# Patient Record
Sex: Male | Born: 1950
Health system: Southern US, Community
[De-identification: ages and names within clinical notes are randomized; demographics above are authoritative.]

## PROBLEM LIST (undated history)

## (undated) DIAGNOSIS — I890 Lymphedema, not elsewhere classified: Secondary | ICD-10-CM

## (undated) DIAGNOSIS — N189 Chronic kidney disease, unspecified: Secondary | ICD-10-CM

## (undated) DIAGNOSIS — J45909 Unspecified asthma, uncomplicated: Secondary | ICD-10-CM

## (undated) DIAGNOSIS — N289 Disorder of kidney and ureter, unspecified: Secondary | ICD-10-CM

## (undated) DIAGNOSIS — Z8719 Personal history of other diseases of the digestive system: Secondary | ICD-10-CM

## (undated) DIAGNOSIS — I739 Peripheral vascular disease, unspecified: Secondary | ICD-10-CM

## (undated) DIAGNOSIS — I499 Cardiac arrhythmia, unspecified: Secondary | ICD-10-CM

## (undated) DIAGNOSIS — I10 Essential (primary) hypertension: Secondary | ICD-10-CM

## (undated) DIAGNOSIS — J90 Pleural effusion, not elsewhere classified: Secondary | ICD-10-CM

## (undated) DIAGNOSIS — I5032 Chronic diastolic (congestive) heart failure: Secondary | ICD-10-CM

## (undated) DIAGNOSIS — K219 Gastro-esophageal reflux disease without esophagitis: Secondary | ICD-10-CM

## (undated) DIAGNOSIS — Z87442 Personal history of urinary calculi: Secondary | ICD-10-CM

## (undated) DIAGNOSIS — K429 Umbilical hernia without obstruction or gangrene: Secondary | ICD-10-CM

## (undated) DIAGNOSIS — M1990 Unspecified osteoarthritis, unspecified site: Secondary | ICD-10-CM

## (undated) DIAGNOSIS — I872 Venous insufficiency (chronic) (peripheral): Secondary | ICD-10-CM

## (undated) DIAGNOSIS — N3020 Other chronic cystitis without hematuria: Secondary | ICD-10-CM

## (undated) DIAGNOSIS — E785 Hyperlipidemia, unspecified: Secondary | ICD-10-CM

## (undated) HISTORY — DX: Umbilical hernia without obstruction or gangrene: K42.9

## (undated) HISTORY — DX: Essential (primary) hypertension: I10

## (undated) HISTORY — DX: Other chronic cystitis without hematuria: N30.20

## (undated) HISTORY — DX: Peripheral vascular disease, unspecified: I73.9

## (undated) HISTORY — PX: COLONOSCOPY: SHX174

## (undated) HISTORY — DX: Personal history of urinary calculi: Z87.442

## (undated) HISTORY — DX: Hyperlipidemia, unspecified: E78.5

## (undated) HISTORY — DX: Venous insufficiency (chronic) (peripheral): I87.2

## (undated) HISTORY — DX: Chronic diastolic (congestive) heart failure: I50.32

## (undated) HISTORY — DX: Chronic kidney disease, unspecified: N18.9

## (undated) HISTORY — PX: HERNIA REPAIR: SHX51

## (undated) HISTORY — DX: Lymphedema, not elsewhere classified: I89.0

## (undated) HISTORY — PX: OTHER SURGICAL HISTORY: SHX169

## (undated) HISTORY — PX: UMBILICAL HERNIA REPAIR: SHX196

## (undated) HISTORY — DX: Unspecified osteoarthritis, unspecified site: M19.90

## (undated) HISTORY — DX: Morbid (severe) obesity due to excess calories: E66.01

## (undated) HISTORY — PX: AMPUTATION TOE: SHX6595

## (undated) HISTORY — PX: EYE SURGERY: SHX253

## (undated) HISTORY — PX: AMPUTATION: SHX166

---

## 2002-06-01 ENCOUNTER — Emergency Department (HOSPITAL_COMMUNITY): Admission: EM | Admit: 2002-06-01 | Discharge: 2002-06-01 | Payer: Self-pay | Admitting: Emergency Medicine

## 2004-12-29 ENCOUNTER — Ambulatory Visit: Payer: Self-pay | Admitting: Sports Medicine

## 2004-12-29 ENCOUNTER — Inpatient Hospital Stay (HOSPITAL_COMMUNITY): Admission: EM | Admit: 2004-12-29 | Discharge: 2005-01-05 | Payer: Self-pay | Admitting: Emergency Medicine

## 2005-01-06 ENCOUNTER — Encounter (HOSPITAL_COMMUNITY): Admission: RE | Admit: 2005-01-06 | Discharge: 2005-04-06 | Payer: Self-pay | Admitting: Internal Medicine

## 2005-01-10 ENCOUNTER — Ambulatory Visit: Payer: Self-pay | Admitting: Sports Medicine

## 2005-02-01 ENCOUNTER — Ambulatory Visit: Payer: Self-pay | Admitting: Sports Medicine

## 2005-02-17 ENCOUNTER — Ambulatory Visit: Payer: Self-pay | Admitting: Family Medicine

## 2005-03-02 ENCOUNTER — Encounter (HOSPITAL_BASED_OUTPATIENT_CLINIC_OR_DEPARTMENT_OTHER): Admission: RE | Admit: 2005-03-02 | Discharge: 2005-03-11 | Payer: Self-pay | Admitting: Internal Medicine

## 2005-03-09 ENCOUNTER — Ambulatory Visit (HOSPITAL_COMMUNITY): Admission: RE | Admit: 2005-03-09 | Discharge: 2005-03-09 | Payer: Self-pay | Admitting: Specialist

## 2005-03-14 ENCOUNTER — Ambulatory Visit: Payer: Self-pay | Admitting: Sports Medicine

## 2005-04-12 ENCOUNTER — Encounter (HOSPITAL_BASED_OUTPATIENT_CLINIC_OR_DEPARTMENT_OTHER): Admission: RE | Admit: 2005-04-12 | Discharge: 2005-07-11 | Payer: Self-pay | Admitting: Surgery

## 2005-04-19 ENCOUNTER — Ambulatory Visit: Payer: Self-pay | Admitting: Sports Medicine

## 2005-04-20 ENCOUNTER — Inpatient Hospital Stay (HOSPITAL_COMMUNITY): Admission: RE | Admit: 2005-04-20 | Discharge: 2005-04-22 | Payer: Self-pay | Admitting: Specialist

## 2005-04-20 ENCOUNTER — Encounter (INDEPENDENT_AMBULATORY_CARE_PROVIDER_SITE_OTHER): Payer: Self-pay | Admitting: Specialist

## 2005-04-20 ENCOUNTER — Ambulatory Visit: Payer: Self-pay | Admitting: Infectious Diseases

## 2005-05-17 ENCOUNTER — Ambulatory Visit (HOSPITAL_COMMUNITY): Admission: RE | Admit: 2005-05-17 | Discharge: 2005-05-17 | Payer: Self-pay | Admitting: Vascular Surgery

## 2005-05-23 ENCOUNTER — Encounter (HOSPITAL_COMMUNITY): Admission: RE | Admit: 2005-05-23 | Discharge: 2005-08-21 | Payer: Self-pay | Admitting: Cardiovascular Disease

## 2005-05-24 ENCOUNTER — Ambulatory Visit: Payer: Self-pay | Admitting: Sports Medicine

## 2005-05-26 ENCOUNTER — Inpatient Hospital Stay (HOSPITAL_COMMUNITY): Admission: RE | Admit: 2005-05-26 | Discharge: 2005-06-02 | Payer: Self-pay | Admitting: Vascular Surgery

## 2005-07-05 ENCOUNTER — Ambulatory Visit: Payer: Self-pay | Admitting: Family Medicine

## 2005-08-01 ENCOUNTER — Ambulatory Visit: Payer: Self-pay | Admitting: Sports Medicine

## 2005-09-20 ENCOUNTER — Ambulatory Visit (HOSPITAL_COMMUNITY): Admission: RE | Admit: 2005-09-20 | Discharge: 2005-09-20 | Payer: Self-pay | Admitting: Vascular Surgery

## 2005-11-07 ENCOUNTER — Ambulatory Visit: Payer: Self-pay | Admitting: Sports Medicine

## 2005-12-07 ENCOUNTER — Ambulatory Visit: Payer: Self-pay | Admitting: Family Medicine

## 2006-03-01 ENCOUNTER — Ambulatory Visit: Payer: Self-pay | Admitting: Family Medicine

## 2006-03-02 ENCOUNTER — Ambulatory Visit (HOSPITAL_COMMUNITY): Admission: RE | Admit: 2006-03-02 | Discharge: 2006-03-02 | Payer: Self-pay | Admitting: Family Medicine

## 2006-03-02 ENCOUNTER — Encounter (INDEPENDENT_AMBULATORY_CARE_PROVIDER_SITE_OTHER): Payer: Self-pay | Admitting: *Deleted

## 2006-04-03 ENCOUNTER — Ambulatory Visit: Payer: Self-pay | Admitting: Family Medicine

## 2006-04-04 ENCOUNTER — Ambulatory Visit: Payer: Self-pay | Admitting: Family Medicine

## 2006-05-02 ENCOUNTER — Ambulatory Visit: Payer: Self-pay | Admitting: Sports Medicine

## 2006-07-06 ENCOUNTER — Ambulatory Visit: Payer: Self-pay | Admitting: Sports Medicine

## 2006-08-03 ENCOUNTER — Ambulatory Visit: Payer: Self-pay | Admitting: Family Medicine

## 2007-02-08 DIAGNOSIS — Z6835 Body mass index (BMI) 35.0-35.9, adult: Secondary | ICD-10-CM

## 2007-02-08 DIAGNOSIS — K429 Umbilical hernia without obstruction or gangrene: Secondary | ICD-10-CM | POA: Insufficient documentation

## 2007-02-08 DIAGNOSIS — I70219 Atherosclerosis of native arteries of extremities with intermittent claudication, unspecified extremity: Secondary | ICD-10-CM

## 2007-02-08 DIAGNOSIS — M869 Osteomyelitis, unspecified: Secondary | ICD-10-CM | POA: Insufficient documentation

## 2007-02-08 DIAGNOSIS — E66812 Obesity, class 2: Secondary | ICD-10-CM | POA: Insufficient documentation

## 2007-02-08 DIAGNOSIS — I10 Essential (primary) hypertension: Secondary | ICD-10-CM

## 2007-02-08 DIAGNOSIS — E118 Type 2 diabetes mellitus with unspecified complications: Secondary | ICD-10-CM

## 2007-02-08 HISTORY — DX: Umbilical hernia without obstruction or gangrene: K42.9

## 2007-02-23 ENCOUNTER — Telehealth: Payer: Self-pay | Admitting: *Deleted

## 2007-03-19 ENCOUNTER — Ambulatory Visit: Payer: Self-pay | Admitting: Family Medicine

## 2007-03-19 DIAGNOSIS — E785 Hyperlipidemia, unspecified: Secondary | ICD-10-CM

## 2007-03-23 ENCOUNTER — Telehealth (INDEPENDENT_AMBULATORY_CARE_PROVIDER_SITE_OTHER): Payer: Self-pay | Admitting: Family Medicine

## 2007-04-13 ENCOUNTER — Ambulatory Visit: Payer: Self-pay | Admitting: Family Medicine

## 2007-04-13 ENCOUNTER — Encounter (INDEPENDENT_AMBULATORY_CARE_PROVIDER_SITE_OTHER): Payer: Self-pay | Admitting: Family Medicine

## 2007-04-13 DIAGNOSIS — I872 Venous insufficiency (chronic) (peripheral): Secondary | ICD-10-CM | POA: Insufficient documentation

## 2007-04-13 LAB — CONVERTED CEMR LAB
Bilirubin Urine: NEGATIVE
Calcium: 9.3 mg/dL (ref 8.4–10.5)
Ketones, urine, test strip: NEGATIVE
Nitrite: NEGATIVE
Pro B Natriuretic peptide (BNP): 21 pg/mL (ref 0.0–100.0)
Protein, U semiquant: NEGATIVE
Sodium: 140 meq/L (ref 135–145)
Urobilinogen, UA: 0.2

## 2007-04-17 ENCOUNTER — Telehealth: Payer: Self-pay | Admitting: *Deleted

## 2007-04-18 ENCOUNTER — Ambulatory Visit (HOSPITAL_COMMUNITY): Admission: RE | Admit: 2007-04-18 | Discharge: 2007-04-18 | Payer: Self-pay | Admitting: Sports Medicine

## 2007-04-18 ENCOUNTER — Encounter: Payer: Self-pay | Admitting: Cardiology

## 2007-04-18 ENCOUNTER — Ambulatory Visit: Payer: Self-pay | Admitting: Cardiology

## 2007-05-15 ENCOUNTER — Ambulatory Visit (HOSPITAL_BASED_OUTPATIENT_CLINIC_OR_DEPARTMENT_OTHER): Admission: RE | Admit: 2007-05-15 | Discharge: 2007-05-15 | Payer: Self-pay | Admitting: Urology

## 2007-05-18 ENCOUNTER — Encounter: Admission: RE | Admit: 2007-05-18 | Discharge: 2007-05-18 | Payer: Self-pay | Admitting: Sports Medicine

## 2007-05-18 ENCOUNTER — Ambulatory Visit: Payer: Self-pay | Admitting: Sports Medicine

## 2007-05-18 ENCOUNTER — Encounter (INDEPENDENT_AMBULATORY_CARE_PROVIDER_SITE_OTHER): Payer: Self-pay | Admitting: Family Medicine

## 2007-05-18 LAB — CONVERTED CEMR LAB
Calcium, Total (PTH): 9 mg/dL (ref 8.4–10.5)
Chloride: 107 meq/L (ref 96–112)
Magnesium: 1.9 mg/dL (ref 1.5–2.5)
PTH: 47.5 pg/mL (ref 14.0–72.0)
Potassium: 4.3 meq/L (ref 3.5–5.3)
Sodium: 142 meq/L (ref 135–145)

## 2007-05-21 ENCOUNTER — Encounter (INDEPENDENT_AMBULATORY_CARE_PROVIDER_SITE_OTHER): Payer: Self-pay | Admitting: Family Medicine

## 2007-05-21 LAB — CONVERTED CEMR LAB
Collection Interval-CRCL: 24 hr
Creatinine, Urine: 137.2 mg/dL

## 2007-06-01 ENCOUNTER — Encounter (INDEPENDENT_AMBULATORY_CARE_PROVIDER_SITE_OTHER): Payer: Self-pay | Admitting: Family Medicine

## 2007-06-11 ENCOUNTER — Ambulatory Visit: Payer: Self-pay | Admitting: *Deleted

## 2007-06-21 ENCOUNTER — Ambulatory Visit: Payer: Self-pay | Admitting: Vascular Surgery

## 2007-09-18 ENCOUNTER — Encounter (INDEPENDENT_AMBULATORY_CARE_PROVIDER_SITE_OTHER): Payer: Self-pay | Admitting: Family Medicine

## 2007-09-27 ENCOUNTER — Encounter (INDEPENDENT_AMBULATORY_CARE_PROVIDER_SITE_OTHER): Payer: Self-pay | Admitting: Family Medicine

## 2007-11-28 ENCOUNTER — Ambulatory Visit: Payer: Self-pay | Admitting: Vascular Surgery

## 2008-02-11 ENCOUNTER — Ambulatory Visit: Payer: Self-pay | Admitting: Vascular Surgery

## 2008-05-06 ENCOUNTER — Encounter: Payer: Self-pay | Admitting: *Deleted

## 2008-05-14 ENCOUNTER — Ambulatory Visit: Payer: Self-pay | Admitting: Vascular Surgery

## 2008-05-21 ENCOUNTER — Ambulatory Visit: Payer: Self-pay | Admitting: Family Medicine

## 2008-05-21 ENCOUNTER — Ambulatory Visit: Payer: Self-pay | Admitting: Vascular Surgery

## 2008-05-21 ENCOUNTER — Encounter: Payer: Self-pay | Admitting: Family Medicine

## 2008-05-21 LAB — CONVERTED CEMR LAB: Hgb A1c MFr Bld: 8 %

## 2008-06-02 ENCOUNTER — Encounter: Admission: RE | Admit: 2008-06-02 | Discharge: 2008-06-02 | Payer: Self-pay | Admitting: Family Medicine

## 2008-06-05 ENCOUNTER — Encounter: Payer: Self-pay | Admitting: Family Medicine

## 2008-06-09 ENCOUNTER — Encounter: Payer: Self-pay | Admitting: Family Medicine

## 2008-06-09 DIAGNOSIS — N186 End stage renal disease: Secondary | ICD-10-CM | POA: Insufficient documentation

## 2008-06-09 DIAGNOSIS — Z992 Dependence on renal dialysis: Secondary | ICD-10-CM

## 2008-06-09 LAB — CONVERTED CEMR LAB
Albumin: 3.8 g/dL (ref 3.5–5.2)
Alkaline Phosphatase: 79 units/L (ref 39–117)
CO2: 23 meq/L (ref 19–32)
Chloride: 102 meq/L (ref 96–112)
Glucose, Bld: 333 mg/dL — ABNORMAL HIGH (ref 70–99)
LDL Cholesterol: 92 mg/dL (ref 0–99)
Potassium: 4.3 meq/L (ref 3.5–5.3)
Sodium: 135 meq/L (ref 135–145)
Total Protein: 7.9 g/dL (ref 6.0–8.3)
Triglycerides: 247 mg/dL — ABNORMAL HIGH (ref <150)

## 2008-07-03 ENCOUNTER — Telehealth: Payer: Self-pay | Admitting: *Deleted

## 2008-07-10 ENCOUNTER — Encounter: Payer: Self-pay | Admitting: Family Medicine

## 2008-08-05 ENCOUNTER — Telehealth: Payer: Self-pay | Admitting: *Deleted

## 2008-08-27 ENCOUNTER — Ambulatory Visit: Payer: Self-pay | Admitting: Vascular Surgery

## 2008-09-05 ENCOUNTER — Ambulatory Visit (HOSPITAL_COMMUNITY): Admission: RE | Admit: 2008-09-05 | Discharge: 2008-09-05 | Payer: Self-pay | Admitting: Vascular Surgery

## 2008-09-05 ENCOUNTER — Ambulatory Visit: Payer: Self-pay | Admitting: Vascular Surgery

## 2008-09-25 ENCOUNTER — Encounter: Payer: Self-pay | Admitting: Family Medicine

## 2008-10-06 ENCOUNTER — Ambulatory Visit: Payer: Self-pay | Admitting: Hematology and Oncology

## 2008-10-10 ENCOUNTER — Ambulatory Visit: Payer: Self-pay | Admitting: Family Medicine

## 2008-10-10 LAB — CONVERTED CEMR LAB: Hgb A1c MFr Bld: 8.3 %

## 2008-10-16 ENCOUNTER — Encounter: Payer: Self-pay | Admitting: Family Medicine

## 2008-10-16 LAB — CBC WITH DIFFERENTIAL/PLATELET
BASO%: 0.6 % (ref 0.0–2.0)
HCT: 37.8 % — ABNORMAL LOW (ref 38.7–49.9)
HGB: 13 g/dL (ref 13.0–17.1)
MCHC: 34.4 g/dL (ref 32.0–35.9)
MONO#: 0.4 10*3/uL (ref 0.1–0.9)
NEUT%: 70.6 % (ref 40.0–75.0)
RDW: 14.4 % (ref 11.2–14.6)
WBC: 11.3 10*3/uL — ABNORMAL HIGH (ref 4.0–10.0)
lymph#: 2.5 10*3/uL (ref 0.9–3.3)

## 2008-10-20 LAB — COMPREHENSIVE METABOLIC PANEL
ALT: 20 U/L (ref 0–53)
AST: 25 U/L (ref 0–37)
Albumin: 4 g/dL (ref 3.5–5.2)
BUN: 27 mg/dL — ABNORMAL HIGH (ref 6–23)
Calcium: 9.4 mg/dL (ref 8.4–10.5)
Chloride: 100 mEq/L (ref 96–112)
Potassium: 3.9 mEq/L (ref 3.5–5.3)
Sodium: 136 mEq/L (ref 135–145)
Total Protein: 8.1 g/dL (ref 6.0–8.3)

## 2008-10-20 LAB — IFE INTERPRETATION

## 2008-10-20 LAB — PROTEIN ELECTROPHORESIS, SERUM, WITH REFLEX
Albumin ELP: 49.2 % — ABNORMAL LOW (ref 55.8–66.1)
Alpha-1-Globulin: 4.1 % (ref 2.9–4.9)
Alpha-2-Globulin: 9.1 % (ref 7.1–11.8)
Beta 2: 8 % — ABNORMAL HIGH (ref 3.2–6.5)

## 2008-10-20 LAB — IGG, IGA, IGM: IgA: 554 mg/dL — ABNORMAL HIGH (ref 68–378)

## 2008-10-21 ENCOUNTER — Ambulatory Visit (HOSPITAL_COMMUNITY): Admission: RE | Admit: 2008-10-21 | Discharge: 2008-10-21 | Payer: Self-pay | Admitting: Hematology and Oncology

## 2008-10-23 LAB — UIFE/LIGHT CHAINS/TP QN, 24-HR UR
Alpha 1, Urine: DETECTED — AB
Free Kappa Lt Chains,Ur: 4.55 mg/dL — ABNORMAL HIGH (ref 0.04–1.51)
Free Kappa/Lambda Ratio: 6.07 ratio — ABNORMAL HIGH (ref 0.46–4.00)
Free Lambda Excretion/Day: 18.94 mg/d
Free Lt Chn Excr Rate: 114.89 mg/d
Time: 24 hours
Total Protein, Urine: 25.5 mg/dL

## 2008-10-27 ENCOUNTER — Encounter: Payer: Self-pay | Admitting: Family Medicine

## 2008-10-29 ENCOUNTER — Ambulatory Visit (HOSPITAL_COMMUNITY): Admission: RE | Admit: 2008-10-29 | Discharge: 2008-10-29 | Payer: Self-pay | Admitting: Hematology and Oncology

## 2008-10-29 ENCOUNTER — Encounter (INDEPENDENT_AMBULATORY_CARE_PROVIDER_SITE_OTHER): Payer: Self-pay | Admitting: Diagnostic Radiology

## 2008-11-21 ENCOUNTER — Ambulatory Visit: Payer: Self-pay | Admitting: Hematology and Oncology

## 2008-11-21 ENCOUNTER — Encounter: Payer: Self-pay | Admitting: Family Medicine

## 2008-11-21 LAB — CBC WITH DIFFERENTIAL/PLATELET
BASO%: 0.4 % (ref 0.0–2.0)
Basophils Absolute: 0 10*3/uL (ref 0.0–0.1)
HCT: 37.2 % — ABNORMAL LOW (ref 38.7–49.9)
HGB: 12.5 g/dL — ABNORMAL LOW (ref 13.0–17.1)
MONO#: 0.6 10*3/uL (ref 0.1–0.9)
NEUT#: 6.2 10*3/uL (ref 1.5–6.5)
NEUT%: 63.6 % (ref 40.0–75.0)
WBC: 9.8 10*3/uL (ref 4.0–10.0)
lymph#: 2.5 10*3/uL (ref 0.9–3.3)

## 2008-11-24 LAB — KAPPA/LAMBDA LIGHT CHAINS: Kappa:Lambda Ratio: 2.04 — ABNORMAL HIGH (ref 0.26–1.65)

## 2008-12-10 ENCOUNTER — Encounter: Payer: Self-pay | Admitting: Family Medicine

## 2008-12-24 ENCOUNTER — Ambulatory Visit: Payer: Self-pay | Admitting: Family Medicine

## 2009-02-13 ENCOUNTER — Encounter: Payer: Self-pay | Admitting: Family Medicine

## 2009-03-23 ENCOUNTER — Ambulatory Visit: Payer: Self-pay | Admitting: Hematology and Oncology

## 2009-03-25 LAB — COMPREHENSIVE METABOLIC PANEL
AST: 21 U/L (ref 0–37)
Albumin: 3.3 g/dL — ABNORMAL LOW (ref 3.5–5.2)
Alkaline Phosphatase: 87 U/L (ref 39–117)
Glucose, Bld: 158 mg/dL — ABNORMAL HIGH (ref 70–99)
Potassium: 4.1 mEq/L (ref 3.5–5.3)
Sodium: 139 mEq/L (ref 135–145)
Total Protein: 8 g/dL (ref 6.0–8.3)

## 2009-03-25 LAB — CBC WITH DIFFERENTIAL/PLATELET
Eosinophils Absolute: 0.4 10*3/uL (ref 0.0–0.5)
MCV: 83.3 fL (ref 79.3–98.0)
MONO%: 5.2 % (ref 0.0–14.0)
NEUT#: 5.7 10*3/uL (ref 1.5–6.5)
RBC: 4.2 10*6/uL (ref 4.20–5.82)
RDW: 14.6 % (ref 11.0–14.6)
WBC: 9 10*3/uL (ref 4.0–10.3)
lymph#: 2.4 10*3/uL (ref 0.9–3.3)

## 2009-03-27 LAB — SPEP & IFE WITH QIG
Albumin ELP: 48.8 % — ABNORMAL LOW (ref 55.8–66.1)
Alpha-1-Globulin: 4.3 % (ref 2.9–4.9)
IgM, Serum: 118 mg/dL (ref 60–263)

## 2009-03-27 LAB — KAPPA/LAMBDA LIGHT CHAINS: Lambda Free Lght Chn: 2.21 mg/dL (ref 0.57–2.63)

## 2009-04-02 ENCOUNTER — Telehealth (INDEPENDENT_AMBULATORY_CARE_PROVIDER_SITE_OTHER): Payer: Self-pay | Admitting: *Deleted

## 2009-04-03 ENCOUNTER — Encounter: Payer: Self-pay | Admitting: Family Medicine

## 2009-05-06 ENCOUNTER — Ambulatory Visit: Payer: Self-pay | Admitting: Family Medicine

## 2009-05-06 DIAGNOSIS — R0989 Other specified symptoms and signs involving the circulatory and respiratory systems: Secondary | ICD-10-CM

## 2009-05-06 DIAGNOSIS — I890 Lymphedema, not elsewhere classified: Secondary | ICD-10-CM | POA: Insufficient documentation

## 2009-05-06 LAB — CONVERTED CEMR LAB: Hgb A1c MFr Bld: 8.8 %

## 2009-05-20 ENCOUNTER — Encounter: Payer: Self-pay | Admitting: Family Medicine

## 2009-05-20 ENCOUNTER — Ambulatory Visit: Payer: Self-pay | Admitting: Family Medicine

## 2009-05-21 LAB — CONVERTED CEMR LAB
Chloride: 104 meq/L (ref 96–112)
Potassium: 4.5 meq/L (ref 3.5–5.3)

## 2009-05-26 ENCOUNTER — Encounter (HOSPITAL_BASED_OUTPATIENT_CLINIC_OR_DEPARTMENT_OTHER): Admission: RE | Admit: 2009-05-26 | Discharge: 2009-06-23 | Payer: Self-pay | Admitting: General Surgery

## 2009-05-27 ENCOUNTER — Encounter (INDEPENDENT_AMBULATORY_CARE_PROVIDER_SITE_OTHER): Payer: Self-pay | Admitting: *Deleted

## 2009-05-28 ENCOUNTER — Encounter: Payer: Self-pay | Admitting: Family Medicine

## 2009-05-28 ENCOUNTER — Ambulatory Visit: Payer: Self-pay | Admitting: Vascular Surgery

## 2009-05-28 ENCOUNTER — Ambulatory Visit (HOSPITAL_COMMUNITY): Admission: RE | Admit: 2009-05-28 | Discharge: 2009-05-28 | Payer: Self-pay | Admitting: Family Medicine

## 2009-06-02 ENCOUNTER — Encounter: Payer: Self-pay | Admitting: *Deleted

## 2009-06-04 ENCOUNTER — Encounter: Admission: RE | Admit: 2009-06-04 | Discharge: 2009-06-04 | Payer: Self-pay | Admitting: Family Medicine

## 2009-06-04 ENCOUNTER — Encounter: Payer: Self-pay | Admitting: Family Medicine

## 2009-06-05 ENCOUNTER — Encounter: Payer: Self-pay | Admitting: Family Medicine

## 2009-06-09 ENCOUNTER — Ambulatory Visit: Payer: Self-pay | Admitting: Family Medicine

## 2009-06-12 ENCOUNTER — Encounter: Payer: Self-pay | Admitting: Family Medicine

## 2009-07-17 ENCOUNTER — Ambulatory Visit: Payer: Self-pay | Admitting: Family Medicine

## 2009-07-17 DIAGNOSIS — L608 Other nail disorders: Secondary | ICD-10-CM | POA: Insufficient documentation

## 2009-07-17 DIAGNOSIS — B351 Tinea unguium: Secondary | ICD-10-CM

## 2009-08-14 ENCOUNTER — Ambulatory Visit: Payer: Self-pay | Admitting: Family Medicine

## 2009-08-19 ENCOUNTER — Encounter: Payer: Self-pay | Admitting: Family Medicine

## 2009-08-19 ENCOUNTER — Ambulatory Visit: Payer: Self-pay | Admitting: Family Medicine

## 2009-08-19 LAB — CONVERTED CEMR LAB
Albumin: 3.8 g/dL (ref 3.5–5.2)
Alkaline Phosphatase: 73 units/L (ref 39–117)
CO2: 24 meq/L (ref 19–32)
Calcium: 8.7 mg/dL (ref 8.4–10.5)
Chloride: 103 meq/L (ref 96–112)
Cholesterol: 180 mg/dL (ref 0–200)
Glucose, Bld: 173 mg/dL — ABNORMAL HIGH (ref 70–99)
LDL Cholesterol: 112 mg/dL — ABNORMAL HIGH (ref 0–99)
Potassium: 4.5 meq/L (ref 3.5–5.3)
Sodium: 138 meq/L (ref 135–145)
Total Protein: 7.8 g/dL (ref 6.0–8.3)
Triglycerides: 202 mg/dL — ABNORMAL HIGH (ref <150)

## 2009-09-14 ENCOUNTER — Encounter: Payer: Self-pay | Admitting: Family Medicine

## 2009-09-24 ENCOUNTER — Ambulatory Visit: Payer: Self-pay | Admitting: Family Medicine

## 2009-10-20 ENCOUNTER — Ambulatory Visit: Payer: Self-pay | Admitting: Hematology and Oncology

## 2009-10-30 ENCOUNTER — Ambulatory Visit (HOSPITAL_COMMUNITY): Admission: RE | Admit: 2009-10-30 | Discharge: 2009-10-30 | Payer: Self-pay | Admitting: Hematology and Oncology

## 2009-10-30 LAB — CBC WITH DIFFERENTIAL/PLATELET
BASO%: 0.2 % (ref 0.0–2.0)
EOS%: 6.2 % (ref 0.0–7.0)
HCT: 33.4 % — ABNORMAL LOW (ref 38.4–49.9)
LYMPH%: 22 % (ref 14.0–49.0)
MCH: 28.3 pg (ref 27.2–33.4)
MCHC: 32.9 g/dL (ref 32.0–36.0)
NEUT%: 66.5 % (ref 39.0–75.0)
Platelets: 199 10*3/uL (ref 140–400)
RBC: 3.89 10*6/uL — ABNORMAL LOW (ref 4.20–5.82)
lymph#: 2.2 10*3/uL (ref 0.9–3.3)

## 2009-11-03 LAB — COMPREHENSIVE METABOLIC PANEL
ALT: 14 U/L (ref 0–53)
AST: 13 U/L (ref 0–37)
Alkaline Phosphatase: 73 U/L (ref 39–117)
Creatinine, Ser: 2.04 mg/dL — ABNORMAL HIGH (ref 0.40–1.50)
Sodium: 136 mEq/L (ref 135–145)
Total Bilirubin: 0.3 mg/dL (ref 0.3–1.2)
Total Protein: 8.4 g/dL — ABNORMAL HIGH (ref 6.0–8.3)

## 2009-11-03 LAB — SPEP & IFE WITH QIG
Beta Globulin: 8.1 % — ABNORMAL HIGH (ref 4.7–7.2)
IgA: 590 mg/dL — ABNORMAL HIGH (ref 68–378)
IgG (Immunoglobin G), Serum: 2180 mg/dL — ABNORMAL HIGH (ref 694–1618)
IgM, Serum: 117 mg/dL (ref 60–263)
M-Spike, %: 0.93 g/dL
Total Protein, Serum Electrophoresis: 8.4 g/dL — ABNORMAL HIGH (ref 6.0–8.3)

## 2009-11-25 ENCOUNTER — Encounter: Payer: Self-pay | Admitting: Family Medicine

## 2009-12-02 ENCOUNTER — Encounter: Payer: Self-pay | Admitting: Family Medicine

## 2009-12-02 DIAGNOSIS — Z87442 Personal history of urinary calculi: Secondary | ICD-10-CM

## 2009-12-02 DIAGNOSIS — N3020 Other chronic cystitis without hematuria: Secondary | ICD-10-CM

## 2009-12-02 DIAGNOSIS — N529 Male erectile dysfunction, unspecified: Secondary | ICD-10-CM

## 2009-12-02 HISTORY — DX: Personal history of urinary calculi: Z87.442

## 2009-12-23 ENCOUNTER — Ambulatory Visit: Payer: Self-pay | Admitting: Family Medicine

## 2009-12-23 ENCOUNTER — Telehealth (INDEPENDENT_AMBULATORY_CARE_PROVIDER_SITE_OTHER): Payer: Self-pay | Admitting: *Deleted

## 2009-12-23 DIAGNOSIS — L97909 Non-pressure chronic ulcer of unspecified part of unspecified lower leg with unspecified severity: Secondary | ICD-10-CM | POA: Insufficient documentation

## 2009-12-24 ENCOUNTER — Encounter: Payer: Self-pay | Admitting: Family Medicine

## 2009-12-28 ENCOUNTER — Telehealth: Payer: Self-pay | Admitting: Family Medicine

## 2009-12-30 ENCOUNTER — Ambulatory Visit: Payer: Self-pay | Admitting: Family Medicine

## 2010-01-01 ENCOUNTER — Encounter (HOSPITAL_BASED_OUTPATIENT_CLINIC_OR_DEPARTMENT_OTHER): Admission: RE | Admit: 2010-01-01 | Discharge: 2010-01-19 | Payer: Self-pay | Admitting: General Surgery

## 2010-01-07 ENCOUNTER — Telehealth: Payer: Self-pay | Admitting: Family Medicine

## 2010-02-12 ENCOUNTER — Telehealth: Payer: Self-pay | Admitting: Family Medicine

## 2010-02-18 ENCOUNTER — Ambulatory Visit: Payer: Self-pay | Admitting: Hematology and Oncology

## 2010-02-22 LAB — CBC WITH DIFFERENTIAL/PLATELET
Basophils Absolute: 0 10*3/uL (ref 0.0–0.1)
Eosinophils Absolute: 0.3 10*3/uL (ref 0.0–0.5)
HCT: 29.7 % — ABNORMAL LOW (ref 38.4–49.9)
HGB: 10.1 g/dL — ABNORMAL LOW (ref 13.0–17.1)
LYMPH%: 19.1 % (ref 14.0–49.0)
MCV: 84.3 fL (ref 79.3–98.0)
MONO#: 0.5 10*3/uL (ref 0.1–0.9)
MONO%: 4.6 % (ref 0.0–14.0)
NEUT#: 8.7 10*3/uL — ABNORMAL HIGH (ref 1.5–6.5)
NEUT%: 73.1 % (ref 39.0–75.0)
Platelets: 193 10*3/uL (ref 140–400)

## 2010-02-22 LAB — COMPREHENSIVE METABOLIC PANEL
Alkaline Phosphatase: 73 U/L (ref 39–117)
BUN: 31 mg/dL — ABNORMAL HIGH (ref 6–23)
CO2: 26 mEq/L (ref 19–32)
Glucose, Bld: 237 mg/dL — ABNORMAL HIGH (ref 70–99)
Total Bilirubin: 0.3 mg/dL (ref 0.3–1.2)

## 2010-02-24 LAB — SPEP & IFE WITH QIG
Alpha-1-Globulin: 5.7 % — ABNORMAL HIGH (ref 2.9–4.9)
Beta 2: 6.6 % — ABNORMAL HIGH (ref 3.2–6.5)
Beta Globulin: 7.8 % — ABNORMAL HIGH (ref 4.7–7.2)
Gamma Globulin: 24.7 % — ABNORMAL HIGH (ref 11.1–18.8)
IgA: 488 mg/dL — ABNORMAL HIGH (ref 68–378)
IgM, Serum: 99 mg/dL (ref 60–263)

## 2010-02-24 LAB — KAPPA/LAMBDA LIGHT CHAINS
Kappa free light chain: 5.27 mg/dL — ABNORMAL HIGH (ref 0.33–1.94)
Kappa:Lambda Ratio: 1.51 (ref 0.26–1.65)
Lambda Free Lght Chn: 3.49 mg/dL — ABNORMAL HIGH (ref 0.57–2.63)

## 2010-03-24 ENCOUNTER — Encounter: Payer: Self-pay | Admitting: Family Medicine

## 2010-03-24 ENCOUNTER — Ambulatory Visit: Payer: Self-pay | Admitting: Family Medicine

## 2010-03-26 ENCOUNTER — Encounter: Payer: Self-pay | Admitting: Family Medicine

## 2010-03-29 ENCOUNTER — Encounter: Payer: Self-pay | Admitting: Family Medicine

## 2010-03-29 ENCOUNTER — Encounter (HOSPITAL_BASED_OUTPATIENT_CLINIC_OR_DEPARTMENT_OTHER): Admission: RE | Admit: 2010-03-29 | Discharge: 2010-05-05 | Payer: Self-pay | Admitting: General Surgery

## 2010-04-14 ENCOUNTER — Encounter: Payer: Self-pay | Admitting: Family Medicine

## 2010-04-14 ENCOUNTER — Ambulatory Visit: Payer: Self-pay | Admitting: Family Medicine

## 2010-04-14 LAB — CONVERTED CEMR LAB
Albumin: 3.8 g/dL (ref 3.5–5.2)
Alkaline Phosphatase: 74 units/L (ref 39–117)
Glucose, Bld: 143 mg/dL — ABNORMAL HIGH (ref 70–99)
LDL Cholesterol: 83 mg/dL (ref 0–99)
Potassium: 4.3 meq/L (ref 3.5–5.3)
Sodium: 139 meq/L (ref 135–145)
Total Protein: 8.2 g/dL (ref 6.0–8.3)
Triglycerides: 177 mg/dL — ABNORMAL HIGH (ref <150)

## 2010-04-15 ENCOUNTER — Encounter: Payer: Self-pay | Admitting: Family Medicine

## 2010-04-26 ENCOUNTER — Telehealth: Payer: Self-pay | Admitting: Family Medicine

## 2010-05-06 ENCOUNTER — Ambulatory Visit: Payer: Self-pay | Admitting: Family Medicine

## 2010-05-06 DIAGNOSIS — R509 Fever, unspecified: Secondary | ICD-10-CM | POA: Insufficient documentation

## 2010-05-20 ENCOUNTER — Encounter: Payer: Self-pay | Admitting: Family Medicine

## 2010-05-21 ENCOUNTER — Encounter: Payer: Self-pay | Admitting: Family Medicine

## 2010-05-25 ENCOUNTER — Encounter (HOSPITAL_BASED_OUTPATIENT_CLINIC_OR_DEPARTMENT_OTHER): Admission: RE | Admit: 2010-05-25 | Discharge: 2010-08-23 | Payer: Self-pay | Admitting: General Surgery

## 2010-05-26 ENCOUNTER — Encounter: Payer: Self-pay | Admitting: Family Medicine

## 2010-05-29 DIAGNOSIS — N200 Calculus of kidney: Secondary | ICD-10-CM

## 2010-07-08 ENCOUNTER — Ambulatory Visit: Payer: Self-pay | Admitting: Family Medicine

## 2010-07-08 ENCOUNTER — Encounter: Payer: Self-pay | Admitting: Family Medicine

## 2010-07-08 ENCOUNTER — Encounter: Admission: RE | Admit: 2010-07-08 | Discharge: 2010-07-08 | Payer: Self-pay | Admitting: Family Medicine

## 2010-07-08 DIAGNOSIS — L03019 Cellulitis of unspecified finger: Secondary | ICD-10-CM

## 2010-07-08 DIAGNOSIS — L02519 Cutaneous abscess of unspecified hand: Secondary | ICD-10-CM

## 2010-07-08 LAB — CONVERTED CEMR LAB
CO2: 22 meq/L (ref 19–32)
Chloride: 105 meq/L (ref 96–112)
Creatinine, Ser: 1.4 mg/dL (ref 0.40–1.50)
Sodium: 140 meq/L (ref 135–145)

## 2010-07-09 ENCOUNTER — Telehealth: Payer: Self-pay | Admitting: Family Medicine

## 2010-07-14 ENCOUNTER — Telehealth: Payer: Self-pay | Admitting: Family Medicine

## 2010-07-17 ENCOUNTER — Encounter: Payer: Self-pay | Admitting: Family Medicine

## 2010-08-23 ENCOUNTER — Encounter (HOSPITAL_BASED_OUTPATIENT_CLINIC_OR_DEPARTMENT_OTHER): Admission: RE | Admit: 2010-08-23 | Discharge: 2010-10-06 | Payer: Self-pay | Admitting: General Surgery

## 2010-08-23 ENCOUNTER — Encounter: Payer: Self-pay | Admitting: Family Medicine

## 2010-08-24 ENCOUNTER — Encounter: Payer: Self-pay | Admitting: Family Medicine

## 2010-09-21 ENCOUNTER — Encounter: Payer: Self-pay | Admitting: Family Medicine

## 2010-09-30 ENCOUNTER — Encounter: Payer: Self-pay | Admitting: Family Medicine

## 2010-11-12 ENCOUNTER — Encounter: Payer: Self-pay | Admitting: Family Medicine

## 2010-11-17 ENCOUNTER — Encounter (HOSPITAL_BASED_OUTPATIENT_CLINIC_OR_DEPARTMENT_OTHER)
Admission: RE | Admit: 2010-11-17 | Discharge: 2010-12-02 | Payer: Self-pay | Source: Home / Self Care | Attending: General Surgery | Admitting: General Surgery

## 2010-12-08 ENCOUNTER — Encounter (HOSPITAL_BASED_OUTPATIENT_CLINIC_OR_DEPARTMENT_OTHER)
Admission: RE | Admit: 2010-12-08 | Discharge: 2011-01-11 | Payer: Self-pay | Source: Home / Self Care | Attending: General Surgery | Admitting: General Surgery

## 2010-12-20 ENCOUNTER — Ambulatory Visit: Admission: RE | Admit: 2010-12-20 | Payer: Self-pay | Source: Home / Self Care | Admitting: Vascular Surgery

## 2010-12-20 ENCOUNTER — Ambulatory Visit: Admit: 2010-12-20 | Payer: Self-pay | Admitting: Vascular Surgery

## 2010-12-21 ENCOUNTER — Ambulatory Visit: Admit: 2010-12-21 | Payer: Self-pay

## 2010-12-23 ENCOUNTER — Ambulatory Visit: Admit: 2010-12-23 | Payer: Self-pay | Admitting: Vascular Surgery

## 2010-12-29 LAB — BASIC METABOLIC PANEL
BUN: 18 mg/dL (ref 6–23)
CO2: 27 mEq/L (ref 19–32)
Calcium: 9 mg/dL (ref 8.4–10.5)
Chloride: 104 mEq/L (ref 96–112)
Creatinine, Ser: 1.65 mg/dL — ABNORMAL HIGH (ref 0.4–1.5)
GFR calc Af Amer: 52 mL/min — ABNORMAL LOW (ref >60)
GFR calc non Af Amer: 43 mL/min — ABNORMAL LOW (ref >60)
Glucose, Bld: 123 mg/dL — ABNORMAL HIGH (ref 70–99)
Potassium: 4.2 mEq/L (ref 3.5–5.1)
Sodium: 139 mEq/L (ref 135–145)

## 2011-01-02 ENCOUNTER — Encounter: Payer: Self-pay | Admitting: Cardiovascular Disease

## 2011-01-02 ENCOUNTER — Encounter: Payer: Self-pay | Admitting: Hematology and Oncology

## 2011-01-02 ENCOUNTER — Encounter: Payer: Self-pay | Admitting: Surgery

## 2011-01-04 ENCOUNTER — Ambulatory Visit (HOSPITAL_COMMUNITY): Admission: RE | Admit: 2011-01-04 | Payer: Self-pay | Source: Home / Self Care | Admitting: General Surgery

## 2011-01-06 ENCOUNTER — Encounter: Payer: Self-pay | Admitting: *Deleted

## 2011-01-07 ENCOUNTER — Encounter: Payer: Self-pay | Admitting: *Deleted

## 2011-01-11 NOTE — Progress Notes (Signed)
  Phone Note Outgoing Call   Call placed by: Merla Riches MD,  January 07, 2010 6:26 PM Call placed to: Patient Summary of Call: Spoke with pt about leg.  He states that it is getting better.  He is taking Cipro, is on 10-day course.  Wound Cx is sensitive to Cipro.  He has appt with wound center on 01/11/10.  Asked pt to call [facility] if not getting better.  He agreed.   Initial call taken by: Jolynda Townley MD,  January 07, 2010 6:27 PM

## 2011-01-11 NOTE — Consult Note (Signed)
Summary: [place]  [place]   Imported By: Raymond Gurney 08/30/2010 16:03:24  _____________________________________________________________________  External Attachment:    Type:   Image     Comment:   External Document

## 2011-01-11 NOTE — Miscellaneous (Signed)
Summary: Notes from [place], Dr Florene Glen  Clinical Lists Changes Pt saw Dr Florene Glen at [facility] 02/08/10.   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 (04/15/2010 16:12) Added new observation of PRIMARY MD: Amarisa Wilinski MD (04/15/2010 16:12)       Past History:  Past Medical History: Obesity HTN DM2 CKD Stage 3 of uncertain etiology nephrosclerosis, diabetes, 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

## 2011-01-11 NOTE — Consult Note (Signed)
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  Would not take meds as directed.

## 2011-01-11 NOTE — Progress Notes (Signed)
Summary: no f/u appt  Phone Note Outgoing Call   Call placed by: Dr. Francesco Sor Call placed to: Patient Action Taken: Information Sent Details for Reason: Pt was seen 7/28 for swollen and painful finger.  Was told to return to clinic today (7/29) to re-evaluate symptoms and discuss further options.  It appears the pt did not follow these instructions.  Would like pt to be seen by any provider asap to f/u on finger pain and swelling. Summary of Call: Left a message to call [facility] [place] regarding office visit on 07/08/10.  Attempted to call work number, but disconnected.

## 2011-01-11 NOTE — Progress Notes (Signed)
Summary: Rx Req  Phone Note Call from Patient Call back at Home Phone [phone]   Caller: Patient Summary of Call: Needs a rx sent to [facility] for Diabetic Shoes.  Fax # is [phone].  Pt has a fitting for them on Monday at 2 p.m. Initial call taken by: Raymond Gurney,  February 12, 2010 8:35 AM    New/Updated Medications: * DIABETIC SHOES Dispense one pair of diabetic shoes Prescriptions: DIABETIC SHOES Dispense one pair of diabetic shoes  #1 x 0   Entered and Authorized by:   Merla Riches MD   Signed by:   Merla Riches MD on 02/13/2010   Method used:   Historical   [id number]  Please print and fax to above number. Nahlia Hellmann MD  February 13, 2010 11:08 AM

## 2011-01-11 NOTE — Miscellaneous (Signed)
Summary: Per [facility]  Clinical Lists Changes Per Dr Florene Glen:  Lisinopril d/c. Carvedilol increase to 12.5mg  two times a day.    Medications: Changed medication from CARVEDILOL 6.25 MG TABS (CARVEDILOL) 1 tab by mouth two times a day to CARVEDILOL 12.5 MG TABS (CARVEDILOL) 1 tab by mouth two times a day per Dr Florene Glen

## 2011-01-11 NOTE — Miscellaneous (Signed)
Summary: Compression Stockings  Clinical Lists Changes  Called below for compression stockings for patient.    [facility] [phone]: does not carry compression stockings [facility]: does not offer them for pts with Medicaid. [facility]: not a medicaid provider

## 2011-01-11 NOTE — Consult Note (Signed)
Summary: [place]  [place] By: Raymond Gurney 08/30/2010 16:02:40  _____________________________________________________________________  External Attachment:    Type:   Image     Comment:   External Document

## 2011-01-11 NOTE — Assessment & Plan Note (Signed)
Summary: leg lesions with odor/ls   Vital Signs:  Patient profile:   60 year old male Weight:      334.2 pounds Temp:     98.0 degrees F Pulse rate:   65 / minute BP sitting:   173 / 70  (right arm)  Vitals Entered By: Geanie Cooley RN (December 23, 2009 2:43 PM) CC: LEG LESION; WITH ODOR Pain Assessment Patient in pain? no        Primary Care Provider:  Cat [name]  CC:  LEG LESION; WITH ODOR.  History of Present Illness: CC: Bilateral leg wounds, with odor  HPI: Bilateral lower extremity open leg wounds, worse on the right side for a little over a year. Have become progressivey worse, especially in the last 3 months. Foul smelling with brown pus and blood. Denies pain but says they do itch. Refered to Wound Care in June 2010, however there were no open wounds at that point so they referred him to the dermatologist. The dermatoligist gave him ointment that failed to clear it up. He denies constiutional symptoms: N/V, fever, or headaches. Had a small amount of diarrhea last night. His fasting sugars are usually in the 200s; today it was 191 (self reported).   HTN- noted elevated BP today, out of meds as well as diabetic meds x 1 1/2 weeks, assymptomatic from both ROS- no fever, chest pain, headache, urinary frequency    Seen with [facility]  [place] & Providers  Alcohol-Tobacco-Diet     Tobacco Status: never  Current Medications (verified): 1)  Bayer Childrens Aspirin 81 Mg Chew (Aspirin) .... Take 1 Tablet By Mouth Once A Day 2)  Glucotrol 10 Mg Tabs (Glipizide) .... Take 1 Tablet By Mouth Before Breakfast and Before Dinner 3)  Norvasc 10 Mg Tabs (Amlodipine Besylate) .... Take 1 Tablet By Mouth Once A Day. 4)  Actos 45 Mg Tabs (Pioglitazone Hcl) .[name].. 1 Tab By Mouth Daily 5)  Carvedilol 6.25 Mg Tabs (Carvedilol) .[name].. 1 Tab By Mouth Two Times A Day 6)  Furosemide 80 Mg Tabs (Furosemide) .[name].. 1 Tab By Mouth Daily 7)  Aldactone 25 Mg Tabs (Spironolactone) .[name].. 1  Tab By Mouth Daily For Blood Pressure - Hold Due To Nightmares 8)  Simvastatin 80 Mg Tabs (Simvastatin) .... One Tablet By Mouth Daily For Cholesterol 9)  Lisinopril-Hydrochlorothiazide 20-25 Mg Tabs (Lisinopril-Hydrochlorothiazide) .[name].. 1 Tab By Mouth Daily 10)  Januvia 50 Mg Tabs (Sitagliptin Phosphate) .[name].. 1 Tab By Mouth Daily 11)  Triamcinolone Acetonide 0.1 % Oint (Triamcinolone Acetonide) .... Apply To Legs 2 To 3 Times Per Day 12)  Cialis 5 Mg Tabs (Tadalafil) .... Per Dr [facility] 13)  Viagra 25 Mg Tabs (Sildenafil Citrate) .... Per Dr [facility] 14)  Bactrim Ds 800-160 Mg Tabs (Sulfamethoxazole-Trimethoprim) .[name].. 1 By Mouth Two Times A Day X 10 Days  Allergies (verified): No Known Drug Allergies  Past History:  Past Medical History: Last updated: 12/02/2009 Obesity Smoldering Multiple Myeloma dx 2009 HTN PVD (Dr. Oneida Alar) DM2 CKD Stage 3 (Renal - [place]) DVT in 1980 2 to trauma (R  leg?) Chronic cystitis (Dr [facility]) ED (Dr Roni Bread) R Tibial artery occlusive disease -bypass on 6/06 Hx of nephrolithiasis Hx of  recurrent  umbilical hernia  (repaired) Hx of R hydrocele (repaired)  Past Surgical History: Last updated: 09/27/2007 -R hydrocelectomy on 05/15/07  by Dr. Irine Seal. -Cataract surgery ( L eye) - 09/11/2005  - deletion +  amputation 5th met ( R) - 05/25/2005 -doppler (ABI: R 0.71/ L: >1) 04/06  - Osteomyelites ,  I&D 5th met R foot x 2 (Osteo) - 12/12/2004,   - R Low extr arteriogram (results P) - 09/11/2005 - R popliteal to post tibial bypass - [id number]  -Umbilical hernia repaired   Social History: Smoking Status:  never  Review of Systems       Per HPI  Physical Exam  General:  NAD, obese, pleasant Vital signs noted Hypertensive Extremities:  right medial leg with keloid from bypass surgery Bilateral 3+ pitting edema, multiple thickened keloid lesions, hyperpigmentation of skin bilat, with a verrucacous  appearance 2x3cm ulceration LLE on posterior side , small ulcerations with yellow pus and blood on LLE RLE multiple small bleeding scabs within keloid lesions Chronic venous stasis changes    Impression & Recommendations:  Problem # 1:  ULCER, LEG (ICD-707.10) Assessment New Bilateral leg ulcerations likely secondary to DM and PVD. Patient also with large amount of pitting edema and per report unable to afford compression stockings. Will place [employer] today, start Bactrim DS. Wound culture taken, will change based on sensitivites. REFER to wound center. Given a prescription and place for cheaper stockings. RTC 1 week for nurse visit to change [employer] Orders: Culture, Wound -[place] [id number]) [place]- Est  Level 4 [id number]) [place] Referral (Wound Care)  Problem # 2:  HYPERTENSION, BENIGN SYSTEMIC (ICD-401.1) Assessment: Deteriorated  Pt has been off meds for 10 days per report will refill meds today, to follow-up in 1 week, recheck BP at that time, currently assymptomatic The following medications were removed from the medication list:    Enalapril Maleate 20 Mg Tabs (Enalapril maleate) .[name]... 1 daily His updated medication list for this problem includes:    Norvasc 10 Mg Tabs (Amlodipine besylate) .[name]... Take 1 tablet by mouth once a day.    Carvedilol 6.25 Mg Tabs (Carvedilol) .[name]... 1 tab by mouth two times a day    Furosemide 80 Mg Tabs (Furosemide) .[name]... 1 tab by mouth daily    Aldactone 25 Mg Tabs (Spironolactone) .[name]... 1 tab by mouth daily for blood pressure - hold due to nightmares    Lisinopril-hydrochlorothiazide 20-25 Mg Tabs (Lisinopril-hydrochlorothiazide) .[name]... 1 tab by mouth daily  Orders: [place]- Est  Level 4 [id number])  Problem # 3:  DIABETES MELLITUS, II, COMPLICATIONS (A999333) Assessment: Unchanged  Did not do A1C today, last per below. Pt out of meds x 10 days, will restart today. Elevated BS are not helping wound healing. The following medications were  removed from the medication list:    Enalapril Maleate 20 Mg Tabs (Enalapril maleate) .[name]... 1 daily His updated medication list for this problem includes:    Bayer Childrens Aspirin 81 Mg Chew (Aspirin) .[name]... Take 1 tablet by mouth once a day    Glucotrol 10 Mg Tabs (Glipizide) .[name]... Take 1 tablet by mouth before breakfast and before dinner    Actos 45 Mg Tabs (Pioglitazone hcl) .[name]... 1 tab by mouth daily    Lisinopril-hydrochlorothiazide 20-25 Mg Tabs (Lisinopril-hydrochlorothiazide) .[name]... 1 tab by mouth daily    Januvia 50 Mg Tabs (Sitagliptin phosphate) .[name]... 1 tab by mouth daily  Labs Reviewed: Creat: 1.98 (08/19/2009)   Microalbumin: 2+ (05/21/2008) Reviewed HgBA1c results: 8.5 (08/14/2009)  8.8 (05/06/2009)  Orders: [place]- Est  Level 4 [id number])  Problem # 4:  LYMPHEDEMA, SEVERE (ICD-457.1) Assessment: Deteriorated  Unna boot per #1   Orders: [place]- Est  Level 4 (  [id number])  Complete Medication List: 1)  Bayer Childrens Aspirin 81 Mg Chew (Aspirin) .... Take 1 tablet by mouth once a day 2)  Glucotrol 10 Mg Tabs (Glipizide) .... Take 1 tablet by mouth before breakfast and before dinner 3)  Norvasc 10 Mg Tabs (Amlodipine besylate) .... Take 1 tablet by mouth once a day. 4)  Actos 45 Mg Tabs (Pioglitazone hcl) .[name].. 1 tab by mouth daily 5)  Carvedilol 6.25 Mg Tabs (Carvedilol) .[name].. 1 tab by mouth two times a day 6)  Furosemide 80 Mg Tabs (Furosemide) .[name].. 1 tab by mouth daily 7)  Aldactone 25 Mg Tabs (Spironolactone) .[name].. 1 tab by mouth daily for blood pressure - hold due to nightmares 8)  Simvastatin 80 Mg Tabs (Simvastatin) .... One tablet by mouth daily for cholesterol 9)  Lisinopril-hydrochlorothiazide 20-25 Mg Tabs (Lisinopril-hydrochlorothiazide) .[name].. 1 tab by mouth daily 10)  Januvia 50 Mg Tabs (Sitagliptin phosphate) .[name].. 1 tab by mouth daily 11)  Triamcinolone Acetonide 0.1 % Oint (Triamcinolone acetonide) .... Apply to legs 2 to 3 times per day 12)  Cialis 5 Mg Tabs (Tadalafil)  .... [facility] 13)  Viagra 25 Mg Tabs (Sildenafil citrate) .... [facility] 14)  Bactrim Ds 800-160 Mg Tabs (Sulfamethoxazole-trimethoprim) .[name].. 1 by mouth two times a day x 10 days  Patient Instructions: 1)  Return in 1 week for your [employer] removal 2)  Please keep appt with Wound Care when we call you 3)  Take the antibiotic twice a day, we will call you if it needs to be changed 4)  Take the prescription for your stockings and call the number given 5)  I have refilled all of your other medications Prescriptions: TRIAMCINOLONE ACETONIDE 0.1 % OINT (TRIAMCINOLONE ACETONIDE) apply to legs 2 to 3 times per day  #1 x 1   Entered and Authorized by:   Vic Blackbird MD   Signed by:   Vic Blackbird MD on 12/23/2009   Method used:   Electronically to        [facility] [phone]* (retail)       [place], [place]  [address]       Ph: [phone]       Fax: [phone]   [id number] JANUVIA 50 MG TABS (SITAGLIPTIN PHOSPHATE) 1 tab by mouth daily  #34 x 5   Entered and Authorized by:   Vic Blackbird MD   Signed by:   Vic Blackbird MD on 12/23/2009   Method used:   Electronically to        [facility] [phone]* (retail)       [place], [place]  [address]       Ph: [phone]       Fax: [phone]   [id number] LISINOPRIL-HYDROCHLOROTHIAZIDE 20-25 MG TABS (LISINOPRIL-HYDROCHLOROTHIAZIDE) 1 tab by mouth daily  #34 x 5   Entered and Authorized by:   Vic Blackbird MD   Signed by:   Vic Blackbird MD on 12/23/2009   Method used:   Electronically to        [facility] [phone]* (retail)       [address]       [place], [place]  [address]       Ph: [phone]       Fax: [phone]  [id number] SIMVASTATIN 80 MG TABS (SIMVASTATIN) one tablet by mouth daily for cholesterol  #34 x 5   Entered and Authorized by:   Vic Blackbird MD   Signed by:    Vic Blackbird MD on 12/23/2009   Method used:   Electronically to        [facility] [phone]* (retail)       [place], [place]  [address]       Ph: [phone]       Fax: [phone]   [id number] CARVEDILOL 6.25 MG TABS (CARVEDILOL) 1 tab by mouth two times a day  #64 x 2   Entered and Authorized by:   Vic Blackbird MD   Signed by:   Vic Blackbird MD on 12/23/2009   Method used:   Electronically to        [facility] [phone]* (retail)       [place], [place]  [address]       Ph: [phone]       Fax: [phone]   [id number] ACTOS 45 MG TABS (PIOGLITAZONE HCL) 1 tab by mouth daily  #30 x 5   Entered and Authorized by:   Vic Blackbird MD   Signed by:   Vic Blackbird MD on 12/23/2009   Method used:   Electronically to        [facility] [phone]* (retail)       [place], [place]  [address]       Ph: [phone]       Fax: [phone]   [id number] NORVASC 10 MG TABS (AMLODIPINE BESYLATE) Take 1 tablet by mouth once a day.  #30 Tablet x 5   Entered and Authorized by:   Vic Blackbird MD   Signed by:   Vic Blackbird MD on 12/23/2009   Method used:   Electronically to        [facility] [phone]* (retail)       [place], [place]  [address]       Ph: [phone]       Fax: [phone]   RxID:   [id number] GLUCOTROL 10 MG TABS (GLIPIZIDE) Take 1 tablet by mouth before breakfast and before dinner  #68 Tablet x 3   Entered and Authorized by:   Vic Blackbird MD   Signed by:   Vic Blackbird MD on 12/23/2009   Method used:   Electronically to        [facility] [phone]* (retail)       [place], [place]  [address]       Ph: [phone]       Fax: [phone]   [id number] BACTRIM DS 800-160 MG TABS (SULFAMETHOXAZOLE-TRIMETHOPRIM) 1 by mouth two times a day x 10 days  #20 x 0   Entered and  Authorized by:   Vic Blackbird MD   Signed by:   Vic Blackbird MD on 12/23/2009   Method used:   Electronically to        [facility] [phone]* (retail)       [place], [place]  [id number]       Ph: [phone]       Fax: [phone]   [id number]

## 2011-01-11 NOTE — Assessment & Plan Note (Signed)
Summary: remove unna boot/eo  Nurse Visit [name] Boots removed from each leg. legs and lesions cleaned with saline . [name] viewed legs and advised to reapply [facility] bilaterally.  patient has  appointment with [facility] on 01/04/2010. [name] RN  December 30, 2009 5:03 PM   Allergies: No Known Drug Allergies  Orders Added: 1)  Christ[facility] Allegra application- [facility] [id number] 2)  Est Level 1- [facility] [id number]

## 2011-01-11 NOTE — Miscellaneous (Signed)
  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 (03/26/2010 8:44) Added new observation of PRIMARY MD: Andjela Wickes MD (03/26/2010 8:44)       Past History:  Past Medical History: Obesity HTN DM2 CKD Stage 3 (Renal - [place]) Leg Ulcers (Wound Center) DVT in 1980 2 to trauma (R  leg?) PVD (Dr Oneida Alar) Chronic cystitis (Dr [facility]) ED (Dr Roni Bread) 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

## 2011-01-11 NOTE — Progress Notes (Signed)
Summary: Rx Prob  Phone Note Call from Patient Call back at Home Phone [phone]   Caller: Patient Summary of Call: Pt states that pharmacy does not have the 750 mg strength of the Cipro.  What should he do?  Initial call taken by: Raymond Gurney,  December 28, 2009 4:24 PM  Follow-up for Phone Call         called pharmacy and they have ordered the 750 mg Cipro and will have in tomorrow afternoon. they do currently have 500 mg Cipro. do not have 250 mg . will send message to MD to please advise. Follow-up by: [name] RN,  December 28, 2009 4:30 PM  Additional Follow-up for Phone Call Additional follow up Details #1::        He can start tomorrow Additional Follow-up by: Vic Blackbird MD,  December 28, 2009 4:47 PM    Additional Follow-up for Phone Call Additional follow up Details #2::    patient notified . Follow-up by: [name] RN,  December 28, 2009 5:15 PM

## 2011-01-11 NOTE — Consult Note (Signed)
Summary: Wound Care  Wound Care   Imported By: Audie Clear 10/07/2010 12:16:53  _____________________________________________________________________  External Attachment:    Type:   Image     Comment:   External Document

## 2011-01-11 NOTE — Assessment & Plan Note (Signed)
Summary: f/u eo   Vital Signs:  Patient profile:   60 year old male Weight:      334.7 pounds BMI:     48.20 Pulse rate:   72 / minute BP sitting:   140 / 7  (right arm) Cuff size:   large  Vitals Entered By: Gerrit Heck slade,cma  Nutrition Counseling: Patient's BMI is greater than 25 and therefore counseled on weight management options. CC: discuss compression stockings. c/o chills and head ache x 3 days. Is Patient Diabetic? Yes Pain Assessment Patient in pain? yes     Location: head Intensity: 2 Onset of pain  tuesday   Primary Care Provider:  Charonda Hefter MD  CC:  discuss compression stockings. c/o chills and head ache x 3 days.[name]  History of Present Illness: 60 y/o M with  Fever: low grade fever at home x 2-3 days.  no chills.  no cough.  no wheezing.  no abdominal pain.  no lesions/infection/ulcers on legs.  ?rhinorrhea.  no throat pain.  no vomiting. no chest pain.  no headache.  Has not taken any antipyretics  DIABETES Meds: only started Januvia yesterday.   Compliance: no.  he does not seem to think that taking medications are important to controlling diabetes.  he also missed appt with Nehrologist, Dr Florene Glen Diet: no Exercise: no Lightheadedness: no   Dizziness: no    Shakiness: no    Abd  pain: no  Nausea: no  Vomiting: no CBG 190s-220s, but that was just this morning.  Has not checked other times  OBESITY BMI: 48.2 Weight difference since last OV: -0.3 lbs Exercise: none        How often:       Diet: no   PVD:  wants compression stockings.  was told by [facility] that he can only get compression stockings if he has dx of venous thrombosis   Habits & Providers  Alcohol-Tobacco-Diet     Tobacco Status: quit > 6 months     Tobacco Counseling: to remain off tobacco products  Exercise-Depression-Behavior     Does Patient Exercise: no     Exercise Counseling: to improve exercise regimen  Current Medications (verified): 1)  Bayer Childrens Aspirin 81 Mg  Chew (Aspirin) .... Take 1 Tablet By Mouth Once A Day 2)  Glucotrol 10 Mg Tabs (Glipizide) .... Take 1 Tablet By Mouth Before Breakfast and Before Dinner 3)  Norvasc 10 Mg Tabs (Amlodipine Besylate) .... Take 1 Tablet By Mouth Once A Day. 4)  Actos 45 Mg Tabs (Pioglitazone Hcl) .[name].. 1 Tab By Mouth Daily 5)  Carvedilol 12.5 Mg Tabs (Carvedilol) .[name].. 1 Tab By Mouth Two Times A Day Per Dr Florene Glen 6)  Furosemide 80 Mg Tabs (Furosemide) .[name].. 1 Tab By Mouth Daily 7)  Simvastatin 80 Mg Tabs (Simvastatin) .... One Tablet By Mouth Daily For Cholesterol 8)  Januvia 50 Mg Tabs (Sitagliptin Phosphate) .[name].. 1 Tab By Mouth Daily 9)  Triamcinolone Acetonide 0.1 % Oint (Triamcinolone Acetonide) .... Apply To Legs 2 To 3 Times Per Day.  Dispense 60grams. 10)  Cialis 5 Mg Tabs (Tadalafil) .... Per Dr [facility] 11)  Viagra 25 Mg Tabs (Sildenafil Citrate) .... Per Dr [facility] 12)  Bactrim Ds 800-160 Mg Tabs (Sulfamethoxazole-Trimethoprim) .[name].. 1 By Mouth Two Times A Day X 10 Days 13)  Cipro 750 Mg Tabs (Ciprofloxacin Hcl) .[name].. 1 By Mouth Two Times A Day X 10 Days 14)  Diabetic Shoes .... Dispense  One Pair of Diabetic Shoes  Allergies (verified): No Known Drug Allergies  Past History:  Past Surgical History: Last updated: 09/27/2007 -R hydrocelectomy on 05/15/07  by Dr. Irine Seal. -Cataract surgery ( L eye) - 09/11/2005  - deletion + amputation 5th met ( R) - 05/25/2005 -doppler (ABI: R 0.71/ L: >1) 04/06  - Osteomyelites ,  I&D 5th met R foot x 2 (Osteo) - 12/12/2004,   - R Low extr arteriogram (results P) - 09/11/2005 - R popliteal to post tibial bypass - [id number]  -Umbilical hernia repaired   Family History: Last updated: 09/27/2007 Diabetes 1st degree Fhx of [place]-Adams  Social History: Last updated: 05/21/2008 Lives with brother and brother's girlfriend.  Works at [employer] - recently laid off this year. Work hours reduced from 80 to 12 hours a week. 13 years hx  of smoking 1 to 2 packs of cigarrettes a day ( quit in 03/11/79). ETOH occ. NO drugs. Son passed away in 2001/03/10. Mother passed away in 02-10-1993 to surgical  complications.  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  Would not take meds as directed.  Social History: Smoking Status:  quit > 6 months  Review of Systems       per hpi General:  Complains of chills, fever, and malaise; denies fatigue and sweats; Fever chils x 3 days.  . ENT:  Denies hoarseness, nasal congestion, postnasal drainage, sinus pressure, and sore throat. CV:  Denies chest pain or discomfort, difficulty breathing at night, fainting, fatigue, lightheadness, and shortness of breath with exertion. Resp:  Denies chest discomfort, chest pain with inspiration, cough, and coughing up blood. GI:  Denies abdominal pain, bloody stools, constipation, diarrhea, nausea, vomiting, and vomiting blood.  Physical Exam  General:  Well-developed,well-nourished,in no acute distress; alert,appropriate and cooperative throughout examination. vitals reviewed Neck:  supple and full ROM.   Lungs:  Normal respiratory effort, chest expands symmetrically. Lungs are clear to auscultation, no crackles or wheezes. Heart:  Normal rate and regular rhythm. S1 and S2 normal without gallop, murmur, click, rub or other  extra sounds. Abdomen:  Bowel sounds positive,abdomen soft and non-tender without masses, organomegaly or hernias noted. obese Extremities:  Bilateral LE with diabetic blisters.  No ulcers present.  No infections present.  skink intact.  no redness. no erythema Neurologic:  alert & oriented X3.   Cervical Nodes:  No lymphadenopathy noted Axillary Nodes:  No palpable lymphadenopathy Psych:  Oriented X3.     Impression & Recommendations:  Problem # 1:  FEVER UNSPECIFIED (ICD-780.60) Assessment New Low grade fever.  NO chills.  No cough.  Lungs cta.  Legs without ulcers.  May be viral in nature.  Advised pt to come to [facility] if he is getting sick symptoms.    Problem # 2:  DIABETES MELLITUS, II, COMPLICATIONS (A999333) Assessment: Unchanged Pt just started taking Januvia yesterday.  I hope  that his A1C will improve with this regimen.  Will check A1C in 3 months.   His updated medication list for this problem includes:    Bayer Childrens Aspirin 81 Mg Chew (Aspirin) .[name]... Take 1 tablet by mouth once a day    Glucotrol 10 Mg Tabs (Glipizide) .[name]... Take 1 tablet by mouth before breakfast and before dinner    Actos 45 Mg Tabs (Pioglitazone hcl) .[name]... 1 tab by mouth daily    Januvia 50 Mg Tabs (Sitagliptin phosphate) .[name]... 1 tab by mouth daily  Orders: [place]- Est  Level 4 (99214)  Problem # 3:  OBESITY, MORBID (ICD-278.01) Assessment: Unchanged Pt does not seem concerned about this.  No lifestyle changes have been made.  Tried to encourage pt to exercise and diet for longterm health benefit.   Orders: [place]- Est  Level 4 [id number])  Problem # 4:  PERIPHERAL VASCULAR DISEASE, UNSPEC. (ICD-440.21) Assessment: Unchanged Placed call to [facility].  Informed them that pt does have Dx of PVD and DVT history.  They were not sure about the Dx necessary for stockings that are covered by medicaid.  After [facility] placed call to [facility], they informed me that pt would need to go to  facilities that are Board Certified in [place] and Prosthetics to get his compression stockings.  This is a Therapist, occupational.  Orders: [place]- Est  Level 4 ([address]214)  Problem # 5:  CHRONIC KIDNEY DISEASE STAGE III (MODERATE) (ICD-585.3) Assessment: Comment Only Missed appt with Dr Florene Glen  Complete Medication List: 1)  Bayer Childrens Aspirin 81 Mg Chew (Aspirin) .... Take 1 tablet by mouth once a day 2)  Glucotrol 10 Mg Tabs (Glipizide) .... Take 1 tablet by mouth before breakfast and before dinner 3)  Norvasc 10 Mg Tabs (Amlodipine besylate) .... Take 1 tablet by mouth once a day. 4)  Actos 45 Mg Tabs (Pioglitazone hcl) .[name].. 1 tab by mouth daily 5)  Carvedilol 12.5 Mg Tabs (Carvedilol) .[name].. 1 tab by mouth two times a day per dr Florene Glen 6)  Furosemide 80 Mg Tabs (Furosemide) .[name].. 1 tab by mouth daily 7)  Simvastatin 80 Mg Tabs (Simvastatin) .... One tablet by mouth daily for cholesterol 8)  Januvia 50 Mg Tabs (Sitagliptin phosphate) .[name].. 1 tab by mouth daily 9)  Triamcinolone Acetonide 0.1 % Oint (Triamcinolone acetonide) .... Apply to legs 2 to 3 times per day.  dispense 60grams. 10)  Cialis 5 Mg Tabs (Tadalafil) .... [facility] 11)  Viagra 25 Mg Tabs (Sildenafil citrate) .... [facility] 12)  Bactrim Ds 800-160 Mg Tabs (Sulfamethoxazole-trimethoprim) .[name].. 1 by mouth two times a day x 10 days 13)  Cipro 750 Mg Tabs (Ciprofloxacin hcl) .[name].. 1 by mouth two times a day x 10 days 14)  Diabetic Shoes  .... Dispense one pair of diabetic shoes  Patient Instructions: 1)  Please schedule a follow-up appointment in 3 months for Diabetes . 2)  Take all your medications as directed. 3)  We will start keeping of your diabetes number today since you just started taking Januvia.  I would like to check your A1C in 3 months.

## 2011-01-11 NOTE — Progress Notes (Signed)
Summary: triage  Phone Note Call from Patient Call back at Home Phone [phone]   Caller: Patient Summary of Call: needs antibiotic for his leg- caregiver states it has a smell to it and it is breaking out some. Initial call taken by: Audie Clear,  December 23, 2009 11:09 AM  Follow-up for Phone Call        advised patient he will need to be seen . appointment scheduled today. Follow-up by: [name] RN,  December 23, 2009 11:31 AM

## 2011-01-11 NOTE — Assessment & Plan Note (Signed)
Summary: f/u,df   Vital Signs:  Patient profile:   60 year old male Height:      70 inches Weight:      335 pounds BMI:     48.24 Temp:     98.0 degrees F oral Pulse rate:   65 / minute BP sitting:   155 / 69  (right arm) Cuff size:   large  Vitals Entered By: Schuyler Amor CMA (March 24, 2010 2:51 PM) CC: leg ulcer Is Patient Diabetic? Yes Pain Assessment Patient in pain? no        Primary Care Provider:  Alessandra Sawdey MD  CC:  leg ulcer.  History of Present Illness: 60 y/o M with Leg ulcer: 2 wks.  + yellow drainage, + swelling, -pain, + weight bearing, -fever, -chills.  Pt is followed by Wound Care at [facility]. He states that he was discharged from their clinic.  He was told that he can come back as needed, but he has tried to call them but was unable to reach them.   School: [employer] full time.  Basic classes for AA in Telecomunications.  Just started in January.  Work: part time job in Therapist, art.   Habits & Providers  Alcohol-Tobacco-Diet     Tobacco Status: quit  Current Medications (verified): 1)  Bayer Childrens Aspirin 81 Mg Chew (Aspirin) .... Take 1 Tablet By Mouth Once A Day 2)  Glucotrol 10 Mg Tabs (Glipizide) .... Take 1 Tablet By Mouth Before Breakfast and Before Dinner 3)  Norvasc 10 Mg Tabs (Amlodipine Besylate) .... Take 1 Tablet By Mouth Once A Day. 4)  Actos 45 Mg Tabs (Pioglitazone Hcl) .[name].. 1 Tab By Mouth Daily 5)  Carvedilol 6.25 Mg Tabs (Carvedilol) .[name].. 1 Tab By Mouth Two Times A Day 6)  Furosemide 80 Mg Tabs (Furosemide) .[name].. 1 Tab By Mouth Daily 7)  Simvastatin 80 Mg Tabs (Simvastatin) .... One Tablet By Mouth Daily For Cholesterol 8)  Januvia 50 Mg Tabs (Sitagliptin Phosphate) .[name].. 1 Tab By Mouth Daily 9)  Triamcinolone Acetonide 0.1 % Oint (Triamcinolone Acetonide) .... Apply To Legs 2 To 3 Times Per Day.  Dispense 60grams. 10)  Cialis 5 Mg Tabs (Tadalafil) .... Per Dr [facility] 11)  Viagra 25 Mg Tabs (Sildenafil  Citrate) .... Per Dr [facility] 12)  Bactrim Ds 800-160 Mg Tabs (Sulfamethoxazole-Trimethoprim) .[name].. 1 By Mouth Two Times A Day X 10 Days 13)  Cipro 750 Mg Tabs (Ciprofloxacin Hcl) .[name].. 1 By Mouth Two Times A Day X 10 Days 14)  Diabetic Shoes .... Dispense One Pair of Diabetic Shoes  Allergies (verified): No Known Drug Allergies  Social History: Smoking Status:  quit  Physical Exam  General:  Well-developed,well-nourished,in no acute distress; alert,appropriate and cooperative throughout examination. vitals reviewed   Impression & Recommendations:  Problem # 1:  ULCER, LEG (ICD-707.10) Assessment New Wound Cx obtained.  Given his chronic would conditions and diabetes will start broad spectrum atbx (Bactrim and Cipro) and treat as cellulitis.   Awaiting sensitivities.  Appt has been made with Wound Care for next Monday.   Orders: Culture, Wound -[id number]) Gram Stain-[facility] ([id number]) [place]- Est Level  3 [id number])  Problem # 2:  DIABETES MELLITUS, II, COMPLICATIONS (A999333) Assessment: Unchanged A1C similar to previous, but still not at goal of 8.  Pt very resistant to taking insulin.  He has been out of Januvia x 2 weeks, but did not call for refills.  Will resume  previous meds.   The following medications were removed from the medication list:    Lisinopril-hydrochlorothiazide 20-25 Mg Tabs (Lisinopril-hydrochlorothiazide) .[name]... 1 tab by mouth daily His updated medication list for this problem includes:    Bayer Childrens Aspirin 81 Mg Chew (Aspirin) .[name]... Take 1 tablet by mouth once a day    Glucotrol 10 Mg Tabs (Glipizide) .[name]... Take 1 tablet by mouth before breakfast and before dinner    Actos 45 Mg Tabs (Pioglitazone hcl) .[name]... 1 tab by mouth daily    Januvia 50 Mg Tabs (Sitagliptin phosphate) .[name]... 1 tab by mouth daily  Orders: A1C-[facility] ([id number])Future Orders: Lipid-[facility] [id number]) ... 03/19/2011 Comp Met-[facility] [id number]) ... 04/09/2011  Problem # 3:   CHRONIC KIDNEY DISEASE STAGE III (MODERATE) (ICD-585.3) Assessment: Comment Only Was seen by [facility] (Dr Florene Glen) 01/2010.  Will call [facility] for OV notes.  Pt states that Dr Florene Glen d/c lisinopril-hctz combo 2/2 decreases renal function.   Complete Medication List: 1)  Bayer Childrens Aspirin 81 Mg Chew (Aspirin) .... Take 1 tablet by mouth once a day 2)  Glucotrol 10 Mg Tabs (Glipizide) .... Take 1 tablet by mouth before breakfast and before dinner 3)  Norvasc 10 Mg Tabs (Amlodipine besylate) .... Take 1 tablet by mouth once a day. 4)  Actos 45 Mg Tabs (Pioglitazone hcl) .[name].. 1 tab by mouth daily 5)  Carvedilol 6.25 Mg Tabs (Carvedilol) .[name].. 1 tab by mouth two times a day 6)  Furosemide 80 Mg Tabs (Furosemide) .[name].. 1 tab by mouth daily 7)  Simvastatin 80 Mg Tabs (Simvastatin) .... One tablet by mouth daily for cholesterol 8)  Januvia 50 Mg Tabs (Sitagliptin phosphate) .[name].. 1 tab by mouth daily 9)  Triamcinolone Acetonide 0.1 % Oint (Triamcinolone acetonide) .... Apply to legs 2 to 3 times per day.  dispense 60grams. 10)  Cialis 5 Mg Tabs (Tadalafil) .... [facility] 11)  Viagra 25 Mg Tabs (Sildenafil citrate) .... [facility] 12)  Bactrim Ds 800-160 Mg Tabs (Sulfamethoxazole-trimethoprim) .[name].. 1 by mouth two times a day x 10 days 13)  Cipro 750 Mg Tabs (Ciprofloxacin hcl) .[name].. 1 by mouth two times a day x 10 days 14)  Diabetic Shoes  .... Dispense one pair of diabetic shoes  Patient Instructions: 1)  Please schedule a follow-up appointment in 2-4 weeks for legs.  2)  Please make appt for cholesterol check.  This has to be fasting, so no food or drink after midnight the night before appointment. 3)  Please schedule an appointment with Dr Jenne Campus for nutrition and diet. 4)  New meds (2): Bactrim two times a day x 10 days, Cipro two times a day x 10 days. 5)  It is important that you exercise reguarly at least 20 minutes 5 times a week. If you develop  chest pain, have severe difficulty breathing, or feel very tired, stop exercising immediately and seek medical attention.  6)  You need to lose weight. Consider a lower calorie diet and regular exercise.  Prescriptions: TRIAMCINOLONE ACETONIDE 0.1 % OINT (TRIAMCINOLONE ACETONIDE) apply to legs 2 to 3 times per day.  Dispense 60grams.  #1 x 3   Entered and Authorized by:   Merla Riches MD   Signed by:   Merla Riches MD on 03/24/2010   Method used:   Electronically to        [facility] [phone]* (retail)       [place]  [place], [place]  [address]       Ph: [phone]       Fax: [phone]   [id number] BACTRIM DS 800-160 MG TABS (SULFAMETHOXAZOLE-TRIMETHOPRIM) 1 by mouth two times a day x 10 days  #20 x 0   Entered and Authorized by:   Merla Riches MD   Signed by:   Merla Riches MD on 03/24/2010   Method used:   Electronically to        [facility] [phone]* (retail)       [place], [place]  [address]       Ph: [phone]       Fax: [phone]   RxID:   [id number] CIPRO 750 MG TABS (CIPROFLOXACIN HCL) 1 by mouth two times a day x 10 days  #20 x 0   Entered and Authorized by:   Merla Riches MD   Signed by:   Merla Riches MD on 03/24/2010   Method used:   Electronically to        [facility] [phone]* (retail)       [place], [place]  [address]       Ph: [phone]       Fax: [phone]   RxID:   [id number] JANUVIA 50 MG TABS (SITAGLIPTIN PHOSPHATE) 1 tab by mouth daily  #34 x 6   Entered and Authorized by:   Merla Riches MD   Signed by:   Merla Riches MD on 03/24/2010   Method used:   Electronically to        [facility] [phone]* (retail)       [place], [place]  [address]       Ph: [phone]       Fax: [phone]   [id number] SIMVASTATIN 80 MG TABS (SIMVASTATIN) one tablet by mouth daily for cholesterol  #34 x 6   Entered and Authorized by:   Merla Riches MD   Signed by:   Merla Riches MD on  03/24/2010   Method used:   Electronically to        [facility] [phone]* (retail)       [place], [place]  [address]       Ph: [phone]       Fax: [phone]   [id number] FUROSEMIDE 80 MG TABS (FUROSEMIDE) 1 tab by mouth daily  #34 x 6   Entered and Authorized by:   Merla Riches MD   Signed by:   Merla Riches MD on 03/24/2010   Method used:   Electronically to        [facility] [phone]* (retail)       [place], [place]  [address]       Ph: [phone]       Fax: [phone]   [id number] CARVEDILOL 6.25 MG TABS (CARVEDILOL) 1 tab by mouth two times a day  #64 x 6   Entered and Authorized by:   Merla Riches MD   Signed by:   Merla Riches MD on 03/24/2010   Method used:   Electronically to        [facility] [phone]* (retail)  [place], [place]  [address]       Ph: [phone]       Fax: [phone]   [id number] ACTOS 45 MG TABS (PIOGLITAZONE HCL) 1 tab by mouth daily  #30 x 6   Entered and Authorized by:   Merla Riches MD   Signed by:   Merla Riches MD on 03/24/2010   Method used:   Electronically to        [facility] [phone]* (retail)       [place], [place]  [address]       Ph: [phone]       Fax: [phone]   RxID:   [id number] NORVASC 10 MG TABS (AMLODIPINE BESYLATE) Take 1 tablet by mouth once a day.  #30 x 6   Entered and Authorized by:   Merla Riches MD   Signed by:   Merla Riches MD on 03/24/2010   Method used:   Electronically to        [facility] [phone]* (retail)       [place], [place]  [address]       Ph: [phone]       Fax: [phone]   RxID:   [id number] GLUCOTROL 10 MG TABS (GLIPIZIDE) Take 1 tablet by mouth before breakfast and before dinner  #68 x 6   Entered and Authorized by:   Merla Riches MD   Signed by:   Merla Riches MD on 03/24/2010   Method used:   Electronically to        [facility] [phone]*  (retail)       [address]       [place], [place]  [address]       Ph: [phone]       Fax: [phone]   RxID:   [id number]   Laboratory Results   Blood Tests   Date/Time Received: March 24, 2010 2:31 PM  Date/Time Reported: March 24, 2010 3:05 PM   HGBA1C: 8.5%   (Normal Range: Non-Diabetic - 3-6%   Control Diabetic - 6-8%)  Comments: ...........test performed by...........Marland KitchenHedy Camara, CMA      Prevention & Chronic Care Immunizations   Influenza vaccine: refused  (10/10/2008)   Influenza vaccine due: Refused  (05/06/2009)    Tetanus booster: 05/12/2005: Done.   Tetanus booster due: 05/13/2015    Pneumococcal vaccine: Not documented    H. zoster vaccine: Not documented  Colorectal Screening   Hemoccult: Not Indicated  (10/10/2008)   Hemoccult due: Not Indicated    Colonoscopy: Results: Polyp in rectum and in transverse colon.  Small lipoma distal ascending colon Results: Specimens sent for pathology. Both polyps adenomatous, 2 others found were hyperplastic.  None with high grade dysplasia. Results: Internal and external Hemorrhoids.     Location:  [facility].    (07/01/2008)   Colonoscopy action/deferral: Repeat colonoscopy in 3 years.     (07/01/2008)   Colonoscopy due: 07/02/2011  Other Screening   PSA: 0.29  (05/21/2008)   PSA action/deferral: Discussed-PSA requested  (03/24/2010)   PSA due due: 05/21/2009   Smoking status: quit  (03/24/2010)  Diabetes Mellitus   HgbA1C: 8.5  (03/24/2010)   Hemoglobin A1C due: 08/21/2008    Eye exam: Not documented    Foot exam: yes  (05/06/2009)   High risk foot: Not  documented   Foot care education: Not documented   Foot exam due: 04/12/2008    Urine microalbumin/creatinine ratio: Not documented   Urine microalbumin/cr due: 05/21/2009    Diabetes flowsheet reviewed?: Yes   Progress toward A1C goal: Unchanged  Lipids   Total Cholesterol: 180  (08/19/2009)   LDL: 112  (08/19/2009)   LDL  Direct: Not documented   HDL: 28  (08/19/2009)   Triglycerides: 202  (08/19/2009)    SGOT (AST): 14  (08/19/2009)   SGPT (ALT): 13  (08/19/2009) CMP ordered    Alkaline phosphatase: 73  (08/19/2009)   Total bilirubin: 0.3  (08/19/2009)    Lipid flowsheet reviewed?: Yes   Progress toward LDL goal: Unchanged  Hypertension   Last Blood Pressure: 155 / 69  (03/24/2010)   Serum creatinine: 1.98  (08/19/2009)   Serum potassium 4.5  (08/19/2009) CMP ordered     Hypertension flowsheet reviewed?: Yes   Progress toward BP goal: Improved  Self-Management Support :   Personal Goals (by the next clinic visit) :     Personal A1C goal: 7  (09/24/2009)     Personal blood pressure goal: 130/80  (09/24/2009)     Personal LDL goal: 100  (09/24/2009)    Diabetes self-management support: Written self-care plan  (03/24/2010)   Diabetes care plan printed    Hypertension self-management support: Written self-care plan  (03/24/2010)   Hypertension self-care plan printed.    Lipid self-management support: Written self-care plan  (03/24/2010)   Lipid self-care plan printed.

## 2011-01-11 NOTE — Assessment & Plan Note (Signed)
Summary: finger swollen & painful x 2 wk/Gray/Ta   Vital Signs:  Patient profile:   60 year old male Height:      71 inches Weight:      325 pounds BMI:     45.49 Temp:     98.2 degrees F Pulse rate:   73 / minute BP sitting:   181 / 79  Vitals Entered By: Elige Radon RN (July 08, 2010 2:17 PM)  Primary Provider:  Merla Riches MD   History of Present Illness: CC: swollen, painful R middle finger  HPI: 60 yo AAM w/ hx of DM Type 2 presents with swollen and painful R middle finger that began 2.5 weeks ago.  Has been taking a journal class and noticed a knot forming 2 weeks ago.  His finger has progressively become warm, red, and swollen.  The pain started at the PIP and has moved to the DIP to the point where he can no longer make a fist or bend his DIP joint.  Ice and heat has not improved his symptoms.  The pain is a constant 8/10 that radiates up and down his middle finger.  He denies any puncture wound or traumatic injury that could explain the swelling.    ROS: Denies any numbness or tingling of R hand.  He is afebrile and denies any chills or night sweats.  Last HA1c was 8.5.  No CP, SOB, N/V, HA.   Habits & Providers  Alcohol-Tobacco-Diet     Alcohol drinks/day: 0     Tobacco Status: quit     Diet Comments: no salt, low sugar     Diet Counseling: to improve diet; diet is suboptimal  Exercise-Depression-Behavior     Drug Use: never     Seat Belt Use: sometimes     Seat Belt Counseling: counselled to wear  Problems Prior to Update: 1)  Cellulitis, Finger  (ICD-681.00) 2)  Nephrolithiasis  (ICD-592.0) 3)  Fever Unspecified  (ICD-780.60) 4)  Diabetes Mellitus, II, Complications  ([id number]) 5)  Hypertension, Benign Systemic  (ICD-401.1) 6)  Hyperlipidemia  (ICD-272.4) 7)  Chronic Kidney Disease Stage Iii (MODERATE)  (ICD-585.3) 8)  Ulcer, Leg  (ICD-707.10) 9)  Peripheral Vascular Disease, Unspec.  (ICD-440.21) 10)  Venous Insufficiency, Legs  (ICD-459.81) 11)  Obesity,  Morbid  (ICD-278.01) 12)  Cystitis, Chronic  (ICD-595.2) 13)  Lymphedema, Severe  (ICD-457.1) 14)  Onychomycosis  (ICD-110.1) 15)  Carotid Bruit, Left  (ICD-785.9) 16)  Smoldering Multiple Myeloma  () 17)  Organic Impotence  (ICD-607.84) 18)  Nephrolithiasis, Hx of  (ICD-V13.01) 19)  Dystrophic Nails  (ICD-703.8) 20)  Hx of Osteomyelitis  (ICD-730.20) 21)  Hx of Umbilical Hernia  ([id number])  Current Problems (verified): 1)  Cellulitis, Finger  (ICD-681.00) 2)  Nephrolithiasis  (ICD-592.0) 3)  Fever Unspecified  (ICD-780.60) 4)  Diabetes Mellitus, II, Complications  (A999333) 5)  Hypertension, Benign Systemic  (ICD-401.1) 6)  Hyperlipidemia  (ICD-272.4) 7)  Chronic Kidney Disease Stage Iii (MODERATE)  (ICD-585.3) 8)  Ulcer, Leg  (ICD-707.10) 9)  Peripheral Vascular Disease, Unspec.  (ICD-440.21) 10)  Venous Insufficiency, Legs  (ICD-459.81) 11)  Obesity, Morbid  (ICD-278.01) 12)  Cystitis, Chronic  (ICD-595.2) 13)  Lymphedema, Severe  (ICD-457.1) 14)  Onychomycosis  (ICD-110.1) 15)  Carotid Bruit, Left  (ICD-785.9) 16)  Smoldering Multiple Myeloma  () 17)  Organic Impotence  (ICD-607.84) 18)  Nephrolithiasis, Hx of  (ICD-V13.01) 19)  Dystrophic Nails  (ICD-703.8) 20)  Hx of Osteomyelitis  (ICD-730.20) 21)  Hx of  Umbilical Hernia  ([id number])  Medications Prior to Update: 1)  Bayer Childrens Aspirin 81 Mg Chew (Aspirin) .... Take 1 Tablet By Mouth Once A Day 2)  Glucotrol 10 Mg Tabs (Glipizide) .... Take 1 Tablet By Mouth Before Breakfast and Before Dinner 3)  Norvasc 10 Mg Tabs (Amlodipine Besylate) .... Take 1 Tablet By Mouth Once A Day. 4)  Actos 45 Mg Tabs (Pioglitazone Hcl) .[name].. 1 Tab By Mouth Daily 5)  Carvedilol 12.5 Mg Tabs (Carvedilol) .[name].. 1 Tab By Mouth Two Times A Day Per Dr Florene Glen 6)  Furosemide 80 Mg Tabs (Furosemide) .[name].. 1 Tab By Mouth Daily 7)  Simvastatin 80 Mg Tabs (Simvastatin) .... One Tablet By Mouth Daily For Cholesterol 8)  Januvia 50 Mg Tabs  (Sitagliptin Phosphate) .[name].. 1 Tab By Mouth Daily 9)  Triamcinolone Acetonide 0.1 % Oint (Triamcinolone Acetonide) .... Apply To Legs 2 To 3 Times Per Day.  Dispense 60grams. 10)  Cialis 5 Mg Tabs (Tadalafil) .... Per Dr Roni [facility] 11)  Viagra 25 Mg Tabs (Sildenafil Citrate) .... Per Dr Roni [facility] 12)  Bactrim Ds 800-160 Mg Tabs (Sulfamethoxazole-Trimethoprim) .[name].. 1 By Mouth Two Times A Day X 10 Days 13)  Cipro 750 Mg Tabs (Ciprofloxacin Hcl) .[name].. 1 By Mouth Two Times A Day X 10 Days 14)  Diabetic Shoes .... Dispense One Pair of Diabetic Shoes  Current Medications (verified): 1)  Bayer Childrens Aspirin 81 Mg Chew (Aspirin) .... Take 1 Tablet By Mouth Once A Day 2)  Glucotrol 10 Mg Tabs (Glipizide) .... Take 1 Tablet By Mouth Before Breakfast and Before Dinner 3)  Norvasc 10 Mg Tabs (Amlodipine Besylate) .... Take 1 Tablet By Mouth Once A Day. 4)  Actos 45 Mg Tabs (Pioglitazone Hcl) .[name].. 1 Tab By Mouth Daily 5)  Carvedilol 12.5 Mg Tabs (Carvedilol) .[name].. 1 Tab By Mouth Two Times A Day Per Dr Florene Glen 6)  Furosemide 80 Mg Tabs (Furosemide) .[name].. 1 Tab By Mouth Daily 7)  Simvastatin 80 Mg Tabs (Simvastatin) .... One Tablet By Mouth Daily For Cholesterol 8)  Januvia 50 Mg Tabs (Sitagliptin Phosphate) .[name].. 1 Tab By Mouth Daily 9)  Triamcinolone Acetonide 0.1 % Oint (Triamcinolone Acetonide) .... Apply To Legs 2 To 3 Times Per Day.  Dispense 60grams. 10)  Cialis 5 Mg Tabs (Tadalafil) .... Per Dr Roni [facility] 11)  Viagra 25 Mg Tabs (Sildenafil Citrate) .... Per Dr Roni [facility] 12)  Bactrim Ds 800-160 Mg Tabs (Sulfamethoxazole-Trimethoprim) .[name].. 1 By Mouth Two Times A Day X 10 Days 13)  Cipro 750 Mg Tabs (Ciprofloxacin Hcl) .[name].. 1 By Mouth Two Times A Day X 10 Days 14)  Diabetic Shoes .... Dispense One Pair of Diabetic Shoes 15)  Bactrim Ds 800-160 Mg Tabs (Sulfamethoxazole-Trimethoprim) .... Take Two Tablets Twice A Day.  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  Would not take  meds as directed.  Family History: Reviewed history from 09/27/2007 and no changes required. Diabetes 1st degree Fhx of Stokes-Adams  Social History: Reviewed history from 05/21/2008 and no changes required. Lives with brother and brother's girlfriend.  Works at [employer] - recently laid off this year. Work hours reduced from 80 to 12 hours a week. 13 years hx of smoking 1 to 2 packs of cigarrettes a day ( quit in 04/02/1979). ETOH occ. NO drugs. Son passed away in April 01, 2001. Mother passed away in 1993-03-04 to surgical  complications.Smoking Status:  quit Drug Use:  never Seat Belt Use:  sometimes  Review of Systems       per hpi  Physical Exam  Msk:  no joint swelling, decreased ROM, joint tenderness, joint swelling, joint warmth, enlarged MCP joints, enlarged PIP joints, and enlarged DIP joints.   Neurologic:  strength normal in upper extremities, sensation intact to light touch, and sensation intact to pinprick.     Impression & Recommendations:  Problem # 1:  CELLULITIS, FINGER (ICD-681.00) Assessment New Pt presents with new complaint of painful and swollen R middle finger.  Discussed with patient that the pain could be due to an infection of the joint or cellulitis.  Patient was afebrile.  However, the swelling was over the DIP  joint and pt could not bend his finger or make a fist with his R hand.  Precepted with Dr. Lindell Noe who also saw the pt and agreed that this could be cellulitis.  Had long discussion with pt about his options: 1) be admitted to the hospital to start IV antibiotics and consult hand surgeon, or 2) obtain an Xray today and start oral Bactrim, obtain CBC, BMP, and uric acid levels today, and follow up with me tomorrow afternoon.  We strongly advised patient to be admitted to hospital due to the fact that his condition could lead to joint erosion, osteomyelitis, or ultimately loss of his middle finger and ability to use his dominant hand.  Pt understood his options.  He declined hospital admission because he could not miss work this evening.  Pt agreed to return to clinic tomorrow to re-evaluate his finger.  His updated medication list for this problem includes:    Bactrim Ds 800-160 Mg Tabs (Sulfamethoxazole-trimethoprim) .[name]... 1 by mouth two times a day x 10 days    Cipro 750 Mg Tabs (Ciprofloxacin hcl) .[name]... 1 by mouth two times a day x 10 days    Bactrim Ds 800-160 Mg Tabs (Sulfamethoxazole-trimethoprim) .[name]... Take two tablets twice a day.  Orders: CBC w/Diff-[facility] [id number]) Sed Rate (ESR)-[facility] [phone]) Uric Acid-[facility] [id number]) Diagnostic X-Ray/Fluoroscopy (Diagnostic X-Ray/Flu) Basic Met-[facility] [id number]) [facility]- Est Level  3 [id number])  Complete Medication List: 1)  Bayer Childrens Aspirin 81 Mg Chew (Aspirin) .... Take 1 tablet by mouth once a day 2)  Glucotrol 10 Mg Tabs (Glipizide) .... Take 1 tablet by mouth before breakfast and before dinner 3)  Norvasc 10 Mg Tabs (Amlodipine besylate) .... Take 1 tablet by mouth once a day. 4)  Actos 45 Mg Tabs (Pioglitazone hcl) .[name].. 1 tab by mouth daily 5)  Carvedilol 12.5 Mg Tabs (Carvedilol) .[name].. 1 tab by mouth two times a day per dr Florene Glen 6)  Furosemide 80 Mg Tabs (Furosemide) .[name].. 1 tab by mouth daily 7)  Simvastatin 80 Mg Tabs (Simvastatin) .... One tablet  by mouth daily for cholesterol 8)  Januvia 50 Mg Tabs (Sitagliptin phosphate) .[name].. 1 tab by mouth daily 9)  Triamcinolone Acetonide 0.1 %  Oint (Triamcinolone acetonide) .... Apply to legs 2 to 3 times per day.  dispense 60grams. 10)  Cialis 5 Mg Tabs (Tadalafil) .... [facility] dr [facility] 11)  Viagra 25 Mg Tabs (Sildenafil citrate) .... [facility] dr [facility] 12)  Bactrim Ds 800-160 Mg Tabs (Sulfamethoxazole-trimethoprim) .[name].. 1 by mouth two times a day x 10 days 13)  Cipro 750 Mg Tabs (Ciprofloxacin hcl) .[name].. 1 by mouth two times a day x 10 days 14)  Diabetic Shoes  .... Dispense one pair of diabetic shoes 15)  Bactrim Ds 800-160 Mg Tabs (Sulfamethoxazole-trimethoprim) .... Take two tablets twice a day.  Patient Instructions: 1)  Please follow up with physician Friday, July 29th to re-evaluate finger. 2)  Please get Xray of Right hand this afternoon. 3)  Take Bactrim as directed. Prescriptions: BACTRIM DS 800-160 MG TABS (SULFAMETHOXAZOLE-TRIMETHOPRIM) Take two tablets twice a day.  #56 x 0   Entered and Authorized by:   Ivy de Ronda Fairly  MD   Signed by:   Donnamarie Rossetti  MD on 07/08/2010   Method used:   Print then Give to Patient   RxID:   [id number]   Patient Instructions: 1)  Please follow up with physician Friday, July 29th to re-evaluate finger. 2)  Please get Xray of Right hand this afternoon. 3)  Take Bactrim as directed.

## 2011-01-11 NOTE — Progress Notes (Signed)
Summary: results  Will send letter with results of Xray to patient.    Phone Note Call from Patient Call back at Home Phone [phone]   Caller: Patient Summary of Call: would like results of his xray  Initial call taken by: Audie Clear,  July 14, 2010 9:39 AM

## 2011-01-11 NOTE — Progress Notes (Signed)
Summary: PA for Januvia  Phone Note Outgoing Call   Call placed by: Merla Riches MD,  Apr 26, 2010 5:25 PM Call placed to: Specialist Summary of Call: Called PA for state of [place] Januvia.  Januvia was started in 09/2009.  Pt has Cr 1.8-2.19 so cannot take Metformin.    Pt has not been taking Januvia according to [place] office because there has not been any claims on the med going back since at least Jan 10, 2010.  .  This would make sense since there has been no improvement in his A1C.    Got approval for Januvia 50mg  daily, #30 tabs per month.  Expire 04/26/2011.  Called [facility] to inform them of this. Initial call taken by: Uchenna Seufert MD,  Apr 26, 2010 5:37 PM

## 2011-01-11 NOTE — Letter (Signed)
Summary: Generic Letter  [facility]  [address]   [place], [place] [address]   Phone: [phone]  Fax: [phone]    07/17/2010  ERMAN IKNER [address] [place], [place]  [address]  Dear Mr. Carlucci,  I hope this letter finds you well.  Below you will find the results of the Xray of your right middle finger.  Please call if you have any questions or concerns.  Clinical Data: Pain distally, the patient has diabetes   RIGHT MIDDLE FINGER 2+V   Comparison: None.   Findings: There is loss of joint space primarily involving the DIP joints but also the PIP joints with some sclerosis and spurring, consistent with advanced osteoarthritis.  No demineralization or destruction is seen.   IMPRESSION: Osteoarthritis involving the DIP and to a lesser degree PIP joints.    Sincerely,   Ivy de Ronda Fairly  MD  Appended Document: Generic Letter mailed

## 2011-01-11 NOTE — Progress Notes (Signed)
  Phone Note Outgoing Call   Call placed by: Vic Blackbird MD,  December 28, 2009 12:34 PM Details for Reason: Lab results Summary of Call: Klebsiella not senstive to Bactrim, will switch to Cipro. Nursing working on Wound care visit. Pt voiced understanding    New/Updated Medications: CIPRO 750 MG TABS (CIPROFLOXACIN HCL) 1 by mouth two times a day x 10 days Prescriptions: CIPRO 750 MG TABS (CIPROFLOXACIN HCL) 1 by mouth two times a day x 10 days  #20 x 0   Entered and Authorized by:   Vic Blackbird MD   Signed by:   Vic Blackbird MD on 12/28/2009   Method used:   Electronically to        [facility] [phone]* (retail)       [address]       [place], [place]  [address]       Ph: [phone]       Fax: [phone]   RxID:   [id number]

## 2011-01-11 NOTE — Letter (Signed)
Summary: Generic Letter  [facility]  [address]   [place], [place] [address]   Phone: [phone]  Fax: [phone]    05/21/2010  [name] [id number] [place],   [address]  Dear [name] made inquiries to [facility], and [facility] regarding compression stockings.  These facilities are not authorized by Medicaid to provide Compression Stockings.  If you would like them, you may have to pay for them out of pocket.      Sincerely,   Makisha Marrin MD  Appended Document: Generic Letter letter mailed.

## 2011-01-11 NOTE — Consult Note (Signed)
Summary: [facility]  [facility]   Imported By: Raymond Gurney 09/28/2010 15:46:25  _____________________________________________________________________  External Attachment:    Type:   Image     Comment:   External Document

## 2011-01-12 ENCOUNTER — Encounter (HOSPITAL_BASED_OUTPATIENT_CLINIC_OR_DEPARTMENT_OTHER): Payer: Self-pay | Admitting: General Surgery

## 2011-01-13 ENCOUNTER — Encounter (INDEPENDENT_AMBULATORY_CARE_PROVIDER_SITE_OTHER): Payer: Medicaid Other

## 2011-01-13 ENCOUNTER — Ambulatory Visit (INDEPENDENT_AMBULATORY_CARE_PROVIDER_SITE_OTHER): Payer: Medicaid Other | Admitting: Vascular Surgery

## 2011-01-13 ENCOUNTER — Ambulatory Visit: Admit: 2011-01-13 | Payer: Self-pay | Admitting: Vascular Surgery

## 2011-01-13 DIAGNOSIS — Z48812 Encounter for surgical aftercare following surgery on the circulatory system: Secondary | ICD-10-CM

## 2011-01-13 DIAGNOSIS — I872 Venous insufficiency (chronic) (peripheral): Secondary | ICD-10-CM

## 2011-01-13 DIAGNOSIS — I739 Peripheral vascular disease, unspecified: Secondary | ICD-10-CM

## 2011-01-13 NOTE — Consult Note (Signed)
[facility]: [facility]   Imported By: Audie Clear 11/18/2010 16:07:50  _____________________________________________________________________  External Attachment:    Type:   Image     Comment:   External Document

## 2011-01-14 ENCOUNTER — Encounter (HOSPITAL_BASED_OUTPATIENT_CLINIC_OR_DEPARTMENT_OTHER): Payer: Medicare Other | Attending: General Surgery

## 2011-01-14 DIAGNOSIS — I890 Lymphedema, not elsewhere classified: Secondary | ICD-10-CM | POA: Insufficient documentation

## 2011-01-21 NOTE — Procedures (Unsigned)
BYPASS GRAFT EVALUATION  INDICATION:  Follow up bypass graft.  HISTORY: Diabetes:  Yes Cardiac:  Cardiovascular disease. Hypertension:  Yes Smoking:  No Previous Surgery:  Right popliteal to posterior tibial artery bypass graft.  SINGLE LEVEL ARTERIAL EXAM                              RIGHT              LEFT Brachial:                    211                200 Anterior tibial:             inaudible          216 Posterior tibial:            >255               174 Peroneal: Ankle/brachial index:        noncompressible    1.02  PREVIOUS ABI:  Date: 08/27/2008  RIGHT:  0.90  LEFT:  0.83  LOWER EXTREMITY BYPASS GRAFT DUPLEX EXAM:  DUPLEX:  Patent right popliteal to posterior tibial artery bypass graft with an increased velocity of 785 cm/sec. noted near or at the distal anastomosis.  IMPRESSION: 1. Limited study visualization due to lichenification, edema and     patient body habitus. 2. Patent bypass graft with elevated velocities near the area of the     distal anastomosis.  ___________________________________________ Jessy Oto Oneida Alar, MD  EM/MEDQ  D:  01/13/2011  T:  01/13/2011  Job:  [id number]

## 2011-01-24 NOTE — Assessment & Plan Note (Signed)
OFFICE VISIT  [name], [name] A DOB:  1951/04/04                                       01/13/2011 [id number]  The patient is a 60 year old male who I had previously performed a right popliteal to posterior tibial bypass on in 2006 for nonhealing right foot wound.  He had done well since then and the last time he had been seen in followup was in September of 2009.  He is referred back today by Dr. Jerline Pain for evaluation of lower extremity swelling and ulceration of his leg.  He has had chronic lower extremity swelling for some time and uses compression stockings to try to improve these symptoms.  CHRONIC MEDICAL PROBLEMS:  Include diabetes, hypertension, elevated cholesterol.  These are all currently controlled and followed by his primary care doctor.  SOCIAL HISTORY:  He is currently a Ship broker.  He is single.  He is a former smoker but quit over 30 years ago.  He does not consume alcohol regularly.  FAMILY HISTORY:  Is not remarkable for vascular disease at a young age.  PHYSICAL EXAM:  Vital signs:  Blood pressure is 196/96 in the left arm, heart rate 69 and regular, respirations 22.  Lower extremities:  He has 2+ femoral pulses bilaterally.  Popliteal and pedal pulses are difficult to palpate due to significant lower extremity edema.  He has chronic venous stasis and lymphedema type changes with lichenification of the skin bilaterally and darkish discoloration of the skin posteriorly.  He has some dry areas of skin as well.  Feet are pink, warm and well- perfused.  He had a venous duplex ultrasound today which showed no evidence of DVT. We did an arterial duplex of his bypass graft which appeared patent but it was a difficult exam again due to the lower extremity edema.  We attempted to perform ABIs but his vessels were noncompressible.  He had monophasic Doppler waveforms at the posterior tibial level bilaterally.  In summary, the patient has  chronic lower extremity edema which is primarily related to lymphedema.  His bypass graft appeared to be patent.  He has intermittently undergone episodes of compression therapy with Dr. Jerline Pain and I still believe that this is going to be the mainstay of therapy for him long-term.  Hopefully his bypass graft will remain patent but if this occludes at some point in the future he probably would not be a candidate for further revascularization due to the chronic degeneration of his lower extremities as he would be at very high risk for wound healing problems.  I discussed with the patient today compliance with compression garments and he seems to be willing to do this.  He will follow up with [place] on an as-needed basis.    Jessy Oto. Michell Giuliano, MD Electronically Signed  CEF/MEDQ  D:  01/13/2011  T:  01/14/2011  Job:  [id number]  cc:   Judene Companion, M.D.

## 2011-02-11 ENCOUNTER — Encounter (HOSPITAL_BASED_OUTPATIENT_CLINIC_OR_DEPARTMENT_OTHER): Payer: PRIVATE HEALTH INSURANCE | Attending: General Surgery

## 2011-02-11 DIAGNOSIS — I890 Lymphedema, not elsewhere classified: Secondary | ICD-10-CM | POA: Insufficient documentation

## 2011-02-26 ENCOUNTER — Emergency Department (HOSPITAL_COMMUNITY)
Admission: EM | Admit: 2011-02-26 | Discharge: 2011-02-26 | Disposition: A | Discharge status: Home / Self Care | Payer: Medicaid Other | Attending: Emergency Medicine | Admitting: Emergency Medicine

## 2011-02-26 DIAGNOSIS — S8990XA Unspecified injury of unspecified lower leg, initial encounter: Secondary | ICD-10-CM | POA: Insufficient documentation

## 2011-02-26 DIAGNOSIS — M25569 Pain in unspecified knee: Secondary | ICD-10-CM | POA: Insufficient documentation

## 2011-02-26 DIAGNOSIS — I10 Essential (primary) hypertension: Secondary | ICD-10-CM | POA: Insufficient documentation

## 2011-02-26 DIAGNOSIS — Z79899 Other long term (current) drug therapy: Secondary | ICD-10-CM | POA: Insufficient documentation

## 2011-02-26 DIAGNOSIS — E119 Type 2 diabetes mellitus without complications: Secondary | ICD-10-CM | POA: Insufficient documentation

## 2011-02-26 DIAGNOSIS — X500XXA Overexertion from strenuous movement or load, initial encounter: Secondary | ICD-10-CM | POA: Insufficient documentation

## 2011-02-26 DIAGNOSIS — E785 Hyperlipidemia, unspecified: Secondary | ICD-10-CM | POA: Insufficient documentation

## 2011-02-26 DIAGNOSIS — S99919A Unspecified injury of unspecified ankle, initial encounter: Secondary | ICD-10-CM | POA: Insufficient documentation

## 2011-02-28 NOTE — Procedures (Unsigned)
DUPLEX DEEP VENOUS EXAM - LOWER EXTREMITY  INDICATION:  Chronic insufficiency.  HISTORY:  Edema:  Yes. Trauma/Surgery:  No. Pain:  No. PE:  No. Previous DVT:  No. Anticoagulants:  Children's aspirin. Other:  Lymphedema, type 2 diabetes.  DUPLEX EXAM:               CFV   SFV   PopV  PTV    GSV               R  L  R  L  R  L  R   L  R  L Thrombosis    o  o  o  o  o  o         o  o Spontaneous   +  +  +  +  +  +         +  + Phasic        +  +  +  +  +  +         +  + Augmentation  +  +  +  +  +  +         +  + Compressible  +  +  +  +  +  +         +  + Competent     +  o  +  +  +  +         +  +  Legend:  + - yes  o - no  p - partial  D - decreased  IMPRESSION: 1. No evidence of deep venous thrombosis bilaterally. 2. Limited visualization due to a great deal of lichenification,     lymphedema and patient body habitus.   _____________________________ Jessy Oto. Oneida Alar, MD  EM/MEDQ  D:  01/13/2011  T:  01/13/2011  Job:  [id number]

## 2011-03-14 ENCOUNTER — Ambulatory Visit (INDEPENDENT_AMBULATORY_CARE_PROVIDER_SITE_OTHER): Payer: Medicaid Other | Admitting: Family Medicine

## 2011-03-14 ENCOUNTER — Ambulatory Visit (HOSPITAL_COMMUNITY)
Admission: RE | Admit: 2011-03-14 | Discharge: 2011-03-14 | Disposition: A | Discharge status: Home / Self Care | Payer: Medicaid Other | Source: Ambulatory Visit | Attending: Cardiology | Admitting: Cardiology

## 2011-03-14 ENCOUNTER — Other Ambulatory Visit: Payer: Self-pay

## 2011-03-14 ENCOUNTER — Ambulatory Visit: Payer: Medicaid Other | Admitting: Family Medicine

## 2011-03-14 DIAGNOSIS — Z136 Encounter for screening for cardiovascular disorders: Secondary | ICD-10-CM | POA: Insufficient documentation

## 2011-03-14 DIAGNOSIS — I10 Essential (primary) hypertension: Secondary | ICD-10-CM

## 2011-03-14 DIAGNOSIS — IMO0002 Reserved for concepts with insufficient information to code with codable children: Secondary | ICD-10-CM

## 2011-03-14 DIAGNOSIS — Z01818 Encounter for other preprocedural examination: Secondary | ICD-10-CM

## 2011-03-14 DIAGNOSIS — E1165 Type 2 diabetes mellitus with hyperglycemia: Secondary | ICD-10-CM

## 2011-03-14 DIAGNOSIS — E118 Type 2 diabetes mellitus with unspecified complications: Secondary | ICD-10-CM

## 2011-03-14 MED ORDER — CARVEDILOL 12.5 MG PO TABS: 25.0000 mg | ORAL_TABLET | Freq: Two times a day (BID) | ORAL | Status: DC

## 2011-03-14 MED ORDER — CARVEDILOL 12.5 MG PO TABS: 12.5000 mg | ORAL_TABLET | Freq: Two times a day (BID) | ORAL | Status: DC

## 2011-03-14 MED ORDER — ISOSORB DINITRATE-HYDRALAZINE 20-37.5 MG PO TABS: 1.0000 | ORAL_TABLET | Freq: Three times a day (TID) | ORAL | Status: DC

## 2011-03-14 NOTE — Progress Notes (Signed)
Subjective:    [name] is a 60 y.o. male who presents to the office today for a preoperative consultation at the request of orthopedic surgery who plans on performing right knee meniscal repair on Unk date depending on our assessment. . This consultation is requested for the specific conditions prompting preoperative evaluation (i.e. because of potential affect on operative risk): Right meniscal injury. Planned anesthesia: general. The patient has the following known anesthesia issues: none known. Patients bleeding risk: currently on ASA. Patient does not have objections to receiving blood products if needed.  The following portions of the patient's history were reviewed and updated as appropriate: allergies, current medications, past family history, past medical history, past social history, past surgical history and problem list.  Review of Systems Pertinent items are noted in HPI.    Objective:    BP 158/90  Pulse 80  Temp(Src) 98.2 F (36.8 C) (Oral)  Ht 5\' 11"  (1.803 m)  Wt 318 lb 9.6 oz (144.516 kg)  BMI 44.44 kg/m2 General appearance: alert, cooperative, appears stated age and morbidly obese Lungs: clear to auscultation bilaterally Heart: regular rate and rhythm, S1, S2 normal, no murmur, click, rub or gallop Abdomen: soft, non-tender; bowel sounds normal; no masses,  no organomegaly Extremities: venous stasis dermatitis noted and bilateral pressure dressings for venous stasis as well as antalgic gait due to knee injury.  ambulatory with a cane.  Predictors of intubation difficulty:  Morbid obesity? yes   Anatomically abnormal facies? no  Prominent incisors? no  Receding mandible? no  Short, thick neck? yes   Neck range of motion: normal  Mallampati score: III (soft and hard palate and base of uvula visible)  Thyromental distance: unk  Mouth opening:  Dentition: No chipped, loose, or missing teeth.  Cardiographics ECG: low voltage and sinus bradycardia with HR 52  no other abnormalities Echocardiogram: not done  Imaging Chest x-ray: not done   Lab Review  Lab Results  Component Value Date   NA 139 12/28/2010   K 4.2 12/28/2010   CL 104 12/28/2010   CO2 27 12/28/2010   BUN 18 12/28/2010   CREATININE 1.65* 12/28/2010   GLUCOSE 123* 12/28/2010   CALCIUM 9.0 12/28/2010   CALCIUM 9.0 05/18/2007      Assessment:      60 y.o. male with planned surgery as above.   Known risk factors for perioperative complications: Diabetes mellitus Renal dysfunction hypertension, obesity, impaired mobility   Difficulty with intubation is anticipated.  Cardiac Risk Estimation:  Current medications which may produce withdrawal symptoms if withheld perioperatively: ASA    Plan:    1. Preoperative workup as follows cardiac stress testing to exclude undiagnosed coronary disease (will discuss at next visit), chest x-ray, ECG. 2. Change in medication regimen before surgery: added bidil, discontinue ASA 7 days before surgery, continue current medications. . 3. Prophylaxis for cardiac events with perioperative beta-blockers: should be considered, specific regimen per anesthesia. 4. Invasive hemodynamic monitoring perioperatively: should be considered. 5. Deep vein thrombosis prophylaxis postoperatively:regimen to be chosen by surgical team. 6. Surveillance for postoperative MI with ECG immediately postoperatively and on postoperative days 1 and 2 AND troponin levels 24 hours postoperatively and on day 4 or hospital discharge (whichever comes first): at the discretion of anesthesiologist. 7. Other measures: Careful attention to perioperative glycemic control (check accuchecks). Postoperative cardiac telemetry (for 2-3 days). Postoperative monitoring of serum creatinine (1-2 days) and strict intake and output until discharged.

## 2011-03-14 NOTE — Patient Instructions (Addendum)
It was a pleasure to care for you today.  Please make a follow up appointment for one week for BP check, continued surgical clearance, and follow up of results. Pt may need stress testing as well be it that the plan is to use general anesthesia for his knee surgery.  Will address at next visit.  EKG is currently with Sinus bradycardia HR 52 and no evidence of LVH.  Diffuse low voltage.  Continue coreg at that same dosage.  I added bidil to your current regiment.  Return for BP check.

## 2011-03-15 ENCOUNTER — Other Ambulatory Visit (INDEPENDENT_AMBULATORY_CARE_PROVIDER_SITE_OTHER): Payer: Medicaid Other

## 2011-03-15 DIAGNOSIS — I10 Essential (primary) hypertension: Secondary | ICD-10-CM

## 2011-03-15 DIAGNOSIS — E1165 Type 2 diabetes mellitus with hyperglycemia: Secondary | ICD-10-CM

## 2011-03-15 DIAGNOSIS — E118 Type 2 diabetes mellitus with unspecified complications: Secondary | ICD-10-CM

## 2011-03-15 LAB — COMPREHENSIVE METABOLIC PANEL
Albumin: 3.6 g/dL (ref 3.5–5.2)
Alkaline Phosphatase: 71 U/L (ref 39–117)
BUN: 27 mg/dL — ABNORMAL HIGH (ref 6–23)
CO2: 23 mEq/L (ref 19–32)
Glucose, Bld: 118 mg/dL — ABNORMAL HIGH (ref 70–99)
Potassium: 4.7 mEq/L (ref 3.5–5.3)
Sodium: 139 mEq/L (ref 135–145)
Total Protein: 7.1 g/dL (ref 6.0–8.3)

## 2011-03-15 NOTE — Progress Notes (Signed)
Drew Blood for CMET and Hgb A1C; PT was unable void; did not order Urine Microalbumin [employer]

## 2011-03-25 ENCOUNTER — Other Ambulatory Visit (HOSPITAL_BASED_OUTPATIENT_CLINIC_OR_DEPARTMENT_OTHER): Payer: Self-pay | Admitting: General Surgery

## 2011-03-25 ENCOUNTER — Encounter (HOSPITAL_BASED_OUTPATIENT_CLINIC_OR_DEPARTMENT_OTHER): Payer: Medicaid Other

## 2011-03-25 ENCOUNTER — Ambulatory Visit (HOSPITAL_COMMUNITY)
Admission: RE | Admit: 2011-03-25 | Discharge: 2011-03-25 | Disposition: A | Discharge status: Home / Self Care | Payer: Medicare Other | Source: Ambulatory Visit | Attending: General Surgery | Admitting: General Surgery

## 2011-03-25 ENCOUNTER — Encounter (HOSPITAL_BASED_OUTPATIENT_CLINIC_OR_DEPARTMENT_OTHER): Payer: Medicare Other | Attending: General Surgery

## 2011-03-25 DIAGNOSIS — M869 Osteomyelitis, unspecified: Secondary | ICD-10-CM

## 2011-03-25 DIAGNOSIS — M7730 Calcaneal spur, unspecified foot: Secondary | ICD-10-CM | POA: Insufficient documentation

## 2011-03-25 DIAGNOSIS — E119 Type 2 diabetes mellitus without complications: Secondary | ICD-10-CM | POA: Insufficient documentation

## 2011-03-25 DIAGNOSIS — I890 Lymphedema, not elsewhere classified: Secondary | ICD-10-CM | POA: Insufficient documentation

## 2011-03-25 DIAGNOSIS — M7989 Other specified soft tissue disorders: Secondary | ICD-10-CM | POA: Insufficient documentation

## 2011-03-28 DIAGNOSIS — I739 Peripheral vascular disease, unspecified: Secondary | ICD-10-CM

## 2011-03-31 ENCOUNTER — Ambulatory Visit (INDEPENDENT_AMBULATORY_CARE_PROVIDER_SITE_OTHER): Payer: Medicaid Other | Admitting: Family Medicine

## 2011-03-31 DIAGNOSIS — I10 Essential (primary) hypertension: Secondary | ICD-10-CM

## 2011-03-31 DIAGNOSIS — E785 Hyperlipidemia, unspecified: Secondary | ICD-10-CM

## 2011-03-31 MED ORDER — SIMVASTATIN 80 MG PO TABS: 80.0000 mg | ORAL_TABLET | Freq: Every day | ORAL | Status: DC

## 2011-03-31 MED ORDER — AMLODIPINE BESYLATE 10 MG PO TABS: 10.0000 mg | ORAL_TABLET | Freq: Every day | ORAL | Status: DC

## 2011-03-31 MED ORDER — ISOSORB DINITRATE-HYDRALAZINE 20-37.5 MG PO TABS: 1.0000 | ORAL_TABLET | Freq: Three times a day (TID) | ORAL | Status: DC

## 2011-03-31 NOTE — Progress Notes (Signed)
Subjective:    Patient here for follow-up of elevated blood pressure.  He is not exercising and is adherent to a low-salt diet.  Blood pressure is not well controlled at home. Cardiac symptoms: none. Patient denies: chest pain, chest pressure/discomfort, dyspnea, exertional chest pressure/discomfort and irregular heart beat. Cardiovascular risk factors: advanced age (older than 54 for men, 87 for women), diabetes mellitus, dyslipidemia, male gender, obesity (BMI >= 30 kg/m2) and sedentary lifestyle. Use of agents associated with hypertension: NSAIDS. History of target organ damage: chronic kidney disease.  Pt states not being compliant with bidil and did not fill the Rx from last visit.  Pt currently is in for continued preop clearance for knee surgery requiring general anesthesia.  He endorses being able to walk about a 1/2 mile without symptoms.  He denies any CP, exertional chest pain, dyspnea, or syncope.  Additionally pt states he has now developed a left foot pressure ulcer being cared for by the wound center.     The following portions of the patient's history were reviewed and updated as appropriate: allergies, current medications, past family history, past medical history, past social history, past surgical history and problem list.  Review of Systems Pertinent items are noted in HPI.     Objective:    BP 217/83  Pulse 57  Temp(Src) 98.1 F (36.7 C) (Oral)  Ht 5\' 6"  (1.676 m)  Wt 322 lb 4.8 oz (146.194 kg)  BMI 52.02 kg/m2  General Appearance:    Alert, cooperative, no distress, appears stated age  Head:    Normocephalic, without obvious abnormality, atraumatic  Eyes:    PERRL, conjunctiva/corneas clear, EOM's intact, fundi    benign, both eyes       Ears:    Normal TM's and external ear canals, both ears  Nose:   Nares normal, septum midline, mucosa normal, no drainage    or sinus tenderness  Throat:   Lips, mucosa, and tongue normal; teeth and gums normal  Neck:   Supple,  symmetrical, trachea midline, no adenopathy;       thyroid:  No enlargement/tenderness/nodules; no carotid   bruit or JVD  Back:     Symmetric, no curvature, ROM normal, no CVA tenderness  Lungs:     Clear to auscultation bilaterally, respirations unlabored  Chest wall:    No tenderness or deformity  Heart:    Regular rate and rhythm, S1 and S2 normal, no murmur, rub   or gallop  Abdomen:     Soft, non-tender, bowel sounds active all four quadrants,    no masses, no organomegaly  Genitalia:    Normal male without lesion, discharge or tenderness  Rectal:    Normal tone, normal prostate, no masses or tenderness;   guaiac negative stool  Extremities:   Extremities normal, atraumatic, no cyanosis or edema  Pulses:   2+ and symmetric all extremities  Skin:   Skin color, texture, turgor normal, no rashes or lesions  Lymph nodes:   Cervical, supraclavicular, and axillary nodes normal  Neurologic:   CNII-XII intact. Normal strength, sensation and reflexes      throughout      Assessment:    Hypertension, stage 2 uncontrolled. Evidence of target organ damage: chronic kidney disease.    Plan:    Medication: begin bidil as prescribed. Dietary sodium restriction. Follow up: 1 week and as needed.  ? Stress test for preop clearance.  After BP controlled.

## 2011-03-31 NOTE — Patient Instructions (Signed)
It was a pleasure to care for you today.  Please take your medication as prescribed and take new blood pressure medication three times daily.   Please return in 1-2 weeks for BP check and further preop clearance.

## 2011-04-15 ENCOUNTER — Encounter (HOSPITAL_BASED_OUTPATIENT_CLINIC_OR_DEPARTMENT_OTHER): Payer: Medicare Other | Attending: General Surgery

## 2011-04-15 DIAGNOSIS — I120 Hypertensive chronic kidney disease with stage 5 chronic kidney disease or end stage renal disease: Secondary | ICD-10-CM | POA: Insufficient documentation

## 2011-04-15 DIAGNOSIS — N186 End stage renal disease: Secondary | ICD-10-CM | POA: Insufficient documentation

## 2011-04-15 DIAGNOSIS — I509 Heart failure, unspecified: Secondary | ICD-10-CM | POA: Insufficient documentation

## 2011-04-15 DIAGNOSIS — E1159 Type 2 diabetes mellitus with other circulatory complications: Secondary | ICD-10-CM | POA: Insufficient documentation

## 2011-04-15 DIAGNOSIS — I890 Lymphedema, not elsewhere classified: Secondary | ICD-10-CM | POA: Insufficient documentation

## 2011-04-15 DIAGNOSIS — I872 Venous insufficiency (chronic) (peripheral): Secondary | ICD-10-CM | POA: Insufficient documentation

## 2011-04-15 DIAGNOSIS — Z7982 Long term (current) use of aspirin: Secondary | ICD-10-CM | POA: Insufficient documentation

## 2011-04-15 DIAGNOSIS — Z86718 Personal history of other venous thrombosis and embolism: Secondary | ICD-10-CM | POA: Insufficient documentation

## 2011-04-15 DIAGNOSIS — L97509 Non-pressure chronic ulcer of other part of unspecified foot with unspecified severity: Secondary | ICD-10-CM | POA: Insufficient documentation

## 2011-04-15 DIAGNOSIS — Z79899 Other long term (current) drug therapy: Secondary | ICD-10-CM | POA: Insufficient documentation

## 2011-04-17 ENCOUNTER — Other Ambulatory Visit: Payer: Self-pay | Admitting: Family Medicine

## 2011-04-18 NOTE — Telephone Encounter (Signed)
Refill request

## 2011-04-18 NOTE — Telephone Encounter (Signed)
refill 

## 2011-04-22 ENCOUNTER — Encounter (HOSPITAL_BASED_OUTPATIENT_CLINIC_OR_DEPARTMENT_OTHER): Payer: Medicaid Other

## 2011-04-26 NOTE — Procedures (Signed)
BYPASS GRAFT EVALUATION   INDICATION:  Follow up right popliteal to posterior tibial artery  bypass.   HISTORY:  Diabetes:  Yes.  Cardiac:  No.  Hypertension:  Yes.  Smoking:  Previous.  Previous Surgery:  Please see above.   SINGLE LEVEL ARTERIAL EXAM                               RIGHT              LEFT  Brachial:                    198                221  Anterior tibial:             194                183  Posterior tibial:            200                171  Peroneal:  Ankle/brachial index:        0.90               0.83   PREVIOUS ABI:  Date: 05/14/08  RIGHT:  0.88  LEFT:  0.73   LOWER EXTREMITY BYPASS GRAFT DUPLEX EXAM:   DUPLEX:  Patent right popliteal to posterior tibial artery bypass with  high-grade stenosis noted at a distal bypass graft.   IMPRESSION:  1. Patent right popliteal to posterior tibial artery bypass graft with      high-grade stenosis noted at a distal bypass graft.  2. Mildly abnormal ankle brachial index with monophasic Doppler      waveform noted in bilateral legs.  3. Status post right popliteal to posterior tibial artery bypass      graft.   ___________________________________________  Jessy Oto. Fields, MD   MG/MEDQ  D:  08/27/2008  T:  08/27/2008  Job:  [id number]

## 2011-04-26 NOTE — Assessment & Plan Note (Signed)
OFFICE VISIT   Patrick, [name] A  DOB:  06-16-1951                                       08/27/2008  [id number]   The patient returns for followup today.  He previously underwent a right  popliteal to posterior tibial bypass in June of 2006.  Three months ago  he had a duplex ultrasound which suggested he may have some distal  narrowing of the bypass graft.  He wished to defer having an arteriogram  done at that time.  He returns for further followup today.   His primary atherosclerotic risk factors include diabetes and  hypertension.  He is currently on glipizide, hydrochlorothiazide,  aspirin, ibuprofen and enalapril.  He has a remote history of smoking  but quit in 1980.   PHYSICAL EXAMINATION:  On physical exam today blood pressure is 184/94  in the left arm, pulse is 60 and regular.  HEENT:  Is unremarkable.  Chest:  Clear to auscultation.  Cardiac:  Regular rate and rhythm.  Lower extremities:  He has 2+ femoral pulses bilaterally.  He has very  thickened skin in the lower extremities bilaterally which makes pulses  difficult to palpate.  I am unable to palpate popliteal or pedal pulses  bilaterally.  He has a hypertrophic scar in the right medial aspect of  his leg from his previous bypass operation.  Both feet are warm, pink  and well-perfused.   ABIs today were 0.9 on the right, 0.83 on the left.  There is increased  velocity in the distal aspect of the bypass graft with a peak systolic  velocity of XX123456 cm/sec.  This is similar to the velocity seen 3 months  ago.   I discussed with the patient today again a right lower extremity  arteriogram.  He wishes to undergo the procedure at this point.  We have  scheduled this for 09/05/2008.  The risks, benefits, possible  complications of procedure details were explained to the patient.  He  understands and agrees to proceed.   Jessy Oto. Fields, MD  Electronically Signed   CEF/MEDQ  D:   08/27/2008  T:  08/28/2008  Job:  [id number]

## 2011-04-26 NOTE — Op Note (Signed)
NAMEGEORGY, STAPLE               ACCOUNT NO.:  [id number]   MEDICAL RECORD NO.:  [id number]          PATIENT TYPE:  AMB   LOCATION:  [facility]                         FACILITY:  [facility]   PHYSICIAN:  Marshall Cork. Jeffie Pollock, M.D.    DATE OF BIRTH:  12-31-1950   DATE OF PROCEDURE:  05/15/2007  DATE OF DISCHARGE:                               OPERATIVE REPORT   SERVICE:  Urology.   PREOPERATIVE DIAGNOSIS:  Right hydrocele.   POSTOPERATIVE DIAGNOSIS:  Right hydrocele.   PROCEDURE:  Right hydrocelectomy.   SURGEON:  Marshall Cork. Jeffie Pollock, M.D.   RESIDENT:  Peterson Lombard, M.D.   ANESTHESIA:  General.   COMPLICATIONS:  None.   ESTIMATED BLOOD LOSS:  Minimal.   DISPOSITION:  Stable to postanesthesia care unit.   INDICATION FOR PROCEDURE:  Mr. Boughter is a 60 year old gentleman who has  symptomatic right-sided scrotal swelling.  He has been evaluated with  ultrasound which shows a right-sided hydrocele.  He was counseled  regarding benefits and risks of surgical management and he has agreed to  proceed with hydrocelectomy.  Benefits and risks of the procedure were  explained and his consent was obtained.   DESCRIPTION OF PROCEDURE:  The patient brought to the operating room and  properly identified.  Time-out was performed to confirm correct patient,  procedure and side.  He was administered general anesthesia, given  preoperative antibiotics and placed in supine position on the operating  table and prepped and draped in a sterile fashion.  We began by making a  transverse incision along the right hemiscrotum.  Dissection was carried  down through the dartos layers and the hydrocele was delivered.  The  surrounding layers were was bluntly peeled off of the hydrocele sac.  The sac was then entered sharply and approximately 70 mL of clear straw-  colored fluid was evacuated.  The testicle and cord were inspected and  were without abnormality.  A small portion of the hydrocele sac was  excised.  The sac  was then everted and closed in a bottleneck fashion  using a running locking 3-0 chromic suture.  Care was taken not to place  excessive tension on the suture as to compromise the vascular supply to  the testicle.  We then copiously irrigated the wound and replaced the  testicle in the right hemiscrotum.  A quarter-inch Penrose drain was  placed through a separate stab incision in the anterior portion of the  scrotum.  We then closed the dartos layer in a running manner with 3-0  chromic suture.  Skin was then closed in interrupted vertical mattress  sutures with 3-0 chromic.  A sterile dressing and fluff gauze and  scrotal support were placed.  There were no  complications.  All sponge and needle counts were correct at the end of  the case.  The patient was then awoken from anesthesia and transported  to the recovery room in stable condition.  Please note, Dr. Jeffie Pollock was  present and participated in all aspects of this procedure as he was the  primary surgeon..     ______________________________  Peterson Lombard, MD      Marshall Cork. Jeffie Pollock, M.D.  Electronically Signed    JH/MEDQ  D:  05/15/2007  T:  05/15/2007  Job:  [id number]

## 2011-04-26 NOTE — Procedures (Signed)
BYPASS GRAFT EVALUATION   INDICATION:  Followup of right fem to PTA BPG with reverse saphenous  vein on 05/26/2005 by Dr. Oneida Alar.  Patient denies claudication and rest  pain.  Patient with bilateral calf swelling, right greater than left,  for approximately five months.  Right leg with blistering and cracking  skin for eight months.  Left leg blisters for 3-4 weeks.   HISTORY:  Diabetes:  Yes.  Cardiac:  No.  Hypertension:  Yes.  Smoking:  No.  Previous Surgery:  As stated above.   SINGLE LEVEL ARTERIAL EXAM                               RIGHT              LEFT  Brachial:                    166                168  Anterior tibial:             164                154  Posterior tibial:            172                150  Peroneal:  Ankle/brachial index:        1.02               0.92   PREVIOUS ABI:  Date: 06/11/07  RIGHT:  1.01  LEFT:  1.01   LOWER EXTREMITY BYPASS GRAFT DUPLEX EXAM:   DUPLEX:  1. Patent common femoral artery without evidence of stenosis.  2. Mid distal to distal superficial femoral artery with 30-49%      stenosis.  3. Patent right femoral to posterior tibial artery bypass graft with      velocities ranging from 61 cm/s to 598 cm/s with highest velocity      noted at the distal graft, representing a >75% stenosis, which is      stable.   IMPRESSION:  1. Right ankle brachial index stable.  2. Slight decrease in left ankle brachial index.  3. Mid distal to distal superficial femoral artery with 30-49%      stenosis.  4. Patent right femoral to posterior tibial artery bypass graft with      the highest velocity of 598 cm/s noted at the distal graft,      representing >75% stenosis, which is stable.   ___________________________________________  Jessy Oto. Fields, MD   PB/MEDQ  D:  11/28/2007  T:  11/29/2007  Job:  [id number]

## 2011-04-26 NOTE — Assessment & Plan Note (Signed)
[facility]   NAME:  [name], [name]               ACCOUNT NO.:  [id number]   MEDICAL RECORD NO.:  [id number]      DATE OF BIRTH:  Jan 01, 1951   PHYSICIAN:  Elesa Hacker, M.D.         VISIT DATE:  05/27/2009                                   OFFICE VISIT   CHIEF COMPLAINT:  Swelling and skin changes of both lower extremities.   HISTORY OF PRESENT ILLNESS:  This patient underwent a fem-pop bypass on  the right side approximately in 2006 and 8 months or so after that  developed keloids of the wounds and a great deal of swelling of the leg  and not only is the right leg swollen where the fem-pop was done but  also the left leg.  Diagnosis here has been lymphedema.  The patient has  blood and bone marrow changes suggestive of multiple myeloma, but this  was thought to be a quiescent condition.   PAST MEDICAL HISTORY:  He has  peripheral vascular disease.  no  hypertension, or diabetes.  He has a history of osteomyelitis.   PAST SURGICAL HISTORY:  He has had amputation of the right fifth  metatarsal, cataract surgery, umbilical hernia repair, and fem-pop  popped bypass on the right side.   MEDICATIONS:  Children's aspirin, Glucotrol, simvastatin, Norvasc,  enalapril, Actos, carvedilol, furosemide, and Aldactone.   ALLERGIES:  He is evidently allergic to [facility].   PHYSICAL EXAMINATION:  Eyes, ears, nose, and throat normal.  Neck  supple.  I could not hear a murmur or gallop.  He is told that there was  an abnormal finding in the left carotid and he is scheduled for carotid  angiogram or ultrasound.  Chest is clear.  The abdomen is obese without  palpable mass or organomegaly.  Examination of the lower extremities  reveals a great deal of swelling bilaterally, right is worse than left.  There is a great deal of lichenification, but no open wounds of either  leg.  There is no sign of poor skin nutrition.  I believe the pulses are  palpable.   IMPRESSION:  Bilateral  lymphedema in a patient with multiple medical  problems including cardiovascular disease, diabetes, hypertension, and  multiple myeloma.   RECOMMENDATIONS:  We have considered pressure stockings and edema pump,  but in the view of the fact that he has had peripheral vascular disease  and the likely that this will not be very effective we have do not want  anything.  We are wondering if he should see a dermatologist and whether  or not further workup for lymphoma is indicated in the view of the  bilateral lymphedema.  I will discuss this with his medical doctor.      Elesa Hacker, M.D.  Electronically Signed     RA/MEDQ  D:  05/27/2009  T:  05/28/2009  Job:  [id number]

## 2011-04-26 NOTE — Assessment & Plan Note (Signed)
OFFICE VISIT   [name], [name] A  DOB:  1951-06-12                                       11/28/2007  [id number]   The patient returns for followup after his right popliteal to posterior  tibial bypass in June of 2006.  He has had chronic leg swelling of his  lower extremity since then.  He had a graft duplex scan today, which  again showed elevated velocities in the distal posterior tibial artery  of 598 cm per second.  This is very similar to his previous graft duplex  in June 2008.  There is also an elevated velocity in December of 2006.  This was evaluated with arteriogram, which showed no significant flow-  limiting stenosis.  He has had chronic leg swelling for some time and  states that he previously wore compression stockings, but these have  worn out.   PHYSICAL EXAM:  Blood pressure 189/84, heart rate is 73 and regular.  Both feet are warm and well-perfused. He has a woody-type edema in both  lower extremities.  There is old scarring on the lateral anterior  compartments from previous blistering from edema.  He has no open  ulcerations on the feet.  He has scaly dry skin.  He also has a history  of diabetes.   I have counseled the patient today in placing a moisturizing lotion on  his lower extremities to help with the dry skin.  I also prescribed for  him a new pair of compression stockings to help with the leg edema,  although the duplex scan has shown some elevated velocities distally,  this arteriogram showed that there is no flow-limiting stenosis.  His  legs are quite edematous at this point.  If his duplex exam persists or  increases at his next appointment in 6 months' time, we  would consider a repeat arteriogram at that time.  I also told the  patient that if he has problems with his feet beginning to hurt or open  sores that are not healing, he should return sooner.   Jessy Oto. Fields, MD  Electronically Signed   CEF/MEDQ   D:  11/28/2007  T:  11/29/2007  Job:  [id number]

## 2011-04-26 NOTE — Op Note (Signed)
[name], [name]               ACCOUNT NO.:  [id number]   MEDICAL RECORD NO.:  [id number]          PATIENT TYPE:  AMB   LOCATION:  [facility]                          FACILITY:  [facility]   PHYSICIAN:  Jessy Oto. Fields, MD  DATE OF BIRTH:  Oct 05, 1951   DATE OF PROCEDURE:  09/05/2008  DATE OF DISCHARGE:  09/05/2008                               OPERATIVE REPORT   PROCEDURE:  Right lower extremity arteriogram.   PREOPERATIVE DIAGNOSIS:  Vein graft stenosis.   POSTOPERATIVE DIAGNOSIS:  Vein graft stenosis.   ANESTHESIA:  Local.   INDICATIONS:  The patient has a history of a right popliteal to  posterior tibial bypass.  Recent duplex ultrasound suggested high-grade  stenosis near the distal anastomosis.  He presents for elective  arteriogram for further examination of his bypass graft.   OPERATIVE DETAILS:  After obtaining informed consent, the patient was  taken to the [facility] lab.  The patient was placed in supine position on the  angio table.  Both groins were prepped and draped in usual sterile  fashion.  Local anesthesia was then infiltrated over the left common  femoral artery.  A Majestic needle was used to cannulate the left common  femoral artery and a 0.035 Wholey wire advanced into the abdominal aorta  under fluoroscopic guidance.  Next, a 5-French sheath was placed over  the guidewire in the left common femoral artery.  This was thoroughly  flushed with heparinized saline.  A 5-French crossover catheter was then  brought up in the operative field and this was used to selectively  catheterize the right common iliac artery.  A 0.035 angled Glidewire was  then used to selectively catheterize the right common iliac artery and  then advance into the external iliac artery and down into the right  superficial femoral artery.  The crossover catheter was then removed and  a 5-French end-hole catheter placed over the guidewire down into the  superficial femoral artery.  Right lower extremity  arteriogram was then  obtained.  This shows a widely patent superficial femoral artery.  There  is a bypass graft, which comes off the below-knee popliteal artery and  is widely patent throughout its course.  The distal anastomosis is to  the posterior tibial artery at the level of the ankle.  There is no  significant flow-limiting stenosis.  A lateral foot view was also  obtained in this projection, and there was also no obvious stenosis of  the bypass graft.  Of note, the patient's native circulation shows no  named tibial vessels throughout the calf and the only runoff vessel he  has is the posterior tibial artery at the level of the ankle.   Next, the 5-French end-hole catheter was pulled back over a guidewire  and a 5-French sheath was left in place to be pulled in the holding  area.   OPERATIVE FINDINGS:  Patent right below-knee popliteal to posterior  tibial bypass graft with no significant stenosis.      Jessy Oto. Fields, MD  Electronically Signed     CEF/MEDQ  D:  09/05/2008  T:  09/06/2008  Job:  [id number]

## 2011-04-26 NOTE — Procedures (Signed)
BYPASS GRAFT EVALUATION   INDICATION:  Followup, right poplitea to PT bypass graft.   HISTORY:  Diabetes:  Yes.  Cardiac:  No.  Hypertension:  Yes.  Smoking:  No.  Previous Surgery:  Please see above.   SINGLE LEVEL ARTERIAL EXAM                               RIGHT              LEFT  Brachial:                    192                208  Anterior tibial:             183                151  Posterior tibial:            Scleroderma        Scleroderma  Peroneal:  Ankle/brachial index:        0.88               0.73   PREVIOUS ABI:  Date: 11/28/07  RIGHT:  1.02  LEFT:  0.92   LOWER EXTREMITY BYPASS GRAFT DUPLEX EXAM:   DUPLEX:  Patent right fem-pop with high-grade stenosis noted at the  distal bypass graft (616/167).   IMPRESSION:  1. Patent right popliteal to posterior tibial artery bypass graft with      high-grade stenosis noted at the distal bypass graft (616/167).  2. Mildly abnormal ankle brachial index with biphasic Doppler waveform      noted in bilateral anterior tibial artery.  3. Unable to obtain bilateral posterior tibial artery Doppler and      pressure due to scleroderma.  4. Status post right popliteal to posterior tibial artery bypass      graft.   ___________________________________________  Jessy Oto. Fields, MD   MG/MEDQ  D:  05/14/2008  T:  05/14/2008  Job:  [id number]

## 2011-04-26 NOTE — Procedures (Signed)
BYPASS GRAFT EVALUATION   INDICATION:  Followup right fem posterior tibial artery bypass graft.   HISTORY:  Diabetes:  Yes  Cardiac:  No  Hypertension:  Yes  Smoking:  Quit about two years ago  Previous Surgery:  Please see above -- single level arterial exam.   SINGLE LEVEL ARTERIAL EXAM                               RIGHT              LEFT  Brachial:                    142                140  Anterior tibial:             144                143  Posterior tibial:            134                110  Peroneal:  Ankle/brachial index:        1.01               1.01   PREVIOUS ABI:  Date: 12/06/2005  RIGHT:  Greater than 1.  LEFT:  Greater  than 1.   LOWER EXTREMITY BYPASS GRAFT DUPLEX EXAM:   DUPLEX:  Patent right popliteal to posterior tibial artery bypass graft  with evidence of focal stenosis noted at a distal graft (532/80).   IMPRESSION:  1. Patent right popliteal posterior tibial artery bypass graft with      evidence of focal stenosis noted at a      distal graft (532/80).  2. Normal ABI with biphasic Doppler waveform noted and bilateral legs.   ___________________________________________  P. Drucie Opitz, M.D.   MG/MEDQ  D:  06/12/2007  T:  06/13/2007  Job:  [id number]

## 2011-04-26 NOTE — Assessment & Plan Note (Signed)
OFFICE VISIT   [name], [name]  DOB:  [birth year]                                       05/21/2008  [id number]   The patient returns for further followup today.  He underwent [name] right  popliteal to posterior tibial bypass with vein in 2006.  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  He has not had any recurrence of symptoms in his leg.  He  does have fairly severe chronic edema changes in the leg and also very  thickened skin in the right lower extremity.  The right foot is warm and  well-perfused.  Blood pressure today was 173/76, pulse 73 and regular.   I reviewed his duplex ultrasound from last week which showed increased  velocities near the distal anastomosis which had been previously seen.  I believe the best option for the patient would be further evaluation  with arteriography to see if he has any vein graft stenosis or if he has  developed disease distal to the bypass.  Currently, however, he has  several social problems that are going on as far as being laid off from  work and trying to establish [name] new job.  I did caution him that if his  bypass graft occludes he will probably lose his leg.  However, he  insists that he needs to wait on having the arteriogram performed at  this time.  He will call back if he wishes to have the arteriogram  scheduled.  Otherwise he will follow up in three months' time for [name]  repeat duplex exam.   Jessy Oto. Fields, MD  Electronically Signed   CEF/MEDQ  D:  05/21/2008  T:  05/22/2008  Job:  [id number]   cc:   Lauraine Rinne, MD

## 2011-04-29 ENCOUNTER — Other Ambulatory Visit (HOSPITAL_BASED_OUTPATIENT_CLINIC_OR_DEPARTMENT_OTHER): Payer: Self-pay | Admitting: General Surgery

## 2011-04-29 LAB — GLUCOSE, CAPILLARY: Glucose-Capillary: 211 mg/dL — ABNORMAL HIGH (ref 70–99)

## 2011-04-29 NOTE — Discharge Summary (Signed)
[name], [name]               ACCOUNT NO.:  [id number]   MEDICAL RECORD NO.:  [id number]          PATIENT TYPE:  INP   LOCATION:  5707                         FACILITY:  [facility]   PHYSICIAN:  Verner Chol, MD DATE OF BIRTH:  [birth year]   DATE OF ADMISSION:  12/29/2004  DATE OF DISCHARGE:  01/05/2005                                 DISCHARGE SUMMARY   ADDENDUM:   DISCHARGE MEDICATIONS:  1.  Glucotrol 10 mg p.o. b.i.d.  Take 30 minutes before meals.  2.  Glucophage 1000 mg p.o. b.i.d.  Take with meals.  3.  Lisinopril 20 mg p.o. daily.  4.  HCTZ 25 mg p.o. daily.  5.  Aspirin 81 mg p.o. daily.  6.  Tylenol 500 mg one tablet p.o. q.4-6h. p.r.n. pain.   Note that these are the new dosages for this patient for Glucotrol and  Glucophage to be treated as an outpatient.  Ignore the dosages in the first  dictation for these medications.   HOSPITAL COURSE:  #1 - RIGHT FOOT DIABETIC ULCER WITH FIFTH METATARSAL  OSTEOMYELITIS:  On January 04, 2005, orthopedics performed a second I&D.  From the orthopedic standpoint, the patient is ready to go home.  Dr. Louanne Skye  (orthopedics) will follow up this patient twice a week until the ulcer and  osteomyelitis are healed.  The patient will receive Levaquin 750 mg through  PICC line daily.  The patient will receive IV Levaquin as an outpatient at  [facility]  Before discharge home, the patient will receive instructions  to take care of the wound on the right foot and the materials needed to do  it.  The patient will complete a six-week course of Levaquin and then PICC  line will be discontinued.   #2 - DIABETES TYPE 2:  Medications as outpatient have been changed to  Glucotrol 10 mg p.o. b.i.d. and Glucophage 1000 mg p.o. b.i.d.  PCP, Dr.  Mellody Drown, will follow up as an outpatient.   #3 - HYPERTENSION:  On the date of discharge, January 05, 2005, blood  pressures are stable.  Isolated episode of high blood pressure of 172/108.  No further  changes on blood pressure medications.  This issue is to be  followed by PCP, Dr. Mellody Drown, as an outpatient.   #4 - POTASSIUM:  Stable at discharge at 3.8.   FOLLOWUP APPOINTMENTS:  1.  Dr. Otho Ket office at the [facility] on January 10, 2005, at 10 a.m.  2.  Outpatient for IV antibiotics at [facility].  Present to the admitting      office on January 06, 2005, at 12 noon.  3.  Dr. Mellody Drown on January 10, 2005, at 2:45 p.m. at the family practice      center at [facility].   DISPOSITION:  Discharged home.   CONDITION ON DISCHARGE:  Stable.      FIM/MEDQ  D:  01/05/2005  T:  01/05/2005  Job:  [id number]

## 2011-04-29 NOTE — Op Note (Signed)
NAME:  CALYB, SIMONSON               ACCOUNT NO.:  [id number]   MEDICAL RECORD NO.:  [id number]          PATIENT TYPE:  INP   LOCATION:  0001                         FACILITY:  [facility]   PHYSICIAN:  Jessy Oto, M.D.   DATE OF BIRTH:  30-Nov-1951   DATE OF PROCEDURE:  04/20/2005  DATE OF DISCHARGE:                                 OPERATIVE REPORT   PREOPERATIVE DIAGNOSIS:  Neurotrophic ulcer underlying right fifth  metatarsophalangeal joint, status post multiple debridements of  osteomyelitis with apparent osteomyelitis changes involving the proximal  portion of the right fifth metatarsal.   POSTOPERATIVE DIAGNOSIS:  Neurotrophic ulcer underlying right fifth  metatarsophalangeal joint, status post multiple debridements of  osteomyelitis with apparent osteomyelitis changes involving the proximal  portion of the right fifth metatarsal.   PROCEDURE:  Right fifth ray deletion with amputation of the right fifth toe,  closure over packing of right lateral foot.   SURGEON:  Jessy Oto, M.D.   ASSISTANT:  Epimenio Foot, P.A.   ANESTHESIA:  GOT, Bruce J. Delma Post, M.D.   ESTIMATED BLOOD LOSS:  75 cc.   DRAINS:  The wound packed open with 1/2 NuGauze wet-to-dry.   COMPLICATIONS:  None.   Stat Gram stain obtained intraoperatively showed no organisms seen.  There  were rare white cells, mostly polymorphonucleocytes and monocytes.   HISTORY OF PRESENT ILLNESS:  A 60 year old male with new-onset diabetes in  January of this past year, found to have an ulcer with cellulitis underlying  the right fifth metatarsophalangeal joint. An MRI scan demonstrated very  minimal amount of ostial changes involving the metatarsal head. He underwent  IV antibiotic treatment and local dressing changes;  however, the  osteomyelitis changes progressed to lucency involving the metatarsal head  and drainage from the metatarsophalangeal joint. He underwent a resection of  the metatarsophalangeal joint  and postoperatively continued drainage. He  appeared to be healing the wound too rapidly and continued to drain sinus  drainage from the area of the resected metatarsophalangeal joint, with  follow-up radiographs demonstrating progressive lucency involving the  metatarsal shaft of the right fifth metatarsal. He was brought back to the  operating room to undergo a right fifth ray deletion. This with it left open  because of osteomyelitis changes.   DESCRIPTION OF PROCEDURE:  After adequate general anesthesia, the right  lower extremity prepped with Betadine scrub and prep solution, draped in the  usual manner, tourniquet about the right upper thigh, the right lower  extremity elevated, and a tourniquet inflated to 300 mmHg after 45 seconds  of elevation at 45 degrees. The initial incision, a racket-type incision  around the base of the fifth toe continued along the plantar aspect of the  fifth digit in line with the ulcer ellipsing the ulcer nicely. Incision  carried directly down to bone at the base of the little toe proximal  phalanx, and the proximal phalanx was resected. Racket incision then carried  longitudinally in line with the plantar ulcer resecting the ulcer and sinus  tract as well as the metatarsal bone and soft tissue surrounding the  cut  ends of the metatarsal bone, back proximally to the base of the right fifth  metatarsal. The base of the fifth metatarsal was then carefully rounded over  the plantar aspect and the distal aspect to prevent further ulcers.  Irrigation was then performed of the right open wound. Tourniquet released  at 17 minutes. Bleeders controlled using electrocautery. Cultures were  obtained, anaerobic and aerobic, sent from the bony ends of the  osteomyelitis site. These were sent for culture and sensitivity as well as  Gram stain. Stat Gram stain returning no organisms seen with rare white  cells, mostly polymorphonucleocytes.  Irrigation was performed.  After  adequate irrigation, then the distal  and most proximal portions of the  incision were approximated with interrupted simple sutures of 4-0 Prolene.  The central third of the open wound was left open and packed with 1/2-inch  Adaptic-like  packing material, normal saline wet-to-dry. Then 4x4's with  Adaptic and  ABD pads applied to the skin and affixed with sterile Webril  and Kerlix. The patient was then re-activated, extubated, and returned to  the recovery room in satisfactory condition. All instrument and  sponge  counts were correct.   SURGICAL PATHOLOGY SPECIMEN:  Right fifth toe along with resected portions  of metatarsal and sinus tract.      JEN/MEDQ  D:  04/20/2005  T:  04/20/2005  Job:  [id number]

## 2011-04-29 NOTE — Op Note (Signed)
NAMERAEQUON, ZETTLER               ACCOUNT NO.:  [id number]   MEDICAL RECORD NO.:  [id number]          PATIENT TYPE:  AMB   LOCATION:  SDS                          FACILITY:  [facility]   PHYSICIAN:  Jessy Oto. Fields, MD  DATE OF BIRTH:  [birth year]   DATE OF PROCEDURE:  09/20/2005  DATE OF DISCHARGE:                                 OPERATIVE REPORT   PROCEDURE:  Right lower extremity arteriogram with ____catheterization,  right common iliac artery.   PREOPERATIVE DIAGNOSIS:  Vein graft stenosis, right leg.   POSTOPERATIVE DIAGNOSIS:  No significant vein graft stenosis.   ANESTHESIA:  Local.   SURGEON:  Charles E. Fields, MD.   ASSISTANT:  Nurse.   OPERATIVE INDICATIONS:  The patient is approximately 3 months status post a  left popliteal-to-posterior-tibial bypass with vein.  He recently presented  on vein graft surveillance to have an increased velocity at the lower third  portion of the graft.  He presents for arteriogram to further define the  stenosis in the vein graft.   OPERATIVE DETAILS:  After obtaining informed consent, the patient was taken  to the [facility] lab.  The patient was placed in supine position on the angio  table.  Next, both groins were prepped and draped in the usual sterile  fashion.  Local anesthesia was infiltrated over the left common femoral  artery.  A Majestic needle was used to cannulate the left common femoral  artery and a 0.035 Wholey wire advanced into the abdominal aorta.  A 5-  French sheath was then placed over the guidewire in the left common femoral  artery.  A 5-French crossover catheter was then advanced over the guidewire  into the abdominal aorta and the right common iliac artery selectively  catheterized using a 0.035 angled Glidewire.  The crossover catheter was  pulled back and replaced with a 5-French end-hole catheter over the  guidewire.  Right lower extremity arteriogram was then obtained.  This shows  a widely patent external iliac  artery, common femoral artery, profunda  femoris artery and superficial femoral artery.  There is a 25% narrowing of  the superficial femoral artery at the adductor hiatus.  Popliteal artery is  widely patent.  The origin of the vein graft is widely patent with no  significant stenosis.  The vein graft is patent throughout its course.  Several views using a peak-hold technique were performed, concentrating just  on the vein graft in the lower leg.  There was one area of irregularity in  the lower third of the leg, but this did not produce a flow-limiting  stenosis.  Multiple views of this area were obtained in an AP and lateral  projection  There was a valve cusp visible in the lower third of the vein  graft, but again, in multiple projections, this did not seem to produce flow-  limiting stenosis.  The distal anastomosis is widely patent.   Next, the 5-French end-hole catheter was removed over a guidewire and the 5-  [place] sheath removed, and hemostasis obtained with direct pressure.  The  patient tolerated the procedure  well and there were no immediate  complications.  The patient was taken to the recovery room in stable  condition.   IMPRESSION:  Patent right lower extremity popliteal-to-posterior-tibial  bypass graft with no areas of flow-limiting stenosis.  Mild irregularity  near valve cusps.           ______________________________  Jessy Oto. Fields, MD     CEF/MEDQ  D:  09/20/2005  T:  09/20/2005  Job:  [id number]

## 2011-04-29 NOTE — Discharge Summary (Signed)
[name], [name]               ACCOUNT NO.:  [id number]   MEDICAL RECORD NO.:  [id number]          PATIENT TYPE:  INP   LOCATION:  5707                         FACILITY:  [facility]   PHYSICIAN:  Verner Chol, MD DATE OF BIRTH:  06/15/1951   DATE OF ADMISSION:  12/29/2004  DATE OF DISCHARGE:                                 DISCHARGE SUMMARY   ADMISSION DIAGNOSES:  1.  Right foot diabetic ulcer.  2.  Fifth metatarsal osteomyelitis.  3.  Diabetes mellitus type 2.  4.  Hypertension.  5.  Hypokalemia.   DISCHARGE MEDICATIONS:  1.  Lisinopril 20 mg one tablet p.o. daily.  2.  Hydrochlorothiazide 25 mg one tablet p.o. daily.  3.  Glucotrol 5 mg - two and a half tablets p.o. b.i.d. 30 minutes before      meals.  4.  Glucophage 500 mg one tablet p.o. b.i.d. to take with meals.  5.  Aspirin 81 mg one tablet p.o. daily.  6.  Tylenol 500 mg one tablet p.o. q.4 to 6h p.r.n. pain.   ADMISSION HISTORY:  The patient dates that three weeks prior to arriving to  the ER he began to notice a raw area on the right side of the ball of his  right foot which was being exacerbated by his shoes.  One week ago the  patient noticed that his right foot was swollen.  Three to four days ago,  the patient foot became sore and he noted some yellow purulent drainage.  The patient applied some antibacterial cream and took aspirin for pain.  The  patient has a history of six years of diabetes mellitus type 2.  The patient  was not taking his Glucophage as he was not able to afford his medications.  The patient has no primary physician.   PHYSICAL EXAMINATION ON ADMISSION:  VITAL SIGNS:  Temperature 98.2, blood  pressure 190/208 and then 114/98, heart rate 76, respiratory rate 20.  GENERAL:  The patient was in no acute distress and oriented times three.  HEENT:  EOMI.  PERRLA.  Funduscopic exam was normal.  CARDIOVASCULAR:  Unremarkable.  PULMONARY EXAM:  Unremarkable.  ABDOMEN:  Obese, soft, non-tender  and non-distended.  No organomegaly or  masses.  Bowel sounds positive.  SKIN:  Dry, darkly pigmented plaques over anterior aspect of lower  extremities bilaterally.  EXTREMITIES:  2+ pitting edema of the right ankle.  Dime sized ulceration on  the sole of the right foot on the fifth metatarsal area.  The ulceration is  covered with a maroon eschar surrounded by yellow/green dried, dead skin.   HOSPITAL COURSE:  1.  RIGHT FOOT DIABETIC ULCER.  The dead tissue surrounding the ulcer area      was eliminated. Positive yellow pustular drainage and malodorous.  The      patient was afebrile.  White blood count was 10.8.  Glucose was 280.      The right foot three view x-ray was requested with negative results.      The patient was admitted for possible osteomyelitis and a right foot MRI  showed cellulitis, myofasciitis and small focus of possible      osteomyelitis involving the fifth metatarsal head.  The patient was      placed on Zosyn for Pseudomonas and gram negative coverage.  On January      19, vancomycin IV was added for gram positive coverage.  Also,      orthopedics were consulted.  Dr. Louanne Skye of orthopedics diagnosed a right      foot diabetic trophic ulcer with minimal osteomyelitis, grade 3 Wegener      classification of ulcer.  Plan by orthopedics included debriding the      ulcer at the bedside, serial dressings with normal saline with wet to      dry, Darco shoe and stage E for ambulation, prevent weight bearing on      the ulcer area, continue IV antibiotics until cellulitis resolved and      the cultures return.  Would culture results were positive cocci in      pairs.  Zosyn was discontinued.  Sensitivity showed MSSA.  The patient      was afebrile throughout the entire hospitalization.  The right foot      edema improved remarkably.  The patient still has some pain on the right      foot ulcer area, intensity 2 out of 10.  Tylenol p.r.n. pain.  By      January 24,  orthopedics was consulted to re-evaluate if the patient      needed a new I&D, and re-evaluate Darco shoe secondary to the patient      being unable to walk with a shoe.  Orthopedics was also consulted for      the number of days to complete the course of antibiotics.  A PICC line      was placed on January 24 with no complications for IV medications at      home.  By January 24 the patient is clinically stable and the right foot      ulcer with much improvement.  In this condition with the PICC line in      place, the patient will be ready for discharge home on January 05, 2005.      We will continue to treat the right fifth metatarsal osteomyelitis with      Levaquin 750 mg IV daily.  Home health is requested for PICC line      management and medication administration through the PICC line.  2.  DIABETES MELLITUS TYPE 2.  The patient has not been compliant with oral      medications secondary to not being able to afford medications.      Hemoglobin A1c was 11.8.  Glucose on the day of admission was 268.  We      placed the patient on sliding scale insulin (moderate) plus Glucophage      500 mg p.o. b.i.d.  Blood sugars were still running high and we added      Glucotrol 12.5 mg p.o. b.i.d.  By January 23, blood sugar was under      control.  CBG in the a.m. was 93.  The patient was not needing sliding      scale insulin (moderate).  Sliding scale insulin was discontinued.  On      January 24, a.m. the patient had a hypoglycemic episode.  Glucotrol was      increased to 15 mg p.o. b.i.d.  and then decreased to Glucotrol to 12.5  mg p.o. b.i.d.  The report included information until January 04, 2005      p.m.  We would consider that the patient can be controlled as an      outpatient with Glucophage 500 mg p.o. b.i.d. and Glucotrol 12.5 mg p.o.      b.i.d.  Since the patient has no family physician, he is being sent home     with an appointment with Dr. Mellody Drown on January 10, 2005 at  2:45 p.m. at      the family practice center at [facility] for follow-up.  If the      patient is stable from the orthopedic clinical standpoint, he will be      discharged home on January 05, 2005 with primary care physician follow-      up appointment for diabetes and osteomyelitis.  3.  HYPERTENSION.  This patient has a history of six years of hypertension.      The patient has not been compliant with blood pressure medications      secondary to economical issues.  The patient was placed on lisinopril      initially at 10 mg p.o. daily, then increased to 20 mg p.o. daily.  The      patient was also placed on hydrochlorothiazide 25 mg p.o. daily.  Blood      pressure has been stable throughout the remainder of the      hospitalization.  By January 24, blood pressure was mildly elevated at      144/93.  We considered that these medications are appropriate to treat      this patient's blood pressure as an outpatient.  The patient has a      follow-up appointment with his primary care physician on January 10, 2005 at [facility] family practice center.  4.  HYPOKALEMIA.  On the day of admission the patient presented with a      potassium level of 3.6 and then it went down to 3.3 on January 21.      Potassium p.o. was started.  Potassium levels by January 24 were 3.5.      Hypokalemia resolved.   FOLLOW UP:  Follow-up with Dr. Mellody Drown on January 10, 2005 at 2:45 p.m. at  the family practice center at [facility].   PROCEDURES:  1.  Right foot MRI.  2.  Chest x-ray.  3.  PICC line placement.  4.  I&D of the right foot ulcer.  5.  Right foot three view x-ray.   CONSULTATIONS:  1.  Jessy Oto, M.D. of orthopedics.  2.  Physical therapy.  3.  Home health.  4.  Case management.      FIM/MEDQ  D:  01/04/2005  T:  01/04/2005  Job:  [id number]

## 2011-04-29 NOTE — Discharge Summary (Signed)
[name], [name]               ACCOUNT NO.:  [id number]   MEDICAL RECORD NO.:  [id number]          PATIENT TYPE:  INP   LOCATION:  2034                         FACILITY:  [facility]   PHYSICIAN:  Jessy Oto. Fields, MD  DATE OF BIRTH:  [birth year]   DATE OF ADMISSION:  05/26/2005  DATE OF DISCHARGE:                                 DISCHARGE SUMMARY   ADMISSION DIAGNOSES:  Nonhealing ulcer of right foot with severe tibial  atherosclerotic occlusive disease.   PAST MEDICAL HISTORY AND DISCHARGE DIAGNOSES:  1.  Hypertension.  2.  Type 2 diabetes mellitus.  3.  Peripheral neuropathy.  4.  Remote tobacco abuse.  5.  History of deep venous thrombosis in right lower extremity secondary to      trauma approximately 1980.  6.  Arthritis.  7.  Childhood asthma.  8.  Umbilical herniorrhaphy.  9.  Osteectomy right fifth toe.  10. Nonhealing ulcer right foot with history fifth toe osteomyelitis, status      post right popliteal to posterior tibial bypass with reverse greater      saphenous vein.  11. Postoperative mild cellulitis treated with Keflex.   ALLERGIES:  No known drug allergies.  He does have an Nogal.   BRIEF HISTORY:  The patient is a 60 year old African-American male with  history of right fifth toe ulceration that began in January 2006.  He  initially underwent conservative management with local wound care by Dr.  Louanne Skye, however, the wound progressed to an osteomyelitis and required an  osteectomy of the fifth toe.  This was performed in May of 2006.  He  continued to show difficulty in healing his wounds and was subsequently seen  by Dr. Nils Pyle at the [facility].  Ankle brachial indices were  performed there and at the [facility] office and revealed a 0.71 index on the  right and greater than 1.0 on the left.  He was also seen in consultation by  Dr. Ruta Hinds.  The patient then underwent arteriography in June of  2006 and findings included severe  right tibial artery occlusive disease with  posterior tibial artery run off to the foot and incomplete filling of the  plantar arch.  The patient was felt to be an appropriate candidate for  surgical revascularization and therefore he was admitted and prepped for  this procedure.   HOSPITAL COURSE:  The patient was admitted and taken to the operating room  on May 26, 2005 for a right popliteal to posterior tibial bypass with  reverse saphenous vein.  The patient tolerated the procedure well and was  hemodynamically stable immediately postoperatively.  The patient was  extubated without complications and woke up from anesthesia neurologically  intact. The patient was transferred from the operating room to the post  anesthesia care unit in stable condition.   The patient's postoperative course progressed as expected.  Wet to dry  dressing changes were continued on the right foot throughout the  postoperative course.  He began physical therapy and has increased his  tolerance to a satisfactory level.  He has  met his physical therapy goals at  this time and outpatient physical therapy will not be necessary.  He is able  to ambulate independently and use specialty shoes that he already had.   Throughout the postoperative course the patient has maintained a strong  Doppler signal in the posterior tibial and dorsalis pedis pulses on the  right lower extremity.  There has also been a strong graft pulse present.  Postoperative ankle brachial indices were performed on May 27, 2005 and  revealed 0.86 on right and greater than 1.0 on the left.  On postoperative  day #4 the patient was noted to have an elevated temperature at 100.4.  His  white count was also noted to be elevated at 15.2.  The right lower  extremity incisions were mildly erythematous and therefore he was started on  Keflex for possible cellulitis.  On postoperative day #5 the patient  continued to have a slightly elevated  temperature of 100.4.  His other vital  signs remained stable.  The incisions were mildly erythematous, dry and  intact.  The foot was warm with a palpable graft pulse and Doppler signal  present in the dorsalis pedis pulse on the right.  Lateral foot wound is  granulating well.  There has been some improvement in the appearance of the  right lower extremity incisions after initiation of Keflex treatment.  The  patient is instructed to continue his ambulation and as long as he continues  to progress in the current manner should be ready for discharge in the next  one to two days pending morning rounds re-evaluation.   LABORATORY DATA:  CBC on May 30, 2005 white count 15.2, hemoglobin 10.1,  hematocrit 29.2, platelet count 283,000.  BMP on May 27, 2005 sodium was  133, potassium 4.0, BUN 13, creatinine 1.3, glucose 124.   CONDITION ON DISCHARGE:  Improved.   DISCHARGE INSTRUCTIONS:   DISCHARGE MEDICATIONS:  1.  Glucophage 1 gram b.i.d.  2.  Glipizide 10 mg daily.  3.  Hydrochlorothiazide 25 mg daily.  4.  Enalapril 5 mg daily.  5.  Aspirin 325 mg p.o. daily.  6.  Keflex 500 mg t.i.d. for 8 additional days.  7.  Tylox one to two q.4-6h. PRN pain.   ACTIVITY:  No driving and patient should continue daily walking exercises.   DIET:  Low salt, low fat, diabetic modified.   WOUND CARE:  The patient should clean the incisions daily with soap and  water.  If wound problems arise the patient should contact the [facility] office  at [phone].   FOLLOW UP:  Follow up appointment with [facility] R.N. for staple removal.  [facility]  office will contact the patient with date and time of that appointment.  Follow up with Dr. Oneida Alar three weeks after discharge with ankle brachial  indices to be performed at the [facility] office.  Again, [facility] office will contact  the patient with the date and time of that appointment.      [place]   AY/MEDQ  D:  05/31/2005  T:  05/31/2005  Job:  [id number]  cc:   Jessy Oto,  M.D.  [place]  [place] [address]  Fax: [phone]   Dr. Nolen Mu. [facility]

## 2011-04-29 NOTE — Op Note (Signed)
[name], [name]               ACCOUNT NO.:  [id number]   MEDICAL RECORD NO.:  [id number]          PATIENT TYPE:  REC   LOCATION:  FOOT                         [facility]ACILITY:  [facility]   PHYSICIAN:  Jessy Oto, M.D.   DATE OF BIRTH:  1951-11-18   DATE OF PROCEDURE:  03/09/2005  DATE OF DISCHARGE:                                 OPERATIVE REPORT   PREOPERATIVE DIAGNOSIS:  Right foot grade III neurotrophic ulcer underlying  the right fifth metatarsal phalangeal joint with osteomyelitis changes that  are worsening radiographically.   POSTOPERATIVE DIAGNOSIS:  Right foot grade III neurotrophic ulcer underlying  the right fifth metatarsal phalangeal joint with osteomyelitis changes that  are worsening radiographically.   PROCEDURE:  Incision, irrigation, debridement of right fifth metatarsal  phalangeal joint and neurotrophic ulcer underlying the right fifth  metatarsal phalangeal joint.   SURGEON:  Jessy Oto, M.D.   ASSISTANT:  [name], [facility].   ANESTHESIA:  GOT Sharolyn Douglas, M.D.   ESTIMATED BLOOD LOSS:  10 mL.   DRAINS:  The incision was packed with 1/2-inch Nugauze with double  antibiotic solution wet-to-dry.   SPECIMEN:  Portion of bone removed sent for culture and sensitivity.   HISTORY OF PRESENT ILLNESS:  This patient is a 60 year old male with a 1-1/2  month history of right foot neurotrophic ulcer and newly diagnosed diabetes  mellitus. He has been placed on oral antihypoglycemic agents and is on  antibiotic treatment, underwent initial 6-week treatment with IV antibiotics  now on the oral Avalon. The patient has undergone preoperative studies that  demonstrated an increase lytic change in the base of the right fifth  proximal phalanx and involving the distal portions of the right fifth  metatarsal at the metatarsal phalangeal joint. Debridement on several  occasions down to the level of the joint and has entered into the joint line  indicating the  problem of progressive osteomyelitis. The patient is brought  to the operating room to undergo formal debridement and excision of  osteomyelitis segments in the right fifth metatarsal phalangeal joint.   INTRAOPERATIVE FINDINGS:  As above.   DESCRIPTION OF PROCEDURE:  After adequate general anesthesia the right lower  extremity prepped with Betadine scrub and prep solution and draped in the  usual manner. Esmarch bandage used to apply tourniquet to the right midfoot  and a glove over the end of the foot as he is scaling quite significantly.  Incision in the midline overlying the fifth metatarsal phalangeal joint  plantar surface. This incision extended distally approximately 1.5 cm and  proximally 2 to 2.5 cm. Through skin and subcutaneous layers deeply down to  bone in the midline proximal phalanx and more proximally at the metatarsal  neck and shaft level. With this then using loupe magnification and good  lighting, the distal portions of the metatarsal were then resected. They  were found to be quite soft and purulent in appearance and this was resected  back to normal-appearing metatarsal shaft bone. As well as the proximal  portion of the proximal phalanx resected back to the midportion of the  proximal phalanx shaft. Irrigation was then performed of the open wound  debridement carried out over devitalized tissue present. Overall the toe  remained viable. The Esmarch bandage was released. Irrigation with both  saline and then double antibiotic solution was performed. Bleeders were  controlled using electrocautery. Bleeding was controlled and the areas that  were used for opening the incision were then closed with interrupted 3-0  Prolene suture. Then the central open wound then packed with 1/2 inch  Nugauze with double antibiotic solution. The patient then had dressing with  4x4s, ABD pad, Kerlix and Coban and he will be discharged to undergo  dressing changes at home.   SPECIMEN:   Portion of bone removed from the distal metatarsal was sent for  culture and sensitivity.   POSTOPERATIVE CARE:  The patient will be seen back in the office in 2 days  to undergo dressing changes so he will continue dressing changes at home and  on antibiotics of Augmentin 500 milligrams q.8 hours. Be seen back in the  office as said in 2 days. Practice healing sandal.      JEN/MEDQ  D:  03/09/2005  T:  03/10/2005  Job:  [id number]

## 2011-04-29 NOTE — Consult Note (Signed)
[name], [name]               ACCOUNT NO.:  [id number]   MEDICAL RECORD NO.:  [id number]          PATIENT TYPE:  INP   LOCATION:  5707                         FACILITY:  [facility]   PHYSICIAN:  Jessy Oto, M.D.   DATE OF BIRTH:  1951/07/07   DATE OF CONSULTATION:  DATE OF DISCHARGE:  12/31/2004                                   CONSULTATION   CHIEF COMPLAINT:  Osteomyelitis underlying right fifth metatarsal head.   HISTORY OF PRESENT ILLNESS:  A 60 year old male with a three-week history of  sore developing beneath the right foot, fifth digit, related to prolonged  standing at his job place.  Over the last one week, he has noted worsening  swelling and drainage from the sore underlying the fifth metatarsophalangeal  joint.  Seen in the emergency room two days ago on December 29, 2004 and  admitted with x-ray negative for osteomyelitis and cellulitis changes  extending up the right leg.  He was placed on IV antibiotics and Zosyn.  Most recently switched to vancomycin yesterday and yesterday underwent a MRI  scan which demonstrated a small focus of osteomyelitis involving the right  fifth metatarsal head.   Orthopedic consultation was obtained.  The patient does have a history of  type 2 diabetes using oral agents for the last seven years.  He is quite  large framed.  Pulses in the right foot are palpable in the DP and posterior  tibia.  His ankle-arm ratios are 0.89 for the right foot.  Certainly good  enough to allow for healing.  Sensory is intact in the right foot.  There is  a 2 cm diameter ulcer, eschar underlying the right fifth metatarsophalangeal  joint on the latter aspect.   X-rays of the right foot demonstrate soft tissue swelling over the lateral  plantar aspect of the right foot with possible skin defect over the plantar  lateral aspect of the foot.  MRI scan from December 30, 2004 indicates  cellulitis, edema and changes within the soft tissue underlying the right  fifth metatarsophalangeal joint.  There is a signal on the plantar fifth  metatarsal head consistent with osteomyelitis.   Today, I went ahead and debrided the ulcer sharply under prep with the  Betadine solution without local anesthetic using a 15 blade scalpel and  forceps.  I went down to more viable tissue.  Culture was obtained for  culture and sensitivity and Gram-stain in the deep ulcer region.   IMPRESSION:  Right foot diabetic neurotrophic ulcer with minimal  osteomyelitis.  Grade III Wegener classification of ulcer.   PLAN:  Debrided the ulcer at bedside.  We will check cultures.  Serial  dressings with normal saline wet-to-dry.  Darco shoe for ambulation.  Prevent weightbearing on this area.  Continue IV antibiotics until  cellulitis resolve and until cultures return.  Allow for changing over to  p.o. antibiotics when possible.     JEN/MEDQ  D:  12/31/2004  T:  01/01/2005  Job:  [id number]

## 2011-04-29 NOTE — Consult Note (Signed)
[place]  Patient:    [name], [name] Visit Number: [id number] MRN: [id number]          Service Type: EMS Location: [facility] Attending Physician:  Lajean Saver Dictated by:   Anabel Bene Constance Holster, M.D. Proc. Date: 06/01/02 Admit Date:  06/01/2002 Discharge Date: 06/01/2002                            Consultation Report  TIME OF CONSULTATION:  9 a.m.  REASON FOR CONSULTATION:  Possible peritonsillar abscess.  HISTORY:  This is a 60 year old gentleman who began having severe sore throat about one week prior.  He was started on penicillin several days prior to my evaluation today.  His symptoms were worsening and he returned to the emergency department late last night.  He has a history of diabetes and hypertension; he is controlled with medication.  He does not have a primary care physician.  He does not have a history of chronic tonsillitis or previous peritonsillar abscess.  PHYSICAL EXAMINATION:  GENERAL:  He is a generally healthy-appearing gentleman.  There is no respiratory distress.  HEENT/NECK:  There is minimal trismus.  There is slight tender adenopathy of the left upper anterior jugular chain.  Ears are clear to inspection.  No evidence of infection or fluid.  The nasal exam was clear.  Oral cavity and pharynx reveal significant palatal edema on the left side with slight displacement of the tonsil.  The tonsils are quite large in general.  The uvula and free edge of the palate are thickened and redundant.  IMPRESSION:  Possible peritonsillar abscess.  RECOMMENDATION:  Recommend attempt at incision and drainage.  PROCEDURE NOTE:  Xylocaine 2% with epinephrine was infiltrated into the soft palate and the anterior fossal arch on the left side.  An 18-gauge needle with a 5-cc syringe was used to aspirate in multiple areas of the peritonsillar space.  There was no purulence obtained.  There was minimal bleeding encountered.  He  tolerated this well.  IMPRESSION:  He has severe tonsillitis with peritonsillar cellulitis. Recommend change the antibiotic to Augmentin 875 mg b.i.d., a prescription was given.  He is instructed to follow up with me in one to two days if his symptoms worsen at all.  I explained to him that even though there was no abscess present today, there may be one forming and it could have to be drained in one or two days.  He understands all this and will be discharged to home now, instructed to drink lots of fluids and use over-the-counter analgesics as needed. Dictated by:   Anabel Bene Constance Holster, M.D. Attending Physician:  Lajean Saver DD:  06/01/02 TD:  06/03/02 Job: [id number]

## 2011-04-29 NOTE — H&P (Signed)
[name], [name]               ACCOUNT NO.:  [id number]   MEDICAL RECORD NO.:  [id number]          PATIENT TYPE:  INP   LOCATION:  5707                         FACILITY:  [facility]   PHYSICIAN:  Verner Chol, MD DATE OF BIRTH:  12/11/1951   DATE OF ADMISSION:  12/29/2004  DATE OF DISCHARGE:                                HISTORY & PHYSICAL   HISTORY OF PRESENT ILLNESS:  A 60 year old, African-American male with a  three-week history of right foot ulcer.  The patient stated that three weeks  prior to the visit to the ER, he noted an area on the right side of the ball  of his right foot, which was being exacerbated by his shoes.  One week ago,  the patient noted that his right foot was swollen.  Three to four days prior  to the ED visit, the patient had cleaned the sore and noticed some yellow  purulent drainage.  The patient applied some antibacterial cream and took  Advil for the pain.   PAST MEDICAL HISTORY:  1.  Diabetes mellitus type 2 (diagnosed six years ago).  The patient was      prescribed Glucophage. But, due to the lack of financial resources,      patient has not been taking this medication for at least two years.  2.  Hypertension (diagnosed six years ago).  The patient not currently      taking medications due to the lack of financial resources.   PAST SURGICAL HISTORY:  Umbilical hernia repair (now recurred).   ALLERGIES:  NKDA.   FAMILY HISTORY:  Son passed away in Mar 07, 2001.  Diabetes (siblings and mother).  Mother passed away in 03-07-93 secondary to surgical complications.   SOCIAL HISTORY:  The patient work hours recently reduced to 12 hours from 80  hours.  The patient is under financial stress.  He works at [employer] and  is currently looking for a new job.  He has moved with his brother and his  brother's girlfriend.  He spends much of his free time with his 42 year old  aunt.  The patient stopped smoking 25 years ago (13 year history of smoking  1-2 packs  a day).  Occasional social ETOH use.  No illegal drugs.   REVIEW OF SYSTEMS:  GENERAL:  No fever, no chills, no lethargy.  HEENT:  Fluctuating blurry vision and worsening night vision.  No changes in visual  fields.  Scotoma, no changes in hearing, no headache, no mouth ulcers.  CARDIOVASCULAR:  No CP, no palpitations.  PULMONARY:  No SOB, no cough, no  orthopnea.  ABDOMEN:  No pain, no nausea, vomiting, constipation, or  diarrhea.  EXTREMITIES:  Right foot swelling.  Dime-sized right foot ulcer  on the fifth metatarsal area.   PHYSICAL EXAMINATION:  VITAL SIGNS:  Temperature 98.2, pulse 76, respiratory  rate 20, blood pressure systolic 99991111 to [id number], diastolic blood pressure  123456.  GENERAL:  Middle-aged, African-American male in no acute distress lying in  ER bed, oriented x 3.  HEENT:  EOMI, PERRLA.  Funduscopic exam:  No  papilledema, no hemorrhage, no  exudates.  NECK:  Supple, no masses.  Nose and throat unremarkable.  Ears within normal  limits.  CARDIOVASCULAR:  Regular rate and rhythm, normal S1, S2, no murmurs.  PULMONARY:  Clear to auscultation bilaterally.  ABDOMEN:  Obese, soft, nontender, nondistended, bowel sounds positive.  No  organomegaly.  Umbilical hernia reducible.  SKIN:  Dry, badly pigmented plaques over anterior aspect of lower  extremities bilaterally.  EXTREMITIES:  2+ pitting edema on the right ankle and foot.  Dime-sized  ulceration on the right sole on the fifth metatarsal area surrounded by  yellow-green dry, dead skin.  NEUROLOGIC:  Cranial nerves II-XII intact.  No witness, no sensory deficit.   LABORATORY DATA:  WBC 10.8, hemoglobin 13.6, hematocrit 39.5, platelets 251.  MCV 80.  BMP:  Sodium 136, potassium 4.1, chloride 104, PCO2 29, BUN 14,  creatinine 0.9, glucose 262 to 289.  Three-view right foot x-ray pending.   ASSESSMENT AND PLAN:  A 60 year old African-American male with right foot  diabetic ulcer, hypertension, diabetes.  1. Foot diabetic  ulcer:  Ulcer was  debrided, some yellow secretions expressed.  Fifth metatarsal head was  palpable.  Will cover with Zosyn 3.375 grains IV q.6h.  Right foot x-ray was  normal.  2. Diabetes mellitus, type 2:  CBG 262.  Will place an SSI  (moderate) and Glucophage 500 mg p.o. b.i.d.  Will follow CBGs.  3.  Hypertension 190/92:  Will treat with lisinopril 10 mg p.o. daily and HCTZ  25 mg p.o. daily.  EKG - no significant changes.  4. Fluids, electrolytes,  and nutrition/gastrointestinal:  Carbohydrate modifying diet, restrict  sodium to 2 grains a day.  KVO IV fluids.  5. Disposition.  Admit to  __________.  Tylenol p.r.n. pain.      FIM/MEDQ  D:  01/04/2005  T:  01/04/2005  Job:  [id number]

## 2011-04-29 NOTE — Op Note (Signed)
[name], [name]               ACCOUNT NO.:  [id number]   MEDICAL RECORD NO.:  [id number]          PATIENT TYPE:  INP   LOCATION:  39                         FACILITY:  [facility]   PHYSICIAN:  Jessy Oto. Fields, MD  DATE OF BIRTH:  12/24/1950   DATE OF PROCEDURE:  05/26/2005  DATE OF DISCHARGE:                                 OPERATIVE REPORT   PROCEDURE:  Right popliteal to posterior tibial bypass with reversed greater  saphenous vein.   PREOPERATIVE DIAGNOSIS:  Nonhealing wound of right foot.   POSTOPERATIVE DIAGNOSIS:  Nonhealing wound of right foot.   ANESTHESIA:  General.   SURGEON:  Charles E. Fields, M.D.   ASSISTANTS:  Suzzanne Cloud, P.A., and Judeth Cornfield. Scot Dock, M.D.   INDICATIONS:  The patient is a 60 year old male with diabetes and severe  tibial atherosclerotic occlusive disease. He has had a previous fifth toe  amputation which is not healing. He requires revascularization for improved  for circulation to the foot.   OPERATIVE FINDINGS:  1.  Good-quality saphenous vein approximately 4 mm in diameter.  2.  Posterior tibial artery 2 mm in diameter.   OPERATIVE DETAILS:  After obtaining informed consent, the patient was taken  to the operating room. The patient was placed in the supine position on the  operating table. After induction of general anesthesia and endotracheal  intubation, a Foley catheter was placed. Next, both lower extremity prepped  and draped in the usual sterile fashion. A longitudinal incision was then  made on the medial aspect of the right leg just posterior to the medial  malleolus over the area of the most intense Doppler signal over the  posterior tibial artery. Incision was carried down through the subcutaneous  tissues and the fascia was opened and the posterior tibial artery exposed.  It was quite small in diameter, but not heavily calcified. Next, an incision  was made just below the knee and posterior to the tibia edge. The  incision  was carried down through the subcutaneous tissues. The greater saphenous  vein was dissected free. It was of good quality proximally 4 mm in diameter.  The greater saphenous vein was then harvested to just above the ankle and  just above the knee. Side branches were ligated and divided between silk  ties. Next, the incision below the knee was deepened down to the level of  the popliteal artery. This was dissected free circumferentially and  controlled with vessel loop. Next, the saphenous vein was transected  proximally in the thigh and the distal stump suture ligated with a 3-0 silk  tie. The vein was then harvested all the way down to the level of the ankle  and suture ligated distally and transected. The vein was then reversed and  flushed thoroughly with heparinized saline. Side branches were inspected and  there found to be no leaks. Next, the patient was given 5000 units of  intravenous heparin. The popliteal artery was then clamped proximally with a  Henley clamp and controlled distally with a vessel loop. Longitudinal  arteriotomy was made. The the vein was reversed and  beveled and sewn end of  vein to side of artery using a running 6-0 Prolene suture. Just prior to  completion of the anastomosis, this was forebled, backbled and thoroughly  flushed. Flow was then restored to the popliteal artery and there was good  flow through the graft. There was one small side branch that was repaired  with a 7-0 Prolene suture. Next, the graft was marked for orientation. A  subcutaneous tunnel was created connecting the below-knee incision down to  the level of the posterior tibial incision with the ankle. The graft was  tunneled just posterior to the vein harvest incision. Next, the graft was  clamped proximally and a tourniquet was placed on the calf just below the  knee. The foot was exsanguinated with an Esmarch bandage. The tourniquet was  then inflated to 300 mmHg. The vein was  then cut to length. A longitudinal  arteriotomy was made in the posterior tibial artery. The vein was spatulated  and sewn end of vein to side of artery using running a 6-0 Prolene suture.  The artery was 2 mm internal diameter assessed by coronary dilator. Just  prior completion of anastomosis, the was forebled, backbled and thoroughly  flushed and the tourniquet was deflated. The anastomosis was secure. There  was good Doppler flow within the graft and within the posterior tibial  artery at this time. Next, a completion arteriogram was obtained by  introducing a 21 gauge butterfly needle into the proximal graft. This was  done with inflow occlusion. Arteriogram returned and showed good antegrade  and retrograde filling of the posterior tibial artery, however, the  anastomosis did not fill well.  Therefore, the tourniquet was reinflated below the knee. The foot was again  exsanguinated with an Esmarch. The anastomosis was reopened. There was no  evidence of thrombus, dissection or narrowing of the anastomosis. The entire  medial wall was taken down and then the anastomosis was redone using a  running 7-0 Prolene suture. Just prior completion of anastomosis, this was  forebled, backbled and thoroughly flushed. The anastomosis was secure.  Clamps were released and the tourniquet was deflated. An additional  completion arteriogram was obtained in similar fashion to the first one with  inflow occlusion. This showed a widely patent distal anastomosis and filling  of the posterior tibial artery. At this point hemostasis was obtained. The  butterfly needle was removed and repaired with a 7-0 Prolene suture. Next,  the subcutaneous tissues of the thigh and leg incision were approximated  with running 2-0 Vicryl sutures. The skin of those incisions was then closed  with staples. The skin incision over the posterior tibial artery was closed with interrupted 3-0 vertical mattress nylon sutures. The  patient tolerated  the procedure well and there were no complications. There was a good Doppler  signal in the dorsalis pedis and posterior tibial distal to the anastomosis.  There was a good graft Doppler signal and palpable graft pulse. The patient  was taken to the recovery room in stable condition.       CEF/MEDQ  D:  05/26/2005  T:  05/26/2005  Job:  [id number]

## 2011-04-29 NOTE — H&P (Signed)
NAMEBAXTER, GOETTL               ACCOUNT NO.:  [id number]   MEDICAL RECORD NO.:  [id number]          PATIENT TYPE:  INP   LOCATION:  NA                           FACILITY:  [facility]   PHYSICIAN:  Jessy Oto. Fields, MD  DATE OF BIRTH:  1951/01/15   DATE OF ADMISSION:  05/26/2005  DATE OF DISCHARGE:                                HISTORY & PHYSICAL   CHIEF COMPLAINT:  Nonhealing ulcer of the right foot with history of fifth  toe osteomyelitis.   HISTORY OF PRESENT ILLNESS:  This is a 60 year old black male with a history  of right fifth toe ulceration on or about January 2006.  He initially  underwent conservative management with local wound care by Dr. Louanne Skye.  The  wound progressed to an osteomyelitis and required and osteoectomy of the  fifth toe.  This was done in May 2006.  He has shown continuous difficulty  in healing his wounds and subsequently was seen by Dr. Zada Girt at the  diabetic foot center.  Ankle brachial studies were then obtained and  revealed some impaired flow on the right side.  Study done at [place] office on  May 13, 2005, showed a 0.71 index on the right and greater than 1.0 on the  left.  He was seen in consultation by Dr. Ruta Hinds.  The patient was  felt to be a candidate for further evaluation by arteriography, and this was  done on May 17, 2005.  Findings included 1) patent iliac and femoral  arterial system.  2) Severe tibial artery occlusive disease with posterior  tibial artery runoff to the foot and incomplete filling of the plantar arch.  These results were all pertaining to the right lower extremity.  The patient  is felt to be a candidate for surgical revascularization; specifically, he  is scheduled for venous mapping on today's date and right popliteal-to-  posterior-tibial-artery bypass on May 26, 2005, by Dr. Oneida Alar.  The patient  admits to foot pain but no frank claudication or rest pain.  He has some  tenderness and also neuropathic  symptoms in the lower extremity.  He will be  admitted this hospitalization for the procedure.   PAST MEDICAL HISTORY:  1.  Hypertension.  2.  Diabetes mellitus, type 2.  3.  Peripheral neuropathy.  4.  Remote tobacco use.  5.  History of DVT in the right leg secondary to trauma approximately 1980.  6.  Arthritis.  7.  Childhood asthma.   PAST SURGICAL HISTORY:  1.  Umbilical herniorrhaphy.  2.  Osteoectomy, right fifth toe.   ALLERGIES:  No known drug allergies.  He does have intolerance to [facility].   CURRENT MEDICATIONS:  1.  Glucophage 1 g b.i.d.  2.  Motrin 600 mg 1 tablet 3 times daily.  3.  Glipizide 10 mg b.i.d.  4.  Hydrochlorothiazide 25 mg daily.  5.  Enalapril 5 mg daily.  6.  Aspirin 325 mg daily.   REVIEW OF SYMPTOMS:  See History of Present Illness for pertinent positives  and negatives.  Otherwise noncontributory full system review.  SOCIAL HISTORY:  Single with no children.  He quit tobacco in 1980.  Approximate use was 17 years.  Alcohol use: None.  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   FAMILY HISTORY:  Remarkable for diabetes and myocardial infarction.  Both  his mother and brother had diabetes.   PHYSICAL EXAMINATION:  VITAL SIGNS:  Blood pressure 140/90 on the left.  Heart rate 80, respirations 12.  GENERAL:  This is a 60 year old black male in no acute distress.  He does  walk with crutches.  This is to keep pressure off the right foot.  HEENT:  Normocephalic and atraumatic.  Pupils equal, round, and reactive to  light.  Extraocular movements intact.  Oral mucosa pink.  He has some poor  dentition.  Sclerae is anicteric but muddy.  NECK:  Supple.  He has palpable carotid arteries, no bruits.  No  lymphadenopathy, no thyromegaly.  PULMONARY:  Examination symmetrical on inspiration, unlabored.  Clear breath  sounds.  CARDIAC:  Slightly irregular.  There is a 1 to 2/6 systolic mitral murmur  with an S3 gallop.   ABDOMEN:  Soft, nontender, nondistended.  Normoactive bowel sounds.  No  masses, no hepatosplenomegaly.  He is obese.  GU/RECTAL:  Deferred.  EXTREMITIES:  Ulceration on the left lateral aspect of the foot,  approximately 5 x 2 cm.  Some necrosis to the skin edges.  No cellulitis, no  frank purulence.  There is minimal edema over the right lower extremity.  NEUROLOGIC:  Examination is nonfocal.  Alert and oriented x 4.  He walks  with crutches, but muscle strength appears grossly within normal limits.  Deep tendon reflexes are 1+.   ASSESSMENT:  Right diabetic foot with severe peripheral vascular arterial  occlusive disease.   PLAN:  Revascularization as described on May 26, 2005, per Dr. Ruta Hinds.      Wayne   WEG/MEDQ  D:  05/25/2005  T:  05/25/2005  Job:  [id number]   cc:   Thayer Headings, M.D.  [address] [place] [address]  Fax: [phone]   Jessy Oto, M.D.  [place]  [place] [address]  Fax: [phone]   Dr. Moreira, [place]

## 2011-04-29 NOTE — Consult Note (Signed)
NAMEJAYMZ, [name]               ACCOUNT NO.:  [id number]   MEDICAL RECORD NO.:  [id number]           PATIENT TYPE:   LOCATION:                                 FACILITY:   PHYSICIAN:  Epifania Gore. Nils Pyle, M.D.     DATE OF BIRTH:   DATE OF CONSULTATION:  04/14/2005  DATE OF DISCHARGE:                                   CONSULTATION   REASON FOR CONSULTATION:  This 60 year old man is referred by Dr. Paulita Cradle for  evaluation of a chronically draining wound on the fifth metatarsal head of  the right foot.   IMPRESSION:  Probable chronic osteomyelitis with a Wagner's 3 diabetic  ulcer.   RECOMMENDATION:  Arterial screening with segmental arterial Dopplers and  proceed with an arteriogram if these are abnormal. In the interim, continue  the packing to avoid abscess formation. Continue his amoxicillin as per Dr.  Mellody Drown at the [facility].   SUBJECTIVE:  The patient is a 60 year old diabetic who has had an infected  foot for the past several months. He has been managed by Dr. Louanne Skye with an  ostectomy and open drainage. In spite of his surgical procedure, he has  continued to have drainage. He has had a course of parenteral antibiotics  which were guided by cultures. He is referred because of the refractory  nature.   PAST MEDICAL HISTORY:  Remarkable for his diabetes. He is also hypertensive  and has had a history of a blood clot in his right leg in 1980. He was  treated with heparin but has not continued on Coumadin. Surgery has included  a herniorrhaphy and the aforementioned ostectomy. He denies allergies.   CURRENT MEDICATIONS:  1.  Aspirin 81 mg a day.  2.  Hydrochlorothiazide 25 mg a day.  3.  Enalapril 22 mg a day.  4.  Glyburide 25 mg b.i.d.  5.  Metformin 600 b.i.d.  6.  Amoxicillin 500 mg b.i.d.  7.  Ibuprofen 600 mg p.r.n.   FAMILY HISTORY:  Positive for diabetes, MI. Negative for stroke, negative  for renal failure.   SOCIAL HISTORY:  He is  divorced. He is unemployed. He lives with his  brother. His brother has been doing his packing.   He specifically denies cardiorespiratory symptoms. The remainder of the  review of systems is negative.   PHYSICAL EXAMINATION:  GENERAL:  He is an alert, oriented man in no acute  distress.  HEENT:  Clear.  NECK:  Supple, trachea is midline, thyroid is not palpable.  LUNGS:  Clear.  HEART SOUNDS:  Normal.  ABDOMEN:  Soft.  EXTREMITIES:  Abnormal. There is 1+ edema on the right lower extremity.  There is a draining sinus at the head of the fifth metatarsal of the right  foot. A Q-Tip was used and this probes down to 4.5 cm and there is a  sensation of it impacting upon bone. The sinus cavity itself is very friable  with easily bleeding. There is no frank purulence. There is no malodor. His  pedal pulse on the right side is  indeterminate and a concurrent Doppler exam  shows an absence of a dorsalis pedis pulse and a very harsh monophasic  posterior tibial pulsation. The capillary refill is indeterminate. In the  left lower extremity, there is a definite dorsalis pedis +2. The Doppler  shows a polyphasic signal. The edema is symmetrical, 1+ on the left.  NEUROLOGIC:  There is decreased sensation in the lower extremity consistent  with a diabetic neuropathy. There is no prominent adenopathy.   DISCUSSION:  On physical exam the patient has findings consistent with an  osteomyelitis of a chronic duration. This osteomyelitis is present on  physical exam despite his previous ostectomy. With regard to his vascular  status, the physical exam as well as pencil Doppler suggest a significant  obstruction of flow on the left lower extremity which may be a contributing  factor to his recalcitrant osteomyelitis. We have explained these findings  and this assessment to the patient in terms that he seems to understand. We  have recommended that we proceed with segmental arterial Dopplers and we  have in  anticipation of these studies being abnormal, we have scheduled him  for an arteriogram. If at the time of the arteriogram it is felt that an  intervention can be performed, I would recommend to proceed on with that,  and if this is not the case we will see him back for a formulation of a more  aggressive treatment plan which may include hyperbaric oxygen therapy and/or  re-referral for a repeat ostectomy.       ___________________________________________  Epifania Gore Nils Pyle, M.D.    Rondel Oh  D:  04/14/2005  T:  04/14/2005  Job:  [id number]   cc:   Jessy Oto, M.D.  [place]  [place] [address]  Fax: [phone]   Lauraine Rinne, MD  Fax: [phone]

## 2011-04-29 NOTE — Op Note (Signed)
[name], [name]               ACCOUNT NO.:  [id number]   MEDICAL RECORD NO.:  [id number]          PATIENT TYPE:  OIB   LOCATION:  2899                         FACILITY:  [facility]   PHYSICIAN:  Jessy Oto. Fields, MD  DATE OF BIRTH:  [birth year]   DATE OF PROCEDURE:  05/17/2005  DATE OF DISCHARGE:                                 OPERATIVE REPORT   PROCEDURE:  Right lower extremity arteriogram.   PREOPERATIVE DIAGNOSIS:  Nonhealing wound right foot.   POSTOPERATIVE DIAGNOSIS:  Nonhealing wound right foot.   ANESTHESIA:  Local.   ASSISTANT:  Nurse.   INDICATIONS:  The patient has a history of nonhealing wound on the right  lateral aspect of his foot. He has previously undergone a right fifth toe  amputation and the wound has not healed.  He has abnormal noninvasive  arterial duplex studies.   OPERATIVE DETAILS:  After obtaining informed consent, the patient came to  the [facility] lab. The patient's right groin was prepped and draped in the usual  sterile fashion. Local anesthesia was infiltrated over the right common  femoral artery. Next, a Majestic needle was used to cannulate the right  common femoral artery.  A 0.35 J-tipped guidewire was then threaded in the  right common femoral artery and a 5-French sheath placed over this. Next,  views of the right iliac and right lower extremity arterial system were  obtained. The right common iliac artery is patent throughout its course with  no significant stenosis. The internal iliac and external iliac arteries are  widely patent. The common femoral, profunda femoris and superficial femoral  arteries are widely patent. There is a 50% narrowing of the superficial  femoral artery at the adductor hiatus. The popliteal artery is widely  patent. The anterior tibial artery occludes just distal to its origin. The  tibioperoneal trunk is patent. The posterior tibial artery occludes in the  mid leg. The peroneal artery occludes approximately 3 cm  after its takeoff.  Next, a lateral foot view was obtained which shows reconstitution of the  posterior tibial artery and is the only vessel runoff to the foot. There is  incomplete filling of the plantar arch.   Next, the 5-French sheath was removed and hemostasis obtained with direct  pressure.   OPERATIVE FINDINGS:  1.  Patent iliac and femoral arterial system.  2.  Severe tibial artery occlusive disease with posterior tibial artery      runoff to the foot and incomplete filling of the plantar arch.    CEF/MEDQ  D:  05/17/2005  T:  05/17/2005  Job:  [id number]

## 2011-05-06 ENCOUNTER — Ambulatory Visit (HOSPITAL_COMMUNITY)
Admission: RE | Admit: 2011-05-06 | Discharge: 2011-05-06 | Disposition: A | Discharge status: Home / Self Care | Payer: Medicare Other | Source: Ambulatory Visit | Attending: General Surgery | Admitting: General Surgery

## 2011-05-06 ENCOUNTER — Other Ambulatory Visit (HOSPITAL_BASED_OUTPATIENT_CLINIC_OR_DEPARTMENT_OTHER): Payer: Self-pay | Admitting: General Surgery

## 2011-05-06 DIAGNOSIS — M47814 Spondylosis without myelopathy or radiculopathy, thoracic region: Secondary | ICD-10-CM | POA: Insufficient documentation

## 2011-05-06 DIAGNOSIS — L97509 Non-pressure chronic ulcer of other part of unspecified foot with unspecified severity: Secondary | ICD-10-CM

## 2011-05-11 ENCOUNTER — Ambulatory Visit (HOSPITAL_COMMUNITY)
Admission: RE | Admit: 2011-05-11 | Discharge: 2011-05-11 | Disposition: A | Discharge status: Home / Self Care | Payer: Medicare Other | Source: Ambulatory Visit | Attending: General Surgery | Admitting: General Surgery

## 2011-05-11 DIAGNOSIS — I517 Cardiomegaly: Secondary | ICD-10-CM

## 2011-05-11 DIAGNOSIS — I509 Heart failure, unspecified: Secondary | ICD-10-CM | POA: Insufficient documentation

## 2011-05-11 DIAGNOSIS — E119 Type 2 diabetes mellitus without complications: Secondary | ICD-10-CM | POA: Insufficient documentation

## 2011-05-11 DIAGNOSIS — I10 Essential (primary) hypertension: Secondary | ICD-10-CM | POA: Insufficient documentation

## 2011-05-13 ENCOUNTER — Encounter (HOSPITAL_BASED_OUTPATIENT_CLINIC_OR_DEPARTMENT_OTHER): Payer: Medicare Other | Attending: General Surgery

## 2011-05-13 DIAGNOSIS — L97509 Non-pressure chronic ulcer of other part of unspecified foot with unspecified severity: Secondary | ICD-10-CM | POA: Insufficient documentation

## 2011-05-13 DIAGNOSIS — I509 Heart failure, unspecified: Secondary | ICD-10-CM | POA: Insufficient documentation

## 2011-05-13 DIAGNOSIS — I872 Venous insufficiency (chronic) (peripheral): Secondary | ICD-10-CM | POA: Insufficient documentation

## 2011-05-13 DIAGNOSIS — Z7982 Long term (current) use of aspirin: Secondary | ICD-10-CM | POA: Insufficient documentation

## 2011-05-13 DIAGNOSIS — E1159 Type 2 diabetes mellitus with other circulatory complications: Secondary | ICD-10-CM | POA: Insufficient documentation

## 2011-05-13 DIAGNOSIS — N186 End stage renal disease: Secondary | ICD-10-CM | POA: Insufficient documentation

## 2011-05-13 DIAGNOSIS — Z86718 Personal history of other venous thrombosis and embolism: Secondary | ICD-10-CM | POA: Insufficient documentation

## 2011-05-13 DIAGNOSIS — Z79899 Other long term (current) drug therapy: Secondary | ICD-10-CM | POA: Insufficient documentation

## 2011-05-13 DIAGNOSIS — I890 Lymphedema, not elsewhere classified: Secondary | ICD-10-CM | POA: Insufficient documentation

## 2011-05-13 DIAGNOSIS — I120 Hypertensive chronic kidney disease with stage 5 chronic kidney disease or end stage renal disease: Secondary | ICD-10-CM | POA: Insufficient documentation

## 2011-05-15 ENCOUNTER — Other Ambulatory Visit: Payer: Self-pay | Admitting: Family Medicine

## 2011-05-16 ENCOUNTER — Other Ambulatory Visit (HOSPITAL_BASED_OUTPATIENT_CLINIC_OR_DEPARTMENT_OTHER): Payer: Self-pay | Admitting: General Surgery

## 2011-05-16 LAB — GLUCOSE, CAPILLARY: Glucose-Capillary: 245 mg/dL — ABNORMAL HIGH (ref 70–99)

## 2011-05-16 NOTE — Telephone Encounter (Signed)
Refill request

## 2011-05-17 ENCOUNTER — Other Ambulatory Visit (HOSPITAL_BASED_OUTPATIENT_CLINIC_OR_DEPARTMENT_OTHER): Payer: Self-pay | Admitting: General Surgery

## 2011-05-17 LAB — GLUCOSE, CAPILLARY: Glucose-Capillary: 217 mg/dL — ABNORMAL HIGH (ref 70–99)

## 2011-05-18 NOTE — Telephone Encounter (Signed)
Refill request

## 2011-05-23 ENCOUNTER — Other Ambulatory Visit: Payer: Self-pay | Admitting: Family Medicine

## 2011-05-23 NOTE — Telephone Encounter (Signed)
Refill request

## 2011-05-24 ENCOUNTER — Other Ambulatory Visit (HOSPITAL_BASED_OUTPATIENT_CLINIC_OR_DEPARTMENT_OTHER): Payer: Self-pay | Admitting: General Surgery

## 2011-05-24 LAB — GLUCOSE, CAPILLARY: Glucose-Capillary: 159 mg/dL — ABNORMAL HIGH (ref 70–99)

## 2011-05-25 ENCOUNTER — Other Ambulatory Visit (HOSPITAL_BASED_OUTPATIENT_CLINIC_OR_DEPARTMENT_OTHER): Payer: Self-pay | Admitting: General Surgery

## 2011-05-25 LAB — GLUCOSE, CAPILLARY: Glucose-Capillary: 180 mg/dL — ABNORMAL HIGH (ref 70–99)

## 2011-05-26 ENCOUNTER — Other Ambulatory Visit (HOSPITAL_BASED_OUTPATIENT_CLINIC_OR_DEPARTMENT_OTHER): Payer: Self-pay | Admitting: General Surgery

## 2011-05-27 ENCOUNTER — Other Ambulatory Visit (HOSPITAL_BASED_OUTPATIENT_CLINIC_OR_DEPARTMENT_OTHER): Payer: Self-pay | Admitting: General Surgery

## 2011-05-27 LAB — GLUCOSE, CAPILLARY: Glucose-Capillary: 134 mg/dL — ABNORMAL HIGH (ref 70–99)

## 2011-05-27 NOTE — Telephone Encounter (Signed)
Refill request

## 2011-05-30 ENCOUNTER — Other Ambulatory Visit (HOSPITAL_BASED_OUTPATIENT_CLINIC_OR_DEPARTMENT_OTHER): Payer: Self-pay | Admitting: General Surgery

## 2011-05-30 ENCOUNTER — Ambulatory Visit (HOSPITAL_COMMUNITY)
Admission: RE | Admit: 2011-05-30 | Discharge: 2011-05-30 | Disposition: A | Discharge status: Home / Self Care | Payer: Medicare Other | Source: Ambulatory Visit | Attending: General Surgery | Admitting: General Surgery

## 2011-05-30 DIAGNOSIS — M869 Osteomyelitis, unspecified: Secondary | ICD-10-CM | POA: Insufficient documentation

## 2011-05-30 DIAGNOSIS — E119 Type 2 diabetes mellitus without complications: Secondary | ICD-10-CM | POA: Insufficient documentation

## 2011-05-31 LAB — GLUCOSE, CAPILLARY: Glucose-Capillary: 185 mg/dL — ABNORMAL HIGH (ref 70–99)

## 2011-06-01 NOTE — Telephone Encounter (Signed)
Please advise about this refill for Dr. Hale Bogus patient.

## 2011-06-02 ENCOUNTER — Other Ambulatory Visit (HOSPITAL_BASED_OUTPATIENT_CLINIC_OR_DEPARTMENT_OTHER): Payer: Self-pay | Admitting: General Surgery

## 2011-06-02 NOTE — Telephone Encounter (Signed)
Spoke with Pharmacist and advised that patient must schedule appointment to see MD before any refills on this med.   Atempted calling patient at number listed , message left on voicemail.

## 2011-06-02 NOTE — Telephone Encounter (Signed)
Pt last seen in April for preop clearance and had uncontrolled BP at that time.  It is unclear what his med regimen is at this time. Discussed with pharmacy - he seems to have taken one month of carvedilol 12.5 2 BID, but has an rx with refills in the pharmacy for 6.25 BID.  Pt needs to come in to see a provider and should bring all meds he is taking.  We can recheck his BP and determine what he is taking and what changes we need to make.

## 2011-06-03 ENCOUNTER — Other Ambulatory Visit (HOSPITAL_BASED_OUTPATIENT_CLINIC_OR_DEPARTMENT_OTHER): Payer: Self-pay | Admitting: General Surgery

## 2011-06-03 LAB — GLUCOSE, CAPILLARY
Glucose-Capillary: 201 mg/dL — ABNORMAL HIGH (ref 70–99)
Glucose-Capillary: 175 mg/dL — ABNORMAL HIGH (ref 70–99)

## 2011-06-06 ENCOUNTER — Other Ambulatory Visit (HOSPITAL_BASED_OUTPATIENT_CLINIC_OR_DEPARTMENT_OTHER): Payer: Self-pay | Admitting: General Surgery

## 2011-06-06 ENCOUNTER — Ambulatory Visit (INDEPENDENT_AMBULATORY_CARE_PROVIDER_SITE_OTHER): Payer: Medicare Other | Admitting: Family Medicine

## 2011-06-06 ENCOUNTER — Encounter: Payer: Self-pay | Admitting: Family Medicine

## 2011-06-06 VITALS — BP 188/90 | HR 69 | Temp 98.0°F | Wt 318.2 lb

## 2011-06-06 DIAGNOSIS — I10 Essential (primary) hypertension: Secondary | ICD-10-CM

## 2011-06-06 MED ORDER — CARVEDILOL 6.25 MG PO TABS: 6.2500 mg | ORAL_TABLET | Freq: Two times a day (BID) | ORAL | Status: DC

## 2011-06-06 NOTE — Patient Instructions (Signed)
Nice to meet you Check your blood pressure at the drug store 3 times a week and record values I am starting you on coreg again but lower dose I want to see you again in 1-2 weeks.

## 2011-06-06 NOTE — Progress Notes (Signed)
  Subjective:    Patient here for follow-up of elevated blood pressure.  He is not exercising and is adherent to a low-salt diet.  Blood pressure is not well controlled at home. Cardiac symptoms: chest pain, lower extremity edema and chest pain though was when pt was on bidil. Patient denies: chest pressure/discomfort, dyspnea, fatigue, irregular heart beat, near-syncope and syncope. Cardiovascular risk factors: advanced age (older than 14 for men, 65 for women), diabetes mellitus, dyslipidemia, hypertension, male gender, obesity (BMI >= 30 kg/m2) and sedentary lifestyle. Use of agents associated with hypertension: NSAIDS. History of target organ damage: chronic kidney disease.  The following portions of the patient's history were reviewed and updated as appropriate: allergies, current medications, past family history, past medical history, past social history, past surgical history and problem list.  Review of Systems Pertinent items are noted in HPI.     Objective:    BP 188/90  Pulse 69  Temp(Src) 98 F (36.7 C) (Oral)  Wt 318 lb 3.2 oz (144.335 kg) General appearance: alert and cooperative Throat: lips, mucosa, and tongue normal; teeth and gums normal Lungs: clear to auscultation bilaterally Heart: regular rate and rhythm, S1, S2 normal, no murmur, click, rub or gallop Abdomen: soft, non-tender; bowel sounds normal; no masses,  no organomegaly Extremities: venous stasis dermatitis noted and pt has a ulcer on left foot that tunnels from dorsal aspect, pt is seeing wound care for the foot.  Pulses: 1+ bilateral                                   Assessment:    Hypertension, uncontrolled . Evidence of target organ damage: chronic kidney disease.    Plan:    Medication: started carvidolol again tbut at low dose HR 69 would likely need another medication will f/u in 1-2 weeks.[name]

## 2011-06-06 NOTE — Assessment & Plan Note (Signed)
Discussed care at this time, and would likely need more than one regimen, pt though would like to start his Carvedilol again, warned of the potential heart block with pt having such a low resting heart reate, only felt comfortable starting at 6.25, will have pt come back in 1-2 weeks. At that time would start either hydralazine or clonidine patch for better control.

## 2011-06-07 ENCOUNTER — Other Ambulatory Visit: Payer: Self-pay | Admitting: Family Medicine

## 2011-06-07 ENCOUNTER — Other Ambulatory Visit (HOSPITAL_BASED_OUTPATIENT_CLINIC_OR_DEPARTMENT_OTHER): Payer: Self-pay | Admitting: General Surgery

## 2011-06-07 LAB — GLUCOSE, CAPILLARY: Glucose-Capillary: 133 mg/dL — ABNORMAL HIGH (ref 70–99)

## 2011-06-08 ENCOUNTER — Other Ambulatory Visit (HOSPITAL_BASED_OUTPATIENT_CLINIC_OR_DEPARTMENT_OTHER): Payer: Self-pay | Admitting: General Surgery

## 2011-06-08 LAB — GLUCOSE, CAPILLARY
Glucose-Capillary: 183 mg/dL — ABNORMAL HIGH (ref 70–99)
Glucose-Capillary: 142 mg/dL — ABNORMAL HIGH (ref 70–99)

## 2011-06-10 ENCOUNTER — Other Ambulatory Visit (HOSPITAL_BASED_OUTPATIENT_CLINIC_OR_DEPARTMENT_OTHER): Payer: Self-pay | Admitting: General Surgery

## 2011-06-10 LAB — GLUCOSE, CAPILLARY: Glucose-Capillary: 256 mg/dL — ABNORMAL HIGH (ref 70–99)

## 2011-06-13 ENCOUNTER — Other Ambulatory Visit (HOSPITAL_BASED_OUTPATIENT_CLINIC_OR_DEPARTMENT_OTHER): Payer: Self-pay | Admitting: General Surgery

## 2011-06-13 ENCOUNTER — Encounter (HOSPITAL_BASED_OUTPATIENT_CLINIC_OR_DEPARTMENT_OTHER): Payer: Medicare Other | Attending: General Surgery

## 2011-06-13 DIAGNOSIS — M9080 Osteopathy in diseases classified elsewhere, unspecified site: Secondary | ICD-10-CM | POA: Insufficient documentation

## 2011-06-13 DIAGNOSIS — N183 Chronic kidney disease, stage 3 unspecified: Secondary | ICD-10-CM | POA: Insufficient documentation

## 2011-06-13 DIAGNOSIS — I129 Hypertensive chronic kidney disease with stage 1 through stage 4 chronic kidney disease, or unspecified chronic kidney disease: Secondary | ICD-10-CM | POA: Insufficient documentation

## 2011-06-13 DIAGNOSIS — E1169 Type 2 diabetes mellitus with other specified complication: Secondary | ICD-10-CM | POA: Insufficient documentation

## 2011-06-13 DIAGNOSIS — I739 Peripheral vascular disease, unspecified: Secondary | ICD-10-CM | POA: Insufficient documentation

## 2011-06-13 DIAGNOSIS — E785 Hyperlipidemia, unspecified: Secondary | ICD-10-CM | POA: Insufficient documentation

## 2011-06-13 DIAGNOSIS — I872 Venous insufficiency (chronic) (peripheral): Secondary | ICD-10-CM | POA: Insufficient documentation

## 2011-06-13 DIAGNOSIS — M869 Osteomyelitis, unspecified: Secondary | ICD-10-CM | POA: Insufficient documentation

## 2011-06-13 DIAGNOSIS — L97509 Non-pressure chronic ulcer of other part of unspecified foot with unspecified severity: Secondary | ICD-10-CM | POA: Insufficient documentation

## 2011-06-14 ENCOUNTER — Other Ambulatory Visit (HOSPITAL_BASED_OUTPATIENT_CLINIC_OR_DEPARTMENT_OTHER): Payer: Self-pay | Admitting: General Surgery

## 2011-06-14 LAB — GLUCOSE, CAPILLARY
Glucose-Capillary: 180 mg/dL — ABNORMAL HIGH (ref 70–99)
Glucose-Capillary: 217 mg/dL — ABNORMAL HIGH (ref 70–99)
Glucose-Capillary: 171 mg/dL — ABNORMAL HIGH (ref 70–99)
Glucose-Capillary: 182 mg/dL — ABNORMAL HIGH (ref 70–99)

## 2011-06-16 ENCOUNTER — Other Ambulatory Visit (HOSPITAL_BASED_OUTPATIENT_CLINIC_OR_DEPARTMENT_OTHER): Payer: Self-pay | Admitting: General Surgery

## 2011-06-16 LAB — GLUCOSE, CAPILLARY: Glucose-Capillary: 177 mg/dL — ABNORMAL HIGH (ref 70–99)

## 2011-06-17 ENCOUNTER — Other Ambulatory Visit (HOSPITAL_BASED_OUTPATIENT_CLINIC_OR_DEPARTMENT_OTHER): Payer: Self-pay | Admitting: General Surgery

## 2011-06-17 LAB — GLUCOSE, CAPILLARY: Glucose-Capillary: 157 mg/dL — ABNORMAL HIGH (ref 70–99)

## 2011-06-19 ENCOUNTER — Other Ambulatory Visit: Payer: Self-pay | Admitting: Family Medicine

## 2011-06-20 ENCOUNTER — Ambulatory Visit (INDEPENDENT_AMBULATORY_CARE_PROVIDER_SITE_OTHER): Payer: Medicare Other | Admitting: Family Medicine

## 2011-06-20 ENCOUNTER — Encounter: Payer: Self-pay | Admitting: Family Medicine

## 2011-06-20 VITALS — BP 179/75 | HR 74 | Temp 98.1°F | Ht 66.0 in | Wt 315.9 lb

## 2011-06-20 DIAGNOSIS — E1165 Type 2 diabetes mellitus with hyperglycemia: Secondary | ICD-10-CM

## 2011-06-20 DIAGNOSIS — I10 Essential (primary) hypertension: Secondary | ICD-10-CM

## 2011-06-20 DIAGNOSIS — E118 Type 2 diabetes mellitus with unspecified complications: Secondary | ICD-10-CM

## 2011-06-20 LAB — BASIC METABOLIC PANEL
BUN: 22 mg/dL (ref 6–23)
CO2: 21 mEq/L (ref 19–32)
Calcium: 8.5 mg/dL (ref 8.4–10.5)
Glucose, Bld: 135 mg/dL — ABNORMAL HIGH (ref 70–99)
Potassium: 4 mEq/L (ref 3.5–5.3)
Sodium: 140 mEq/L (ref 135–145)

## 2011-06-20 MED ORDER — HYDRALAZINE HCL 50 MG PO TABS: 50.0000 mg | ORAL_TABLET | Freq: Three times a day (TID) | ORAL | Status: DC

## 2011-06-20 MED ORDER — FUROSEMIDE 80 MG PO TABS: 80.0000 mg | ORAL_TABLET | Freq: Every day | ORAL | Status: DC

## 2011-06-20 NOTE — Patient Instructions (Signed)
Your blood sugars show improvement, A1c was 7.4 which is better, still would like to get you under 7.  We will check again in 3 months.  Keep watching your diet.  Your blood pressure still is not under control.  We will add another medicine called hydralazine.  Take 1 pill three times a day I do want to check your kidneys and your potassium level today I will call you with the results. For your dizziness I think it may be related to your blood pressure but if it worsens or you have trouble speaking or numbness in an extremity seek medical attention immediately.   I want to see you again in 2-4 weeks.

## 2011-06-21 ENCOUNTER — Other Ambulatory Visit (HOSPITAL_BASED_OUTPATIENT_CLINIC_OR_DEPARTMENT_OTHER): Payer: Self-pay | Admitting: General Surgery

## 2011-06-21 NOTE — Progress Notes (Signed)
  Subjective:    Patient ID: [name], male    DOB: February 21, 1951, 60 y.o.   MRN: [id number]  HPI  Pt is back here for follow up on DM and HTN  1. Hypertension Blood pressure at home: SBP in the 170-180 range (which is improved).  Blood pressure today: 179/75 Taking Meds:started on carvidolol unable to increase due to low HR of 58 now.  Side effects: been feeling good but has been having some AM headaches and that seems to be when the blood pressure was the highest.  ROS: Denies headache visual changes nausea, vomiting, chest pain or abdominal pain or shortness of breath.   Diabetes:  High at home:200's Low at home:120 Taking medications: including lantus Side effects:none improvement in A1c been limiting the carbohydrates. ROS: denies fever, chills, dizziness, loss of conscieness, polyuria poly dipsia numbness or tingling in extremities or chest pain.  Am  Headaches does have hx of snoring no weakness or numbness in extremities, Pt has never had a sleep  Study before. No visual changes either.   Pt is being treated for wound on left foot and likely will be losing his left toe will need surgical clearance has not been back to cardiologist or nephrologist. Pt needed better control of blood pressure before clearance. Pt states he does not have a cardiologist anymore and has not seen his nephrologist for over a year. Pt was also likely to have bypass surgery on his right leg for PVD but needs clearance as well.   Review of Systems Denies fever, chills, nausea vomiting abdominal pain, dysuria, chest pain, shortness of breath dyspnea on exertion or numbness in extremities Past medical history, social, surgical and family history all reviewed. See problem list.      Objective:   Physical Exam    General appearance: alert and cooperative Throat: lips, mucosa, and tongue normal; teeth and gums normal Lungs: clear to auscultation bilaterally Heart: regular rate and rhythm, S1, S2  normal, no murmur, click, rub or gallop Abdomen: soft, non-tender; bowel sounds normal; no masses,  no organomegaly Extremities: venous stasis dermatitis noted and pt has a ulcer on left foot that tunnels from dorsal aspect, pt is seeing wound care for the foot. No change from previous visit. Still significant swelling both legs bilaterally.  Pulses: 1+ bilateral     Assessment & Plan:

## 2011-06-21 NOTE — Assessment & Plan Note (Signed)
Obesity, HTN and AM headaches make one concern for sleep apnea will send pt for sleep study.

## 2011-06-21 NOTE — Assessment & Plan Note (Signed)
Improvement from last visit will add hydralazine unable to do clonidine patch due to pt being treated with hyperbaric chamber for his wounds.  Will have pt return in two weeks and see how blood pressure responds.

## 2011-06-21 NOTE — Assessment & Plan Note (Signed)
Mild improvement in A1c encourage the continue lifestyle changes and will continue current regimen and will check again in 3 months.

## 2011-06-21 NOTE — Assessment & Plan Note (Signed)
Encourage pt to follow up with nephrologist, will get BMET to see kidney function and potassium with pt on lasix chronically.

## 2011-06-22 ENCOUNTER — Other Ambulatory Visit (HOSPITAL_BASED_OUTPATIENT_CLINIC_OR_DEPARTMENT_OTHER): Payer: Self-pay | Admitting: General Surgery

## 2011-06-22 LAB — GLUCOSE, CAPILLARY: Glucose-Capillary: 212 mg/dL — ABNORMAL HIGH (ref 70–99)

## 2011-06-24 ENCOUNTER — Inpatient Hospital Stay (HOSPITAL_COMMUNITY)
Admission: RE | Admit: 2011-06-24 | Discharge: 2011-06-30 | DRG: 617 | Disposition: A | Discharge status: Home Health | Payer: Medicare Other | Source: Ambulatory Visit | Attending: Specialist | Admitting: Specialist

## 2011-06-24 ENCOUNTER — Other Ambulatory Visit: Payer: Self-pay | Admitting: Specialist

## 2011-06-24 DIAGNOSIS — M869 Osteomyelitis, unspecified: Secondary | ICD-10-CM | POA: Diagnosis present

## 2011-06-24 DIAGNOSIS — I129 Hypertensive chronic kidney disease with stage 1 through stage 4 chronic kidney disease, or unspecified chronic kidney disease: Secondary | ICD-10-CM | POA: Diagnosis present

## 2011-06-24 DIAGNOSIS — Z79899 Other long term (current) drug therapy: Secondary | ICD-10-CM

## 2011-06-24 DIAGNOSIS — I872 Venous insufficiency (chronic) (peripheral): Secondary | ICD-10-CM | POA: Diagnosis present

## 2011-06-24 DIAGNOSIS — Z7982 Long term (current) use of aspirin: Secondary | ICD-10-CM

## 2011-06-24 DIAGNOSIS — M9080 Osteopathy in diseases classified elsewhere, unspecified site: Secondary | ICD-10-CM | POA: Diagnosis present

## 2011-06-24 DIAGNOSIS — L97509 Non-pressure chronic ulcer of other part of unspecified foot with unspecified severity: Secondary | ICD-10-CM | POA: Diagnosis present

## 2011-06-24 DIAGNOSIS — I70209 Unspecified atherosclerosis of native arteries of extremities, unspecified extremity: Secondary | ICD-10-CM | POA: Diagnosis present

## 2011-06-24 DIAGNOSIS — Z89429 Acquired absence of other toe(s), unspecified side: Secondary | ICD-10-CM | POA: Insufficient documentation

## 2011-06-24 DIAGNOSIS — E1169 Type 2 diabetes mellitus with other specified complication: Principal | ICD-10-CM | POA: Diagnosis present

## 2011-06-24 DIAGNOSIS — N183 Chronic kidney disease, stage 3 unspecified: Secondary | ICD-10-CM | POA: Diagnosis present

## 2011-06-24 DIAGNOSIS — E785 Hyperlipidemia, unspecified: Secondary | ICD-10-CM | POA: Diagnosis present

## 2011-06-24 DIAGNOSIS — S98139A Complete traumatic amputation of one unspecified lesser toe, initial encounter: Secondary | ICD-10-CM

## 2011-06-24 LAB — GLUCOSE, CAPILLARY
Glucose-Capillary: 98 mg/dL (ref 70–99)
Glucose-Capillary: 98 mg/dL (ref 70–99)

## 2011-06-24 LAB — URINALYSIS, ROUTINE W REFLEX MICROSCOPIC
Bilirubin Urine: NEGATIVE
Ketones, ur: NEGATIVE mg/dL
Specific Gravity, Urine: 1.021 (ref 1.005–1.030)
Urobilinogen, UA: 0.2 mg/dL (ref 0.0–1.0)

## 2011-06-24 LAB — PROTIME-INR: INR: 1.01 (ref 0.00–1.49)

## 2011-06-24 LAB — COMPREHENSIVE METABOLIC PANEL
ALT: 11 U/L (ref 0–53)
AST: 13 U/L (ref 0–37)
Alkaline Phosphatase: 78 U/L (ref 39–117)
CO2: 22 mEq/L (ref 19–32)
Calcium: 8.6 mg/dL (ref 8.4–10.5)
Chloride: 108 mEq/L (ref 96–112)
GFR calc non Af Amer: 36 mL/min — ABNORMAL LOW (ref >60)
Potassium: 4 mEq/L (ref 3.5–5.1)
Sodium: 140 mEq/L (ref 135–145)
Total Bilirubin: 0.3 mg/dL (ref 0.3–1.2)

## 2011-06-24 LAB — URINE MICROSCOPIC-ADD ON

## 2011-06-24 LAB — SURGICAL PCR SCREEN: MRSA, PCR: NEGATIVE

## 2011-06-24 LAB — CBC
HCT: 30.7 % — ABNORMAL LOW (ref 39.0–52.0)
Hemoglobin: 10.8 g/dL — ABNORMAL LOW (ref 13.0–17.0)
MCV: 79.3 fL (ref 78.0–100.0)
RBC: 3.87 MIL/uL — ABNORMAL LOW (ref 4.22–5.81)
WBC: 12.4 10*3/uL — ABNORMAL HIGH (ref 4.0–10.5)

## 2011-06-24 LAB — DIFFERENTIAL
Basophils Absolute: 0 10*3/uL (ref 0.0–0.1)
Lymphocytes Relative: 20 % (ref 12–46)
Lymphs Abs: 2.4 10*3/uL (ref 0.7–4.0)
Neutro Abs: 8.9 10*3/uL — ABNORMAL HIGH (ref 1.7–7.7)

## 2011-06-25 LAB — GLUCOSE, CAPILLARY
Glucose-Capillary: 133 mg/dL — ABNORMAL HIGH (ref 70–99)
Glucose-Capillary: 170 mg/dL — ABNORMAL HIGH (ref 70–99)
Glucose-Capillary: 189 mg/dL — ABNORMAL HIGH (ref 70–99)

## 2011-06-25 LAB — CBC
HCT: 28.2 % — ABNORMAL LOW (ref 39.0–52.0)
Hemoglobin: 9.4 g/dL — ABNORMAL LOW (ref 13.0–17.0)
RBC: 3.49 MIL/uL — ABNORMAL LOW (ref 4.22–5.81)
WBC: 10.4 10*3/uL (ref 4.0–10.5)

## 2011-06-25 LAB — BASIC METABOLIC PANEL
CO2: 26 mEq/L (ref 19–32)
Chloride: 105 mEq/L (ref 96–112)
Sodium: 137 mEq/L (ref 135–145)

## 2011-06-26 LAB — GLUCOSE, CAPILLARY: Glucose-Capillary: 146 mg/dL — ABNORMAL HIGH (ref 70–99)

## 2011-06-27 DIAGNOSIS — E118 Type 2 diabetes mellitus with unspecified complications: Secondary | ICD-10-CM

## 2011-06-27 DIAGNOSIS — I10 Essential (primary) hypertension: Secondary | ICD-10-CM

## 2011-06-27 LAB — GLUCOSE, CAPILLARY
Glucose-Capillary: 122 mg/dL — ABNORMAL HIGH (ref 70–99)
Glucose-Capillary: 202 mg/dL — ABNORMAL HIGH (ref 70–99)

## 2011-06-28 DIAGNOSIS — M869 Osteomyelitis, unspecified: Secondary | ICD-10-CM

## 2011-06-28 LAB — CBC
MCH: 27.5 pg (ref 26.0–34.0)
MCHC: 34 g/dL (ref 30.0–36.0)
Platelets: 205 10*3/uL (ref 150–400)

## 2011-06-28 LAB — BASIC METABOLIC PANEL
Calcium: 8.6 mg/dL (ref 8.4–10.5)
GFR calc Af Amer: 41 mL/min — ABNORMAL LOW (ref >60)
GFR calc non Af Amer: 34 mL/min — ABNORMAL LOW (ref >60)
Sodium: 142 mEq/L (ref 135–145)

## 2011-06-28 LAB — TISSUE CULTURE: Culture: NO GROWTH

## 2011-06-28 LAB — GLUCOSE, CAPILLARY
Glucose-Capillary: 133 mg/dL — ABNORMAL HIGH (ref 70–99)
Glucose-Capillary: 190 mg/dL — ABNORMAL HIGH (ref 70–99)
Glucose-Capillary: 167 mg/dL — ABNORMAL HIGH (ref 70–99)

## 2011-06-28 LAB — SEDIMENTATION RATE: Sed Rate: 117 mm/hr — ABNORMAL HIGH (ref 0–16)

## 2011-06-28 LAB — C-REACTIVE PROTEIN: CRP: 4.6 mg/dL — ABNORMAL HIGH (ref <0.6)

## 2011-06-29 LAB — GLUCOSE, CAPILLARY
Glucose-Capillary: 173 mg/dL — ABNORMAL HIGH (ref 70–99)
Glucose-Capillary: 160 mg/dL — ABNORMAL HIGH (ref 70–99)

## 2011-06-29 LAB — ANAEROBIC CULTURE

## 2011-06-29 NOTE — Op Note (Signed)
NAME:  [name], [name]               ACCOUNT NO.:  [id number]  MEDICAL RECORD NO.:  [id number]  LOCATION:  5023                         FACILITY:  [facility]  PHYSICIAN:  Jessy Oto, M.D.   DATE OF BIRTH:  1951-05-22  DATE OF PROCEDURE:  06/24/2011 DATE OF DISCHARGE:                              OPERATIVE REPORT   PREOPERATIVE DIAGNOSIS:  Left foot diabetic neurotrophic ulcers, stage 6 with underlying osteomyelitis involving the left fifth metatarsophalangeal joint as well as the left fifth proximal metatarsal.  POSTOPERATIVE DIAGNOSIS:  Left foot diabetic neurotrophic ulcers, stage 6 with underlying osteomyelitis involving the left fifth metatarsophalangeal joint as well as the left fifth proximal metatarsal. The patient does have ulcers underlying the left fifth metatarsophalangeal joint as well as underlying the left fifth plantar aspect of the proximal one-third of the metatarsal.  PROCEDURE:  Left fifth ray excision with closure of wound over drains and packing of 2 neurotrophic ulcers with normal saline wet-to-dry.  SURGEON:  Jessy Oto, MD  ASSISTANT:  [name], [facility]  ANESTHESIA:  Ankle block, Dr. Sherren Kerns.  ESTIMATED BLOOD LOSS:  75 mL.  DRAINS:  Two quarter-inch Penrose drains to the proximal incision site and packing of the distal incision site with quarter-inch packing normal saline soaked as well as packing of neurotrophic ulcer previously underlying the fifth metatarsophalangeal joint and more shallow ulcer underlying the proximal one-third of the metatarsal shaft.  TOURNIQUET TIME:  Zero minutes.  BRIEF CLINICAL HISTORY:  This patient is a 60 year old male with diabetes that is controlled by oral agents and diet.  He is obese.  He has a history of venous stasis disease of both lower extremities.  He has undergone previous surgery to the right foot in 2006.  At that time, he was found to have ulcer involving the right foot and underwent an incision  drainage and debridement of the right fifth metatarsophalangeal joint.  This average develop difficulties with healing and eventually the patient underwent bypass grafting which improved circulation and the right foot went on to heal uneventfully.  He has been seen apparently over the last half year at [facility] and there he has been undergoing debridements of an ulcer underlying the left fifth metatarsophalangeal joint as well as an ulcer over the proximal metatarsal.  He was sent to our office for evaluation as his radiographs from June had demonstrated increasing osteolysis associated with left fifth metatarsophalangeal joint consistent with osteomyelitis. Radiographs obtained in the office on June 22, 2011, demonstrated osteolysis of both the patient's left fifth metatarsophalangeal joint as well as the proximal aspect and base of the fifth metatarsal so that it was felt that a ray resection would be indicated.  The patient understands the risks and benefits of the procedure and he signed informed consent.  DESCRIPTION OF PROCEDURE:  The patient is seen in the preoperative holding area, had marking of the left lower extremity over the anterior tibia with an X and my initials.  Seen in the preoperative holding area. He had significant swelling of his left lower extremity with edema changes and pinning occurring from the left tibia to the left foot consistent with venous stasis  disease.  The patient's right leg has an Haematologist already in place.  The patient understands that Unna boot will be applied to the left leg following the surgical procedure and used to help diminish venous stasis and improve his chances of healing here. All questions were answered.  No antibiotics were received preoperatively.  Indeed his antibiotics were stopped 2 days prior to surgery in order to allow for adequate cultures so that the possibility of identifying an organism involved within the  infection here would be improved.  The patient transported to the OR via stretcher OR, room #10 at [facility] was used for this procedure.  The patient underwent a left ankle block by Dr. Sherren Kerns just above the ankle and it provided excellent anesthesia to the left foot.  Left foot was then prepped with Betadine scrub prep solution from the toes to the left knee.  Tourniquet was placed about the left thigh, though it was not used during the procedure as the patient did not have the general anesthesia.  The left leg with prep was then draped usual manner.  The patient's foot was found to have areas of desquamations in between the digits distally over the plantar/dorsal surface of the foot so that these areas were lightly cleaned and then the foot was reprepped with DuraPrep solution, and an iodine dye drape applied to try and exclude the operating field from the remainder of the foot.  Standard time-out protocol was carried out identifying the procedure to the patient's side of procedure, expected length of procedure, estimated blood loss.  The patient then had an incision made in racket style about the base of the left little toe proximal phalanx extending then over the dorsal lateral aspect of the metatarsal to its base.  The incision was to the skin and subcu layers directly down to the metatarsal shaft distally and proximally along the dorsal lateral aspect of the metatarsal.  Incision about the base of the digit was carried down to the metatarsophalangeal joint.  Subperiosteal dissection was then carried about the proximal aspect of the fifth metatarsal just above the patient's fifth metatarsal base.  Then, a Estill Cotta rongeur was used to remove bone dividing the proximal aspect of the metatarsal at a point about an inch and half from the base of the metatarsal and fifth metatarsal cuboid joint.  The metatarsal was then retracted laterally and carefully using a scalpel and soft  tissue attachments along the medial aspect of metatarsal were released from proximal to distal, and the metatarsal phalangeal joint and then resected along with the patient's some small toe specimen. Note that cultures were obtained from the end of the proximal cut surface of the fifth metatarsal bone and small portions of bone here were also additionally sent for both anaerobic and aerobic culturing. The base of the wounds appeared to be quite clean at this point with the metatarsal resected.  The area over the lateral aspect of the base of the fourth toe metatarsophalangeal joint showed excellent bleeding appearance.  Irrigation was then carried out using normal saline and then double antibiotic solution 250 mL.  The bone at the base of the fifth metatarsal was found to be fragmented and showing areas of gray suspect purulence so that this area of soft bone was debrided back to the metatarsocuboid joint.  The insertion site and the peroneus brevis was left intact with a small portion of bone lateral proximal that appeared to be in good condition and appeared  to hold the peroneal tendons stable here.  With this then, further irrigation was carried out.  The skin edges were then approximated extending to within a centimeter of the end of the incision site for the racket style ray resection here incision line.  Simple sutures of 3-0 Prolene were used to approximate the edges as well as 1 or 2 vertical mattress sutures. The patient had the wound left more open over the proximal aspect of the incision site in order to allow for drainage of the base of the ray resection, wound site, and into the area of the joint here.  Into this lateral wound then was installed two quarter-inch Penrose drains to allow for drainage here deeply.  The patient was found to have extension of the neurotrophic ulcer underlying the fifth metatarsophalangeal joint, going into the joint area so this area was lightly  debrided and then packed with half-inch gauze and normal saline wet-to-dry. Additional packing was placed over the distal wound site for the patient's the incision for the ray resection, and then over the proximal neurotrophic ulcer underlying proximal aspect of the fifth metatarsal, which appeared to be grade 2.  Adaptic, 4 x 4's, ABD pad was then applied, and then Unna material was applied to the left foot holding the dressing in place and then extending proximally up the leg just below the knee to allow for venous treatment.  Note that standard time-out protocol was carried out prior to this patient's surgical procedures. At the end of the case, all instrument, sponge counts were correct.  The physician assistant's responsibilities during this procedure, [name], performed the duties of assistant surgeon assisting with the exposure of the patient's left fifth metatarsal bone with retraction of the digit with its resection.  She assisted then in the application of the material at the end of the case and the application of dressing.     Jessy Oto, M.D.     JEN/MEDQ  D:  06/24/2011  T:  06/25/2011  Job:  [id number]  Electronically Signed by Basil Dess M.D. on 06/29/2011 08:40:59 PM

## 2011-06-30 LAB — GLUCOSE, CAPILLARY: Glucose-Capillary: 197 mg/dL — ABNORMAL HIGH (ref 70–99)

## 2011-07-03 NOTE — Consult Note (Signed)
[name], [name]               ACCOUNT NO.:  [id number]  MEDICAL RECORD NO.:  [id number]  LOCATION:                                 FACILITY:  PHYSICIAN:  [place] A. Walker Kehr, M.D.    DATE OF BIRTH:  Oct 11, 1951  DATE OF CONSULTATION: DATE OF DISCHARGE:                                CONSULTATION   PRIMARY CARE PROVIDER:  Hulan Saas, DO, at [facility].  CHIEF COMPLAINT:  Diabetes and blood pressure management.  HISTORY OF PRESENT ILLNESS:  The patient is a 60 year old male with known diabetes, hypertension, hyperlipidemia, peripheral vascular disease, and CKD stage III with is now several days status post a left fifth ray excision secondary to osteomyelitis.  Over the past several days, his blood pressures and CBGs have been somewhat hard to control, so the Orthopedic Service requested consultation from the Family Medicine Service to help in management of these problems.  Of note, the patient's blood pressure has been hard to control as an outpatient and most recent A1c was 7.4.  He has been being managed on glipizide and Januvia for his diabetes and has been showing gradual improvement in his A1c through lifestyle modification and encouragement of weight loss.  PAST MEDICAL HISTORY: 1. Diabetes. 2. Hypertension. 3. Hyperlipidemia. 4. Chronic kidney disease, stage III. 5. Peripheral vascular disease. 6. Venous insufficiency. 7. Morbid obesity. 8. Chronic cystitis. 9. Nephrolithiasis.  FAMILY HISTORY:  Positive for diabetes and coronary artery disease in first-degree relatives.  SOCIAL HISTORY:  No significant current alcohol, tobacco, or drug usage.  ALLERGIES:  IODINE.  CURRENT MEDICATIONS: 1. Norvasc 10 mg by mouth daily. 2. Aspirin 81 mg by mouth daily. 3. Coreg 6.25 mg by mouth daily. 4. Colace 100 mg by mouth twice daily. 5. Lovenox 70 units subcutaneously daily. 6. Lasix 80 mg by mouth daily. 7. Glipizide 10 mg by mouth twice daily. 8.  Hydralazine 50 mg by mouth 3 times daily. 9. Crestor 20 mg by mouth daily. 10.Sliding scale insulin. 11.Vancomycin and Zosyn per Pharmacy. 12.Norco p.r.n.  PHYSICAL EXAMINATION:  VITAL SIGNS:  Afebrile, pulse in the 60s-70s, blood pressure is in the A999333 systolic. GENERAL:  No acute distress. HEENT:  Mucous membranes moist.  Extraocular movements intact. CARDIOVASCULAR:  Regular rate and rhythm. RESPIRATIONS:  Clear to auscultation bilaterally. ABDOMEN:  Obese but soft, nontender, and nondistended. EXTREMITIES:  Left lower extremity is wrapped in a dressing.  Has preserved sensation and movement, right lower extremity with no acute findings.  Both lower extremities do show significant signs of venous stasis.  LABORATORY DATA AND IMAGING:  No recent labs.  CBG have ranged between 122 and 284 today and 9 units of sliding scale insulin has been given.  ASSESSMENT AND PLAN:  The patient is a 60 year old male with known diabetes, hypertension, hyperlipidemia, and peripheral vascular disease who is status post fifth ray excision for osteomyelitis. 1. Diabetes:  As you are aiming for somewhat tight control of this     patient's blood sugar in the postoperative period, we will     discontinue the patient's glipizide if this medication can cause     somewhat unpredictable hypoglycemia and combined the higher  doses     of insulin.  We will start the patient on 4 units of Lantus in the     morning and monitor his CBGs throughout the day.  Depending what     the patient's blood sugars are averaging, we may consider adding     some scheduled mealtime insulin while the patient remains     hospitalized or may decide to increase the patient's sliding scale     sensitive to moderate.  I feel that the latter is likely to route     that we will take as the patient is a known diabetic who is     morbidly obese, so we are likely under-treating his blood sugars at     the moment.  As his A1c is  7.4 with a goal A1c of less than 7 and     the patient is on oral medications as an outpatient, we would not     expect him to go home on insulin, but we will use insulin in this     immediate postoperative period for tighter control of the patient's     blood sugars. 2. Hypotension:  The patient has had very poorly controlled blood     pressures as an outpatient and his current pressures are relatively     similar to our readings in clinic.  With the pulse primarily in the     60s and a history of problems with heart block on a higher dose of     beta blockers, I am hesitant to increase this patient's dose of     beta-blocker.  I cannot find a contraindication to giving the     patient a trial on an ACE as he would likely benefit from renal     protection in the setting of his chronic kidney disease.  We will     plan to start the patient on 10 mg of lisinopril daily, but we will     discuss this further with the team in the morning.  The patient     does not report any allergies to this medication and reports having     been on it in the past.  He was discontinued for a reason the     patient does not remember.  Looking through our clinic note, I can     see no indication as to why this was done. 3. Chronic kidney disease:  The patient's last creatinine was actually     at or slightly better than his baseline.  We will recheck this in     the morning.  Continue current management with adequate hydration     and monitor likely every 2 days. 4. Fluids, electrolytes, nutrition and gastrointestinal:  Carb-     modified diet, IV fluids per Primary. 5. Prophylaxis:  Subcu Lovenox. 6. Code status:  Full code. 7. Disposition:  Per Orthopedics.  Thank you so much for allowing [place] to help in the care of this patient. Please feel free to call with questions or concerns.    ______________________________ Vickie Epley, MD   ______________________________ Arty Baumgartner. Walker Kehr, M.D.    ER/MEDQ   D:  06/28/2011  T:  06/28/2011  Job:  [id number]  Electronically Signed by Andree Moro MD on 07/02/2011 07:23:33 PM Electronically Signed by Candelaria Celeste M.D. on 07/03/2011 04:48:05 PM

## 2011-07-06 DIAGNOSIS — Z89429 Acquired absence of other toe(s), unspecified side: Secondary | ICD-10-CM

## 2011-07-11 ENCOUNTER — Telehealth: Payer: Self-pay | Admitting: Family Medicine

## 2011-07-11 NOTE — Telephone Encounter (Signed)
Returned call and got her VM.  Left a message to call us back,.

## 2011-07-11 NOTE — Telephone Encounter (Signed)
Patient had just taken his meds when the nurse arrived.  Normally he takes them earlier.  She said he seemed fine, no c/o headache of dizziness.  According to his flowsheet he has run high a few times before. He has a follow up appointment with his surgeon on Wednesday.  Told Angie that if he is still running high mid week to call us back or have him call us back.  She was agreeable.

## 2011-07-11 NOTE — Telephone Encounter (Signed)
Mr. Rosenboom BP was 198/90 and had just taken his medication when she got there.  If the nurse needs to call her back about this, that is ok.

## 2011-07-13 ENCOUNTER — Encounter (HOSPITAL_BASED_OUTPATIENT_CLINIC_OR_DEPARTMENT_OTHER): Payer: Medicare Other | Attending: General Surgery

## 2011-07-13 DIAGNOSIS — M9080 Osteopathy in diseases classified elsewhere, unspecified site: Secondary | ICD-10-CM | POA: Insufficient documentation

## 2011-07-13 DIAGNOSIS — L97509 Non-pressure chronic ulcer of other part of unspecified foot with unspecified severity: Secondary | ICD-10-CM | POA: Insufficient documentation

## 2011-07-13 DIAGNOSIS — M869 Osteomyelitis, unspecified: Secondary | ICD-10-CM | POA: Insufficient documentation

## 2011-07-13 DIAGNOSIS — I872 Venous insufficiency (chronic) (peripheral): Secondary | ICD-10-CM | POA: Insufficient documentation

## 2011-07-13 DIAGNOSIS — I739 Peripheral vascular disease, unspecified: Secondary | ICD-10-CM | POA: Insufficient documentation

## 2011-07-13 DIAGNOSIS — N183 Chronic kidney disease, stage 3 unspecified: Secondary | ICD-10-CM | POA: Insufficient documentation

## 2011-07-13 DIAGNOSIS — E1169 Type 2 diabetes mellitus with other specified complication: Secondary | ICD-10-CM | POA: Insufficient documentation

## 2011-07-13 DIAGNOSIS — I129 Hypertensive chronic kidney disease with stage 1 through stage 4 chronic kidney disease, or unspecified chronic kidney disease: Secondary | ICD-10-CM | POA: Insufficient documentation

## 2011-07-13 DIAGNOSIS — E785 Hyperlipidemia, unspecified: Secondary | ICD-10-CM | POA: Insufficient documentation

## 2011-07-14 ENCOUNTER — Ambulatory Visit (INDEPENDENT_AMBULATORY_CARE_PROVIDER_SITE_OTHER): Payer: Medicare Other | Admitting: Internal Medicine

## 2011-07-14 ENCOUNTER — Encounter: Payer: Self-pay | Admitting: Internal Medicine

## 2011-07-14 VITALS — BP 188/85 | HR 72 | Temp 98.2°F | Ht 71.0 in | Wt 317.0 lb

## 2011-07-14 DIAGNOSIS — M869 Osteomyelitis, unspecified: Secondary | ICD-10-CM

## 2011-07-14 NOTE — Progress Notes (Signed)
  Subjective:    Patient ID: [name], male    DOB: [birth year], 60 y.o.   MRN: [id number]  HPI [name] is in for his hospital followup visit. He is a 60 year old with diabetes mellitus and peripheral vascular disease who developed left foot neuropathic ulcers after injuring his right knee he recently. He was receiving care at the wound center for his ulcers and lymphedema. He has had multiple courses of antibiotics including ampicillin, doxycycline, Cipro and Augmentin but had continued drainage from his foot. An x-ray he revealed destruction of the left fifth metatarsal base. He was admitted to the hospital last month in the left fifth ray was excised on July 13 for the bulk of the infection was surgically removed. However Dr. Louanne Skye noted some evidence of infection near the base of the metatarsals so I recommended continuing antibiotic therapy for several weeks postoperatively. He is now on his 20th postop day of antibiotics and is taking Augmentin. He has tolerated it well. Cultures from wound drainage on June 1 grew enterococcus and Klebsiella. Klebsiella was grown again on June 15 and enterococcus again on June 30. All organisms were sensitive to Augmentin. [facility] has been coming every other day to change his Unna boot dressing. He states the stitches have been removed, he has minimal drainage and no pain.    Review of Systems     Objective:   Physical Exam  Constitutional:       He is walking with the assistance of a cane.  Musculoskeletal:       His foot is in an Unna boot dressing to the level of his knees so I did not remove it for an exam.          Assessment & Plan:

## 2011-07-14 NOTE — Assessment & Plan Note (Signed)
I am hopeful that he will be cured with his oral antibiotic therapy after recent fifth ray excision. His sedimentation rate and C-reactive protein were quite elevated when he was hospitalized. I will repeat them today and have asked him to have his home nurse call me tomorrow so I can review the situation of his wound before making a decision as to when to stop his antibiotics.

## 2011-07-14 NOTE — Patient Instructions (Signed)
Please have Angie call me on my pager, [phone].

## 2011-07-15 ENCOUNTER — Telehealth: Payer: Self-pay | Admitting: *Deleted

## 2011-07-15 LAB — SEDIMENTATION RATE: Sed Rate: 81 mm/hr — ABNORMAL HIGH (ref 0–16)

## 2011-07-15 NOTE — Telephone Encounter (Signed)
Pt called asking for md pager number so he could tell him how the wound was. I asked to speak with [place] who was there. She did not want to call md. States it looks good. Told her if ever a problem or need of orders call us & we can give her pager # or we can page him

## 2011-07-19 ENCOUNTER — Encounter: Payer: Self-pay | Admitting: Family Medicine

## 2011-07-19 ENCOUNTER — Encounter: Payer: Self-pay | Admitting: Internal Medicine

## 2011-07-19 ENCOUNTER — Ambulatory Visit (INDEPENDENT_AMBULATORY_CARE_PROVIDER_SITE_OTHER): Payer: Medicare Other | Admitting: Family Medicine

## 2011-07-19 ENCOUNTER — Ambulatory Visit (INDEPENDENT_AMBULATORY_CARE_PROVIDER_SITE_OTHER): Payer: Medicare Other | Admitting: Internal Medicine

## 2011-07-19 VITALS — BP 184/84 | HR 63 | Temp 98.0°F | Wt 318.0 lb

## 2011-07-19 DIAGNOSIS — I10 Essential (primary) hypertension: Secondary | ICD-10-CM

## 2011-07-19 DIAGNOSIS — E1165 Type 2 diabetes mellitus with hyperglycemia: Secondary | ICD-10-CM

## 2011-07-19 DIAGNOSIS — I70219 Atherosclerosis of native arteries of extremities with intermittent claudication, unspecified extremity: Secondary | ICD-10-CM

## 2011-07-19 DIAGNOSIS — M869 Osteomyelitis, unspecified: Secondary | ICD-10-CM

## 2011-07-19 MED ORDER — HYDRALAZINE HCL 100 MG PO TABS: 100.0000 mg | ORAL_TABLET | Freq: Three times a day (TID) | ORAL | Status: DC

## 2011-07-19 MED ORDER — CARVEDILOL 6.25 MG PO TABS: 6.2500 mg | ORAL_TABLET | Freq: Two times a day (BID) | ORAL | Status: DC

## 2011-07-19 MED ORDER — GLUCOSE BLOOD VI STRP: ORAL_STRIP | Status: DC

## 2011-07-19 MED ORDER — AMOXICILLIN-POT CLAVULANATE 875-125 MG PO TABS: 1.0000 | ORAL_TABLET | Freq: Two times a day (BID) | ORAL | Status: DC

## 2011-07-19 MED ORDER — ACCU-CHEK AVIVA PLUS W/DEVICE KIT: 1.0000 | PACK | Freq: Two times a day (BID) | Status: DC

## 2011-07-19 NOTE — Assessment & Plan Note (Signed)
His left foot osteomyelitis is improving. At his visit last week his sedimentation rate had gone down from 117-81 and his C. reactive protein had gone down from 4.6-0.9. Since his wound is not fully healed I will continue Augmentin for at least 3 more weeks.

## 2011-07-19 NOTE — Patient Instructions (Signed)
I refilled your carvedilol  I am increasing you hydralazine to 100mg  three times a day Last A1c was 7.4 so I do not think we need to go to insulin yet. Keep watching your diet.  I want to see you again in 2-3 months.  Lab Results  Component Value Date   HGBA1C 7.4 06/20/2011

## 2011-07-19 NOTE — Progress Notes (Signed)
  Subjective:    Patient ID: [name], male    DOB: 11/01/1951, 60 y.o.   MRN: [id number]  HPI [name] is in for his followup visit. He is now completed just over 3 weeks of Augmentin for his left foot osteomyelitis. He states there is one tiny area of the wound it is not fully healed but he has only minimal drainage. He feels like he is doing better.    Review of Systems     Objective:   Physical Exam  Constitutional:       He is walking with the assistance of a cane.  Musculoskeletal:       His foot is in an Unna boot dressing to the level of his knees so I did not remove it for an exam.          Assessment & Plan:

## 2011-07-20 NOTE — Assessment & Plan Note (Signed)
Still not at goal, increase hydralazine to 100mg  three times daily and will see how pt does, pt to return if not tolerating well.  Would have pt follow up sooner but has multiple specialist appointments coming up so will see pt when he has time. Gave red flags to look out for.

## 2011-07-20 NOTE — Assessment & Plan Note (Signed)
Supposed to be having bypass but would like clearance by cardiologist first, pt has appointment.  Does appear stable at moment.

## 2011-07-20 NOTE — Progress Notes (Signed)
  Subjective:    Patient ID: [name], male    DOB: Mar 15, 1951, 60 y.o.   MRN: [id number]  Hypertension  Diabetes    Pt is back here for follow up on DM and HTN  1. Hypertension Blood pressure at home: SBP in the 170-180 range (which is improved).  Blood pressure today: 184/84 recheck 158/84 Taking Meds:started on carvidolol unable to increase due to low HR of 58 now. Started on hydralazine 50 but only minimal improvement. Side effects: been feeling good but has been having some AM headaches but less frequent. ROS: Denies visual changes nausea, vomiting, chest pain or abdominal pain or shortness of breath.   Diabetes:  High at home:200's Low at home:111 Taking medications: including lantus Side effects:no improvement in A1c been limiting the carbohydrates. ROS: denies fever, chills, dizziness, loss of conscieness, polyuria polydipsia numbness or tingling in extremities or chest pain.  Pt is being treated for wound on left foot had surgery to remove osteo of fifth ray.  Pt has not seen the cardiologist yet but has an appointment coming up. Pt was also likely to have bypass surgery on his right leg for PVD but needs clearance as well.   Review of Systems  Denies fever, chills, nausea vomiting abdominal pain, dysuria, chest pain, shortness of breath dyspnea on exertion or numbness in extremities Past medical history, social, surgical and family history all reviewed. See problem list.      Objective:   Physical Exam     General appearance: alert and cooperative Throat: lips, mucosa, and tongue normal; teeth and gums normal Lungs: clear to auscultation bilaterally Heart: regular rate and rhythm, S1, S2 normal, no murmur, click, rub or gallop Abdomen: soft, non-tender; bowel sounds normal; no masses,  no organomegaly Extremities: venous stasis dermatitis noted pt has ace bandage wrapped but sensation intact.     Assessment & Plan:

## 2011-07-20 NOTE — Assessment & Plan Note (Signed)
Discussed weight loss, discussed food choices, decline nutrition, will have pt come back when he is done with the specialists.

## 2011-07-20 NOTE — Assessment & Plan Note (Signed)
Pt seems to be doing better, is watching his diet somewhat, continue current regimen for now A1c was 7.4 so no need to start insulin yet.

## 2011-07-29 NOTE — Discharge Summary (Signed)
[name], [name]               ACCOUNT NO.:  [id number]  MEDICAL RECORD NO.:  [id number]  LOCATION:  [id number]                         FACILITY:  [facility]  PHYSICIAN:  Jessy Oto, M.D.   DATE OF BIRTH:  1951/07/21  DATE OF ADMISSION:  06/24/2011 DATE OF DISCHARGE:  06/30/2011                              DISCHARGE SUMMARY   ADMISSION DIAGNOSES: 1. 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 2. Diabetes mellitus. 3. Hypertension 4. Chronic kidney disease. 5. Peripheral vascular disease. 6. Venous insufficiency. 7. Hyperlipidemia. 8. Morbid obesity. 9. Chronic cystitis. 10.Nephrolithiasis.  DISCHARGE DIAGNOSES: 1. 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 2. Diabetes mellitus. 3. Hypertension 4. Chronic kidney disease. 5. Peripheral vascular disease. 6. Venous insufficiency. 7. Hyperlipidemia. 8. Morbid obesity. 9. Chronic cystitis. 10.Nephrolithiasis.  PROCEDURE:  On June 24, 2011, the patient underwent left fifth ray excision with closure of wound over drains and packing of two neurotrophic ulcers with normal saline wet to dry dressing performed by Dr. Louanne Skye, assisted by [name], [facility] under the ankle block.  BRIEF HISTORY:  The patient is a 60 year old male with multiple comorbidities including venous stasis disease of both lower extremities and poorly-controlled diabetes mellitus.  He has been treated in the past for right foot ulcers with debridement of the right fifth metatarsophalangeal joint.  He eventually went on to heal after undergoing bypass grafting improving the circulation to the right foot. Over the past several months, the patient has been followed at [facility]  for a left fifth metatarsophalangeal joint ulcer as well as an ulcer over the proximal metatarsal of the left fifth metatarsal. He was seen in Dr. Otho Ket office for further treatment options.  His radiographs demonstrated a osteolysis associated with a left fifth metatarsophalangeal joint consistent with osteomyelitis.  It was felt that a ray resection was indicated.  The patient was admitted for this procedure.  BRIEF HOSPITAL COURSE:  The patient tolerated the procedure without complications.  Postoperatively, he was treated with Unna boot to the left lower extremity to help with edematous changes.  He was on vancomycin and Zosyn per pharmacy protocol.  He underwent Unna boot changes by the ortho tech without incident.  Dressing changes to the left foot showed the skin edges to be viable and granulation tissue present.  No purulence noted.  Final culture and sensitivity resulted in no growth.  ID consult was obtained to assist with his antibiotic needs. The patient was seen by Dr. Megan Salon of Infectious Disease and his IV antibiotics were discontinued.  He was placed on Augmentin orally.  He remained on Augmentin throughout the remainder of the hospital stay. Once his wound had stabilized somewhat and had less drainage, he was started with physical therapy for ambulation and gait training. However, he was allowed nonweightbearing to the left lower extremity. Being that he had a painful right knee, this gave him  significant difficulty with the weightbearing status for the left.  The case manager assisted in durable medical equipment and eventually the patient obtained a wheelchair and he was able to do bed to wheelchair transfers, to keep the weight off his left foot.  He was followed by the [facility] to assist with his diabetic management as well as his hypertensive management.  Medications were adjusted for his diabetes and he eventually was placed on Januvia with  better control of his diabetes. Blood pressure medications were also adjusted leading to better control of his hypertension.  The patient eventually was stable for discharge to his home with arrangements made for the home health nurse to provide wet to dry dressing changes to the left foot.  He also received a durable medical equipment including a wheelchair.  He was afebrile and vital signs were stable at the time of discharge on June 30, 2011.  Other pertinent laboratory values:  CBC on admission WBC 12.4, hemoglobin 10.8, hematocrit 30.7, WBC dropped to 11.1 with hemoglobin 10.4, hematocrit 30.6.  The patient's sed rate was 117 on June 28, 2011. Blood glucose level is ranging from 162-101.  BUN on admission 28, creatinine 1.91, and at discharge BUN 20 with creatinine 2.0, albumin 2.9.  C-reactive protein 4.6.  Urinalysis negative for urinary tract infection.  Cultures from the wound of the left foot, no growth.  PLAN:  The patient was discharged to his home.  He was instructed to keep his wound dry and clean at all times.  Home health nurse will assist with dressing changes, which will be wet to dry saline-soaked gauze to the left lateral foot.  He will continue on a low-carb modified diet with no concentrated sweets.  He will continue to be nonweightbearing on the left lower extremity and will use a walker for ambulation and will do bed to chair transfers to a wheelchair to maintain his nonweightbearing status while at home.  He will follow up with Dr. Louanne Skye in 1 week.  The patient was given a prescription for amoxicillin 500 mg 1 p.o. b.i.d. He will resume all other medications including amlodipine, aspirin, Coreg, furosemide, glipizide, hydralazine, hydrocodone, ibuprofen, and simvastatin per his usual routine.  He will follow up with his primary care physician as directed. All questions encouraged and answered.  CONDITION ON DISCHARGE:  Stable.    Epimenio Foot,  P.A.   ______________________________ Jessy Oto, M.D.   SMV/MEDQ  D:  07/26/2011  T:  07/26/2011  Job:  [id number]  Electronically Signed by Gonzella Lex. on 07/27/2011 01:02:06 PM Electronically Signed by Basil Dess M.D. on 07/29/2011 10:36:03 AM

## 2011-08-09 ENCOUNTER — Ambulatory Visit: Payer: Medicare Other | Admitting: Internal Medicine

## 2011-09-08 ENCOUNTER — Telehealth: Payer: Self-pay | Admitting: Family Medicine

## 2011-09-08 NOTE — Telephone Encounter (Signed)
Would like to speak to Dr. Tamala Julian about setting up Cardiac testing for him.

## 2011-09-08 NOTE — Telephone Encounter (Signed)
Phone call completed Rodel Glaspy, Salome Spotted

## 2011-09-08 NOTE — Telephone Encounter (Signed)
[name] is returning [name]'s call.

## 2011-09-08 NOTE — Telephone Encounter (Signed)
appt made Fleeger, Salome Spotted

## 2011-09-12 LAB — POCT I-STAT, CHEM 8
Creatinine, Ser: 1.8 — ABNORMAL HIGH
Glucose, Bld: 165 — ABNORMAL HIGH
Hemoglobin: 12.9 — ABNORMAL LOW
TCO2: 27

## 2011-09-13 LAB — CBC
HCT: 37 % — ABNORMAL LOW (ref 39.0–52.0)
MCHC: 33.1 g/dL (ref 30.0–36.0)
MCV: 83.6 fL (ref 78.0–100.0)
Platelets: 176 10*3/uL (ref 150–400)
RBC: 4.43 MIL/uL (ref 4.22–5.81)

## 2011-09-13 LAB — PROTIME-INR: Prothrombin Time: 13.5 seconds (ref 11.6–15.2)

## 2011-09-13 LAB — GLUCOSE, CAPILLARY: Glucose-Capillary: 248 mg/dL — ABNORMAL HIGH (ref 70–99)

## 2011-09-13 LAB — TISSUE HYBRIDIZATION (BONE MARROW)-NCBH

## 2011-09-13 LAB — BONE MARROW EXAM

## 2011-09-21 ENCOUNTER — Encounter (HOSPITAL_COMMUNITY)
Admission: RE | Admit: 2011-09-21 | Discharge: 2011-09-21 | Disposition: A | Discharge status: Home / Self Care | Payer: Medicare Other | Source: Ambulatory Visit | Attending: Specialist | Admitting: Specialist

## 2011-09-21 LAB — CBC
HCT: 32.2 % — ABNORMAL LOW (ref 39.0–52.0)
MCHC: 33.9 g/dL (ref 30.0–36.0)
Platelets: 180 10*3/uL (ref 150–400)
RDW: 14.4 % (ref 11.5–15.5)
WBC: 10.2 10*3/uL (ref 4.0–10.5)

## 2011-09-21 LAB — SURGICAL PCR SCREEN
MRSA, PCR: NEGATIVE
Staphylococcus aureus: NEGATIVE

## 2011-09-21 LAB — DIFFERENTIAL
Basophils Absolute: 0 10*3/uL (ref 0.0–0.1)
Basophils Relative: 0 % (ref 0–1)
Eosinophils Relative: 3 % (ref 0–5)
Lymphocytes Relative: 20 % (ref 12–46)

## 2011-09-21 LAB — BASIC METABOLIC PANEL
CO2: 27 mEq/L (ref 19–32)
Calcium: 9.2 mg/dL (ref 8.4–10.5)
Creatinine, Ser: 2.35 mg/dL — ABNORMAL HIGH (ref 0.50–1.35)
GFR calc Af Amer: 32 mL/min — ABNORMAL LOW (ref >90)
GFR calc non Af Amer: 28 mL/min — ABNORMAL LOW (ref >90)
Sodium: 138 mEq/L (ref 135–145)

## 2011-09-21 LAB — PROTIME-INR
INR: 1.01 (ref 0.00–1.49)
Prothrombin Time: 13.5 seconds (ref 11.6–15.2)

## 2011-09-23 ENCOUNTER — Ambulatory Visit (HOSPITAL_COMMUNITY)
Admission: RE | Admit: 2011-09-23 | Discharge: 2011-09-25 | Disposition: A | Discharge status: Home / Self Care | Payer: Medicare Other | Source: Ambulatory Visit | Attending: Specialist | Admitting: Specialist

## 2011-09-23 DIAGNOSIS — N189 Chronic kidney disease, unspecified: Secondary | ICD-10-CM | POA: Insufficient documentation

## 2011-09-23 DIAGNOSIS — I2510 Atherosclerotic heart disease of native coronary artery without angina pectoris: Secondary | ICD-10-CM | POA: Insufficient documentation

## 2011-09-23 DIAGNOSIS — M23329 Other meniscus derangements, posterior horn of medial meniscus, unspecified knee: Secondary | ICD-10-CM | POA: Insufficient documentation

## 2011-09-23 DIAGNOSIS — M2240 Chondromalacia patellae, unspecified knee: Secondary | ICD-10-CM | POA: Insufficient documentation

## 2011-09-23 DIAGNOSIS — E119 Type 2 diabetes mellitus without complications: Secondary | ICD-10-CM | POA: Insufficient documentation

## 2011-09-23 DIAGNOSIS — Z01812 Encounter for preprocedural laboratory examination: Secondary | ICD-10-CM | POA: Insufficient documentation

## 2011-09-23 DIAGNOSIS — S83259A Bucket-handle tear of lateral meniscus, current injury, unspecified knee, initial encounter: Principal | ICD-10-CM | POA: Insufficient documentation

## 2011-09-23 DIAGNOSIS — Z9861 Coronary angioplasty status: Secondary | ICD-10-CM | POA: Insufficient documentation

## 2011-09-23 DIAGNOSIS — E669 Obesity, unspecified: Secondary | ICD-10-CM | POA: Insufficient documentation

## 2011-09-23 DIAGNOSIS — I129 Hypertensive chronic kidney disease with stage 1 through stage 4 chronic kidney disease, or unspecified chronic kidney disease: Secondary | ICD-10-CM | POA: Insufficient documentation

## 2011-09-24 LAB — GLUCOSE, CAPILLARY: Glucose-Capillary: 240 mg/dL — ABNORMAL HIGH (ref 70–99)

## 2011-09-25 LAB — GLUCOSE, CAPILLARY: Glucose-Capillary: 207 mg/dL — ABNORMAL HIGH (ref 70–99)

## 2011-09-26 NOTE — Op Note (Signed)
[name], [name]               ACCOUNT NO.:  [id number]  MEDICAL RECORD NO.:  [id number]  LOCATION:  5007                         FACILITY:  [facility]  PHYSICIAN:  Jessy Oto, M.D.   DATE OF BIRTH:  February 11, 1951  DATE OF PROCEDURE:  09/23/2011 DATE OF DISCHARGE:                              OPERATIVE REPORT   PREOPERATIVE DIAGNOSIS:  Torn lateral meniscus, right knee; bucket handle, flipped anteriorly.  POSTOPERATIVE DIAGNOSIS:  Torn lateral meniscus, right knee; bucket handle, flipped anteriorly.  Some minor fraying of the posterior horn attachment of medial meniscus.  PROCEDURE:  Right knee arthroscopy with partial lateral meniscectomy involving the entire posterior horn of the lateral meniscus and minimal partial meniscectomy of the medial meniscus in the posterior horn of areas of the small frayed edges of the meniscus were debrided.  The patient does have a grade 3 and grade 4 chondromalacia changes of the tibial plateau involving the lateral joint line greater than medial joint line.  The distal femoral condyles also was some areas of grade 2 and 3 chondromalacia changes of the medial femoral condyle greater than lateral.  SURGEON:  Basil Dess, MD.  ASSISTANT:  Epimenio Foot PA-C.  ANESTHESIA:  General via orotracheal intubation by Dr. Glennon Mac.  ESTIMATED BLOOD LOSS:  10 mL.  The patient had general anesthetic supplemented with local infiltration of Marcaine 0.5% with 1:200,000 epinephrine a total of 18 mL used to infiltrate the skin over both the medial, lateral, inferior and anterior portal sites.  HISTORY OF PRESENT ILLNESS:  This patient is a 60 year old male with a history of diabetes type 2.  He is on oral medications.  He does have significant peripheral neuropathy and has undergone previous metatarsal phalangeal joint resection of the 5th toe.  This past summer, he developed mechanical symptoms of the right knee with recurrent swelling and  discomfort.  X-rays with only very mild or minimal degenerative changes of patellofemoral medial joint line.  Because of his diabetes and venous stasis disease in both lower extremities and neurotrophic ulcers, MRI scan was obtained, documented the necessity for arthroscopy if necessary.  An MRI scan did show a bucket-handle tear of the right lateral meniscus.  His exam was consistent with pain on full extension and on full flexion.  The patient had then developed problems of osteomyelitis of the left foot 5th metatarsal and eventually underwent left 5th metatarsal ray resection.  This has been doing well with antibiotics and wound is completely healed.  Does have bilateral venous stasis disease and is undergoing Unna boot applications, as the last Unna boot applied on the right lower extremity was almost a week ago. As he has improved to the point where his lower extremities no longer have wounds, he is advised to go ahead and have arthroscopic debridement of the torn meniscus.  Nearly 6-7 months since the initial presentation with knee pain.  INTRAOPERATIVE FINDINGS:  A torn right lateral meniscus involving the posterior half of the lateral meniscus.  The meniscus was loosened and bucket-handle and flipped anteriorly.  Very large size meniscus, required a great deal of debridement and required a great deal of time to free up and resect arthroscopically.  The patient is morbidly obese.  DESCRIPTION OF PROCEDURE:  This patient was seen in the preoperative holding area.  Discussion regarding his surgery, all questions were answered.  He has been informed of the risks and benefits of the procedure including risks of infection, bleeding, neurologic compromise, expected meniscus will be resected and this should be able to be accomplished with 99% accuracy.  Expect diminished discomfort and mechanical pain in the right knee with resection of torn meniscus.  The patient then had marking of  the right knee with my initials.  He then had a transport to the OR room #4 at [facility] was used for the procedure.  Standard preoperative antibiotics of 2 g of Ancef.  The patient was transferred to the OR table and underwent induction of anesthesia and intubation using a oral esophageal obturator.  He tolerated this well, was atraumatic.  Then he had prep of the right lower extremity from the right upper thigh to the right ankle with DuraPrep solution after 1st scrubbed with Betadine scrub.  Scrubbing brushes were total of 5 minutes.  Then DuraPrep was applied to ankle and upper thigh.  Tourniquet was about the right upper thigh.  The right leg holder was used and table dropped for evaluation.  With DuraPrep then the right leg was draped in usual manner.  Gravity drainage was used and 80 mm of mercury inflow was used.  The patient had initial stab incision after localization of the joint line and infiltration of skin and subcu layers over the lateral joint line, the anterior-inferior-lateral portal region as well as the anterior-inferior-medial portal region with Marcaine and epinephrine and after this, a stab incision was made into the lateral joint level.  The sleeve of the trocar for the arthroscopy equipment was then passed into the knee joint and I examined the medial joint line, where small amount of tear of the posterior horn at the medial meniscus was noted.  The medial portal incision was then made 1st using a spinal needle for localization and then an 11-blade scalpel using the stab incisions and then subcu layers spread using a blunt trocar into the knee joint.  The probe was then passed and probing of the posterior horn of the meniscus was carried out.  This demonstrated some fraying and radial-like appearance of the inner fibers of the meniscus appeared torn so the basket was then passed carefully use debride a small amount of the frayed edge of the posterior horn  of medial meniscus then this was shaved with a 3.5 full radius shaver. This was completed and evaluation of the ACL and PCL was normal. Evaluation of the lateral joint line demonstrated joint line to be filled with a meniscal material and extending into the intercondylar notch medially, it was meniscal material.  This was a very large torn meniscus with bucket-handle appearance.  Initial cutting into the area of the posterior attachment was performed using scissors until much of it was freed and then attempts on grasping of the meniscus and pulling and removing it as a single large fragment were totally unsuccessful, so that a basket was then used to carefully debride further the torn meniscus in its bucket-handle position until the meniscus had been freed enough to allow for its reduction back into the posterior aspect of the knee joint.  Once this was completed, then the posterior horn corner was able to be further debrided and then finely detached.  This was an extremely large bucket-handle tear, quite old.  The posterior attachment was then grasped with the meniscus grabber and then carefully pulled on and almost third of the meniscus was able to be resected this way and then the residual meniscus that was still free and was unstable and was carefully examined using a probe.  This involved the posterior and lateral rim of the lateral meniscus.  This could be easily brought into the joint so that with time then we were able to carefully used baskets to debride the attachment of the lateral aspect of the meniscus and its bucket handle attachment to the more normal appearing meniscus anterolaterally.  Again this was done using straight baskets until the fragment that represented the lateral and posterior lateral aspects of the meniscus was free of the joint and carefully this was grasped. Attempts made to deliver this through the medial portal were unsuccessful.  Exploration of the joint  was then necessary using arthroscope to found a meniscal fragment within the medial recess.  This was then carefully freed up and grasped using the meniscal grasper and then replaced in the lateral joint line where again it was grasped using a meniscal grasper and then delivered into the medial portal region. After further attempts to remove the meniscus, I was able to partially pull through the skin and with several other clamps attached to it then I was able to be delivered through the medial-inferior-anterior portal of the knee.  Meniscal fragment measuring about a 1.5 cm x 1 cm thickness.  With this resected then the rim of the anterior aspect of the lateral meniscus was then carefully shaved using a full-radius shaver back to more normal appearance.  Irrigation was carried out of the knee joint.  The patient then had the remaining irrigant expressed from the knee joint and all cannulas were removed.  Single 4-0 Vicryl suture was used to close the subcu layers and then 4-0 nylon suture was used to close the 2 portal incisions.  Adaptic, 4x4s, ABD, pad fixed to the skin with sterile Kerlix and then a well-padded right lower extremity.  Jones dressing was applied from the right foot to the right upper thigh.  The patient was then reactivated, extubated, returned to recovery room in satisfactory condition.  All instruments and sponge counts were correct.  PHYSICIAN ASSISTANT'S RESPONSIBILITY:  [name] performed duties of assistant surgeon in this case.  She assisted with the movement and carrying of the right leg, positioning during surgery to allow for a favorable evaluation of both the medial and lateral joint lines.  The patient's leg was quite large due to his obesity so the additional assistance was necessary.  At the end of the case, she performed closure of the incisions and application of dressing.  POSTOPERATIVE CARE:  The patient will be kept overnight due to  the length of the procedure, which was roughly 2 0.5 hours.  He will be then discharged in the a.m. home to be seen back in the office in 2 weeks. Vicodin extra strength, #40, 1 every 4-6 hours p.r.n. pain.     Jessy Oto, M.D.     JEN/MEDQ  D:  09/23/2011  T:  09/23/2011  Job:  [id number]  Electronically Signed by Basil Dess M.D. on 09/26/2011 11:39:41 AM

## 2011-09-29 LAB — I-STAT 8, (EC8 V) (CONVERTED LAB)
Chloride: 109
Glucose, Bld: 128 — ABNORMAL HIGH
HCT: 40
Potassium: 4
Sodium: 141
TCO2: 27
pH, Ven: 7.365 — ABNORMAL HIGH

## 2011-09-30 ENCOUNTER — Ambulatory Visit (INDEPENDENT_AMBULATORY_CARE_PROVIDER_SITE_OTHER): Payer: Medicare Other | Admitting: Family Medicine

## 2011-09-30 ENCOUNTER — Encounter: Payer: Self-pay | Admitting: Family Medicine

## 2011-09-30 VITALS — BP 160/72 | HR 78 | Temp 98.5°F | Ht 71.0 in | Wt 320.0 lb

## 2011-09-30 DIAGNOSIS — I10 Essential (primary) hypertension: Secondary | ICD-10-CM

## 2011-09-30 DIAGNOSIS — E119 Type 2 diabetes mellitus without complications: Secondary | ICD-10-CM

## 2011-09-30 DIAGNOSIS — E118 Type 2 diabetes mellitus with unspecified complications: Secondary | ICD-10-CM

## 2011-09-30 MED ORDER — GLIPIZIDE 10 MG PO TABS: 20.0000 mg | ORAL_TABLET | Freq: Two times a day (BID) | ORAL | Status: DC

## 2011-09-30 MED ORDER — CARVEDILOL 12.5 MG PO TABS: 12.5000 mg | ORAL_TABLET | Freq: Two times a day (BID) | ORAL | Status: DC

## 2011-09-30 MED ORDER — GLUCOSE BLOOD VI STRP: ORAL_STRIP | Status: AC

## 2011-09-30 NOTE — Assessment & Plan Note (Signed)
Patient still not well-controlled Lab Results  Component Value Date   HGBA1C 8.6 09/30/2011   we'll have patient increase his glipizide to 20 mg twice a day unable to increase Januvia do to chronic kidney disease unable to do metformin for same reason. We'll recheck A1c in 3 months if still not under 8 would need to consider starting insulin

## 2011-09-30 NOTE — Progress Notes (Signed)
  Subjective:    Patient ID: [name], male    DOB: 1950-12-26, 60 y.o.   MRN: [id number]  HPI 1. Hypertension Blood pressure at home: Not checking Blood pressure today: 160/70 on recheck Taking Meds: Yes patient was increased to carvedilol 12.5 mg twice a day Side effects: No ROS: Denies headache visual changes nausea, vomiting, chest pain or abdominal pain or shortness of breath.   Diabetes:  High at home: Has not checked out of strips Low at home: As above Taking medications: Yes Side effects: No ROS: denies fever, chills, dizziness, loss of conscieness, polyuria poly dipsia numbness or tingling in extremities or chest pain.  Patient had recent knee arthroscopy done in inpatient setting patient patient is going to be following up with Dr. Merrilee Seashore 10 next week patient has been able to ambulate with the aid of a crutch on that same right side he has also had a fifth ray metatarsal amputation done this was back in 2007 patient though seems to be doing very well states he still has a little pain but it is much better  Chronic kidney disease: Patient follows up with [facility] is on Lasix otherwise avoiding renal toxic drugs including ACEs Lab Results  Component Value Date   CREATININE 2.35* 09/21/2011   Patient's baseline seems to be around 2.0  Review of Systems Denies fever, chills, nausea vomiting abdominal pain, dysuria, chest pain, shortness of breath dyspnea on exertion or numbness in extremities Past medical history, social, surgical and family history all reviewed. See problem list.     Objective:   Physical Exam General appearance: alert and cooperative Overweight male Throat: lips, mucosa, and tongue normal; teeth and gums normal Lungs: clear to auscultation bilaterally Heart: regular rate and rhythm, S1, S2 normal, no murmur, click, rub or gallop Abdomen: soft, non-tender; bowel sounds normal; no masses,  no organomegaly Extremities: venous stasis  dermatitis noted pt has ace bandage wrapped but sensation intact.    Assessment & Plan:

## 2011-09-30 NOTE — Assessment & Plan Note (Signed)
Patient still not at goal would like tighter control patient though has not started his increasing carvedilol at this point will go up to 12.5 mg twice a day and monitor him we'll have to watch patient's heart rate is still not controlled and have room I would increase him to 25 mg otherwise any consider doing clonidine patch

## 2011-09-30 NOTE — Patient Instructions (Addendum)
Good to see you Start increasing your activity Keep watching your diet and log your blood sugars and and blood pressure Lets have you come back in 6 weeks.

## 2011-09-30 NOTE — Assessment & Plan Note (Signed)
Wt Readings from Last 3 Encounters:  09/30/11 320 lb (145.151 kg)  07/19/11 318 lb (144.244 kg)  07/19/11 320 lb 12 oz (145.491 kg)   discussed with patient importance of losing weight especially on his cardiovascular renal diabetes and hypertension. Patient's did just recently have knee surgery hopefully with physical therapy he will do better in with a decreased amount pain he'll be able to increase his activity level patient's goal weight at next visit in 6 weeks 310 pounds

## 2011-10-31 ENCOUNTER — Encounter (HOSPITAL_BASED_OUTPATIENT_CLINIC_OR_DEPARTMENT_OTHER): Payer: Medicare Other | Attending: Internal Medicine

## 2011-10-31 DIAGNOSIS — I739 Peripheral vascular disease, unspecified: Secondary | ICD-10-CM | POA: Insufficient documentation

## 2011-10-31 DIAGNOSIS — Z79899 Other long term (current) drug therapy: Secondary | ICD-10-CM | POA: Insufficient documentation

## 2011-10-31 DIAGNOSIS — I890 Lymphedema, not elsewhere classified: Secondary | ICD-10-CM | POA: Insufficient documentation

## 2011-10-31 DIAGNOSIS — E119 Type 2 diabetes mellitus without complications: Secondary | ICD-10-CM | POA: Insufficient documentation

## 2011-10-31 DIAGNOSIS — I10 Essential (primary) hypertension: Secondary | ICD-10-CM | POA: Insufficient documentation

## 2011-10-31 DIAGNOSIS — Z7982 Long term (current) use of aspirin: Secondary | ICD-10-CM | POA: Insufficient documentation

## 2011-10-31 NOTE — H&P (Signed)
  NAMEETAN, CORIELL               ACCOUNT NO.:  [id number]  MEDICAL RECORD NO.:  [id number]           PATIENT TYPE:  LOCATION:                                 FACILITY:  PHYSICIAN:  Judene Companion, M.D.     DATE OF BIRTH:  01-Jan-1951  DATE OF ADMISSION: DATE OF DISCHARGE:                             HISTORY & PHYSICAL   He comes back for the first time in several months.  He is a 60 year old morbidly obese man who has marked lymphedema and this has been a recurrent problem for years.  He comes back now with superficial ulcers on his bilateral lower legs.  He has got elephant-type skin.  He is 60 years of age and has a blood pressure of 190/100.  He is on several medications for his hypertension including Norvasc, carvedilol, Lasix, simvastatin.  He is also on Glucotrol for his type 2 diabetes and Actos. He was treated today with Unna boots with alginate on the wounds and we made him an appointment [facility] for treatment.  He will come back here in a week.     Judene Companion, M.D.     PP/MEDQ  D:  11/17/2010  T:  11/18/2010  Job:  [id number]  Electronically Signed by Judene Companion  on 10/31/2011 01:36:39 PM

## 2011-11-01 NOTE — H&P (Signed)
NAME:  CHANCIE, PENNYWELL                    ACCOUNT NO.:  MEDICAL RECORD NO.:  [id number]  LOCATION:                                 FACILITY:  PHYSICIAN:  Judene Companion, M.D.     DATE OF BIRTH:  Nov 16, 1951  DATE OF ADMISSION: DATE OF DISCHARGE:                             HISTORY & PHYSICAL   He is a 60 year old, morbidly obese gentleman.  He has been a patient here many times before for venous stasis ulcers.  He is a diabetic with hypertension and he is on many medicines including aspirin, Glucotrol, Norvasc, Actos, carvedilol, Lasix, simvastatin, Januvia, and he also has lymphedema pumps at home.  This gentleman has had a left fem-pop bypass and has been re-evaluated by the vascular surgeon, and he feels that the bypass is intact and all of his problem is from venous stasis.  He has got marked lymphedema with bilateral blisters of fluid that is all running from his calves.  I do not see any infection, but he has just very severe bilateral lymphedema with running fluid out of the superficial blisters.  We treated him today by wrapping him in [employer] and an Haematologist, and Silvercel, and he is supposed to use the lymphedema pumps around the [place] boots, 2 hours a day, and stay off his feet, and he will be back here in a week and we will check him again at that time.  His vital signs were okay.  He was afebrile and he states that his sugar was 150, so we have the problem with a very obese man with lymphedema, hypertension, diabetes, and peripheral vascular disease.     Judene Companion, M.D.     PP/MEDQ  D:  10/31/2011  T:  10/31/2011  Job:  [id number]

## 2011-11-08 ENCOUNTER — Ambulatory Visit: Payer: Medicare Other | Admitting: Family Medicine

## 2011-11-14 ENCOUNTER — Encounter (HOSPITAL_BASED_OUTPATIENT_CLINIC_OR_DEPARTMENT_OTHER): Payer: Medicare Other | Attending: Internal Medicine

## 2011-11-14 ENCOUNTER — Ambulatory Visit (INDEPENDENT_AMBULATORY_CARE_PROVIDER_SITE_OTHER): Payer: Medicare Other | Admitting: Family Medicine

## 2011-11-14 DIAGNOSIS — Z79899 Other long term (current) drug therapy: Secondary | ICD-10-CM | POA: Insufficient documentation

## 2011-11-14 DIAGNOSIS — E118 Type 2 diabetes mellitus with unspecified complications: Secondary | ICD-10-CM

## 2011-11-14 DIAGNOSIS — I872 Venous insufficiency (chronic) (peripheral): Secondary | ICD-10-CM | POA: Insufficient documentation

## 2011-11-14 DIAGNOSIS — I10 Essential (primary) hypertension: Secondary | ICD-10-CM | POA: Insufficient documentation

## 2011-11-14 DIAGNOSIS — E1165 Type 2 diabetes mellitus with hyperglycemia: Secondary | ICD-10-CM

## 2011-11-14 DIAGNOSIS — I890 Lymphedema, not elsewhere classified: Secondary | ICD-10-CM | POA: Insufficient documentation

## 2011-11-14 DIAGNOSIS — E119 Type 2 diabetes mellitus without complications: Secondary | ICD-10-CM | POA: Insufficient documentation

## 2011-11-14 DIAGNOSIS — I739 Peripheral vascular disease, unspecified: Secondary | ICD-10-CM | POA: Insufficient documentation

## 2011-11-14 DIAGNOSIS — Z7982 Long term (current) use of aspirin: Secondary | ICD-10-CM | POA: Insufficient documentation

## 2011-11-14 NOTE — Progress Notes (Signed)
  Subjective:    Patient ID: [name], male    DOB: March 16, 1951, 60 y.o.   MRN: [id number]  HPI  1. Hypertension Blood pressure at home: Not checking Blood pressure today: 138/66 Taking Meds: Yes Side effects: No ROS: Denies headache visual changes nausea, vomiting, chest pain or abdominal pain or shortness of breath.   Diabetes:  High at home: still has some 300's Low at home: mid 100's Taking medications: Yes Side effects: No ROS: denies fever, chills, dizziness, loss of conscieness, polyuria poly dipsia numbness or tingling in extremities or chest pain.  Pt has also started dating a new GF and has been having a lot of fun and who is having a lot more control of his diet and motivated to make changes.   Review of Systems  Denies fever, chills, nausea vomiting abdominal pain, dysuria, chest pain, shortness of breath dyspnea on exertion or numbness in extremities Past medical history, social, surgical and family history all reviewed. See problem list.     Objective:   Physical Exam  General appearance: alert and cooperative Overweight male Lungs: clear to auscultation bilaterally Heart: regular rate and rhythm, S1, S2 normal, no murmur, click, rub or gallop Abdomen: soft, non-tender; bowel sounds normal; no masses,  no organomegaly Extremities: venous stasis dermatitis noted pt has ace bandage wrapped but sensation intact.    Assessment & Plan:

## 2011-11-14 NOTE — Assessment & Plan Note (Signed)
Pt has shown a little improvement, but still concern, will get A1c in 2 months if elevated or still not getting better with lifestyle changes will sta

## 2011-11-14 NOTE — Patient Instructions (Addendum)
Your blood pressure is much improved. He should be proud of that. Keep watching her sugars. I'm giving you some information on food choices. I want to see you back in 2 months and we will get an A1c. If it is still elevated we will consider doing insulin at that time. Happy holidays and happy new year.  Diets for Diabetes, Food Labeling Look at food labels to help you decide how much of a product you can eat. You will want to check the amount of total carbohydrate in a serving to see how the food fits into your meal plan. In the list of ingredients, the ingredient present in the largest amount by weight must be listed first, followed by the other ingredients in descending order. STANDARD OF IDENTITY Most products have a list of ingredients. However, foods that the Food and Drug Administration (FDA) has given a standard of identity do not need a list of ingredients. A standard of identity means that a food must contain certain ingredients if it is called a particular name. Examples are mayonnaise, peanut butter, ketchup, jelly, and cheese. LABELING TERMS There are many terms found on food labels. Some of these terms have specific definitions. Some terms are regulated by the FDA, and the FDA has clearly specified how they can be used. Others are not regulated or well-defined and can be misleading and confusing. SPECIFICALLY DEFINED TERMS Nutritive Sweetener.  A sweetener that contains calories,such as table sugar or honey.  Nonnutritive Sweetener.  A sweetener with few or no calories,such as saccharin, aspartame, sucralose, and cyclamate.  LABELING TERMS REGULATED BY THE FDA Free.  The product contains only a tiny or small amount of fat, cholesterol, sodium, sugar, or calories. For example, a "fat-free" product will contain less than 0.5 g of fat per serving.  Low.  A food described as "low" in fat, saturated fat, cholesterol, sodium, or calories could be eaten fairly often without exceeding  dietary guidelines. For example, "low in fat" means no more than 3 g of fat per serving.  Lean.  "Lean" and "extra lean" are U.S. Department of Agriculture Scientist, research (physical sciences)) terms for use on meat and poultry products. "Lean" means the product contains less than 10 g of fat, 4 g of saturated fat, and 95 mg of cholesterol per serving. "Lean" is not as low in fat as a product labeled "low."  Extra Lean.  "Extra lean" means the product contains less than 5 g of fat, 2 g of saturated fat, and 95 mg of cholesterol per serving. While "extra lean" has less fat than "lean," it is still higher in fat than a product labeled "low."  Reduced, Less, Fewer.  A diet product that contains 25% less of a nutrient or calories than the regular version. For example, hot dogs might be labeled "25% less fat than our regular hot dogs."  Light/Lite.  A diet product that contains ? fewer calories or  the fat of the original. For example, "light in sodium" means a product with  the usual sodium.  More.  One serving contains at least 10% more of the daily value of a vitamin, mineral, or fiber than usual.  Good Source Of.  One serving contains 10% to 19% of the daily value for a particular vitamin, mineral, or fiber.  Excellent Source Of.  One serving contains 20% or more of the daily value for a particular nutrient. Other terms used might be "high in" or "rich in."  Enriched or Fortified.  The product  contains added vitamins, minerals, or protein. Nutrition labeling must be used on enriched or fortified foods.  Imitation.  The product has been altered so that it is lower in protein, vitamins, or minerals than the usual food,such as imitation peanut butter.  Total Fat.  The number listed is the total of all fat found in a serving of the product. Under total fat, food labels must list saturated fat and trans fat, which are associated with raising bad cholesterol and an increased risk of heart blood vessel disease.  Saturated  Fat.  Mainly fats from animal-based sources. Some examples are red meat, cheese, cream, whole milk, and coconut oil.  Trans Fat.  Found in some fried snack foods, packaged foods, and fried restaurant foods. It is recommended you eat as close to 0 g of trans fat as possible, since it raises bad cholesterol and lowers good cholesterol.  Polyunsaturated and Monounsaturated Fats.  More healthful fats. These fats are from plant sources.  Total Carbohydrate.  The number of carbohydrate grams in a serving of the product. Under total carbohydrate are listed the other carbohydrate sources, such as dietary fiber and sugars.  Dietary Fiber.  A carbohydrate from plant sources.  Sugars.  Sugars listed on the label contain all naturally occurring sugars as well as added sugars.  LABELING TERMS NOT REGULATED BY THE FDA Sugarless.  Table sugar (sucrose) has not been added. However, the manufacturer may use another form of sugar in place of sucrose to sweeten the product. For example, sugar alcohols are used to sweeten foods. Sugar alcohols are a form of sugar but are not table sugar. If a product contains sugar alcohols in place of sucrose, it can still be labeled "sugarless."  Low Salt, Salt-Free, Unsalted, No Salt, No Salt Added, Without Added Salt.  Food that is usually processed with salt has been made without salt. However, the food may contain sodium-containing additives, such as preservatives, leavening agents, or flavorings.  Natural.  This term has no legal meaning.  Organic.  Foods that are certified as organic have been inspected and approved by the USDA to ensure they are produced without pesticides, fertilizers containing synthetic ingredients, bioengineering, or ionizing radiation.  Document Released: 12/01/2003 Document Revised: 08/10/2011 Document Reviewed: 06/18/2009 [facility] Patient Information 2012 [place].

## 2011-11-14 NOTE — Assessment & Plan Note (Signed)
Improved at this time and at goal will continue to monitor. No changes coming back in 2 months.

## 2011-11-28 ENCOUNTER — Encounter (HOSPITAL_BASED_OUTPATIENT_CLINIC_OR_DEPARTMENT_OTHER): Payer: Medicare Other

## 2011-12-19 ENCOUNTER — Encounter (HOSPITAL_BASED_OUTPATIENT_CLINIC_OR_DEPARTMENT_OTHER): Payer: Medicare Other | Attending: Internal Medicine

## 2011-12-19 DIAGNOSIS — L97809 Non-pressure chronic ulcer of other part of unspecified lower leg with unspecified severity: Secondary | ICD-10-CM | POA: Insufficient documentation

## 2011-12-19 DIAGNOSIS — I890 Lymphedema, not elsewhere classified: Secondary | ICD-10-CM | POA: Insufficient documentation

## 2011-12-27 ENCOUNTER — Ambulatory Visit: Payer: Medicare Other | Admitting: Family Medicine

## 2012-01-16 ENCOUNTER — Encounter (HOSPITAL_BASED_OUTPATIENT_CLINIC_OR_DEPARTMENT_OTHER): Payer: Medicare Other | Attending: Internal Medicine

## 2012-01-16 DIAGNOSIS — I890 Lymphedema, not elsewhere classified: Secondary | ICD-10-CM | POA: Insufficient documentation

## 2012-01-16 DIAGNOSIS — L97809 Non-pressure chronic ulcer of other part of unspecified lower leg with unspecified severity: Secondary | ICD-10-CM | POA: Insufficient documentation

## 2012-02-03 ENCOUNTER — Encounter (HOSPITAL_BASED_OUTPATIENT_CLINIC_OR_DEPARTMENT_OTHER): Payer: Medicare Other | Attending: Internal Medicine

## 2012-02-10 ENCOUNTER — Other Ambulatory Visit: Payer: Self-pay | Admitting: Family Medicine

## 2012-02-10 NOTE — Telephone Encounter (Signed)
Refill request

## 2012-02-23 ENCOUNTER — Other Ambulatory Visit: Payer: Self-pay | Admitting: Family Medicine

## 2012-02-23 NOTE — Telephone Encounter (Signed)
Refilled. Please notify patient.

## 2012-02-23 NOTE — Telephone Encounter (Signed)
Refill request

## 2012-03-30 ENCOUNTER — Encounter (HOSPITAL_BASED_OUTPATIENT_CLINIC_OR_DEPARTMENT_OTHER): Payer: Medicare Other | Attending: General Surgery

## 2012-03-30 DIAGNOSIS — L97809 Non-pressure chronic ulcer of other part of unspecified lower leg with unspecified severity: Secondary | ICD-10-CM | POA: Insufficient documentation

## 2012-03-30 DIAGNOSIS — Z794 Long term (current) use of insulin: Secondary | ICD-10-CM | POA: Insufficient documentation

## 2012-03-30 DIAGNOSIS — E1169 Type 2 diabetes mellitus with other specified complication: Secondary | ICD-10-CM | POA: Insufficient documentation

## 2012-03-30 DIAGNOSIS — Z79899 Other long term (current) drug therapy: Secondary | ICD-10-CM | POA: Insufficient documentation

## 2012-03-30 DIAGNOSIS — L97509 Non-pressure chronic ulcer of other part of unspecified foot with unspecified severity: Secondary | ICD-10-CM | POA: Insufficient documentation

## 2012-03-30 DIAGNOSIS — K219 Gastro-esophageal reflux disease without esophagitis: Secondary | ICD-10-CM | POA: Insufficient documentation

## 2012-04-13 ENCOUNTER — Encounter (HOSPITAL_BASED_OUTPATIENT_CLINIC_OR_DEPARTMENT_OTHER): Payer: Medicare Other | Attending: General Surgery

## 2012-04-13 DIAGNOSIS — L97309 Non-pressure chronic ulcer of unspecified ankle with unspecified severity: Secondary | ICD-10-CM | POA: Insufficient documentation

## 2012-04-13 DIAGNOSIS — H9210 Otorrhea, unspecified ear: Secondary | ICD-10-CM | POA: Insufficient documentation

## 2012-04-27 ENCOUNTER — Encounter (HOSPITAL_BASED_OUTPATIENT_CLINIC_OR_DEPARTMENT_OTHER): Payer: Medicare Other

## 2012-05-18 ENCOUNTER — Encounter (HOSPITAL_BASED_OUTPATIENT_CLINIC_OR_DEPARTMENT_OTHER): Payer: Medicare Other | Attending: General Surgery

## 2012-05-18 DIAGNOSIS — L97809 Non-pressure chronic ulcer of other part of unspecified lower leg with unspecified severity: Secondary | ICD-10-CM | POA: Insufficient documentation

## 2012-05-18 DIAGNOSIS — Z79899 Other long term (current) drug therapy: Secondary | ICD-10-CM | POA: Insufficient documentation

## 2012-05-18 DIAGNOSIS — I872 Venous insufficiency (chronic) (peripheral): Secondary | ICD-10-CM | POA: Insufficient documentation

## 2012-05-18 DIAGNOSIS — E1169 Type 2 diabetes mellitus with other specified complication: Secondary | ICD-10-CM | POA: Insufficient documentation

## 2012-05-18 DIAGNOSIS — L97509 Non-pressure chronic ulcer of other part of unspecified foot with unspecified severity: Secondary | ICD-10-CM | POA: Insufficient documentation

## 2012-06-06 ENCOUNTER — Other Ambulatory Visit: Payer: Self-pay | Admitting: Family Medicine

## 2012-06-15 ENCOUNTER — Encounter (HOSPITAL_BASED_OUTPATIENT_CLINIC_OR_DEPARTMENT_OTHER): Payer: Medicare Other | Attending: General Surgery

## 2012-06-15 DIAGNOSIS — Z79899 Other long term (current) drug therapy: Secondary | ICD-10-CM | POA: Insufficient documentation

## 2012-06-15 DIAGNOSIS — I872 Venous insufficiency (chronic) (peripheral): Secondary | ICD-10-CM | POA: Insufficient documentation

## 2012-06-15 DIAGNOSIS — E1169 Type 2 diabetes mellitus with other specified complication: Secondary | ICD-10-CM | POA: Insufficient documentation

## 2012-06-15 DIAGNOSIS — L97509 Non-pressure chronic ulcer of other part of unspecified foot with unspecified severity: Secondary | ICD-10-CM | POA: Insufficient documentation

## 2012-06-15 DIAGNOSIS — L97809 Non-pressure chronic ulcer of other part of unspecified lower leg with unspecified severity: Secondary | ICD-10-CM | POA: Insufficient documentation

## 2012-06-29 ENCOUNTER — Other Ambulatory Visit: Payer: Self-pay | Admitting: Family Medicine

## 2012-07-27 ENCOUNTER — Encounter (HOSPITAL_BASED_OUTPATIENT_CLINIC_OR_DEPARTMENT_OTHER): Payer: Medicare Other | Attending: General Surgery

## 2012-07-27 DIAGNOSIS — I872 Venous insufficiency (chronic) (peripheral): Secondary | ICD-10-CM | POA: Insufficient documentation

## 2012-07-27 DIAGNOSIS — L97209 Non-pressure chronic ulcer of unspecified calf with unspecified severity: Secondary | ICD-10-CM | POA: Insufficient documentation

## 2012-07-27 DIAGNOSIS — L97809 Non-pressure chronic ulcer of other part of unspecified lower leg with unspecified severity: Secondary | ICD-10-CM | POA: Insufficient documentation

## 2012-08-16 ENCOUNTER — Encounter: Payer: Self-pay | Admitting: Family Medicine

## 2012-08-16 ENCOUNTER — Ambulatory Visit (INDEPENDENT_AMBULATORY_CARE_PROVIDER_SITE_OTHER): Payer: Medicare Other | Admitting: Family Medicine

## 2012-08-16 VITALS — BP 186/78 | HR 67 | Ht 71.0 in | Wt 297.0 lb

## 2012-08-16 DIAGNOSIS — E785 Hyperlipidemia, unspecified: Secondary | ICD-10-CM

## 2012-08-16 DIAGNOSIS — M25569 Pain in unspecified knee: Secondary | ICD-10-CM

## 2012-08-16 DIAGNOSIS — E1165 Type 2 diabetes mellitus with hyperglycemia: Secondary | ICD-10-CM

## 2012-08-16 DIAGNOSIS — R2240 Localized swelling, mass and lump, unspecified lower limb: Secondary | ICD-10-CM

## 2012-08-16 DIAGNOSIS — I10 Essential (primary) hypertension: Secondary | ICD-10-CM

## 2012-08-16 DIAGNOSIS — E118 Type 2 diabetes mellitus with unspecified complications: Secondary | ICD-10-CM

## 2012-08-16 DIAGNOSIS — R229 Localized swelling, mass and lump, unspecified: Secondary | ICD-10-CM

## 2012-08-16 LAB — POCT GLYCOSYLATED HEMOGLOBIN (HGB A1C): Hemoglobin A1C: 6.4

## 2012-08-16 NOTE — Patient Instructions (Addendum)
Thank you for coming in today Your knee pain is from osteoarthritis. Please consider either having a joint injection or taking pain medications Please do not take any NSAIDs (advil, motrin, ibuprofen, aleve) Please come back to see me in 2 weeks for a follow up for your knee pain.  Wear and Tear Disorders of the Knee (Arthritis, Osteoarthritis) Everyone will experience wear and tear injuries (arthritis, osteoarthritis) of the knee. These are the changes we all get as we age. They come from the joint stress of daily living. The amount of cartilage damage in your knee and your symptoms determine if you need surgery. Mild problems require approximately two months recovery time. More severe problems take several months to recover. With mild problems, your surgeon may find worn and rough cartilage surfaces. With severe changes, your surgeon may find cartilage that has completely worn away and exposed the bone. Loose bodies of bone and cartilage, bone spurs (excess bone growth), and injuries to the menisci (cushions between the large bones of your leg) are also common. All of these problems can cause pain. For a mild wear and tear problem, rough cartilage may simply need to be shaved and smoothed. For more severe problems with areas of exposed bone, your surgeon may use an instrument for roughing up the bone surfaces to stimulate new cartilage growth. Loose bodies are usually removed. Torn menisci may be trimmed or repaired. ABOUT THE ARTHROSCOPIC PROCEDURE Arthroscopy is a surgical technique. It allows your orthopedic surgeon to diagnose and treat your knee injury with accuracy. The surgeon looks into your knee through a small scope. The scope is like a small (pencil-sized) telescope. Arthroscopy is less invasive than open knee surgery. You can expect a more rapid recovery. After the procedure, you will be moved to a recovery area until most of the effects of the medication have worn off. Your caregiver will  discuss the test results with you. RECOVERY The severity of the arthritis and the type of procedure performed will determine recovery time. Other important factors include age, physical condition, medical conditions, and the type of rehabilitation program. Strengthening your muscles after arthroscopy helps guarantee a better recovery. Follow your caregiver's instructions. Use crutches, rest, elevate, ice, and do knee exercises as instructed. Your caregivers will help you and instruct you with exercises and other physical therapy required to regain your mobility, muscle strength, and functioning following surgery. Only take over-the-counter or prescription medicines for pain, discomfort, or fever as directed by your caregiver.  SEEK MEDICAL CARE IF:   There is increased bleeding (more than a small spot) from the wound.   You notice redness, swelling, or increasing pain in the wound.   Pus is coming from wound.   You develop an unexplained oral temperature above 102 F (38.9 C) , or as your caregiver suggests.   You notice a foul smell coming from the wound or dressing.   You have severe pain with motion of the knee.  SEEK IMMEDIATE MEDICAL CARE IF:   You develop a rash.   You have difficulty breathing.   You have any allergic problems.  MAKE SURE YOU:   Understand these instructions.   Will watch your condition.   Will get help right away if you are not doing well or get worse.  Document Released: 11/25/2000 Document Revised: 11/17/2011 Document Reviewed: 04/23/2008 [facility] Patient Information 2012 [place].

## 2012-08-17 ENCOUNTER — Encounter (HOSPITAL_BASED_OUTPATIENT_CLINIC_OR_DEPARTMENT_OTHER): Payer: Medicare Other | Attending: General Surgery

## 2012-08-17 DIAGNOSIS — L97809 Non-pressure chronic ulcer of other part of unspecified lower leg with unspecified severity: Secondary | ICD-10-CM | POA: Insufficient documentation

## 2012-08-17 LAB — LIPID PANEL
Cholesterol: 180 mg/dL (ref 0–200)
HDL: 29 mg/dL — ABNORMAL LOW (ref >39)
Total CHOL/HDL Ratio: 6.2 Ratio

## 2012-08-17 LAB — COMPLETE METABOLIC PANEL WITH GFR
Alkaline Phosphatase: 67 U/L (ref 39–117)
BUN: 24 mg/dL — ABNORMAL HIGH (ref 6–23)
CO2: 23 mEq/L (ref 19–32)
GFR, Est African American: 36 mL/min — ABNORMAL LOW
GFR, Est Non African American: 31 mL/min — ABNORMAL LOW
Glucose, Bld: 120 mg/dL — ABNORMAL HIGH (ref 70–99)
Total Bilirubin: 0.3 mg/dL (ref 0.3–1.2)
Total Protein: 7 g/dL (ref 6.0–8.3)

## 2012-08-21 ENCOUNTER — Encounter: Payer: Self-pay | Admitting: Family Medicine

## 2012-08-21 DIAGNOSIS — M25569 Pain in unspecified knee: Secondary | ICD-10-CM | POA: Insufficient documentation

## 2012-08-21 DIAGNOSIS — R2240 Localized swelling, mass and lump, unspecified lower limb: Secondary | ICD-10-CM | POA: Insufficient documentation

## 2012-08-21 NOTE — Assessment & Plan Note (Signed)
Lipid panel prior to next appt

## 2012-08-21 NOTE — Assessment & Plan Note (Addendum)
Osteoarthritis. Pt to pick up pain meds from ORtho office. Recommended knee injection. Pt to return in 2 wks. No NSAIDs due to CKD

## 2012-08-21 NOTE — Assessment & Plan Note (Signed)
Cr elevated when last checked w/o known f/u per pt. Will recheck

## 2012-08-21 NOTE — Progress Notes (Signed)
  Subjective:    Patient ID: [name], male    DOB: 10-20-1951, 61 y.o.   MRN: [id number]  HPI CC: R knee pain, R thigh mass  R knee pain: Chronic condition. Torn meniscus in past w/ orthoscopic surgery. Acute worsening on SUnday w/ decreased mobility. Worsened w/ movement especially weight bearing. Tylenol 500 w/o relief. Denies effusion, erythema, fever  T htigh mass: Present for 4-5 mo. Nonpainful or erythematous. Previous surgery to that area. No h/o malignancy. Deneis wt loss, fever, night sweats.   HTN: elevated today. Compliant w/ medications. Difficulty ambulating in from hot parking lot on painful knee and bp taken immediately after sitting. Denies HA, palpitations, syncope, CP, SOB  HLD: Taking zocor as prescribed, ROS as above  PMHx and PSx noted above and reviewed  Review of Systems Per hpi    Objective:   Physical Exam  Gen: Obese, elderly CV: RRR, no m/r./g Musc: Obese so difficult to examine, R knee crepitus. Patella normal position and w/o laxity, no effusion of R knee. R thigh w/ surgical scar and stiff subcutaneous tissue that is slightly larger than L thigh. No lynphadenopathy.  Skin: Intact.       Assessment & Plan:

## 2012-08-21 NOTE — Assessment & Plan Note (Signed)
Likely elevated from knee pain and exertion on hot day. Recheck BP. Return in 2 wks

## 2012-08-21 NOTE — Assessment & Plan Note (Signed)
A1c 6.4. No change in therapy

## 2012-08-21 NOTE — Assessment & Plan Note (Signed)
Likely benign soft tissue density w/ scar tissue and [place] tissue. Will monitor for any changes

## 2012-08-31 ENCOUNTER — Encounter (HOSPITAL_BASED_OUTPATIENT_CLINIC_OR_DEPARTMENT_OTHER): Payer: Medicare Other

## 2012-09-20 ENCOUNTER — Other Ambulatory Visit: Payer: Self-pay | Admitting: *Deleted

## 2012-09-20 ENCOUNTER — Encounter: Payer: Self-pay | Admitting: Family Medicine

## 2012-09-20 DIAGNOSIS — I10 Essential (primary) hypertension: Secondary | ICD-10-CM

## 2012-09-20 MED ORDER — CARVEDILOL 12.5 MG PO TABS: 12.5000 mg | ORAL_TABLET | Freq: Two times a day (BID) | ORAL | Status: DC

## 2012-09-27 ENCOUNTER — Telehealth: Payer: Self-pay | Admitting: Family Medicine

## 2012-09-27 DIAGNOSIS — E118 Type 2 diabetes mellitus with unspecified complications: Secondary | ICD-10-CM

## 2012-09-27 DIAGNOSIS — E785 Hyperlipidemia, unspecified: Secondary | ICD-10-CM

## 2012-09-27 DIAGNOSIS — I10 Essential (primary) hypertension: Secondary | ICD-10-CM

## 2012-09-27 NOTE — Telephone Encounter (Signed)
Pt is returning call to Dr Marily Memos - I did not give message due to nature of message.  Will wait for call back from nurse of doctor - he is in class from 10:30-12:30 and cannot answer during that time - also his voice mail is not working.

## 2012-09-27 NOTE — Telephone Encounter (Signed)
Called and left a voicemail for pt to call back Pt needs tighter lipid control and would recommend changing to lipitor from zocor Also needs referral to Renal due to CKD stage 3

## 2012-09-29 ENCOUNTER — Other Ambulatory Visit: Payer: Self-pay | Admitting: Family Medicine

## 2012-10-01 ENCOUNTER — Encounter (HOSPITAL_BASED_OUTPATIENT_CLINIC_OR_DEPARTMENT_OTHER): Payer: Medicare Other | Attending: General Surgery

## 2012-10-01 DIAGNOSIS — E119 Type 2 diabetes mellitus without complications: Secondary | ICD-10-CM | POA: Insufficient documentation

## 2012-10-01 DIAGNOSIS — I872 Venous insufficiency (chronic) (peripheral): Secondary | ICD-10-CM | POA: Insufficient documentation

## 2012-10-01 DIAGNOSIS — Z79899 Other long term (current) drug therapy: Secondary | ICD-10-CM | POA: Insufficient documentation

## 2012-10-01 DIAGNOSIS — S98139A Complete traumatic amputation of one unspecified lesser toe, initial encounter: Secondary | ICD-10-CM | POA: Insufficient documentation

## 2012-10-01 DIAGNOSIS — L97809 Non-pressure chronic ulcer of other part of unspecified lower leg with unspecified severity: Secondary | ICD-10-CM | POA: Insufficient documentation

## 2012-10-01 DIAGNOSIS — C9000 Multiple myeloma not having achieved remission: Secondary | ICD-10-CM | POA: Insufficient documentation

## 2012-10-01 NOTE — Progress Notes (Signed)
[facility]  NAME:  [name], [name]               ACCOUNT NO.:  [id number]  MEDICAL RECORD NO.:  [id number]      DATE OF BIRTH:  1951/11/09  PHYSICIAN:  Judene Companion, M.D.           VISIT DATE:                                  OFFICE VISIT   [name] is an obese 61 year old African American man who is a type 2 diabetes patient.  He has also had multiple problems with venous stasis and with arterial occlusive disease.  He has had an amputation of his left 5th toe before osteomyelitis of the toe.  He also has a diagnosis of multiple myeloma.  He is morbidly obese, weighs over 300 pounds and has a severe skin changes of venous stasis with ulcers.  He healed these up pretty well a couple of months ago, but he has recurrence on the right side.  His medicines include hydralazine, glipizide, amlodipine, Coreg, Lasix, simvastatin and Januvia .  On examination today, he had small ulcers on the anterior aspect of his right leg that were multiple.  He was treated with an Haematologist and we also put some Silvercel on the wounds.  He will come back in a week and we will change this again.  He is also on home pumps using these at home for years, but apparently one of his pumps is broken and he has not used it.  He said he had some number he could call to perhaps get it repaired.  So he is going to check on the repair so that he can resume lymphedema pumps.     Judene Companion, M.D.     PP/MEDQ  D:  10/01/2012  T:  10/01/2012  Job:  [id number]

## 2012-10-02 ENCOUNTER — Other Ambulatory Visit: Payer: Self-pay | Admitting: *Deleted

## 2012-10-02 DIAGNOSIS — E1165 Type 2 diabetes mellitus with hyperglycemia: Secondary | ICD-10-CM

## 2012-10-02 MED ORDER — SIMVASTATIN 80 MG PO TABS: 80.0000 mg | ORAL_TABLET | Freq: Every day | ORAL | Status: DC

## 2012-10-02 MED ORDER — GLIPIZIDE 10 MG PO TABS: 20.0000 mg | ORAL_TABLET | Freq: Two times a day (BID) | ORAL | Status: DC

## 2012-10-02 NOTE — Assessment & Plan Note (Signed)
Pt deferring changing simvastatin at this time. Aware of risks. Will refill SImvastatin

## 2012-10-02 NOTE — Addendum Note (Signed)
Addended by: Marily Memos, Lazaro Isenhower J on: 10/02/2012 05:30 PM   Modules accepted: Orders

## 2012-10-02 NOTE — Assessment & Plan Note (Signed)
Pt states BP is improved and w/o HA since making changes from last appt

## 2012-10-02 NOTE — Assessment & Plan Note (Signed)
Pt deferred new referral to renal.  Will retutrn  To previous nephrologist

## 2012-10-15 ENCOUNTER — Encounter (HOSPITAL_BASED_OUTPATIENT_CLINIC_OR_DEPARTMENT_OTHER): Payer: Medicare Other | Attending: General Surgery

## 2012-10-15 DIAGNOSIS — I87319 Chronic venous hypertension (idiopathic) with ulcer of unspecified lower extremity: Secondary | ICD-10-CM | POA: Insufficient documentation

## 2012-10-15 DIAGNOSIS — L97909 Non-pressure chronic ulcer of unspecified part of unspecified lower leg with unspecified severity: Secondary | ICD-10-CM | POA: Insufficient documentation

## 2012-10-23 DIAGNOSIS — E113599 Type 2 diabetes mellitus with proliferative diabetic retinopathy without macular edema, unspecified eye: Secondary | ICD-10-CM | POA: Insufficient documentation

## 2012-10-29 ENCOUNTER — Encounter (HOSPITAL_BASED_OUTPATIENT_CLINIC_OR_DEPARTMENT_OTHER): Payer: Medicare Other

## 2012-11-01 ENCOUNTER — Encounter: Payer: Self-pay | Admitting: Vascular Surgery

## 2012-11-21 ENCOUNTER — Encounter (HOSPITAL_BASED_OUTPATIENT_CLINIC_OR_DEPARTMENT_OTHER): Payer: Medicare Other | Attending: General Surgery

## 2012-11-21 DIAGNOSIS — I87319 Chronic venous hypertension (idiopathic) with ulcer of unspecified lower extremity: Secondary | ICD-10-CM | POA: Insufficient documentation

## 2012-11-21 DIAGNOSIS — I890 Lymphedema, not elsewhere classified: Secondary | ICD-10-CM | POA: Insufficient documentation

## 2012-11-21 DIAGNOSIS — L97909 Non-pressure chronic ulcer of unspecified part of unspecified lower leg with unspecified severity: Secondary | ICD-10-CM | POA: Insufficient documentation

## 2012-11-22 ENCOUNTER — Ambulatory Visit (INDEPENDENT_AMBULATORY_CARE_PROVIDER_SITE_OTHER): Payer: Medicare Other | Admitting: Family Medicine

## 2012-11-22 ENCOUNTER — Encounter: Payer: Self-pay | Admitting: Family Medicine

## 2012-11-22 VITALS — BP 166/70 | HR 57 | Temp 97.8°F | Ht 71.0 in | Wt 305.5 lb

## 2012-11-22 DIAGNOSIS — A088 Other specified intestinal infections: Secondary | ICD-10-CM

## 2012-11-22 DIAGNOSIS — E1165 Type 2 diabetes mellitus with hyperglycemia: Secondary | ICD-10-CM

## 2012-11-22 DIAGNOSIS — A084 Viral intestinal infection, unspecified: Secondary | ICD-10-CM | POA: Insufficient documentation

## 2012-11-22 DIAGNOSIS — E118 Type 2 diabetes mellitus with unspecified complications: Secondary | ICD-10-CM

## 2012-11-22 LAB — POCT GLYCOSYLATED HEMOGLOBIN (HGB A1C): Hemoglobin A1C: 6.9

## 2012-11-22 LAB — GLUCOSE, CAPILLARY: Glucose-Capillary: 84 mg/dL (ref 70–99)

## 2012-11-22 NOTE — Assessment & Plan Note (Addendum)
Appears to be resolving at present. Believe dizziness likely orthostatic given patient likely dehydrated with increased BM vs. Vasovagal given associations with BM. Fortunately, has completely resolved. Would pursue further workup including cardiac if returned. Advised patient to stay well hydrated, get rest. RTC if not continuing to improve or if fevers.

## 2012-11-22 NOTE — Patient Instructions (Addendum)
Follow up early next week if you do not continue to improve or if you start with fevers above 101.   Viral Gastroenteritis Viral gastroenteritis is also known as stomach flu. This condition affects the stomach and intestinal tract. It can cause sudden diarrhea and vomiting. The illness typically lasts 3 to 8 days. Most people develop an immune response that eventually gets rid of the virus. While this natural response develops, the virus can make you quite ill. CAUSES  Many different viruses can cause gastroenteritis, such as rotavirus or noroviruses. You can catch one of these viruses by consuming contaminated food or water. You may also catch a virus by sharing utensils or other personal items with an infected person or by touching a contaminated surface. SYMPTOMS  The most common symptoms are diarrhea and vomiting. These problems can cause a severe loss of body fluids (dehydration) and a body salt (electrolyte) imbalance. Other symptoms may include:  Fever.  Headache.  Fatigue.  Abdominal pain.  diarrhea DIAGNOSIS  Your caregiver can usually diagnose viral gastroenteritis based on your symptoms and a physical exam. A stool sample may also be taken to test for the presence of viruses or other infections. TREATMENT  This illness typically goes away on its own. Treatments are aimed at rehydration. The most serious cases of viral gastroenteritis involve vomiting so severely that you are not able to keep fluids down. In these cases, fluids must be given through an intravenous line (IV). HOME CARE INSTRUCTIONS   Drink enough fluids to keep your urine clear or pale yellow. Drink small amounts of fluids frequently and increase the amounts as tolerated.  Ask your caregiver for specific rehydration instructions.  Avoid:  Foods high in sugar.  Alcohol.  Carbonated drinks.  Tobacco.  Juice.  Caffeine drinks.  Extremely hot or cold fluids.  Fatty, greasy foods.  Too much intake of  anything at one time.  Dairy products until 24 to 48 hours after diarrhea stops.  You may consume probiotics. Probiotics are active cultures of beneficial bacteria. They may lessen the amount and number of diarrheal stools in adults. Probiotics can be found in yogurt with active cultures and in supplements.  Wash your hands well to avoid spreading the virus.  Only take over-the-counter or prescription medicines for pain, discomfort, or fever as directed by your caregiver. Do not give aspirin to children. Antidiarrheal medicines are not recommended.  Ask your caregiver if you should continue to take your regular prescribed and over-the-counter medicines.  Keep all follow-up appointments as directed by your caregiver. SEEK IMMEDIATE MEDICAL CARE IF:   You are unable to keep fluids down.  You do not urinate at least once every 6 to 8 hours.  You develop shortness of breath.  You notice blood in your stool or vomit. This may look like coffee grounds.  You have abdominal pain that increases or is concentrated in one small area (localized).  You have persistent vomiting or diarrhea.  You have a fever.  The patient is a child younger than 3 months, and he or she has a fever.  The patient is a child older than 3 months, and he or she has a fever and persistent symptoms.  The patient is a child older than 3 months, and he or she has a fever and symptoms suddenly get worse.  The patient is a baby, and he or she has no tears when crying. MAKE SURE YOU:   Understand these instructions.  Will watch your condition.  Will get help right away if you are not doing well or get worse. Document Released: 11/28/2005 Document Revised: 02/20/2012 Document Reviewed: 09/14/2011 [facility] Patient Information 2013 [place].

## 2012-11-22 NOTE — Progress Notes (Signed)
Subjective:   1. Loose stools/dizziness. Patient states he has had a very slight headache for about 1-2 weeks. Patient states his stomach has not felt quite right for 1-2 weeks as well mainly perceived as increased frequency of stools. Think she may have eaten something funny. Starting approximately 1st of the month he started having BM twice a day which were looser than normal as well (usually goes 1-2x per week and firm stool). No recent changes in eating habits. No blood or melena. Denies nausea/vomiting/fever/fatigue. Does think he had an occasional chill over the last week but admits its been cold outside. During this time he has felt intermittently dizzy typically worse around times he has bowel movements. Patient states his girlfriend made him some soup on Sunday and he drank plenty of fluids and dizziness, headaches have resolved. He only had 1 BM yesterday and none today. Does not know of any specific sick contacts but states he is in school and could have easily run into someone that was sick.    ROS--See HPI with addition-patient does not complain of any low or high blood sugars during this period. CBG today 84.  Past Medical History- Patient Active Problem List  Diagnosis  . ONYCHOMYCOSIS  . DIABETES MELLITUS, II, COMPLICATIONS  . Hyperlipidemia LDL goal < 100  . OBESITY, MORBID  . HYPERTENSION, BENIGN SYSTEMIC  . PERIPHERAL VASCULAR DISEASE, UNSPEC.  [name] LYMPHEDEMA, SEVERE  . VENOUS INSUFFICIENCY, LEGS  . UMBILICAL HERNIA  . CHRONIC KIDNEY DISEASE STAGE III (MODERATE)  . NEPHROLITHIASIS  . CYSTITIS, CHRONIC  . ORGANIC IMPOTENCE  . CELLULITIS, FINGER  . DYSTROPHIC NAILS  . OSTEOMYELITIS  . CAROTID BRUIT, LEFT  . NEPHROLITHIASIS, HX OF  . Left fifth fay ex  . Knee pain  . Mass of thigh   Reviewed problem list.  Medications- reviewed and updated Chief complaint-noted  Objective: BP 166/70  Pulse 57  Temp 97.8 F (36.6 C) (Oral)  Ht 5\' 11"  (1.803 m)  Wt 305 lb 8 oz  (138.574 kg)  BMI 42.61 kg/m2 Gen: NAD, resting comfortably CV: RRR no mrg Lungs: CTAB Abd: soft, mild RUQ tenderness to deep palpation, nondistended, normal bowel sounds. Obese. Large midline hernia above umbilicus noted easily reducible at bottom. Rest appears consistent with diastasis rectus.  Skin: warm, dry  Assessment/Plan: See problem oriented charted

## 2012-12-13 ENCOUNTER — Encounter (HOSPITAL_BASED_OUTPATIENT_CLINIC_OR_DEPARTMENT_OTHER): Payer: Medicare Other | Attending: Internal Medicine

## 2012-12-13 DIAGNOSIS — L97809 Non-pressure chronic ulcer of other part of unspecified lower leg with unspecified severity: Secondary | ICD-10-CM | POA: Insufficient documentation

## 2012-12-13 DIAGNOSIS — I872 Venous insufficiency (chronic) (peripheral): Secondary | ICD-10-CM | POA: Insufficient documentation

## 2012-12-19 ENCOUNTER — Encounter (HOSPITAL_BASED_OUTPATIENT_CLINIC_OR_DEPARTMENT_OTHER): Payer: Medicare Other

## 2013-01-04 ENCOUNTER — Ambulatory Visit (INDEPENDENT_AMBULATORY_CARE_PROVIDER_SITE_OTHER): Payer: Medicare Other | Admitting: Family Medicine

## 2013-01-04 ENCOUNTER — Encounter: Payer: Self-pay | Admitting: Family Medicine

## 2013-01-04 VITALS — BP 160/98 | Temp 98.2°F | Ht 71.0 in | Wt 301.2 lb

## 2013-01-04 DIAGNOSIS — I10 Essential (primary) hypertension: Secondary | ICD-10-CM

## 2013-01-04 MED ORDER — AMLODIPINE BESYLATE 10 MG PO TABS: 10.0000 mg | ORAL_TABLET | Freq: Every day | ORAL | Status: DC

## 2013-01-04 MED ORDER — CARVEDILOL 12.5 MG PO TABS: 12.5000 mg | ORAL_TABLET | Freq: Two times a day (BID) | ORAL | Status: DC

## 2013-01-04 NOTE — Assessment & Plan Note (Addendum)
Uncontrolled hypertension but not significantly elevated over recent values in past year. His goal is lower than 140/85 given his CKD and DM2. We reviewed his last known regimen including hydralazine TID, coreg BID and norvasc. Screened negative for sleep apnea per history, suspect underlying cause essential HTN with volume CKD and obesity.  I refilled his medications and encouraged compliance, avoidance of salt, weight loss/exercise and f/u with PCP in 2 weeks for labs and DM2 follow up.

## 2013-01-04 NOTE — Progress Notes (Signed)
  Subjective:    Patient ID: [name], male    DOB: April 06, 1951, 62 y.o.   MRN: [id number]  HPI  1. Elevated BP. [facility] and they told him his BP was elevated to [id number] systolic. They advised he see his PCP, so he presents to acute clinic today. They used an electronic machine that was acting strangely and checked his right arm. He admits to not taking hydralazine TID and running out of norvasc one week ago. He thinks he's taking coreg 2-3 times daily.  BP Readings from Last 3 Encounters:  01/04/13 160/98  11/22/12 166/70  08/16/12 186/78   He is trying to lose weight but fluctuated between 320 and 297 in past year. Today is 301. He feels is stressed recently starting back classes at [employer].  He denies severe headaches, dyspnea, chest pain, visual decline, change in urination, snoring, excessive daytime fatigue. No regular alcohol use.  Review of Systems See HPI otherwise negative.  reports that he quit smoking about 33 years ago. He does not have any smokeless tobacco history on file.     Objective:   Physical Exam  Vitals reviewed. Constitutional: He is oriented to person, place, and time. He appears well-developed and well-nourished. No distress.  HENT:  Head: Normocephalic and atraumatic.  Nose: Nose normal.  Mouth/Throat: Oropharynx is clear and moist. No oropharyngeal exudate.  Eyes: EOM are normal.  Cardiovascular: Normal rate, regular rhythm and normal heart sounds.   No murmur heard. Pulmonary/Chest: Effort normal and breath sounds normal. No respiratory distress. He has no wheezes. He has no rales.  Neurological: He is alert and oriented to person, place, and time. No cranial nerve deficit. Coordination normal.  Skin: He is not diaphoretic.  Psychiatric: He has a normal mood and affect.        Assessment & Plan:

## 2013-01-04 NOTE — Patient Instructions (Addendum)
Nice to meet you. Your blood pressure may be higher off hydralazine, or because machine was wrong. Your goal blood pressure with kidney disease is less than 140/85. Work on exercise and weight loss. Even 15-20 lbs would help. Take hydralazine three times daily. Choose foods with protein and fiber. Lean meat, eggs. Make an appointment with Dr. Marily Memos in next 2 weeks for check up.  Body mass index is 42.01 kg/(m^2).

## 2013-01-18 ENCOUNTER — Ambulatory Visit: Payer: Medicare Other | Admitting: Family Medicine

## 2013-01-28 ENCOUNTER — Other Ambulatory Visit: Payer: Self-pay | Admitting: *Deleted

## 2013-01-29 MED ORDER — HYDRALAZINE HCL 100 MG PO TABS: ORAL_TABLET | ORAL | Status: DC

## 2013-02-01 ENCOUNTER — Encounter (HOSPITAL_BASED_OUTPATIENT_CLINIC_OR_DEPARTMENT_OTHER): Payer: Medicare Other | Attending: General Surgery

## 2013-02-01 DIAGNOSIS — L97909 Non-pressure chronic ulcer of unspecified part of unspecified lower leg with unspecified severity: Secondary | ICD-10-CM | POA: Insufficient documentation

## 2013-02-15 ENCOUNTER — Encounter (HOSPITAL_BASED_OUTPATIENT_CLINIC_OR_DEPARTMENT_OTHER): Payer: Medicare Other | Attending: General Surgery

## 2013-02-15 DIAGNOSIS — I87309 Chronic venous hypertension (idiopathic) without complications of unspecified lower extremity: Secondary | ICD-10-CM | POA: Insufficient documentation

## 2013-02-15 DIAGNOSIS — L97809 Non-pressure chronic ulcer of other part of unspecified lower leg with unspecified severity: Secondary | ICD-10-CM | POA: Insufficient documentation

## 2013-02-22 LAB — GLUCOSE, CAPILLARY: Glucose-Capillary: 147 mg/dL — ABNORMAL HIGH (ref 70–99)

## 2013-03-15 ENCOUNTER — Encounter (HOSPITAL_BASED_OUTPATIENT_CLINIC_OR_DEPARTMENT_OTHER): Payer: Medicare Other | Attending: General Surgery

## 2013-03-15 DIAGNOSIS — I890 Lymphedema, not elsewhere classified: Secondary | ICD-10-CM | POA: Insufficient documentation

## 2013-03-15 DIAGNOSIS — I872 Venous insufficiency (chronic) (peripheral): Secondary | ICD-10-CM | POA: Insufficient documentation

## 2013-03-15 DIAGNOSIS — L97909 Non-pressure chronic ulcer of unspecified part of unspecified lower leg with unspecified severity: Secondary | ICD-10-CM | POA: Insufficient documentation

## 2013-03-22 LAB — GLUCOSE, CAPILLARY: Glucose-Capillary: 90 mg/dL (ref 70–99)

## 2013-04-03 ENCOUNTER — Other Ambulatory Visit: Payer: Self-pay | Admitting: Family Medicine

## 2013-04-05 LAB — GLUCOSE, CAPILLARY

## 2013-04-12 ENCOUNTER — Encounter (HOSPITAL_BASED_OUTPATIENT_CLINIC_OR_DEPARTMENT_OTHER): Payer: Medicare Other | Attending: General Surgery

## 2013-04-12 DIAGNOSIS — L97809 Non-pressure chronic ulcer of other part of unspecified lower leg with unspecified severity: Secondary | ICD-10-CM | POA: Insufficient documentation

## 2013-04-12 DIAGNOSIS — I872 Venous insufficiency (chronic) (peripheral): Secondary | ICD-10-CM | POA: Insufficient documentation

## 2013-04-12 LAB — GLUCOSE, CAPILLARY

## 2013-04-24 ENCOUNTER — Other Ambulatory Visit: Payer: Self-pay | Admitting: Family Medicine

## 2013-05-24 ENCOUNTER — Encounter (HOSPITAL_BASED_OUTPATIENT_CLINIC_OR_DEPARTMENT_OTHER): Payer: Medicare Other | Attending: General Surgery

## 2013-05-24 DIAGNOSIS — Z79899 Other long term (current) drug therapy: Secondary | ICD-10-CM | POA: Insufficient documentation

## 2013-05-24 DIAGNOSIS — S98119A Complete traumatic amputation of unspecified great toe, initial encounter: Secondary | ICD-10-CM | POA: Insufficient documentation

## 2013-05-24 DIAGNOSIS — L97909 Non-pressure chronic ulcer of unspecified part of unspecified lower leg with unspecified severity: Secondary | ICD-10-CM | POA: Insufficient documentation

## 2013-05-24 DIAGNOSIS — E119 Type 2 diabetes mellitus without complications: Secondary | ICD-10-CM | POA: Insufficient documentation

## 2013-05-24 LAB — GLUCOSE, CAPILLARY: Glucose-Capillary: 140 mg/dL — ABNORMAL HIGH (ref 70–99)

## 2013-05-29 ENCOUNTER — Other Ambulatory Visit: Payer: Self-pay | Admitting: Family Medicine

## 2013-06-20 ENCOUNTER — Other Ambulatory Visit: Payer: Self-pay

## 2013-06-21 ENCOUNTER — Encounter (HOSPITAL_BASED_OUTPATIENT_CLINIC_OR_DEPARTMENT_OTHER): Payer: Medicare Other | Attending: General Surgery

## 2013-06-21 DIAGNOSIS — L97909 Non-pressure chronic ulcer of unspecified part of unspecified lower leg with unspecified severity: Secondary | ICD-10-CM | POA: Insufficient documentation

## 2013-07-09 ENCOUNTER — Encounter: Payer: Self-pay | Admitting: Family Medicine

## 2013-07-09 ENCOUNTER — Ambulatory Visit (INDEPENDENT_AMBULATORY_CARE_PROVIDER_SITE_OTHER): Payer: Medicare Other | Admitting: Family Medicine

## 2013-07-09 VITALS — BP 164/60 | HR 58 | Temp 98.2°F | Wt 303.0 lb

## 2013-07-09 DIAGNOSIS — Z1211 Encounter for screening for malignant neoplasm of colon: Secondary | ICD-10-CM

## 2013-07-09 DIAGNOSIS — I872 Venous insufficiency (chronic) (peripheral): Secondary | ICD-10-CM

## 2013-07-09 DIAGNOSIS — E1165 Type 2 diabetes mellitus with hyperglycemia: Secondary | ICD-10-CM

## 2013-07-09 DIAGNOSIS — E785 Hyperlipidemia, unspecified: Secondary | ICD-10-CM

## 2013-07-09 DIAGNOSIS — I10 Essential (primary) hypertension: Secondary | ICD-10-CM

## 2013-07-09 LAB — COMPREHENSIVE METABOLIC PANEL
AST: 10 U/L (ref 0–37)
Albumin: 3 g/dL — ABNORMAL LOW (ref 3.5–5.2)
Alkaline Phosphatase: 73 U/L (ref 39–117)
BUN: 34 mg/dL — ABNORMAL HIGH (ref 6–23)
Potassium: 4.2 mEq/L (ref 3.5–5.3)

## 2013-07-09 MED ORDER — CARVEDILOL 25 MG PO TABS: 25.0000 mg | ORAL_TABLET | Freq: Two times a day (BID) | ORAL | Status: DC

## 2013-07-09 NOTE — Assessment & Plan Note (Signed)
On simva Repeat lipid panel after 9/5 Likely to change cholesterol lowering medications at that time.

## 2013-07-09 NOTE — Assessment & Plan Note (Signed)
Colonoscopy scheduled.

## 2013-07-09 NOTE — Progress Notes (Signed)
[name] is a 62 y.o. male who presents to [facility] today for complete physical  DM: dieting and eating healthy. Watching carbs and fried foods. Taking glipizide. Checks glucose BID. 30 day avg 114. Lowest am glucose of 63. Going to optometrist.   [name] pain: R side on biting down. Occurred after R mandible tooth broke 6-8 wks ago. Intermittent some weeks and not others. No change in vision or acute visual loss  CKD III: making excellent urine. No complaints of dysuria, frequency or decreased production, or increased fluid in LE. Not on lasix. NSAIDs occasionally for joint pain.  HTN: not taking lasix. Taking carvedilol and amlodipine and hydralazine  Last colonoscopy: 2008. Unsure of results will schedule another soon. Denies melana, tarry stools, hematochezia, BRPPR.   The following portions of the patient's history were reviewed and updated as appropriate: allergies, current medications, past medical history, family and social history, and problem list.  Patient is a nonsmoker.  Past Medical History  Diagnosis Date  . Diabetes mellitus   . Hypertension   . Hyperlipidemia   . Chronic kidney disease   . PVD (peripheral vascular disease)   . Venous insufficiency   . Morbid obesity   . Chronic cystitis   . Nephrolithiasis   . Arthritis   . UMBILICAL HERNIA [id number]    Qualifier: History of  By: [name] MD, Audelia Acton    . NEPHROLITHIASIS, HX OF 12/02/2009    Qualifier: Diagnosis of  By: [name], Cat      ROS as above otherwise neg.    Medications reviewed. Current Outpatient Prescriptions  Medication Sig Dispense Refill  . amLODipine (NORVASC) 10 MG tablet take 1 tablet by mouth once daily  90 tablet  0  . aspirin 81 MG tablet Take 81 mg by mouth daily.        . carvedilol (COREG) 25 MG tablet Take 1 tablet (25 mg total) by mouth 2 (two) times daily with a meal.  60 tablet  6  . glipiZIDE (GLUCOTROL) 10 MG tablet Take 2 tablets (20 mg total) by mouth 2 (two) times daily before a  meal.  240 tablet  1  . hydrALAZINE (APRESOLINE) 100 MG tablet take 1 tablet by mouth three times a day  270 tablet  1  . simvastatin (ZOCOR) 80 MG tablet Take 1 tablet (80 mg total) by mouth daily.  90 tablet  1  . ACCU-CHEK AVIVA PLUS test strip USE TO CHECK BLOOD GLUCOSE 2 TIMES DAILY  100 each  5  . Blood Glucose Monitoring Suppl (ACCU-CHEK AVIVA PLUS) W/DEVICE KIT 1 Device by Does not apply route 2 (two) times daily.  1 kit  1   No current facility-administered medications for this visit.    Exam:  BP 164/60  Pulse 58  Temp(Src) 98.2 F (36.8 C) (Oral)  Wt 303 lb (137.44 kg)  BMI 42.28 kg/m2 Gen: Well NAD HEENT: EOMI,  MMM   Results for orders placed in visit on 07/09/13 (from the past 72 hour(s))  POCT GLYCOSYLATED HEMOGLOBIN (HGB A1C)     Status: None   Collection Time    07/09/13  1:57 PM      Result Value Range   Hemoglobin A1C 6.1

## 2013-07-09 NOTE — Assessment & Plan Note (Signed)
A1c improved. Excellent control w/ the glpiizide No metformin based on renal  Cont current therapy

## 2013-07-09 NOTE — Assessment & Plan Note (Signed)
No renal eval since 2011. Pt needs to [facility] w/ renal [facility] today Refer to [facility] DC lasix as fluid well controlled and renal failure

## 2013-07-09 NOTE — Assessment & Plan Note (Signed)
At baseline Cont leg wrapping

## 2013-07-09 NOTE — Assessment & Plan Note (Signed)
Elevated 164/60 today Taking amlodipine, hydralazine, and carvedilol

## 2013-07-09 NOTE — Patient Instructions (Addendum)
You are doing well overall. Avoid ibuprofen or advil whenever possible Please stop taking the lasix (fluid pill) Please start taking the increased carvedilol dose The kidney doctors will call you with an appointment The GI doctors will call you to schedule your colonoscopy Please come back for blood work after 9/5 for fasting labs Come back to see me in 3 months

## 2013-08-18 ENCOUNTER — Encounter (HOSPITAL_COMMUNITY): Payer: Self-pay | Admitting: Emergency Medicine

## 2013-08-18 ENCOUNTER — Emergency Department (HOSPITAL_COMMUNITY)
Admission: EM | Admit: 2013-08-18 | Discharge: 2013-08-19 | Disposition: A | Discharge status: Home / Self Care | Payer: Medicare Other | Attending: Emergency Medicine | Admitting: Emergency Medicine

## 2013-08-18 ENCOUNTER — Emergency Department (HOSPITAL_COMMUNITY): Payer: Medicare Other

## 2013-08-18 DIAGNOSIS — Z8679 Personal history of other diseases of the circulatory system: Secondary | ICD-10-CM | POA: Insufficient documentation

## 2013-08-18 DIAGNOSIS — E785 Hyperlipidemia, unspecified: Secondary | ICD-10-CM | POA: Insufficient documentation

## 2013-08-18 DIAGNOSIS — E119 Type 2 diabetes mellitus without complications: Secondary | ICD-10-CM | POA: Insufficient documentation

## 2013-08-18 DIAGNOSIS — M129 Arthropathy, unspecified: Secondary | ICD-10-CM | POA: Insufficient documentation

## 2013-08-18 DIAGNOSIS — R0602 Shortness of breath: Secondary | ICD-10-CM | POA: Insufficient documentation

## 2013-08-18 DIAGNOSIS — N189 Chronic kidney disease, unspecified: Secondary | ICD-10-CM | POA: Insufficient documentation

## 2013-08-18 DIAGNOSIS — Z79899 Other long term (current) drug therapy: Secondary | ICD-10-CM | POA: Insufficient documentation

## 2013-08-18 DIAGNOSIS — N390 Urinary tract infection, site not specified: Secondary | ICD-10-CM | POA: Diagnosis present

## 2013-08-18 DIAGNOSIS — R079 Chest pain, unspecified: Secondary | ICD-10-CM | POA: Insufficient documentation

## 2013-08-18 DIAGNOSIS — I129 Hypertensive chronic kidney disease with stage 1 through stage 4 chronic kidney disease, or unspecified chronic kidney disease: Secondary | ICD-10-CM | POA: Insufficient documentation

## 2013-08-18 DIAGNOSIS — Z87891 Personal history of nicotine dependence: Secondary | ICD-10-CM | POA: Insufficient documentation

## 2013-08-18 DIAGNOSIS — R609 Edema, unspecified: Secondary | ICD-10-CM | POA: Insufficient documentation

## 2013-08-18 DIAGNOSIS — Z87442 Personal history of urinary calculi: Secondary | ICD-10-CM | POA: Insufficient documentation

## 2013-08-18 DIAGNOSIS — Z7982 Long term (current) use of aspirin: Secondary | ICD-10-CM | POA: Insufficient documentation

## 2013-08-18 DIAGNOSIS — Z8719 Personal history of other diseases of the digestive system: Secondary | ICD-10-CM | POA: Insufficient documentation

## 2013-08-18 DIAGNOSIS — M7989 Other specified soft tissue disorders: Secondary | ICD-10-CM | POA: Diagnosis present

## 2013-08-18 DIAGNOSIS — Z87448 Personal history of other diseases of urinary system: Secondary | ICD-10-CM | POA: Insufficient documentation

## 2013-08-18 NOTE — ED Provider Notes (Signed)
CSN: [id number]     Arrival date & time 08/18/13  2248 History   First MD Initiated Contact with Patient 08/18/13 2317     Chief Complaint  Patient presents with  . Leg Swelling   (Consider location/radiation/quality/duration/timing/severity/associated sxs/prior Treatment) HPI Comments: 62 year old male who presents with increased swelling to his right lower extremity. He notes gradual increase in swelling over the last 2-3 days. He denies any significant increase in pain. He states that he has been taking his Lasix normally and has not had any alterations in his diet. The severity of his symptoms is mild. He denies any other associated symptoms including chest pain, shortness of breath, fever. Nothing has relieved his symptoms thus far. He has not had similar symptoms in the past.  The history is provided by the patient.    Past Medical History  Diagnosis Date  . Diabetes mellitus   . Hypertension   . Hyperlipidemia   . Chronic kidney disease   . PVD (peripheral vascular disease)   . Venous insufficiency   . Morbid obesity   . Chronic cystitis   . Nephrolithiasis   . Arthritis   . UMBILICAL HERNIA [id number]    Qualifier: History of  By: [name] MD, Audelia Acton    . NEPHROLITHIASIS, HX OF 12/02/2009    Qualifier: Diagnosis of  By: Earley Favor MD, Cat     Past Surgical History  Procedure Laterality Date  . R knee orthoscopic repair of meniscus     Family History  Problem Relation Age of Onset  . Diabetes Mother   . Diabetes Brother    History  Substance Use Topics  . Smoking status: Former Smoker    Quit date: 06/06/1979  . Smokeless tobacco: Not on file  . Alcohol Use: No     Comment: occasional    Review of Systems  Constitutional: Negative for fever.  HENT: Negative for rhinorrhea, drooling and neck pain.   Eyes: Negative for pain.  Respiratory: Negative for cough and shortness of breath.   Cardiovascular: Negative for chest pain and leg swelling.  Gastrointestinal: Negative  for nausea, vomiting, abdominal pain and diarrhea.  Genitourinary: Negative for dysuria and hematuria.  Musculoskeletal: Negative for gait problem.  Skin: Negative for color change.  Neurological: Negative for numbness and headaches.  Hematological: Negative for adenopathy.  Psychiatric/Behavioral: Negative for behavioral problems.  All other systems reviewed and are negative.    Allergies  Iodine  Home Medications   Current Outpatient Rx  Name  Route  Sig  Dispense  Refill  . amLODipine (NORVASC) 10 MG tablet      take 1 tablet by mouth once daily   90 tablet   0   . aspirin 81 MG tablet   Oral   Take 81 mg by mouth daily.           . carvedilol (COREG) 25 MG tablet   Oral   Take 1 tablet (25 mg total) by mouth 2 (two) times daily with a meal.   60 tablet   6   . furosemide (LASIX) 80 MG tablet   Oral   Take 80 mg by mouth daily.         [name] glipiZIDE (GLUCOTROL) 10 MG tablet   Oral   Take 2 tablets (20 mg total) by mouth 2 (two) times daily before a meal.   240 tablet   1   . hydrALAZINE (APRESOLINE) 100 MG tablet      take 1 tablet by mouth  three times a day   270 tablet   1   . simvastatin (ZOCOR) 80 MG tablet   Oral   Take 1 tablet (80 mg total) by mouth daily.   90 tablet   1    BP 158/63  Pulse 64  Temp(Src) 98.8 F (37.1 C) (Oral)  Ht 5\' 11"  (1.803 m)  Wt 295 lb (133.811 kg)  BMI 41.16 kg/m2 Physical Exam  Nursing note and vitals reviewed. Constitutional: He is oriented to person, place, and time. He appears well-developed and well-nourished.  HENT:  Head: Normocephalic and atraumatic.  Right Ear: External ear normal.  Left Ear: External ear normal.  Nose: Nose normal.  Mouth/Throat: Oropharynx is clear and moist. No oropharyngeal exudate.  Eyes: Conjunctivae and EOM are normal. Pupils are equal, round, and reactive to light.  Neck: Normal range of motion. Neck supple.  Cardiovascular: Normal rate, regular rhythm, normal heart  sounds and intact distal pulses.  Exam reveals no gallop and no friction rub.   No murmur heard. Pulmonary/Chest: Effort normal and breath sounds normal. No respiratory distress. He has no wheezes.  Abdominal: Soft. Bowel sounds are normal. He exhibits no distension. There is no tenderness. There is no rebound and no guarding.  Musculoskeletal: Normal range of motion. He exhibits edema.  Chronic lymphedema in bilateral LE's extending to the thighs. LE's are asymmetric w/ the RLE below the knee being greater in circumference than the left.   2+ DP pulses.   Neurological: He is alert and oriented to person, place, and time.  Skin: Skin is warm and dry.  Dryness and scaling to both LE's. Mild maceration noted on both LE's. The skin is fragile and bleeds easily.   Psychiatric: He has a normal mood and affect. His behavior is normal.    ED Course  Procedures (including critical care time) Labs Review Labs Reviewed  CBC WITH DIFFERENTIAL - Abnormal; Notable for the following:    WBC 12.8 (*)    RBC 3.47 (*)    Hemoglobin 9.5 (*)    HCT 28.1 (*)    Neutro Abs 9.3 (*)    All other components within normal limits  COMPREHENSIVE METABOLIC PANEL - Abnormal; Notable for the following:    BUN 45 (*)    Creatinine, Ser 4.49 (*)    Calcium 8.2 (*)    Albumin 2.6 (*)    Total Bilirubin 0.2 (*)    GFR calc non Af Amer 13 (*)    GFR calc Af Amer 15 (*)    All other components within normal limits  PRO B NATRIURETIC PEPTIDE - Abnormal; Notable for the following:    Pro B Natriuretic peptide (BNP) 740.9 (*)    All other components within normal limits  URINALYSIS W MICROSCOPIC + REFLEX CULTURE - Abnormal; Notable for the following:    APPearance CLOUDY (*)    Protein, ur >300 (*)    Bacteria, UA MANY (*)    All other components within normal limits  URINE CULTURE   Imaging Review Dg Chest Port 1 View  08/19/2013   *RADIOLOGY REPORT*  Clinical Data: Chest pain and shortness of breath   PORTABLE CHEST - 1 VIEW  Comparison: 05/06/2011  Findings: Heart size appears normal.  There is no pleural effusion or edema.  Lungs are hyperinflated but clear.    The visualized osseous structures are unremarkable.  IMPRESSION:  1.  No acute cardiopulmonary abnormalities.   Original Report Authenticated By: Kerby Moors, M.D.  Date: 08/19/2013  Rate: 59  Rhythm: normal sinus rhythm  QRS Axis: normal  Intervals: PR prolonged  ST/T Wave abnormalities: normal  Conduction Disutrbances:none  Narrative Interpretation: No ST or T wave changes consistent with ischemia.    Old EKG Reviewed: none available   MDM   1. UTI (lower urinary tract infection)   2. Right leg swelling    11:45 PM 62 y.o. male with history of CKD, DVT and lymphedema in his lower extremities presents with worsening swelling in the right lower extremity for the last 2-3 days. The patient denies chest pain, shortness of breath, fever, increased pain. He is afebrile and his vital signs are unremarkable here. He appears well on exam. Will get screening labs. Suspect pt will need [place] to r/o DVT.   2:46 AM: Pt's sx likely d/t worsening lymphedema, he denies any change in diet or missing his lasix. As he has a hx of DVT in the RLE I think it is prudent to r/o DVT. As [place] for this is unavailable at night. Will place order for him to return to [facility] for the [place]. I discussed lovenox w/ the pharmicist d/t his CKD. Will give 1 mg/kg, pharmacist notes that this will remain in his system for 24 hours as opposed to the normal 12 hr. If found to have a DVT his CKD should be factored in to the frequency of dosing. Pt does still make urine multiple times per day, able to provide a sample here tonight.   Pt also incidentally found to have UA concerning for UTI. Will tx w/ cipro. I have discussed the diagnosis/risks/treatment options with the patient and believe the pt to be eligible for discharge home to follow-up with pcp this week and  here tomorrow for [place]. We also discussed returning to the ED immediately if new or worsening sx occur. We discussed the sx which are most concerning (e.g., inc swelling, pain, sob) that necessitate immediate return. Any new prescriptions provided to the patient are listed below.  Discharge Medication List as of 08/19/2013  2:49 AM    START taking these medications   Details  ciprofloxacin (CIPRO) 500 MG tablet Take 1 tablet (500 mg total) by mouth every 12 (twelve) hours., Starting 08/19/2013, Until Discontinued, Print         Blanchard Kelch, MD 08/19/13 [phone]

## 2013-08-18 NOTE — ED Notes (Signed)
Swelling started on Thursday, swelling hasn't changed since Thursday.

## 2013-08-19 DIAGNOSIS — M7989 Other specified soft tissue disorders: Secondary | ICD-10-CM | POA: Diagnosis present

## 2013-08-19 DIAGNOSIS — N390 Urinary tract infection, site not specified: Secondary | ICD-10-CM | POA: Diagnosis present

## 2013-08-19 LAB — COMPREHENSIVE METABOLIC PANEL
ALT: 10 U/L (ref 0–53)
AST: 9 U/L (ref 0–37)
Albumin: 2.6 g/dL — ABNORMAL LOW (ref 3.5–5.2)
Alkaline Phosphatase: 72 U/L (ref 39–117)
Glucose, Bld: 92 mg/dL (ref 70–99)
Potassium: 4 mEq/L (ref 3.5–5.1)
Sodium: 137 mEq/L (ref 135–145)
Total Protein: 7.6 g/dL (ref 6.0–8.3)

## 2013-08-19 LAB — CBC WITH DIFFERENTIAL/PLATELET
Basophils Relative: 0 % (ref 0–1)
Eosinophils Absolute: 0.6 10*3/uL (ref 0.0–0.7)
HCT: 28.1 % — ABNORMAL LOW (ref 39.0–52.0)
Hemoglobin: 9.5 g/dL — ABNORMAL LOW (ref 13.0–17.0)
MCH: 27.4 pg (ref 26.0–34.0)
MCHC: 33.8 g/dL (ref 30.0–36.0)
Monocytes Absolute: 0.9 10*3/uL (ref 0.1–1.0)
Monocytes Relative: 7 % (ref 3–12)
Neutrophils Relative %: 72 % (ref 43–77)

## 2013-08-19 LAB — URINALYSIS W MICROSCOPIC + REFLEX CULTURE
Bilirubin Urine: NEGATIVE
Hgb urine dipstick: NEGATIVE
Specific Gravity, Urine: 1.02 (ref 1.005–1.030)
pH: 5.5 (ref 5.0–8.0)

## 2013-08-19 MED ORDER — CIPROFLOXACIN HCL 500 MG PO TABS
500.0000 mg | ORAL_TABLET | Freq: Once | ORAL | Status: AC
  Administered 2013-08-19: 500 mg via ORAL
  Filled 2013-08-19: qty 1

## 2013-08-19 MED ORDER — CIPROFLOXACIN HCL 500 MG PO TABS: 500.0000 mg | ORAL_TABLET | Freq: Two times a day (BID) | ORAL | Status: DC

## 2013-08-19 MED ORDER — ENOXAPARIN SODIUM 150 MG/ML ~~LOC~~ SOLN
1.0000 mg/kg | Freq: Once | SUBCUTANEOUS | Status: AC
  Administered 2013-08-19: 135 mg via SUBCUTANEOUS
  Filled 2013-08-19: qty 1

## 2013-08-20 LAB — URINE CULTURE: Colony Count: >=100000

## 2013-08-21 ENCOUNTER — Telehealth: Payer: Self-pay | Admitting: Family Medicine

## 2013-08-21 NOTE — Telephone Encounter (Signed)
Pt called because he was seen in the ER on Sunday was given heprarin because they thought his leg had a blood clot. Since then his blood sugar has been up and down as low as 50 and back up to 210. He is unsure what to do or if he should come in. [place]

## 2013-08-21 NOTE — Telephone Encounter (Signed)
Called and spoke to pt.  Fluctuations in glucose likely from flulike symptoms and poor po. Pt instructed to cut his glipizide dose in half (from 20mg  BID to 10mg  BID).  If continues to drop instructed to quarter dose F/u PRN  Linna Darner, MD Family Medicine PGY-3 08/21/2013, 5:22 PM

## 2013-08-21 NOTE — Telephone Encounter (Signed)
Returned call to patient.  Patient states he was in hospital for [place]. DVT and received Heparin.  Afterwards, his CBG was 50.  Wants s diabetic and takes glipizide 10 mg BID.  Patient states he felt weak.  CBGs have been fluctuating between 50 and 200's.  Thinks it may be related to Heparin injection.  Last DM f/u was 06/2013.  Does not have a hospital f/u appt scheduled here, but does have appt with [facility] on 08/23/13.  Will route note to Dr. Marily Memos for advice and call patient back.  Nolene Ebbs, RN

## 2013-08-23 ENCOUNTER — Encounter (HOSPITAL_BASED_OUTPATIENT_CLINIC_OR_DEPARTMENT_OTHER): Payer: Medicare Other | Attending: General Surgery

## 2013-08-23 DIAGNOSIS — L97909 Non-pressure chronic ulcer of unspecified part of unspecified lower leg with unspecified severity: Secondary | ICD-10-CM | POA: Insufficient documentation

## 2013-08-23 DIAGNOSIS — I87319 Chronic venous hypertension (idiopathic) with ulcer of unspecified lower extremity: Secondary | ICD-10-CM | POA: Insufficient documentation

## 2013-09-09 ENCOUNTER — Other Ambulatory Visit: Payer: Self-pay | Admitting: Family Medicine

## 2013-09-09 DIAGNOSIS — E1165 Type 2 diabetes mellitus with hyperglycemia: Secondary | ICD-10-CM

## 2013-09-09 DIAGNOSIS — IMO0002 Reserved for concepts with insufficient information to code with codable children: Secondary | ICD-10-CM

## 2013-09-09 MED ORDER — FUROSEMIDE 80 MG PO TABS: 80.0000 mg | ORAL_TABLET | Freq: Every day | ORAL | Status: DC

## 2013-09-09 MED ORDER — GLIPIZIDE 10 MG PO TABS: 20.0000 mg | ORAL_TABLET | Freq: Two times a day (BID) | ORAL | Status: DC

## 2013-09-12 ENCOUNTER — Encounter: Payer: Self-pay | Admitting: Gastroenterology

## 2013-09-13 ENCOUNTER — Encounter (HOSPITAL_BASED_OUTPATIENT_CLINIC_OR_DEPARTMENT_OTHER): Payer: Medicare Other | Attending: General Surgery

## 2013-09-13 DIAGNOSIS — I890 Lymphedema, not elsewhere classified: Secondary | ICD-10-CM | POA: Insufficient documentation

## 2013-09-13 DIAGNOSIS — I87319 Chronic venous hypertension (idiopathic) with ulcer of unspecified lower extremity: Secondary | ICD-10-CM | POA: Insufficient documentation

## 2013-09-13 DIAGNOSIS — L97909 Non-pressure chronic ulcer of unspecified part of unspecified lower leg with unspecified severity: Secondary | ICD-10-CM | POA: Insufficient documentation

## 2013-10-08 ENCOUNTER — Other Ambulatory Visit: Payer: Self-pay | Admitting: Family Medicine

## 2013-10-08 MED ORDER — AMLODIPINE BESYLATE 10 MG PO TABS: ORAL_TABLET | ORAL | Status: DC

## 2013-10-14 ENCOUNTER — Ambulatory Visit (INDEPENDENT_AMBULATORY_CARE_PROVIDER_SITE_OTHER): Payer: Medicare Other | Admitting: Family Medicine

## 2013-10-14 ENCOUNTER — Encounter: Payer: Self-pay | Admitting: Family Medicine

## 2013-10-14 VITALS — BP 140/84 | HR 63 | Temp 97.5°F | Wt 302.0 lb

## 2013-10-14 DIAGNOSIS — R0602 Shortness of breath: Secondary | ICD-10-CM

## 2013-10-14 DIAGNOSIS — E1165 Type 2 diabetes mellitus with hyperglycemia: Secondary | ICD-10-CM

## 2013-10-14 DIAGNOSIS — I10 Essential (primary) hypertension: Secondary | ICD-10-CM

## 2013-10-14 LAB — POCT GLYCOSYLATED HEMOGLOBIN (HGB A1C): Hemoglobin A1C: 5.9

## 2013-10-14 MED ORDER — GLIPIZIDE 10 MG PO TABS: 10.0000 mg | ORAL_TABLET | Freq: Every day | ORAL | Status: DC

## 2013-10-14 NOTE — Patient Instructions (Signed)
You are doing well overall Please come back to see me in 3 months Please decrease your glipizide to 10mg  in the morning only Please go have your echo performed adn follow up with the cardiologist.

## 2013-10-14 NOTE — Assessment & Plan Note (Addendum)
A1c today CBG too low Decrease glipizide further to 10mg  daily  Lab Results  Component Value Date   HGBA1C 5.9 10/14/2013

## 2013-10-14 NOTE — Assessment & Plan Note (Addendum)
Concern for CHF/CAD. 2D Echo ordered Referral to Cards as pt likely to also benefit Stress test and routine care given h/o HTN, CKD, DM, and HLD

## 2013-10-14 NOTE — Assessment & Plan Note (Addendum)
At goal Cont current regimen  

## 2013-10-14 NOTE — Assessment & Plan Note (Signed)
Making urine Regular f/u w/ [facility] today (likely progressing to CKD IV) from HTN and DM.

## 2013-10-14 NOTE — Progress Notes (Signed)
[name] is a 62 y.o. male who presents to [facility] today for diabetic check  Shortness of breath. Present since beginning of year. Takes lasix for Venous insufficiency about 2xwkly.[name] SOB when off lasix when ambulating 50 feet. Denies CP, palpitations, syncope. Sleeps sitting up out of habit but not b/c of SOB. No fmhx of heart disease. Lat Echo in 2008 w/o sign of CHF  Renal: making urine regularly and seen by [facility].   DM: home CBG around 110-150. Decreased glipizide to 10mg  BID after September.  Occasional tingle in toes.   HTN: ROS as above. Takes hydralazine, carvedilol, and amlodipine  The following portions of the patient's history were reviewed and updated as appropriate: allergies, current medications, past medical history, family and social history, and problem list.  Patient is a nonsmoker.  Past Medical History  Diagnosis Date  . Diabetes mellitus   . Hypertension   . Hyperlipidemia   . Chronic kidney disease   . PVD (peripheral vascular disease)   . Venous insufficiency   . Morbid obesity   . Chronic cystitis   . Nephrolithiasis   . Arthritis   . UMBILICAL HERNIA [id number]    Qualifier: History of  By: [name] MD, Audelia Acton    . NEPHROLITHIASIS, HX OF 12/02/2009    Qualifier: Diagnosis of  By: [name], Cat      ROS as above otherwise neg.    Medications reviewed. Current Outpatient Prescriptions  Medication Sig Dispense Refill  . amLODipine (NORVASC) 10 MG tablet take 1 tablet by mouth once daily  90 tablet  3  . aspirin 81 MG tablet Take 81 mg by mouth daily.        . carvedilol (COREG) 25 MG tablet Take 1 tablet (25 mg total) by mouth 2 (two) times daily with a meal.  60 tablet  6  . ciprofloxacin (CIPRO) 500 MG tablet Take 1 tablet (500 mg total) by mouth every 12 (twelve) hours.  14 tablet  0  . furosemide (LASIX) 80 MG tablet Take 1 tablet (80 mg total) by mouth daily.  90 tablet  3  . glipiZIDE (GLUCOTROL) 10 MG tablet Take 1 tablet (10 mg total) by  mouth daily before breakfast.  90 tablet  3  . hydrALAZINE (APRESOLINE) 100 MG tablet take 1 tablet by mouth three times a day  270 tablet  1  . simvastatin (ZOCOR) 80 MG tablet Take 1 tablet (80 mg total) by mouth daily.  90 tablet  1   No current facility-administered medications for this visit.    Exam:  BP 140/84  Pulse 63  Temp(Src) 97.5 F (36.4 C) (Oral)  Wt 302 lb (136.986 kg) Gen: Well NAD HEENT: EOMI,  MMM MSK: 5th toes bilat amputated. Sensation w/ monofilament bilat no ulceration.  Skin: foot skin hyperkaratotic  Diabetic foot exam performed and paperwork filed for shoes.   No results found for this or any previous visit (from the past 72 hour(s)).

## 2013-10-15 LAB — COMPREHENSIVE METABOLIC PANEL
ALT: 11 U/L (ref 0–53)
Albumin: 3.3 g/dL — ABNORMAL LOW (ref 3.5–5.2)
Alkaline Phosphatase: 72 U/L (ref 39–117)
CO2: 23 mEq/L (ref 19–32)
Glucose, Bld: 60 mg/dL — ABNORMAL LOW (ref 70–99)
Potassium: 4.7 mEq/L (ref 3.5–5.3)
Sodium: 139 mEq/L (ref 135–145)
Total Bilirubin: 0.2 mg/dL — ABNORMAL LOW (ref 0.3–1.2)
Total Protein: 7.7 g/dL (ref 6.0–8.3)

## 2013-10-30 ENCOUNTER — Encounter: Payer: Self-pay | Admitting: Cardiology

## 2013-10-30 ENCOUNTER — Ambulatory Visit (INDEPENDENT_AMBULATORY_CARE_PROVIDER_SITE_OTHER): Payer: Medicare Other | Admitting: Cardiology

## 2013-10-30 VITALS — BP 158/78 | HR 56 | Ht 71.0 in | Wt 302.1 lb

## 2013-10-30 DIAGNOSIS — I5032 Chronic diastolic (congestive) heart failure: Secondary | ICD-10-CM | POA: Insufficient documentation

## 2013-10-30 DIAGNOSIS — R0609 Other forms of dyspnea: Secondary | ICD-10-CM

## 2013-10-30 DIAGNOSIS — I509 Heart failure, unspecified: Secondary | ICD-10-CM

## 2013-10-30 DIAGNOSIS — R0600 Dyspnea, unspecified: Secondary | ICD-10-CM

## 2013-10-30 DIAGNOSIS — R0989 Other specified symptoms and signs involving the circulatory and respiratory systems: Secondary | ICD-10-CM

## 2013-10-30 NOTE — Patient Instructions (Signed)
The current medical regimen is effective;  continue present plan and medications.  Your physician has requested that you have an echocardiogram. Echocardiography is a painless test that uses sound waves to create images of your heart. It provides your doctor with information about the size and shape of your heart and how well your heart's chambers and valves are working. This procedure takes approximately one hour. There are no restrictions for this procedure.  Your physician has requested that you have a carotid duplex. This test is an ultrasound of the carotid arteries in your neck. It looks at blood flow through these arteries that supply the brain with blood. Allow one hour for this exam. There are no restrictions or special instructions.  Please limit your sodium/salt intake.  Please limit your fluid intake to 2 liters a day.  Follow up in 1 month with Dr Percival Spanish.  2 Gram Low Sodium Diet A 2 gram sodium diet restricts the amount of sodium in the diet to no more than 2 g or 2000 mg daily. Limiting the amount of sodium is often used to help lower blood pressure. It is important if you have heart, liver, or kidney problems. Many foods contain sodium for flavor and sometimes as a preservative. When the amount of sodium in a diet needs to be low, it is important to know what to look for when choosing foods and drinks. The following includes some information and guidelines to help make it easier for you to adapt to a low sodium diet. QUICK TIPS  Do not add salt to food.  Avoid convenience items and fast food.  Choose unsalted snack foods.  Buy lower sodium products, often labeled as "lower sodium" or "no salt added."  Check food labels to learn how much sodium is in 1 serving.  When eating at a restaurant, ask that your food be prepared with less salt or none, if possible. READING FOOD LABELS FOR SODIUM INFORMATION The nutrition facts label is a good place to find how much sodium is in  foods. Look for products with no more than 500 to 600 mg of sodium per meal and no more than 150 mg per serving. Remember that 2 g = 2000 mg. The food label may also list foods as:  Sodium-free: Less than 5 mg in a serving.  Very low sodium: 35 mg or less in a serving.  Low-sodium: 140 mg or less in a serving.  Light in sodium: 50% less sodium in a serving. For example, if a food that usually has 300 mg of sodium is changed to become light in sodium, it will have 150 mg of sodium.  Reduced sodium: 25% less sodium in a serving. For example, if a food that usually has 400 mg of sodium is changed to reduced sodium, it will have 300 mg of sodium. CHOOSING FOODS Grains  Avoid: Salted crackers and snack items. Some cereals, including instant hot cereals. Bread stuffing and biscuit mixes. Seasoned rice or pasta mixes.  Choose: Unsalted snack items. Low-sodium cereals, oats, puffed wheat and rice, shredded wheat. English muffins and bread. Pasta. Meats  Avoid: Salted, canned, smoked, spiced, pickled meats, including fish and poultry. Bacon, ham, sausage, cold cuts, hot dogs, anchovies.  Choose: Low-sodium canned tuna and salmon. Fresh or frozen meat, poultry, and fish. Dairy  Avoid: Processed cheese and spreads. Cottage cheese. Buttermilk and condensed milk. Regular cheese.  Choose: Milk. Low-sodium cottage cheese. Yogurt. Sour cream. Low-sodium cheese. Fruits and Vegetables  Avoid: Regular canned  vegetables. Regular canned tomato sauce and paste. Frozen vegetables in sauces. Olives. Angie Fava. Relishes. Sauerkraut.  Choose: Low-sodium canned vegetables. Low-sodium tomato sauce and paste. Frozen or fresh vegetables. Fresh and frozen fruit. Condiments  Avoid: Canned and packaged gravies. Worcestershire sauce. Tartar sauce. Barbecue sauce. Soy sauce. Steak sauce. Ketchup. Onion, garlic, and table salt. Meat flavorings and tenderizers.  Choose: Fresh and dried herbs and spices. Low-sodium  varieties of mustard and ketchup. Lemon juice. Tabasco sauce. Horseradish. SAMPLE 2 GRAM SODIUM MEAL PLAN Breakfast / Sodium (mg)  1 cup low-fat milk / [id number] mg  2 slices whole-wheat toast / 270 mg  1 tbs heart-healthy margarine / 153 mg  1 hard-boiled egg / 139 mg  1 small orange / 0 mg Lunch / Sodium (mg)  1 cup raw carrots / 76 mg   cup hummus / 298 mg  1 cup low-fat milk / 143 mg   cup red grapes / 2 mg  1 whole-wheat pita bread / 356 mg Dinner / Sodium (mg)  1 cup whole-wheat pasta / 2 mg  1 cup low-sodium tomato sauce / 73 mg  3 oz lean ground beef / 57 mg  1 small side salad (1 cup raw spinach leaves,  cup cucumber,  cup yellow bell pepper) with 1 tsp olive oil and 1 tsp red wine vinegar / 25 mg Snack / Sodium (mg)  1 container low-fat vanilla yogurt / 107 mg  3 graham cracker squares / 127 mg Nutrient Analysis  Calories: 2033  Protein: 77 g  Carbohydrate: 282 g  Fat: 72 g  Sodium: 1971 mg Document Released: 11/28/2005 Document Revised: 02/20/2012 Document Reviewed: 03/01/2010 [employer] Patient Information 2014 [place].

## 2013-10-30 NOTE — Progress Notes (Signed)
HPI The patient presents for evaluation of dyspnea. He has been short of breath for some time. He has not had any prior history of coronary artery disease although he was told years ago that he had fluid around his heart. He does not remember ever having an echocardiogram though he had stress test a long time ago. He gets dyspneic with activity such as walking a flight of stairs. He can make it up the stairs but he is short of breath when he gets the top. He says he does recover quickly. He chronically sleeps in a chair and has done this for a long time. He sleeps alone and doesn't know whether he snores. He does not however describe PND. He doesn't report chest pressure, neck or arm discomfort. He's not reported palpitations, presyncope or syncope. He says on the days he takes his Lasix he does breathe better. He has chronic lower extremity swelling secondary to lymphedema and has his legs wrapped.   Allergies  Allergen Reactions  . Shrimp [Shellfish Allergy] Swelling  . Iodine Swelling    Current Outpatient Prescriptions  Medication Sig Dispense Refill  . amLODipine (NORVASC) 10 MG tablet take 1 tablet by mouth once daily  90 tablet  3  . aspirin 81 MG tablet Take 81 mg by mouth daily.        . carvedilol (COREG) 12.5 MG tablet Take 12.5 mg by mouth 2 (two) times daily with a meal.      . furosemide (LASIX) 80 MG tablet Take 80 mg by mouth 2 (two) times daily.      [name] glipiZIDE (GLUCOTROL) 10 MG tablet Take 1 tablet (10 mg total) by mouth daily before breakfast.  90 tablet  3  . hydrALAZINE (APRESOLINE) 100 MG tablet take 1 tablet by mouth twice a day      . simvastatin (ZOCOR) 80 MG tablet Take 1 tablet (80 mg total) by mouth daily.  90 tablet  1  . Vitamins-Lipotropics (LIPO-FLAVONOID PLUS PO) Take 1 tablet by mouth 3 (three) times daily.       No current facility-administered medications for this visit.    Past Medical History  Diagnosis Date  . Diabetes mellitus   . Hypertension     . Hyperlipidemia   . Chronic kidney disease   . PVD (peripheral vascular disease)   . Venous insufficiency   . Morbid obesity   . Chronic cystitis   . Arthritis   . UMBILICAL HERNIA [id number]    Qualifier: History of  By: [name] MD, Audelia Acton    . NEPHROLITHIASIS, HX OF 12/02/2009    Qualifier: Diagnosis of  By: [name], Cat    . Lymphedema     Past Surgical History  Procedure Laterality Date  . R knee arthoscopic repair of meniscus    . Umbilical hernia repair      Family History  Problem Relation Age of Onset  . Diabetes Mother   . Diabetes Brother     History   Social History  . Marital Status: Legally Separated    Spouse Name: N/A    Number of Children: N/A  . Years of Education: N/A   Occupational History  . Not on file.   Social History Main Topics  . Smoking status: Former Smoker    Quit date: 06/06/1979  . Smokeless tobacco: Not on file  . Alcohol Use: No     Comment: occasional  . Drug Use: No  . Sexual Activity: No  Other Topics Concern  . Not on file   Social History Narrative  . No narrative on file    ROS:  Positive for tinnitus, urinary frequency, joint pains. Otherwise as stated in the HPI and negative for all other systems.  PHYSICAL EXAM BP 158/78  Pulse 56  Ht 5\' 11"  (1.803 m)  Wt 302 lb 1.6 oz (137.032 kg)  BMI 42.15 kg/m2 GENERAL:  Well appearing HEENT:  Pupils equal round and reactive, fundi not visualized, oral mucosa unremarkable NECK:  Jugular venous distention up to the jaw, waveform within normal limits, carotid upstroke brisk and symmetric, left bruits, no thyromegaly LYMPHATICS:  No cervical, inguinal adenopathy LUNGS:  Clear to auscultation bilaterally BACK:  No CVA tenderness CHEST:  Unremarkable HEART:  PMI not displaced or sustained,S1 and S2 within normal limits, no S3, no S4, no clicks, no rubs, apical early systolic murmur at the apex, no diastolic murmurs ABD:  Flat, positive bowel sounds normal in frequency in  pitch, no bruits, no rebound, no guarding, no midline pulsatile mass, no hepatomegaly, no splenomegaly EXT:  2 plus pulses upper absent dorsalis pedis and posterior tibialis bilaterally, his legs are wrapped, moderately severe bilateral lower extremity edema, no cyanosis no clubbing SKIN:  No rashes no nodules NEURO:  Cranial nerves II through XII grossly intact, motor grossly intact throughout PSYCH:  Cognitively intact, oriented to person place and time   EKG:  Sinus rhythm, rate 59, axis within normal limits, intervals within normal limits, no acute ST-T wave changes. 10/30/2013   ASSESSMENT AND PLAN   DYSPNEA:  He does have some evidence of heart failure with dyspnea and elevated BNP. I will start with an echocardiogram to further evaluate. I am reluctant to increase his diuretics given his renal insufficiency. We did however begin the discussion about salt restriction. I will also pursue fluid restriction and daily weights.  CKD:  He is to have followup with nephrology.  BRUIT:  I will follow up with carotid Dopplers.  HTN:  His blood pressure is slightly elevated. If we can pursue some weight loss and salt restriction we might get him to target. For now I will not change his medications  OBESITY:  The patient understands the need to lose weight with diet and exercise.

## 2013-11-06 ENCOUNTER — Ambulatory Visit (HOSPITAL_COMMUNITY): Payer: Medicare Other | Attending: Cardiology

## 2013-11-06 ENCOUNTER — Encounter: Payer: Self-pay | Admitting: Cardiology

## 2013-11-06 DIAGNOSIS — E785 Hyperlipidemia, unspecified: Secondary | ICD-10-CM | POA: Insufficient documentation

## 2013-11-06 DIAGNOSIS — E119 Type 2 diabetes mellitus without complications: Secondary | ICD-10-CM | POA: Insufficient documentation

## 2013-11-06 DIAGNOSIS — Z87891 Personal history of nicotine dependence: Secondary | ICD-10-CM | POA: Insufficient documentation

## 2013-11-06 DIAGNOSIS — I739 Peripheral vascular disease, unspecified: Secondary | ICD-10-CM | POA: Insufficient documentation

## 2013-11-06 DIAGNOSIS — I10 Essential (primary) hypertension: Secondary | ICD-10-CM | POA: Insufficient documentation

## 2013-11-06 DIAGNOSIS — R0989 Other specified symptoms and signs involving the circulatory and respiratory systems: Secondary | ICD-10-CM

## 2013-11-06 DIAGNOSIS — I6529 Occlusion and stenosis of unspecified carotid artery: Secondary | ICD-10-CM | POA: Insufficient documentation

## 2013-11-06 DIAGNOSIS — I658 Occlusion and stenosis of other precerebral arteries: Secondary | ICD-10-CM | POA: Insufficient documentation

## 2013-11-19 ENCOUNTER — Ambulatory Visit (HOSPITAL_COMMUNITY)
Admission: RE | Admit: 2013-11-19 | Discharge: 2013-11-19 | Disposition: A | Discharge status: Home / Self Care | Payer: Medicare Other | Source: Ambulatory Visit | Attending: Cardiovascular Disease | Admitting: Cardiovascular Disease

## 2013-11-19 DIAGNOSIS — R0609 Other forms of dyspnea: Secondary | ICD-10-CM | POA: Insufficient documentation

## 2013-11-19 DIAGNOSIS — I10 Essential (primary) hypertension: Secondary | ICD-10-CM | POA: Insufficient documentation

## 2013-11-19 DIAGNOSIS — E669 Obesity, unspecified: Secondary | ICD-10-CM | POA: Insufficient documentation

## 2013-11-19 DIAGNOSIS — R0602 Shortness of breath: Secondary | ICD-10-CM

## 2013-11-19 DIAGNOSIS — E119 Type 2 diabetes mellitus without complications: Secondary | ICD-10-CM | POA: Insufficient documentation

## 2013-11-19 DIAGNOSIS — I509 Heart failure, unspecified: Secondary | ICD-10-CM

## 2013-11-19 DIAGNOSIS — R0989 Other specified symptoms and signs involving the circulatory and respiratory systems: Secondary | ICD-10-CM | POA: Insufficient documentation

## 2013-11-19 NOTE — Progress Notes (Signed)
2D Echo Performed 11/19/2013    Marygrace Drought, RCS

## 2013-12-06 ENCOUNTER — Other Ambulatory Visit: Payer: Self-pay | Admitting: *Deleted

## 2013-12-06 DIAGNOSIS — R0600 Dyspnea, unspecified: Secondary | ICD-10-CM

## 2013-12-31 ENCOUNTER — Encounter (HOSPITAL_COMMUNITY): Payer: Medicare Other

## 2014-01-01 ENCOUNTER — Encounter: Payer: Self-pay | Admitting: Cardiology

## 2014-01-01 ENCOUNTER — Ambulatory Visit (HOSPITAL_COMMUNITY): Payer: Medicare Other | Attending: Cardiology | Admitting: Radiology

## 2014-01-01 VITALS — BP 212/84 | Ht 71.0 in | Wt 296.0 lb

## 2014-01-01 DIAGNOSIS — Z87891 Personal history of nicotine dependence: Secondary | ICD-10-CM | POA: Insufficient documentation

## 2014-01-01 DIAGNOSIS — I10 Essential (primary) hypertension: Secondary | ICD-10-CM | POA: Insufficient documentation

## 2014-01-01 DIAGNOSIS — I779 Disorder of arteries and arterioles, unspecified: Secondary | ICD-10-CM | POA: Insufficient documentation

## 2014-01-01 DIAGNOSIS — R0609 Other forms of dyspnea: Secondary | ICD-10-CM | POA: Insufficient documentation

## 2014-01-01 DIAGNOSIS — E785 Hyperlipidemia, unspecified: Secondary | ICD-10-CM | POA: Insufficient documentation

## 2014-01-01 DIAGNOSIS — R0602 Shortness of breath: Secondary | ICD-10-CM | POA: Insufficient documentation

## 2014-01-01 DIAGNOSIS — I739 Peripheral vascular disease, unspecified: Secondary | ICD-10-CM | POA: Insufficient documentation

## 2014-01-01 DIAGNOSIS — R0600 Dyspnea, unspecified: Secondary | ICD-10-CM

## 2014-01-01 DIAGNOSIS — R0989 Other specified symptoms and signs involving the circulatory and respiratory systems: Principal | ICD-10-CM | POA: Insufficient documentation

## 2014-01-01 DIAGNOSIS — E119 Type 2 diabetes mellitus without complications: Secondary | ICD-10-CM | POA: Insufficient documentation

## 2014-01-01 MED ORDER — TECHNETIUM TC 99M SESTAMIBI GENERIC - CARDIOLITE
30.0000 | Freq: Once | INTRAVENOUS | Status: AC | PRN
  Administered 2014-01-01: 30 via INTRAVENOUS

## 2014-01-01 MED ORDER — REGADENOSON 0.4 MG/5ML IV SOLN
0.4000 mg | Freq: Once | INTRAVENOUS | Status: AC
  Administered 2014-01-01: 0.4 mg via INTRAVENOUS

## 2014-01-01 NOTE — Progress Notes (Signed)
[facility] [address] [place], [place] [address] [phone]    Cardiology Nuclear Med Study  [name] is a 63 y.o. male     MRN : [id number]     DOB: 12/05/1951  Procedure Date: 01/01/2014  Nuclear Med Background Indication for Stress Test:  Evaluation for Ischemia History:  '06 MPI: NL EF: 58% 12/14 ECHO: EF: 60-65% Cardiac Risk Factors: Carotid Disease, History of Smoking, Hypertension, Lipids, NIDDM and PVD  Symptoms:  DOE and SOB   Nuclear Pre-Procedure Caffeine/Decaff Intake:  None NPO After: 10:00pm   Lungs:  clear O2 Sat: 97% on room air. IV 0.9% NS with Angio Cath:  22g  IV Site: R Hand  IV Started by:  Crissie Figures, RN  Chest Size (in):  48 Cup Size: n/a  Height: 5\' 11"  (1.803 m)  Weight:  296 lb (134.265 kg)  BMI:  Body mass index is 41.3 kg/(m^2). Tech Comments:  No Rx this am    Nuclear Med Study 1 or 2 day study: 2 day  Stress Test Type:  Carlton Adam  Reading MD: n/a  Order Authorizing Provider:  J.Hochrein MD  Resting Radionuclide: Technetium 22m Sestamibi  Resting Radionuclide Dose: 33.0 mCi  01/02/14  Stress Radionuclide:  Technetium 57m Sestamibi  Stress Radionuclide Dose: 33.0 mCi  01/01/14          Stress Protocol Rest HR: 59 Stress HR: 87  Rest BP: 212/84 Stress BP: 202/66  Exercise Time (min): n/a METS: n/a   Predicted Max HR: 156 bpm % Max HR: 55.77 bpm Rate Pressure Product: 18444   Dose of Adenosine (mg):  n/a Dose of Lexiscan: 0.4 mg  Dose of Atropine (mg): n/a Dose of Dobutamine: n/a mcg/kg/min (at max HR)  Stress Test Technologist: Perrin Maltese, EMT-P  Nuclear Technologist:  Charlton Amor, CNMT     Rest Procedure:  Myocardial perfusion imaging was performed at rest 45 minutes following the intravenous administration of Technetium 22m Sestamibi. Rest ECG: Sinus bradycardia  Stress Procedure:  The patient received IV Lexiscan 0.4 mg over 15-seconds.  Technetium 73m Sestamibi injected at  30-seconds. This patient was sob, felt strange, and had pressure in his head with the Lexiscan injection. Quantitative spect images were obtained after a 45 minute delay. Stress ECG: No significant change from baseline ECG  QPS Raw Data Images:  Mild diaphragmatic attenuation.  Normal left ventricular size. Stress Images:  There is decreased uptake in the inferior wall. Rest Images:  There is decreased uptake in the inferior wall. Subtraction (SDS):  There is a fixed inferior defect that is most consistent with diaphragmatic attenuation. Transient Ischemic Dilatation (Normal <1.22):  1.01 Lung/Heart Ratio (Normal <0.45):  0.34  Quantitative Gated Spect Images QGS EDV:  182 ml QGS ESV:  89 ml  Impression Exercise Capacity:  Lexiscan with low level exercise. BP Response:  Hypertensive blood pressure response. Clinical Symptoms:  There is dyspnea. ECG Impression:  No significant ST segment change suggestive of ischemia. Comparison with Prior Nuclear Study: No images to compare  Overall Impression:  Low risk stress nuclear study Fixed defect in the inferior wall most consistent with diaphragmatic attenuation.  LV Ejection Fraction: 51%.  LV Wall Motion:  Low normal LVF   Signed: Fransico Him, MD 01/03/2014

## 2014-01-02 ENCOUNTER — Encounter (INDEPENDENT_AMBULATORY_CARE_PROVIDER_SITE_OTHER): Payer: Self-pay

## 2014-01-02 ENCOUNTER — Ambulatory Visit (HOSPITAL_COMMUNITY): Payer: Medicare Other | Attending: Cardiovascular Disease | Admitting: Radiology

## 2014-01-02 ENCOUNTER — Encounter: Payer: Self-pay | Admitting: Cardiovascular Disease

## 2014-01-02 DIAGNOSIS — R0989 Other specified symptoms and signs involving the circulatory and respiratory systems: Secondary | ICD-10-CM

## 2014-01-02 MED ORDER — TECHNETIUM TC 99M SESTAMIBI GENERIC - CARDIOLITE
33.0000 | Freq: Once | INTRAVENOUS | Status: AC | PRN
  Administered 2014-01-02: 33 via INTRAVENOUS

## 2014-01-07 ENCOUNTER — Other Ambulatory Visit: Payer: Self-pay | Admitting: Family Medicine

## 2014-01-07 MED ORDER — CARVEDILOL 12.5 MG PO TABS: 12.5000 mg | ORAL_TABLET | Freq: Two times a day (BID) | ORAL | Status: DC

## 2014-01-15 ENCOUNTER — Other Ambulatory Visit: Payer: Self-pay | Admitting: Family Medicine

## 2014-01-15 MED ORDER — CARVEDILOL 12.5 MG PO TABS
12.5000 mg | ORAL_TABLET | Freq: Two times a day (BID) | ORAL | Status: DC
Start: 2014-01-15 — End: 2014-03-11

## 2014-01-17 ENCOUNTER — Telehealth: Payer: Self-pay | Admitting: Family Medicine

## 2014-01-17 NOTE — Telephone Encounter (Signed)
Pt is calling and needs refills on hydralazine. jw

## 2014-01-20 MED ORDER — HYDRALAZINE HCL 100 MG PO TABS: 100.0000 mg | ORAL_TABLET | Freq: Two times a day (BID) | ORAL | Status: DC

## 2014-02-26 ENCOUNTER — Ambulatory Visit (INDEPENDENT_AMBULATORY_CARE_PROVIDER_SITE_OTHER): Payer: Medicare Other | Admitting: Family Medicine

## 2014-02-26 ENCOUNTER — Encounter: Payer: Self-pay | Admitting: Family Medicine

## 2014-02-26 ENCOUNTER — Telehealth: Payer: Self-pay | Admitting: Family Medicine

## 2014-02-26 VITALS — BP 174/72 | HR 58 | Temp 98.5°F | Ht 71.0 in | Wt 296.0 lb

## 2014-02-26 DIAGNOSIS — N183 Chronic kidney disease, stage 3 unspecified: Secondary | ICD-10-CM

## 2014-02-26 DIAGNOSIS — IMO0002 Reserved for concepts with insufficient information to code with codable children: Secondary | ICD-10-CM

## 2014-02-26 DIAGNOSIS — E1165 Type 2 diabetes mellitus with hyperglycemia: Secondary | ICD-10-CM

## 2014-02-26 DIAGNOSIS — I509 Heart failure, unspecified: Secondary | ICD-10-CM

## 2014-02-26 DIAGNOSIS — E118 Type 2 diabetes mellitus with unspecified complications: Principal | ICD-10-CM

## 2014-02-26 DIAGNOSIS — I8310 Varicose veins of unspecified lower extremity with inflammation: Secondary | ICD-10-CM

## 2014-02-26 DIAGNOSIS — I5030 Unspecified diastolic (congestive) heart failure: Secondary | ICD-10-CM

## 2014-02-26 DIAGNOSIS — R0602 Shortness of breath: Secondary | ICD-10-CM

## 2014-02-26 DIAGNOSIS — I10 Essential (primary) hypertension: Secondary | ICD-10-CM

## 2014-02-26 DIAGNOSIS — I872 Venous insufficiency (chronic) (peripheral): Secondary | ICD-10-CM | POA: Insufficient documentation

## 2014-02-26 LAB — POCT GLYCOSYLATED HEMOGLOBIN (HGB A1C): HEMOGLOBIN A1C: 6.8

## 2014-02-26 MED ORDER — SILVER SULFADIAZINE 1 % EX CREA: 1.0000 "application " | TOPICAL_CREAM | Freq: Every day | CUTANEOUS | Status: DC

## 2014-02-26 NOTE — Progress Notes (Signed)
[name] is a 63 y.o. male who presents to [name] today for routine follow up.  HTN: elevated today. Taking Meds. Denies HA, palpitations, CP, SOB.   DM: Glipizide about 1-2x wkly. avg CBG around low 100s.   Wound care: appt at the wound center next week. Clear yellow drainage.   The following portions of the patient's history were reviewed and updated as appropriate: allergies, current medications, past medical history, family and social history, and problem list.  Patient is a nonsmoker.   Past Medical History  Diagnosis Date  . Diabetes mellitus   . Hypertension   . Hyperlipidemia   . Chronic kidney disease   . PVD (peripheral vascular disease)   . Venous insufficiency   . Morbid obesity   . Chronic cystitis   . Arthritis   . UMBILICAL HERNIA 99991111    Qualifier: History of  By: [name] MD, Audelia Acton    . NEPHROLITHIASIS, HX OF 12/02/2009    Qualifier: Diagnosis of  By: [name], Cat    . Lymphedema     ROS as above otherwise neg.    Medications reviewed. Current Outpatient Prescriptions  Medication Sig Dispense Refill  . amLODipine (NORVASC) 10 MG tablet take 1 tablet by mouth once daily  90 tablet  3  . aspirin 81 MG tablet Take 81 mg by mouth daily.        . carvedilol (COREG) 12.5 MG tablet Take 1 tablet (12.5 mg total) by mouth 2 (two) times daily with a meal.  180 tablet  3  . furosemide (LASIX) 80 MG tablet Take 80 mg by mouth. Taking twice a week      . glipiZIDE (GLUCOTROL) 10 MG tablet Take 1 tablet (10 mg total) by mouth daily before breakfast.  90 tablet  3  . hydrALAZINE (APRESOLINE) 100 MG tablet Take 1 tablet (100 mg total) by mouth 2 (two) times daily. take 1 tablet by mouth twice a day  180 tablet  3  . silver sulfADIAZINE (SILVADENE) 1 % cream Apply 1 application topically daily.  50 g  0  . simvastatin (ZOCOR) 80 MG tablet Take 1 tablet (80 mg total) by mouth daily.  90 tablet  1  . Vitamins-Lipotropics (LIPO-FLAVONOID PLUS PO) Take 1 tablet by mouth 3  (three) times daily.       No current facility-administered medications for this visit.    Exam: BP 174/72  Pulse 58  Temp(Src) 98.5 F (36.9 C) (Oral)  Ht 5\' 11"  (1.803 m)  Wt 296 lb (134.265 kg)  BMI 41.30 kg/m2 Gen: Well NAD HEENT: EOMI,  MMM Exts: 2+ pitting edema w/ venous stasis dermatitis adn skin breakdown w/ clear discharge  Results for orders placed in visit on 02/26/14 (from the past 72 hour(s))  POCT GLYCOSYLATED HEMOGLOBIN (HGB A1C)     Status: None   Collection Time    02/26/14 10:04 AM      Result Value Ref Range   Hemoglobin A1C 6.8      A/P (as seen in Problem list)  HYPERTENSION, BENIGN SYSTEMIC Elevated above goal.  Pt resistant to any increase in medications. Wants to try diet and exercise   VENOUS INSUFFICIENCY, LEGS Continue compression stockings Pt to research silver impregnated stockings  Uninfected L leg wound but high potential for infection Deferring unaboot today Silvadene cream if worsens and will need ABX if becomes severely infected  DIABETES MELLITUS, II, COMPLICATIONS Elevation in A1c likely from not taking glipizide as prescribed Pt to  take Qday so long as sugar is >90 and going to eat that day   CHRONIC KIDNEY DISEASE STAGE III (MODERATE) F/u renal as determined by their office  Shortness of breath improved  Diastolic CHF Echo showing grade II diastolic CHF w/ preserved EF of 65%.

## 2014-02-26 NOTE — Assessment & Plan Note (Signed)
Elevated above goal.  Pt resistant to any increase in medications. Wants to try diet and exercise

## 2014-02-26 NOTE — Assessment & Plan Note (Signed)
Uninfected L leg wound but high potential for infection Deferring unaboot today Silvadene cream if worsens and will need ABX if becomes severely infected

## 2014-02-26 NOTE — Assessment & Plan Note (Signed)
F/u renal as determined by their office

## 2014-02-26 NOTE — Assessment & Plan Note (Addendum)
Continue compression stockings Pt to research silver impregnated stockings

## 2014-02-26 NOTE — Assessment & Plan Note (Signed)
Elevation in A1c likely from not taking glipizide as prescribed Pt to take Qday so long as sugar is >90 and going to eat that day

## 2014-02-26 NOTE — Patient Instructions (Addendum)
We will try to get you in at the wound clinic sooner Please take the glipizide every morning so long as your sugar is greater than 90 and you feel well enough to eat Use the silver nitrate if your leg sores get worse or call me for antibiotics Start water aerobics or cycling

## 2014-02-26 NOTE — Assessment & Plan Note (Signed)
Echo showing grade II diastolic CHF w/ preserved EF of 65%.

## 2014-02-26 NOTE — Assessment & Plan Note (Signed)
improved

## 2014-02-26 NOTE — Telephone Encounter (Signed)
Patient dropped off paper to be filled out for diabetic supplies.  Please call him when completed.

## 2014-02-27 NOTE — Telephone Encounter (Signed)
Placed in MDs box. Fleeger, [name] Dawn  

## 2014-02-28 ENCOUNTER — Ambulatory Visit (INDEPENDENT_AMBULATORY_CARE_PROVIDER_SITE_OTHER): Payer: Medicare Other | Admitting: Family Medicine

## 2014-02-28 VITALS — BP 190/87 | HR 60 | Temp 98.7°F | Ht 71.0 in | Wt 295.0 lb

## 2014-02-28 DIAGNOSIS — I872 Venous insufficiency (chronic) (peripheral): Secondary | ICD-10-CM

## 2014-02-28 DIAGNOSIS — I8310 Varicose veins of unspecified lower extremity with inflammation: Secondary | ICD-10-CM

## 2014-02-28 NOTE — Progress Notes (Signed)
Patient ID: [name]    DOB: 1951-10-01, 63 y.o.   MRN: [id number] --- Subjective:  [name] is a 63 y.o.male with h/o DM2, HTN, chronic diastolic dysfunction grade 2, CKD3 who presents with lower extremity swelling and skin changes.  - he was seen on 3/17 by his PCP for evaluation of stasis dermatitis. Recommended silvadene cream and referral to wound care center. He has appointment for next Wednesday. Since his last visit, he has noticed more discharge on the dressing and some discomfort in his lower extremities 5/10 that is stable in progression. No fevers. No nausea or vomiting.   ROS: see HPI Past Medical History: reviewed and updated medications and allergies. Social History: Tobacco: former smoker  Objective: Filed Vitals:   02/28/14 1505  BP: 190/87  Pulse: 60  Temp: 98.7 F (37.1 C)    Physical Examination:   General appearance - alert, well appearing, and in no distress Lower extremities: 2-3+ pitting edema, chronic venous stasis changes with thickened skin, areas of skin breakdown and serosanguinous discharge, no pus, no warmth, no erythema Dorsalis pedis pulses difficult to palpate

## 2014-02-28 NOTE — Patient Instructions (Signed)
The wound doesn't look infected at this time.  Keep your legs elevated which is the most important part of it.  Also, you can use vaseline but no harsh soaps or detergents.  Apply an ACE wrap with underlying gauze.

## 2014-03-01 NOTE — Assessment & Plan Note (Signed)
No evidence of infection, but significant swelling and venous stasis skin changes with serosanguinous changes.  - do not think una boot placement at this time is warranted since patient is going to be evaluated at the wound care center in less than 1 week.  - emphasized leg elevation  - cover areas of drainage with telfa or gauze and apply ace bandage. This was done in clinic - return to care if worsening pain, if fever, if warmth or increased redness of lower extremities.

## 2014-03-04 ENCOUNTER — Telehealth: Payer: Self-pay | Admitting: Family Medicine

## 2014-03-04 ENCOUNTER — Ambulatory Visit (INDEPENDENT_AMBULATORY_CARE_PROVIDER_SITE_OTHER): Payer: Medicare Other | Admitting: Family Medicine

## 2014-03-04 ENCOUNTER — Encounter: Payer: Self-pay | Admitting: Family Medicine

## 2014-03-04 VITALS — BP 155/56 | HR 61 | Temp 98.1°F | Ht 71.0 in | Wt 302.0 lb

## 2014-03-04 DIAGNOSIS — N183 Chronic kidney disease, stage 3 unspecified: Secondary | ICD-10-CM

## 2014-03-04 DIAGNOSIS — I509 Heart failure, unspecified: Secondary | ICD-10-CM

## 2014-03-04 DIAGNOSIS — I5030 Unspecified diastolic (congestive) heart failure: Secondary | ICD-10-CM

## 2014-03-04 LAB — RENAL FUNCTION PANEL
Albumin: 3 g/dL — ABNORMAL LOW (ref 3.5–5.2)
BUN: 67 mg/dL — AB (ref 6–23)
CHLORIDE: 110 meq/L (ref 96–112)
CO2: 19 mEq/L (ref 19–32)
Calcium: 7.4 mg/dL — ABNORMAL LOW (ref 8.4–10.5)
Creat: 5.95 mg/dL — ABNORMAL HIGH (ref 0.50–1.35)
Glucose, Bld: 123 mg/dL — ABNORMAL HIGH (ref 70–99)
PHOSPHORUS: 6.6 mg/dL — AB (ref 2.3–4.6)
POTASSIUM: 4.8 meq/L (ref 3.5–5.3)
Sodium: 141 mEq/L (ref 135–145)

## 2014-03-04 LAB — CBC
HCT: 25.6 % — ABNORMAL LOW (ref 39.0–52.0)
Hemoglobin: 8.5 g/dL — ABNORMAL LOW (ref 13.0–17.0)
MCH: 27.3 pg (ref 26.0–34.0)
MCHC: 33.2 g/dL (ref 30.0–36.0)
MCV: 82.3 fL (ref 78.0–100.0)
PLATELETS: 253 10*3/uL (ref 150–400)
RBC: 3.11 MIL/uL — AB (ref 4.22–5.81)
RDW: 14.8 % (ref 11.5–15.5)
WBC: 12.2 10*3/uL — AB (ref 4.0–10.5)

## 2014-03-04 LAB — MAGNESIUM: MAGNESIUM: 1.7 mg/dL (ref 1.5–2.5)

## 2014-03-04 NOTE — Telephone Encounter (Signed)
Returned call to pt.  Pt stated he has had abnormal breathing x 1 week.  He can't walk 50 ft before getting SOB.  Appt made for today 03/04/2014 at 10 AM.  Derl Barrow, RN

## 2014-03-04 NOTE — Assessment & Plan Note (Signed)
Creatinine has acutely worsened over the past 1 yr it appears.  Will obtain renal panel today, especially in setting of diastolic CHF exacerbation.  Emphasize to pt he needs to f/u with renal for optimization of his renal medical disease, especially in the setting of DM/CHF/CKD.  Pt not on ACE at this point  however, REIN trial does state benefit in advanced CKD so would consider starting pt on one, especially with his co-morbidities, and close following of his electrolytes/kidney function.

## 2014-03-04 NOTE — Assessment & Plan Note (Signed)
Pt up 7 lbs over the past week, will increase his lasix 80 mg to BID as pt states he is having effect from medication.  Obtain renal panel today, CBC, BNP (with CKD, will not add much unless severely elevated).  Recommend daily weights, decreased Na intake, f/u in 2-3 days.

## 2014-03-04 NOTE — Telephone Encounter (Signed)
Pt is having trouble breathing. He is out of breath at the slightest bit of exertion. He is very concerned.  He needs some help soon.  This is not normal He is a diabetic. Please advise

## 2014-03-04 NOTE — Progress Notes (Signed)
[name] is a 63 y.o. male who presents today for worsening dyspnea on exertion over the past one week.  Pt with recent echo in 11/19/13 showing grade II diastolic CHF, on Lasix 80 mg qd.  States he does have PND with 2-3 pillow orthopnea and this has not changed in the past week.  Does notice he can only ambulate about 50 steps or one level of stairs before dyspnea starts, which is an acute decline from usually about 100-150 steps and 2 level of stairs.  States compliance with medications, denies fever, chills, CP, pleuritic CP, worsening LE edema, radiating CP, blurred vision, HA.  Is c/o tinnitus which is known ADR of Lasix.   Past Medical History  Diagnosis Date  . Diabetes mellitus   . Hypertension   . Hyperlipidemia   . Chronic kidney disease   . PVD (peripheral vascular disease)   . Venous insufficiency   . Morbid obesity   . Chronic cystitis   . Arthritis   . UMBILICAL HERNIA [id number]    Qualifier: History of  By: [name] MD, Audelia Acton    . NEPHROLITHIASIS, HX OF 12/02/2009    Qualifier: Diagnosis of  By: [name], Cat    . Lymphedema     History  Smoking status  . Former Smoker  . Quit date: 06/06/1979  Smokeless tobacco  . Not on file    Family History  Problem Relation Age of Onset  . Diabetes Mother   . Diabetes Brother     Current Outpatient Prescriptions on File Prior to Visit  Medication Sig Dispense Refill  . amLODipine (NORVASC) 10 MG tablet take 1 tablet by mouth once daily  90 tablet  3  . aspirin 81 MG tablet Take 81 mg by mouth daily.        . carvedilol (COREG) 12.5 MG tablet Take 1 tablet (12.5 mg total) by mouth 2 (two) times daily with a meal.  180 tablet  3  . furosemide (LASIX) 80 MG tablet Take 80 mg by mouth. Taking twice a week      . glipiZIDE (GLUCOTROL) 10 MG tablet Take 1 tablet (10 mg total) by mouth daily before breakfast.  90 tablet  3  . hydrALAZINE (APRESOLINE) 100 MG tablet Take 1 tablet (100 mg total) by mouth 2 (two) times daily.  take 1 tablet by mouth twice a day  180 tablet  3  . silver sulfADIAZINE (SILVADENE) 1 % cream Apply 1 application topically daily.  50 g  0  . simvastatin (ZOCOR) 80 MG tablet Take 1 tablet (80 mg total) by mouth daily.  90 tablet  1  . Vitamins-Lipotropics (LIPO-FLAVONOID PLUS PO) Take 1 tablet by mouth 3 (three) times daily.       No current facility-administered medications on file prior to visit.    ROS: Per HPI.  All other systems reviewed and are negative.   Physical Exam Filed Vitals:   03/04/14 1013  BP: 155/56  Pulse: 61  Temp: 98.1 F (36.7 C)   Pulse oximetry on room air is 100%.  Physical Examination: General appearance - alert, well appearing, and in no distress Neck - supple, no significant adenopathy, minimal JVD B/L (5cm) Chest - Bibasilar crackles, no increased WOB Heart - normal rate, regular rhythm, normal S1, S2, no murmurs, rubs, clicks or gallops Extremities - In [employer] B/L, pedal edema +2 above this B/L, popliteal pulses +2 B/L     Chemistry      Component  Value Date/Time   NA 139 10/14/2013 1239   K 4.7 10/14/2013 1239   CL 109 10/14/2013 1239   CO2 23 10/14/2013 1239   BUN 56* 10/14/2013 1239   CREATININE 3.97* 10/14/2013 1239   CREATININE 4.49* 08/19/2013 0005      Component Value Date/Time   CALCIUM 8.6 10/14/2013 1239   CALCIUM 9.0 05/18/2007 2215   ALKPHOS 72 10/14/2013 1239   AST 12 10/14/2013 1239   ALT 11 10/14/2013 1239   BILITOT 0.2* 10/14/2013 1239     BNP    Component Value Date/Time   PROBNP 740.9* 08/19/2013 0005     Greater than 50% of the 25 minute visit was spent face to face counseling the pt and discussing moderate medical decision making of his condition.

## 2014-03-04 NOTE — Patient Instructions (Signed)
Mr. Dolman, it was nice seeing you today.  Please get your labs today and start taking your lasix 80 mg pills, two times per day.  Please weigh yourself daily as well and we will see you back at the end of the week.    Thanks, Dr. Awanda Mink

## 2014-03-05 ENCOUNTER — Encounter (HOSPITAL_BASED_OUTPATIENT_CLINIC_OR_DEPARTMENT_OTHER): Payer: Medicare Other | Attending: General Surgery

## 2014-03-05 DIAGNOSIS — N189 Chronic kidney disease, unspecified: Secondary | ICD-10-CM | POA: Insufficient documentation

## 2014-03-05 DIAGNOSIS — I129 Hypertensive chronic kidney disease with stage 1 through stage 4 chronic kidney disease, or unspecified chronic kidney disease: Secondary | ICD-10-CM | POA: Insufficient documentation

## 2014-03-05 DIAGNOSIS — L97809 Non-pressure chronic ulcer of other part of unspecified lower leg with unspecified severity: Secondary | ICD-10-CM | POA: Insufficient documentation

## 2014-03-05 DIAGNOSIS — E119 Type 2 diabetes mellitus without complications: Secondary | ICD-10-CM | POA: Insufficient documentation

## 2014-03-05 LAB — PRO B NATRIURETIC PEPTIDE: Pro B Natriuretic peptide (BNP): 1604 pg/mL — ABNORMAL HIGH (ref <126)

## 2014-03-07 ENCOUNTER — Inpatient Hospital Stay (HOSPITAL_COMMUNITY)
Admission: EM | Admit: 2014-03-07 | Discharge: 2014-03-11 | DRG: 292 | Disposition: A | Discharge status: Home / Self Care | Payer: Medicare Other | Attending: Family Medicine | Admitting: Family Medicine

## 2014-03-07 ENCOUNTER — Ambulatory Visit (INDEPENDENT_AMBULATORY_CARE_PROVIDER_SITE_OTHER): Payer: Medicare Other | Admitting: Physician Assistant

## 2014-03-07 ENCOUNTER — Encounter: Payer: Self-pay | Admitting: Physician Assistant

## 2014-03-07 ENCOUNTER — Encounter (HOSPITAL_COMMUNITY): Payer: Self-pay | Admitting: Emergency Medicine

## 2014-03-07 ENCOUNTER — Emergency Department (HOSPITAL_COMMUNITY): Payer: Medicare Other

## 2014-03-07 ENCOUNTER — Other Ambulatory Visit: Payer: Self-pay

## 2014-03-07 VITALS — BP 170/88 | HR 66 | Ht 71.0 in | Wt 303.0 lb

## 2014-03-07 DIAGNOSIS — IMO0002 Reserved for concepts with insufficient information to code with codable children: Secondary | ICD-10-CM

## 2014-03-07 DIAGNOSIS — K219 Gastro-esophageal reflux disease without esophagitis: Secondary | ICD-10-CM | POA: Diagnosis present

## 2014-03-07 DIAGNOSIS — I10 Essential (primary) hypertension: Secondary | ICD-10-CM

## 2014-03-07 DIAGNOSIS — I739 Peripheral vascular disease, unspecified: Secondary | ICD-10-CM | POA: Diagnosis present

## 2014-03-07 DIAGNOSIS — I890 Lymphedema, not elsewhere classified: Secondary | ICD-10-CM

## 2014-03-07 DIAGNOSIS — Z888 Allergy status to other drugs, medicaments and biological substances status: Secondary | ICD-10-CM

## 2014-03-07 DIAGNOSIS — E785 Hyperlipidemia, unspecified: Secondary | ICD-10-CM | POA: Diagnosis present

## 2014-03-07 DIAGNOSIS — N179 Acute kidney failure, unspecified: Secondary | ICD-10-CM | POA: Diagnosis present

## 2014-03-07 DIAGNOSIS — D631 Anemia in chronic kidney disease: Secondary | ICD-10-CM | POA: Diagnosis present

## 2014-03-07 DIAGNOSIS — E118 Type 2 diabetes mellitus with unspecified complications: Secondary | ICD-10-CM

## 2014-03-07 DIAGNOSIS — N185 Chronic kidney disease, stage 5: Secondary | ICD-10-CM

## 2014-03-07 DIAGNOSIS — E1169 Type 2 diabetes mellitus with other specified complication: Secondary | ICD-10-CM | POA: Diagnosis present

## 2014-03-07 DIAGNOSIS — Z91013 Allergy to seafood: Secondary | ICD-10-CM

## 2014-03-07 DIAGNOSIS — D472 Monoclonal gammopathy: Secondary | ICD-10-CM

## 2014-03-07 DIAGNOSIS — I872 Venous insufficiency (chronic) (peripheral): Secondary | ICD-10-CM | POA: Diagnosis present

## 2014-03-07 DIAGNOSIS — I5030 Unspecified diastolic (congestive) heart failure: Secondary | ICD-10-CM

## 2014-03-07 DIAGNOSIS — N183 Chronic kidney disease, stage 3 unspecified: Secondary | ICD-10-CM | POA: Diagnosis present

## 2014-03-07 DIAGNOSIS — N189 Chronic kidney disease, unspecified: Secondary | ICD-10-CM

## 2014-03-07 DIAGNOSIS — I5033 Acute on chronic diastolic (congestive) heart failure: Secondary | ICD-10-CM

## 2014-03-07 DIAGNOSIS — Z833 Family history of diabetes mellitus: Secondary | ICD-10-CM

## 2014-03-07 DIAGNOSIS — N039 Chronic nephritic syndrome with unspecified morphologic changes: Secondary | ICD-10-CM

## 2014-03-07 DIAGNOSIS — I129 Hypertensive chronic kidney disease with stage 1 through stage 4 chronic kidney disease, or unspecified chronic kidney disease: Secondary | ICD-10-CM | POA: Diagnosis present

## 2014-03-07 DIAGNOSIS — M129 Arthropathy, unspecified: Secondary | ICD-10-CM | POA: Diagnosis present

## 2014-03-07 DIAGNOSIS — Z6838 Body mass index (BMI) 38.0-38.9, adult: Secondary | ICD-10-CM

## 2014-03-07 DIAGNOSIS — I509 Heart failure, unspecified: Secondary | ICD-10-CM

## 2014-03-07 DIAGNOSIS — N184 Chronic kidney disease, stage 4 (severe): Secondary | ICD-10-CM

## 2014-03-07 DIAGNOSIS — Z6841 Body Mass Index (BMI) 40.0 and over, adult: Secondary | ICD-10-CM

## 2014-03-07 DIAGNOSIS — E1165 Type 2 diabetes mellitus with hyperglycemia: Secondary | ICD-10-CM

## 2014-03-07 DIAGNOSIS — Z87891 Personal history of nicotine dependence: Secondary | ICD-10-CM

## 2014-03-07 LAB — URINALYSIS, ROUTINE W REFLEX MICROSCOPIC
Bilirubin Urine: NEGATIVE
GLUCOSE, UA: 100 mg/dL — AB
KETONES UR: NEGATIVE mg/dL
Leukocytes, UA: NEGATIVE
NITRITE: NEGATIVE
PH: 6 (ref 5.0–8.0)
Protein, ur: 100 mg/dL — AB
Specific Gravity, Urine: 1.013 (ref 1.005–1.030)
Urobilinogen, UA: 0.2 mg/dL (ref 0.0–1.0)

## 2014-03-07 LAB — BASIC METABOLIC PANEL
BUN: 72 mg/dL — ABNORMAL HIGH (ref 6–23)
CO2: 20 mEq/L (ref 19–32)
Calcium: 7.7 mg/dL — ABNORMAL LOW (ref 8.4–10.5)
Chloride: 103 mEq/L (ref 96–112)
Creatinine, Ser: 6.2 mg/dL — ABNORMAL HIGH (ref 0.50–1.35)
GFR calc Af Amer: 10 mL/min — ABNORMAL LOW (ref >90)
GFR, EST NON AFRICAN AMERICAN: 9 mL/min — AB (ref >90)
GLUCOSE: 168 mg/dL — AB (ref 70–99)
Potassium: 4.5 mEq/L (ref 3.7–5.3)
SODIUM: 139 meq/L (ref 137–147)

## 2014-03-07 LAB — CBC
HCT: 25.8 % — ABNORMAL LOW (ref 39.0–52.0)
HCT: 26.4 % — ABNORMAL LOW (ref 39.0–52.0)
Hemoglobin: 8.5 g/dL — ABNORMAL LOW (ref 13.0–17.0)
Hemoglobin: 8.8 g/dL — ABNORMAL LOW (ref 13.0–17.0)
MCH: 27.8 pg (ref 26.0–34.0)
MCH: 27.8 pg (ref 26.0–34.0)
MCHC: 32.9 g/dL (ref 30.0–36.0)
MCHC: 33.3 g/dL (ref 30.0–36.0)
MCV: 84.3 fL (ref 78.0–100.0)
MCV: 83.5 fL (ref 78.0–100.0)
Platelets: 221 K/uL (ref 150–400)
Platelets: 240 10*3/uL (ref 150–400)
RBC: 3.06 MIL/uL — ABNORMAL LOW (ref 4.22–5.81)
RBC: 3.16 MIL/uL — AB (ref 4.22–5.81)
RDW: 14.6 % (ref 11.5–15.5)
RDW: 14.4 % (ref 11.5–15.5)
WBC: 12 K/uL — ABNORMAL HIGH (ref 4.0–10.5)
WBC: 12.7 10*3/uL — AB (ref 4.0–10.5)

## 2014-03-07 LAB — CREATININE, SERUM
CREATININE: 5.99 mg/dL — AB (ref 0.50–1.35)
GFR, EST AFRICAN AMERICAN: 10 mL/min — AB (ref >90)
GFR, EST NON AFRICAN AMERICAN: 9 mL/min — AB (ref >90)

## 2014-03-07 LAB — URINE MICROSCOPIC-ADD ON

## 2014-03-07 LAB — GLUCOSE, CAPILLARY
GLUCOSE-CAPILLARY: 73 mg/dL (ref 70–99)
GLUCOSE-CAPILLARY: 108 mg/dL — AB (ref 70–99)

## 2014-03-07 LAB — TROPONIN I

## 2014-03-07 LAB — PRO B NATRIURETIC PEPTIDE: Pro B Natriuretic peptide (BNP): 1074 pg/mL — ABNORMAL HIGH (ref 0–125)

## 2014-03-07 LAB — I-STAT TROPONIN, ED: Troponin i, poc: 0.02 ng/mL (ref 0.00–0.08)

## 2014-03-07 MED ORDER — LEVOFLOXACIN IN D5W 750 MG/150ML IV SOLN
750.0000 mg | Freq: Once | INTRAVENOUS | Status: AC
  Administered 2014-03-07: 750 mg via INTRAVENOUS
  Filled 2014-03-07: qty 150

## 2014-03-07 MED ORDER — FUROSEMIDE 10 MG/ML IJ SOLN: 100.0000 mg | Freq: Once | INTRAMUSCULAR | Status: DC

## 2014-03-07 MED ORDER — FUROSEMIDE 10 MG/ML IJ SOLN
160.0000 mg | Freq: Three times a day (TID) | INTRAVENOUS | Status: DC
  Administered 2014-03-07 – 2014-03-11 (×11): 160 mg via INTRAVENOUS
  Filled 2014-03-07 (×13): qty 16

## 2014-03-07 MED ORDER — ASPIRIN 81 MG PO CHEW
81.0000 mg | CHEWABLE_TABLET | Freq: Every day | ORAL | Status: DC
  Administered 2014-03-07 – 2014-03-11 (×5): 81 mg via ORAL
  Filled 2014-03-07 (×2): qty 1

## 2014-03-07 MED ORDER — HYDRALAZINE HCL 20 MG/ML IJ SOLN
10.0000 mg | Freq: Four times a day (QID) | INTRAMUSCULAR | Status: DC | PRN
  Administered 2014-03-07: 10 mg via INTRAVENOUS
  Filled 2014-03-07: qty 1

## 2014-03-07 MED ORDER — CALCIUM ACETATE 667 MG PO CAPS
667.0000 mg | ORAL_CAPSULE | Freq: Three times a day (TID) | ORAL | Status: DC
  Administered 2014-03-07 – 2014-03-08 (×2): 667 mg via ORAL
  Filled 2014-03-07 (×5): qty 1

## 2014-03-07 MED ORDER — SODIUM CHLORIDE 0.9 % IJ SOLN
3.0000 mL | Freq: Two times a day (BID) | INTRAMUSCULAR | Status: DC
  Administered 2014-03-08 – 2014-03-10 (×6): 3 mL via INTRAVENOUS

## 2014-03-07 MED ORDER — HEPARIN SODIUM (PORCINE) 5000 UNIT/ML IJ SOLN
5000.0000 [IU] | Freq: Three times a day (TID) | INTRAMUSCULAR | Status: DC
  Administered 2014-03-07 – 2014-03-11 (×13): 5000 [IU] via SUBCUTANEOUS
  Filled 2014-03-07 (×16): qty 1

## 2014-03-07 MED ORDER — FUROSEMIDE 10 MG/ML IJ SOLN
100.0000 mg | Freq: Once | INTRAVENOUS | Status: AC
  Administered 2014-03-07: 100 mg via INTRAVENOUS
  Filled 2014-03-07: qty 10

## 2014-03-07 MED ORDER — SODIUM CHLORIDE 0.9 % IV SOLN
250.0000 mL | INTRAVENOUS | Status: DC | PRN
  Administered 2014-03-07: 250 mL via INTRAVENOUS

## 2014-03-07 MED ORDER — HYDRALAZINE HCL 100 MG PO TABS
100.0000 mg | ORAL_TABLET | Freq: Two times a day (BID) | ORAL | Status: DC
  Administered 2014-03-08 – 2014-03-11 (×7): 100 mg via ORAL
  Filled 2014-03-07 (×9): qty 1

## 2014-03-07 MED ORDER — DARBEPOETIN ALFA-POLYSORBATE 100 MCG/0.5ML IJ SOLN
100.0000 ug | INTRAMUSCULAR | Status: DC
  Filled 2014-03-07 (×2): qty 0.5

## 2014-03-07 MED ORDER — ATORVASTATIN CALCIUM 40 MG PO TABS
40.0000 mg | ORAL_TABLET | Freq: Every day | ORAL | Status: DC
  Filled 2014-03-07: qty 1

## 2014-03-07 MED ORDER — ASPIRIN 81 MG PO TABS
81.0000 mg | ORAL_TABLET | Freq: Every day | ORAL | Status: DC
  Filled 2014-03-07 (×3): qty 1

## 2014-03-07 MED ORDER — SODIUM CHLORIDE 0.9 % IJ SOLN
3.0000 mL | INTRAMUSCULAR | Status: DC | PRN
  Administered 2014-03-11 (×2): 3 mL via INTRAVENOUS

## 2014-03-07 MED ORDER — CARVEDILOL 12.5 MG PO TABS
12.5000 mg | ORAL_TABLET | Freq: Two times a day (BID) | ORAL | Status: DC
  Administered 2014-03-07 – 2014-03-09 (×4): 12.5 mg via ORAL
  Filled 2014-03-07 (×6): qty 1

## 2014-03-07 MED ORDER — AMLODIPINE BESYLATE 10 MG PO TABS
10.0000 mg | ORAL_TABLET | Freq: Every day | ORAL | Status: DC
  Administered 2014-03-08: 10 mg via ORAL
  Filled 2014-03-07 (×2): qty 1

## 2014-03-07 MED ORDER — FUROSEMIDE 10 MG/ML IJ SOLN
20.0000 mg | Freq: Once | INTRAMUSCULAR | Status: AC
  Administered 2014-03-07: 20 mg via INTRAVENOUS
  Filled 2014-03-07: qty 2

## 2014-03-07 MED ORDER — SODIUM CHLORIDE 0.9 % IJ SOLN
3.0000 mL | Freq: Two times a day (BID) | INTRAMUSCULAR | Status: DC
  Administered 2014-03-07 – 2014-03-09 (×5): 3 mL via INTRAVENOUS

## 2014-03-07 MED ORDER — INSULIN ASPART 100 UNIT/ML ~~LOC~~ SOLN
0.0000 [IU] | Freq: Three times a day (TID) | SUBCUTANEOUS | Status: DC
  Administered 2014-03-08: 3 [IU] via SUBCUTANEOUS
  Administered 2014-03-08: 1 [IU] via SUBCUTANEOUS
  Administered 2014-03-09: 2 [IU] via SUBCUTANEOUS
  Administered 2014-03-09: 1 [IU] via SUBCUTANEOUS
  Administered 2014-03-09: 2 [IU] via SUBCUTANEOUS
  Administered 2014-03-10: 1 [IU] via SUBCUTANEOUS
  Administered 2014-03-10: 2 [IU] via SUBCUTANEOUS
  Administered 2014-03-10: 1 [IU] via SUBCUTANEOUS
  Administered 2014-03-11: 2 [IU] via SUBCUTANEOUS
  Administered 2014-03-11 (×2): 1 [IU] via SUBCUTANEOUS

## 2014-03-07 MED ORDER — SIMVASTATIN 80 MG PO TABS
80.0000 mg | ORAL_TABLET | Freq: Every day | ORAL | Status: DC
  Administered 2014-03-07: 80 mg via ORAL
  Filled 2014-03-07 (×2): qty 1

## 2014-03-07 NOTE — ED Notes (Signed)
Pt reports hes had sob for past 2 weeks. He went [place] [facility] for eval today and they wanted him to come be admitted "because i had protein in my urine and they said my heart and kidneys arent working together." he denies pain. He is a&ox4, able to speak in full sentences.

## 2014-03-07 NOTE — H&P (Signed)
The co sign of the H&PE is my normal chart addition.  Not yet ready for co sign so I will add this brief note.  Seen and examined.  Discussed with Dr. Lamar Benes and T.  Agree with their documentation and management.  Briefly.  Mr. Malick presents with dyspnea and is found to have a related constellation of problems. 1. Acute on Chronic renal failure.  With a Creat= 6.0, he will have entered CKD stage 4-5 No uremic symptoms.  He is volume overloaded and I am optimistic that he will respond to diuresis.  We should save an arm for dialysis and should check phos and do typical dietary restrictions and phosphate binders. 2. Hypertensive urgency.  Some may be volume related.  Clearly this too, needs to come down. 3. Acute on chronic diastolic CHF.  Volume overloaded.  Both problems hypertension and worsening renal failure contribute.   4. Anemia - likely anemia of chronic disease.  Might benefit from epo and iron.

## 2014-03-07 NOTE — H&P (Signed)
[facility] Admission History and Physical Service Pager: [phone]  Patient name: [name] Medical record number: [id number] Date of birth: 1951-04-17 Age: 63 y.o. Gender: male  Primary Care Provider: Linna Darner, MD Consultants: Cardiology, Nephrology Code Status: Full  Chief Complaint: shortness of breath  Assessment and Plan: [name] is a 63 y.o. male presenting with shortness of breath, suspected CHF exacerbation. PMH is significant for Diastolic CHF, HTN, PVD, 123456, CKD, MGUS, obesity.  # Dyspnea: Likely due to acute on chronic diastolic CHF: Echo 123XX123 EF 60-65% with grade II diastolic dysfcn, Myoview [id number]. No O2 requirement. BNP down 1604 to 1074 over past 3 days. Electrolytes normal, BUN 72 Cr 6.2 (see below). i-stat 0.02. EKG NSR. CXR with no pulmonary edema but does have mild LLL atelectasis vs infiltrate and small left effusion. Overall appears comfortable currently. Received levaquin in ED but no obvious signs of pna (nl WBC, no fever, no cough) so will not continue. - admit to inpatient on tele, attending Dr. Andria Frames - continuous cardiac/pulse ox monitoring - received lasix 120mg  IV. Will add additional 160mg  IV TID per renal recs. - additional labs = TSH, PTH, Iron panel, ferritin, serum troponin x 1 - AM labs = renal function panel - EKG in AM - PT/OT eval/treat - Daily wt/strict ins and outs  # Hypertensive urgency: BP in ED 180-190s/70s. Pt did not take BP meds today. Also likely related to fluid overload. - 10mg  hydralazine IV PRN q6hrs for SBP >180, DBP >110 - continue home meds = coreg 12.5mg  bid, amlodipine 10mg  - Lasix 100mg  IV x 1 now. - Per renal recs, can continue 160mg  TID.  # CKD: worsening creatinine throughout 2014. 3d ago 5.95 up to 6.20 on admission. Previously seen nephrologist but not in "a while." Previous physician Dr Florene Glen. Never on HD.  - consult to nephrology, appreciate recs: at this time could  increase lasix up to 160mg . Checking renal US, SPEP. Will consider fistula and possible HD this hospitalization. - additional labs now = TSH, PTH, iron panel, ferritin, and start aranesp. Checking Ca, Mg, Phos and will start phoslo (corrected Ca 8.5, previous phos high).  - continue to trend creatinine with renal function panel and f/u UOP. - Order placed to "save left arm"  # History of ?MGUS: Pt unaware of dx. Never followed up with hematologist. Workup appears to have been done in 2009-11: SPEP with monoclonal IgG kappa protein. WBC appears to be chronically mildly elevated, baseline 12, currently 12.0. - may consider consult to heme/onc vs outpatient f/u.  # T2DM: A1c 6.8 in 02/26/14. On glipizide at home. Reports hypoglycemia this morning but bmet with glucose 168 - hold glipizide - SSI  # HLD: - pt prefers home zocor to lipitor.  # Chronic venous insufficiency: Unna boot put on Wednesday with plan for change weekly - wound care for unna boot q week  # Urinary findings: UA with many bact and 7-10 wbc; no reported symptoms or fever. Stable mild leukocytosis.  - Culture - Will not treat unless becomes symptomatic.  FEN/GI: saline lock, renal and carb mod diet Prophylaxis: heparin  Disposition: admit to med-surg on tele  History of Present Illness: [name] is a 63 y.o. male presenting with shortness of breath, sent from cardiology office visit. Denies CP. Primarily SOB while up and moving around, comfortable while sitting. No PND but sits up to sleep (from fear of orthopnea years ago, not from current orthopnea). Leg swelling  is worse over the past few weeks. He is urinating same amount as previously, no difficulty initiating, no changes in color. He is taking his meds as prescribed (except for this morning) and stays hydrated. He denies missing any doses of lasix over the past 2 weeks. He used to take it twice weekly but this week started taking it BID. He has an occasional dry  cough that is chronic. Had right "side" pain from fall last "Sunday which has currently resolved. He does endorse some low back pain when bending over that has occurred since that fall. He denies fevers, nausea/vomiting, dysuria, hematuria, blood in stool, has had some chills that he attributes to hypoglycemia (says he was 68 this morning, and therefore didn't take his BP meds).  In the ED, he received lasix 20mg IV and levaquin 750mg IV.  Review Of Systems: Per HPI with the following additions: tinnitus for past year in left ear. Otherwise 12 point review of systems was performed and was unremarkable.  Patient Active Problem List   Diagnosis Date Noted  . Acute exacerbation of CHF (congestive heart failure) 03/07/2014  . Stasis dermatitis 02/26/2014  . Diastolic CHF 10/30/2013  . Right leg swelling 08/19/2013  . Special screening for malignant neoplasms, colon 07/09/2013  . Knee pain 08/21/2012  . Mass of thigh 08/21/2012  . ORGANIC IMPOTENCE 12/02/2009  . ONYCHOMYCOSIS 07/17/2009  . CHRONIC KIDNEY DISEASE STAGE III (MODERATE) 06/09/2008  . VENOUS INSUFFICIENCY, LEGS 04/13/2007  . Hyperlipidemia LDL goal < 100 03/19/2007  . DIABETES MELLITUS, II, COMPLICATIONS 02/08/2007  . OBESITY, MORBID 02/08/2007  . HYPERTENSION, BENIGN SYSTEMIC 02/08/2007  . PERIPHERAL VASCULAR DISEASE, UNSPEC. 02/08/2007   Past Medical History: Past Medical History  Diagnosis Date  . Diabetes mellitus   . Hypertension   . Hyperlipidemia   . Chronic kidney disease   . PVD (peripheral vascular disease)   . Venous insufficiency   . Morbid obesity   . Chronic cystitis   . Arthritis   . UMBILICAL HERNIA 02/08/2007    Qualifier: History of  By: Hudnall MD, Shane    . NEPHROLITHIASIS, HX OF 12/02/2009    Qualifier: Diagnosis of  By: [name], Cat    . Lymphedema    Past Surgical History: Past Surgical History  Procedure Laterality Date  . R knee arthoscopic repair of meniscus    . Umbilical hernia repair     . Amputation      Right and left fifth toes.   . Popliteal to posterior tibial bypass      20" 06   Social History: History  Substance Use Topics  . Smoking status: Former Smoker    Quit date: 06/06/1979  . Smokeless tobacco: Not on file  . Alcohol Use: No     Comment: occasional  Quit 35 years ago after smoking 15 years. Used to drink Th-Sun back in 80s to 'get mellow,' stopped in 90s No drug use  Additional social history: lives alone, has girlfriend Please also refer to relevant sections of EMR.  Family History: Family History  Problem Relation Age of Onset  . Diabetes Mother   . Diabetes Brother    Allergies and Medications: Allergies  Allergen Reactions  . Shrimp [Shellfish Allergy] Swelling  . Iodine Swelling  No previous reaction to MRI.  No current facility-administered medications on file prior to encounter.   Current Outpatient Prescriptions on File Prior to Encounter  Medication Sig Dispense Refill  . amLODipine (NORVASC) 10 MG tablet take 1 tablet by  mouth once daily  90 tablet  3  . aspirin 81 MG tablet Take 81 mg by mouth daily.        . carvedilol (COREG) 12.5 MG tablet Take 1 tablet (12.5 mg total) by mouth 2 (two) times daily with a meal.  180 tablet  3  . furosemide (LASIX) 80 MG tablet Take 80 mg by mouth 2 (two) times daily. Taking twice a week      . hydrALAZINE (APRESOLINE) 100 MG tablet Take 1 tablet (100 mg total) by mouth 2 (two) times daily. take 1 tablet by mouth twice a day  180 tablet  3  . simvastatin (ZOCOR) 80 MG tablet Take 1 tablet (80 mg total) by mouth daily.  90 tablet  1  Glipizide 12.5 or 10mg  daily if needed (hold for <98). This week been taking lasix BID.  Objective: BP 185/86  Pulse 63  Temp(Src) 97.7 F (36.5 C) (Oral)  Resp 20  Ht 5\' 11"  (1.803 m)  Wt 296 lb 9.6 oz (134.537 kg)  BMI 41.39 kg/m2  SpO2 96% Exam: General: NAD HEENT: Right pupil fixed (previous cataract surgery, visible debris behind iris), left pupil  reactive with visible debris behind iris as well, EOMI. MMM, no oropharyngeal erythema Cardiovascular: RRR, normal s1/s2, no murmurs appreciated Respiratory: somewhat diminished breath sounds, but clear bilaterally. No crackles appreciated. Normal effort. Abdomen: soft, obese, nontender, nondistended Extremities: 3+ edema bilaterally appreciated through unna boot. No palpable pulses through boots, but WWP. Skin: no rashes appreciated Neuro: alert and oriented, no focal deficits  Labs and Imaging: CBC BMET   Recent Labs Lab 03/07/14 1110  WBC 12.0*  HGB 8.5*  HCT 25.8*  PLT 221    Recent Labs Lab 03/07/14 1110  NA 139  K 4.5  CL 103  CO2 20  BUN 72*  CREATININE 6.20*  GLUCOSE 168*  CALCIUM 7.7*     Dg Chest 2 View  03/07/2014   CLINICAL DATA:  Short of breath  EXAM: CHEST  2 VIEW  COMPARISON:  08/19/2013  FINDINGS: Heart size and vascularity are normal.  Mild left lower lobe airspace disease and small left effusion. This may represent atelectasis or possibly pneumonia.  IMPRESSION: Mild left lower lobe atelectasis/ infiltrate and small left effusion.   Electronically Signed   By: Franchot Gallo M.D.   On: 03/07/2014 11:46    Tawanna Sat, MD 03/07/2014, 1:13 PM PGY-1, [facility] pager: [phone], text pages welcome   I have seen and examined patient with Dr Lamar Benes and agree with his assessment and plan with my additions above in blue. Hilton Sinclair, MD PGY-2, [place]

## 2014-03-07 NOTE — Patient Instructions (Signed)
You are being sent to ED to be admitted by your Primary care office   Your physician recommends that you continue on your current medications as directed. Please refer to the Current Medication list given to you today.

## 2014-03-07 NOTE — Progress Notes (Addendum)
Patient arrived on unit from ED, vital signs stable.  Girlfriend at bedside.  Will continue to monitor.

## 2014-03-07 NOTE — Consult Note (Signed)
[facility] Renal Consultation Note  Requesting MD: Hensel Indication for Consultation: Likely progressive CKD  HPI:  [name] is a 63 y.o. male with HTN, DM, obesity, MGUS, lymphedema as well as a long standing history of CKD dating back to 2008- the overall trend has been for the worse.  He did see Dr. Florene Glen in the office but it has been several years since he was seen.  His creatinine in 2014 was in the 3.5 to 4 range indicating severe CKD.  He now presents with symptoms of volume overoload and creatinine is over 6.  He admits that his diseases have not been under the best control.  He thinks his regular weight is 292 which is where it is today.  He also states that he bought some ibuprofen and had been taking it for a toothache within the past month.  He is not on an ACE-I and I do not identify any other nephrotoxins.  He said that the lasix was giving him cramps so I imagine that maybe he has not been compliant with the lasix. The cramping could also just be a symptom of his CKD.  He does deny however, any change in appetite or energy level. His U/A is not that remarkable   Creat  Date/Time Value Ref Range Status  03/04/2014 11:13 AM 5.95* 0.50 - 1.35 mg/dL Final  10/14/2013 12:39 PM 3.97* 0.50 - 1.35 mg/dL Final  07/09/2013  3:07 PM 3.65* 0.50 - 1.35 mg/dL Final  08/16/2012  3:17 PM 2.20* 0.50 - 1.35 mg/dL Final  06/20/2011  4:16 PM 2.38* 0.50 - 1.35 mg/dL Final     Result repeated and verified.  03/15/2011  3:20 AM 2.14* 0.40 - 1.50 mg/dL Final     Result repeated and verified.     Creatinine, Ser  Date/Time Value Ref Range Status  03/07/2014 11:10 AM 6.20* 0.50 - 1.35 mg/dL Final  08/19/2013 12:05 AM 4.49* 0.50 - 1.35 mg/dL Final  09/21/2011 11:01 AM 2.35* 0.50 - 1.35 mg/dL Final  06/28/2011  6:40 AM 2.00* 0.50 - 1.35 mg/dL Final  06/25/2011  5:00 AM 1.71* 0.50 - 1.35 mg/dL Final     **Please note change in reference range.**  06/24/2011  1:47 PM 1.91* 0.50 - 1.35 mg/dL  Final     **Please note change in reference range.**  12/28/2010  3:30 PM 1.65* 0.4 - 1.5 mg/dL Final  07/08/2010 10:30 PM 1.40  0.40-1.50 mg/dL Final  04/14/2010  8:18 PM 1.81* 0.40-1.50 mg/dL Final  02/22/2010  3:52 PM 1.88* 0.40 - 1.50 mg/dL Final  10/30/2009 12:16 PM 2.04* 0.40 - 1.50 mg/dL Final     Result repeated and verified.  08/19/2009  8:41 PM 1.98* 0.40-1.50 mg/dL Final     See lab report for associated comment(s)  05/20/2009 10:07 PM 2.19* 0.40-1.50 mg/dL Final     See lab report for associated comment(s)  03/25/2009  3:51 PM 1.63* 0.40 - 1.50 mg/dL Final  10/16/2008  2:25 PM 1.67* 0.40 - 1.50 mg/dL Final  09/05/2008  6:20 AM 1.8*  Final  05/21/2008  8:41 PM 1.90* 0.40-1.50 mg/dL Final  05/18/2007 10:15 PM 1.50  0.40-1.50 mg/dL Final  04/13/2007 10:48 PM 1.34  0.40-1.50 mg/dL Final     PMHx:   Past Medical History  Diagnosis Date  . Diabetes mellitus   . Hypertension   . Hyperlipidemia   . Chronic kidney disease   . PVD (peripheral vascular disease)   . Venous insufficiency   .  Morbid obesity   . Chronic cystitis   . Arthritis   . UMBILICAL HERNIA 4/78/2956    Qualifier: History of  By: [name] MD, Audelia Acton    . NEPHROLITHIASIS, HX OF 12/02/2009    Qualifier: Diagnosis of  By: [name], Cat    . Lymphedema     Past Surgical History  Procedure Laterality Date  . R knee arthoscopic repair of meniscus    . Umbilical hernia repair    . Amputation      Right and left fifth toes.   . Popliteal to posterior tibial bypass      2006    Family Hx:  Family History  Problem Relation Age of Onset  . Diabetes Mother   . Diabetes Brother     Social History:  reports that he quit smoking about 34 years ago. He does not have any smokeless tobacco history on file. He reports that he does not drink alcohol or use illicit drugs.  Allergies:  Allergies  Allergen Reactions  . Shrimp [Shellfish Allergy] Swelling  . Iodine Swelling    Medications: Prior to Admission medications    Medication Sig Start Date End Date Taking? Authorizing Provider  amLODipine (NORVASC) 10 MG tablet take 1 tablet by mouth once daily 10/08/13  Yes Waldemar Dickens, MD  aspirin 81 MG tablet Take 81 mg by mouth daily.     Yes Historical Provider, MD  carvedilol (COREG) 12.5 MG tablet Take 1 tablet (12.5 mg total) by mouth 2 (two) times daily with a meal. 01/15/14  Yes Waldemar Dickens, MD  furosemide (LASIX) 80 MG tablet Take 80 mg by mouth 2 (two) times daily. Taking twice a week 09/09/13  Yes Waldemar Dickens, MD  glipiZIDE (GLUCOTROL) 10 MG tablet Take 10 mg by mouth daily before breakfast. Hold for sugar <98   Yes Historical Provider, MD  hydrALAZINE (APRESOLINE) 100 MG tablet Take 1 tablet (100 mg total) by mouth 2 (two) times daily. take 1 tablet by mouth twice a day 01/20/14  Yes Waldemar Dickens, MD  simvastatin (ZOCOR) 80 MG tablet Take 1 tablet (80 mg total) by mouth daily. 10/02/12  Yes Waldemar Dickens, MD    I have reviewed the patient's current medications.  Labs:  Results for orders placed during the hospital encounter of 03/07/14 (from the past 48 hour(s))  CBC     Status: Abnormal   Collection Time    03/07/14 11:10 AM      Result Value Ref Range   WBC 12.0 (*) 4.0 - 10.5 K/uL   RBC 3.06 (*) 4.22 - 5.81 MIL/uL   Hemoglobin 8.5 (*) 13.0 - 17.0 g/dL   HCT 25.8 (*) 39.0 - 52.0 %   MCV 84.3  78.0 - 100.0 fL   MCH 27.8  26.0 - 34.0 pg   MCHC 32.9  30.0 - 36.0 g/dL   RDW 14.6  11.5 - 15.5 %   Platelets 221  150 - 400 K/uL  PRO B NATRIURETIC PEPTIDE     Status: Abnormal   Collection Time    03/07/14 11:10 AM      Result Value Ref Range   Pro B Natriuretic peptide (BNP) 1074.0 (*) 0 - 125 pg/mL  BASIC METABOLIC PANEL     Status: Abnormal   Collection Time    03/07/14 11:10 AM      Result Value Ref Range   Sodium 139  137 - 147 mEq/L   Potassium 4.5  3.7 -  5.3 mEq/L   Chloride 103  96 - 112 mEq/L   CO2 20  19 - 32 mEq/L   Glucose, Bld 168 (*) 70 - 99 mg/dL   BUN 72 (*) 6 - 23  mg/dL   Creatinine, Ser 6.20 (*) 0.50 - 1.35 mg/dL   Calcium 7.7 (*) 8.4 - 10.5 mg/dL   GFR calc non Af Amer 9 (*) >90 mL/min   GFR calc Af Amer 10 (*) >90 mL/min   Comment: (NOTE)     The eGFR has been calculated using the CKD EPI equation.     This calculation has not been validated in all clinical situations.     eGFR's persistently <90 mL/min signify possible Chronic Kidney     Disease.  Randolm Idol, ED     Status: None   Collection Time    03/07/14 11:18 AM      Result Value Ref Range   Troponin i, poc 0.02  0.00 - 0.08 ng/mL   Comment 3            Comment: Due to the release kinetics of cTnI,     a negative result within the first hours     of the onset of symptoms does not rule out     myocardial infarction with certainty.     If myocardial infarction is still suspected,     repeat the test at appropriate intervals.  URINALYSIS, ROUTINE W REFLEX MICROSCOPIC     Status: Abnormal   Collection Time    03/07/14 11:53 AM      Result Value Ref Range   Color, Urine YELLOW  YELLOW   APPearance CLEAR  CLEAR   Specific Gravity, Urine 1.013  1.005 - 1.030   pH 6.0  5.0 - 8.0   Glucose, UA 100 (*) NEGATIVE mg/dL   Hgb urine dipstick TRACE (*) NEGATIVE   Bilirubin Urine NEGATIVE  NEGATIVE   Ketones, ur NEGATIVE  NEGATIVE mg/dL   Protein, ur 100 (*) NEGATIVE mg/dL   Urobilinogen, UA 0.2  0.0 - 1.0 mg/dL   Nitrite NEGATIVE  NEGATIVE   Leukocytes, UA NEGATIVE  NEGATIVE  URINE MICROSCOPIC-ADD ON     Status: Abnormal   Collection Time    03/07/14 11:53 AM      Result Value Ref Range   Squamous Epithelial / LPF RARE  RARE   WBC, UA 7-10  <3 WBC/hpf   RBC / HPF 0-2  <3 RBC/hpf   Bacteria, UA MANY (*) RARE  GLUCOSE, CAPILLARY     Status: None   Collection Time    03/07/14  4:20 PM      Result Value Ref Range   Glucose-Capillary 73  70 - 99 mg/dL     ROS:  A comprehensive review of systems was negative except for: Cardiovascular: positive for dyspnea, exertional chest  pressure/discomfort and lower extremity edema  Physical Exam: Filed Vitals:   03/07/14 1615  BP: 185/79  Pulse: 67  Temp: 98.1 F (36.7 C)  Resp: 24     General: obese , pleasant , BM- just finished off a tray of food HEENT: PERRLA, EOMI, mucous membranes moist Neck: positive JVD Heart: RRR Lungs: upper fields clear, lower less so, positive rales Abdomen: obese, soft, possible abdominal wall edema Extremities: wrapped- pitting edema to at least upper thighs Skin: warm and dry Neuro: alert, non focal.   Assessment/Plan: 63 year old BM with HTN, DM, obesity and progressive CKD with a fairly bland U/A 1.Renal-  Unfortunately what I think we are seeing here is just progression of CKD over the years of poorly controlled DM and HTN Also , the NSAIDS that he took not that long ago are not helping.   Given his bland U/A there is no other work up that needs to be done except making sure he is not obstructed with a renal ultrasound.  I will check an SPEP but I think that will be low yield. I suspect his volume overload has a lot to do with his CKD and the cramping is likely a symptom as well.  Fortunately he does not appear to be having other overwhelming uremic symptoms.  I have told him that we are at a severe point with his kidney function.  We will need to see how well he responds to aggressive diuresis.  Worse case scenario- he needs to start dialysis this admit- best case- he leaves with dialysis education and a fistula in place to follow up as OP.  2. Hypertension/volume  - HTN likely due to volume- will start with lasix 160 every 8 hours, place foley for accurate UOP and follow.  He is on other antihypertensives as well- norvasc 10/hydralazine and coreg 3. Anemia  - likely related to CKD- agree with checking iron stores and I will add aranesp 4. Bones- PTH and phos pending, will treat as needed    Thank you for this consult, I will follow with you  Shanetra Blumenstock A 03/07/2014, 4:43 PM

## 2014-03-07 NOTE — ED Notes (Signed)
Marissa, PA at bedside

## 2014-03-07 NOTE — ED Provider Notes (Signed)
CSN: [id number]     Arrival date & time 03/07/14  1040 History   First MD Initiated Contact with Patient 03/07/14 1112     Chief Complaint  Patient presents with  . Shortness of Breath     (Consider location/radiation/quality/duration/timing/severity/associated sxs/prior Treatment) The history is provided by the patient. No language interpreter was used.  [name] is a 63 year old male with past medical history of diabetes, hypertension, hyperlipidemia, chronic kidney disease, PVD, lymphedema, diastolic CHF presenting to the ED with increased shortness of breath and difficulty breathing for the past 2 and half weeks. Patient reported that the difficulty breathing and shortness of breath occurs with exertion and even at rest. Stated he's noticed increasing swelling to his legs bilaterally. Reported that he normally takes Lasix-reported that he has not taken Lasix lately. Reported that he was seen and assessed this morning by [facility] who recommended patient to come to the ED and be admitted under family practice as inpatient regarding acute on chronic exacerbation of CHF. Reported that he's been having intermittent chest tightness, pressure sensation. Reported intermittent nasal congestion on and off dry cough. Reported that he used to smoke for 30 years, but has now quit for 35 years. Denied oxygen therapy at home. Denied changes in urine, bowel movement, chest pain, abdominal pain, nausea, vomiting, melena, hematochezia, fever, chills. PCP Dr. Barbaraann Faster   Past Medical History  Diagnosis Date  . Diabetes mellitus   . Hypertension   . Hyperlipidemia   . Chronic kidney disease   . PVD (peripheral vascular disease)   . Venous insufficiency   . Morbid obesity   . Chronic cystitis   . Arthritis   . UMBILICAL HERNIA [id number]    Qualifier: History of  By: [name] MD, Audelia Acton    . NEPHROLITHIASIS, HX OF 12/02/2009    Qualifier: Diagnosis of  By: [name], Cat    . Lymphedema    Past  Surgical History  Procedure Laterality Date  . R knee arthoscopic repair of meniscus    . Umbilical hernia repair    . Amputation      Right and left fifth toes.   . Popliteal to posterior tibial bypass      2006   Family History  Problem Relation Age of Onset  . Diabetes Mother   . Diabetes Brother    History  Substance Use Topics  . Smoking status: Former Smoker    Quit date: 06/06/1979  . Smokeless tobacco: Not on file  . Alcohol Use: No     Comment: occasional    Review of Systems  Constitutional: Positive for unexpected weight change (increase). Negative for fever and chills.  HENT: Positive for congestion.   Respiratory: Positive for cough and shortness of breath. Negative for chest tightness.   Cardiovascular: Positive for leg swelling.  Gastrointestinal: Negative for nausea, vomiting and abdominal pain.  Neurological: Negative for dizziness and weakness.  All other systems reviewed and are negative.      Allergies  Shrimp and Iodine  Home Medications   No current outpatient prescriptions on file. BP 182/76  Pulse 67  Temp(Src) 98.1 F (36.7 C) (Oral)  Resp 24  Ht 5\' 11"  (1.803 m)  Wt 291 lb 3.6 oz (132.1 kg)  BMI 40.64 kg/m2  SpO2 100% Physical Exam  Nursing note and vitals reviewed. Constitutional: He is oriented to person, place, and time. He appears well-developed and well-nourished. No distress.  HENT:  Head: Normocephalic and atraumatic.  Mouth/Throat: Oropharynx is  clear and moist. No oropharyngeal exudate.  Eyes: Conjunctivae and EOM are normal. Pupils are equal, round, and reactive to light. Right eye exhibits no discharge. Left eye exhibits no discharge.  Neck: Normal range of motion. Neck supple. No tracheal deviation present.  Cardiovascular: Normal rate, regular rhythm and normal heart sounds.  Exam reveals no friction rub.   No murmur heard. Pulses:      Radial pulses are 2+ on the right side, and 2+ on the left side.  Swelling to  the lower extremities bilaterally with 2+ pitting edema noted  Pulmonary/Chest: Effort normal. No respiratory distress. He has no wheezes. He has no rales.  Decreased breath sounds to upper and lower lobes bilaterally Minimal diminished lung expansion Patient is able to speak in full sentences without difficulty, mild short of breath at the end of each sentence  Musculoskeletal: Normal range of motion.  Full ROM to upper and lower extremities without difficulty noted, negative ataxia noted.  Lymphadenopathy:    He has no cervical adenopathy.  Neurological: He is alert and oriented to person, place, and time. No cranial nerve deficit. He exhibits normal muscle tone. Coordination normal.  Cranial nerves III-XII grossly intact Strength 5+/5+ to upper and lower extremities bilaterally with resistance applied, equal distribution noted  Skin: Skin is warm and dry. No rash noted. He is not diaphoretic. No erythema.  Psychiatric: He has a normal mood and affect. His behavior is normal. Thought content normal.    ED Course  Procedures (including critical care time)  1:09 PM This provider spoke with Dr. Lamar Benes, [facility] practice. Discussed case, history, labs and imaging in great detail. Patient to be admitted to the hospital [place] floor as inpatient under the care of Dr. Phineas Inches.   Results for orders placed during the hospital encounter of 03/07/14  CBC      Result Value Ref Range   WBC 12.0 (*) 4.0 - 10.5 K/uL   RBC 3.06 (*) 4.22 - 5.81 MIL/uL   Hemoglobin 8.5 (*) 13.0 - 17.0 g/dL   HCT 25.8 (*) 39.0 - 52.0 %   MCV 84.3  78.0 - 100.0 fL   MCH 27.8  26.0 - 34.0 pg   MCHC 32.9  30.0 - 36.0 g/dL   RDW 14.6  11.5 - 15.5 %   Platelets 221  150 - 400 K/uL  PRO B NATRIURETIC PEPTIDE      Result Value Ref Range   Pro B Natriuretic peptide (BNP) 1074.0 (*) 0 - 125 pg/mL  BASIC METABOLIC PANEL      Result Value Ref Range   Sodium 139  137 - 147 mEq/L   Potassium 4.5  3.7 - 5.3 mEq/L    Chloride 103  96 - 112 mEq/L   CO2 20  19 - 32 mEq/L   Glucose, Bld 168 (*) 70 - 99 mg/dL   BUN 72 (*) 6 - 23 mg/dL   Creatinine, Ser 6.20 (*) 0.50 - 1.35 mg/dL   Calcium 7.7 (*) 8.4 - 10.5 mg/dL   GFR calc non Af Amer 9 (*) >90 mL/min   GFR calc Af Amer 10 (*) >90 mL/min  URINALYSIS, ROUTINE W REFLEX MICROSCOPIC      Result Value Ref Range   Color, Urine YELLOW  YELLOW   APPearance CLEAR  CLEAR   Specific Gravity, Urine 1.013  1.005 - 1.030   pH 6.0  5.0 - 8.0   Glucose, UA 100 (*) NEGATIVE mg/dL   Hgb urine dipstick TRACE (*)  NEGATIVE   Bilirubin Urine NEGATIVE  NEGATIVE   Ketones, ur NEGATIVE  NEGATIVE mg/dL   Protein, ur 100 (*) NEGATIVE mg/dL   Urobilinogen, UA 0.2  0.0 - 1.0 mg/dL   Nitrite NEGATIVE  NEGATIVE   Leukocytes, UA NEGATIVE  NEGATIVE  URINE MICROSCOPIC-ADD ON      Result Value Ref Range   Squamous Epithelial / LPF RARE  RARE   WBC, UA 7-10  <3 WBC/hpf   RBC / HPF 0-2  <3 RBC/hpf   Bacteria, UA MANY (*) RARE  GLUCOSE, CAPILLARY      Result Value Ref Range   Glucose-Capillary 73  70 - 99 mg/dL  I-STAT TROPOININ, ED      Result Value Ref Range   Troponin i, poc 0.02  0.00 - 0.08 ng/mL   Comment 3             Labs Review Labs Reviewed  CBC - Abnormal; Notable for the following:    WBC 12.0 (*)    RBC 3.06 (*)    Hemoglobin 8.5 (*)    HCT 25.8 (*)    All other components within normal limits  PRO B NATRIURETIC PEPTIDE - Abnormal; Notable for the following:    Pro B Natriuretic peptide (BNP) 1074.0 (*)    All other components within normal limits  BASIC METABOLIC PANEL - Abnormal; Notable for the following:    Glucose, Bld 168 (*)    BUN 72 (*)    Creatinine, Ser 6.20 (*)    Calcium 7.7 (*)    GFR calc non Af Amer 9 (*)    GFR calc Af Amer 10 (*)    All other components within normal limits  URINALYSIS, ROUTINE W REFLEX MICROSCOPIC - Abnormal; Notable for the following:    Glucose, UA 100 (*)    Hgb urine dipstick TRACE (*)    Protein, ur 100 (*)     All other components within normal limits  URINE MICROSCOPIC-ADD ON - Abnormal; Notable for the following:    Bacteria, UA MANY (*)    All other components within normal limits  URINE CULTURE  GLUCOSE, CAPILLARY  CBC  CREATININE, SERUM  TSH  IRON AND TIBC  FERRITIN  PTH, INTACT AND CALCIUM  TROPONIN I  CBC  RENAL FUNCTION PANEL  PROTEIN ELECTROPHORESIS, SERUM  I-STAT TROPOININ, ED   Imaging Review Dg Chest 2 View  03/07/2014   CLINICAL DATA:  Short of breath  EXAM: CHEST  2 VIEW  COMPARISON:  08/19/2013  FINDINGS: Heart size and vascularity are normal.  Mild left lower lobe airspace disease and small left effusion. This may represent atelectasis or possibly pneumonia.  IMPRESSION: Mild left lower lobe atelectasis/ infiltrate and small left effusion.   Electronically Signed   By: Franchot Gallo M.D.   On: 03/07/2014 11:46     EKG Interpretation None      Date: 03/07/2014  Rate: 67  Rhythm: normal sinus rhythm  QRS Axis: normal  Intervals: normal  ST/T Wave abnormalities: normal  Conduction Disutrbances:none  Narrative Interpretation:   Old EKG Reviewed: unchanged EKG analyzed reviewed by this provider and attending physician.  MDM   Final diagnoses:  Acute exacerbation of CHF (congestive heart failure)  Chronic kidney disease   Medications  hydrALAZINE (APRESOLINE) injection 10 mg (10 mg Intravenous Given 03/07/14 1740)  hydrALAZINE (APRESOLINE) tablet 100 mg (not administered)  carvedilol (COREG) tablet 12.5 mg (12.5 mg Oral Given 03/07/14 1730)  amLODipine (NORVASC) tablet 10 mg (  not administered)  aspirin tablet 81 mg (not administered)  heparin injection 5,000 Units (5,000 Units Subcutaneous Given 03/07/14 1740)  sodium chloride 0.9 % injection 3 mL (not administered)  sodium chloride 0.9 % injection 3 mL (not administered)  sodium chloride 0.9 % injection 3 mL (not administered)  0.9 %  sodium chloride infusion (250 mLs Intravenous New Bag/Given 03/07/14 1843)   insulin aspart (novoLOG) injection 0-9 Units (0 Units Subcutaneous Not Given 03/07/14 1630)  calcium acetate (PHOSLO) capsule 667 mg (667 mg Oral Given 03/07/14 1729)  furosemide (LASIX) 100 mg in dextrose 5 % 50 mL IVPB (100 mg Intravenous Given 03/07/14 1839)  simvastatin (ZOCOR) tablet 80 mg (80 mg Oral Given 03/07/14 1730)  furosemide (LASIX) 160 mg in dextrose 5 % 50 mL IVPB (not administered)  darbepoetin (ARANESP) injection 100 mcg (not administered)  furosemide (LASIX) injection 20 mg (20 mg Intravenous Given 03/07/14 1319)  levofloxacin (LEVAQUIN) IVPB 750 mg (0 mg Intravenous Stopped 03/07/14 1449)   Filed Vitals:   03/07/14 1503 03/07/14 1515 03/07/14 1615 03/07/14 1740  BP: 184/72 179/76 185/79 182/76  Pulse: 62 62 67   Temp:   98.1 F (36.7 C)   TempSrc:   Oral   Resp: 20  24   Height:   5\' 11"  (1.803 m)   Weight:   291 lb 3.6 oz (132.1 kg)   SpO2: 100% 99% 100%     Patient presenting to the ED with increase in shortness of breath and difficulty breathing for the past 2 and a half weeks. Patient reported that he has been having intermittent cough and nasal congestion - reported that this is more chronic. Reported that he has noticed swelling to his lower extremities bilaterally - reported that he does have history of lymphedema bilaterally. Reported that he has noticed increased in leg swelling more so than normal. Patient reported that he was seen and assessed by [facility] cardiology who reported that his increase in CHF exacerbation could be due to his CKD and kidney functioning worsening. Patient reported that he was recommended to come to the ED to be admitted. Patient reported baseline weight to be 292 lbs.  This provider reviewed the patient's chart - patient was seen and assessed at [facility] earlier this afternoon where he was seen by Richardson Dopp, PA-C who recommended patient to come to the ED to be admitted under the [facility] Service for acute on chronic CHF  exacerbation from kidney failure.  Alert and oriented. GCS 15. Heart rate and rhythm normal. Lungs noted to be clear with decreased breath sounds and diminished lung expansion. Patient is able to speak in full sentences without difficulty, but at the end of each sentence he does have to take a deep breath. Radial pulses 2+ bilaterally. Pitting edema noted to the lower extremities bilaterally - wrap noted around the patient's legs bilaterally due to lymphedema - pitting edema to just below the knees bilaterally, with 2+ pitting edema. Negative fluid wave noted to abdomen.  EKG noted normal sinus rhythm with a heart rate of 67 bpm- negative ischemic findings noted. Troponin negative elevated.  CBC noted mild elevated white cell count of 12.0. CMP noted elevated BUN and creatinine-BUN 72, creatinine 6.20 - creatinine has actually increased when compared to level on 03/05/2014 when level was 5.95. BMP has improved when compared to last measurement back in March 2015 decreased from 3 days ago from 1604.0 to 1074.0. Urinalysis noted trace hemoglobin-negative nitrites or leukocytes, approximately 7-10 white  blood cell count noted, mild pyuria. Chest x-ray noted mild left lower lobe atelectasis versus infiltrate and small left effusion. Patient had an increase in weight by 4 pounds-292 pounds to 296 pounds weight today.  Worsening of creatinine increased from 5.95 to 6.20 - patient presenting with CKD. Suspicion of overload secondary to congestive heart failure-acute exacerbation of CHF. Worsening of chronic kidney disease with elevated creatinine. Chest x-ray noted possible infiltrate-will start patient on antibiotics here in ED setting. Pulse ox within normal limits on room air-97% on room air-nausea needed at this time. Patient given Lasix while in ED setting for fluid overload. Patient to be admitted to the hospital under the care of Dr. Phineas Inches, family medicine practice-Telemetry as inpatient due to acute on chronic  CHF exacerbation, elevated creatinine with worsening of chronic kidney disease. Discussed plan of admission the patient. Patient understood and agreed to plan. Patient stable for transfer.  Jamse Mead, PA-C 03/07/14 1901

## 2014-03-07 NOTE — ED Notes (Signed)
Family medicine paged, requesting treatment for patients SBP.  Orders to be placed by physician

## 2014-03-07 NOTE — ED Notes (Signed)
Patient to xray.

## 2014-03-07 NOTE — Progress Notes (Signed)
[place], [place], [place]  [address] Phone: [phone] Fax:  [phone]  Date:  03/07/2014   ID:  [name], DOB 1951/09/18, MRN [id number]  PCP:  Linna Darner, MD  Cardiologist:  Dr. Minus Breeding     History of Present Illness: [name] is a 63 y.o. male with a hx of CKD, DM, HTN, HL, diastolic CHF.  Evaluated by Dr. Minus Breeding in 10/2013 for dyspnea.  Echo demonstrated normal LVF and mod diastolic dysfunction.  Myoview was negative for ischemia.  He was recently seen by primary care for worsening dyspnea and volume overload.  Creatinine has significantly worsened (3.97 in 10/2013 => 5.95 in 02/2014).     The patient notes increased DOE over the past 2-3 weeks.  He is NYHA Class 3.  He has slept in a recliner for years.  Denies PND.  He notes chest pressure with exertion.  He denies assoc symptoms.  Denies syncope.  Studies:  - Echo (11/19/13):  EF 60-65%, no RWMA, Gr 2 DD.   - Nuclear (01/06/14):  LexiScan; diaphragmatic attenuation, no ischemia, EF 51%. Low Risk  - Carotid US (11/08/13):  Bilateral 1-39% - f/u 1 yr.   Recent Labs: 10/14/2013: ALT 11  03/04/2014: Creatinine 5.95*; Hemoglobin 8.5*; Potassium 4.8; Pro B Natriuretic peptide (BNP) 1604.00*   Wt Readings from Last 3 Encounters:  03/07/14 303 lb (137.44 kg)  03/04/14 302 lb (136.986 kg)  02/28/14 295 lb (133.811 kg)     Past Medical History  Diagnosis Date  . Diabetes mellitus   . Hypertension   . Hyperlipidemia   . Chronic kidney disease   . PVD (peripheral vascular disease)   . Venous insufficiency   . Morbid obesity   . Chronic cystitis   . Arthritis   . UMBILICAL HERNIA 1/63/8453    Qualifier: History of  By: [name] MD, Audelia Acton    . NEPHROLITHIASIS, HX OF 12/02/2009    Qualifier: Diagnosis of  By: [name], Cat    . Lymphedema     Current Outpatient Prescriptions  Medication Sig Dispense Refill  . amLODipine (NORVASC) 10 MG tablet take 1 tablet by mouth once daily   90 tablet  3  . aspirin 81 MG tablet Take 81 mg by mouth daily.        . carvedilol (COREG) 12.5 MG tablet Take 1 tablet (12.5 mg total) by mouth 2 (two) times daily with a meal.  180 tablet  3  . furosemide (LASIX) 80 MG tablet Take 80 mg by mouth. Taking twice a week      . glipiZIDE (GLUCOTROL) 10 MG tablet Take 1 tablet (10 mg total) by mouth daily before breakfast.  90 tablet  3  . hydrALAZINE (APRESOLINE) 100 MG tablet Take 1 tablet (100 mg total) by mouth 2 (two) times daily. take 1 tablet by mouth twice a day  180 tablet  3  . silver sulfADIAZINE (SILVADENE) 1 % cream Apply 1 application topically daily.  50 g  0  . simvastatin (ZOCOR) 80 MG tablet Take 1 tablet (80 mg total) by mouth daily.  90 tablet  1  . Vitamins-Lipotropics (LIPO-FLAVONOID PLUS PO) Take 1 tablet by mouth 3 (three) times daily.       No current facility-administered medications for this visit.    Allergies:   Shrimp and Iodine   Social History:  The patient  reports that he quit smoking about 34 years ago. He does not have  any smokeless tobacco history on file. He reports that he does not drink alcohol or use illicit drugs.   Family History:  The patient's family history includes Diabetes in his brother and mother.   ROS:  Please see the history of present illness.   He has a non-productive cough.   All other systems reviewed and negative.   PHYSICAL EXAM: VS:  BP 170/88  Pulse 66  Ht '5\' 11"'  (1.803 m)  Wt 303 lb (137.44 kg)  BMI 42.28 kg/m2 Well nourished, well developed, in no acute distress HEENT: normal Neck:  + JVD Cardiac:  normal S1, S2; RRR; 5-1/7 systolic murmur @ LSB Lungs:  clear to auscultation bilaterally, no wheezing, rhonchi or rales Abd:  distended, nontender, no hepatomegaly Ext: massive bilateral LE edema; UNNA boots in place bilaterally Skin: warm and dry Neuro:  CNs 2-12 intact, no focal abnormalities noted  EKG:  NSR, HR 66, noraml axis, NSSTTW changes     ASSESSMENT AND  PLAN:  1. Acute on Chronic Diastolic CHF:  This is exacerbated by worsening renal failure.  I reviewed with Dr. Darlin Coco (DOD) who agreed we need to get renal involved.  Patient has seen Dr. Florene Glen in the past.  I reviewed the case with Dr. Marval Regal today.  In reviewing the chart, the patient has had MGUS in the past.  He is concerned he may have converted to multiple myeloma.  Given his worsening creatinine and volume excess, he has recommended admission to the hospital.  The patient will need to be seen by nephrology, cardiology and oncology.  Our team will follow him in consultation.  I have spoken to the patient's PCP.  We will send him to the ED to be admitted by the [facility] inpatient team. 2. Chronic Kidney Disease Stage 5:  As noted, he will need nephrology to follow him in the hospital.   3. MGUS:  As noted, he will need to see oncology as well to work up for possible progression to multiple myeloma.  4. Hypertension:  Uncontrolled.  He tells me he has not taken any medications yet today.  5. Hyperlipidemia:  Continue statin.  Simvastatin dose will need to be lowered if Amlodipine is continued.  6. Lymphedema:  This is chronic.  It has worsened over the past 2 weeks.  He will need continued management with wound care.  7. Disposition:  The patient will go directly to the ED to be admitted today.  Signed, Richardson Dopp, PA-C  03/07/2014 9:06 AM

## 2014-03-08 ENCOUNTER — Observation Stay (HOSPITAL_COMMUNITY): Payer: Medicare Other

## 2014-03-08 DIAGNOSIS — I5030 Unspecified diastolic (congestive) heart failure: Secondary | ICD-10-CM

## 2014-03-08 DIAGNOSIS — N179 Acute kidney failure, unspecified: Secondary | ICD-10-CM | POA: Diagnosis present

## 2014-03-08 LAB — CBC
HCT: 23.4 % — ABNORMAL LOW (ref 39.0–52.0)
HEMOGLOBIN: 7.7 g/dL — AB (ref 13.0–17.0)
MCH: 27.6 pg (ref 26.0–34.0)
MCHC: 32.9 g/dL (ref 30.0–36.0)
MCV: 83.9 fL (ref 78.0–100.0)
Platelets: 222 10*3/uL (ref 150–400)
RBC: 2.79 MIL/uL — ABNORMAL LOW (ref 4.22–5.81)
RDW: 14.4 % (ref 11.5–15.5)
WBC: 10.3 10*3/uL (ref 4.0–10.5)

## 2014-03-08 LAB — GLUCOSE, CAPILLARY
GLUCOSE-CAPILLARY: 191 mg/dL — AB (ref 70–99)
Glucose-Capillary: 108 mg/dL — ABNORMAL HIGH (ref 70–99)
Glucose-Capillary: 142 mg/dL — ABNORMAL HIGH (ref 70–99)
Glucose-Capillary: 201 mg/dL — ABNORMAL HIGH (ref 70–99)

## 2014-03-08 LAB — RENAL FUNCTION PANEL
Albumin: 2.1 g/dL — ABNORMAL LOW (ref 3.5–5.2)
BUN: 69 mg/dL — ABNORMAL HIGH (ref 6–23)
CALCIUM: 7.9 mg/dL — AB (ref 8.4–10.5)
CO2: 21 meq/L (ref 19–32)
CREATININE: 6.45 mg/dL — AB (ref 0.50–1.35)
Chloride: 105 mEq/L (ref 96–112)
GFR calc non Af Amer: 8 mL/min — ABNORMAL LOW (ref >90)
GFR, EST AFRICAN AMERICAN: 9 mL/min — AB (ref >90)
Glucose, Bld: 113 mg/dL — ABNORMAL HIGH (ref 70–99)
Phosphorus: 6.4 mg/dL — ABNORMAL HIGH (ref 2.3–4.6)
Potassium: 4.9 mEq/L (ref 3.7–5.3)
Sodium: 140 mEq/L (ref 137–147)

## 2014-03-08 LAB — IRON AND TIBC
Iron: 61 ug/dL (ref 42–135)
Saturation Ratios: 24 % (ref 20–55)
TIBC: 249 ug/dL (ref 215–435)
UIBC: 188 ug/dL (ref 125–400)

## 2014-03-08 LAB — TSH: TSH: 2.102 u[IU]/mL (ref 0.350–4.500)

## 2014-03-08 LAB — FERRITIN: Ferritin: 204 ng/mL (ref 22–322)

## 2014-03-08 MED ORDER — CALCIUM ACETATE 667 MG PO CAPS
1334.0000 mg | ORAL_CAPSULE | Freq: Three times a day (TID) | ORAL | Status: DC
  Administered 2014-03-08 – 2014-03-11 (×11): 1334 mg via ORAL
  Filled 2014-03-08 (×12): qty 2

## 2014-03-08 MED ORDER — ATORVASTATIN CALCIUM 40 MG PO TABS
40.0000 mg | ORAL_TABLET | Freq: Every day | ORAL | Status: DC
  Administered 2014-03-08 – 2014-03-11 (×4): 40 mg via ORAL
  Filled 2014-03-08 (×4): qty 1

## 2014-03-08 MED ORDER — ACETAMINOPHEN 325 MG PO TABS
650.0000 mg | ORAL_TABLET | Freq: Four times a day (QID) | ORAL | Status: DC | PRN
  Administered 2014-03-08: 650 mg via ORAL
  Filled 2014-03-08: qty 2

## 2014-03-08 MED ORDER — SODIUM CHLORIDE 0.9 % IV SOLN
125.0000 mg | Freq: Every day | INTRAVENOUS | Status: AC
  Administered 2014-03-08 – 2014-03-10 (×3): 125 mg via INTRAVENOUS
  Filled 2014-03-08 (×5): qty 10

## 2014-03-08 NOTE — Progress Notes (Signed)
Family Medicine Teaching Service Daily Progress Note Intern Pager: [phone]  Patient name: [name] Medical record number: [id number] Date of birth: [birth year] Age: 63 y.o. Gender: male  Primary Care Provider: Linna Darner, MD Consultants: Cardiology, nephrology Code Status: FULL  Pt Overview and Major Events to Date:  3/27: Admitted with worsened kidney function and fluid overload; renal consulted  Assessment and Plan: [name] is a 63 y.o. male presenting with shortness of breath, suspected CHF exacerbation. PMH is significant for Diastolic CHF, HTN, PVD, 123456, CKD, MGUS, obesity.   # Dyspnea: Likely due to worsening CKD in setting of acute on chronic diastolic CHF: Echo [id number] EF 60-65% with grade II diastolic dysfcn, Myoview [id number]. No O2 requirement. BNP down 1604 to 1074 over past 3 days. Electrolytes normal, BUN 72 Cr 6.2 (see below). i-stat 0.02. EKG NSR and trop neg x 1. CXR with no pulmonary edema but does have mild LLL atelectasis vs infiltrate and small left effusion. Overall appears comfortable currently. Received levaquin in ED but no obvious signs of pna (nl WBC, no fever, no cough) so will not continue.  - continuous cardiac/pulse ox monitoring  - lasix 160mg  IV TID per renal recs.  - F/u TSH - Iron panel with iron sat 24 - PT/OT eval/treat  - Daily wt/strict ins and outs - net out over 24 hours 2.6L  # Hypertensive urgency: BP in ED 180-190s/70s. Pt did not take BP meds that day. Also likely related to fluid overload.  - 10mg  hydralazine IV PRN q6hrs for SBP >180, DBP >110  - continue home meds = coreg 12.5mg  bid, amlodipine 10mg   - Lasix 160mg  TID.  - monitor BP.  # CKD: worsening creatinine throughout 2014. 3d ago 5.95 up to 6.20 on admission. Previously seen nephrologist but not in "a while." Previous physician Dr Florene Glen. Never on HD.  - F/u renal recs: lasix 160mg  IV TID. Checking renal US, SPEP. Will consider fistula and possible HD this  hospitalization.  - TSH, PTH, and start aranesp. Checking Ca, Mg, Phos and will start phoslo (corrected Ca 8.5, previous phos high).  - continue to trend creatinine with renal function panel and f/u UOP.  - Order placed to "save left arm" and will order vein mapping today  # History of ?MGUS: Pt unaware of dx. Never followed up with hematologist. Workup appears to have been done in 2009-11: SPEP with monoclonal IgG kappa protein. WBC appears to be chronically mildly elevated, baseline 12, currently 12.0.  - may consider consult to heme/onc vs outpatient f/u.   # T2DM: A1c 6.8 in 02/26/14. On glipizide at home. Reports hypoglycemia this morning but bmet with glucose 168  - hold glipizide  - SSI   # HLD:  - pt prefers home zocor to lipitor.   # Chronic venous insufficiency: Unna boot put on Wednesday with plan for change weekly  - wound care for unna boot q week   # Urinary findings: UA with many bact and 7-10 wbc; no reported symptoms or fever. Stable mild leukocytosis.  - Culture  - Will not treat unless becomes symptomatic.   FEN/GI: saline lock, renal and carb mod diet  Prophylaxis: heparin   Disposition: admit to med-surg on tele   Subjective:  Feels better today with decrease in dyspnea and UNNA boot feeling looser.  Objective: Temp:  [97 F (36.1 C)-98.1 F (36.7 C)] 97 F (36.1 C) (03/28 [id number]) Pulse Rate:  [51-71] 51 (03/28 0611) Resp:  [17-24] 18 (03/28  [id number]) BP: (130-197)/(52-98) 157/52 mmHg (03/28 0611) SpO2:  [95 %-100 %] 100 % (03/28 0611) Weight:  [289 lb 0.4 oz (131.1 kg)-303 lb (137.44 kg)] 289 lb 0.4 oz (131.1 kg) (03/28 [id number]) Physical Exam: General: Seated, brushing teeth, NAD Respiratory: CTAB, normal effort, distant Abdomen: Obese, soft, nontender Extremities: bilateral unna boots in place, visibly a little looser than yesterday Neuro: No focal deficits, normal speech and core tone  Laboratory:  Recent Labs Lab 03/07/14 1110 03/07/14 1909  03/08/14 0506  WBC 12.0* 12.7* 10.3  HGB 8.5* 8.8* 7.7*  HCT 25.8* 26.4* 23.4*  PLT 221 240 222    Recent Labs Lab 03/04/14 1113 03/07/14 1110 03/07/14 1909 03/08/14 0506  NA 141 139  --  140  K 4.8 4.5  --  4.9  CL 110 103  --  105  CO2 19 20  --  21  BUN 67* 72*  --  69*  CREATININE 5.95* 6.20* 5.99* 6.45*  CALCIUM 7.4* 7.7*  --  7.9*  GLUCOSE 123* 168*  --  113*     Imaging/Diagnostic Tests:  03/07/14: DG chest 2 view: IMPRESSION: Mild left lower lobe atelectasis/ infiltrate and small left effusion. Electronically Signed By: Franchot Gallo M.D. On: 03/07/2014 11:46    Hilton Sinclair, MD 03/08/2014, 8:40 AM PGY-2, [place] Intern pager: [phone], text pages welcome

## 2014-03-08 NOTE — Progress Notes (Signed)
Seen and examined.  Discussed with Dr. Darene Lamer.  Appreciate nephrology help.  Will consult vascular surg for future access.

## 2014-03-08 NOTE — Progress Notes (Signed)
Subjective:  Made over 3 liters of urine overnight- BP a little better- BUN.creatinine for the most part is stable- pt refused foley- renal ultrasound still pending  Objective Vital signs in last 24 hours: Filed Vitals:   03/07/14 1615 03/07/14 1740 03/07/14 2147 03/08/14 0611  BP: 185/79 182/76 167/64 157/52  Pulse: 67  65 51  Temp: 98.1 F (36.7 C)  98.1 F (36.7 C) 97 F (36.1 C)  TempSrc: Oral  Oral Oral  Resp: 24  22 18   Height: 5\' 11"  (1.803 m)     Weight: 132.1 kg (291 lb 3.6 oz)   131.1 kg (289 lb 0.4 oz)  SpO2: 100%  100% 100%   Weight change:   Intake/Output Summary (Last 24 hours) at 03/08/14 0851 Last data filed at 03/08/14 [id number]  Gross per 24 hour  Intake    850 ml  Output   4075 ml  Net  -3225 ml    Assessment/Plan: 63 year old BM with HTN, DM, obesity and progressive CKD with a fairly bland U/A  1.Renal- Unfortunately what I think we are seeing here is just progression of CKD over the years of poorly controlled DM and HTN Also , the NSAIDS that he took not that long ago are not helping. Given his bland U/A there is no other work up that needs to be done except making sure he is not obstructed with a renal ultrasound. I will check an SPEP but I think that will be low yield. I suspect his volume overload has a lot to do with his CKD and the cramping is likely a symptom as well. Fortunately he does not appear to be having other overwhelming uremic symptoms. I have told him that we are at a severe point with his kidney function. We will need to see how well he responds to aggressive diuresis. Worse case scenario- he needs to start dialysis this admit- best case- he leaves with dialysis education and a fistula in place to follow up as OP. So far, he has diuresed well with not much impact on kidney function 2. Hypertension/volume - HTN likely due to volume- on lasix 160 every 8 hours,  norvasc 10/hydralazine and coreg - is better, no changes today 3. Anemia - likely related to  CKD-  iron stores low, will replete- also on aranesp  4. Bones- PTH pending, will treat as needed - phos 6.4, calc low, will give phoslo    Artis Buechele A    Labs: Basic Metabolic Panel:  Recent Labs Lab 03/04/14 1113 03/07/14 1110 03/07/14 1909 03/08/14 0506  NA 141 139  --  140  K 4.8 4.5  --  4.9  CL 110 103  --  105  CO2 19 20  --  21  GLUCOSE 123* 168*  --  113*  BUN 67* 72*  --  69*  CREATININE 5.95* 6.20* 5.99* 6.45*  CALCIUM 7.4* 7.7*  --  7.9*  PHOS 6.6*  --   --  6.4*   Liver Function Tests:  Recent Labs Lab 03/04/14 1113 03/08/14 0506  ALBUMIN 3.0* 2.1*   No results found for this basename: LIPASE, AMYLASE,  in the last 168 hours No results found for this basename: AMMONIA,  in the last 168 hours CBC:  Recent Labs Lab 03/04/14 1113 03/07/14 1110 03/07/14 1909 03/08/14 0506  WBC 12.2* 12.0* 12.7* 10.3  HGB 8.5* 8.5* 8.8* 7.7*  HCT 25.6* 25.8* 26.4* 23.4*  MCV 82.3 84.3 83.5 83.9  PLT 253 221  240 222   Cardiac Enzymes:  Recent Labs Lab 03/07/14 1909  TROPONINI <0.30   CBG:  Recent Labs Lab 03/07/14 1620 03/07/14 2129 03/08/14 0631  GLUCAP 73 108* 108*    Iron Studies:  Recent Labs  03/07/14 1909  IRON 61  TIBC 249  FERRITIN 204   Studies/Results: Dg Chest 2 View  03/07/2014   CLINICAL DATA:  Short of breath  EXAM: CHEST  2 VIEW  COMPARISON:  08/19/2013  FINDINGS: Heart size and vascularity are normal.  Mild left lower lobe airspace disease and small left effusion. This may represent atelectasis or possibly pneumonia.  IMPRESSION: Mild left lower lobe atelectasis/ infiltrate and small left effusion.   Electronically Signed   By: Franchot Gallo M.D.   On: 03/07/2014 11:46   Medications: Infusions:    Scheduled Medications: . amLODipine  10 mg Oral Daily  . aspirin  81 mg Oral Daily  . calcium acetate  667 mg Oral TID WC  . carvedilol  12.5 mg Oral BID WC  . darbepoetin (ARANESP) injection - NON-DIALYSIS  100 mcg  Subcutaneous Q Sat-1800  . furosemide  160 mg Intravenous 3 times per day  . heparin  5,000 Units Subcutaneous 3 times per day  . hydrALAZINE  100 mg Oral BID  . insulin aspart  0-9 Units Subcutaneous TID WC  . simvastatin  80 mg Oral q1800  . sodium chloride  3 mL Intravenous Q12H  . sodium chloride  3 mL Intravenous Q12H    have reviewed scheduled and prn medications.  Physical Exam: General: alert, NAD Heart: RRR Lungs: mostly clear Abdomen: distended Extremities: pitting edema    03/08/2014,8:51 AM  LOS: 1 day

## 2014-03-08 NOTE — Progress Notes (Signed)
03/08/14- 1746- J.Yusuf Yu,RN,BSN       Spoke with Dr. Clearance Coots regarding medical advisor determination for inpatient status appropriate. Received orders to change to inpatient status. Orders entered.

## 2014-03-08 NOTE — H&P (Signed)
Seen and examined yesterday.  Discussed with Dr. Darene Lamer.  See my separate note for admission comments.  Agree with Dr. Mcneil Sober management.

## 2014-03-08 NOTE — Progress Notes (Signed)
Utilization review completed.  P.J. Nicholos Aloisi,RN,BSN Case Manager 

## 2014-03-08 NOTE — Evaluation (Signed)
Physical Therapy Evaluation Patient Details Name: [name] MRN: [id number] DOB: 1951/07/12 Today's Date: 03/08/2014   History of Present Illness  Adm with SOB--likely CHF exacerbation. PMH is significant for Diastolic CHF, HTN, PVD, 123456, CKD, & obesity  Clinical Impression  Pt admitted with dyspnea--?CHF exacerbation. Pt mobilizing independently and did well on RA. No PT needs identified. Pt reports he feels the best he's felt in weeks and is walking better now that his legs have gone down and he is not short of breath.    Follow Up Recommendations No PT follow up    Equipment Recommendations  None recommended by PT    Recommendations for Other Services       Precautions / Restrictions Restrictions Weight Bearing Restrictions: No      Mobility  Bed Mobility Overal bed mobility: Modified Independent             General bed mobility comments: OOB and back to bed used rail  Transfers Overall transfer level: Independent Equipment used: None                Ambulation/Gait Ambulation/Gait assistance: Supervision;Independent Ambulation Distance (Feet): 300 Feet Assistive device: None Gait Pattern/deviations: Step-through pattern;Decreased stride length;Wide base of support Gait velocity: decr, however able to significantly incr with cues   General Gait Details: progressed from supervision (due to reporting he felt unsteady his first time up) to independent  Stairs            Wheelchair Mobility    Modified Rankin (Stroke Patients Only)       Balance Overall balance assessment: No apparent balance deficits (not formally assessed)                                   Pertinent Vitals/Pain SaO2 98% on 2L at rest            95% on RA at rest            98% on RA walking            99% on RA at rest    [place] expects to be discharged to:: Private residence Living Arrangements: Alone   Type of Home:  Apartment Home Access: Elevator     Home Layout: One level Home Equipment: Environmental consultant - 2 wheels;Cane - single point;Grab bars - tub/shower;Grab bars - toilet (comfort height toilet)      Prior Function Level of Independence: Independent with assistive device(s)         Comments: uses cane inside if feeling light-headed     Hand Dominance        Extremity/Trunk Assessment   Upper Extremity Assessment: Overall WFL for tasks assessed           Lower Extremity Assessment: Overall WFL for tasks assessed      Cervical / Trunk Assessment: Normal  Communication   Communication: No difficulties  Cognition Arousal/Alertness: Awake/alert Behavior During Therapy: WFL for tasks assessed/performed Overall Cognitive Status: Within Functional Limits for tasks assessed                      General Comments General comments (skin integrity, edema, etc.): pt reports he was just [facility] [employer] to [facility]. encouraged him to start with walking and to discuss with MD when OK to incr activity further    Exercises  Assessment/Plan    PT Assessment Patent does not need any further PT services  PT Diagnosis     PT Problem List    PT Treatment Interventions     PT Goals (Current goals can be found in the Care Plan section) Acute Rehab PT Goals PT Goal Formulation: No goals set, d/c therapy    Frequency     Barriers to discharge        End of Session   Activity Tolerance: Patient tolerated treatment well Patient left: in bed;with call bell/phone within reach;with family/visitor present    Functional Assessment Tool Used: clinical judgement Functional Limitation: Mobility: Walking and moving around Mobility: Walking and Moving Around Current Status [id number]): At least 1 percent but less than 20 percent impaired, limited or restricted Mobility: Walking and Moving Around Goal Status [phone]): At least 1 percent but less than 20 percent impaired,  limited or restricted Mobility: Walking and Moving Around Discharge Status [phone]): At least 1 percent but less than 20 percent impaired, limited or restricted    Time: 1131-1152 PT Time Calculation (min): 21 min   Charges:   PT Evaluation $Initial PT Evaluation Tier I: 1 Procedure PT Treatments $Gait Training: 8-22 mins   PT G Codes:   Functional Assessment Tool Used: clinical judgement Functional Limitation: Mobility: Walking and moving around    [name] 03/08/2014, 12:20 PM Pager [phone]

## 2014-03-08 NOTE — Progress Notes (Signed)
See nursing assessment.

## 2014-03-08 NOTE — Progress Notes (Signed)
OT Cancellation Note  Patient Details Name: [name] MRN: [id number] DOB: [birth year]   Cancelled Treatment:    Reason Eval/Treat Not Completed: OT screened, no needs identified, will sign off Spoke with pt and he verbalized no OT needs.  Benito Mccreedy OTR/L [id number] 03/08/2014, 3:51 PM

## 2014-03-09 LAB — URINE CULTURE

## 2014-03-09 LAB — CBC
HCT: 24.8 % — ABNORMAL LOW (ref 39.0–52.0)
Hemoglobin: 8.1 g/dL — ABNORMAL LOW (ref 13.0–17.0)
MCH: 27.2 pg (ref 26.0–34.0)
MCHC: 32.7 g/dL (ref 30.0–36.0)
MCV: 83.2 fL (ref 78.0–100.0)
PLATELETS: 223 10*3/uL (ref 150–400)
RBC: 2.98 MIL/uL — AB (ref 4.22–5.81)
RDW: 14.4 % (ref 11.5–15.5)
WBC: 9.6 10*3/uL (ref 4.0–10.5)

## 2014-03-09 LAB — GLUCOSE, CAPILLARY
Glucose-Capillary: 140 mg/dL — ABNORMAL HIGH (ref 70–99)
Glucose-Capillary: 185 mg/dL — ABNORMAL HIGH (ref 70–99)
Glucose-Capillary: 155 mg/dL — ABNORMAL HIGH (ref 70–99)
Glucose-Capillary: 155 mg/dL — ABNORMAL HIGH (ref 70–99)

## 2014-03-09 LAB — RENAL FUNCTION PANEL
ALBUMIN: 2.5 g/dL — AB (ref 3.5–5.2)
BUN: 72 mg/dL — ABNORMAL HIGH (ref 6–23)
CHLORIDE: 101 meq/L (ref 96–112)
CO2: 21 mEq/L (ref 19–32)
Calcium: 8.1 mg/dL — ABNORMAL LOW (ref 8.4–10.5)
Creatinine, Ser: 6.57 mg/dL — ABNORMAL HIGH (ref 0.50–1.35)
GFR calc Af Amer: 9 mL/min — ABNORMAL LOW (ref >90)
GFR calc non Af Amer: 8 mL/min — ABNORMAL LOW (ref >90)
Glucose, Bld: 166 mg/dL — ABNORMAL HIGH (ref 70–99)
POTASSIUM: 4.4 meq/L (ref 3.7–5.3)
Phosphorus: 6.6 mg/dL — ABNORMAL HIGH (ref 2.3–4.6)
Sodium: 138 mEq/L (ref 137–147)

## 2014-03-09 MED ORDER — DARBEPOETIN ALFA-POLYSORBATE 100 MCG/0.5ML IJ SOLN
100.0000 ug | INTRAMUSCULAR | Status: DC
  Administered 2014-03-09: 100 ug via SUBCUTANEOUS
  Filled 2014-03-09: qty 0.5

## 2014-03-09 MED ORDER — CARVEDILOL 25 MG PO TABS
25.0000 mg | ORAL_TABLET | Freq: Two times a day (BID) | ORAL | Status: DC
  Administered 2014-03-09 – 2014-03-11 (×5): 25 mg via ORAL
  Filled 2014-03-09 (×6): qty 1

## 2014-03-09 MED ORDER — AMLODIPINE BESYLATE 5 MG PO TABS
5.0000 mg | ORAL_TABLET | Freq: Every day | ORAL | Status: DC
  Administered 2014-03-09 – 2014-03-10 (×2): 5 mg via ORAL
  Filled 2014-03-09 (×3): qty 1

## 2014-03-09 NOTE — ED Provider Notes (Signed)
Medical screening examination/treatment/procedure(s) were performed by non-physician practitioner and as supervising physician I was immediately available for consultation/collaboration.   EKG Interpretation   Date/Time:  Friday March 07 2014 10:46:30 EDT Ventricular Rate:  67 PR Interval:  168 QRS Duration: 92 QT Interval:  408 QTC Calculation: 431 R Axis:   33 Text Interpretation:  Normal sinus rhythm Normal ECG ED PHYSICIAN  INTERPRETATION AVAILABLE IN [facility] Confirmed by TEST, Record  ([id number]) on 03/09/2014 12:51:03 PM       Richarda Blade, MD 03/09/14 2113

## 2014-03-09 NOTE — Progress Notes (Signed)
Alert and oriented.  Arasep injection given sq as ordered.  Iron infusing.  Tolerating without incident.

## 2014-03-09 NOTE — Progress Notes (Signed)
Family Medicine Teaching Service Daily Progress Note Intern Pager: [phone]  Patient name: [name] Medical record number: [id number] Date of birth: 27-May-1951 Age: 63 y.o. Gender: male  Primary Care Provider: Linna Darner, MD Consultants: Cardiology, nephrology Code Status: FULL  Pt Overview and Major Events to Date:  3/27: Admitted with worsened kidney function and fluid overload; renal consulted 3/28: renal US w/ small inf pole cysts bilat  Assessment and Plan: [name] is a 63 y.o. male presenting with shortness of breath, suspected CHF exacerbation. PMH is significant for Diastolic CHF, HTN, PVD, [id number], CKD, MGUS, obesity.   # Dyspnea: Likely due to worsening obesity hypoventilation syndrome, acute on chronic dCHF and CKD/ESRD. Echo 11/19/13 EF 60-65% with grade II diastolic dysfcn, Myoview [id number]. No O2 requirement. BNP down 1604 to 1074 over past 3 days. Electrolytes normal, BUN 72 Cr 6.2 (see below). i-stat 0.02. EKG NSR and trop neg x 1. Received levaquin by ED for possible PNA but this was not continued once pt admitted. 6L net output since admission - continuous cardiac/pulse ox monitoring  - lasix 160mg  IV TID per renal recs.  - PT/OT eval/treat  - continue daily wt/strict ins and outs.   # Hypertensive urgency: BP in ED 180-190s/70s. Pt did not take BP meds that day. Also likely related to fluid overload. Much improved since admission but remains intermittently above goal.  - continue home amlodipine 10mg   - Increase coreg to 25mg  bid (pt w/ only mild dCHF exacerbation so OK to increase dose of bblocker) - Lasix 160mg  TID.   # CKD: worsening creatinine throughout 2014. Pt moving towards ESRD and dialysis.  Appreciate renal recs. Renal US on 3/28 showing small renal cysts in inferior pole bilat.  - awaiting Vein mapping. (order in since 3/28) - F/u renal recs: lasix 160mg  IV TID. Checking renal US, SPEP.  - start aranesp.  - continue to trend creatinine with  renal function panel and f/u UOP.  - Order placed to "save left arm" - f/u urine cx  # T2DM: A1c 6.8 in 02/26/14. On glipizide at home. Reports hypoglycemia this morning but bmet with glucose 168  - hold glipizide  - SSI   # HLD:  - pt prefers home zocor to lipitor.   # Chronic venous insufficiency: Unna boot put on Wednesday with plan for change weekly  - wound care for unna boot q week   FEN/GI: saline lock, renal and carb mod diet  Prophylaxis: heparin   Disposition: pending clinical improvement adn finalization of ESRD workup   Subjective:  Feels well today. Making urine.   Objective: Temp:  [97.3 F (36.3 C)-98.3 F (36.8 C)] 98.3 F (36.8 C) (03/29 0520) Pulse Rate:  [55-60] 55 (03/29 0520) Resp:  [19-20] 19 (03/29 0520) BP: (136-160)/(53-96) 160/68 mmHg (03/29 0520) SpO2:  [100 %] 100 % (03/29 0520) Weight:  [283 lb 8.2 oz (128.6 kg)] 283 lb 8.2 oz (128.6 kg) (03/29 0520) Physical Exam: General: Seated, brushing teeth, NAD Respiratory: CTAB, normal effort,  CV: RRR, no m/r/g Abdomen: Obese, soft, nontender Extremities: bilateral unna boots in place, visibly a little looser than yesterday Neuro: No focal deficits, normal speech and core tone  Laboratory:  Recent Labs Lab 03/07/14 1909 03/08/14 0506 03/09/14 0349  WBC 12.7* 10.3 9.6  HGB 8.8* 7.7* 8.1*  HCT 26.4* 23.4* 24.8*  PLT 240 222 223    Recent Labs Lab 03/07/14 1110 03/07/14 1909 03/08/14 0506 03/09/14 0319  NA 139  --  140 138  K 4.5  --  4.9 4.4  CL 103  --  105 101  CO2 20  --  21 21  BUN 72*  --  69* 72*  CREATININE 6.20* 5.99* 6.45* 6.57*  CALCIUM 7.7*  --  7.9* 8.1*  GLUCOSE 168*  --  113* 166*    Imaging/Diagnostic Tests:  03/07/14: DG chest 2 view: IMPRESSION: Mild left lower lobe atelectasis/ infiltrate and small left effusion. Electronically Signed By: Franchot Gallo M.D. On: 03/07/2014 11:46    Waldemar Dickens, MD 03/09/2014, 8:26 AM PGY-3, [place] Intern pager: [phone], text pages welcome

## 2014-03-09 NOTE — Progress Notes (Signed)
Seen and examined.  Agree with Dr. Marily Memos.  Appreciate great renal help.  The more we find out during this hospitalization, the more this clinical picture is consistent with progression of chronic medical renal disease.  Cont diuresis.  For vein mapping and likely fistula - hopefully before DC.

## 2014-03-09 NOTE — Progress Notes (Signed)
Patient resting quietly with no complaints. 

## 2014-03-09 NOTE — Progress Notes (Signed)
No complaints of pain or shortness of breath. Rested quietly through the night.

## 2014-03-09 NOTE — Progress Notes (Signed)
Subjective:  Made over 4 liters of urine overnight- down 13 pounds total so far. BP  better- BUN.creatinine for the most part is stable- renal ultrasound without hydro, just c/w medical renal disease- pt feels better, trying to process all of this  Objective Vital signs in last 24 hours: Filed Vitals:   03/08/14 0611 03/08/14 1600 03/08/14 2029 03/09/14 0520  BP: 157/52 136/96 144/53 160/68  Pulse: 51 60 60 55  Temp: 97 F (36.1 C) 97.3 F (36.3 C) 97.8 F (36.6 C) 98.3 F (36.8 C)  TempSrc: Oral Oral Oral Oral  Resp: 18 20  19   Height:      Weight: 131.1 kg (289 lb 0.4 oz)   128.6 kg (283 lb 8.2 oz)  SpO2: 100% 100% 100% 100%   Weight change: -5.937 kg (-13 lb 1.4 oz)  Intake/Output Summary (Last 24 hours) at 03/09/14 [id number] Last data filed at 03/09/14 [id number]  Gross per 24 hour  Intake   1158 ml  Output   3625 ml  Net  -2467 ml    Assessment/Plan: 63 year old BM with HTN, DM, obesity and progressive CKD with a fairly bland U/A  1.Renal- Unfortunately what I think we are seeing here is just progression of CKD over the years of poorly controlled DM and HTN Also , the NSAIDS that he took not that long ago are not helping. Given his bland U/A there is no other work up that needs to be done , renal ultrasound unrevealing. I will check an SPEP but I think that will be low yield. I suspect his volume overload has a lot to do with his CKD and the cramping is likely a symptom as well. Fortunately he does not appear to be having other overwhelming uremic symptoms. I have told him that we are at a severe point with his kidney function. We will need to see how well he responds to aggressive diuresis. Worse case scenario- he needs to start dialysis this admit- best case- he leaves with dialysis education and a fistula in place to follow up as OP. So far, he has diuresed well with not much impact on kidney function- I will order vein mapping tomorrow and call [facility]VS- I have reiterated to patient my  recommendation to proceed with AVF-  2. Hypertension/volume - HTN likely due to volume- on lasix 160 every 8 hours- diuresing well,  norvasc 10/hydralazine and coreg - is better, I will decrease norvasc to avoid hypotension as we get to EDW 3. Anemia - likely related to CKD-  iron stores low, will replete- also on aranesp  4. Bones- PTH pending, will treat as needed - phos 6.4, calc low, will give phoslo    Fedrick Cefalu A    Labs: Basic Metabolic Panel:  Recent Labs Lab 03/04/14 1113  03/07/14 1110 03/07/14 1909 03/08/14 0506 03/09/14 0319  NA 141  --  139  --  140 138  K 4.8  --  4.5  --  4.9 4.4  CL 110  --  103  --  105 101  CO2 19  --  20  --  21 21  GLUCOSE 123*  --  168*  --  113* 166*  BUN 67*  --  72*  --  69* 72*  CREATININE 5.95*  < > 6.20* 5.99* 6.45* 6.57*  CALCIUM 7.4*  --  7.7*  --  7.9* 8.1*  PHOS 6.6*  --   --   --  6.4* 6.6*  < > =  values in this interval not displayed. Liver Function Tests:  Recent Labs Lab 03/04/14 1113 03/08/14 0506 03/09/14 0319  ALBUMIN 3.0* 2.1* 2.5*   No results found for this basename: LIPASE, AMYLASE,  in the last 168 hours No results found for this basename: AMMONIA,  in the last 168 hours CBC:  Recent Labs Lab 03/04/14 1113 03/07/14 1110 03/07/14 1909 03/08/14 0506 03/09/14 0349  WBC 12.2* 12.0* 12.7* 10.3 9.6  HGB 8.5* 8.5* 8.8* 7.7* 8.1*  HCT 25.6* 25.8* 26.4* 23.4* 24.8*  MCV 82.3 84.3 83.5 83.9 83.2  PLT 253 221 240 222 223   Cardiac Enzymes:  Recent Labs Lab 03/07/14 1909  TROPONINI <0.30   CBG:  Recent Labs Lab 03/08/14 0631 03/08/14 1140 03/08/14 1609 03/08/14 2111 03/09/14 0658  GLUCAP 108* 142* 201* 191* 140*    Iron Studies:   Recent Labs  03/07/14 1909  IRON 61  TIBC 249  FERRITIN 204   Studies/Results: Dg Chest 2 View  03/07/2014   CLINICAL DATA:  Short of breath  EXAM: CHEST  2 VIEW  COMPARISON:  08/19/2013  FINDINGS: Heart size and vascularity are normal.  Mild left  lower lobe airspace disease and small left effusion. This may represent atelectasis or possibly pneumonia.  IMPRESSION: Mild left lower lobe atelectasis/ infiltrate and small left effusion.   Electronically Signed   By: Franchot Gallo M.D.   On: 03/07/2014 11:46   US Renal  03/08/2014   CLINICAL DATA:  Acute kidney injury.  Chronic kidney disease.  EXAM: RENAL/URINARY TRACT ULTRASOUND COMPLETE  COMPARISON:  CT, 06/04/2009  FINDINGS: Right Kidney:  Length: 11.2 cm. Increased parenchymal echogenicity. 2 cm simple cyst arises from the lower pole. No other renal masses. No stones. No hydronephrosis.  Left Kidney:  Length: 11.0 cm. Borderline increased parenchymal echogenicity. 19 mm lower pole cyst. No other renal masses. No stones. No hydronephrosis.  Bladder:  Appears normal for degree of bladder distention.  IMPRESSION: 1. Increased renal parenchymal echogenicity consistent with medical renal disease. 2. Single cyst arises from each kidney, from the lower poles. 3. No hydronephrosis.   Electronically Signed   By: Lajean Manes M.D.   On: 03/08/2014 15:40   Medications: Infusions:    Scheduled Medications: . amLODipine  10 mg Oral Daily  . aspirin  81 mg Oral Daily  . atorvastatin  40 mg Oral q1800  . calcium acetate  1,334 mg Oral TID WC  . carvedilol  12.5 mg Oral BID WC  . darbepoetin (ARANESP) injection - NON-DIALYSIS  100 mcg Subcutaneous Q Sat-1800  . ferric gluconate (FERRLECIT/NULECIT) IV  125 mg Intravenous Daily  . furosemide  160 mg Intravenous 3 times per day  . heparin  5,000 Units Subcutaneous 3 times per day  . hydrALAZINE  100 mg Oral BID  . insulin aspart  0-9 Units Subcutaneous TID WC  . sodium chloride  3 mL Intravenous Q12H  . sodium chloride  3 mL Intravenous Q12H    have reviewed scheduled and prn medications.  Physical Exam: General: alert, NAD Heart: RRR Lungs: mostly clear Abdomen: distended Extremities: pitting edema- but better     03/09/2014,9:23 AM  LOS:  2 days

## 2014-03-10 DIAGNOSIS — Z01810 Encounter for preprocedural cardiovascular examination: Secondary | ICD-10-CM

## 2014-03-10 DIAGNOSIS — N184 Chronic kidney disease, stage 4 (severe): Secondary | ICD-10-CM

## 2014-03-10 LAB — RENAL FUNCTION PANEL
Albumin: 2.4 g/dL — ABNORMAL LOW (ref 3.5–5.2)
BUN: 80 mg/dL — ABNORMAL HIGH (ref 6–23)
CALCIUM: 8.3 mg/dL — AB (ref 8.4–10.5)
CO2: 23 meq/L (ref 19–32)
CREATININE: 6.88 mg/dL — AB (ref 0.50–1.35)
Chloride: 100 mEq/L (ref 96–112)
GFR calc non Af Amer: 8 mL/min — ABNORMAL LOW (ref >90)
GFR, EST AFRICAN AMERICAN: 9 mL/min — AB (ref >90)
GLUCOSE: 142 mg/dL — AB (ref 70–99)
Phosphorus: 6.6 mg/dL — ABNORMAL HIGH (ref 2.3–4.6)
Potassium: 4.6 mEq/L (ref 3.7–5.3)
Sodium: 139 mEq/L (ref 137–147)

## 2014-03-10 LAB — GLUCOSE, CAPILLARY
Glucose-Capillary: 129 mg/dL — ABNORMAL HIGH (ref 70–99)
Glucose-Capillary: 133 mg/dL — ABNORMAL HIGH (ref 70–99)
Glucose-Capillary: 157 mg/dL — ABNORMAL HIGH (ref 70–99)
Glucose-Capillary: 180 mg/dL — ABNORMAL HIGH (ref 70–99)

## 2014-03-10 LAB — PTH, INTACT AND CALCIUM
Calcium, Total (PTH): 7.5 mg/dL — ABNORMAL LOW (ref 8.4–10.5)
PTH: 613.9 pg/mL — AB (ref 14.0–72.0)

## 2014-03-10 NOTE — Progress Notes (Signed)
Patient has been engaged by [facility] for telephonic management this hospitalization.  [facility] will not engage at this time.  Confirmed management status with Wallene Huh RN [phone].  Of note, [facility] Care Management services does not replace or interfere with any services that are arranged by inpatient case management or social work.  For additional questions or referrals please contact Corliss Blacker BSN RN [facility] Liaison at [phone].

## 2014-03-10 NOTE — Progress Notes (Signed)
Subjective:   Made another 2 liters of urine yesterday Weight down 7 kg all told if weights believable Had veins mapped Won't let me call [facility] just yet as he is afraid Denies anorexia, nausea or vomiting   Objective Vital signs in last 24 hours: Filed Vitals:   03/09/14 1418 03/09/14 2204 03/10/14 0502 03/10/14 0939  BP: 151/46 147/62 153/53 153/64  Pulse: 56 58 51 54  Temp: 98.2 F (36.8 C) 97.5 F (36.4 C) 98 F (36.7 C) 98.1 F (36.7 C)  TempSrc: Tympanic Oral Oral Oral  Resp: 20 19 18 18   Height:      Weight:   127 kg (279 lb 15.8 oz)   SpO2: 100% 99% 99% 100%   Weight change: -1.6 kg (-3 lb 8.4 oz)  Intake/Output Summary (Last 24 hours) at 03/10/14 1118 Last data filed at 03/10/14 0942  Gross per 24 hour  Intake    963 ml  Output   2275 ml  Net  -1312 ml  I/O last 3 completed shifts: In: 1200 [P.O.:1200] Out: [id number] [Urine:4150] Total I/O In: 243 [P.O.:240; I.V.:3] Out: 300 [Urine:300]  WEIGHT TRENDING 03/10/14 0502 127 kg   03/09/14 0520 128.6 kg  03/08/14 0611 131.1 kg 03/07/14 1615 132.1 kg  03/07/14 1049 134.537 kg   Physical Exam: General: alert, NAD Heart: RRR Lungs: mostly clear Abdomen: distended Extremities: pitting edema- but better - legs wrapped - woody changes bilat Labs: Basic Metabolic Panel:  Recent Labs Lab 03/08/14 0506 03/09/14 0319 03/10/14 0509  NA 140 138 139  K 4.9 4.4 4.6  CL 105 101 100  CO2 21 21 23   GLUCOSE 113* 166* 142*  BUN 69* 72* 80*  CREATININE 6.45* 6.57* 6.88*  CALCIUM 7.9* 8.1* 8.3*  PHOS 6.4* 6.6* 6.6*     Recent Labs Lab 03/08/14 0506 03/09/14 0319 03/10/14 0509  ALBUMIN 2.1* 2.5* 2.4*   Recent Labs Lab 03/04/14 1113 03/07/14 1110 03/07/14 1909 03/08/14 0506 03/09/14 0349  WBC 12.2* 12.0* 12.7* 10.3 9.6  HGB 8.5* 8.5* 8.8* 7.7* 8.1*  HCT 25.6* 25.8* 26.4* 23.4* 24.8*  MCV 82.3 84.3 83.5 83.9 83.2  PLT 253 221 240 222 223   Cardiac Enzymes:  Recent Labs Lab 03/07/14 1909  TROPONINI  <0.30   CBG:  Recent Labs Lab 03/09/14 0658 03/09/14 1108 03/09/14 1616 03/09/14 2135 03/10/14 0640  GLUCAP 140* 185* 155* 155* 129*    Iron Studies:   Recent Labs  03/07/14 1909  IRON 61  TIBC 249  FERRITIN 204   Studies/Results: US Renal  03/08/2014   CLINICAL DATA:  Acute kidney injury.  Chronic kidney disease.  EXAM: RENAL/URINARY TRACT ULTRASOUND COMPLETE  COMPARISON:  CT, 06/04/2009  FINDINGS: Right Kidney:  Length: 11.2 cm. Increased parenchymal echogenicity. 2 cm simple cyst arises from the lower pole. No other renal masses. No stones. No hydronephrosis.  Left Kidney:  Length: 11.0 cm. Borderline increased parenchymal echogenicity. 19 mm lower pole cyst. No other renal masses. No stones. No hydronephrosis.  Bladder:  Appears normal for degree of bladder distention.  IMPRESSION: 1. Increased renal parenchymal echogenicity consistent with medical renal disease. 2. Single cyst arises from each kidney, from the lower poles. 3. No hydronephrosis.   Electronically Signed   By: Lajean Manes M.D.   On: 03/08/2014 15:40   Medications: Infusions:    Scheduled Medications: . amLODipine  5 mg Oral Daily  . aspirin  81 mg Oral Daily  . atorvastatin  40 mg Oral q1800  .  calcium acetate  1,334 mg Oral TID WC  . carvedilol  25 mg Oral BID WC  . darbepoetin (ARANESP) injection - NON-DIALYSIS  100 mcg Subcutaneous Q Sun  . ferric gluconate (FERRLECIT/NULECIT) IV  125 mg Intravenous Daily  . furosemide  160 mg Intravenous 3 times per day  . heparin  5,000 Units Subcutaneous 3 times per day  . hydrALAZINE  100 mg Oral BID  . insulin aspart  0-9 Units Subcutaneous TID WC  . sodium chloride  3 mL Intravenous Q12H  . sodium chloride  3 mL Intravenous Q12H   Assessment/Plan: 63 year old BM with HTN, DM, obesity and progressive CKD with a fairly bland U/A   1.Renal- Unfortunately what we are seeing here is just progression of CKD over the years of poorly controlled DM and HTN  Also  the NSAIDS that he took not that long ago are not helping.  Given his bland U/A there is no other work up that needs to be done; renal ultrasound unrevealing.  SPEP ordered but likely low yield Suspect his volume overload has a lot to do with his CKD and the cramping is likely a symptom as well.  Fortunately he does not appear to be having other overwhelming uremic symptoms.  Has been told that we are at a severe point with his kidney function.  He is diuresing but creatinine is slowly drifting up (not much change in GFR just trending in the wrong direction) Worse case scenario- he needs to start dialysis this admit- best case- he leaves with dialysis education and a fistula in place to follow up as OP.  Vei mapping done - he will let me know when he is agreeable to have [facility] called (hopefully today) 2. Hypertension/volume  HTN likely due to volume- on lasix 160 every 8 hours- diuresing adequately Norvasc decreased to give room for diuresis 3. Anemia - likely related to CKD-  iron stores low, getting ferrlecit load 4. Bones- PTH pending, will treat as needed - phos 6.4, calc low, getting phoslo  Kaelah Hayashi B     03/10/2014,11:18 AM  LOS: 3 days

## 2014-03-10 NOTE — Consult Note (Addendum)
WOC wound consult note Reason for Consult: Consult requested for Una boots to be changed on Wed.  Will plan to remove Una boots, assess wounds, and re-apply on that date as requested. Julien Girt MSN, RN, [place], [place], [place]

## 2014-03-10 NOTE — Consult Note (Signed)
Patient name: [name] MRN: [id number] DOB: 1951-12-05 Sex: male   Referred by: Family medicine  Reason for referral:  Chief Complaint  Patient presents with  . Shortness of Breath    HISTORY OF PRESENT ILLNESS: Very pleasant 63 year old gentleman with a history of chronic renal insufficiency. Admitted with volume overload partially related to chronic renal insufficiency.  Has had good diuresis. We then consult to discuss options for hemodialysis access. The patient is not willing to proceed with this at this time but is willing to consider the options.  Past Medical History  Diagnosis Date  . Diabetes mellitus   . Hypertension   . Hyperlipidemia   . Chronic kidney disease   . PVD (peripheral vascular disease)   . Venous insufficiency   . Morbid obesity   . Chronic cystitis   . Arthritis   . UMBILICAL HERNIA [id number]    Qualifier: History of  By: [name] MD, Audelia Acton    . NEPHROLITHIASIS, HX OF 12/02/2009    Qualifier: Diagnosis of  By: [name], Cat    . Lymphedema     Past Surgical History  Procedure Laterality Date  . R knee arthoscopic repair of meniscus    . Umbilical hernia repair    . Amputation      Right and left fifth toes.   . Popliteal to posterior tibial bypass      2006    History   Social History  . Marital Status: Legally Separated    Spouse Name: N/A    Number of Children: 1  . Years of Education: N/A   Occupational History  .     Social History Main Topics  . Smoking status: Former Smoker    Quit date: 06/06/1979  . Smokeless tobacco: Not on file  . Alcohol Use: No     Comment: occasional  . Drug Use: No  . Sexual Activity: No   Other Topics Concern  . Not on file   Social History Narrative   Lives alone.  Only son is deceased.     Family History  Problem Relation Age of Onset  . Diabetes Mother   . Diabetes Brother     Allergies as of 03/07/2014 - Review Complete 03/07/2014  Allergen Reaction Noted  . Shrimp  [shellfish allergy] Swelling 10/30/2013  . Iodine Swelling     No current facility-administered medications on file prior to encounter.   Current Outpatient Prescriptions on File Prior to Encounter  Medication Sig Dispense Refill  . amLODipine (NORVASC) 10 MG tablet take 1 tablet by mouth once daily  90 tablet  3  . aspirin 81 MG tablet Take 81 mg by mouth daily.        . carvedilol (COREG) 12.5 MG tablet Take 1 tablet (12.5 mg total) by mouth 2 (two) times daily with a meal.  180 tablet  3  . furosemide (LASIX) 80 MG tablet Take 80 mg by mouth 2 (two) times daily. Taking twice a week      . hydrALAZINE (APRESOLINE) 100 MG tablet Take 1 tablet (100 mg total) by mouth 2 (two) times daily. take 1 tablet by mouth twice a day  180 tablet  3  . simvastatin (ZOCOR) 80 MG tablet Take 1 tablet (80 mg total) by mouth daily.  90 tablet  1     REVIEW OF SYSTEMS:  Reviewed in his history and physical with nothing to add all other systems negative except for past medical  history   PHYSICAL EXAMINATION:  General: The patient is a well-nourished male, in no acute distress. Vital signs are BP 139/79  Pulse 52  Temp(Src) 97.8 F (36.6 C) (Oral)  Resp 20  Ht 5\' 11"  (1.803 m)  Wt 279 lb 15.8 oz (127 kg)  BMI 39.07 kg/m2  SpO2 100% Pulmonary: There is a good air exchange   Musculoskeletal: There are no major deformities.  There is no significant extremity pain. Neurologic: No focal weakness or paresthesias are detected, Skin: There are no ulcer or rashes noted. Psychiatric: The patient has normal affect. Cardiovascular: 2+ radial pulses bilaterally Surface veins very small bilaterally.    Vascular Lab Studies: Vein map shows a small veins in the left arm. Does have moderate size basilic vein in his left arm and does have a moderate size cephalic vein at the antecubital space and proximally  Impression and Plan:  I had a long discussion with the patient and his friend who is present. I  described options for hemodialysis access to include temporary catheter, AV fistula an AV graft. I would recommend attempted left AV fistula creation. I would explore the antecubital vein and proceed with upper arm cephalic vein is the size is adequate if not would do the first stage of the basilic vein transposition on the left. Patient is considering his options. Is not willing to proceed with access placement this time. This could be done while he is an inpatient or later as an outpatient. Will follow.    Nylee Barbuto [facility] Office: [phone]

## 2014-03-10 NOTE — Progress Notes (Signed)
Right  Upper Extremity Vein Map    Cephalic  Segment Diameter Depth Comment  1. Axilla 2.68mm mm   2. Mid upper arm 3.31mm mm   3. Above AC 3.52mm mm   4. In AC 2.5mm mm   5. Below AC 11mm mm   6. Mid forearm 39mm mm   7. Wrist 1.42mm mm    mm mm    mm mm    mm mm     Left Upper Extremity Vein Map    Cephalic  Segment Diameter Depth Comment  1. Axilla mm mm   2. Mid upper arm 2.89mm mm Tortuous   3. Above AC 3.29mm mm Tortuous  4. In [facility] 3.45mm mm Tortuous  5. Below AC 7.26mm mm Tortuous  6. Mid forearm mm mm   7. Wrist mm mm    mm mm    mm mm    mm mm    Basilic  Segment Diameter Depth Comment  1. Axilla 8.88mm 29mm   2. Mid upper arm 2.56mm 41mm   3. Above AC 3.34mm 2.5mm   4. In AC 4.46mm 2.45mm   5. Below AC 5.73mm 7.34mm   6. Mid forearm 3.13mm 3.25mm   7. Wrist 2.79mm 2.81mm    mm mm    mm mm    mm mm

## 2014-03-10 NOTE — Progress Notes (Signed)
Attending Addendum  I examined the patient and discussed the assessment and plan with Dr. Sherril Cong. I have reviewed the note and agree.  Patient is doing well today. No complaints except for adjusting to dietary changes. Amenable to AV fistula with the understanding that it was take time to mature and it is best of have access for HD if needed.   Plan to consult vascular surgery. Plan to update Dr. Lorrene Reid regarding this step towards access. I greatly appreciate the care are recommendations  provided by nephrology.     [name], [place]

## 2014-03-10 NOTE — Progress Notes (Signed)
Family Medicine Teaching Service Daily Progress Note Intern Pager: [phone]  Patient name: [name] Medical record number: [id number] Date of birth: 07-26-1951 Age: 63 y.o. Gender: male  Primary Care Provider: Linna Darner, MD Consultants: Cardiology, nephrology Code Status: FULL  Pt Overview and Major Events to Date:  3/27: Admitted with worsened kidney function and fluid overload; renal consulted 3/28: renal US w/ small inf pole cysts bilat  Assessment and Plan: [name] is a 63 y.o. male presenting with shortness of breath, suspected CHF exacerbation. PMH is significant for Diastolic CHF, HTN, PVD, 123456, CKD, MGUS, obesity.   # Dyspnea: Likely due to worsening obesity hypoventilation syndrome, acute on chronic dCHF and CKD/ESRD. Echo 11/19/13 EF 60-65% with grade II diastolic dysfcn, Myoview [id number]. No O2 requirement. BNP down 1604 to 1074 over past 3 days. Electrolytes normal, BUN 72 Cr 6.2 (see below). i-stat 0.02. EKG NSR and trop neg x 1. Received levaquin by ED for possible PNA but this was not continued once pt admitted. 7L net output since admission - continuous cardiac/pulse ox monitoring  - lasix 160mg  IV TID per renal recs.  - PT/OT eval/treat  - continue daily wt/strict ins and outs.   # Hypertensive urgency: BP in ED 180-190s/70s. Pt did not take BP meds that day. Also likely related to fluid overload. Much improved since admission but remains intermittently above goal.  - continue home amlodipine 10mg   - Increase coreg to 25mg  bid (pt w/ only mild dCHF exacerbation so OK to increase dose of bblocker) - Lasix 160mg  TID.   # CKD: worsening creatinine throughout 2014. Pt moving towards ESRD and dialysis.  Appreciate renal recs. Renal US on 3/28 showing small renal cysts in inferior pole bilat.  - awaiting vein mapping. Vascular surgery consulted for AVF - F/u renal recs: lasix 160mg  IV TID. Checking renal US, SPEP.  - continue aranesp.  - continue to trend  creatinine with renal function panel and f/u UOP.  - Order placed to "save left arm" - f/u urine cx  # T2DM: A1c 6.8 in 02/26/14. On glipizide at home.  - hold glipizide  - SSI, 4u correction yesterday   # HLD:  - pt prefers home zocor to lipitor.  - continue lipitor  # Chronic venous insufficiency: Unna boot put on Wednesday with plan for change weekly  - wound care for unna boot q week   FEN/GI: saline lock, renal and carb mod diet  Prophylaxis: heparin   Disposition: pending clinical improvement and finalization of ESRD workup  Subjective:  Feels well today. Reports got vein mapping this am, no results yet  Objective: Temp:  [97.5 F (36.4 C)-98.2 F (36.8 C)] 98 F (36.7 C) (03/30 0502) Pulse Rate:  [51-58] 51 (03/30 0502) Resp:  [18-20] 18 (03/30 0502) BP: (147-160)/(46-70) 153/53 mmHg (03/30 0502) SpO2:  [99 %-100 %] 99 % (03/30 0502) Weight:  [279 lb 15.8 oz (127 kg)] 279 lb 15.8 oz (127 kg) (03/30 0502) Physical Exam: General: Seated, NAD Respiratory: CTAB, normal effort CV: RRR, no m/r/g Abdomen: Obese, soft, nontender Extremities: bilateral unna boots in place,  Neuro: No focal deficits, normal speech and core tone  Laboratory:  Recent Labs Lab 03/07/14 1909 03/08/14 0506 03/09/14 0349  WBC 12.7* 10.3 9.6  HGB 8.8* 7.7* 8.1*  HCT 26.4* 23.4* 24.8*  PLT 240 222 223    Recent Labs Lab 03/08/14 0506 03/09/14 0319 03/10/14 0509  NA 140 138 139  K 4.9 4.4 4.6  CL  105 101 100  CO2 21 21 23   BUN 69* 72* 80*  CREATININE 6.45* 6.57* 6.88*  CALCIUM 7.9* 8.1* 8.3*  GLUCOSE 113* 166* 142*    Imaging/Diagnostic Tests: 03/07/14: DG chest 2 view: Mild left lower lobe atelectasis/ infiltrate and small left effusion.    Beverlyn Roux, MD 03/10/2014, 7:27 AM PGY-1, [place] Intern pager: [phone], text pages welcome

## 2014-03-11 ENCOUNTER — Encounter (HOSPITAL_COMMUNITY): Payer: Self-pay | Admitting: *Deleted

## 2014-03-11 ENCOUNTER — Other Ambulatory Visit: Payer: Self-pay | Admitting: *Deleted

## 2014-03-11 LAB — CBC
HCT: 25.6 % — ABNORMAL LOW (ref 39.0–52.0)
Hemoglobin: 8.4 g/dL — ABNORMAL LOW (ref 13.0–17.0)
MCH: 27.4 pg (ref 26.0–34.0)
MCHC: 32.8 g/dL (ref 30.0–36.0)
MCV: 83.4 fL (ref 78.0–100.0)
Platelets: 209 10*3/uL (ref 150–400)
RBC: 3.07 MIL/uL — ABNORMAL LOW (ref 4.22–5.81)
RDW: 14.1 % (ref 11.5–15.5)
WBC: 10.7 10*3/uL — AB (ref 4.0–10.5)

## 2014-03-11 LAB — RENAL FUNCTION PANEL
ALBUMIN: 2.5 g/dL — AB (ref 3.5–5.2)
BUN: 84 mg/dL — ABNORMAL HIGH (ref 6–23)
CO2: 23 mEq/L (ref 19–32)
Calcium: 8.4 mg/dL (ref 8.4–10.5)
Chloride: 99 mEq/L (ref 96–112)
Creatinine, Ser: 7.04 mg/dL — ABNORMAL HIGH (ref 0.50–1.35)
GFR calc non Af Amer: 7 mL/min — ABNORMAL LOW (ref >90)
GFR, EST AFRICAN AMERICAN: 8 mL/min — AB (ref >90)
Glucose, Bld: 143 mg/dL — ABNORMAL HIGH (ref 70–99)
PHOSPHORUS: 6.6 mg/dL — AB (ref 2.3–4.6)
POTASSIUM: 4.5 meq/L (ref 3.7–5.3)
SODIUM: 137 meq/L (ref 137–147)

## 2014-03-11 LAB — TROPONIN I: Troponin I: <0.3 ng/mL (ref <0.30)

## 2014-03-11 LAB — PROTEIN ELECTROPHORESIS, SERUM
ALPHA-1-GLOBULIN: 6.9 % — AB (ref 2.9–4.9)
ALPHA-2-GLOBULIN: 15.4 % — AB (ref 7.1–11.8)
Albumin ELP: 37.8 % — ABNORMAL LOW (ref 55.8–66.1)
BETA 2: 6.5 % (ref 3.2–6.5)
Beta Globulin: 6.2 % (ref 4.7–7.2)
GAMMA GLOBULIN: 27.2 % — AB (ref 11.1–18.8)
M-Spike, %: 1.17 g/dL
Total Protein ELP: 6.7 g/dL (ref 6.0–8.3)

## 2014-03-11 LAB — GLUCOSE, CAPILLARY
Glucose-Capillary: 136 mg/dL — ABNORMAL HIGH (ref 70–99)
Glucose-Capillary: 136 mg/dL — ABNORMAL HIGH (ref 70–99)
Glucose-Capillary: 173 mg/dL — ABNORMAL HIGH (ref 70–99)

## 2014-03-11 MED ORDER — PANTOPRAZOLE SODIUM 40 MG PO TBEC: 40.0000 mg | DELAYED_RELEASE_TABLET | Freq: Every day | ORAL | Status: DC

## 2014-03-11 MED ORDER — CARVEDILOL 25 MG PO TABS: 25.0000 mg | ORAL_TABLET | Freq: Two times a day (BID) | ORAL | Status: DC

## 2014-03-11 MED ORDER — ATORVASTATIN CALCIUM 40 MG PO TABS: 40.0000 mg | ORAL_TABLET | Freq: Every day | ORAL | Status: DC

## 2014-03-11 MED ORDER — CALCIUM ACETATE 667 MG PO CAPS: 1334.0000 mg | ORAL_CAPSULE | Freq: Three times a day (TID) | ORAL | Status: DC

## 2014-03-11 MED ORDER — AMLODIPINE BESYLATE 10 MG PO TABS
10.0000 mg | ORAL_TABLET | Freq: Every day | ORAL | Status: DC
  Administered 2014-03-11: 10 mg via ORAL
  Filled 2014-03-11 (×2): qty 1

## 2014-03-11 MED ORDER — FUROSEMIDE 80 MG PO TABS: 160.0000 mg | ORAL_TABLET | Freq: Three times a day (TID) | ORAL | Status: DC

## 2014-03-11 MED ORDER — FUROSEMIDE 80 MG PO TABS
160.0000 mg | ORAL_TABLET | Freq: Three times a day (TID) | ORAL | Status: DC
  Administered 2014-03-11: 160 mg via ORAL
  Filled 2014-03-11 (×2): qty 2

## 2014-03-11 NOTE — Progress Notes (Signed)
Nutrition Brief Note  Patient identified on the Malnutrition Screening Tool (MST) Report  Wt Readings from Last 15 Encounters:  03/11/14 278 lb 3.5 oz (126.2 kg)  03/07/14 303 lb (137.44 kg)  03/04/14 302 lb (136.986 kg)  02/28/14 295 lb (133.811 kg)  02/26/14 296 lb (134.265 kg)  01/01/14 296 lb (134.265 kg)  10/30/13 302 lb 1.6 oz (137.032 kg)  10/14/13 302 lb (136.986 kg)  08/18/13 295 lb (133.811 kg)  07/09/13 303 lb (137.44 kg)  01/04/13 301 lb 3.2 oz (136.623 kg)  11/22/12 305 lb 8 oz (138.574 kg)  08/16/12 297 lb (134.718 kg)  11/14/11 319 lb 1.6 oz (144.743 kg)  09/30/11 320 lb (145.151 kg)    Body mass index is 38.82 kg/(m^2). Patient meets criteria for Obesity based on current BMI. Pt relates weight loss to fluid losses. He reports having a good appetite and eating well PTA  Current diet order is Carb Modified/ Renal with 1200 ml fluid restirction, patient is consuming approximately 100% of all meals at this time. Labs and medications reviewed.   No nutrition interventions warranted at this time. If nutrition issues arise, please consult RD.   Pryor Ochoa RD, LDN Inpatient Clinical Dietitian Pager: [phone] After Hours Pager: [phone]

## 2014-03-11 NOTE — Progress Notes (Signed)
UR completed Miyoshi Ligas K. Darly Fails, RN, BSN, [place], CCM  03/11/2014 4:52 PM

## 2014-03-11 NOTE — Progress Notes (Signed)
Attending Addendum  I examined the patient and discussed the assessment and plan with Dr. Adamo. I have reviewed the note and agree.    Jazlynn Nemetz, MD FAMILY MEDICINE TEACHING SERVICE    

## 2014-03-11 NOTE — Progress Notes (Signed)
1400 received a call from Dr. Luther Parody office to remind the pt that will call him before AV shunt  placement  Scheduled for April 10 ,2015. Pt  Informed and agreeable

## 2014-03-11 NOTE — Progress Notes (Signed)
Pt notified RN that he had some "chest discomfort" when coughing this AM. Pt already notified MD. VSS. Will continue to monitor. Ronnette Hila, RN

## 2014-03-11 NOTE — Progress Notes (Signed)
i cosign with Demarcus Steward on all assessments, medication administration and documentation for this shift.

## 2014-03-11 NOTE — Progress Notes (Signed)
Subjective:   Had some chest discomfort Sounds like GERD Trops ordered PPI started [facility] has seen for access Pt apparently would not commit  Tells me he will go ahead whenever Dr. Donnetta Hutching wants to  Objective Vital signs in last 24 hours: Filed Vitals:   03/10/14 2119 03/11/14 0445 03/11/14 0803 03/11/14 1000  BP: 152/60 150/56 150/56 147/62  Pulse:  52 52   Temp:  97.6 F (36.4 C) 97.8 F (36.6 C)   TempSrc:  Oral Oral   Resp:  18 16   Height:      Weight:  126.2 kg (278 lb 3.5 oz)    SpO2:  99% 99%    Weight change: -0.8 kg (-1 lb 12.2 oz)  Intake/Output Summary (Last 24 hours) at 03/11/14 1216 Last data filed at 03/11/14 1000  Gross per 24 hour  Intake   1080 ml  Output   2875 ml  Net  -1795 ml  I/O last 3 completed shifts: In: [id number] [P.O.:840; I.V.:3] Out: 3950 [Urine:3950] Total I/O In: 720 [P.O.:720] Out: 600 [Urine:600]  WEIGHT TRENDING 03/11/14 0445 126.2 03/10/14 0502 127 kg   03/09/14 0520 128.6 kg  03/08/14 0611 131.1 kg 03/07/14 1615 132.1 kg  03/07/14 1049 134.537 kg   Physical Exam: General: alert, NAD Heart: RRR No rub Lungs: clear Abdomen: distended Extremities: pitting edema - legs wrapped - woody changes bilat  Labs: Basic Metabolic Panel:  Recent Labs Lab 03/09/14 0319 03/10/14 0509 03/11/14 0550  NA 138 139 137  K 4.4 4.6 4.5  CL 101 100 99  CO2 21 23 23   GLUCOSE 166* 142* 143*  BUN 72* 80* 84*  CREATININE 6.57* 6.88* 7.04*  CALCIUM 8.1* 8.3* 8.4  PHOS 6.6* 6.6* 6.6*     Recent Labs Lab 03/09/14 0319 03/10/14 0509 03/11/14 0550  ALBUMIN 2.5* 2.4* 2.5*    Recent Labs Lab 03/07/14 1110 03/07/14 1909 03/08/14 0506 03/09/14 0349 03/11/14 0550  WBC 12.0* 12.7* 10.3 9.6 10.7*  HGB 8.5* 8.8* 7.7* 8.1* 8.4*  HCT 25.8* 26.4* 23.4* 24.8* 25.6*  MCV 84.3 83.5 83.9 83.2 83.4  PLT 221 240 222 223 209    Results for [name] (MRN [id number]) as of 03/11/2014 12:21  Ref. Range 03/07/2014 19:09  PTH Latest Range:  14.0-72.0 pg/mL 613.9 (H)   Cardiac Enzymes:  Recent Labs Lab 03/07/14 1909  TROPONINI <0.30   CBG:  Recent Labs Lab 03/10/14 1201 03/10/14 1628 03/10/14 2132 03/11/14 0620 03/11/14 1107  GLUCAP 133* 157* 180* 136* 136*   Medications: Infusions:    Scheduled Medications: . amLODipine  10 mg Oral Daily  . aspirin  81 mg Oral Daily  . atorvastatin  40 mg Oral q1800  . calcium acetate  1,334 mg Oral TID WC  . carvedilol  25 mg Oral BID WC  . darbepoetin (ARANESP) injection - NON-DIALYSIS  100 mcg Subcutaneous Q Sun  . furosemide  160 mg Intravenous 3 times per day  . heparin  5,000 Units Subcutaneous 3 times per day  . hydrALAZINE  100 mg Oral BID  . insulin aspart  0-9 Units Subcutaneous TID WC  . pantoprazole  40 mg Oral QHS  . sodium chloride  3 mL Intravenous Q12H  . sodium chloride  3 mL Intravenous Q12H   Assessment/Plan:  63 year old BM with HTN, DM, obesity and progressive CKD with a fairly bland U/A   Renal Unfortunately what we are seeing here is just progression of CKD over the years of  poorly controlled DM and HTN  Also the NSAIDS that he took not that long ago are not helping.  Given his bland U/A there is no other work up that needs to be done; renal ultrasound unrevealing.  SPEP restricted band - immunofixation ordered Fortunately he does not appear to be having other overwhelming uremic symptoms.  Has been told that we are at a severe point with his kidney function.  He is diuresing but creatinine is slowly drifting up (not much change in GFR just trending in the wrong direction) Worse case scenario- he needs to start dialysis this admit- best case- he leaves with dialysis education and a fistula in place to follow up as OP.  He tells me he is agreeable to AVF whenever Dr. Donnetta Hutching wants to do it - have tried to page and call Dr. Donnetta Hutching just now with no success. Would be nice if AVF coould be done before D/C but their holiday scheduling may  preclude  Hypertension/volume  HTN likely due to volume- on lasix 160 every 8 hours- diuresing adequately Norvasc decreased to give room for diuresis Change to 160 po TID of lasix  Anemia - likely related to CKD-  iron stores low, getting ferrlecit load; Darbe 100 QSun Bones PTH 613 Start calcitriol 0.25 QD Continue phoslo 2 TID with meals  [name] B   03/11/2014,12:16 PM  LOS: 4 days

## 2014-03-11 NOTE — Care Management Note (Addendum)
  Page 1 of 1   03/11/2014     4:51:38 PM   CARE MANAGEMENT NOTE 03/11/2014  Patient:  [name], [name]   Account Number:  [id number]  Date Initiated:  03/11/2014  Documentation initiated by:  Nickolai Rinks  Subjective/Objective Assessment:   Admitted with shortness of breath / sent by MD.     Action/Plan:   CM to follow for disposition needs   Anticipated DC Date:  03/11/2014   Anticipated DC Plan:  HOME/SELF CARE         Choice offered to / List presented to:             Status of service:  In process, will continue to follow Medicare Important Message given?   (If response is "NO", the following Medicare IM given date fields will be blank) Date Medicare IM given:   Date Additional Medicare IM given:    Discharge Disposition:    Per UR Regulation:  Reviewed for med. necessity/level of care/duration of stay  If discussed at [place] of Stay Meetings, dates discussed:   03/11/2014    Comments:  03/11/2014 BUN: 84 (H) Creatinine: 7.04 (H) IV Lasix changed to 160mg  po tid PT RECS:  None Disposition Plan:  pending. Emmely Bittinger RN, BSN, [place], [place] 03/11/2014

## 2014-03-11 NOTE — Progress Notes (Signed)
1820 Discharge instructions given to pt and spouse . Verbalized understanding . To lobby wheeled to lobby by NT

## 2014-03-11 NOTE — Progress Notes (Signed)
Subjective: Interval History: none. Reports some chest soreness between 3 and 4 AM last night and the night before. I have scanned to notify his family practice physician when they make rounds this morning currently in no distress.   Objective: Vital signs in last 24 hours: Temp:  [97.6 F (36.4 C)-98.1 F (36.7 C)] 97.6 F (36.4 C) (03/31 0445) Pulse Rate:  [52-54] 52 (03/31 0445) Resp:  [18-20] 18 (03/31 0445) BP: (135-153)/(51-79) 150/56 mmHg (03/31 0445) SpO2:  [99 %-100 %] 99 % (03/31 0445) Weight:  [278 lb 3.5 oz (126.2 kg)] 278 lb 3.5 oz (126.2 kg) (03/31 0445)  Intake/Output from previous day: 03/30 0701 - 03/31 0700 In: 603 [P.O.:600; I.V.:3] Out: 2975 [Urine:2975] Intake/Output this shift: Total I/O In: -  Out: 2075 [Urine:2075]  2+ radial pulses bilaterally. Small surface veins  Lab Results:  Recent Labs  03/09/14 0349 03/11/14 0550  WBC 9.6 10.7*  HGB 8.1* 8.4*  HCT 24.8* 25.6*  PLT 223 209   BMET  Recent Labs  03/09/14 0319 03/10/14 0509  NA 138 139  K 4.4 4.6  CL 101 100  CO2 21 23  GLUCOSE 166* 142*  BUN 72* 80*  CREATININE 6.57* 6.88*  CALCIUM 8.1* 8.3*    Studies/Results: Dg Chest 2 View  03/07/2014   CLINICAL DATA:  Short of breath  EXAM: CHEST  2 VIEW  COMPARISON:  08/19/2013  FINDINGS: Heart size and vascularity are normal.  Mild left lower lobe airspace disease and small left effusion. This may represent atelectasis or possibly pneumonia.  IMPRESSION: Mild left lower lobe atelectasis/ infiltrate and small left effusion.   Electronically Signed   By: Franchot Gallo M.D.   On: 03/07/2014 11:46   US Renal  03/08/2014   CLINICAL DATA:  Acute kidney injury.  Chronic kidney disease.  EXAM: RENAL/URINARY TRACT ULTRASOUND COMPLETE  COMPARISON:  CT, 06/04/2009  FINDINGS: Right Kidney:  Length: 11.2 cm. Increased parenchymal echogenicity. 2 cm simple cyst arises from the lower pole. No other renal masses. No stones. No hydronephrosis.  Left Kidney:   Length: 11.0 cm. Borderline increased parenchymal echogenicity. 19 mm lower pole cyst. No other renal masses. No stones. No hydronephrosis.  Bladder:  Appears normal for degree of bladder distention.  IMPRESSION: 1. Increased renal parenchymal echogenicity consistent with medical renal disease. 2. Single cyst arises from each kidney, from the lower poles. 3. No hydronephrosis.   Electronically Signed   By: Lajean Manes M.D.   On: 03/08/2014 15:40   Anti-infectives: Anti-infectives   Start     Dose/Rate Route Frequency Ordered Stop   03/07/14 1300  levofloxacin (LEVAQUIN) IVPB 750 mg     750 mg 100 mL/hr over 90 Minutes Intravenous  Once 03/07/14 1257 03/07/14 1449      Assessment/Plan: s/p * No surgery found * Again discussed the need for hemodialysis access with the patient. Discuss results of his vein from yesterday. He is right-handed. He has marginal veins in the cephalic and basilic system on the left arm at the antecubital space. Would recommend fistula attempt with a decision between upper arm cephalic vein fistula versus first stage of basilic vein transposition made at the time of surgery. He is still considering his options is not willing to plan surgery. will follow from side lines.   LOS: 4 days   EARLY, TODD 03/11/2014, 6:27 AM

## 2014-03-11 NOTE — Progress Notes (Signed)
Family Medicine Teaching Service Daily Progress Note Intern Pager: [phone]  Patient name: [name] Medical record number: [id number] Date of birth: 25-Jul-1951 Age: 63 y.o. Gender: male  Primary Care Provider: Linna Darner, MD Consultants: Cardiology, nephrology Code Status: FULL  Pt Overview and Major Events to Date:  3/27: Admitted with worsened kidney function and fluid overload; renal consulted 3/28: renal US w/ small inf pole cysts bilat  Assessment and Plan: [name] is a 63 y.o. male presenting with shortness of breath, suspected CHF exacerbation. PMH is significant for Diastolic CHF, HTN, PVD, 123456, CKD, MGUS, obesity.   # Chest pain: likely GERD, no typical features, started >24 hours ago - trop x1 - EKG - start PPI  # CKD: worsening creatinine throughout 2014. Pt moving towards ESRD and dialysis.  Appreciate renal recs. Renal US on 3/28 showing small renal cysts in inferior pole bilat.  - awaiting vein mapping. Vascular surgery consulted for AVF - F/u renal recs: lasix 160mg  IV TID. - continue aranesp.  - continue to trend creatinine with renal function panel and f/u UOP.  - continue daily wt/strict ins and outs. - Order placed to "save left arm" - f/u urine cx - patient still refusing AVF at this point - plan to d/c home pending nephrology recs about transition to po diuretics  # Dyspnea: resolved   # Hypertensive urgency: BP in ED 180-190s/70s. Pt did not take BP meds that day. Also likely related to fluid overload. Much improved since admission but remains intermittently above goal.  - continue amlodipine 10mg  and hydralazine 100 bid - Increase coreg to 25mg  bid (pt w/ only mild dCHF exacerbation so OK to increase dose of bblocker) - Lasix 160mg  TID.   # T2DM: A1c 6.8 in 02/26/14. On glipizide at home.  - hold glipizide  - SSI, 5u correction yesterday   # HLD:  - pt prefers home zocor to lipitor.  - continue lipitor  # Chronic venous  insufficiency: Unna boot put on Wednesday with plan for change weekly  - wound care for unna boot q week   FEN/GI: saline lock, renal and carb mod diet  Prophylaxis: heparin   Disposition: home today if still unwilling to get AVF and renal recommends po diuretics  Subjective: Complaining of chest pain today and yesterday morning which comes on when he is lying in bed and resolves after sitting up. It is an ache or discomfort located in the left upper chest. Not exertional, not relieved by rest, not substernal pressure. Still anxious about AVF and only wants if he will definitely need it.  Objective: Temp:  [97.6 F (36.4 C)-98.1 F (36.7 C)] 97.6 F (36.4 C) (03/31 0445) Pulse Rate:  [52-54] 52 (03/31 0445) Resp:  [18-20] 18 (03/31 0445) BP: (135-153)/(51-79) 150/56 mmHg (03/31 0445) SpO2:  [99 %-100 %] 99 % (03/31 0445) Weight:  [278 lb 3.5 oz (126.2 kg)] 278 lb 3.5 oz (126.2 kg) (03/31 0445) Physical Exam: General: Seated, NAD Respiratory: CTAB, normal effort CV: RRR, no m/r/g Abdomen: Obese, soft, nontender Extremities: bilateral unna boots in place,  Neuro: No focal deficits, normal speech and core tone  Laboratory:  Recent Labs Lab 03/08/14 0506 03/09/14 0349 03/11/14 0550  WBC 10.3 9.6 10.7*  HGB 7.7* 8.1* 8.4*  HCT 23.4* 24.8* 25.6*  PLT 222 223 209    Recent Labs Lab 03/09/14 0319 03/10/14 0509 03/11/14 0550  NA 138 139 137  K 4.4 4.6 4.5  CL 101 100 99  CO2 21 23 23   BUN 72* 80* 84*  CREATININE 6.57* 6.88* 7.04*  CALCIUM 8.1* 8.3* 8.4  GLUCOSE 166* 142* 143*    Imaging/Diagnostic Tests: 03/07/14: DG chest 2 view: Mild left lower lobe atelectasis/ infiltrate and small left effusion.   Renal ultrasound: 1. Increased renal parenchymal echogenicity consistent with medical renal disease.  2. Single cyst arises from each kidney, from the lower poles.  3. No hydronephrosis.   Beverlyn Roux, MD 03/11/2014, 8:06 AM PGY-1, [place] Intern pager: [phone], text pages welcome

## 2014-03-11 NOTE — Discharge Summary (Signed)
[facility] Discharge Summary  Patient name: [name] Medical record number: [id number] Date of birth: 03/08/1951 Age: 63 y.o. Gender: male Date of Admission: 03/07/2014  Date of Discharge: 03/11/14 Admitting Physician: Zigmund Gottron, MD  Primary Care Provider: Linna Darner, MD Consultants: cardiology, nephrology  Indication for Hospitalization: kidney failure, volume overload  Discharge Diagnoses/Problem List:  Patient Active Problem List   Diagnosis Date Noted  . Acute kidney injury 03/08/2014  . Acute exacerbation of CHF (congestive heart failure) 03/07/2014  . CHF exacerbation 03/07/2014  . AKI (acute kidney injury) 03/07/2014  . Stasis dermatitis 02/26/2014  . Diastolic CHF [id number]  . Right leg swelling 08/19/2013  . Special screening for malignant neoplasms, colon 07/09/2013  . Knee pain 08/21/2012  . Mass of thigh 08/21/2012  . ORGANIC IMPOTENCE 12/02/2009  . ONYCHOMYCOSIS 07/17/2009  . CHRONIC KIDNEY DISEASE STAGE III (MODERATE) 06/09/2008  . VENOUS INSUFFICIENCY, LEGS 04/13/2007  . Hyperlipidemia LDL goal < 100 03/19/2007  . DIABETES MELLITUS, II, COMPLICATIONS [id number]  . OBESITY, MORBID 02/08/2007  . HYPERTENSION, BENIGN SYSTEMIC 02/08/2007  . PERIPHERAL VASCULAR DISEASE, UNSPEC. 02/08/2007    Disposition: home  Discharge Condition: improved  Brief Hospital Course: [name] is a 63 y.o. male presenting with shortness of breath, suspected CHF exacerbation. PMH is significant for Diastolic CHF, HTN, PVD, [id number], CKD, MGUS, obesity.   # Chest pain: On the day prior to discharge the patient developed chest pain while lying down that resolved when sitting up. Troponin was negative and an EKG was unchanged. He was placed on a PPI for presumed GERD.  # CKD: The patient was admitted for progressive worsening of his kidney disease leading to volume overload and dyspnea. He improved significantly with aggressive  diuresis and did not meet criteria for emergent dialysis. It was recommended that an AVF be placed for inevitable dialysis in the near future. The patient was resistent but eventually consented. Vascular surgery was unable to schedule him within a few days so he was scheduled for outpatient follow-up with them and surgery next week.   # Hypertensive urgency: His elevated blood pressure was thought to be primarily caused by his volume overload. It improved with diuresis and restarting his home amlodipine, hydralazine and coreg. His coreg was increased to 25mg  for tighter control.  # T2DM: The patient's glipizide was held while inpatient and his glucose was controlled with sliding scale insulin. His glipizide was restarted on discharge.  # HLD: The patient's zocor was switched to lipitor for high intensity given his other risk factors.  # Chronic venous insufficiency: The patient had an unna boot put on Wednesday with plan for change weekly by wound care.  Issues for Follow Up:  # AVF: Patient should have appointment scheduled with vascular and plan for AVF placement next week.  # CKD: Monitor electrolytes and fluid status closely as need for dialysis is likely imminent.  Significant Procedures: vein mapping  Significant Labs and Imaging:   Recent Labs Lab 03/08/14 0506 03/09/14 0349 03/11/14 0550  WBC 10.3 9.6 10.7*  HGB 7.7* 8.1* 8.4*  HCT 23.4* 24.8* 25.6*  PLT 222 223 209    Recent Labs Lab 03/07/14 1110 03/07/14 1909 03/08/14 0506 03/09/14 0319 03/10/14 0509 03/11/14 0550  NA 139  --  140 138 139 137  K 4.5  --  4.9 4.4 4.6 4.5  CL 103  --  105 101 100 99  CO2 20  --  21 21 23  23  GLUCOSE 168*  --  113* 166* 142* 143*  BUN 72*  --  69* 72* 80* 84*  CREATININE 6.20* 5.99* 6.45* 6.57* 6.88* 7.04*  CALCIUM 7.7* 7.5* 7.9* 8.1* 8.3* 8.4  PHOS  --   --  6.4* 6.6* 6.6* 6.6*  ALBUMIN  --   --  2.1* 2.5* 2.4* 2.5*   03/07/14: DG chest 2 view: Mild left lower lobe  atelectasis/ infiltrate and small left effusion.   Renal ultrasound:  1. Increased renal parenchymal echogenicity consistent with medical renal disease.  2. Single cyst arises from each kidney, from the lower poles.  3. No hydronephrosis.  Results/Tests Pending at Time of Discharge: none  Discharge Medications:    Medication List    STOP taking these medications       simvastatin 80 MG tablet  Commonly known as:  ZOCOR      TAKE these medications       amLODipine 10 MG tablet  Commonly known as:  NORVASC  take 1 tablet by mouth once daily     aspirin 81 MG tablet  Take 81 mg by mouth daily.     atorvastatin 40 MG tablet  Commonly known as:  LIPITOR  Take 1 tablet (40 mg total) by mouth daily at 6 PM.     calcium acetate 667 MG capsule  Commonly known as:  PHOSLO  Take 2 capsules (1,334 mg total) by mouth 3 (three) times daily with meals.     carvedilol 25 MG tablet  Commonly known as:  COREG  Take 1 tablet (25 mg total) by mouth 2 (two) times daily with a meal.     furosemide 80 MG tablet  Commonly known as:  LASIX  Take 2 tablets (160 mg total) by mouth 3 (three) times daily.     glipiZIDE 10 MG tablet  Commonly known as:  GLUCOTROL  Take 10 mg by mouth daily before breakfast. Hold for sugar <98     hydrALAZINE 100 MG tablet  Commonly known as:  APRESOLINE  Take 1 tablet (100 mg total) by mouth 2 (two) times daily. take 1 tablet by mouth twice a day        Discharge Instructions: Please refer to Patient Instructions section of EMR for full details.  Patient was counseled important signs and symptoms that should prompt return to medical care, changes in medications, dietary instructions, activity restrictions, and follow up appointments.   Follow-Up Appointments: Follow-up Information   Follow up with MERRELL, DAVID, MD.   Specialty:  Family Medicine   Contact information:   [address] [place] [address] [phone]       Follow up with  DUNHAM,CYNTHIA B, MD. Schedule an appointment as soon as possible for a visit in 1 week.   Specialty:  Nephrology   Contact information:   [place] [address] [phone]       Call EARLY, TODD, MD.   Specialty:  Vascular Surgery   Contact information:   [place] [address] [phone]       Beverlyn Roux, MD 03/13/2014, 10:57 AM PGY-1, [place]

## 2014-03-11 NOTE — Discharge Instructions (Signed)
Renal Diet, Pre-Dialysis Chronic kidney disease (CKD) is when the kidneys are not working the way they should. There are 5 stages of kidney disease. Your meal plan will depend on the stage you are diagnosed with. Food can make you healthier and keep your kidneys working well. Use this sheet to help you learn how to eat right to feel right.  A Registered Dietitian can help you with this meal plan. Write down your dietician's name and phone number. HOW DOES FOOD AFFECT MY KIDNEYS? Food gives you energy and helps your body repair itself. Food is broken down in your stomach and intestines. Your blood picks up nutrients from the digested food and carries them to all your body cells. These cells take nutrients from your blood and put waste products back into the bloodstream. When your kidneys were healthy, they constantly removed wastes from your blood. The wastes left your body when you urinated or when you had bowel movements. Now that your kidneys cannot remove wastes from food, you will need to eat fewer foods that cause waste buildup in the body.  FLUIDS Most people with CKD do not need to restrict fluid. However, if you are retaining fluid in your legs, your caregiver may recommend restricting fluids. Ask and record the amount of fluid you can have each day if instructed by your caregiver. POTASSIUM  Potassium is a mineral found in many foods, especially milk, fruits, and vegetables. It affects how steadily your heart beats. Healthy kidneys keep the right amount of potassium in the blood to keep the heart beating at a steady pace. You may need to restrict potassium. Talk to your caregiver or dietitian to learn more about your potassium needs. If you do need to reduce the potassium in your diet, start by noting the high-potassium foods (below) that you now eat. A dietitian can help you add other foods to the list and tell you how much of the foods below to eat.  High-Potassium  Foods  Apricots.  Brussels sprouts.  Dates.  Lima beans.  Oranges.  Prune juice.  Spinach.  Avocados.  Milk.  Figs.  Melons.  Peanuts.  Prunes.  Tomatoes.  Bananas.  Cantaloupe.  Kiwi fruit.  Nectarines.  Asparagus spears.  Raisins.  Winter squash.  Beets.  Clams.  Fruit.  Orange juice.  Potatoes.  Sardines.  Yogurt. PHOSPHORUS  Phosphorus is a mineral found in many foods. If you have too much phosphorus in your blood, it pulls calcium from your bones. Losing calcium will make your bones weak and likely to break. Also, too much phosphorus may make your skin itch. Avoid foods like milk and cheese, dried beans, peas, colas, nuts, and peanut butter, which are high in phosphorus.  Your needs will depend on your kidney's ability to use phosphorous. Your dietitian can tell you how much of these foods to eat. PROTEIN  It is important to follow a low-protein diet. A lower protein diet will slow the rate of your kidney failure.  Protein helps you keep muscle and repair tissue. In your body, the protein you eat breaks down into a waste product called urea. If urea builds up in your blood, you can become very sick. Ask and record how many servings of meat, fish, chicken, or eggs you may eat per day. Your dietitian may give you a specific number of grams of protein to eat daily.One ounce of meat, fish, chicken, or 1 egg is equal to 7 g. A regular serving size is about  the size of the palm of your hand or a deck of cards and is 3 oz in weight and 21 g of protein.  SODIUM  Sodium is found in salt and other foods. Most canned foods and frozen dinners contain large amounts of sodium.  Try to eat fresh foods that are naturally low in sodium. Look for products labeled "low sodium."  Do not use salt substitutes because they contain potassium. Talk to a dietitian about spices you can use to flavor your food. The dietitian can help you find spice blends without  sodium or potassium. VITAMINS AND MINERALS   Take only the vitamins that your caregiver prescribes.  Vitamins and minerals may be missing from your diet because you need to avoid so many foods. Your caregiver may prescribe a vitamin and mineral supplement to meet your needs. Document Released: 02/18/2003 Document Revised: 02/20/2012 Document Reviewed: 02/25/2011 [facility] Patient Information 2014 [place], [place].

## 2014-03-12 ENCOUNTER — Encounter (HOSPITAL_BASED_OUTPATIENT_CLINIC_OR_DEPARTMENT_OTHER): Payer: Medicare Other | Attending: General Surgery

## 2014-03-12 DIAGNOSIS — L97809 Non-pressure chronic ulcer of other part of unspecified lower leg with unspecified severity: Secondary | ICD-10-CM | POA: Insufficient documentation

## 2014-03-12 DIAGNOSIS — I872 Venous insufficiency (chronic) (peripheral): Secondary | ICD-10-CM | POA: Insufficient documentation

## 2014-03-12 DIAGNOSIS — I129 Hypertensive chronic kidney disease with stage 1 through stage 4 chronic kidney disease, or unspecified chronic kidney disease: Secondary | ICD-10-CM | POA: Insufficient documentation

## 2014-03-12 DIAGNOSIS — N189 Chronic kidney disease, unspecified: Secondary | ICD-10-CM | POA: Insufficient documentation

## 2014-03-12 DIAGNOSIS — E119 Type 2 diabetes mellitus without complications: Secondary | ICD-10-CM | POA: Insufficient documentation

## 2014-03-13 LAB — IMMUNOFIXATION ELECTROPHORESIS
IGA: 350 mg/dL (ref 68–379)
IGG (IMMUNOGLOBIN G), SERUM: 2240 mg/dL — AB (ref 650–1600)
IgM, Serum: 65 mg/dL (ref 41–251)
Total Protein ELP: 7.2 g/dL (ref 6.0–8.3)

## 2014-03-13 NOTE — Discharge Summary (Signed)
Attending Addendum  I examined the patient and discussed the discharge plan with Dr. Adamo. I have reviewed the note and agree.    Doug Bucklin, MD FAMILY MEDICINE TEACHING SERVICE      

## 2014-03-18 ENCOUNTER — Encounter (HOSPITAL_COMMUNITY): Payer: Self-pay | Admitting: *Deleted

## 2014-03-20 MED ORDER — DEXTROSE 5 % IV SOLN
1.5000 g | INTRAVENOUS | Status: AC
  Administered 2014-03-21: 1.5 g via INTRAVENOUS
  Filled 2014-03-20: qty 1.5

## 2014-03-20 NOTE — Progress Notes (Signed)
Pt notified of arrival time of 0730.No really happy with time but verbalized understanding

## 2014-03-21 ENCOUNTER — Ambulatory Visit (HOSPITAL_COMMUNITY)
Admission: RE | Admit: 2014-03-21 | Discharge: 2014-03-21 | Disposition: A | Discharge status: Home / Self Care | Payer: Medicare Other | Source: Ambulatory Visit | Attending: Vascular Surgery | Admitting: Vascular Surgery

## 2014-03-21 ENCOUNTER — Telehealth: Payer: Self-pay | Admitting: Vascular Surgery

## 2014-03-21 ENCOUNTER — Encounter (HOSPITAL_COMMUNITY): Payer: Self-pay | Admitting: *Deleted

## 2014-03-21 ENCOUNTER — Encounter (HOSPITAL_COMMUNITY)
Admission: RE | Disposition: A | Discharge status: Home / Self Care | Payer: Self-pay | Source: Ambulatory Visit | Attending: Vascular Surgery

## 2014-03-21 ENCOUNTER — Ambulatory Visit (HOSPITAL_COMMUNITY): Payer: Medicare Other | Admitting: Certified Registered"

## 2014-03-21 ENCOUNTER — Encounter (HOSPITAL_COMMUNITY): Payer: Medicare Other | Admitting: Certified Registered"

## 2014-03-21 DIAGNOSIS — K449 Diaphragmatic hernia without obstruction or gangrene: Secondary | ICD-10-CM | POA: Insufficient documentation

## 2014-03-21 DIAGNOSIS — N189 Chronic kidney disease, unspecified: Secondary | ICD-10-CM | POA: Insufficient documentation

## 2014-03-21 DIAGNOSIS — I129 Hypertensive chronic kidney disease with stage 1 through stage 4 chronic kidney disease, or unspecified chronic kidney disease: Secondary | ICD-10-CM | POA: Insufficient documentation

## 2014-03-21 DIAGNOSIS — N184 Chronic kidney disease, stage 4 (severe): Secondary | ICD-10-CM

## 2014-03-21 DIAGNOSIS — I509 Heart failure, unspecified: Secondary | ICD-10-CM | POA: Insufficient documentation

## 2014-03-21 DIAGNOSIS — I739 Peripheral vascular disease, unspecified: Secondary | ICD-10-CM | POA: Insufficient documentation

## 2014-03-21 DIAGNOSIS — N183 Chronic kidney disease, stage 3 unspecified: Secondary | ICD-10-CM

## 2014-03-21 DIAGNOSIS — J45909 Unspecified asthma, uncomplicated: Secondary | ICD-10-CM | POA: Insufficient documentation

## 2014-03-21 DIAGNOSIS — Z87891 Personal history of nicotine dependence: Secondary | ICD-10-CM | POA: Insufficient documentation

## 2014-03-21 HISTORY — DX: Personal history of other diseases of the digestive system: Z87.19

## 2014-03-21 HISTORY — DX: Unspecified asthma, uncomplicated: J45.909

## 2014-03-21 HISTORY — PX: AV FISTULA PLACEMENT: SHX1204

## 2014-03-21 LAB — GLUCOSE, CAPILLARY
GLUCOSE-CAPILLARY: 76 mg/dL (ref 70–99)
Glucose-Capillary: 59 mg/dL — ABNORMAL LOW (ref 70–99)
Glucose-Capillary: 94 mg/dL (ref 70–99)
Glucose-Capillary: 86 mg/dL (ref 70–99)
Glucose-Capillary: 71 mg/dL (ref 70–99)
Glucose-Capillary: 66 mg/dL — ABNORMAL LOW (ref 70–99)
Glucose-Capillary: 99 mg/dL (ref 70–99)

## 2014-03-21 LAB — POCT I-STAT 4, (NA,K, GLUC, HGB,HCT)
Glucose, Bld: 57 mg/dL — ABNORMAL LOW (ref 70–99)
HEMATOCRIT: 30 % — AB (ref 39.0–52.0)
Hemoglobin: 10.2 g/dL — ABNORMAL LOW (ref 13.0–17.0)
Potassium: 5.8 mEq/L — ABNORMAL HIGH (ref 3.7–5.3)
Sodium: 139 mEq/L (ref 137–147)

## 2014-03-21 SURGERY — ARTERIOVENOUS (AV) FISTULA CREATION
Anesthesia: Monitor Anesthesia Care | Site: Arm Lower | Laterality: Left

## 2014-03-21 MED ORDER — DEXTROSE 50 % IV SOLN
25.0000 mL | Freq: Once | INTRAVENOUS | Status: AC
Start: 2014-03-21 — End: 2014-03-21
  Administered 2014-03-21: 25 mL via INTRAVENOUS
  Filled 2014-03-21: qty 50

## 2014-03-21 MED ORDER — DEXTROSE 50 % IV SOLN
INTRAVENOUS | Status: AC
  Administered 2014-03-21: 25 mL via INTRAVENOUS
  Filled 2014-03-21: qty 50

## 2014-03-21 MED ORDER — SODIUM CHLORIDE 0.9 % IV SOLN
INTRAVENOUS | Status: DC
  Administered 2014-03-21: 12:00:00 via INTRAVENOUS

## 2014-03-21 MED ORDER — MIDAZOLAM HCL 5 MG/5ML IJ SOLN
INTRAMUSCULAR | Status: DC | PRN
  Administered 2014-03-21: 2 mg via INTRAVENOUS

## 2014-03-21 MED ORDER — PROPOFOL 10 MG/ML IV BOLUS
INTRAVENOUS | Status: DC | PRN
  Administered 2014-03-21: 100 mg via INTRAVENOUS

## 2014-03-21 MED ORDER — CHLORHEXIDINE GLUCONATE CLOTH 2 % EX PADS: 6.0000 | MEDICATED_PAD | Freq: Once | CUTANEOUS | Status: DC

## 2014-03-21 MED ORDER — MIDAZOLAM HCL 2 MG/2ML IJ SOLN: 1.0000 mg | INTRAMUSCULAR | Status: DC | PRN

## 2014-03-21 MED ORDER — LIDOCAINE HCL (CARDIAC) 20 MG/ML IV SOLN
INTRAVENOUS | Status: DC | PRN
  Administered 2014-03-21: 80 mg via INTRAVENOUS

## 2014-03-21 MED ORDER — LIDOCAINE-EPINEPHRINE 0.5 %-1:200000 IJ SOLN
INTRAMUSCULAR | Status: AC
  Filled 2014-03-21: qty 1

## 2014-03-21 MED ORDER — LIDOCAINE HCL (CARDIAC) 20 MG/ML IV SOLN
INTRAVENOUS | Status: AC
  Filled 2014-03-21: qty 5

## 2014-03-21 MED ORDER — PROPOFOL 10 MG/ML IV EMUL
INTRAVENOUS | Status: AC
  Filled 2014-03-21: qty 50

## 2014-03-21 MED ORDER — OXYCODONE-ACETAMINOPHEN 5-325 MG PO TABS: 1.0000 | ORAL_TABLET | Freq: Four times a day (QID) | ORAL | Status: DC | PRN

## 2014-03-21 MED ORDER — MIDAZOLAM HCL 2 MG/2ML IJ SOLN
INTRAMUSCULAR | Status: AC
  Filled 2014-03-21: qty 2

## 2014-03-21 MED ORDER — 0.9 % SODIUM CHLORIDE (POUR BTL) OPTIME
TOPICAL | Status: DC | PRN
  Administered 2014-03-21: 1000 mL

## 2014-03-21 MED ORDER — LIDOCAINE-EPINEPHRINE 0.5 %-1:200000 IJ SOLN
INTRAMUSCULAR | Status: DC | PRN
  Administered 2014-03-21: 50 mL

## 2014-03-21 MED ORDER — PROPOFOL 10 MG/ML IV BOLUS
INTRAVENOUS | Status: AC
  Filled 2014-03-21: qty 20

## 2014-03-21 MED ORDER — DEXTROSE 50 % IV SOLN
25.0000 mL | Freq: Once | INTRAVENOUS | Status: AC
  Administered 2014-03-21: 25 mL via INTRAVENOUS
  Filled 2014-03-21: qty 50

## 2014-03-21 MED ORDER — FENTANYL CITRATE 0.05 MG/ML IJ SOLN: 50.0000 ug | Freq: Once | INTRAMUSCULAR | Status: DC

## 2014-03-21 MED ORDER — SODIUM CHLORIDE 0.9 % IR SOLN
Status: DC | PRN
  Administered 2014-03-21: 12:00:00

## 2014-03-21 MED ORDER — FENTANYL CITRATE 0.05 MG/ML IJ SOLN
INTRAMUSCULAR | Status: DC | PRN
  Administered 2014-03-21: 50 ug via INTRAVENOUS

## 2014-03-21 MED ORDER — PROPOFOL INFUSION 10 MG/ML OPTIME
INTRAVENOUS | Status: DC | PRN
  Administered 2014-03-21: 50 ug/kg/min via INTRAVENOUS

## 2014-03-21 MED ORDER — FENTANYL CITRATE 0.05 MG/ML IJ SOLN
INTRAMUSCULAR | Status: AC
  Filled 2014-03-21: qty 5

## 2014-03-21 SURGICAL SUPPLY — 41 items
APL SKNCLS STERI-STRIP NONHPOA (GAUZE/BANDAGES/DRESSINGS) ×1
ARMBAND PINK RESTRICT EXTREMIT (MISCELLANEOUS) ×3 IMPLANT
BENZOIN TINCTURE PRP APPL 2/3 (GAUZE/BANDAGES/DRESSINGS) ×3 IMPLANT
CANISTER SUCTION 2500CC (MISCELLANEOUS) ×3 IMPLANT
CLIP LIGATING EXTRA MED SLVR (CLIP) ×3 IMPLANT
CLIP LIGATING EXTRA SM BLUE (MISCELLANEOUS) ×3 IMPLANT
CLOSURE WOUND 1/2 X4 (GAUZE/BANDAGES/DRESSINGS) ×1
COVER PROBE W GEL 5X96 (DRAPES) ×3 IMPLANT
COVER SURGICAL LIGHT HANDLE (MISCELLANEOUS) ×3 IMPLANT
DECANTER SPIKE VIAL GLASS SM (MISCELLANEOUS) ×3 IMPLANT
ELECT REM PT RETURN 9FT ADLT (ELECTROSURGICAL) ×3
ELECTRODE REM PT RTRN 9FT ADLT (ELECTROSURGICAL) ×1 IMPLANT
GEL ULTRASOUND 20GR AQUASONIC (MISCELLANEOUS) IMPLANT
GLOVE BIOGEL PI IND STRL 6.5 (GLOVE) IMPLANT
GLOVE BIOGEL PI IND STRL 7.0 (GLOVE) IMPLANT
GLOVE BIOGEL PI INDICATOR 6.5 (GLOVE) ×2
GLOVE BIOGEL PI INDICATOR 7.0 (GLOVE) ×2
GLOVE SKINSENSE NS SZ6.5 (GLOVE) ×2
GLOVE SKINSENSE NS SZ7.0 (GLOVE) ×2
GLOVE SKINSENSE STRL SZ6.5 (GLOVE) IMPLANT
GLOVE SKINSENSE STRL SZ7.0 (GLOVE) IMPLANT
GLOVE SS BIOGEL STRL SZ 7.5 (GLOVE) ×1 IMPLANT
GLOVE SUPERSENSE BIOGEL SZ 7.5 (GLOVE) ×2
GOWN STRL REUS W/ TWL LRG LVL3 (GOWN DISPOSABLE) ×3 IMPLANT
GOWN STRL REUS W/TWL LRG LVL3 (GOWN DISPOSABLE) ×9
KIT BASIN OR (CUSTOM PROCEDURE TRAY) ×3 IMPLANT
KIT ROOM TURNOVER OR (KITS) ×3 IMPLANT
NS IRRIG 1000ML POUR BTL (IV SOLUTION) ×3 IMPLANT
PACK CV ACCESS (CUSTOM PROCEDURE TRAY) ×3 IMPLANT
PAD ARMBOARD 7.5X6 YLW CONV (MISCELLANEOUS) ×6 IMPLANT
SPONGE GAUZE 4X4 12PLY (GAUZE/BANDAGES/DRESSINGS) ×3 IMPLANT
SPONGE GAUZE 4X4 12PLY STER LF (GAUZE/BANDAGES/DRESSINGS) ×2 IMPLANT
STRIP CLOSURE SKIN 1/2X4 (GAUZE/BANDAGES/DRESSINGS) ×2 IMPLANT
SUT PROLENE 6 0 CC (SUTURE) ×3 IMPLANT
SUT VIC AB 3-0 SH 27 (SUTURE) ×3
SUT VIC AB 3-0 SH 27X BRD (SUTURE) ×1 IMPLANT
TAPE CLOTH SURG 4X10 WHT LF (GAUZE/BANDAGES/DRESSINGS) ×2 IMPLANT
TOWEL OR 17X24 6PK STRL BLUE (TOWEL DISPOSABLE) ×3 IMPLANT
TOWEL OR 17X26 10 PK STRL BLUE (TOWEL DISPOSABLE) ×3 IMPLANT
UNDERPAD 30X30 INCONTINENT (UNDERPADS AND DIAPERS) ×3 IMPLANT
WATER STERILE IRR 1000ML POUR (IV SOLUTION) ×3 IMPLANT

## 2014-03-21 NOTE — Progress Notes (Signed)
Reported CBG of 57 to Dr. Leda Quail at 0800 and order obtained.  Pt asymptomatic and had glipizide yesterday.  Reported potassium of 5.8 to Dr. Leda Quail at this time. He states It's okay for him to work with that.

## 2014-03-21 NOTE — Interval H&P Note (Signed)
History and Physical Interval Note:  03/21/2014 11:45 AM  [name]  has presented today for surgery, with the diagnosis of Chronic kidney disease  The various methods of treatment have been discussed with the patient and family. After consideration of risks, benefits and other options for treatment, the patient has consented to  Procedure(s): ARTERIOVENOUS (AV) FISTULA CREATION (Left) as a surgical intervention .  The patient's history has been reviewed, patient examined, no change in status, stable for surgery.  I have reviewed the patient's chart and labs.  Questions were answered to the patient's satisfaction.     Arvilla Meres [place]

## 2014-03-21 NOTE — Anesthesia Preprocedure Evaluation (Signed)
Anesthesia Evaluation  Patient identified by MRN, date of birth, ID band Patient awake    Reviewed: Allergy & Precautions, H&P , NPO status , Patient's Chart, lab work & pertinent test results  Airway Mallampati: II TM Distance: >3 FB Neck ROM: Full    Dental   Pulmonary asthma , former smoker,  breath sounds clear to auscultation        Cardiovascular hypertension, + Peripheral Vascular Disease and +CHF Rhythm:Regular Rate:Normal     Neuro/Psych    GI/Hepatic hiatal hernia,   Endo/Other  diabetesMorbid obesity  Renal/GU ESRFRenal disease     Musculoskeletal   Abdominal (+) + obese,   Peds  Hematology   Anesthesia Other Findings   Reproductive/Obstetrics                           Anesthesia Physical Anesthesia Plan  ASA: III  Anesthesia Plan: MAC   Post-op Pain Management:    Induction: Intravenous  Airway Management Planned: Simple Face Mask  Additional Equipment:   Intra-op Plan:   Post-operative Plan:   Informed Consent: I have reviewed the patients History and Physical, chart, labs and discussed the procedure including the risks, benefits and alternatives for the proposed anesthesia with the patient or authorized representative who has indicated his/her understanding and acceptance.     Plan Discussed with: CRNA and Surgeon  Anesthesia Plan Comments:         Anesthesia Quick Evaluation

## 2014-03-21 NOTE — Transfer of Care (Signed)
Immediate Anesthesia Transfer of Care Note  Patient: [name]  Procedure(s) Performed: Procedure(s): ARTERIOVENOUS (AV) FISTULA CREATION with ultrasound (Left)  Patient Location: PACU  Anesthesia Type:MAC  Level of Consciousness: awake, alert  and oriented  Airway & Oxygen Therapy: Patient Spontanous Breathing and Patient connected to nasal cannula oxygen  Post-op Assessment: Report given to PACU RN, Post -op Vital signs reviewed and stable and Patient moving all extremities  Post vital signs: Reviewed and stable  Complications: No apparent anesthesia complications

## 2014-03-21 NOTE — Telephone Encounter (Addendum)
Message copied by Gena Fray on Fri Mar 21, 2014  2:04 PM ------      Message from: Denman George      Created: Fri Mar 21, 2014  1:44 PM      Regarding: Zigmund Daniel log ; f/u appt in 1 mo w/ TFE                   ----- Message -----         From: [name], MD         Sent: 03/21/2014   1:19 PM           To: [facility] Charge Pool            Had a left Cimino AV fistula. Tourist information centre manager. He does see him in one month for followup ------  03/21/14: no vm- mailed letter, dpm

## 2014-03-21 NOTE — Op Note (Signed)
    OPERATIVE REPORT  DATE OF SURGERY: 03/21/2014  PATIENT: [name], 63 y.o. male MRN: [id number]  DOB: 1951/05/05  PRE-OPERATIVE DIAGNOSIS: Chronic renal insufficiency  POST-OPERATIVE DIAGNOSIS:  Same  PROCEDURE: Left Cimino AV fistula  SURGEON:  Curt Jews, M.D.  PHYSICIAN ASSISTANT: Nurse  ANESTHESIA:  Local with sedation  EBL: Minimal ml  Total I/O In: 350 [I.V.:350] Out: -   BLOOD ADMINISTERED: None  DRAINS: None  SPECIMEN: None  COUNTS CORRECT:  YES  PLAN OF CARE: PACU   PATIENT DISPOSITION:  PACU - hemodynamically stable  PROCEDURE DETAILS: Patient was taken up and placed supine position where the area of the left arm was prepped in the sterile fashion. SonoSite options used to visualize the veins in the arm. Patient had a very nice cephalic vein from the level of the wrist and antecubital space. The cephalic vein drained into the basilic system in the above-knee position a very small cephalic vein in the upper arm. Using local anesthesia incision made to the level of the cephalic vein and radial artery at the wrist. The cephalic vein was ligated distally and divided and was mobilized level the radial artery. The radial artery was occluded proximally and distally was opened with an 11 blade and sent longitudinally with Potts scissors. The vein was cut to appropriate length and spatulated and sewn end-to-side to the artery with a running 6-0 Prolene suture. Clamps removed and excellent curl was noted. Wound irrigated with saline. Hemostasis obtained with cautery. Wounds were closed with 3-0 Vicryl in the subcutaneous and subcuticular tissue.  Steri-Strips were applied.   Curt Jews, M.D. 03/21/2014 1:17 PM

## 2014-03-21 NOTE — Discharge Instructions (Signed)

## 2014-03-21 NOTE — H&P (View-Only) (Signed)
Subjective: Interval History: none. Reports some chest soreness between 3 and 4 AM last night and the night before. I have scanned to notify his family practice physician when they make rounds this morning currently in no distress.   Objective: Vital signs in last 24 hours: Temp:  [97.6 F (36.4 C)-98.1 F (36.7 C)] 97.6 F (36.4 C) (03/31 0445) Pulse Rate:  [52-54] 52 (03/31 0445) Resp:  [18-20] 18 (03/31 0445) BP: (135-153)/(51-79) 150/56 mmHg (03/31 0445) SpO2:  [99 %-100 %] 99 % (03/31 0445) Weight:  [278 lb 3.5 oz (126.2 kg)] 278 lb 3.5 oz (126.2 kg) (03/31 0445)  Intake/Output from previous day: 03/30 0701 - 03/31 0700 In: 603 [P.O.:600; I.V.:3] Out: 2975 [Urine:2975] Intake/Output this shift: Total I/O In: -  Out: 2075 [Urine:2075]  2+ radial pulses bilaterally. Small surface veins  Lab Results:  Recent Labs  03/09/14 0349 03/11/14 0550  WBC 9.6 10.7*  HGB 8.1* 8.4*  HCT 24.8* 25.6*  PLT 223 209   BMET  Recent Labs  03/09/14 0319 03/10/14 0509  NA 138 139  K 4.4 4.6  CL 101 100  CO2 21 23  GLUCOSE 166* 142*  BUN 72* 80*  CREATININE 6.57* 6.88*  CALCIUM 8.1* 8.3*    Studies/Results: Dg Chest 2 View  03/07/2014   CLINICAL DATA:  Short of breath  EXAM: CHEST  2 VIEW  COMPARISON:  08/19/2013  FINDINGS: Heart size and vascularity are normal.  Mild left lower lobe airspace disease and small left effusion. This may represent atelectasis or possibly pneumonia.  IMPRESSION: Mild left lower lobe atelectasis/ infiltrate and small left effusion.   Electronically Signed   By: Franchot Gallo M.D.   On: 03/07/2014 11:46   US Renal  03/08/2014   CLINICAL DATA:  Acute kidney injury.  Chronic kidney disease.  EXAM: RENAL/URINARY TRACT ULTRASOUND COMPLETE  COMPARISON:  CT, 06/04/2009  FINDINGS: Right Kidney:  Length: 11.2 cm. Increased parenchymal echogenicity. 2 cm simple cyst arises from the lower pole. No other renal masses. No stones. No hydronephrosis.  Left Kidney:   Length: 11.0 cm. Borderline increased parenchymal echogenicity. 19 mm lower pole cyst. No other renal masses. No stones. No hydronephrosis.  Bladder:  Appears normal for degree of bladder distention.  IMPRESSION: 1. Increased renal parenchymal echogenicity consistent with medical renal disease. 2. Single cyst arises from each kidney, from the lower poles. 3. No hydronephrosis.   Electronically Signed   By: Lajean Manes M.D.   On: 03/08/2014 15:40   Anti-infectives: Anti-infectives   Start     Dose/Rate Route Frequency Ordered Stop   03/07/14 1300  levofloxacin (LEVAQUIN) IVPB 750 mg     750 mg 100 mL/hr over 90 Minutes Intravenous  Once 03/07/14 1257 03/07/14 1449      Assessment/Plan: s/p * No surgery found * Again discussed the need for hemodialysis access with the patient. Discuss results of his vein from yesterday. He is right-handed. He has marginal veins in the cephalic and basilic system on the left arm at the antecubital space. Would recommend fistula attempt with a decision between upper arm cephalic vein fistula versus first stage of basilic vein transposition made at the time of surgery. He is still considering his options is not willing to plan surgery. will follow from side lines.   LOS: 4 days   Mong Neal 03/11/2014, 6:27 AM

## 2014-03-21 NOTE — Anesthesia Postprocedure Evaluation (Signed)
  Anesthesia Post-op Note  Patient: [name]  Procedure(s) Performed: Procedure(s): ARTERIOVENOUS (AV) FISTULA CREATION with ultrasound (Left)  Patient Location: PACU  Anesthesia Type:MAC  Level of Consciousness: awake  Airway and Oxygen Therapy: Patient Spontanous Breathing  Post-op Pain: mild  Post-op Assessment: Post-op Vital signs reviewed, Patient's Cardiovascular Status Stable, Respiratory Function Stable, Patent Airway, No signs of Nausea or vomiting and Pain level controlled  Post-op Vital Signs: Reviewed and stable  Last Vitals:  Filed Vitals:   03/21/14 1318  BP: 150/77  Pulse:   Temp: 36.5 C  Resp: 15    Complications: No apparent anesthesia complications

## 2014-03-24 ENCOUNTER — Ambulatory Visit (INDEPENDENT_AMBULATORY_CARE_PROVIDER_SITE_OTHER): Payer: Medicare Other | Admitting: Family Medicine

## 2014-03-24 ENCOUNTER — Encounter: Payer: Self-pay | Admitting: Family Medicine

## 2014-03-24 ENCOUNTER — Other Ambulatory Visit: Payer: Self-pay | Admitting: *Deleted

## 2014-03-24 ENCOUNTER — Other Ambulatory Visit: Payer: Self-pay | Admitting: Student-PharmD

## 2014-03-24 VITALS — BP 138/53 | HR 51 | Temp 98.7°F | Wt 272.0 lb

## 2014-03-24 DIAGNOSIS — Z4931 Encounter for adequacy testing for hemodialysis: Secondary | ICD-10-CM

## 2014-03-24 DIAGNOSIS — I10 Essential (primary) hypertension: Secondary | ICD-10-CM

## 2014-03-24 DIAGNOSIS — I872 Venous insufficiency (chronic) (peripheral): Secondary | ICD-10-CM

## 2014-03-24 DIAGNOSIS — N186 End stage renal disease: Secondary | ICD-10-CM

## 2014-03-24 DIAGNOSIS — N183 Chronic kidney disease, stage 3 unspecified: Secondary | ICD-10-CM

## 2014-03-24 DIAGNOSIS — I5030 Unspecified diastolic (congestive) heart failure: Secondary | ICD-10-CM

## 2014-03-24 DIAGNOSIS — I8310 Varicose veins of unspecified lower extremity with inflammation: Secondary | ICD-10-CM

## 2014-03-24 DIAGNOSIS — I509 Heart failure, unspecified: Secondary | ICD-10-CM

## 2014-03-24 LAB — GLUCOSE, CAPILLARY
Glucose-Capillary: 109 mg/dL — ABNORMAL HIGH (ref 70–99)
Glucose-Capillary: 62 mg/dL — ABNORMAL LOW (ref 70–99)

## 2014-03-24 NOTE — Assessment & Plan Note (Signed)
Wt loss primarily fluid.

## 2014-03-24 NOTE — Assessment & Plan Note (Signed)
Wt down 30lbs in less than 1 month from previous high Continues on Lasix 80mg  BID This may be able to be reduced and only used PRN due to renal impairment.  Will defer to renal given recent change in status.

## 2014-03-24 NOTE — Assessment & Plan Note (Signed)
At goal.  No change 

## 2014-03-24 NOTE — Patient Instructions (Signed)
You are doing great overall I love your determination Please follow up with the renal doctors and discuss decreasing your lasix dose Please come back to see me in 8-12 weeks for your next check up or sooner if needed Please call if you develop further swelling and leg sores and I'll make some calls to get you into the lymphedema clinic as soon as possible.  Have a great day.

## 2014-03-24 NOTE — Progress Notes (Signed)
Patrick LAFON is a 63 y.o. male who presents to [facility] today for follow up  SInce alst clinic appt has had fistula placed on Friday. Non painful. Thrill present. Bandaging still in place. GOing back to see renal on 03/26/14.   SOB resolved. Wt continues to drop.   Leg swelling improving. Lasix 80mg  BID. Making urine.   Leg wounds. Resolving. On bactrim and w/ compression stockings. Tried to get into lymphedema clinic previously but was told teh wait was too long.    The following portions of the patient's history were reviewed and updated as appropriate: allergies, current medications, past medical history, family and social history, and problem list.  Patient is a nonsmoker.  Past Medical History  Diagnosis Date  . Diabetes mellitus   . Hypertension   . Hyperlipidemia   . Chronic kidney disease   . PVD (peripheral vascular disease)   . Venous insufficiency   . Morbid obesity   . Chronic cystitis   . Arthritis   . UMBILICAL HERNIA [id number]    Qualifier: History of  By: [name] MD, Audelia Acton    . NEPHROLITHIASIS, HX OF 12/02/2009    Qualifier: Diagnosis of  By: [name], Cat    . Lymphedema   . CHF (congestive heart failure)   . Asthma     as a child  . H/O hiatal hernia     states it's been fixed  . Dry skin     ROS as above otherwise neg.    Medications reviewed. Current Outpatient Prescriptions  Medication Sig Dispense Refill  . amLODipine (NORVASC) 10 MG tablet take 1 tablet by mouth once daily  90 tablet  3  . aspirin 81 MG tablet Take 81 mg by mouth daily.        [name] atorvastatin (LIPITOR) 40 MG tablet Take 1 tablet (40 mg total) by mouth daily at 6 PM.  30 tablet  11  . calcium acetate (PHOSLO) 667 MG capsule Take 2 capsules (1,334 mg total) by mouth 3 (three) times daily with meals.  160 capsule  11  . carvedilol (COREG) 25 MG tablet Take 1 tablet (25 mg total) by mouth 2 (two) times daily with a meal.  60 tablet  11  . furosemide (LASIX) 80 MG tablet Take 80 mg by mouth 2  (two) times daily.      [name] glipiZIDE (GLUCOTROL) 10 MG tablet Take 10 mg by mouth daily before breakfast. Hold for sugar <98      . hydrALAZINE (APRESOLINE) 100 MG tablet Take 1 tablet (100 mg total) by mouth 2 (two) times daily. take 1 tablet by mouth twice a day  180 tablet  3  . oxyCODONE-acetaminophen (PERCOCET/ROXICET) 5-325 MG per tablet Take 1 tablet by mouth every 6 (six) hours as needed.  30 tablet  0  . sulfamethoxazole-trimethoprim (BACTRIM DS) 800-160 MG per tablet Take 1 tablet by mouth daily.        No current facility-administered medications for this visit.    Exam:  BP 138/53  Pulse 51  Temp(Src) 98.7 F (37.1 C) (Oral)  Wt 272 lb (123.378 kg)  SpO2 100% Gen: Well NAD HEENT: EOMI,  MMM Lungs: CTABL Nl WOB Heart: RRR no MRG Abd: NABS, NT, ND Exts: Non edematous BL  LE, warm and well perfused.   Results for orders placed during the hospital encounter of 03/21/14 (from the past 72 hour(s))  GLUCOSE, CAPILLARY     Status: None   Collection Time  03/21/14 11:22 AM      Result Value Ref Range   Glucose-Capillary 76  70 - 99 mg/dL  GLUCOSE, CAPILLARY     Status: None   Collection Time    03/21/14 12:29 PM      Result Value Ref Range   Glucose-Capillary 86  70 - 99 mg/dL  GLUCOSE, CAPILLARY     Status: None   Collection Time    03/21/14  1:23 PM      Result Value Ref Range   Glucose-Capillary 71  70 - 99 mg/dL   Comment 1 Documented in Chart     Comment 2 Notify RN    GLUCOSE, CAPILLARY     Status: Abnormal   Collection Time    03/21/14  2:42 PM      Result Value Ref Range   Glucose-Capillary 66 (*) 70 - 99 mg/dL  GLUCOSE, CAPILLARY     Status: None   Collection Time    03/21/14  3:02 PM      Result Value Ref Range   Glucose-Capillary 99  70 - 99 mg/dL   Comment 1 Documented in Chart     Comment 2 Notify RN      A/P (as seen in Problem list)  Diastolic CHF Wt down [id number] in less than 1 month from previous high Continues on Lasix 80mg  BID This may  be able to be reduced and only used PRN due to renal impairment.  Will defer to renal given recent change in status.   Stasis dermatitis Much improved given reduced LE edema and bactrim therapy Likely a chronic condition for pt given DM, edema, CHF. Will refer to lymphedema clinic if worsens again after current therapy  CHRONIC KIDNEY DISEASE STAGE III (MODERATE) Fistula patent. Bandaging removed and incision site c/d/i w/ steri strips in place.  F/u renal in 2 days Pt moving towards dialysis but still w/ some renal fxn Pt to discuss DC or decrease dose of lasix w/ renal BP at goal  OBESITY, MORBID Wt loss primarily fluid.  HYPERTENSION, BENIGN SYSTEMIC At goal No change

## 2014-03-24 NOTE — Assessment & Plan Note (Addendum)
Fistula patent. Bandaging removed and incision site c/d/i w/ steri strips in place.  F/u renal in 2 days Pt moving towards dialysis but still w/ some renal fxn Pt to discuss DC or decrease dose of lasix w/ renal BP at goal

## 2014-03-24 NOTE — Assessment & Plan Note (Signed)
Much improved given reduced LE edema and bactrim therapy Likely a chronic condition for pt given DM, edema, CHF. Will refer to lymphedema clinic if worsens again after current therapy

## 2014-03-25 ENCOUNTER — Ambulatory Visit: Payer: Medicare Other | Admitting: Home Health Services

## 2014-04-14 ENCOUNTER — Encounter: Payer: Self-pay | Admitting: Vascular Surgery

## 2014-04-15 ENCOUNTER — Inpatient Hospital Stay (HOSPITAL_COMMUNITY): Admit: 2014-04-15 | Payer: Medicare Other

## 2014-04-15 ENCOUNTER — Encounter: Payer: Medicare Other | Admitting: Vascular Surgery

## 2014-04-21 ENCOUNTER — Ambulatory Visit (HOSPITAL_COMMUNITY)
Admission: RE | Admit: 2014-04-21 | Discharge: 2014-04-21 | Disposition: A | Discharge status: Home / Self Care | Payer: Medicare Other | Source: Ambulatory Visit | Attending: Vascular Surgery | Admitting: Vascular Surgery

## 2014-04-21 ENCOUNTER — Other Ambulatory Visit (HOSPITAL_COMMUNITY): Payer: Self-pay

## 2014-04-21 DIAGNOSIS — N186 End stage renal disease: Secondary | ICD-10-CM | POA: Insufficient documentation

## 2014-04-21 DIAGNOSIS — Z4931 Encounter for adequacy testing for hemodialysis: Secondary | ICD-10-CM | POA: Insufficient documentation

## 2014-04-22 ENCOUNTER — Telehealth: Payer: Self-pay | Admitting: Internal Medicine

## 2014-04-22 ENCOUNTER — Encounter (HOSPITAL_COMMUNITY)
Admission: RE | Admit: 2014-04-22 | Discharge: 2014-04-22 | Disposition: A | Discharge status: Home / Self Care | Payer: Medicare Other | Source: Ambulatory Visit | Attending: Nephrology | Admitting: Nephrology

## 2014-04-22 VITALS — BP 156/63 | HR 51 | Temp 98.2°F | Resp 20

## 2014-04-22 DIAGNOSIS — D638 Anemia in other chronic diseases classified elsewhere: Secondary | ICD-10-CM | POA: Diagnosis not present

## 2014-04-22 DIAGNOSIS — N185 Chronic kidney disease, stage 5: Secondary | ICD-10-CM | POA: Diagnosis not present

## 2014-04-22 DIAGNOSIS — N183 Chronic kidney disease, stage 3 unspecified: Secondary | ICD-10-CM

## 2014-04-22 LAB — POCT HEMOGLOBIN-HEMACUE: HEMOGLOBIN: 8.7 g/dL — AB (ref 13.0–17.0)

## 2014-04-22 MED ORDER — DARBEPOETIN ALFA-POLYSORBATE 100 MCG/0.5ML IJ SOLN
INTRAMUSCULAR | Status: AC
  Filled 2014-04-22: qty 0.5

## 2014-04-22 MED ORDER — DARBEPOETIN ALFA-POLYSORBATE 100 MCG/0.5ML IJ SOLN
100.0000 ug | INTRAMUSCULAR | Status: DC
  Administered 2014-04-22: 100 ug via SUBCUTANEOUS

## 2014-04-22 NOTE — Telephone Encounter (Signed)
S/W PATIENT AND GAVE NP APPT FOR 05/22 @ 1:30 W/DR. CHISM. REFERRING DR. Jamal Maes DX- MONOCLONAL GAMMOPATHY

## 2014-04-22 NOTE — Telephone Encounter (Signed)
C/D 04/22/14 for appt. 05/02/14

## 2014-04-22 NOTE — Discharge Instructions (Signed)
Darbepoetin Alfa injection What is this medicine? DARBEPOETIN ALFA (dar be POE e tin AL fa) helps your body make more red blood cells. It is used to treat anemia caused by chronic kidney failure and chemotherapy. This medicine may be used for other purposes; ask your health care provider or pharmacist if you have questions. COMMON BRAND NAME(S): Aranesp What should I tell my health care provider before I take this medicine? They need to know if you have any of these conditions: -blood clotting disorders or history of blood clots -cancer patient not on chemotherapy -cystic fibrosis -heart disease, such as angina, heart failure, or a history of a heart attack -hemoglobin level of 12 g/dL or greater -high blood pressure -low levels of folate, iron, or vitamin B12 -seizures -an unusual or allergic reaction to darbepoetin, erythropoietin, albumin, hamster proteins, latex, other medicines, foods, dyes, or preservatives -pregnant or trying to get pregnant -breast-feeding How should I use this medicine? This medicine is for injection into a vein or under the skin. It is usually given by a health care professional in a hospital or clinic setting. If you get this medicine at home, you will be taught how to prepare and give this medicine. Do not shake the solution before you withdraw a dose. Use exactly as directed. Take your medicine at regular intervals. Do not take your medicine more often than directed. It is important that you put your used needles and syringes in a special sharps container. Do not put them in a trash can. If you do not have a sharps container, call your pharmacist or healthcare provider to get one. Talk to your pediatrician regarding the use of this medicine in children. While this medicine may be used in children as young as 1 year for selected conditions, precautions do apply. Overdosage: If you think you have taken too much of this medicine contact a poison control center or  emergency room at once. NOTE: This medicine is only for you. Do not share this medicine with others. What if I miss a dose? If you miss a dose, take it as soon as you can. If it is almost time for your next dose, take only that dose. Do not take double or extra doses. What may interact with this medicine? Do not take this medicine with any of the following medications: -epoetin alfa This list may not describe all possible interactions. Give your health care provider a list of all the medicines, herbs, non-prescription drugs, or dietary supplements you use. Also tell them if you smoke, drink alcohol, or use illegal drugs. Some items may interact with your medicine. What should I watch for while using this medicine? Visit your prescriber or health care professional for regular checks on your progress and for the needed blood tests and blood pressure measurements. It is especially important for the doctor to make sure your hemoglobin level is in the desired range, to limit the risk of potential side effects and to give you the best benefit. Keep all appointments for any recommended tests. Check your blood pressure as directed. Ask your doctor what your blood pressure should be and when you should contact him or her. As your body makes more red blood cells, you may need to take iron, folic acid, or vitamin B supplements. Ask your doctor or health care provider which products are right for you. If you have kidney disease continue dietary restrictions, even though this medication can make you feel better. Talk with your doctor or health   care professional about the foods you eat and the vitamins that you take. What side effects may I notice from receiving this medicine? Side effects that you should report to your doctor or health care professional as soon as possible: -allergic reactions like skin rash, itching or hives, swelling of the face, lips, or tongue -breathing problems -changes in vision -chest  pain -confusion, trouble speaking or understanding -feeling faint or lightheaded, falls -high blood pressure -muscle aches or pains -pain, swelling, warmth in the leg -rapid weight gain -severe headaches -sudden numbness or weakness of the face, arm or leg -trouble walking, dizziness, loss of balance or coordination -seizures (convulsions) -swelling of the ankles, feet, hands -unusually weak or tired Side effects that usually do not require medical attention (report to your doctor or health care professional if they continue or are bothersome): -diarrhea -fever, chills (flu-like symptoms) -headaches -nausea, vomiting -redness, stinging, or swelling at site where injected This list may not describe all possible side effects. Call your doctor for medical advice about side effects. You may report side effects to FDA at 1-800-FDA-1088. Where should I keep my medicine? Keep out of the reach of children. Store in a refrigerator between 2 and 8 degrees C (36 and 46 degrees F). Do not freeze. Do not shake. Throw away any unused portion if using a single-dose vial. Throw away any unused medicine after the expiration date. NOTE: This sheet is a summary. It may not cover all possible information. If you have questions about this medicine, talk to your doctor, pharmacist, or health care provider.  2014, [employer]vier/[employer] Standard. (2008-11-11 10:23:57)  

## 2014-04-28 ENCOUNTER — Encounter: Payer: Self-pay | Admitting: Vascular Surgery

## 2014-04-29 ENCOUNTER — Ambulatory Visit (INDEPENDENT_AMBULATORY_CARE_PROVIDER_SITE_OTHER): Payer: Self-pay | Admitting: Vascular Surgery

## 2014-04-29 ENCOUNTER — Encounter: Payer: Self-pay | Admitting: Vascular Surgery

## 2014-04-29 VITALS — BP 161/54 | HR 54 | Resp 16 | Ht 71.0 in | Wt 286.0 lb

## 2014-04-29 DIAGNOSIS — N186 End stage renal disease: Secondary | ICD-10-CM

## 2014-04-29 NOTE — Progress Notes (Signed)
Patient has today for followup of his left Cimino AV fistula creation by myself on 03/21/2014. He has had a very nice healing of his incision.  Physical exam he does have excellent thrill and excellent Dakari Stabler maturation of his Cimino AV fistula. He has good size maturation although weight to the antecubital space. On physical exam there are several tributary side branches but does not appear to be limiting flow through his cephalic vein.  I reviewed his duplex of his fistula from 04/21/2014 discuss this with the patient. This does show good size maturation.  Impression and plan: A good Ivanell Deshotel development of left Cimino AV fistula. He reports that his renal function is currently stable. Hopefully he will not require use of this but I do feel that he has an excellent chance of having a successful fistula if he does progressed to end-stage renal disease. He'll notify should he develop any difficulty or concerns. Otherwise we'll see him an as-needed basis

## 2014-05-02 ENCOUNTER — Other Ambulatory Visit: Payer: Medicare Other

## 2014-05-02 ENCOUNTER — Ambulatory Visit: Payer: Medicare Other

## 2014-05-02 ENCOUNTER — Other Ambulatory Visit: Payer: Self-pay | Admitting: Internal Medicine

## 2014-05-02 DIAGNOSIS — D472 Monoclonal gammopathy: Secondary | ICD-10-CM

## 2014-05-06 ENCOUNTER — Inpatient Hospital Stay (HOSPITAL_COMMUNITY): Admission: RE | Admit: 2014-05-06 | Payer: Medicare Other | Source: Ambulatory Visit

## 2014-05-07 ENCOUNTER — Telehealth: Payer: Self-pay | Admitting: Internal Medicine

## 2014-05-07 NOTE — Telephone Encounter (Signed)
S/W PATIENT AND R/S NP APPT FOR 06/18 @ 1:30 W/DR. CHISM.

## 2014-05-29 ENCOUNTER — Ambulatory Visit (HOSPITAL_BASED_OUTPATIENT_CLINIC_OR_DEPARTMENT_OTHER): Payer: Medicare Other | Admitting: Internal Medicine

## 2014-05-29 ENCOUNTER — Other Ambulatory Visit (HOSPITAL_BASED_OUTPATIENT_CLINIC_OR_DEPARTMENT_OTHER): Payer: Medicare Other

## 2014-05-29 ENCOUNTER — Encounter: Payer: Self-pay | Admitting: Internal Medicine

## 2014-05-29 ENCOUNTER — Telehealth: Payer: Self-pay | Admitting: Internal Medicine

## 2014-05-29 ENCOUNTER — Ambulatory Visit: Payer: Medicare Other

## 2014-05-29 ENCOUNTER — Telehealth: Payer: Self-pay | Admitting: Family Medicine

## 2014-05-29 ENCOUNTER — Encounter (INDEPENDENT_AMBULATORY_CARE_PROVIDER_SITE_OTHER): Payer: Self-pay

## 2014-05-29 VITALS — BP 183/62 | HR 55 | Temp 98.4°F | Resp 18 | Ht 71.0 in | Wt 285.2 lb

## 2014-05-29 DIAGNOSIS — N039 Chronic nephritic syndrome with unspecified morphologic changes: Secondary | ICD-10-CM

## 2014-05-29 DIAGNOSIS — N189 Chronic kidney disease, unspecified: Secondary | ICD-10-CM

## 2014-05-29 DIAGNOSIS — E669 Obesity, unspecified: Secondary | ICD-10-CM

## 2014-05-29 DIAGNOSIS — D472 Monoclonal gammopathy: Secondary | ICD-10-CM

## 2014-05-29 DIAGNOSIS — I120 Hypertensive chronic kidney disease with stage 5 chronic kidney disease or end stage renal disease: Secondary | ICD-10-CM

## 2014-05-29 DIAGNOSIS — E875 Hyperkalemia: Secondary | ICD-10-CM

## 2014-05-29 DIAGNOSIS — E119 Type 2 diabetes mellitus without complications: Secondary | ICD-10-CM

## 2014-05-29 DIAGNOSIS — N186 End stage renal disease: Secondary | ICD-10-CM

## 2014-05-29 DIAGNOSIS — D631 Anemia in chronic kidney disease: Secondary | ICD-10-CM

## 2014-05-29 DIAGNOSIS — I509 Heart failure, unspecified: Secondary | ICD-10-CM

## 2014-05-29 DIAGNOSIS — D696 Thrombocytopenia, unspecified: Secondary | ICD-10-CM

## 2014-05-29 LAB — CBC WITH DIFFERENTIAL/PLATELET
BASO%: 0.2 % (ref 0.0–2.0)
Basophils Absolute: 0 10*3/uL (ref 0.0–0.1)
EOS%: 5.3 % (ref 0.0–7.0)
Eosinophils Absolute: 0.5 10*3/uL (ref 0.0–0.5)
HCT: 26.5 % — ABNORMAL LOW (ref 38.4–49.9)
HGB: 8.8 g/dL — ABNORMAL LOW (ref 13.0–17.1)
LYMPH%: 16.5 % (ref 14.0–49.0)
MCH: 27.8 pg (ref 27.2–33.4)
MCHC: 33.2 g/dL (ref 32.0–36.0)
MCV: 83.6 fL (ref 79.3–98.0)
MONO#: 0.5 10*3/uL (ref 0.1–0.9)
MONO%: 5.4 % (ref 0.0–14.0)
NEUT#: 7.2 10*3/uL — ABNORMAL HIGH (ref 1.5–6.5)
NEUT%: 72.6 % (ref 39.0–75.0)
NRBC: 0 % (ref 0–0)
Platelets: 122 10*3/uL — ABNORMAL LOW (ref 140–400)
RBC: 3.17 10*6/uL — AB (ref 4.20–5.82)
RDW: 15.3 % — ABNORMAL HIGH (ref 11.0–14.6)
WBC: 10 10*3/uL (ref 4.0–10.3)
lymph#: 1.6 10*3/uL (ref 0.9–3.3)

## 2014-05-29 LAB — COMPREHENSIVE METABOLIC PANEL (CC13)
ALBUMIN: 3.2 g/dL — AB (ref 3.5–5.0)
ALT: 12 U/L (ref 0–55)
AST: 9 U/L (ref 5–34)
Alkaline Phosphatase: 64 U/L (ref 40–150)
Anion Gap: 8 mEq/L (ref 3–11)
BUN: 97.4 mg/dL — ABNORMAL HIGH (ref 7.0–26.0)
CHLORIDE: 111 meq/L — AB (ref 98–109)
CO2: 21 mEq/L — ABNORMAL LOW (ref 22–29)
CREATININE: 6.6 mg/dL — AB (ref 0.7–1.3)
Calcium: 8.4 mg/dL (ref 8.4–10.4)
Glucose: 141 mg/dl — ABNORMAL HIGH (ref 70–140)
POTASSIUM: 5.3 meq/L — AB (ref 3.5–5.1)
Sodium: 140 mEq/L (ref 136–145)
Total Bilirubin: 0.33 mg/dL (ref 0.20–1.20)
Total Protein: 7.5 g/dL (ref 6.4–8.3)

## 2014-05-29 MED ORDER — SODIUM POLYSTYRENE SULFONATE 15 GM/60ML PO SUSP: 15.0000 g | Freq: Once | ORAL | Status: DC

## 2014-05-29 NOTE — Progress Notes (Signed)
Checked in new pt with no financial concerns. °

## 2014-05-29 NOTE — Patient Instructions (Signed)
Uremia Uremia is when the amount of urea in your blood is higher than usual. Urea is a waste product that is normally removed from the body in your urine. Normally, your kidneys filter your blood and make urine. The kidneys also produce chemical messengers (hormones). These hormones help to maintain the balance of other chemicals in your blood (electrolytes), such as potassium and sodium. Uremia develops when your kidneys have lost most of their ability to filter your blood and work well. This is called renal failure. When renal failure occurs, urea and other waste products build up in your blood, and other hormone and electrolyte levels become unbalanced. Renal failure can happen suddenly (acute renal failure) or over a long period of time (chronic renal failure). Both can cause uremia, but chronic renal failure does so more often.  CAUSES  Diabetes is the most common cause of renal failure and uremia. The second most common cause is high blood pressure. Other causes include:  Infection.  Trauma.  Tumor.  Inherited kidney diseases.  Medicines, including nonsteroidal anti-inflammatory drugs (NSAIDs) and some antibiotics.  Abnormalities of the body's defense system (autoimmune diseases).  Decreased blood supply to the kidney. RISK FACTORS The biggest risk factor is having poorly controlled diabetes or high blood pressure. Other risk factors include:  Being [place] American or Native American.  Being male.  Being older than 64. SIGNS AND SYMPTOMS Uremia can cause lots of symptoms because your kidneys play a role in so many body functions. The most common symptoms of uremia include:  Feeling tired and having no energy.  Having no appetite.  Being thirstier than usual.  Nausea or vomiting.  Muscle aches.  Itchy skin.  Headache.  Restless legs.  Visual changes.  Depression.  Trouble sleeping. DIAGNOSIS  Your health care provider may diagnose uremia based on your  symptoms and a physical exam. You will also have tests to confirm the diagnosis of uremia and to show how well your kidneys are filtering your blood. These tests may include:  Blood and urine tests to check your creatinine level. Creatinine is a waste product that your kidneys normally filter out of your blood.  You may have uremia if there is not enough creatinine in your urine and too much creatinine in your blood.  Your health care provider can estimate how well your kidneys are working from the amount of creatinine in your blood.  Urine tests to check your urine for proteins, blood particles, and other substances that would normally not pass through your kidneys.  An imaging test using sound waves and a computer (ultrasound) to create a picture of your kidney.  A kidney biopsy. Your health care provider uses a needle to get a small piece of kidney tissue to check under a microscope. This is the best way to tell the difference between acute and chronic renal failure.  Other kinds of blood tests and imaging tests may also be done. TREATMENT  Treatment usually involves some type of kidney replacement therapy. The two options are to get blood-filtering treatments (dialysis) or to have a kidney transplant. Other treatments may include:   Having certain glands in your neck (parathyroid glands) removed. These can contribute to itching in uremia.  Taking a type of medicine called an erythropoiesis-stimulating agent (ESA) to prevent the loss of red blood cells (anemia) that is often caused by uremia.  Taking medicine to correct your levels of phosphorus and calcium. Uremia can cause these to build up in your blood.  You may have other treatments depending on the type of renal failure you have and the problems you develop. HOME CARE INSTRUCTIONS  After you have been diagnosed with uremia, diet and nutrition become an important part of home care, no matter what other treatments you may need.  Follow all your health care provider's instructions.  Only take medicines as directed by your health care provider.  Do not smoke.  Limit the amount of alcohol you drink.  Work with a renal dietitian to make sure you are on the best possible diet.   Eat a diet low in salt and fat.  Exercise on most days of the week.  Frequent short walks may be a good option.  Ask your health care provider what exercise is safe for you.  Keep all follow-up appointments. SEEK MEDICAL CARE IF:  You have chills or a fever.  You are struggling to control your symptoms. SEEK IMMEDIATE MEDICAL CARE IF:  You have a fever or persistent symptoms for more than 2-3 days.  You develop any new and troubling symptoms.  You have vomiting that does not go away.  You see blood in your vomit.  You have trouble breathing.  You have chest pain. Document Released: 12/03/2013 Document Reviewed: 10/25/2013 [facility] Patient Information 2015 [place], [place]. This information is not intended to replace advice given to you by your health care provider. Make sure you discuss any questions you have with your health care provider.

## 2014-05-29 NOTE — Telephone Encounter (Signed)
Gave pt appt for lab and MD , sent pt to labs today Bone survey @ [facility] per pt rqst

## 2014-05-29 NOTE — Progress Notes (Signed)
[place] [phone]   [phone]  NEW PATIENT EVALUATION   Name: [name] Date: 05/30/2014 MRN: [id number] DOB: [birth year]  PCP: Linna Darner, MD   REFERRING PHYSICIAN: Waldemar Dickens, MD  REASON FOR REFERRAL: MGUS      HISTORY OF PRESENT ILLNESS:[name] is a 63 y.o. male with an extensive medical history including CKDz s/p AV Fistula (not currently on dialysis) CHF, peripheral vascular disease and DM2 on oral anti glycemic medications who is being referred by Dr. Marily Memos for his history of MGUS.  He receives aranesp every 2 weeks secondary to anemia of chronic kidney disease. He had an arteriovenous fistula placed by Dr. Sherren Mocha Early on 03/21/2014.  He is compliant to lasix 80 mg bid.   Previously on 10/16/2008, the patient was seen by Dr. Jamse Arn.  He was being evaluated for chronic kidney disease and part of his work up essentially noted that he had a positive SPEP.  His M-protein was elevated at 0.4 g/dL with an IgG monoclonal gammopathy.  His IgG and IgA immunoglobulins were elevated at 1,895 and 563 respectively; His IgM was 139.  The patient at that time denied anorexia, weight loss and bone pain.  He received a renal ultrasound on 05/18/2007 that was unremarkable.  The patient had a bone marrow biopsy and a skeletal survey and urine studies.    Upon repeat visit with Dr. Jamse Arn in 11/21/2008, his SPEP showed a restricted band displaying a monoclonal protein measuring 0.52 g/dL.  IFE showed a monoclonal IgG kappa protein; urine IFE showed bence jones proteinuria; IgG elevated at [name], IgA elevated at 554, IgM normal at 135.  A skeletal survey on 10/21/2008 showed no findings that suggested multiple myeloma.  There was a small single lytic focus in the C5 vertebral body, which was felt to represent a nonspecific finding a that time.  The patient had a bone marrow biopsy with aspiration, which essentially showed a normocellular marrow  with trilineage hematopoiesis with 5% plasma cells. FISH for cytogenetics was essentially normal.    Dr. Jamse Arn felt that his lab data indicated very early stage of smoldering multiple myeloma.  He was told that his risk of converting to full blown myeloma was increased and he required close follow up but did not require treatment at that time.  He was then lost to follow up.   PAST MEDICAL HISTORY:  has a past medical history of Diabetes mellitus; Hypertension; Hyperlipidemia; Chronic kidney disease; PVD (peripheral vascular disease); Venous insufficiency; Morbid obesity; Chronic cystitis; Arthritis; UMBILICAL HERNIA (0/30/0923); NEPHROLITHIASIS, HX OF (12/02/2009); Lymphedema; CHF (congestive heart failure); Asthma; H/O hiatal hernia; and Dry skin.     PAST SURGICAL HISTORY: Past Surgical History  Procedure Laterality Date  . R knee arthoscopic repair of meniscus    . Umbilical hernia repair    . Amputation      Right and left fifth toes.   . Popliteal to posterior tibial bypass      2006  . Eye surgery Bilateral     cataract and lens implant  . Colonoscopy    . Hernia repair    . Av fistula placement Left 03/21/2014    Procedure: ARTERIOVENOUS (AV) FISTULA CREATION with ultrasound;  Surgeon: [name], MD;  Location: [facility];  Service: Vascular;  Laterality: Left;     CURRENT MEDICATIONS: has a current medication list which includes the following prescription(s): amlodipine, aspirin, atorvastatin, calcitriol, carvedilol, furosemide, glipizide, hydralazine, sodium bicarbonate,  and sodium polystyrene.   ALLERGIES: Shrimp and Iodine   SOCIAL HISTORY:  reports that he quit smoking about 35 years ago. He has quit using smokeless tobacco. He reports that he does not drink alcohol or use illicit drugs.   FAMILY HISTORY: family history includes Diabetes in his brother and mother; Heart disease in his mother.   LABORATORY DATA:  Results for orders placed in visit on 05/29/14 (from the  past 48 hour(s))  CBC WITH DIFFERENTIAL     Status: Abnormal   Collection Time    05/29/14  1:23 PM      Result Value Ref Range   WBC 10.0  4.0 - 10.3 10e3/uL   NEUT# 7.2 (*) 1.5 - 6.5 10e3/uL   HGB 8.8 Repeated and Verified (*) 13.0 - 17.1 g/dL   HCT 26.5 (*) 38.4 - 49.9 %   Platelets 122 repeated and verified (*) 140 - 400 10e3/uL   MCV 83.6  79.3 - 98.0 fL   MCH 27.8  27.2 - 33.4 pg   MCHC 33.2  32.0 - 36.0 g/dL   RBC 3.17 (*) 4.20 - 5.82 10e6/uL   RDW 15.3 (*) 11.0 - 14.6 %   lymph# 1.6  0.9 - 3.3 10e3/uL   MONO# 0.5  0.1 - 0.9 10e3/uL   Eosinophils Absolute 0.5  0.0 - 0.5 10e3/uL   Basophils Absolute 0.0  0.0 - 0.1 10e3/uL   NEUT% 72.6  39.0 - 75.0 %   LYMPH% 16.5  14.0 - 49.0 %   MONO% 5.4  0.0 - 14.0 %   EOS% 5.3  0.0 - 7.0 %   BASO% 0.2  0.0 - 2.0 %   nRBC 0  0 - 0 %  COMPREHENSIVE METABOLIC PANEL (IH47)     Status: Abnormal   Collection Time    05/29/14  1:27 PM      Result Value Ref Range   Sodium 140  136 - 145 mEq/L   Potassium 5.3 (*) 3.5 - 5.1 mEq/L   Chloride 111 (*) 98 - 109 mEq/L   CO2 21 (*) 22 - 29 mEq/L   Glucose 141 (*) 70 - 140 mg/dl   BUN 97.4 (*) 7.0 - 26.0 mg/dL   Creatinine 6.6 (*) 0.7 - 1.3 mg/dL   Total Bilirubin 0.33  0.20 - 1.20 mg/dL   Alkaline Phosphatase 64  40 - 150 U/L   AST 9  5 - 34 U/L   ALT 12  0 - 55 U/L   Total Protein 7.5  6.4 - 8.3 g/dL   Albumin 3.2 (*) 3.5 - 5.0 g/dL   Calcium 8.4  8.4 - 10.4 mg/dL   Anion Gap 8  3 - 11 mEq/L       RADIOGRAPHY: No results found.     REVIEW OF SYSTEMS:  Constitutional: Denies fevers, chills or abnormal weight loss Eyes: Denies blurriness of vision Ears, nose, mouth, throat, and face: Denies mucositis or sore throat Respiratory: Denies cough, dyspnea or wheezes Cardiovascular: Denies palpitation, chest discomfort; He reports lower extremity swelling and PND and dyspnea on exertion Gastrointestinal:  Denies nausea, heartburn or change in bowel habits Skin: Denies abnormal skin  rashes Lymphatics: Denies new lymphadenopathy or easy bruising Neurological:Denies numbness, tingling or new weaknesses Behavioral/Psych: Mood is stable, no new changes  All other systems were reviewed with the patient and are negative.  PHYSICAL EXAM:  height is _0  (1.803 m) and weight is 285 lb 3.2 oz (129.366 kg). His oral temperature  is 98.4 F (36.9 C). His blood pressure is 183/62 and his pulse is 55. His respiration is 18 and oxygen saturation is 100%.    GENERAL:alert, no distress and comfortable; morbidly obese SKIN: skin color, texture, turgor are normal, no rashes or significant lesions EYES: normal, Conjunctiva are pink and non-injected, sclera clear OROPHARYNX:no exudate, no erythema and lips, buccal mucosa, and tongue normal  NECK: supple, thyroid normal size, non-tender, without nodularity LYMPH:  no palpable lymphadenopathy in the cervical, axillary or inguinal LUNGS: clear to auscultation and percussion with normal breathing effort HEART: regular rate & rhythm and no murmurs and 2-3 +  lower extremity edema with compression stockings  ABDOMEN:abdomen soft, non-tender and normal bowel sounds Musculoskeletal:no cyanosis of digits and no clubbing; L forearm vistula with thrill.  NEURO: alert & oriented x 3 with fluent speech, no focal motor/sensory deficits   IMPRESSION: DARBY SHADWICK is a 63 y.o. male with a history of    PLAN:  1.  MGUS/Smothering multiple myeloma. --We will repeat his labs including a repeat serum protein electrophoresis, quantitative immunoglobulin levels.  We will also obtain a 24 hour urine for urine protein electrophoresis with IFE.  He will require a repeat skeletal survey in the upcoming week.  Based on review of above, he might also require a repeat bone marrow biopsy.   We spent some time discussing monoclonal gammopathies and potential treatments for multiple myeloma.    2. ESRDz --Patient has an AV fistula.  He is being followed by renal  for consideration of hemodialysis. His creatinine is 6.3.   3. Hyperkalemia, mild. --Likely secondary to #2.  We will give kayexalate x one.   He will have labs repeated upon return visit.  I handout was provided for symptoms of hyperkalemia. His potassium is 5.2.   4. Anemia of chronic kidney disease. --Continue aranesp to maintain a hemoglobin greater than 10.  His hemoglobin is 8.8.   5. DM2. --continue glipizide 10 mg daily before breakfast.   6. Hypertension, mildly controlled. --Continue amlodipine 10 mg daily, hydralazine 100 mg by mouth bid.  BP is 183/62.   7. CHF. --Continue Aspirin 81 mg, Lasix 80 mg bid and carvedilol 25 mg bid.  8. Morbid obesity.  --Continue diet and exercise as tolerated.   9. Thrombocytopenia. --Unclear etiology.  Possible due to progression of #1.    10. Follow-up. --Patient will require the above studies including a skeletal survey and return visit in 3 weeks.   All questions were answered. The patient knows to call the clinic with any problems, questions or concerns. We can certainly see the patient much sooner if necessary.  Records were reviewed extensively in preparation for this visit.   I spent 40 minutes counseling the patient face to face. The total time spent in the appointment was 60 minutes.    CHISM, DAVID, MD 05/30/2014 9:09 AM

## 2014-06-02 LAB — KAPPA/LAMBDA LIGHT CHAINS
Kappa free light chain: 33.2 mg/dL — ABNORMAL HIGH (ref 0.33–1.94)
Kappa:Lambda Ratio: 5.08 — ABNORMAL HIGH (ref 0.26–1.65)
Lambda Free Lght Chn: 6.54 mg/dL — ABNORMAL HIGH (ref 0.57–2.63)

## 2014-06-02 LAB — SPEP & IFE WITH QIG
Albumin ELP: 50.5 % — ABNORMAL LOW (ref 55.8–66.1)
Alpha-1-Globulin: 4.9 % (ref 2.9–4.9)
Alpha-2-Globulin: 10.5 % (ref 7.1–11.8)
Beta 2: 5.5 % (ref 3.2–6.5)
Beta Globulin: 5.5 % (ref 4.7–7.2)
GAMMA GLOBULIN: 23.1 % — AB (ref 11.1–18.8)
IGM, SERUM: 51 mg/dL (ref 41–251)
IgA: 248 mg/dL (ref 68–379)
IgG (Immunoglobin G), Serum: 1760 mg/dL — ABNORMAL HIGH (ref 650–1600)
M-Spike, %: 1.01 g/dL
Total Protein, Serum Electrophoresis: 7.2 g/dL (ref 6.0–8.3)

## 2014-06-04 NOTE — Telephone Encounter (Signed)
Pt # does not have a voicemail box that is setup yet. Called Pt to schedule for LDL lab work as part of DM care. Please make appointment upon returned call.

## 2014-06-05 ENCOUNTER — Encounter (HOSPITAL_COMMUNITY)
Admission: RE | Admit: 2014-06-05 | Discharge: 2014-06-05 | Disposition: A | Discharge status: Home / Self Care | Payer: Medicare Other | Source: Ambulatory Visit | Attending: Nephrology | Admitting: Nephrology

## 2014-06-05 ENCOUNTER — Ambulatory Visit (HOSPITAL_COMMUNITY)
Admission: RE | Admit: 2014-06-05 | Discharge: 2014-06-05 | Disposition: A | Discharge status: Home / Self Care | Payer: Medicare Other | Source: Ambulatory Visit | Attending: Internal Medicine | Admitting: Internal Medicine

## 2014-06-05 VITALS — BP 165/63 | HR 56 | Resp 18

## 2014-06-05 DIAGNOSIS — N183 Chronic kidney disease, stage 3 unspecified: Secondary | ICD-10-CM

## 2014-06-05 DIAGNOSIS — D472 Monoclonal gammopathy: Secondary | ICD-10-CM | POA: Insufficient documentation

## 2014-06-05 LAB — IRON AND TIBC
Iron: 72 ug/dL (ref 42–135)
SATURATION RATIOS: 29 % (ref 20–55)
TIBC: 251 ug/dL (ref 215–435)
UIBC: 179 ug/dL (ref 125–400)

## 2014-06-05 LAB — POCT HEMOGLOBIN-HEMACUE: Hemoglobin: 8.2 g/dL — ABNORMAL LOW (ref 13.0–17.0)

## 2014-06-05 LAB — FERRITIN: FERRITIN: 250 ng/mL (ref 22–322)

## 2014-06-05 MED ORDER — DARBEPOETIN ALFA-POLYSORBATE 100 MCG/0.5ML IJ SOLN
100.0000 ug | INTRAMUSCULAR | Status: DC
  Administered 2014-06-05: 100 ug via SUBCUTANEOUS

## 2014-06-05 MED ORDER — DARBEPOETIN ALFA-POLYSORBATE 100 MCG/0.5ML IJ SOLN
INTRAMUSCULAR | Status: AC
  Filled 2014-06-05: qty 0.5

## 2014-06-09 LAB — UIFE/LIGHT CHAINS/TP QN, 24-HR UR
Albumin, U: DETECTED
Alpha 1, Urine: DETECTED — AB
Alpha 2, Urine: DETECTED — AB
BETA UR: DETECTED — AB
FREE KAPPA LT CHAINS, UR: 31 mg/dL — AB (ref 0.14–2.42)
FREE LAMBDA LT CHAINS, UR: 2.46 mg/dL — AB (ref 0.02–0.67)
FREE LT CHN EXCR RATE: 651 mg/d
Free Kappa/Lambda Ratio: 12.6 ratio — ABNORMAL HIGH (ref 2.04–10.37)
Free Lambda Excretion/Day: 51.66 mg/d
Gamma Globulin, Urine: DETECTED — AB
Time: 24 hours
Total Protein, Urine-Ur/day: 3476 mg/d — ABNORMAL HIGH (ref 10–140)
Total Protein, Urine: 165.5 mg/dL
Volume, Urine: 2100 mL

## 2014-06-12 ENCOUNTER — Encounter: Payer: Self-pay | Admitting: Internal Medicine

## 2014-06-12 ENCOUNTER — Ambulatory Visit (HOSPITAL_BASED_OUTPATIENT_CLINIC_OR_DEPARTMENT_OTHER): Payer: Medicare Other | Admitting: Internal Medicine

## 2014-06-12 ENCOUNTER — Other Ambulatory Visit: Payer: Medicare Other

## 2014-06-12 VITALS — BP 153/60 | HR 60 | Temp 98.3°F | Resp 18 | Ht 71.0 in | Wt 276.7 lb

## 2014-06-12 DIAGNOSIS — N186 End stage renal disease: Secondary | ICD-10-CM

## 2014-06-12 DIAGNOSIS — IMO0002 Reserved for concepts with insufficient information to code with codable children: Secondary | ICD-10-CM

## 2014-06-12 DIAGNOSIS — E1165 Type 2 diabetes mellitus with hyperglycemia: Secondary | ICD-10-CM

## 2014-06-12 DIAGNOSIS — D696 Thrombocytopenia, unspecified: Secondary | ICD-10-CM

## 2014-06-12 DIAGNOSIS — I120 Hypertensive chronic kidney disease with stage 5 chronic kidney disease or end stage renal disease: Secondary | ICD-10-CM

## 2014-06-12 DIAGNOSIS — D472 Monoclonal gammopathy: Secondary | ICD-10-CM

## 2014-06-12 DIAGNOSIS — D631 Anemia in chronic kidney disease: Secondary | ICD-10-CM

## 2014-06-12 DIAGNOSIS — I509 Heart failure, unspecified: Secondary | ICD-10-CM

## 2014-06-12 DIAGNOSIS — N039 Chronic nephritic syndrome with unspecified morphologic changes: Secondary | ICD-10-CM

## 2014-06-12 DIAGNOSIS — E118 Type 2 diabetes mellitus with unspecified complications: Secondary | ICD-10-CM

## 2014-06-12 NOTE — Progress Notes (Signed)
[name], [name] [address] [place] [address]  DIAGNOSIS: MGUS (monoclonal gammopathy of unknown significance) - Plan: CT Biopsy, CBC with Differential, Comprehensive metabolic panel (Cmet) - [facility], Lactate dehydrogenase (LDH) - [facility], IgG, IgA, IgM, Kappa/lambda light chains  End stage renal disease - Plan: CBC with Differential, Comprehensive metabolic panel (Cmet) - [facility], Lactate dehydrogenase (LDH) - [facility], IgG, IgA, IgM, Kappa/lambda light chains  DIABETES MELLITUS, II, COMPLICATIONS  Chief Complaint  Patient presents with  . Follow-up    CURRENT TREATMENT: Observation.  INTERVAL HISTORY: [name] 63 y.o. male male with an extensive medical history including CKDz s/p AV Fistula (not currently on dialysis) CHF, peripheral vascular disease and DM2 on oral anti glycemic medications who was referred by Dr. Marily [name] for his history of MGUS/ Smothering Multiple myeloma and seen on 05/29/2014. He receives aranesp every 2 weeks secondary to anemia of chronic kidney disease. He had an arteriovenous fistula placed by Dr. Sherren [name] Early on 03/21/2014. He is compliant to lasix 80 mg bid.   Previously on 10/16/2008, the patient was seen by Dr. Jamse [name]. He was being evaluated for chronic kidney disease and part of his work up essentially noted that he had a positive SPEP. His M-protein was elevated at 0.4 g/dL with an IgG monoclonal gammopathy. His IgG and IgA immunoglobulins were elevated at 1,895 and 563 respectively; His IgM was 139. The patient at that time denied anorexia, weight loss and bone pain. He received a renal ultrasound on 05/18/2007 that was unremarkable. The patient had a bone marrow biopsy and a skeletal survey and urine studies. Upon repeat visit with Dr. Jamse [name] in 11/21/2008, his SPEP showed a restricted band displaying a monoclonal protein measuring 0.52 g/dL. IFE showed a monoclonal IgG kappa protein; urine IFE showed bence  jones proteinuria; IgG elevated at [facility], IgA elevated at 554, IgM normal at 135. A skeletal survey on 10/21/2008 showed no findings that suggested multiple myeloma. There was a small single lytic focus in the C5 vertebral body, which was felt to represent a nonspecific finding a that time. The patient had a bone marrow biopsy with aspiration, which essentially showed a normocellular marrow with trilineage hematopoiesis with 5% plasma cells. FISH for cytogenetics was essentially normal.   Dr. Jamse [name] felt that his lab data indicated very early stage of smoldering multiple myeloma. He was told that his risk of converting to full blown myeloma was increased and he required close follow up but did not require treatment at that time. He was then lost to follow up. Today, he returns to discuss the results of his laboratory testing   MEDICAL HISTORY: Past Medical History  Diagnosis Date  . Diabetes mellitus   . Hypertension   . Hyperlipidemia   . Chronic kidney disease   . PVD (peripheral vascular disease)   . Venous insufficiency   . Morbid obesity   . Chronic cystitis   . Arthritis   . UMBILICAL HERNIA 3/81/8299    Qualifier: History of  By: [name], [name]    . NEPHROLITHIASIS, HX OF 12/02/2009    Qualifier: Diagnosis of  By: Ta [name], [name]    . Lymphedema   . CHF (congestive heart failure)   . Asthma     as a child  . H/O hiatal hernia     states it's been fixed  . Dry skin     INTERIM HISTORY: has ONYCHOMYCOSIS; DIABETES MELLITUS, II, COMPLICATIONS; Hyperlipidemia LDL goal <  100; OBESITY, MORBID; HYPERTENSION, BENIGN SYSTEMIC; PERIPHERAL VASCULAR DISEASE, UNSPEC.; VENOUS INSUFFICIENCY, LEGS; CHRONIC KIDNEY DISEASE STAGE III (MODERATE); ORGANIC IMPOTENCE; Knee pain; Mass of thigh; Special screening for malignant neoplasms, colon; Diastolic CHF; Stasis dermatitis; End stage renal disease; and MGUS (monoclonal gammopathy of unknown significance) on his problem list.    ALLERGIES:  is allergic  to shrimp and iodine.  MEDICATIONS: has a current medication list which includes the following prescription(s): amlodipine, aspirin, atorvastatin, calcitriol, carvedilol, furosemide, glipizide, hydralazine, sodium bicarbonate, and sodium polystyrene.  SURGICAL HISTORY:  Past Surgical History  Procedure Laterality Date  . R knee arthoscopic repair of meniscus    . Umbilical hernia repair    . Amputation      Right and left fifth toes.   . Popliteal to posterior tibial bypass      2006  . Eye surgery Bilateral     cataract and lens implant  . Colonoscopy    . Hernia repair    . Av fistula placement Left 03/21/2014    Procedure: ARTERIOVENOUS (AV) FISTULA CREATION with ultrasound;  Surgeon: [name], [name];  Location: [facility];  Service: Vascular;  Laterality: Left;    REVIEW OF SYSTEMS:   Constitutional: Denies fevers, chills or abnormal weight loss Eyes: Denies blurriness of vision Ears, nose, mouth, throat, and face: Denies mucositis or sore throat Respiratory: Denies cough, dyspnea or wheezes Cardiovascular: Denies palpitation, chest discomfort or lower extremity swelling Gastrointestinal:  Denies nausea, heartburn or change in bowel habits Skin: Denies abnormal skin rashes Lymphatics: Denies new lymphadenopathy or easy bruising Neurological:Denies numbness, tingling or new weaknesses Behavioral/Psych: Mood is stable, no new changes  All other systems were reviewed with the patient and are negative.  PHYSICAL EXAMINATION: ECOG PERFORMANCE STATUS: 1 - Symptomatic but completely ambulatory  Blood pressure 153/60, pulse 60, temperature 98.3 F (36.8 C), temperature source Oral, resp. rate 18, height _0  (1.803 m), weight 276 lb 11.2 oz (125.51 kg).  GENERAL:alert, no distress and comfortable; morbidly obese, easily mobile to exam table. Ambulates with the assistance of a cane. SKIN: skin color, texture, turgor are normal, no rashes or significant lesions  EYES: normal,  Conjunctiva are pink and non-injected, sclera clear  OROPHARYNX:no exudate, no erythema and lips, buccal mucosa, and tongue normal  NECK: supple, thyroid normal size, non-tender, without nodularity  LYMPH: no palpable lymphadenopathy in the cervical, axillary or inguinal  LUNGS: clear to auscultation and percussion with normal breathing effort  HEART: regular rate & rhythm and no murmurs and 2-3 + lower extremity edema with compression stockings  ABDOMEN:abdomen soft, non-tender and normal bowel sounds  Musculoskeletal:no cyanosis of digits and no clubbing; L forearm vistula with thrill.  NEURO: alert & oriented x 3 with fluent speech, no focal motor/sensory deficits   Labs:  Lab Results  Component Value Date   WBC 10.0 05/29/2014   HGB 8.2* 06/05/2014   HCT 26.5* 05/29/2014   MCV 83.6 05/29/2014   PLT 122 repeated and verified* 05/29/2014   NEUTROABS 7.2* 05/29/2014      Chemistry      Component Value Date/Time   NA 140 05/29/2014 1327   NA 139 03/21/2014 0820   K 5.3* 05/29/2014 1327   K 5.8* 03/21/2014 0820   CL 99 03/11/2014 0550   CO2 21* 05/29/2014 1327   CO2 23 03/11/2014 0550   BUN 97.4* 05/29/2014 1327   BUN 84* 03/11/2014 0550   CREATININE 6.6* 05/29/2014 1327   CREATININE 7.04* 03/11/2014 0550   CREATININE  5.95* 03/04/2014 1113      Component Value Date/Time   CALCIUM 8.4 05/29/2014 1327   CALCIUM 8.4 03/11/2014 0550   CALCIUM 7.5* 03/07/2014 1909   ALKPHOS 64 05/29/2014 1327   ALKPHOS 72 10/14/2013 1239   AST 9 05/29/2014 1327   AST 12 10/14/2013 1239   ALT 12 05/29/2014 1327   ALT 11 10/14/2013 1239   BILITOT 0.33 05/29/2014 1327   BILITOT 0.2* 10/14/2013 1239       Basic Metabolic Panel: No results found for this basename: NA, K, CL, CO2, GLUCOSE, BUN, CREATININE, CALCIUM, MG, PHOS,  in the last 168 hours GFR Estimated Creatinine Clearance: 15.1 ml/min (by C-G formula based on Cr of 6.6). Liver Function Tests: No results found for this basename: AST, ALT, ALKPHOS, BILITOT,  PROT, ALBUMIN,  in the last 168 hours No results found for this basename: LIPASE, AMYLASE,  in the last 168 hours No results found for this basename: AMMONIA,  in the last 168 hours Coagulation profile No results found for this basename: INR, PROTIME,  in the last 168 hours  CBC: No results found for this basename: WBC, NEUTROABS, HGB, HCT, MCV, PLT,  in the last 168 hours  Anemia work up No results found for this basename: VITAMINB12, FOLATE, FERRITIN, TIBC, IRON, RETICCTPCT,  in the last 72 hours  Studies:  No results found.   RADIOGRAPHIC STUDIES: Dg Bone Survey Met  06/05/2014   CLINICAL DATA:  Monoclonal gammopathy of unknown significance, evaluate for osseous lesions  EXAM: METASTATIC BONE SURVEY  COMPARISON:  None.  FINDINGS: No skull lesions.  Degenerative change noted in the cervical spine most prominently at C5-C6 and C6-C7. No compression deformity allowing for degenerative change. Minimal leftward curvature at C6. No osseous lesions identified.  Five lumbar type vertebral bodies are identified. The no osseous lesion identified.  Bony pelvis is unremarkable.  No lung bone lytic or sclerotic osseous lesion is identified in either lower or upper extremity. Clips project over the soft tissues of the right lower leg. Bilateral mild knee degenerative change identified.  Mild bilateral AC joint degenerative change.  Mild atheromatous aortic calcification without calcified aneurysm.  Surgical clips project adjacent to the distal left radius.  IMPRESSION: No lytic or sclerotic osseous lesion at metastatic bone survey as above.   Electronically Signed   By: Conchita Paris M.D.   On: 06/05/2014 09:29    ASSESSMENT: [name] 63 y.o. male with a history of MGUS (monoclonal gammopathy of unknown significance) - Plan: CT Biopsy, CBC with Differential, Comprehensive metabolic panel (Cmet) - [facility], Lactate dehydrogenase (LDH) - [facility], IgG, IgA, IgM, Kappa/lambda light chains  End stage renal  disease - Plan: CBC with Differential, Comprehensive metabolic panel (Cmet) - [facility], Lactate dehydrogenase (LDH) - [facility], IgG, IgA, IgM, Kappa/lambda light chains  DIABETES MELLITUS, II, COMPLICATIONS   PLAN:  1. MGUS/Smothering multiple myeloma.  --We will repeated his labs including a repeat serum protein electrophoresis, quantitative immunoglobulin levels. M-Spike % was 1.01 and IgG was 1760 with a Kappa:lamba ratio of 5.08. We also obtained a 24 hour urine for urine protein electrophoresis with IFE. He had a total 3,476 mg in 24 hours with associated IgG kappa light chains. A repeat skeletal survey was negative. . Based on review of above, he will also require a repeat bone marrow biopsy. It has been scheduled and he will return for a brief visit to discuss the results.    2. ESRDz  --Patient has an AV fistula.  He is being followed by renal for consideration of hemodialysis. His creatinine is 6.3.   3. Anemia of chronic kidney disease.  --Continue aranesp to maintain a hemoglobin greater than 10. His hemoglobin is 8.8. Iron studies were negative.   4. DM2.  --continue glipizide 10 mg daily before breakfast.   5. Hypertension, mildly controlled.  --Continue amlodipine 10 mg daily, hydralazine 100 mg by mouth bid. BP is 153/60.   6. CHF.  --Continue Aspirin 81 mg, Lasix 80 mg bid and carvedilol 25 mg bid.   7. Morbid obesity.  --Continue diet and exercise as tolerated.   8. Thrombocytopenia.  --Unclear etiology. Possible due to progression of #1.   9. Follow-up.  --Patient will require a bone marrow biopsy and return visit in 3 weeks.   All questions were answered. The patient knows to call the clinic with any problems, questions or concerns. We can certainly see the patient much sooner if necessary.  I spent 15 minutes counseling the patient face to face. The total time spent in the appointment was 25 minutes.    Jamaree Hosier, [name] 06/13/2014 8:13 AM

## 2014-06-12 NOTE — Patient Instructions (Signed)
Bone Marrow Aspiration and Bone Biopsy Examination of the bone marrow is a valuable test to diagnose blood disorders. A bone marrow biopsy takes a sample of bone and a small amount of fluid and cells from inside the bone. A bone marrow aspiration removes only the marrow. Bone marrow aspiration and bone biopsies are used to stage different disorders of the blood, such as leukemia. Staging will help your caregiver understand how far the disease has progressed.  The tests are also useful in diagnosing:  Fever of unknown origin (FUO).  Bacterial infections and other widespread fungal infections.  Cancers that have spread (metastasized) to the bone marrow.  Diseases that are characterized by a deficiency of an enzyme (storage diseases). This includes:  Niemann-Pick disease.  Gaucher disease. PROCEDURE  Sites used to get samples include:   Back of your hip bone (posterior iliac crest).  Both aspiration and biopsy.  Front of your hip bone (anterior iliac crest).  Both aspiration and biopsy.  Breastbone (sternum).  Aspiration from your breastbone (done only in adults). This method is rarely used. When you get a hip bone aspiration:  You are placed lying on your side with the upper knee brought up and flexed with the lower leg straight.  The site is prepared, cleaned with an antiseptic scrub, and draped. This keeps the biopsy area clean.  The skin and the area down to the lining of the bone (periosteum) are made numb with a local anesthetic.  The bone marrow aspiration needle is inserted. You will feel pressure on your bone.  Once inside the marrow cavity, a sample of bone marrow is sucked out (aspirated) for pathology slides.  The material collected for bone marrow slides is processed immediately by a technologist.  The technician selects the marrow particles to make the slides for pathology.  The marrow aspiration needle is removed. Then pressure is applied to the site with  gauze until bleeding has stopped. Following an aspiration, a bone marrow biopsy may be performed as well. The technique for this is very similar. A dressing is then applied.  RISKS AND COMPLICATIONS  The main complications of a bone marrow aspiration and biopsy include infection and bleeding.  Complications are uncommon. The procedure may not be performed in patients with bleeding tendencies.  A very rare complication from the procedure is injury to the heart during a breastbone (sternal) marrow aspiration. Only bone marrow aspirations are performed in this area.  Long-lasting pain at the site of the bone marrow aspiration and biopsy is uncommon. Your caregiver will let you know when you are to get your results and will discuss them with you. You may make an appointment with your caregiver to find out the results. Do not assume everything is normal if you have not heard from your caregiver or the medical facility. It is important for you to follow up on all of your test results. Document Released: 12/01/2004 Document Revised: 02/20/2012 Document Reviewed: 11/25/2008 [facility] Patient Information 2015 [place], [place]. This information is not intended to replace advice given to you by your health care provider. Make sure you discuss any questions you have with your health care provider.

## 2014-06-13 ENCOUNTER — Telehealth: Payer: Self-pay | Admitting: Internal Medicine

## 2014-06-13 NOTE — Telephone Encounter (Signed)
Talked to pt and gave him appt for lab and MD on 7/23 he is aware of Biopsy on 7/16

## 2014-06-16 ENCOUNTER — Encounter (HOSPITAL_COMMUNITY): Payer: Self-pay | Admitting: Pharmacy Technician

## 2014-06-18 ENCOUNTER — Other Ambulatory Visit (HOSPITAL_COMMUNITY): Payer: Self-pay | Admitting: *Deleted

## 2014-06-19 ENCOUNTER — Encounter (HOSPITAL_COMMUNITY)
Admission: RE | Admit: 2014-06-19 | Discharge: 2014-06-19 | Disposition: A | Discharge status: Home / Self Care | Payer: Medicare Other | Source: Ambulatory Visit | Attending: Nephrology | Admitting: Nephrology

## 2014-06-19 VITALS — BP 167/64 | HR 54 | Temp 98.1°F | Resp 20

## 2014-06-19 DIAGNOSIS — N039 Chronic nephritic syndrome with unspecified morphologic changes: Secondary | ICD-10-CM

## 2014-06-19 DIAGNOSIS — N183 Chronic kidney disease, stage 3 unspecified: Secondary | ICD-10-CM | POA: Insufficient documentation

## 2014-06-19 DIAGNOSIS — D631 Anemia in chronic kidney disease: Secondary | ICD-10-CM | POA: Insufficient documentation

## 2014-06-19 LAB — POCT HEMOGLOBIN-HEMACUE: Hemoglobin: 9 g/dL — ABNORMAL LOW (ref 13.0–17.0)

## 2014-06-19 MED ORDER — DARBEPOETIN ALFA-POLYSORBATE 200 MCG/0.4ML IJ SOLN
200.0000 ug | INTRAMUSCULAR | Status: DC
  Administered 2014-06-19: 200 ug via SUBCUTANEOUS

## 2014-06-19 MED ORDER — DARBEPOETIN ALFA-POLYSORBATE 200 MCG/0.4ML IJ SOLN
INTRAMUSCULAR | Status: AC
  Filled 2014-06-19: qty 0.4

## 2014-06-24 ENCOUNTER — Telehealth: Payer: Self-pay | Admitting: Medical Oncology

## 2014-06-24 ENCOUNTER — Telehealth: Payer: Self-pay | Admitting: Internal Medicine

## 2014-06-24 ENCOUNTER — Other Ambulatory Visit: Payer: Self-pay | Admitting: Radiology

## 2014-06-24 NOTE — Telephone Encounter (Signed)
Pt called [name] in scheduling requesting his bone marrow biopsy and MD visit be cancelled. She did not cancel the appointment and wanted to make Dr. Juliann [name] aware of pt's request. I called the pt to inquire why he did not want to keep appointments. He states he had a bone marrow biopsy in the past and is not sure why he needs to do other. I explained that Dr. Juliann [name] ordered this to re-evaluated his bone marrow.  This will determine if he will need any treatment or continue surveillance. He stated his son had a biopsy and later died from an infection.  He is going to rethink his decision and he will call be back if he wants to cancel. [name] notified.

## 2014-06-24 NOTE — Telephone Encounter (Signed)
returned pts call re cx some appts. per pt cx bx 7/16 and f/u 7/23. message to desk nurse. waiting on response from  desk before proceeding w/cancellation.

## 2014-06-25 ENCOUNTER — Telehealth: Payer: Self-pay | Admitting: Internal Medicine

## 2014-06-25 ENCOUNTER — Other Ambulatory Visit (HOSPITAL_COMMUNITY): Payer: Medicare Other

## 2014-06-25 NOTE — Telephone Encounter (Signed)
steve from radiology came by and asked if pt was going to have [place]...i called and s.w pt and he stated no he did not want to have BX and will just come to lab and MD visit. Richardson Landry from radiology aware and will cx appt.

## 2014-06-26 ENCOUNTER — Ambulatory Visit (HOSPITAL_COMMUNITY)
Admission: RE | Admit: 2014-06-26 | Discharge: 2014-06-26 | Disposition: A | Discharge status: Home / Self Care | Payer: Medicare Other | Source: Ambulatory Visit | Attending: Internal Medicine | Admitting: Internal Medicine

## 2014-06-26 ENCOUNTER — Ambulatory Visit (HOSPITAL_COMMUNITY): Admission: RE | Admit: 2014-06-26 | Payer: Medicare Other | Source: Ambulatory Visit

## 2014-07-03 ENCOUNTER — Encounter (HOSPITAL_COMMUNITY)
Admission: RE | Admit: 2014-07-03 | Discharge: 2014-07-03 | Disposition: A | Discharge status: Home / Self Care | Payer: Medicare Other | Source: Ambulatory Visit | Attending: Nephrology | Admitting: Nephrology

## 2014-07-03 ENCOUNTER — Encounter: Payer: Self-pay | Admitting: Medical Oncology

## 2014-07-03 ENCOUNTER — Other Ambulatory Visit: Payer: Self-pay | Admitting: Medical Oncology

## 2014-07-03 ENCOUNTER — Other Ambulatory Visit (HOSPITAL_BASED_OUTPATIENT_CLINIC_OR_DEPARTMENT_OTHER): Payer: Medicare Other

## 2014-07-03 ENCOUNTER — Telehealth: Payer: Self-pay | Admitting: Internal Medicine

## 2014-07-03 ENCOUNTER — Ambulatory Visit: Payer: Medicare Other

## 2014-07-03 VITALS — BP 144/55 | HR 51 | Temp 97.8°F | Resp 20

## 2014-07-03 DIAGNOSIS — N183 Chronic kidney disease, stage 3 unspecified: Secondary | ICD-10-CM | POA: Diagnosis not present

## 2014-07-03 DIAGNOSIS — N186 End stage renal disease: Secondary | ICD-10-CM

## 2014-07-03 DIAGNOSIS — D696 Thrombocytopenia, unspecified: Secondary | ICD-10-CM

## 2014-07-03 DIAGNOSIS — D472 Monoclonal gammopathy: Secondary | ICD-10-CM

## 2014-07-03 DIAGNOSIS — N039 Chronic nephritic syndrome with unspecified morphologic changes: Secondary | ICD-10-CM

## 2014-07-03 DIAGNOSIS — D631 Anemia in chronic kidney disease: Secondary | ICD-10-CM

## 2014-07-03 LAB — COMPREHENSIVE METABOLIC PANEL (CC13)
ALK PHOS: 52 U/L (ref 40–150)
ALT: 13 U/L (ref 0–55)
AST: 10 U/L (ref 5–34)
Albumin: 3.2 g/dL — ABNORMAL LOW (ref 3.5–5.0)
Anion Gap: 12 mEq/L — ABNORMAL HIGH (ref 3–11)
BUN: 93.6 mg/dL — AB (ref 7.0–26.0)
CO2: 20 mEq/L — ABNORMAL LOW (ref 22–29)
CREATININE: 7.3 mg/dL — AB (ref 0.7–1.3)
Calcium: 8.6 mg/dL (ref 8.4–10.4)
Chloride: 110 mEq/L — ABNORMAL HIGH (ref 98–109)
Glucose: 136 mg/dl (ref 70–140)
Potassium: 4.4 mEq/L (ref 3.5–5.1)
Sodium: 142 mEq/L (ref 136–145)
Total Bilirubin: 0.34 mg/dL (ref 0.20–1.20)
Total Protein: 7.3 g/dL (ref 6.4–8.3)

## 2014-07-03 LAB — IRON AND TIBC
Iron: 45 ug/dL (ref 42–135)
SATURATION RATIOS: 16 % — AB (ref 20–55)
TIBC: 274 ug/dL (ref 215–435)
UIBC: 229 ug/dL (ref 125–400)

## 2014-07-03 LAB — CBC WITH DIFFERENTIAL/PLATELET
BASO%: 0.8 % (ref 0.0–2.0)
Basophils Absolute: 0.1 10*3/uL (ref 0.0–0.1)
EOS%: 7.7 % — ABNORMAL HIGH (ref 0.0–7.0)
Eosinophils Absolute: 0.6 10*3/uL — ABNORMAL HIGH (ref 0.0–0.5)
HCT: 29 % — ABNORMAL LOW (ref 38.4–49.9)
HEMOGLOBIN: 9.4 g/dL — AB (ref 13.0–17.1)
LYMPH%: 17.9 % (ref 14.0–49.0)
MCH: 27.8 pg (ref 27.2–33.4)
MCHC: 32.3 g/dL (ref 32.0–36.0)
MCV: 86.1 fL (ref 79.3–98.0)
MONO#: 0.7 10*3/uL (ref 0.1–0.9)
MONO%: 7.8 % (ref 0.0–14.0)
NEUT#: 5.5 10*3/uL (ref 1.5–6.5)
NEUT%: 65.8 % (ref 39.0–75.0)
PLATELETS: 136 10*3/uL — AB (ref 140–400)
RBC: 3.37 10*6/uL — AB (ref 4.20–5.82)
RDW: 16.3 % — ABNORMAL HIGH (ref 11.0–14.6)
WBC: 8.3 10*3/uL (ref 4.0–10.3)
lymph#: 1.5 10*3/uL (ref 0.9–3.3)

## 2014-07-03 LAB — LACTATE DEHYDROGENASE (CC13): LDH: 194 U/L (ref 125–245)

## 2014-07-03 LAB — FERRITIN: Ferritin: 147 ng/mL (ref 22–322)

## 2014-07-03 MED ORDER — DARBEPOETIN ALFA-POLYSORBATE 200 MCG/0.4ML IJ SOLN
INTRAMUSCULAR | Status: AC
  Filled 2014-07-03: qty 0.4

## 2014-07-03 MED ORDER — DARBEPOETIN ALFA-POLYSORBATE 200 MCG/0.4ML IJ SOLN
200.0000 ug | INTRAMUSCULAR | Status: DC
  Administered 2014-07-03: 200 ug via SUBCUTANEOUS

## 2014-07-03 NOTE — Progress Notes (Signed)
Pt came in to have labs drawn. He stated he was not aware he had an appointment to see Dr. Juliann Mule. Pt did not stay. I spoke with Dr. Juliann Mule and he would like to reschedule the pt to come in next week and discuss his labs. POF made.

## 2014-07-03 NOTE — Telephone Encounter (Signed)
s.w. pt to r/s appt...pt advised that he did not want to r/s at this time and will call back to r/s

## 2014-07-04 ENCOUNTER — Other Ambulatory Visit: Payer: Medicare Other

## 2014-07-04 ENCOUNTER — Ambulatory Visit: Payer: Medicare Other

## 2014-07-04 LAB — KAPPA/LAMBDA LIGHT CHAINS
KAPPA FREE LGHT CHN: 37.1 mg/dL — AB (ref 0.33–1.94)
Kappa:Lambda Ratio: 5.96 — ABNORMAL HIGH (ref 0.26–1.65)
Lambda Free Lght Chn: 6.23 mg/dL — ABNORMAL HIGH (ref 0.57–2.63)

## 2014-07-04 LAB — IGG, IGA, IGM
IGA: 278 mg/dL (ref 68–379)
IGG (IMMUNOGLOBIN G), SERUM: 1820 mg/dL — AB (ref 650–1600)
IgM, Serum: 56 mg/dL (ref 41–251)

## 2014-07-17 ENCOUNTER — Encounter (HOSPITAL_COMMUNITY)
Admission: RE | Admit: 2014-07-17 | Discharge: 2014-07-17 | Disposition: A | Discharge status: Home / Self Care | Payer: Medicare Other | Source: Ambulatory Visit | Attending: Nephrology | Admitting: Nephrology

## 2014-07-17 VITALS — BP 165/75 | HR 50 | Temp 98.5°F | Resp 20

## 2014-07-17 DIAGNOSIS — N183 Chronic kidney disease, stage 3 unspecified: Secondary | ICD-10-CM | POA: Insufficient documentation

## 2014-07-17 LAB — POCT HEMOGLOBIN-HEMACUE: HEMOGLOBIN: 9.9 g/dL — AB (ref 13.0–17.0)

## 2014-07-17 MED ORDER — DARBEPOETIN ALFA-POLYSORBATE 200 MCG/0.4ML IJ SOLN
200.0000 ug | INTRAMUSCULAR | Status: DC
  Administered 2014-07-17: 200 ug via SUBCUTANEOUS

## 2014-07-17 MED ORDER — DARBEPOETIN ALFA-POLYSORBATE 200 MCG/0.4ML IJ SOLN
INTRAMUSCULAR | Status: AC
  Filled 2014-07-17: qty 0.4

## 2014-07-31 ENCOUNTER — Encounter (HOSPITAL_COMMUNITY)
Admission: RE | Admit: 2014-07-31 | Discharge: 2014-07-31 | Disposition: A | Discharge status: Home / Self Care | Payer: Medicare Other | Source: Ambulatory Visit | Attending: Nephrology | Admitting: Nephrology

## 2014-07-31 VITALS — BP 178/70 | HR 50 | Temp 98.0°F | Resp 20

## 2014-07-31 DIAGNOSIS — N183 Chronic kidney disease, stage 3 unspecified: Secondary | ICD-10-CM

## 2014-07-31 LAB — IRON AND TIBC
Iron: 39 ug/dL — ABNORMAL LOW (ref 42–135)
Saturation Ratios: 15 % — ABNORMAL LOW (ref 20–55)
TIBC: 267 ug/dL (ref 215–435)
UIBC: 228 ug/dL (ref 125–400)

## 2014-07-31 LAB — POCT HEMOGLOBIN-HEMACUE: Hemoglobin: 10.9 g/dL — ABNORMAL LOW (ref 13.0–17.0)

## 2014-07-31 LAB — FERRITIN: FERRITIN: 76 ng/mL (ref 22–322)

## 2014-07-31 MED ORDER — DARBEPOETIN ALFA-POLYSORBATE 200 MCG/0.4ML IJ SOLN
200.0000 ug | INTRAMUSCULAR | Status: DC
  Administered 2014-07-31: 200 ug via SUBCUTANEOUS

## 2014-07-31 MED ORDER — DARBEPOETIN ALFA-POLYSORBATE 200 MCG/0.4ML IJ SOLN
INTRAMUSCULAR | Status: AC
  Filled 2014-07-31: qty 0.4

## 2014-08-04 ENCOUNTER — Telehealth: Payer: Self-pay | Admitting: Home Health Services

## 2014-08-04 NOTE — Telephone Encounter (Signed)
Per member had diabetic eye exam at [facility], [address] and per doctor no need  for another eye exam.  [facility]-per Larwance Sachs will fax report from 10/23/12

## 2014-08-07 ENCOUNTER — Encounter (HOSPITAL_COMMUNITY): Payer: Medicare Other

## 2014-08-13 ENCOUNTER — Other Ambulatory Visit (HOSPITAL_COMMUNITY): Payer: Self-pay | Admitting: *Deleted

## 2014-08-14 ENCOUNTER — Encounter (HOSPITAL_COMMUNITY)
Admission: RE | Admit: 2014-08-14 | Discharge: 2014-08-14 | Disposition: A | Discharge status: Home / Self Care | Payer: Medicare Other | Source: Ambulatory Visit | Attending: Nephrology | Admitting: Nephrology

## 2014-08-14 VITALS — BP 142/61 | HR 47 | Temp 97.5°F | Resp 20 | Ht 71.0 in | Wt 272.0 lb

## 2014-08-14 DIAGNOSIS — N183 Chronic kidney disease, stage 3 unspecified: Secondary | ICD-10-CM | POA: Insufficient documentation

## 2014-08-14 LAB — POCT HEMOGLOBIN-HEMACUE: Hemoglobin: 11.7 g/dL — ABNORMAL LOW (ref 13.0–17.0)

## 2014-08-14 MED ORDER — DARBEPOETIN ALFA-POLYSORBATE 200 MCG/0.4ML IJ SOLN
INTRAMUSCULAR | Status: AC
  Filled 2014-08-14: qty 0.4

## 2014-08-14 MED ORDER — DARBEPOETIN ALFA-POLYSORBATE 200 MCG/0.4ML IJ SOLN
200.0000 ug | INTRAMUSCULAR | Status: DC
  Administered 2014-08-14: 200 ug via SUBCUTANEOUS

## 2014-08-14 MED ORDER — SODIUM CHLORIDE 0.9 % IV SOLN
510.0000 mg | Freq: Once | INTRAVENOUS | Status: AC
  Administered 2014-08-14: 510 mg via INTRAVENOUS
  Filled 2014-08-14: qty 17

## 2014-08-28 ENCOUNTER — Encounter (HOSPITAL_COMMUNITY)
Admission: RE | Admit: 2014-08-28 | Discharge: 2014-08-28 | Disposition: A | Discharge status: Home / Self Care | Payer: Medicare Other | Source: Ambulatory Visit | Attending: Nephrology | Admitting: Nephrology

## 2014-08-28 VITALS — BP 140/52 | HR 55 | Temp 97.8°F | Resp 20

## 2014-08-28 DIAGNOSIS — N183 Chronic kidney disease, stage 3 unspecified: Secondary | ICD-10-CM

## 2014-08-28 LAB — IRON AND TIBC
Iron: 47 ug/dL (ref 42–135)
Saturation Ratios: 19 % — ABNORMAL LOW (ref 20–55)
TIBC: 251 ug/dL (ref 215–435)
UIBC: 204 ug/dL (ref 125–400)

## 2014-08-28 LAB — POCT HEMOGLOBIN-HEMACUE: Hemoglobin: 12 g/dL — ABNORMAL LOW (ref 13.0–17.0)

## 2014-08-28 MED ORDER — DARBEPOETIN ALFA-POLYSORBATE 200 MCG/0.4ML IJ SOLN: 200.0000 ug | INTRAMUSCULAR | Status: DC

## 2014-08-29 LAB — FERRITIN: Ferritin: 147 ng/mL (ref 22–322)

## 2014-09-10 ENCOUNTER — Other Ambulatory Visit (HOSPITAL_COMMUNITY): Payer: Self-pay | Admitting: *Deleted

## 2014-09-11 ENCOUNTER — Encounter (HOSPITAL_COMMUNITY)
Admission: RE | Admit: 2014-09-11 | Discharge: 2014-09-11 | Disposition: A | Discharge status: Home / Self Care | Payer: Medicare Other | Source: Ambulatory Visit | Attending: Nephrology | Admitting: Nephrology

## 2014-09-11 VITALS — BP 172/62 | HR 51 | Temp 97.7°F | Resp 20 | Ht 71.0 in | Wt 274.0 lb

## 2014-09-11 DIAGNOSIS — N183 Chronic kidney disease, stage 3 unspecified: Secondary | ICD-10-CM

## 2014-09-11 LAB — POCT HEMOGLOBIN-HEMACUE: HEMOGLOBIN: 11.5 g/dL — AB (ref 13.0–17.0)

## 2014-09-11 MED ORDER — DARBEPOETIN ALFA-POLYSORBATE 100 MCG/0.5ML IJ SOLN
INTRAMUSCULAR | Status: AC
  Administered 2014-09-11: 100 ug via SUBCUTANEOUS
  Filled 2014-09-11: qty 0.5

## 2014-09-11 MED ORDER — SODIUM CHLORIDE 0.9 % IV SOLN
510.0000 mg | Freq: Once | INTRAVENOUS | Status: AC
  Administered 2014-09-11: 510 mg via INTRAVENOUS
  Filled 2014-09-11: qty 17

## 2014-09-11 MED ORDER — DARBEPOETIN ALFA-POLYSORBATE 60 MCG/0.3ML IJ SOLN
INTRAMUSCULAR | Status: AC
  Administered 2014-09-11: 60 ug via SUBCUTANEOUS
  Filled 2014-09-11: qty 0.3

## 2014-09-11 MED ORDER — DARBEPOETIN ALFA-POLYSORBATE 200 MCG/0.4ML IJ SOLN: 160.0000 ug | INTRAMUSCULAR | Status: DC

## 2014-09-25 ENCOUNTER — Encounter (HOSPITAL_COMMUNITY)
Admission: RE | Admit: 2014-09-25 | Discharge: 2014-09-25 | Disposition: A | Discharge status: Home / Self Care | Payer: Medicare Other | Source: Ambulatory Visit | Attending: Nephrology | Admitting: Nephrology

## 2014-09-25 VITALS — BP 164/70 | HR 57 | Temp 97.8°F | Resp 20

## 2014-09-25 DIAGNOSIS — N183 Chronic kidney disease, stage 3 unspecified: Secondary | ICD-10-CM

## 2014-09-25 LAB — IRON AND TIBC
IRON: 66 ug/dL (ref 42–135)
SATURATION RATIOS: 29 % (ref 20–55)
TIBC: 224 ug/dL (ref 215–435)
UIBC: 158 ug/dL (ref 125–400)

## 2014-09-25 LAB — FERRITIN: Ferritin: 393 ng/mL — ABNORMAL HIGH (ref 22–322)

## 2014-09-25 LAB — POCT HEMOGLOBIN-HEMACUE: Hemoglobin: 12.1 g/dL — ABNORMAL LOW (ref 13.0–17.0)

## 2014-09-25 MED ORDER — DARBEPOETIN ALFA-POLYSORBATE 200 MCG/0.4ML IJ SOLN: 160.0000 ug | INTRAMUSCULAR | Status: DC

## 2014-10-09 ENCOUNTER — Encounter (HOSPITAL_COMMUNITY)
Admission: RE | Admit: 2014-10-09 | Discharge: 2014-10-09 | Disposition: A | Discharge status: Home / Self Care | Payer: Medicare Other | Source: Ambulatory Visit | Attending: Nephrology | Admitting: Nephrology

## 2014-10-09 VITALS — BP 147/61 | HR 57 | Temp 98.2°F | Resp 20

## 2014-10-09 DIAGNOSIS — N183 Chronic kidney disease, stage 3 unspecified: Secondary | ICD-10-CM

## 2014-10-09 LAB — POCT HEMOGLOBIN-HEMACUE: HEMOGLOBIN: 11.3 g/dL — AB (ref 13.0–17.0)

## 2014-10-09 MED ORDER — DARBEPOETIN ALFA-POLYSORBATE 200 MCG/0.4ML IJ SOLN
INTRAMUSCULAR | Status: AC
  Filled 2014-10-09: qty 0.4

## 2014-10-09 MED ORDER — DARBEPOETIN ALFA-POLYSORBATE 200 MCG/0.4ML IJ SOLN
160.0000 ug | INTRAMUSCULAR | Status: DC
  Administered 2014-10-09: 160 ug via SUBCUTANEOUS

## 2014-10-23 ENCOUNTER — Encounter (HOSPITAL_COMMUNITY)
Admission: RE | Admit: 2014-10-23 | Discharge: 2014-10-23 | Disposition: A | Discharge status: Home / Self Care | Payer: Medicare Other | Source: Ambulatory Visit | Attending: Nephrology | Admitting: Nephrology

## 2014-10-23 DIAGNOSIS — N183 Chronic kidney disease, stage 3 unspecified: Secondary | ICD-10-CM

## 2014-10-23 LAB — POCT HEMOGLOBIN-HEMACUE: Hemoglobin: 10.7 g/dL — ABNORMAL LOW (ref 13.0–17.0)

## 2014-10-23 MED ORDER — DARBEPOETIN ALFA 200 MCG/0.4ML IJ SOSY: 160.0000 ug | PREFILLED_SYRINGE | INTRAMUSCULAR | Status: DC

## 2014-10-23 MED ORDER — DARBEPOETIN ALFA 60 MCG/0.3ML IJ SOSY
PREFILLED_SYRINGE | INTRAMUSCULAR | Status: AC
  Administered 2014-10-23: 60 ug via SUBCUTANEOUS
  Filled 2014-10-23: qty 0.3

## 2014-10-23 MED ORDER — DARBEPOETIN ALFA 100 MCG/0.5ML IJ SOSY
PREFILLED_SYRINGE | INTRAMUSCULAR | Status: AC
  Administered 2014-10-23: 100 ug
  Filled 2014-10-23: qty 0.5

## 2014-11-13 ENCOUNTER — Encounter (HOSPITAL_COMMUNITY)
Admission: RE | Admit: 2014-11-13 | Discharge: 2014-11-13 | Disposition: A | Discharge status: Home / Self Care | Payer: Medicare Other | Source: Ambulatory Visit | Attending: Nephrology | Admitting: Nephrology

## 2014-11-13 DIAGNOSIS — N183 Chronic kidney disease, stage 3 unspecified: Secondary | ICD-10-CM

## 2014-11-13 LAB — FERRITIN: FERRITIN: 371 ng/mL — AB (ref 22–322)

## 2014-11-13 LAB — POCT HEMOGLOBIN-HEMACUE: HEMOGLOBIN: 10.8 g/dL — AB (ref 13.0–17.0)

## 2014-11-13 LAB — IRON AND TIBC
Iron: 89 ug/dL (ref 42–135)
Saturation Ratios: 38 % (ref 20–55)
TIBC: 237 ug/dL (ref 215–435)
UIBC: 148 ug/dL (ref 125–400)

## 2014-11-13 MED ORDER — DARBEPOETIN ALFA 200 MCG/0.4ML IJ SOSY
160.0000 ug | PREFILLED_SYRINGE | INTRAMUSCULAR | Status: DC
  Administered 2014-11-13: 160 ug via SUBCUTANEOUS

## 2014-11-13 MED ORDER — DARBEPOETIN ALFA 200 MCG/0.4ML IJ SOSY
PREFILLED_SYRINGE | INTRAMUSCULAR | Status: AC
  Filled 2014-11-13: qty 0.4

## 2014-11-27 ENCOUNTER — Encounter (HOSPITAL_COMMUNITY)
Admission: RE | Admit: 2014-11-27 | Discharge: 2014-11-27 | Disposition: A | Discharge status: Home / Self Care | Payer: Medicare Other | Source: Ambulatory Visit | Attending: Nephrology | Admitting: Nephrology

## 2014-11-27 DIAGNOSIS — N183 Chronic kidney disease, stage 3 unspecified: Secondary | ICD-10-CM

## 2014-11-27 LAB — POCT HEMOGLOBIN-HEMACUE: HEMOGLOBIN: 10.2 g/dL — AB (ref 13.0–17.0)

## 2014-11-27 MED ORDER — DARBEPOETIN ALFA 60 MCG/0.3ML IJ SOSY
PREFILLED_SYRINGE | INTRAMUSCULAR | Status: AC
  Administered 2014-11-27: 100 ug via SUBCUTANEOUS
  Filled 2014-11-27: qty 0.3

## 2014-11-27 MED ORDER — DARBEPOETIN ALFA 200 MCG/0.4ML IJ SOSY: 160.0000 ug | PREFILLED_SYRINGE | INTRAMUSCULAR | Status: DC

## 2014-11-27 MED ORDER — DARBEPOETIN ALFA 100 MCG/0.5ML IJ SOSY
PREFILLED_SYRINGE | INTRAMUSCULAR | Status: AC
  Administered 2014-11-27: 60 ug via SUBCUTANEOUS
  Filled 2014-11-27: qty 0.5

## 2014-12-11 ENCOUNTER — Encounter (HOSPITAL_COMMUNITY)
Admission: RE | Admit: 2014-12-11 | Discharge: 2014-12-11 | Disposition: A | Discharge status: Home / Self Care | Payer: Medicare Other | Source: Ambulatory Visit | Attending: Nephrology | Admitting: Nephrology

## 2014-12-11 DIAGNOSIS — N183 Chronic kidney disease, stage 3 unspecified: Secondary | ICD-10-CM

## 2014-12-11 LAB — IRON AND TIBC
Iron: 52 ug/dL (ref 42–165)
Saturation Ratios: 22 % (ref 20–55)
TIBC: 233 ug/dL (ref 215–435)
UIBC: 181 ug/dL (ref 125–400)

## 2014-12-11 LAB — POCT HEMOGLOBIN-HEMACUE: Hemoglobin: 10.1 g/dL — ABNORMAL LOW (ref 13.0–17.0)

## 2014-12-11 LAB — FERRITIN: FERRITIN: 353 ng/mL — AB (ref 22–322)

## 2014-12-11 MED ORDER — DARBEPOETIN ALFA 200 MCG/0.4ML IJ SOSY
160.0000 ug | PREFILLED_SYRINGE | INTRAMUSCULAR | Status: DC
  Administered 2014-12-11: 160 ug via SUBCUTANEOUS

## 2014-12-11 MED ORDER — DARBEPOETIN ALFA 200 MCG/0.4ML IJ SOSY
PREFILLED_SYRINGE | INTRAMUSCULAR | Status: AC
  Administered 2014-12-11: 160 ug via SUBCUTANEOUS
  Filled 2014-12-11: qty 0.4

## 2014-12-17 DIAGNOSIS — I120 Hypertensive chronic kidney disease with stage 5 chronic kidney disease or end stage renal disease: Secondary | ICD-10-CM | POA: Diagnosis not present

## 2014-12-17 DIAGNOSIS — N185 Chronic kidney disease, stage 5: Secondary | ICD-10-CM | POA: Diagnosis not present

## 2014-12-17 DIAGNOSIS — D631 Anemia in chronic kidney disease: Secondary | ICD-10-CM | POA: Diagnosis not present

## 2014-12-17 DIAGNOSIS — N2581 Secondary hyperparathyroidism of renal origin: Secondary | ICD-10-CM | POA: Diagnosis not present

## 2014-12-23 ENCOUNTER — Telehealth: Payer: Self-pay | Admitting: *Deleted

## 2014-12-23 NOTE — Telephone Encounter (Addendum)
Pt had cancelled follow up appts in July 2015.   Spoke with pt today.  Offered pt to reschedule appts with Dr. Burr Medico to reestablish care at the cancer center.  Pt voiced frustration of going to too many doctors and not receiving adequate information.  Pt also stated he could not afford copay with different mds.  Informed pt that he could discuss copay issue here  with our financial counselors.  Reinforced with pt that he needs to maintain continuity of care by keeping appts.   Pt can also inform md of his wishes at office visit.  Pt stated his wife is currently going through treatments now, and he will not reschedule until his wife completes her treatments.  Pt understood to call office back when he is ready to reschedule. Pt also informed nurse that he is not under dialysis treatment at present.   Message to Dr. Burr Medico.

## 2014-12-25 ENCOUNTER — Encounter (HOSPITAL_COMMUNITY)
Admission: RE | Admit: 2014-12-25 | Discharge: 2014-12-25 | Disposition: A | Discharge status: Home / Self Care | Payer: Medicare Other | Source: Ambulatory Visit | Attending: Nephrology | Admitting: Nephrology

## 2014-12-25 DIAGNOSIS — N183 Chronic kidney disease, stage 3 unspecified: Secondary | ICD-10-CM

## 2014-12-25 LAB — POCT HEMOGLOBIN-HEMACUE: HEMOGLOBIN: 10.9 g/dL — AB (ref 13.0–17.0)

## 2014-12-25 MED ORDER — DARBEPOETIN ALFA 200 MCG/0.4ML IJ SOSY
PREFILLED_SYRINGE | INTRAMUSCULAR | Status: AC
  Filled 2014-12-25: qty 0.4

## 2014-12-25 MED ORDER — DARBEPOETIN ALFA 200 MCG/0.4ML IJ SOSY
160.0000 ug | PREFILLED_SYRINGE | INTRAMUSCULAR | Status: DC
  Administered 2014-12-25: 160 ug via SUBCUTANEOUS

## 2015-01-08 ENCOUNTER — Encounter (HOSPITAL_COMMUNITY)
Admission: RE | Admit: 2015-01-08 | Discharge: 2015-01-08 | Disposition: A | Discharge status: Home / Self Care | Payer: Medicare Other | Source: Ambulatory Visit | Attending: Nephrology | Admitting: Nephrology

## 2015-01-08 DIAGNOSIS — N183 Chronic kidney disease, stage 3 unspecified: Secondary | ICD-10-CM

## 2015-01-08 DIAGNOSIS — D631 Anemia in chronic kidney disease: Secondary | ICD-10-CM | POA: Diagnosis not present

## 2015-01-08 DIAGNOSIS — N185 Chronic kidney disease, stage 5: Secondary | ICD-10-CM | POA: Insufficient documentation

## 2015-01-08 LAB — IRON AND TIBC
Iron: 48 ug/dL (ref 42–165)
SATURATION RATIOS: 22 % (ref 20–55)
TIBC: 215 ug/dL (ref 215–435)
UIBC: 167 ug/dL (ref 125–400)

## 2015-01-08 LAB — FERRITIN: FERRITIN: 252 ng/mL (ref 22–322)

## 2015-01-08 MED ORDER — DARBEPOETIN ALFA 60 MCG/0.3ML IJ SOSY
PREFILLED_SYRINGE | INTRAMUSCULAR | Status: AC
  Administered 2015-01-08: 60 ug via SUBCUTANEOUS
  Filled 2015-01-08: qty 0.3

## 2015-01-08 MED ORDER — DARBEPOETIN ALFA 200 MCG/0.4ML IJ SOSY: 160.0000 ug | PREFILLED_SYRINGE | INTRAMUSCULAR | Status: DC

## 2015-01-08 MED ORDER — DARBEPOETIN ALFA 100 MCG/0.5ML IJ SOSY
PREFILLED_SYRINGE | INTRAMUSCULAR | Status: AC
  Administered 2015-01-08: 100 ug via SUBCUTANEOUS
  Filled 2015-01-08: qty 0.5

## 2015-01-09 LAB — POCT HEMOGLOBIN-HEMACUE: Hemoglobin: 10.8 g/dL — ABNORMAL LOW (ref 13.0–17.0)

## 2015-01-22 ENCOUNTER — Encounter (HOSPITAL_COMMUNITY)
Admission: RE | Admit: 2015-01-22 | Discharge: 2015-01-22 | Disposition: A | Discharge status: Home / Self Care | Payer: Medicare Other | Source: Ambulatory Visit | Attending: Nephrology | Admitting: Nephrology

## 2015-01-22 DIAGNOSIS — N183 Chronic kidney disease, stage 3 unspecified: Secondary | ICD-10-CM

## 2015-01-22 LAB — POCT HEMOGLOBIN-HEMACUE: HEMOGLOBIN: 11.3 g/dL — AB (ref 13.0–17.0)

## 2015-01-22 MED ORDER — DARBEPOETIN ALFA 200 MCG/0.4ML IJ SOSY
160.0000 ug | PREFILLED_SYRINGE | INTRAMUSCULAR | Status: DC
  Administered 2015-01-22: 160 ug via SUBCUTANEOUS

## 2015-01-22 MED ORDER — DARBEPOETIN ALFA 200 MCG/0.4ML IJ SOSY
PREFILLED_SYRINGE | INTRAMUSCULAR | Status: AC
  Filled 2015-01-22: qty 0.4

## 2015-01-26 ENCOUNTER — Other Ambulatory Visit (HOSPITAL_COMMUNITY): Payer: Self-pay | Admitting: Cardiology

## 2015-01-26 DIAGNOSIS — I6523 Occlusion and stenosis of bilateral carotid arteries: Secondary | ICD-10-CM

## 2015-02-04 ENCOUNTER — Ambulatory Visit (HOSPITAL_COMMUNITY): Payer: Medicare Other | Attending: Family Medicine | Admitting: *Deleted

## 2015-02-04 DIAGNOSIS — I10 Essential (primary) hypertension: Secondary | ICD-10-CM | POA: Diagnosis not present

## 2015-02-04 DIAGNOSIS — R0989 Other specified symptoms and signs involving the circulatory and respiratory systems: Secondary | ICD-10-CM | POA: Diagnosis not present

## 2015-02-04 DIAGNOSIS — E785 Hyperlipidemia, unspecified: Secondary | ICD-10-CM | POA: Insufficient documentation

## 2015-02-04 DIAGNOSIS — I739 Peripheral vascular disease, unspecified: Secondary | ICD-10-CM | POA: Diagnosis not present

## 2015-02-04 DIAGNOSIS — E119 Type 2 diabetes mellitus without complications: Secondary | ICD-10-CM | POA: Insufficient documentation

## 2015-02-04 DIAGNOSIS — I6523 Occlusion and stenosis of bilateral carotid arteries: Secondary | ICD-10-CM | POA: Diagnosis not present

## 2015-02-04 DIAGNOSIS — Z87891 Personal history of nicotine dependence: Secondary | ICD-10-CM | POA: Insufficient documentation

## 2015-02-04 NOTE — Progress Notes (Signed)
Carotid Duplex Scan Performed 

## 2015-02-05 ENCOUNTER — Encounter (HOSPITAL_COMMUNITY)
Admission: RE | Admit: 2015-02-05 | Discharge: 2015-02-05 | Disposition: A | Discharge status: Home / Self Care | Payer: Medicare Other | Source: Ambulatory Visit | Attending: Nephrology | Admitting: Nephrology

## 2015-02-05 DIAGNOSIS — N183 Chronic kidney disease, stage 3 unspecified: Secondary | ICD-10-CM

## 2015-02-05 LAB — POCT HEMOGLOBIN-HEMACUE: Hemoglobin: 11.4 g/dL — ABNORMAL LOW (ref 13.0–17.0)

## 2015-02-05 MED ORDER — DARBEPOETIN ALFA 200 MCG/0.4ML IJ SOSY
160.0000 ug | PREFILLED_SYRINGE | INTRAMUSCULAR | Status: DC
  Administered 2015-02-05: 160 ug via SUBCUTANEOUS

## 2015-02-06 LAB — IRON AND TIBC
IRON: 50 ug/dL (ref 42–165)
SATURATION RATIOS: 23 % (ref 20–55)
TIBC: 215 ug/dL (ref 215–435)
UIBC: 165 ug/dL (ref 125–400)

## 2015-02-06 LAB — FERRITIN: FERRITIN: 197 ng/mL (ref 22–322)

## 2015-02-19 ENCOUNTER — Encounter (HOSPITAL_COMMUNITY): Admission: RE | Admit: 2015-02-19 | Payer: Medicare Other | Source: Ambulatory Visit

## 2015-02-23 ENCOUNTER — Encounter (HOSPITAL_COMMUNITY): Payer: Self-pay | Admitting: Emergency Medicine

## 2015-02-23 ENCOUNTER — Emergency Department (INDEPENDENT_AMBULATORY_CARE_PROVIDER_SITE_OTHER): Payer: Medicare Other

## 2015-02-23 ENCOUNTER — Emergency Department (HOSPITAL_COMMUNITY)
Admission: EM | Admit: 2015-02-23 | Discharge: 2015-02-23 | Disposition: A | Discharge status: Home / Self Care | Payer: Medicare Other | Source: Home / Self Care | Attending: Family Medicine | Admitting: Family Medicine

## 2015-02-23 DIAGNOSIS — M10341 Gout due to renal impairment, right hand: Secondary | ICD-10-CM | POA: Diagnosis not present

## 2015-02-23 DIAGNOSIS — N186 End stage renal disease: Secondary | ICD-10-CM

## 2015-02-23 DIAGNOSIS — M19041 Primary osteoarthritis, right hand: Secondary | ICD-10-CM | POA: Diagnosis not present

## 2015-02-23 LAB — RENAL FUNCTION PANEL
ANION GAP: 12 (ref 5–15)
Albumin: 3.2 g/dL — ABNORMAL LOW (ref 3.5–5.2)
BUN: 74 mg/dL — AB (ref 6–23)
CO2: 23 mmol/L (ref 19–32)
Calcium: 8.5 mg/dL (ref 8.4–10.5)
Chloride: 105 mmol/L (ref 96–112)
Creatinine, Ser: 7.91 mg/dL — ABNORMAL HIGH (ref 0.50–1.35)
GFR calc non Af Amer: 6 mL/min — ABNORMAL LOW (ref >90)
GFR, EST AFRICAN AMERICAN: 7 mL/min — AB (ref >90)
GLUCOSE: 124 mg/dL — AB (ref 70–99)
Phosphorus: 7.5 mg/dL — ABNORMAL HIGH (ref 2.3–4.6)
Potassium: 4.2 mmol/L (ref 3.5–5.1)
SODIUM: 140 mmol/L (ref 135–145)

## 2015-02-23 LAB — POCT I-STAT, CHEM 8
BUN: 69 mg/dL — ABNORMAL HIGH (ref 6–23)
Calcium, Ion: 1.04 mmol/L — ABNORMAL LOW (ref 1.13–1.30)
Chloride: 106 mmol/L (ref 96–112)
Creatinine, Ser: 7.8 mg/dL — ABNORMAL HIGH (ref 0.50–1.35)
Glucose, Bld: 128 mg/dL — ABNORMAL HIGH (ref 70–99)
HCT: 41 % (ref 39.0–52.0)
Hemoglobin: 13.9 g/dL (ref 13.0–17.0)
Potassium: 4.2 mmol/L (ref 3.5–5.1)
SODIUM: 142 mmol/L (ref 135–145)
TCO2: 20 mmol/L (ref 0–100)

## 2015-02-23 LAB — URIC ACID: Uric Acid, Serum: 9.7 mg/dL — ABNORMAL HIGH (ref 4.0–7.8)

## 2015-02-23 MED ORDER — COLCHICINE 0.6 MG PO TABS: 0.6000 mg | ORAL_TABLET | Freq: Every day | ORAL | Status: DC

## 2015-02-23 MED ORDER — HYDROCODONE-ACETAMINOPHEN 5-325 MG PO TABS: 1.0000 | ORAL_TABLET | Freq: Four times a day (QID) | ORAL | Status: DC | PRN

## 2015-02-23 NOTE — Discharge Instructions (Signed)
Thank you for coming in today. Take colchicine daily to stop the gout attack.  Use Norco for severe pain Follow-up with Dr. Lorrene Reid very soon. Please call her for an appointment today.   Gout Gout is when your joints become red, sore, and swell (inflamed). This is caused by the buildup of uric acid crystals in the joints. Uric acid is a chemical that is normally in the blood. If the level of uric acid gets too high in the blood, these crystals form in your joints and tissues. Over time, these crystals can form into masses near the joints and tissues. These masses can destroy bone and cause the bone to look misshapen (deformed). HOME CARE   Do not take aspirin for pain.  Only take medicine as told by your doctor.  Rest the joint as much as you can. When in bed, keep sheets and blankets off painful areas.  Keep the sore joints raised (elevated).  Put warm or cold packs on painful joints. Use of warm or cold packs depends on which works best for you.  Use crutches if the painful joint is in your leg.  Drink enough fluids to keep your pee (urine) clear or pale yellow. Limit alcohol, sugary drinks, and drinks with fructose in them.  Follow your diet instructions. Pay careful attention to how much protein you eat. Include fruits, vegetables, whole grains, and fat-free or low-fat milk products in your daily diet. Talk to your doctor or dietitian about the use of coffee, vitamin C, and cherries. These may help lower uric acid levels.  Keep a healthy body weight. GET HELP RIGHT AWAY IF:   You have watery poop (diarrhea), throw up (vomit), or have any side effects from medicines.  You do not feel better in 24 hours, or you are getting worse.  Your joint becomes suddenly more tender, and you have chills or a fever. MAKE SURE YOU:   Understand these instructions.  Will watch your condition.  Will get help right away if you are not doing well or get worse. Document Released: 09/06/2008  Document Revised: 04/14/2014 Document Reviewed: 07/11/2012 [facility] Patient Information 2015 [place], [place]. This information is not intended to replace advice given to you by your health care provider. Make sure you discuss any questions you have with your health care provider.    Dialysis Dialysis is a procedure that replaces some of the work healthy kidneys do. It is done when you lose about 85-90% of your kidney function. It may also be done earlier if your symptoms may be improved by dialysis. During dialysis, wastes, salt, and extra water are removed from the blood, and the levels of certain chemicals in the blood (such as potassium) are maintained. Dialysis is done in sessions. Dialysis sessions are continued until the kidneys get better. If the kidneys cannot get better, such as in end-stage kidney disease, dialysis is continued for life or until you receive a new kidney (kidney transplant). There are two types of dialysis: hemodialysis and peritoneal dialysis. WHAT IS HEMODIALYSIS?  Hemodialysis is a type of dialysis in which a machine called a dialyzer is used to filter the blood. Before beginning hemodialysis, you will have surgery to create a site where blood can be removed from the body and returned to the body (vascular access). There are three types of vascular accesses:  Arteriovenous fistula. To create this type of access, an artery is connected to a vein (usually in the arm). A fistula takes 1-6 months to develop  after surgery. If it develops properly, it usually lasts longer than the other types of vascular accesses. It is also less likely to become infected and cause blood clots.  Arteriovenous graft. To create this type of access, an artery and a vein in the arm are connected with a tube. A graft may be used within 2-3 weeks of surgery.  A venous catheter. To create this type of access, a thin, flexible tube (catheter) is placed in a large vein in your neck, chest, or groin. A  catheter may be used right away. It is usually used as a temporary access when dialysis needs to begin immediately. During hemodialysis, blood leaves the body through your access. It travels through a tube to the dialyzer, where it is filtered. The blood then returns to your body through another tube. Hemodialysis is usually performed by a health care provider at a hospital or dialysis center three times a week. Visits last about 3-4 hours. It may also be performed with the help of another person at home with training.  WHAT IS PERITONEAL DIALYSIS? Peritoneal dialysis is a type of dialysis in which the thin lining of the abdomen (peritoneum) is used as a filter. Before beginning peritoneal dialysis, you will have surgery to place a catheter in your abdomen. The catheter will be used to transfer a fluid called dialysate to and from your abdomen. At the start of a session, your abdomen is filled with dialysate. During the session, wastes, salt, and extra water in the blood pass through the peritoneum and into the dialysate. The dialysate is drained from the body at the end of the session. The process of filling and draining the dialysate is called an exchange. Exchanges are repeated until you have used up all the dialysate for the day. Peritoneal dialysis may be performed by you at home or at almost any other location. It is done every day. You may need up to five exchanges a day. The amount of time the dialysate is in your body between exchanges is called a dwell. The dwell depends on the number of exchanges needed and the characteristics of the peritoneum. It usually varies from 1.5-3 hours. You may go about your day normally between exchanges. Alternately, the exchanges may be done at night while you sleep, using a machine called a cycler. WHICH TYPE OF DIALYSIS SHOULD I CHOOSE?  Both hemodialysis and peritoneal dialysis have advantages and disadvantages. Talk to your health care provider about which type of  dialysis would be best for you. Your lifestyle and preferences should be considered along with your medical condition. In some cases, only one type of dialysis may be an option.  Advantages of hemodialysis  It is done less often than peritoneal dialysis.  Someone else can do the dialysis for you.  If you go to a dialysis center, your health care provider will be able to recognize any problems right away.  If you go to a dialysis center, you can interact with others who are having dialysis. This can provide you with emotional support. Disadvantages of hemodialysis  Hemodialysis may cause cramps and low blood pressure. It may leave you feeling tired on the days you have the treatment.  If you go to a dialysis center, you will need to make weekly appointments and work around the center's schedule.  You will need to take extra care when traveling. If you go to a dialysis center, you will need to make special arrangements to visit a dialysis center near  your destination. If you are having treatments at home, you will need to take the dialyzer with you to your destination.  You will need to avoid more foods than you would need to avoid on peritoneal dialysis. Advantages of peritoneal dialysis  It is less likely than hemodialysis to cause cramps and low blood pressure.  You may do exchanges on your own wherever you are, including when you travel.  You do not need to avoid as many foods as you do on hemodialysis. Disadvantages of peritoneal dialysis  It is done more often than hemodialysis.  Performing peritoneal dialysis requires you to have dexterity of the hands. You must also be able to lift bags.  You will have to learn sterilization techniques. You will need to practice them every day to reduce the risk of infection. WHAT CHANGES WILL I NEED TO MAKE TO MY DIET DURING DIALYSIS? Both hemodialysis and peritoneal dialysis require you to make some changes to your diet. For example, you  will need to limit your intake of foods high in the minerals phosphorus and potassium. You will also need to limit your fluid intake. Your dietitian can help you plan meals. A good meal plan can improve your dialysis and your health.  WHAT SHOULD I EXPECT WHEN BEGINNING DIALYSIS? Adjusting to the dialysis treatment, schedule, and diet can take some time. You may need to stop working and may not be able to do some of the things you normally do. You may feel anxious or depressed when beginning dialysis. Eventually, many people feel better overall because of dialysis. Some people are able to return to work after making some changes, such as reducing work intensity. WHERE CAN I FIND MORE INFORMATION?   [place]: www.kidney.org  American Association of Kidney Patients: BombTimer.gl  [employer]: www.kidneyfund.org Document Released: 02/18/2003 Document Revised: 04/14/2014 Document Reviewed: 01/22/2013 [facility] Patient Information 2015 [place], [place]. This information is not intended to replace advice given to you by your health care provider. Make sure you discuss any questions you have with your health care provider.

## 2015-02-23 NOTE — ED Notes (Signed)
Pt states he has been having some joint pain, and his doctor told him it was Gout.  The pain has since moved to his hand and the hand is swollen.

## 2015-02-23 NOTE — ED Provider Notes (Signed)
[name] is a 64 y.o. male who presents to Urgent Care today for right hand swelling and pain starting over the past three days. Patient denies any injury, fever, chills, vomiting or diarrhea. He has not tried any medications yet. He denies a personal history of gout. He notes a history of nearing end stage CKD. He has an AV fistula in his left arm but is not yet receiving dialysis. He feels well otherwise.   Patient has not taken his medications this morning.   No chest pains or palpitations. No lightheadedness dizziness nausea or fatigue.   Past Medical History  Diagnosis Date  . Diabetes mellitus   . Hypertension   . Hyperlipidemia   . Chronic kidney disease   . PVD (peripheral vascular disease)   . Venous insufficiency   . Morbid obesity   . Chronic cystitis   . Arthritis   . UMBILICAL HERNIA [id number]    Qualifier: History of  By: [name] MD, Audelia Acton    . NEPHROLITHIASIS, HX OF 12/02/2009    Qualifier: Diagnosis of  By: [name], Cat    . Lymphedema   . CHF (congestive heart failure)   . Asthma     as a child  . H/O hiatal hernia     states it's been fixed  . Dry skin    Past Surgical History  Procedure Laterality Date  . R knee arthoscopic repair of meniscus    . Umbilical hernia repair    . Amputation      Right and left fifth toes.   . Popliteal to posterior tibial bypass      2006  . Eye surgery Bilateral     cataract and lens implant  . Colonoscopy    . Hernia repair    . Av fistula placement Left 03/21/2014    Procedure: ARTERIOVENOUS (AV) FISTULA CREATION with ultrasound;  Surgeon: [name], MD;  Location: [facility];  Service: Vascular;  Laterality: Left;   History  Substance Use Topics  . Smoking status: Former Smoker    Quit date: 06/06/1979  . Smokeless tobacco: Former Systems developer  . Alcohol Use: No     Comment:   "years ago" , none now   ROS as above Medications: No current facility-administered medications for this encounter.   Current Outpatient  Prescriptions  Medication Sig Dispense Refill  . amLODipine (NORVASC) 10 MG tablet Take 10 mg by mouth every morning.    [name] aspirin EC 81 MG tablet Take 81 mg by mouth every morning.    [name] atorvastatin (LIPITOR) 40 MG tablet Take 1 tablet (40 mg total) by mouth daily at 6 PM. 30 tablet 11  . carvedilol (COREG) 25 MG tablet Take 1 tablet (25 mg total) by mouth 2 (two) times daily with a meal. 60 tablet 11  . furosemide (LASIX) 80 MG tablet Take 80 mg by mouth 2 (two) times daily.    [name] glipiZIDE (GLUCOTROL) 10 MG tablet Take 5-10 mg by mouth daily before breakfast. Takes 1/2-1 tablet depending on sugar. Hold if sugar is less than 98    . hydrALAZINE (APRESOLINE) 100 MG tablet Take 1 tablet (100 mg total) by mouth 2 (two) times daily. take 1 tablet by mouth twice a day 180 tablet 3  . sodium bicarbonate 650 MG tablet Take 650 mg by mouth 3 (three) times daily.    . calcitRIOL (ROCALTROL) 0.25 MCG capsule Take 0.25 mcg by mouth daily.     . colchicine 0.6  MG tablet Take 1 tablet (0.6 mg total) by mouth daily. 14 tablet 0  . darbepoetin (ARANESP) 100 MCG/0.5ML SOLN injection Inject 100 mcg into the skin every 14 (fourteen) days. [facility].    [name] HYDROcodone-acetaminophen (NORCO/VICODIN) 5-325 MG per tablet Take 1 tablet by mouth every 6 (six) hours as needed. 15 tablet 0   Allergies  Allergen Reactions  . Shrimp [Shellfish Allergy] Swelling  . Iodine Swelling     Exam:  BP 207/92 mmHg  Pulse 70  Temp(Src) 97.9 F (36.6 C) (Oral)  Resp 18  SpO2 98% Gen: Well NAD HEENT: EOMI,  MMM Lungs: Normal work of breathing. CTABL Heart: RRR no MRG Abd: NABS, Soft. Nondistended, Nontender Exts: Brisk capillary refill, warm and well perfused. AV fistula left arm.  Right hand: Swollen and tender at the wrist and over MCPs. Pulses capillary refill and sensation intact.  Skin is nonerythematous with no induration or fluctuance.  Results for orders placed or performed during the hospital encounter of  02/23/15 (from the past 24 hour(s))  I-STAT, chem 8     Status: Abnormal   Collection Time: 02/23/15  8:53 AM  Result Value Ref Range   Sodium 142 135 - 145 mmol/L   Potassium 4.2 3.5 - 5.1 mmol/L   Chloride 106 96 - 112 mmol/L   BUN 69 (H) 6 - 23 mg/dL   Creatinine, Ser 7.80 (H) 0.50 - 1.35 mg/dL   Glucose, Bld 128 (H) 70 - 99 mg/dL   Calcium, Ion 1.04 (L) 1.13 - 1.30 mmol/L   TCO2 20 0 - 100 mmol/L   Hemoglobin 13.9 13.0 - 17.0 g/dL   HCT 41.0 39.0 - 52.0 %   Dg Hand Complete Right  02/23/2015   CLINICAL DATA:  Right hand pain and swelling for 3 days with no known injury. Question gout .  EXAM: RIGHT HAND - COMPLETE 3+ VIEW  COMPARISON:  07/08/2010 right finger radiography  FINDINGS: There is soft tissue swelling involving the hand, especially dorsally. No associated fracture, subcutaneous gas, or opaque foreign body.  Diffuse interphalangeal osteoarthritis with prominent spurs, most advanced in the middle finger. Central remodeling with early gull wing morphology noted in the second and fifth PIP joints. Narrowed appearance of the third proximal phalanx head favors juxta-articular erosions stable from 2011. There is also a chronic appearing erosion around the second DIP joint. There is no mineralized soft tissue strongly suggestive of tophus.  Advanced first [place] osteoarthritis with joint narrowing and spurring.  IMPRESSION: 1. Chronic juxta-articular erosions at the second and third interphalangeal joints could be gouty. 2. Advanced interphalangeal osteoarthritis with early erosive features.   Electronically Signed   By: Monte Fantasia M.D.   On: 02/23/2015 08:58    Assessment and Plan: 64 y.o. male with  1) Hand Pain: Likely gout flair. Treat with colchicine and norco. Follow up with nephrology.  Uric acid pending.  2) ESRD: Not uremic or hyperkalemic. Nearing need for HD. Pt has a patent AV fistula in left arm. Plan to f/u with Dr. Lorrene Reid. I spoke with Dr. Lorrene Reid who agrees with the  plan. 3) HTN:  Patient has not taken his blood pressure medications yet this morning. He should return home and take his hydralazine.  Discussed warning signs or symptoms. Please see discharge instructions. Patient expresses understanding.     Gregor Hams, MD 02/23/15 [phone]

## 2015-02-24 DIAGNOSIS — N2581 Secondary hyperparathyroidism of renal origin: Secondary | ICD-10-CM | POA: Diagnosis not present

## 2015-02-24 DIAGNOSIS — I120 Hypertensive chronic kidney disease with stage 5 chronic kidney disease or end stage renal disease: Secondary | ICD-10-CM | POA: Diagnosis not present

## 2015-02-24 DIAGNOSIS — D631 Anemia in chronic kidney disease: Secondary | ICD-10-CM | POA: Diagnosis not present

## 2015-02-24 DIAGNOSIS — N185 Chronic kidney disease, stage 5: Secondary | ICD-10-CM | POA: Diagnosis not present

## 2015-02-26 ENCOUNTER — Other Ambulatory Visit (HOSPITAL_COMMUNITY): Payer: Self-pay | Admitting: *Deleted

## 2015-02-27 ENCOUNTER — Encounter (HOSPITAL_COMMUNITY)
Admission: RE | Admit: 2015-02-27 | Discharge: 2015-02-27 | Disposition: A | Discharge status: Home / Self Care | Payer: Medicare Other | Source: Ambulatory Visit | Attending: Nephrology | Admitting: Nephrology

## 2015-02-27 DIAGNOSIS — N183 Chronic kidney disease, stage 3 unspecified: Secondary | ICD-10-CM

## 2015-02-27 LAB — POCT HEMOGLOBIN-HEMACUE: Hemoglobin: 10.7 g/dL — ABNORMAL LOW (ref 13.0–17.0)

## 2015-02-27 MED ORDER — SODIUM CHLORIDE 0.9 % IV SOLN
510.0000 mg | Freq: Once | INTRAVENOUS | Status: AC
  Administered 2015-02-27: 510 mg via INTRAVENOUS
  Filled 2015-02-27: qty 17

## 2015-02-27 MED ORDER — DARBEPOETIN ALFA 200 MCG/0.4ML IJ SOSY: 160.0000 ug | PREFILLED_SYRINGE | INTRAMUSCULAR | Status: DC

## 2015-02-28 ENCOUNTER — Telehealth (HOSPITAL_COMMUNITY): Payer: Self-pay | Admitting: *Deleted

## 2015-02-28 MED ORDER — DARBEPOETIN ALFA 100 MCG/0.5ML IJ SOSY
PREFILLED_SYRINGE | INTRAMUSCULAR | Status: AC
  Administered 2015-02-27: 100 ug
  Filled 2015-02-28: qty 0.5

## 2015-02-28 MED ORDER — DARBEPOETIN ALFA 60 MCG/0.3ML IJ SOSY
PREFILLED_SYRINGE | INTRAMUSCULAR | Status: AC
  Administered 2015-02-27: 60 ug
  Filled 2015-02-28: qty 0.3

## 2015-02-28 NOTE — ED Notes (Signed)
Lab results faxed to Dr. Sanda Klein office and confirmation received. 02/28/2015

## 2015-02-28 NOTE — ED Notes (Addendum)
Uric Acid 9.7 H.  3/16 Message sent to Dr. Georgina Snell. 3/18 He wrote to call pt. results and fax them to Dr. Lorrene Reid.  I called pt.  and left a message to call. Call 1.  Lab faxed to Dr. Sanda Klein office.   02/28/2015 Left message.  Call 2. 03/06/2015 Left message.  Call 3. 03/09/2015 Unable to reach pt. by phone x 3.  Confidential marked letter sent with result and instructions to follow up with Dr. Lorrene Reid. 03/13/2015

## 2015-03-02 MED FILL — Darbepoetin Alfa Soln Prefilled Syringe 100 MCG/0.5ML: INTRAMUSCULAR | Qty: 0.5 | Status: AC

## 2015-03-19 ENCOUNTER — Encounter (HOSPITAL_COMMUNITY)
Admission: RE | Admit: 2015-03-19 | Discharge: 2015-03-19 | Disposition: A | Discharge status: Home / Self Care | Payer: Medicare Other | Source: Ambulatory Visit | Attending: Nephrology | Admitting: Nephrology

## 2015-03-19 DIAGNOSIS — N183 Chronic kidney disease, stage 3 unspecified: Secondary | ICD-10-CM

## 2015-03-19 LAB — IRON AND TIBC
IRON: 82 ug/dL (ref 42–165)
SATURATION RATIOS: 36 % (ref 20–55)
TIBC: 227 ug/dL (ref 215–435)
UIBC: 145 ug/dL (ref 125–400)

## 2015-03-19 LAB — POCT HEMOGLOBIN-HEMACUE: HEMOGLOBIN: 11.2 g/dL — AB (ref 13.0–17.0)

## 2015-03-19 LAB — FERRITIN: Ferritin: 367 ng/mL — ABNORMAL HIGH (ref 22–322)

## 2015-03-19 MED ORDER — DARBEPOETIN ALFA 100 MCG/0.5ML IJ SOSY
PREFILLED_SYRINGE | INTRAMUSCULAR | Status: AC
Start: 2015-03-19 — End: 2015-03-19
  Filled 2015-03-19: qty 0.5

## 2015-03-19 MED ORDER — DARBEPOETIN ALFA 60 MCG/0.3ML IJ SOSY
PREFILLED_SYRINGE | INTRAMUSCULAR | Status: AC
  Administered 2015-03-19: 160 ug via SUBCUTANEOUS
  Filled 2015-03-19: qty 0.3

## 2015-03-19 MED ORDER — DARBEPOETIN ALFA 200 MCG/0.4ML IJ SOSY: 160.0000 ug | PREFILLED_SYRINGE | INTRAMUSCULAR | Status: DC

## 2015-04-02 ENCOUNTER — Encounter (HOSPITAL_COMMUNITY)
Admission: RE | Admit: 2015-04-02 | Discharge: 2015-04-02 | Disposition: A | Discharge status: Home / Self Care | Payer: Medicare Other | Source: Ambulatory Visit | Attending: Nephrology | Admitting: Nephrology

## 2015-04-02 DIAGNOSIS — N183 Chronic kidney disease, stage 3 unspecified: Secondary | ICD-10-CM

## 2015-04-02 LAB — POCT HEMOGLOBIN-HEMACUE: Hemoglobin: 10.6 g/dL — ABNORMAL LOW (ref 13.0–17.0)

## 2015-04-02 MED ORDER — DARBEPOETIN ALFA 200 MCG/0.4ML IJ SOSY
160.0000 ug | PREFILLED_SYRINGE | INTRAMUSCULAR | Status: DC
Start: 2015-04-02 — End: 2015-04-03

## 2015-04-02 MED ORDER — DARBEPOETIN ALFA 100 MCG/0.5ML IJ SOSY
PREFILLED_SYRINGE | INTRAMUSCULAR | Status: AC
  Administered 2015-04-02: 100 ug
  Filled 2015-04-02: qty 0.5

## 2015-04-02 MED ORDER — DARBEPOETIN ALFA 60 MCG/0.3ML IJ SOSY
PREFILLED_SYRINGE | INTRAMUSCULAR | Status: AC
  Administered 2015-04-02: 60 ug
  Filled 2015-04-02: qty 0.3

## 2015-04-04 ENCOUNTER — Encounter (HOSPITAL_COMMUNITY): Payer: Self-pay | Admitting: *Deleted

## 2015-04-04 ENCOUNTER — Encounter (HOSPITAL_COMMUNITY): Payer: Self-pay

## 2015-04-04 ENCOUNTER — Emergency Department (HOSPITAL_COMMUNITY): Payer: Medicare Other

## 2015-04-04 ENCOUNTER — Observation Stay (HOSPITAL_COMMUNITY)
Admission: EM | Admit: 2015-04-04 | Discharge: 2015-04-06 | Disposition: A | Discharge status: Home / Self Care | Payer: Medicare Other | Attending: Internal Medicine | Admitting: Internal Medicine

## 2015-04-04 ENCOUNTER — Emergency Department (INDEPENDENT_AMBULATORY_CARE_PROVIDER_SITE_OTHER)
Admission: EM | Admit: 2015-04-04 | Discharge: 2015-04-04 | Disposition: A | Discharge status: Other Acute Inpatient Hospital | Payer: Medicare Other | Source: Home / Self Care | Attending: Family Medicine | Admitting: Family Medicine

## 2015-04-04 DIAGNOSIS — R2240 Localized swelling, mass and lump, unspecified lower limb: Secondary | ICD-10-CM | POA: Diagnosis present

## 2015-04-04 DIAGNOSIS — I890 Lymphedema, not elsewhere classified: Secondary | ICD-10-CM

## 2015-04-04 DIAGNOSIS — N186 End stage renal disease: Secondary | ICD-10-CM | POA: Insufficient documentation

## 2015-04-04 DIAGNOSIS — R609 Edema, unspecified: Secondary | ICD-10-CM | POA: Diagnosis not present

## 2015-04-04 DIAGNOSIS — N189 Chronic kidney disease, unspecified: Secondary | ICD-10-CM

## 2015-04-04 DIAGNOSIS — E785 Hyperlipidemia, unspecified: Secondary | ICD-10-CM | POA: Diagnosis not present

## 2015-04-04 DIAGNOSIS — L97819 Non-pressure chronic ulcer of other part of right lower leg with unspecified severity: Secondary | ICD-10-CM | POA: Diagnosis not present

## 2015-04-04 DIAGNOSIS — I8311 Varicose veins of right lower extremity with inflammation: Secondary | ICD-10-CM | POA: Diagnosis not present

## 2015-04-04 DIAGNOSIS — L97929 Non-pressure chronic ulcer of unspecified part of left lower leg with unspecified severity: Secondary | ICD-10-CM | POA: Diagnosis not present

## 2015-04-04 DIAGNOSIS — Z89431 Acquired absence of right foot: Secondary | ICD-10-CM | POA: Diagnosis not present

## 2015-04-04 DIAGNOSIS — I129 Hypertensive chronic kidney disease with stage 1 through stage 4 chronic kidney disease, or unspecified chronic kidney disease: Secondary | ICD-10-CM | POA: Diagnosis not present

## 2015-04-04 DIAGNOSIS — N185 Chronic kidney disease, stage 5: Secondary | ICD-10-CM | POA: Diagnosis not present

## 2015-04-04 DIAGNOSIS — Z7982 Long term (current) use of aspirin: Secondary | ICD-10-CM | POA: Diagnosis not present

## 2015-04-04 DIAGNOSIS — I509 Heart failure, unspecified: Secondary | ICD-10-CM | POA: Insufficient documentation

## 2015-04-04 DIAGNOSIS — D472 Monoclonal gammopathy: Secondary | ICD-10-CM | POA: Diagnosis not present

## 2015-04-04 DIAGNOSIS — I120 Hypertensive chronic kidney disease with stage 5 chronic kidney disease or end stage renal disease: Secondary | ICD-10-CM | POA: Insufficient documentation

## 2015-04-04 DIAGNOSIS — Z87891 Personal history of nicotine dependence: Secondary | ICD-10-CM | POA: Insufficient documentation

## 2015-04-04 DIAGNOSIS — I8312 Varicose veins of left lower extremity with inflammation: Secondary | ICD-10-CM

## 2015-04-04 DIAGNOSIS — M1990 Unspecified osteoarthritis, unspecified site: Secondary | ICD-10-CM | POA: Insufficient documentation

## 2015-04-04 DIAGNOSIS — Z87442 Personal history of urinary calculi: Secondary | ICD-10-CM | POA: Insufficient documentation

## 2015-04-04 DIAGNOSIS — R600 Localized edema: Secondary | ICD-10-CM

## 2015-04-04 DIAGNOSIS — L03119 Cellulitis of unspecified part of limb: Secondary | ICD-10-CM

## 2015-04-04 DIAGNOSIS — Z79899 Other long term (current) drug therapy: Secondary | ICD-10-CM | POA: Diagnosis not present

## 2015-04-04 DIAGNOSIS — Z8719 Personal history of other diseases of the digestive system: Secondary | ICD-10-CM | POA: Diagnosis not present

## 2015-04-04 DIAGNOSIS — Q103 Other congenital malformations of eyelid: Secondary | ICD-10-CM

## 2015-04-04 DIAGNOSIS — M7732 Calcaneal spur, left foot: Secondary | ICD-10-CM | POA: Diagnosis not present

## 2015-04-04 DIAGNOSIS — M19072 Primary osteoarthritis, left ankle and foot: Secondary | ICD-10-CM | POA: Diagnosis not present

## 2015-04-04 DIAGNOSIS — J45909 Unspecified asthma, uncomplicated: Secondary | ICD-10-CM | POA: Insufficient documentation

## 2015-04-04 DIAGNOSIS — E1129 Type 2 diabetes mellitus with other diabetic kidney complication: Secondary | ICD-10-CM

## 2015-04-04 DIAGNOSIS — J849 Interstitial pulmonary disease, unspecified: Secondary | ICD-10-CM | POA: Diagnosis not present

## 2015-04-04 DIAGNOSIS — N289 Disorder of kidney and ureter, unspecified: Secondary | ICD-10-CM

## 2015-04-04 DIAGNOSIS — N179 Acute kidney failure, unspecified: Secondary | ICD-10-CM | POA: Diagnosis not present

## 2015-04-04 DIAGNOSIS — Z992 Dependence on renal dialysis: Secondary | ICD-10-CM | POA: Insufficient documentation

## 2015-04-04 DIAGNOSIS — E1121 Type 2 diabetes mellitus with diabetic nephropathy: Secondary | ICD-10-CM

## 2015-04-04 DIAGNOSIS — M1712 Unilateral primary osteoarthritis, left knee: Secondary | ICD-10-CM | POA: Diagnosis not present

## 2015-04-04 DIAGNOSIS — L02419 Cutaneous abscess of limb, unspecified: Secondary | ICD-10-CM

## 2015-04-04 DIAGNOSIS — E119 Type 2 diabetes mellitus without complications: Secondary | ICD-10-CM | POA: Diagnosis not present

## 2015-04-04 DIAGNOSIS — R7989 Other specified abnormal findings of blood chemistry: Secondary | ICD-10-CM | POA: Diagnosis not present

## 2015-04-04 DIAGNOSIS — I872 Venous insufficiency (chronic) (peripheral): Secondary | ICD-10-CM

## 2015-04-04 LAB — BASIC METABOLIC PANEL
ANION GAP: 14 (ref 5–15)
BUN: 101 mg/dL — ABNORMAL HIGH (ref 6–23)
CALCIUM: 7.8 mg/dL — AB (ref 8.4–10.5)
CO2: 18 mmol/L — ABNORMAL LOW (ref 19–32)
CREATININE: 9.22 mg/dL — AB (ref 0.50–1.35)
Chloride: 105 mmol/L (ref 96–112)
GFR calc Af Amer: 6 mL/min — ABNORMAL LOW (ref >90)
GFR calc non Af Amer: 5 mL/min — ABNORMAL LOW (ref >90)
GLUCOSE: 123 mg/dL — AB (ref 70–99)
Potassium: 4.5 mmol/L (ref 3.5–5.1)
SODIUM: 137 mmol/L (ref 135–145)

## 2015-04-04 LAB — CBC WITH DIFFERENTIAL/PLATELET
BASOS ABS: 0 10*3/uL (ref 0.0–0.1)
Basophils Relative: 0 % (ref 0–1)
EOS PCT: 5 % (ref 0–5)
Eosinophils Absolute: 0.6 10*3/uL (ref 0.0–0.7)
HCT: 30.8 % — ABNORMAL LOW (ref 39.0–52.0)
Hemoglobin: 10 g/dL — ABNORMAL LOW (ref 13.0–17.0)
LYMPHS PCT: 14 % (ref 12–46)
Lymphs Abs: 1.5 10*3/uL (ref 0.7–4.0)
MCH: 26.7 pg (ref 26.0–34.0)
MCHC: 32.5 g/dL (ref 30.0–36.0)
MCV: 82.4 fL (ref 78.0–100.0)
Monocytes Absolute: 1.1 10*3/uL — ABNORMAL HIGH (ref 0.1–1.0)
Monocytes Relative: 10 % (ref 3–12)
NEUTROS ABS: 7.9 10*3/uL — AB (ref 1.7–7.7)
Neutrophils Relative %: 71 % (ref 43–77)
PLATELETS: 149 10*3/uL — AB (ref 150–400)
RBC: 3.74 MIL/uL — ABNORMAL LOW (ref 4.22–5.81)
RDW: 17.3 % — AB (ref 11.5–15.5)
WBC: 11.1 10*3/uL — AB (ref 4.0–10.5)

## 2015-04-04 LAB — GLUCOSE, CAPILLARY
Glucose-Capillary: 145 mg/dL — ABNORMAL HIGH (ref 70–99)
Glucose-Capillary: 166 mg/dL — ABNORMAL HIGH (ref 70–99)

## 2015-04-04 LAB — BRAIN NATRIURETIC PEPTIDE: B Natriuretic Peptide: 316.8 pg/mL — ABNORMAL HIGH (ref 0.0–100.0)

## 2015-04-04 LAB — SEDIMENTATION RATE: Sed Rate: 70 mm/hr — ABNORMAL HIGH (ref 0–16)

## 2015-04-04 LAB — CK: CK TOTAL: 163 U/L (ref 7–232)

## 2015-04-04 MED ORDER — INSULIN ASPART 100 UNIT/ML ~~LOC~~ SOLN
0.0000 [IU] | Freq: Three times a day (TID) | SUBCUTANEOUS | Status: DC
  Administered 2015-04-04 – 2015-04-05 (×2): 1 [IU] via SUBCUTANEOUS
  Administered 2015-04-05 – 2015-04-06 (×2): 2 [IU] via SUBCUTANEOUS

## 2015-04-04 MED ORDER — OXYCODONE-ACETAMINOPHEN 5-325 MG PO TABS
1.0000 | ORAL_TABLET | Freq: Once | ORAL | Status: AC
  Administered 2015-04-04: 1 via ORAL
  Filled 2015-04-04: qty 1

## 2015-04-04 MED ORDER — ACETAMINOPHEN 325 MG PO TABS
650.0000 mg | ORAL_TABLET | Freq: Four times a day (QID) | ORAL | Status: DC | PRN
  Administered 2015-04-05 (×2): 650 mg via ORAL
  Filled 2015-04-04 (×2): qty 2

## 2015-04-04 MED ORDER — CALCITRIOL 0.25 MCG PO CAPS
0.2500 ug | ORAL_CAPSULE | Freq: Every day | ORAL | Status: DC
  Administered 2015-04-04 – 2015-04-06 (×3): 0.25 ug via ORAL
  Filled 2015-04-04 (×3): qty 1

## 2015-04-04 MED ORDER — ACETAMINOPHEN 650 MG RE SUPP: 650.0000 mg | Freq: Four times a day (QID) | RECTAL | Status: DC | PRN

## 2015-04-04 MED ORDER — HEPARIN SODIUM (PORCINE) 5000 UNIT/ML IJ SOLN
5000.0000 [IU] | Freq: Three times a day (TID) | INTRAMUSCULAR | Status: DC
  Administered 2015-04-04 – 2015-04-06 (×6): 5000 [IU] via SUBCUTANEOUS
  Filled 2015-04-04 (×7): qty 1

## 2015-04-04 MED ORDER — FUROSEMIDE 80 MG PO TABS
80.0000 mg | ORAL_TABLET | Freq: Two times a day (BID) | ORAL | Status: DC
  Administered 2015-04-04 – 2015-04-06 (×4): 80 mg via ORAL
  Filled 2015-04-04 (×6): qty 1

## 2015-04-04 MED ORDER — ASPIRIN EC 81 MG PO TBEC
81.0000 mg | DELAYED_RELEASE_TABLET | Freq: Every morning | ORAL | Status: DC
  Administered 2015-04-05 – 2015-04-06 (×2): 81 mg via ORAL
  Filled 2015-04-04 (×2): qty 1

## 2015-04-04 MED ORDER — AMLODIPINE BESYLATE 10 MG PO TABS
10.0000 mg | ORAL_TABLET | Freq: Every morning | ORAL | Status: DC
  Administered 2015-04-05 – 2015-04-06 (×2): 10 mg via ORAL
  Filled 2015-04-04 (×2): qty 1

## 2015-04-04 MED ORDER — INSULIN ASPART 100 UNIT/ML ~~LOC~~ SOLN: 0.0000 [IU] | Freq: Every day | SUBCUTANEOUS | Status: DC

## 2015-04-04 MED ORDER — ONDANSETRON HCL 4 MG/2ML IJ SOLN
4.0000 mg | Freq: Four times a day (QID) | INTRAMUSCULAR | Status: DC | PRN
  Filled 2015-04-04: qty 2

## 2015-04-04 MED ORDER — CARVEDILOL 25 MG PO TABS
25.0000 mg | ORAL_TABLET | Freq: Two times a day (BID) | ORAL | Status: DC
  Administered 2015-04-04 – 2015-04-06 (×4): 25 mg via ORAL
  Filled 2015-04-04 (×6): qty 1

## 2015-04-04 MED ORDER — ONDANSETRON 4 MG PO TBDP
4.0000 mg | ORAL_TABLET | Freq: Once | ORAL | Status: AC
  Administered 2015-04-04: 4 mg via ORAL
  Filled 2015-04-04: qty 1

## 2015-04-04 MED ORDER — SODIUM CHLORIDE 0.9 % IJ SOLN
3.0000 mL | Freq: Two times a day (BID) | INTRAMUSCULAR | Status: DC
  Administered 2015-04-04 – 2015-04-05 (×3): 3 mL via INTRAVENOUS

## 2015-04-04 MED ORDER — SODIUM BICARBONATE 650 MG PO TABS
650.0000 mg | ORAL_TABLET | Freq: Three times a day (TID) | ORAL | Status: DC
  Administered 2015-04-04 – 2015-04-06 (×5): 650 mg via ORAL
  Filled 2015-04-04 (×8): qty 1

## 2015-04-04 MED ORDER — HYDRALAZINE HCL 50 MG PO TABS
100.0000 mg | ORAL_TABLET | Freq: Two times a day (BID) | ORAL | Status: DC
  Administered 2015-04-04 – 2015-04-06 (×4): 100 mg via ORAL
  Filled 2015-04-04 (×5): qty 2

## 2015-04-04 MED ORDER — ONDANSETRON HCL 4 MG PO TABS: 4.0000 mg | ORAL_TABLET | Freq: Four times a day (QID) | ORAL | Status: DC | PRN

## 2015-04-04 NOTE — ED Provider Notes (Signed)
CSN: [id number]     Arrival date & time 04/04/15  1010 History   First MD Initiated Contact with Patient 04/04/15 1054     Chief Complaint  Patient presents with  . Leg Swelling   (Consider location/radiation/quality/duration/timing/severity/associated sxs/prior Treatment) HPI Comments: [name] is a 64 yo AA male with a pertinent medical history of venous stasis who presents with malodorous drainage to the lower extremities. Per records he has a history of non-compliance. He cannot see wound care until mid May and requests una boots.   The history is provided by the patient.    Past Medical History  Diagnosis Date  . Diabetes mellitus   . Hypertension   . Hyperlipidemia   . Chronic kidney disease   . PVD (peripheral vascular disease)   . Venous insufficiency   . Morbid obesity   . Chronic cystitis   . Arthritis   . UMBILICAL HERNIA [id number]    Qualifier: History of  By: [name] MD, Audelia Acton    . NEPHROLITHIASIS, HX OF 12/02/2009    Qualifier: Diagnosis of  By: [name], Cat    . Lymphedema   . CHF (congestive heart failure)   . Asthma     as a child  . H/O hiatal hernia     states it's been fixed  . Dry skin    Past Surgical History  Procedure Laterality Date  . R knee arthoscopic repair of meniscus    . Umbilical hernia repair    . Amputation      Right and left fifth toes.   . Popliteal to posterior tibial bypass      2006  . Eye surgery Bilateral     cataract and lens implant  . Colonoscopy    . Hernia repair    . Av fistula placement Left 03/21/2014    Procedure: ARTERIOVENOUS (AV) FISTULA CREATION with ultrasound;  Surgeon: [name], MD;  Location: [facility] OR;  Service: Vascular;  Laterality: Left;   Family History  Problem Relation Age of Onset  . Diabetes Mother   . Heart disease Mother   . Diabetes Brother    History  Substance Use Topics  . Smoking status: Former Smoker    Quit date: 06/06/1979  . Smokeless tobacco: Former Systems developer  . Alcohol Use: No   Comment:   "years ago" , none now    Review of Systems  Constitutional: Negative for fever and chills.    Allergies  Shrimp and Iodine  Home Medications   Prior to Admission medications   Medication Sig Start Date End Date Taking? Authorizing Provider  amLODipine (NORVASC) 10 MG tablet Take 10 mg by mouth every morning.    Historical Provider, MD  aspirin EC 81 MG tablet Take 81 mg by mouth every morning.    Historical Provider, MD  atorvastatin (LIPITOR) 40 MG tablet Take 1 tablet (40 mg total) by mouth daily at 6 PM. 03/11/14   Frazier Richards, MD  calcitRIOL (ROCALTROL) 0.25 MCG capsule Take 0.25 mcg by mouth daily.  04/24/14   Historical Provider, MD  carvedilol (COREG) 25 MG tablet Take 1 tablet (25 mg total) by mouth 2 (two) times daily with a meal. 03/11/14   Frazier Richards, MD  colchicine 0.6 MG tablet Take 1 tablet (0.6 mg total) by mouth daily. 02/23/15   Gregor Hams, MD  darbepoetin (ARANESP) 100 MCG/0.5ML SOLN injection Inject 100 mcg into the skin every 14 (fourteen) days. [facility].  Historical Provider, MD  furosemide (LASIX) 80 MG tablet Take 80 mg by mouth 2 (two) times daily.    Historical Provider, MD  glipiZIDE (GLUCOTROL) 10 MG tablet Take 5-10 mg by mouth daily before breakfast. Takes 1/2-1 tablet depending on sugar. Hold if sugar is less than 98    Historical Provider, MD  hydrALAZINE (APRESOLINE) 100 MG tablet Take 1 tablet (100 mg total) by mouth 2 (two) times daily. take 1 tablet by mouth twice a day 01/20/14   Waldemar Dickens, MD  HYDROcodone-acetaminophen (NORCO/VICODIN) 5-325 MG per tablet Take 1 tablet by mouth every 6 (six) hours as needed. 02/23/15   Gregor Hams, MD  sodium bicarbonate 650 MG tablet Take 650 mg by mouth 3 (three) times daily.    Historical Provider, MD   BP 156/61 mmHg  Pulse 63  Temp(Src) 99 F (37.2 C) (Oral)  Resp 16  SpO2 100% Physical Exam  Constitutional: He appears well-developed and well-nourished. No distress.  Skin: Skin is  warm.  Stockings are covered in green dried drainage, could not remove the left stocking because of tears in the skin. Malodorous  Psychiatric: His behavior is normal.  Nursing note and vitals reviewed.   ED Course  Procedures (including critical care time) Labs Review Labs Reviewed - No data to display  Imaging Review No results found.   MDM   1. Chronic venous stasis dermatitis of both lower extremities   2. Cellulitis and abscess of leg    Stocking adhered to skin on the left with severe dead tissue and green drainage and probable infections. Will send to the ER for possible wound/surgical consultation.     Patrick Pippin, PA-C 04/04/15 1116

## 2015-04-04 NOTE — Consult Note (Signed)
Renal Service Consult Note [facility]  [name] 04/04/2015 [name] Requesting Physician:  Dr Tat  Reason for Consult:  CKD pt w worsening renal function HPI: The patient is a 64 y.o. year-old with hx of HTN, HL, venous insufficiency and progressive CKD. He has been f/b Dr Lorrene Reid and was admitted March 2015 w creat 6, no uremic symptoms and had an AVF placed at that time. Has chronic orthopnea, no worsening leg edema. Came today for painful legs with skin cracking and peeling, L> R.  In ED stockings were covered in green dried drainage w malodor. Pt was seen at urgent care today for chronic venous stasis dermatitis w acute cellulitis. Was sent to ED for admission. Creat is 9, BUN 100.  Admitted by medicine service, no abx for now.    Patient denies loss of energy, appetite or severe fatigue.  No n/v/diarrhea or abd pain.  Denies increase in leg swelling of SOB/ DOE. Has chronic orthopnea.    ROS  denies nsaids  no confusion  no paralysis  Past Medical History  Past Medical History  Diagnosis Date  . Diabetes mellitus   . Hypertension   . Hyperlipidemia   . Chronic kidney disease   . PVD (peripheral vascular disease)   . Venous insufficiency   . Morbid obesity   . Chronic cystitis   . Arthritis   . UMBILICAL HERNIA [id number]    Qualifier: History of  By: [name] MD, Audelia Acton    . NEPHROLITHIASIS, HX OF 12/02/2009    Qualifier: Diagnosis of  By: [name], Cat    . Lymphedema   . CHF (congestive heart failure)   . Asthma     as a child  . H/O hiatal hernia     states it's been fixed  . Dry skin    Past Surgical History  Past Surgical History  Procedure Laterality Date  . R knee arthoscopic repair of meniscus    . Umbilical hernia repair    . Amputation      Right and left fifth toes.   . Popliteal to posterior tibial bypass      2006  . Eye surgery Bilateral     cataract and lens implant  . Colonoscopy    . Hernia repair    . Av fistula  placement Left 03/21/2014    Procedure: ARTERIOVENOUS (AV) FISTULA CREATION with ultrasound;  Surgeon: [name], MD;  Location: [facility] OR;  Service: Vascular;  Laterality: Left;   Family History  Family History  Problem Relation Age of Onset  . Diabetes Mother   . Heart disease Mother   . Diabetes Brother    Social History  reports that he quit smoking about 35 years ago. He has quit using smokeless tobacco. He reports that he does not drink alcohol or use illicit drugs. Allergies  Allergies  Allergen Reactions  . Shrimp [Shellfish Allergy] Swelling  . Iodine Swelling   Home medications Prior to Admission medications   Medication Sig Start Date End Date Taking? Authorizing Provider  amLODipine (NORVASC) 10 MG tablet Take 10 mg by mouth every morning.   Yes Historical Provider, MD  aspirin EC 81 MG tablet Take 81 mg by mouth every morning.   Yes Historical Provider, MD  atorvastatin (LIPITOR) 40 MG tablet Take 1 tablet (40 mg total) by mouth daily at 6 PM. 03/11/14  Yes Frazier Richards, MD  calcitRIOL (ROCALTROL) 0.25 MCG capsule Take 0.25 mcg by mouth daily.  04/24/14  Yes Historical Provider, MD  carvedilol (COREG) 25 MG tablet Take 1 tablet (25 mg total) by mouth 2 (two) times daily with a meal. 03/11/14  Yes Frazier Richards, MD  colchicine 0.6 MG tablet Take 1 tablet (0.6 mg total) by mouth daily. 02/23/15  Yes Gregor Hams, MD  darbepoetin (ARANESP) 100 MCG/0.5ML SOLN injection Inject 100 mcg into the skin every 14 (fourteen) days. [facility].   Yes Historical Provider, MD  furosemide (LASIX) 80 MG tablet Take 80 mg by mouth 2 (two) times daily.   Yes Historical Provider, MD  glipiZIDE (GLUCOTROL) 10 MG tablet Take 5-10 mg by mouth daily before breakfast. Takes 1/2-1 tablet depending on sugar. Hold if sugar is less than 98   Yes Historical Provider, MD  hydrALAZINE (APRESOLINE) 100 MG tablet Take 1 tablet (100 mg total) by mouth 2 (two) times daily. take 1 tablet by mouth twice a day  01/20/14  Yes Waldemar Dickens, MD  ondansetron (ZOFRAN-ODT) 4 MG disintegrating tablet Take 4 mg by mouth every 8 (eight) hours as needed for nausea or vomiting.   Yes Historical Provider, MD  sodium bicarbonate 650 MG tablet Take 650 mg by mouth 3 (three) times daily.   Yes Historical Provider, MD  HYDROcodone-acetaminophen (NORCO/VICODIN) 5-325 MG per tablet Take 1 tablet by mouth every 6 (six) hours as needed. Patient not taking: Reported on 04/04/2015 02/23/15   Gregor Hams, MD   Liver Function Tests No results for input(s): AST, ALT, ALKPHOS, BILITOT, PROT, ALBUMIN in the last 168 hours. No results for input(s): LIPASE, AMYLASE in the last 168 hours. CBC  Recent Labs Lab 04/02/15 0925 04/04/15 1216  WBC  --  11.1*  NEUTROABS  --  7.9*  HGB 10.6* 10.0*  HCT  --  30.8*  MCV  --  82.4  PLT  --  123456*   Basic Metabolic Panel  Recent Labs Lab 04/04/15 1216  NA 137  K 4.5  CL 105  CO2 18*  GLUCOSE 123*  BUN 101*  CREATININE 9.22*  CALCIUM 7.8*    Filed Vitals:   04/04/15 1600 04/04/15 1636 04/04/15 1645 04/04/15 1730  BP: 147/57 172/64 165/73 165/66  Pulse: 63 64 64 67  Temp:    98.5 F (36.9 C)  TempSrc:    Oral  Resp: 19 20 19 20   Height:    5\' 11"  (1.803 m)  Weight:    123.333 kg (271 lb 14.4 oz)  SpO2: 97% 98% 98% 98%   Exam Alert, obese AAF, no distress No rash, cyanosis or gangrene Sclera anicteric, throat clear No JVD or bruits Chest clear bilat , no rales or wheezing RRR 2/6 holosyst M at apex, no RG Abd obese, soft, NTND, no abd wall edema GU normal male LE's 2+ edema lower legs R> L, 1+ R thigh edema Chronic brawny skin changes lower legs w skin cracking, peeling, tenderness L > R Neuro is alert, Ox 3  Last ECHO 12/14 normal LV/ RV CXR mild IS opacities , no gross edema   Assessment: 1. Advanced CKD stage V - no gross uremic symptoms. Azotemia worsening. Will watch while here for the next 24-48 hr see how is doing .  Possible early edema vs  chronic xray IS changes.  Not grossly overloaded on exam except for LE edema, not sure baseline. May need dialysis. Will follow.   2. Chronic LE venous insufficiency/ stasis dermatitis - no wbc, fever. No abx for now per  primary.  3. Morbid obesity 4. HTN 5. DM not on insulin   Plan- will follow.   [name] MD (pgr) [phone]    ([phone] 04/04/2015, 6:30 PM

## 2015-04-04 NOTE — H&P (Signed)
History and Physical  [name] [id number] DOB: 1951/04/05 DOA: 04/04/2015   PCP: Tawanna Sat, MD   Chief Complaint: worsen leg edema  HPI:  64 year old male with a history of CKD stage V, MGUS, diastolic CHF, and diabetes mellitus presents with 2 week history of progressive lower extremity edema, left greater than right. The patient states that he used to have Unna boots or his lower extremities, but this was last done approximately one year ago. He contacted the wound care center at [facility], but he could not get in to see them until sometime in May 2016. The patient states that he has not seen a primary care physician for nearly 1 year, but he claims that his nephrologist Dr. Jamal Maes has been prescribing his antihypertensive medications as well as his furosemide. It is unclear how he is obtaining his opioids and diabetic medications. Apparently, the patient states that he has been seeing Dr. Lorrene Reid every 6 weeks. He has been advised to consider starting dialysis, but he appears to be somewhat resistant. The patient had a left upper extremity AV fistula placed on 03/21/2014.  Over the past 2 weeks he has noted increasing swelling and clear fluid drainage from his bilateral lower extremities. He denies any fevers, chills, chest discomfort, shortness of breath, nausea, vomiting, diarrhea, abdominal pain, dysuria, hematuria. The patient states that he is able to ambulate without pain in his legs. In the emergency department, the patient was afebrile and hemodynamically stable. In fact he was hypertensive with blood pressure of 177/70. Oxygen saturation was 98-100 percent on room air. Labs were significant for serum creatinine 9.22 . WBC was 11.1, hemoglobin 10.0, platelets 149,000. Assessment/Plan: Acute on chronic renal failure (CKD5) -consulted nephrology--spoke with Dr. Melvia Heaps -Suspect that the patient may need to have initiation of dialysis -keep pt on his home dose of  furosemide for now, but if HD is initiated, this can be discontinued -Continue calcitriol and bicarbonate -He appears to be clinically fluid overloaded -If dialysis is not initiated, he may need to be started on intravenous furosemide -Echo Lower extremity lymphedema -In part due to the patient's worsening renal function -Consult wound care nurse--may need Unna boots -will not start abx at this time--do not appear to be infected -ESR, CRP -CK -Venous duplex r/o DVT -echo Diabetes mellitus type 2 with renal complications -Hemoglobin A1c -NovoLog sliding scale Hyperlipidemia -Continue Lipitor if CK is ok Hypertension -Continue amlodipine, carvedilol, hydralazine Thrombocytopenia -likely due to MGUS -monitor for signs of bleeding      Past Medical History  Diagnosis Date  . Diabetes mellitus   . Hypertension   . Hyperlipidemia   . Chronic kidney disease   . PVD (peripheral vascular disease)   . Venous insufficiency   . Morbid obesity   . Chronic cystitis   . Arthritis   . UMBILICAL HERNIA 6/97/9480    Qualifier: History of  By: [name] MD, Audelia Acton    . NEPHROLITHIASIS, HX OF 12/02/2009    Qualifier: Diagnosis of  By: [name], Cat    . Lymphedema   . CHF (congestive heart failure)   . Asthma     as a child  . H/O hiatal hernia     states it's been fixed  . Dry skin    Past Surgical History  Procedure Laterality Date  . R knee arthoscopic repair of meniscus    . Umbilical hernia repair    . Amputation      Right  and left fifth toes.   . Popliteal to posterior tibial bypass      2006  . Eye surgery Bilateral     cataract and lens implant  . Colonoscopy    . Hernia repair    . Av fistula placement Left 03/21/2014    Procedure: ARTERIOVENOUS (AV) FISTULA CREATION with ultrasound;  Surgeon: [name], MD;  Location: [facility];  Service: Vascular;  Laterality: Left;   Social History:  reports that he quit smoking about 35 years ago. He has quit using smokeless  tobacco. He reports that he does not drink alcohol or use illicit drugs.   Family History  Problem Relation Age of Onset  . Diabetes Mother   . Heart disease Mother   . Diabetes Brother      Allergies  Allergen Reactions  . Shrimp [Shellfish Allergy] Swelling  . Iodine Swelling      Prior to Admission medications   Medication Sig Start Date End Date Taking? Authorizing Provider  amLODipine (NORVASC) 10 MG tablet Take 10 mg by mouth every morning.   Yes Historical Provider, MD  aspirin EC 81 MG tablet Take 81 mg by mouth every morning.   Yes Historical Provider, MD  atorvastatin (LIPITOR) 40 MG tablet Take 1 tablet (40 mg total) by mouth daily at 6 PM. 03/11/14  Yes Frazier Richards, MD  calcitRIOL (ROCALTROL) 0.25 MCG capsule Take 0.25 mcg by mouth daily.  04/24/14  Yes Historical Provider, MD  carvedilol (COREG) 25 MG tablet Take 1 tablet (25 mg total) by mouth 2 (two) times daily with a meal. 03/11/14  Yes Frazier Richards, MD  colchicine 0.6 MG tablet Take 1 tablet (0.6 mg total) by mouth daily. 02/23/15  Yes Gregor Hams, MD  darbepoetin (ARANESP) 100 MCG/0.5ML SOLN injection Inject 100 mcg into the skin every 14 (fourteen) days. [facility].   Yes Historical Provider, MD  furosemide (LASIX) 80 MG tablet Take 80 mg by mouth 2 (two) times daily.   Yes Historical Provider, MD  glipiZIDE (GLUCOTROL) 10 MG tablet Take 5-10 mg by mouth daily before breakfast. Takes 1/2-1 tablet depending on sugar. Hold if sugar is less than 98   Yes Historical Provider, MD  hydrALAZINE (APRESOLINE) 100 MG tablet Take 1 tablet (100 mg total) by mouth 2 (two) times daily. take 1 tablet by mouth twice a day 01/20/14  Yes Waldemar Dickens, MD  ondansetron (ZOFRAN-ODT) 4 MG disintegrating tablet Take 4 mg by mouth every 8 (eight) hours as needed for nausea or vomiting.   Yes Historical Provider, MD  sodium bicarbonate 650 MG tablet Take 650 mg by mouth 3 (three) times daily.   Yes Historical Provider, MD    HYDROcodone-acetaminophen (NORCO/VICODIN) 5-325 MG per tablet Take 1 tablet by mouth every 6 (six) hours as needed. Patient not taking: Reported on 04/04/2015 02/23/15   Gregor Hams, MD    Review of Systems:  Constitutional:  No weight loss, night sweats, Fevers, chills Head&Eyes: No headache.  No vision loss.  No eye pain or scotoma ENT:  No Difficulty swallowing,Tooth/dental problems,Sore throat,   Cardio-vascular:  No chest pain, Orthopnea, PND, dizziness, palpitations  GI:  No  abdominal pain, nausea, vomiting, diarrhea, loss of appetite, hematochezia, melena, heartburn, indigestion, Resp:  No shortness of breath with exertion or at rest. No cough. No coughing up of blood .No wheezing.No chest wall deformity  Skin:  no rash or lesions.  GU:  no dysuria, change in color  of urine, no urgency or frequency. No flank pain.  Musculoskeletal:  No joint pain. No decreased range of motion. No back pain.  Psych:  No change in mood or affect. No depression or anxiety. Neurologic: No headache, no dysesthesia, no focal weakness, no vision loss. No syncope  Physical Exam: Filed Vitals:   04/04/15 1147 04/04/15 1300 04/04/15 1427 04/04/15 1430  BP: 168/63 177/70 165/49 189/60  Pulse: 63 65 71 68  Temp: 97.7 F (36.5 C)     TempSrc: Oral     Resp: '22 16 20 12  ' Height: '5\' 11"'  (1.803 m)     Weight: 123.123 kg (271 lb 7 oz)     SpO2: 98% 100% 99% 98%   General:  A&O x 3, NAD, nontoxic, pleasant/cooperative Head/Eye: No conjunctival hemorrhage, no icterus, [place]/AT, No nystagmus ENT:  No icterus,  No thrush,  no pharyngeal exudate Neck:  No masses, no lymphadenpathy, no bruits CV:  RRR, no rub, no gallop, no S3 Lung:  Bibasilar crackles. No wheeze Abdomen: soft/NT, +BS, nondistended, no peritoneal signs Ext: No cyanosis, No rashes, No petechiae, No lymphangitis, 4+LE edema with serous drainage from bilateral calf areas and multiple areas of stasis ulcerations. There is extensive  lichenification of his bilateral lower extremities Neuro: CNII-XII intact, strength 4/5 in bilateral upper and lower extremities, no dysmetria  Labs on Admission:  Basic Metabolic Panel:  Recent Labs Lab 04/04/15 1216  NA 137  K 4.5  CL 105  CO2 18*  GLUCOSE 123*  BUN 101*  CREATININE 9.22*  CALCIUM 7.8*   Liver Function Tests: No results for input(s): AST, ALT, ALKPHOS, BILITOT, PROT, ALBUMIN in the last 168 hours. No results for input(s): LIPASE, AMYLASE in the last 168 hours. No results for input(s): AMMONIA in the last 168 hours. CBC:  Recent Labs Lab 04/02/15 0925 04/04/15 1216  WBC  --  11.1*  NEUTROABS  --  7.9*  HGB 10.6* 10.0*  HCT  --  30.8*  MCV  --  82.4  PLT  --  149*   Cardiac Enzymes: No results for input(s): CKTOTAL, CKMB, CKMBINDEX, TROPONINI in the last 168 hours. BNP: Invalid input(s): POCBNP CBG: No results for input(s): GLUCAP in the last 168 hours.  Radiological Exams on Admission: Dg Chest 2 View  04/04/2015   CLINICAL DATA:  64 year old male with a history of edema  EXAM: CHEST - 2 VIEW  COMPARISON:  None.  FINDINGS: Cardiomediastinal silhouette unchanged in size and contour.  Interstitial opacities throughout. No pleural effusion or pneumothorax. Mild bronchial wall thickening. No evidence of pulmonary vascular congestion.  No displaced fracture.  Unremarkable appearance of the upper abdomen.  IMPRESSION: Interstitial opacities may reflect atypical infection, scarring secondary to prior episodes of pulmonary edema, or bronchitis/bronchiolitis. No lobar pneumonia or pleural effusion.  Signed,  [name]. [name], DO  [facility]  [facility]   Electronically Signed   By: Corrie Mckusick D.O.   On: 04/04/2015 14:00   Dg Tibia/fibula Left  04/04/2015   CLINICAL DATA:  Lower extremity edema.  Leg swelling.  EXAM: LEFT TIBIA AND FIBULA - 2 VIEW  COMPARISON:  None.  FINDINGS: Marked joint space narrowing with  osteophyte formations in the medial knee compartment. Osteophytes in the lateral knee compartment. Evidence for subcutaneous edema. Vascular calcifications. Negative for fracture or dislocation. The left ankle is located with degenerative changes. Large spur along the plantar aspect of the calcaneus. Degenerative changes in the midfoot.  IMPRESSION: No  acute bone abnormality in the left lower leg. Degenerative changes at the knee, ankle and foot.  Large calcaneal spur.   Electronically Signed   By: Markus Daft M.D.   On: 04/04/2015 14:03         Time spent:70 minutes Code Status:   FULL Family Communication:   Daughter updated at bedside   Basilio Meadow, DO  [facility] Pager [phone]  If 7PM-7AM, please contact night-coverage [email or web address] Password [facility] 04/04/2015, 4:01 PM

## 2015-04-04 NOTE — ED Notes (Signed)
Pt reports  Pain in  Both  Legs        He  Is  Here  For   evaul of  His  Dressing  And  Boots   To  Affected  Legs      -  Pt   Has  Swelling  And  Drainage  To  Affected  Legs

## 2015-04-04 NOTE — ED Notes (Signed)
Pt from [facility] for bilateral leg swelling. He noticed that both legs started draining yesterday and they are stinging. Pt is diabetic with chronic venous stasis ulcers.

## 2015-04-04 NOTE — ED Notes (Signed)
Admitting MD at bedside.

## 2015-04-04 NOTE — ED Provider Notes (Signed)
CSN: [id number]     Arrival date & time 04/04/15  1141 History   First MD Initiated Contact with Patient 04/04/15 1150     Chief Complaint  Patient presents with  . Leg Swelling     (Consider location/radiation/quality/duration/timing/severity/associated sxs/prior Treatment) HPI   PCP: Tawanna Sat, MD Blood pressure 168/63, pulse 63, temperature 97.7 F (36.5 C), temperature source Oral, resp. rate 22, height 5\' 11"  (1.803 m), weight 271 lb 7 oz (123.123 kg), SpO2 98 %.  [name] is a 64 y.o.male with a significant PMH of diabetes, hypertension, CKD, PVD, venous insufficiency, morbid obesity, chronic cystitis, arthritis, umbilical hernia, nephrolithiasis, CHF, asthma presents to the ER sent in by the Urgent Care for further evaluation of bilateral lower extremity swelling and infection. The patient has many chronic diseases and has not seen a primary care doctor for almost a year. He reports the pharmacists gives him his diabetes medications. He is due to see the wound care clinic for his legs in mid May but for the past few days he has developed acutely worsening discomfort and drainage from the legs, L > R. He denies having fevers, worsened SOB, CP, confusion, lethargy, N/V/D.   Past Medical History  Diagnosis Date  . Diabetes mellitus   . Hypertension   . Hyperlipidemia   . Chronic kidney disease   . PVD (peripheral vascular disease)   . Venous insufficiency   . Morbid obesity   . Chronic cystitis   . Arthritis   . UMBILICAL HERNIA [id number]    Qualifier: History of  By: [name] MD, Audelia Acton    . NEPHROLITHIASIS, HX OF 12/02/2009    Qualifier: Diagnosis of  By: [name], Cat    . Lymphedema   . CHF (congestive heart failure)   . Asthma     as a child  . H/O hiatal hernia     states it's been fixed  . Dry skin    Past Surgical History  Procedure Laterality Date  . R knee arthoscopic repair of meniscus    . Umbilical hernia repair    . Amputation      Right and  left fifth toes.   . Popliteal to posterior tibial bypass      2006  . Eye surgery Bilateral     cataract and lens implant  . Colonoscopy    . Hernia repair    . Av fistula placement Left 03/21/2014    Procedure: ARTERIOVENOUS (AV) FISTULA CREATION with ultrasound;  Surgeon: [name], MD;  [facility] OR;  Service: Vascular;  Laterality: Left;   Family History  Problem Relation Age of Onset  . Diabetes Mother   . Heart disease Mother   . Diabetes Brother    History  Substance Use Topics  . Smoking status: Former Smoker    Quit date: 06/06/1979  . Smokeless tobacco: Former Systems developer  . Alcohol Use: No     Comment:   "years ago" , none now    Review of Systems  10 Systems reviewed and are negative for acute change except as noted in the HPI.     Allergies  Shrimp and Iodine  Home Medications   Prior to Admission medications   Medication Sig Start Date End Date Taking? Authorizing Provider  amLODipine (NORVASC) 10 MG tablet Take 10 mg by mouth every morning.   Yes Historical Provider, MD  aspirin EC 81 MG tablet Take 81 mg by mouth every morning.   Yes Historical  Provider, MD  atorvastatin (LIPITOR) 40 MG tablet Take 1 tablet (40 mg total) by mouth daily at 6 PM. 03/11/14  Yes Frazier Richards, MD  calcitRIOL (ROCALTROL) 0.25 MCG capsule Take 0.25 mcg by mouth daily.  04/24/14  Yes Historical Provider, MD  carvedilol (COREG) 25 MG tablet Take 1 tablet (25 mg total) by mouth 2 (two) times daily with a meal. 03/11/14  Yes Frazier Richards, MD  colchicine 0.6 MG tablet Take 1 tablet (0.6 mg total) by mouth daily. 02/23/15  Yes Gregor Hams, MD  darbepoetin (ARANESP) 100 MCG/0.5ML SOLN injection Inject 100 mcg into the skin every 14 (fourteen) days. [facility].   Yes Historical Provider, MD  furosemide (LASIX) 80 MG tablet Take 80 mg by mouth 2 (two) times daily.   Yes Historical Provider, MD  glipiZIDE (GLUCOTROL) 10 MG tablet Take 5-10 mg by mouth daily before breakfast. Takes  1/2-1 tablet depending on sugar. Hold if sugar is less than 98   Yes Historical Provider, MD  hydrALAZINE (APRESOLINE) 100 MG tablet Take 1 tablet (100 mg total) by mouth 2 (two) times daily. take 1 tablet by mouth twice a day 01/20/14  Yes Waldemar Dickens, MD  ondansetron (ZOFRAN-ODT) 4 MG disintegrating tablet Take 4 mg by mouth every 8 (eight) hours as needed for nausea or vomiting.   Yes Historical Provider, MD  sodium bicarbonate 650 MG tablet Take 650 mg by mouth 3 (three) times daily.   Yes Historical Provider, MD  HYDROcodone-acetaminophen (NORCO/VICODIN) 5-325 MG per tablet Take 1 tablet by mouth every 6 (six) hours as needed. Patient not taking: Reported on 04/04/2015 02/23/15   Gregor Hams, MD   BP 189/60 mmHg  Pulse 68  Temp(Src) 97.7 F (36.5 C) (Oral)  Resp 12  Ht 5\' 11"  (1.803 m)  Wt 271 lb 7 oz (123.123 kg)  BMI 37.87 kg/m2  SpO2 98% Physical Exam  Constitutional: He appears well-developed and well-nourished. No distress.  HENT:  Head: Normocephalic and atraumatic.  Eyes: Pupils are equal, round, and reactive to light.  Neck: Normal range of motion. Neck supple.  Cardiovascular: Normal rate and regular rhythm.   Pulmonary/Chest: Effort normal.  Abdominal: Soft.  Musculoskeletal:  Old left small toe on right foot amputation. Severe chronic changes of venous stasis to bilateral lower extremities. The left leg has desquamation of superficial posterior tissue to the distal tib/fib.  The anterior left tib/fib soft tissue has swelling with clear fluid drainage.  Neurological: He is alert.  Skin: Skin is warm and dry.  Nursing note and vitals reviewed.   ED Course  Procedures (including critical care time) Labs Review Labs Reviewed  CBC WITH DIFFERENTIAL/PLATELET - Abnormal; Notable for the following:    WBC 11.1 (*)    RBC 3.74 (*)    Hemoglobin 10.0 (*)    HCT 30.8 (*)    RDW 17.3 (*)    Platelets 149 (*)    Neutro Abs 7.9 (*)    Monocytes Absolute 1.1 (*)     All other components within normal limits  BASIC METABOLIC PANEL - Abnormal; Notable for the following:    CO2 18 (*)    Glucose, Bld 123 (*)    BUN 101 (*)    Creatinine, Ser 9.22 (*)    Calcium 7.8 (*)    GFR calc non Af Amer 5 (*)    GFR calc Af Amer 6 (*)    All other components within normal limits  Imaging Review Dg Chest 2 View  04/04/2015   CLINICAL DATA:  64 year old male with a history of edema  EXAM: CHEST - 2 VIEW  COMPARISON:  None.  FINDINGS: Cardiomediastinal silhouette unchanged in size and contour.  Interstitial opacities throughout. No pleural effusion or pneumothorax. Mild bronchial wall thickening. No evidence of pulmonary vascular congestion.  No displaced fracture.  Unremarkable appearance of the upper abdomen.  IMPRESSION: Interstitial opacities may reflect atypical infection, scarring secondary to prior episodes of pulmonary edema, or bronchitis/bronchiolitis. No lobar pneumonia or pleural effusion.  Signed,  [name]. [name], DO  [facility]  [facility]   Electronically Signed   By: Corrie Mckusick D.O.   On: 04/04/2015 14:00   Dg Tibia/fibula Left  04/04/2015   CLINICAL DATA:  Lower extremity edema.  Leg swelling.  EXAM: LEFT TIBIA AND FIBULA - 2 VIEW  COMPARISON:  None.  FINDINGS: Marked joint space narrowing with osteophyte formations in the medial knee compartment. Osteophytes in the lateral knee compartment. Evidence for subcutaneous edema. Vascular calcifications. Negative for fracture or dislocation. The left ankle is located with degenerative changes. Large spur along the plantar aspect of the calcaneus. Degenerative changes in the midfoot.  IMPRESSION: No acute bone abnormality in the left lower leg. Degenerative changes at the knee, ankle and foot.  Large calcaneal spur.   Electronically Signed   By: Markus Daft M.D.   On: 04/04/2015 14:03     EKG Interpretation None      MDM   Final diagnoses:  Lower  extremity edema  Chronic kidney disease, unspecified stage    Medications  oxyCODONE-acetaminophen (PERCOCET/ROXICET) 5-325 MG per tablet 1 tablet (1 tablet Oral Given 04/04/15 1244)  ondansetron (ZOFRAN-ODT) disintegrating tablet 4 mg (4 mg Oral Given 04/04/15 1245)    The patients BMP shows worsening renal function: he has a fistula but has not done a dialysis treatment. His CBC shows a mildly elevated WBC.  Chest xray shows:  Interstitial opacities may reflect atypical infection, scarring secondary to prior episodes of pulmonary edema, or bronchitis/bronchiolitis. No lobar pneumonia or pleural effusion.  Leg xray shows no osteomyelitis findings.  I believe the patient would benefit from IV abx and wound care. He has no way to follow-up as an outpatient within a reasonable amount of time. I spoke with Unassigned physician with [facility] who agreed to come see the patient. He did not request 'Temp Admit' Orders.  Filed Vitals:   04/04/15 1430  BP: 189/60  Pulse: 68  Temp:   Resp: [address], PA-C 04/04/15 1442  Orpah Greek, MD 04/04/15 [phone]

## 2015-04-04 NOTE — Progress Notes (Signed)
Called ED for report, spoke with Levada Dy

## 2015-04-04 NOTE — Progress Notes (Signed)
Patient arrived on unit from ED via stretcher.  Family at bedside.  Telemetry placed per MD order.

## 2015-04-04 NOTE — ED Provider Notes (Signed)
Patient presented to the ER with any swelling left leg. Patient reports a history of chronic swelling with chronic changes of the skin of both of his legs, but in the last 2 weeks he has had significant increase in swelling and pain of the left leg. He has spontaneously started draining.  Face to face Exam: HEENT - PERRLA Lungs - CTAB Heart - RRR, no M/R/G Abd - S/NT/ND Neuro - alert, oriented x3 Skin - severe chronic skin changes both legs, but there is focal areas of swelling, spongy Nissen possible fluctuance of left lower leg with multiple areas of skin sloughing and ulceration  Plan: Patient likely has acute cellulitis underlying the significant chronic skin changes with acute sloughing of skin will require treatment for infection. To be admitted by the hospitalist service for antibiotic coverage.  Orpah Greek, MD 04/04/15 [phone]

## 2015-04-05 DIAGNOSIS — E1121 Type 2 diabetes mellitus with diabetic nephropathy: Secondary | ICD-10-CM | POA: Diagnosis not present

## 2015-04-05 DIAGNOSIS — N179 Acute kidney failure, unspecified: Secondary | ICD-10-CM | POA: Diagnosis not present

## 2015-04-05 DIAGNOSIS — R7989 Other specified abnormal findings of blood chemistry: Secondary | ICD-10-CM | POA: Diagnosis not present

## 2015-04-05 DIAGNOSIS — R600 Localized edema: Secondary | ICD-10-CM | POA: Diagnosis not present

## 2015-04-05 DIAGNOSIS — E785 Hyperlipidemia, unspecified: Secondary | ICD-10-CM

## 2015-04-05 DIAGNOSIS — I5032 Chronic diastolic (congestive) heart failure: Secondary | ICD-10-CM

## 2015-04-05 DIAGNOSIS — N185 Chronic kidney disease, stage 5: Secondary | ICD-10-CM | POA: Diagnosis not present

## 2015-04-05 DIAGNOSIS — I120 Hypertensive chronic kidney disease with stage 5 chronic kidney disease or end stage renal disease: Secondary | ICD-10-CM | POA: Diagnosis not present

## 2015-04-05 DIAGNOSIS — I890 Lymphedema, not elsewhere classified: Secondary | ICD-10-CM | POA: Diagnosis not present

## 2015-04-05 DIAGNOSIS — N189 Chronic kidney disease, unspecified: Secondary | ICD-10-CM | POA: Diagnosis not present

## 2015-04-05 LAB — CBC
HEMATOCRIT: 29.5 % — AB (ref 39.0–52.0)
HEMOGLOBIN: 9.4 g/dL — AB (ref 13.0–17.0)
MCH: 26.7 pg (ref 26.0–34.0)
MCHC: 31.9 g/dL (ref 30.0–36.0)
MCV: 83.8 fL (ref 78.0–100.0)
Platelets: 172 10*3/uL (ref 150–400)
RBC: 3.52 MIL/uL — ABNORMAL LOW (ref 4.22–5.81)
RDW: 17.7 % — ABNORMAL HIGH (ref 11.5–15.5)
WBC: 11.7 10*3/uL — ABNORMAL HIGH (ref 4.0–10.5)

## 2015-04-05 LAB — C-REACTIVE PROTEIN: CRP: 6.1 mg/dL — ABNORMAL HIGH (ref <0.60)

## 2015-04-05 LAB — COMPREHENSIVE METABOLIC PANEL
ALBUMIN: 2.4 g/dL — AB (ref 3.5–5.2)
ALK PHOS: 43 U/L (ref 39–117)
ALT: 30 U/L (ref 0–53)
ANION GAP: 14 (ref 5–15)
AST: 14 U/L (ref 0–37)
BILIRUBIN TOTAL: 0.7 mg/dL (ref 0.3–1.2)
BUN: 100 mg/dL — ABNORMAL HIGH (ref 6–23)
CO2: 19 mmol/L (ref 19–32)
Calcium: 7.8 mg/dL — ABNORMAL LOW (ref 8.4–10.5)
Chloride: 106 mmol/L (ref 96–112)
Creatinine, Ser: 9.08 mg/dL — ABNORMAL HIGH (ref 0.50–1.35)
GFR calc non Af Amer: 5 mL/min — ABNORMAL LOW (ref >90)
GFR, EST AFRICAN AMERICAN: 6 mL/min — AB (ref >90)
GLUCOSE: 108 mg/dL — AB (ref 70–99)
Potassium: 4.5 mmol/L (ref 3.5–5.1)
Sodium: 139 mmol/L (ref 135–145)
TOTAL PROTEIN: 6.6 g/dL (ref 6.0–8.3)

## 2015-04-05 LAB — GLUCOSE, CAPILLARY
GLUCOSE-CAPILLARY: 125 mg/dL — AB (ref 70–99)
Glucose-Capillary: 106 mg/dL — ABNORMAL HIGH (ref 70–99)
Glucose-Capillary: 170 mg/dL — ABNORMAL HIGH (ref 70–99)
Glucose-Capillary: 158 mg/dL — ABNORMAL HIGH (ref 70–99)

## 2015-04-05 MED ORDER — ATORVASTATIN CALCIUM 40 MG PO TABS
40.0000 mg | ORAL_TABLET | Freq: Every day | ORAL | Status: DC
  Administered 2015-04-05: 40 mg via ORAL
  Filled 2015-04-05 (×2): qty 1

## 2015-04-05 NOTE — Progress Notes (Signed)
Orthopedic Tech Progress Note Patient Details:  [name] [birth year] [id number] Applied bilateral Unna boots to lower extremities.  Bilateral pulses, motion, sensation intact before and after application.  Bilateral capillary refill less than 2 seconds before and after application. Ortho Devices Type of Ortho Device: Haematologist Ortho Device/Splint Location: bilateral Ortho Device/Splint Interventions: Application   [name] 04/05/2015, 8:03 PM

## 2015-04-05 NOTE — Progress Notes (Signed)
  [facility] Progress Note   Subjective: no c/o's  Filed Vitals:   04/04/15 1954 04/05/15 0500 04/05/15 0511 04/05/15 0810  BP: 181/69  160/69 152/73  Pulse: 67  63 57  Temp: 99 F (37.2 C)  98.8 F (37.1 C) 98.4 F (36.9 C)  TempSrc:    Oral  Resp: 18  18 17   Height:      Weight:  123.333 kg (271 lb 14.4 oz)    SpO2: 100%  99% 100%   Exam: Alert, obese AAF, no distress No rash, cyanosis or gangrene Sclera anicteric, throat clear No JVD or bruits Chest clear bilat , no rales or wheezing RRR 2/6 holosyst M at apex, no RG Abd obese, soft, NTND, no abd wall edema GU normal male LE's 2+ edema lower legs R> L, 1+ R thigh edema Chronic brawny skin changes lower legs w skin cracking, peeling, tenderness L > R Neuro is alert, Ox 3  Last ECHO 12/14 normal LV/ RV CXR mild IS opacities , no gross edema   Assessment: 1. Advanced CKD stage V - no gross uremic symptoms. He does not want to start dialysis until he has to and there is no indication right now to start dialysis. D/ W Dr Lorrene Reid who sees him in the office and his baseline creat now is around 8-9.  So no further suggestions from our standpoint. Will sign off.  2. Chronic LE venous insufficiency/ stasis dermatitis - no wbc, fever. No abx for now per primary.  3. Morbid obesity 4. HTN  Plan - as above    [name] MD  pager [phone]    cell [phone]  04/05/2015, 3:19 PM     Recent Labs Lab 04/04/15 1216 04/05/15 0533  NA 137 139  K 4.5 4.5  CL 105 106  CO2 18* 19  GLUCOSE 123* 108*  BUN 101* 100*  CREATININE 9.22* 9.08*  CALCIUM 7.8* 7.8*    Recent Labs Lab 04/05/15 0533  AST 14  ALT 30  ALKPHOS 43  BILITOT 0.7  PROT 6.6  ALBUMIN 2.4*    Recent Labs Lab 04/02/15 0925 04/04/15 1216 04/05/15 0533  WBC  --  11.1* 11.7*  NEUTROABS  --  7.9*  --   HGB 10.6* 10.0* 9.4*  HCT  --  30.8* 29.5*  MCV  --  82.4 83.8  PLT  --  149* 172   . amLODipine  10 mg Oral q morning -  10a  . aspirin EC  81 mg Oral q morning - 10a  . atorvastatin  40 mg Oral q1800  . calcitRIOL  0.25 mcg Oral Daily  . carvedilol  25 mg Oral BID WC  . furosemide  80 mg Oral BID  . heparin  5,000 Units Subcutaneous 3 times per day  . hydrALAZINE  100 mg Oral BID  . insulin aspart  0-5 Units Subcutaneous QHS  . insulin aspart  0-9 Units Subcutaneous TID WC  . sodium bicarbonate  650 mg Oral TID  . sodium chloride  3 mL Intravenous Q12H     acetaminophen **OR** acetaminophen, ondansetron **OR** ondansetron (ZOFRAN) IV

## 2015-04-05 NOTE — Consult Note (Signed)
WOC wound consult note Reason for Consult: Patient with long-standing history of lymphedema of the bilateral LEs.  Has been a patient of the outpatient wound care center in the past and used Unna's Boots to heal the wounds and reduce the edema until he was qualified for a reusable stocking (Juxtaflex).  Patient has had to discontinue those recently and the edema has returned.  He called the outpatient wound care center and talked to Shanon Brow who gave him the fist appointment they had: on May 11th.  Wound type:vascular insufficiency Pressure Ulcer POA: No Measurement:left posterior LE:  5cm x 15cm x 0.2cm  Right posterior LE:  3cm x 2cm x 0.2cm Wound bed:red, moist Drainage (amount, consistency, odor) serous exudate, L>R Periwound:hard, discolored skin consistent with lymphedema of long-standing duration. Dressing procedure/placement/frequency:We can begin topical wound care and Unna's Boots here in acute care and suggest that they be managed (changed) twice weekly until the patient can be seen in the outpatient wound clinic.If you agree, please order. [place] nursing team will not follow, but will remain available to this patient, the nursing and medical team.  Please re-consult if needed. Thanks, Maudie Flakes, MSN, RN, [place], [place], [place] [phone])

## 2015-04-05 NOTE — Progress Notes (Signed)
[facility] PROGRESS NOTE  [name] IOE:[id number] DOB: 07/28/1951 DOA: 04/04/2015 PCP: Tawanna Sat, MD  Assessment/Plan: Acute on chronic renal failure (CKD5) -consulted nephrology--Dr. Melvia Heaps -Suspect that the patient is progression to initiation of hemodialysis -BUN 100; creatinine 9.03; electrolytes otherwise within normal limits -That is no rubs on his chest auscultation, no encephalopathy, no nausea, no vomiting. Bilateral lower extremity 2-3+ as main sign of fluid overload. -Continue Lasix home dose and allow renal service to determine either higher doses of Lasix will be initiated versus transition to hemodialysis therapy. -Continue calcitriol and bicarbonate -No indication for emergent dialysis at this moment.  Lower extremity lymphedema -In part due to the patient's worsening renal function, leading to worsening fluid retention and edema -Consult wound care nurse--for potential use of Unna boots -will not start abx at this time--do not appear to be infected -ESR, CRP; mild elevated -CK WNL -Venous duplex r/o DVT pending -echo to assess EF; had hx of diastolic heart failure (last echo in 1829  Chronic diastolic HF -patient w/o SOB and just mildly elevated BNP -has bilateral LE edema but doubt to be secondary to HF -continue daily weights -low sodium diet and strict intake and output -Will follow 2-D echo  Diabetes mellitus type 2 with renal complications -Will follow Hemoglobin A1c -Continue NovoLog sliding scale  Hyperlipidemia -Continue Lipitor   Hypertension -Continue amlodipine, carvedilol, hydralazine and Lasix -Blood pressure is a stable and well controlled.  Thrombocytopenia -likely due to MGUS -No signs of overt bleeding -Repeated platelets count 172 -Will monitor treatment  Code Status: Full code Family Communication: No family at bedside Disposition Plan: To be determined. Patient most likely going back home once medical conditions  are enhanced.  Consultants:  Renal service  Wound care  Procedures:  See below for x-ray reports  Antibiotics:  None  HPI/Subjective: Afebrile, denies chest pain, denies shortness of breath. Patient with 1.9 L urine output. Expresses that he feels that his urine the same and his main concern is when the wound care nurses will apply the unna boots  Objective: Filed Vitals:   04/05/15 0810  BP: 152/73  Pulse: 57  Temp: 98.4 F (36.9 C)  Resp: 17    Intake/Output Summary (Last 24 hours) at 04/05/15 1356 Last data filed at 04/05/15 1304  Gross per 24 hour  Intake    483 ml  Output   2385 ml  Net  -1902 ml   Filed Weights   04/04/15 1147 04/04/15 1730 04/05/15 0500  Weight: 123.123 kg (271 lb 7 oz) 123.333 kg (271 lb 14.4 oz) 123.333 kg (271 lb 14.4 oz)    Exam:   General:  Afebrile, denies chest pain, denies shortness of breath  Cardiovascular: S1 and S2, no rubs, no gallops  Respiratory: Good air movement, no wheezing, decreased breath sounds at the bases, no frank crackles  Abdomen: Soft, nontender, nondistended; positive bowel sounds  Musculoskeletal: Bilateral lower extremity edema 2-3+; with chronic changes from stasis dermatitis and lymphedema; serosanguineous discharge appreciated on back aspect of both legs were part of his skin has been disrupted with swelling.  Data Reviewed: Basic Metabolic Panel:  Recent Labs Lab 04/04/15 1216 04/05/15 0533  NA 137 139  K 4.5 4.5  CL 105 106  CO2 18* 19  GLUCOSE 123* 108*  BUN 101* 100*  CREATININE 9.22* 9.08*  CALCIUM 7.8* 7.8*   Liver Function Tests:  Recent Labs Lab 04/05/15 0533  AST 14  ALT 30  ALKPHOS 43  BILITOT 0.7  PROT 6.6  ALBUMIN 2.4*   CBC:  Recent Labs Lab 04/02/15 0925 04/04/15 1216 04/05/15 0533  WBC  --  11.1* 11.7*  NEUTROABS  --  7.9*  --   HGB 10.6* 10.0* 9.4*  HCT  --  30.8* 29.5*  MCV  --  82.4 83.8  PLT  --  149* 172   Cardiac Enzymes:  Recent Labs Lab  04/04/15 1837  CKTOTAL 163   BNP (last 3 results)  Recent Labs  04/04/15 1837  BNP 316.8*   CBG:  Recent Labs Lab 04/04/15 1731 04/04/15 2106 04/05/15 0717 04/05/15 1200  GLUCAP 145* 166* 106* 170*   Studies: Dg Chest 2 View  04/04/2015   CLINICAL DATA:  64 year old male with a history of edema  EXAM: CHEST - 2 VIEW  COMPARISON:  None.  FINDINGS: Cardiomediastinal silhouette unchanged in size and contour.  Interstitial opacities throughout. No pleural effusion or pneumothorax. Mild bronchial wall thickening. No evidence of pulmonary vascular congestion.  No displaced fracture.  Unremarkable appearance of the upper abdomen.  IMPRESSION: Interstitial opacities may reflect atypical infection, scarring secondary to prior episodes of pulmonary edema, or bronchitis/bronchiolitis. No lobar pneumonia or pleural effusion.  Signed,  [name]. [name], DO  [facility]  [facility]   Electronically Signed   By: Corrie Mckusick D.O.   On: 04/04/2015 14:00   Dg Tibia/fibula Left  04/04/2015   CLINICAL DATA:  Lower extremity edema.  Leg swelling.  EXAM: LEFT TIBIA AND FIBULA - 2 VIEW  COMPARISON:  None.  FINDINGS: Marked joint space narrowing with osteophyte formations in the medial knee compartment. Osteophytes in the lateral knee compartment. Evidence for subcutaneous edema. Vascular calcifications. Negative for fracture or dislocation. The left ankle is located with degenerative changes. Large spur along the plantar aspect of the calcaneus. Degenerative changes in the midfoot.  IMPRESSION: No acute bone abnormality in the left lower leg. Degenerative changes at the knee, ankle and foot.  Large calcaneal spur.   Electronically Signed   By: Markus Daft M.D.   On: 04/04/2015 14:03    Scheduled Meds: . amLODipine  10 mg Oral q morning - 10a  . aspirin EC  81 mg Oral q morning - 10a  . calcitRIOL  0.25 mcg Oral Daily  . carvedilol  25 mg Oral BID WC  .  furosemide  80 mg Oral BID  . heparin  5,000 Units Subcutaneous 3 times per day  . hydrALAZINE  100 mg Oral BID  . insulin aspart  0-5 Units Subcutaneous QHS  . insulin aspart  0-9 Units Subcutaneous TID WC  . sodium bicarbonate  650 mg Oral TID  . sodium chloride  3 mL Intravenous Q12H   Continuous Infusions:   Active Problems:   MGUS (monoclonal gammopathy of unknown significance)   Acute on chronic renal failure   Lymphedema of lower extremity   DM (diabetes mellitus), type 2 with renal complications    Time spent: 30 minutes    Barton Dubois  [facility] Pager [phone] If 7PM-7AM, please contact night-coverage at [email or web address], password [email or web address] 04/05/2015, 1:56 PM  LOS: 1 day

## 2015-04-05 NOTE — Progress Notes (Signed)
BLE cleansed with NS & foam dressings applied to LLE per order.  Ortho tech notified to apply [employer],

## 2015-04-06 DIAGNOSIS — I890 Lymphedema, not elsewhere classified: Secondary | ICD-10-CM | POA: Diagnosis not present

## 2015-04-06 DIAGNOSIS — N189 Chronic kidney disease, unspecified: Secondary | ICD-10-CM | POA: Insufficient documentation

## 2015-04-06 DIAGNOSIS — N289 Disorder of kidney and ureter, unspecified: Secondary | ICD-10-CM

## 2015-04-06 DIAGNOSIS — D472 Monoclonal gammopathy: Secondary | ICD-10-CM | POA: Diagnosis not present

## 2015-04-06 DIAGNOSIS — E1121 Type 2 diabetes mellitus with diabetic nephropathy: Secondary | ICD-10-CM | POA: Diagnosis not present

## 2015-04-06 DIAGNOSIS — Q103 Other congenital malformations of eyelid: Secondary | ICD-10-CM

## 2015-04-06 DIAGNOSIS — E1129 Type 2 diabetes mellitus with other diabetic kidney complication: Secondary | ICD-10-CM

## 2015-04-06 LAB — HEMOGLOBIN A1C
Hgb A1c MFr Bld: 6 % — ABNORMAL HIGH (ref 4.8–5.6)
MEAN PLASMA GLUCOSE: 126 mg/dL

## 2015-04-06 LAB — GLUCOSE, CAPILLARY
Glucose-Capillary: 106 mg/dL — ABNORMAL HIGH (ref 70–99)
Glucose-Capillary: 155 mg/dL — ABNORMAL HIGH (ref 70–99)

## 2015-04-06 MED ORDER — COLCHICINE 0.6 MG PO TABS: 0.3000 mg | ORAL_TABLET | Freq: Every day | ORAL | Status: DC

## 2015-04-06 NOTE — Progress Notes (Signed)
Discharge instructions and medications discussed with patient.  Prescriptions given to patient.  All questions answered.  

## 2015-04-06 NOTE — Care Management Note (Signed)
CARE MANAGEMENT NOTE 04/06/2015  Patient:  JARRIS, KORTZ   Account Number:  [id number]  Date Initiated:  04/06/2015  Documentation initiated by:  Derrious Bologna  Subjective/Objective Assessment:   CM following for progression and d/c planning.     Action/Plan:   04/06/2015 Met with pt and girlfriend. [facility] selected for HH needs , unna boots.   Anticipated DC Date:  04/06/2015   Anticipated DC Plan:  [place]         Choice offered to / List presented to:          [facility] arranged  HH-1 RN      [place].   Status of service:  Completed, signed off Medicare Important Message given?  YES (If response is "NO", the following Medicare IM given date fields will be blank) Date Medicare IM given:  04/06/2015 Medicare IM given by:  Laurent Cargile Date Additional Medicare IM given:   Additional Medicare IM given by:    Discharge Disposition:    Per UR Regulation:    If discussed at Long Length of Stay Meetings, dates discussed:    Comments:

## 2015-04-06 NOTE — Discharge Summary (Addendum)
Physician Discharge Summary  [name] [id number] DOB: [birth year] DOA: 04/04/2015  PCP: Patrick Sat, MD  Admit date: 04/04/2015 Discharge date: 04/06/2015  Time spent: >30 minutes  Recommendations for Outpatient Follow-up:  1. Follow renal function and electrolytes 2. Decision into when HD is truly in need to start, as this modality will help with swelling and controlling LE edema 3. Repeat CBC to follow platelets trend  Discharge Diagnoses:  Lymphedema distichiasis syndrome with kidney disease and diabetes mellitus MGUS (monoclonal gammopathy of unknown significance) Acute on chronic renal failure HTN Chronic diastolic heart fialure DM (diabetes mellitus), type 2 with renal complications    Discharge Condition: stable and improved. Discharge to home with follow with nephrologist on 04/07/15. [name] services for wound care twice a week arranged.  Diet recommendation: low sodium and low carbohydrates diet  Filed Weights   04/04/15 1730 04/05/15 0500 04/06/15 0404  Weight: 123.333 kg (271 lb 14.4 oz) 123.333 kg (271 lb 14.4 oz) 123.333 kg (271 lb 14.4 oz)    History of present illness:  64 year old male with a history of CKD stage V, MGUS, diastolic CHF, and diabetes mellitus presents with 2 week history of progressive lower extremity edema, left greater than right. The patient states that he used to have Unna boots or his lower extremities, but this was last done approximately one year ago. He contacted the wound care center at [facility], but he could not get in to see them until sometime in May 2016. The patient states that he has not seen a primary care physician for nearly 1 year, but he claims that his nephrologist Dr. Jamal [name] has been prescribing his antihypertensive medications as well as his furosemide. It is unclear how he is obtaining his opioids and diabetic medications. Apparently, the patient states that he has been seeing Dr. Lorrene [name] every 6 weeks. He has been  advised to consider starting dialysis, but he appears to be somewhat resistant. The patient had a left upper extremity AV fistula placed on 03/21/2014. Over the past 2 weeks he has noted increasing swelling and clear fluid drainage from his bilateral lower extremities. He denies any fevers, chills, chest discomfort, shortness of breath, nausea, vomiting, diarrhea, abdominal pain, dysuria, hematuria. The patient states that he is able to ambulate without pain in his legs.  Hospital Course:  Acute on chronic renal failure (CKD5) -consulted nephrology--Dr. Melvia [name] -Suspect that the patient is progressing to initiation of hemodialysis -BUN 100; creatinine 9.03; electrolytes otherwise within normal limits -There is no rubs on his chest auscultation, no encephalopathy, no nausea, no vomiting. Bilateral lower extremity 2-3+ as main sign of fluid overload. -Continue Lasix home dose and allow renal service to determine either higher doses of Lasix will be initiated versus transition to hemodialysis therapy. -Continue calcitriol and bicarbonate -No indication for emergent dialysis at this moment.  Lower extremity lymphedema -In part due to the patient's worsening renal function, leading to worsening fluid retention and edema -Consult wound care nurse--patient with unna boots in place and [facility] services arrangements for wound care, eventually will be able to go to wound care center, appointment made for May 26 -will not start abx at this time--do not appear to be infected; follow up wound status at wound care and initiate abx;s if needed down the road. -ESR, CRP; mildly elevated; not specific markers  -CK WNL -Venous duplex to r/o DVT discontinue; patient w/o pain and refuse to have duplex  -will recommend outpatient echo to reassess EF; had hx of  diastolic heart failure (last echo in 2014)  Chronic diastolic HF -patient w/o SOB and just mildly elevated BNP -has bilateral LE edema but doubt to be secondary  to HF; mainly in setting of lymphedema -advise to follow low sodium diet and to continue daily weights  Diabetes mellitus type 2 with renal complications -V4Q 6.0 -Continue NovoLog sliding scale -patient advise to follow low carb diet  Hyperlipidemia -Continue Lipitor   Hypertension -Continue amlodipine, carvedilol, hydralazine and Lasix -Blood pressure is a stable and well controlled.  Thrombocytopenia -likely due to MGUS -No signs of overt bleeding -Repeated platelets count WNL  (172) -Will monitor trend in outpatient setting  Procedures:  See below for x-ray reports   Consultations:  Renal service   Discharge Exam: Filed Vitals:   04/06/15 0802  BP: 166/79  Pulse: 62  Temp: 98.1 F (36.7 C)  Resp: 18    General: Afebrile, denies chest pain, denies shortness of breath. Unna boots now in place.  Cardiovascular: S1 and S2, no rubs, no gallops  Respiratory: Good air movement, no wheezing, decreased breath sounds at the bases, no frank crackles  Abdomen: Soft, nontender, nondistended; positive bowel sounds  Musculoskeletal: Bilateral lower extremity unna boots in place; no signs of cyanosis.   Discharge Instructions   Discharge Instructions    Diet - low sodium heart healthy    Complete by:  As directed      Discharge instructions    Complete by:  As directed   Take medications as prescribed Follow with nephrology service on 04/08/15 (call office for appointment details) Follow low sodium diet (less than 2 gram daily) Follow instructions from wound care nurses regarding care of Unna Boots When sitting keep your legs elevated to help with swelling          Current Discharge Medication List    CONTINUE these medications which have CHANGED   Details  colchicine 0.6 MG tablet Take 0.5 tablets (0.3 mg total) by mouth daily. Qty: 30 tablet, Refills: 0      CONTINUE these medications which have NOT CHANGED   Details  amLODipine (NORVASC) 10 MG  tablet Take 10 mg by mouth every morning.    aspirin EC 81 MG tablet Take 81 mg by mouth every morning.    atorvastatin (LIPITOR) 40 MG tablet Take 1 tablet (40 mg total) by mouth daily at 6 PM. Qty: 30 tablet, Refills: 11    calcitRIOL (ROCALTROL) 0.25 MCG capsule Take 0.25 mcg by mouth daily.     carvedilol (COREG) 25 MG tablet Take 1 tablet (25 mg total) by mouth 2 (two) times daily with a meal. Qty: 60 tablet, Refills: 11    darbepoetin (ARANESP) 100 MCG/0.5ML SOLN injection Inject 100 mcg into the skin every 14 (fourteen) days. [facility].    furosemide (LASIX) 80 MG tablet Take 80 mg by mouth 2 (two) times daily.    glipiZIDE (GLUCOTROL) 10 MG tablet Take 5-10 mg by mouth daily before breakfast. Takes 1/2-1 tablet depending on sugar. Hold if sugar is less than 98    hydrALAZINE (APRESOLINE) 100 MG tablet Take 1 tablet (100 mg total) by mouth 2 (two) times daily. take 1 tablet by mouth twice a day Qty: 180 tablet, Refills: 3    ondansetron (ZOFRAN-ODT) 4 MG disintegrating tablet Take 4 mg by mouth every 8 (eight) hours as needed for nausea or vomiting.    sodium bicarbonate 650 MG tablet Take 650 mg by mouth 3 (three) times daily.  STOP taking these medications     HYDROcodone-acetaminophen (NORCO/VICODIN) 5-325 MG per tablet        Allergies  Allergen Reactions  . Shrimp [Shellfish Allergy] Swelling  . Iodine Swelling   Follow-up Information    Follow up with DUNHAM,CYNTHIA B, MD. Go on 04/08/2015.   Specialty:  Nephrology   Why:  call office for appointment details    Contact information:   [place] [address] [phone]       The results of significant diagnostics from this hospitalization (including imaging, microbiology, ancillary and laboratory) are listed below for reference.    Significant Diagnostic Studies: Dg Chest 2 View  04/04/2015   CLINICAL DATA:  64 year old male with a history of edema  EXAM: CHEST - 2 VIEW   COMPARISON:  None.  FINDINGS: Cardiomediastinal silhouette unchanged in size and contour.  Interstitial opacities throughout. No pleural effusion or pneumothorax. Mild bronchial wall thickening. No evidence of pulmonary vascular congestion.  No displaced fracture.  Unremarkable appearance of the upper abdomen.  IMPRESSION: Interstitial opacities may reflect atypical infection, scarring secondary to prior episodes of pulmonary edema, or bronchitis/bronchiolitis. No lobar pneumonia or pleural effusion.  Signed,  [name]. [name], DO  [facility]  [facility]   Electronically Signed   By: Corrie Mckusick D.O.   On: 04/04/2015 14:00   Dg Tibia/fibula Left  04/04/2015   CLINICAL DATA:  Lower extremity edema.  Leg swelling.  EXAM: LEFT TIBIA AND FIBULA - 2 VIEW  COMPARISON:  None.  FINDINGS: Marked joint space narrowing with osteophyte formations in the medial knee compartment. Osteophytes in the lateral knee compartment. Evidence for subcutaneous edema. Vascular calcifications. Negative for fracture or dislocation. The left ankle is located with degenerative changes. Large spur along the plantar aspect of the calcaneus. Degenerative changes in the midfoot.  IMPRESSION: No acute bone abnormality in the left lower leg. Degenerative changes at the knee, ankle and foot.  Large calcaneal spur.   Electronically Signed   By: Markus Daft M.D.   On: 04/04/2015 14:03   Labs: Basic Metabolic Panel:  Recent Labs Lab 04/04/15 1216 04/05/15 0533  NA 137 139  K 4.5 4.5  CL 105 106  CO2 18* 19  GLUCOSE 123* 108*  BUN 101* 100*  CREATININE 9.22* 9.08*  CALCIUM 7.8* 7.8*   Liver Function Tests:  Recent Labs Lab 04/05/15 0533  AST 14  ALT 30  ALKPHOS 43  BILITOT 0.7  PROT 6.6  ALBUMIN 2.4*   CBC:  Recent Labs Lab 04/02/15 0925 04/04/15 1216 04/05/15 0533  WBC  --  11.1* 11.7*  NEUTROABS  --  7.9*  --   HGB 10.6* 10.0* 9.4*  HCT  --  30.8* 29.5*  MCV  --   82.4 83.8  PLT  --  149* 172   Cardiac Enzymes:  Recent Labs Lab 04/04/15 1837  CKTOTAL 163   BNP: BNP (last 3 results)  Recent Labs  04/04/15 1837  BNP 316.8*   CBG:  Recent Labs Lab 04/05/15 0717 04/05/15 1200 04/05/15 1710 04/05/15 2034 04/06/15 0735  GLUCAP 106* 170* 125* 158* 106*    Signed:  Barton Dubois  [facility] 04/06/2015, 10:59 AM

## 2015-04-06 NOTE — Progress Notes (Signed)
Chaplain received consult concerning Advanced Directive.   [name] noted that he had the packet and is going over it as he can.   Will call when ready to proceed should he decide to do so.   Delford Field, Chaplain 04/06/2015

## 2015-04-08 DIAGNOSIS — N184 Chronic kidney disease, stage 4 (severe): Secondary | ICD-10-CM | POA: Diagnosis not present

## 2015-04-08 DIAGNOSIS — I129 Hypertensive chronic kidney disease with stage 1 through stage 4 chronic kidney disease, or unspecified chronic kidney disease: Secondary | ICD-10-CM | POA: Diagnosis not present

## 2015-04-08 DIAGNOSIS — I5030 Unspecified diastolic (congestive) heart failure: Secondary | ICD-10-CM | POA: Diagnosis not present

## 2015-04-08 DIAGNOSIS — E1129 Type 2 diabetes mellitus with other diabetic kidney complication: Secondary | ICD-10-CM | POA: Diagnosis not present

## 2015-04-08 DIAGNOSIS — I890 Lymphedema, not elsewhere classified: Secondary | ICD-10-CM | POA: Diagnosis not present

## 2015-04-08 DIAGNOSIS — E785 Hyperlipidemia, unspecified: Secondary | ICD-10-CM | POA: Diagnosis not present

## 2015-04-10 DIAGNOSIS — I129 Hypertensive chronic kidney disease with stage 1 through stage 4 chronic kidney disease, or unspecified chronic kidney disease: Secondary | ICD-10-CM | POA: Diagnosis not present

## 2015-04-11 DIAGNOSIS — I5030 Unspecified diastolic (congestive) heart failure: Secondary | ICD-10-CM | POA: Diagnosis not present

## 2015-04-11 DIAGNOSIS — N184 Chronic kidney disease, stage 4 (severe): Secondary | ICD-10-CM | POA: Diagnosis not present

## 2015-04-11 DIAGNOSIS — E1129 Type 2 diabetes mellitus with other diabetic kidney complication: Secondary | ICD-10-CM | POA: Diagnosis not present

## 2015-04-11 DIAGNOSIS — I890 Lymphedema, not elsewhere classified: Secondary | ICD-10-CM | POA: Diagnosis not present

## 2015-04-11 DIAGNOSIS — I129 Hypertensive chronic kidney disease with stage 1 through stage 4 chronic kidney disease, or unspecified chronic kidney disease: Secondary | ICD-10-CM | POA: Diagnosis not present

## 2015-04-11 DIAGNOSIS — E785 Hyperlipidemia, unspecified: Secondary | ICD-10-CM | POA: Diagnosis not present

## 2015-04-14 DIAGNOSIS — I5030 Unspecified diastolic (congestive) heart failure: Secondary | ICD-10-CM | POA: Diagnosis not present

## 2015-04-14 DIAGNOSIS — I890 Lymphedema, not elsewhere classified: Secondary | ICD-10-CM | POA: Diagnosis not present

## 2015-04-14 DIAGNOSIS — E785 Hyperlipidemia, unspecified: Secondary | ICD-10-CM | POA: Diagnosis not present

## 2015-04-14 DIAGNOSIS — E1129 Type 2 diabetes mellitus with other diabetic kidney complication: Secondary | ICD-10-CM | POA: Diagnosis not present

## 2015-04-14 DIAGNOSIS — I129 Hypertensive chronic kidney disease with stage 1 through stage 4 chronic kidney disease, or unspecified chronic kidney disease: Secondary | ICD-10-CM | POA: Diagnosis not present

## 2015-04-14 DIAGNOSIS — N184 Chronic kidney disease, stage 4 (severe): Secondary | ICD-10-CM | POA: Diagnosis not present

## 2015-04-15 DIAGNOSIS — I129 Hypertensive chronic kidney disease with stage 1 through stage 4 chronic kidney disease, or unspecified chronic kidney disease: Secondary | ICD-10-CM | POA: Diagnosis not present

## 2015-04-16 ENCOUNTER — Encounter (HOSPITAL_COMMUNITY)
Admission: RE | Admit: 2015-04-16 | Discharge: 2015-04-16 | Disposition: A | Discharge status: Home / Self Care | Payer: Medicare Other | Source: Ambulatory Visit | Attending: Nephrology | Admitting: Nephrology

## 2015-04-16 DIAGNOSIS — N183 Chronic kidney disease, stage 3 unspecified: Secondary | ICD-10-CM

## 2015-04-16 LAB — IRON AND TIBC
IRON: 43 ug/dL — AB (ref 45–182)
Saturation Ratios: 19 % (ref 17.9–39.5)
TIBC: 223 ug/dL — AB (ref 250–450)
UIBC: 180 ug/dL

## 2015-04-16 LAB — FERRITIN: Ferritin: 452 ng/mL — ABNORMAL HIGH (ref 24–336)

## 2015-04-16 LAB — POCT HEMOGLOBIN-HEMACUE: Hemoglobin: 9.7 g/dL — ABNORMAL LOW (ref 13.0–17.0)

## 2015-04-16 MED ORDER — DARBEPOETIN ALFA 200 MCG/0.4ML IJ SOSY
160.0000 ug | PREFILLED_SYRINGE | INTRAMUSCULAR | Status: DC
  Administered 2015-04-16: 160 ug via SUBCUTANEOUS

## 2015-04-16 MED ORDER — DARBEPOETIN ALFA 60 MCG/0.3ML IJ SOSY
PREFILLED_SYRINGE | INTRAMUSCULAR | Status: AC
  Filled 2015-04-16: qty 0.3

## 2015-04-16 MED ORDER — DARBEPOETIN ALFA 100 MCG/0.5ML IJ SOSY
PREFILLED_SYRINGE | INTRAMUSCULAR | Status: AC
  Filled 2015-04-16: qty 0.5

## 2015-04-17 DIAGNOSIS — I129 Hypertensive chronic kidney disease with stage 1 through stage 4 chronic kidney disease, or unspecified chronic kidney disease: Secondary | ICD-10-CM | POA: Diagnosis not present

## 2015-04-17 DIAGNOSIS — I890 Lymphedema, not elsewhere classified: Secondary | ICD-10-CM | POA: Diagnosis not present

## 2015-04-17 DIAGNOSIS — E785 Hyperlipidemia, unspecified: Secondary | ICD-10-CM | POA: Diagnosis not present

## 2015-04-17 DIAGNOSIS — E1129 Type 2 diabetes mellitus with other diabetic kidney complication: Secondary | ICD-10-CM | POA: Diagnosis not present

## 2015-04-17 DIAGNOSIS — N184 Chronic kidney disease, stage 4 (severe): Secondary | ICD-10-CM | POA: Diagnosis not present

## 2015-04-17 DIAGNOSIS — I5030 Unspecified diastolic (congestive) heart failure: Secondary | ICD-10-CM | POA: Diagnosis not present

## 2015-04-17 MED FILL — Darbepoetin Alfa Soln Prefilled Syringe 100 MCG/0.5ML: INTRAMUSCULAR | Qty: 0.5 | Status: AC

## 2015-04-17 MED FILL — Darbepoetin Alfa Soln Prefilled Syringe 60 MCG/0.3ML: INTRAMUSCULAR | Qty: 0.3 | Status: AC

## 2015-04-21 DIAGNOSIS — E785 Hyperlipidemia, unspecified: Secondary | ICD-10-CM | POA: Diagnosis not present

## 2015-04-21 DIAGNOSIS — I129 Hypertensive chronic kidney disease with stage 1 through stage 4 chronic kidney disease, or unspecified chronic kidney disease: Secondary | ICD-10-CM | POA: Diagnosis not present

## 2015-04-21 DIAGNOSIS — E1129 Type 2 diabetes mellitus with other diabetic kidney complication: Secondary | ICD-10-CM | POA: Diagnosis not present

## 2015-04-21 DIAGNOSIS — I890 Lymphedema, not elsewhere classified: Secondary | ICD-10-CM | POA: Diagnosis not present

## 2015-04-21 DIAGNOSIS — I5030 Unspecified diastolic (congestive) heart failure: Secondary | ICD-10-CM | POA: Diagnosis not present

## 2015-04-21 DIAGNOSIS — N184 Chronic kidney disease, stage 4 (severe): Secondary | ICD-10-CM | POA: Diagnosis not present

## 2015-04-22 ENCOUNTER — Encounter (HOSPITAL_BASED_OUTPATIENT_CLINIC_OR_DEPARTMENT_OTHER): Payer: Medicare Other | Attending: Surgery

## 2015-04-22 DIAGNOSIS — Z7982 Long term (current) use of aspirin: Secondary | ICD-10-CM | POA: Insufficient documentation

## 2015-04-22 DIAGNOSIS — Z9119 Patient's noncompliance with other medical treatment and regimen: Secondary | ICD-10-CM | POA: Diagnosis not present

## 2015-04-22 DIAGNOSIS — I890 Lymphedema, not elsewhere classified: Secondary | ICD-10-CM | POA: Diagnosis not present

## 2015-04-22 DIAGNOSIS — I129 Hypertensive chronic kidney disease with stage 1 through stage 4 chronic kidney disease, or unspecified chronic kidney disease: Secondary | ICD-10-CM | POA: Insufficient documentation

## 2015-04-22 DIAGNOSIS — E11622 Type 2 diabetes mellitus with other skin ulcer: Secondary | ICD-10-CM | POA: Insufficient documentation

## 2015-04-22 DIAGNOSIS — Z86718 Personal history of other venous thrombosis and embolism: Secondary | ICD-10-CM | POA: Insufficient documentation

## 2015-04-22 DIAGNOSIS — L97919 Non-pressure chronic ulcer of unspecified part of right lower leg with unspecified severity: Secondary | ICD-10-CM | POA: Insufficient documentation

## 2015-04-22 DIAGNOSIS — I5032 Chronic diastolic (congestive) heart failure: Secondary | ICD-10-CM | POA: Diagnosis not present

## 2015-04-22 DIAGNOSIS — I87311 Chronic venous hypertension (idiopathic) with ulcer of right lower extremity: Secondary | ICD-10-CM | POA: Insufficient documentation

## 2015-04-22 DIAGNOSIS — N189 Chronic kidney disease, unspecified: Secondary | ICD-10-CM | POA: Insufficient documentation

## 2015-04-22 DIAGNOSIS — Z87891 Personal history of nicotine dependence: Secondary | ICD-10-CM | POA: Diagnosis not present

## 2015-04-22 DIAGNOSIS — L97811 Non-pressure chronic ulcer of other part of right lower leg limited to breakdown of skin: Secondary | ICD-10-CM | POA: Diagnosis not present

## 2015-04-22 DIAGNOSIS — E785 Hyperlipidemia, unspecified: Secondary | ICD-10-CM | POA: Diagnosis not present

## 2015-04-22 DIAGNOSIS — E1021 Type 1 diabetes mellitus with diabetic nephropathy: Secondary | ICD-10-CM | POA: Insufficient documentation

## 2015-04-22 DIAGNOSIS — I87312 Chronic venous hypertension (idiopathic) with ulcer of left lower extremity: Secondary | ICD-10-CM | POA: Insufficient documentation

## 2015-04-22 DIAGNOSIS — L97911 Non-pressure chronic ulcer of unspecified part of right lower leg limited to breakdown of skin: Secondary | ICD-10-CM | POA: Insufficient documentation

## 2015-04-28 DIAGNOSIS — I129 Hypertensive chronic kidney disease with stage 1 through stage 4 chronic kidney disease, or unspecified chronic kidney disease: Secondary | ICD-10-CM | POA: Diagnosis not present

## 2015-04-28 DIAGNOSIS — E785 Hyperlipidemia, unspecified: Secondary | ICD-10-CM | POA: Diagnosis not present

## 2015-04-28 DIAGNOSIS — N184 Chronic kidney disease, stage 4 (severe): Secondary | ICD-10-CM | POA: Diagnosis not present

## 2015-04-28 DIAGNOSIS — I5030 Unspecified diastolic (congestive) heart failure: Secondary | ICD-10-CM | POA: Diagnosis not present

## 2015-04-28 DIAGNOSIS — I890 Lymphedema, not elsewhere classified: Secondary | ICD-10-CM | POA: Diagnosis not present

## 2015-04-28 DIAGNOSIS — E1129 Type 2 diabetes mellitus with other diabetic kidney complication: Secondary | ICD-10-CM | POA: Diagnosis not present

## 2015-04-29 ENCOUNTER — Other Ambulatory Visit (HOSPITAL_COMMUNITY): Payer: Self-pay

## 2015-04-29 DIAGNOSIS — Z86718 Personal history of other venous thrombosis and embolism: Secondary | ICD-10-CM | POA: Diagnosis not present

## 2015-04-29 DIAGNOSIS — I87312 Chronic venous hypertension (idiopathic) with ulcer of left lower extremity: Secondary | ICD-10-CM | POA: Diagnosis not present

## 2015-04-29 DIAGNOSIS — Z9119 Patient's noncompliance with other medical treatment and regimen: Secondary | ICD-10-CM | POA: Diagnosis not present

## 2015-04-29 DIAGNOSIS — E785 Hyperlipidemia, unspecified: Secondary | ICD-10-CM | POA: Diagnosis not present

## 2015-04-29 DIAGNOSIS — I129 Hypertensive chronic kidney disease with stage 1 through stage 4 chronic kidney disease, or unspecified chronic kidney disease: Secondary | ICD-10-CM | POA: Diagnosis not present

## 2015-04-29 DIAGNOSIS — Z7982 Long term (current) use of aspirin: Secondary | ICD-10-CM | POA: Diagnosis not present

## 2015-04-29 DIAGNOSIS — I890 Lymphedema, not elsewhere classified: Secondary | ICD-10-CM | POA: Diagnosis not present

## 2015-04-29 DIAGNOSIS — I5032 Chronic diastolic (congestive) heart failure: Secondary | ICD-10-CM | POA: Diagnosis not present

## 2015-04-29 DIAGNOSIS — I87311 Chronic venous hypertension (idiopathic) with ulcer of right lower extremity: Secondary | ICD-10-CM | POA: Diagnosis not present

## 2015-04-29 DIAGNOSIS — E11622 Type 2 diabetes mellitus with other skin ulcer: Secondary | ICD-10-CM | POA: Diagnosis not present

## 2015-04-29 DIAGNOSIS — N189 Chronic kidney disease, unspecified: Secondary | ICD-10-CM | POA: Diagnosis not present

## 2015-04-29 DIAGNOSIS — L97811 Non-pressure chronic ulcer of other part of right lower leg limited to breakdown of skin: Secondary | ICD-10-CM | POA: Diagnosis not present

## 2015-04-29 DIAGNOSIS — E1021 Type 1 diabetes mellitus with diabetic nephropathy: Secondary | ICD-10-CM | POA: Diagnosis not present

## 2015-04-29 DIAGNOSIS — L97911 Non-pressure chronic ulcer of unspecified part of right lower leg limited to breakdown of skin: Secondary | ICD-10-CM | POA: Diagnosis not present

## 2015-04-29 DIAGNOSIS — L97919 Non-pressure chronic ulcer of unspecified part of right lower leg with unspecified severity: Secondary | ICD-10-CM | POA: Diagnosis not present

## 2015-04-29 DIAGNOSIS — Z87891 Personal history of nicotine dependence: Secondary | ICD-10-CM | POA: Diagnosis not present

## 2015-04-29 LAB — GLUCOSE, CAPILLARY: Glucose-Capillary: 125 mg/dL — ABNORMAL HIGH (ref 65–99)

## 2015-04-30 ENCOUNTER — Encounter (HOSPITAL_COMMUNITY)
Admission: RE | Admit: 2015-04-30 | Discharge: 2015-04-30 | Disposition: A | Discharge status: Home / Self Care | Payer: Medicare Other | Source: Ambulatory Visit | Attending: Nephrology | Admitting: Nephrology

## 2015-04-30 DIAGNOSIS — N183 Chronic kidney disease, stage 3 unspecified: Secondary | ICD-10-CM

## 2015-04-30 MED ORDER — SODIUM CHLORIDE 0.9 % IV SOLN
510.0000 mg | Freq: Once | INTRAVENOUS | Status: DC
Start: 2015-04-30 — End: 2015-05-01
  Filled 2015-04-30: qty 17

## 2015-04-30 MED ORDER — DARBEPOETIN ALFA 60 MCG/0.3ML IJ SOSY
PREFILLED_SYRINGE | INTRAMUSCULAR | Status: AC
  Administered 2015-04-30: 60 ug via SUBCUTANEOUS
  Filled 2015-04-30: qty 0.3

## 2015-04-30 MED ORDER — DARBEPOETIN ALFA 100 MCG/0.5ML IJ SOSY
PREFILLED_SYRINGE | INTRAMUSCULAR | Status: AC
  Administered 2015-04-30: 100 ug via SUBCUTANEOUS
  Filled 2015-04-30: qty 0.5

## 2015-04-30 MED ORDER — DARBEPOETIN ALFA 200 MCG/0.4ML IJ SOSY: 160.0000 ug | PREFILLED_SYRINGE | INTRAMUSCULAR | Status: DC

## 2015-04-30 NOTE — Progress Notes (Signed)
Attempted IV access x 3 without success; IV Therapy attempted x 5 without success; spoke with Crystal at [employer]

## 2015-05-01 LAB — POCT HEMOGLOBIN-HEMACUE: Hemoglobin: 8.9 g/dL — ABNORMAL LOW (ref 13.0–17.0)

## 2015-05-06 ENCOUNTER — Other Ambulatory Visit (HOSPITAL_COMMUNITY): Payer: Self-pay | Admitting: *Deleted

## 2015-05-06 DIAGNOSIS — I5032 Chronic diastolic (congestive) heart failure: Secondary | ICD-10-CM | POA: Diagnosis not present

## 2015-05-06 DIAGNOSIS — L97911 Non-pressure chronic ulcer of unspecified part of right lower leg limited to breakdown of skin: Secondary | ICD-10-CM | POA: Diagnosis not present

## 2015-05-06 DIAGNOSIS — I87311 Chronic venous hypertension (idiopathic) with ulcer of right lower extremity: Secondary | ICD-10-CM | POA: Diagnosis not present

## 2015-05-06 DIAGNOSIS — Z9119 Patient's noncompliance with other medical treatment and regimen: Secondary | ICD-10-CM | POA: Diagnosis not present

## 2015-05-06 DIAGNOSIS — N189 Chronic kidney disease, unspecified: Secondary | ICD-10-CM | POA: Diagnosis not present

## 2015-05-06 DIAGNOSIS — I87302 Chronic venous hypertension (idiopathic) without complications of left lower extremity: Secondary | ICD-10-CM | POA: Diagnosis not present

## 2015-05-06 DIAGNOSIS — I129 Hypertensive chronic kidney disease with stage 1 through stage 4 chronic kidney disease, or unspecified chronic kidney disease: Secondary | ICD-10-CM | POA: Diagnosis not present

## 2015-05-06 DIAGNOSIS — I87312 Chronic venous hypertension (idiopathic) with ulcer of left lower extremity: Secondary | ICD-10-CM | POA: Diagnosis not present

## 2015-05-06 DIAGNOSIS — E1021 Type 1 diabetes mellitus with diabetic nephropathy: Secondary | ICD-10-CM | POA: Diagnosis not present

## 2015-05-06 DIAGNOSIS — Z7982 Long term (current) use of aspirin: Secondary | ICD-10-CM | POA: Diagnosis not present

## 2015-05-06 DIAGNOSIS — E11622 Type 2 diabetes mellitus with other skin ulcer: Secondary | ICD-10-CM | POA: Diagnosis not present

## 2015-05-06 DIAGNOSIS — Z87891 Personal history of nicotine dependence: Secondary | ICD-10-CM | POA: Diagnosis not present

## 2015-05-06 DIAGNOSIS — Z86718 Personal history of other venous thrombosis and embolism: Secondary | ICD-10-CM | POA: Diagnosis not present

## 2015-05-06 DIAGNOSIS — L97919 Non-pressure chronic ulcer of unspecified part of right lower leg with unspecified severity: Secondary | ICD-10-CM | POA: Diagnosis not present

## 2015-05-06 DIAGNOSIS — E785 Hyperlipidemia, unspecified: Secondary | ICD-10-CM | POA: Diagnosis not present

## 2015-05-06 DIAGNOSIS — I890 Lymphedema, not elsewhere classified: Secondary | ICD-10-CM | POA: Diagnosis not present

## 2015-05-06 LAB — GLUCOSE, CAPILLARY: GLUCOSE-CAPILLARY: 91 mg/dL (ref 65–99)

## 2015-05-07 ENCOUNTER — Encounter (HOSPITAL_COMMUNITY)
Admission: RE | Admit: 2015-05-07 | Discharge: 2015-05-07 | Disposition: A | Discharge status: Home / Self Care | Payer: Medicare Other | Source: Ambulatory Visit | Attending: Nephrology | Admitting: Nephrology

## 2015-05-07 DIAGNOSIS — N183 Chronic kidney disease, stage 3 unspecified: Secondary | ICD-10-CM

## 2015-05-07 MED ORDER — SODIUM CHLORIDE 0.9 % IV SOLN
510.0000 mg | Freq: Once | INTRAVENOUS | Status: AC
  Administered 2015-05-07: 510 mg via INTRAVENOUS
  Filled 2015-05-07: qty 17

## 2015-05-07 MED ORDER — DARBEPOETIN ALFA 200 MCG/0.4ML IJ SOSY: 160.0000 ug | PREFILLED_SYRINGE | INTRAMUSCULAR | Status: DC

## 2015-05-13 ENCOUNTER — Encounter (HOSPITAL_BASED_OUTPATIENT_CLINIC_OR_DEPARTMENT_OTHER): Payer: Medicare Other | Attending: Surgery

## 2015-05-13 DIAGNOSIS — L97311 Non-pressure chronic ulcer of right ankle limited to breakdown of skin: Secondary | ICD-10-CM | POA: Diagnosis not present

## 2015-05-13 DIAGNOSIS — E785 Hyperlipidemia, unspecified: Secondary | ICD-10-CM | POA: Insufficient documentation

## 2015-05-13 DIAGNOSIS — D696 Thrombocytopenia, unspecified: Secondary | ICD-10-CM | POA: Insufficient documentation

## 2015-05-13 DIAGNOSIS — I5032 Chronic diastolic (congestive) heart failure: Secondary | ICD-10-CM | POA: Insufficient documentation

## 2015-05-13 DIAGNOSIS — I129 Hypertensive chronic kidney disease with stage 1 through stage 4 chronic kidney disease, or unspecified chronic kidney disease: Secondary | ICD-10-CM | POA: Insufficient documentation

## 2015-05-13 DIAGNOSIS — E1122 Type 2 diabetes mellitus with diabetic chronic kidney disease: Secondary | ICD-10-CM | POA: Insufficient documentation

## 2015-05-13 DIAGNOSIS — L97811 Non-pressure chronic ulcer of other part of right lower leg limited to breakdown of skin: Secondary | ICD-10-CM | POA: Insufficient documentation

## 2015-05-13 DIAGNOSIS — E11622 Type 2 diabetes mellitus with other skin ulcer: Secondary | ICD-10-CM | POA: Diagnosis not present

## 2015-05-13 DIAGNOSIS — I890 Lymphedema, not elsewhere classified: Secondary | ICD-10-CM | POA: Insufficient documentation

## 2015-05-13 DIAGNOSIS — I872 Venous insufficiency (chronic) (peripheral): Secondary | ICD-10-CM | POA: Insufficient documentation

## 2015-05-13 DIAGNOSIS — D472 Monoclonal gammopathy: Secondary | ICD-10-CM | POA: Diagnosis not present

## 2015-05-13 DIAGNOSIS — N189 Chronic kidney disease, unspecified: Secondary | ICD-10-CM | POA: Diagnosis not present

## 2015-05-13 DIAGNOSIS — I87312 Chronic venous hypertension (idiopathic) with ulcer of left lower extremity: Secondary | ICD-10-CM | POA: Diagnosis not present

## 2015-05-13 LAB — GLUCOSE, CAPILLARY: Glucose-Capillary: 130 mg/dL — ABNORMAL HIGH (ref 65–99)

## 2015-05-14 ENCOUNTER — Encounter (HOSPITAL_COMMUNITY)
Admission: RE | Admit: 2015-05-14 | Discharge: 2015-05-14 | Disposition: A | Discharge status: Home / Self Care | Payer: Medicare Other | Source: Ambulatory Visit | Attending: Nephrology | Admitting: Nephrology

## 2015-05-14 DIAGNOSIS — N183 Chronic kidney disease, stage 3 unspecified: Secondary | ICD-10-CM

## 2015-05-14 LAB — IRON AND TIBC
Iron: 53 ug/dL (ref 45–182)
Saturation Ratios: 24 % (ref 17.9–39.5)
TIBC: 223 ug/dL — ABNORMAL LOW (ref 250–450)
UIBC: 170 ug/dL

## 2015-05-14 LAB — FERRITIN: Ferritin: 386 ng/mL — ABNORMAL HIGH (ref 24–336)

## 2015-05-14 LAB — POCT HEMOGLOBIN-HEMACUE: HEMOGLOBIN: 9.4 g/dL — AB (ref 13.0–17.0)

## 2015-05-14 MED ORDER — DARBEPOETIN ALFA 200 MCG/0.4ML IJ SOSY: 160.0000 ug | PREFILLED_SYRINGE | INTRAMUSCULAR | Status: DC

## 2015-05-14 MED ORDER — DARBEPOETIN ALFA 100 MCG/0.5ML IJ SOSY
PREFILLED_SYRINGE | INTRAMUSCULAR | Status: AC
  Administered 2015-05-14: 100 ug via SUBCUTANEOUS
  Filled 2015-05-14: qty 0.5

## 2015-05-14 MED ORDER — DARBEPOETIN ALFA 60 MCG/0.3ML IJ SOSY
PREFILLED_SYRINGE | INTRAMUSCULAR | Status: AC
  Administered 2015-05-14: 60 ug via SUBCUTANEOUS
  Filled 2015-05-14: qty 0.3

## 2015-05-20 DIAGNOSIS — E1122 Type 2 diabetes mellitus with diabetic chronic kidney disease: Secondary | ICD-10-CM | POA: Diagnosis not present

## 2015-05-20 DIAGNOSIS — E11622 Type 2 diabetes mellitus with other skin ulcer: Secondary | ICD-10-CM | POA: Diagnosis not present

## 2015-05-20 DIAGNOSIS — I87312 Chronic venous hypertension (idiopathic) with ulcer of left lower extremity: Secondary | ICD-10-CM | POA: Diagnosis not present

## 2015-05-20 DIAGNOSIS — I87302 Chronic venous hypertension (idiopathic) without complications of left lower extremity: Secondary | ICD-10-CM | POA: Diagnosis not present

## 2015-05-20 DIAGNOSIS — I872 Venous insufficiency (chronic) (peripheral): Secondary | ICD-10-CM | POA: Diagnosis not present

## 2015-05-20 DIAGNOSIS — E785 Hyperlipidemia, unspecified: Secondary | ICD-10-CM | POA: Diagnosis not present

## 2015-05-20 DIAGNOSIS — I890 Lymphedema, not elsewhere classified: Secondary | ICD-10-CM | POA: Diagnosis not present

## 2015-05-20 DIAGNOSIS — I129 Hypertensive chronic kidney disease with stage 1 through stage 4 chronic kidney disease, or unspecified chronic kidney disease: Secondary | ICD-10-CM | POA: Diagnosis not present

## 2015-05-20 DIAGNOSIS — D472 Monoclonal gammopathy: Secondary | ICD-10-CM | POA: Diagnosis not present

## 2015-05-20 DIAGNOSIS — D696 Thrombocytopenia, unspecified: Secondary | ICD-10-CM | POA: Diagnosis not present

## 2015-05-20 DIAGNOSIS — L97811 Non-pressure chronic ulcer of other part of right lower leg limited to breakdown of skin: Secondary | ICD-10-CM | POA: Diagnosis not present

## 2015-05-20 DIAGNOSIS — I5032 Chronic diastolic (congestive) heart failure: Secondary | ICD-10-CM | POA: Diagnosis not present

## 2015-05-20 DIAGNOSIS — L97311 Non-pressure chronic ulcer of right ankle limited to breakdown of skin: Secondary | ICD-10-CM | POA: Diagnosis not present

## 2015-05-20 DIAGNOSIS — N189 Chronic kidney disease, unspecified: Secondary | ICD-10-CM | POA: Diagnosis not present

## 2015-05-20 LAB — GLUCOSE, CAPILLARY: GLUCOSE-CAPILLARY: 97 mg/dL (ref 65–99)

## 2015-05-22 DIAGNOSIS — S81802A Unspecified open wound, left lower leg, initial encounter: Secondary | ICD-10-CM | POA: Diagnosis not present

## 2015-05-22 DIAGNOSIS — S91001A Unspecified open wound, right ankle, initial encounter: Secondary | ICD-10-CM | POA: Diagnosis not present

## 2015-05-25 ENCOUNTER — Other Ambulatory Visit (HOSPITAL_COMMUNITY): Payer: Self-pay | Admitting: *Deleted

## 2015-05-26 ENCOUNTER — Other Ambulatory Visit: Payer: Self-pay | Admitting: *Deleted

## 2015-05-26 DIAGNOSIS — R609 Edema, unspecified: Secondary | ICD-10-CM

## 2015-05-27 DIAGNOSIS — D696 Thrombocytopenia, unspecified: Secondary | ICD-10-CM | POA: Diagnosis not present

## 2015-05-27 DIAGNOSIS — N189 Chronic kidney disease, unspecified: Secondary | ICD-10-CM | POA: Diagnosis not present

## 2015-05-27 DIAGNOSIS — E1122 Type 2 diabetes mellitus with diabetic chronic kidney disease: Secondary | ICD-10-CM | POA: Diagnosis not present

## 2015-05-27 DIAGNOSIS — E785 Hyperlipidemia, unspecified: Secondary | ICD-10-CM | POA: Diagnosis not present

## 2015-05-27 DIAGNOSIS — L97311 Non-pressure chronic ulcer of right ankle limited to breakdown of skin: Secondary | ICD-10-CM | POA: Diagnosis not present

## 2015-05-27 DIAGNOSIS — E11622 Type 2 diabetes mellitus with other skin ulcer: Secondary | ICD-10-CM | POA: Diagnosis not present

## 2015-05-27 DIAGNOSIS — L97811 Non-pressure chronic ulcer of other part of right lower leg limited to breakdown of skin: Secondary | ICD-10-CM | POA: Diagnosis not present

## 2015-05-27 DIAGNOSIS — I890 Lymphedema, not elsewhere classified: Secondary | ICD-10-CM | POA: Diagnosis not present

## 2015-05-27 DIAGNOSIS — I87312 Chronic venous hypertension (idiopathic) with ulcer of left lower extremity: Secondary | ICD-10-CM | POA: Diagnosis not present

## 2015-05-27 DIAGNOSIS — I5032 Chronic diastolic (congestive) heart failure: Secondary | ICD-10-CM | POA: Diagnosis not present

## 2015-05-27 DIAGNOSIS — I129 Hypertensive chronic kidney disease with stage 1 through stage 4 chronic kidney disease, or unspecified chronic kidney disease: Secondary | ICD-10-CM | POA: Diagnosis not present

## 2015-05-27 DIAGNOSIS — D472 Monoclonal gammopathy: Secondary | ICD-10-CM | POA: Diagnosis not present

## 2015-05-27 DIAGNOSIS — I872 Venous insufficiency (chronic) (peripheral): Secondary | ICD-10-CM | POA: Diagnosis not present

## 2015-05-27 DIAGNOSIS — L97821 Non-pressure chronic ulcer of other part of left lower leg limited to breakdown of skin: Secondary | ICD-10-CM | POA: Diagnosis not present

## 2015-05-28 ENCOUNTER — Encounter (HOSPITAL_COMMUNITY)
Admission: RE | Admit: 2015-05-28 | Discharge: 2015-05-28 | Disposition: A | Discharge status: Home / Self Care | Payer: Medicare Other | Source: Ambulatory Visit | Attending: Nephrology | Admitting: Nephrology

## 2015-05-28 DIAGNOSIS — N183 Chronic kidney disease, stage 3 unspecified: Secondary | ICD-10-CM

## 2015-05-28 LAB — POCT HEMOGLOBIN-HEMACUE: HEMOGLOBIN: 8.8 g/dL — AB (ref 13.0–17.0)

## 2015-05-28 MED ORDER — DARBEPOETIN ALFA 60 MCG/0.3ML IJ SOSY
PREFILLED_SYRINGE | INTRAMUSCULAR | Status: AC
  Administered 2015-05-28: 60 ug
  Filled 2015-05-28: qty 0.3

## 2015-05-28 MED ORDER — DARBEPOETIN ALFA 100 MCG/0.5ML IJ SOSY
PREFILLED_SYRINGE | INTRAMUSCULAR | Status: AC
  Administered 2015-05-28: 100 ug
  Filled 2015-05-28: qty 0.5

## 2015-05-28 MED ORDER — SODIUM CHLORIDE 0.9 % IV SOLN
510.0000 mg | Freq: Once | INTRAVENOUS | Status: AC
  Administered 2015-05-28: 510 mg via INTRAVENOUS
  Filled 2015-05-28: qty 17

## 2015-05-28 MED ORDER — DARBEPOETIN ALFA 200 MCG/0.4ML IJ SOSY: 160.0000 ug | PREFILLED_SYRINGE | INTRAMUSCULAR | Status: DC

## 2015-06-01 MED FILL — Darbepoetin Alfa Soln Prefilled Syringe 100 MCG/0.5ML: INTRAMUSCULAR | Qty: 0.5 | Status: AC

## 2015-06-10 DIAGNOSIS — I872 Venous insufficiency (chronic) (peripheral): Secondary | ICD-10-CM | POA: Diagnosis not present

## 2015-06-10 LAB — GLUCOSE, CAPILLARY: Glucose-Capillary: 85 mg/dL (ref 65–99)

## 2015-06-11 ENCOUNTER — Encounter (HOSPITAL_COMMUNITY)
Admission: RE | Admit: 2015-06-11 | Discharge: 2015-06-11 | Disposition: A | Discharge status: Home / Self Care | Payer: Medicare Other | Source: Ambulatory Visit | Attending: Nephrology | Admitting: Nephrology

## 2015-06-11 DIAGNOSIS — N183 Chronic kidney disease, stage 3 unspecified: Secondary | ICD-10-CM

## 2015-06-11 DIAGNOSIS — N185 Chronic kidney disease, stage 5: Secondary | ICD-10-CM | POA: Insufficient documentation

## 2015-06-11 DIAGNOSIS — D631 Anemia in chronic kidney disease: Secondary | ICD-10-CM | POA: Insufficient documentation

## 2015-06-11 DIAGNOSIS — Z5181 Encounter for therapeutic drug level monitoring: Secondary | ICD-10-CM | POA: Insufficient documentation

## 2015-06-11 DIAGNOSIS — Z79899 Other long term (current) drug therapy: Secondary | ICD-10-CM | POA: Insufficient documentation

## 2015-06-11 LAB — POCT HEMOGLOBIN-HEMACUE: Hemoglobin: 10 g/dL — ABNORMAL LOW (ref 13.0–17.0)

## 2015-06-11 MED ORDER — DARBEPOETIN ALFA 200 MCG/0.4ML IJ SOSY
200.0000 ug | PREFILLED_SYRINGE | INTRAMUSCULAR | Status: DC
  Administered 2015-06-11: 200 ug via SUBCUTANEOUS

## 2015-06-11 MED ORDER — DARBEPOETIN ALFA 200 MCG/0.4ML IJ SOSY
PREFILLED_SYRINGE | INTRAMUSCULAR | Status: AC
  Filled 2015-06-11: qty 0.4

## 2015-06-12 DIAGNOSIS — D631 Anemia in chronic kidney disease: Secondary | ICD-10-CM | POA: Diagnosis not present

## 2015-06-12 DIAGNOSIS — M1A9XX1 Chronic gout, unspecified, with tophus (tophi): Secondary | ICD-10-CM | POA: Diagnosis not present

## 2015-06-12 DIAGNOSIS — I129 Hypertensive chronic kidney disease with stage 1 through stage 4 chronic kidney disease, or unspecified chronic kidney disease: Secondary | ICD-10-CM | POA: Diagnosis not present

## 2015-06-12 DIAGNOSIS — N185 Chronic kidney disease, stage 5: Secondary | ICD-10-CM | POA: Diagnosis not present

## 2015-06-12 DIAGNOSIS — E118 Type 2 diabetes mellitus with unspecified complications: Secondary | ICD-10-CM | POA: Diagnosis not present

## 2015-06-17 ENCOUNTER — Encounter (HOSPITAL_BASED_OUTPATIENT_CLINIC_OR_DEPARTMENT_OTHER): Payer: Medicare Other | Attending: Surgery

## 2015-06-17 DIAGNOSIS — I509 Heart failure, unspecified: Secondary | ICD-10-CM | POA: Diagnosis not present

## 2015-06-17 DIAGNOSIS — I87302 Chronic venous hypertension (idiopathic) without complications of left lower extremity: Secondary | ICD-10-CM | POA: Diagnosis not present

## 2015-06-17 DIAGNOSIS — N189 Chronic kidney disease, unspecified: Secondary | ICD-10-CM | POA: Diagnosis not present

## 2015-06-17 DIAGNOSIS — I120 Hypertensive chronic kidney disease with stage 5 chronic kidney disease or end stage renal disease: Secondary | ICD-10-CM | POA: Insufficient documentation

## 2015-06-17 DIAGNOSIS — I87312 Chronic venous hypertension (idiopathic) with ulcer of left lower extremity: Secondary | ICD-10-CM | POA: Insufficient documentation

## 2015-06-17 DIAGNOSIS — N186 End stage renal disease: Secondary | ICD-10-CM | POA: Diagnosis not present

## 2015-06-17 DIAGNOSIS — I890 Lymphedema, not elsewhere classified: Secondary | ICD-10-CM | POA: Insufficient documentation

## 2015-06-17 DIAGNOSIS — E11622 Type 2 diabetes mellitus with other skin ulcer: Secondary | ICD-10-CM | POA: Diagnosis not present

## 2015-06-18 LAB — GLUCOSE, CAPILLARY: Glucose-Capillary: 107 mg/dL — ABNORMAL HIGH (ref 65–99)

## 2015-06-23 ENCOUNTER — Inpatient Hospital Stay (HOSPITAL_COMMUNITY)
Admission: EM | Admit: 2015-06-23 | Discharge: 2015-06-30 | DRG: 628 | Disposition: A | Discharge status: Home / Self Care | Payer: Medicare Other | Attending: Family Medicine | Admitting: Family Medicine

## 2015-06-23 ENCOUNTER — Encounter (HOSPITAL_COMMUNITY): Payer: Self-pay | Admitting: Emergency Medicine

## 2015-06-23 ENCOUNTER — Emergency Department (HOSPITAL_COMMUNITY): Payer: Medicare Other

## 2015-06-23 DIAGNOSIS — I120 Hypertensive chronic kidney disease with stage 5 chronic kidney disease or end stage renal disease: Secondary | ICD-10-CM | POA: Diagnosis present

## 2015-06-23 DIAGNOSIS — F419 Anxiety disorder, unspecified: Secondary | ICD-10-CM | POA: Diagnosis present

## 2015-06-23 DIAGNOSIS — E1122 Type 2 diabetes mellitus with diabetic chronic kidney disease: Secondary | ICD-10-CM | POA: Diagnosis present

## 2015-06-23 DIAGNOSIS — Z89421 Acquired absence of other right toe(s): Secondary | ICD-10-CM

## 2015-06-23 DIAGNOSIS — I4891 Unspecified atrial fibrillation: Secondary | ICD-10-CM | POA: Diagnosis present

## 2015-06-23 DIAGNOSIS — D509 Iron deficiency anemia, unspecified: Secondary | ICD-10-CM | POA: Insufficient documentation

## 2015-06-23 DIAGNOSIS — L97909 Non-pressure chronic ulcer of unspecified part of unspecified lower leg with unspecified severity: Secondary | ICD-10-CM | POA: Diagnosis present

## 2015-06-23 DIAGNOSIS — R519 Headache, unspecified: Secondary | ICD-10-CM | POA: Insufficient documentation

## 2015-06-23 DIAGNOSIS — I5031 Acute diastolic (congestive) heart failure: Secondary | ICD-10-CM | POA: Diagnosis not present

## 2015-06-23 DIAGNOSIS — E8770 Fluid overload, unspecified: Principal | ICD-10-CM | POA: Diagnosis present

## 2015-06-23 DIAGNOSIS — N186 End stage renal disease: Secondary | ICD-10-CM | POA: Diagnosis present

## 2015-06-23 DIAGNOSIS — R0602 Shortness of breath: Secondary | ICD-10-CM | POA: Diagnosis present

## 2015-06-23 DIAGNOSIS — Y832 Surgical operation with anastomosis, bypass or graft as the cause of abnormal reaction of the patient, or of later complication, without mention of misadventure at the time of the procedure: Secondary | ICD-10-CM | POA: Diagnosis present

## 2015-06-23 DIAGNOSIS — M109 Gout, unspecified: Secondary | ICD-10-CM | POA: Diagnosis not present

## 2015-06-23 DIAGNOSIS — D472 Monoclonal gammopathy: Secondary | ICD-10-CM | POA: Diagnosis present

## 2015-06-23 DIAGNOSIS — I493 Ventricular premature depolarization: Secondary | ICD-10-CM | POA: Diagnosis not present

## 2015-06-23 DIAGNOSIS — T82318A Breakdown (mechanical) of other vascular grafts, initial encounter: Secondary | ICD-10-CM | POA: Diagnosis present

## 2015-06-23 DIAGNOSIS — J9600 Acute respiratory failure, unspecified whether with hypoxia or hypercapnia: Secondary | ICD-10-CM | POA: Diagnosis present

## 2015-06-23 DIAGNOSIS — I890 Lymphedema, not elsewhere classified: Secondary | ICD-10-CM

## 2015-06-23 DIAGNOSIS — I509 Heart failure, unspecified: Secondary | ICD-10-CM

## 2015-06-23 DIAGNOSIS — Z79899 Other long term (current) drug therapy: Secondary | ICD-10-CM

## 2015-06-23 DIAGNOSIS — I70219 Atherosclerosis of native arteries of extremities with intermittent claudication, unspecified extremity: Secondary | ICD-10-CM | POA: Diagnosis present

## 2015-06-23 DIAGNOSIS — D649 Anemia, unspecified: Secondary | ICD-10-CM | POA: Diagnosis present

## 2015-06-23 DIAGNOSIS — E118 Type 2 diabetes mellitus with unspecified complications: Secondary | ICD-10-CM | POA: Diagnosis present

## 2015-06-23 DIAGNOSIS — I739 Peripheral vascular disease, unspecified: Secondary | ICD-10-CM | POA: Diagnosis present

## 2015-06-23 DIAGNOSIS — E1129 Type 2 diabetes mellitus with other diabetic kidney complication: Secondary | ICD-10-CM

## 2015-06-23 DIAGNOSIS — L299 Pruritus, unspecified: Secondary | ICD-10-CM | POA: Insufficient documentation

## 2015-06-23 DIAGNOSIS — Z89422 Acquired absence of other left toe(s): Secondary | ICD-10-CM | POA: Diagnosis not present

## 2015-06-23 DIAGNOSIS — I5032 Chronic diastolic (congestive) heart failure: Secondary | ICD-10-CM | POA: Diagnosis present

## 2015-06-23 DIAGNOSIS — T82590D Other mechanical complication of surgically created arteriovenous fistula, subsequent encounter: Secondary | ICD-10-CM | POA: Diagnosis not present

## 2015-06-23 DIAGNOSIS — N289 Disorder of kidney and ureter, unspecified: Secondary | ICD-10-CM

## 2015-06-23 DIAGNOSIS — D688 Other specified coagulation defects: Secondary | ICD-10-CM | POA: Insufficient documentation

## 2015-06-23 DIAGNOSIS — T82898A Other specified complication of vascular prosthetic devices, implants and grafts, initial encounter: Secondary | ICD-10-CM | POA: Diagnosis not present

## 2015-06-23 DIAGNOSIS — Z7982 Long term (current) use of aspirin: Secondary | ICD-10-CM | POA: Diagnosis not present

## 2015-06-23 DIAGNOSIS — N2581 Secondary hyperparathyroidism of renal origin: Secondary | ICD-10-CM | POA: Insufficient documentation

## 2015-06-23 DIAGNOSIS — Z87891 Personal history of nicotine dependence: Secondary | ICD-10-CM

## 2015-06-23 DIAGNOSIS — Z992 Dependence on renal dialysis: Secondary | ICD-10-CM

## 2015-06-23 DIAGNOSIS — E1151 Type 2 diabetes mellitus with diabetic peripheral angiopathy without gangrene: Secondary | ICD-10-CM | POA: Insufficient documentation

## 2015-06-23 DIAGNOSIS — I872 Venous insufficiency (chronic) (peripheral): Secondary | ICD-10-CM | POA: Diagnosis present

## 2015-06-23 DIAGNOSIS — R52 Pain, unspecified: Secondary | ICD-10-CM | POA: Insufficient documentation

## 2015-06-23 DIAGNOSIS — E8779 Other fluid overload: Secondary | ICD-10-CM | POA: Diagnosis not present

## 2015-06-23 DIAGNOSIS — T82590A Other mechanical complication of surgically created arteriovenous fistula, initial encounter: Secondary | ICD-10-CM

## 2015-06-23 DIAGNOSIS — Q103 Other congenital malformations of eyelid: Secondary | ICD-10-CM

## 2015-06-23 LAB — GLUCOSE, CAPILLARY: GLUCOSE-CAPILLARY: 107 mg/dL — AB (ref 65–99)

## 2015-06-23 LAB — RENAL FUNCTION PANEL
Albumin: 3.7 g/dL (ref 3.5–5.0)
Anion gap: 11 (ref 5–15)
BUN: 68 mg/dL — AB (ref 6–20)
CO2: 25 mmol/L (ref 22–32)
CREATININE: 7.81 mg/dL — AB (ref 0.61–1.24)
Calcium: 8.2 mg/dL — ABNORMAL LOW (ref 8.9–10.3)
Chloride: 103 mmol/L (ref 101–111)
GFR calc Af Amer: 7 mL/min — ABNORMAL LOW (ref >60)
GFR, EST NON AFRICAN AMERICAN: 6 mL/min — AB (ref >60)
Glucose, Bld: 114 mg/dL — ABNORMAL HIGH (ref 65–99)
Phosphorus: 5.1 mg/dL — ABNORMAL HIGH (ref 2.5–4.6)
Potassium: 4.1 mmol/L (ref 3.5–5.1)
Sodium: 139 mmol/L (ref 135–145)

## 2015-06-23 LAB — BASIC METABOLIC PANEL
Anion gap: 13 (ref 5–15)
BUN: 67 mg/dL — ABNORMAL HIGH (ref 6–20)
CHLORIDE: 103 mmol/L (ref 101–111)
CO2: 25 mmol/L (ref 22–32)
Calcium: 8.3 mg/dL — ABNORMAL LOW (ref 8.9–10.3)
Creatinine, Ser: 7.69 mg/dL — ABNORMAL HIGH (ref 0.61–1.24)
GFR calc Af Amer: 8 mL/min — ABNORMAL LOW (ref >60)
GFR calc non Af Amer: 6 mL/min — ABNORMAL LOW (ref >60)
GLUCOSE: 116 mg/dL — AB (ref 65–99)
Potassium: 4.1 mmol/L (ref 3.5–5.1)
Sodium: 141 mmol/L (ref 135–145)

## 2015-06-23 LAB — CBC
HEMATOCRIT: 34.8 % — AB (ref 39.0–52.0)
HEMOGLOBIN: 11 g/dL — AB (ref 13.0–17.0)
MCH: 28.8 pg (ref 26.0–34.0)
MCHC: 31.6 g/dL (ref 30.0–36.0)
MCV: 91.1 fL (ref 78.0–100.0)
Platelets: 139 10*3/uL — ABNORMAL LOW (ref 150–400)
RBC: 3.82 MIL/uL — AB (ref 4.22–5.81)
RDW: 16.9 % — ABNORMAL HIGH (ref 11.5–15.5)
WBC: 11.5 10*3/uL — AB (ref 4.0–10.5)

## 2015-06-23 LAB — I-STAT TROPONIN, ED: TROPONIN I, POC: 0 ng/mL (ref 0.00–0.08)

## 2015-06-23 LAB — HEPATITIS B SURFACE ANTIGEN: Hepatitis B Surface Ag: NEGATIVE

## 2015-06-23 LAB — BRAIN NATRIURETIC PEPTIDE: B Natriuretic Peptide: 361.7 pg/mL — ABNORMAL HIGH (ref 0.0–100.0)

## 2015-06-23 MED ORDER — NEPRO/CARBSTEADY PO LIQD
237.0000 mL | ORAL | Status: DC | PRN
  Filled 2015-06-23: qty 237

## 2015-06-23 MED ORDER — LIDOCAINE HCL (PF) 1 % IJ SOLN: 5.0000 mL | INTRAMUSCULAR | Status: DC | PRN

## 2015-06-23 MED ORDER — SODIUM CHLORIDE 0.9 % IV SOLN: 100.0000 mL | INTRAVENOUS | Status: DC | PRN

## 2015-06-23 MED ORDER — ALTEPLASE 2 MG IJ SOLR
2.0000 mg | Freq: Once | INTRAMUSCULAR | Status: AC | PRN
  Filled 2015-06-23: qty 2

## 2015-06-23 MED ORDER — CARVEDILOL 25 MG PO TABS
25.0000 mg | ORAL_TABLET | Freq: Two times a day (BID) | ORAL | Status: DC
  Administered 2015-06-24 – 2015-06-30 (×12): 25 mg via ORAL
  Filled 2015-06-23 (×15): qty 1

## 2015-06-23 MED ORDER — ALTEPLASE 2 MG IJ SOLR: 2.0000 mg | Freq: Once | INTRAMUSCULAR | Status: DC | PRN

## 2015-06-23 MED ORDER — HEPARIN SODIUM (PORCINE) 5000 UNIT/ML IJ SOLN
5000.0000 [IU] | Freq: Three times a day (TID) | INTRAMUSCULAR | Status: DC
  Administered 2015-06-24: 5000 [IU] via SUBCUTANEOUS
  Filled 2015-06-23 (×4): qty 1

## 2015-06-23 MED ORDER — ALBUTEROL SULFATE (2.5 MG/3ML) 0.083% IN NEBU
5.0000 mg | INHALATION_SOLUTION | Freq: Once | RESPIRATORY_TRACT | Status: AC
  Administered 2015-06-23: 5 mg via RESPIRATORY_TRACT
  Filled 2015-06-23: qty 6

## 2015-06-23 MED ORDER — LIDOCAINE-PRILOCAINE 2.5-2.5 % EX CREA
1.0000 "application " | TOPICAL_CREAM | CUTANEOUS | Status: DC | PRN
  Filled 2015-06-23: qty 5

## 2015-06-23 MED ORDER — NEPRO/CARBSTEADY PO LIQD: 237.0000 mL | ORAL | Status: DC | PRN

## 2015-06-23 MED ORDER — FUROSEMIDE 80 MG PO TABS
80.0000 mg | ORAL_TABLET | Freq: Two times a day (BID) | ORAL | Status: DC
  Administered 2015-06-24: 80 mg via ORAL
  Filled 2015-06-23 (×3): qty 1

## 2015-06-23 MED ORDER — AMLODIPINE BESYLATE 10 MG PO TABS
10.0000 mg | ORAL_TABLET | Freq: Every morning | ORAL | Status: DC
  Filled 2015-06-23: qty 1

## 2015-06-23 MED ORDER — ASPIRIN EC 81 MG PO TBEC
81.0000 mg | DELAYED_RELEASE_TABLET | Freq: Every morning | ORAL | Status: DC
  Administered 2015-06-24 – 2015-06-30 (×6): 81 mg via ORAL
  Filled 2015-06-23 (×7): qty 1

## 2015-06-23 MED ORDER — CALCITRIOL 0.25 MCG PO CAPS
0.2500 ug | ORAL_CAPSULE | Freq: Every day | ORAL | Status: DC
  Administered 2015-06-24 – 2015-06-30 (×7): 0.25 ug via ORAL
  Filled 2015-06-23 (×8): qty 1

## 2015-06-23 MED ORDER — LIDOCAINE-PRILOCAINE 2.5-2.5 % EX CREA: 1.0000 "application " | TOPICAL_CREAM | CUTANEOUS | Status: DC | PRN

## 2015-06-23 MED ORDER — INSULIN ASPART 100 UNIT/ML ~~LOC~~ SOLN
0.0000 [IU] | Freq: Three times a day (TID) | SUBCUTANEOUS | Status: DC
  Administered 2015-06-29: 1 [IU] via SUBCUTANEOUS
  Administered 2015-06-29: 2 [IU] via SUBCUTANEOUS
  Administered 2015-06-30: 3 [IU] via SUBCUTANEOUS
  Administered 2015-06-30: 2 [IU] via SUBCUTANEOUS

## 2015-06-23 MED ORDER — ACETAMINOPHEN 650 MG RE SUPP: 650.0000 mg | Freq: Four times a day (QID) | RECTAL | Status: DC | PRN

## 2015-06-23 MED ORDER — PENTAFLUOROPROP-TETRAFLUOROETH EX AERO: 1.0000 "application " | INHALATION_SPRAY | CUTANEOUS | Status: DC | PRN

## 2015-06-23 MED ORDER — ATORVASTATIN CALCIUM 40 MG PO TABS
40.0000 mg | ORAL_TABLET | Freq: Every day | ORAL | Status: DC
  Administered 2015-06-24 – 2015-06-28 (×5): 40 mg via ORAL
  Filled 2015-06-23 (×7): qty 1

## 2015-06-23 MED ORDER — HEPARIN SODIUM (PORCINE) 1000 UNIT/ML DIALYSIS: 1000.0000 [IU] | INTRAMUSCULAR | Status: DC | PRN

## 2015-06-23 MED ORDER — HYDRALAZINE HCL 50 MG PO TABS
100.0000 mg | ORAL_TABLET | Freq: Two times a day (BID) | ORAL | Status: DC
  Administered 2015-06-24: 100 mg via ORAL
  Filled 2015-06-23 (×3): qty 2

## 2015-06-23 MED ORDER — HEPARIN SODIUM (PORCINE) 1000 UNIT/ML DIALYSIS: 3000.0000 [IU] | INTRAMUSCULAR | Status: DC | PRN

## 2015-06-23 MED ORDER — COLCHICINE 0.6 MG PO TABS
0.3000 mg | ORAL_TABLET | Freq: Every day | ORAL | Status: DC
  Administered 2015-06-24 – 2015-06-25 (×2): 0.3 mg via ORAL
  Filled 2015-06-23 (×2): qty 0.5

## 2015-06-23 MED ORDER — PENTAFLUOROPROP-TETRAFLUOROETH EX AERO
1.0000 "application " | INHALATION_SPRAY | CUTANEOUS | Status: DC | PRN
  Filled 2015-06-23: qty 30

## 2015-06-23 MED ORDER — PENTAFLUOROPROP-TETRAFLUOROETH EX AERO
INHALATION_SPRAY | CUTANEOUS | Status: AC
Start: 2015-06-23 — End: 2015-06-24
  Filled 2015-06-23: qty 103.5

## 2015-06-23 MED ORDER — ONDANSETRON 4 MG PO TBDP
4.0000 mg | ORAL_TABLET | Freq: Three times a day (TID) | ORAL | Status: DC | PRN
  Filled 2015-06-23: qty 1

## 2015-06-23 MED ORDER — SODIUM BICARBONATE 650 MG PO TABS
650.0000 mg | ORAL_TABLET | Freq: Three times a day (TID) | ORAL | Status: DC
  Administered 2015-06-24 (×4): 650 mg via ORAL
  Filled 2015-06-23 (×7): qty 1

## 2015-06-23 MED ORDER — HEPARIN SODIUM (PORCINE) 1000 UNIT/ML DIALYSIS
1000.0000 [IU] | INTRAMUSCULAR | Status: DC | PRN
  Filled 2015-06-23: qty 1

## 2015-06-23 MED ORDER — POLYETHYLENE GLYCOL 3350 17 G PO PACK
17.0000 g | PACK | Freq: Every day | ORAL | Status: DC | PRN
  Filled 2015-06-23: qty 1

## 2015-06-23 MED ORDER — HEPARIN SODIUM (PORCINE) 1000 UNIT/ML DIALYSIS
3000.0000 [IU] | INTRAMUSCULAR | Status: DC | PRN
  Filled 2015-06-23: qty 3

## 2015-06-23 MED ORDER — ACETAMINOPHEN 325 MG PO TABS
650.0000 mg | ORAL_TABLET | Freq: Four times a day (QID) | ORAL | Status: DC | PRN
  Administered 2015-06-25 – 2015-06-30 (×5): 650 mg via ORAL
  Filled 2015-06-23 (×5): qty 2

## 2015-06-23 MED ORDER — FUROSEMIDE 10 MG/ML IJ SOLN: 60.0000 mg | Freq: Once | INTRAMUSCULAR | Status: DC

## 2015-06-23 NOTE — H&P (Signed)
[facility] Admission History and Physical Service Pager: [phone]  Patient name: [name] Medical record number: [id number] Date of birth: [birth year] Age: 64 y.o. Gender: male  Primary Care Provider: Lucrezia Starch, MD, last saw [facility] in 2015, no new PCP Consultants: renal Code Status: full  Chief Complaint: SOB  Assessment and Plan: [name] is a 64 y.o. male presenting with SOB following first dialysis session earlier today. PMH is significant for HTN, CHF, DM2, ESRD and MGUS  Dyspnea/volume overload/CHF/ESRD: SOB following first dialysis session earlier to day, hypervolemic on exam, orthopnea and PND stable, CXR with pulm edema. No chest pain. Acute onset likely related to anxiety and discomfort following first dialysis session - admit to inpatient, Dr. Ree Kida attending - HD tonight and again tomorrow, feeling better already - renal consulting, appreciate assistance - last echo in [id number], grade 2 diastolic dysfunction, negative lexiscan at that time - update echo during hospitalization - continue home lasix pending renal recs for changing this - continue home CHF/HTN meds: coreg, hydral, amlodipine, lasix - continue home arinesp per renal and oral bicarb and calcitriol - ED EKG w/o acute changes but poor quality, trop neg, will repeat EKG in am  DM: Last A1c 6.6 last month, on glipizide only at home - hold home glipizide - sensitive SSI  Chronic venous insufficiency: gets weekly unna boot changes at wound care - continue unna boots - consult wound care  FEN/GI: renal/carb mod diet, SLIV Prophylaxis: SQ heparin  Disposition:   History of Present Illness: [name] is a 64 y.o. male presenting with acute onset shortness of breath following his first dialysis treatment today. He reports he always has swelling in his legs due to venous insufficiency and lymphedema. He also always has PND and orthopnea but these have not changed  recently. He has had some shortness of breath for the past 2 weeks but it got acutely worse today.   I saw him in dialysis and he reported already feeling better with getting fluid off. He also got albuterol in the ED which did not help  Review Of Systems: Per HPI with the following additions: No fever, cough, chest pain Otherwise 12 point review of systems was performed and was unremarkable.  Patient Active Problem List   Diagnosis Date Noted  . CHF (congestive heart failure) 06/23/2015  . Volume overload 06/23/2015  . Lymphedema distichiasis syndrome with kidney disease and diabetes mellitus 04/06/2015  . Chronic kidney disease   . Acute on chronic renal failure 04/04/2015  . Lymphedema of lower extremity 04/04/2015  . DM (diabetes mellitus), type 2 with renal complications [id number]  . MGUS (monoclonal gammopathy of unknown significance) 06/12/2014  . End stage renal disease 04/29/2014  . Stasis dermatitis 02/26/2014  . Diastolic CHF [id number]  . Special screening for malignant neoplasms, colon 07/09/2013  . Knee pain 08/21/2012  . Mass of thigh 08/21/2012  . ORGANIC IMPOTENCE 12/02/2009  . ONYCHOMYCOSIS 07/17/2009  . ESRD (end stage renal disease) on dialysis 06/09/2008  . VENOUS INSUFFICIENCY, LEGS 04/13/2007  . Hyperlipidemia LDL goal < 100 03/19/2007  . Diabetes mellitus type 2 with complications [id number]  . OBESITY, MORBID 02/08/2007  . HYPERTENSION, BENIGN SYSTEMIC 02/08/2007  . Atherosclerosis of native arteries of extremity with intermittent claudication 02/08/2007   Past Medical History: Past Medical History  Diagnosis Date  . Diabetes mellitus   . Hypertension   . Hyperlipidemia   . Chronic kidney disease   . PVD (peripheral vascular disease)   .  Venous insufficiency   . Morbid obesity   . Chronic cystitis   . Arthritis   . UMBILICAL HERNIA [id number]    Qualifier: History of  By: [name] MD, Audelia Acton    . NEPHROLITHIASIS, HX OF 12/02/2009    Qualifier:  Diagnosis of  By: [name], Cat    . Lymphedema   . CHF (congestive heart failure)   . Asthma     as a child  . H/O hiatal hernia     states it's been fixed  . Dry skin    Past Surgical History: Past Surgical History  Procedure Laterality Date  . R knee arthoscopic repair of meniscus    . Umbilical hernia repair    . Amputation      Right and left fifth toes.   . Popliteal to posterior tibial bypass      2006  . Eye surgery Bilateral     cataract and lens implant  . Colonoscopy    . Hernia repair    . Av fistula placement Left 03/21/2014    Procedure: ARTERIOVENOUS (AV) FISTULA CREATION with ultrasound;  Surgeon: [name], MD;  Location: [facility] OR;  Service: Vascular;  Laterality: Left;   Social History: History  Substance Use Topics  . Smoking status: Former Smoker    Quit date: 06/06/1979  . Smokeless tobacco: Former Systems developer  . Alcohol Use: No     Comment:   "years ago" , none now   Please also refer to relevant sections of EMR.  Family History: Family History  Problem Relation Age of Onset  . Diabetes Mother   . Heart disease Mother   . Diabetes Brother    Allergies and Medications: Allergies  Allergen Reactions  . Shrimp [Shellfish Allergy] Swelling  . Iodine Swelling   No current facility-administered medications on file prior to encounter.   Current Outpatient Prescriptions on File Prior to Encounter  Medication Sig Dispense Refill  . amLODipine (NORVASC) 10 MG tablet Take 10 mg by mouth every morning.    [name] aspirin EC 81 MG tablet Take 81 mg by mouth every morning.    [name] atorvastatin (LIPITOR) 40 MG tablet Take 1 tablet (40 mg total) by mouth daily at 6 PM. 30 tablet 11  . calcitRIOL (ROCALTROL) 0.25 MCG capsule Take 0.25 mcg by mouth daily.     . carvedilol (COREG) 25 MG tablet Take 1 tablet (25 mg total) by mouth 2 (two) times daily with a meal. 60 tablet 11  . colchicine 0.6 MG tablet Take 0.5 tablets (0.3 mg total) by mouth daily. 30 tablet 0  .  darbepoetin (ARANESP) 100 MCG/0.5ML SOLN injection Inject 100 mcg into the skin every 14 (fourteen) days. [facility].    . furosemide (LASIX) 80 MG tablet Take 80 mg by mouth 2 (two) times daily.    [name] glipiZIDE (GLUCOTROL) 10 MG tablet Take 5-10 mg by mouth daily before breakfast. Takes 1/2-1 tablet depending on sugar. Hold if sugar is less than 98    . hydrALAZINE (APRESOLINE) 100 MG tablet Take 1 tablet (100 mg total) by mouth 2 (two) times daily. take 1 tablet by mouth twice a day 180 tablet 3  . ondansetron (ZOFRAN-ODT) 4 MG disintegrating tablet Take 4 mg by mouth every 8 (eight) hours as needed for nausea or vomiting.    . sodium bicarbonate 650 MG tablet Take 650 mg by mouth 3 (three) times daily.      Objective: BP 154/72 mmHg  Pulse  78  Temp(Src) 97.1 F (36.2 C) (Oral)  Resp 21  Wt 279 lb 12.2 oz (126.9 kg)  SpO2 97% Exam: General: elderly male, lying in bed, NAD Eyes: perrl, eomi ENTM: mmm, oropharynx clear Neck: JVD present Cardiovascular: RRR, II/VI SEM Respiratory: bibasilar crackles, otherwise CTAB, slightly increased work of breathing with talking Abdomen: soft, NTND MSK: unna boots in place bilaterally and pitting edema present bilaterally to the knees Skin: no rashes Neuro: alert and oriented, no focal deficits noted Psych: normal mood and behavior  Labs and Imaging: CBC BMET   Recent Labs Lab 06/23/15 1610  WBC 11.5*  HGB 11.0*  HCT 34.8*  PLT 139*    Recent Labs Lab 06/23/15 1840  NA 139  K 4.1  CL 103  CO2 25  BUN 68*  CREATININE 7.81*  GLUCOSE 114*  CALCIUM 8.2*    BNP 361.7 iTrop 0.00  Dg Chest Port 1 View  06/23/2015   CLINICAL DATA:  64 year old diabetic hypertensive male with acute shortness of breath that started during dialysis. Subsequent encounter.  EXAM: PORTABLE CHEST - 1 VIEW  COMPARISON:  04/04/2015.  FINDINGS: Cardiomegaly with pulmonary edema. Small pleural effusion suspected.  No gross pneumothorax.  Calcified slightly  tortuous aorta.  IMPRESSION: Cardiomegaly with pulmonary edema. Small pleural effusion suspected.  Calcified slightly tortuous aorta.   Electronically Signed   By: Genia Del M.D.   On: 06/23/2015 16:13    Frazier Richards, MD MPH 06/23/2015, 9:54 PM PGY-3, [facility] pager: [phone], text pages welcome

## 2015-06-23 NOTE — ED Provider Notes (Signed)
CSN: [id number]     Arrival date & time 06/23/15  1532 History   First MD Initiated Contact with Patient 06/23/15 1532     Chief Complaint  Patient presents with  . Shortness of Breath   (Consider location/radiation/quality/duration/timing/severity/associated sxs/prior Treatment) Patient is a 64 y.o. male presenting with shortness of breath. The history is provided by the patient. No language interpreter was used.  Shortness of Breath Severity:  Moderate Onset quality:  Sudden Timing:  Constant Progression:  Worsening Chronicity:  New Relieved by:  Nothing Worsened by:  Nothing tried Ineffective treatments:  None tried Associated symptoms: no abdominal pain, no chest pain, no cough, no diaphoresis, no fever, no headaches, no sputum production, no syncope, no vomiting and no wheezing   Risk factors: obesity     Past Medical History  Diagnosis Date  . Diabetes mellitus   . Hypertension   . Hyperlipidemia   . Chronic kidney disease   . PVD (peripheral vascular disease)   . Venous insufficiency   . Morbid obesity   . Chronic cystitis   . Arthritis   . UMBILICAL HERNIA [id number]    Qualifier: History of  By: [name] MD, Audelia Acton    . NEPHROLITHIASIS, HX OF 12/02/2009    Qualifier: Diagnosis of  By: [name], Cat    . Lymphedema   . CHF (congestive heart failure)   . Asthma     as a child  . H/O hiatal hernia     states it's been fixed  . Dry skin    Past Surgical History  Procedure Laterality Date  . R knee arthoscopic repair of meniscus    . Umbilical hernia repair    . Amputation      Right and left fifth toes.   . Popliteal to posterior tibial bypass      2006  . Eye surgery Bilateral     cataract and lens implant  . Colonoscopy    . Hernia repair    . Av fistula placement Left 03/21/2014    Procedure: ARTERIOVENOUS (AV) FISTULA CREATION with ultrasound;  Surgeon: [name], MD;  Location: [facility] OR;  Service: Vascular;  Laterality: Left;   Family History  Problem  Relation Age of Onset  . Diabetes Mother   . Heart disease Mother   . Diabetes Brother    History  Substance Use Topics  . Smoking status: Former Smoker    Quit date: 06/06/1979  . Smokeless tobacco: Former Systems developer  . Alcohol Use: No     Comment:   "years ago" , none now    Review of Systems  Constitutional: Negative for fever, diaphoresis and fatigue.  Respiratory: Positive for shortness of breath. Negative for cough, sputum production, chest tightness and wheezing.   Cardiovascular: Negative for chest pain and syncope.  Gastrointestinal: Negative for vomiting and abdominal pain.  Musculoskeletal: Negative for back pain.  Neurological: Negative for weakness, light-headedness and headaches.  Psychiatric/Behavioral: Negative for confusion.  All other systems reviewed and are negative.    Allergies  Shrimp and Iodine  Home Medications   Prior to Admission medications   Medication Sig Start Date End Date Taking? Authorizing Provider  amLODipine (NORVASC) 10 MG tablet Take 10 mg by mouth every morning.    Historical Provider, MD  aspirin EC 81 MG tablet Take 81 mg by mouth every morning.    Historical Provider, MD  atorvastatin (LIPITOR) 40 MG tablet Take 1 tablet (40 mg total) by mouth daily at 6  PM. 03/11/14   Frazier Richards, MD  calcitRIOL (ROCALTROL) 0.25 MCG capsule Take 0.25 mcg by mouth daily.  04/24/14   Historical Provider, MD  carvedilol (COREG) 25 MG tablet Take 1 tablet (25 mg total) by mouth 2 (two) times daily with a meal. 03/11/14   Frazier Richards, MD  colchicine 0.6 MG tablet Take 0.5 tablets (0.3 mg total) by mouth daily. 04/06/15   Barton Dubois, MD  darbepoetin (ARANESP) 100 MCG/0.5ML SOLN injection Inject 100 mcg into the skin every 14 (fourteen) days. [facility].    Historical Provider, MD  furosemide (LASIX) 80 MG tablet Take 80 mg by mouth 2 (two) times daily.    Historical Provider, MD  glipiZIDE (GLUCOTROL) 10 MG tablet Take 5-10 mg by mouth daily before  breakfast. Takes 1/2-1 tablet depending on sugar. Hold if sugar is less than 98    Historical Provider, MD  hydrALAZINE (APRESOLINE) 100 MG tablet Take 1 tablet (100 mg total) by mouth 2 (two) times daily. take 1 tablet by mouth twice a day 01/20/14   Waldemar Dickens, MD  ondansetron (ZOFRAN-ODT) 4 MG disintegrating tablet Take 4 mg by mouth every 8 (eight) hours as needed for nausea or vomiting.    Historical Provider, MD  sodium bicarbonate 650 MG tablet Take 650 mg by mouth 3 (three) times daily.    Historical Provider, MD   BP 180/114 mmHg  Pulse 76  Temp(Src) 97.8 F (36.6 C) (Oral)  Resp 24  SpO2 95% Physical Exam  Constitutional: He is oriented to person, place, and time. He appears well-developed and well-nourished.  Non-toxic appearance. No distress. Nasal cannula in place.  HENT:  Head: Normocephalic and atraumatic.  Nose: Nose normal.  Mouth/Throat: Oropharynx is clear and moist. No oropharyngeal exudate.  Eyes: EOM are normal. Pupils are equal, round, and reactive to light.  Neck: Normal range of motion. Neck supple.  Cardiovascular: Normal rate, regular rhythm, normal heart sounds and intact distal pulses.   No murmur heard. LUE with AVF, + thrill.   Pulmonary/Chest: Accessory muscle usage present. Tachypnea noted. No respiratory distress. He has decreased breath sounds. He has no wheezes. He exhibits no tenderness.  Increased WOB with abdominal accessory use, diffuse rhonchi, decreased aeration at bilateral bases  Abdominal: Soft. He exhibits no distension. There is no tenderness. There is no guarding.  Musculoskeletal: Normal range of motion. He exhibits edema (2+ bilateral pitting edema in distal lower extremities). He exhibits no tenderness.  Neurological: He is alert and oriented to person, place, and time. No cranial nerve deficit. Coordination normal.  Skin: Skin is warm and dry. He is not diaphoretic. No pallor.  Psychiatric: He has a normal mood and affect. His  behavior is normal. Judgment and thought content normal.  Nursing note and vitals reviewed.   ED Course  Procedures (including critical care time) Labs Review Labs Reviewed  CBC - Abnormal; Notable for the following:    WBC 11.5 (*)    RBC 3.82 (*)    Hemoglobin 11.0 (*)    HCT 34.8 (*)    RDW 16.9 (*)    Platelets 139 (*)    All other components within normal limits  BASIC METABOLIC PANEL  BRAIN NATRIURETIC PEPTIDE  DIFFERENTIAL  Randolm Idol, ED    Imaging Review Dg Chest Port 1 View  06/23/2015   CLINICAL DATA:  64 year old diabetic hypertensive male with acute shortness of breath that started during dialysis. Subsequent encounter.  EXAM: PORTABLE CHEST - 1  VIEW  COMPARISON:  04/04/2015.  FINDINGS: Cardiomegaly with pulmonary edema. Small pleural effusion suspected.  No gross pneumothorax.  Calcified slightly tortuous aorta.  IMPRESSION: Cardiomegaly with pulmonary edema. Small pleural effusion suspected.  Calcified slightly tortuous aorta.   Electronically Signed   By: Genia Del M.D.   On: 06/23/2015 16:13     EKG Interpretation   Date/Time:  Tuesday June 23 2015 15:43:29 EDT Ventricular Rate:  81 PR Interval:  179 QRS Duration: 91 QT Interval:  437 QTC Calculation: 507 R Axis:   45 Text Interpretation:  Sinus rhythm Borderline T abnormalities, lateral  leads Prolonged QT interval When compared with ECG of 03/07/2014, QT has  lengthened Confirmed by [facility]  MD, DAVID ([id number]) on 06/23/2015 4:05:55 PM      MDM   Final diagnoses:  SOB (shortness of breath)  CHF exacerbation   Pt is a 64 yo M with hx of DM, HTN, PVD, ESRD, dCHF, MGUS, and chronic lower extremity edema, who presents with SOB.  Complains of several days of progressive SOB.  Has been diagnosed with ESRD and today was his first HD treatment via LUE AVF.  Reports that they did an entire treatment and notes show they took off 1.5 L of fluid.  After HD, he became increasingly SOB and was subsequently  sent to the ED.  He was maintaining his O2 sats in upper 90s but with increased work of breathing, abdominal accessory muscle use, diffuse rhonchi, and decreased sounds in bilateral bases.   2+ bilateral pitting edema.    CXR shows pulmonary edema.  BNP 361.    Exam and results consistent with CHF exacerbation.  Pt took lasix until yesterday, then stopped 2/2 starting HD.  Still makes urine.  Took 80 mg po lasix at home daily.  Will give 60 mg IV lasix for now.    Spoke to Dr. Melvia Heaps with nephro.  To take patient to emergent dialysis then he will need admission. Pt has seen Family Medicine Residency team in the past for PCP, so they were paged for admission.   Family medicine team re-paged after patient was in dialysis.    2000: Admitted to family medicine resident team, Dr. Ree Kida, tele appropriate.   If performed, labs, EKGs, and imaging were reviewed and interpreted by myself and my attending, and incorporated in the medical decision making.  Patient was seen with ED Attending, Dr. Maryjean Ka, MD   Tori Milks, MD [phone]  Delora Fuel, MD [id number]

## 2015-06-23 NOTE — Progress Notes (Signed)
Pt completed HD with no complications. 4 L fluid removed. Alert, vss. Report given to primary RN and 3E transported patient to room.

## 2015-06-23 NOTE — Procedures (Signed)
I was present at this session.  I have reviewed the session itself and made appropriate changes.  HD lowering vol and bp. tol well, more comfortable.  Kielee Care L 7/12/20167:57 PM

## 2015-06-23 NOTE — ED Notes (Addendum)
Per EMS: Sob x week or 2, went to dialysis today (first treatment ever), after treatment he felt worse.  Only took 1.3 liters, staff thinks he needs more but they cant do it there.   HR 75 NSR with runs of PACs. 98% on room air.  Hypertensive at 189/89. Fistula in left forearm, it infiltrated during dialysis and ice was placed on it.

## 2015-06-23 NOTE — Consult Note (Addendum)
Renal Service Consult Note [facility]  [name] 06/23/2015 [name] D Requesting Physician:  Dr Roxanne Mins  Reason for Consult:  ESRD patient just started HD today here post HD with SOB HPI: The patient is a 64 y.o. year-old AAM with hx of DM2, HTN and CKD . He just started dialysis today with 2h 48min session , 2-3 kg removed per pt.  Post HD had SOB worse than pre HD and came to ED. Has had DOE x 24-48 hours.  CXR shows pulm edema. Asked to see for HD.    Denies any CP, fevers, chills or prod cough.  No abd pain , n/v/d.  No HA or confusion. Has L arm AVF used today for HD.   Past Medical History  Past Medical History  Diagnosis Date  . Diabetes mellitus   . Hypertension   . Hyperlipidemia   . Chronic kidney disease   . PVD (peripheral vascular disease)   . Venous insufficiency   . Morbid obesity   . Chronic cystitis   . Arthritis   . UMBILICAL HERNIA [id number]    Qualifier: History of  By: [name] MD, Audelia Acton    . NEPHROLITHIASIS, HX OF 12/02/2009    Qualifier: Diagnosis of  By: [name], Cat    . Lymphedema   . CHF (congestive heart failure)   . Asthma     as a child  . H/O hiatal hernia     states it's been fixed  . Dry skin    Past Surgical History  Past Surgical History  Procedure Laterality Date  . R knee arthoscopic repair of meniscus    . Umbilical hernia repair    . Amputation      Right and left fifth toes.   . Popliteal to posterior tibial bypass      2006  . Eye surgery Bilateral     cataract and lens implant  . Colonoscopy    . Hernia repair    . Av fistula placement Left 03/21/2014    Procedure: ARTERIOVENOUS (AV) FISTULA CREATION with ultrasound;  Surgeon: [name], MD;  Location: [facility] OR;  Service: Vascular;  Laterality: Left;   Family History  Family History  Problem Relation Age of Onset  . Diabetes Mother   . Heart disease Mother   . Diabetes Brother    Social History  reports that he quit smoking about 36 years ago.  He has quit using smokeless tobacco. He reports that he does not drink alcohol or use illicit drugs. Allergies  Allergies  Allergen Reactions  . Shrimp [Shellfish Allergy] Swelling  . Iodine Swelling   Home medications Prior to Admission medications   Medication Sig Start Date End Date Taking? Authorizing Provider  amLODipine (NORVASC) 10 MG tablet Take 10 mg by mouth every morning.    Historical Provider, MD  aspirin EC 81 MG tablet Take 81 mg by mouth every morning.    Historical Provider, MD  atorvastatin (LIPITOR) 40 MG tablet Take 1 tablet (40 mg total) by mouth daily at 6 PM. 03/11/14   Frazier Richards, MD  calcitRIOL (ROCALTROL) 0.25 MCG capsule Take 0.25 mcg by mouth daily.  04/24/14   Historical Provider, MD  carvedilol (COREG) 25 MG tablet Take 1 tablet (25 mg total) by mouth 2 (two) times daily with a meal. 03/11/14   Frazier Richards, MD  colchicine 0.6 MG tablet Take 0.5 tablets (0.3 mg total) by mouth daily. 04/06/15   Barton Dubois, MD  darbepoetin (ARANESP) 100 MCG/0.5ML SOLN injection Inject 100 mcg into the skin every 14 (fourteen) days. [facility].    Historical Provider, MD  furosemide (LASIX) 80 MG tablet Take 80 mg by mouth 2 (two) times daily.    Historical Provider, MD  glipiZIDE (GLUCOTROL) 10 MG tablet Take 5-10 mg by mouth daily before breakfast. Takes 1/2-1 tablet depending on sugar. Hold if sugar is less than 98    Historical Provider, MD  hydrALAZINE (APRESOLINE) 100 MG tablet Take 1 tablet (100 mg total) by mouth 2 (two) times daily. take 1 tablet by mouth twice a day 01/20/14   Waldemar Dickens, MD  ondansetron (ZOFRAN-ODT) 4 MG disintegrating tablet Take 4 mg by mouth every 8 (eight) hours as needed for nausea or vomiting.    Historical Provider, MD  sodium bicarbonate 650 MG tablet Take 650 mg by mouth 3 (three) times daily.    Historical Provider, MD   Liver Function Tests No results for input(s): AST, ALT, ALKPHOS, BILITOT, PROT, ALBUMIN in the last 168 hours. No  results for input(s): LIPASE, AMYLASE in the last 168 hours. CBC  Recent Labs Lab 06/23/15 1610  WBC 11.5*  HGB 11.0*  HCT 34.8*  MCV 91.1  PLT [id number]*   Basic Metabolic Panel  Recent Labs Lab 06/23/15 1610  NA 141  K 4.1  CL 103  CO2 25  GLUCOSE 116*  BUN 67*  CREATININE 7.69*  CALCIUM 8.3*    Filed Vitals:   06/23/15 1546 06/23/15 1600 06/23/15 1630 06/23/15 1645  BP: 180/114 174/71 194/54 172/63  Pulse:  78 77 74  Temp:      TempSrc:      Resp:  24 23 18   SpO2:  96% 94% 100%   Exam On FM O2, tachypneic, not in severe distress No rash, cyanosis or gangrene Sclera anicteric, throat clear ++JVD Chest faint basilar rales bilat RRR 2/6 sem no RG Abd obese, soft ntnd no ascites GU normal male Ext bilat LE's are wrapped in Unna boots, bilat pitting edema below the knees 1-2+ Left forearm AVF +bruit Neuro is Ox 3, groggy  New HD start today, ran 2.45 hours, 2.5 kg approx removed per pt  CXR mild mod CHF EKG NSR no acute changes, ?old anteroseptal infarct  Assessment: 1. Dyspnea/ pulm edema - due to vol overload in ESRD pt 2. ESRD had first HD today 3. DM2 4. HTN on 3 bp meds 5. PVD 6. Anemia Hb 11.5   Plan- HD tonight, max UF, repeat HD tomorrow  [name] MD (pgr) [phone]    [phone] 06/23/2015, 5:03 PM

## 2015-06-24 DIAGNOSIS — I509 Heart failure, unspecified: Secondary | ICD-10-CM

## 2015-06-24 DIAGNOSIS — E118 Type 2 diabetes mellitus with unspecified complications: Secondary | ICD-10-CM

## 2015-06-24 DIAGNOSIS — N186 End stage renal disease: Secondary | ICD-10-CM

## 2015-06-24 DIAGNOSIS — E8770 Fluid overload, unspecified: Principal | ICD-10-CM

## 2015-06-24 DIAGNOSIS — Z992 Dependence on renal dialysis: Secondary | ICD-10-CM

## 2015-06-24 LAB — RENAL FUNCTION PANEL
ALBUMIN: 2.9 g/dL — AB (ref 3.5–5.0)
Anion gap: 8 (ref 5–15)
BUN: 48 mg/dL — ABNORMAL HIGH (ref 6–20)
CO2: 29 mmol/L (ref 22–32)
Calcium: 8.1 mg/dL — ABNORMAL LOW (ref 8.9–10.3)
Chloride: 103 mmol/L (ref 101–111)
Creatinine, Ser: 6.73 mg/dL — ABNORMAL HIGH (ref 0.61–1.24)
GFR calc non Af Amer: 8 mL/min — ABNORMAL LOW (ref >60)
GFR, EST AFRICAN AMERICAN: 9 mL/min — AB (ref >60)
Glucose, Bld: 127 mg/dL — ABNORMAL HIGH (ref 65–99)
PHOSPHORUS: 4.7 mg/dL — AB (ref 2.5–4.6)
Potassium: 3.8 mmol/L (ref 3.5–5.1)
SODIUM: 140 mmol/L (ref 135–145)

## 2015-06-24 LAB — CBC
HEMATOCRIT: 29.6 % — AB (ref 39.0–52.0)
Hemoglobin: 9.4 g/dL — ABNORMAL LOW (ref 13.0–17.0)
MCH: 28.8 pg (ref 26.0–34.0)
MCHC: 31.8 g/dL (ref 30.0–36.0)
MCV: 90.8 fL (ref 78.0–100.0)
Platelets: 131 10*3/uL — ABNORMAL LOW (ref 150–400)
RBC: 3.26 MIL/uL — ABNORMAL LOW (ref 4.22–5.81)
RDW: 16.7 % — AB (ref 11.5–15.5)
WBC: 9.8 10*3/uL (ref 4.0–10.5)

## 2015-06-24 LAB — GLUCOSE, CAPILLARY
GLUCOSE-CAPILLARY: 115 mg/dL — AB (ref 65–99)
Glucose-Capillary: 93 mg/dL (ref 65–99)
Glucose-Capillary: 124 mg/dL — ABNORMAL HIGH (ref 65–99)
Glucose-Capillary: 141 mg/dL — ABNORMAL HIGH (ref 65–99)

## 2015-06-24 MED ORDER — HYDRALAZINE HCL 25 MG PO TABS
25.0000 mg | ORAL_TABLET | Freq: Two times a day (BID) | ORAL | Status: DC
  Administered 2015-06-25 – 2015-06-30 (×11): 25 mg via ORAL
  Filled 2015-06-24 (×13): qty 1

## 2015-06-24 NOTE — Progress Notes (Signed)
Family Medicine Teaching Service Daily Progress Note Intern Pager: [phone]  Patient name: [name] Medical record number: [id number] Date of birth: 1951-01-16 Age: 64 y.o. Gender: male  Primary Care Provider: Lucrezia Starch, MD Consultants: nephrology Code Status: full  Pt Overview and Major Events to Date:  7/12: Admitted to floor 7/13: HD   Assessment and Plan:  [name] is a 64 y.o. male presenting with SOB following first dialysis session earlier today. PMH is significant for HTN, CHF, DM2, ESRD and MGUS  Dyspnea/volume overload/CHF/ESRD: SOB following first dialysis session yesterday, hypervolemic on exam, orthopnea and PND stable, CXR with pulm edema. No chest pain. Acute onset likely related to anxiety and discomfort following first dialysis session - admit to inpatient, Dr. Ree Kida attending - HD on 7/13; 4L removed - F/u with nephrology consult - update echo during hospitalization - continue home CHF/HTN meds: coreg and amlodipine. Per renal: hold lasix, begin sodium bicarb, decrease hydralazine from 100 to 25mg  - ED EKG w/o acute changes but poor quality, trop neg, repeat EKG -Monitor O2. Pt currently on RA at 100% O2 sat  DM: Last A1c 6.6 last month, on glipizide only at home - hold home glipizide - sensitive SSI  Chronic venous insufficiency: gets weekly unna boot changes at wound care - continue Unna boots - f/u with wound care  FEN/GI: renal/carb mod diet, SLIV Prophylaxis: SQ heparin Disposition: stable  Subjective:  Patient was seen and is in no acute distress. Shortness of breath has resolved. He is now breathing RA and has 100% O2 saturation. His Unna boots were changed today and he received dialysis removing 4 L.    Objective: Temp:  [97.1 F (36.2 C)-98.6 F (37 C)] 98.6 F (37 C) (07/13 1333) Pulse Rate:  [63-100] 99 (07/13 1456) Resp:  [16-24] 16 (07/13 1456) BP: (119-204)/(53-89) 177/60 mmHg (07/13 1456) SpO2:  [90 %-100 %] 97 %  (07/13 1456) Weight:  [265 lb 10.5 oz (120.5 kg)-279 lb 12.2 oz (126.9 kg)] 265 lb 10.5 oz (120.5 kg) (07/13 1456) Physical Exam: Gen.: In NAD, sitting up in bed  HEENT: Normocephalic, pupils equal bilaterally, extraocular motor intact, moist mucous membranes Cardiac: Regular rate and rhythm, S1 and S2 present, no murmurs, no heaves or thrills Respiratory: Normal work of breathing, clear to auscultation bilaterally Extremities: Unna boots present bilaterally, Trace edema, 2+ radial pulses bilaterally  Laboratory:  Recent Labs Lab 06/23/15 1610 06/24/15 0320  WBC 11.5* 9.8  HGB 11.0* 9.4*  HCT 34.8* 29.6*  PLT 139* 131*    Recent Labs Lab 06/23/15 1610 06/23/15 1840 06/24/15 0320  NA 141 139 140  K 4.1 4.1 3.8  CL 103 103 103  CO2 25 25 29   BUN 67* 68* 48*  CREATININE 7.69* 7.81* 6.73*  CALCIUM 8.3* 8.2* 8.1*  GLUCOSE 116* 114* 127*    Imaging/Diagnostic Tests: Dg Chest Port 1 View  06/23/2015   CLINICAL DATA:  64 year old diabetic hypertensive male with acute shortness of breath that started during dialysis. Subsequent encounter.  EXAM: PORTABLE CHEST - 1 VIEW  COMPARISON:  04/04/2015.  FINDINGS: Cardiomegaly with pulmonary edema. Small pleural effusion suspected.  No gross pneumothorax.  Calcified slightly tortuous aorta.  IMPRESSION: Cardiomegaly with pulmonary edema. Small pleural effusion suspected.  Calcified slightly tortuous aorta.   Electronically Signed   By: Genia Del M.D.   On: 06/23/2015 16:13     [name], MD 06/24/2015, 3:56 PM PGY-1, [place] Intern pager: [phone], text pages  welcome

## 2015-06-24 NOTE — Progress Notes (Signed)
Telemetry called RN to inform her pt had 15 beats of SVT; pt asymptomatic; MD notified not new orders received; will continue to closely monitor pt. Francis Gaines Stephane Niemann RN.

## 2015-06-24 NOTE — Progress Notes (Signed)
Orthopedic Tech Progress Note Patient Details:  [name] Nov 10, 1951 [id number]  Ortho Devices Type of Ortho Device: Louretta Parma boot Ortho Device/Splint Location: bilateral Ortho Device/Splint Interventions: Application   Hildred Priest 06/24/2015, 9:40 AM

## 2015-06-24 NOTE — Progress Notes (Signed)
Pt back from dialysis to room; pt alert and verbally responsive. Francis Gaines Venus Gilles RN.

## 2015-06-24 NOTE — Consult Note (Signed)
WOC wound consult note Reason for Consult: Consult requested for bilat Una boots.  Pt states he is followed by the outpatient wound care center and has chronic venous stasis ulcers to BLE.  He wears bilat Una boots which are changed weekly and he was due for an appointment today and will miss it since he is in the hospital. Wound type: Removed bilat Una boots to assess legs. There are only 2 small remaining areas of healing full thickness wounds; Left outer calf .2X.2X.2cm and right outer calf .2X.2X.1cm.  Both are pink and dry, no odor or drainage.   Periwound: Generalized edema to bilat legs with dry cracked peeling skin, appearance consistent with venous stasis changes. Dressing procedure/placement/frequency: Washed legs and paged ortho tech to re-apply bilat Una boots and coban.  Continue present plan of care with changing Q Wed and patient can resume follow-up with the outpatient wound care center after discharge.  Discussed plan of care and he verbalizes understanding. Please re-consult if further assistance is needed.  Thank-you,  Julien Girt MSN, [employer], [place], [place], [place]

## 2015-06-24 NOTE — Progress Notes (Addendum)
Hemodialysis- Treatment discontinued after 1.5 hours d/t needle issues. Venous needle cannulated successfully however arterial needle had to be cannulated x3 (no infiltration, just could not achieve adequate flow). Access remains swollen from previous infiltration yesterday. Pressures variable throughout treatment at only 150 bfr. System clotted x1 and was in process of clotting again d/t frequent alarming when tx discontinued. Dr. Jonnie Finner notified of issue. Order to make patient NPO after midnight. Done. Net UF 1L. Continue to monitor. Ice applied to arm and patient sent back to room. Report given to RN at bedside

## 2015-06-24 NOTE — Progress Notes (Signed)
Pt transported off unit to dialysis. P. Amo Deshawn Witty RN 

## 2015-06-24 NOTE — Progress Notes (Signed)
Utilization Review Completed.[name] T7/13/2016

## 2015-06-24 NOTE — Progress Notes (Signed)
  [facility] Progress Note   Subjective: breathing better but not to baseline yet, +DOE. 3.9L off last night w HD  Filed Vitals:   06/23/15 2200 06/23/15 2312 06/24/15 0219 06/24/15 0508  BP: 119/53 163/77 122/64 124/59  Pulse: 79 75 72 74  Temp: 97.5 F (36.4 C) 98 F (36.7 C) 98.3 F (36.8 C) 98.2 F (36.8 C)  TempSrc: Oral Oral Oral Oral  Resp: 18 18 18 18   Height:  5\' 11"  (1.803 m)    Weight:  120.793 kg (266 lb 4.8 oz)  120.729 kg (266 lb 2.6 oz)  SpO2: 98% 100% 100% 100%   Exam: Alert, no distress, up with walked, nasal O2 Chest clear bilat RRR 2/6 sem no RG Abd obese, soft ntnd no ascites GU normal male Ext bilat LE's are wrapped in Unna boots, bilat pitting edema below the knees 1+ Left forearm AVF +bruit Neuro is Ox 3 alert  New HD start this week TTS at [facility]  CXR mild mod CHF EKG NSR no acute changes, ?old anteroseptal infarct  Assessment: 1. Acute resp failure/ pulm edema/ vol overload - in new ESRD pt, better but needs more vol off.  2. ESRD new start TTS 3. DM2 4. HTN on 3 bp meds 5. PVD 6. Anemia Hb 11.5  Plan - extra HD today and regular HD in am. Decreased BP meds. Should be ok for dc after HD tomorrow.     [name] MD  pager [phone]    cell [phone]  06/24/2015, 10:00 AM     Recent Labs Lab 06/23/15 1610 06/23/15 1840 06/24/15 0320  NA 141 139 140  K 4.1 4.1 3.8  CL 103 103 103  CO2 25 25 29   GLUCOSE 116* 114* 127*  BUN 67* 68* 48*  CREATININE 7.69* 7.81* 6.73*  CALCIUM 8.3* 8.2* 8.1*  PHOS  --  5.1* 4.7*    Recent Labs Lab 06/23/15 1840 06/24/15 0320  ALBUMIN 3.7 2.9*    Recent Labs Lab 06/23/15 1610 06/24/15 0320  WBC 11.5* 9.8  HGB 11.0* 9.4*  HCT 34.8* 29.6*  MCV 91.1 90.8  PLT 139* 131*   . amLODipine  10 mg Oral q morning - 10a  . aspirin EC  81 mg Oral q morning - 10a  . atorvastatin  40 mg Oral q1800  . calcitRIOL  0.25 mcg Oral Daily  . carvedilol  25 mg Oral BID WC  . colchicine   0.3 mg Oral Daily  . furosemide  80 mg Oral BID  . heparin  5,000 Units Subcutaneous 3 times per day  . hydrALAZINE  100 mg Oral BID  . insulin aspart  0-9 Units Subcutaneous TID WC  . sodium bicarbonate  650 mg Oral TID     sodium chloride, sodium chloride, acetaminophen **OR** acetaminophen, feeding supplement (NEPRO CARB STEADY), heparin, heparin, lidocaine (PF), lidocaine-prilocaine, ondansetron, pentafluoroprop-tetrafluoroeth, polyethylene glycol

## 2015-06-24 NOTE — Plan of Care (Signed)
Problem: Problem: Cardiovascular Progression Goal: NO VASCULAR COMPLICATION Outcome: Not Progressing Has chronic Venous insufficiency/PVD

## 2015-06-24 NOTE — Progress Notes (Signed)
New admission patient transferred from Hemodialysis to room 3e02. Oriented and educated patient to Heart Failure Floor and room equipment. VSS. Confirmed placement of telemetry leads with CMT technician. Family at the bedside. Will continue to monitor patient to end of shift.

## 2015-06-24 NOTE — Progress Notes (Signed)
Nutrition Brief Note  Patient identified on the Malnutrition Screening Tool (MST) Report  Wt Readings from Last 15 Encounters:  06/24/15 266 lb 2.6 oz (120.729 kg)  05/28/15 272 lb (123.378 kg)  05/07/15 271 lb (122.925 kg)  04/30/15 271 lb (122.925 kg)  04/06/15 271 lb 14.4 oz (123.333 kg)  02/27/15 275 lb (124.739 kg)  11/27/14 270 lb (122.471 kg)  09/11/14 274 lb (124.286 kg)  08/14/14 272 lb (123.378 kg)  06/12/14 276 lb 11.2 oz (125.51 kg)  05/29/14 285 lb 3.2 oz (129.366 kg)  04/29/14 286 lb (129.729 kg)  03/24/14 272 lb (123.378 kg)  03/21/14 278 lb (126.1 kg)  03/11/14 278 lb 3.5 oz (126.2 kg)    Body mass index is 37.14 kg/(m^2). Patient meets criteria for Obesity based on current BMI.   Current diet order is Renal/Carb Modified, patient is consuming approximately 100% of meals at this time. Labs and medications reviewed.   No nutrition interventions warranted at this time. If nutrition issues arise, please consult RD.   Pryor Ochoa RD, LDN Inpatient Clinical Dietitian Pager: [phone] After Hours Pager: [phone]

## 2015-06-24 NOTE — Progress Notes (Signed)
Pt weaned off oxygen; pt 100 on RA, denies any sob, distress or discomfort; will closely monitor and pt educated to notify RN if he begin to have any distress or sob. P. Amo Maycen Degregory RN.

## 2015-06-25 ENCOUNTER — Inpatient Hospital Stay (HOSPITAL_COMMUNITY): Payer: Medicare Other

## 2015-06-25 ENCOUNTER — Encounter (HOSPITAL_COMMUNITY): Payer: Medicare Other

## 2015-06-25 ENCOUNTER — Ambulatory Visit (HOSPITAL_COMMUNITY): Payer: Medicare Other

## 2015-06-25 ENCOUNTER — Encounter (HOSPITAL_COMMUNITY): Payer: Self-pay | Admitting: General Practice

## 2015-06-25 DIAGNOSIS — T82898A Other specified complication of vascular prosthetic devices, implants and grafts, initial encounter: Secondary | ICD-10-CM

## 2015-06-25 DIAGNOSIS — I5031 Acute diastolic (congestive) heart failure: Secondary | ICD-10-CM

## 2015-06-25 DIAGNOSIS — E8779 Other fluid overload: Secondary | ICD-10-CM

## 2015-06-25 DIAGNOSIS — I509 Heart failure, unspecified: Secondary | ICD-10-CM

## 2015-06-25 DIAGNOSIS — R0602 Shortness of breath: Secondary | ICD-10-CM

## 2015-06-25 DIAGNOSIS — T82590A Other mechanical complication of surgically created arteriovenous fistula, initial encounter: Secondary | ICD-10-CM | POA: Insufficient documentation

## 2015-06-25 LAB — BASIC METABOLIC PANEL
Anion gap: 10 (ref 5–15)
BUN: 53 mg/dL — AB (ref 6–20)
CALCIUM: 8.7 mg/dL — AB (ref 8.9–10.3)
CHLORIDE: 106 mmol/L (ref 101–111)
CO2: 26 mmol/L (ref 22–32)
Creatinine, Ser: 7.33 mg/dL — ABNORMAL HIGH (ref 0.61–1.24)
GFR calc Af Amer: 8 mL/min — ABNORMAL LOW (ref >60)
GFR, EST NON AFRICAN AMERICAN: 7 mL/min — AB (ref >60)
Glucose, Bld: 86 mg/dL (ref 65–99)
Potassium: 4.2 mmol/L (ref 3.5–5.1)
Sodium: 142 mmol/L (ref 135–145)

## 2015-06-25 LAB — APTT: aPTT: 35 seconds (ref 24–37)

## 2015-06-25 LAB — GLUCOSE, CAPILLARY
Glucose-Capillary: 96 mg/dL (ref 65–99)
Glucose-Capillary: 93 mg/dL (ref 65–99)
Glucose-Capillary: 95 mg/dL (ref 65–99)

## 2015-06-25 LAB — PROTIME-INR
INR: 1.18 (ref 0.00–1.49)
Prothrombin Time: 15.2 seconds (ref 11.6–15.2)

## 2015-06-25 MED ORDER — HEPARIN SODIUM (PORCINE) 1000 UNIT/ML DIALYSIS: 3000.0000 [IU] | INTRAMUSCULAR | Status: DC | PRN

## 2015-06-25 MED ORDER — LIDOCAINE-PRILOCAINE 2.5-2.5 % EX CREA
1.0000 "application " | TOPICAL_CREAM | CUTANEOUS | Status: DC | PRN
  Filled 2015-06-25: qty 5

## 2015-06-25 MED ORDER — SODIUM CHLORIDE 0.9 % IV SOLN: 100.0000 mL | INTRAVENOUS | Status: DC | PRN

## 2015-06-25 MED ORDER — LIDOCAINE HCL (PF) 1 % IJ SOLN: 5.0000 mL | INTRAMUSCULAR | Status: DC | PRN

## 2015-06-25 MED ORDER — NEPRO/CARBSTEADY PO LIQD
237.0000 mL | ORAL | Status: DC | PRN
  Filled 2015-06-25: qty 237

## 2015-06-25 MED ORDER — LIDOCAINE-EPINEPHRINE 1 %-1:100000 IJ SOLN
INTRAMUSCULAR | Status: AC
  Filled 2015-06-25: qty 1

## 2015-06-25 MED ORDER — ALTEPLASE 2 MG IJ SOLR
2.0000 mg | Freq: Once | INTRAMUSCULAR | Status: DC | PRN
Start: 2015-06-25 — End: 2015-06-25
  Filled 2015-06-25: qty 2

## 2015-06-25 MED ORDER — MIDAZOLAM HCL 2 MG/2ML IJ SOLN
INTRAMUSCULAR | Status: AC | PRN
  Administered 2015-06-25: 1 mg via INTRAVENOUS

## 2015-06-25 MED ORDER — IOHEXOL 300 MG/ML  SOLN
100.0000 mL | Freq: Once | INTRAMUSCULAR | Status: AC | PRN
  Administered 2015-06-25: 50 mL via INTRAVENOUS

## 2015-06-25 MED ORDER — FENTANYL CITRATE (PF) 100 MCG/2ML IJ SOLN
INTRAMUSCULAR | Status: AC | PRN
  Administered 2015-06-25: 50 ug via INTRAVENOUS

## 2015-06-25 MED ORDER — COLCHICINE 0.6 MG PO TABS
0.3000 mg | ORAL_TABLET | ORAL | Status: DC
  Filled 2015-06-25: qty 0.5

## 2015-06-25 MED ORDER — FENTANYL CITRATE (PF) 100 MCG/2ML IJ SOLN
INTRAMUSCULAR | Status: AC
Start: 2015-06-25 — End: 2015-06-26
  Filled 2015-06-25: qty 2

## 2015-06-25 MED ORDER — HEPARIN SODIUM (PORCINE) 1000 UNIT/ML IJ SOLN
INTRAMUSCULAR | Status: AC
  Filled 2015-06-25: qty 1

## 2015-06-25 MED ORDER — HEPARIN SODIUM (PORCINE) 1000 UNIT/ML DIALYSIS: 1000.0000 [IU] | INTRAMUSCULAR | Status: DC | PRN

## 2015-06-25 MED ORDER — PENTAFLUOROPROP-TETRAFLUOROETH EX AERO: 1.0000 "application " | INHALATION_SPRAY | CUTANEOUS | Status: DC | PRN

## 2015-06-25 MED ORDER — MIDAZOLAM HCL 2 MG/2ML IJ SOLN
INTRAMUSCULAR | Status: AC
  Filled 2015-06-25: qty 2

## 2015-06-25 NOTE — Sedation Documentation (Signed)
Patient is resting comfortably. 

## 2015-06-25 NOTE — Progress Notes (Signed)
When pt was put on the cardiac monitor for his HD Tx he was having frequent multifocal PVC's and short runs of A-fib. Dr. Jacolyn Reedy was called and notified. No new orders at this time. Dr. Joelyn Oms called and informed as well and pt was changed to a 4K bath.

## 2015-06-25 NOTE — Progress Notes (Signed)
Family Medicine Teaching Service Daily Progress Note Intern Pager: [phone]  Patient name: [name] Medical record number: [id number] Date of birth: [birth year] Age: 64 y.o. Gender: male  Primary Care Provider: Lucrezia Starch, MD Consultants: nephrology Code Status: full  Pt Overview and Major Events to Date:  7/12: Admitted to floor 7/13: HD   Assessment and Plan:  [name] is a 64 y.o. male presenting with SOB following first dialysis session earlier today. PMH is significant for HTN, CHF, DM2, ESRD and MGUS  Dyspnea/volume overload/CHF/ESRD: SOB following first dialysis session yesterday, hypervolemic on exam, orthopnea and PND stable, CXR with pulm edema. No chest pain. Acute onset likely related to anxiety and discomfort following first dialysis session - admit to inpatient, Dr. Ree Kida attending - HD on 7/13 AM; 4L removed, 7/13 PM; 1L removed, needle problems so it was stopped early - F/u with nephrology consult - update echo during hospitalization - continue home CHF/HTN meds: coreg and amlodipine. Per renal: hold lasix, begin sodium bicarb, decrease hydralazine from 100 to 25mg  - ED EKG w/o acute changes but poor quality, trop neg, repeat EKG -Monitor O2. Pt currently on RA at 100% O2 sat  DM: Last A1c 6.6 last month, on glipizide only at home - hold home glipizide - sensitive SSI  Chronic venous insufficiency: gets weekly unna boot changes at wound care - continue Unna boots - f/u with wound care  HTN; Patient has been hypertensive. Systolic [id number]. Diastolic WNL -Will leave medications as is and let nephrologist adjust    FEN/GI: renal/carb mod diet, SLIV Prophylaxis: SQ heparin Disposition: stable  Subjective:  Patient was seen and is in no acute distress. Shortness of breath has resolved. He is now breathing RA and has 100% O2 saturation. His Unna boots were changed today and he received dialysis removing 4 L.    Objective: Temp:  [98.1 F  (36.7 C)-99.2 F (37.3 C)] 99.2 F (37.3 C) (07/14 0603) Pulse Rate:  [55-99] 55 (07/14 0603) Resp:  [16-18] 18 (07/14 0603) BP: (150-179)/(60-83) 179/62 mmHg (07/14 0603) SpO2:  [94 %-100 %] 97 % (07/14 0603) Weight:  [261 lb 4.8 oz (118.525 kg)-265 lb 10.5 oz (120.5 kg)] 261 lb 4.8 oz (118.525 kg) (07/14 0603) Physical Exam: Gen.: In NAD, sitting up in bed  HEENT: Normocephalic, pupils equal bilaterally, extraocular motor intact, moist mucous membranes Cardiac: Regular rate and rhythm, S1 and S2 present, no murmurs, no heaves or thrills Respiratory: Normal work of breathing, clear to auscultation bilaterally Extremities: Unna boots present bilaterally, Trace edema, 2+ radial pulses bilaterally  Laboratory:  Recent Labs Lab 06/23/15 1610 06/24/15 0320  WBC 11.5* 9.8  HGB 11.0* 9.4*  HCT 34.8* 29.6*  PLT 139* 131*    Recent Labs Lab 06/23/15 1610 06/23/15 1840 06/24/15 0320  NA 141 139 140  K 4.1 4.1 3.8  CL 103 103 103  CO2 25 25 29   BUN 67* 68* 48*  CREATININE 7.69* 7.81* 6.73*  CALCIUM 8.3* 8.2* 8.1*  GLUCOSE 116* 114* 127*    Imaging/Diagnostic Tests: No results found.   [name], MD 06/25/2015, 7:20 AM PGY-1, [place] Intern pager: [phone], text pages welcome

## 2015-06-25 NOTE — Progress Notes (Addendum)
  [facility] Progress Note   Subjective: only could dialyze 1 hour yest due to poor arterial pressures and swelling of the fistula arm due to infiltrations. No complaints today.   Filed Vitals:   06/24/15 1748 06/24/15 2152 06/25/15 0125 06/25/15 0603  BP: 153/76 150/68 155/82 179/62  Pulse: 61 59 63 55  Temp:  98.6 F (37 C) 98.6 F (37 C) 99.2 F (37.3 C)  TempSrc:  Oral Oral Oral  Resp: 18 18 18 18   Height:      Weight:    118.525 kg (261 lb 4.8 oz)  SpO2: 94% 98% 97% 97%   Exam: Alert, no distress Chest clear bilat RRR 2/6 sem no RG Abd obese, soft ntnd no ascites GU normal male Ext bilat LE's are wrapped in Unna boots, bilat pitting edema below the knees 1+ Left forearm AVF +bruit, +moderate swelling around AVF d/ t infiltration Neuro is Ox 3 alert  New HD start this week TTS at [facility]  CXR mild mod CHF EKG NSR no acute changes, ?old anteroseptal infarct  Assessment: 1. Vol excess/ pulm edema - improved, still need more vol off though 2. AVF malfunction - infiltrating, difficult to stick and poor pressures, unable to reliably use. Plan fistulogram with IR and placement of TDC.  Will ask [facility] to see also.  3. HTN on coreg/ hydralazine, BP's up due to vol excess 4. ESRD new start TTS 5. DM2 6. Anemia Hb 11.5  Plan - fistulogram/ TDC per IR. HD later today after access established.   Addendum- IR did fistulogram, patent AVF , two sizable competing veins. They will place tunneled HD cath, rest AVF and have called to assess for possible revision or ligation of competing veins.    [name] MD  pager [phone]    cell [phone]  06/25/2015, 11:09 AM     Recent Labs Lab 06/23/15 1610 06/23/15 1840 06/24/15 0320  NA 141 139 140  K 4.1 4.1 3.8  CL 103 103 103  CO2 25 25 29   GLUCOSE 116* 114* 127*  BUN 67* 68* 48*  CREATININE 7.69* 7.81* 6.73*  CALCIUM 8.3* 8.2* 8.1*  PHOS  --  5.1* 4.7*    Recent Labs Lab 06/23/15 1840 06/24/15 0320   ALBUMIN 3.7 2.9*    Recent Labs Lab 06/23/15 1610 06/24/15 0320  WBC 11.5* 9.8  HGB 11.0* 9.4*  HCT 34.8* 29.6*  MCV 91.1 90.8  PLT 139* 131*   . aspirin EC  81 mg Oral q morning - 10a  . atorvastatin  40 mg Oral q1800  . calcitRIOL  0.25 mcg Oral Daily  . carvedilol  25 mg Oral BID WC  . colchicine  0.3 mg Oral Daily  . hydrALAZINE  25 mg Oral BID  . insulin aspart  0-9 Units Subcutaneous TID WC  . sodium bicarbonate  650 mg Oral TID     sodium chloride, sodium chloride, acetaminophen **OR** acetaminophen, feeding supplement (NEPRO CARB STEADY), ondansetron, polyethylene glycol

## 2015-06-25 NOTE — Discharge Summary (Signed)
[facility] Discharge Summary  Patient name: [name] Medical record number: [id number] Date of birth: 07-Jun-1951 Age: 64 y.o. Gender: male Date of Admission: 06/23/2015  Date of Discharge: 06/30/2015 Admitting Physician: Lupita Dawn, MD  Primary Care Provider: Lucrezia Starch, MD Consultants: nephrology, vascular surgery  Indication for Hospitalization: volume overload and shortness of breath   Discharge Diagnoses/Problem List:  Patient Active Problem List   Diagnosis Date Noted  . Acute gouty arthritis 06/28/2015  . Malfunction of arteriovenous dialysis fistula   . SOB (shortness of breath)   . CHF exacerbation   . CHF (congestive heart failure) 06/23/2015  . Volume overload 06/23/2015  . Lymphedema distichiasis syndrome with kidney disease and diabetes mellitus 04/06/2015  . Chronic kidney disease   . Acute on chronic renal failure 04/04/2015  . Lymphedema of lower extremity 04/04/2015  . DM (diabetes mellitus), type 2 with renal complications [id number]  . MGUS (monoclonal gammopathy of unknown significance) 06/12/2014  . End stage renal disease 04/29/2014  . Stasis dermatitis 02/26/2014  . Diastolic CHF [id number]  . Special screening for malignant neoplasms, colon 07/09/2013  . Knee pain 08/21/2012  . Mass of thigh 08/21/2012  . ORGANIC IMPOTENCE 12/02/2009  . ONYCHOMYCOSIS 07/17/2009  . ESRD (end stage renal disease) on dialysis 06/09/2008  . VENOUS INSUFFICIENCY, LEGS 04/13/2007  . Hyperlipidemia LDL goal < 100 03/19/2007  . Diabetes mellitus type 2 with complications [id number]  . OBESITY, MORBID 02/08/2007  . HYPERTENSION, BENIGN SYSTEMIC 02/08/2007  . Atherosclerosis of native arteries of extremity with intermittent claudication 02/08/2007    Disposition: home  Discharge Condition: stable  Discharge Exam:  Gen: Alert 64 y.o.male lying in bed watching TV HEENT: MMM, posterior oropharynx clear Pulm: Non-labored; scant  bibasilar crackles, no wheezes  CV: Regular rate, no murmur appreciated; L forearm AVF w/+thrill  GI: + BS; soft, non-tender, non-distended Skin: Bilateral unna boots. Ecchymosis on left forearm from AVF revision, incisions clean, dry and intact Neuro: A&Ox3, speech normal  Brief Hospital Course:   ESRD with volume overload:  Patient presented to ED with shortness of breath following his first HD session on 06/23/15. Pt noticed increased dyspnea and anxiety. O2 sats were in the upper 90's. Was given albuterol in the ED which did not help. CXR revealed pulmonary edema. Was given 60mg  of IV lasix. Patient's shortness of breath resolved. Dr. Melvia Heaps (Nephrology) was consulted and had patient taken to emergent dialysis. EKG revealed QTc lengthening. His AVF was not working properly during the second dialysis session, AVF revision on 7/18 per vascular surgery was performed. Lasix was held and Hydralazine was continued.   Issues for Follow Up:  1. ESRD  Significant Procedures: AVF revision  Significant Labs and Imaging:   Recent Labs Lab 06/23/15 1610 06/24/15 0320  WBC 11.5* 9.8  HGB 11.0* 9.4*  HCT 34.8* 29.6*  PLT 139* 131*    Recent Labs Lab 06/23/15 1610 06/23/15 1840 06/24/15 0320 06/25/15 1215  NA 141 139 140 142  K 4.1 4.1 3.8 4.2  CL 103 103 103 106  CO2 25 25 29 26   GLUCOSE 116* 114* 127* 86  BUN 67* 68* 48* 53*  CREATININE 7.69* 7.81* 6.73* 7.33*  CALCIUM 8.3* 8.2* 8.1* 8.7*  PHOS  --  5.1* 4.7*  --   ALBUMIN  --  3.7 2.9*  --     Results/Tests Pending at Time of Discharge: none  Discharge Medications:    Medication List    ASK your doctor  about these medications        amLODipine 10 MG tablet  Commonly known as:  NORVASC  Take 10 mg by mouth every morning.     aspirin EC 81 MG tablet  Take 81 mg by mouth every morning.     atorvastatin 40 MG tablet  Commonly known as:  LIPITOR  Take 1 tablet (40 mg total) by mouth daily at 6 PM.     calcitRIOL  0.25 MCG capsule  Commonly known as:  ROCALTROL  Take 0.25 mcg by mouth daily.     carvedilol 25 MG tablet  Commonly known as:  COREG  Take 1 tablet (25 mg total) by mouth 2 (two) times daily with a meal.     colchicine 0.6 MG tablet  Take 0.5 tablets (0.3 mg total) by mouth daily.     darbepoetin 100 MCG/0.5ML Soln injection  Commonly known as:  ARANESP  Inject 100 mcg into the skin every 14 (fourteen) days. [facility].     furosemide 80 MG tablet  Commonly known as:  LASIX  Take 80 mg by mouth 2 (two) times daily.     hydrALAZINE 100 MG tablet  Commonly known as:  APRESOLINE  Take 1 tablet (100 mg total) by mouth 2 (two) times daily. take 1 tablet by mouth twice a day     sodium bicarbonate 650 MG tablet  Take 650 mg by mouth 3 (three) times daily.        Discharge Instructions: Please refer to Patient Instructions section of EMR for full details.  Patient was counseled important signs and symptoms that should prompt return to medical care, changes in medications, dietary instructions, activity restrictions, and follow up appointments.   Follow-Up Appointments:   [name], MD 06/25/2015, 4:09 PM PGY-1, [place]

## 2015-06-25 NOTE — Progress Notes (Signed)
  Echocardiogram 2D Echocardiogram has been performed.  Jennette Dubin 06/25/2015, 9:59 AM

## 2015-06-25 NOTE — Care Management Important Message (Signed)
Important Message  Patient Details  Name: [name] MRN: [id number] Date of Birth: 1951/10/02   Medicare Important Message Given:  Yes-second notification given    [name] 06/25/2015, 2:20 PM

## 2015-06-25 NOTE — Procedures (Signed)
Normally functioning left forearm AVF without peripheral or central stenosis but with several hypertrophied venous collaterals about the left forearm.   No intervention performed.  Successful placement of tunneled HD catheter with tips terminating within the superior aspect of the right atrium.   The catheter is ready for immediate use.  The patient tolerated both of the above procedures well without immediate post procedural complication.

## 2015-06-25 NOTE — Consult Note (Signed)
Chief Complaint: Chief Complaint  Patient presents with  . Shortness of Breath  slow Left arm dialysis access  Referring Physician(s): [place]  History of Present Illness: [name] is a 64 y.o. male   Pt new to dialysis Left forearm dialysis graft placed approx 1 yr ago Never used til 3 days ago First use went well Attempt at use yesterday evening was slow flow and difficult cannulation Request has been made to evaluate graft with shuntogram and possibly place tunneled hemodialysis catheter if needed Dr Pascal Lux has reviewed imaging Approves procedure  Known shrimp intolerance--swelling No iodine documented allergy 2012 arteriogram was performed using contrast---pt does not recall premedication or reaction  Past Medical History  Diagnosis Date  . Diabetes mellitus   . Hypertension   . Hyperlipidemia   . Chronic kidney disease   . PVD (peripheral vascular disease)   . Venous insufficiency   . Morbid obesity   . Chronic cystitis   . Arthritis   . UMBILICAL HERNIA [id number]    Qualifier: History of  By: [name] MD, Audelia Acton    . NEPHROLITHIASIS, HX OF 12/02/2009    Qualifier: Diagnosis of  By: [name], Cat    . Lymphedema   . CHF (congestive heart failure)   . Asthma     as a child  . H/O hiatal hernia     states it's been fixed  . Dry skin     Past Surgical History  Procedure Laterality Date  . R knee arthoscopic repair of meniscus    . Umbilical hernia repair    . Amputation      Right and left fifth toes.   . Popliteal to posterior tibial bypass      2006  . Eye surgery Bilateral     cataract and lens implant  . Colonoscopy    . Hernia repair    . Av fistula placement Left 03/21/2014    Procedure: ARTERIOVENOUS (AV) FISTULA CREATION with ultrasound;  Surgeon: [name], MD;  Location: [place];  Service: Vascular;  Laterality: Left;    Allergies: Shrimp and Iodine  Medications: Prior to Admission medications   Medication Sig Start Date End  Date Taking? Authorizing Provider  amLODipine (NORVASC) 10 MG tablet Take 10 mg by mouth every morning.   Yes Historical Provider, MD  aspirin EC 81 MG tablet Take 81 mg by mouth every morning.   Yes Historical Provider, MD  atorvastatin (LIPITOR) 40 MG tablet Take 1 tablet (40 mg total) by mouth daily at 6 PM. 03/11/14  Yes Frazier Richards, MD  carvedilol (COREG) 25 MG tablet Take 1 tablet (25 mg total) by mouth 2 (two) times daily with a meal. 03/11/14  Yes Frazier Richards, MD  colchicine 0.6 MG tablet Take 0.5 tablets (0.3 mg total) by mouth daily. Patient taking differently: Take 0.6 mg by mouth daily as needed (gout flare ups).  04/06/15  Yes Barton Dubois, MD  darbepoetin (ARANESP) 100 MCG/0.5ML SOLN injection Inject 100 mcg into the skin every 14 (fourteen) days. [facility].   Yes Historical Provider, MD  hydrALAZINE (APRESOLINE) 100 MG tablet Take 1 tablet (100 mg total) by mouth 2 (two) times daily. take 1 tablet by mouth twice a day 01/20/14  Yes Waldemar Dickens, MD  calcitRIOL (ROCALTROL) 0.25 MCG capsule Take 0.25 mcg by mouth daily.  04/24/14   Historical Provider, MD  furosemide (LASIX) 80 MG tablet Take 80 mg by mouth 2 (two) times  daily.    Historical Provider, MD  sodium bicarbonate 650 MG tablet Take 650 mg by mouth 3 (three) times daily.    Historical Provider, MD     Family History  Problem Relation Age of Onset  . Diabetes Mother   . Heart disease Mother   . Diabetes Brother     History   Social History  . Marital Status: Legally Separated    Spouse Name: N/A  . Number of Children: 1  . Years of Education: N/A   Occupational History  .     Social History Main Topics  . Smoking status: Former Smoker    Quit date: 06/06/1979  . Smokeless tobacco: Former Systems developer  . Alcohol Use: No     Comment:   "years ago" , none now  . Drug Use: No  . Sexual Activity: No   Other Topics Concern  . None   Social History Narrative   Lives alone.  Only son is deceased.       Review of Systems: A 12 point ROS discussed and pertinent positives are indicated in the HPI above.  All other systems are negative.  Review of Systems  Constitutional: Positive for fatigue. Negative for fever.  Respiratory: Negative for shortness of breath.   Neurological: Positive for weakness.  Psychiatric/Behavioral: Negative for behavioral problems and confusion.    Vital Signs: BP 179/62 mmHg  Pulse 55  Temp(Src) 99.2 F (37.3 C) (Oral)  Resp 18  Ht 5\' 11"  (1.803 m)  Wt 261 lb 4.8 oz (118.525 kg)  BMI 36.46 kg/m2  SpO2 97%  Physical Exam  Constitutional: He is oriented to person, place, and time.  Cardiovascular: Normal rate and regular rhythm.   Pulmonary/Chest: Effort normal and breath sounds normal.  Abdominal: Soft. Bowel sounds are normal.  Musculoskeletal: Normal range of motion.  L dialysis graft +pulse  Neurological: He is alert and oriented to person, place, and time.  Skin: Skin is warm and dry.  Psychiatric: He has a normal mood and affect. His behavior is normal. Thought content normal.  Nursing note and vitals reviewed.   Mallampati Score:  MD Evaluation Airway: WNL Heart: WNL Abdomen: WNL Chest/ Lungs: WNL ASA  Classification: 3 Mallampati/Airway Score: Two  Imaging: Dg Chest Port 1 View  06/23/2015   CLINICAL DATA:  64 year old diabetic hypertensive male with acute shortness of breath that started during dialysis. Subsequent encounter.  EXAM: PORTABLE CHEST - 1 VIEW  COMPARISON:  04/04/2015.  FINDINGS: Cardiomegaly with pulmonary edema. Small pleural effusion suspected.  No gross pneumothorax.  Calcified slightly tortuous aorta.  IMPRESSION: Cardiomegaly with pulmonary edema. Small pleural effusion suspected.  Calcified slightly tortuous aorta.   Electronically Signed   By: Genia Del M.D.   On: 06/23/2015 16:13    Labs:  CBC:  Recent Labs  04/04/15 1216 04/05/15 0533  05/28/15 0949 06/11/15 0932 06/23/15 1610 06/24/15 0320   WBC 11.1* 11.7*  --   --   --  11.5* 9.8  HGB 10.0* 9.4*  < > 8.8* 10.0* 11.0* 9.4*  HCT 30.8* 29.5*  --   --   --  34.8* 29.6*  PLT 149* 172  --   --   --  139* 131*  < > = values in this interval not displayed.  COAGS:  Recent Labs  06/25/15 1104  INR 1.18  APTT 35    BMP:  Recent Labs  04/05/15 0533 06/23/15 1610 06/23/15 1840 06/24/15 0320  NA 139 141 139 140  K 4.5 4.1 4.1 3.8  CL 106 103 103 103  CO2 19 25 25 29   GLUCOSE 108* 116* 114* 127*  BUN 100* 67* 68* 48*  CALCIUM 7.8* 8.3* 8.2* 8.1*  CREATININE 9.08* 7.69* 7.81* 6.73*  GFRNONAA 5* 6* 6* 8*  GFRAA 6* 8* 7* 9*    LIVER FUNCTION TESTS:  Recent Labs  07/03/14 0814 02/23/15 0900 04/05/15 0533 06/23/15 1840 06/24/15 0320  BILITOT 0.34  --  0.7  --   --   AST 10  --  14  --   --   ALT 13  --  30  --   --   ALKPHOS 52  --  43  --   --   PROT 7.3  --  6.6  --   --   ALBUMIN 3.2* 3.2* 2.4* 3.7 2.9*    TUMOR MARKERS: No results for input(s): AFPTM, CEA, CA199, CHROMGRNA in the last 8760 hours.  Assessment and Plan:  New to dialysis L arm dialysis graft placed 1 yr ago First use 3 days ago--no issue Re attempt last evening--slow flow  Now scheduled for shuntogram with poss pta/stent Poss dialysis catheter placement if necessary Risks and Benefits discussed with the patient including, but not limited to bleeding, infection, vascular injury, pneumothorax which may require chest tube placement, air embolism or even death All of the patient's questions were answered, patient is agreeable to proceed. Consent signed and in chart.   Thank you for this interesting consult.  I greatly enjoyed meeting [name] and look forward to participating in their care.  Signed: Lahna Nath A 06/25/2015, 12:13 PM   I spent a total of 40 Minutes    in face to face in clinical consultation, greater than 50% of which was counseling/coordinating care for L arm dialysis access shuntogram/poss HD  cath

## 2015-06-25 NOTE — Consult Note (Signed)
Hospital Consult    Reason for Consult:  Difficulty cannulating L [facility] AVF Referring Physician:  Melvia Heaps MRN #:  [id number]  History of Present Illness: This is a 64 y.o. male who presented to the hospital after having shortness of breath and anxiety after having his first HD treatment.    He was brought to the emergency room and found to have volume overload including pulmonary edema on CXR.  He underwent a left radiocephalic AVF on [id number] by Dr. Donnetta Hutching.  The pt was seen back on 04/29/14 at which time, he was found to have a good size fistula.  On physical exam, there were several tributary branches, but did not appear to be limiting flow through the cephalic vein.    The fistula was used once without difficulty.  Since then, it has been difficult to stick and had poor flow rates.  He is undergoing a fistulogram and placement of diatek catheter by IR today.  VVS is asked to evaluate his fistula for the large competing branches.  The patient has not had any steal sx.  The pt is on a beta blocker and CCB for HTN.  He is on a statin for his cholesterol management.  Past Medical History  Diagnosis Date  . Diabetes mellitus   . Hypertension   . Hyperlipidemia   . Chronic kidney disease   . PVD (peripheral vascular disease)   . Venous insufficiency   . Morbid obesity   . Chronic cystitis   . Arthritis   . UMBILICAL HERNIA [id number]    Qualifier: History of  By: [name] MD, Audelia Acton    . NEPHROLITHIASIS, HX OF 12/02/2009    Qualifier: Diagnosis of  By: [name], Cat    . Lymphedema   . CHF (congestive heart failure)   . Asthma     as a child  . H/O hiatal hernia     states it's been fixed  . Dry skin     Past Surgical History  Procedure Laterality Date  . R knee arthoscopic repair of meniscus    . Umbilical hernia repair    . Amputation      Right and left fifth toes.   . Popliteal to posterior tibial bypass      2006  . Eye surgery Bilateral     cataract and lens implant  .  Colonoscopy    . Hernia repair    . Av fistula placement Left 03/21/2014    Procedure: ARTERIOVENOUS (AV) FISTULA CREATION with ultrasound;  Surgeon: [name], MD;  Location: [place];  Service: Vascular;  Laterality: Left;    Allergies  Allergen Reactions  . Shrimp [Shellfish Allergy] Swelling  . Iodine Swelling    Prior to Admission medications   Medication Sig Start Date End Date Taking? Authorizing Provider  amLODipine (NORVASC) 10 MG tablet Take 10 mg by mouth every morning.   Yes Historical Provider, MD  aspirin EC 81 MG tablet Take 81 mg by mouth every morning.   Yes Historical Provider, MD  atorvastatin (LIPITOR) 40 MG tablet Take 1 tablet (40 mg total) by mouth daily at 6 PM. 03/11/14  Yes Frazier Richards, MD  carvedilol (COREG) 25 MG tablet Take 1 tablet (25 mg total) by mouth 2 (two) times daily with a meal. 03/11/14  Yes Frazier Richards, MD  colchicine 0.6 MG tablet Take 0.5 tablets (0.3 mg total) by mouth daily. Patient taking differently: Take 0.6 mg by mouth daily as needed (gout flare  ups).  04/06/15  Yes Barton Dubois, MD  darbepoetin (ARANESP) 100 MCG/0.5ML SOLN injection Inject 100 mcg into the skin every 14 (fourteen) days. [facility].   Yes Historical Provider, MD  hydrALAZINE (APRESOLINE) 100 MG tablet Take 1 tablet (100 mg total) by mouth 2 (two) times daily. take 1 tablet by mouth twice a day 01/20/14  Yes Waldemar Dickens, MD  calcitRIOL (ROCALTROL) 0.25 MCG capsule Take 0.25 mcg by mouth daily.  04/24/14   Historical Provider, MD  furosemide (LASIX) 80 MG tablet Take 80 mg by mouth 2 (two) times daily.    Historical Provider, MD  sodium bicarbonate 650 MG tablet Take 650 mg by mouth 3 (three) times daily.    Historical Provider, MD    History   Social History  . Marital Status: Legally Separated    Spouse Name: N/A  . Number of Children: 1  . Years of Education: N/A   Occupational History  .     Social History Main Topics  . Smoking status: Former Smoker     Quit date: 06/06/1979  . Smokeless tobacco: Former Systems developer  . Alcohol Use: No     Comment:   "years ago" , none now  . Drug Use: No  . Sexual Activity: No   Other Topics Concern  . Not on file   Social History Narrative   Lives alone.  Only son is deceased.      Family History  Problem Relation Age of Onset  . Diabetes Mother   . Heart disease Mother   . Diabetes Brother     ROS: [x]  Positive   [ ]  Negative   [ ]  All sytems reviewed and are negative  Cardiovascular: []  chest pain/pressure []  palpitations [x]  SOB lying flat [x]  DOE []  pain in legs while walking []  pain in legs at rest []  pain in legs at night []  non-healing ulcers []  hx of DVT [x]  swelling in legs  Pulmonary: []  productive cough []  asthma/wheezing []  home O2  Neurologic: []  weakness in []  arms []  legs []  numbness in []  arms []  legs []  hx of CVA []  mini stroke [] difficulty speaking or slurred speech []  temporary loss of vision in one eye []  dizziness  Hematologic: []  hx of cancer []  bleeding problems []  problems with blood clotting easily  Endocrine:   []  diabetes []  thyroid disease  GI []  vomiting blood []  blood in stool  GU: []  CKD/renal failure []  HD--[]  M/W/F or []  T/T/S []  burning with urination []  blood in urine  Psychiatric: []  anxiety []  depression  Musculoskeletal: []  arthritis []  joint pain  Integumentary: []  rashes [x]  ulcers  Constitutional: []  fever []  chills   Physical Examination  Filed Vitals:   06/25/15 1417  BP: 178/76  Pulse: 66  Temp:   Resp: 15   Body mass index is 36.46 kg/(m^2).  General:  WDWN in NAD Gait: Not observed HENT: WNL, normocephalic Pulmonary: normal non-labored breathing, without Rales, rhonchi,  wheezing Cardiac: regular, without  Murmurs, rubs or gallops; without carotid bruits Abdomen: soft, NT/ND, no masses Skin: both legs in unna boots, so can't evaluate feet Vascular Exam/Pulses:  Right Left  Radial 2+ (normal) 2+  (normal)  Ulnar 1+ (weak) 1+ (weak)  Brachial 2+ (normal) 2+ (normal)   Extremities: both legs in unna boots, L arm swollen with some echymosis from infilitration, palpable thrill, noticeably decreased in mid-segment and proximal Musculoskeletal: no muscle wasting or atrophy  Neurologic: A&O X 3; Appropriate Affect ;  SENSATION: normal; MOTOR FUNCTION:  moving all extremities equally. Speech is fluent/normal Psychiatric: Judgment intact, Mood & affect appropriate for pt's clinical situation Lymph : No Cervical, Axillary, or Inguinal lymphadenopathy    CBC    Component Value Date/Time   WBC 9.8 06/24/2015 0320   WBC 8.3 07/03/2014 0812   RBC 3.26* 06/24/2015 0320   RBC 3.37* 07/03/2014 0812   HGB 9.4* 06/24/2015 0320   HGB 9.4* 07/03/2014 0812   HCT 29.6* 06/24/2015 0320   HCT 29.0* 07/03/2014 0812   PLT 131* 06/24/2015 0320   PLT 136* 07/03/2014 0812   MCV 90.8 06/24/2015 0320   MCV 86.1 07/03/2014 0812   MCH 28.8 06/24/2015 0320   MCH 27.8 07/03/2014 0812   MCHC 31.8 06/24/2015 0320   MCHC 32.3 07/03/2014 0812   RDW 16.7* 06/24/2015 0320   RDW 16.3* 07/03/2014 0812   LYMPHSABS 1.5 04/04/2015 1216   LYMPHSABS 1.5 07/03/2014 0812   MONOABS 1.1* 04/04/2015 1216   MONOABS 0.7 07/03/2014 0812   EOSABS 0.6 04/04/2015 1216   EOSABS 0.6* 07/03/2014 0812   BASOSABS 0.0 04/04/2015 1216   BASOSABS 0.1 07/03/2014 0812    BMET    Component Value Date/Time   NA 142 06/25/2015 1215   NA 142 07/03/2014 0814   K 4.2 06/25/2015 1215   K 4.4 07/03/2014 0814   CL 106 06/25/2015 1215   CO2 26 06/25/2015 1215   CO2 20* 07/03/2014 0814   GLUCOSE 86 06/25/2015 1215   GLUCOSE 136 07/03/2014 0814   BUN 53* 06/25/2015 1215   BUN 93.6* 07/03/2014 0814   CREATININE 7.33* 06/25/2015 1215   CREATININE 7.3* 07/03/2014 0814   CREATININE 5.95* 03/04/2014 1113   CALCIUM 8.7* 06/25/2015 1215   CALCIUM 8.6 07/03/2014 0814   CALCIUM 7.5* 03/07/2014 1909   GFRNONAA 7* 06/25/2015 1215    GFRNONAA 31* 08/16/2012 1517   GFRAA 8* 06/25/2015 1215   GFRAA 36* 08/16/2012 1517    COAGS: Lab Results  Component Value Date   INR 1.18 06/25/2015   INR 1.01 09/21/2011   INR 1.01 06/24/2011   Radiology: Dg Chest Port 1 View  06/23/2015   CLINICAL DATA:  64 year old diabetic hypertensive male with acute shortness of breath that started during dialysis. Subsequent encounter.  EXAM: PORTABLE CHEST - 1 VIEW  COMPARISON:  04/04/2015.  FINDINGS: Cardiomegaly with pulmonary edema. Small pleural effusion suspected.  No gross pneumothorax.  Calcified slightly tortuous aorta.  IMPRESSION: Cardiomegaly with pulmonary edema. Small pleural effusion suspected.  Calcified slightly tortuous aorta.   Electronically Signed   By: Genia Del M.D.   On: 06/23/2015 16:13    ASSESSMENT/PLAN: This is a 64 y.o. male with ESRD requiring HD with L RC AVF with difficulty cannulating  - cont HD vs TDC - Will likely need further intervention to optimize L RC AVF  Leontine Locket, PA-C [facility] [phone]  Addendum  I have independently interviewed and examined the patient, and I agree with the physician assistant's findings.    L arm fistulogram (06/25/2015): final read not complete.   Based on my read of the fistulogram, the L RC AVF appears mature or near mature.  There are 3 large side-branches siphoning flow off the radiocephalic arteriovenous fistula which drains into the brachial/basilic venous system.  The side-branches likely account for the findings on physical exam.  - Further dialysis would help with fluid status, which would help with surgical exposure - Will plan on Side-branch ligation of L RC  AVF on Monday to force the blood flow into the main channel of the fistula, leading to possibly further dilation of the fistula. - Would delay cannulation for another 2 weeks after side-branch ligation to allow further fluid status optimization and healing of side-branch exposure  incisions.  Adele Barthel, MD [facility] Office: [phone] Pager: [phone]  06/25/2015, 3:04 PM

## 2015-06-25 NOTE — Clinical Documentation Improvement (Signed)
Please specify diagnosis related to below supporting documentation, if appropriate.   Possible Clinical Conditions? Acute Blood Loss Anemia Anemia secondary to renal failure Acute on chronic blood loss anemia Other Condition Cannot Clinically Determine   Supporting Information:  Per 06/24/15 MD progress note  & 06/23/15 Consult note =  Patient has anemia with Hb 11.5.    Patient's labs during this admission Component     Latest Ref Rng 06/23/2015         4:10 PM  Hemoglobin     13.0 - 17.0 g/dL 11.0 (L)  HCT     39.0 - 52.0 % 34.8 (L)   Component     Latest Ref Rng 06/24/2015         3:20 AM  Hemoglobin     13.0 - 17.0 g/dL 9.4 (L)  HCT     39.0 - 52.0 % 29.6 (L)     Thank You, Serena Colonel ,RN Clinical Documentation Specialist:  [place] Information Management

## 2015-06-26 DIAGNOSIS — T82590D Other mechanical complication of surgically created arteriovenous fistula, subsequent encounter: Secondary | ICD-10-CM

## 2015-06-26 LAB — GLUCOSE, CAPILLARY
GLUCOSE-CAPILLARY: 118 mg/dL — AB (ref 65–99)
Glucose-Capillary: 118 mg/dL — ABNORMAL HIGH (ref 65–99)
Glucose-Capillary: 92 mg/dL (ref 65–99)
Glucose-Capillary: 142 mg/dL — ABNORMAL HIGH (ref 65–99)
Glucose-Capillary: 121 mg/dL — ABNORMAL HIGH (ref 65–99)

## 2015-06-26 LAB — RENAL FUNCTION PANEL
ALBUMIN: 3 g/dL — AB (ref 3.5–5.0)
ANION GAP: 9 (ref 5–15)
BUN: 35 mg/dL — ABNORMAL HIGH (ref 6–20)
CHLORIDE: 102 mmol/L (ref 101–111)
CO2: 28 mmol/L (ref 22–32)
Calcium: 8.2 mg/dL — ABNORMAL LOW (ref 8.9–10.3)
Creatinine, Ser: 5.87 mg/dL — ABNORMAL HIGH (ref 0.61–1.24)
GFR calc non Af Amer: 9 mL/min — ABNORMAL LOW (ref >60)
GFR, EST AFRICAN AMERICAN: 10 mL/min — AB (ref >60)
GLUCOSE: 95 mg/dL (ref 65–99)
Phosphorus: 4.6 mg/dL (ref 2.5–4.6)
Potassium: 4.1 mmol/L (ref 3.5–5.1)
SODIUM: 139 mmol/L (ref 135–145)

## 2015-06-26 LAB — CBC
HEMATOCRIT: 30.9 % — AB (ref 39.0–52.0)
HEMOGLOBIN: 9.6 g/dL — AB (ref 13.0–17.0)
MCH: 28.2 pg (ref 26.0–34.0)
MCHC: 31.1 g/dL (ref 30.0–36.0)
MCV: 90.6 fL (ref 78.0–100.0)
Platelets: 111 10*3/uL — ABNORMAL LOW (ref 150–400)
RBC: 3.41 MIL/uL — ABNORMAL LOW (ref 4.22–5.81)
RDW: 15.8 % — ABNORMAL HIGH (ref 11.5–15.5)
WBC: 9.2 10*3/uL (ref 4.0–10.5)

## 2015-06-26 LAB — TSH: TSH: 3.764 u[IU]/mL (ref 0.350–4.500)

## 2015-06-26 NOTE — Procedures (Signed)
I was present at this session. He feels better and is breathing better. Will continue to remove fluid and planfor another HD on SAT.  Edema in LE improving. Estanislado Emms

## 2015-06-26 NOTE — Progress Notes (Signed)
Family Medicine Teaching Service Daily Progress Note Intern Pager: [phone]  Patient name: [name] Medical record number: [id number] Date of birth: 07-10-1951 Age: 64 y.o. Gender: male  Primary Care Provider: Lucrezia Starch, MD Consultants: nephrology Code Status: full  Pt Overview and Major Events to Date:  7/12: Admitted to floor 7/13: HD ~5 L removed  7/14: HD- A-fib and PVCs   Assessment and Plan:  [name] is a 64 y.o. male presenting with SOB following first dialysis session earlier today. PMH is significant for HTN, CHF, DM2, ESRD and MGUS  Dyspnea/volume overload/CHF/ESRD: SOB following first dialysis session yesterday, hypervolemic on exam, orthopnea and PND stable, CXR with pulm edema. No chest pain. Acute onset likely related to anxiety and discomfort following first dialysis session - admit to inpatient, Dr. Ree Kida attending - HD on 7/13 AM; 4L removed, 7/13 PM; 1L removed, needle problems so it was stopped early - F/u with nephrology consult - continue home CHF/HTN meds: coreg and amlodipine. Per renal: hold lasix, begin sodium bicarb, decrease hydralazine from 100 to 54m - ED EKG w/o acute changes but poor quality, trop neg, repeat EKG -Monitor O2. Pt currently on RA at 100% O2 sat -Echo 7/15: Left ventricle:  mild LVH. EF 55% -f/u TSH   DM: Last A1c 6.6 last month, on glipizide only at home - hold home glipizide - sensitive SSI  Chronic venous insufficiency: gets weekly unna boot changes at wound care - continue Unna boots - f/u with wound care  HTN; Patient has been hypertensive. Systolic [id number] Diastolic WNL -Will leave medications as is and let nephrologist adjust    FEN/GI: renal/carb mod diet, SLIV Prophylaxis: SQ heparin Disposition: stable  Subjective:  Patient was in dialysis, will need to see him later today. Per nurse, last night patient had a-fib and PVC while in dialysis. Will need to explore possible causes.    Objective: Temp:  [97.9 F (36.6 C)-99 F (37.2 C)] 99 F (37.2 C) (07/15 0529) Pulse Rate:  [59-97] 63 (07/15 0529) Resp:  [15-23] 20 (07/15 0529) BP: (156-191)/(58-142) 172/84 mmHg (07/15 0529) SpO2:  [97 %-100 %] 100 % (07/15 0529) Weight:  [249 lb (112.946 kg)-262 lb 5.6 oz (119 kg)] 249 lb (112.946 kg) (07/15 0529) Physical Exam: deferred  Laboratory:  Recent Labs Lab 06/23/15 1610 06/24/15 0320  WBC 11.5* 9.8  HGB 11.0* 9.4*  HCT 34.8* 29.6*  PLT 139* 131*    Recent Labs Lab 06/23/15 1840 06/24/15 0320 06/25/15 1215  NA 139 140 142  K 4.1 3.8 4.2  CL 103 103 106  CO2 '25 29 26  ' BUN 68* 48* 53*  CREATININE 7.81* 6.73* 7.33*  CALCIUM 8.2* 8.1* 8.7*  GLUCOSE 114* 127* 86    Imaging/Diagnostic Tests: Ir Fluoro Guide Cv Line Right  06/25/2015   INDICATION: History of chronic renal insufficiency, now progressed to end-stage renal disease.  Patient had left forearm native AV fistula created approximately 1 year ago in anticipation of impending dialysis access need. The patient was able to received limited dialysis via this left upper extremity fistula earlier this week [facility] though was admitted with congestive heart failure where inpatient attempted cannulation of the fistula ultimately proved unsuccessful.  As such, request made for a diagnostic left upper extremity fistulagram as well as potential hemodialysis catheter placement.  EXAM: 1. DIALYSIS AV SHUNTOGRAM/FISTULAGRAM 2. ULTRASOUND FLUOROSCOPIC GUIDED TUNNELED HEMODIALYSIS CATHETER PLACEMENT  COMPARISON:  None.  MEDICATIONS: Versed 1 mg IV; Fentanyl 50 mcg  IV  CONTRAST:  40 cc Omnipaque 300  ANESTHESIA/SEDATION: Sedation time  10 minutes  FLUOROSCOPY TIME:  1 minute 24 seconds (39.7 mGy)  COMPLICATIONS: None immediate  PROCEDURE: The skin overlying the left forearm arm dialysis graft was prepped with Betadine in a sterile fashion, and a sterile drape was applied covering the operative  field. A diagnostic shunt study was performed via an 18 gauge angiocatheter introduced into venous outflow. Venous drainage was assessed to the level of the central veins in the chest. Proximal shunt was studied by reflux maneuver with temporary compression of venous outflow.  Findings from the diagnostic fistulagram were discussed with referring nephrologist, Dr. Melvia Heaps, and the decision was made to proceed with tunneled dialysis catheter placement.  The angiocath was removed and hemostasis was achieved with manual compression. A dressing was placed. The patient tolerated the procedure well without immediate post procedural complication.  The right neck and chest were prepped with chlorhexidine in a sterile fashion, and a sterile drape was applied covering the operative field. Maximum barrier sterile technique with sterile gowns and gloves were used for the procedure. A timeout was performed prior to the initiation of the procedure.  After creating a small venotomy incision, a micropuncture kit was utilized to access the right internal jugular vein under direct, real-time ultrasound guidance after the overlying soft tissues were anesthetized with 1% lidocaine with epinephrine. Ultrasound image documentation was performed. The microwire was kinked to measure appropriate catheter length. A stiff Glidewire was advanced to the level of the IVC and the micropuncture sheath was exchanged for a peel-away sheath. A hemosplit tunneled hemodialysis catheter measuring 23 cm from tip to cuff was tunneled in a retrograde fashion from the anterior chest wall to the venotomy incision.  The catheter was then placed through the peel-away sheath with tips ultimately positioned within the superior aspect of the right atrium. Final catheter positioning was confirmed and documented with a spot radiographic image. The catheter aspirates and flushes normally. The catheter was flushed with appropriate volume heparin dwells.  The catheter  exit site was secured with a 0-Prolene retention suture. The venotomy incision was closed with an interrupted 4-0 Vicryl, Dermabond and Steri-strips. Dressings were applied. The patient tolerated the procedure well without immediate post procedural complication.  FINDINGS: The patient's left forearm native radial-cephalic AV fistula is widely patent. There is hypertrophy of several competing veins noted at the level of the mid forearm.  The fistula drains to the deep venous system of the left upper extremity at the level of the antecubital fossa.  The central venous system of the left upper extremity is widely patent to the superior aspect of the right atrium.  Reflux fistulagram demonstrates hypertrophy of several competing venous collaterals at the level of the distal forearm.  The arterial limb and anastomosis are widely patent.  After tunneled hemodialysis catheter placement, the tips of the dialysis catheter terminate within the superior aspect of the right atrium.  IMPRESSION: 1. The left forearm native AV fistula is widely patent though there are multiple hypertrophied competing venous collaterals noted at the level of mid and distal forearm. Given history of difficulty with fistula cannulation, surgical or percutaneous ligation of these competing venous collaterals could be performed as clinically indicated. 2. Successful ultrasound and fluoroscopic guided placement tunneled right internal jugular approach dialysis catheter placement with tips terminate within the superior aspect the right atrium. The dialysis catheter is ready for immediate use.  ACCESS: Given history of difficulty with fistula cannulation, surgical  or percutaneous ligation of competing venous collaterals at the level of the mid and distal forearm could be performed to improve fistula maturation as clinically indicated.  This was discussed with referring nephrologist, Dr. Melvia Heaps, immediately following the diagnostic fistulagram.    Electronically Signed   By: Sandi Mariscal M.D.   On: 06/25/2015 16:11   Ir US Guide Vasc Access Right  06/25/2015   INDICATION: History of chronic renal insufficiency, now progressed to end-stage renal disease.  Patient had left forearm native AV fistula created approximately 1 year ago in anticipation of impending dialysis access need. The patient was able to received limited dialysis via this left upper extremity fistula earlier this week [facility] though was admitted with congestive heart failure where inpatient attempted cannulation of the fistula ultimately proved unsuccessful.  As such, request made for a diagnostic left upper extremity fistulagram as well as potential hemodialysis catheter placement.  EXAM: 1. DIALYSIS AV SHUNTOGRAM/FISTULAGRAM 2. ULTRASOUND FLUOROSCOPIC GUIDED TUNNELED HEMODIALYSIS CATHETER PLACEMENT  COMPARISON:  None.  MEDICATIONS: Versed 1 mg IV; Fentanyl 50 mcg IV  CONTRAST:  40 cc Omnipaque 300  ANESTHESIA/SEDATION: Sedation time  10 minutes  FLUOROSCOPY TIME:  1 minute 24 seconds (01.0 mGy)  COMPLICATIONS: None immediate  PROCEDURE: The skin overlying the left forearm arm dialysis graft was prepped with Betadine in a sterile fashion, and a sterile drape was applied covering the operative field. A diagnostic shunt study was performed via an 18 gauge angiocatheter introduced into venous outflow. Venous drainage was assessed to the level of the central veins in the chest. Proximal shunt was studied by reflux maneuver with temporary compression of venous outflow.  Findings from the diagnostic fistulagram were discussed with referring nephrologist, Dr. Melvia Heaps, and the decision was made to proceed with tunneled dialysis catheter placement.  The angiocath was removed and hemostasis was achieved with manual compression. A dressing was placed. The patient tolerated the procedure well without immediate post procedural complication.  The right neck and chest were prepped  with chlorhexidine in a sterile fashion, and a sterile drape was applied covering the operative field. Maximum barrier sterile technique with sterile gowns and gloves were used for the procedure. A timeout was performed prior to the initiation of the procedure.  After creating a small venotomy incision, a micropuncture kit was utilized to access the right internal jugular vein under direct, real-time ultrasound guidance after the overlying soft tissues were anesthetized with 1% lidocaine with epinephrine. Ultrasound image documentation was performed. The microwire was kinked to measure appropriate catheter length. A stiff Glidewire was advanced to the level of the IVC and the micropuncture sheath was exchanged for a peel-away sheath. A hemosplit tunneled hemodialysis catheter measuring 23 cm from tip to cuff was tunneled in a retrograde fashion from the anterior chest wall to the venotomy incision.  The catheter was then placed through the peel-away sheath with tips ultimately positioned within the superior aspect of the right atrium. Final catheter positioning was confirmed and documented with a spot radiographic image. The catheter aspirates and flushes normally. The catheter was flushed with appropriate volume heparin dwells.  The catheter exit site was secured with a 0-Prolene retention suture. The venotomy incision was closed with an interrupted 4-0 Vicryl, Dermabond and Steri-strips. Dressings were applied. The patient tolerated the procedure well without immediate post procedural complication.  FINDINGS: The patient's left forearm native radial-cephalic AV fistula is widely patent. There is hypertrophy of several competing veins noted at the level of the  mid forearm.  The fistula drains to the deep venous system of the left upper extremity at the level of the antecubital fossa.  The central venous system of the left upper extremity is widely patent to the superior aspect of the right atrium.  Reflux  fistulagram demonstrates hypertrophy of several competing venous collaterals at the level of the distal forearm.  The arterial limb and anastomosis are widely patent.  After tunneled hemodialysis catheter placement, the tips of the dialysis catheter terminate within the superior aspect of the right atrium.  IMPRESSION: 1. The left forearm native AV fistula is widely patent though there are multiple hypertrophied competing venous collaterals noted at the level of mid and distal forearm. Given history of difficulty with fistula cannulation, surgical or percutaneous ligation of these competing venous collaterals could be performed as clinically indicated. 2. Successful ultrasound and fluoroscopic guided placement tunneled right internal jugular approach dialysis catheter placement with tips terminate within the superior aspect the right atrium. The dialysis catheter is ready for immediate use.  ACCESS: Given history of difficulty with fistula cannulation, surgical or percutaneous ligation of competing venous collaterals at the level of the mid and distal forearm could be performed to improve fistula maturation as clinically indicated.  This was discussed with referring nephrologist, Dr. Melvia Heaps, immediately following the diagnostic fistulagram.   Electronically Signed   By: Sandi Mariscal M.D.   On: 06/25/2015 16:11   Ir Shuntogram/ Fistulagram Left Mod Sed  06/25/2015   INDICATION: History of chronic renal insufficiency, now progressed to end-stage renal disease.  Patient had left forearm native AV fistula created approximately 1 year ago in anticipation of impending dialysis access need. The patient was able to received limited dialysis via this left upper extremity fistula earlier this week [facility] though was admitted with congestive heart failure where inpatient attempted cannulation of the fistula ultimately proved unsuccessful.  As such, request made for a diagnostic left upper extremity  fistulagram as well as potential hemodialysis catheter placement.  EXAM: 1. DIALYSIS AV SHUNTOGRAM/FISTULAGRAM 2. ULTRASOUND FLUOROSCOPIC GUIDED TUNNELED HEMODIALYSIS CATHETER PLACEMENT  COMPARISON:  None.  MEDICATIONS: Versed 1 mg IV; Fentanyl 50 mcg IV  CONTRAST:  40 cc Omnipaque 300  ANESTHESIA/SEDATION: Sedation time  10 minutes  FLUOROSCOPY TIME:  1 minute 24 seconds (10.3 mGy)  COMPLICATIONS: None immediate  PROCEDURE: The skin overlying the left forearm arm dialysis graft was prepped with Betadine in a sterile fashion, and a sterile drape was applied covering the operative field. A diagnostic shunt study was performed via an 18 gauge angiocatheter introduced into venous outflow. Venous drainage was assessed to the level of the central veins in the chest. Proximal shunt was studied by reflux maneuver with temporary compression of venous outflow.  Findings from the diagnostic fistulagram were discussed with referring nephrologist, Dr. Melvia Heaps, and the decision was made to proceed with tunneled dialysis catheter placement.  The angiocath was removed and hemostasis was achieved with manual compression. A dressing was placed. The patient tolerated the procedure well without immediate post procedural complication.  The right neck and chest were prepped with chlorhexidine in a sterile fashion, and a sterile drape was applied covering the operative field. Maximum barrier sterile technique with sterile gowns and gloves were used for the procedure. A timeout was performed prior to the initiation of the procedure.  After creating a small venotomy incision, a micropuncture kit was utilized to access the right internal jugular vein under direct, real-time ultrasound guidance after the overlying  soft tissues were anesthetized with 1% lidocaine with epinephrine. Ultrasound image documentation was performed. The microwire was kinked to measure appropriate catheter length. A stiff Glidewire was advanced to the level of the IVC  and the micropuncture sheath was exchanged for a peel-away sheath. A hemosplit tunneled hemodialysis catheter measuring 23 cm from tip to cuff was tunneled in a retrograde fashion from the anterior chest wall to the venotomy incision.  The catheter was then placed through the peel-away sheath with tips ultimately positioned within the superior aspect of the right atrium. Final catheter positioning was confirmed and documented with a spot radiographic image. The catheter aspirates and flushes normally. The catheter was flushed with appropriate volume heparin dwells.  The catheter exit site was secured with a 0-Prolene retention suture. The venotomy incision was closed with an interrupted 4-0 Vicryl, Dermabond and Steri-strips. Dressings were applied. The patient tolerated the procedure well without immediate post procedural complication.  FINDINGS: The patient's left forearm native radial-cephalic AV fistula is widely patent. There is hypertrophy of several competing veins noted at the level of the mid forearm.  The fistula drains to the deep venous system of the left upper extremity at the level of the antecubital fossa.  The central venous system of the left upper extremity is widely patent to the superior aspect of the right atrium.  Reflux fistulagram demonstrates hypertrophy of several competing venous collaterals at the level of the distal forearm.  The arterial limb and anastomosis are widely patent.  After tunneled hemodialysis catheter placement, the tips of the dialysis catheter terminate within the superior aspect of the right atrium.  IMPRESSION: 1. The left forearm native AV fistula is widely patent though there are multiple hypertrophied competing venous collaterals noted at the level of mid and distal forearm. Given history of difficulty with fistula cannulation, surgical or percutaneous ligation of these competing venous collaterals could be performed as clinically indicated. 2. Successful ultrasound  and fluoroscopic guided placement tunneled right internal jugular approach dialysis catheter placement with tips terminate within the superior aspect the right atrium. The dialysis catheter is ready for immediate use.  ACCESS: Given history of difficulty with fistula cannulation, surgical or percutaneous ligation of competing venous collaterals at the level of the mid and distal forearm could be performed to improve fistula maturation as clinically indicated.  This was discussed with referring nephrologist, Dr. Melvia Heaps, immediately following the diagnostic fistulagram.   Electronically Signed   By: Sandi Mariscal M.D.   On: 06/25/2015 16:11     [name], MD 06/26/2015, 7:25 AM PGY-1, [place] Intern pager: [phone], text pages welcome

## 2015-06-27 LAB — CBC
HCT: 30.3 % — ABNORMAL LOW (ref 39.0–52.0)
HEMOGLOBIN: 9.5 g/dL — AB (ref 13.0–17.0)
MCH: 28.3 pg (ref 26.0–34.0)
MCHC: 31.4 g/dL (ref 30.0–36.0)
MCV: 90.2 fL (ref 78.0–100.0)
Platelets: 118 10*3/uL — ABNORMAL LOW (ref 150–400)
RBC: 3.36 MIL/uL — AB (ref 4.22–5.81)
RDW: 15.6 % — ABNORMAL HIGH (ref 11.5–15.5)
WBC: 9.8 10*3/uL (ref 4.0–10.5)

## 2015-06-27 LAB — RENAL FUNCTION PANEL
Albumin: 2.9 g/dL — ABNORMAL LOW (ref 3.5–5.0)
Anion gap: 8 (ref 5–15)
BUN: 33 mg/dL — ABNORMAL HIGH (ref 6–20)
CO2: 28 mmol/L (ref 22–32)
Calcium: 8.4 mg/dL — ABNORMAL LOW (ref 8.9–10.3)
Chloride: 100 mmol/L — ABNORMAL LOW (ref 101–111)
Creatinine, Ser: 5.8 mg/dL — ABNORMAL HIGH (ref 0.61–1.24)
GFR, EST AFRICAN AMERICAN: 11 mL/min — AB (ref >60)
GFR, EST NON AFRICAN AMERICAN: 9 mL/min — AB (ref >60)
Glucose, Bld: 164 mg/dL — ABNORMAL HIGH (ref 65–99)
PHOSPHORUS: 4.7 mg/dL — AB (ref 2.5–4.6)
Potassium: 3.5 mmol/L (ref 3.5–5.1)
SODIUM: 136 mmol/L (ref 135–145)

## 2015-06-27 LAB — GLUCOSE, CAPILLARY
GLUCOSE-CAPILLARY: 112 mg/dL — AB (ref 65–99)
GLUCOSE-CAPILLARY: 121 mg/dL — AB (ref 65–99)
GLUCOSE-CAPILLARY: 119 mg/dL — AB (ref 65–99)
Glucose-Capillary: 118 mg/dL — ABNORMAL HIGH (ref 65–99)

## 2015-06-27 NOTE — Progress Notes (Signed)
Family Medicine Teaching Service Daily Progress Note Intern Pager: [phone]  Patient name: [name] Medical record number: [id number] Date of birth: 11/02/1951 Age: 65 y.o. Gender: male  Primary Care Provider: Lucrezia Starch, MD Consultants: nephrology Code Status: full  Pt Overview and Major Events to Date:  7/12: Admitted to floor 7/13: HD ~5 L removed  7/14: HD- A-fib and PVCs  7/15: HD 7/16: HD: breathing and LE edema improved  Assessment and Plan: [name] is a 64 y.o. male presenting with volume overload and SOB following first HD session 7/12. PMH is significant for HTN, CHF, DM2, ESRD and MGUS  ESRD with volume overload: Improving symptoms with daily HD. Likely component of anxiety and discomfort. Will continue TTS HD at [facility] at discharge. Access: tunneled HD cath, resting AVF; management per vascular surgery.  - Per nephrology - Holding lasix, lowered hydralazine while taking volume at HD; started sodium bicarb  Chronic diastolic CHF: Repeat echo here unchanged: LVH, EF 55%, G1DD.  - Cautious volume removal; continue home medications  DM: Hb A1c 6.6% June 2016 - hold home glipizide - sensitive SSI  Chronic venous insufficiency: gets weekly unna boot changes at wound care - continue Unna boots: Usually gets changed at [place] Wednesdays. Current boots placed 7/13.  - WOC consult  HTN; Patient has been hypertensive due to volume excess. - per nephrology at this time  FEN/GI: renal/carb mod diet, saline lock IV Prophylaxis: SQ heparin  Disposition: Continue adjusting volume status, likely to OR for AVF revision early next week.  Subjective:  Patient reports vastly improved dyspnea and some improvement in swelling.   Objective: Temp:  [98.1 F (36.7 C)-98.7 F (37.1 C)] 98.1 F (36.7 C) (07/16 0712) Pulse Rate:  [57-74] 70 (07/16 0712) Resp:  [13-20] 16 (07/16 0712) BP: (150-182)/(62-84) 150/84 mmHg (07/16 0712) SpO2:  [97 %-100 %] 100 %  (07/16 0712) Weight:  [248 lb 14.2 oz (112.894 kg)-251 lb 5.2 oz (114 kg)] 251 lb 5.2 oz (114 kg) (07/16 [id number]) Physical Exam:  Gen: Alert 64 y.o.male receiving HD HEENT: MMM, posterior oropharynx clear Pulm: Non-labored; scant bibasilar crackles, no wheezes  CV: Regular rate, no murmur appreciated; L forearm AVF w/+thrill and some swelling GI: + BS; soft, non-tender, non-distended Skin: Bilateral unna boots. Tunneled HD cath c/d/i on R chest wall.  Neuro: A&Ox3, speech normal  Laboratory:  Recent Labs Lab 06/23/15 1610 06/24/15 0320 06/26/15 0736  WBC 11.5* 9.8 9.2  HGB 11.0* 9.4* 9.6*  HCT 34.8* 29.6* 30.9*  PLT 139* 131* 111*    Recent Labs Lab 06/24/15 0320 06/25/15 1215 06/26/15 0736  NA 140 142 139  K 3.8 4.2 4.1  CL 103 106 102  CO2 29 26 28   BUN 48* 53* 35*  CREATININE 6.73* 7.33* 5.87*  CALCIUM 8.1* 8.7* 8.2*  GLUCOSE 127* 86 95    Imaging/Diagnostic Tests: No results found.   Patrecia Pour, MD 06/27/2015, 7:17 AM PGY-3, [place] Intern pager: [phone], text pages welcome

## 2015-06-27 NOTE — Progress Notes (Signed)
Report given to Dialysis nurse.   

## 2015-06-27 NOTE — Procedures (Signed)
I was present at this session. He feels better and is breathing better. Will continue to remove fluid during current treatment. Edema in LE improving. I think he is stable from renal perspective.  His weight is down significantly and has a new EDW, about 110Kg. Suzzanne Brunkhorst C

## 2015-06-28 DIAGNOSIS — M109 Gout, unspecified: Secondary | ICD-10-CM | POA: Diagnosis not present

## 2015-06-28 LAB — GLUCOSE, CAPILLARY
GLUCOSE-CAPILLARY: 101 mg/dL — AB (ref 65–99)
GLUCOSE-CAPILLARY: 183 mg/dL — AB (ref 65–99)
Glucose-Capillary: 101 mg/dL — ABNORMAL HIGH (ref 65–99)
Glucose-Capillary: 136 mg/dL — ABNORMAL HIGH (ref 65–99)

## 2015-06-28 MED ORDER — PREDNISONE 20 MG PO TABS
20.0000 mg | ORAL_TABLET | Freq: Every day | ORAL | Status: DC
  Administered 2015-06-28 – 2015-06-30 (×3): 20 mg via ORAL
  Filled 2015-06-28 (×4): qty 1

## 2015-06-28 NOTE — Progress Notes (Signed)
Family Medicine Teaching Service Daily Progress Note Intern Pager: [phone]  Patient name: [name] Medical record number: [id number] Date of birth: May 22, 1951 Age: 64 y.o. Gender: male  Primary Care Provider: Lucrezia Starch, MD Consultants: nephrology Code Status: full  Pt Overview and Major Events to Date:  7/12: Admitted to floor 7/13: HD ~5 L removed  7/14: HD- A-fib and PVCs  7/15: HD 7/16: HD  Assessment and Plan: [name] is a 64 y.o. male presenting with volume overload and SOB following first HD session 7/12. PMH is significant for HTN, CHF, DM2, ESRD and MGUS  ESRD with volume overload: Improving symptoms with daily HD. Likely component of anxiety and discomfort. Will continue TTS HD at [facility] at discharge. Access: tunneled HD cath, resting AVF; management per vascular surgery.  - Per nephrology, stable and ready for discharge following vascular procedure - Holding lasix, lowered hydralazine while taking volume at HD - Plan for AVF revision 7/18 per vascular  Knee pain: likely gout flair, warm, red and tender, h/o gout in fingers, no infectious symptoms - start prednisone 79m daily  Chronic diastolic CHF: Repeat echo here unchanged: LVH, EF 55%, G1DD.  - Cautious volume removal; continue home medications  DM: Hb A1c 6.6% June 2016 - hold home glipizide - sensitive SSI  Chronic venous insufficiency: gets weekly unna boot changes at wound care - continue Unna boots: [facility] Current boots placed 7/13.  - WOC consult  HTN; Patient has been hypertensive due to volume excess. - per nephrology at this time  FEN/GI: renal/carb mod diet, saline lock IV Prophylaxis: SQ heparin  Disposition: home when euvolemic and cleared by renal  Subjective:  Patient reports vastly improved dyspnea and swelling. No complaints this morning  Objective: Temp:  [97.1 F (36.2 C)-98 F (36.7 C)] 98 F (36.7 C) (07/17  0510) Pulse Rate:  [53-66] 66 (07/17 0510) Resp:  [18] 18 (07/17 0510) BP: (146-174)/(60-76) 173/63 mmHg (07/17 0510) SpO2:  [97 %-98 %] 98 % (07/17 0510) Weight:  [244 lb 11.4 oz (111 kg)-245 lb (111.131 kg)] 245 lb (111.131 kg) (07/17 0510) Physical Exam:  Gen: Alert 64 y.o.male lying in bed watching TV HEENT: MMM, posterior oropharynx clear Pulm: Non-labored; scant bibasilar crackles, no wheezes  CV: Regular rate, no murmur appreciated; L forearm AVF w/+thrill  GI: + BS; soft, non-tender, non-distended Skin: Bilateral unna boots. Tunneled HD cath c/d/i on R chest wall.  Neuro: A&Ox3, speech normal  Laboratory:  Recent Labs Lab 06/24/15 0320 06/26/15 0736 06/27/15 0736  WBC 9.8 9.2 9.8  HGB 9.4* 9.6* 9.5*  HCT 29.6* 30.9* 30.3*  PLT 131* 111* 118*    Recent Labs Lab 06/25/15 1215 06/26/15 0736 06/27/15 0736  NA 142 139 136  K 4.2 4.1 3.5  CL 106 102 100*  CO2 _0 BUN 53* 35* 33*  CREATININE 7.33* 5.87* 5.80*  CALCIUM 8.7* 8.2* 8.4*  GLUCOSE 86 95 164*    Imaging/Diagnostic Tests: Ir Fluoro Guide Cv Line Right  06/25/2015   INDICATION: History of chronic renal insufficiency, now progressed to end-stage renal disease.  Patient had left forearm native AV fistula created approximately 1 year ago in anticipation of impending dialysis access need. The patient was able to received limited dialysis via this left upper extremity fistula earlier this week [facility] though was admitted with congestive heart failure where inpatient attempted cannulation of the fistula ultimately proved unsuccessful.  As such, request  made for a diagnostic left upper extremity fistulagram as well as potential hemodialysis catheter placement.  EXAM: 1. DIALYSIS AV SHUNTOGRAM/FISTULAGRAM 2. ULTRASOUND FLUOROSCOPIC GUIDED TUNNELED HEMODIALYSIS CATHETER PLACEMENT  COMPARISON:  None.  MEDICATIONS: Versed 1 mg IV; Fentanyl 50 mcg IV  CONTRAST:  40 cc Omnipaque 300   ANESTHESIA/SEDATION: Sedation time  10 minutes  FLUOROSCOPY TIME:  1 minute 24 seconds (32.2 mGy)  COMPLICATIONS: None immediate  PROCEDURE: The skin overlying the left forearm arm dialysis graft was prepped with Betadine in a sterile fashion, and a sterile drape was applied covering the operative field. A diagnostic shunt study was performed via an 18 gauge angiocatheter introduced into venous outflow. Venous drainage was assessed to the level of the central veins in the chest. Proximal shunt was studied by reflux maneuver with temporary compression of venous outflow.  Findings from the diagnostic fistulagram were discussed with referring nephrologist, Dr. Melvia Heaps, and the decision was made to proceed with tunneled dialysis catheter placement.  The angiocath was removed and hemostasis was achieved with manual compression. A dressing was placed. The patient tolerated the procedure well without immediate post procedural complication.  The right neck and chest were prepped with chlorhexidine in a sterile fashion, and a sterile drape was applied covering the operative field. Maximum barrier sterile technique with sterile gowns and gloves were used for the procedure. A timeout was performed prior to the initiation of the procedure.  After creating a small venotomy incision, a micropuncture kit was utilized to access the right internal jugular vein under direct, real-time ultrasound guidance after the overlying soft tissues were anesthetized with 1% lidocaine with epinephrine. Ultrasound image documentation was performed. The microwire was kinked to measure appropriate catheter length. A stiff Glidewire was advanced to the level of the IVC and the micropuncture sheath was exchanged for a peel-away sheath. A hemosplit tunneled hemodialysis catheter measuring 23 cm from tip to cuff was tunneled in a retrograde fashion from the anterior chest wall to the venotomy incision.  The catheter was then placed through the peel-away  sheath with tips ultimately positioned within the superior aspect of the right atrium. Final catheter positioning was confirmed and documented with a spot radiographic image. The catheter aspirates and flushes normally. The catheter was flushed with appropriate volume heparin dwells.  The catheter exit site was secured with a 0-Prolene retention suture. The venotomy incision was closed with an interrupted 4-0 Vicryl, Dermabond and Steri-strips. Dressings were applied. The patient tolerated the procedure well without immediate post procedural complication.  FINDINGS: The patient's left forearm native radial-cephalic AV fistula is widely patent. There is hypertrophy of several competing veins noted at the level of the mid forearm.  The fistula drains to the deep venous system of the left upper extremity at the level of the antecubital fossa.  The central venous system of the left upper extremity is widely patent to the superior aspect of the right atrium.  Reflux fistulagram demonstrates hypertrophy of several competing venous collaterals at the level of the distal forearm.  The arterial limb and anastomosis are widely patent.  After tunneled hemodialysis catheter placement, the tips of the dialysis catheter terminate within the superior aspect of the right atrium.  IMPRESSION: 1. The left forearm native AV fistula is widely patent though there are multiple hypertrophied competing venous collaterals noted at the level of mid and distal forearm. Given history of difficulty with fistula cannulation, surgical or percutaneous ligation of these competing venous collaterals could be performed as clinically indicated. 2.  Successful ultrasound and fluoroscopic guided placement tunneled right internal jugular approach dialysis catheter placement with tips terminate within the superior aspect the right atrium. The dialysis catheter is ready for immediate use.  ACCESS: Given history of difficulty with fistula cannulation,  surgical or percutaneous ligation of competing venous collaterals at the level of the mid and distal forearm could be performed to improve fistula maturation as clinically indicated.  This was discussed with referring nephrologist, Dr. Melvia Heaps, immediately following the diagnostic fistulagram.   Electronically Signed   By: Sandi Mariscal M.D.   On: 06/25/2015 16:11   Ir US Guide Vasc Access Right  06/25/2015   INDICATION: History of chronic renal insufficiency, now progressed to end-stage renal disease.  Patient had left forearm native AV fistula created approximately 1 year ago in anticipation of impending dialysis access need. The patient was able to received limited dialysis via this left upper extremity fistula earlier this week [facility] though was admitted with congestive heart failure where inpatient attempted cannulation of the fistula ultimately proved unsuccessful.  As such, request made for a diagnostic left upper extremity fistulagram as well as potential hemodialysis catheter placement.  EXAM: 1. DIALYSIS AV SHUNTOGRAM/FISTULAGRAM 2. ULTRASOUND FLUOROSCOPIC GUIDED TUNNELED HEMODIALYSIS CATHETER PLACEMENT  COMPARISON:  None.  MEDICATIONS: Versed 1 mg IV; Fentanyl 50 mcg IV  CONTRAST:  40 cc Omnipaque 300  ANESTHESIA/SEDATION: Sedation time  10 minutes  FLUOROSCOPY TIME:  1 minute 24 seconds (95.1 mGy)  COMPLICATIONS: None immediate  PROCEDURE: The skin overlying the left forearm arm dialysis graft was prepped with Betadine in a sterile fashion, and a sterile drape was applied covering the operative field. A diagnostic shunt study was performed via an 18 gauge angiocatheter introduced into venous outflow. Venous drainage was assessed to the level of the central veins in the chest. Proximal shunt was studied by reflux maneuver with temporary compression of venous outflow.  Findings from the diagnostic fistulagram were discussed with referring nephrologist, Dr. Melvia Heaps, and the decision  was made to proceed with tunneled dialysis catheter placement.  The angiocath was removed and hemostasis was achieved with manual compression. A dressing was placed. The patient tolerated the procedure well without immediate post procedural complication.  The right neck and chest were prepped with chlorhexidine in a sterile fashion, and a sterile drape was applied covering the operative field. Maximum barrier sterile technique with sterile gowns and gloves were used for the procedure. A timeout was performed prior to the initiation of the procedure.  After creating a small venotomy incision, a micropuncture kit was utilized to access the right internal jugular vein under direct, real-time ultrasound guidance after the overlying soft tissues were anesthetized with 1% lidocaine with epinephrine. Ultrasound image documentation was performed. The microwire was kinked to measure appropriate catheter length. A stiff Glidewire was advanced to the level of the IVC and the micropuncture sheath was exchanged for a peel-away sheath. A hemosplit tunneled hemodialysis catheter measuring 23 cm from tip to cuff was tunneled in a retrograde fashion from the anterior chest wall to the venotomy incision.  The catheter was then placed through the peel-away sheath with tips ultimately positioned within the superior aspect of the right atrium. Final catheter positioning was confirmed and documented with a spot radiographic image. The catheter aspirates and flushes normally. The catheter was flushed with appropriate volume heparin dwells.  The catheter exit site was secured with a 0-Prolene retention suture. The venotomy incision was closed with an interrupted 4-0 Vicryl, Dermabond  and Steri-strips. Dressings were applied. The patient tolerated the procedure well without immediate post procedural complication.  FINDINGS: The patient's left forearm native radial-cephalic AV fistula is widely patent. There is hypertrophy of several  competing veins noted at the level of the mid forearm.  The fistula drains to the deep venous system of the left upper extremity at the level of the antecubital fossa.  The central venous system of the left upper extremity is widely patent to the superior aspect of the right atrium.  Reflux fistulagram demonstrates hypertrophy of several competing venous collaterals at the level of the distal forearm.  The arterial limb and anastomosis are widely patent.  After tunneled hemodialysis catheter placement, the tips of the dialysis catheter terminate within the superior aspect of the right atrium.  IMPRESSION: 1. The left forearm native AV fistula is widely patent though there are multiple hypertrophied competing venous collaterals noted at the level of mid and distal forearm. Given history of difficulty with fistula cannulation, surgical or percutaneous ligation of these competing venous collaterals could be performed as clinically indicated. 2. Successful ultrasound and fluoroscopic guided placement tunneled right internal jugular approach dialysis catheter placement with tips terminate within the superior aspect the right atrium. The dialysis catheter is ready for immediate use.  ACCESS: Given history of difficulty with fistula cannulation, surgical or percutaneous ligation of competing venous collaterals at the level of the mid and distal forearm could be performed to improve fistula maturation as clinically indicated.  This was discussed with referring nephrologist, Dr. Melvia Heaps, immediately following the diagnostic fistulagram.   Electronically Signed   By: Sandi Mariscal M.D.   On: 06/25/2015 16:11   Dg Chest Port 1 View  06/23/2015   CLINICAL DATA:  64 year old diabetic hypertensive male with acute shortness of breath that started during dialysis. Subsequent encounter.  EXAM: PORTABLE CHEST - 1 VIEW  COMPARISON:  04/04/2015.  FINDINGS: Cardiomegaly with pulmonary edema. Small pleural effusion suspected.  No gross  pneumothorax.  Calcified slightly tortuous aorta.  IMPRESSION: Cardiomegaly with pulmonary edema. Small pleural effusion suspected.  Calcified slightly tortuous aorta.   Electronically Signed   By: Genia Del M.D.   On: 06/23/2015 16:13   Ir Shuntogram/ Fistulagram Left Mod Sed  06/25/2015   INDICATION: History of chronic renal insufficiency, now progressed to end-stage renal disease.  Patient had left forearm native AV fistula created approximately 1 year ago in anticipation of impending dialysis access need. The patient was able to received limited dialysis via this left upper extremity fistula earlier this week [facility] though was admitted with congestive heart failure where inpatient attempted cannulation of the fistula ultimately proved unsuccessful.  As such, request made for a diagnostic left upper extremity fistulagram as well as potential hemodialysis catheter placement.  EXAM: 1. DIALYSIS AV SHUNTOGRAM/FISTULAGRAM 2. ULTRASOUND FLUOROSCOPIC GUIDED TUNNELED HEMODIALYSIS CATHETER PLACEMENT  COMPARISON:  None.  MEDICATIONS: Versed 1 mg IV; Fentanyl 50 mcg IV  CONTRAST:  40 cc Omnipaque 300  ANESTHESIA/SEDATION: Sedation time  10 minutes  FLUOROSCOPY TIME:  1 minute 24 seconds (25.4 mGy)  COMPLICATIONS: None immediate  PROCEDURE: The skin overlying the left forearm arm dialysis graft was prepped with Betadine in a sterile fashion, and a sterile drape was applied covering the operative field. A diagnostic shunt study was performed via an 18 gauge angiocatheter introduced into venous outflow. Venous drainage was assessed to the level of the central veins in the chest. Proximal shunt was studied by reflux maneuver with temporary  compression of venous outflow.  Findings from the diagnostic fistulagram were discussed with referring nephrologist, Dr. Melvia Heaps, and the decision was made to proceed with tunneled dialysis catheter placement.  The angiocath was removed and hemostasis was  achieved with manual compression. A dressing was placed. The patient tolerated the procedure well without immediate post procedural complication.  The right neck and chest were prepped with chlorhexidine in a sterile fashion, and a sterile drape was applied covering the operative field. Maximum barrier sterile technique with sterile gowns and gloves were used for the procedure. A timeout was performed prior to the initiation of the procedure.  After creating a small venotomy incision, a micropuncture kit was utilized to access the right internal jugular vein under direct, real-time ultrasound guidance after the overlying soft tissues were anesthetized with 1% lidocaine with epinephrine. Ultrasound image documentation was performed. The microwire was kinked to measure appropriate catheter length. A stiff Glidewire was advanced to the level of the IVC and the micropuncture sheath was exchanged for a peel-away sheath. A hemosplit tunneled hemodialysis catheter measuring 23 cm from tip to cuff was tunneled in a retrograde fashion from the anterior chest wall to the venotomy incision.  The catheter was then placed through the peel-away sheath with tips ultimately positioned within the superior aspect of the right atrium. Final catheter positioning was confirmed and documented with a spot radiographic image. The catheter aspirates and flushes normally. The catheter was flushed with appropriate volume heparin dwells.  The catheter exit site was secured with a 0-Prolene retention suture. The venotomy incision was closed with an interrupted 4-0 Vicryl, Dermabond and Steri-strips. Dressings were applied. The patient tolerated the procedure well without immediate post procedural complication.  FINDINGS: The patient's left forearm native radial-cephalic AV fistula is widely patent. There is hypertrophy of several competing veins noted at the level of the mid forearm.  The fistula drains to the deep venous system of the left  upper extremity at the level of the antecubital fossa.  The central venous system of the left upper extremity is widely patent to the superior aspect of the right atrium.  Reflux fistulagram demonstrates hypertrophy of several competing venous collaterals at the level of the distal forearm.  The arterial limb and anastomosis are widely patent.  After tunneled hemodialysis catheter placement, the tips of the dialysis catheter terminate within the superior aspect of the right atrium.  IMPRESSION: 1. The left forearm native AV fistula is widely patent though there are multiple hypertrophied competing venous collaterals noted at the level of mid and distal forearm. Given history of difficulty with fistula cannulation, surgical or percutaneous ligation of these competing venous collaterals could be performed as clinically indicated. 2. Successful ultrasound and fluoroscopic guided placement tunneled right internal jugular approach dialysis catheter placement with tips terminate within the superior aspect the right atrium. The dialysis catheter is ready for immediate use.  ACCESS: Given history of difficulty with fistula cannulation, surgical or percutaneous ligation of competing venous collaterals at the level of the mid and distal forearm could be performed to improve fistula maturation as clinically indicated.  This was discussed with referring nephrologist, Dr. Melvia Heaps, immediately following the diagnostic fistulagram.   Electronically Signed   By: Sandi Mariscal M.D.   On: 06/25/2015 16:11    Frazier Richards, MD 06/28/2015, 8:34 AM PGY-3, [place] Intern pager: [phone], text pages welcome

## 2015-06-28 NOTE — Progress Notes (Signed)
Pt a/o, no c/o pain, pt ind, VSS, pt stable

## 2015-06-28 NOTE — Progress Notes (Signed)
Assessment: 1. Vol excess/ pulm edema - improved prob at good weight now 2. AVF branch vessels 3. HTN on coreg/ hydralazine, improving 4. ESRD new start TTS 5. DM2 6. Anemia   Plan - For branch vessel ligations of AVF Monday.  From a renal perspective he can be discharged when you feel appropriate  Subjective: Interval History: Feels better with weight down ~16 Kg Objective: Vital signs in last 24 hours: Temp:  [97.1 F (36.2 C)-98 F (36.7 C)] 98 F (36.7 C) (07/17 0510) Pulse Rate:  [53-66] 66 (07/17 0510) Resp:  [18] 18 (07/17 0510) BP: (148-174)/(60-65) 173/63 mmHg (07/17 0510) SpO2:  [97 %-98 %] 98 % (07/17 0510) Weight:  [111.131 kg (245 lb)] 111.131 kg (245 lb) (07/17 0510) Weight change: 0.6 kg (1 lb 5.2 oz)  Intake/Output from previous day: 07/16 0701 - 07/17 0700 In: 780 [P.O.:780] Out: 3000  Intake/Output this shift: Total I/O In: 120 [P.O.:120] Out: 100 [Urine:100]  General appearance: alert and cooperative Chest wall: no tenderness Cardio: regular rate and rhythm, S1, S2 normal, no murmur, click, rub or gallop GI: soft, non-tender; bowel sounds normal; no masses,  no organomegaly Extremities: edema 1+ with wrapped legs and chronic changes  Lab Results:  Recent Labs  06/26/15 0736 06/27/15 0736  WBC 9.2 9.8  HGB 9.6* 9.5*  HCT 30.9* 30.3*  PLT 111* 118*   BMET:  Recent Labs  06/26/15 0736 06/27/15 0736  NA 139 136  K 4.1 3.5  CL 102 100*  CO2 28 28  GLUCOSE 95 164*  BUN 35* 33*  CREATININE 5.87* 5.80*  CALCIUM 8.2* 8.4*   No results for input(s): PTH in the last 72 hours. Iron Studies: No results for input(s): IRON, TIBC, TRANSFERRIN, FERRITIN in the last 72 hours. Studies/Results: No results found.  Scheduled: . aspirin EC  81 mg Oral q morning - 10a  . atorvastatin  40 mg Oral q1800  . calcitRIOL  0.25 mcg Oral Daily  . carvedilol  25 mg Oral BID WC  . colchicine  0.3 mg Oral Once per day on Mon Thu  . hydrALAZINE  25 mg Oral  BID  . insulin aspart  0-9 Units Subcutaneous TID WC  . predniSONE  20 mg Oral Q breakfast     LOS: 5 days   Anaia Frith C 06/28/2015,12:06 PM

## 2015-06-29 ENCOUNTER — Encounter: Payer: Self-pay | Admitting: Vascular Surgery

## 2015-06-29 ENCOUNTER — Encounter (HOSPITAL_COMMUNITY)
Admission: EM | Disposition: A | Discharge status: Home / Self Care | Payer: Medicare Other | Source: Home / Self Care | Attending: Family Medicine

## 2015-06-29 ENCOUNTER — Encounter (HOSPITAL_COMMUNITY): Payer: Self-pay | Admitting: Anesthesiology

## 2015-06-29 ENCOUNTER — Telehealth: Payer: Self-pay | Admitting: Vascular Surgery

## 2015-06-29 ENCOUNTER — Inpatient Hospital Stay (HOSPITAL_COMMUNITY): Payer: Medicare Other | Admitting: Anesthesiology

## 2015-06-29 DIAGNOSIS — T82898A Other specified complication of vascular prosthetic devices, implants and grafts, initial encounter: Secondary | ICD-10-CM | POA: Diagnosis not present

## 2015-06-29 HISTORY — PX: LIGATION OF COMPETING BRANCHES OF ARTERIOVENOUS FISTULA: SHX5949

## 2015-06-29 LAB — BASIC METABOLIC PANEL
ANION GAP: 12 (ref 5–15)
BUN: 47 mg/dL — AB (ref 6–20)
CO2: 23 mmol/L (ref 22–32)
Calcium: 8.7 mg/dL — ABNORMAL LOW (ref 8.9–10.3)
Chloride: 100 mmol/L — ABNORMAL LOW (ref 101–111)
Creatinine, Ser: 6.9 mg/dL — ABNORMAL HIGH (ref 0.61–1.24)
GFR calc Af Amer: 9 mL/min — ABNORMAL LOW (ref >60)
GFR calc non Af Amer: 7 mL/min — ABNORMAL LOW (ref >60)
Glucose, Bld: 148 mg/dL — ABNORMAL HIGH (ref 65–99)
Potassium: 5.2 mmol/L — ABNORMAL HIGH (ref 3.5–5.1)
Sodium: 135 mmol/L (ref 135–145)

## 2015-06-29 LAB — GLUCOSE, CAPILLARY
GLUCOSE-CAPILLARY: 113 mg/dL — AB (ref 65–99)
GLUCOSE-CAPILLARY: 123 mg/dL — AB (ref 65–99)
Glucose-Capillary: 197 mg/dL — ABNORMAL HIGH (ref 65–99)
Glucose-Capillary: 146 mg/dL — ABNORMAL HIGH (ref 65–99)
Glucose-Capillary: 179 mg/dL — ABNORMAL HIGH (ref 65–99)

## 2015-06-29 SURGERY — LIGATION OF COMPETING BRANCHES OF ARTERIOVENOUS FISTULA
Anesthesia: Monitor Anesthesia Care | Site: Arm Lower | Laterality: Left

## 2015-06-29 MED ORDER — FENTANYL CITRATE (PF) 250 MCG/5ML IJ SOLN
INTRAMUSCULAR | Status: AC
  Filled 2015-06-29: qty 5

## 2015-06-29 MED ORDER — 0.9 % SODIUM CHLORIDE (POUR BTL) OPTIME
TOPICAL | Status: DC | PRN
  Administered 2015-06-29: 1000 mL

## 2015-06-29 MED ORDER — SODIUM CHLORIDE 0.9 % IV SOLN
INTRAVENOUS | Status: DC | PRN
  Administered 2015-06-29: 07:00:00 via INTRAVENOUS

## 2015-06-29 MED ORDER — LABETALOL HCL 5 MG/ML IV SOLN
INTRAVENOUS | Status: DC | PRN
  Administered 2015-06-29 (×4): 5 mg via INTRAVENOUS

## 2015-06-29 MED ORDER — OXYCODONE HCL 5 MG/5ML PO SOLN: 5.0000 mg | Freq: Once | ORAL | Status: DC | PRN

## 2015-06-29 MED ORDER — OXYCODONE HCL 5 MG PO TABS
5.0000 mg | ORAL_TABLET | Freq: Once | ORAL | Status: DC | PRN
Start: 2015-06-29 — End: 2015-06-29

## 2015-06-29 MED ORDER — ACETAMINOPHEN 325 MG PO TABS: 325.0000 mg | ORAL_TABLET | ORAL | Status: DC | PRN

## 2015-06-29 MED ORDER — CEFAZOLIN SODIUM-DEXTROSE 2-3 GM-% IV SOLR
INTRAVENOUS | Status: DC | PRN
  Administered 2015-06-29: 2 g via INTRAVENOUS

## 2015-06-29 MED ORDER — FENTANYL CITRATE (PF) 100 MCG/2ML IJ SOLN
INTRAMUSCULAR | Status: DC | PRN
  Administered 2015-06-29: 50 ug via INTRAVENOUS
  Administered 2015-06-29 (×2): 100 ug via INTRAVENOUS

## 2015-06-29 MED ORDER — LIDOCAINE-EPINEPHRINE (PF) 1 %-1:200000 IJ SOLN
INTRAMUSCULAR | Status: AC
  Filled 2015-06-29: qty 10

## 2015-06-29 MED ORDER — OXYCODONE-ACETAMINOPHEN 5-325 MG PO TABS: 1.0000 | ORAL_TABLET | Freq: Four times a day (QID) | ORAL | Status: DC | PRN

## 2015-06-29 MED ORDER — ACETAMINOPHEN 160 MG/5ML PO SOLN: 325.0000 mg | ORAL | Status: DC | PRN

## 2015-06-29 MED ORDER — THROMBIN 20000 UNITS EX KIT
PACK | CUTANEOUS | Status: DC | PRN
  Administered 2015-06-29: 20 mL via TOPICAL

## 2015-06-29 MED ORDER — MIDAZOLAM HCL 5 MG/5ML IJ SOLN
INTRAMUSCULAR | Status: DC | PRN
  Administered 2015-06-29: 2 mg via INTRAVENOUS

## 2015-06-29 MED ORDER — LABETALOL HCL 5 MG/ML IV SOLN
INTRAVENOUS | Status: AC
  Filled 2015-06-29: qty 4

## 2015-06-29 MED ORDER — FENTANYL CITRATE (PF) 100 MCG/2ML IJ SOLN: INTRAMUSCULAR | Status: DC | PRN

## 2015-06-29 MED ORDER — BUPIVACAINE HCL 0.5 % IJ SOLN
INTRAMUSCULAR | Status: DC | PRN
  Administered 2015-06-29: 7 mL

## 2015-06-29 MED ORDER — SODIUM CHLORIDE 0.9 % IR SOLN
Status: DC | PRN
  Administered 2015-06-29: 500 mL

## 2015-06-29 MED ORDER — CEFAZOLIN SODIUM-DEXTROSE 2-3 GM-% IV SOLR
INTRAVENOUS | Status: AC
  Filled 2015-06-29: qty 50

## 2015-06-29 MED ORDER — FENTANYL CITRATE (PF) 100 MCG/2ML IJ SOLN: 25.0000 ug | INTRAMUSCULAR | Status: DC | PRN

## 2015-06-29 MED ORDER — PROPOFOL 10 MG/ML IV BOLUS
INTRAVENOUS | Status: AC
  Filled 2015-06-29: qty 20

## 2015-06-29 MED ORDER — ONDANSETRON HCL 4 MG/2ML IJ SOLN
INTRAMUSCULAR | Status: DC | PRN
  Administered 2015-06-29: 4 mg via INTRAVENOUS

## 2015-06-29 MED ORDER — THROMBIN 20000 UNITS EX SOLR
CUTANEOUS | Status: AC
  Filled 2015-06-29: qty 20000

## 2015-06-29 MED ORDER — LIDOCAINE-EPINEPHRINE (PF) 1 %-1:200000 IJ SOLN
INTRAMUSCULAR | Status: DC | PRN
  Administered 2015-06-29: 7 mL via INTRADERMAL

## 2015-06-29 MED ORDER — MIDAZOLAM HCL 5 MG/5ML IJ SOLN: INTRAMUSCULAR | Status: DC | PRN

## 2015-06-29 MED ORDER — PROPOFOL 10 MG/ML IV BOLUS
INTRAVENOUS | Status: DC | PRN
  Administered 2015-06-29: 30 mg via INTRAVENOUS
  Administered 2015-06-29: 20 mg via INTRAVENOUS
  Administered 2015-06-29: 30 mg via INTRAVENOUS

## 2015-06-29 MED ORDER — PROMETHAZINE HCL 25 MG/ML IJ SOLN: 6.2500 mg | INTRAMUSCULAR | Status: DC | PRN

## 2015-06-29 MED ORDER — MIDAZOLAM HCL 2 MG/2ML IJ SOLN
INTRAMUSCULAR | Status: AC
  Filled 2015-06-29: qty 2

## 2015-06-29 SURGICAL SUPPLY — 41 items
BANDAGE ESMARK 6X9 LF (GAUZE/BANDAGES/DRESSINGS) IMPLANT
BNDG CMPR 9X6 STRL LF SNTH (GAUZE/BANDAGES/DRESSINGS) ×1
BNDG ESMARK 6X9 LF (GAUZE/BANDAGES/DRESSINGS) ×3
CANISTER SUCTION 2500CC (MISCELLANEOUS) ×3 IMPLANT
CLIP TI MEDIUM 24 (CLIP) ×3 IMPLANT
CLIP TI WIDE RED SMALL 24 (CLIP) ×3 IMPLANT
COVER PROBE W GEL 5X96 (DRAPES) ×3 IMPLANT
COVER SURGICAL LIGHT HANDLE (MISCELLANEOUS) ×2 IMPLANT
CUFF TOURNIQUET SINGLE 34IN LL (TOURNIQUET CUFF) ×2 IMPLANT
ELECT REM PT RETURN 9FT ADLT (ELECTROSURGICAL) ×3
ELECTRODE REM PT RTRN 9FT ADLT (ELECTROSURGICAL) ×1 IMPLANT
GAUZE SPONGE 4X4 12PLY STRL (GAUZE/BANDAGES/DRESSINGS) ×3 IMPLANT
GEL ULTRASOUND 20GR AQUASONIC (MISCELLANEOUS) IMPLANT
GLOVE BIO SURGEON STRL SZ7 (GLOVE) ×3 IMPLANT
GLOVE BIOGEL PI IND STRL 6.5 (GLOVE) IMPLANT
GLOVE BIOGEL PI IND STRL 7.5 (GLOVE) ×1 IMPLANT
GLOVE BIOGEL PI INDICATOR 6.5 (GLOVE) ×4
GLOVE BIOGEL PI INDICATOR 7.5 (GLOVE) ×2
GLOVE SURG SS PI 7.0 STRL IVOR (GLOVE) ×2 IMPLANT
GOWN STRL REUS W/ TWL LRG LVL3 (GOWN DISPOSABLE) ×3 IMPLANT
GOWN STRL REUS W/ TWL XL LVL3 (GOWN DISPOSABLE) IMPLANT
GOWN STRL REUS W/TWL LRG LVL3 (GOWN DISPOSABLE) ×3
GOWN STRL REUS W/TWL XL LVL3 (GOWN DISPOSABLE) ×3
KIT BASIN OR (CUSTOM PROCEDURE TRAY) ×3 IMPLANT
KIT ROOM TURNOVER OR (KITS) ×3 IMPLANT
LIQUID BAND (GAUZE/BANDAGES/DRESSINGS) ×3 IMPLANT
NS IRRIG 1000ML POUR BTL (IV SOLUTION) ×3 IMPLANT
PACK CV ACCESS (CUSTOM PROCEDURE TRAY) ×3 IMPLANT
PAD ARMBOARD 7.5X6 YLW CONV (MISCELLANEOUS) ×6 IMPLANT
SPONGE SURGIFOAM ABS GEL 100 (HEMOSTASIS) IMPLANT
SUT ETHILON 3 0 PS 1 (SUTURE) IMPLANT
SUT MNCRL AB 4-0 PS2 18 (SUTURE) ×4 IMPLANT
SUT PROLENE 6 0 BV (SUTURE) ×4 IMPLANT
SUT PROLENE 7 0 BV 1 (SUTURE) ×6 IMPLANT
SUT SILK 0 TIES 10X30 (SUTURE) ×1 IMPLANT
SUT SILK 4 0 (SUTURE) ×3
SUT SILK 4-0 18XBRD TIE 12 (SUTURE) IMPLANT
SUT VIC AB 3-0 SH 27 (SUTURE) ×3
SUT VIC AB 3-0 SH 27X BRD (SUTURE) ×1 IMPLANT
UNDERPAD 30X30 INCONTINENT (UNDERPADS AND DIAPERS) ×3 IMPLANT
WATER STERILE IRR 1000ML POUR (IV SOLUTION) ×1 IMPLANT

## 2015-06-29 NOTE — Anesthesia Preprocedure Evaluation (Addendum)
Anesthesia Evaluation  Patient identified by MRN, date of birth, ID band Patient awake    Reviewed: Allergy & Precautions, NPO status , Patient's Chart, lab work & pertinent test results, reviewed documented beta blocker date and time   History of Anesthesia Complications Negative for: history of anesthetic complications  Airway Mallampati: II  TM Distance: >3 FB Neck ROM: Full    Dental no notable dental hx. (+) Loose, Missing,    Pulmonary former smoker,  breath sounds clear to auscultation  Pulmonary exam normal       Cardiovascular hypertension, Pt. on medications and Pt. on home beta blockers - angina+ Peripheral Vascular Disease and +CHF - Past MI Normal cardiovascular exam- dysrhythmias Rhythm:Regular Rate:Normal     Neuro/Psych negative neurological ROS  negative psych ROS   GI/Hepatic negative GI ROS, Neg liver ROS,   Endo/Other  diabetes, Type 2, Oral Hypoglycemic Agents  Renal/GU ESRF and DialysisRenal disease  negative genitourinary   Musculoskeletal negative musculoskeletal ROS (+)   Abdominal   Peds negative pediatric ROS (+)  Hematology negative hematology ROS (+)   Anesthesia Other Findings   Reproductive/Obstetrics negative OB ROS                            Anesthesia Physical Anesthesia Plan  ASA: III  Anesthesia Plan: MAC   Post-op Pain Management:    Induction: Intravenous  Airway Management Planned: Simple Face Mask  Additional Equipment: None  Intra-op Plan:   Post-operative Plan:   Informed Consent: I have reviewed the patients History and Physical, chart, labs and discussed the procedure including the risks, benefits and alternatives for the proposed anesthesia with the patient or authorized representative who has indicated his/her understanding and acceptance.   Dental advisory given  Plan Discussed with: CRNA and Surgeon  Anesthesia Plan  Comments:        Anesthesia Quick Evaluation

## 2015-06-29 NOTE — Telephone Encounter (Signed)
Unable to reach pt by phone, mailed letter, dpm

## 2015-06-29 NOTE — Addendum Note (Signed)
Addendum  created 06/29/15 1033 by Fidela Juneau, CRNA   Modules edited: Anesthesia Flowsheet

## 2015-06-29 NOTE — Op Note (Addendum)
OPERATIVE NOTE   PROCEDURE: 1. Ligation of side-branches of left radiocephalic arteriovenous fistula x 3  PRE-OPERATIVE DIAGNOSIS: Difficulty cannulating left radiocephalic arteriovenous fistula    POST-OPERATIVE DIAGNOSIS: same as above   SURGEON: Adele Barthel, MD  ANESTHESIA: local and MAC  ESTIMATED BLOOD LOSS: 100 cc  FINDING(S): 1.  Large hematoma in mid-arm due to cannulation 2.  No healed cannulation site in distal arm 3.  Fistula 6 mm throughout most of route 4.  Improved thrill after ligation of side-branches found on fistulogram  SPECIMEN(S):  none  INDICATIONS:   [name] is a 64 y.o. male who presents with infilitrated left radiocephalic arteriovenous fistula after failed cannulation.  Fistulogram demonstrated likely mature fistula with three sets of large side branches.  I recommended ligation of these side-branches to try to optimize this fistula and possibly lead to further dilation.  Risk, benefits, and alternatives to access surgery were discussed.  The patient is aware the risks include but are not limited to: bleeding, infection, steal syndrome, nerve damage, ischemic monomelic neuropathy, failure to mature, need for additional procedures, death and stroke.  The patient agrees to proceed forward with the procedure.   DESCRIPTION: After obtaining full informed written consent, the patient was brought back to the operating room and placed supine upon the operating table.  The patient received IV antibiotics prior to induction.  After obtaining adequate anesthesia, the patient was prepped and draped in the standard fashion for: left arm access procedure.  I imaged the left radiocephalic arteriovenous fistula with a sonosite and marked the three side branches found on prior fistulogram.  I injected 1% lidocaine with epinephrine at all each side-branch site, using 15 mL in total.   I first made and incision over the middle side-branch.  I found a small side  branch here and tied off with 4-0 silk ties and transected it.  This side-branch did not appeared to be as big as the fistulogram so I dissected out the fistula completely and extended the incision proximally and distally.  I did not find another side-branch.  I suspectet the epinephrine may have caused the side-branch to contract.  I moved on to the distal side-branch which appeared to be multibranched.  I made an incision and dissected out the fistula.  In this process, I encountered a prior cannulation site.  I could not get good visualization due to bleeding so I had to place a sterile tourniquet and inflate it to 250 mm Hg for 2 minutes.  I repaired the cannulation hole with 6-0 and 7-0 prolene stitches.  The tourniquet was deflated with continued blood flow in this fistula as evident by a good thrill.  I easily identified a side branch with multiple branches off it.  I tied off all the branches with 4-0 silk then I dissected down to the segment originating from the fistula.  This was tied off also with 3-0 silk.  I packed this incision and the middle incision with thrombin and gelfoam.  Finally, I made an incision proximally in the forearm and dissected out the large side-branch here.  There was not enough space here to formal ligate and transect the branch as it appeared to be nearly a side-to-side adjacent vein.  I elected to tie a 2-0 silk at the very short connecting vein.  At the end of this procedure, there was a strong thrill in this fistula.  The fistula appeared to be 6 mm in all exposed segments.  I was washed out all incisions and repacked them with thrombin and gelfoam.  After a few minutes, I repaired each incision with a layer of 3-0 Vicryl in the subcutaneous tissue and a 4-0 Monocryl in the skin.  The incisions were cleaned, dried, and reinforced with Dermabond.    COMPLICATIONS: none  CONDITION: stable   Adele Barthel, MD [facility] [place] Office:  [phone] Pager: [phone]  06/29/2015, 9:21 AM

## 2015-06-29 NOTE — Anesthesia Postprocedure Evaluation (Signed)
  Anesthesia Post-op Note  Patient: [name]  Procedure(s) Performed: Procedure(s): LIGATION OF LEFT ARM RADIOCEPHALIC ARTERIOVENOUS FISTULA SIDE BRANCHES (Left)  Patient Location: PACU  Anesthesia Type:MAC  Level of Consciousness: awake  Airway and Oxygen Therapy: Patient Spontanous Breathing  Post-op Pain: mild  Post-op Assessment: Post-op Vital signs reviewed, Patient's Cardiovascular Status Stable, Respiratory Function Stable, Patent Airway, No signs of Nausea or vomiting and Pain level controlled              Post-op Vital Signs: Reviewed and stable  Last Vitals:  Filed Vitals:   06/29/15 0946  BP: 187/73  Pulse: 67  Temp:   Resp: 18    Complications: No apparent anesthesia complications

## 2015-06-29 NOTE — Addendum Note (Signed)
Addendum  created 06/29/15 1054 by Fidela Juneau, CRNA   Modules edited: Anesthesia Flowsheet

## 2015-06-29 NOTE — H&P (View-Only) (Signed)
Hospital Consult    Reason for Consult:  Difficulty cannulating L [facility] AVF Referring Physician:  Melvia Heaps MRN #:  [id number]  History of Present Illness: This is a 64 y.o. male who presented to the hospital after having shortness of breath and anxiety after having his first HD treatment.    He was brought to the emergency room and found to have volume overload including pulmonary edema on CXR.  He underwent a left radiocephalic AVF on [id number] by Dr. Donnetta Hutching.  The pt was seen back on 04/29/14 at which time, he was found to have a good size fistula.  On physical exam, there were several tributary branches, but did not appear to be limiting flow through the cephalic vein.    The fistula was used once without difficulty.  Since then, it has been difficult to stick and had poor flow rates.  He is undergoing a fistulogram and placement of diatek catheter by IR today.  VVS is asked to evaluate his fistula for the large competing branches.  The patient has not had any steal sx.  The pt is on a beta blocker and CCB for HTN.  He is on a statin for his cholesterol management.  Past Medical History  Diagnosis Date  . Diabetes mellitus   . Hypertension   . Hyperlipidemia   . Chronic kidney disease   . PVD (peripheral vascular disease)   . Venous insufficiency   . Morbid obesity   . Chronic cystitis   . Arthritis   . UMBILICAL HERNIA [id number]    Qualifier: History of  By: [name] MD, Audelia Acton    . NEPHROLITHIASIS, HX OF 12/02/2009    Qualifier: Diagnosis of  By: [name], Cat    . Lymphedema   . CHF (congestive heart failure)   . Asthma     as a child  . H/O hiatal hernia     states it's been fixed  . Dry skin     Past Surgical History  Procedure Laterality Date  . R knee arthoscopic repair of meniscus    . Umbilical hernia repair    . Amputation      Right and left fifth toes.   . Popliteal to posterior tibial bypass      2006  . Eye surgery Bilateral     cataract and lens implant  .  Colonoscopy    . Hernia repair    . Av fistula placement Left 03/21/2014    Procedure: ARTERIOVENOUS (AV) FISTULA CREATION with ultrasound;  Surgeon: [name], MD;  Location: [facility];  Service: Vascular;  Laterality: Left;    Allergies  Allergen Reactions  . Shrimp [Shellfish Allergy] Swelling  . Iodine Swelling    Prior to Admission medications   Medication Sig Start Date End Date Taking? Authorizing Provider  amLODipine (NORVASC) 10 MG tablet Take 10 mg by mouth every morning.   Yes Historical Provider, MD  aspirin EC 81 MG tablet Take 81 mg by mouth every morning.   Yes Historical Provider, MD  atorvastatin (LIPITOR) 40 MG tablet Take 1 tablet (40 mg total) by mouth daily at 6 PM. 03/11/14  Yes Frazier Richards, MD  carvedilol (COREG) 25 MG tablet Take 1 tablet (25 mg total) by mouth 2 (two) times daily with a meal. 03/11/14  Yes Frazier Richards, MD  colchicine 0.6 MG tablet Take 0.5 tablets (0.3 mg total) by mouth daily. Patient taking differently: Take 0.6 mg by mouth daily as needed (gout flare  ups).  04/06/15  Yes Barton Dubois, MD  darbepoetin (ARANESP) 100 MCG/0.5ML SOLN injection Inject 100 mcg into the skin every 14 (fourteen) days. [facility].   Yes Historical Provider, MD  hydrALAZINE (APRESOLINE) 100 MG tablet Take 1 tablet (100 mg total) by mouth 2 (two) times daily. take 1 tablet by mouth twice a day 01/20/14  Yes Waldemar Dickens, MD  calcitRIOL (ROCALTROL) 0.25 MCG capsule Take 0.25 mcg by mouth daily.  04/24/14   Historical Provider, MD  furosemide (LASIX) 80 MG tablet Take 80 mg by mouth 2 (two) times daily.    Historical Provider, MD  sodium bicarbonate 650 MG tablet Take 650 mg by mouth 3 (three) times daily.    Historical Provider, MD    History   Social History  . Marital Status: Legally Separated    Spouse Name: N/A  . Number of Children: 1  . Years of Education: N/A   Occupational History  .     Social History Main Topics  . Smoking status: Former Smoker     Quit date: 06/06/1979  . Smokeless tobacco: Former Systems developer  . Alcohol Use: No     Comment:   "years ago" , none now  . Drug Use: No  . Sexual Activity: No   Other Topics Concern  . Not on file   Social History Narrative   Lives alone.  Only son is deceased.      Family History  Problem Relation Age of Onset  . Diabetes Mother   . Heart disease Mother   . Diabetes Brother     ROS: [x]  Positive   [ ]  Negative   [ ]  All sytems reviewed and are negative  Cardiovascular: []  chest pain/pressure []  palpitations [x]  SOB lying flat [x]  DOE []  pain in legs while walking []  pain in legs at rest []  pain in legs at night []  non-healing ulcers []  hx of DVT [x]  swelling in legs  Pulmonary: []  productive cough []  asthma/wheezing []  home O2  Neurologic: []  weakness in []  arms []  legs []  numbness in []  arms []  legs []  hx of CVA []  mini stroke [] difficulty speaking or slurred speech []  temporary loss of vision in one eye []  dizziness  Hematologic: []  hx of cancer []  bleeding problems []  problems with blood clotting easily  Endocrine:   []  diabetes []  thyroid disease  GI []  vomiting blood []  blood in stool  GU: []  CKD/renal failure []  HD--[]  M/W/F or []  T/T/S []  burning with urination []  blood in urine  Psychiatric: []  anxiety []  depression  Musculoskeletal: []  arthritis []  joint pain  Integumentary: []  rashes [x]  ulcers  Constitutional: []  fever []  chills   Physical Examination  Filed Vitals:   06/25/15 1417  BP: 178/76  Pulse: 66  Temp:   Resp: 15   Body mass index is 36.46 kg/(m^2).  General:  WDWN in NAD Gait: Not observed HENT: WNL, normocephalic Pulmonary: normal non-labored breathing, without Rales, rhonchi,  wheezing Cardiac: regular, without  Murmurs, rubs or gallops; without carotid bruits Abdomen: soft, NT/ND, no masses Skin: both legs in unna boots, so can't evaluate feet Vascular Exam/Pulses:  Right Left  Radial 2+ (normal) 2+  (normal)  Ulnar 1+ (weak) 1+ (weak)  Brachial 2+ (normal) 2+ (normal)   Extremities: both legs in unna boots, L arm swollen with some echymosis from infilitration, palpable thrill, noticeably decreased in mid-segment and proximal Musculoskeletal: no muscle wasting or atrophy  Neurologic: A&O X 3; Appropriate Affect ;  SENSATION: normal; MOTOR FUNCTION:  moving all extremities equally. Speech is fluent/normal Psychiatric: Judgment intact, Mood & affect appropriate for pt's clinical situation Lymph : No Cervical, Axillary, or Inguinal lymphadenopathy    CBC    Component Value Date/Time   WBC 9.8 06/24/2015 0320   WBC 8.3 07/03/2014 0812   RBC 3.26* 06/24/2015 0320   RBC 3.37* 07/03/2014 0812   HGB 9.4* 06/24/2015 0320   HGB 9.4* 07/03/2014 0812   HCT 29.6* 06/24/2015 0320   HCT 29.0* 07/03/2014 0812   PLT 131* 06/24/2015 0320   PLT 136* 07/03/2014 0812   MCV 90.8 06/24/2015 0320   MCV 86.1 07/03/2014 0812   MCH 28.8 06/24/2015 0320   MCH 27.8 07/03/2014 0812   MCHC 31.8 06/24/2015 0320   MCHC 32.3 07/03/2014 0812   RDW 16.7* 06/24/2015 0320   RDW 16.3* 07/03/2014 0812   LYMPHSABS 1.5 04/04/2015 1216   LYMPHSABS 1.5 07/03/2014 0812   MONOABS 1.1* 04/04/2015 1216   MONOABS 0.7 07/03/2014 0812   EOSABS 0.6 04/04/2015 1216   EOSABS 0.6* 07/03/2014 0812   BASOSABS 0.0 04/04/2015 1216   BASOSABS 0.1 07/03/2014 0812    BMET    Component Value Date/Time   NA 142 06/25/2015 1215   NA 142 07/03/2014 0814   K 4.2 06/25/2015 1215   K 4.4 07/03/2014 0814   CL 106 06/25/2015 1215   CO2 26 06/25/2015 1215   CO2 20* 07/03/2014 0814   GLUCOSE 86 06/25/2015 1215   GLUCOSE 136 07/03/2014 0814   BUN 53* 06/25/2015 1215   BUN 93.6* 07/03/2014 0814   CREATININE 7.33* 06/25/2015 1215   CREATININE 7.3* 07/03/2014 0814   CREATININE 5.95* 03/04/2014 1113   CALCIUM 8.7* 06/25/2015 1215   CALCIUM 8.6 07/03/2014 0814   CALCIUM 7.5* 03/07/2014 1909   GFRNONAA 7* 06/25/2015 1215    GFRNONAA 31* 08/16/2012 1517   GFRAA 8* 06/25/2015 1215   GFRAA 36* 08/16/2012 1517    COAGS: Lab Results  Component Value Date   INR 1.18 06/25/2015   INR 1.01 09/21/2011   INR 1.01 06/24/2011   Radiology: Dg Chest Port 1 View  06/23/2015   CLINICAL DATA:  64 year old diabetic hypertensive male with acute shortness of breath that started during dialysis. Subsequent encounter.  EXAM: PORTABLE CHEST - 1 VIEW  COMPARISON:  04/04/2015.  FINDINGS: Cardiomegaly with pulmonary edema. Small pleural effusion suspected.  No gross pneumothorax.  Calcified slightly tortuous aorta.  IMPRESSION: Cardiomegaly with pulmonary edema. Small pleural effusion suspected.  Calcified slightly tortuous aorta.   Electronically Signed   By: Genia Del M.D.   On: 06/23/2015 16:13    ASSESSMENT/PLAN: This is a 64 y.o. male with ESRD requiring HD with L RC AVF with difficulty cannulating  - cont HD vs TDC - Will likely need further intervention to optimize L RC AVF  Leontine Locket, PA-C [facility] [phone]  Addendum  I have independently interviewed and examined the patient, and I agree with the physician assistant's findings.    L arm fistulogram (06/25/2015): final read not complete.   Based on my read of the fistulogram, the L RC AVF appears mature or near mature.  There are 3 large side-branches siphoning flow off the radiocephalic arteriovenous fistula which drains into the brachial/basilic venous system.  The side-branches likely account for the findings on physical exam.  - Further dialysis would help with fluid status, which would help with surgical exposure - Will plan on Side-branch ligation of L RC  AVF on Monday to force the blood flow into the main channel of the fistula, leading to possibly further dilation of the fistula. - Would delay cannulation for another 2 weeks after side-branch ligation to allow further fluid status optimization and healing of side-branch exposure  incisions.  Adele Barthel, MD [facility] [place] Office: [phone] Pager: [phone]  06/25/2015, 3:04 PM

## 2015-06-29 NOTE — Progress Notes (Signed)
Admit: 06/23/2015 LOS: 6  64M new ESRD last week with vol overload and challenging cannulation of AVF  Subjective:  S/p ligation of side branches today No c/o Down 20lb from admission Has [facility]    07/17 0701 - 07/18 0700 In: 780 [P.O.:780] Out: 850 [Urine:850]  Filed Weights   06/27/15 1023 06/28/15 0510 06/29/15 0517  Weight: 111 kg (244 lb 11.4 oz) 111.131 kg (245 lb) 111.857 kg (246 lb 9.6 oz)    Scheduled Meds: . aspirin EC  81 mg Oral q morning - 10a  . atorvastatin  40 mg Oral q1800  . calcitRIOL  0.25 mcg Oral Daily  . carvedilol  25 mg Oral BID WC  . colchicine  0.3 mg Oral Once per day on Mon Thu  . hydrALAZINE  25 mg Oral BID  . insulin aspart  0-9 Units Subcutaneous TID WC  . predniSONE  20 mg Oral Q breakfast   Continuous Infusions:  PRN Meds:.sodium chloride, sodium chloride, acetaminophen **OR** acetaminophen, feeding supplement (NEPRO CARB STEADY), ondansetron, oxyCODONE-acetaminophen, polyethylene glycol  Current Labs: reviewed    Physical Exam:  Blood pressure 154/68, pulse 55, temperature 98.4 F (36.9 C), temperature source Oral, resp. rate 16, height 5\' 11"  (1.803 m), weight 111.857 kg (246 lb 9.6 oz), SpO2 100 %. NAD Legs wrapped in ACE bandages TDC +B/T of LFA AVF Nonfocal  A/P 1. ESRD 1. Started [place] last week 2. On [facility] Schedule 3. Using [facility] for now, VVS to guide on when can access AVF again 4. No heparin 7/19 5. OK with DC post HD tomorrow 2. HTN/Vol 1. Was profoundly overloaded at admission 2. Tolerating UF 3. Challenge again on Tuesday, target 110kg 3. HTN: cont meds, challenge EDW 4. DM2 5. Anemia  Pearson Grippe MD 06/29/2015, 2:46 PM   Recent Labs Lab 06/24/15 0320  06/26/15 0736 06/27/15 0736 06/29/15 0301  NA 140  < > 139 136 135  K 3.8  < > 4.1 3.5 5.2*  CL 103  < > 102 100* 100*  CO2 29  < > 28 28 23   GLUCOSE 127*  < > 95 164* 148*  BUN 48*  < > 35* 33* 47*  CREATININE 6.73*  < > 5.87* 5.80* 6.90*  CALCIUM 8.1*   < > 8.2* 8.4* 8.7*  PHOS 4.7*  --  4.6 4.7*  --   < > = values in this interval not displayed.  Recent Labs Lab 06/24/15 0320 06/26/15 0736 06/27/15 0736  WBC 9.8 9.2 9.8  HGB 9.4* 9.6* 9.5*  HCT 29.6* 30.9* 30.3*  MCV 90.8 90.6 90.2  PLT 131* 111* 118*

## 2015-06-29 NOTE — Telephone Encounter (Signed)
-----   Message from Mena Goes, RN sent at 06/29/2015  9:40 AM EDT ----- Regarding: Schedule   ----- Message -----    From: Conrad Klukwan, MD    Sent: 06/29/2015   9:33 AM      To: Vvs Charge [address]  [name] [id number] Aug 28, 1951    PROCEDURE: Ligation of side-branches of left brachiocephalic arteriovenous fistula x 3  Follow-up: 4 weeks

## 2015-06-29 NOTE — Progress Notes (Signed)
Family Medicine Teaching Service Daily Progress Note Intern Pager: [phone]  Patient name: [name] Medical record number: [id number] Date of birth: Mar 01, 1951 Age: 64 y.o. Gender: male  Primary Care Provider: Beverlyn Roux, MD Consultants: nephrology Code Status: full  Pt Overview and Major Events to Date:  7/12: Admitted to floor 7/13: HD ~5 L removed  7/14: HD- A-fib and PVCs  7/15: HD 7/16: HD 7/18: AVF revision per vascular  Assessment and Plan: ORLANDO DEVEREUX is a 64 y.o. male presenting with volume overload and SOB following first HD session 7/12. PMH is significant for HTN, CHF, DM2, ESRD and MGUS  ESRD with volume overload: Improving symptoms with daily HD. Likely component of anxiety and discomfort. Will continue TTS HD at [facility] at discharge. Access: tunneled HD cath, resting AVF; management per vascular surgery.  - Per nephrology, stable and ready for discharge following vascular procedure - Holding lasix, lowered hydralazine while taking volume at HD - AVF revision on 7/18 per vascular  Knee pain: likely gout flair, warm, red and tender, h/o gout in fingers, no infectious symptoms - prednisone 47m daily  Chronic diastolic CHF: Repeat echo here unchanged: LVH, EF 55%, G1DD.  - Cautious volume removal; continue home medications  DM: Hb A1c 6.6% June 2016 - hold home glipizide - sensitive SSI  Chronic venous insufficiency: gets weekly unna boot changes at wound care - continue [place] boots[place] placed 7/13.  - WOC consult  HTN; Patient has been hypertensive due to volume excess. - per nephrology at this time  FEN/GI: renal/carb mod diet, saline lock IV Prophylaxis: SQ heparin  Disposition: home when euvolemic and cleared by renal  Subjective:  Patient reports vastly improved dyspnea and swelling. No complaints this morning. AVF revision went well.   Objective: Temp:  [97.6 F (36.4 C)-97.9 F  (36.6 C)] 97.8 F (36.6 C) (07/18 0517) Pulse Rate:  [56-65] 65 (07/18 0517) Resp:  [18] 18 (07/18 0517) BP: (140-169)/(56-76) 169/58 mmHg (07/18 0517) SpO2:  [100 %] 100 % (07/18 0517) Weight:  [246 lb 9.6 oz (111.857 kg)] 246 lb 9.6 oz (111.857 kg) (07/18 0517) Physical Exam:  Gen: Alert 64 y.o.male lying in bed watching TV HEENT: MMM, posterior oropharynx clear Pulm: Non-labored; scant bibasilar crackles, no wheezes  CV: Regular rate, no murmur appreciated; L forearm AVF w/+thrill  GI: + BS; soft, non-tender, non-distended Skin: Bilateral unna boots. Tunneled HD cath c/d/i on R chest wall.  Neuro: A&Ox3, speech normal  Laboratory:  Recent Labs Lab 06/24/15 0320 06/26/15 0736 06/27/15 0736  WBC 9.8 9.2 9.8  HGB 9.4* 9.6* 9.5*  HCT 29.6* 30.9* 30.3*  PLT 131* 111* 118*    Recent Labs Lab 06/26/15 0736 06/27/15 0736 06/29/15 0301  NA 139 136 135  K 4.1 3.5 5.2*  CL 102 100* 100*  CO2 '28 28 23  ' BUN 35* 33* 47*  CREATININE 5.87* 5.80* 6.90*  CALCIUM 8.2* 8.4* 8.7*  GLUCOSE 95 164* 148*    Imaging/Diagnostic Tests: Ir Fluoro Guide Cv Line Right  06/25/2015   INDICATION: History of chronic renal insufficiency, now progressed to end-stage renal disease.  Patient had left forearm native AV fistula created approximately 1 year ago in anticipation of impending dialysis access need. The patient was able to received limited dialysis via this left upper extremity fistula earlier this week [facility] though was admitted with congestive heart failure where inpatient attempted cannulation of the fistula ultimately  proved unsuccessful.  As such, request made for a diagnostic left upper extremity fistulagram as well as potential hemodialysis catheter placement.  EXAM: 1. DIALYSIS AV SHUNTOGRAM/FISTULAGRAM 2. ULTRASOUND FLUOROSCOPIC GUIDED TUNNELED HEMODIALYSIS CATHETER PLACEMENT  COMPARISON:  None.  MEDICATIONS: Versed 1 mg IV; Fentanyl 50 mcg IV  CONTRAST:  40 cc  Omnipaque 300  ANESTHESIA/SEDATION: Sedation time  10 minutes  FLUOROSCOPY TIME:  1 minute 24 seconds (24.2 mGy)  COMPLICATIONS: None immediate  PROCEDURE: The skin overlying the left forearm arm dialysis graft was prepped with Betadine in a sterile fashion, and a sterile drape was applied covering the operative field. A diagnostic shunt study was performed via an 18 gauge angiocatheter introduced into venous outflow. Venous drainage was assessed to the level of the central veins in the chest. Proximal shunt was studied by reflux maneuver with temporary compression of venous outflow.  Findings from the diagnostic fistulagram were discussed with referring nephrologist, Dr. Melvia Heaps, and the decision was made to proceed with tunneled dialysis catheter placement.  The angiocath was removed and hemostasis was achieved with manual compression. A dressing was placed. The patient tolerated the procedure well without immediate post procedural complication.  The right neck and chest were prepped with chlorhexidine in a sterile fashion, and a sterile drape was applied covering the operative field. Maximum barrier sterile technique with sterile gowns and gloves were used for the procedure. A timeout was performed prior to the initiation of the procedure.  After creating a small venotomy incision, a micropuncture kit was utilized to access the right internal jugular vein under direct, real-time ultrasound guidance after the overlying soft tissues were anesthetized with 1% lidocaine with epinephrine. Ultrasound image documentation was performed. The microwire was kinked to measure appropriate catheter length. A stiff Glidewire was advanced to the level of the IVC and the micropuncture sheath was exchanged for a peel-away sheath. A hemosplit tunneled hemodialysis catheter measuring 23 cm from tip to cuff was tunneled in a retrograde fashion from the anterior chest wall to the venotomy incision.  The catheter was then placed through  the peel-away sheath with tips ultimately positioned within the superior aspect of the right atrium. Final catheter positioning was confirmed and documented with a spot radiographic image. The catheter aspirates and flushes normally. The catheter was flushed with appropriate volume heparin dwells.  The catheter exit site was secured with a 0-Prolene retention suture. The venotomy incision was closed with an interrupted 4-0 Vicryl, Dermabond and Steri-strips. Dressings were applied. The patient tolerated the procedure well without immediate post procedural complication.  FINDINGS: The patient's left forearm native radial-cephalic AV fistula is widely patent. There is hypertrophy of several competing veins noted at the level of the mid forearm.  The fistula drains to the deep venous system of the left upper extremity at the level of the antecubital fossa.  The central venous system of the left upper extremity is widely patent to the superior aspect of the right atrium.  Reflux fistulagram demonstrates hypertrophy of several competing venous collaterals at the level of the distal forearm.  The arterial limb and anastomosis are widely patent.  After tunneled hemodialysis catheter placement, the tips of the dialysis catheter terminate within the superior aspect of the right atrium.  IMPRESSION: 1. The left forearm native AV fistula is widely patent though there are multiple hypertrophied competing venous collaterals noted at the level of mid and distal forearm. Given history of difficulty with fistula cannulation, surgical or percutaneous ligation of these competing venous collaterals could  be performed as clinically indicated. 2. Successful ultrasound and fluoroscopic guided placement tunneled right internal jugular approach dialysis catheter placement with tips terminate within the superior aspect the right atrium. The dialysis catheter is ready for immediate use.  ACCESS: Given history of difficulty with fistula  cannulation, surgical or percutaneous ligation of competing venous collaterals at the level of the mid and distal forearm could be performed to improve fistula maturation as clinically indicated.  This was discussed with referring nephrologist, Dr. Melvia Heaps, immediately following the diagnostic fistulagram.   Electronically Signed   By: Sandi Mariscal M.D.   On: 06/25/2015 16:11   Ir US Guide Vasc Access Right  06/25/2015   INDICATION: History of chronic renal insufficiency, now progressed to end-stage renal disease.  Patient had left forearm native AV fistula created approximately 1 year ago in anticipation of impending dialysis access need. The patient was able to received limited dialysis via this left upper extremity fistula earlier this week [facility] the [facility] though was admitted with congestive heart failure where inpatient attempted cannulation of the fistula ultimately proved unsuccessful.  As such, request made for a diagnostic left upper extremity fistulagram as well as potential hemodialysis catheter placement.  EXAM: 1. DIALYSIS AV SHUNTOGRAM/FISTULAGRAM 2. ULTRASOUND FLUOROSCOPIC GUIDED TUNNELED HEMODIALYSIS CATHETER PLACEMENT  COMPARISON:  None.  MEDICATIONS: Versed 1 mg IV; Fentanyl 50 mcg IV  CONTRAST:  40 cc Omnipaque 300  ANESTHESIA/SEDATION: Sedation time  10 minutes  FLUOROSCOPY TIME:  1 minute 24 seconds (23.5 mGy)  COMPLICATIONS: None immediate  PROCEDURE: The skin overlying the left forearm arm dialysis graft was prepped with Betadine in a sterile fashion, and a sterile drape was applied covering the operative field. A diagnostic shunt study was performed via an 18 gauge angiocatheter introduced into venous outflow. Venous drainage was assessed to the level of the central veins in the chest. Proximal shunt was studied by reflux maneuver with temporary compression of venous outflow.  Findings from the diagnostic fistulagram were discussed with referring nephrologist, Dr. Melvia Heaps, and  the decision was made to proceed with tunneled dialysis catheter placement.  The angiocath was removed and hemostasis was achieved with manual compression. A dressing was placed. The patient tolerated the procedure well without immediate post procedural complication.  The right neck and chest were prepped with chlorhexidine in a sterile fashion, and a sterile drape was applied covering the operative field. Maximum barrier sterile technique with sterile gowns and gloves were used for the procedure. A timeout was performed prior to the initiation of the procedure.  After creating a small venotomy incision, a micropuncture kit was utilized to access the right internal jugular vein under direct, real-time ultrasound guidance after the overlying soft tissues were anesthetized with 1% lidocaine with epinephrine. Ultrasound image documentation was performed. The microwire was kinked to measure appropriate catheter length. A stiff Glidewire was advanced to the level of the IVC and the micropuncture sheath was exchanged for a peel-away sheath. A hemosplit tunneled hemodialysis catheter measuring 23 cm from tip to cuff was tunneled in a retrograde fashion from the anterior chest wall to the venotomy incision.  The catheter was then placed through the peel-away sheath with tips ultimately positioned within the superior aspect of the right atrium. Final catheter positioning was confirmed and documented with a spot radiographic image. The catheter aspirates and flushes normally. The catheter was flushed with appropriate volume heparin dwells.  The catheter exit site was secured with a 0-Prolene retention suture. The venotomy incision was closed  with an interrupted 4-0 Vicryl, Dermabond and Steri-strips. Dressings were applied. The patient tolerated the procedure well without immediate post procedural complication.  FINDINGS: The patient's left forearm native radial-cephalic AV fistula is widely patent. There is hypertrophy of  several competing veins noted at the level of the mid forearm.  The fistula drains to the deep venous system of the left upper extremity at the level of the antecubital fossa.  The central venous system of the left upper extremity is widely patent to the superior aspect of the right atrium.  Reflux fistulagram demonstrates hypertrophy of several competing venous collaterals at the level of the distal forearm.  The arterial limb and anastomosis are widely patent.  After tunneled hemodialysis catheter placement, the tips of the dialysis catheter terminate within the superior aspect of the right atrium.  IMPRESSION: 1. The left forearm native AV fistula is widely patent though there are multiple hypertrophied competing venous collaterals noted at the level of mid and distal forearm. Given history of difficulty with fistula cannulation, surgical or percutaneous ligation of these competing venous collaterals could be performed as clinically indicated. 2. Successful ultrasound and fluoroscopic guided placement tunneled right internal jugular approach dialysis catheter placement with tips terminate within the superior aspect the right atrium. The dialysis catheter is ready for immediate use.  ACCESS: Given history of difficulty with fistula cannulation, surgical or percutaneous ligation of competing venous collaterals at the level of the mid and distal forearm could be performed to improve fistula maturation as clinically indicated.  This was discussed with referring nephrologist, Dr. Melvia Heaps, immediately following the diagnostic fistulagram.   Electronically Signed   By: Sandi Mariscal M.D.   On: 06/25/2015 16:11   Dg Chest Port 1 View  06/23/2015   CLINICAL DATA:  64 year old diabetic hypertensive male with acute shortness of breath that started during dialysis. Subsequent encounter.  EXAM: PORTABLE CHEST - 1 VIEW  COMPARISON:  04/04/2015.  FINDINGS: Cardiomegaly with pulmonary edema. Small pleural effusion suspected.  No  gross pneumothorax.  Calcified slightly tortuous aorta.  IMPRESSION: Cardiomegaly with pulmonary edema. Small pleural effusion suspected.  Calcified slightly tortuous aorta.   Electronically Signed   By: Genia Del M.D.   On: 06/23/2015 16:13   Ir Shuntogram/ Fistulagram Left Mod Sed  06/25/2015   INDICATION: History of chronic renal insufficiency, now progressed to end-stage renal disease.  Patient had left forearm native AV fistula created approximately 1 year ago in anticipation of impending dialysis access need. The patient was able to received limited dialysis via this left upper extremity fistula earlier this week [facility] though was admitted with congestive heart failure where inpatient attempted cannulation of the fistula ultimately proved unsuccessful.  As such, request made for a diagnostic left upper extremity fistulagram as well as potential hemodialysis catheter placement.  EXAM: 1. DIALYSIS AV SHUNTOGRAM/FISTULAGRAM 2. ULTRASOUND FLUOROSCOPIC GUIDED TUNNELED HEMODIALYSIS CATHETER PLACEMENT  COMPARISON:  None.  MEDICATIONS: Versed 1 mg IV; Fentanyl 50 mcg IV  CONTRAST:  40 cc Omnipaque 300  ANESTHESIA/SEDATION: Sedation time  10 minutes  FLUOROSCOPY TIME:  1 minute 24 seconds (75.1 mGy)  COMPLICATIONS: None immediate  PROCEDURE: The skin overlying the left forearm arm dialysis graft was prepped with Betadine in a sterile fashion, and a sterile drape was applied covering the operative field. A diagnostic shunt study was performed via an 18 gauge angiocatheter introduced into venous outflow. Venous drainage was assessed to the level of the central veins in the chest. Proximal shunt was  studied by reflux maneuver with temporary compression of venous outflow.  Findings from the diagnostic fistulagram were discussed with referring nephrologist, Dr. Melvia Heaps, and the decision was made to proceed with tunneled dialysis catheter placement.  The angiocath was removed and hemostasis  was achieved with manual compression. A dressing was placed. The patient tolerated the procedure well without immediate post procedural complication.  The right neck and chest were prepped with chlorhexidine in a sterile fashion, and a sterile drape was applied covering the operative field. Maximum barrier sterile technique with sterile gowns and gloves were used for the procedure. A timeout was performed prior to the initiation of the procedure.  After creating a small venotomy incision, a micropuncture kit was utilized to access the right internal jugular vein under direct, real-time ultrasound guidance after the overlying soft tissues were anesthetized with 1% lidocaine with epinephrine. Ultrasound image documentation was performed. The microwire was kinked to measure appropriate catheter length. A stiff Glidewire was advanced to the level of the IVC and the micropuncture sheath was exchanged for a peel-away sheath. A hemosplit tunneled hemodialysis catheter measuring 23 cm from tip to cuff was tunneled in a retrograde fashion from the anterior chest wall to the venotomy incision.  The catheter was then placed through the peel-away sheath with tips ultimately positioned within the superior aspect of the right atrium. Final catheter positioning was confirmed and documented with a spot radiographic image. The catheter aspirates and flushes normally. The catheter was flushed with appropriate volume heparin dwells.  The catheter exit site was secured with a 0-Prolene retention suture. The venotomy incision was closed with an interrupted 4-0 Vicryl, Dermabond and Steri-strips. Dressings were applied. The patient tolerated the procedure well without immediate post procedural complication.  FINDINGS: The patient's left forearm native radial-cephalic AV fistula is widely patent. There is hypertrophy of several competing veins noted at the level of the mid forearm.  The fistula drains to the deep venous system of the left  upper extremity at the level of the antecubital fossa.  The central venous system of the left upper extremity is widely patent to the superior aspect of the right atrium.  Reflux fistulagram demonstrates hypertrophy of several competing venous collaterals at the level of the distal forearm.  The arterial limb and anastomosis are widely patent.  After tunneled hemodialysis catheter placement, the tips of the dialysis catheter terminate within the superior aspect of the right atrium.  IMPRESSION: 1. The left forearm native AV fistula is widely patent though there are multiple hypertrophied competing venous collaterals noted at the level of mid and distal forearm. Given history of difficulty with fistula cannulation, surgical or percutaneous ligation of these competing venous collaterals could be performed as clinically indicated. 2. Successful ultrasound and fluoroscopic guided placement tunneled right internal jugular approach dialysis catheter placement with tips terminate within the superior aspect the right atrium. The dialysis catheter is ready for immediate use.  ACCESS: Given history of difficulty with fistula cannulation, surgical or percutaneous ligation of competing venous collaterals at the level of the mid and distal forearm could be performed to improve fistula maturation as clinically indicated.  This was discussed with referring nephrologist, Dr. Melvia Heaps, immediately following the diagnostic fistulagram.   Electronically Signed   By: Sandi Mariscal M.D.   On: 06/25/2015 16:11    [name], MD 06/29/2015, 6:55 AM PGY-1, [place] Intern pager: [phone], text pages welcome

## 2015-06-29 NOTE — Transfer of Care (Signed)
Immediate Anesthesia Transfer of Care Note  Patient: [name]  Procedure(s) Performed: Procedure(s): LIGATION OF LEFT ARM RADIOCEPHALIC ARTERIOVENOUS FISTULA SIDE BRANCHES (Left)  Patient Location: PACU  Anesthesia Type:MAC  Level of Consciousness: awake  Airway & Oxygen Therapy: Patient Spontanous Breathing  Post-op Assessment: Report given to RN, Post -op Vital signs reviewed and stable, Patient moving all extremities and Patient moving all extremities X 4  Post vital signs: Reviewed and stable  Last Vitals:  Filed Vitals:   06/29/15 0517  BP: 169/58  Pulse: 65  Temp: 36.6 C  Resp: 18    Complications: No apparent anesthesia complications

## 2015-06-29 NOTE — Anesthesia Procedure Notes (Signed)
Procedure Name: MAC Date/Time: 06/29/2015 7:30 AM Performed by: Jacquiline Doe A Pre-anesthesia Checklist: Patient identified, Emergency Drugs available, Suction available, Patient being monitored and Timeout performed Patient Re-evaluated:Patient Re-evaluated prior to inductionOxygen Delivery Method: Nasal cannula Intubation Type: IV induction Dental Injury: Teeth and Oropharynx as per pre-operative assessment

## 2015-06-29 NOTE — Interval H&P Note (Signed)
History and Physical Interval Note:  06/29/2015 7:12 AM  [name]  has presented today for surgery, with the diagnosis of End Stage Renal Disease N18.6  The various methods of treatment have been discussed with the patient and family. After consideration of risks, benefits and other options for treatment, the patient has consented to  Procedure(s): SIDE BRANCH LIGATION OF RADIOCEPHALIC ARTERIOVENOUS FISTULA (Left) as a surgical intervention .  The patient's history has been reviewed, patient examined, no change in status, stable for surgery.  I have reviewed the patient's chart and labs.  Questions were answered to the patient's satisfaction.     Adele Barthel

## 2015-06-29 NOTE — Care Management Important Message (Signed)
Important Message  Patient Details  Name: [name] MRN: [id number] Date of Birth: May 30, 1951   Medicare Important Message Given:  Yes-third notification given    [name] 06/29/2015, 2:56 PM

## 2015-06-30 ENCOUNTER — Encounter (HOSPITAL_COMMUNITY): Payer: Self-pay | Admitting: Vascular Surgery

## 2015-06-30 LAB — RENAL FUNCTION PANEL
ANION GAP: 10 (ref 5–15)
Albumin: 3.1 g/dL — ABNORMAL LOW (ref 3.5–5.0)
BUN: 62 mg/dL — ABNORMAL HIGH (ref 6–20)
CALCIUM: 8.2 mg/dL — AB (ref 8.9–10.3)
CHLORIDE: 99 mmol/L — AB (ref 101–111)
CO2: 25 mmol/L (ref 22–32)
CREATININE: 7.52 mg/dL — AB (ref 0.61–1.24)
GFR calc Af Amer: 8 mL/min — ABNORMAL LOW (ref >60)
GFR, EST NON AFRICAN AMERICAN: 7 mL/min — AB (ref >60)
GLUCOSE: 207 mg/dL — AB (ref 65–99)
PHOSPHORUS: 5.5 mg/dL — AB (ref 2.5–4.6)
POTASSIUM: 3.8 mmol/L (ref 3.5–5.1)
Sodium: 134 mmol/L — ABNORMAL LOW (ref 135–145)

## 2015-06-30 LAB — GLUCOSE, CAPILLARY
GLUCOSE-CAPILLARY: 97 mg/dL (ref 65–99)
Glucose-Capillary: 185 mg/dL — ABNORMAL HIGH (ref 65–99)
Glucose-Capillary: 204 mg/dL — ABNORMAL HIGH (ref 65–99)

## 2015-06-30 LAB — CBC
HCT: 30.7 % — ABNORMAL LOW (ref 39.0–52.0)
Hemoglobin: 9.8 g/dL — ABNORMAL LOW (ref 13.0–17.0)
MCH: 28.2 pg (ref 26.0–34.0)
MCHC: 31.9 g/dL (ref 30.0–36.0)
MCV: 88.2 fL (ref 78.0–100.0)
Platelets: 162 10*3/uL (ref 150–400)
RBC: 3.48 MIL/uL — ABNORMAL LOW (ref 4.22–5.81)
RDW: 15.1 % (ref 11.5–15.5)
WBC: 11.4 10*3/uL — ABNORMAL HIGH (ref 4.0–10.5)

## 2015-06-30 MED ORDER — PREDNISONE 20 MG PO TABS: 20.0000 mg | ORAL_TABLET | Freq: Every day | ORAL | Status: DC

## 2015-06-30 MED FILL — Thrombin For Soln 20000 Unit: CUTANEOUS | Qty: 1 | Status: AC

## 2015-06-30 NOTE — Procedures (Signed)
I was present at this dialysis session. I have reviewed the session itself and made appropriate changes.   TDC. Resting AVF after surgery yesterday.  Challenge to 109kg today.  Pt w/o complaint. BP up, should improve with HD.  Pearson Grippe  MD 06/30/2015, 9:08 AM

## 2015-06-30 NOTE — Progress Notes (Signed)
Orders received for pt discharge.  Discharge summary printed and reviewed with pt.  Explained medication regimen, and pt had no further questions at this time.  IV removed and site remains clean, dry, intact.  Telemetry removed.  Pt in stable condition and awaiting transport. 

## 2015-06-30 NOTE — Discharge Instructions (Signed)
You were admitted for shortness of breath following your first hemodialysis session. You had a surgery to fix your fistula for dialysis. You will need to follow up with your kidney doctor. Please continue taking your medications as prescribed so you don't retain lots of fluid.   Dialysis Dialysis is a procedure that replaces some of the work healthy kidneys do. It is done when you lose about 85-90% of your kidney function. It may also be done earlier if your symptoms may be improved by dialysis. During dialysis, wastes, salt, and extra water are removed from the blood, and the levels of certain chemicals in the blood (such as potassium) are maintained. Dialysis is done in sessions. Dialysis sessions are continued until the kidneys get better. If the kidneys cannot get better, such as in end-stage kidney disease, dialysis is continued for life or until you receive a new kidney (kidney transplant). There are two types of dialysis: hemodialysis and peritoneal dialysis. WHAT IS HEMODIALYSIS?  Hemodialysis is a type of dialysis in which a machine called a dialyzer is used to filter the blood. Before beginning hemodialysis, you will have surgery to create a site where blood can be removed from the body and returned to the body (vascular access). There are three types of vascular accesses:  Arteriovenous fistula. To create this type of access, an artery is connected to a vein (usually in the arm). A fistula takes 1-6 months to develop after surgery. If it develops properly, it usually lasts longer than the other types of vascular accesses. It is also less likely to become infected and cause blood clots.  Arteriovenous graft. To create this type of access, an artery and a vein in the arm are connected with a tube. A graft may be used within 2-3 weeks of surgery.  A venous catheter. To create this type of access, a thin, flexible tube (catheter) is placed in a large vein in your neck, chest, or groin. A catheter  may be used right away. It is usually used as a temporary access when dialysis needs to begin immediately. During hemodialysis, blood leaves the body through your access. It travels through a tube to the dialyzer, where it is filtered. The blood then returns to your body through another tube. Hemodialysis is usually performed by a health care provider at a hospital or dialysis center three times a week. Visits last about 3-4 hours. It may also be performed with the help of another person at home with training.  WHAT IS PERITONEAL DIALYSIS? Peritoneal dialysis is a type of dialysis in which the thin lining of the abdomen (peritoneum) is used as a filter. Before beginning peritoneal dialysis, you will have surgery to place a catheter in your abdomen. The catheter will be used to transfer a fluid called dialysate to and from your abdomen. At the start of a session, your abdomen is filled with dialysate. During the session, wastes, salt, and extra water in the blood pass through the peritoneum and into the dialysate. The dialysate is drained from the body at the end of the session. The process of filling and draining the dialysate is called an exchange. Exchanges are repeated until you have used up all the dialysate for the day. Peritoneal dialysis may be performed by you at home or at almost any other location. It is done every day. You may need up to five exchanges a day. The amount of time the dialysate is in your body between exchanges is called a dwell. The  dwell depends on the number of exchanges needed and the characteristics of the peritoneum. It usually varies from 1.5-3 hours. You may go about your day normally between exchanges. Alternately, the exchanges may be done at night while you sleep, using a machine called a cycler. WHICH TYPE OF DIALYSIS SHOULD I CHOOSE?  Both hemodialysis and peritoneal dialysis have advantages and disadvantages. Talk to your health care provider about which type of dialysis  would be best for you. Your lifestyle and preferences should be considered along with your medical condition. In some cases, only one type of dialysis may be an option.  Advantages of hemodialysis  It is done less often than peritoneal dialysis.  Someone else can do the dialysis for you.  If you go to a dialysis center, your health care provider will be able to recognize any problems right away.  If you go to a dialysis center, you can interact with others who are having dialysis. This can provide you with emotional support. Disadvantages of hemodialysis  Hemodialysis may cause cramps and low blood pressure. It may leave you feeling tired on the days you have the treatment.  If you go to a dialysis center, you will need to make weekly appointments and work around the center's schedule.  You will need to take extra care when traveling. If you go to a dialysis center, you will need to make special arrangements to visit a dialysis center near your destination. If you are having treatments at home, you will need to take the dialyzer with you to your destination.  You will need to avoid more foods than you would need to avoid on peritoneal dialysis. Advantages of peritoneal dialysis  It is less likely than hemodialysis to cause cramps and low blood pressure.  You may do exchanges on your own wherever you are, including when you travel.  You do not need to avoid as many foods as you do on hemodialysis. Disadvantages of peritoneal dialysis  It is done more often than hemodialysis.  Performing peritoneal dialysis requires you to have dexterity of the hands. You must also be able to lift bags.  You will have to learn sterilization techniques. You will need to practice them every day to reduce the risk of infection. WHAT CHANGES WILL I NEED TO MAKE TO MY DIET DURING DIALYSIS? Both hemodialysis and peritoneal dialysis require you to make some changes to your diet. For example, you will need  to limit your intake of foods high in the minerals phosphorus and potassium. You will also need to limit your fluid intake. Your dietitian can help you plan meals. A good meal plan can improve your dialysis and your health.  WHAT SHOULD I EXPECT WHEN BEGINNING DIALYSIS? Adjusting to the dialysis treatment, schedule, and diet can take some time. You may need to stop working and may not be able to do some of the things you normally do. You may feel anxious or depressed when beginning dialysis. Eventually, many people feel better overall because of dialysis. Some people are able to return to work after making some changes, such as reducing work intensity. WHERE CAN I FIND MORE INFORMATION?   [place]: www.kidney.org  [employer] Kidney Patients: BombTimer.gl  [employer]: www.kidneyfund.org Document Released: 02/18/2003 Document Revised: 04/14/2014 Document Reviewed: 01/22/2013 [facility] Patient Information 2015 [place], [place]. This information is not intended to replace advice given to you by your health care provider. Make sure you discuss any questions you have with your health  care provider.  

## 2015-06-30 NOTE — Progress Notes (Addendum)
  Postoperative hemodialysis access     Date of Surgery:  06/29/15 Surgeon: Bridgett Larsson  Subjective:  No complaints  PHYSICAL EXAMINATION:  Filed Vitals:   06/30/15 0619  BP: 180/80  Pulse:   Temp:   Resp:     Incision is c/d/i  Sensation in digits is intact;  There is  Thrill  There is bruit. The graft/fistula is palpable    ASSESSMENT/PLAN:  [name] is a 64 y.o. year old male who is s/p Ligation of side-branches of left brachiocephalic arteriovenous fistula x 3.  -graft/fistula is patent -pt does not have evidence of steal sx -f/u with Dr. Bridgett Larsson in 4 weeks to check maturation of AVF -will sign off-call as needed.   Leontine Locket, PA-C [facility] [phone]  Addendum  I have independently interviewed and examined the patient, and I agree with the physician assistant's findings.  Pt's fistula is already mature.  Ligation of the 3 significant side branches should result in additional dilation.  Large hematoma is present in the subcutaneous tissue at the prior cannulation site.  Can follow up in office in 4 weeks.  Adele Barthel, MD [facility] [place] Office: [phone] Pager: [phone]  06/30/2015, 8:03 AM

## 2015-06-30 NOTE — Progress Notes (Signed)
MD on call No new orders at this time will continue to monitor. Arthor Captain LPN

## 2015-06-30 NOTE — Progress Notes (Signed)
MD on call text paged and paged BP 180/80 manual Hr 55 Pt is scheduled for HD today awaiting response. Arthor Captain LPN

## 2015-06-30 NOTE — Progress Notes (Signed)
Family Medicine Teaching Service Daily Progress Note Intern Pager: [phone]  Patient name: [name] Medical record number: [id number] Date of birth: 08/07/1951 Age: 64 y.o. Gender: male  Primary Care Provider: Beverely Low, MD Consultants: nephrology Code Status: full  Pt Overview and Major Events to Date:  7/12: Admitted to floor 7/13: HD ~5 L removed  7/14: HD- A-fib and PVCs  7/15: HD 7/16: HD 7/18: AVF revision per vascular  Assessment and Plan: [name] is a 64 y.o. male presenting with volume overload and SOB following first HD session 7/12. PMH is significant for HTN, CHF, DM2, ESRD and MGUS  ESRD with volume overload: Improving symptoms with daily HD. Likely component of anxiety and discomfort. Will continue T[facility] HD at [facility] at discharge. Access: tunneled HD cath, resting AVF; management per vascular surgery.  - Per nephrology, stable and ready for discharge following vascular procedure - Holding lasix, continue lowered hydralazine while taking volume at HD - AVF revision on 7/18 per vascular  Knee pain: likely gout flair, warm, red and tender, h/o gout in fingers, no infectious symptoms - prednisone 20mg  daily  Chronic diastolic CHF: Repeat echo here unchanged: LVH, EF 55%, G1DD.  - Cautious volume removal; continue home medications  DM: Hb A1c 6.6% June 2016 - hold home glipizide - sensitive SSI  Chronic venous insufficiency: gets weekly unna boot changes at wound care - continue Unna boots: Usually gets [facility] Wednes[facility]. Current boots placed 7/13.  - WOC consult  HTN; Patient has been hypertensive due to volume excess. - per nephrology at this time  FEN/GI: renal/carb mod diet, saline lock IV Prophylaxis: SQ heparin  Disposition: home when euvolemic and cleared by renal  Subjective:  Patient denies any dyspnea or chest pain at this time. Admits to left arm pain where the fistula was revised yesterday. His BP is elevated to 180/80  and he is awaiting HD today.   Objective: Temp:  [97.9 F (36.6 C)-98.4 F (36.9 C)] 97.9 F (36.6 C) (07/19 0557) Pulse Rate:  [55-75] 55 (07/19 0557) Resp:  [15-18] 18 (07/19 0557) BP: (154-187)/(62-90) 180/80 mmHg (07/19 0619) SpO2:  [97 %-100 %] 98 % (07/19 0557) Weight:  [247 lb 12.8 oz (112.401 kg)] 247 lb 12.8 oz (112.401 kg) (07/19 0557) Physical Exam:  Gen: Alert 64 y.o.male lying in bed watching TV HEENT: MMM, posterior oropharynx clear Pulm: Non-labored; scant bibasilar crackles, no wheezes  CV: Regular rate, no murmur appreciated; L forearm AVF w/+thrill  GI: + BS; soft, non-tender, non-distended Skin: Bilateral unna boots. Ecchymosis on left forearm from AVF revision, incisions clean, dry and intact Neuro: A&Ox3, speech normal  Laboratory:  Recent Labs Lab 06/24/15 0320 06/26/15 0736 06/27/15 0736  WBC 9.8 9.2 9.8  HGB 9.4* 9.6* 9.5*  HCT 29.6* 30.9* 30.3*  PLT 131* 111* 118*    Recent Labs Lab 06/26/15 0736 06/27/15 0736 06/29/15 0301  NA 139 136 135  K 4.1 3.5 5.2*  CL 102 100* 100*  CO2 28 28 23   BUN 35* 33* 47*  CREATININE 5.87* 5.80* 6.90*  CALCIUM 8.2* 8.4* 8.7*  GLUCOSE 95 164* 148*    Imaging/Diagnostic Tests: Ir Fluoro Guide Cv Line Right  06/25/2015   INDICATION: History of chronic renal insufficiency, now progressed to end-stage renal disease.  Patient had left forearm native AV fistula created approximately 1 year ago in anticipation of impending dialysis access need. The patient was able to received limited dialysis via this left upper extremity fistula  earlier this week [facility] though was admitted with congestive heart failure where inpatient attempted cannulation of the fistula ultimately proved unsuccessful.  As such, request made for a diagnostic left upper extremity fistulagram as well as potential hemodialysis catheter placement.  EXAM: 1. DIALYSIS AV SHUNTOGRAM/FISTULAGRAM 2. ULTRASOUND FLUOROSCOPIC GUIDED  TUNNELED HEMODIALYSIS CATHETER PLACEMENT  COMPARISON:  None.  MEDICATIONS: Versed 1 mg IV; Fentanyl 50 mcg IV  CONTRAST:  40 cc Omnipaque 300  ANESTHESIA/SEDATION: Sedation time  10 minutes  FLUOROSCOPY TIME:  1 minute 24 seconds (10.9 mGy)  COMPLICATIONS: None immediate  PROCEDURE: The skin overlying the left forearm arm dialysis graft was prepped with Betadine in a sterile fashion, and a sterile drape was applied covering the operative field. A diagnostic shunt study was performed via an 18 gauge angiocatheter introduced into venous outflow. Venous drainage was assessed to the level of the central veins in the chest. Proximal shunt was studied by reflux maneuver with temporary compression of venous outflow.  Findings from the diagnostic fistulagram were discussed with referring nephrologist, Dr. Melvia Heaps, and the decision was made to proceed with tunneled dialysis catheter placement.  The angiocath was removed and hemostasis was achieved with manual compression. A dressing was placed. The patient tolerated the procedure well without immediate post procedural complication.  The right neck and chest were prepped with chlorhexidine in a sterile fashion, and a sterile drape was applied covering the operative field. Maximum barrier sterile technique with sterile gowns and gloves were used for the procedure. A timeout was performed prior to the initiation of the procedure.  After creating a small venotomy incision, a micropuncture kit was utilized to access the right internal jugular vein under direct, real-time ultrasound guidance after the overlying soft tissues were anesthetized with 1% lidocaine with epinephrine. Ultrasound image documentation was performed. The microwire was kinked to measure appropriate catheter length. A stiff Glidewire was advanced to the level of the IVC and the micropuncture sheath was exchanged for a peel-away sheath. A hemosplit tunneled hemodialysis catheter measuring 23 cm from tip to cuff  was tunneled in a retrograde fashion from the anterior chest wall to the venotomy incision.  The catheter was then placed through the peel-away sheath with tips ultimately positioned within the superior aspect of the right atrium. Final catheter positioning was confirmed and documented with a spot radiographic image. The catheter aspirates and flushes normally. The catheter was flushed with appropriate volume heparin dwells.  The catheter exit site was secured with a 0-Prolene retention suture. The venotomy incision was closed with an interrupted 4-0 Vicryl, Dermabond and Steri-strips. Dressings were applied. The patient tolerated the procedure well without immediate post procedural complication.  FINDINGS: The patient's left forearm native radial-cephalic AV fistula is widely patent. There is hypertrophy of several competing veins noted at the level of the mid forearm.  The fistula drains to the deep venous system of the left upper extremity at the level of the antecubital fossa.  The central venous system of the left upper extremity is widely patent to the superior aspect of the right atrium.  Reflux fistulagram demonstrates hypertrophy of several competing venous collaterals at the level of the distal forearm.  The arterial limb and anastomosis are widely patent.  After tunneled hemodialysis catheter placement, the tips of the dialysis catheter terminate within the superior aspect of the right atrium.  IMPRESSION: 1. The left forearm native AV fistula is widely patent though there are multiple hypertrophied competing venous collaterals noted at the  level of mid and distal forearm. Given history of difficulty with fistula cannulation, surgical or percutaneous ligation of these competing venous collaterals could be performed as clinically indicated. 2. Successful ultrasound and fluoroscopic guided placement tunneled right internal jugular approach dialysis catheter placement with tips terminate within the superior  aspect the right atrium. The dialysis catheter is ready for immediate use.  ACCESS: Given history of difficulty with fistula cannulation, surgical or percutaneous ligation of competing venous collaterals at the level of the mid and distal forearm could be performed to improve fistula maturation as clinically indicated.  This was discussed with referring nephrologist, Dr. Melvia Heaps, immediately following the diagnostic fistulagram.   Electronically Signed   By: Sandi Mariscal M.D.   On: 06/25/2015 16:11   Ir US Guide Vasc Access Right  06/25/2015   INDICATION: History of chronic renal insufficiency, now progressed to end-stage renal disease.  Patient had left forearm native AV fistula created approximately 1 year ago in anticipation of impending dialysis access need. The patient was able to received limited dialysis via this left upper extremity fistula earlier this week [facility] though was admitted with congestive heart failure where inpatient attempted cannulation of the fistula ultimately proved unsuccessful.  As such, request made for a diagnostic left upper extremity fistulagram as well as potential hemodialysis catheter placement.  EXAM: 1. DIALYSIS AV SHUNTOGRAM/FISTULAGRAM 2. ULTRASOUND FLUOROSCOPIC GUIDED TUNNELED HEMODIALYSIS CATHETER PLACEMENT  COMPARISON:  None.  MEDICATIONS: Versed 1 mg IV; Fentanyl 50 mcg IV  CONTRAST:  40 cc Omnipaque 300  ANESTHESIA/SEDATION: Sedation time  10 minutes  FLUOROSCOPY TIME:  1 minute 24 seconds (60.7 mGy)  COMPLICATIONS: None immediate  PROCEDURE: The skin overlying the left forearm arm dialysis graft was prepped with Betadine in a sterile fashion, and a sterile drape was applied covering the operative field. A diagnostic shunt study was performed via an 18 gauge angiocatheter introduced into venous outflow. Venous drainage was assessed to the level of the central veins in the chest. Proximal shunt was studied by reflux maneuver with temporary  compression of venous outflow.  Findings from the diagnostic fistulagram were discussed with referring nephrologist, Dr. Melvia Heaps, and the decision was made to proceed with tunneled dialysis catheter placement.  The angiocath was removed and hemostasis was achieved with manual compression. A dressing was placed. The patient tolerated the procedure well without immediate post procedural complication.  The right neck and chest were prepped with chlorhexidine in a sterile fashion, and a sterile drape was applied covering the operative field. Maximum barrier sterile technique with sterile gowns and gloves were used for the procedure. A timeout was performed prior to the initiation of the procedure.  After creating a small venotomy incision, a micropuncture kit was utilized to access the right internal jugular vein under direct, real-time ultrasound guidance after the overlying soft tissues were anesthetized with 1% lidocaine with epinephrine. Ultrasound image documentation was performed. The microwire was kinked to measure appropriate catheter length. A stiff Glidewire was advanced to the level of the IVC and the micropuncture sheath was exchanged for a peel-away sheath. A hemosplit tunneled hemodialysis catheter measuring 23 cm from tip to cuff was tunneled in a retrograde fashion from the anterior chest wall to the venotomy incision.  The catheter was then placed through the peel-away sheath with tips ultimately positioned within the superior aspect of the right atrium. Final catheter positioning was confirmed and documented with a spot radiographic image. The catheter aspirates and flushes normally. The catheter was  flushed with appropriate volume heparin dwells.  The catheter exit site was secured with a 0-Prolene retention suture. The venotomy incision was closed with an interrupted 4-0 Vicryl, Dermabond and Steri-strips. Dressings were applied. The patient tolerated the procedure well without immediate post  procedural complication.  FINDINGS: The patient's left forearm native radial-cephalic AV fistula is widely patent. There is hypertrophy of several competing veins noted at the level of the mid forearm.  The fistula drains to the deep venous system of the left upper extremity at the level of the antecubital fossa.  The central venous system of the left upper extremity is widely patent to the superior aspect of the right atrium.  Reflux fistulagram demonstrates hypertrophy of several competing venous collaterals at the level of the distal forearm.  The arterial limb and anastomosis are widely patent.  After tunneled hemodialysis catheter placement, the tips of the dialysis catheter terminate within the superior aspect of the right atrium.  IMPRESSION: 1. The left forearm native AV fistula is widely patent though there are multiple hypertrophied competing venous collaterals noted at the level of mid and distal forearm. Given history of difficulty with fistula cannulation, surgical or percutaneous ligation of these competing venous collaterals could be performed as clinically indicated. 2. Successful ultrasound and fluoroscopic guided placement tunneled right internal jugular approach dialysis catheter placement with tips terminate within the superior aspect the right atrium. The dialysis catheter is ready for immediate use.  ACCESS: Given history of difficulty with fistula cannulation, surgical or percutaneous ligation of competing venous collaterals at the level of the mid and distal forearm could be performed to improve fistula maturation as clinically indicated.  This was discussed with referring nephrologist, Dr. Melvia Heaps, immediately following the diagnostic fistulagram.   Electronically Signed   By: Sandi Mariscal M.D.   On: 06/25/2015 16:11   Dg Chest Port 1 View  06/23/2015   CLINICAL DATA:  64 year old diabetic hypertensive male with acute shortness of breath that started during dialysis. Subsequent encounter.   EXAM: PORTABLE CHEST - 1 VIEW  COMPARISON:  04/04/2015.  FINDINGS: Cardiomegaly with pulmonary edema. Small pleural effusion suspected.  No gross pneumothorax.  Calcified slightly tortuous aorta.  IMPRESSION: Cardiomegaly with pulmonary edema. Small pleural effusion suspected.  Calcified slightly tortuous aorta.   Electronically Signed   By: Genia Del M.D.   On: 06/23/2015 16:13   Ir Shuntogram/ Fistulagram Left Mod Sed  06/25/2015   INDICATION: History of chronic renal insufficiency, now progressed to end-stage renal disease.  Patient had left forearm native AV fistula created approximately 1 year ago in anticipation of impending dialysis access need. The patient was able to received limited dialysis via this left upper extremity fistula earlier this week [facility] the [facility] though was admitted with congestive heart failure where inpatient attempted cannulation of the fistula ultimately proved unsuccessful.  As such, request made for a diagnostic left upper extremity fistulagram as well as potential hemodialysis catheter placement.  EXAM: 1. DIALYSIS AV SHUNTOGRAM/FISTULAGRAM 2. ULTRASOUND FLUOROSCOPIC GUIDED TUNNELED HEMODIALYSIS CATHETER PLACEMENT  COMPARISON:  None.  MEDICATIONS: Versed 1 mg IV; Fentanyl 50 mcg IV  CONTRAST:  40 cc Omnipaque 300  ANESTHESIA/SEDATION: Sedation time  10 minutes  FLUOROSCOPY TIME:  1 minute 24 seconds (38.7 mGy)  COMPLICATIONS: None immediate  PROCEDURE: The skin overlying the left forearm arm dialysis graft was prepped with Betadine in a sterile fashion, and a sterile drape was applied covering the operative field. A diagnostic shunt study was performed via an 2  gauge angiocatheter introduced into venous outflow. Venous drainage was assessed to the level of the central veins in the chest. Proximal shunt was studied by reflux maneuver with temporary compression of venous outflow.  Findings from the diagnostic fistulagram were discussed with referring  nephrologist, Dr. Melvia Heaps, and the decision was made to proceed with tunneled dialysis catheter placement.  The angiocath was removed and hemostasis was achieved with manual compression. A dressing was placed. The patient tolerated the procedure well without immediate post procedural complication.  The right neck and chest were prepped with chlorhexidine in a sterile fashion, and a sterile drape was applied covering the operative field. Maximum barrier sterile technique with sterile gowns and gloves were used for the procedure. A timeout was performed prior to the initiation of the procedure.  After creating a small venotomy incision, a micropuncture kit was utilized to access the right internal jugular vein under direct, real-time ultrasound guidance after the overlying soft tissues were anesthetized with 1% lidocaine with epinephrine. Ultrasound image documentation was performed. The microwire was kinked to measure appropriate catheter length. A stiff Glidewire was advanced to the level of the IVC and the micropuncture sheath was exchanged for a peel-away sheath. A hemosplit tunneled hemodialysis catheter measuring 23 cm from tip to cuff was tunneled in a retrograde fashion from the anterior chest wall to the venotomy incision.  The catheter was then placed through the peel-away sheath with tips ultimately positioned within the superior aspect of the right atrium. Final catheter positioning was confirmed and documented with a spot radiographic image. The catheter aspirates and flushes normally. The catheter was flushed with appropriate volume heparin dwells.  The catheter exit site was secured with a 0-Prolene retention suture. The venotomy incision was closed with an interrupted 4-0 Vicryl, Dermabond and Steri-strips. Dressings were applied. The patient tolerated the procedure well without immediate post procedural complication.  FINDINGS: The patient's left forearm native radial-cephalic AV fistula is widely  patent. There is hypertrophy of several competing veins noted at the level of the mid forearm.  The fistula drains to the deep venous system of the left upper extremity at the level of the antecubital fossa.  The central venous system of the left upper extremity is widely patent to the superior aspect of the right atrium.  Reflux fistulagram demonstrates hypertrophy of several competing venous collaterals at the level of the distal forearm.  The arterial limb and anastomosis are widely patent.  After tunneled hemodialysis catheter placement, the tips of the dialysis catheter terminate within the superior aspect of the right atrium.  IMPRESSION: 1. The left forearm native AV fistula is widely patent though there are multiple hypertrophied competing venous collaterals noted at the level of mid and distal forearm. Given history of difficulty with fistula cannulation, surgical or percutaneous ligation of these competing venous collaterals could be performed as clinically indicated. 2. Successful ultrasound and fluoroscopic guided placement tunneled right internal jugular approach dialysis catheter placement with tips terminate within the superior aspect the right atrium. The dialysis catheter is ready for immediate use.  ACCESS: Given history of difficulty with fistula cannulation, surgical or percutaneous ligation of competing venous collaterals at the level of the mid and distal forearm could be performed to improve fistula maturation as clinically indicated.  This was discussed with referring nephrologist, Dr. Melvia Heaps, immediately following the diagnostic fistulagram.   Electronically Signed   By: Sandi Mariscal M.D.   On: 06/25/2015 16:11    [name], MD 06/30/2015, 7:05 AM PGY-1,  [facility] Intern pager: [phone], text pages welcome

## 2015-07-02 ENCOUNTER — Encounter (HOSPITAL_COMMUNITY): Payer: Medicare Other

## 2015-07-02 ENCOUNTER — Encounter: Payer: Medicare Other | Admitting: Vascular Surgery

## 2015-07-02 ENCOUNTER — Telehealth: Payer: Self-pay | Admitting: Vascular Surgery

## 2015-07-02 NOTE — Telephone Encounter (Signed)
Spoke with pt, dpm °

## 2015-07-02 NOTE — Telephone Encounter (Signed)
-----   Message from Mena Goes, RN sent at 06/30/2015  8:56 AM EDT ----- Regarding: schedule   ----- Message -----    From: Conrad [place], MD    Sent: 06/30/2015   8:04 AM      To: [facility] Charge Pool  [name] [id number] 26-Sep-1951  Follow-up: 4 weeks s/p sidebranch ligation L RC AVF

## 2015-07-07 ENCOUNTER — Ambulatory Visit (INDEPENDENT_AMBULATORY_CARE_PROVIDER_SITE_OTHER): Payer: Medicare Other | Admitting: Family Medicine

## 2015-07-07 ENCOUNTER — Encounter: Payer: Self-pay | Admitting: Family Medicine

## 2015-07-07 VITALS — BP 128/55 | HR 51 | Temp 97.5°F | Ht 71.0 in | Wt 249.3 lb

## 2015-07-07 DIAGNOSIS — I5030 Unspecified diastolic (congestive) heart failure: Secondary | ICD-10-CM | POA: Diagnosis not present

## 2015-07-07 DIAGNOSIS — N186 End stage renal disease: Secondary | ICD-10-CM | POA: Diagnosis not present

## 2015-07-07 DIAGNOSIS — Z992 Dependence on renal dialysis: Secondary | ICD-10-CM

## 2015-07-07 NOTE — Patient Instructions (Signed)
Thank you for coming in today! Things are looking great  We will set you up with our dietician but you must call her to talk about setting up an appointment. Give your kidney doctor a call to confirm which nephrologist will be seeing you for your kidneys

## 2015-07-07 NOTE — Progress Notes (Signed)
Subjective:     Patient ID: [name], male   DOB: Jul 04, 1951, 64 y.o.   MRN: [id number]  HPI Documentation in black by Brett Fairy MS3.   64 y/o male admitted from 06/23/15 through 06/30/15 for dyspnea/volume overload after first dialysis. Patient required HD during hospitalization. AVF was found to be nonfunctional and required revision. Had tunneled catheter placed for dialysis.   [name] is here for a hospital followup. He states his is feeling better after they took 40 lbs of fluid off of him and denies any shortness of breath increased leg swelling, cough or chest pain. His fistula was repaired and he will follow up in 4 weeks to look at the fistula and reassess his tunneled line. He denies any recent fevers, chills or night sweats, pain, redness or swelling at the HD line or fistula site. He states that his legs have gone down in size markedly.   Review of Systems Pertinent ROS as per HPI    Objective:   Physical Exam  Constitutional: He is oriented to person, place, and time. No distress.  HENT:  Head: Normocephalic and atraumatic.  Eyes: Conjunctivae are normal. Right eye exhibits no discharge. Left eye exhibits no discharge. No scleral icterus.  Cardiovascular: Normal rate and regular rhythm.  Exam reveals no gallop and no friction rub.   Murmur heard. Systolic murmur best heard at the right and left sternal borders. Grade III/IV. No S3 or S4 heard. Bilateral lower extremity edema. Legs wrapped in compression tape.   Pulmonary/Chest: Effort normal and breath sounds normal. No respiratory distress. He has no wheezes. He has no rales.  Abdominal: Soft. Bowel sounds are normal. He exhibits no distension. There is no tenderness. There is no rebound and no guarding.  Neurological: He is alert and oriented to person, place, and time.  Skin: He is not diaphoretic.  Slight bruising around the fistula site with dermabond overlying the incision sites.        Assessment:     [name] is a 64 y.o male with a PMHx of T2DM, HTN, HFpEF, lymphedema, ESRD, who is here for follow up for volume overload. No signs of SOB, or volume overload, lower extremity edema is much improved. Possibly new systolic murmur heard on exam.     Plan:     FOLLOW UP: Patient is doing well, denies SOB, cough, chest pain crackles, signs of congestion absent on exam. His lower extremity edema is much improved since admission. No signs of infection at HD line or fistula repair site both are healing well.   Patient expressed interest in dietician consult - provided info for patient and told him to call our dietician  Patient uncertain of who his new kidney doctor is as his old nephrologists office told him his kidneys would be taken care of at the center. - told patient to call nephrologist back and confirm with them who his new nephrologist is.  SYSTOLIC MURMUR: Grade 3/4 systolic murmur best heard at the left and right sternal boarders. Echo results from 7/14 show LVH no signs of valvular insufficiency. Some thickening of the aortic valve.  Will follow this asymptomatic at the moment.     I personally saw and evaluated the patient. I have reviewed the medical student note and agree with the documentation. I personally completed the physical exam. Additions to the medical student note are made in blue.   Dossie Arbour MD

## 2015-07-07 NOTE — Assessment & Plan Note (Signed)
64 y/o male presents for hospital follow up for volume overload/dyspnea. Admitted 7/12 - 7/19. Patient doing well post hospitalization. Required AVF reconstruction and had tunneled catheter placed. -patient to continue HD and schedule follow up with nephrology.

## 2015-07-07 NOTE — Assessment & Plan Note (Signed)
Patient admitted 7/12-7/19 for volume overload thought to be renal in etiology. Repeat Echo showed results as outlined in overview. Patient currently asymptomatic. -continue ASA/Statin/BB

## 2015-07-08 DIAGNOSIS — I890 Lymphedema, not elsewhere classified: Secondary | ICD-10-CM | POA: Diagnosis present

## 2015-07-08 DIAGNOSIS — E11622 Type 2 diabetes mellitus with other skin ulcer: Secondary | ICD-10-CM | POA: Diagnosis not present

## 2015-07-08 DIAGNOSIS — N186 End stage renal disease: Secondary | ICD-10-CM | POA: Diagnosis not present

## 2015-07-08 DIAGNOSIS — I87312 Chronic venous hypertension (idiopathic) with ulcer of left lower extremity: Secondary | ICD-10-CM | POA: Diagnosis not present

## 2015-07-08 DIAGNOSIS — N189 Chronic kidney disease, unspecified: Secondary | ICD-10-CM | POA: Diagnosis not present

## 2015-07-08 DIAGNOSIS — I87302 Chronic venous hypertension (idiopathic) without complications of left lower extremity: Secondary | ICD-10-CM | POA: Diagnosis not present

## 2015-07-08 DIAGNOSIS — I120 Hypertensive chronic kidney disease with stage 5 chronic kidney disease or end stage renal disease: Secondary | ICD-10-CM | POA: Diagnosis not present

## 2015-07-08 DIAGNOSIS — I509 Heart failure, unspecified: Secondary | ICD-10-CM | POA: Diagnosis not present

## 2015-07-15 ENCOUNTER — Encounter (HOSPITAL_BASED_OUTPATIENT_CLINIC_OR_DEPARTMENT_OTHER): Payer: Medicare Other | Attending: Surgery

## 2015-07-15 DIAGNOSIS — E11622 Type 2 diabetes mellitus with other skin ulcer: Secondary | ICD-10-CM | POA: Insufficient documentation

## 2015-07-15 DIAGNOSIS — I890 Lymphedema, not elsewhere classified: Secondary | ICD-10-CM | POA: Diagnosis not present

## 2015-07-15 DIAGNOSIS — I87311 Chronic venous hypertension (idiopathic) with ulcer of right lower extremity: Secondary | ICD-10-CM | POA: Insufficient documentation

## 2015-07-15 DIAGNOSIS — I87312 Chronic venous hypertension (idiopathic) with ulcer of left lower extremity: Secondary | ICD-10-CM | POA: Diagnosis not present

## 2015-07-15 DIAGNOSIS — N189 Chronic kidney disease, unspecified: Secondary | ICD-10-CM | POA: Diagnosis not present

## 2015-07-15 DIAGNOSIS — L97211 Non-pressure chronic ulcer of right calf limited to breakdown of skin: Secondary | ICD-10-CM | POA: Diagnosis not present

## 2015-07-15 DIAGNOSIS — L97221 Non-pressure chronic ulcer of left calf limited to breakdown of skin: Secondary | ICD-10-CM | POA: Diagnosis not present

## 2015-07-15 DIAGNOSIS — Z992 Dependence on renal dialysis: Secondary | ICD-10-CM | POA: Diagnosis not present

## 2015-07-15 DIAGNOSIS — I509 Heart failure, unspecified: Secondary | ICD-10-CM | POA: Insufficient documentation

## 2015-07-15 DIAGNOSIS — I10 Essential (primary) hypertension: Secondary | ICD-10-CM | POA: Insufficient documentation

## 2015-07-22 ENCOUNTER — Other Ambulatory Visit: Payer: Self-pay | Admitting: *Deleted

## 2015-07-22 DIAGNOSIS — L97211 Non-pressure chronic ulcer of right calf limited to breakdown of skin: Secondary | ICD-10-CM | POA: Diagnosis not present

## 2015-07-22 DIAGNOSIS — E872 Acidosis, unspecified: Secondary | ICD-10-CM

## 2015-07-22 DIAGNOSIS — I5020 Unspecified systolic (congestive) heart failure: Secondary | ICD-10-CM

## 2015-07-22 MED ORDER — SODIUM BICARBONATE 650 MG PO TABS: 650.0000 mg | ORAL_TABLET | Freq: Three times a day (TID) | ORAL | Status: DC

## 2015-07-22 MED ORDER — HYDRALAZINE HCL 100 MG PO TABS: 100.0000 mg | ORAL_TABLET | Freq: Two times a day (BID) | ORAL | Status: DC

## 2015-07-29 DIAGNOSIS — L97211 Non-pressure chronic ulcer of right calf limited to breakdown of skin: Secondary | ICD-10-CM | POA: Diagnosis not present

## 2015-07-30 ENCOUNTER — Encounter: Payer: Self-pay | Admitting: Vascular Surgery

## 2015-07-31 ENCOUNTER — Encounter: Payer: Medicare Other | Admitting: Vascular Surgery

## 2015-08-06 ENCOUNTER — Encounter: Payer: Self-pay | Admitting: Vascular Surgery

## 2015-08-07 ENCOUNTER — Ambulatory Visit (HOSPITAL_COMMUNITY)
Admit: 2015-08-07 | Discharge: 2015-08-07 | Disposition: A | Discharge status: Home / Self Care | Payer: Medicare Other | Source: Ambulatory Visit | Attending: Vascular Surgery | Admitting: Vascular Surgery

## 2015-08-07 ENCOUNTER — Ambulatory Visit (INDEPENDENT_AMBULATORY_CARE_PROVIDER_SITE_OTHER): Payer: Medicare Other | Admitting: Vascular Surgery

## 2015-08-07 ENCOUNTER — Encounter: Payer: Self-pay | Admitting: Vascular Surgery

## 2015-08-07 VITALS — BP 156/63 | HR 62 | Temp 98.1°F | Ht 71.0 in | Wt 240.0 lb

## 2015-08-07 DIAGNOSIS — N186 End stage renal disease: Secondary | ICD-10-CM | POA: Diagnosis not present

## 2015-08-07 DIAGNOSIS — L97929 Non-pressure chronic ulcer of unspecified part of left lower leg with unspecified severity: Secondary | ICD-10-CM

## 2015-08-07 DIAGNOSIS — L97919 Non-pressure chronic ulcer of unspecified part of right lower leg with unspecified severity: Secondary | ICD-10-CM

## 2015-08-07 DIAGNOSIS — R609 Edema, unspecified: Secondary | ICD-10-CM | POA: Diagnosis not present

## 2015-08-07 DIAGNOSIS — I83029 Varicose veins of left lower extremity with ulcer of unspecified site: Secondary | ICD-10-CM

## 2015-08-07 DIAGNOSIS — I83899 Varicose veins of unspecified lower extremities with other complications: Secondary | ICD-10-CM | POA: Insufficient documentation

## 2015-08-07 DIAGNOSIS — I872 Venous insufficiency (chronic) (peripheral): Secondary | ICD-10-CM | POA: Diagnosis not present

## 2015-08-07 DIAGNOSIS — I83019 Varicose veins of right lower extremity with ulcer of unspecified site: Secondary | ICD-10-CM | POA: Insufficient documentation

## 2015-08-07 DIAGNOSIS — I83893 Varicose veins of bilateral lower extremities with other complications: Secondary | ICD-10-CM

## 2015-08-07 NOTE — Progress Notes (Signed)
Referred by:  Christin Fudge, MD [address] [place], [place] [address]  Reason for referral: Swollen bilateral leg  History of Present Illness  [name] is a 64 y.o. ([birth year]) male who presents with chief complaint: chronic recurrent venous stasis ulcers in both legs.  Patient notes, onset of swelling years ago, associated with no obvious trigger.  The patient's symptoms include: ulcerations and swelling.  The patient has had no history of DVT, known history of varicose vein, known history of venous stasis ulcers, known history of  Lymphedema and known history of skin changes in lower legs.  There is no family history of venous disorders.  The patient has used knee high compression stockings in the past.  Pt has had prior R CFA to PT bypass with Dr. Oneida Alar.  Pt also underwent L RC AVF SBL on 06/30/15  Past Medical History  Diagnosis Date  . Diabetes mellitus   . Hypertension   . Hyperlipidemia   . Chronic kidney disease   . PVD (peripheral vascular disease)   . Venous insufficiency   . Morbid obesity   . Chronic cystitis   . Arthritis   . UMBILICAL HERNIA [id number]    Qualifier: History of  By: [name] MD, Audelia Acton    . NEPHROLITHIASIS, HX OF 12/02/2009    Qualifier: Diagnosis of  By: [name], Cat    . Lymphedema   . CHF (congestive heart failure)   . Asthma     as a child  . H/O hiatal hernia     states it's been fixed  . Dry skin     Past Surgical History  Procedure Laterality Date  . R knee arthoscopic repair of meniscus    . Umbilical hernia repair    . Amputation      Right and left fifth toes.   . Popliteal to posterior tibial bypass      2006  . Eye surgery Bilateral     cataract and lens implant  . Colonoscopy    . Hernia repair    . Av fistula placement Left 03/21/2014    Procedure: ARTERIOVENOUS (AV) FISTULA CREATION with ultrasound;  Surgeon: [name], MD;  Location[facility];  Service: Vascular;  Laterality: Left;  . Ligation of competing  branches of arteriovenous fistula Left 06/29/2015    Procedure: LIGATION OF LEFT ARM RADIOCEPHALIC ARTERIOVENOUS FISTULA SIDE BRANCHES;  Surgeon: Conrad Antioch, MD;  Location: [place];  Service: Vascular;  Laterality: Left;    Social History   Social History  . Marital Status: Legally Separated    Spouse Name: N/A  . Number of Children: 1  . Years of Education: N/A   Occupational History  .     Social History Main Topics  . Smoking status: Former Smoker    Quit date: 06/06/1979  . Smokeless tobacco: Former Systems developer  . Alcohol Use: No     Comment:   "years ago" , none now  . Drug Use: No  . Sexual Activity: No   Other Topics Concern  . Not on file   Social History Narrative   Lives alone.  Only son is deceased.     Family History  Problem Relation Age of Onset  . Diabetes Mother   . Heart disease Mother   . Diabetes Brother     Current Outpatient Prescriptions on File Prior to Visit  Medication Sig Dispense Refill  . amLODipine (NORVASC) 10 MG tablet Take  10 mg by mouth every morning.    [name] aspirin EC 81 MG tablet Take 81 mg by mouth every morning.    . carvedilol (COREG) 25 MG tablet Take 1 tablet (25 mg total) by mouth 2 (two) times daily with a meal. 60 tablet 11  . colchicine 0.6 MG tablet Take 0.5 tablets (0.3 mg total) by mouth daily. 30 tablet 0  . furosemide (LASIX) 80 MG tablet Take 80 mg by mouth 2 (two) times daily.    . hydrALAZINE (APRESOLINE) 100 MG tablet Take 1 tablet (100 mg total) by mouth 2 (two) times daily. 180 tablet 3  . predniSONE (DELTASONE) 20 MG tablet Take 1 tablet (20 mg total) by mouth daily with breakfast. 3 tablet 0  . sodium bicarbonate 650 MG tablet Take 1 tablet (650 mg total) by mouth 3 (three) times daily. 270 tablet 3  . atorvastatin (LIPITOR) 40 MG tablet Take 1 tablet (40 mg total) by mouth daily at 6 PM. (Patient not taking: Reported on 08/07/2015) 30 tablet 11  . calcitRIOL (ROCALTROL) 0.25 MCG capsule Take 0.25 mcg by mouth daily.       . darbepoetin (ARANESP) 100 MCG/0.5ML SOLN injection Inject 100 mcg into the skin every 14 (fourteen) days. [facility].     No current facility-administered medications on file prior to visit.    Allergies  Allergen Reactions  . Shrimp [Shellfish Allergy] Swelling  . Iodine Swelling    06/29/2015: Patient stated IV contrast dye and iodine on skin has not caused any reaction.    REVIEW OF SYSTEMS:  (Positives checked otherwise negative)  CARDIOVASCULAR:  []  chest pain, []  chest pressure, []  palpitations, []  shortness of breath when laying flat, []  shortness of breath with exertion,  [x]  pain in feet when walking, []  pain in feet when laying flat, []  history of blood clot in veins (DVT), []  history of phlebitis, [x]  swelling in legs, [x]  varicose veins  PULMONARY:  []  productive cough, []  asthma, []  wheezing  NEUROLOGIC:  []  weakness in arms or legs, []  numbness in arms or legs, []  difficulty speaking or slurred speech, []  temporary loss of vision in one eye, []  dizziness  HEMATOLOGIC:  []  bleeding problems, []  problems with blood clotting too easily  MUSCULOSKEL:  []  joint pain, []  joint swelling  GASTROINTEST:  []  vomiting blood, []  blood in stool     GENITOURINARY:  []  burning with urination, []  blood in urine  PSYCHIATRIC:  []  history of major depression  INTEGUMENTARY:  []  rashes, []  ulcers  CONSTITUTIONAL:  []  fever, []  chills   Physical Examination Filed Vitals:   08/07/15 1017  BP: 156/63  Pulse: 62  Temp: 98.1 F (36.7 C)  TempSrc: Oral  Height: 5\' 11"  (1.803 m)  Weight: 240 lb (108.863 kg)  SpO2: 100%   Body mass index is 33.49 kg/(m^2).  General: A&O x 3, WD, mildly obese  Head: [place]/AT  Ear/Nose/Throat: Hearing grossly intact, nares w/o erythema or drainage, oropharynx w/o Erythema/Exudate  Eyes: PERRLA, EOMI  Neck: Supple, no nuchal rigidity, no palpable LAD  Pulmonary: Sym exp, good air movt, CTAB, no rales, rhonchi, & wheezing  Cardiac:  RRR, Nl S1, S2, no Murmurs, rubs or gallops  Vascular: Vessel Right Left  Radial Palpable Palpable  Brachial Palpable Palpable  Carotid Palpable, without bruit Palpable, without bruit  Aorta Not palpable N/A  Femoral Palpable Palpable  Popliteal Not palpable Not palpable  PT Faintly Palpable Not Palpable  DP Not Palpable Not Palpable  Gastrointestinal: soft, NTND, -G/R, - HSM, - masses, - CVAT B  Musculoskeletal: M/S 5/5 throughout , Extremities without ischemic changes except post-surgical changes R foot from debridement, verrucuous skin on toes, circumferential skin changes in both calves consistent with healed venous stasis ulcers, R calf circumference > L, +extensive LDS  Neurologic: CN 2-12 intact , Pain and light touch intact in extremities , Motor exam as listed above  Psychiatric: Judgment intact, Mood & affect appropriate for pt's clinical situation  Dermatologic: See M/S exam for extremity exam, no rashes otherwise noted  Lymph : No Cervical, Axillary, or Inguinal lymphadenopathy    Non-Invasive Vascular Imaging   BLE Venous Insufficiency Duplex (Date: 08/07/2015):   RLE: no DVT and SVT, + GSV reflux distally, no deep venous reflux  LLE: no DVT and SVT, no GSV reflux, +deep venous reflux: CFV, SFV   Medical Decision Making  ISREAL HAUBNER is a 64 y.o. male who presents with: BLE mixed chronic venous insufficiency (C5) and lymphema, s/p R CFA to PT bypass, LLE PAD likely, s/p L RC AVF SBL   Pt lost to follow up in regards to his PAD.  Will try to re-establish him with Dr. Oneida Alar with BLE ABI and R leg arterial duplex in 3 months.  In regards to his L RC AVF, he can resume cannulation  In regards to his mixed lymphedema and CVI presentation, regardless of his mix of etiologies, he needs compressive therapy.  I discussed with the patient the use of her 20-30 mm thigh high compression stockings and need for 3 month trial of such.  The patient will follow up in  3 months with my partners in the [facility] for evaluation for: possible EVLA R GSV.  I doubt ablation will resolve all of his problems, but clearly his CVI in his R leg is contributing to the asx in his two legs.  Thank you for allowing [place] to participate in this patient's care.  Adele Barthel, MD [facility] [place] Office: [phone] Pager: [phone]  08/07/2015, 10:43 AM

## 2015-08-07 NOTE — Addendum Note (Signed)
Addended by: Dorthula Rue L on: 08/07/2015 04:15 PM   Modules accepted: Orders

## 2015-08-21 ENCOUNTER — Ambulatory Visit (INDEPENDENT_AMBULATORY_CARE_PROVIDER_SITE_OTHER): Payer: Medicare Other | Admitting: Family Medicine

## 2015-08-21 ENCOUNTER — Encounter: Payer: Self-pay | Admitting: Family Medicine

## 2015-08-21 VITALS — BP 128/61 | HR 60 | Temp 97.7°F

## 2015-08-21 DIAGNOSIS — T148 Other injury of unspecified body region: Secondary | ICD-10-CM

## 2015-08-21 DIAGNOSIS — N390 Urinary tract infection, site not specified: Secondary | ICD-10-CM

## 2015-08-21 DIAGNOSIS — T148XXA Other injury of unspecified body region, initial encounter: Secondary | ICD-10-CM

## 2015-08-21 LAB — POCT URINALYSIS DIPSTICK
Bilirubin, UA: NEGATIVE
GLUCOSE UA: NEGATIVE
KETONES UA: NEGATIVE
Nitrite, UA: NEGATIVE
Protein, UA: >=300
Spec Grav, UA: 1.025
UROBILINOGEN UA: 0.2
pH, UA: 7.5

## 2015-08-21 LAB — POCT UA - MICROSCOPIC ONLY

## 2015-08-21 NOTE — Patient Instructions (Addendum)
Thank you for coming to see me today. It was a pleasure. Today we talked about:   Right stomach pain: this may be related to a muscle injury. Please avoid movements that make this worse. Continue Tylenol 500mg  1-2 tablets every 8 hours as needed. Follow-up if symptoms worsen. You will get a call about the results of your urine test.  If you have any questions or concerns, please do not hesitate to call the office at [phone].  Sincerely,  Cordelia Poche, MD

## 2015-08-21 NOTE — Progress Notes (Signed)
    Subjective   [name] is a 64 y.o. male that presents for a same day visit  1. Right lower abdominal pain: Patient has a history of ESRD and is currently on dialysis TTS and still produces urine. His pain symptoms started about one week ago with intermittent pain with certain movements and no has progressed to occuring more frequently with movements. His pain is described as slightly sharp and generally lasts about 2 minutes. He reports his pain is about the same. He has taken Tylenol 1000mg  this morning which helped with his pain. No recent injuries. He has noticed cloudy urine but no hematuria. He has no history of kidney stones. He has no dysuria but has some hesitancy.  ROS Per HPI  Social History  Substance Use Topics  . Smoking status: Former Smoker    Quit date: 06/06/1979  . Smokeless tobacco: Former Systems developer  . Alcohol Use: No     Comment:   "years ago" , none now    Allergies  Allergen Reactions  . Shrimp [Shellfish Allergy] Swelling  . Iodine Swelling    06/29/2015: Patient stated IV contrast dye and iodine on skin has not caused any reaction.    Objective   BP 128/61 mmHg  Pulse 60  Temp(Src) 97.7 F (36.5 C) (Oral)  General: Well appearing, no distress Abdomen: Soft, non-tender, non-distended, mcburney negative. No rebound or guarding Musculoskeletal: slight pain in side abdominal wall when bending lower back to the left Neuro: Alert, oriented  Assessment and Plan   Meds ordered this encounter  Medications  . cephALEXin (KEFLEX) 500 MG capsule    Sig: Take 1 capsule (500 mg total) by mouth 2 (two) times daily.    Dispense:  14 capsule    Refill:  0    Side wall pain: appears musculoskeletal. Patient performs repeated stretching to the left which most likely contributes   Discussed behavior modification to minimize aggravation  Return precautions  Urinary Tract Infection: patient without fever or other systemic symptoms that would indicate  pyelonephritis.  Keflex  Await urine culture results/sensitivities

## 2015-08-23 MED ORDER — CEPHALEXIN 500 MG PO CAPS: 500.0000 mg | ORAL_CAPSULE | Freq: Two times a day (BID) | ORAL | Status: DC

## 2015-08-24 LAB — URINE CULTURE: Colony Count: >=100000

## 2015-08-25 ENCOUNTER — Telehealth: Payer: Self-pay | Admitting: *Deleted

## 2015-08-25 NOTE — Telephone Encounter (Signed)
Pt confirmed he has picked up Rx. Katharina Caper, Keiko Myricks D, [place]

## 2015-08-25 NOTE — Telephone Encounter (Signed)
-----   Message from Mariel Aloe, MD sent at 08/25/2015  8:14 AM EDT ----- Patient's urine grew bacteria sensitive to Keflex. Keflex was called into the pharmacy. Please call patient to ensure he has picked up his prescription. Thanks!

## 2015-08-26 ENCOUNTER — Other Ambulatory Visit (HOSPITAL_COMMUNITY): Payer: Medicare Other

## 2015-08-26 ENCOUNTER — Encounter (HOSPITAL_COMMUNITY): Payer: Medicare Other

## 2015-09-03 ENCOUNTER — Ambulatory Visit: Payer: Medicare Other | Admitting: Vascular Surgery

## 2015-09-08 ENCOUNTER — Encounter: Payer: Self-pay | Admitting: Vascular Surgery

## 2015-09-08 ENCOUNTER — Ambulatory Visit (HOSPITAL_COMMUNITY)
Admission: RE | Admit: 2015-09-08 | Discharge: 2015-09-08 | Disposition: A | Discharge status: Home / Self Care | Payer: Medicare Other | Source: Ambulatory Visit | Attending: Vascular Surgery | Admitting: Vascular Surgery

## 2015-09-08 ENCOUNTER — Ambulatory Visit (INDEPENDENT_AMBULATORY_CARE_PROVIDER_SITE_OTHER)
Admission: RE | Admit: 2015-09-08 | Discharge: 2015-09-08 | Disposition: A | Discharge status: Home / Self Care | Payer: Medicare Other | Source: Ambulatory Visit | Attending: Vascular Surgery | Admitting: Vascular Surgery

## 2015-09-08 DIAGNOSIS — L97919 Non-pressure chronic ulcer of unspecified part of right lower leg with unspecified severity: Secondary | ICD-10-CM

## 2015-09-08 DIAGNOSIS — Z95828 Presence of other vascular implants and grafts: Secondary | ICD-10-CM | POA: Insufficient documentation

## 2015-09-08 DIAGNOSIS — E119 Type 2 diabetes mellitus without complications: Secondary | ICD-10-CM | POA: Diagnosis not present

## 2015-09-08 DIAGNOSIS — Z48812 Encounter for surgical aftercare following surgery on the circulatory system: Secondary | ICD-10-CM | POA: Diagnosis present

## 2015-09-08 DIAGNOSIS — I83893 Varicose veins of bilateral lower extremities with other complications: Secondary | ICD-10-CM | POA: Diagnosis not present

## 2015-09-08 DIAGNOSIS — N186 End stage renal disease: Secondary | ICD-10-CM

## 2015-09-08 DIAGNOSIS — I872 Venous insufficiency (chronic) (peripheral): Secondary | ICD-10-CM

## 2015-09-08 DIAGNOSIS — I83019 Varicose veins of right lower extremity with ulcer of unspecified site: Secondary | ICD-10-CM

## 2015-09-08 DIAGNOSIS — E785 Hyperlipidemia, unspecified: Secondary | ICD-10-CM | POA: Insufficient documentation

## 2015-09-08 DIAGNOSIS — L97929 Non-pressure chronic ulcer of unspecified part of left lower leg with unspecified severity: Secondary | ICD-10-CM

## 2015-09-08 DIAGNOSIS — I10 Essential (primary) hypertension: Secondary | ICD-10-CM | POA: Insufficient documentation

## 2015-09-08 DIAGNOSIS — Z87891 Personal history of nicotine dependence: Secondary | ICD-10-CM | POA: Insufficient documentation

## 2015-09-08 DIAGNOSIS — I83029 Varicose veins of left lower extremity with ulcer of unspecified site: Secondary | ICD-10-CM

## 2015-09-10 ENCOUNTER — Encounter: Payer: Self-pay | Admitting: Vascular Surgery

## 2015-09-10 ENCOUNTER — Ambulatory Visit (INDEPENDENT_AMBULATORY_CARE_PROVIDER_SITE_OTHER): Payer: Medicare Other | Admitting: Vascular Surgery

## 2015-09-10 VITALS — BP 132/72 | HR 55 | Temp 98.7°F | Ht 71.0 in | Wt 238.0 lb

## 2015-09-10 DIAGNOSIS — N186 End stage renal disease: Secondary | ICD-10-CM | POA: Diagnosis not present

## 2015-09-10 DIAGNOSIS — I872 Venous insufficiency (chronic) (peripheral): Secondary | ICD-10-CM | POA: Diagnosis not present

## 2015-09-10 DIAGNOSIS — Z992 Dependence on renal dialysis: Secondary | ICD-10-CM

## 2015-09-10 DIAGNOSIS — I739 Peripheral vascular disease, unspecified: Secondary | ICD-10-CM

## 2015-09-10 NOTE — Addendum Note (Signed)
Addended by: Dorthula Rue L on: 09/10/2015 01:06 PM   Modules accepted: Orders

## 2015-09-10 NOTE — Progress Notes (Signed)
[facility] [place] HISTORY AND PHYSICAL   History of Present Illness:  Patient is a 64 y.o. year old male who presents for evaluation of leg swelling. The patient is known to me and previously underwent a right popliteal to posterior tibial bypass with vein in June 2006. I have not seen him for several years. He recently saw my partner Dr. Bridgett Larsson for lower extremity swelling. He recommended compression stockings for the patient and consideration for laser ablation. The patient states he develops cramping sensation in his right leg when on dialysis. He does not really describe claudication. He has a chronic exacerbation and remission ulceration on his lower extremities that have always healed with compression. He currently does compression therapy on his own and has not required going to the wound center recently.  Additionally she has also had a left radiocephalic AV fistula placed in his they are having some difficulty cannulating this. The fistula was placed several months ago. It is also had a side branch ligation procedure. He currently is dialyzing via a right-sided catheter. He occasionally complains of some stiffness and aching in his left hand and states that his hand will troll up and cramps sometimes when on dialysis. Not reported any ulcerations on the tips of his fingers.    Other medical problems include diabetes, hypertension, hyperlipidemia, end-stage renal disease on dialysis Tuesday Thursday Saturday, obesity, congestive heart failure all of which are currently stable.  Past Medical History  Diagnosis Date  . Diabetes mellitus   . Hypertension   . Hyperlipidemia   . Chronic kidney disease   . PVD (peripheral vascular disease)   . Venous insufficiency   . Morbid obesity   . Chronic cystitis   . Arthritis   . UMBILICAL HERNIA 99991111    Qualifier: History of  By: [name] MD, Audelia Acton    . NEPHROLITHIASIS, HX OF 12/02/2009    Qualifier: Diagnosis of  By: Ta  MD, Cat    . Lymphedema   . CHF (congestive heart failure)   . Asthma     as a child  . H/O hiatal hernia     states it's been fixed  . Dry skin     Past Surgical History  Procedure Laterality Date  . R knee arthoscopic repair of meniscus    . Umbilical hernia repair    . Amputation      Right and left fifth toes.   . Popliteal to posterior tibial bypass      2006  . Eye surgery Bilateral     cataract and lens implant  . Colonoscopy    . Hernia repair    . Av fistula placement Left 03/21/2014    Procedure: ARTERIOVENOUS (AV) FISTULA CREATION with ultrasound;  Surgeon: [name], MD;  Location: [facility] OR;  Service: Vascular;  Laterality: Left;  . Ligation of competing branches of arteriovenous fistula Left 06/29/2015    Procedure: LIGATION OF LEFT ARM RADIOCEPHALIC ARTERIOVENOUS FISTULA SIDE BRANCHES;  Surgeon: Conrad [place], MD;  Location: [facility];  Service: Vascular;  Laterality: Left;    Social History Social History  Substance Use Topics  . Smoking status: Former Smoker    Quit date: 06/06/1979  . Smokeless tobacco: Former Systems developer  . Alcohol Use: No     Comment:   "years ago" , none now    Family History Family History  Problem Relation Age of Onset  . Diabetes Mother   . Heart disease Mother   .  Diabetes Brother     Allergies  Allergies  Allergen Reactions  . Shrimp [Shellfish Allergy] Swelling  . Iodine Swelling    06/29/2015: Patient stated IV contrast dye and iodine on skin has not caused any reaction.     Current Outpatient Prescriptions  Medication Sig Dispense Refill  . amLODipine (NORVASC) 10 MG tablet Take 10 mg by mouth every morning.    [name] aspirin EC 81 MG tablet Take 81 mg by mouth every morning.    . calcitRIOL (ROCALTROL) 0.25 MCG capsule Take 0.25 mcg by mouth daily.     . calcium acetate (PHOSLO) 667 MG capsule     . carvedilol (COREG) 25 MG tablet Take 1 tablet (25 mg total) by mouth 2 (two) times daily with a meal. 60 tablet 11  . cephALEXin  (KEFLEX) 500 MG capsule Take 1 capsule (500 mg total) by mouth 2 (two) times daily. 14 capsule 0  . colchicine 0.6 MG tablet Take 0.5 tablets (0.3 mg total) by mouth daily. 30 tablet 0  . darbepoetin (ARANESP) 100 MCG/0.5ML SOLN injection Inject 100 mcg into the skin every 14 (fourteen) days. [facility].    . furosemide (LASIX) 80 MG tablet Take 80 mg by mouth 2 (two) times daily.    . sodium bicarbonate 650 MG tablet Take 1 tablet (650 mg total) by mouth 3 (three) times daily. 270 tablet 3  . atorvastatin (LIPITOR) 40 MG tablet Take 1 tablet (40 mg total) by mouth daily at 6 PM. (Patient not taking: Reported on 08/07/2015) 30 tablet 11  . hydrALAZINE (APRESOLINE) 100 MG tablet Take 1 tablet (100 mg total) by mouth 2 (two) times daily. 180 tablet 3  . predniSONE (DELTASONE) 20 MG tablet Take 1 tablet (20 mg total) by mouth daily with breakfast. (Patient not taking: Reported on 09/10/2015) 3 tablet 0   No current facility-administered medications for this visit.    ROS:   General:  No weight loss, Fever, chills  HEENT: No recent headaches, no nasal bleeding, no visual changes, no sore throat  Neurologic: No dizziness, blackouts, seizures. No recent symptoms of stroke or mini- stroke. No recent episodes of slurred speech, or temporary blindness.  Cardiac: No recent episodes of chest pain/pressure, no shortness of breath at rest.  No shortness of breath with exertion.  Denies history of atrial fibrillation or irregular heartbeat  Vascular: No history of rest pain in feet.  No history of claudication.  + history of non-healing ulcer, No history of DVT   Pulmonary: No home oxygen, no productive cough, no hemoptysis,  No asthma or wheezing  Musculoskeletal:  [ ]  Arthritis, [ ]  Low back pain,  [ ]  Joint pain  Hematologic:No history of hypercoagulable state.  No history of easy bleeding.  No history of anemia  Gastrointestinal: No hematochezia or melena,  No gastroesophageal reflux, no  trouble swallowing  Urinary: [ ]  chronic Kidney disease, [ ]  on HD - [ ]  MWF or [ ]  TTHS, [ ]  Burning with urination, [ ]  Frequent urination, [ ]  Difficulty urinating;   Skin: No rashes  Psychological: No history of anxiety,  No history of depression   Physical Examination  Filed Vitals:   09/10/15 0944  BP: 132/72  Pulse: 55  Temp: 98.7 F (37.1 C)  TempSrc: Oral  Height: 5\' 11"  (1.803 m)  Weight: 238 lb (107.956 kg)  SpO2: 100%    Body mass index is 33.21 kg/(m^2).  General:  Alert and oriented, no acute distress  HEENT: Normal Neck: No bruit or JVD Pulmonary: Clear to auscultation bilaterally Cardiac: Regular Rate and Rhythm  Abdomen: Soft, non-tender, non-distended, no mass, obese Skin: No rash, thickened woody tight skin bilateral lower extremities making pedal pulses difficult to palpate, no open wounds Extremity Pulses:  2+ radial, brachial, femoral, absent dorsalis pedis, posterior tibial pulses bilaterally but again this is most likely due to his thickened skin Musculoskeletal: No deformity or edema  Neurologic: Upper and lower extremity motor 5/5 and symmetric  DATA:  Patient had an arterial duplex exam performed on September 27 which showed possible narrowing of his bypass graft of 70% but otherwise it was widely patent. This was apparently in the midportion of the graft. Bilateral ABIs were greater than 1 with triphasic waveforms.  I also reviewed his previous venous reflux exam was performed on 08/07/2015. This showed reflux in his right greater saphenous vein with a vein diameter of 3-5 mm.  He had no evidence of superficial reflux on the left leg but did have some deep vein reflux   ASSESSMENT:  #1 peripheral arterial disease patient with narrowing of his right popliteal to posterior tibial bypass and will need continued surveillance of this.                              #2 venous reflux superficial system right leg. However the patient has had previous bypass in  this operation his vein diameter is only 3-5 mm in diameter and the patient may need this conduit in the future for revascularization. I would not perform laser ablation on this leg. I would recommend continued compression therapy which the patient has been very good at doing himself.                              #3 poorly maturing left radiocephalic AV fistula. The patient will be scheduled for a duplex exam of this next week and consideration given for a fistulogram of the left arm or consideration for a new access   PLAN:  See above  Ruta Hinds, MD [facility] [place]: [phone] Pager: [phone]

## 2015-09-30 ENCOUNTER — Ambulatory Visit (HOSPITAL_COMMUNITY)
Admission: RE | Admit: 2015-09-30 | Discharge: 2015-09-30 | Disposition: A | Discharge status: Home / Self Care | Payer: Medicare Other | Source: Ambulatory Visit | Attending: Vascular Surgery | Admitting: Vascular Surgery

## 2015-09-30 DIAGNOSIS — Z992 Dependence on renal dialysis: Secondary | ICD-10-CM | POA: Insufficient documentation

## 2015-09-30 DIAGNOSIS — I739 Peripheral vascular disease, unspecified: Secondary | ICD-10-CM | POA: Insufficient documentation

## 2015-09-30 DIAGNOSIS — E785 Hyperlipidemia, unspecified: Secondary | ICD-10-CM | POA: Diagnosis not present

## 2015-09-30 DIAGNOSIS — E119 Type 2 diabetes mellitus without complications: Secondary | ICD-10-CM | POA: Insufficient documentation

## 2015-09-30 DIAGNOSIS — I872 Venous insufficiency (chronic) (peripheral): Secondary | ICD-10-CM | POA: Insufficient documentation

## 2015-09-30 DIAGNOSIS — I120 Hypertensive chronic kidney disease with stage 5 chronic kidney disease or end stage renal disease: Secondary | ICD-10-CM | POA: Diagnosis not present

## 2015-09-30 DIAGNOSIS — N186 End stage renal disease: Secondary | ICD-10-CM | POA: Insufficient documentation

## 2015-10-01 ENCOUNTER — Ambulatory Visit: Payer: Medicare Other | Admitting: Vascular Surgery

## 2015-10-06 ENCOUNTER — Encounter: Payer: Self-pay | Admitting: Vascular Surgery

## 2015-10-08 ENCOUNTER — Ambulatory Visit (INDEPENDENT_AMBULATORY_CARE_PROVIDER_SITE_OTHER): Payer: Medicare Other | Admitting: Vascular Surgery

## 2015-10-08 ENCOUNTER — Encounter: Payer: Self-pay | Admitting: Vascular Surgery

## 2015-10-08 VITALS — BP 156/69 | HR 63 | Ht 71.0 in | Wt 238.0 lb

## 2015-10-08 DIAGNOSIS — Z992 Dependence on renal dialysis: Secondary | ICD-10-CM | POA: Diagnosis not present

## 2015-10-08 DIAGNOSIS — N186 End stage renal disease: Secondary | ICD-10-CM

## 2015-10-08 NOTE — Progress Notes (Signed)
[facility] [place] HISTORY AND PHYSICAL    History of Present Illness:  Patient is a 64 y.o. year old male who presents for follow-up regarding his left arm AV fistula. This has been functioning poorly with difficult cannulation. The fistula was placed approximately 18 months ago. It is radiocephalic AV fistula placed by Dr. Donnetta Hutching. It is also had side branch ligation by Dr. Bridgett Larsson approximate 3 months ago. He is currently dialyzing via a right-sided catheter. He occasionally complains of some stiffness and aching in his left hand and states that his hand will troll up and cramps sometimes when on dialysis. He has not reported any ulcerations on the tips of his fingers.    The patient is known to me and previously underwent a right popliteal to posterior tibial bypass with vein in June 2006. I have not seen him for several years. He recently saw my partner Dr. Bridgett Larsson for lower extremity swelling.The patient states he develops cramping sensation in his right leg when on dialysis. He does not really describe claudication. He has a chronic exacerbation and remission ulceration on his lower extremities that have always healed with compression. He currently does compression therapy on his own and has not required going to the wound center recently. Although he does have reflux in his superficial system on the right leg I do not believe he should have laser ablation due to the fact he may need this as conduit at some point in the future for his peripheral arterial disease.   Other medical problems include diabetes, hypertension, hyperlipidemia, end-stage renal disease on dialysis Tuesday Thursday Saturday, obesity, congestive heart failure all of which are currently stable.    Past Medical History   Diagnosis  Date   .  Diabetes mellitus     .  Hypertension     .  Hyperlipidemia     .  Chronic kidney disease     .  PVD (peripheral vascular disease)     .  Venous insufficiency     .   Morbid obesity     .  Chronic cystitis     .  Arthritis     .  UMBILICAL HERNIA  [id number]       Qualifier: History of  By: [name] MD, Audelia Acton     .  NEPHROLITHIASIS, HX OF  12/02/2009       Qualifier: Diagnosis of  By: [name], Cat     .  Lymphedema     .  CHF (congestive heart failure)     .  Asthma         as a child   .  H/O hiatal hernia         states it's been fixed   .  Dry skin         Past Surgical History   Procedure  Laterality  Date   .  R knee arthoscopic repair of meniscus       .  Umbilical hernia repair       .  Amputation           Right and left fifth toes.    .  Popliteal to posterior tibial bypass           2006   .  Eye surgery  Bilateral         cataract and lens implant   .  Colonoscopy       .  Hernia repair       .  Av fistula placement  Left  03/21/2014       Procedure: ARTERIOVENOUS (AV) FISTULA CREATION with ultrasound;  Surgeon: [name], MD;  Location: [facility];  Service: Vascular;  Laterality: Left;   .  Ligation of competing branches of arteriovenous fistula  Left  06/29/2015       Procedure: LIGATION OF LEFT ARM RADIOCEPHALIC ARTERIOVENOUS FISTULA SIDE BRANCHES;  Surgeon: Conrad Divernon, MD;  Location: [place];  Service: Vascular;  Laterality: Left;     Social History Social History   Substance Use Topics   .  Smoking status:  Former Smoker       Quit date:  06/06/1979   .  Smokeless tobacco:  Former Systems developer   .  Alcohol Use:  No         Comment:   "years ago" , none now     Family History Family History   Problem  Relation  Age of Onset   .  Diabetes  Mother     .  Heart disease  Mother     .  Diabetes  Brother       Allergies    Allergies   Allergen  Reactions   .  Shrimp [Shellfish Allergy]  Swelling   .  Iodine  Swelling       06/29/2015: Patient stated IV contrast dye and iodine on skin has not caused any reaction.        Current Outpatient Prescriptions on File Prior to Visit  Medication Sig Dispense Refill  . amLODipine  (NORVASC) 10 MG tablet Take 10 mg by mouth every morning.    [name] aspirin EC 81 MG tablet Take 81 mg by mouth every morning.    . calcitRIOL (ROCALTROL) 0.25 MCG capsule Take 0.25 mcg by mouth daily.     . calcium acetate (PHOSLO) 667 MG capsule     . carvedilol (COREG) 25 MG tablet Take 1 tablet (25 mg total) by mouth 2 (two) times daily with a meal. 60 tablet 11  . cephALEXin (KEFLEX) 500 MG capsule Take 1 capsule (500 mg total) by mouth 2 (two) times daily. 14 capsule 0  . colchicine 0.6 MG tablet Take 0.5 tablets (0.3 mg total) by mouth daily. 30 tablet 0  . darbepoetin (ARANESP) 100 MCG/0.5ML SOLN injection Inject 100 mcg into the skin every 14 (fourteen) days. [facility].    . furosemide (LASIX) 80 MG tablet Take 80 mg by mouth 2 (two) times daily.    . hydrALAZINE (APRESOLINE) 100 MG tablet Take 1 tablet (100 mg total) by mouth 2 (two) times daily. 180 tablet 3  . sodium bicarbonate 650 MG tablet Take 1 tablet (650 mg total) by mouth 3 (three) times daily. 270 tablet 3  . atorvastatin (LIPITOR) 40 MG tablet Take 1 tablet (40 mg total) by mouth daily at 6 PM. (Patient not taking: Reported on 08/07/2015) 30 tablet 11  . predniSONE (DELTASONE) 20 MG tablet Take 1 tablet (20 mg total) by mouth daily with breakfast. (Patient not taking: Reported on 09/10/2015) 3 tablet 0   No current facility-administered medications on file prior to visit.    ROS:    No shortness of breath no chest pain  Physical Examination    Filed Vitals:   10/08/15 0829 10/08/15 0831  BP: 173/71 156/69  Pulse: 63   Height: 5\' 11"  (1.803 m)   Weight: 238 lb (107.956 kg)   SpO2: 99%     General:  Alert  and oriented, no acute distress Extremities: Left radiocephalic AV fistula. Audible bruit palpable thrill in fistula but overall diameter is small   DATA:  patient had a duplex of his AV fistula which shows a diameter over most of the fistula is 4 mm. There is really no obvious high-grade narrowing or  stenosis.  ASSESSMENT:  poorly maturing left radiocephalic AV fistula. I do not believe the left radiocephalic AV fistula can be improved anymore than it currently is. I offered the patient today a left brachiocephalic AV fistula versus left forearm AV graft. He wishes to think about this for now. He will call us in the future if he wishes to proceed with a new access. We would also ligate his left radiocephalic AV fistula at the same procedure. As far as his peripheral arterial disease is concerned. He will follow-up in one year with a repeat graft duplex and ABIs. Continue conservative management for his venous reflux disease with compression stockings.   PLAN:  See above  Ruta Hinds, MD [facility] [place] Office: [phone] Pager: [phone]

## 2015-10-08 NOTE — Addendum Note (Signed)
Addended by: Dorthula Rue L on: 10/08/2015 10:41 AM   Modules accepted: Orders

## 2015-11-10 ENCOUNTER — Ambulatory Visit: Payer: Medicare Other | Admitting: Vascular Surgery

## 2015-11-25 ENCOUNTER — Encounter (HOSPITAL_BASED_OUTPATIENT_CLINIC_OR_DEPARTMENT_OTHER): Payer: Medicare Other | Attending: Surgery

## 2015-11-25 DIAGNOSIS — Z79899 Other long term (current) drug therapy: Secondary | ICD-10-CM | POA: Insufficient documentation

## 2015-11-25 DIAGNOSIS — E785 Hyperlipidemia, unspecified: Secondary | ICD-10-CM | POA: Insufficient documentation

## 2015-11-25 DIAGNOSIS — L98499 Non-pressure chronic ulcer of skin of other sites with unspecified severity: Secondary | ICD-10-CM | POA: Insufficient documentation

## 2015-11-25 DIAGNOSIS — E1122 Type 2 diabetes mellitus with diabetic chronic kidney disease: Secondary | ICD-10-CM | POA: Diagnosis not present

## 2015-11-25 DIAGNOSIS — N184 Chronic kidney disease, stage 4 (severe): Secondary | ICD-10-CM | POA: Diagnosis not present

## 2015-11-25 DIAGNOSIS — I509 Heart failure, unspecified: Secondary | ICD-10-CM | POA: Diagnosis not present

## 2015-11-25 DIAGNOSIS — I890 Lymphedema, not elsewhere classified: Secondary | ICD-10-CM | POA: Insufficient documentation

## 2015-11-25 DIAGNOSIS — Z7982 Long term (current) use of aspirin: Secondary | ICD-10-CM | POA: Insufficient documentation

## 2015-11-25 DIAGNOSIS — E11622 Type 2 diabetes mellitus with other skin ulcer: Secondary | ICD-10-CM | POA: Diagnosis present

## 2015-11-25 DIAGNOSIS — I130 Hypertensive heart and chronic kidney disease with heart failure and stage 1 through stage 4 chronic kidney disease, or unspecified chronic kidney disease: Secondary | ICD-10-CM | POA: Diagnosis not present

## 2015-11-25 DIAGNOSIS — Z992 Dependence on renal dialysis: Secondary | ICD-10-CM | POA: Insufficient documentation

## 2015-12-22 ENCOUNTER — Telehealth: Payer: Self-pay | Admitting: *Deleted

## 2015-12-22 NOTE — Telephone Encounter (Signed)
Called patient to offer flu vaccine.  Patient stated he wants to think about it first. Educated patient on importance of receiving flu vaccine and length of flu season.  Patient requested to make a f/u appt with Dr. Sherril Cong to check blood sugar. States he's been off all blood sugar meds X 1 year. Appt made with PCP for 12/30/15 at 10:30 Seri Kimmer, Orvis Brill, RN

## 2015-12-30 ENCOUNTER — Ambulatory Visit (INDEPENDENT_AMBULATORY_CARE_PROVIDER_SITE_OTHER): Payer: Medicare Other | Admitting: Family Medicine

## 2015-12-30 VITALS — BP 131/61 | HR 60 | Temp 98.5°F | Ht 71.0 in | Wt 244.5 lb

## 2015-12-30 DIAGNOSIS — M79645 Pain in left finger(s): Secondary | ICD-10-CM | POA: Diagnosis not present

## 2015-12-30 DIAGNOSIS — T82590D Other mechanical complication of surgically created arteriovenous fistula, subsequent encounter: Secondary | ICD-10-CM | POA: Diagnosis not present

## 2015-12-30 DIAGNOSIS — N186 End stage renal disease: Secondary | ICD-10-CM | POA: Diagnosis not present

## 2015-12-30 DIAGNOSIS — E119 Type 2 diabetes mellitus without complications: Secondary | ICD-10-CM | POA: Diagnosis not present

## 2015-12-30 DIAGNOSIS — Z992 Dependence on renal dialysis: Secondary | ICD-10-CM

## 2015-12-30 DIAGNOSIS — E1121 Type 2 diabetes mellitus with diabetic nephropathy: Secondary | ICD-10-CM

## 2015-12-30 DIAGNOSIS — I10 Essential (primary) hypertension: Secondary | ICD-10-CM

## 2015-12-30 LAB — POCT GLYCOSYLATED HEMOGLOBIN (HGB A1C): HEMOGLOBIN A1C: 5.7

## 2015-12-30 NOTE — Patient Instructions (Signed)
I am referring you to sports medicine for them to evaluate your thumb and possibly do an ultrasound to see if there is a problem in the joint or tendons.

## 2015-12-30 NOTE — Assessment & Plan Note (Signed)
DM well controlled off meds since ESRD, A1c 5.7 today - no meds, no hypoglycemia sx - known retinopathy, sees optho - f/u in 6 months

## 2015-12-30 NOTE — Assessment & Plan Note (Signed)
Fistula working well but having swelling, stiffness of thumb just distral to it. Swelling possibly related to fistula but stiffness and pain seems musculoskeletal, refer to sports med

## 2015-12-30 NOTE — Assessment & Plan Note (Signed)
Tolerating dialysis well at this point, still considering whether to get on transplant list. Followed by [facility] and [facility], unsure which is his primary at this point.

## 2015-12-30 NOTE — Assessment & Plan Note (Signed)
Swelling, stiffness of left thumb just distral to fistula. Swelling possibly related to fistula but stiffness and pain seems musculoskeletal. Some weakness on oposition and reports dropping things often. Gets cramped up and has pain and those times, some tenderness - refer to sports med for eval and possible ultrasound - rec he discuss with nephrologist as cramps may be dialysis-related

## 2015-12-30 NOTE — Assessment & Plan Note (Signed)
Well controlled, managed by renal, takes meds after dialysis on those days to prevent lows

## 2015-12-30 NOTE — Progress Notes (Signed)
Subjective:   [name] is a 65 y.o. male with a history of ESRD, CHF, gout, DM here for f/u DM  CHRONIC DIABETES  Disease Monitoring  Blood Sugar Ranges: does not check at home, A1c 5.7, 6.0 last time  Polyuria: no   Visual problems: yes, known retinopathy, no changes he has noticed   Medication Compliance: not on meds  Medication Side Effects  Hypoglycemia: no   Preventitive Health Care  Eye Exam: followed by optho regularly, not sure when last check was  Foot Exam: due, has pressure wraps in place so will defer  Diet pattern: regularly balanced meals  Exercise: minimal  Pt also complains of pain in his left thumb for the past few weeks. He notices it cramp up a lot during dialysis and get stuck adducted where he has to push at the base with his other hand to release it. At these times it is quite painful to try to abduct. He also has weakness in that hand and drops things often. He wonders if this might be related to his fistula but is not having any problems with the fistula itself, which is working well and not causing pain.  Review of Systems:  Per HPI. All other systems reviewed and are negative.   PMH, PSH, Medications, Allergies, and FmHx reviewed and updated in EMR.  Social History: former smoker  Objective:  BP 131/61 mmHg  Pulse 60  Temp(Src) 98.5 F (36.9 C) (Oral)  Ht 5\' 11"  (1.803 m)  Wt 244 lb 8 oz (110.904 kg)  BMI 34.12 kg/m2  Gen:  65 y.o. male in NAD HEENT: NCAT, MMM, EOMI, PERRL, anicteric sclerae CV: RRR, no MRG, no JVD, AV fistula on left forearm with palpable thrill Resp: Non-labored, CTAB, no wheezes noted Abd: Soft, NTND, BS present, no guarding or organomegaly Ext: WWP, no edema MSK: swelling at base of left thumb, weakness on opposition, tender over snuffbox where there is a scar likely from fistula operation Neuro: Alert and oriented, speech normal      Chemistry      Component Value Date/Time   NA 134* 06/30/2015 0920   NA  142 07/03/2014 0814   K 3.8 06/30/2015 0920   K 4.4 07/03/2014 0814   CL 99* 06/30/2015 0920   CO2 25 06/30/2015 0920   CO2 20* 07/03/2014 0814   BUN 62* 06/30/2015 0920   BUN 93.6* 07/03/2014 0814   CREATININE 7.52* 06/30/2015 0920   CREATININE 7.3* 07/03/2014 0814   CREATININE 5.95* 03/04/2014 1113      Component Value Date/Time   CALCIUM 8.2* 06/30/2015 0920   CALCIUM 8.6 07/03/2014 0814   CALCIUM 7.5* 03/07/2014 1909   ALKPHOS 43 04/05/2015 0533   ALKPHOS 52 07/03/2014 0814   AST 14 04/05/2015 0533   AST 10 07/03/2014 0814   ALT 30 04/05/2015 0533   ALT 13 07/03/2014 0814   BILITOT 0.7 04/05/2015 0533   BILITOT 0.34 07/03/2014 0814      Lab Results  Component Value Date   WBC 11.4* 06/30/2015   HGB 9.8* 06/30/2015   HCT 30.7* 06/30/2015   MCV 88.2 06/30/2015   PLT 162 06/30/2015   Lab Results  Component Value Date   TSH 3.764 06/26/2015   Lab Results  Component Value Date   HGBA1C 5.7 12/30/2015   Assessment & Plan:     [name] is a 65 y.o. male here for DM and thumb pain  ESRD (end stage renal disease) on  dialysis Tolerating dialysis well at this point, still considering whether to get on transplant list. Followed by [facility] and [facility], unsure which is his primary at this point.  Malfunction of arteriovenous dialysis fistula Fistula working well but having swelling, stiffness of thumb just distral to it. Swelling possibly related to fistula but stiffness and pain seems musculoskeletal, refer to sports med  Pain of left thumb Swelling, stiffness of left thumb just distral to fistula. Swelling possibly related to fistula but stiffness and pain seems musculoskeletal. Some weakness on oposition and reports dropping things often. Gets cramped up and has pain and those times, some tenderness - refer to sports med for eval and possible ultrasound - rec he discuss with nephrologist as cramps may be dialysis-related  DM (diabetes mellitus), type 2 with  renal complications DM well controlled off meds since ESRD, A1c 5.7 today - no meds, no hypoglycemia sx - known retinopathy, sees optho - f/u in 6 months  HYPERTENSION, BENIGN SYSTEMIC Well controlled, managed by renal, takes meds after dialysis on those days to prevent lows      Beverlyn Roux, MD, MPH [place] PGY-3 12/30/2015 11:11 AM

## 2016-01-06 ENCOUNTER — Emergency Department (HOSPITAL_COMMUNITY)
Admission: EM | Admit: 2016-01-06 | Discharge: 2016-01-06 | Disposition: A | Discharge status: Home / Self Care | Payer: Medicare Other | Attending: Emergency Medicine | Admitting: Emergency Medicine

## 2016-01-06 ENCOUNTER — Encounter: Payer: Self-pay | Admitting: Family Medicine

## 2016-01-06 ENCOUNTER — Ambulatory Visit (INDEPENDENT_AMBULATORY_CARE_PROVIDER_SITE_OTHER): Payer: Medicare Other | Admitting: Family Medicine

## 2016-01-06 ENCOUNTER — Encounter (HOSPITAL_COMMUNITY): Payer: Self-pay | Admitting: Emergency Medicine

## 2016-01-06 VITALS — BP 161/74 | HR 73 | Temp 97.4°F | Ht 71.0 in | Wt 258.0 lb

## 2016-01-06 DIAGNOSIS — N186 End stage renal disease: Secondary | ICD-10-CM | POA: Diagnosis not present

## 2016-01-06 DIAGNOSIS — I10 Essential (primary) hypertension: Secondary | ICD-10-CM

## 2016-01-06 DIAGNOSIS — Z7982 Long term (current) use of aspirin: Secondary | ICD-10-CM | POA: Insufficient documentation

## 2016-01-06 DIAGNOSIS — Z992 Dependence on renal dialysis: Secondary | ICD-10-CM

## 2016-01-06 DIAGNOSIS — J45909 Unspecified asthma, uncomplicated: Secondary | ICD-10-CM | POA: Diagnosis not present

## 2016-01-06 DIAGNOSIS — Z8719 Personal history of other diseases of the digestive system: Secondary | ICD-10-CM | POA: Insufficient documentation

## 2016-01-06 DIAGNOSIS — Z79899 Other long term (current) drug therapy: Secondary | ICD-10-CM | POA: Diagnosis not present

## 2016-01-06 DIAGNOSIS — Z872 Personal history of diseases of the skin and subcutaneous tissue: Secondary | ICD-10-CM | POA: Insufficient documentation

## 2016-01-06 DIAGNOSIS — N189 Chronic kidney disease, unspecified: Secondary | ICD-10-CM | POA: Insufficient documentation

## 2016-01-06 DIAGNOSIS — I509 Heart failure, unspecified: Secondary | ICD-10-CM | POA: Diagnosis not present

## 2016-01-06 DIAGNOSIS — E11649 Type 2 diabetes mellitus with hypoglycemia without coma: Secondary | ICD-10-CM | POA: Insufficient documentation

## 2016-01-06 DIAGNOSIS — I129 Hypertensive chronic kidney disease with stage 1 through stage 4 chronic kidney disease, or unspecified chronic kidney disease: Secondary | ICD-10-CM | POA: Diagnosis not present

## 2016-01-06 DIAGNOSIS — R42 Dizziness and giddiness: Secondary | ICD-10-CM

## 2016-01-06 DIAGNOSIS — E162 Hypoglycemia, unspecified: Secondary | ICD-10-CM

## 2016-01-06 DIAGNOSIS — Z87442 Personal history of urinary calculi: Secondary | ICD-10-CM | POA: Insufficient documentation

## 2016-01-06 DIAGNOSIS — Z87891 Personal history of nicotine dependence: Secondary | ICD-10-CM | POA: Diagnosis not present

## 2016-01-06 DIAGNOSIS — M6248 Contracture of muscle, other site: Secondary | ICD-10-CM | POA: Diagnosis not present

## 2016-01-06 DIAGNOSIS — M62838 Other muscle spasm: Secondary | ICD-10-CM

## 2016-01-06 DIAGNOSIS — E1121 Type 2 diabetes mellitus with diabetic nephropathy: Secondary | ICD-10-CM

## 2016-01-06 DIAGNOSIS — M1990 Unspecified osteoarthritis, unspecified site: Secondary | ICD-10-CM | POA: Diagnosis not present

## 2016-01-06 LAB — GLUCOSE, CAPILLARY
GLUCOSE-CAPILLARY: 35 mg/dL — AB (ref 65–99)
GLUCOSE-CAPILLARY: 65 mg/dL (ref 65–99)
GLUCOSE-CAPILLARY: 74 mg/dL (ref 65–99)
Glucose-Capillary: 25 mg/dL — CL (ref 65–99)

## 2016-01-06 LAB — COMPREHENSIVE METABOLIC PANEL
ALBUMIN: 3.5 g/dL (ref 3.5–5.0)
ALK PHOS: 62 U/L (ref 38–126)
ALT: 11 U/L — ABNORMAL LOW (ref 17–63)
ANION GAP: 14 (ref 5–15)
AST: 14 U/L — ABNORMAL LOW (ref 15–41)
BILIRUBIN TOTAL: 0.5 mg/dL (ref 0.3–1.2)
BUN: 31 mg/dL — ABNORMAL HIGH (ref 6–20)
CALCIUM: 9.4 mg/dL (ref 8.9–10.3)
CO2: 29 mmol/L (ref 22–32)
Chloride: 93 mmol/L — ABNORMAL LOW (ref 101–111)
Creatinine, Ser: 8.84 mg/dL — ABNORMAL HIGH (ref 0.61–1.24)
GFR, EST AFRICAN AMERICAN: 6 mL/min — AB (ref >60)
GFR, EST NON AFRICAN AMERICAN: 5 mL/min — AB (ref >60)
GLUCOSE: 101 mg/dL — AB (ref 65–99)
Potassium: 5 mmol/L (ref 3.5–5.1)
Sodium: 136 mmol/L (ref 135–145)
TOTAL PROTEIN: 8.4 g/dL — AB (ref 6.5–8.1)

## 2016-01-06 LAB — CBG MONITORING, ED
Glucose-Capillary: 49 mg/dL — ABNORMAL LOW (ref 65–99)
Glucose-Capillary: 73 mg/dL (ref 65–99)
Glucose-Capillary: 79 mg/dL (ref 65–99)
Glucose-Capillary: 74 mg/dL (ref 65–99)
Glucose-Capillary: 97 mg/dL (ref 65–99)
Glucose-Capillary: 127 mg/dL — ABNORMAL HIGH (ref 65–99)

## 2016-01-06 LAB — I-STAT CHEM 8, ED
BUN: 39 mg/dL — ABNORMAL HIGH (ref 6–20)
CALCIUM ION: 1.07 mmol/L — AB (ref 1.13–1.30)
CHLORIDE: 96 mmol/L — AB (ref 101–111)
CREATININE: 8.5 mg/dL — AB (ref 0.61–1.24)
GLUCOSE: 97 mg/dL (ref 65–99)
HCT: 41 % (ref 39.0–52.0)
Hemoglobin: 13.9 g/dL (ref 13.0–17.0)
Potassium: 5 mmol/L (ref 3.5–5.1)
Sodium: 135 mmol/L (ref 135–145)
TCO2: 30 mmol/L (ref 0–100)

## 2016-01-06 LAB — CBC
HEMATOCRIT: 37.2 % — AB (ref 39.0–52.0)
HEMOGLOBIN: 11.9 g/dL — AB (ref 13.0–17.0)
MCH: 31.2 pg (ref 26.0–34.0)
MCHC: 32 g/dL (ref 30.0–36.0)
MCV: 97.6 fL (ref 78.0–100.0)
Platelets: 154 10*3/uL (ref 150–400)
RBC: 3.81 MIL/uL — AB (ref 4.22–5.81)
RDW: 17.4 % — ABNORMAL HIGH (ref 11.5–15.5)
WBC: 9.9 10*3/uL (ref 4.0–10.5)

## 2016-01-06 MED ORDER — BACLOFEN 10 MG PO TABS: 5.0000 mg | ORAL_TABLET | Freq: Every evening | ORAL | Status: DC | PRN

## 2016-01-06 MED ORDER — GLUCOSE 40 % PO GEL
1.0000 | Freq: Once | ORAL | Status: AC
  Administered 2016-01-06: 37.5 g via ORAL

## 2016-01-06 NOTE — ED Provider Notes (Signed)
CSN: [id number]     Arrival date & time 01/06/16  1254 History   First MD Initiated Contact with Patient 01/06/16 1521     Chief Complaint  Patient presents with  . Hypoglycemia     (Consider location/radiation/quality/duration/timing/severity/associated sxs/prior Treatment) Patient is a 65 y.o. male presenting with hypoglycemia. The history is provided by the patient. No language interpreter was used.  Hypoglycemia Initial blood sugar:  35 Blood sugar after intervention:  79 Severity:  Moderate Onset quality:  Sudden Duration:  1 day Timing:  Constant Progression:  Worsening Chronicity:  New Diabetic status:  Controlled with diet Context: decreased oral intake   Relieved by:  Nothing Ineffective treatments:  None tried Associated symptoms: decreased responsiveness   Risk factors: family hx of diabetes   Risk factors: no alcohol abuse    patient has a past medical history of diabetes he is diet controlled he has end-stage renal failure. And peripheral vascular disease. Since glucose was 35 at his physician's office and he was sent to the emergency department for evaluation. Reports he has been fasting for the new year. Patient reports this is a part of his religion  Past Medical History  Diagnosis Date  . Diabetes mellitus   . Hypertension   . Hyperlipidemia   . Chronic kidney disease   . PVD (peripheral vascular disease) ([place])   . Venous insufficiency   . Morbid obesity ([place])   . Chronic cystitis   . Arthritis   . UMBILICAL HERNIA [id number]    Qualifier: History of  By: [name] MD, Audelia Acton    . NEPHROLITHIASIS, HX OF 12/02/2009    Qualifier: Diagnosis of  By: [name], Cat    . Lymphedema   . CHF (congestive heart failure) ([place])   . Asthma     as a child  . H/O hiatal hernia     states it's been fixed  . Dry skin    Past Surgical History  Procedure Laterality Date  . R knee arthoscopic repair of meniscus    . Umbilical hernia repair    . Amputation      Right and  left fifth toes.   . Popliteal to posterior tibial bypass      2006  . Eye surgery Bilateral     cataract and lens implant  . Colonoscopy    . Hernia repair    . Av fistula placement Left 03/21/2014    Procedure: ARTERIOVENOUS (AV) FISTULA CREATION with ultrasound;  Surgeon: [name], MD;  Location: [facility] OR;  Service: Vascular;  Laterality: Left;  . Ligation of competing branches of arteriovenous fistula Left 06/29/2015    Procedure: LIGATION OF LEFT ARM RADIOCEPHALIC ARTERIOVENOUS FISTULA SIDE BRANCHES;  Surgeon: Conrad S[place], MD;  Location: [place];  Service: Vascular;  Laterality: Left;   Family History  Problem Relation Age of Onset  . Diabetes Mother   . Heart disease Mother   . Diabetes Brother    Social History  Substance Use Topics  . Smoking status: Former Smoker    Quit date: 06/06/1979  . Smokeless tobacco: Former Systems developer  . Alcohol Use: No     Comment:   "years ago" , none now    Review of Systems  Constitutional: Positive for decreased responsiveness.  All other systems reviewed and are negative.     Allergies  Shrimp and Iodine  Home Medications   Prior to Admission medications   Medication Sig Start Date End Date Taking? Authorizing Provider  amLODipine (NORVASC) 10 MG tablet Take 10 mg by mouth every morning.   Yes Historical Provider, MD  aspirin EC 81 MG tablet Take 81 mg by mouth every morning.   Yes Historical Provider, MD  baclofen (LIORESAL) 10 MG tablet Take 0.5-1 tablets (5-10 mg total) by mouth at bedtime as needed for muscle spasms. 01/06/16  Yes Olin Hauser, DO  calcium acetate (PHOSLO) 667 MG capsule [phone] mg.  07/08/15  Yes Historical Provider, MD  carvedilol (COREG) 25 MG tablet Take 1 tablet (25 mg total) by mouth 2 (two) times daily with a meal. 03/11/14  Yes Frazier Richards, MD  colchicine 0.6 MG tablet Take 0.5 tablets (0.3 mg total) by mouth daily. Patient taking differently: Take 0.6-1.2 mg by mouth daily as needed (gout  flare).  04/06/15  Yes Barton Dubois, MD  darbepoetin (ARANESP) 100 MCG/0.5ML SOLN injection Inject 100 mcg into the skin every 14 (fourteen) days. [facility].   Yes Historical Provider, MD  furosemide (LASIX) 80 MG tablet Take 80 mg by mouth 2 (two) times daily.   Yes Historical Provider, MD  atorvastatin (LIPITOR) 40 MG tablet Take 1 tablet (40 mg total) by mouth daily at 6 PM. Patient not taking: Reported on 08/07/2015 03/11/14   Frazier Richards, MD  hydrALAZINE (APRESOLINE) 100 MG tablet Take 1 tablet (100 mg total) by mouth 2 (two) times daily. Patient not taking: Reported on 01/06/2016 07/22/15   Frazier Richards, MD  sodium bicarbonate 650 MG tablet Take 1 tablet (650 mg total) by mouth 3 (three) times daily. Patient not taking: Reported on 01/06/2016 07/22/15   Frazier Richards, MD   BP 167/89 mmHg  Pulse 64  Temp(Src) 99.3 F (37.4 C) (Oral)  Resp 16  SpO2 100% Physical Exam  Constitutional: He is oriented to person, place, and time. He appears well-developed and well-nourished.  HENT:  Head: Normocephalic and atraumatic.  Right Ear: External ear normal.  Mouth/Throat: Oropharynx is clear and moist.  Eyes: EOM are normal. Pupils are equal, round, and reactive to light.  Neck: Normal range of motion.  Cardiovascular: Normal rate and normal heart sounds.   Pulmonary/Chest: Effort normal.  Abdominal: Soft. He exhibits no distension. There is no tenderness.  Neurological: He is alert and oriented to person, place, and time.  Skin: Skin is warm.  Psychiatric: He has a normal mood and affect.  Nursing note and vitals reviewed.   ED Course  Procedures (including critical care time) Labs Review Labs Reviewed  CBC - Abnormal; Notable for the following:    RBC 3.81 (*)    Hemoglobin 11.9 (*)    HCT 37.2 (*)    RDW 17.4 (*)    All other components within normal limits  COMPREHENSIVE METABOLIC PANEL - Abnormal; Notable for the following:    Chloride 93 (*)    Glucose, Bld 101 (*)     BUN 31 (*)    Creatinine, Ser 8.84 (*)    Total Protein 8.4 (*)    AST 14 (*)    ALT 11 (*)    GFR calc non Af Amer 5 (*)    GFR calc Af Amer 6 (*)    All other components within normal limits  CBG MONITORING, ED - Abnormal; Notable for the following:    Glucose-Capillary 49 (*)    All other components within normal limits  I-STAT CHEM 8, ED - Abnormal; Notable for the following:    Chloride 96 (*)  BUN 39 (*)    Creatinine, Ser 8.50 (*)    Calcium, Ion 1.07 (*)    All other components within normal limits  CBG MONITORING, ED  CBG MONITORING, ED  CBG MONITORING, ED  CBG MONITORING, ED    Imaging Review No results found. I have personally reviewed and evaluated these images and lab results as part of my medical decision-making.   EKG Interpretation None      MDM  Pt given meal here on arrival and patient eating before discharge.  Pt advised he can not fast given his medical problems.  Pt is to eat and monitor his glucose   Final diagnoses:  Hypoglycemia    An After Visit Summary was printed and given to the patient.    [place], PA-C 01/06/16 1836  Virgel Manifold, MD 01/10/16 [phone]

## 2016-01-06 NOTE — ED Notes (Signed)
Patient states went to his doctor this morning and his sugar was 35.   Patient states that he was given a tube of D50 at his doctors office around 1045 or so.   Patient states that his CBG went up to 74.   Patient came straight here from MD office.   Patient CBG at triage was 49.  Patient wanted juice and peanut butter crackers and stated it would come up.   Will monitor in 15 to 20 minutes.   Patient is alert and oriented x 4 and asymptomatic.   Patient states he had tried to fast for the last couple of weeks.   Patient states he did eat this morning.

## 2016-01-06 NOTE — Progress Notes (Signed)
Subjective:    Patient ID: [name], male    DOB: Apr 26, 1951, 65 y.o.   MRN: [id number]  [name] is a 65 y.o. male presenting on 01/06/2016 for light headed  Patient presents for a same day appointment.  HPI  LIGHTHEADEDNESS / MUSCLE ACHES: - Patient presents today with several complaints. He describes episode of lightheadedness start this morning, seems persistent without significant resolution or worsening. He ate a regular breakfast this morning (including toast, jam, eggs). He has history of Type 2 Diabetes (not on any medications, no insulin, last 1 year ago on glipizide since discontinued due to hypoglycemia), has not checked CBG recently, ESRD on HD-Tues-Thurs-Sat, last had HD on Tuesday tolerated well, described "generalized aches including knees, hips, and neck started Tuesday night about 5 hours after HD. Also admitted to feeling "hot with some sweats" around midnight last night, this improved. - Denied feeling any dizziness, or vertigo, or worsening lightheadedness during orthostatic vitals today in office - Additionally reports some Left shoulder and Neck pain and stiffness for past 12 hours, he has been referred to Sports Medicine for Left Thumb pain by PCP, scheduled on 01/15/16 - History of gout. Tried colchicine 0.6mg  x2 yesterday and one today - Admits some mild blurred vision with this today, denies loss of vision - Denies any fevers/chills, chest pain, shortness of breath, nausea, vomiting, vertigo, HA, pre-syncope, fall  Social History   Social History  . Marital Status: Legally Separated    Spouse Name: N/A  . Number of Children: 1  . Years of Education: N/A   Occupational History  .     Social History Main Topics  . Smoking status: Former Smoker    Quit date: 06/06/1979  . Smokeless tobacco: Former Systems developer  . Alcohol Use: No     Comment:   "years ago" , none now  . Drug Use: No  . Sexual Activity: No   Other Topics Concern  . Not on file    Social History Narrative   Lives alone.  Only son is deceased.     Review of Systems Per HPI unless specifically indicated above     Objective:    BP 161/74 mmHg  Pulse 73  Temp(Src) 97.4 F (36.3 C) (Oral)  Ht 5\' 11"  (1.803 m)  Wt 258 lb (117.028 kg)  BMI 36.00 kg/m2   Orthostatic VS for the past 24 hrs (Last 3 readings):  BP- Lying Pulse- Lying BP- Sitting Pulse- Sitting BP- Standing at 0 minutes Pulse- Standing at 0 minutes  01/06/16 1050 181/77 mmHg 74 161/74 mmHg 73 176/69 mmHg 72     Wt Readings from Last 3 Encounters:  01/06/16 258 lb (117.028 kg)  12/30/15 244 lb 8 oz (110.904 kg)  10/08/15 238 lb (107.956 kg)    Physical Exam  Constitutional: He is oriented to person, place, and time. He appears well-developed and well-nourished. No distress.  Chronically ill appearing, obese, but today not ill appearing. Comfortable and cooperative, pleasant  HENT:  Head: Normocephalic and atraumatic.  Mouth/Throat: Oropharynx is clear and moist.  Eyes: EOM are normal. Pupils are equal, round, and reactive to light.  Neck: Normal range of motion. Neck supple.  Slightly reduced active ROM to Left due to pain. Left trapezius muscle spasm from neck across Left shoulder, mild tenderness to palpation. Spurling's negative for radiculopathy.  Cardiovascular: Normal rate, regular rhythm, normal heart sounds and intact distal pulses.   No murmur heard. Pulmonary/Chest: Effort normal and breath  sounds normal. No respiratory distress. He has no wheezes. He has no rales.  Musculoskeletal:  Left Shoulder Inspection: Appearance is similar to Right. No obvious deformity. Palpation: Tenderness localized to posterior-lateral and over Left trapezius with spasm. ROM: Reduced active forward flexion to 90* with limited above shoulder flexion, able to move patients arm above this passively, internal rotation slightly reduced but able to get arm behind back. Special Testing: Rotator cuff testing  intact supraspinatus and o'briens intact without weakness only mild pain, impingement testing negative for pain. Strength: rotator cuff 5/5 str Neurovascular: Distally intact   Neurological: He is alert and oriented to person, place, and time.  Skin: Skin is warm and dry. No rash noted. He is not diaphoretic.  Nursing note and vitals reviewed.     Results for orders placed or performed in visit on 01/06/16  Glucose, capillary  Result Value Ref Range   Glucose-Capillary 35 (LL) 65 - 99 mg/dL   Comment 1 Call MD NNP PA CNM   Glucose, capillary  Result Value Ref Range   Glucose-Capillary 25 (LL) 65 - 99 mg/dL   Comment 1 Call MD NNP PA CNM   Glucose, capillary  Result Value Ref Range   Glucose-Capillary 65 65 - 99 mg/dL  Glucose, capillary  Result Value Ref Range   Glucose-Capillary 74 65 - 99 mg/dL      Assessment & Plan:   Problem List Items Addressed This Visit    DM (diabetes mellitus), type 2 with renal complications (HCC)   Relevant Medications   dextrose (GLUTOSE) 40 % oral gel 37.5 g (Completed)   Other Relevant Orders   POCT CBG (Fasting - Glucose)   Glucose (CBG)   ESRD (end stage renal disease) on dialysis (HCC)   HYPERTENSION, BENIGN SYSTEMIC    Other Visit Diagnoses    Dizziness    -  Primary    Relevant Medications    dextrose (GLUTOSE) 40 % oral gel 37.5 g (Completed)    Trapezius muscle spasm        Relevant Medications    baclofen (LIORESAL) 10 MG tablet    Hypoglycemia associated with type 2 diabetes mellitus (HCC)        Relevant Medications    dextrose (GLUTOSE) 40 % oral gel 37.5 g (Completed)       Meds ordered this encounter  Medications  . baclofen (LIORESAL) 10 MG tablet    Sig: Take 0.5-1 tablets (5-10 mg total) by mouth at bedtime as needed for muscle spasms.    Dispense:  30 each    Refill:  0  . dextrose (GLUTOSE) 40 % oral gel 37.5 g    Sig:    Hypoglycemia, Lightheadedness Concern with recent episode of persistent  lightheadedness started today within 24 hours of last HD. Seems hydrated on exam but could be mild dehydration. No reduced PO had good breakfast today. No other red flag symptoms. Not on any insulin or sulfonylurea. - Check POC CBG today at 35, given dextrose gel 37.5g x 1 dose, then re-check at [facility], however patient without any worsening or improvement in symptoms. Re-check again with venipuncture sample CBG 65, then 15 min at 75. Ultimately discussed with attending Dr Ardelia Mems, we advised that patient should go to ED, he declined transport via EMS, requested to go by private vehicle. - ED triage notified by Dr Ardelia Mems that they will be arriving. Need further blood work given symptoms following HD and closer monitoring CBG in ED setting given significant  hypoglycemia  Acute Left Trapezius Muscle spasm / strain with Left shoulder pain No clear etiology of injury. No known inciting injury. Does have some chronic back and neck pain, likely underlying OA. Recent ED visit without imaging, no recent C-spine or shoulder x-rays. No neurological deficits or radicular symptoms on Left side. - No conservative therapy at home  Plan: 1. Start trial on muscle relaxant with Baclofen 10mg , can take half to whole pill at night PRN, limited options due to ESRD cannot rx NSAIDs 2. Recommend regular use heating pad / moist heat, stretching, avoid heavy lifting / repetitive activities, Tylenol PRN, demonstrated shoulder exercises with pendulum circles and using fingers to walk up wall to stay active, counseled on avoiding frozen shoulder 3. RTC 2-4 weeks for re-evaluation if not improved, can follow-up with [facility], otherwise consider C-spine x-rays, PT   Follow up plan: Return in about 4 weeks (around 02/03/2016), or if symptoms worsen or fail to improve, for left neck, shoulder pain, dizziness.  Nobie Putnam, [place], PGY-3

## 2016-01-06 NOTE — ED Notes (Signed)
CBG 79 

## 2016-01-06 NOTE — Discharge Instructions (Signed)

## 2016-01-06 NOTE — Patient Instructions (Addendum)
Thank you for coming in to clinic today.  1. You may have been slightly dehydrated earlier this week contributing to some of your symptoms. - Especially after dialysis this can happen - Recommend to continue staying hydrated follow fluid limits given by kidney doctors - Be careful when standing up quickly, if dizzy wait a few moments before walking, to avoid falls - We will check blood sugar today, important to get testing supplies at home to check  2. For Neck / Shoulder pain - You have muscle spasms and tightness in your neck/back muscles - It is safe to take Tylenol Ext Str 500mg  tabs - take 1 to 2 (max dose 1000mg ) every 6 hours as needed for breakthrough pain, max 24 hour daily dose is 6 to 8 tablets or 4000mg  Start taking Baclofen (Lioresal) 10mg  (muscle relaxant) - start with half to a whole pill at night as needed, it may make you drowsy - Follow-up with Sports Medicine Dr Nori Riis - You can stop the Colchicine, unlikely gout - use heating pad regularly or ice if this helps  Remember to schedule your Flu Shot  Please schedule a follow-up appointment with Dr Sherril Cong in 4 weeks if neck / shoulder pain does not resolve  If you have any other questions or concerns, please feel free to call the clinic to contact me. You may also schedule an earlier appointment if necessary.  However, if your symptoms get significantly worse, please go to the Emergency Department to seek immediate medical attention.  Nobie Putnam, [place]

## 2016-01-06 NOTE — ED Notes (Signed)
CBG 73 

## 2016-01-06 NOTE — ED Notes (Signed)
Spoke with Dr. Alvino Chapel about patient starting to trend down again with CBGs.   Patient given sandwich and drink, okayed by Dr. Alvino Chapel.  Labs ordered.

## 2016-01-15 ENCOUNTER — Ambulatory Visit (INDEPENDENT_AMBULATORY_CARE_PROVIDER_SITE_OTHER): Payer: Medicare Other | Admitting: Family Medicine

## 2016-01-15 ENCOUNTER — Encounter: Payer: Self-pay | Admitting: Family Medicine

## 2016-01-15 VITALS — BP 154/69 | HR 58 | Ht 71.0 in | Wt 248.0 lb

## 2016-01-15 DIAGNOSIS — M79645 Pain in left finger(s): Secondary | ICD-10-CM | POA: Diagnosis not present

## 2016-01-15 NOTE — Progress Notes (Signed)
  ASHBY KAPALA - 65 y.o. male MRN [id number]  Date of birth: [birth year] JERVONTE KRAUTH is a 65 y.o. male who presents today for L thumb pain.  L thumb pain - Ongoing now for several months, located base of thumb that is affected by multiple motions.  Denies injury and is R hand dominant.  No swelling or erythema in the area.  Waxes and wanes with pain and he has not tried anything for this.  No previous x-ray or imaging to the area.  Does have trouble with gripping objects lately.  Denies paresthesias into his hand at this time.  No recent changes with AV fistula on L forearm.   PMHx - Updated and reviewed.  Contributory factors include: HTN, CHF, gout, CKD on dialysis  PSHx - Updated and reviewed.  Contributory factors include: AV fistula L arm  FHx - Updated and reviewed.  Medications - Updated and reviewed    ROS Per HPI.  12 point negative other than per HPI.   Exam:  Filed Vitals:   01/15/16 0953  BP: 154/69  Pulse: 58   Gen: NAD, AAO 3 Cardiorespiratory - Normal respiratory effort/rate.  RRR Skin: No rashes or erythema Extremities: No edema, pulses +2 bilateral upper and lower extremity Hand: Slight z'ing deformity of L hand with TTP at the [facility] joint.  No effusion or edema.  + incision forearm with AV fistula that has bruit but no evidence of infection.  ROM of CMC jt limited 2/2 pain.  Neurovascular intact w/ negative Finklestein and Phalen test.

## 2016-01-15 NOTE — Assessment & Plan Note (Signed)
Suspect this is CMC OA as he has pain directly to palpation and with motion - 3 view thumb including CMC view to evaluate - Topical medications and tylenol - Consider injection which was discussed in depth today.

## 2016-02-15 ENCOUNTER — Encounter: Payer: Self-pay | Admitting: Sports Medicine

## 2016-02-15 ENCOUNTER — Ambulatory Visit (INDEPENDENT_AMBULATORY_CARE_PROVIDER_SITE_OTHER): Payer: Medicare Other | Admitting: Sports Medicine

## 2016-02-15 DIAGNOSIS — Z992 Dependence on renal dialysis: Secondary | ICD-10-CM

## 2016-02-15 DIAGNOSIS — B351 Tinea unguium: Secondary | ICD-10-CM

## 2016-02-15 DIAGNOSIS — I890 Lymphedema, not elsewhere classified: Secondary | ICD-10-CM

## 2016-02-15 DIAGNOSIS — E1122 Type 2 diabetes mellitus with diabetic chronic kidney disease: Secondary | ICD-10-CM

## 2016-02-15 DIAGNOSIS — L853 Xerosis cutis: Secondary | ICD-10-CM

## 2016-02-15 DIAGNOSIS — M79671 Pain in right foot: Secondary | ICD-10-CM

## 2016-02-15 DIAGNOSIS — N186 End stage renal disease: Secondary | ICD-10-CM

## 2016-02-15 DIAGNOSIS — M79672 Pain in left foot: Secondary | ICD-10-CM | POA: Diagnosis not present

## 2016-02-15 NOTE — Patient Instructions (Signed)
Diabetes and Foot Care Diabetes may cause you to have problems because of poor blood supply (circulation) to your feet and legs. This may cause the skin on your feet to become thinner, break easier, and heal more slowly. Your skin may become dry, and the skin may peel and crack. You may also have nerve damage in your legs and feet causing decreased feeling in them. You may not notice minor injuries to your feet that could lead to infections or more serious problems. Taking care of your feet is one of the most important things you can do for yourself.  HOME CARE INSTRUCTIONS  Wear shoes at all times, even in the house. Do not go barefoot. Bare feet are easily injured.  Check your feet daily for blisters, cuts, and redness. If you cannot see the bottom of your feet, use a mirror or ask someone for help.  Wash your feet with warm water (do not use hot water) and mild soap. Then pat your feet and the areas between your toes until they are completely dry. Do not soak your feet as this can dry your skin.  Apply a moisturizing lotion or petroleum jelly (that does not contain alcohol and is unscented) to the skin on your feet and to dry, brittle toenails. Do not apply lotion between your toes.  Trim your toenails straight across. Do not dig under them or around the cuticle. File the edges of your nails with an emery board or nail file.  Do not cut corns or calluses or try to remove them with medicine.  Wear clean socks or stockings every day. Make sure they are not too tight. Do not wear knee-high stockings since they may decrease blood flow to your legs.  Wear shoes that fit properly and have enough cushioning. To break in new shoes, wear them for just a few hours a day. This prevents you from injuring your feet. Always look in your shoes before you put them on to be sure there are no objects inside.  Do not cross your legs. This may decrease the blood flow to your feet.  If you find a minor scrape,  cut, or break in the skin on your feet, keep it and the skin around it clean and dry. These areas may be cleansed with mild soap and water. Do not cleanse the area with peroxide, alcohol, or iodine.  When you remove an adhesive bandage, be sure not to damage the skin around it.  If you have a wound, look at it several times a day to make sure it is healing.  Do not use heating pads or hot water bottles. They may burn your skin. If you have lost feeling in your feet or legs, you may not know it is happening until it is too late.  Make sure your health care provider performs a complete foot exam at least annually or more often if you have foot problems. Report any cuts, sores, or bruises to your health care provider immediately. SEEK MEDICAL CARE IF:   You have an injury that is not healing.  You have cuts or breaks in the skin.  You have an ingrown nail.  You notice redness on your legs or feet.  You feel burning or tingling in your legs or feet.  You have pain or cramps in your legs and feet.  Your legs or feet are numb.  Your feet always feel cold. SEEK IMMEDIATE MEDICAL CARE IF:   There is increasing redness,   swelling, or pain in or around a wound.  There is a red line that goes up your leg.  Pus is coming from a wound.  You develop a fever or as directed by your health care provider.  You notice a bad smell coming from an ulcer or wound.   This information is not intended to replace advice given to you by your health care provider. Make sure you discuss any questions you have with your health care provider.   Document Released: 11/25/2000 Document Revised: 07/31/2013 Document Reviewed: 05/07/2013 [employer]vier Interactive Patient Education 2016 [employer]  

## 2016-02-15 NOTE — Progress Notes (Signed)
Patient ID: [name], male   DOB: 10-09-1951, 65 y.o.   MRN: [id number]  Subjective: [name] is a 65 y.o. male patient with history of type 2 diabetes who presents to office today complaining of long, painful nails  while ambulating in shoes; unable to trim. Patient states that the glucose reading this morning was not recorded; admits that he is not on meds right now for DM. Admits to HD 3x/wk. Patient denies any new changes in medication or new problems. Patient denies any new cramping, numbness, burning or tingling in the legs.  Patient goes to wound care center for legs; being treated for lymphedema and ulceration with compression wraps to both legs.  Patient Active Problem List   Diagnosis Date Noted  . Pain of left thumb 12/30/2015  . Venous stasis ulcers of both lower extremities 08/07/2015  . Varicose veins of lower extremities with complications 96/29/5284  . Acute gouty arthritis 06/28/2015  . Malfunction of arteriovenous dialysis fistula ([place])   . SOB (shortness of breath)   . CHF (congestive heart failure) ([place]) 06/23/2015  . Lymphedema distichiasis syndrome with kidney disease and diabetes mellitus ([place]) 04/06/2015  . Lymphedema of lower extremity 04/04/2015  . DM (diabetes mellitus), type 2 with renal complications ([place]) 13/24/4010  . MGUS (monoclonal gammopathy of unknown significance) 06/12/2014  . Special screening for malignant neoplasms, colon 07/09/2013  . Proliferative diabetic retinopathy ([place]) 10/23/2012  . Knee pain 08/21/2012  . Mass of thigh 08/21/2012  . ORGANIC IMPOTENCE 12/02/2009  . ONYCHOMYCOSIS 07/17/2009  . ESRD (end stage renal disease) on dialysis ([place]) 06/09/2008  . Hyperlipidemia LDL goal < 100 03/19/2007  . OBESITY, MORBID 02/08/2007  . HYPERTENSION, BENIGN SYSTEMIC 02/08/2007  . Atherosclerosis of native arteries of extremity with intermittent claudication ([place]) 02/08/2007   Current Outpatient Prescriptions on File Prior to Visit   Medication Sig Dispense Refill  . amLODipine (NORVASC) 10 MG tablet Take 10 mg by mouth every morning.    [name] aspirin EC 81 MG tablet Take 81 mg by mouth every morning.    [name] atorvastatin (LIPITOR) 40 MG tablet Take 1 tablet (40 mg total) by mouth daily at 6 PM. (Patient not taking: Reported on 08/07/2015) 30 tablet 11  . baclofen (LIORESAL) 10 MG tablet Take 0.5-1 tablets (5-10 mg total) by mouth at bedtime as needed for muscle spasms. 30 each 0  . calcium acetate (PHOSLO) 667 MG capsule 1,334-2,668 mg.     . carvedilol (COREG) 25 MG tablet Take 1 tablet (25 mg total) by mouth 2 (two) times daily with a meal. 60 tablet 11  . colchicine 0.6 MG tablet Take 0.5 tablets (0.3 mg total) by mouth daily. (Patient taking differently: Take 0.6-1.2 mg by mouth daily as needed (gout flare). ) 30 tablet 0  . darbepoetin (ARANESP) 100 MCG/0.5ML SOLN injection Inject 100 mcg into the skin every 14 (fourteen) days. [facility].    . furosemide (LASIX) 80 MG tablet Take 80 mg by mouth 2 (two) times daily.    . hydrALAZINE (APRESOLINE) 100 MG tablet Take 1 tablet (100 mg total) by mouth 2 (two) times daily. (Patient not taking: Reported on 01/06/2016) 180 tablet 3  . sodium bicarbonate 650 MG tablet Take 1 tablet (650 mg total) by mouth 3 (three) times daily. 270 tablet 3   No current facility-administered medications on file prior to visit.   Allergies  Allergen Reactions  . Shrimp [Shellfish Allergy] Swelling  . Iodine Swelling    06/29/2015:  Patient stated IV contrast dye and iodine on skin has not caused any reaction.    Recent Results (from the past 2160 hour(s))  HgB A1c     Status: None   Collection Time: 12/30/15 10:31 AM  Result Value Ref Range   Hemoglobin A1C 5.7   Glucose, capillary     Status: Abnormal   Collection Time: 01/06/16 11:28 AM  Result Value Ref Range   Glucose-Capillary 35 (LL) 65 - 99 mg/dL   Comment 1 Call MD NNP PA CNM   Glucose, capillary     Status: Abnormal   Collection  Time: 01/06/16 11:46 AM  Result Value Ref Range   Glucose-Capillary 25 (LL) 65 - 99 mg/dL   Comment 1 Call MD NNP PA CNM   Glucose, capillary     Status: None   Collection Time: 01/06/16 12:02 PM  Result Value Ref Range   Glucose-Capillary 65 65 - 99 mg/dL  Glucose, capillary     Status: None   Collection Time: 01/06/16 12:21 PM  Result Value Ref Range   Glucose-Capillary 74 65 - 99 mg/dL  CBG monitoring, ED     Status: Abnormal   Collection Time: 01/06/16  1:07 PM  Result Value Ref Range   Glucose-Capillary 49 (L) 65 - 99 mg/dL  CBG monitoring, ED     Status: None   Collection Time: 01/06/16  1:47 PM  Result Value Ref Range   Glucose-Capillary 73 65 - 99 mg/dL  CBG monitoring, ED     Status: None   Collection Time: 01/06/16  2:13 PM  Result Value Ref Range   Glucose-Capillary 79 65 - 99 mg/dL  CBG monitoring, ED     Status: None   Collection Time: 01/06/16  3:04 PM  Result Value Ref Range   Glucose-Capillary 74 65 - 99 mg/dL  CBG monitoring, ED     Status: None   Collection Time: 01/06/16  3:32 PM  Result Value Ref Range   Glucose-Capillary 97 65 - 99 mg/dL  CBC     Status: Abnormal   Collection Time: 01/06/16  3:38 PM  Result Value Ref Range   WBC 9.9 4.0 - 10.5 K/uL   RBC 3.81 (L) 4.22 - 5.81 MIL/uL   Hemoglobin 11.9 (L) 13.0 - 17.0 g/dL   HCT 37.2 (L) 39.0 - 52.0 %   MCV 97.6 78.0 - 100.0 fL   MCH 31.2 26.0 - 34.0 pg   MCHC 32.0 30.0 - 36.0 g/dL   RDW 17.4 (H) 11.5 - 15.5 %   Platelets 154 150 - 400 K/uL  Comprehensive metabolic panel     Status: Abnormal   Collection Time: 01/06/16  3:38 PM  Result Value Ref Range   Sodium 136 135 - 145 mmol/L   Potassium 5.0 3.5 - 5.1 mmol/L   Chloride 93 (L) 101 - 111 mmol/L   CO2 29 22 - 32 mmol/L   Glucose, Bld 101 (H) 65 - 99 mg/dL   BUN 31 (H) 6 - 20 mg/dL   Creatinine, Ser 8.84 (H) 0.61 - 1.24 mg/dL   Calcium 9.4 8.9 - 10.3 mg/dL   Total Protein 8.4 (H) 6.5 - 8.1 g/dL   Albumin 3.5 3.5 - 5.0 g/dL   AST 14 (L) 15 -  41 U/L   ALT 11 (L) 17 - 63 U/L   Alkaline Phosphatase 62 38 - 126 U/L   Total Bilirubin 0.5 0.3 - 1.2 mg/dL   GFR calc non Af Amer 5 (L) >  60 mL/min   GFR calc Af Amer 6 (L) >60 mL/min    Comment: (NOTE) The eGFR has been calculated using the CKD EPI equation. This calculation has not been validated in all clinical situations. eGFR's persistently <60 mL/min signify possible Chronic Kidney Disease.    Anion gap 14 5 - 15  I-Stat Chem 8, ED     Status: Abnormal   Collection Time: 01/06/16  3:42 PM  Result Value Ref Range   Sodium 135 135 - 145 mmol/L   Potassium 5.0 3.5 - 5.1 mmol/L   Chloride 96 (L) 101 - 111 mmol/L   BUN 39 (H) 6 - 20 mg/dL   Creatinine, Ser 8.50 (H) 0.61 - 1.24 mg/dL   Glucose, Bld 97 65 - 99 mg/dL   Calcium, Ion 1.07 (L) 1.13 - 1.30 mmol/L   TCO2 30 0 - 100 mmol/L   Hemoglobin 13.9 13.0 - 17.0 g/dL   HCT 41.0 39.0 - 52.0 %  CBG monitoring, ED     Status: Abnormal   Collection Time: 01/06/16  6:26 PM  Result Value Ref Range   Glucose-Capillary 127 (H) 65 - 99 mg/dL    Objective: General: Patient is awake, alert, and oriented x 3 and in no acute distress.  Integument: Skin is warm, extremely dry and non-supple bilateral with trophic chronic edema changes and plaques of hyperpigmented and keratotic skin. Nails are tender, long, thickened and dystrophic with subungual debris, consistent with onychomycosis, 1-4 bilateral. No signs of infection. No open lesions or preulcerative lesions present bilateral, diffuse callus sub met 1 with no signs of infection. Compression wraps present to legs above the ankle; patient being treated by wound care center. Remaining integument unremarkable.  Vasculature:  Dorsalis Pedis pulse 1/4 bilateral. Posterior Tibial pulse  0/4 bilateral.  Capillary fill time <3 sec 1-4 bilateral. No hair growth to the level of the digits. Temperature gradient within normal limits. No varicosities present bilateral. Lymphedema present bilateral.    Neurology: The patient has diminished sensation measured with a 5.07/10g Semmes Weinstein Monofilament at all pedal sites bilateral . Vibratory sensation diminished bilateral with tuning fork. No Babinski sign present bilateral.   Musculoskeletal: 5th toe amputation status noted bilateral. Muscular strength 5/5 in all lower extremity muscular groups bilateral without pain on range of motion . No tenderness with calf compression bilateral.  Assessment and Plan: Problem List Items Addressed This Visit      Musculoskeletal and Integument   ONYCHOMYCOSIS - Primary     Other   Lymphedema of lower extremity    Other Visit Diagnoses    Foot pain, bilateral        Xerosis of skin        Type 2 diabetes mellitus with chronic kidney disease on chronic dialysis, without long-term current use of insulin ([place])          -Examined patient. -Discussed and educated patient on diabetic foot care, especially with  regards to the vascular, neurological and musculoskeletal systems.  -Stressed the importance of good glycemic control and the detriment of not  controlling glucose levels in relation to the foot. -Mechanically debrided all nails 1-4 bilateral using sterile nail nipper and filed with dremel without incident  -Continue with Bag Balm to feet daily  -[employer] diabetic shoe order form was completed; office to contact primary care for approval / certification;  Office to arrange shoe fitting and dispensing. -Answered all patient questions -Patient to return in 3 months for at risk foot care.  Patient to continue with follow up at wound care center for lymphedema/ulcer management -Patient advised to call the office if any problems or questions arise in the  Meantime.  Landis Martins, DPM

## 2016-04-13 ENCOUNTER — Ambulatory Visit: Payer: Medicare Other | Admitting: *Deleted

## 2016-04-13 VITALS — Ht 71.0 in | Wt 248.0 lb

## 2016-04-13 DIAGNOSIS — Z992 Dependence on renal dialysis: Principal | ICD-10-CM

## 2016-04-13 DIAGNOSIS — E1122 Type 2 diabetes mellitus with diabetic chronic kidney disease: Secondary | ICD-10-CM

## 2016-04-13 DIAGNOSIS — N186 End stage renal disease: Principal | ICD-10-CM

## 2016-04-13 NOTE — Progress Notes (Signed)
Patient ID: [name], male   DOB: 28-Dec-1950, 65 y.o.   MRN: [id number] Patient presents to be scanned and measured for diabetic shoes and inserts.

## 2016-05-13 ENCOUNTER — Telehealth: Payer: Self-pay | Admitting: Family Medicine

## 2016-05-13 NOTE — Telephone Encounter (Signed)
Pt dropped off his Handicap place card to be filled out by his doctor. He was also wondering if he can get a permanent one. Please call patient when ready to pick up. Form put in the Red team folder at the front desk. jw

## 2016-05-13 NOTE — Telephone Encounter (Signed)
Placed in MDs box. Deseree Blount, CMA  

## 2016-05-16 ENCOUNTER — Ambulatory Visit (INDEPENDENT_AMBULATORY_CARE_PROVIDER_SITE_OTHER): Payer: Medicare Other | Admitting: Sports Medicine

## 2016-05-16 ENCOUNTER — Encounter: Payer: Self-pay | Admitting: Sports Medicine

## 2016-05-16 DIAGNOSIS — L84 Corns and callosities: Secondary | ICD-10-CM

## 2016-05-16 DIAGNOSIS — M79671 Pain in right foot: Secondary | ICD-10-CM

## 2016-05-16 DIAGNOSIS — I890 Lymphedema, not elsewhere classified: Secondary | ICD-10-CM

## 2016-05-16 DIAGNOSIS — M79672 Pain in left foot: Secondary | ICD-10-CM

## 2016-05-16 DIAGNOSIS — Z992 Dependence on renal dialysis: Secondary | ICD-10-CM

## 2016-05-16 DIAGNOSIS — E1122 Type 2 diabetes mellitus with diabetic chronic kidney disease: Secondary | ICD-10-CM

## 2016-05-16 DIAGNOSIS — L853 Xerosis cutis: Secondary | ICD-10-CM

## 2016-05-16 DIAGNOSIS — B351 Tinea unguium: Secondary | ICD-10-CM

## 2016-05-16 DIAGNOSIS — N186 End stage renal disease: Secondary | ICD-10-CM

## 2016-05-16 NOTE — Patient Instructions (Signed)

## 2016-05-16 NOTE — Progress Notes (Signed)
Patient ID: [name], male   DOB: [birth year], 65 y.o.   MRN: [id number]  Subjective: [name] is a 65 y.o. male patient with history of type 2 diabetes who presents to office today  For Diabetic Shoes to Pick them up and complaining of long, painful nails  while ambulating in shoes; unable to trim. FBS not recorded. On HD 3x/wk. Patient denies any new changes in medication or new problems. Patient denies any new cramping, numbness, burning or tingling in the legs.  Patient goes to wound care center for legs; being treated for lymphedema and ulceration with compression wraps to both legs.  Patient Active Problem List   Diagnosis Date Noted  . Pain of left thumb 12/30/2015  . Venous stasis ulcers of both lower extremities 08/07/2015  . Varicose veins of lower extremities with complications 46/50/3546  . Acute gouty arthritis 06/28/2015  . Malfunction of arteriovenous dialysis fistula ([place])   . SOB (shortness of breath)   . CHF (congestive heart failure) ([place]) 06/23/2015  . Lymphedema distichiasis syndrome with kidney disease and diabetes mellitus ([place]) 04/06/2015  . Lymphedema of lower extremity 04/04/2015  . DM (diabetes mellitus), type 2 with renal complications ([place]) 56/81/2751  . MGUS (monoclonal gammopathy of unknown significance) 06/12/2014  . Special screening for malignant neoplasms, colon 07/09/2013  . Proliferative diabetic retinopathy ([place]) 10/23/2012  . Knee pain 08/21/2012  . Mass of thigh 08/21/2012  . ORGANIC IMPOTENCE 12/02/2009  . ONYCHOMYCOSIS 07/17/2009  . ESRD (end stage renal disease) on dialysis ([place]) 06/09/2008  . Hyperlipidemia LDL goal < 100 03/19/2007  . OBESITY, MORBID 02/08/2007  . HYPERTENSION, BENIGN SYSTEMIC 02/08/2007  . Atherosclerosis of native arteries of extremity with intermittent claudication ([place]) 02/08/2007   Current Outpatient Prescriptions on File Prior to Visit  Medication Sig Dispense Refill  . amLODipine (NORVASC) 10 MG tablet  Take 10 mg by mouth every morning.    [name] aspirin EC 81 MG tablet Take 81 mg by mouth every morning.    [name] atorvastatin (LIPITOR) 40 MG tablet Take 1 tablet (40 mg total) by mouth daily at 6 PM. (Patient not taking: Reported on 08/07/2015) 30 tablet 11  . baclofen (LIORESAL) 10 MG tablet Take 0.5-1 tablets (5-10 mg total) by mouth at bedtime as needed for muscle spasms. 30 each 0  . calcium acetate (PHOSLO) 667 MG capsule 1,334-2,668 mg.     . carvedilol (COREG) 25 MG tablet Take 1 tablet (25 mg total) by mouth 2 (two) times daily with a meal. 60 tablet 11  . colchicine 0.6 MG tablet Take 0.5 tablets (0.3 mg total) by mouth daily. (Patient taking differently: Take 0.6-1.2 mg by mouth daily as needed (gout flare). ) 30 tablet 0  . darbepoetin (ARANESP) 100 MCG/0.5ML SOLN injection Inject 100 mcg into the skin every 14 (fourteen) days. [facility].    . furosemide (LASIX) 80 MG tablet Take 80 mg by mouth 2 (two) times daily.    . hydrALAZINE (APRESOLINE) 100 MG tablet Take 1 tablet (100 mg total) by mouth 2 (two) times daily. (Patient not taking: Reported on 01/06/2016) 180 tablet 3  . sodium bicarbonate 650 MG tablet Take 1 tablet (650 mg total) by mouth 3 (three) times daily. 270 tablet 3   No current facility-administered medications on file prior to visit.   Allergies  Allergen Reactions  . Shrimp [Shellfish Allergy] Swelling  . Iodine Swelling    06/29/2015: Patient stated IV contrast dye and iodine on skin has not  caused any reaction.    No results found for this or any previous visit (from the past 2160 hour(s)).  Objective: General: Patient is awake, alert, and oriented x 3 and in no acute distress.  Integument: Skin is warm, extremely dry and non-supple bilateral with trophic chronic edema changes and plaques of hyperpigmented and keratotic skin. Nails are tender, long, thickened and dystrophic with subungual debris, consistent with onychomycosis, 1-4 bilateral. No signs of infection.  No open lesions or preulcerative lesions present bilateral, diffuse callus sub met 1 with no signs of infection. Compression wraps present to legs above the ankle; patient being treated by wound care center. Remaining integument unremarkable.  Vasculature:  Dorsalis Pedis pulse 1/4 bilateral. Posterior Tibial pulse  0/4 bilateral.  Capillary fill time <3 sec 1-4 bilateral. No hair growth to the level of the digits. Temperature gradient within normal limits. No varicosities present bilateral. Lymphedema present bilateral.   Neurology: The patient has diminished sensation measured with a 5.07/10g Semmes Weinstein Monofilament at all pedal sites bilateral . Vibratory sensation diminished bilateral with tuning fork. No Babinski sign present bilateral.   Musculoskeletal: 5th toe amputation status noted bilateral. Muscular strength 5/5 in all lower extremity muscular groups bilateral without pain on range of motion . No tenderness with calf compression bilateral.  Assessment and Plan: Problem List Items Addressed This Visit      Endocrine   DM (diabetes mellitus), type 2 with renal complications ([place]) - Primary     Musculoskeletal and Integument   ONYCHOMYCOSIS     Other   Lymphedema of lower extremity    Other Visit Diagnoses    Foot pain, bilateral        Xerosis of skin        Pre-ulcerative calluses          -Examined patient. -Discussed and educated patient on diabetic foot care, especially with  regards to the vascular, neurological and musculoskeletal systems.  -Stressed the importance of good glycemic control and the detriment of not  controlling glucose levels in relation to the foot. -Mechanically debrided all nails 1-4 bilateral using sterile nail nipper and filed with dremel without incident  -Continue with Bag Balm or Okeeffee to feet daily  -Diabetic Shoes are tried on for good fit.  Patient received 1 Pair Apex [id number] [place] wingtip oxford in men's 12.5 extra wide and 3  pairs custom molded diabetic inserts.  Verbal and written break in and wear instructions given. Fitting performed by assistant  -Answered all patient questions -Patient to return in 3 months for at risk foot care. Patient to continue with follow up at wound care center for lymphedema/ulcer management -Patient advised to call the office if any problems or questions arise in the  Meantime.  Landis Martins, DPM

## 2016-05-18 NOTE — Telephone Encounter (Signed)
Patient informed. 

## 2016-05-18 NOTE — Telephone Encounter (Signed)
Form completed and placed up front for pick-up. This form does not include the permanent option but I would be happy to approve him permanently if he gets the correct form from the [facility]. Thanks!

## 2016-06-15 ENCOUNTER — Other Ambulatory Visit: Payer: Self-pay | Admitting: *Deleted

## 2016-06-15 MED ORDER — FUROSEMIDE 80 MG PO TABS: 80.0000 mg | ORAL_TABLET | Freq: Two times a day (BID) | ORAL | Status: DC

## 2016-08-22 ENCOUNTER — Ambulatory Visit: Payer: Medicare Other | Admitting: Sports Medicine

## 2016-08-23 ENCOUNTER — Ambulatory Visit (INDEPENDENT_AMBULATORY_CARE_PROVIDER_SITE_OTHER): Payer: Medicare Other | Admitting: Sports Medicine

## 2016-08-23 ENCOUNTER — Encounter: Payer: Self-pay | Admitting: Sports Medicine

## 2016-08-23 VITALS — Ht 71.0 in | Wt 255.0 lb

## 2016-08-23 DIAGNOSIS — B351 Tinea unguium: Secondary | ICD-10-CM

## 2016-08-23 DIAGNOSIS — N186 End stage renal disease: Secondary | ICD-10-CM

## 2016-08-23 DIAGNOSIS — L853 Xerosis cutis: Secondary | ICD-10-CM

## 2016-08-23 DIAGNOSIS — E1122 Type 2 diabetes mellitus with diabetic chronic kidney disease: Secondary | ICD-10-CM

## 2016-08-23 DIAGNOSIS — M79672 Pain in left foot: Secondary | ICD-10-CM

## 2016-08-23 DIAGNOSIS — L84 Corns and callosities: Secondary | ICD-10-CM

## 2016-08-23 DIAGNOSIS — M79671 Pain in right foot: Secondary | ICD-10-CM | POA: Diagnosis not present

## 2016-08-23 DIAGNOSIS — Z992 Dependence on renal dialysis: Secondary | ICD-10-CM

## 2016-08-23 DIAGNOSIS — I890 Lymphedema, not elsewhere classified: Secondary | ICD-10-CM

## 2016-08-23 NOTE — Progress Notes (Signed)
Patient ID: [name], male   DOB: 1951-03-20, 65 y.o.   MRN: [id number]  Subjective: [name] is a 65 y.o. male patient with history of type 2 diabetes who returns to office today complaining of long, painful nails  while ambulating in shoes; unable to trim. FBS not recorded. On HD 3x/wk. Patient denies any new changes in medication or new problems. Patient denies any new cramping, numbness, burning or tingling in the legs.  Patient goes to wound care center for legs; being treated for lymphedema and ulceration with compression wraps to both legs. He changes dressings himself 2x weekly.   Patient Active Problem List   Diagnosis Date Noted  . Pain of left thumb 12/30/2015  . Venous stasis ulcers of both lower extremities 08/07/2015  . Varicose veins of lower extremities with complications 57/32/2025  . Acute gouty arthritis 06/28/2015  . Malfunction of arteriovenous dialysis fistula ([place])   . SOB (shortness of breath)   . CHF (congestive heart failure) ([place]) 06/23/2015  . Lymphedema distichiasis syndrome with kidney disease and diabetes mellitus ([place]) 04/06/2015  . Lymphedema of lower extremity 04/04/2015  . DM (diabetes mellitus), type 2 with renal complications ([place]) 42/70/6237  . MGUS (monoclonal gammopathy of unknown significance) 06/12/2014  . Special screening for malignant neoplasms, colon 07/09/2013  . Proliferative diabetic retinopathy ([employer]) 10/23/2012  . Knee pain 08/21/2012  . Mass of thigh 08/21/2012  . ORGANIC IMPOTENCE 12/02/2009  . ONYCHOMYCOSIS 07/17/2009  . ESRD (end stage renal disease) on dialysis ([place]) 06/09/2008  . Hyperlipidemia LDL goal < 100 03/19/2007  . OBESITY, MORBID 02/08/2007  . HYPERTENSION, BENIGN SYSTEMIC 02/08/2007  . Atherosclerosis of native arteries of extremity with intermittent claudication ([place]) 02/08/2007   Current Outpatient Prescriptions on File Prior to Visit  Medication Sig Dispense Refill  . amLODipine (NORVASC) 10 MG tablet  Take 10 mg by mouth every morning.    [name] aspirin EC 81 MG tablet Take 81 mg by mouth every morning.    [name] atorvastatin (LIPITOR) 40 MG tablet Take 1 tablet (40 mg total) by mouth daily at 6 PM. (Patient not taking: Reported on 08/07/2015) 30 tablet 11  . baclofen (LIORESAL) 10 MG tablet Take 0.5-1 tablets (5-10 mg total) by mouth at bedtime as needed for muscle spasms. 30 each 0  . calcium acetate (PHOSLO) 667 MG capsule 1,334-2,668 mg.     . carvedilol (COREG) 25 MG tablet Take 1 tablet (25 mg total) by mouth 2 (two) times daily with a meal. 60 tablet 11  . colchicine 0.6 MG tablet Take 0.5 tablets (0.3 mg total) by mouth daily. (Patient taking differently: Take 0.6-1.2 mg by mouth daily as needed (gout flare). ) 30 tablet 0  . darbepoetin (ARANESP) 100 MCG/0.5ML SOLN injection Inject 100 mcg into the skin every 14 (fourteen) days. [facility].    . furosemide (LASIX) 80 MG tablet Take 1 tablet (80 mg total) by mouth 2 (two) times daily. 60 tablet 2  . hydrALAZINE (APRESOLINE) 100 MG tablet Take 1 tablet (100 mg total) by mouth 2 (two) times daily. (Patient not taking: Reported on 01/06/2016) 180 tablet 3  . sodium bicarbonate 650 MG tablet Take 1 tablet (650 mg total) by mouth 3 (three) times daily. 270 tablet 3   No current facility-administered medications on file prior to visit.    Allergies  Allergen Reactions  . Shrimp [Shellfish Allergy] Swelling  . Iodine Swelling    06/29/2015: Patient stated IV contrast dye and iodine on  skin has not caused any reaction.    No results found for this or any previous visit (from the past 2160 hour(s)).  Objective: General: Patient is awake, alert, and oriented x 3 and in no acute distress.  Integument: Skin is warm, extremely dry and non-supple bilateral with trophic chronic edema changes and plaques of hyperpigmented and keratotic skin. Nails are tender, long, thickened and dystrophic with subungual debris, consistent with onychomycosis, 1-4  bilateral. No signs of infection. No open lesions or preulcerative lesions present bilateral, diffuse callus sub met 1 with no signs of infection. Compression wraps present to legs above the ankle; patient being treated by wound care center. Remaining integument unremarkable.  Vasculature:  Dorsalis Pedis pulse 1/4 bilateral. Posterior Tibial pulse  0/4 bilateral. Capillary fill time <3 sec 1-4 bilateral. No hair growth to the level of the digits.Temperature gradient within normal limits. No varicosities present bilateral. Lymphedema present bilateral.   Neurology: The patient has diminished sensation measured with a 5.07/10g Semmes Weinstein Monofilament at all pedal sites bilateral . Vibratory sensation diminished bilateral with tuning fork. No Babinski sign present bilateral.   Musculoskeletal: 5th toe amputation status noted bilateral. Muscular strength 5/5 in all lower extremity muscular groups bilateral without pain on range of motion. No tenderness with calf compression bilateral.  Assessment and Plan: Problem List Items Addressed This Visit      Musculoskeletal and Integument   ONYCHOMYCOSIS - Primary     Other   Lymphedema of lower extremity    Other Visit Diagnoses    Xerosis of skin       Type 2 diabetes mellitus with chronic kidney disease on chronic dialysis, without long-term current use of insulin ([facility])       Foot pain, bilateral       Pre-ulcerative calluses         -Examined patient. -Discussed and educated patient on diabetic foot care, especially with  regards to the vascular, neurological and musculoskeletal systems.  -Stressed the importance of good glycemic control and the detriment of not  controlling glucose levels in relation to the foot. -Mechanically debrided all nails 1-4 bilateral using sterile nail nipper and filed with dremel without incident  -Continue with Bag Balm or Okeeffee to feet daily  -Continue with Diabetic Shoes   -Answered all patient  questions -Patient to return in 3 months for at risk foot care. Patient to continue with follow up at wound care center for lymphedema/ulcer management -Patient advised to call the office if any problems or questions arise in the  Meantime.  Landis Martins, DPM

## 2016-10-19 ENCOUNTER — Encounter: Payer: Self-pay | Admitting: Vascular Surgery

## 2016-10-20 ENCOUNTER — Ambulatory Visit: Payer: Medicare Other | Admitting: Vascular Surgery

## 2016-10-20 ENCOUNTER — Encounter (HOSPITAL_COMMUNITY): Payer: Medicare Other

## 2016-10-20 ENCOUNTER — Other Ambulatory Visit (HOSPITAL_COMMUNITY): Payer: Medicare Other

## 2016-11-08 DIAGNOSIS — H59023 Cataract (lens) fragments in eye following cataract surgery, bilateral: Secondary | ICD-10-CM | POA: Insufficient documentation

## 2016-11-22 ENCOUNTER — Ambulatory Visit (INDEPENDENT_AMBULATORY_CARE_PROVIDER_SITE_OTHER): Payer: Medicare Other | Admitting: Podiatry

## 2016-11-22 ENCOUNTER — Encounter: Payer: Self-pay | Admitting: Podiatry

## 2016-11-22 DIAGNOSIS — Z992 Dependence on renal dialysis: Secondary | ICD-10-CM

## 2016-11-22 DIAGNOSIS — B351 Tinea unguium: Secondary | ICD-10-CM

## 2016-11-22 DIAGNOSIS — N186 End stage renal disease: Secondary | ICD-10-CM

## 2016-11-22 DIAGNOSIS — M79671 Pain in right foot: Secondary | ICD-10-CM

## 2016-11-22 DIAGNOSIS — M79672 Pain in left foot: Secondary | ICD-10-CM

## 2016-11-22 DIAGNOSIS — E1122 Type 2 diabetes mellitus with diabetic chronic kidney disease: Secondary | ICD-10-CM | POA: Diagnosis not present

## 2016-11-22 NOTE — Progress Notes (Signed)
Subjective: 65 y.o. returns the office today for painful, elongated, thickened toenails which he cannot trim himself. Denies any redness or drainage around the nails. He has a history of 5th toe amputations bilaterally. He has lymphedema. He denies any ulcer. He does wrap his legs daily and does not want the bandage off of his legs. Denies any acute changes since last appointment and no new complaints today. Denies any systemic complaints such as fevers, chills, nausea, vomiting.   Objective: AAO 3, NAD DP/PT pulses palpable, CRT less than 3 seconds Protective sensation decreased with Simms Weinstein monofilament Nails hypertrophic, dystrophic, elongated, brittle, discolored 8. There is tenderness overlying the nails 1-5 bilaterally. There is no surrounding erythema or drainage along the nail sites. No open lesions or pre-ulcerative lesions are identified. No other areas of tenderness bilateral lower extremities. No overlying edema, erythema, increased warmth. No pain with calf compression, swelling, warmth, erythema.  Assessment: Patient presents with symptomatic onychomycosis  Plan: -Treatment options including alternatives, risks, complications were discussed -Nails sharply debrided 8 without complication/bleeding. -Discussed daily foot inspection. If there are any changes, to call the office immediately.  -Follow-up in 3 months or sooner if any problems are to arise. In the meantime, encouraged to call the office with any questions, concerns, changes symptoms.  Celesta Gentile, DPM

## 2016-12-08 IMAGING — DX DG TIBIA/FIBULA 2V*L*
4 series · 4 of 4 positions shown · non-contrast
Comparison: None.

CLINICAL DATA: Lower extremity edema.  Leg swelling.

EXAM:
LEFT TIBIA AND FIBULA - 2 VIEW

[tibia ap (1 of 2)]
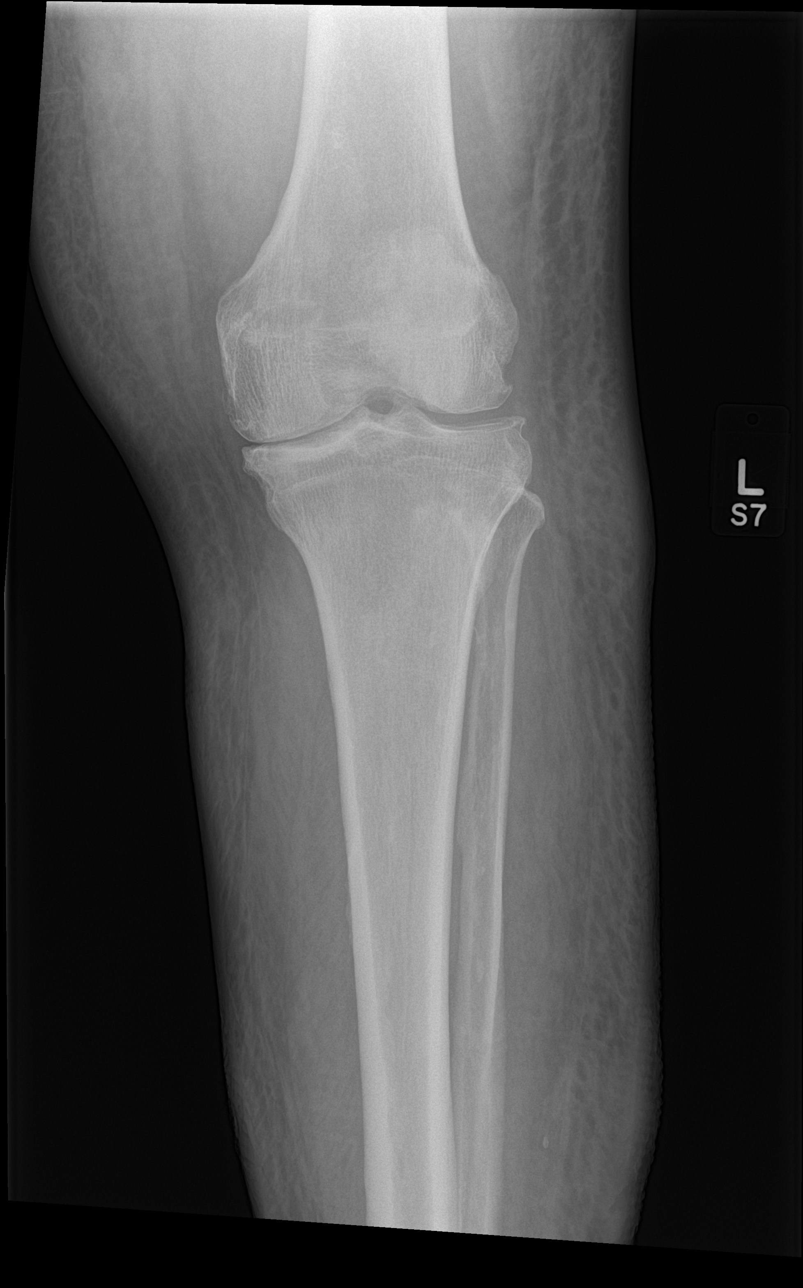

[tibia ap (2 of 2)]
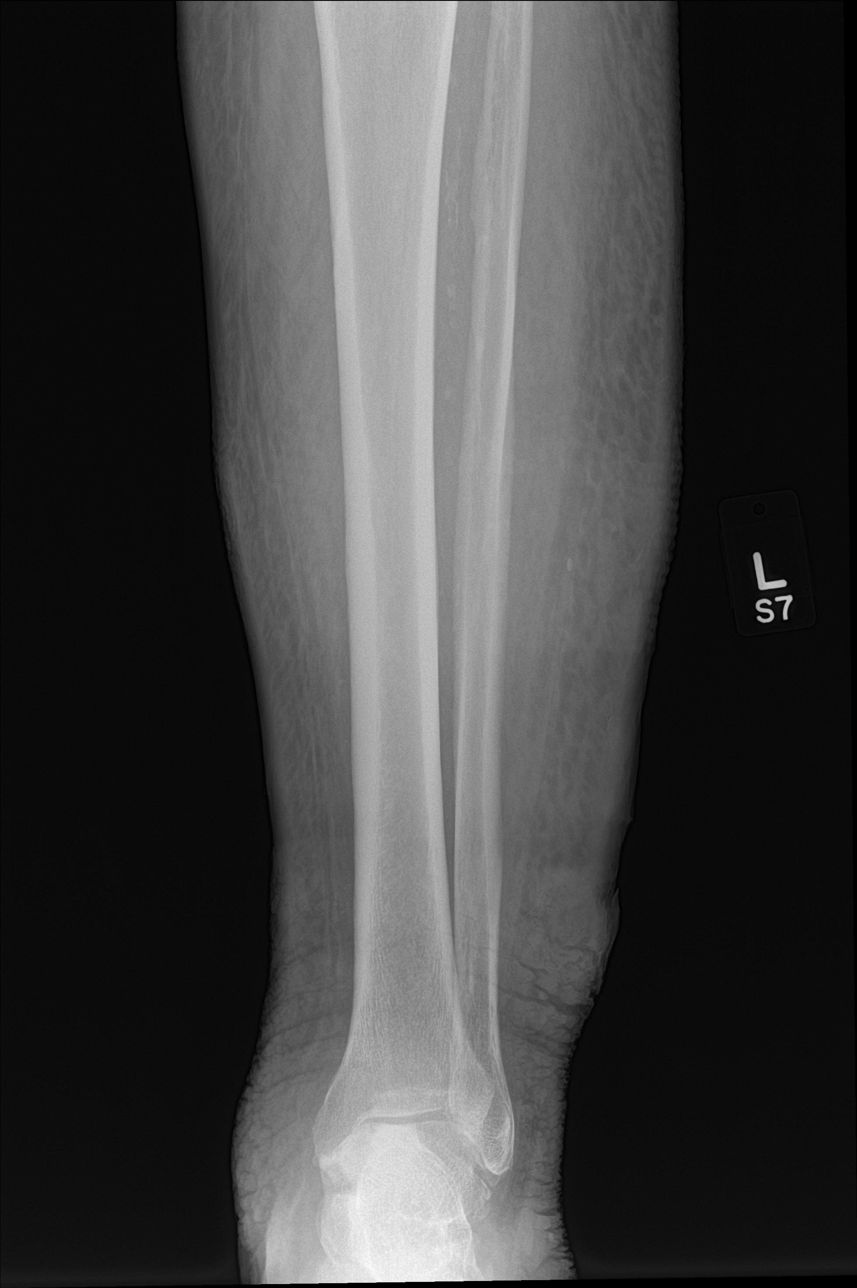

[tibia lat (1 of 2)]
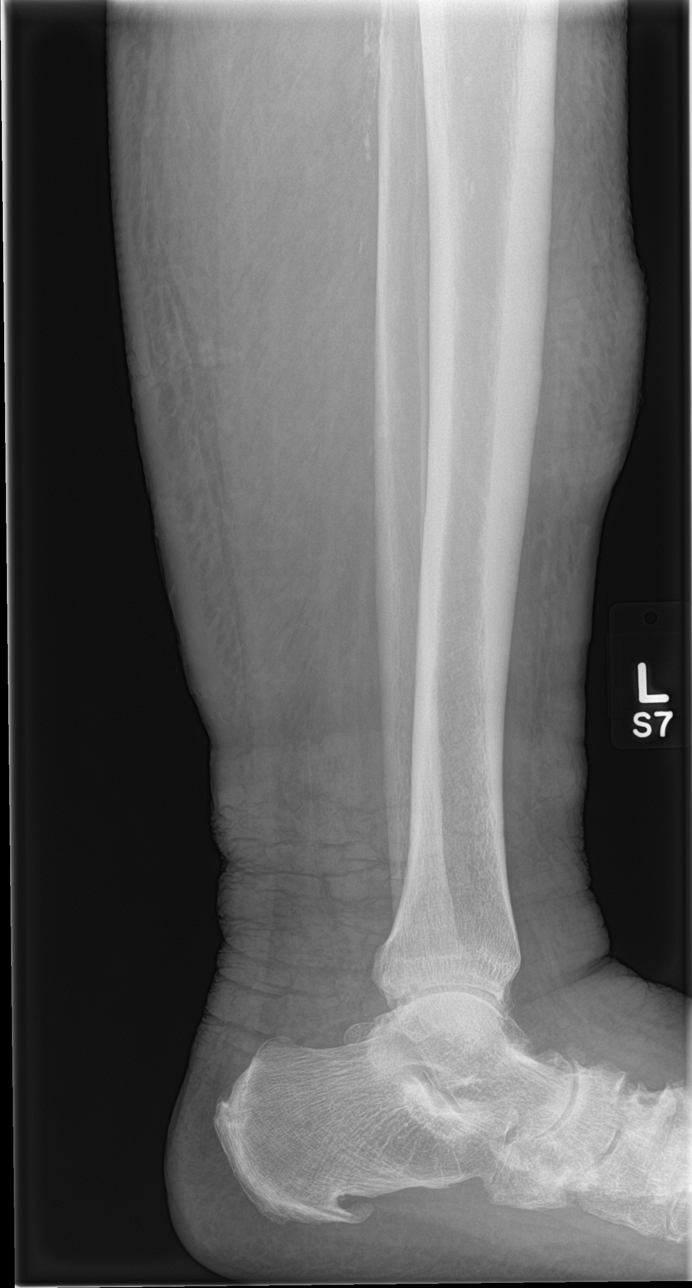

[tibia lat (2 of 2)]
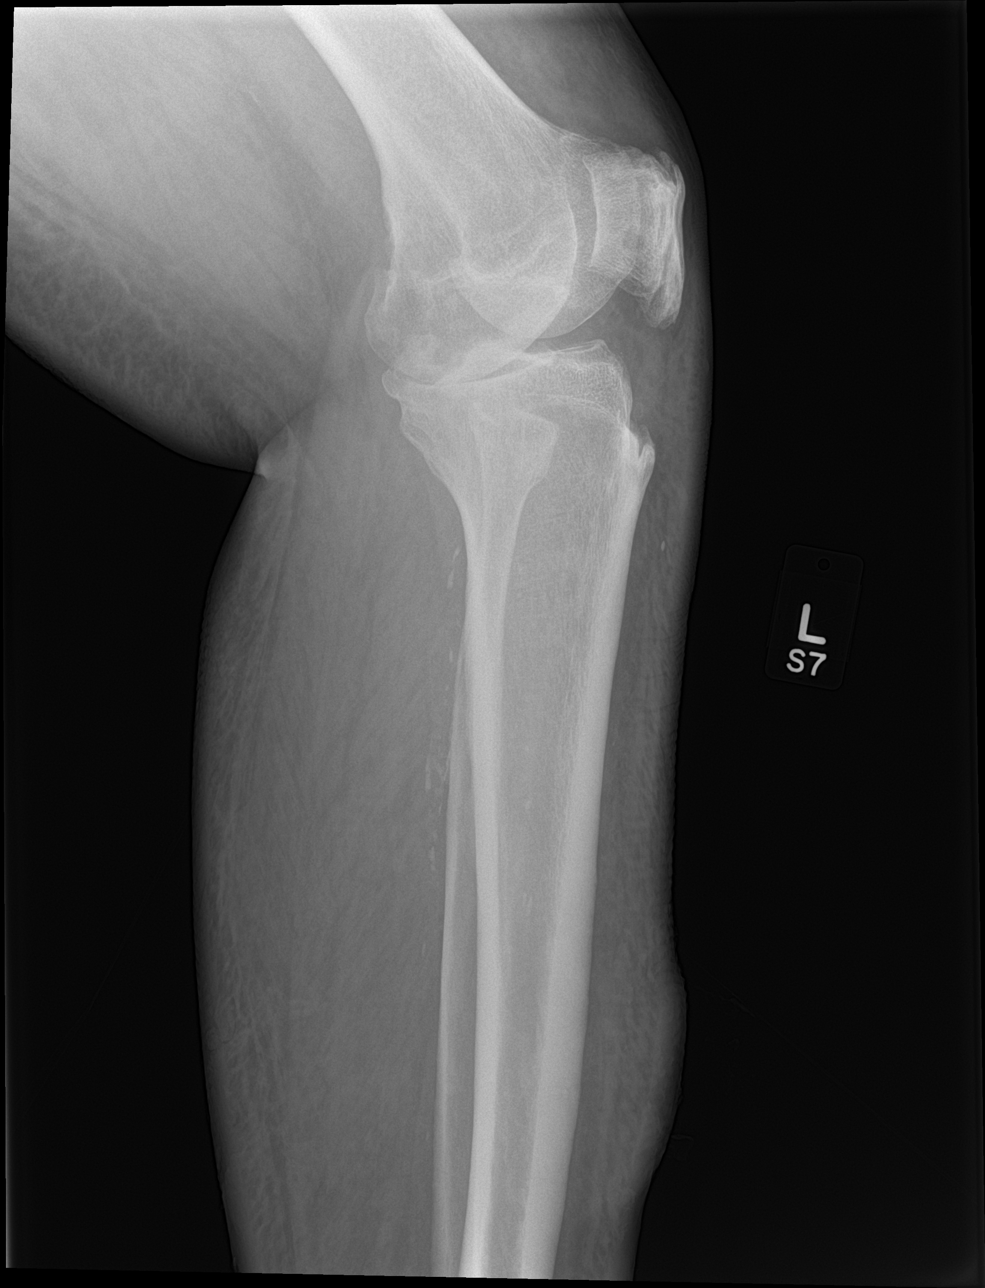

[4 of 4 positions shown; findings below may reference images not displayed]

FINDINGS: Marked joint space narrowing with osteophyte formations in the
medial knee compartment. Osteophytes in the lateral knee
compartment. Evidence for subcutaneous edema. Vascular
calcifications. Negative for fracture or dislocation. The left ankle
is located with degenerative changes. Large spur along the plantar
aspect of the calcaneus. Degenerative changes in the midfoot.
IMPRESSION: No acute bone abnormality in the left lower leg. Degenerative
changes at the knee, ankle and foot.

Large calcaneal spur.

## 2017-01-12 ENCOUNTER — Ambulatory Visit: Payer: Medicare Other | Admitting: Vascular Surgery

## 2017-01-12 ENCOUNTER — Other Ambulatory Visit (HOSPITAL_COMMUNITY): Payer: Medicare Other

## 2017-01-12 ENCOUNTER — Encounter (HOSPITAL_COMMUNITY): Payer: Medicare Other

## 2017-01-24 DIAGNOSIS — E875 Hyperkalemia: Secondary | ICD-10-CM | POA: Insufficient documentation

## 2017-02-17 ENCOUNTER — Encounter: Payer: Self-pay | Admitting: Vascular Surgery

## 2017-02-21 ENCOUNTER — Ambulatory Visit: Payer: Medicare Other | Admitting: Sports Medicine

## 2017-02-28 ENCOUNTER — Other Ambulatory Visit: Payer: Self-pay

## 2017-02-28 DIAGNOSIS — I739 Peripheral vascular disease, unspecified: Secondary | ICD-10-CM

## 2017-02-28 DIAGNOSIS — Z4889 Encounter for other specified surgical aftercare: Secondary | ICD-10-CM

## 2017-03-02 ENCOUNTER — Inpatient Hospital Stay (HOSPITAL_COMMUNITY): Admission: RE | Admit: 2017-03-02 | Payer: Medicare Other | Source: Ambulatory Visit

## 2017-03-02 ENCOUNTER — Ambulatory Visit: Payer: Medicare Other | Admitting: Vascular Surgery

## 2017-03-03 ENCOUNTER — Ambulatory Visit (INDEPENDENT_AMBULATORY_CARE_PROVIDER_SITE_OTHER): Payer: Medicare Other | Admitting: Family Medicine

## 2017-03-03 VITALS — BP 190/86 | HR 67 | Temp 97.6°F | Wt 265.0 lb

## 2017-03-03 DIAGNOSIS — R197 Diarrhea, unspecified: Secondary | ICD-10-CM | POA: Insufficient documentation

## 2017-03-03 NOTE — Patient Instructions (Addendum)
It was a pleasure seeing you today in our clinic. Today we discussed your diarrhea. Here is the treatment plan we have discussed and agreed upon together:   - Today we are obtaining some labs. I will mail you these sometime next week. - I would like for you to set up an appointment to be seen next week. If your symptoms continue or worsen and we will reevaluate you at that time with the new laboratory results. My hope is that at that point to your symptoms may resolve.  - If you notice any worsening symptoms, red/maroon stool, or develop any lightheadedness/dizziness I think it would be very important to have you evaluated immediately at the emergency department.

## 2017-03-03 NOTE — Progress Notes (Signed)
   HPI  CC: DIARRHEA ~3 weeks ago. A lot of gas. Having some "blow-out" stools. Very dark. "Possibly" black. No blood. No vomiting. Worse after meals.  Having diarrhea for 3 weeks Progression: the same as on it's onset Stools per day: ~3 Does diarrhea wake patient: no Medications tried: imodium Recent travel: no Sick contacts: no Ingested suspicious foods: no Antibiotics recently: no Immunocompromised: yes, on HD for ESRD  Symptoms Vomiting: no Abdominal pain: no Weight Loss: no Decreased urine output: N/A Lightheadedness: no Fever: no Bloody stools: no  ROS see HPI Smoking Status noted  Objective: BP (!) 190/86   Pulse 67   Temp 97.6 F (36.4 C) (Oral)   Wt 265 lb (120.2 kg)   BMI 36.96 kg/m  Gen: NAD, alert, cooperative, and pleasant. CV: RRR, no murmur Resp: CTAB, no wheezes, non-labored Abd: Obese, SNTND, BS present, no guarding or organomegaly   Assessment and plan:  Diarrhea Patient is here with complaints of new "diarrhea". Etiology currently unknown. Differential is concerning for possible upper GI bleed versus C. difficile. Patient having worsening symptoms after meals. Also having possible black loose/tarry stools. Patient is also at high risk for C. difficile as he is an HD patient. - Obtaining labs CBC, CMP, C. Difficile - Patient has to follow-up in one week if symptoms persist. At that time we will to go over labs and develop a diagnostic/treatment plan. - Return precautions discussed   Orders Placed This Encounter  Procedures  . Clostridium difficile EIA    Standing Status:   Future    Number of Occurrences:   1    Standing Expiration Date:   03/03/2018  . CBC  . Comprehensive metabolic panel    Order Specific Question:   Has the patient fasted?    Answer:   No    [name], MD,MS,  PGY3 03/03/2017 4:57 PM

## 2017-03-03 NOTE — Assessment & Plan Note (Signed)
Patient is here with complaints of new "diarrhea". Etiology currently unknown. Differential is concerning for possible upper GI bleed versus C. difficile. Patient having worsening symptoms after meals. Also having possible black loose/tarry stools. Patient is also at high risk for C. difficile as he is an HD patient. - Obtaining labs CBC, CMP, C. Difficile - Patient has to follow-up in one week if symptoms persist. At that time we will to go over labs and develop a diagnostic/treatment plan. - Return precautions discussed

## 2017-03-04 LAB — COMPREHENSIVE METABOLIC PANEL
A/G RATIO: 1 — AB (ref 1.2–2.2)
ALT: 11 IU/L (ref 0–44)
AST: 15 IU/L (ref 0–40)
Albumin: 4 g/dL (ref 3.6–4.8)
Alkaline Phosphatase: 61 IU/L (ref 39–117)
BUN/Creatinine Ratio: 4 — ABNORMAL LOW (ref 10–24)
BUN: 36 mg/dL — ABNORMAL HIGH (ref 8–27)
Bilirubin Total: 0.4 mg/dL (ref 0.0–1.2)
CALCIUM: 9.6 mg/dL (ref 8.6–10.2)
CO2: 26 mmol/L (ref 18–29)
Chloride: 94 mmol/L — ABNORMAL LOW (ref 96–106)
Creatinine, Ser: 8.94 mg/dL — ABNORMAL HIGH (ref 0.76–1.27)
GFR calc Af Amer: 6 mL/min/{1.73_m2} — ABNORMAL LOW (ref >59)
GFR, EST NON AFRICAN AMERICAN: 5 mL/min/{1.73_m2} — AB (ref >59)
GLOBULIN, TOTAL: 4 g/dL (ref 1.5–4.5)
Glucose: 98 mg/dL (ref 65–99)
POTASSIUM: 4.6 mmol/L (ref 3.5–5.2)
SODIUM: 138 mmol/L (ref 134–144)
Total Protein: 8 g/dL (ref 6.0–8.5)

## 2017-03-04 LAB — CBC
HEMATOCRIT: 34.8 % — AB (ref 37.5–51.0)
Hemoglobin: 11.8 g/dL — ABNORMAL LOW (ref 13.0–17.7)
MCH: 30.8 pg (ref 26.6–33.0)
MCHC: 33.9 g/dL (ref 31.5–35.7)
MCV: 91 fL (ref 79–97)
Platelets: 170 10*3/uL (ref 150–379)
RBC: 3.83 x10E6/uL — ABNORMAL LOW (ref 4.14–5.80)
RDW: 16.9 % — AB (ref 12.3–15.4)
WBC: 8.7 10*3/uL (ref 3.4–10.8)

## 2017-03-07 ENCOUNTER — Ambulatory Visit (INDEPENDENT_AMBULATORY_CARE_PROVIDER_SITE_OTHER): Payer: Medicare Other | Admitting: Sports Medicine

## 2017-03-07 DIAGNOSIS — B351 Tinea unguium: Secondary | ICD-10-CM | POA: Diagnosis not present

## 2017-03-07 DIAGNOSIS — N186 End stage renal disease: Secondary | ICD-10-CM

## 2017-03-07 DIAGNOSIS — E1122 Type 2 diabetes mellitus with diabetic chronic kidney disease: Secondary | ICD-10-CM

## 2017-03-07 DIAGNOSIS — Z992 Dependence on renal dialysis: Secondary | ICD-10-CM

## 2017-03-07 NOTE — Progress Notes (Signed)
Patient ID: [name], male   DOB: [birth year], 66 y.o.   MRN: [id number]  Subjective: [name] is a 66 y.o. male patient with history of type 2 diabetes who returns to office today complaining of long, painful nails  while ambulating in shoes; unable to trim. FBS not recorded. On HD 3x/wk. Patient denies any new changes in medication or new problems. Patient denies any new cramping, numbness, burning or tingling in the legs.  Patient goes to wound care center for legs; being treated for lymphedema and ulceration with compression wraps to both legs. He changes dressings himself 2x weekly.   Patient Active Problem List   Diagnosis Date Noted  . Diarrhea 03/03/2017  . Venous stasis ulcers of both lower extremities ([place]) 08/07/2015  . Varicose veins of lower extremities with complications 61/44/3154  . Malfunction of arteriovenous dialysis fistula ([place])   . CHF (congestive heart failure) ([place]) 06/23/2015  . Lymphedema distichiasis syndrome with kidney disease and diabetes mellitus ([place]) 04/06/2015  . Lymphedema of lower extremity 04/04/2015  . DM (diabetes mellitus), type 2 with renal complications ([place]) 00/86/7619  . MGUS (monoclonal gammopathy of unknown significance) 06/12/2014  . Special screening for malignant neoplasms, colon 07/09/2013  . Proliferative diabetic retinopathy ([place]) 10/23/2012  . Knee pain 08/21/2012  . Mass of thigh 08/21/2012  . ORGANIC IMPOTENCE 12/02/2009  . ONYCHOMYCOSIS 07/17/2009  . ESRD (end stage renal disease) on dialysis ([place]) 06/09/2008  . Hyperlipidemia LDL goal < 100 03/19/2007  . OBESITY, MORBID 02/08/2007  . HYPERTENSION, BENIGN SYSTEMIC 02/08/2007  . Atherosclerosis of native arteries of extremity with intermittent claudication ([place]) 02/08/2007   Current Outpatient Prescriptions on File Prior to Visit  Medication Sig Dispense Refill  . amLODipine (NORVASC) 10 MG tablet Take 10 mg by mouth every morning.    [name] aspirin EC 81 MG tablet Take 81  mg by mouth every morning.    . calcium acetate (PHOSLO) 667 MG capsule 1,334-2,668 mg.     . carvedilol (COREG) 25 MG tablet Take 1 tablet (25 mg total) by mouth 2 (two) times daily with a meal. 60 tablet 11  . darbepoetin (ARANESP) 100 MCG/0.5ML SOLN injection Inject 100 mcg into the skin every 14 (fourteen) days. [facility].    . furosemide (LASIX) 80 MG tablet Take 1 tablet (80 mg total) by mouth 2 (two) times daily. 60 tablet 2   No current facility-administered medications on file prior to visit.    Allergies  Allergen Reactions  . Shrimp [Shellfish Allergy] Swelling  . Iodine Swelling    06/29/2015: Patient stated IV contrast dye and iodine on skin has not caused any reaction.    Recent Results (from the past 2160 hour(s))  CBC     Status: Abnormal   Collection Time: 03/03/17  4:35 PM  Result Value Ref Range   WBC 8.7 3.4 - 10.8 x10E3/uL   RBC 3.83 (L) 4.14 - 5.80 x10E6/uL   Hemoglobin 11.8 (L) 13.0 - 17.7 g/dL   Hematocrit 34.8 (L) 37.5 - 51.0 %   MCV 91 79 - 97 fL   MCH 30.8 26.6 - 33.0 pg   MCHC 33.9 31.5 - 35.7 g/dL   RDW 16.9 (H) 12.3 - 15.4 %   Platelets 170 150 - 379 x10E3/uL  Comprehensive metabolic panel     Status: Abnormal   Collection Time: 03/03/17  4:35 PM  Result Value Ref Range   Glucose 98 65 - 99 mg/dL   BUN 36 (H) 8 -  27 mg/dL   Creatinine, Ser 8.94 (H) 0.76 - 1.27 mg/dL   GFR calc non Af Amer 5 (L) >59 mL/min/1.73   GFR calc Af Amer 6 (L) >59 mL/min/1.73   BUN/Creatinine Ratio 4 (L) 10 - 24   Sodium 138 134 - 144 mmol/L   Potassium 4.6 3.5 - 5.2 mmol/L   Chloride 94 (L) 96 - 106 mmol/L   CO2 26 18 - 29 mmol/L   Calcium 9.6 8.6 - 10.2 mg/dL   Total Protein 8.0 6.0 - 8.5 g/dL   Albumin 4.0 3.6 - 4.8 g/dL   Globulin, Total 4.0 1.5 - 4.5 g/dL   Albumin/Globulin Ratio 1.0 (L) 1.2 - 2.2   Bilirubin Total 0.4 0.0 - 1.2 mg/dL   Alkaline Phosphatase 61 39 - 117 IU/L   AST 15 0 - 40 IU/L   ALT 11 0 - 44 IU/L    Objective: General: Patient is  awake, alert, and oriented x 3 and in no acute distress.  Integument: Skin is warm, extremely dry and non-supple bilateral with trophic chronic edema changes and plaques of hyperpigmented and keratotic skin. Nails are tender, long, thickened and dystrophic with subungual debris, consistent with onychomycosis, 1-4 bilateral. No signs of infection. No open lesions or preulcerative lesions present bilateral, diffuse callus sub met 1 with no signs of infection. Compression wraps present to legs above the ankle; patient being treated by wound care center. Remaining integument unremarkable.  Vasculature:  Dorsalis Pedis pulse 1/4 bilateral. Posterior Tibial pulse  0/4 bilateral. Capillary fill time <3 sec 1-4 bilateral. No hair growth to the level of the digits.Temperature gradient within normal limits. No varicosities present bilateral. Lymphedema present bilateral.   Neurology: The patient has diminished sensation measured with a 5.07/10g Semmes Weinstein Monofilament at all pedal sites bilateral . Vibratory sensation diminished bilateral with tuning fork. No Babinski sign present bilateral.   Musculoskeletal: 5th toe amputation status noted bilateral. Muscular strength 5/5 in all lower extremity muscular groups bilateral without pain on range of motion. No tenderness with calf compression bilateral.  Assessment and Plan: Problem List Items Addressed This Visit      Musculoskeletal and Integument   ONYCHOMYCOSIS - Primary    Other Visit Diagnoses    Type 2 diabetes mellitus with chronic kidney disease on chronic dialysis, without long-term current use of insulin ([place])         -Examined patient. -Discussed and educated patient on diabetic foot care, especially with  regards to the vascular, neurological and musculoskeletal systems.  -Stressed the importance of good glycemic control and the detriment of not  controlling glucose levels in relation to the foot. -Mechanically debrided all nails 1-4  bilateral using sterile nail nipper and filed with dremel without incident  -Continue with Bag Balm or [place] to feet daily  -Continue with Diabetic Shoes   -Answered all patient questions -Patient to return in 3 months for at risk foot care. Patient to continue with follow up at wound care center for lymphedema/ulcer management -Patient advised to call the office if any problems or questions arise in the  Meantime.  Landis Martins, DPM

## 2017-03-08 LAB — CLOSTRIDIUM DIFFICILE EIA: C difficile Toxins A+B, EIA: NEGATIVE

## 2017-03-13 ENCOUNTER — Ambulatory Visit (INDEPENDENT_AMBULATORY_CARE_PROVIDER_SITE_OTHER): Payer: Medicare Other | Admitting: Family Medicine

## 2017-03-13 DIAGNOSIS — E1122 Type 2 diabetes mellitus with diabetic chronic kidney disease: Secondary | ICD-10-CM | POA: Diagnosis not present

## 2017-03-13 DIAGNOSIS — E785 Hyperlipidemia, unspecified: Secondary | ICD-10-CM | POA: Diagnosis not present

## 2017-03-13 DIAGNOSIS — Z20828 Contact with and (suspected) exposure to other viral communicable diseases: Secondary | ICD-10-CM

## 2017-03-13 DIAGNOSIS — R197 Diarrhea, unspecified: Secondary | ICD-10-CM

## 2017-03-13 DIAGNOSIS — Z23 Encounter for immunization: Secondary | ICD-10-CM | POA: Diagnosis not present

## 2017-03-13 DIAGNOSIS — N184 Chronic kidney disease, stage 4 (severe): Secondary | ICD-10-CM | POA: Diagnosis not present

## 2017-03-13 LAB — POCT GLYCOSYLATED HEMOGLOBIN (HGB A1C): HEMOGLOBIN A1C: 6.8

## 2017-03-13 MED ORDER — PANTOPRAZOLE SODIUM 40 MG PO TBEC: 40.0000 mg | DELAYED_RELEASE_TABLET | Freq: Every day | ORAL | 0 refills | Status: DC

## 2017-03-13 MED ORDER — TETANUS-DIPHTH-ACELL PERTUSSIS 5-2-15.5 LF-MCG/0.5 IM SUSP: 0.5000 mL | Freq: Once | INTRAMUSCULAR | 0 refills | Status: AC

## 2017-03-13 NOTE — Patient Instructions (Signed)
It was a pleasure seeing you today in our clinic. Today we discussed your abdominal discomfort and diarrhea. Here is the treatment plan we have discussed and agreed upon together:   - There were no surprising abnormalities on the labs that were drawn at your last visit. I'm glad that your symptoms seem to be improving. - I have placed you on a 1 time a day medication called pantoprazole. Take this every day for the next 30 days. - Take note on whether or not this medication seems to improve your abdominal pain. My hope is that it will at this pain will not recur after taking this for 30 days. However, if it does come back out like to see you and we can discuss a more long-term treatment plan.

## 2017-03-13 NOTE — Progress Notes (Signed)
   HPI  CC: Follow-up diarrhea Patient is here to follow-up on his long-standing diarrhea. He was seen last week for diarrhea that have been going on for approximately 3 weeks at a time. He states that his symptoms since our last visit have improved significantly. He still has regular loose stools but the number has decreased significantly. He endorses increased flatulence over the past week. Otherwise feels well. He has not changed anything in his diet. Denies nausea or vomiting. No weakness or fatigue. No hematochezia or melena. No fevers or chills.  History of diabetes and ESRD: Patient endorses good compliance with his hemodialysis regimen. He is currently diet-controlled regarding his diabetes. Patient does not take a statin. Patient is compliant with obtaining labs today to assess lipid status and A1c.  Review of Systems See HPI for ROS.   CC, SH/smoking status, and VS noted  Objective: BP (!) 164/82   Pulse 78   Temp 98.4 F (36.9 C) (Oral)   Wt 271 lb (122.9 kg)   BMI 37.80 kg/m  Gen: NAD, alert, cooperative, and pleasant. CV: Well-perfused Resp: non-labored Abd: SNTND, BS present, no guarding or organomegaly  Assessment and plan:  Diarrhea Improved: Patient is here following up on his diarrhea. Symptoms have dramatically improved. Last visits lab results were unremarkable. Etiology likely viral versus irritation. Patient's lack of significant abdominal pain makes ulceration unlikely however gastritis cannot be ruled out. - Initiate PPI: Pantoprazole 40 mg daily 30 days. We'll assess response to this medication over the next month. Patient informed to follow-up with me 2 weeks after completion of this medication if his symptoms recur.  Hyperlipidemia Patient has a history of hyperlipidemia in the past but no statin in his medication regimen. He also does not have any listed allergy to statins. No current lipid panels in his chart. - Obtain lipid panel   Orders Placed  This Encounter  Procedures  . Lipid panel  . Hepatitis C antibody  . POCT glycosylated hemoglobin (Hb A1C)    Meds ordered this encounter  Medications  . pantoprazole (PROTONIX) 40 MG tablet    Sig: Take 1 tablet (40 mg total) by mouth daily.    Dispense:  30 tablet    Refill:  0  . Tdap (ADACEL) 04-12-14.5 LF-MCG/0.5 injection    Sig: Inject 0.5 mLs into the muscle once.    Dispense:  0.5 mL    Refill:  0     [name], MD,MS,  PGY3 03/13/2017 6:16 PM

## 2017-03-13 NOTE — Assessment & Plan Note (Signed)
Patient has a history of hyperlipidemia in the past but no statin in his medication regimen. He also does not have any listed allergy to statins. No current lipid panels in his chart. - Obtain lipid panel

## 2017-03-13 NOTE — Assessment & Plan Note (Signed)
Improved: Patient is here following up on his diarrhea. Symptoms have dramatically improved. Last visits lab results were unremarkable. Etiology likely viral versus irritation. Patient's lack of significant abdominal pain makes ulceration unlikely however gastritis cannot be ruled out. - Initiate PPI: Pantoprazole 40 mg daily 30 days. We'll assess response to this medication over the next month. Patient informed to follow-up with me 2 weeks after completion of this medication if his symptoms recur.

## 2017-03-14 ENCOUNTER — Telehealth: Payer: Self-pay | Admitting: Family Medicine

## 2017-03-14 ENCOUNTER — Encounter: Payer: Self-pay | Admitting: Family Medicine

## 2017-03-14 LAB — LIPID PANEL
CHOLESTEROL TOTAL: 184 mg/dL (ref 100–199)
Chol/HDL Ratio: 6.3 ratio — ABNORMAL HIGH (ref 0.0–5.0)
HDL: 29 mg/dL — AB (ref >39)
LDL Calculated: 115 mg/dL — ABNORMAL HIGH (ref 0–99)
Triglycerides: 201 mg/dL — ABNORMAL HIGH (ref 0–149)
VLDL CHOLESTEROL CAL: 40 mg/dL (ref 5–40)

## 2017-03-14 LAB — HEPATITIS C ANTIBODY: Hep C Virus Ab: <0.1 s/co ratio (ref 0.0–0.9)

## 2017-03-14 NOTE — Telephone Encounter (Signed)
Called patient to discuss his cholesterol, and to recommend initiating statin therapy. Patient was very adamant that he did not want to start statin therapy at this time. He acknowledged that this was against my recommendation. I informed him that if you were to change his mind or need any additional information that all he had to do schedule appointment to see me. I also sent him the following message on the results letter I sent him with his lab results:  "After looking at your cholesterol, I think it would be ideal to have you on a medication for your cholesterol. This will help reduce to risk of Stroke and Heart Attack. As you are aware from our phone conversation I would strongly encourage you to start this medication at this time, but I understand that you have no desire at this time to do so. This is absolutely your right, but if you change your mind, or would like additional information about Statin medications or cholesterol lowering practices just make an appointment to see me (or any of my colleagues) in our office."   [name], MD,MS,  PGY3 03/14/2017 7:16 PM

## 2017-03-14 NOTE — Addendum Note (Signed)
Addended by: Levert Feinstein F on: 03/14/2017 10:25 AM   Modules accepted: Orders

## 2017-03-21 ENCOUNTER — Emergency Department (HOSPITAL_COMMUNITY): Payer: Medicare Other

## 2017-03-21 ENCOUNTER — Inpatient Hospital Stay (HOSPITAL_COMMUNITY)
Admission: EM | Admit: 2017-03-21 | Discharge: 2017-03-28 | DRG: 871 | Disposition: A | Discharge status: Home Health | Payer: Medicare Other | Attending: Family Medicine | Admitting: Family Medicine

## 2017-03-21 DIAGNOSIS — R7881 Bacteremia: Secondary | ICD-10-CM

## 2017-03-21 DIAGNOSIS — Z888 Allergy status to other drugs, medicaments and biological substances status: Secondary | ICD-10-CM

## 2017-03-21 DIAGNOSIS — A419 Sepsis, unspecified organism: Secondary | ICD-10-CM | POA: Diagnosis present

## 2017-03-21 DIAGNOSIS — N2581 Secondary hyperparathyroidism of renal origin: Secondary | ICD-10-CM | POA: Diagnosis present

## 2017-03-21 DIAGNOSIS — I480 Paroxysmal atrial fibrillation: Secondary | ICD-10-CM | POA: Diagnosis present

## 2017-03-21 DIAGNOSIS — Z6838 Body mass index (BMI) 38.0-38.9, adult: Secondary | ICD-10-CM

## 2017-03-21 DIAGNOSIS — Z833 Family history of diabetes mellitus: Secondary | ICD-10-CM | POA: Diagnosis not present

## 2017-03-21 DIAGNOSIS — N186 End stage renal disease: Secondary | ICD-10-CM | POA: Diagnosis present

## 2017-03-21 DIAGNOSIS — J690 Pneumonitis due to inhalation of food and vomit: Secondary | ICD-10-CM | POA: Diagnosis present

## 2017-03-21 DIAGNOSIS — Z9115 Patient's noncompliance with renal dialysis: Secondary | ICD-10-CM

## 2017-03-21 DIAGNOSIS — Z7984 Long term (current) use of oral hypoglycemic drugs: Secondary | ICD-10-CM

## 2017-03-21 DIAGNOSIS — R9431 Abnormal electrocardiogram [ECG] [EKG]: Secondary | ICD-10-CM | POA: Diagnosis not present

## 2017-03-21 DIAGNOSIS — J45909 Unspecified asthma, uncomplicated: Secondary | ICD-10-CM | POA: Diagnosis present

## 2017-03-21 DIAGNOSIS — E1122 Type 2 diabetes mellitus with diabetic chronic kidney disease: Secondary | ICD-10-CM | POA: Diagnosis present

## 2017-03-21 DIAGNOSIS — D61818 Other pancytopenia: Secondary | ICD-10-CM | POA: Diagnosis present

## 2017-03-21 DIAGNOSIS — E872 Acidosis: Secondary | ICD-10-CM | POA: Diagnosis present

## 2017-03-21 DIAGNOSIS — R7989 Other specified abnormal findings of blood chemistry: Secondary | ICD-10-CM | POA: Diagnosis not present

## 2017-03-21 DIAGNOSIS — I440 Atrioventricular block, first degree: Secondary | ICD-10-CM | POA: Diagnosis not present

## 2017-03-21 DIAGNOSIS — R310 Gross hematuria: Secondary | ICD-10-CM | POA: Diagnosis not present

## 2017-03-21 DIAGNOSIS — Z91041 Radiographic dye allergy status: Secondary | ICD-10-CM

## 2017-03-21 DIAGNOSIS — I132 Hypertensive heart and chronic kidney disease with heart failure and with stage 5 chronic kidney disease, or end stage renal disease: Secondary | ICD-10-CM | POA: Diagnosis present

## 2017-03-21 DIAGNOSIS — E440 Moderate protein-calorie malnutrition: Secondary | ICD-10-CM | POA: Diagnosis present

## 2017-03-21 DIAGNOSIS — I878 Other specified disorders of veins: Secondary | ICD-10-CM | POA: Diagnosis present

## 2017-03-21 DIAGNOSIS — I4891 Unspecified atrial fibrillation: Secondary | ICD-10-CM | POA: Diagnosis not present

## 2017-03-21 DIAGNOSIS — E785 Hyperlipidemia, unspecified: Secondary | ICD-10-CM | POA: Diagnosis present

## 2017-03-21 DIAGNOSIS — R509 Fever, unspecified: Secondary | ICD-10-CM

## 2017-03-21 DIAGNOSIS — D472 Monoclonal gammopathy: Secondary | ICD-10-CM | POA: Diagnosis present

## 2017-03-21 DIAGNOSIS — R6510 Systemic inflammatory response syndrome (SIRS) of non-infectious origin without acute organ dysfunction: Secondary | ICD-10-CM | POA: Diagnosis not present

## 2017-03-21 DIAGNOSIS — K220 Achalasia of cardia: Secondary | ICD-10-CM | POA: Diagnosis present

## 2017-03-21 DIAGNOSIS — I872 Venous insufficiency (chronic) (peripheral): Secondary | ICD-10-CM | POA: Diagnosis present

## 2017-03-21 DIAGNOSIS — K029 Dental caries, unspecified: Secondary | ICD-10-CM | POA: Diagnosis present

## 2017-03-21 DIAGNOSIS — E113599 Type 2 diabetes mellitus with proliferative diabetic retinopathy without macular edema, unspecified eye: Secondary | ICD-10-CM | POA: Diagnosis present

## 2017-03-21 DIAGNOSIS — Z8249 Family history of ischemic heart disease and other diseases of the circulatory system: Secondary | ICD-10-CM | POA: Diagnosis not present

## 2017-03-21 DIAGNOSIS — K08409 Partial loss of teeth, unspecified cause, unspecified class: Secondary | ICD-10-CM | POA: Diagnosis not present

## 2017-03-21 DIAGNOSIS — E1151 Type 2 diabetes mellitus with diabetic peripheral angiopathy without gangrene: Secondary | ICD-10-CM | POA: Diagnosis present

## 2017-03-21 DIAGNOSIS — Z992 Dependence on renal dialysis: Secondary | ICD-10-CM

## 2017-03-21 DIAGNOSIS — K429 Umbilical hernia without obstruction or gangrene: Secondary | ICD-10-CM | POA: Diagnosis present

## 2017-03-21 DIAGNOSIS — K06020 Generalized gingival recession, unspecified: Secondary | ICD-10-CM | POA: Diagnosis present

## 2017-03-21 DIAGNOSIS — R748 Abnormal levels of other serum enzymes: Secondary | ICD-10-CM | POA: Diagnosis present

## 2017-03-21 DIAGNOSIS — Z7982 Long term (current) use of aspirin: Secondary | ICD-10-CM

## 2017-03-21 DIAGNOSIS — Z87442 Personal history of urinary calculi: Secondary | ICD-10-CM

## 2017-03-21 DIAGNOSIS — Z87891 Personal history of nicotine dependence: Secondary | ICD-10-CM | POA: Diagnosis not present

## 2017-03-21 DIAGNOSIS — C9000 Multiple myeloma not having achieved remission: Secondary | ICD-10-CM | POA: Diagnosis present

## 2017-03-21 DIAGNOSIS — Z91013 Allergy to seafood: Secondary | ICD-10-CM | POA: Diagnosis not present

## 2017-03-21 DIAGNOSIS — D696 Thrombocytopenia, unspecified: Secondary | ICD-10-CM | POA: Diagnosis not present

## 2017-03-21 DIAGNOSIS — R1011 Right upper quadrant pain: Secondary | ICD-10-CM | POA: Diagnosis not present

## 2017-03-21 DIAGNOSIS — N3021 Other chronic cystitis with hematuria: Secondary | ICD-10-CM | POA: Diagnosis present

## 2017-03-21 DIAGNOSIS — Z89421 Acquired absence of other right toe(s): Secondary | ICD-10-CM

## 2017-03-21 DIAGNOSIS — M542 Cervicalgia: Secondary | ICD-10-CM | POA: Diagnosis not present

## 2017-03-21 DIAGNOSIS — K045 Chronic apical periodontitis: Secondary | ICD-10-CM | POA: Diagnosis not present

## 2017-03-21 DIAGNOSIS — Z89422 Acquired absence of other left toe(s): Secondary | ICD-10-CM

## 2017-03-21 DIAGNOSIS — I5042 Chronic combined systolic (congestive) and diastolic (congestive) heart failure: Secondary | ICD-10-CM | POA: Diagnosis present

## 2017-03-21 DIAGNOSIS — Z713 Dietary counseling and surveillance: Secondary | ICD-10-CM

## 2017-03-21 DIAGNOSIS — R011 Cardiac murmur, unspecified: Secondary | ICD-10-CM | POA: Diagnosis present

## 2017-03-21 DIAGNOSIS — I10 Essential (primary) hypertension: Secondary | ICD-10-CM | POA: Diagnosis not present

## 2017-03-21 DIAGNOSIS — Z538 Procedure and treatment not carried out for other reasons: Secondary | ICD-10-CM | POA: Diagnosis not present

## 2017-03-21 DIAGNOSIS — I5032 Chronic diastolic (congestive) heart failure: Secondary | ICD-10-CM | POA: Diagnosis not present

## 2017-03-21 DIAGNOSIS — Z961 Presence of intraocular lens: Secondary | ICD-10-CM | POA: Diagnosis present

## 2017-03-21 DIAGNOSIS — Z79899 Other long term (current) drug therapy: Secondary | ICD-10-CM

## 2017-03-21 DIAGNOSIS — K083 Retained dental root: Secondary | ICD-10-CM | POA: Diagnosis present

## 2017-03-21 DIAGNOSIS — R001 Bradycardia, unspecified: Secondary | ICD-10-CM | POA: Diagnosis not present

## 2017-03-21 HISTORY — DX: Disorder of kidney and ureter, unspecified: N28.9

## 2017-03-21 LAB — COMPREHENSIVE METABOLIC PANEL
ALBUMIN: 2.9 g/dL — AB (ref 3.5–5.0)
ALT: 15 U/L — AB (ref 17–63)
AST: 29 U/L (ref 15–41)
Alkaline Phosphatase: 57 U/L (ref 38–126)
Anion gap: 13 (ref 5–15)
BUN: 37 mg/dL — ABNORMAL HIGH (ref 6–20)
CO2: 26 mmol/L (ref 22–32)
Calcium: 8.5 mg/dL — ABNORMAL LOW (ref 8.9–10.3)
Chloride: 99 mmol/L — ABNORMAL LOW (ref 101–111)
Creatinine, Ser: 10.82 mg/dL — ABNORMAL HIGH (ref 0.61–1.24)
GFR calc Af Amer: 5 mL/min — ABNORMAL LOW (ref >60)
GFR calc non Af Amer: 4 mL/min — ABNORMAL LOW (ref >60)
Glucose, Bld: 123 mg/dL — ABNORMAL HIGH (ref 65–99)
Potassium: 4.1 mmol/L (ref 3.5–5.1)
Sodium: 138 mmol/L (ref 135–145)
TOTAL PROTEIN: 7 g/dL (ref 6.5–8.1)
Total Bilirubin: 1.1 mg/dL (ref 0.3–1.2)

## 2017-03-21 LAB — CBC WITH DIFFERENTIAL/PLATELET
BASOS PCT: 0 %
Basophils Absolute: 0 10*3/uL (ref 0.0–0.1)
EOS ABS: 0.1 10*3/uL (ref 0.0–0.7)
Eosinophils Relative: 1 %
HCT: 33.9 % — ABNORMAL LOW (ref 39.0–52.0)
HEMOGLOBIN: 10.9 g/dL — AB (ref 13.0–17.0)
Lymphocytes Relative: 7 %
Lymphs Abs: 0.4 10*3/uL — ABNORMAL LOW (ref 0.7–4.0)
MCH: 30.4 pg (ref 26.0–34.0)
MCHC: 32.2 g/dL (ref 30.0–36.0)
MCV: 94.7 fL (ref 78.0–100.0)
Monocytes Absolute: 0.1 10*3/uL (ref 0.1–1.0)
Monocytes Relative: 2 %
NEUTROS PCT: 90 %
Neutro Abs: 4.4 10*3/uL (ref 1.7–7.7)
PLATELETS: 63 10*3/uL — AB (ref 150–400)
RBC: 3.58 MIL/uL — ABNORMAL LOW (ref 4.22–5.81)
RDW: 16.5 % — ABNORMAL HIGH (ref 11.5–15.5)
WBC: 4.9 10*3/uL (ref 4.0–10.5)

## 2017-03-21 LAB — URINALYSIS, ROUTINE W REFLEX MICROSCOPIC
BILIRUBIN URINE: NEGATIVE
Glucose, UA: NEGATIVE mg/dL
Ketones, ur: NEGATIVE mg/dL
NITRITE: NEGATIVE
PH: 7 (ref 5.0–8.0)
Protein, ur: 100 mg/dL — AB
SPECIFIC GRAVITY, URINE: 1.013 (ref 1.005–1.030)

## 2017-03-21 LAB — I-STAT CG4 LACTIC ACID, ED
LACTIC ACID, VENOUS: 3.6 mmol/L — AB (ref 0.5–1.9)
Lactic Acid, Venous: 3.35 mmol/L (ref 0.5–1.9)

## 2017-03-21 LAB — TROPONIN I: Troponin I: 0.45 ng/mL (ref <0.03)

## 2017-03-21 LAB — LACTIC ACID, PLASMA: LACTIC ACID, VENOUS: 2.8 mmol/L — AB (ref 0.5–1.9)

## 2017-03-21 LAB — PROTIME-INR
INR: 1.4
PROTHROMBIN TIME: 17.3 s — AB (ref 11.4–15.2)

## 2017-03-21 LAB — GLUCOSE, CAPILLARY: GLUCOSE-CAPILLARY: 104 mg/dL — AB (ref 65–99)

## 2017-03-21 MED ORDER — POLYETHYLENE GLYCOL 3350 17 G PO PACK: 17.0000 g | PACK | Freq: Every day | ORAL | Status: DC | PRN

## 2017-03-21 MED ORDER — ACETAMINOPHEN 325 MG PO TABS
650.0000 mg | ORAL_TABLET | Freq: Once | ORAL | Status: AC
  Administered 2017-03-21: 650 mg via ORAL
  Filled 2017-03-21: qty 2

## 2017-03-21 MED ORDER — ASPIRIN EC 81 MG PO TBEC
81.0000 mg | DELAYED_RELEASE_TABLET | Freq: Every morning | ORAL | Status: DC
  Administered 2017-03-22: 81 mg via ORAL
  Filled 2017-03-21 (×2): qty 1

## 2017-03-21 MED ORDER — ACETAMINOPHEN 650 MG RE SUPP: 650.0000 mg | Freq: Four times a day (QID) | RECTAL | Status: DC | PRN

## 2017-03-21 MED ORDER — IPRATROPIUM-ALBUTEROL 0.5-2.5 (3) MG/3ML IN SOLN
3.0000 mL | Freq: Four times a day (QID) | RESPIRATORY_TRACT | Status: DC
  Administered 2017-03-22: 3 mL via RESPIRATORY_TRACT
  Filled 2017-03-21: qty 3

## 2017-03-21 MED ORDER — ONDANSETRON HCL 4 MG/2ML IJ SOLN: 4.0000 mg | Freq: Four times a day (QID) | INTRAMUSCULAR | Status: DC | PRN

## 2017-03-21 MED ORDER — SODIUM CHLORIDE 0.9 % IV SOLN
INTRAVENOUS | Status: DC
  Administered 2017-03-21: 23:00:00 via INTRAVENOUS

## 2017-03-21 MED ORDER — ONDANSETRON HCL 4 MG PO TABS: 4.0000 mg | ORAL_TABLET | Freq: Four times a day (QID) | ORAL | Status: DC | PRN

## 2017-03-21 MED ORDER — VANCOMYCIN HCL IN DEXTROSE 1-5 GM/200ML-% IV SOLN: 1000.0000 mg | Freq: Once | INTRAVENOUS | Status: DC

## 2017-03-21 MED ORDER — PIPERACILLIN-TAZOBACTAM 3.375 G IVPB
3.3750 g | Freq: Two times a day (BID) | INTRAVENOUS | Status: DC
  Administered 2017-03-22 – 2017-03-24 (×4): 3.375 g via INTRAVENOUS
  Filled 2017-03-21 (×6): qty 50

## 2017-03-21 MED ORDER — ACETAMINOPHEN 325 MG PO TABS
650.0000 mg | ORAL_TABLET | Freq: Four times a day (QID) | ORAL | Status: DC | PRN
  Administered 2017-03-23: 650 mg via ORAL
  Filled 2017-03-21: qty 2

## 2017-03-21 MED ORDER — PIPERACILLIN-TAZOBACTAM 3.375 G IVPB 30 MIN
3.3750 g | Freq: Once | INTRAVENOUS | Status: AC
  Administered 2017-03-21: 3.375 g via INTRAVENOUS
  Filled 2017-03-21: qty 50

## 2017-03-21 MED ORDER — SODIUM CHLORIDE 0.9 % IV BOLUS (SEPSIS)
250.0000 mL | Freq: Once | INTRAVENOUS | Status: AC
  Administered 2017-03-21: 250 mL via INTRAVENOUS

## 2017-03-21 MED ORDER — VANCOMYCIN HCL 10 G IV SOLR
2000.0000 mg | Freq: Once | INTRAVENOUS | Status: AC
  Administered 2017-03-21: 2000 mg via INTRAVENOUS
  Filled 2017-03-21: qty 2000

## 2017-03-21 MED ORDER — CARVEDILOL 25 MG PO TABS
25.0000 mg | ORAL_TABLET | Freq: Two times a day (BID) | ORAL | Status: DC
  Administered 2017-03-22: 25 mg via ORAL
  Filled 2017-03-21 (×2): qty 1

## 2017-03-21 NOTE — Progress Notes (Signed)
Spoke to cardiology Dr Radford Pax, the cardiology fellow, regarding newly found afib. Her recommendations were that as long a the pateint is rate controlled he does not need urgent intervention. He is thrombocytopenic which limits anticoagulation and she agreed on holding anticoagulation until platelet counts improved. She agreed with echocardiogram and that we could formally consult cardiology in the AM.  - will continue to monitor heart rate and hemodynamic status - cards consult in AM

## 2017-03-21 NOTE — ED Notes (Signed)
Changed pt bp cuff to a smaller cuff in hopes that it would bring pts bp up. pts bp was 66/36 upon changing cuffs it was 93/59

## 2017-03-21 NOTE — ED Notes (Signed)
Iv team at bedside  

## 2017-03-21 NOTE — ED Triage Notes (Signed)
To ED via [facility], from dialysis, with a complaint of feeling weak and light-headed. Pt from dialysis. cbg 135.

## 2017-03-21 NOTE — ED Notes (Signed)
Attempted report x1. 

## 2017-03-21 NOTE — ED Notes (Addendum)
Pt on phone, with bp cuff raised.pulse ox reconnected.

## 2017-03-21 NOTE — H&P (Signed)
[facility] Admission History and Physical Service Pager: [phone]  Patient name: [name] Medical record number: [id number] Date of birth: 08-19-1951 Age: 66 y.o. Gender: male  Primary Care Provider: Georges Lynch, MD Consultants: Nephrology  Code Status: FULL (per discussion on admission)  Chief Complaint: weakness  Assessment and Plan: [name] is a 66 y.o. male presenting with fever and weakness, meeting SIRS criteria. PMH is significant for CHF, ESRD on Dialysis, DM, HTN, HLD, Asthma, morbid obesity, lymphedema, and venous insufficiency  Fever, generalized weakness, SIRS: Meeting SIRS criteria with increased respiratory rate in the 20's and fever to 102. Slightly hypotensive in ED to 90's/50's however normotensive on admission exam. O2 in high 90's on room air. Physical exam showing normal work of breathing with expiratory wheezes in bilateral upper lung fields. Skin exam showing no open wounds, lesions, or catheters. Benign abdominal exam. No leukocytosis with WBC at 4.9. Hgb 10.9. Lactic acid 3.6. Creatinine 10.8. Unknown source. Differentials include pneumonia vs pyelonephritis.  - Admit to [place], med surg, attending Dr. Ree Kida - Vital signs per floor protocol - Cardiac monitoring - Blood cultures pending - Obtain UA with urine culture - Vancomycin and Zosyn per pharmacy - Tylenol PRN fevers, zofran PRN nausea - AM CBC and renal function panel - Trend lactic acid - Hold off on fluids for now due to ESRD, may add some if he becomes hypotensive  Lactic Acidosis: Lactic acid 3.6. s/p 250cc bolus.  - Trend  ESRD on HD: Receives HD Tuesday/Thursday/Saturday. Electrolytes stable.  - Nephrology consulted by ED, appreciate assistance - Is on Phoslo at home, unclear about dose of this medication. Will defer dosing to nephrology  - hold home arenesp - AM renal function panel  Asthma: Wheezes appreciated in bilateral upper lung fields on  admission, CXR negative for acute infiltrate. Not on any respiratory medications at home - Duonebs Q6H for now  Diabetes - HgbA1c most recently 6.8 in 03/13/2017.  No medications as an outpatient.  - continue home ASA 81  - CBG qAM, qHS, no coverage, can add sliding scale insulin as needed  HTN - home regimen norvasc 10 mg QD, Coreg 25 mg QD, Lasix - Hold home Lasix of 80 mg BID and norvasc in the setting of sepsis, can add back as needed - Continue home coreg 25 mg BID, can stop if BP doesn't tolerate  Hx of CHF: Echo on 06/25/2015 showing no signs of CHF with EF of 55%. No signs of fluid overload on exam. - Hold home lasix as above - Continue coreg as above  HLD - Last lipid panel 03/13/2017 - Cholesterol 184, HDL 29, LDL 115, Triglyceride 201 - statin recommended by PCP at last outpatient visit - patient had adamantly refused statin - can continue to counsel - continue ASA 81 QD  Morbid Obesity - weight loss counseling  Lymphedema: No signs of edema on exam but does have significant skin changes. - Consider adding cream to help with chronic skin changes  Thrombocytopenia: Plts 63 - Holding Heparin for now - SCDS   FEN/GI: Renal diet, SLIV Prophylaxis: SCDs  Disposition: Admit to Med-surg for antibiotics and sepsis workup  History of Present Illness:  [name] is a 66 y.o. male presenting with fever and weakness in dialysis. Patient was in his usual state of health until dialysis today when he began shaking and was noted to have a fever to ~102F. He notes that he felt very weak. Dialysis was  stopped due to this. He then was transferred to a chair and endorses falling out of the chair as it was being placed behind him. He did not hit his head or lose consciousness. He states that the weakness is generalized, non focal, not unilateral.  He has not had any recent illnessess or sick contacts, and he denies headaches, chest pain, cough, rhinorrhea or congestion, no N/V/D/C.  He  endorses increased urinary frequency but no urgency or dysuria. No fevers prior to today. He notes that sometimes he gets sores on his bottom but denies any skin lesions.   In the ED, code sepsis was called and patient was given 250 ml bolus and started on vancomycin and zosyn. .   Review Of Systems: Per HPI with the following additions: see HPI  ROS  Patient Active Problem List   Diagnosis Date Noted  . Diarrhea 03/03/2017  . Venous stasis ulcers of both lower extremities ([place]) 08/07/2015  . Varicose veins of lower extremities with complications 40/97/3532  . Malfunction of arteriovenous dialysis fistula ([place])   . CHF (congestive heart failure) ([place]) 06/23/2015  . Lymphedema distichiasis syndrome with kidney disease and diabetes mellitus ([place]) 04/06/2015  . Lymphedema of lower extremity 04/04/2015  . DM (diabetes mellitus), type 2 with renal complications ([place]) 99/24/2683  . MGUS (monoclonal gammopathy of unknown significance) 06/12/2014  . Special screening for malignant neoplasms, colon 07/09/2013  . Proliferative diabetic retinopathy ([place]) 10/23/2012  . Knee pain 08/21/2012  . Mass of thigh 08/21/2012  . ORGANIC IMPOTENCE 12/02/2009  . ONYCHOMYCOSIS 07/17/2009  . ESRD (end stage renal disease) on dialysis ([place]) 06/09/2008  . Hyperlipidemia 03/19/2007  . OBESITY, MORBID 02/08/2007  . HYPERTENSION, BENIGN SYSTEMIC 02/08/2007  . Atherosclerosis of native arteries of extremity with intermittent claudication ([place]) 02/08/2007    Past Medical History: Past Medical History:  Diagnosis Date  . Arthritis   . Asthma    as a child  . CHF (congestive heart failure) ([place])   . Chronic cystitis   . Chronic kidney disease   . Diabetes mellitus   . Dry skin   . H/O hiatal hernia    states it's been fixed  . Hyperlipidemia   . Hypertension   . Lymphedema   . Morbid obesity ([place])   . NEPHROLITHIASIS, HX OF 12/02/2009   Qualifier: Diagnosis of  By: [name], Cat    . PVD (peripheral  vascular disease) ([place])   . UMBILICAL HERNIA 03/30/6221   Qualifier: History of  By: [name] MD, Audelia Acton    . Venous insufficiency     Past Surgical History: Past Surgical History:  Procedure Laterality Date  . AMPUTATION     Right and left fifth toes.   . AV FISTULA PLACEMENT Left 03/21/2014   Procedure: ARTERIOVENOUS (AV) FISTULA CREATION with ultrasound;  Surgeon: [name], MD;  Location: [facility];  Service: Vascular;  Laterality: Left;  . COLONOSCOPY    . EYE SURGERY Bilateral    cataract and lens implant  . HERNIA REPAIR    . LIGATION OF COMPETING BRANCHES OF ARTERIOVENOUS FISTULA Left 06/29/2015   Procedure: LIGATION OF LEFT ARM RADIOCEPHALIC ARTERIOVENOUS FISTULA SIDE BRANCHES;  Surgeon: Conrad [place], MD;  Location: [facility];  Service: Vascular;  Laterality: Left;  . Popliteal to posterior tibial bypass     2006  . R knee arthoscopic repair of meniscus    . UMBILICAL HERNIA REPAIR      Social History: Social History  Substance Use Topics  .  Smoking status: Former Smoker    Quit date: 06/06/1979  . Smokeless tobacco: Former Systems developer  . Alcohol use No     Comment:   "years ago" , none now   Please also refer to relevant sections of EMR.  Family History: Family History  Problem Relation Age of Onset  . Diabetes Mother   . Heart disease Mother   . Diabetes Brother     Allergies and Medications: Allergies  Allergen Reactions  . Shrimp [Shellfish Allergy] Swelling  . Iodine Swelling    06/29/2015: Patient stated IV contrast dye and iodine on skin has not caused any reaction.   No current facility-administered medications on file prior to encounter.    Current Outpatient Prescriptions on File Prior to Encounter  Medication Sig Dispense Refill  . amLODipine (NORVASC) 10 MG tablet Take 10 mg by mouth every morning.    [name] aspirin EC 81 MG tablet Take 81 mg by mouth every morning.    . calcium acetate (PHOSLO) 667 MG capsule 1,334-2,668 mg.     . carvedilol (COREG) 25 MG  tablet Take 1 tablet (25 mg total) by mouth 2 (two) times daily with a meal. 60 tablet 11  . darbepoetin (ARANESP) 100 MCG/0.5ML SOLN injection Inject 100 mcg into the skin every 14 (fourteen) days. [facility].    . furosemide (LASIX) 80 MG tablet Take 1 tablet (80 mg total) by mouth 2 (two) times daily. 60 tablet 2  . pantoprazole (PROTONIX) 40 MG tablet Take 1 tablet (40 mg total) by mouth daily. 30 tablet 0    Objective: BP (!) 107/58   Pulse 85   Temp (!) 102 F (38.9 C) (Oral)   Resp 20   SpO2 99%  Exam: General: No apparent distress, rests comfortably in bed, converses normally Eyes: PERRL, EOMI, no scleral icterus, no conjunctival pallor or injection ENTM: tympanic membranes normal bilaterally, no rhinorrhea or nasal exudate, no pharyngeal erythema exudate Neck: no LAD, no thyromegaly Cardiovascular: RRR, no m/r/g Respiratory: Comfortable work of breathing, speaking in full sentences, expiratory wheezing heard in bilateral upper lung fields Gastrointestinal: obese, nontender, nondistended normoactive BS, no hepatosplenomegaly MSK:bil 5th toes surgically absent,  moves 4 extremities equally Derm: +chronic dry skin noted in bilateral lower extremities Neuro: CN II-XII grossly intact, gait not assessed, 5/5 grip strength bilaterally, 5/5 strength in bilateral lower extremities Psych: AAOx3, appropriate affect  Labs and Imaging: CBC BMET   Recent Labs Lab 03/21/17 1450  WBC 4.9  HGB 10.9*  HCT 33.9*  PLT 63*    Recent Labs Lab 03/21/17 1450  NA 138  K 4.1  CL 99*  CO2 26  BUN 37*  CREATININE 10.82*  GLUCOSE 123*  CALCIUM 8.5*     Lactic Acid, Venous    Component Value Date/Time   LATICACIDVEN 3.35 (HH) 03/21/2017 1723   Dg Chest 2 View  03/21/2017 FINDINGS: Cardiomediastinal silhouette is stable. Mild interstitial prominence bilateral lower lobes may represent mild interstitial edema scarring or pneumonitis. No segmental consolidation.  IMPRESSION: Mild  interstitial prominence bilateral lower lobes may represent mild interstitial edema scarring or pneumonitis. No segmental consolidation.    [name], MD 03/21/2017, 4:40 PM PGY-2, [place] Intern pager: [phone], text pages welcome

## 2017-03-21 NOTE — ED Provider Notes (Signed)
[facility] DEPT Provider Note   CSN: [id number] Arrival date & time: 03/21/17  1343     History   Chief Complaint Chief Complaint  Patient presents with  . Weakness    HPI [name] is a 66 y.o. male.  HPI  66 y.o. male with a hx of CHF, ESRD on Dialysis, DM, HTN, HLD, presents to the Emergency Department today complaining of due to weakness. Pt states that he was at his regular dialysis session when he started feeling weak and started shaking. Pt was able to complete half of his dialysis session before he was sent to the ED for evaluation. Noted fever 102F at Dialysis center. Pt denies cough/congestion. No URI symptoms. No CP/SOB/ABD pain. No N/V/D. No nuchal rigidity. No headaches. No numbness/tingling. No meds PTA. PT does note bilateral lower extremity lymphadema that is chronic that he keeps wrapped. No other symptoms noted.    Past Medical History:  Diagnosis Date  . Arthritis   . Asthma    as a child  . CHF (congestive heart failure) ([place])   . Chronic cystitis   . Chronic kidney disease   . Diabetes mellitus   . Dry skin   . H/O hiatal hernia    states it's been fixed  . Hyperlipidemia   . Hypertension   . Lymphedema   . Morbid obesity ([place])   . NEPHROLITHIASIS, HX OF 12/02/2009   Qualifier: Diagnosis of  By: [name], Cat    . PVD (peripheral vascular disease) ([place])   . UMBILICAL HERNIA 09/04/2682   Qualifier: History of  By: [name] MD, Audelia Acton    . Venous insufficiency     Patient Active Problem List   Diagnosis Date Noted  . Diarrhea 03/03/2017  . Venous stasis ulcers of both lower extremities ([place]) 08/07/2015  . Varicose veins of lower extremities with complications 41/96/2229  . Malfunction of arteriovenous dialysis fistula ([place])   . CHF (congestive heart failure) ([place]) 06/23/2015  . Lymphedema distichiasis syndrome with kidney disease and diabetes mellitus ([place]) 04/06/2015  . Lymphedema of lower extremity 04/04/2015  . DM (diabetes mellitus),  type 2 with renal complications ([place]) 79/89/2119  . MGUS (monoclonal gammopathy of unknown significance) 06/12/2014  . Special screening for malignant neoplasms, colon 07/09/2013  . Proliferative diabetic retinopathy ([place]) 10/23/2012  . Knee pain 08/21/2012  . Mass of thigh 08/21/2012  . ORGANIC IMPOTENCE 12/02/2009  . ONYCHOMYCOSIS 07/17/2009  . ESRD (end stage renal disease) on dialysis ([place]) 06/09/2008  . Hyperlipidemia 03/19/2007  . OBESITY, MORBID 02/08/2007  . HYPERTENSION, BENIGN SYSTEMIC 02/08/2007  . Atherosclerosis of native arteries of extremity with intermittent claudication ([place]) 02/08/2007    Past Surgical History:  Procedure Laterality Date  . AMPUTATION     Right and left fifth toes.   . AV FISTULA PLACEMENT Left 03/21/2014   Procedure: ARTERIOVENOUS (AV) FISTULA CREATION with ultrasound;  Surgeon: [name], MD;  Location: [facility];  Service: Vascular;  Laterality: Left;  . COLONOSCOPY    . EYE SURGERY Bilateral    cataract and lens implant  . HERNIA REPAIR    . LIGATION OF COMPETING BRANCHES OF ARTERIOVENOUS FISTULA Left 06/29/2015   Procedure: LIGATION OF LEFT ARM RADIOCEPHALIC ARTERIOVENOUS FISTULA SIDE BRANCHES;  Surgeon: Conrad La Liga, MD;  Location: [facility];  Service: Vascular;  Laterality: Left;  . Popliteal to posterior tibial bypass     2006  . R knee arthoscopic repair of meniscus    . UMBILICAL HERNIA REPAIR  Home Medications    Prior to Admission medications   Medication Sig Start Date End Date Taking? Authorizing Provider  amLODipine (NORVASC) 10 MG tablet Take 10 mg by mouth every morning.    Historical Provider, MD  aspirin EC 81 MG tablet Take 81 mg by mouth every morning.    Historical Provider, MD  calcium acetate (PHOSLO) 667 MG capsule [phone] mg.  07/08/15   Historical Provider, MD  carvedilol (COREG) 25 MG tablet Take 1 tablet (25 mg total) by mouth 2 (two) times daily with a meal. 03/11/14   Frazier Richards, MD  darbepoetin  (ARANESP) 100 MCG/0.5ML SOLN injection Inject 100 mcg into the skin every 14 (fourteen) days. [facility].    Historical Provider, MD  furosemide (LASIX) 80 MG tablet Take 1 tablet (80 mg total) by mouth 2 (two) times daily. 06/15/16   [name], MD  pantoprazole (PROTONIX) 40 MG tablet Take 1 tablet (40 mg total) by mouth daily. 03/13/17   [name], MD    Family History Family History  Problem Relation Age of Onset  . Diabetes Mother   . Heart disease Mother   . Diabetes Brother     Social History Social History  Substance Use Topics  . Smoking status: Former Smoker    Quit date: 06/06/1979  . Smokeless tobacco: Former Systems developer  . Alcohol use No     Comment:   "years ago" , none now     Allergies   Shrimp [shellfish allergy] and Iodine   Review of Systems Review of Systems ROS reviewed and all are negative for acute change except as noted in the HPI.  Physical Exam Updated Vital Signs BP (!) 134/58 (BP Location: Right Arm)   Pulse 89   Temp (!) 102 F (38.9 C) (Oral)   Resp 20   SpO2 98%   Physical Exam  Constitutional: He is oriented to person, place, and time. Vital signs are normal. He appears well-developed and well-nourished.  HENT:  Head: Normocephalic and atraumatic.  Right Ear: Hearing normal.  Left Ear: Hearing normal.  Eyes: Conjunctivae and EOM are normal. Pupils are equal, round, and reactive to light.  Neck: Neck supple.  Cardiovascular: Normal rate, regular rhythm, normal heart sounds and intact distal pulses.   Pulmonary/Chest: Effort normal and breath sounds normal.  Abdominal: Soft. There is no tenderness.  Musculoskeletal: Normal range of motion.  Neurological: He is alert and oriented to person, place, and time.  Skin: Skin is warm and dry.  Psychiatric: He has a normal mood and affect. His speech is normal and behavior is normal. Thought content normal.  Nursing note and vitals reviewed.  ED Treatments / Results  Labs (all labs  ordered are listed, but only abnormal results are displayed) Labs Reviewed  COMPREHENSIVE METABOLIC PANEL - Abnormal; Notable for the following:       Result Value   Chloride 99 (*)    Glucose, Bld 123 (*)    BUN 37 (*)    Creatinine, Ser 10.82 (*)    Calcium 8.5 (*)    Albumin 2.9 (*)    ALT 15 (*)    GFR calc non Af Amer 4 (*)    GFR calc Af Amer 5 (*)    All other components within normal limits  CBC WITH DIFFERENTIAL/PLATELET - Abnormal; Notable for the following:    RBC 3.58 (*)    Hemoglobin 10.9 (*)    HCT 33.9 (*)  RDW 16.5 (*)    Lymphs Abs 0.4 (*)    All other components within normal limits  PROTIME-INR - Abnormal; Notable for the following:    Prothrombin Time 17.3 (*)    All other components within normal limits  I-STAT CG4 LACTIC ACID, ED - Abnormal; Notable for the following:    Lactic Acid, Venous 3.60 (*)    All other components within normal limits  CULTURE, BLOOD (ROUTINE X 2)  CULTURE, BLOOD (ROUTINE X 2)  URINALYSIS, ROUTINE W REFLEX MICROSCOPIC    EKG  EKG Interpretation  Date/Time:  Tuesday March 21 2017 14:09:11 EDT Ventricular Rate:  128 PR Interval:    QRS Duration: 84 QT Interval:  334 QTC Calculation: 407 R Axis:   30 Text Interpretation:  Atrial fibrillation with rapid ventricular response Non-specific ST-t changes Confirmed by Ashok Cordia  MD, Lennette Bihari ([id number]) on 03/21/2017 2:11:44 PM       Radiology Dg Chest 2 View  Result Date: 03/21/2017 CLINICAL DATA:  Fever, weakness, cough, shortness of Breath EXAM: CHEST  2 VIEW COMPARISON:  06/23/2015 FINDINGS: Cardiomediastinal silhouette is stable. Mild interstitial prominence bilateral lower lobes may represent mild interstitial edema scarring or pneumonitis. No segmental consolidation. IMPRESSION: Mild interstitial prominence bilateral lower lobes may represent mild interstitial edema scarring or pneumonitis. No segmental consolidation. Electronically Signed   By: Lahoma Crocker M.D.   On: 03/21/2017  15:43    Procedures Procedures (including critical care time)  Medications Ordered in ED Medications  piperacillin-tazobactam (ZOSYN) IVPB 3.375 g (not administered)  sodium chloride 0.9 % bolus 250 mL (not administered)  acetaminophen (TYLENOL) tablet 650 mg (not administered)  vancomycin (VANCOCIN) 2,000 mg in sodium chloride 0.9 % 500 mL IVPB (not administered)  piperacillin-tazobactam (ZOSYN) IVPB 3.375 g (not administered)   Initial Impression / Assessment and Plan / ED Course  I have reviewed the triage vital signs and the nursing notes.  Pertinent labs & imaging results that were available during my care of the patient were reviewed by me and considered in my medical decision making (see chart for details).  Final Clinical Impressions(s) / ED Diagnoses  {I have reviewed and evaluated the relevant laboratory values. {I have reviewed and evaluated the relevant imaging studies. {I have interpreted the relevant EKG. {I have reviewed the relevant previous healthcare records. {I have reviewed EMS Documentation. {I obtained HPI from historian. {Patient discussed with supervising physician.  ED Course:  Assessment: Pt is a 66 y.o. male with hx CHF, ESRD on Dialysis, DM, HTN, HLD who presents with weakness/fatigue s/p Dialysis today. Pt states midway through dialysis he started experiencing tremors as well as weakness. Dialysis was stopped. Sent to ED for eval due to fever of 102F. On exam, pt in Nontoxic/nonseptic appearing. VSS. Afebrile. Lungs CTA. Heart RRR. Abdomen nontender soft. Pt denies cough/congestion. No N/V. No CP/SOB/ABD pain. No dysuria. iStat Lactate 3.6. WBC 4.9. Blood cultures drawn. Given fever, RR, HR, and lactic acid. Code sepsis initiated. Pt was made NPO. Given Vanc/Zosyn abx to cover unknown source. Given NS bolus 533mL. Did not do weight based due to ESRD status. CXR with bilateral pneumonitis. Of note, pt does have bilateral lwoer leg lymphedema, but area appears non  erythematous, non infectious. Plan is to Admit to family medicine. Repeat sepsis assessment completed.  Disposition/Plan:  Admit Pt acknowledges and agrees with plan  Supervising Physician Lajean Saver, MD  Final diagnoses:  Febrile illness  Elevated lactic acid level  ESRD (end stage renal disease) on dialysis ([place])  New Prescriptions New Prescriptions   No medications on file     Shary Decamp, PA-C 03/21/17 [place], MD 03/21/17 [phone]

## 2017-03-21 NOTE — ED Notes (Signed)
Iv attempted witout success

## 2017-03-21 NOTE — ED Notes (Signed)
Pt returns from xray

## 2017-03-21 NOTE — Progress Notes (Signed)
Pharmacy Antibiotic Note  [name] is a 66 y.o. male admitted on 03/21/2017 with sepsis.  Pharmacy has been consulted for vancomycin + zosyn dosing. Transferred from HD 4/10.  Plan: Vancomycin 2g *1 Zosyn 3.375g IV every 12 hours F/u HD schedule to dose vancomycin    Temp (24hrs), Avg:102 F (38.9 C), Min:102 F (38.9 C), Max:102 F (38.9 C)   Recent Labs Lab 03/21/17 1450 03/21/17 1507  WBC 4.9  --   CREATININE 10.82*  --   LATICACIDVEN  --  3.60*    CrCl cannot be calculated (Unknown ideal weight.).    Allergies  Allergen Reactions  . Shrimp [Shellfish Allergy] Swelling  . Iodine Swelling    06/29/2015: Patient stated IV contrast dye and iodine on skin has not caused any reaction.    Antimicrobials this admission: 4/10 Vanc >>  4/10 Zosyn >>   Dose adjustments this admission: NA  Microbiology results:  Thank you for allowing pharmacy to be a part of this patient's care.  Melburn Popper 03/21/2017 3:38 PM

## 2017-03-22 ENCOUNTER — Other Ambulatory Visit (HOSPITAL_COMMUNITY): Payer: Medicare Other

## 2017-03-22 ENCOUNTER — Encounter (HOSPITAL_COMMUNITY): Payer: Self-pay

## 2017-03-22 DIAGNOSIS — R6510 Systemic inflammatory response syndrome (SIRS) of non-infectious origin without acute organ dysfunction: Secondary | ICD-10-CM

## 2017-03-22 DIAGNOSIS — I480 Paroxysmal atrial fibrillation: Secondary | ICD-10-CM

## 2017-03-22 DIAGNOSIS — R748 Abnormal levels of other serum enzymes: Secondary | ICD-10-CM

## 2017-03-22 DIAGNOSIS — D61818 Other pancytopenia: Secondary | ICD-10-CM

## 2017-03-22 LAB — BLOOD CULTURE ID PANEL (REFLEXED)
ACINETOBACTER BAUMANNII: NOT DETECTED
CANDIDA ALBICANS: NOT DETECTED
CANDIDA GLABRATA: NOT DETECTED
CANDIDA PARAPSILOSIS: NOT DETECTED
CANDIDA TROPICALIS: NOT DETECTED
Candida krusei: NOT DETECTED
ENTEROBACTERIACEAE SPECIES: NOT DETECTED
Enterobacter cloacae complex: NOT DETECTED
Enterococcus species: NOT DETECTED
Escherichia coli: NOT DETECTED
HAEMOPHILUS INFLUENZAE: NOT DETECTED
KLEBSIELLA OXYTOCA: NOT DETECTED
KLEBSIELLA PNEUMONIAE: NOT DETECTED
Listeria monocytogenes: NOT DETECTED
NEISSERIA MENINGITIDIS: NOT DETECTED
PROTEUS SPECIES: NOT DETECTED
Pseudomonas aeruginosa: NOT DETECTED
STAPHYLOCOCCUS SPECIES: NOT DETECTED
STREPTOCOCCUS SPECIES: NOT DETECTED
Serratia marcescens: NOT DETECTED
Staphylococcus aureus (BCID): NOT DETECTED
Streptococcus agalactiae: NOT DETECTED
Streptococcus pneumoniae: NOT DETECTED
Streptococcus pyogenes: NOT DETECTED

## 2017-03-22 LAB — BASIC METABOLIC PANEL
ANION GAP: 13 (ref 5–15)
BUN: 49 mg/dL — AB (ref 6–20)
CALCIUM: 8.1 mg/dL — AB (ref 8.9–10.3)
CO2: 24 mmol/L (ref 22–32)
Chloride: 99 mmol/L — ABNORMAL LOW (ref 101–111)
Creatinine, Ser: 11.44 mg/dL — ABNORMAL HIGH (ref 0.61–1.24)
GFR calc Af Amer: 5 mL/min — ABNORMAL LOW (ref >60)
GFR, EST NON AFRICAN AMERICAN: 4 mL/min — AB (ref >60)
GLUCOSE: 144 mg/dL — AB (ref 65–99)
POTASSIUM: 5.1 mmol/L (ref 3.5–5.1)
SODIUM: 136 mmol/L (ref 135–145)

## 2017-03-22 LAB — DIC (DISSEMINATED INTRAVASCULAR COAGULATION) PANEL
APTT: 39 s — AB (ref 24–36)
D DIMER QUANT: 3.91 ug{FEU}/mL — AB (ref 0.00–0.50)
FIBRINOGEN: 726 mg/dL — AB (ref 210–475)
INR: 1.51
PLATELETS: 25 10*3/uL — AB (ref 150–400)
SMEAR REVIEW: NONE SEEN

## 2017-03-22 LAB — HIV ANTIBODY (ROUTINE TESTING W REFLEX): HIV SCREEN 4TH GENERATION: NONREACTIVE

## 2017-03-22 LAB — LACTIC ACID, PLASMA
LACTIC ACID, VENOUS: 1.3 mmol/L (ref 0.5–1.9)
Lactic Acid, Venous: 2.4 mmol/L (ref 0.5–1.9)
Lactic Acid, Venous: 1.5 mmol/L (ref 0.5–1.9)

## 2017-03-22 LAB — CBC
HEMATOCRIT: 29.3 % — AB (ref 39.0–52.0)
Hemoglobin: 9.4 g/dL — ABNORMAL LOW (ref 13.0–17.0)
MCH: 30.5 pg (ref 26.0–34.0)
MCHC: 32.1 g/dL (ref 30.0–36.0)
MCV: 95.1 fL (ref 78.0–100.0)
Platelets: 26 10*3/uL — CL (ref 150–400)
RBC: 3.08 MIL/uL — ABNORMAL LOW (ref 4.22–5.81)
RDW: 16.7 % — AB (ref 11.5–15.5)
WBC: 3.5 10*3/uL — AB (ref 4.0–10.5)

## 2017-03-22 LAB — GLUCOSE, CAPILLARY
GLUCOSE-CAPILLARY: 147 mg/dL — AB (ref 65–99)
Glucose-Capillary: 187 mg/dL — ABNORMAL HIGH (ref 65–99)
Glucose-Capillary: 209 mg/dL — ABNORMAL HIGH (ref 65–99)

## 2017-03-22 LAB — TROPONIN I
Troponin I: 0.46 ng/mL (ref <0.03)
Troponin I: 0.42 ng/mL (ref <0.03)
Troponin I: 0.34 ng/mL (ref <0.03)

## 2017-03-22 LAB — MAGNESIUM: Magnesium: 1.9 mg/dL (ref 1.7–2.4)

## 2017-03-22 LAB — TSH: TSH: 3.436 u[IU]/mL (ref 0.350–4.500)

## 2017-03-22 LAB — RETICULOCYTES
RBC.: 3.01 MIL/uL — ABNORMAL LOW (ref 4.22–5.81)
Retic Count, Absolute: 39.1 10*3/uL (ref 19.0–186.0)
Retic Ct Pct: 1.3 % (ref 0.4–3.1)

## 2017-03-22 LAB — DIC (DISSEMINATED INTRAVASCULAR COAGULATION)PANEL: Prothrombin Time: 18.3 seconds — ABNORMAL HIGH (ref 11.4–15.2)

## 2017-03-22 LAB — LACTATE DEHYDROGENASE: LDH: 154 U/L (ref 98–192)

## 2017-03-22 LAB — VITAMIN B12: VITAMIN B 12: 266 pg/mL (ref 180–914)

## 2017-03-22 LAB — MRSA PCR SCREENING: MRSA BY PCR: NEGATIVE

## 2017-03-22 MED ORDER — SODIUM CHLORIDE 0.9 % IV SOLN
250.0000 mL | INTRAVENOUS | Status: DC | PRN
  Administered 2017-03-27: 250 mL via INTRAVENOUS

## 2017-03-22 MED ORDER — SODIUM CHLORIDE 0.9% FLUSH
3.0000 mL | Freq: Two times a day (BID) | INTRAVENOUS | Status: DC
  Administered 2017-03-22 – 2017-03-28 (×10): 3 mL via INTRAVENOUS

## 2017-03-22 MED ORDER — CALCIUM ACETATE (PHOS BINDER) 667 MG PO CAPS
2001.0000 mg | ORAL_CAPSULE | Freq: Three times a day (TID) | ORAL | Status: DC
Start: 2017-03-22 — End: 2017-03-28
  Administered 2017-03-22 – 2017-03-28 (×16): 2001 mg via ORAL
  Filled 2017-03-22 (×16): qty 3

## 2017-03-22 MED ORDER — DARBEPOETIN ALFA 60 MCG/0.3ML IJ SOSY
60.0000 ug | PREFILLED_SYRINGE | INTRAMUSCULAR | Status: DC
  Administered 2017-03-23: 60 ug via INTRAVENOUS

## 2017-03-22 MED ORDER — CALCITRIOL 0.25 MCG PO CAPS
1.7500 ug | ORAL_CAPSULE | ORAL | Status: DC
  Administered 2017-03-23: 0.75 ug via ORAL
  Administered 2017-03-25 – 2017-03-28 (×3): 1.75 ug via ORAL
  Filled 2017-03-22 (×3): qty 7

## 2017-03-22 MED ORDER — IPRATROPIUM-ALBUTEROL 0.5-2.5 (3) MG/3ML IN SOLN: 3.0000 mL | RESPIRATORY_TRACT | Status: DC | PRN

## 2017-03-22 MED ORDER — DM-GUAIFENESIN ER 30-600 MG PO TB12
1.0000 | ORAL_TABLET | Freq: Two times a day (BID) | ORAL | Status: DC
  Administered 2017-03-22 – 2017-03-28 (×13): 1 via ORAL
  Filled 2017-03-22 (×13): qty 1

## 2017-03-22 MED ORDER — NEPRO/CARBSTEADY PO LIQD
237.0000 mL | Freq: Two times a day (BID) | ORAL | Status: DC
  Administered 2017-03-22 – 2017-03-28 (×5): 237 mL via ORAL

## 2017-03-22 MED ORDER — RENA-VITE PO TABS
1.0000 | ORAL_TABLET | Freq: Every day | ORAL | Status: DC
  Administered 2017-03-22 – 2017-03-27 (×6): 1 via ORAL
  Filled 2017-03-22 (×6): qty 1

## 2017-03-22 MED ORDER — CARVEDILOL 12.5 MG PO TABS
12.5000 mg | ORAL_TABLET | Freq: Two times a day (BID) | ORAL | Status: DC
  Administered 2017-03-22: 12.5 mg via ORAL
  Filled 2017-03-22: qty 1

## 2017-03-22 MED ORDER — SODIUM CHLORIDE 0.9% FLUSH
3.0000 mL | INTRAVENOUS | Status: DC | PRN
Start: 2017-03-22 — End: 2017-03-28

## 2017-03-22 MED ORDER — INSULIN ASPART 100 UNIT/ML ~~LOC~~ SOLN: 0.0000 [IU] | Freq: Three times a day (TID) | SUBCUTANEOUS | Status: DC

## 2017-03-22 NOTE — Progress Notes (Signed)
CRITICAL VALUE ALERT  Critical value received:  Lactic 2.8  Date of notification:  03/21/17  Time of notification:  2210  Critical value read back: yes  Nurse who received alert:  Bing Plume  MD notified (1st page):  [email or web address]  Time of first page:  0002  MD notified (2nd page):  Time of second page:  Responding MD:  [email or web address]  Time MD responded:  0010

## 2017-03-22 NOTE — Progress Notes (Signed)
Family Medicine Teaching Service Daily Progress Note Intern Pager: [phone]  Patient name: [name] Medical record number: [id number] Date of birth: Oct 10, 1951 Age: 66 y.o. Gender: male  Primary Care Provider: Georges Lynch, MD Consultants: Nephro, Cardiology Code Status: Full  Pt Overview and Major Events to Date:  4/10- Admited to FPTS  Assessment and Plan: [name] is a 66 y.o. male presenting with fever and weakness, meeting SIRS criteria. PMH is significant for CHF, ESRD on Dialysis, DM, HTN, HLD, Asthma, morbid obesity, lymphedema, venous insufficiency and MGUS.  Fever, generalized weakness, SIRS with unknown source, resolved: No longer meeting SIRS criteria, however morning labs showed a leukopenia to 3.5, with a neutrophil predominance. VSS otherwise.  O2 remain in high 90's on room air. Physical exam showing normal work of breathing with expiratory wheezes in right upper and lower lung fields.  CXR showed bilateral pneumonitis, no consolidation. Skin exam showing no open wounds, lesions, or catheters or signs of skin infection. Benign abdominal exam.  Labs are also remarkable for decreased Hgb 9.4, rbc 3.08, and platelets 26. Creatinine is worsening from 10.8 yesterday to 11.44 today. Urinalysis showed moderate hemoglobin, 100 protein, moderate leukocytes, negative nitrites, positive bacteria.  Differentials include infectious causes such as pneumonia and pyelonephritis. Non-infectious causes include smoldering multiple myeloma.   - Vital signs per floor protocol - Cardiac monitoring - Blood cultures pending - Obtain UA with urine culture - Vancomycin and Zosyn per pharmacy - Tylenol PRN fevers, zofran PRN nausea - D/c IV fluids, pt is eating and drinking on his own   Questionable Afib with RVR, resolved: EKG in the ED showed afib with RVR. Since admission patient has been on tele and in normal sinus rhythm with a normal rate. TSH normal. Cards was consulted and  recommended we hold anticoagulation because of patient's thrombocytopenia.   Elevated troponin: 0.45>0.46. Patient is currently asymptomatic.  - Continue to trend  - Cardiology consulted, recs as above  - ASA 81 mg - Hold heparin - SCDs   Pancytopenia: WBC 3.5, RBC 3.08, Hgb 9.4, MCV 95.1 Plt 26. Patient currently get HD Tuesday, Thursday and Saturday. However, patient didn't complete his HD yesterday. Patient has a hx of MGUS and smoldering multiple myeloma. Anemia and thrombocytopenia are well known complications of patient's with chronic kidney disease as well as patient on HD.  Etiologies for his pancytopenia include B12 or folate deficiency, smoldering multiple myeloma that has progressed to multiple myeloma (less likely) or this may be dilutional. Other etiologies of  thrombocytopenia include DIC and TTP. - DIC panel  - Peripheral smear  - Folate and B12 level  - haptoglobin, LDH - reticulocyte count  - Hold heparin  - SCDs - If symptoms don't improve, consider heme c/s to further investigate hx of smoldering multiple myeloma   Lactic Acidosis, resovled: Lactic acid 1.3 and WNL  ESRD on HD: Receives HD Tuesday/Thursday/Saturday. Electrolytes stable. Cr 10.8. - Nephrology consulted and will see patient, appreciate assistance - Is on Phoslo at home, unclear about dose of this medication. Will defer dosing to nephrology - Hold home arenesp  Asthma, stable: Wheezes appreciated in right upper and lower lung fields this AM. CXR negative for acute infiltrate. Not on any respiratory medications at home. Respiratory rate improved since being on duobnebs.  - PRN duonebs q4h   Diabetes, stable - HgbA1c most recently 6.8 in 03/13/2017.  No medications as an outpatient. Serum glucose 144 and capillary glucose 104 this AM.  - Continue home  ASA 81  - CBG qAM, qHS, no coverage, can add sliding scale insulin as needed  HTN, stable - Blood pressure stable, but 114/46 this AM. Home regimen  norvasc 10 mg QD, Coreg 25 mg QD, Lasix - Hold home Lasix of 80 mg BID and norvasc, can add back as needed - Decrease home coreg 25 mg BID, to coreg 12.5 BID.   Hx of CHF: Echo on 06/25/2015 showing no signs of CHF with EF of 55%. No signs of fluid overload on exam. - Hold home lasix as above - Continue coreg as above  HLD - Last lipid panel 03/13/2017 - Cholesterol 184, HDL 29, LDL 115, Triglyceride 201 - statin recommended by PCP at last outpatient visit - patient had adamantly refused statin - can continue to counsel - continue ASA 81 QD  Morbid Obesity - weight loss counseling  Lymphedema: No signs of edema on exam but does have significant skin changes. - Consider adding cream to help with chronic skin changes  FEN/GI: Renal diet, SLIV PPx: SCDs  Disposition: Pending antibiotic treatment and sepsis workup  Subjective:  [name] was doing well this AM. Denies any fevers, chills or pain today. Reports that he is not experiencing any SOB, CP, N/V/D or abnormal bruising or bleeding. No complaints today.   Objective: Temp:  [98.6 F (37 C)-102 F (38.9 C)] 99 F (37.2 C) (04/11 0540) Pulse Rate:  [66-91] 71 (04/11 0540) Resp:  [17-32] 18 (04/11 0540) BP: (70-154)/(40-104) 114/46 (04/11 0540) SpO2:  [90 %-100 %] 96 % (04/11 0540) Weight:  [122.7 kg (270 lb 8.1 oz)] 122.7 kg (270 lb 8.1 oz) (04/11 0540) Physical Exam: General: Well appearing, NAD, lying in bed  Cardiovascular: RRR, no MRG Respiratory: Minimal wheezing appreciated in the right upper and lower lobes. No increased work of breathing.   Abdomen: Normoactive bowel sounds. No TTP. Exam is limited due to body habitus Extremities: BL 5th toe amputations. HD fistula on the L arm. No erythema, edema, increased warmth or discharge present.   Laboratory:  Recent Labs Lab 03/21/17 1450 03/22/17 0025  WBC 4.9 3.5*  HGB 10.9* 9.4*  HCT 33.9* 29.3*  PLT 63* 26*    Recent Labs Lab 03/21/17 1450 03/22/17 0025   NA 138 136  K 4.1 5.1  CL 99* 99*  CO2 26 24  BUN 37* 49*  CREATININE 10.82* 11.44*  CALCIUM 8.5* 8.1*  PROT 7.0  --   BILITOT 1.1  --   ALKPHOS 57  --   ALT 15*  --   AST 29  --   GLUCOSE 123* 144*   TSH: 3.436 MRSA by PCR: Negative    Imaging/Diagnostic Tests: Dg Chest 2 View  Result Date: 03/21/2017 CLINICAL DATA:  Fever, weakness, cough, shortness of Breath EXAM: CHEST  2 VIEW COMPARISON:  06/23/2015 FINDINGS: Cardiomediastinal silhouette is stable. Mild interstitial prominence bilateral lower lobes may represent mild interstitial edema scarring or pneumonitis. No segmental consolidation. IMPRESSION: Mild interstitial prominence bilateral lower lobes may represent mild interstitial edema scarring or pneumonitis. No segmental consolidation. Electronically Signed   By: Lahoma Crocker M.D.   On: 03/21/2017 15:43    Colon Flattery, Medical Student 03/22/2017, 7:12 AM  ________________________________________________________________________ I agree with the medical student's note above, with notable exceptions in the assessment and plan which I have given below.   [name] is a 66 y.o., here with fever and weakness, meeting SIRS criteria. Source is currently unclear. Questionable diagnosis of new Afib on  ED EKG, however has been NSR on telemetry. PMH is significant for diastolic CHF, ESRD on Dialysis, MGUS, DM, HTN, HLD, Asthma, morbid obesity, lymphedema, and venous insufficiency  Fever of Uncertain Source, meeting SIRS Criteria: Vitals are stable. UA with moderate Leukocytes but 6-30 squamous cells. Symptoms of urinary frequency therefore possibly has UTI. Should possibly consider non-infectious causes of fever. Patient does have history of MGUS and smoldering multiple myeloma.  - Vancomycin and Zosyn - follow blood cultures and urine culture  Lactic Acidosis: resolved. See above plan  Questionable New Afib, resolved: noted in EKG obtained in ED, however, vitals with  HR within normal limits since he was in the ED. CHADSVASC 4. Per cards, no anticoagulation in the setting of thrombocytopenia. TSH normal - cards consult   Elevated troponin: 0.45 >0.46 - trend troponins - cards consult as above - ASA 81 mg daily  - ECHO  Pancytopenia, acute: wbc 3.5, hgb 9.4, plt 26. Possibly due to this uncertain infectious source. Possibly dilutional as patient had to stop HD early yesterday and did receive small bolus in the ED. Labs were normal in 02/2017.  Also has a history of MGUS with smoldering multiple myeloma. Last seen by oncology in 06/2014 and patient was lost to follow up per chart review.   - peripheral smear is pending  - DIC panel  - HIV in process - haptoglobin, LDH  - reticulocytes - SCDs  - heme-onc consult - HIT panel  ESRD on HD:  - nephrology consulted  Asthma: no sign of exacerbation - PRN duonebs  DM2: HgbA1c most recently 6.8 in 03/13/2017. Per chart review, diet controlled since starting dialysis - CBGs ACHS - will add SSI if appropriate.   HTN - home regimen norvasc 10 mg QD, Coreg 25 mg QD, Lasix - Hold home Lasix of 80 mg BID and norvasc in the setting of sepsis, can add back as needed - decrease dose of coreg to 12.5 mg BID in the setting of sepsis   Hx of CHF: Echo on 06/25/2015 showing no signs of CHF with EF of55%. No signs of fluid overload on exam. - Hold home lasix as above - Continue coreg as above   HLD - Last lipid panel 03/13/2017 - Cholesterol 184, HDL 29, LDL 115, Triglyceride 201 - statin recommended by PCP at last outpatient visit - patient had adamantly refused statin - can continue to counsel - continue ASA 81 QD  Morbid Obesity - weight loss counseling  Lymphedema: No signs of edema on exam but does have significant skin changes. - Consider adding cream to help with chronic skin changes  MGUS with smoldering multiple myeloma: Last seen by oncology in 06/2014 and patient was lost to follow up per  chart review.   - SPEP, UPEP   HPI: Patient doing well today. No concerns. Denies cough, shortness of breath, chest pain, or palpitations. No chills or fevers. Denies any recent illness other than increased urinary frequency in the past 3 days. No sick contacts. No IV drug use. Not sexually active.   Physical Exam GEN: NAD, obese man sitting on the side of bed. Non-toxic appearing HEENT: Atraumatic, normocephalic, neck supple, EOMI, sclera clear  CV: RRR, no murmurs, rubs, or gallops PULM: CTAB, normal effort ABD: Soft, nontender, nondistended, NABS, no organomegaly. No CVA tenderness. EXTR: chronic lymphedema with skin changes bilaterally  PSYCH: Mood and affect euthymic, normal rate and volume of speech NEURO: Awake, alert, no focal deficits grossly, normal speech  Onnie Boer,  MD Family Medicine, PGY 2 03/22/2017 8:38 AM

## 2017-03-22 NOTE — Consult Note (Signed)
[facility] Renal Consultation Note    Indication for Consultation:  Management of ESRD/hemodialysis; anemia, hypertension/volume and secondary hyperparathyroidism PCP: Georges Lynch, MD  HPI: [name] is a 66 y.o. male with ESRD secondary to DM vs LCDD on TTS at [facility] who started dialysis July 2016 with a history of HTN, CHF, DM, MGUS obesity, dialysis noncompliance, PVD who  hhad missed two treatments and presented yesterday at dialysis afebrile with a pre HD weight of 122 (EDW 116) he dialyzed 3 hr and 20 min and post HD wt was 12.5 pre HD BP was 159/80 and 190/84. He said he felt fine prior to dialysis.  At dialysis he reported  chills but no fevers. No cough, CP, SOB, sick contacts.  He felt bad with shaking chills and came off early but was too weak to walk to his car with a relative low BP 120/56.  He remained at the center for a while, did not improve.  Temp ultimately spiked to 101.6.  He was taken to the ED for evaluation.  Temp here was 102 with BP 70/44.  He was given a liter of IVF.   WBC 4.9 Platelets 63 hgb 10.9 Chemistries unremarkable. Urine TNTC RBC and WBC, neg nitrite, many bact. Blood cultures are pending. He had had a recent office visit with PCP 4/2 for f/u of diarrhea and was Cdiff neg at that time. His was started on PPI x 1 month. CXR yesterday showed mild interstitial edmea, scarry on pneumonitis. No consolidation.  AFter cultures, he was started on empiric antibiotics.  Today WBC is down to 3.5 and platelets are down to 26 K. Hgb 9.4 not much different from outpt hgb  Lactic acid has improved to 1.3 trop 0.46 flat. Feels weak today. Didn't eat last night but did eat today. Had loose BM, hematuria with dysuria he thinks secondary to traumatic cath yesterday.  No, SOB, a little cough no HA.  n the days he misses dialysis, he states he doesn't go because he "feels bad."  Past Medical History:  Diagnosis Date  . Arthritis   . Asthma    as a child  . CHF  (congestive heart failure) ([place])   . Chronic cystitis   . Chronic kidney disease   . Diabetes mellitus   . Dry skin   . H/O hiatal hernia    states it's been fixed  . Hyperlipidemia   . Hypertension   . Lymphedema   . Morbid obesity ([place])   . NEPHROLITHIASIS, HX OF 12/02/2009   Qualifier: Diagnosis of  By: [name], Cat    . PVD (peripheral vascular disease) ([place])   . UMBILICAL HERNIA 0/25/4270   Qualifier: History of  By: [name] MD, Audelia Acton    . Venous insufficiency    Past Surgical History:  Procedure Laterality Date  . AMPUTATION     Right and left fifth toes.   . AV FISTULA PLACEMENT Left 03/21/2014   Procedure: ARTERIOVENOUS (AV) FISTULA CREATION with ultrasound;  Surgeon: [name], MD;  Location: [facility];  Service: Vascular;  Laterality: Left;  . COLONOSCOPY    . EYE SURGERY Bilateral    cataract and lens implant  . HERNIA REPAIR    . LIGATION OF COMPETING BRANCHES OF ARTERIOVENOUS FISTULA Left 06/29/2015   Procedure: LIGATION OF LEFT ARM RADIOCEPHALIC ARTERIOVENOUS FISTULA SIDE BRANCHES;  Surgeon: Conrad Gray, MD;  Location: [facility];  Service: Vascular;  Laterality: Left;  . Popliteal to posterior tibial bypass  2006  . R knee arthoscopic repair of meniscus    . UMBILICAL HERNIA REPAIR     Family History  Problem Relation Age of Onset  . Diabetes Mother   . Heart disease Mother   . Diabetes Brother    Social History:  reports that he quit smoking about 37 years ago. He has quit using smokeless tobacco. He reports that he does not drink alcohol or use drugs. Allergies  Allergen Reactions  . Iodine Swelling    06/29/2015: Patient stated IV contrast dye and iodine on skin has not caused any reaction.  . Shrimp [Shellfish Allergy] Swelling  . Contrast Media [Iodinated Diagnostic Agents] Swelling   Prior to Admission medications   Medication Sig Start Date End Date Taking? Authorizing Provider  amLODipine (NORVASC) 10 MG tablet Take 10 mg by mouth every morning.    Yes Historical Provider, MD  calcium acetate (PHOSLO) 667 MG capsule [id number] mg.  07/08/15  Yes Historical Provider, MD  carvedilol (COREG) 25 MG tablet Take 1 tablet (25 mg total) by mouth 2 (two) times daily with a meal. 03/11/14  Yes Frazier Richards, MD  furosemide (LASIX) 80 MG tablet Take 1 tablet (80 mg total) by mouth 2 (two) times daily. 06/15/16  Yes [name], MD  aspirin EC 81 MG tablet Take 81 mg by mouth every morning.    Historical Provider, MD   Current Facility-Administered Medications  Medication Dose Route Frequency Provider Last Rate Last Dose  . acetaminophen (TYLENOL) tablet 650 mg  650 mg Oral Q6H PRN [name], MD       Or  . acetaminophen (TYLENOL) suppository 650 mg  650 mg Rectal Q6H PRN [name], MD      . aspirin EC tablet 81 mg  81 mg Oral q morning - 10a [name], MD   81 mg at 03/22/17 0840  . carvedilol (COREG) tablet 12.5 mg  12.5 mg Oral BID WC Smiley Houseman, MD      . dextromethorphan-guaiFENesin [facility] DM) 30-600 MG per 12 hr tablet 1 tablet  1 tablet Oral BID Lupita Dawn, MD   1 tablet at 03/22/17 0840  . ipratropium-albuterol (DUONEB) 0.5-2.5 (3) MG/3ML nebulizer solution 3 mL  3 mL Nebulization Q4H PRN Lupita Dawn, MD      . ondansetron [facility]) tablet 4 mg  4 mg Oral Q6H PRN [name], MD       Or  . ondansetron [facility]) injection 4 mg  4 mg Intravenous Q6H PRN [name], MD      . piperacillin-tazobactam (ZOSYN) IVPB 3.375 g  3.375 g Intravenous Q12H Melburn Popper, RPH   3.375 g at 03/22/17 0321  . polyethylene glycol (MIRALAX / GLYCOLAX) packet 17 g  17 g Oral Daily PRN [name], MD       Labs: Basic Metabolic Panel:  Recent Labs Lab 03/21/17 1450 03/22/17 0025  NA 138 136  K 4.1 5.1  CL 99* 99*  CO2 26 24  GLUCOSE 123* 144*  BUN 37* 49*  CREATININE 10.82* 11.44*  CALCIUM 8.5* 8.1*   Liver Function Tests:  Recent Labs Lab 03/21/17 1450  AST 29  ALT 15*   ALKPHOS 57  BILITOT 1.1  PROT 7.0  ALBUMIN 2.9*   CBC:  Recent Labs Lab 03/21/17 1450 03/22/17 0025 03/22/17 0922  WBC 4.9 3.5*  --   NEUTROABS 4.4  --   --   HGB  10.9* 9.4*  --   HCT 33.9* 29.3*  --   MCV 94.7 95.1  --   PLT 63* 26* PENDING   Cardiac Enzymes:  Recent Labs Lab 03/21/17 2247 03/22/17 0640  TROPONINI 0.45* 0.46*   CBG:  Recent Labs Lab 03/21/17 2254  GLUCAP 104*   Studies/Results: Dg Chest 2 View  Result Date: 03/21/2017 CLINICAL DATA:  Fever, weakness, cough, shortness of Breath EXAM: CHEST  2 VIEW COMPARISON:  06/23/2015 FINDINGS: Cardiomediastinal silhouette is stable. Mild interstitial prominence bilateral lower lobes may represent mild interstitial edema scarring or pneumonitis. No segmental consolidation. IMPRESSION: Mild interstitial prominence bilateral lower lobes may represent mild interstitial edema scarring or pneumonitis. No segmental consolidation. Electronically Signed   By: Lahoma Crocker M.D.   On: 03/21/2017 15:43    ROS: As per HPI otherwise negative.  Physical Exam: Vitals:   03/21/17 2145 03/21/17 2211 03/22/17 0233 03/22/17 0540  BP: (!) 143/54 (!) 154/50  (!) 114/46  Pulse: 69 72 74 71  Resp: (!) 21 18 18 18   Temp:  98.6 F (37 C)  99 F (37.2 C)  TempSrc:    Oral  SpO2: 96% 99%  96%  Weight:    122.7 kg (270 lb 8.1 oz)  Height:    5\' 11"  (1.803 m)     General:  Supine in bed on room air breathing easily Head: NCAT sclera not icteric MMM Neck: Supple.  Lungs: CTA bilaterally without wheezes, rales, or rhonchi. Breathing is unlabored. Heart: RRR  Abdomen: obese protruberant soft NT + BS Lower extremities: chronic thickened skin, some peeling of with slight edema R>L Neuro: A & O  X 3. Moves all extremities spontaneously. Psych:  Responds to questions appropriately with a normal affect. Dialysis Access: left lower AVF + bruit  Dialysis Orders:  TTS [place] 4 hr 450/800 EDW 116 3 K 2.25 Ca profile 4 left lower AVF  heparin 6000 Mircera 75 q 4 weeks - last dose 4/5 Calcitriol 1.75  Recent labs: hgb 10 trending down 38% sat iPTH 622 P ^^  Post HD wts 4/5 118 4/10   Assessment/Plan: 1. Sepsis/SIRS- unclear fever source; cultures pending /empiric antibiotics, HIV pending; workup per primary 2. ESRD -  TTS- next HD Thursday- K .5.1 - had been on 3 K bath as outpt - given skipped tmts - he probably needs 2K bath at d/c 3. Hypertension/volume  - reassess EDW while in hosp - Had low HR noted late March at dialysis BB lowered to 12.5 bid; also was supposed to have been on norvasc- off for now titrate EDW while here; start with a 3 L goal tomorrow 4. Anemia  -hgb 9.4  ^ aranesp to 60 per week while here last tsat ok 5. Metabolic bone disease -  Calcitriol/phoslo binders- takes 4-5 ac- will start with 3 ac while here 6. Nutrition -renal diet/add vits/nepro 7. Noncompliance with HD- attends about twice a week, sometimes only once a week; tells me he misses dialysis because he feels bad 8. + trop - likely stress induced 9. MGUS/smolderling MM  -last Onc visit 06/2014- he did not reschedule f/u  appointment.  Oncology note indicates he ws told that her had smoldering MM and was at risk of converting to full blown MM without treatment. 10. Panocytopenia - last outpt platelets wre 159 03/09/17 - down to 26 K today - no heparin HD- WBC down some. Work up per primary. Needs heme-onc consult  Myriam Jacobson, PA-C [facility]  Beeper [phone] 03/22/2017, 10:42 AM   I have seen and examined this patient and agree with plan per Amalia Hailey.  67yo BM with ESRD who became acutely ill at HD yest with chills and then spiked a fever.  No obvious source but skin on his legs is quite dry and cracked with chronic venous stasis changes.  Has hx of "smoldering myeloma" and has not kept FU appts for some time.  Agree with cultures and empiric AB.  Hematology should see him this hospitalization to re evaluate the myeloma.   Plan HD tomorrow.  DC IV fluids Tarrence Enck T,MD 03/22/2017 12:00 PM

## 2017-03-22 NOTE — Progress Notes (Signed)
Documentation  Family Medicine Teaching Service paged on call medical oncologist, Dr. Talbert Cage, for a consult at [phone] (# per [facility]) five times today with no response. Spoke with Cyril Mourning at [facility] and a page was sent out to Dr. Talbert Cage. Awaiting response.   Gorden Harms. Leonie Green, Medical Student

## 2017-03-22 NOTE — Progress Notes (Signed)
CRITICAL VALUE ALERT  Critical value received:  Platelets 26  Date of notification:  03/22/2017  Time of notification:  0122  Critical value read back:Yes.    Nurse who received alert:  Clydell Hakim  MD notified (1st page):  On-call Family Medicine  Time of first page:  0124  MD notified (2nd page):  Time of second page:  Responding MD:    Time MD responded:

## 2017-03-22 NOTE — Progress Notes (Signed)
Pt was admitted from ED per stretcher accompanied by nurse tech and pt family, he was fully alert and oriented on arrival to the floor, self introduced to pt and family ID bracelet checked pt oriented to room and pt care equipment, fall risk and skin assessment done pt being feed prescribed treatment started call light and phone within reach and pt able to demonstrate how to use them, will continue to monitor pt

## 2017-03-22 NOTE — Progress Notes (Signed)
CRITICAL VALUE ALERT  Critical value received: troponin 0.45  Date of notification:  03/21/17  Time of notification:  2359  Critical value read back: yes  Nurse who received alert:  Bing Plume  MD notified (1st page):  [email or web address]  Time of first page: 0002  MD notified (2nd page):  Time of second page:  Responding MD: [email or web address]  Time MD responded:  0010

## 2017-03-22 NOTE — Consult Note (Signed)
Cardiology Consult    Patient ID: [name] MRN: [id number], DOB/AGE: 68/05/1951   Admit date: 03/21/2017 Date of Consult: 03/22/2017  Primary Physician: Georges Lynch, MD Primary Cardiologist: Dr. Percival Spanish Requesting Provider: Dr. Ree Kida  Reason for Consult: elevated troponin  Patient Profile    [name] is a 66 yo male with a PMH significant for DM, HTN, HLD, morbid obesity, atherosclerosis of native arteries in extremities, ESRD on HD, CHF, and venous stasis ulcers of lower extremities. He was brought to [facility] from dialysis for upper extremity tremors.  [name] is a 66 y.o. male who is being seen today for the evaluation of elevated troponin at the request of Dr. Ree Kida.   Past Medical History   Past Medical History:  Diagnosis Date  . Arthritis   . Asthma    as a child  . CHF (congestive heart failure) ([place])   . Chronic cystitis   . Chronic kidney disease   . Diabetes mellitus   . Dry skin   . H/O hiatal hernia    states it's been fixed  . Hyperlipidemia   . Hypertension   . Lymphedema   . Morbid obesity ([place])   . NEPHROLITHIASIS, HX OF 12/02/2009   Qualifier: Diagnosis of  By: [name], Cat    . PVD (peripheral vascular disease) ([place])   . UMBILICAL HERNIA 3/53/2992   Qualifier: History of  By: [name] MD, Audelia Acton    . Venous insufficiency     Past Surgical History:  Procedure Laterality Date  . AMPUTATION     Right and left fifth toes.   . AV FISTULA PLACEMENT Left 03/21/2014   Procedure: ARTERIOVENOUS (AV) FISTULA CREATION with ultrasound;  Surgeon: [name], MD;  Location: [place];  Service: Vascular;  Laterality: Left;  . COLONOSCOPY    . EYE SURGERY Bilateral    cataract and lens implant  . HERNIA REPAIR    . LIGATION OF COMPETING BRANCHES OF ARTERIOVENOUS FISTULA Left 06/29/2015   Procedure: LIGATION OF LEFT ARM RADIOCEPHALIC ARTERIOVENOUS FISTULA SIDE BRANCHES;  Surgeon: Conrad Delaware, MD;  Location: [place];  Service: Vascular;  Laterality:  Left;  . Popliteal to posterior tibial bypass     2006  . R knee arthoscopic repair of meniscus    . UMBILICAL HERNIA REPAIR       Allergies  Allergies  Allergen Reactions  . Iodine Swelling    06/29/2015: Patient stated IV contrast dye and iodine on skin has not caused any reaction.  . Shrimp [Shellfish Allergy] Swelling  . Contrast Media [Iodinated Diagnostic Agents] Swelling    History of Present Illness    [name] was at his regular HD session 03/21/17 when he began to have upper extremity tremors.  He completed half of his HD session and was found to have a temp of 102.  On my interview, he denies chest pain, palpitations, SOB, DOE, orthopnea, nausea and vomiting. He states he discontinued HD yesterday because he started shaking in his arms and hands. He was disconnected and sat in the corner of the facility because he was too weak to walk to his car. He tried to get up from the chair, but the chair rolled away and he fell. No injuries and no pain reported.  EMS was dispatched and brought him to [facility]. Code sepsis was initiated and he was started on vanc/zosyn. He denies sick contacts and recent illness. CXR shows bilateral pneumonitis.   He was found to  be in rate controlled atrial fibrillation. Home coreg was started at lower dose. He now has bouts of NSR and Afib on telemetry with HR in the 50s. He continues to deny any pain and has no complaints with the exception of "feeling weak."   Inpatient Medications    . aspirin EC  81 mg Oral q morning - 10a  . carvedilol  12.5 mg Oral BID WC  . dextromethorphan-guaiFENesin  1 tablet Oral BID  . piperacillin-tazobactam (ZOSYN)  IV  3.375 g Intravenous Q12H     Outpatient Medications    Prior to Admission medications   Medication Sig Start Date End Date Taking? Authorizing Provider  amLODipine (NORVASC) 10 MG tablet Take 10 mg by mouth every morning.   Yes Historical Provider, MD  calcium acetate (PHOSLO) 667 MG capsule  [phone] mg.  07/08/15  Yes Historical Provider, MD  carvedilol (COREG) 25 MG tablet Take 1 tablet (25 mg total) by mouth 2 (two) times daily with a meal. 03/11/14  Yes Frazier Richards, MD  furosemide (LASIX) 80 MG tablet Take 1 tablet (80 mg total) by mouth 2 (two) times daily. 06/15/16  Yes [name], MD  aspirin EC 81 MG tablet Take 81 mg by mouth every morning.    Historical Provider, MD     Family History    Family History  Problem Relation Age of Onset  . Diabetes Mother   . Heart disease Mother   . Diabetes Brother     Social History    Social History   Social History  . Marital status: Legally Separated    Spouse name: N/A  . Number of children: 1  . Years of education: N/A   Occupational History  .  Disabled    Social History Main Topics  . Smoking status: Former Smoker    Quit date: 06/06/1979  . Smokeless tobacco: Former Systems developer  . Alcohol use No     Comment:   "years ago" , none now  . Drug use: No  . Sexual activity: No   Other Topics Concern  . Not on file   Social History Narrative   Lives alone.  Only son is deceased.      Review of Systems    General:  No chills, fever, night sweats or weight changes.  Cardiovascular:  No chest pain, dyspnea on exertion, edema, orthopnea, palpitations, paroxysmal nocturnal dyspnea. Dermatological: No rash, lesions/masses Respiratory: No cough, dyspnea Urologic: No hematuria, dysuria Abdominal:   No nausea, vomiting, diarrhea, bright red blood per rectum, melena, or hematemesis Neurologic:  No visual changes, wkns, changes in mental status. All other systems reviewed and are otherwise negative except as noted above.  Physical Exam    Blood pressure (!) 114/46, pulse 71, temperature 99 F (37.2 C), temperature source Oral, resp. rate 18, height 5\' 11"  (1.803 m), weight 270 lb 8.1 oz (122.7 kg), SpO2 96 %.  General: Pleasant, NAD Psych: Normal affect. Neuro: Alert and oriented X 3. Moves all extremities  spontaneously. HEENT: Normal  Neck: Supple without bruits or JVD. Lungs:  Resp regular and unlabored, CTA. Heart: RRR no s3, s4, or murmurs. Abdomen: Soft, non-tender, non-distended, BS + x 4.  Extremities: No clubbing, cyanosis or edema. DP/PT/Radials 2+ and equal bilaterally.  Labs    Troponin (Point of Care Test) No results for input(s): TROPIPOC in the last 72 hours.  Recent Labs  03/21/17 2247 03/22/17 0640  TROPONINI 0.45* 0.46*   Lab Results  Component Value  Date   WBC 3.5 (L) 03/22/2017   HGB 9.4 (L) 03/22/2017   HCT 29.3 (L) 03/22/2017   MCV 95.1 03/22/2017   PLT 25 (LL) 03/22/2017    Recent Labs Lab 03/21/17 1450 03/22/17 0025  NA 138 136  K 4.1 5.1  CL 99* 99*  CO2 26 24  BUN 37* 49*  CREATININE 10.82* 11.44*  CALCIUM 8.5* 8.1*  PROT 7.0  --   BILITOT 1.1  --   ALKPHOS 57  --   ALT 15*  --   AST 29  --   GLUCOSE 123* 144*   Lab Results  Component Value Date   CHOL 184 03/13/2017   HDL 29 (L) 03/13/2017   LDLCALC 115 (H) 03/13/2017   TRIG 201 (H) 03/13/2017   Lab Results  Component Value Date   DDIMER 3.91 (H) 03/22/2017     Radiology Studies    Dg Chest 2 View  Result Date: 03/21/2017 CLINICAL DATA:  Fever, weakness, cough, shortness of Breath EXAM: CHEST  2 VIEW COMPARISON:  06/23/2015 FINDINGS: Cardiomediastinal silhouette is stable. Mild interstitial prominence bilateral lower lobes may represent mild interstitial edema scarring or pneumonitis. No segmental consolidation. IMPRESSION: Mild interstitial prominence bilateral lower lobes may represent mild interstitial edema scarring or pneumonitis. No segmental consolidation. Electronically Signed   By: Lahoma Crocker M.D.   On: 03/21/2017 15:43    ECG & Cardiac Imaging    Echocardiogram 03/22/17: pending   EKG 03/21/17: Afib, rate controlled   Echocardiogram 06/25/15: Study Conclusions - Left ventricle: The cavity size was mildly dilated. Wall   thickness was increased in a pattern of  mild LVH. The estimated   ejection fraction was 55%. Doppler parameters are consistent with   elevated ventricular end-diastolic filling pressure. - Atrial septum: No defect or patent foramen ovale was identified.   Myoview 01/01/14:  Low risk stress nuclear study Fixed defect in the inferior wall most consistent with diaphragmatic attenuation. LV Ejection Fraction: 51%.  LV Wall Motion:  Low normal LVF    Assessment & Plan    1. Elevated troponin - 0.45 --> 0.46 - troponin negative on 03/11/17 - elevated D dimer to 3.91 - recommend VQ scan for PE given his elevated D dimer and HD status, although he does not have SOB and is maintaining well on room air. - continue ASA, coreg 12.5 bid   - on telemetry, pt seems to be having bouts of NSR and Afib in the 50s - will repeat EKG - home meds include norvasc, coreg 25 mg bid, and lasix 80 mg BID - will repeat echo to evaluate valves and function; will also discuss inpatient myoview with attending. This elevated troponin may be secondary to possible DIC (thrombocytopenia and elevated D dimer)   2. New onset atrial fibrillation - This patients CHA2DS2-VASc Score and unadjusted Ischemic Stroke Rate (% per year) is equal to 7.2 % stroke rate/year from a score of 5 (CHF, HTN, DM, Vascular disease, age) - heparin has not been started secondary to thrombocytopenia - PLT: 25, question myeloma - atrial fibrillation is currently rate controlled and he is tolerating the rhythm well - would hold off on coumadin while platelets recover - K and TSH WNL, Mg pending   3. Fever of unknown origin - vanc/zosyn per primary team - blood cultures pending   4. Chronic diastolic heart failure - repeat echo pending - pt dry wt difficult to determine given HD status; 270 lbs on admission - he has lower  extremity edema, but per the patient he is at baseline - he does not appear extremely volume overloaded on exam - restart lasix per  nephrology    Signed, Tami Lin Duke, PA-C 03/22/2017, 10:56 AM [phone]  Agree with note by Fabian Sharp PA-C  Asked to see patient [name] by the primary care service for evaluation of mildly elevated. He has seen Dr. Percival Spanish in the past. He has chronic renal insufficiency on hemodialysis with. He has preserved LV function with history of diastolic dysfunction. troponin. He developed a "shaking sensation" in the middle of dialysis with a feeling of coldness. He is brought to the ER where he was found to be in A. fib with RVR. He has converted to sinus rhythm with episodes of PAF and PACs. He denies chest pain. His laboratory exam is remarkable for severe thrombocytopenia with a platelet count 25,000, mildly elevated troponin of 0.45 and a mildly elevated d-dimer. His EKG shows no acute changes. His exam is benign. I do not think this represents acute coronary syndrome. His troponin can be explained by his chronic renal insufficiency. I'm not sure the etiology of his elevated d-dimer but clearly is not a candidate for systemic anticoagulation given his trauma cytopenia. They be related to DIC versus sepsis versus a poorly myeloproliferative proliferative disorder. His rate is controlled currently. No further workup is recommended other than a 2-D echo which we will order. We will we will be happy to see back as needed.  Lorretta Harp, M.D., [place], [facility], [place] [address] [place], [place]  [address]  [phone] 03/22/2017 12:49 PM

## 2017-03-22 NOTE — Progress Notes (Signed)
CRITICAL VALUE ALERT  Critical value received:  Lactic 2.4  Date of notification:  03/22/17  Time of notification:  0134  Critical value read back: yes  Nurse who received alert:  Bing Plume  MD notified (1st page):  [email or web address]  Time of first page:  0142  MD notified (2nd page):  Time of second page:  Responding MD:    Time MD responded:

## 2017-03-22 NOTE — Progress Notes (Signed)
PHARMACY - PHYSICIAN COMMUNICATION CRITICAL VALUE ALERT - BLOOD CULTURE IDENTIFICATION (BCID)  Results for orders placed or performed during the hospital encounter of 03/21/17  Blood Culture ID Panel (Reflexed) (Collected: 03/21/2017  2:55 PM)  Result Value Ref Range   Enterococcus species NOT DETECTED NOT DETECTED   Listeria monocytogenes NOT DETECTED NOT DETECTED   Staphylococcus species NOT DETECTED NOT DETECTED   Staphylococcus aureus NOT DETECTED NOT DETECTED   Streptococcus species NOT DETECTED NOT DETECTED   Streptococcus agalactiae NOT DETECTED NOT DETECTED   Streptococcus pneumoniae NOT DETECTED NOT DETECTED   Streptococcus pyogenes NOT DETECTED NOT DETECTED   Acinetobacter baumannii NOT DETECTED NOT DETECTED   Enterobacteriaceae species NOT DETECTED NOT DETECTED   Enterobacter cloacae complex NOT DETECTED NOT DETECTED   Escherichia coli NOT DETECTED NOT DETECTED   Klebsiella oxytoca NOT DETECTED NOT DETECTED   Klebsiella pneumoniae NOT DETECTED NOT DETECTED   Proteus species NOT DETECTED NOT DETECTED   Serratia marcescens NOT DETECTED NOT DETECTED   Haemophilus influenzae NOT DETECTED NOT DETECTED   Neisseria meningitidis NOT DETECTED NOT DETECTED   Pseudomonas aeruginosa NOT DETECTED NOT DETECTED   Candida albicans NOT DETECTED NOT DETECTED   Candida glabrata NOT DETECTED NOT DETECTED   Candida krusei NOT DETECTED NOT DETECTED   Candida parapsilosis NOT DETECTED NOT DETECTED   Candida tropicalis NOT DETECTED NOT DETECTED    -4/10: 1 of 2 blood cultures showing GPC in chains and pairs  Changes to prescribed antibiotics required: NO changes. MD will re-order blood cultures  Hildred Laser, Pharm D 03/22/2017 8:33 PM

## 2017-03-23 ENCOUNTER — Inpatient Hospital Stay (HOSPITAL_COMMUNITY): Payer: Medicare Other

## 2017-03-23 DIAGNOSIS — D696 Thrombocytopenia, unspecified: Secondary | ICD-10-CM

## 2017-03-23 DIAGNOSIS — R509 Fever, unspecified: Secondary | ICD-10-CM

## 2017-03-23 DIAGNOSIS — R310 Gross hematuria: Secondary | ICD-10-CM

## 2017-03-23 LAB — URINE CULTURE: Culture: <10000 — AB

## 2017-03-23 LAB — PROTEIN ELECTROPHORESIS, SERUM
A/G RATIO SPE: 0.8 (ref 0.7–1.7)
ALPHA-1-GLOBULIN: 0.3 g/dL (ref 0.0–0.4)
ALPHA-2-GLOBULIN: 0.8 g/dL (ref 0.4–1.0)
Albumin ELP: 2.6 g/dL — ABNORMAL LOW (ref 2.9–4.4)
BETA GLOBULIN: 0.6 g/dL — AB (ref 0.7–1.3)
GAMMA GLOBULIN: 1.4 g/dL (ref 0.4–1.8)
GLOBULIN, TOTAL: 3.1 g/dL (ref 2.2–3.9)
M-SPIKE, %: 0.8 g/dL — AB
Total Protein ELP: 5.7 g/dL — ABNORMAL LOW (ref 6.0–8.5)

## 2017-03-23 LAB — HAPTOGLOBIN: HAPTOGLOBIN: 162 mg/dL (ref 34–200)

## 2017-03-23 LAB — GLUCOSE, CAPILLARY
GLUCOSE-CAPILLARY: 172 mg/dL — AB (ref 65–99)
GLUCOSE-CAPILLARY: 208 mg/dL — AB (ref 65–99)
Glucose-Capillary: 166 mg/dL — ABNORMAL HIGH (ref 65–99)

## 2017-03-23 LAB — BASIC METABOLIC PANEL
Anion gap: 13 (ref 5–15)
BUN: 68 mg/dL — ABNORMAL HIGH (ref 6–20)
CALCIUM: 8.7 mg/dL — AB (ref 8.9–10.3)
CO2: 25 mmol/L (ref 22–32)
Chloride: 96 mmol/L — ABNORMAL LOW (ref 101–111)
Creatinine, Ser: 12.85 mg/dL — ABNORMAL HIGH (ref 0.61–1.24)
GFR calc non Af Amer: 3 mL/min — ABNORMAL LOW (ref >60)
GFR, EST AFRICAN AMERICAN: 4 mL/min — AB (ref >60)
Glucose, Bld: 173 mg/dL — ABNORMAL HIGH (ref 65–99)
Potassium: 4.6 mmol/L (ref 3.5–5.1)
SODIUM: 134 mmol/L — AB (ref 135–145)

## 2017-03-23 LAB — CBC WITH DIFFERENTIAL/PLATELET
BASOS PCT: 0 %
Basophils Absolute: 0 10*3/uL (ref 0.0–0.1)
EOS ABS: 0.5 10*3/uL (ref 0.0–0.7)
Eosinophils Relative: 6 %
HEMATOCRIT: 27.8 % — AB (ref 39.0–52.0)
Hemoglobin: 9.1 g/dL — ABNORMAL LOW (ref 13.0–17.0)
LYMPHS ABS: 1.6 10*3/uL (ref 0.7–4.0)
Lymphocytes Relative: 21 %
MCH: 30.5 pg (ref 26.0–34.0)
MCHC: 32.7 g/dL (ref 30.0–36.0)
MCV: 93.3 fL (ref 78.0–100.0)
MONO ABS: 0.8 10*3/uL (ref 0.1–1.0)
MONOS PCT: 10 %
Neutro Abs: 4.8 10*3/uL (ref 1.7–7.7)
Neutrophils Relative %: 63 %
Platelets: 33 10*3/uL — ABNORMAL LOW (ref 150–400)
RBC: 2.98 MIL/uL — ABNORMAL LOW (ref 4.22–5.81)
RDW: 16.5 % — AB (ref 11.5–15.5)
WBC: 7.7 10*3/uL (ref 4.0–10.5)

## 2017-03-23 LAB — HEPARIN INDUCED PLATELET AB (HIT ANTIBODY): Heparin Induced Plt Ab: 0.275 OD (ref 0.000–0.400)

## 2017-03-23 LAB — FOLATE RBC
Folate, Hemolysate: 271.5 ng/mL
Folate, RBC: 1021 ng/mL (ref >498)
HEMATOCRIT: 26.6 % — AB (ref 37.5–51.0)

## 2017-03-23 LAB — SAVE SMEAR

## 2017-03-23 MED ORDER — CALCITRIOL 0.25 MCG PO CAPS
ORAL_CAPSULE | ORAL | Status: AC
  Administered 2017-03-23: 1.75 ug
  Filled 2017-03-23: qty 1

## 2017-03-23 MED ORDER — CARVEDILOL 3.125 MG PO TABS
3.1250 mg | ORAL_TABLET | Freq: Two times a day (BID) | ORAL | Status: DC
  Administered 2017-03-23 (×2): 3.125 mg via ORAL
  Filled 2017-03-23 (×2): qty 1

## 2017-03-23 MED ORDER — ALTEPLASE 2 MG IJ SOLR
2.0000 mg | Freq: Once | INTRAMUSCULAR | Status: DC | PRN
Start: 2017-03-23 — End: 2017-03-23

## 2017-03-23 MED ORDER — HEPARIN SODIUM (PORCINE) 1000 UNIT/ML DIALYSIS: 1000.0000 [IU] | INTRAMUSCULAR | Status: DC | PRN

## 2017-03-23 MED ORDER — SODIUM CHLORIDE 0.9 % IV SOLN: 100.0000 mL | INTRAVENOUS | Status: DC | PRN

## 2017-03-23 MED ORDER — VANCOMYCIN HCL IN DEXTROSE 1-5 GM/200ML-% IV SOLN
1000.0000 mg | INTRAVENOUS | Status: DC
  Administered 2017-03-23 – 2017-03-25 (×2): 1000 mg via INTRAVENOUS
  Filled 2017-03-23 (×2): qty 200

## 2017-03-23 MED ORDER — LIDOCAINE HCL (PF) 1 % IJ SOLN: 5.0000 mL | INTRAMUSCULAR | Status: DC | PRN

## 2017-03-23 MED ORDER — LIDOCAINE HCL (PF) 1 % IJ SOLN
5.0000 mL | INTRAMUSCULAR | Status: DC | PRN
  Filled 2017-03-23: qty 5

## 2017-03-23 MED ORDER — LIDOCAINE-PRILOCAINE 2.5-2.5 % EX CREA
1.0000 "application " | TOPICAL_CREAM | CUTANEOUS | Status: DC | PRN
  Filled 2017-03-23: qty 5

## 2017-03-23 MED ORDER — CALCITRIOL 0.5 MCG PO CAPS
ORAL_CAPSULE | ORAL | Status: AC
  Administered 2017-03-23: 0.75 ug via ORAL
  Filled 2017-03-23: qty 3

## 2017-03-23 MED ORDER — PENTAFLUOROPROP-TETRAFLUOROETH EX AERO: 1.0000 "application " | INHALATION_SPRAY | CUTANEOUS | Status: DC | PRN

## 2017-03-23 MED ORDER — LIDOCAINE-PRILOCAINE 2.5-2.5 % EX CREA: 1.0000 "application " | TOPICAL_CREAM | CUTANEOUS | Status: DC | PRN

## 2017-03-23 MED ORDER — UREA 10 % EX LOTN
TOPICAL_LOTION | Freq: Two times a day (BID) | CUTANEOUS | Status: DC
  Administered 2017-03-24 – 2017-03-28 (×10): via TOPICAL
  Filled 2017-03-23 (×2): qty 177

## 2017-03-23 MED ORDER — VANCOMYCIN HCL IN DEXTROSE 1-5 GM/200ML-% IV SOLN
INTRAVENOUS | Status: AC
  Administered 2017-03-23: 1000 mg via INTRAVENOUS
  Filled 2017-03-23: qty 200

## 2017-03-23 MED ORDER — DARBEPOETIN ALFA 60 MCG/0.3ML IJ SOSY
PREFILLED_SYRINGE | INTRAMUSCULAR | Status: AC
  Administered 2017-03-23: 60 ug via INTRAVENOUS
  Filled 2017-03-23: qty 0.3

## 2017-03-23 MED ORDER — ALTEPLASE 2 MG IJ SOLR: 2.0000 mg | Freq: Once | INTRAMUSCULAR | Status: DC | PRN

## 2017-03-23 NOTE — Progress Notes (Signed)
Family Medicine Teaching Service Daily Progress Note Intern Pager: [phone]  Patient name: [name] Medical record number: [id number] Date of birth: [birth year] Age: 66 y.o. Gender: male  Primary Care Provider: Georges Lynch, MD Consultants: Nephro, Cardiology Code Status: Full  Pt Overview and Major Events to Date:  4/10- Admited to FPTS  Assessment and Plan: [name] is a 66 y.o. male presenting with fever and weakness, meeting SIRS criteria. PMH is significant for CHF, ESRD on Dialysis, DM, HTN, HLD, Asthma, morbid obesity, lymphedema, venous insufficiency and MGUS.  Fever, generalized weakness, SIRS with unknown source, resolved: No longer meeting SIRS criteria. AM CBC showed nl WBC. Decreased HR to the low to high 50s and stable bps. O2 remain in high 90's to 100% on room air. Physical exam showing normal work of breathing with no wheezing. CXR showed bilateral pneumonitis, no consolidation. Skin exam showing no open wounds, lesions, or catheters or signs of skin infection. Benign abdominal exam.  Labs are also remarkable for decreased Hgb 9.1, rbc 2.98, and platelets are 33 . Urinalysis showed moderate hemoglobin, 100 protein, moderate leukocytes, negative nitrites, positive bacteria. Differentials include infectious causes such as pneumonia and pyelonephritis. Non-infectious causes include smoldering multiple myeloma.   - Vital signs per floor protocol - Cardiac monitoring - Blood cultures pending - Follow up urine culture.  - Vancomycin and Zosyn per pharmacy.  - Consider d/c abx if blood and urine cx are neg - Tylenol PRN fevers, zofran PRN nausea - D/c IV fluids, pt is eating and drinking on his own   Hematuria: Hx of ESRD on HD. Patient was experiencing hematuria and passing clots early this AM. He reports that he has been experiencing blood in his urine since his in and out cath on Tuesday. Denies dysuria or odor with urination. UA from yesterday showed moderate  hemoglobin, 100 protein, moderate leukocytes, negative nitrites, positive bacteria. Culture is still pending. Hgb 9.1 today, Plts improved to 33 today. Most likely 2/2 to thrombocytopenia. - Monitor - Follow up urine cx  - Discontinue ASA 81 mg  - If hematuria persists, worsens or if platelets improve and hematuria continues consider urology c/s.  Questionable Afib with RVR, resolved: EKG in the ED showed afib with RVR. Since admission patient has been on tele and in normal sinus rhythm with a normal rate. Repeat EKG yesterday showed sinus bradycardia, non-specific T wave abnormality and resolution of afib TSH normal. Cards was consulted and recommended we hold anticoagulation because of patient's thrombocytopenia.   Elevated troponin, improving: 0.45>0.46>0.34. Patient is currently asymptomatic.  - Continue to trend  - Cardiology consulted, recs as above. F/u with cards to see if he needs to be on chronic anticoagulations on d/c.  - Discontinue ASA 81 mg - Hold heparin - SCDs   Pancytopenia, resolved: WBC 7.7, RBC 2.98, Hgb 9.1, MCV 93.3 Plts pending.   Anemia and Thrombocytopenia: Hx of ESRD on HD. Patient has a hx of MGUS and smoldering multiple myeloma. DIC panel showed PT 18.3, INR 1.51, aPTT 39, fibrinogen 726, d-dimer 3.91 and platelets 25. No schistocytes were seen on smear. Retic count was nl. B12 levels were nl. Haptoglobin and LDH was nl. Anemia and thrombocytopenia are well known complications of patient's with chronic kidney disease as well as patient on HD.  Etiologies for his pancytopenia include DIC, smoldering multiple myeloma that has progressed to multiple myeloma (less likely) or this may be dilutional. Other etiologies of  thrombocytopenia include DIC and TTP. - Peripheral  smear pending  - Folate level  - D/c ASA 81  - Hold heparin  - SCDs - Heme c/s, appreciate recs  ESRD on HD, stable: Receives HD Tuesday/Thursday/Saturday. Electrolytes stable. Creatinine is worsening  from 11.44 yesterday to 12.85 today.  - Nephrology consulted and is following.  - HD scheduled for today   Asthma, stable: Wheezes appreciated in right upper and lower lung fields this AM. CXR negative for acute infiltrate. Not on any respiratory medications at home. Respiratory rate improved since being on duobnebs.  - PRN duonebs q4h   Diabetes, stable - HgbA1c most recently 6.8 in 03/13/2017.  No medications as an outpatient. Serum glucose 173 and capillary glucose 166 this AM.  - D/c home ASA 81  - CBG qAM, qHS, no coverage, can add sliding scale insulin as needed  HTN, stable - Blood pressure stable, but 133/57 this AM. Home regimen norvasc 10 mg QD, Coreg 25 mg QD, Lasix - Cards consulted and is following  - Hold home Lasix of 80 mg BID and norvasc, can add back as needed - Decrease coreg 12.5 BID to 3.125 BID.  Hx of CHF: Echo on 06/25/2015 showing no signs of CHF with EF of 55%. No signs of fluid overload on exam. - Cards consulted and is following - ECHO today  - Hold home lasix as above - Continue coreg as above  HLD - Last lipid panel 03/13/2017 - Cholesterol 184, HDL 29, LDL 115, Triglyceride 201 - statin recommended by PCP at last outpatient visit - patient had adamantly refused statin - can continue to counsel - D/c ASA 81 QD  Morbid Obesity - weight loss counseling  Lymphedema: No signs of edema on exam but does have significant skin changes. - Start urea 10% lotion  FEN/GI: Renal diet, SLIV PPx: SCDs  Disposition: Pending antibiotic treatment and improvement of anemia and thrombocytopenia  Subjective:  [name] was doing well this AM. Denies any fevers, chills or pain today. Reports that he is not experiencing any SOB, CP, N/V/D or abnormal bruising or bleeding. No complaints today.   Objective: Temp:  [98 F (36.7 C)-98.3 F (36.8 C)] 98.3 F (36.8 C) (04/12 0605) Pulse Rate:  [53-56] 53 (04/12 0605) Resp:  [17-20] 17 (04/12 0605) BP:  (109-133)/(49-57) 133/57 (04/12 0605) SpO2:  [98 %-100 %] 100 % (04/12 0605) Weight:  [125.3 kg (276 lb 3.8 oz)] 125.3 kg (276 lb 3.8 oz) (04/12 1655) Physical Exam: General: Well appearing, NAD, lying in bed  Cardiovascular: RRR, no MRG Respiratory: CTAB. No increased work of breathing.   Extremities: BL 5th toe amputations. BLE pitting edema. HD fistula on the L arm. No erythema, edema, increased warmth or discharge around fistula present.   Laboratory:  Recent Labs Lab 03/21/17 1450 03/22/17 0025 03/22/17 0922 03/23/17 0455  WBC 4.9 3.5*  --  7.7  HGB 10.9* 9.4*  --  9.1*  HCT 33.9* 29.3*  --  27.8*  PLT 63* 26* 25* PENDING    Recent Labs Lab 03/21/17 1450 03/22/17 0025 03/23/17 0455  NA 138 136 134*  K 4.1 5.1 4.6  CL 99* 99* 96*  CO2 '26 24 25  ' BUN 37* 49* 68*  CREATININE 10.82* 11.44* 12.85*  CALCIUM 8.5* 8.1* 8.7*  PROT 7.0  --   --   BILITOT 1.1  --   --   ALKPHOS 57  --   --   ALT 15*  --   --   AST 29  --   --  GLUCOSE 123* 144* 173*   TSH: 3.436 MRSA by PCR: Negative    Imaging/Diagnostic Tests: Dg Chest 2 View  Result Date: 03/21/2017 CLINICAL DATA:  Fever, weakness, cough, shortness of Breath EXAM: CHEST  2 VIEW COMPARISON:  06/23/2015 FINDINGS: Cardiomediastinal silhouette is stable. Mild interstitial prominence bilateral lower lobes may represent mild interstitial edema scarring or pneumonitis. No segmental consolidation. IMPRESSION: Mild interstitial prominence bilateral lower lobes may represent mild interstitial edema scarring or pneumonitis. No segmental consolidation. Electronically Signed   By: Lahoma Crocker M.D.   On: 03/21/2017 15:43    Colon Flattery, Medical Student 03/23/2017, 8:53 AM  ________________________________________________________________________ I agree with the medical student's note above, with notable exceptions in the assessment and plan which I have given below.   Patrick Brahm Clarkis a 66 y.o., here with fever and  weakness, meeting SIRS criteria. Source is currently unclear. Questionable diagnosis of new Afib on ED EKG, however has been NSR on telemetry. PMH is significant for diastolic CHF, ESRD on Dialysis, MGUS, DM, HTN, HLD, Asthma, morbid obesity, lymphedema, and venous insufficiency  Fever of Uncertain Source, meeting SIRS Criteria:Vitals are stable and afebrile. UA with moderate Leukocytes but 6-30 squamous cells. Symptoms of urinary frequency therefore possibly has UTI. Should possibly consider non-infectious causes of fever. Patient does have history of MGUS and smoldering multiple myeloma. Blood Cx 4/10: GPC in pairs and chains on gram stain 1/2 bottles. Possible contaminant.  - Vancomycin and Zosyn - follow blood cultures and urine culture   Gross Hematuria: After I/O cath in the ED. Per patient symptoms are improving. Likely traumatic.  - monitor  - if worsens consider urology consult.  Lactic Acidosis:resolved. See above plan  New Afib, resolved: noted in EKG obtained in ED. Now NSR. CHADSVASC 4. Per cards, no anticoagulation in the setting of thrombocytopenia. TSH normal - cards consults, appreciate recommendations: will ask if patient needs to be on anticoag in the future or if this episode is due to sepsis   Elevated troponin: 0.45 >0.46 >0.42 > 0.34. Flat, likely in the setting of ESRD with new onset afib. Unlikely ACS per cardiology - ASA 81 mg daily (holding today due to blood in urine)  - ECHO  Pancytopenia, acute: WBC is now normal, but still has anemia and thrombocytopenia. Possibly due to this uncertain infectious source. Also has a history of MGUS with smoldering multiple myeloma. Last seen by oncology in 06/2014 and patient was lost to follow up per chart review. DIC panel not quite consistent with DIC. Normal Haptoglobin and LDH. retic normal. HIV nonreactive - peripheral smear is pending  - SCDs  - heme-onc consult - HIT panel pending  ESRD on HD:  - nephrology  consulted: HD today   Asthma: no sign of exacerbation - PRN duonebs  DM2:HgbA1c most recently 6.8 in 03/13/2017. Per chart review, diet controlled since starting dialysis. CBGs are fair withot - d/c cbgs achs - monitor glucose on BMP  HTN - home regimen norvasc 10 mg QD, Coreg 25 mg QD, Lasix - Hold home Lasix of 80 mg BID and norvasc in the setting of sepsis, can add back as needed - decrease dose of coreg to 3.153m BID due to low-normal BPs   Hx of CHF: Echo on 06/25/2015 showing no signs of CHF with EF of55%. No signs of fluid overload on exam. - Hold home lasix as above - Continue coreg as above   HLD - Last lipid panel 03/13/2017 - Cholesterol 184, HDL 29, LDL 115,  Triglyceride 201 - statin recommended by PCP at last outpatient visit - patient had adamantly refused statin - can continue to counsel  Morbid Obesity - weight loss counseling  Lymphedema: No signs of edema on exam but does have significant skin changes. - Consider adding cream to help with chronic skin changes  MGUS with smoldering multiple myeloma: Last seen by oncology in 06/2014 and patient was lost to follow up per chart review.  - SPEP, UPEP  HPI: No concerns this morning. Reports he no longer feels weak this morning. Per nursing, patient was noted to have blood clots in his urine. Patient reports of blood clots in urine since he had an I&O cath in the ED before admission. Reports his symptoms are improving.   Physical Exam GEN: NAD, obese man sitting on the side of bed. Non-toxic appearing HEENT: Atraumatic, normocephalic, neck supple, EOMI, sclera clear  CV: RRR, no murmurs, rubs, or gallops PULM: CTAB, normal effort ABD: Soft, nontender, nondistended, NABS, no organomegaly. No CVA tenderness. EXTR: chronic lymphedema with skin changes bilaterally  PSYCH: Mood and affect euthymic, normal rate and volume of speech NEURO: Awake, alert, no focal deficits grossly, normal speech  Blood Cx 4/10:  GPC in pairs and chains on gram stain 1/2 bottles Blood cx 4/11: pending Urine cx: pending   Onnie Boer, MD Family Medicine, PGY 2 03/23/2017 8:56 AM

## 2017-03-23 NOTE — Progress Notes (Signed)
Pharmacy Antibiotic Note  [name] is a 66 y.o. male admitted on 03/21/2017 with sepsis.  Pharmacy has been consulted for Vancomycin and Zosyn dosing.  Pt has ESRD and receives HS TTS.  Patient received Vancomycin 2gm IV x 1 loading dose on 4/10  Plan: Vancomycin 1gm IV after each HD (TTS) Zosyn 3.375g every 12 hours Follow-up HD schedule Consider Vanc level pre-HD 4/14 if tehrapy continues  Height: 5\' 11"  (180.3 cm) Weight: 276 lb 3.8 oz (125.3 kg) IBW/kg (Calculated) : 75.3  Temp (24hrs), Avg:98.2 F (36.8 C), Min:98 F (36.7 C), Max:98.3 F (36.8 C)   Recent Labs Lab 03/21/17 1450  03/21/17 1723 03/21/17 2156 03/22/17 0025 03/22/17 0640 03/22/17 1207 03/23/17 0455  WBC 4.9  --   --   --  3.5*  --   --  7.7  CREATININE 10.82*  --   --   --  11.44*  --   --  12.85*  LATICACIDVEN  --   < > 3.35* 2.8* 2.4* 1.3 1.5  --   < > = values in this interval not displayed.  Estimated Creatinine Clearance: 7.5 mL/min (A) (by C-G formula based on SCr of 12.85 mg/dL (H)).    Allergies  Allergen Reactions  . Iodine Swelling    06/29/2015: Patient stated IV contrast dye and iodine on skin has not caused any reaction.  . Shrimp [Shellfish Allergy] Swelling  . Contrast Media [Iodinated Diagnostic Agents] Swelling  . Heparin     Due to current thrombocytopenia    Antimicrobials this admission: 4/10 Vanc 4/10 Zosyn  Dose adjustments this admission:   Microbiology results: 4/11 MRSA PCR negative 4/10 blood x 2 >> 1/2 GPC pairs and chains 4/10 BCID call >> cultures: 1 of 2 blood cultures showing GPC in chains and pairs. BCID shows no organisms  Thank you for allowing pharmacy to be a part of this patient's care.  [employer], Pharm.D., BCPS Clinical Pharmacist Pager [phone] 03/23/2017 10:27 AM

## 2017-03-23 NOTE — Progress Notes (Signed)
PCP Social Visit Note [facility] [name], MD,MS, PGY-3  I have seen patient and discussed current hospitalization. I agree with the excellent care provided by the primary team of [facility] and want to thank them for their continued efforts in caring for my patient.   Georges Lynch, MD,MS, PGY-3 03/23/2017 11:20 AM

## 2017-03-23 NOTE — Progress Notes (Signed)
S: feels well.  No SOB   O:BP (!) 133/57 (BP Location: Right Wrist)   Pulse (!) 53   Temp 98.3 F (36.8 C) (Oral)   Resp 17   Ht 5\' 11"  (1.803 m)   Wt 125.3 kg (276 lb 3.8 oz)   SpO2 100%   BMI 38.53 kg/m   Intake/Output Summary (Last 24 hours) at 03/23/17 0920 Last data filed at 03/23/17 0656  Gross per 24 hour  Intake              463 ml  Output              325 ml  Net              138 ml   Weight change: 2.6 kg (5 lb 11.7 oz) VVO:HYWVP and alert CVS:RRR Resp:Clear Abd:+ BS NTND Ext: Chronic venous stasis changes with mild edema.  Lt AVF + bruit NEURO:CNI Ox3 no asterixis   . calcitRIOL  1.75 mcg Oral Q T,Th,Sa-HD  . calcium acetate  2,001 mg Oral TID WC  . carvedilol  3.125 mg Oral BID WC  . darbepoetin (ARANESP) injection - DIALYSIS  60 mcg Intravenous Q Thu-HD  . dextromethorphan-guaiFENesin  1 tablet Oral BID  . feeding supplement (NEPRO CARB STEADY)  237 mL Oral BID BM  . multivitamin  1 tablet Oral QHS  . piperacillin-tazobactam (ZOSYN)  IV  3.375 g Intravenous Q12H  . sodium chloride flush  3 mL Intravenous Q12H  . vancomycin  1,000 mg Intravenous Q T,Th,Sa-HD   Dg Chest 2 View  Result Date: 03/21/2017 CLINICAL DATA:  Fever, weakness, cough, shortness of Breath EXAM: CHEST  2 VIEW COMPARISON:  06/23/2015 FINDINGS: Cardiomediastinal silhouette is stable. Mild interstitial prominence bilateral lower lobes may represent mild interstitial edema scarring or pneumonitis. No segmental consolidation. IMPRESSION: Mild interstitial prominence bilateral lower lobes may represent mild interstitial edema scarring or pneumonitis. No segmental consolidation. Electronically Signed   By: Lahoma Crocker M.D.   On: 03/21/2017 15:43   BMET    Component Value Date/Time   NA 134 (L) 03/23/2017 0455   NA 138 03/03/2017 1635   NA 142 07/03/2014 0814   K 4.6 03/23/2017 0455   K 4.4 07/03/2014 0814   CL 96 (L) 03/23/2017 0455   CO2 25 03/23/2017 0455   CO2 20 (L) 07/03/2014 0814   GLUCOSE 173 (H) 03/23/2017 0455   GLUCOSE 136 07/03/2014 0814   BUN 68 (H) 03/23/2017 0455   BUN 36 (H) 03/03/2017 1635   BUN 93.6 (H) 07/03/2014 0814   CREATININE 12.85 (H) 03/23/2017 0455   CREATININE 7.3 (HH) 07/03/2014 0814   CALCIUM 8.7 (L) 03/23/2017 0455   CALCIUM 8.6 07/03/2014 0814   GFRNONAA 3 (L) 03/23/2017 0455   GFRNONAA 31 (L) 08/16/2012 1517   GFRAA 4 (L) 03/23/2017 0455   GFRAA 36 (L) 08/16/2012 1517   CBC    Component Value Date/Time   WBC 7.7 03/23/2017 0455   RBC 2.98 (L) 03/23/2017 0455   HGB 9.1 (L) 03/23/2017 0455   HGB 9.4 (L) 07/03/2014 0812   HCT 27.8 (L) 03/23/2017 0455   HCT 34.8 (L) 03/03/2017 1635   HCT 29.0 (L) 07/03/2014 0812   PLT PENDING 03/23/2017 0455   PLT 170 03/03/2017 1635   MCV 93.3 03/23/2017 0455   MCV 91 03/03/2017 1635   MCV 86.1 07/03/2014 0812   MCH 30.5 03/23/2017 0455   MCHC 32.7 03/23/2017 0455   RDW 16.5 (H) 03/23/2017  0455   RDW 16.9 (H) 03/03/2017 1635   RDW 16.3 (H) 07/03/2014 0812   LYMPHSABS 1.6 03/23/2017 0455   LYMPHSABS 1.5 07/03/2014 0812   MONOABS 0.8 03/23/2017 0455   MONOABS 0.7 07/03/2014 0812   EOSABS 0.5 03/23/2017 0455   EOSABS 0.6 (H) 07/03/2014 0812   BASOSABS 0.0 03/23/2017 0455   BASOSABS 0.1 07/03/2014 0812     Assessment: 1.  Fever 2. ESRD  TTS 3. HTN 4. Monoclonal gammopathy 5. Anemia on aranesp 6. Thrombocytopenia 7. Sec HPTH on calcitriol  Plan: 1.  HD today 2. Heme to see pt  Jason Hauge T

## 2017-03-23 NOTE — Progress Notes (Signed)
Patient voided at 0345 this morning and urine was blood red with clots. Patient states,"It has been this blood red color since they did my in and out catheter on Tuesday." Patient's urine has no odor. Patient states it does not hurt to void and is not having any bleeding out of his penis. Will continue to monitor and treat per MD orders.

## 2017-03-23 NOTE — Consult Note (Signed)
Reason for Referral: Thrombocytopenia.   HPI: 66 year old gentleman with a history of chronic renal failure hemodialysis-dependent at this time. He was also diagnosed with monoclonal gammopathy of undetermined significance in 2009. At that time he had an elevated IgG level of 1890 and an M spike of about 0.5 g/dL. He had less than 5% plasma cell infiltration in the bone marrow. He has been on observation and surveillance of October 2015 after that he appears to have been lost to follow-up. He was hospitalized on 03/21/2017 for a febrile illness and at that time was noted to have low platelet count of 63,000. His platelet count on 03/22/2017 was down to 25 and on 03/23/2017 was 33. His white cell count has been normal with a normal differential. Hemoglobin mildly low at 9.1. There are no signs or symptoms of bleeding at this time. He denied any hematochezia, melena, epistaxis or petechiae. The source of his infection has not been isolated were identified at this time. His febrile illness has resolved and he is afebrile currently.  He does not report any headaches, blurry vision, syncope or seizures. Does not report any chills, sweats or weight loss. He does not report any chest pain, orthopnea, leg edema. He does not report any cough, wheezing or hemoptysis. He is not reporting nausea, vomiting or abdominal pain. He does not report any frequency urgency or hesitancy. He does not report any skeletal complaints or pathological fractures. Remaining review of systems unremarkable.   Past Medical History:  Diagnosis Date  . Arthritis   . Asthma    as a child  . CHF (congestive heart failure) ([place])   . Chronic cystitis   . Chronic kidney disease   . Diabetes mellitus   . Dry skin   . H/O hiatal hernia    states it's been fixed  . Hyperlipidemia   . Hypertension   . Lymphedema   . Morbid obesity ([place])   . NEPHROLITHIASIS, HX OF 12/02/2009   Qualifier: Diagnosis of  By: [name], Cat    . PVD  (peripheral vascular disease) ([place])   . UMBILICAL HERNIA 2/80/0349   Qualifier: History of  By: [name] MD, Audelia Acton    . Venous insufficiency   :  Past Surgical History:  Procedure Laterality Date  . AMPUTATION     Right and left fifth toes.   . AV FISTULA PLACEMENT Left 03/21/2014   Procedure: ARTERIOVENOUS (AV) FISTULA CREATION with ultrasound;  Surgeon: [name], MD;  Location: [facility];  Service: Vascular;  Laterality: Left;  . COLONOSCOPY    . EYE SURGERY Bilateral    cataract and lens implant  . HERNIA REPAIR    . LIGATION OF COMPETING BRANCHES OF ARTERIOVENOUS FISTULA Left 06/29/2015   Procedure: LIGATION OF LEFT ARM RADIOCEPHALIC ARTERIOVENOUS FISTULA SIDE BRANCHES;  Surgeon: Conrad Konawa, MD;  Location: [facility];  Service: Vascular;  Laterality: Left;  . Popliteal to posterior tibial bypass     2006  . R knee arthoscopic repair of meniscus    . UMBILICAL HERNIA REPAIR    :   Current Facility-Administered Medications:  .  0.9 %  sodium chloride infusion, 100 mL, Intravenous, PRN, Alric Seton, PA-C .  0.9 %  sodium chloride infusion, 100 mL, Intravenous, PRN, Alric Seton, PA-C .  0.9 %  sodium chloride infusion, 250 mL, Intravenous, PRN, Everrett Coombe, MD .  acetaminophen (TYLENOL) tablet 650 mg, 650 mg, Oral, Q6H PRN **OR** acetaminophen (TYLENOL) suppository 650 mg, 650 mg, Rectal,  Q6H PRN, [name], MD .  alteplase (CATHFLO ACTIVASE) injection 2 mg, 2 mg, Intracatheter, Once PRN, Alric Seton, PA-C .  calcitRIOL (ROCALTROL) capsule 1.75 mcg, 1.75 mcg, Oral, Q T,Th,Sa-HD, Alric Seton, PA-C .  calcium acetate (PHOSLO) capsule 2,001 mg, 2,001 mg, Oral, TID WC, Alric Seton, PA-C, 2,001 mg at 03/23/17 0910 .  carvedilol (COREG) tablet 3.125 mg, 3.125 mg, Oral, BID WC, Smiley Houseman, MD, 3.125 mg at 03/23/17 0910 .  Darbepoetin Alfa (ARANESP) injection 60 mcg, 60 mcg, Intravenous, Q Thu-HD, Alric Seton, PA-C .  dextromethorphan-guaiFENesin (MUCINEX DM)  30-600 MG per 12 hr tablet 1 tablet, 1 tablet, Oral, BID, Lupita Dawn, MD, 1 tablet at 03/23/17 0910 .  feeding supplement (NEPRO CARB STEADY) liquid 237 mL, 237 mL, Oral, BID BM, Alric Seton, PA-C, 237 mL at 03/23/17 0911 .  heparin injection 1,000 Units, 1,000 Units, Dialysis, PRN, Alric Seton, PA-C .  ipratropium-albuterol (DUONEB) 0.5-2.5 (3) MG/3ML nebulizer solution 3 mL, 3 mL, Nebulization, Q4H PRN, Lupita Dawn, MD .  lidocaine (PF) (XYLOCAINE) 1 % injection 5 mL, 5 mL, Intradermal, PRN, Alric Seton, PA-C .  lidocaine-prilocaine (EMLA) cream 1 application, 1 application, Topical, PRN, Alric Seton, PA-C .  multivitamin (RENA-VIT) tablet 1 tablet, 1 tablet, Oral, QHS, Alric Seton, PA-C, 1 tablet at 03/22/17 2121 .  ondansetron (ZOFRAN) tablet 4 mg, 4 mg, Oral, Q6H PRN **OR** ondansetron (ZOFRAN) injection 4 mg, 4 mg, Intravenous, Q6H PRN, [name], MD .  pentafluoroprop-tetrafluoroeth (GEBAUERS) aerosol 1 application, 1 application, Topical, PRN, Alric Seton, PA-C .  piperacillin-tazobactam (ZOSYN) IVPB 3.375 g, 3.375 g, Intravenous, Q12H, Melburn Popper, RPH, 3.375 g at 03/23/17 0353 .  polyethylene glycol (MIRALAX / GLYCOLAX) packet 17 g, 17 g, Oral, Daily PRN, [name], MD .  sodium chloride flush (NS) 0.9 % injection 3 mL, 3 mL, Intravenous, Q12H, Everrett Coombe, MD, 3 mL at 03/22/17 2121 .  sodium chloride flush (NS) 0.9 % injection 3 mL, 3 mL, Intravenous, PRN, Everrett Coombe, MD .  urea 10 % lotion, , Topical, BID, [name], MD .  vancomycin (VANCOCIN) IVPB 1000 mg/200 mL premix, 1,000 mg, Intravenous, Q T,Th,Sa-HD, Kimberly B Hammons, RPH:  Allergies  Allergen Reactions  . Iodine Swelling    06/29/2015: Patient stated IV contrast dye and iodine on skin has not caused any reaction.  . Shrimp [Shellfish Allergy] Swelling  . Contrast Media [Iodinated Diagnostic Agents] Swelling  . Heparin     Due to current thrombocytopenia   :  Family History  Problem Relation Age of Onset  . Diabetes Mother   . Heart disease Mother   . Diabetes Brother   :  Social History   Social History  . Marital status: Legally Separated    Spouse name: N/A  . Number of children: 1  . Years of education: N/A   Occupational History  .  Disabled    Social History Main Topics  . Smoking status: Former Smoker    Quit date: 06/06/1979  . Smokeless tobacco: Former Systems developer  . Alcohol use No     Comment:   "years ago" , none now  . Drug use: No  . Sexual activity: No   Other Topics Concern  . Not on file   Social History Narrative   Lives alone.  Only son is deceased.   :  Pertinent items are noted in HPI.  Exam: Blood pressure (!) 120/59, pulse (!) 50, temperature 98.2 F (36.8  C), temperature source Oral, resp. rate 18, height 5\' 11"  (1.803 m), weight 276 lb 7.3 oz (125.4 kg), SpO2 100 %.  ECOG 1 General appearance: alert and cooperative gentleman appeared without distress. Throat: No oral thrush or ulcers. Neck: no adenopathy Back: negative Resp: clear to auscultation bilaterally Chest wall: no tenderness Cardio: regular rate and rhythm, S1, S2 normal, no murmur, click, rub or gallop GI: soft, non-tender; bowel sounds normal; no masses,  no organomegaly Extremities: extremities normal, atraumatic, no cyanosis or edema  skin: Showed no rashes or petechiae.   Recent Labs  03/22/17 0025 03/22/17 0922 03/23/17 0455  WBC 3.5*  --  7.7  HGB 9.4*  --  9.1*  HCT 29.3*  --  27.8*  PLT 26* 25* 33*    Recent Labs  03/22/17 0025 03/23/17 0455  NA 136 134*  K 5.1 4.6  CL 99* 96*  CO2 24 25  GLUCOSE 144* 173*  BUN 49* 68*  CREATININE 11.44* 12.85*  CALCIUM 8.1* 8.7*     Blood smear review: Personally reviewed today and showed no evidence of schistocytes, red cell fragments or immature red cells. His platelet count has been noted to be low but his progress or enlarged. There is no evidence of polychromasia or  nucleated red cells.   Dg Chest 2 View  Result Date: 03/21/2017 CLINICAL DATA:  Fever, weakness, cough, shortness of Breath EXAM: CHEST  2 VIEW COMPARISON:  06/23/2015 FINDINGS: Cardiomediastinal silhouette is stable. Mild interstitial prominence bilateral lower lobes may represent mild interstitial edema scarring or pneumonitis. No segmental consolidation. IMPRESSION: Mild interstitial prominence bilateral lower lobes may represent mild interstitial edema scarring or pneumonitis. No segmental consolidation. Electronically Signed   By: Lahoma Crocker M.D.   On: 03/21/2017 15:43    Assessment and Plan:  66 year old gentleman with the following issues:  1. Thrombocytopenia noted acutely on 03/21/2017. He had normal platelet counts on 03/03/2017. The acute drop in his platelet count is more likely consistent with reactive febrile illness, sepsis or drug exposure. HIT is also a possibility but considered less likely. DIC, TTP are also extremely unlikely.  A management standpoint, I recommended observation and surveillance at this time. I see no reason for transfusion and his platelet count appears to be stabilizing or may be arising in last 24 hours. If his platelet counts continues to rise in the next 24-48 hours, no further workup is needed at this time.  2. IgG kappa MGUS diagnosed in 2009. His most recent protein studies in 2015 were reviewed and not different than his initial protein studies in 2009. This appears to be an indolent process and I do not think it is contributing to his acute illness or abnormal blood counts. Serum protein electrophoresis has been repeated and results are pending.  We'll arrange follow-up as an outpatient basis in the future to continue monitoring for this condition.  Please call with any questions regarding his care.

## 2017-03-24 ENCOUNTER — Inpatient Hospital Stay (HOSPITAL_COMMUNITY): Payer: Medicare Other

## 2017-03-24 ENCOUNTER — Encounter (HOSPITAL_COMMUNITY): Payer: Self-pay | Admitting: Radiology

## 2017-03-24 DIAGNOSIS — K029 Dental caries, unspecified: Secondary | ICD-10-CM

## 2017-03-24 DIAGNOSIS — M542 Cervicalgia: Secondary | ICD-10-CM

## 2017-03-24 DIAGNOSIS — R9431 Abnormal electrocardiogram [ECG] [EKG]: Secondary | ICD-10-CM

## 2017-03-24 DIAGNOSIS — R7989 Other specified abnormal findings of blood chemistry: Secondary | ICD-10-CM

## 2017-03-24 DIAGNOSIS — C9000 Multiple myeloma not having achieved remission: Secondary | ICD-10-CM

## 2017-03-24 DIAGNOSIS — R1011 Right upper quadrant pain: Secondary | ICD-10-CM

## 2017-03-24 DIAGNOSIS — Z87891 Personal history of nicotine dependence: Secondary | ICD-10-CM

## 2017-03-24 DIAGNOSIS — Z888 Allergy status to other drugs, medicaments and biological substances status: Secondary | ICD-10-CM

## 2017-03-24 DIAGNOSIS — R7881 Bacteremia: Secondary | ICD-10-CM

## 2017-03-24 DIAGNOSIS — Z91013 Allergy to seafood: Secondary | ICD-10-CM

## 2017-03-24 DIAGNOSIS — Z8249 Family history of ischemic heart disease and other diseases of the circulatory system: Secondary | ICD-10-CM

## 2017-03-24 DIAGNOSIS — Z833 Family history of diabetes mellitus: Secondary | ICD-10-CM

## 2017-03-24 DIAGNOSIS — K08409 Partial loss of teeth, unspecified cause, unspecified class: Secondary | ICD-10-CM

## 2017-03-24 LAB — CBC WITH DIFFERENTIAL/PLATELET
BASOS ABS: 0 10*3/uL (ref 0.0–0.1)
Basophils Relative: 0 %
Eosinophils Absolute: 0.5 10*3/uL (ref 0.0–0.7)
Eosinophils Relative: 6 %
HEMATOCRIT: 31.6 % — AB (ref 39.0–52.0)
HEMOGLOBIN: 10.5 g/dL — AB (ref 13.0–17.0)
LYMPHS ABS: 1.6 10*3/uL (ref 0.7–4.0)
LYMPHS PCT: 18 %
MCH: 30.8 pg (ref 26.0–34.0)
MCHC: 33.2 g/dL (ref 30.0–36.0)
MCV: 92.7 fL (ref 78.0–100.0)
Monocytes Absolute: 0.9 10*3/uL (ref 0.1–1.0)
Monocytes Relative: 10 %
NEUTROS ABS: 5.7 10*3/uL (ref 1.7–7.7)
NEUTROS PCT: 66 %
Platelets: 31 10*3/uL — ABNORMAL LOW (ref 150–400)
RBC: 3.41 MIL/uL — AB (ref 4.22–5.81)
RDW: 16.3 % — ABNORMAL HIGH (ref 11.5–15.5)
WBC: 8.7 10*3/uL (ref 4.0–10.5)

## 2017-03-24 LAB — GLUCOSE, CAPILLARY
GLUCOSE-CAPILLARY: 102 mg/dL — AB (ref 65–99)
Glucose-Capillary: 114 mg/dL — ABNORMAL HIGH (ref 65–99)
Glucose-Capillary: 173 mg/dL — ABNORMAL HIGH (ref 65–99)
Glucose-Capillary: 132 mg/dL — ABNORMAL HIGH (ref 65–99)

## 2017-03-24 LAB — ECHOCARDIOGRAM COMPLETE
Height: 71 in
WEIGHTICAEL: 4373.93 [oz_av]

## 2017-03-24 LAB — PATHOLOGIST SMEAR REVIEW

## 2017-03-24 LAB — CULTURE, BLOOD (ROUTINE X 2)

## 2017-03-24 LAB — BASIC METABOLIC PANEL
ANION GAP: 9 (ref 5–15)
BUN: 33 mg/dL — AB (ref 6–20)
CHLORIDE: 95 mmol/L — AB (ref 101–111)
CO2: 29 mmol/L (ref 22–32)
Calcium: 9.1 mg/dL (ref 8.9–10.3)
Creatinine, Ser: 8.1 mg/dL — ABNORMAL HIGH (ref 0.61–1.24)
GFR calc Af Amer: 7 mL/min — ABNORMAL LOW (ref >60)
GFR calc non Af Amer: 6 mL/min — ABNORMAL LOW (ref >60)
GLUCOSE: 106 mg/dL — AB (ref 65–99)
POTASSIUM: 4.3 mmol/L (ref 3.5–5.1)
Sodium: 133 mmol/L — ABNORMAL LOW (ref 135–145)

## 2017-03-24 LAB — HEPATIC FUNCTION PANEL
ALBUMIN: 2.6 g/dL — AB (ref 3.5–5.0)
ALK PHOS: 56 U/L (ref 38–126)
ALT: 18 U/L (ref 17–63)
AST: 22 U/L (ref 15–41)
BILIRUBIN DIRECT: 0.1 mg/dL (ref 0.1–0.5)
BILIRUBIN TOTAL: 0.7 mg/dL (ref 0.3–1.2)
Indirect Bilirubin: 0.6 mg/dL (ref 0.3–0.9)
Total Protein: 7.2 g/dL (ref 6.5–8.1)

## 2017-03-24 MED ORDER — IOPAMIDOL (ISOVUE-300) INJECTION 61%
INTRAVENOUS | Status: AC
  Administered 2017-03-24: 75 mL
  Filled 2017-03-24: qty 75

## 2017-03-24 MED ORDER — FUROSEMIDE 80 MG PO TABS
80.0000 mg | ORAL_TABLET | Freq: Two times a day (BID) | ORAL | Status: DC
  Administered 2017-03-24 – 2017-03-28 (×9): 80 mg via ORAL
  Filled 2017-03-24 (×9): qty 1

## 2017-03-24 MED ORDER — PERFLUTREN LIPID MICROSPHERE
1.0000 mL | INTRAVENOUS | Status: AC | PRN
  Administered 2017-03-24: 2 mL via INTRAVENOUS
  Filled 2017-03-24: qty 10

## 2017-03-24 NOTE — Progress Notes (Signed)
Pt reported that while in dialysis he eperienced pain in right neck and became a knot.  On arrival on the unit he reported to day shift RN who massaged it and the knot went away and the now the pain is located in the abdomen.  Offered to give him PRN tylenol but he refused.  Hot pack applied.

## 2017-03-24 NOTE — Progress Notes (Signed)
S: Had cramping with HD yest   CO mild RUQ pain this AM O:BP 135/60 (BP Location: Right Wrist)   Pulse (!) 50   Temp 98.3 F (36.8 C) (Oral)   Resp 17   Ht 5\' 11"  (1.803 m)   Wt 124 kg (273 lb 5.9 oz)   SpO2 100%   BMI 38.13 kg/m   Intake/Output Summary (Last 24 hours) at 03/24/17 0943 Last data filed at 03/24/17 0839  Gross per 24 hour  Intake                0 ml  Output             2308 ml  Net            -2308 ml   Weight change: 0.1 kg (3.5 oz) EVO:JJKKX and alert CVS:RRR Resp:Clear Abd:+ BS ND  Minimal tenderness RUQ no rebound or gaurding Ext: Chronic venous stasis changes with mild edema.  Lt AVF + bruit NEURO:CNI Ox3 no asterixis   . calcitRIOL  1.75 mcg Oral Q T,Th,Sa-HD  . calcium acetate  2,001 mg Oral TID WC  . darbepoetin (ARANESP) injection - DIALYSIS  60 mcg Intravenous Q Thu-HD  . dextromethorphan-guaiFENesin  1 tablet Oral BID  . feeding supplement (NEPRO CARB STEADY)  237 mL Oral BID BM  . multivitamin  1 tablet Oral QHS  . piperacillin-tazobactam (ZOSYN)  IV  3.375 g Intravenous Q12H  . sodium chloride flush  3 mL Intravenous Q12H  . urea   Topical BID  . vancomycin  1,000 mg Intravenous Q T,Th,Sa-HD   No results found. BMET    Component Value Date/Time   NA 133 (L) 03/24/2017 0446   NA 138 03/03/2017 1635   NA 142 07/03/2014 0814   K 4.3 03/24/2017 0446   K 4.4 07/03/2014 0814   CL 95 (L) 03/24/2017 0446   CO2 29 03/24/2017 0446   CO2 20 (L) 07/03/2014 0814   GLUCOSE 106 (H) 03/24/2017 0446   GLUCOSE 136 07/03/2014 0814   BUN 33 (H) 03/24/2017 0446   BUN 36 (H) 03/03/2017 1635   BUN 93.6 (H) 07/03/2014 0814   CREATININE 8.10 (H) 03/24/2017 0446   CREATININE 7.3 (HH) 07/03/2014 0814   CALCIUM 9.1 03/24/2017 0446   CALCIUM 8.6 07/03/2014 0814   GFRNONAA 6 (L) 03/24/2017 0446   GFRNONAA 31 (L) 08/16/2012 1517   GFRAA 7 (L) 03/24/2017 0446   GFRAA 36 (L) 08/16/2012 1517   CBC    Component Value Date/Time   WBC 8.7 03/24/2017 0446   RBC 3.41 (L) 03/24/2017 0446   HGB 10.5 (L) 03/24/2017 0446   HGB 9.4 (L) 07/03/2014 0812   HCT 31.6 (L) 03/24/2017 0446   HCT 26.6 (L) 03/22/2017 1207   HCT 29.0 (L) 07/03/2014 0812   PLT 31 (L) 03/24/2017 0446   PLT 170 03/03/2017 1635   MCV 92.7 03/24/2017 0446   MCV 91 03/03/2017 1635   MCV 86.1 07/03/2014 0812   MCH 30.8 03/24/2017 0446   MCHC 33.2 03/24/2017 0446   RDW 16.3 (H) 03/24/2017 0446   RDW 16.9 (H) 03/03/2017 1635   RDW 16.3 (H) 07/03/2014 0812   LYMPHSABS 1.6 03/24/2017 0446   LYMPHSABS 1.5 07/03/2014 0812   MONOABS 0.9 03/24/2017 0446   MONOABS 0.7 07/03/2014 0812   EOSABS 0.5 03/24/2017 0446   EOSABS 0.6 (H) 07/03/2014 0812   BASOSABS 0.0 03/24/2017 0446   BASOSABS 0.1 07/03/2014 0812     Assessment: 1.  Fever, resolved  BC neg to date 2. ESRD  TTS 3. HTN 4. Monoclonal gammopathy.  Seen by Heme with plans for continued observation 5. Anemia on aranesp 6. Thrombocytopenia 7. Sec HPTH on calcitriol  Plan: 1.  HD tomorrow.  Will plan first shift in case he goes home 2. Note [place] Abd order   Duanna Runk T

## 2017-03-24 NOTE — Progress Notes (Signed)
Cardiac surveillance called and reported to RN that pt had just brady down to the 30's.  Rn went to pt and assessed him.  Pt is asymptomatic.  Vital sign was taken: T. 98.3 oral R. 18, BP . 163/60, and O2 at 100% on room air. He did complaint of soreness in his abdomen which he attribute to the even in dialysis yesterday.  He refused tylenol.  Heat pack applied and he reported some relieve. He is in chair resting.  Will continue to monitor.

## 2017-03-24 NOTE — Progress Notes (Signed)
Family Medicine Teaching Service Daily Progress Note Intern Pager: [phone]  Patient name: [name] Medical record number: [id number] Date of birth: 19-May-1951 Age: 66 y.o. Gender: male  Primary Care Provider: Georges Lynch, MD Consultants: Nephro, Cardiology Code Status: Full  Pt Overview and Major Events to Date:  4/10- Admited to FPTS  Assessment and Plan: [name] is a 66 y.o. male presenting with fever and weakness, meeting SIRS criteria. PMH is significant for CHF, ESRD on Dialysis, DM, HTN, HLD, Asthma, morbid obesity, lymphedema, venous insufficiency and MGUS.  Fever of Uncertain Source, meeting SIRS Criteria:Vitals are stable and afebrile.  Blood Cx 4/10: GPC in pairs and chains on gram stain, with rothia species in 1/2 bottles. Possible contaminant. Blood cx 4/11: NG x 2 days - Vancomycin and Zosyn, will discontinue since repeat blood cultures show no growth x 2 days.  - follow blood cultures and urine culture   Gross Hematuria, resolved: After I/O cath in the ED. Per patient symptoms are improving. Likely traumatic.   Sinus Bradycardia: Asymptomatic  - Holding Coreg  RUQ Abdominal Pain: started while in HD. Not associated with food. No history of similar symptoms. Unclear in etiology.  - RUQ US - Hepatic function panel  New Afib, resolved: noted in EKG obtained in ED. Now NSR. CHADSVASC 4. Per cards, no anticoagulation in the setting of thrombocytopenia. TSH normal - cards consults, appreciate recommendations: will ask if patient needs to be on anticoag in the future or if this episode is due to sepsis   Elevated troponin: 0.45 >0.46 >0.42 > 0.34. Flat, likely in the setting of ESRD with new onset afib. Unlikely ACS per cardiology - ASA 81 mg daily (holding today due to blood in urine)  - ECHO  Thrombocytopenia/Anemia:  Likely reactive in the setting of acute infection/stress. Hgb is stable/improving. Plt stable - peripheral smear unremarkalbe -  heme-onc consult: likely reactive due to acute illness. SPEP is unchanged from prior. If his platelet counts continues to rise in the next 24-48 hours, no further workup is needed at this time. Will arrange follow up. - HIT panel negative   ESRD on HD:  - nephrology consulted  Asthma: no sign of exacerbation - PRN duonebs  DM2:HgbA1c most recently 6.8 in 03/13/2017. Diet controlled . Glucose stable on BMP - monitor glucose on BMP  HTN - home regimen norvasc 10 mg QD, Coreg 25 mg QD, Lasix - Hold  norvasc as BP okay - off coreg 2/2 bradycardia - restart home Lasix  Hx of CHF: Echo on 06/25/2015 showing no signs of CHF with EF of55%. No signs of fluid overload on exam. - restart Lasix - holding home cored  HLD - Last lipid panel 03/13/2017 - Cholesterol 184, HDL 29, LDL 115, Triglyceride 201 - statin recommended by PCP at last outpatient visit - patient had adamantly refused statin - can continue to counsel  Morbid Obesity - weight loss counseling  Lymphedema: No signs of edema on exam but does have significant skin changes. - urea lotion   MGUS with smoldering multiple myeloma: Last seen by oncology in 06/2014 and patient was lost to follow up per chart review. SPEP unchanged from prior. Seen by oncology during hospitalization   Asthma, stable: Wheezes appreciated in right upper and lower lung fields this AM. CXR negative for acute infiltrate. Not on any respiratory medications at home. Respiratory rate improved since being on duobnebs.  - PRN duonebs q4h    FEN/GI: Renal diet, SLIV PPx: SCDs  Disposition: pending evaluation of RUQ abdominal pain and ECHO.   Subjective:  Per chart review, had bradycardia to the 30s early in the morning and patient was asymptomatic. Reports of having right neck pain and a knot in his right neck during the last hour of dialysis. Reports that he also started having pain in the RUQ during dialysis. Neck pain has now resolved, but RUQ pain  persistent. It does not radiate and is not worse with meals. Still has his gall bladder.   Objective: Temp:  [97.8 F (36.6 C)-98.4 F (36.9 C)] 98.3 F (36.8 C) (04/13 0543) Pulse Rate:  [47-57] 50 (04/13 0543) Resp:  [17-18] 17 (04/13 0543) BP: (120-176)/(53-114) 135/60 (04/13 0543) SpO2:  [98 %-100 %] 100 % (04/13 0543) Weight:  [123.3 kg (271 lb 13.2 oz)-125.4 kg (276 lb 7.3 oz)] 124 kg (273 lb 5.9 oz) (04/13 0543) Physical Exam: General: Well appearing, NAD, lying in bed  Cardiovascular: RRR, no MRG Respiratory: CTAB. No increased work of breathing.   Extremities: BL 5th toe amputations. BLE pitting edema. HD fistula on the L arm. No erythema, edema, increased warmth or discharge around fistula present.   Laboratory:  Recent Labs Lab 03/22/17 0025 03/22/17 5009 03/22/17 1207 03/23/17 0455 03/24/17 0446  WBC 3.5*  --   --  7.7 8.7  HGB 9.4*  --   --  9.1* 10.5*  HCT 29.3*  --  26.6* 27.8* 31.6*  PLT 26* 25*  --  33* 31*    Recent Labs Lab 03/21/17 1450 03/22/17 0025 03/23/17 0455 03/24/17 0446  NA 138 136 134* 133*  K 4.1 5.1 4.6 4.3  CL 99* 99* 96* 95*  CO2 '26 24 25 29  ' BUN 37* 49* 68* 33*  CREATININE 10.82* 11.44* 12.85* 8.10*  CALCIUM 8.5* 8.1* 8.7* 9.1  PROT 7.0  --   --   --   BILITOT 1.1  --   --   --   ALKPHOS 57  --   --   --   ALT 15*  --   --   --   AST 29  --   --   --   GLUCOSE 123* 144* 173* 106*   TSH: 3.436 MRSA by PCR: Negative    Imaging/Diagnostic Tests: No results found.  [name], Medical Student 03/24/2017, 7:30 AM  ________________________________________________________________________ I agree with the medical student's note above, with notable exceptions in the assessment and plan which I have given below.   Patrick Yurchak Clarkis a 66 y.o., here with fever and weakness, meeting SIRS criteria. Source is currently unclear. Questionable diagnosis of new Afib on ED EKG, however has been NSR on telemetry. PMH is  significant for diastolic CHF, ESRD on Dialysis, MGUS, DM, HTN, HLD, Asthma, morbid obesity, lymphedema, and venous insufficiency  Fever of Uncertain Source, meeting SIRS Criteria:Vitals are stable and afebrile.  Blood Cx 4/10: GPC in pairs and chains on gram stain, with rothia species in 1/2 bottles. Possible contaminant. Blood cx 4/11: NG x 2 days - Vancomycin and Zosyn, will discontinue since repeat blood cultures show no growth x 2 days.  - follow blood cultures and urine culture   Gross Hematuria, resolved: After I/O cath in the ED. Per patient symptoms are improving. Likely traumatic.   Sinus Bradycardia: Asymptomatic  - Holding Coreg  RUQ Abdominal Pain: started while in HD. Not associated with food. No history of similar symptoms. Unclear in etiology.  - RUQ US - Hepatic function panel  New  Afib, resolved: noted in EKG obtained in ED. Now NSR. CHADSVASC 4. Per cards, no anticoagulation in the setting of thrombocytopenia. TSH normal - cards consults, appreciate recommendations: will ask if patient needs to be on anticoag in the future or if this episode is due to sepsis   Elevated troponin: 0.45 >0.46 >0.42 > 0.34. Flat, likely in the setting of ESRD with new onset afib. Unlikely ACS per cardiology - ASA 81 mg daily (holding today due to blood in urine)  - ECHO  Thrombocytopenia/Anemia:  Likely reactive in the setting of acute infection/stress. Hgb is stable/improving. Plt stable - peripheral smear unremarkalbe - heme-onc consult: likely reactive due to acute illness. SPEP is unchanged from prior. If his platelet counts continues to rise in the next 24-48 hours, no further workup is needed at this time. Will arrange follow up. - HIT panel negative   ESRD on HD:  - nephrology consulted  Asthma: no sign of exacerbation - PRN duonebs  DM2:HgbA1c most recently 6.8 in 03/13/2017. Diet controlled . Glucose stable on BMP - monitor glucose on BMP  HTN - home regimen  norvasc 10 mg QD, Coreg 25 mg QD, Lasix - Hold  norvasc as BP okay - off coreg 2/2 bradycardia - restart home Lasix  Hx of CHF: Echo on 06/25/2015 showing no signs of CHF with EF of55%. No signs of fluid overload on exam. - restart Lasix - holding home cored  HLD - Last lipid panel 03/13/2017 - Cholesterol 184, HDL 29, LDL 115, Triglyceride 201 - statin recommended by PCP at last outpatient visit - patient had adamantly refused statin - can continue to counsel  Morbid Obesity - weight loss counseling  Lymphedema: No signs of edema on exam but does have significant skin changes. - urea lotion   MGUS with smoldering multiple myeloma: Last seen by oncology in 06/2014 and patient was lost to follow up per chart review. SPEP unchanged from prior. Seen by oncology during hospitalization   HPI:  Per chart review, had bradycardia to the 30s early in the morning and patient was asymptomatic. Reports of having right neck pain and a knot in his right neck during the last hour of dialysis. Reports that he also started having pain in the RUQ during dialysis. Neck pain has now resolved, but RUQ pain persistent. It does not radiate and is not worse with meals. Still has his gall bladder.   Physical Exam GEN: NAD, obese man sitting on the side of bed. Non-toxic appearing HEENT: Atraumatic, normocephalic, neck supple, EOMI, sclera clear  CV: RRR, no murmurs, rubs, or gallops PULM: CTAB, normal effort ABD: Soft, TTP in the RUQ without rebound or guarding, negative murphy sign, nondistended, NABS, no organomegaly. No CVA tenderness. EXTR: chronic lymphedema with skin changes bilaterally  PSYCH: Mood and affect euthymic, normal rate and volume of speech NEURO: Awake, alert, no focal deficits grossly, normal speech  Blood Cx 4/10: GPC in pairs and chains on gram stain 1/2 bottles Blood cx 4/11: NG x 2 days  Urine cx: insignificant growth   Onnie Boer, MD Family Medicine, PGY  2 03/24/2017 9:06 AM

## 2017-03-24 NOTE — Care Management Important Message (Signed)
Important Message  Patient Details  Name: [name] MRN: [id number] Date of Birth: [birth year]   Medicare Important Message Given:  Yes    [name] May 03/24/2017, 2:05 PM

## 2017-03-24 NOTE — Consult Note (Signed)
Date of Admission:  03/21/2017  Date of Consult:  03/24/2017  Reason for Consult: Rothia bacteremia Referring Physician: Dr. Ree Kida   HPI: [name] is an 66 y.o. male with history of multiple myeloma, end-stage renal disease on hemodialysis who after he underwent dialysis on Friday developed fevers and chills and malaise. He came to the emerge department where he was hypotensive and febrile to 102. He probably had some expiratory wheezes at the time. His lactate was elevated at 3.6. He was admitted to the family medicine service blood cultures were obtained and he was started on vancomycin and Zosyn in the interim one of 2 blood cultures grew gram-positive cocci in pairs and chains which are thought to represent a contaminant having been found in only one of 2 blood cultures. However this organism ultimately was identified as a Rothia species. Today was alerted by clinical pharmacy to the fact that this patient was growing a Jabier Mutton he is species and blood cultures and in particular with concerned that he might have a potential invasive infection with this organism. Certainly it is not a common skin contaminant and is known to be associated with periodontal disease and can cause invasive infections including endocarditis and discitis.  I have therefore in the interim arrange for CT of the soft tissues of the neck and the maxillofacial area which have not shown a clear-cut focus of infection. I would like the patient to have a Panorex as well. Finally a thing we should also consider obtaining a transesophageal echocardiogram to investigate for endocarditis.  Currently the patient's symptoms largely are of some neck pain that began during dialysis and radiated down his neck into his chest. Otherwise he feels improved compared to when he was admitted.   Past Medical History:  Diagnosis Date  . Arthritis   . Asthma    as a child  . CHF (congestive heart failure) ([place])   . Chronic  cystitis   . Chronic kidney disease   . Diabetes mellitus   . Dry skin   . H/O hiatal hernia    states it's been fixed  . Hyperlipidemia   . Hypertension   . Lymphedema   . Morbid obesity ([place])   . NEPHROLITHIASIS, HX OF 12/02/2009   Qualifier: Diagnosis of  By: [name], Cat    . PVD (peripheral vascular disease) ([place])   . Renal insufficiency   . UMBILICAL HERNIA 5/59/7416   Qualifier: History of  By: [name] MD, Audelia Acton    . Venous insufficiency     Past Surgical History:  Procedure Laterality Date  . AMPUTATION     Right and left fifth toes.   . AV FISTULA PLACEMENT Left 03/21/2014   Procedure: ARTERIOVENOUS (AV) FISTULA CREATION with ultrasound;  Surgeon: [name], MD;  Location: [facility];  Service: Vascular;  Laterality: Left;  . COLONOSCOPY    . EYE SURGERY Bilateral    cataract and lens implant  . HERNIA REPAIR    . LIGATION OF COMPETING BRANCHES OF ARTERIOVENOUS FISTULA Left 06/29/2015   Procedure: LIGATION OF LEFT ARM RADIOCEPHALIC ARTERIOVENOUS FISTULA SIDE BRANCHES;  Surgeon: Conrad Baraboo, MD;  Location: [facility];  Service: Vascular;  Laterality: Left;  . Popliteal to posterior tibial bypass     2006  . R knee arthoscopic repair of meniscus    . UMBILICAL HERNIA REPAIR      Social History:  reports that he quit smoking about 37 years ago. He  has quit using smokeless tobacco. He reports that he does not drink alcohol or use drugs.   Family History  Problem Relation Age of Onset  . Diabetes Mother   . Heart disease Mother   . Diabetes Brother     Allergies  Allergen Reactions  . Shrimp [Shellfish Allergy] Swelling  . Heparin     Due to current thrombocytopenia     Medications: I have reviewed patients current medications as documented in Epic Anti-infectives    Start     Dose/Rate Route Frequency Ordered Stop   03/23/17 1200  vancomycin (VANCOCIN) IVPB 1000 mg/200 mL premix     1,000 mg 200 mL/hr over 60 Minutes Intravenous Every T-Th-Sa (Hemodialysis)  03/23/17 0859     03/22/17 0300  piperacillin-tazobactam (ZOSYN) IVPB 3.375 g  Status:  Discontinued     3.375 g 12.5 mL/hr over 240 Minutes Intravenous Every 12 hours 03/21/17 1533 03/24/17 1506   03/21/17 1530  piperacillin-tazobactam (ZOSYN) IVPB 3.375 g     3.375 g 100 mL/hr over 30 Minutes Intravenous  Once 03/21/17 1518 03/21/17 1658   03/21/17 1530  vancomycin (VANCOCIN) IVPB 1000 mg/200 mL premix  Status:  Discontinued     1,000 mg 200 mL/hr over 60 Minutes Intravenous  Once 03/21/17 1518 03/21/17 1528   03/21/17 1530  vancomycin (VANCOCIN) 2,000 mg in sodium chloride 0.9 % 500 mL IVPB     2,000 mg 250 mL/hr over 120 Minutes Intravenous  Once 03/21/17 1528 03/21/17 1821         ROS: as in HPI otherwise remainder of 12 point Review of Systems is negative    Blood pressure (!) 170/56, pulse (!) 54, temperature 98.7 F (37.1 C), temperature source Oral, resp. rate 17, height 5' 11" (1.803 m), weight 273 lb 5.9 oz (124 kg), SpO2 99 %. General: Alert and awake, oriented x3, not in any acute distress. HEENT: anicteric sclera,  EOMI, oropharynx clear and without exudate, several missing teeth and caries Cardiovascular:rregular rate, normal r,  no murmur rubs or gallops Pulmonary: clear to auscultation bilaterally, no wheezing, rales or rhonchi Gastrointestinal: soft nontender, nondistended, normal bowel sounds, Musculoskeletal: no  clubbing or edema noted bilaterally Skin, soft tissue: no rashes, graft site is clean Neuro: nonfocal, strength and sensation intact   Results for orders placed or performed during the hospital encounter of 03/21/17 (from the past 48 hour(s))  Culture, blood (routine x 2)     Status: None (Preliminary result)   Collection Time: 03/22/17  9:14 PM  Result Value Ref Range   Specimen Description BLOOD RIGHT ANTECUBITAL    Special Requests      BOTTLES DRAWN AEROBIC AND ANAEROBIC Blood Culture adequate volume   Culture NO GROWTH 2 DAYS    Report  Status PENDING   Culture, blood (routine x 2)     Status: None (Preliminary result)   Collection Time: 03/22/17  9:18 PM  Result Value Ref Range   Specimen Description BLOOD RIGHT HAND    Special Requests IN PEDIATRIC BOTTLE Blood Culture adequate volume    Culture NO GROWTH 2 DAYS    Report Status PENDING   Glucose, capillary     Status: Abnormal   Collection Time: 03/22/17 10:10 PM  Result Value Ref Range   Glucose-Capillary 209 (H) 65 - 99 mg/dL  CBC with Differential/Platelet     Status: Abnormal   Collection Time: 03/23/17  4:55 AM  Result Value Ref Range   WBC 7.7 4.0 - 10.5 K/uL  RBC 2.98 (L) 4.22 - 5.81 MIL/uL   Hemoglobin 9.1 (L) 13.0 - 17.0 g/dL   HCT 27.8 (L) 39.0 - 52.0 %   MCV 93.3 78.0 - 100.0 fL   MCH 30.5 26.0 - 34.0 pg   MCHC 32.7 30.0 - 36.0 g/dL   RDW 16.5 (H) 11.5 - 15.5 %   Platelets 33 (L) 150 - 400 K/uL    Comment: PLATELET COUNT CONFIRMED BY SMEAR   Neutrophils Relative % 63 %   Neutro Abs 4.8 1.7 - 7.7 K/uL   Lymphocytes Relative 21 %   Lymphs Abs 1.6 0.7 - 4.0 K/uL   Monocytes Relative 10 %   Monocytes Absolute 0.8 0.1 - 1.0 K/uL   Eosinophils Relative 6 %   Eosinophils Absolute 0.5 0.0 - 0.7 K/uL   Basophils Relative 0 %   Basophils Absolute 0.0 0.0 - 0.1 K/uL  Basic metabolic panel     Status: Abnormal   Collection Time: 03/23/17  4:55 AM  Result Value Ref Range   Sodium 134 (L) 135 - 145 mmol/L   Potassium 4.6 3.5 - 5.1 mmol/L   Chloride 96 (L) 101 - 111 mmol/L   CO2 25 22 - 32 mmol/L   Glucose, Bld 173 (H) 65 - 99 mg/dL   BUN 68 (H) 6 - 20 mg/dL   Creatinine, Ser 12.85 (H) 0.61 - 1.24 mg/dL   Calcium 8.7 (L) 8.9 - 10.3 mg/dL   GFR calc non Af Amer 3 (L) >60 mL/min   GFR calc Af Amer 4 (L) >60 mL/min    Comment: (NOTE) The eGFR has been calculated using the CKD EPI equation. This calculation has not been validated in all clinical situations. eGFR's persistently <60 mL/min signify possible Chronic Kidney Disease.    Anion gap 13 5 -  15  Save smear     Status: None   Collection Time: 03/23/17  4:55 AM  Result Value Ref Range   Smear Review SMEAR STAINED AND AVAILABLE FOR REVIEW   Pathologist smear review     Status: None   Collection Time: 03/23/17  4:55 AM  Result Value Ref Range   Path Review Thrombocytopenia and mild anisopoikilocytosis     Comment: Reviewed by Joshua B. Kish, M.D. [id number]   Glucose, capillary     Status: Abnormal   Collection Time: 03/23/17  7:48 AM  Result Value Ref Range   Glucose-Capillary 166 (H) 65 - 99 mg/dL  Glucose, capillary     Status: Abnormal   Collection Time: 03/23/17 12:32 PM  Result Value Ref Range   Glucose-Capillary 172 (H) 65 - 99 mg/dL  Glucose, capillary     Status: Abnormal   Collection Time: 03/23/17 10:00 PM  Result Value Ref Range   Glucose-Capillary 208 (H) 65 - 99 mg/dL  CBC with Differential/Platelet     Status: Abnormal   Collection Time: 03/24/17  4:46 AM  Result Value Ref Range   WBC 8.7 4.0 - 10.5 K/uL   RBC 3.41 (L) 4.22 - 5.81 MIL/uL   Hemoglobin 10.5 (L) 13.0 - 17.0 g/dL   HCT 31.6 (L) 39.0 - 52.0 %   MCV 92.7 78.0 - 100.0 fL   MCH 30.8 26.0 - 34.0 pg   MCHC 33.2 30.0 - 36.0 g/dL   RDW 16.3 (H) 11.5 - 15.5 %   Platelets 31 (L) 150 - 400 K/uL    Comment: CONSISTENT WITH PREVIOUS RESULT   Neutrophils Relative % 66 %   Neutro Abs 5.7   1.7 - 7.7 K/uL   Lymphocytes Relative 18 %   Lymphs Abs 1.6 0.7 - 4.0 K/uL   Monocytes Relative 10 %   Monocytes Absolute 0.9 0.1 - 1.0 K/uL   Eosinophils Relative 6 %   Eosinophils Absolute 0.5 0.0 - 0.7 K/uL   Basophils Relative 0 %   Basophils Absolute 0.0 0.0 - 0.1 K/uL  Basic metabolic panel     Status: Abnormal   Collection Time: 03/24/17  4:46 AM  Result Value Ref Range   Sodium 133 (L) 135 - 145 mmol/L   Potassium 4.3 3.5 - 5.1 mmol/L   Chloride 95 (L) 101 - 111 mmol/L   CO2 29 22 - 32 mmol/L   Glucose, Bld 106 (H) 65 - 99 mg/dL   BUN 33 (H) 6 - 20 mg/dL   Creatinine, Ser 8.10 (H) 0.61 - 1.24 mg/dL     Comment: DELTA CHECK NOTED DIALYSIS    Calcium 9.1 8.9 - 10.3 mg/dL   GFR calc non Af Amer 6 (L) >60 mL/min   GFR calc Af Amer 7 (L) >60 mL/min    Comment: (NOTE) The eGFR has been calculated using the CKD EPI equation. This calculation has not been validated in all clinical situations. eGFR's persistently <60 mL/min signify possible Chronic Kidney Disease.    Anion gap 9 5 - 15  Hepatic function panel     Status: Abnormal   Collection Time: 03/24/17  4:46 AM  Result Value Ref Range   Total Protein 7.2 6.5 - 8.1 g/dL   Albumin 2.6 (L) 3.5 - 5.0 g/dL   AST 22 15 - 41 U/L   ALT 18 17 - 63 U/L   Alkaline Phosphatase 56 38 - 126 U/L   Total Bilirubin 0.7 0.3 - 1.2 mg/dL   Bilirubin, Direct 0.1 0.1 - 0.5 mg/dL   Indirect Bilirubin 0.6 0.3 - 0.9 mg/dL  Glucose, capillary     Status: Abnormal   Collection Time: 03/24/17  7:40 AM  Result Value Ref Range   Glucose-Capillary 114 (H) 65 - 99 mg/dL  Glucose, capillary     Status: Abnormal   Collection Time: 03/24/17 11:55 AM  Result Value Ref Range   Glucose-Capillary 173 (H) 65 - 99 mg/dL  Glucose, capillary     Status: Abnormal   Collection Time: 03/24/17  5:28 PM  Result Value Ref Range   Glucose-Capillary 102 (H) 65 - 99 mg/dL   @BRIEFLABTABLE(sdes,specrequest,cult,reptstatus)   ) Recent Results (from the past 720 hour(s))  Clostridium difficile EIA     Status: None   Collection Time: 03/06/17  9:01 AM  Result Value Ref Range Status   C difficile Toxins A+B, EIA Negative Negative Final  Culture, blood (Routine x 2)     Status: Abnormal   Collection Time: 03/21/17  2:55 PM  Result Value Ref Range Status   Specimen Description BLOOD RIGHT ANTECUBITAL  Final   Special Requests BOTTLES DRAWN AEROBIC AND ANAEROBIC  BCAV  Final   Culture  Setup Time   Final    GRAM POSITIVE COCCI IN CHAINS IN PAIRS AEROBIC BOTTLE ONLY Organism ID to follow CRITICAL RESULT CALLED TO, READ BACK BY AND VERIFIED WITH: A MEYER PHARMD [id number] 03/22/17  A BROWNING    Culture (A)  Final    ROTHIA SPECIES Standardized susceptibility testing for this organism is not available.    Report Status 03/24/2017 FINAL  Final  Blood Culture ID Panel (Reflexed)     Status: None     Collection Time: 03/21/17  2:55 PM  Result Value Ref Range Status   Enterococcus species NOT DETECTED NOT DETECTED Final   Listeria monocytogenes NOT DETECTED NOT DETECTED Final   Staphylococcus species NOT DETECTED NOT DETECTED Final   Staphylococcus aureus NOT DETECTED NOT DETECTED Final   Streptococcus species NOT DETECTED NOT DETECTED Final   Streptococcus agalactiae NOT DETECTED NOT DETECTED Final   Streptococcus pneumoniae NOT DETECTED NOT DETECTED Final   Streptococcus pyogenes NOT DETECTED NOT DETECTED Final   Acinetobacter baumannii NOT DETECTED NOT DETECTED Final   Enterobacteriaceae species NOT DETECTED NOT DETECTED Final   Enterobacter cloacae complex NOT DETECTED NOT DETECTED Final   Escherichia coli NOT DETECTED NOT DETECTED Final   Klebsiella oxytoca NOT DETECTED NOT DETECTED Final   Klebsiella pneumoniae NOT DETECTED NOT DETECTED Final   Proteus species NOT DETECTED NOT DETECTED Final   Serratia marcescens NOT DETECTED NOT DETECTED Final   Haemophilus influenzae NOT DETECTED NOT DETECTED Final   Neisseria meningitidis NOT DETECTED NOT DETECTED Final   Pseudomonas aeruginosa NOT DETECTED NOT DETECTED Final   Candida albicans NOT DETECTED NOT DETECTED Final   Candida glabrata NOT DETECTED NOT DETECTED Final   Candida krusei NOT DETECTED NOT DETECTED Final   Candida parapsilosis NOT DETECTED NOT DETECTED Final   Candida tropicalis NOT DETECTED NOT DETECTED Final  Culture, blood (Routine x 2)     Status: None (Preliminary result)   Collection Time: 03/21/17  3:00 PM  Result Value Ref Range Status   Specimen Description BLOOD RIGHT HAND  Final   Special Requests BOTTLES DRAWN AEROBIC ONLY  BCAV  Final   Culture NO GROWTH 3 DAYS  Final   Report Status  PENDING  Incomplete  MRSA PCR Screening     Status: None   Collection Time: 03/22/17  5:23 AM  Result Value Ref Range Status   MRSA by PCR NEGATIVE NEGATIVE Final    Comment:        The GeneXpert MRSA Assay (FDA approved for NASAL specimens only), is one component of a comprehensive MRSA colonization surveillance program. It is not intended to diagnose MRSA infection nor to guide or monitor treatment for MRSA infections.   Urine culture     Status: Abnormal   Collection Time: 03/22/17  2:20 PM  Result Value Ref Range Status   Specimen Description URINE, CLEAN CATCH  Final   Special Requests NONE  Final   Culture <10,000 COLONIES/mL INSIGNIFICANT GROWTH (A)  Final   Report Status 03/23/2017 FINAL  Final  Culture, blood (routine x 2)     Status: None (Preliminary result)   Collection Time: 03/22/17  9:14 PM  Result Value Ref Range Status   Specimen Description BLOOD RIGHT ANTECUBITAL  Final   Special Requests   Final    BOTTLES DRAWN AEROBIC AND ANAEROBIC Blood Culture adequate volume   Culture NO GROWTH 2 DAYS  Final   Report Status PENDING  Incomplete  Culture, blood (routine x 2)     Status: None (Preliminary result)   Collection Time: 03/22/17  9:18 PM  Result Value Ref Range Status   Specimen Description BLOOD RIGHT HAND  Final   Special Requests IN PEDIATRIC BOTTLE Blood Culture adequate volume  Final   Culture NO GROWTH 2 DAYS  Final   Report Status PENDING  Incomplete     Impression/Recommendation  Active Problems:   Sepsis (HCC)   Other pancytopenia (H[facility])   Febrile illness   Gross hematuria   Bacteremia     RUQ abdominal pain   [name] is a 65 y.o. male with  Multiple myeloma, chronic kidney disease on hemodialysis who experienced fevers and chills during dialysis and was admitted the hospital and placed on vancomycin and Zosyn after blood cultures were obtained blood cultures have revealed a Rothia species in one of 2  Sites  #1 Rothia  bacteremia:  This is likely a TRUE pathogen  Will continue with IV vancomycin since it is active against this organism and will be convenient to dose with hemodialysis.  Repeat blood cultures are not showing any organisms growing.  I did obtain a Panorex of the teeth in addition to CT scan obtained above.  I would obtain a 2-D echocardiogram and then try to get a transesophageal echocardiogram on Monday.     03/24/2017, 7:51 PM   Thank you so much for this interesting consult  [facility] [phone] (pager) [phone] (office) 03/24/2017, 7:51 PM  [name] 03/24/2017, 7:51 PM    

## 2017-03-24 NOTE — Progress Notes (Signed)
Labs reviewed this am. His CBC is not changed from 03/23/2017. His white cell count and differential remains normal. Hemoglobin is close to his baseline.  His platelets remained low which I suspect is a reactive process related to his acute illness. For the time being, I recommend continued observation and monitoring his platelet counts. No need for transfusions as this time.   His serum protein electrophoresis was also reviewed and his M spike is not dramatically different than it was felt last 9 years. His M spike is less than 1 g/dL which is consistent and unchanged. These findings do not suggest evolving plasma cell disorder such as multiple myeloma.

## 2017-03-25 DIAGNOSIS — R509 Fever, unspecified: Secondary | ICD-10-CM

## 2017-03-25 DIAGNOSIS — Z992 Dependence on renal dialysis: Secondary | ICD-10-CM

## 2017-03-25 DIAGNOSIS — D61818 Other pancytopenia: Secondary | ICD-10-CM

## 2017-03-25 DIAGNOSIS — A419 Sepsis, unspecified organism: Principal | ICD-10-CM

## 2017-03-25 DIAGNOSIS — N186 End stage renal disease: Secondary | ICD-10-CM

## 2017-03-25 LAB — GLUCOSE, CAPILLARY
GLUCOSE-CAPILLARY: 137 mg/dL — AB (ref 65–99)
GLUCOSE-CAPILLARY: 175 mg/dL — AB (ref 65–99)
Glucose-Capillary: 137 mg/dL — ABNORMAL HIGH (ref 65–99)

## 2017-03-25 LAB — CBC
HEMATOCRIT: 28.5 % — AB (ref 39.0–52.0)
Hemoglobin: 9.1 g/dL — ABNORMAL LOW (ref 13.0–17.0)
MCH: 29.6 pg (ref 26.0–34.0)
MCHC: 31.9 g/dL (ref 30.0–36.0)
MCV: 92.8 fL (ref 78.0–100.0)
PLATELETS: 66 10*3/uL — AB (ref 150–400)
RBC: 3.07 MIL/uL — ABNORMAL LOW (ref 4.22–5.81)
RDW: 16.6 % — ABNORMAL HIGH (ref 11.5–15.5)
WBC: 8.8 10*3/uL (ref 4.0–10.5)

## 2017-03-25 LAB — RENAL FUNCTION PANEL
ANION GAP: 10 (ref 5–15)
Albumin: 2.7 g/dL — ABNORMAL LOW (ref 3.5–5.0)
BUN: 43 mg/dL — ABNORMAL HIGH (ref 6–20)
CALCIUM: 9.3 mg/dL (ref 8.9–10.3)
CHLORIDE: 96 mmol/L — AB (ref 101–111)
CO2: 26 mmol/L (ref 22–32)
Creatinine, Ser: 10.42 mg/dL — ABNORMAL HIGH (ref 0.61–1.24)
GFR calc non Af Amer: 4 mL/min — ABNORMAL LOW (ref >60)
GFR, EST AFRICAN AMERICAN: 5 mL/min — AB (ref >60)
Glucose, Bld: 115 mg/dL — ABNORMAL HIGH (ref 65–99)
POTASSIUM: 4.2 mmol/L (ref 3.5–5.1)
Phosphorus: 5.1 mg/dL — ABNORMAL HIGH (ref 2.5–4.6)
SODIUM: 132 mmol/L — AB (ref 135–145)

## 2017-03-25 LAB — HEPATITIS C ANTIBODY: HCV Ab: <0.1 s/co ratio (ref 0.0–0.9)

## 2017-03-25 LAB — HEPATITIS B SURFACE ANTIGEN: Hepatitis B Surface Ag: NEGATIVE

## 2017-03-25 MED ORDER — CALCITRIOL 0.25 MCG PO CAPS
ORAL_CAPSULE | ORAL | Status: AC
  Administered 2017-03-25: 1.75 ug via ORAL
  Filled 2017-03-25: qty 7

## 2017-03-25 MED ORDER — VANCOMYCIN HCL IN DEXTROSE 1-5 GM/200ML-% IV SOLN
INTRAVENOUS | Status: AC
  Administered 2017-03-25: 1000 mg via INTRAVENOUS
  Filled 2017-03-25: qty 200

## 2017-03-25 MED ORDER — HEPARIN SODIUM (PORCINE) 5000 UNIT/ML IJ SOLN
5000.0000 [IU] | Freq: Three times a day (TID) | INTRAMUSCULAR | Status: DC
  Administered 2017-03-25 – 2017-03-28 (×10): 5000 [IU] via SUBCUTANEOUS
  Filled 2017-03-25 (×10): qty 1

## 2017-03-25 MED ORDER — ASPIRIN EC 81 MG PO TBEC
81.0000 mg | DELAYED_RELEASE_TABLET | Freq: Every morning | ORAL | Status: DC
Start: 2017-03-26 — End: 2017-03-28
  Administered 2017-03-26 – 2017-03-28 (×3): 81 mg via ORAL
  Filled 2017-03-25 (×3): qty 1

## 2017-03-25 MED ORDER — PRO-STAT SUGAR FREE PO LIQD
30.0000 mL | Freq: Every day | ORAL | Status: DC
  Administered 2017-03-25 – 2017-03-28 (×4): 30 mL via ORAL
  Filled 2017-03-25 (×4): qty 30

## 2017-03-25 NOTE — Progress Notes (Signed)
Patient with negative HIT panel and no longer with severe thrombocytopenia. Discussed with attending Dr. Andria Frames. Will start SQ heparin at VTE prophylaxis dosing.   Olene Floss, MD [place], PGY-2

## 2017-03-25 NOTE — Progress Notes (Signed)
Initial Nutrition Assessment  DOCUMENTATION CODES:   Obesity unspecified  INTERVENTION:  Continue Nepro Shake po BID, each supplement provides 425 kcal and 19 grams protein.  Provide 30 ml Prostat po once daily, each supplement provides 100 kcal and 15 grams of protein.   Encourage adequate PO intake.   NUTRITION DIAGNOSIS:   Increased nutrient needs related to chronic illness as evidenced by estimated needs.  GOAL:   Patient will meet greater than or equal to 90% of their needs  MONITOR:   PO intake, Supplement acceptance, Labs, Weight trends, Skin, I & O's  REASON FOR ASSESSMENT:   Consult Assessment of nutrition requirement/status  ASSESSMENT:   66 y.o., here with fever and weakness, meeting SIRS criteria. Source is currently unclear. Questionable diagnosis of new Afib on ED EKG, however has been NSR on telemetry. PMH is significant for diastolic CHF, ESRD on Dialysis, MGUS, DM, HTN, HLD, Asthma, morbid obesity, lymphedema, and venous insufficiency  Pt reports having a good appetite currently and PTA with no other difficulties. Meal completion has been varied from 25-100%. Pt reports he sometimes does not finish his meals as he gets interrupted needing to go to a procedure or test. He reports mostly consuming 100% of meals. Pt endorses consuming a half of a bottle of Special K protein shakes daily. Pt currently has Nepro Shake ordered with varied consumption. RD to continue with current orders. RD to additionally order Prostat to aid in adequate protein. Weight has been fluctuating per weight records, however mostly stable.   Pt with no observed significant fat or muscle mass loss.   Labs and medications reviewed. Phosphorous elevated at 5.1.  Diet Order:  Diet renal with fluid restriction Fluid restriction: 1200 mL Fluid; Room service appropriate? Yes; Fluid consistency: Thin  Skin:  Reviewed, no issues  Last BM:  4/13  Height:   Ht Readings from Last 1  Encounters:  03/22/17 5\' 11"  (1.803 m)    Weight:   Wt Readings from Last 1 Encounters:  03/25/17 266 lb 12.1 oz (121 kg)    Ideal Body Weight:  78 kg  BMI:  Body mass index is 37.21 kg/m.  Estimated Nutritional Needs:   Kcal:  2200-2500  Protein:  115-125 grams  Fluid:  1.2 L/day  EDUCATION NEEDS:   No education needs identified at this time  Corrin Parker, MS, RD, LDN Pager # [phone] After hours/ weekend pager # [phone]

## 2017-03-25 NOTE — Progress Notes (Signed)
Family Medicine Teaching Service Daily Progress Note Intern Pager: [phone]  Patient name: [name] Medical record number: [id number] Date of birth: Jan 17, 1951 Age: 66 y.o. Gender: male  Primary Care Provider: Georges Lynch, MD Consultants: Nephro, Cardiology Code Status: Full  Pt Overview and Major Events to Date:  4/10- Admited to [place] 4/11- Rothia grew in 1/2 blood cultures  Assessment and Plan: [name] a 66 y.o., here with fever and weakness, meeting SIRS criteria. Source is currently unclear. Questionable diagnosis of new Afib on ED EKG, however has been NSR on telemetry. PMH is significant for diastolic CHF, ESRD on Dialysis, MGUS, DM, HTN, HLD, Asthma, morbid obesity, lymphedema, and venous insufficiency  Bacteremia:Vitals are stable and afebrile.  Blood Cx 4/10: GPC in pairs and chains on gram stain, with rothia species in 1/2 bottles. Possible contaminant. Blood cx 4/11: NG x 2 days. Suspected dental source. Urine culture with insignificant growth. CT neck without mass or fluid collection but left supraclavicular and right pericardial lymphadenopathy present. Orthopantogram with caries within right-sided supererupted maxillary molar teeth with 1-2 remaining root tips in the region of the premolars on the right as possible infectious source. TTE did not show evidence of vegetations.  - ID consulted, appreciate recommendations: Continue IV vancomycin to cover for Rothia until obtain TEE. If Cards unable to do TEE would continue IV vancomycin for 4 weeks, dosing with HD. - follow blood cultures   Gross Hematuria, resolved: After I/O cath in the ED. Likely traumatic.  - Repeat UA  Sinus Bradycardia: Asymptomatic  - Holding Coreg  RUQ Abdominal Pain, resolved: started while in HD on day of admission. Not associated with food. No history of similar symptoms. Unclear in etiology.  - RUQ [place] --> Cholelithiasis without acute cholecystitis or biliary duct dilatation. -  Hepatic function panel WNL - Hepatitis panel pending  New Afib, resolved: noted in EKG obtained in ED. Now NSR. CHADSVASC 4. Per cards, no anticoagulation in the setting of thrombocytopenia. Also would not start until after TEE. TSH normal - cards consults, appreciate recommendations: will ask if patient needs to be on anticoag in the future or if this episode is due to sepsis  - Spoke with pharmacy about given afib-dosed lovenox, and they recommended heparin drip instead due to ESRD as HIT panel negative. Will await UA to check for hematuria and consider starting 4/15.   Elevated troponin: 0.45 >0.46 >0.42 > 0.34. Flat, likely in the setting of ESRD with new onset afib. Unlikely ACS per cardiology - Restart ASA 81 mg daily if UA without blood - ECHO with LVEF 55-60%, mild LVH, G2DD  Thrombocytopenia/Anemia:  Likely reactive in the setting of acute infection/stress. Hgb is stable/improving. Plt improving. HIT panel negative - peripheral smear unremarkalbe - heme-onc consult: likely reactive due to acute illness. SPEP is unchanged from prior. If his platelet counts continues to rise in the next 24-48 hours, no further workup is needed at this time. Will arrange follow up.  ESRD on HD:  - nephrology consulted: HD today  Asthma: no sign of exacerbation - PRN duonebs  DM2:HgbA1c most recently 6.8 in 03/13/2017. Diet controlled . Glucose stable on BMP - monitor glucose on BMP  HTN - Elevated prior to HD-- consider restarting norvasc is still elevated later today. home regimen norvasc 10 mg QD, Coreg 25 mg QD, Lasix - off coreg 2/2 bradycardia, had held norvasc as was normotensive on admission  Hx of CHF: Echo on 06/25/2015 showing no signs of CHF with  EF of55%. 1+ LE to mid-shin on exam 4/14. - Continue Lasix - holding home cored  HLD - Last lipid panel 03/13/2017 - Cholesterol 184, HDL 29, LDL 115, Triglyceride 201 - statin recommended by PCP at last outpatient visit - patient had  adamantly refused statin - can continue to counsel  Morbid Obesity - weight loss counseling  Lymphedema: Chronic significant skin changes. - urea lotion   MGUS with smoldering multiple myeloma: Last seen by oncology in 06/2014 and patient was lost to follow up per chart review. SPEP unchanged from prior. Seen by oncology during hospitalization   Hypoalbuminemia: Albumin 2.7 - Will order nutrition consult  FEN/GI: Renal diet, SLIV PPx: SCDs, consider heparin drip due to bout of afib and ESRD 4/15 if UA without hematuria  Disposition: pending TEE.   Subjective:  RUQ pain improved. Denies complaints this am. Tolerated breakfast.   Objective: Temp:  [97.3 F (36.3 C)-98.7 F (37.1 C)] 97.3 F (36.3 C) (04/14 0715) Pulse Rate:  [48-62] 55 (04/14 0830) Resp:  [15-20] 19 (04/14 0830) BP: (161-195)/(56-87) 189/86 (04/14 0830) SpO2:  [98 %-100 %] 98 % (04/14 0715) Weight:  [271 lb 1.6 oz (123 kg)-273 lb 5.9 oz (124 kg)] 273 lb 5.9 oz (124 kg) (04/14 0715) Physical Exam: General: Well appearing, NAD, lying in HD bed  Cardiovascular: RRR, no MRG Respiratory: CTAB. No increased work of breathing.   Abdomen: Soft, +BS, NT, ND Extremities: BL 5th toe amputations. BLE pitting edema. HD fistula on the L arm. No erythema, edema, increased warmth or discharge around fistula present.   Laboratory:  Recent Labs Lab 03/23/17 0455 03/24/17 0446 03/25/17 0736  WBC 7.7 8.7 8.8  HGB 9.1* 10.5* 9.1*  HCT 27.8* 31.6* 28.5*  PLT 33* 31* 66*    Recent Labs Lab 03/21/17 1450  03/23/17 0455 03/24/17 0446 03/25/17 0736  NA 138  < > 134* 133* 132*  K 4.1  < > 4.6 4.3 4.2  CL 99*  < > 96* 95* 96*  CO2 26  < > '25 29 26  ' BUN 37*  < > 68* 33* 43*  CREATININE 10.82*  < > 12.85* 8.10* 10.42*  CALCIUM 8.5*  < > 8.7* 9.1 9.3  PROT 7.0  --   --  7.2  --   BILITOT 1.1  --   --  0.7  --   ALKPHOS 57  --   --  56  --   ALT 15*  --   --  18  --   AST 29  --   --  22  --   GLUCOSE 123*  < >  173* 106* 115*  < > = values in this interval not displayed. TSH: 3.436 MRSA by PCR: Negative    Blood Cx 4/10: GPC in pairs and chains on gram stain 1/2 bottles Blood cx 4/11: NG x 2 days  Urine cx: insignificant growth   Imaging/Diagnostic Tests: Dg Orthopantogram  Result Date: 03/25/2017 CLINICAL DATA:  Bacteremia EXAM: ORTHOPANTOGRAM/PANORAMIC COMPARISON:  None. FINDINGS: A Panorex study was provided. Near the edentulous mandibular ridges are seen bilaterally. Missing right lower central canine. Super erupted right-sided upper molar teeth with caries noted. Remaining root tips are noted just anterior to this on the right. These may harbor areas of infection/ sites of bacteremia. Numerous missing teeth in the left upper maxilla as well without significant carie formation. IMPRESSION: Caries within right-sided supererupted maxillary molar teeth with 1 and possibly 2 remaining root tips in the region  of the premolars on the right. These are potential sites of bacteremia. Electronically Signed   By: Ashley Royalty M.D.   On: 03/25/2017 01:58   Ct Soft Tissue Neck W Contrast  Result Date: 03/24/2017 CLINICAL DATA:  66 y/o M; bacteremia, neck pain, and knot on the neck. EXAM: CT NECK WITH CONTRAST TECHNIQUE: Multidetector CT imaging of the neck was performed using the standard protocol following the bolus administration of intravenous contrast. CONTRAST:  59m ISOVUE-300 IOPAMIDOL (ISOVUE-300) INJECTION 61% COMPARISON:  None. FINDINGS: Pharynx and larynx: Normal. No mass or swelling. Salivary glands: No inflammation, mass, or stone. Thyroid: Normal. Lymph nodes: Left supraclavicular lymphadenopathy measuring 15 x 15 x 18 mm (series 3, image 77). Right pericardial masses, probably lymphadenopathy is measuring up to 21 x 16 mm (series 3, image 128). Vascular: Moderate calcific atherosclerosis of the aortic arch. Calcified plaque of the bilateral carotid bifurcations without high-grade stenosis. Dense  calcification of the intracranial vertebral artery is with probable high-grade underlying stenosis. Dense calcification of bilateral cavernous internal carotid arteries, partially visualized. Limited intracranial: Negative. Visualized orbits: Visualized portions are negative. Mastoids and visualized paranasal sinuses: Moderate left and mild right maxillary sinus mucosal thickening. Skeleton: Advanced cervical degenerative changes with endplate eburnation at the C5-6 and C6-7 levels and at multiple upper cervical right-sided facet joints probably representing renal spondyloarthropathy. Upper chest: Negative. Other: None. IMPRESSION: 1. No discrete mass or collection of the neck identified. 2. Left supraclavicular and right pericardial lymphadenopathy. This may be due to systemic infection, lymphoproliferative disease, or possibly metastatic disease of uncertain primary. 3. Advanced cervical spine degenerative changes with finding of renal spondyloarthropathy. Electronically Signed   By: LKristine GarbeM.D.   On: 03/24/2017 19:36   [place]dom[place] Limited Ruq  Result Date: 03/24/2017 CLINICAL DATA:  Right upper quadrant pain since yesterday. EXAM: [place]DOM[place] LIMITED - RIGHT UPPER QUADRANT COMPARISON:  Renal ultrasound 02/08/2014.  CT 06/04/2009. FINDINGS: Gallbladder: Gallstones of up to 9 mm. No pericholecystic fluid or wall thickening. Sonographic Murphy's sign was not elicited. Common bile duct: Diameter: Normal, 6 mm. Liver: No focal lesion identified. Within normal limits in parenchymal echogenicity. Mildly limited exam secondary to patient body habitus. IMPRESSION: Cholelithiasis without acute cholecystitis or biliary duct dilatation. Mildly limited exam secondary to patient body habitus. Electronically Signed   By: KAbigail MiyamotoM.D.   On: 03/24/2017 16:57    Daurice Ovando MCorinda Gubler MD  PGY-2, CFountain [facility]Family Medicine 03/25/2017, 8:39 AM

## 2017-03-25 NOTE — Progress Notes (Addendum)
Subjective: No new complaints, getting hemodialysis. Neck pain has disappeared   Antibiotics:  Anti-infectives    Start     Dose/Rate Route Frequency Ordered Stop   03/23/17 1200  vancomycin (VANCOCIN) IVPB 1000 mg/200 mL premix     1,000 mg 200 mL/hr over 60 Minutes Intravenous Every T-Th-Sa (Hemodialysis) 03/23/17 0859     03/22/17 0300  piperacillin-tazobactam (ZOSYN) IVPB 3.375 g  Status:  Discontinued     3.375 g 12.5 mL/hr over 240 Minutes Intravenous Every 12 hours 03/21/17 1533 03/24/17 1506   03/21/17 1530  piperacillin-tazobactam (ZOSYN) IVPB 3.375 g     3.375 g 100 mL/hr over 30 Minutes Intravenous  Once 03/21/17 1518 03/21/17 1658   03/21/17 1530  vancomycin (VANCOCIN) IVPB 1000 mg/200 mL premix  Status:  Discontinued     1,000 mg 200 mL/hr over 60 Minutes Intravenous  Once 03/21/17 1518 03/21/17 1528   03/21/17 1530  vancomycin (VANCOCIN) 2,000 mg in sodium chloride 0.9 % 500 mL IVPB     2,000 mg 250 mL/hr over 120 Minutes Intravenous  Once 03/21/17 1528 03/21/17 1821      Medications: Scheduled Meds: . calcitRIOL  1.75 mcg Oral Q T,Th,Sa-HD  . calcium acetate  2,001 mg Oral TID WC  . darbepoetin (ARANESP) injection - DIALYSIS  60 mcg Intravenous Q Thu-HD  . dextromethorphan-guaiFENesin  1 tablet Oral BID  . feeding supplement (NEPRO CARB STEADY)  237 mL Oral BID BM  . furosemide  80 mg Oral BID  . multivitamin  1 tablet Oral QHS  . sodium chloride flush  3 mL Intravenous Q12H  . urea   Topical BID  . vancomycin  1,000 mg Intravenous Q T,Th,Sa-HD   Continuous Infusions: PRN Meds:.sodium chloride, acetaminophen **OR** acetaminophen, ipratropium-albuterol, ondansetron **OR** ondansetron (ZOFRAN) IV, polyethylene glycol, sodium chloride flush    Objective: Weight change: -5 lb 5.7 oz (-2.43 kg)  Intake/Output Summary (Last 24 hours) at 03/25/17 1130 Last data filed at 03/25/17 8182  Gross per 24 hour  Intake              200 ml  Output               800 ml  Net             -600 ml   Blood pressure (!) 163/50, pulse (!) 51, temperature 97.3 F (36.3 C), temperature source Oral, resp. rate 17, height '5\' 11"'  (1.803 m), weight 273 lb 5.9 oz (124 kg), SpO2 98 %. Temp:  [97.3 F (36.3 C)-98.7 F (37.1 C)] 97.3 F (36.3 C) (04/14 0715) Pulse Rate:  [48-62] 51 (04/14 1100) Resp:  [15-20] 17 (04/14 1100) BP: (123-199)/(50-95) 163/50 (04/14 1100) SpO2:  [98 %-100 %] 98 % (04/14 0715) Weight:  [271 lb 1.6 oz (123 kg)-273 lb 5.9 oz (124 kg)] 273 lb 5.9 oz (124 kg) (04/14 0715)  Physical Exam: General: Alert and awake, oriented x3, not in any acute distress getting HD HEENT: anicteric sclera,  EOMI, oropharynx clear and without exudate, several missing teeth and caries Cardiovascular:rregular rate, normal r,  no murmur rubs or gallops Pulmonary: clear to auscultation bilaterally, no wheezing, rales or rhonchi Gastrointestinal: soft nontender, nondistended, normal bowel sounds, Musculoskeletal: no  clubbing or edema noted bilaterally Skin, soft tissue: no rashes, graft site is clean Neuro: nonfocal, strength and sensation intact  CBC: CBC Latest Ref Rng & Units 03/25/2017 03/24/2017 03/23/2017  WBC 4.0 - 10.5 K/uL 8.8 8.7 7.7  Hemoglobin 13.0 - 17.0 g/dL 9.1(L) 10.5(L) 9.1(L)  Hematocrit 39.0 - 52.0 % 28.5(L) 31.6(L) 27.8(L)  Platelets 150 - 400 K/uL 66(L) 31(L) 33(L)      BMET  Recent Labs  03/24/17 0446 03/25/17 0736  NA 133* 132*  K 4.3 4.2  CL 95* 96*  CO2 29 26  GLUCOSE 106* 115*  BUN 33* 43*  CREATININE 8.10* 10.42*  CALCIUM 9.1 9.3     Liver Panel   Recent Labs  03/24/17 0446 03/25/17 0736  PROT 7.2  --   ALBUMIN 2.6* 2.7*  AST 22  --   ALT 18  --   ALKPHOS 56  --   BILITOT 0.7  --   BILIDIR 0.1  --   IBILI 0.6  --        Sedimentation Rate No results for input(s): ESRSEDRATE in the last 72 hours. C-Reactive Protein No results for input(s): CRP in the last 72 hours.  Micro Results: Recent  Results (from the past 720 hour(s))  Clostridium difficile EIA     Status: None   Collection Time: 03/06/17  9:01 AM  Result Value Ref Range Status   C difficile Toxins A+B, EIA Negative Negative Final  Culture, blood (Routine x 2)     Status: Abnormal   Collection Time: 03/21/17  2:55 PM  Result Value Ref Range Status   Specimen Description BLOOD RIGHT ANTECUBITAL  Final   Special Requests BOTTLES DRAWN AEROBIC AND ANAEROBIC  BCAV  Final   Culture  Setup Time   Final    GRAM POSITIVE COCCI IN CHAINS IN PAIRS AEROBIC BOTTLE ONLY Organism ID to follow CRITICAL RESULT CALLED TO, READ BACK BY AND VERIFIED WITH: A MEYER PHARMD 2025 03/22/17 A BROWNING    Culture (A)  Final    ROTHIA SPECIES Standardized susceptibility testing for this organism is not available.    Report Status 03/24/2017 FINAL  Final  Blood Culture ID Panel (Reflexed)     Status: None   Collection Time: 03/21/17  2:55 PM  Result Value Ref Range Status   Enterococcus species NOT DETECTED NOT DETECTED Final   Listeria monocytogenes NOT DETECTED NOT DETECTED Final   Staphylococcus species NOT DETECTED NOT DETECTED Final   Staphylococcus aureus NOT DETECTED NOT DETECTED Final   Streptococcus species NOT DETECTED NOT DETECTED Final   Streptococcus agalactiae NOT DETECTED NOT DETECTED Final   Streptococcus pneumoniae NOT DETECTED NOT DETECTED Final   Streptococcus pyogenes NOT DETECTED NOT DETECTED Final   Acinetobacter baumannii NOT DETECTED NOT DETECTED Final   Enterobacteriaceae species NOT DETECTED NOT DETECTED Final   Enterobacter cloacae complex NOT DETECTED NOT DETECTED Final   Escherichia coli NOT DETECTED NOT DETECTED Final   Klebsiella oxytoca NOT DETECTED NOT DETECTED Final   Klebsiella pneumoniae NOT DETECTED NOT DETECTED Final   Proteus species NOT DETECTED NOT DETECTED Final   Serratia marcescens NOT DETECTED NOT DETECTED Final   Haemophilus influenzae NOT DETECTED NOT DETECTED Final   Neisseria  meningitidis NOT DETECTED NOT DETECTED Final   Pseudomonas aeruginosa NOT DETECTED NOT DETECTED Final   Candida albicans NOT DETECTED NOT DETECTED Final   Candida glabrata NOT DETECTED NOT DETECTED Final   Candida krusei NOT DETECTED NOT DETECTED Final   Candida parapsilosis NOT DETECTED NOT DETECTED Final   Candida tropicalis NOT DETECTED NOT DETECTED Final  Culture, blood (Routine x 2)     Status: None (Preliminary result)   Collection Time: 03/21/17  3:00 PM  Result Value Ref Range  Status   Specimen Description BLOOD RIGHT HAND  Final   Special Requests BOTTLES DRAWN AEROBIC ONLY  BCAV  Final   Culture NO GROWTH 4 DAYS  Final   Report Status PENDING  Incomplete  MRSA PCR Screening     Status: None   Collection Time: 03/22/17  5:23 AM  Result Value Ref Range Status   MRSA by PCR NEGATIVE NEGATIVE Final    Comment:        The GeneXpert MRSA Assay (FDA approved for NASAL specimens only), is one component of a comprehensive MRSA colonization surveillance program. It is not intended to diagnose MRSA infection nor to guide or monitor treatment for MRSA infections.   Urine culture     Status: Abnormal   Collection Time: 03/22/17  2:20 PM  Result Value Ref Range Status   Specimen Description URINE, CLEAN CATCH  Final   Special Requests NONE  Final   Culture <10,000 COLONIES/mL INSIGNIFICANT GROWTH (A)  Final   Report Status 03/23/2017 FINAL  Final  Culture, blood (routine x 2)     Status: None (Preliminary result)   Collection Time: 03/22/17  9:14 PM  Result Value Ref Range Status   Specimen Description BLOOD RIGHT ANTECUBITAL  Final   Special Requests   Final    BOTTLES DRAWN AEROBIC AND ANAEROBIC Blood Culture adequate volume   Culture NO GROWTH 3 DAYS  Final   Report Status PENDING  Incomplete  Culture, blood (routine x 2)     Status: None (Preliminary result)   Collection Time: 03/22/17  9:18 PM  Result Value Ref Range Status   Specimen Description BLOOD RIGHT HAND   Final   Special Requests IN PEDIATRIC BOTTLE Blood Culture adequate volume  Final   Culture NO GROWTH 3 DAYS  Final   Report Status PENDING  Incomplete    Studies/Results: Dg Orthopantogram  Result Date: 03/25/2017 CLINICAL DATA:  Bacteremia EXAM: ORTHOPANTOGRAM/PANORAMIC COMPARISON:  None. FINDINGS: A Panorex study was provided. Near the edentulous mandibular ridges are seen bilaterally. Missing right lower central canine. Super erupted right-sided upper molar teeth with caries noted. Remaining root tips are noted just anterior to this on the right. These may harbor areas of infection/ sites of bacteremia. Numerous missing teeth in the left upper maxilla as well without significant carie formation. IMPRESSION: Caries within right-sided supererupted maxillary molar teeth with 1 and possibly 2 remaining root tips in the region of the premolars on the right. These are potential sites of bacteremia. Electronically Signed   By: Ashley Royalty M.D.   On: 03/25/2017 01:58   Ct Soft Tissue Neck W Contrast  Result Date: 03/24/2017 CLINICAL DATA:  66 y/o M; bacteremia, neck pain, and knot on the neck. EXAM: CT NECK WITH CONTRAST TECHNIQUE: Multidetector CT imaging of the neck was performed using the standard protocol following the bolus administration of intravenous contrast. CONTRAST:  76m ISOVUE-300 IOPAMIDOL (ISOVUE-300) INJECTION 61% COMPARISON:  None. FINDINGS: Pharynx and larynx: Normal. No mass or swelling. Salivary glands: No inflammation, mass, or stone. Thyroid: Normal. Lymph nodes: Left supraclavicular lymphadenopathy measuring 15 x 15 x 18 mm (series 3, image 77). Right pericardial masses, probably lymphadenopathy is measuring up to 21 x 16 mm (series 3, image 128). Vascular: Moderate calcific atherosclerosis of the aortic arch. Calcified plaque of the bilateral carotid bifurcations without high-grade stenosis. Dense calcification of the intracranial vertebral artery is with probable high-grade  underlying stenosis. Dense calcification of bilateral cavernous internal carotid arteries, partially visualized. Limited intracranial: Negative.  Visualized orbits: Visualized portions are negative. Mastoids and visualized paranasal sinuses: Moderate left and mild right maxillary sinus mucosal thickening. Skeleton: Advanced cervical degenerative changes with endplate eburnation at the C5-6 and C6-7 levels and at multiple upper cervical right-sided facet joints probably representing renal spondyloarthropathy. Upper chest: Negative. Other: None. IMPRESSION: 1. No discrete mass or collection of the neck identified. 2. Left supraclavicular and right pericardial lymphadenopathy. This may be due to systemic infection, lymphoproliferative disease, or possibly metastatic disease of uncertain primary. 3. Advanced cervical spine degenerative changes with finding of renal spondyloarthropathy. Electronically Signed   By: Kristine Garbe M.D.   On: 03/24/2017 19:36   US Abdomen Limited Ruq  Result Date: 03/24/2017 CLINICAL DATA:  Right upper quadrant pain since yesterday. EXAM: US ABDOMEN LIMITED - RIGHT UPPER QUADRANT COMPARISON:  Renal ultrasound 02/08/2014.  CT 06/04/2009. FINDINGS: Gallbladder: Gallstones of up to 9 mm. No pericholecystic fluid or wall thickening. Sonographic Murphy's sign was not elicited. Common bile duct: Diameter: Normal, 6 mm. Liver: No focal lesion identified. Within normal limits in parenchymal echogenicity. Mildly limited exam secondary to patient body habitus. IMPRESSION: Cholelithiasis without acute cholecystitis or biliary duct dilatation. Mildly limited exam secondary to patient body habitus. Electronically Signed   By: Abigail Miyamoto M.D.   On: 03/24/2017 16:57      Assessment/Plan:  INTERVAL HISTORY: Panorex reviewed and shows mx areas of concern   Active Problems:   Sepsis ([place])   Other pancytopenia ([facility])   Febrile illness   Gross hematuria   Bacteremia   RUQ  abdominal pain   Elevated lactic acid level    WANE MOLLETT is a 66 y.o. male with  Multiple myeloma, chronic kidney disease on hemodialysis who experienced fevers and chills during dialysis and was admitted the hospital and placed on vancomycin and Zosyn after blood cultures were obtained blood cultures have revealed a Rothia species in one of 2  Sites  #1 Rothia bacteremia:  This is likely a TRUE pathogen  Will continue with IV vancomycin since it is active against this organism and will be convenient to dose with hemodialysis.  Repeat blood cultures are not showing any organisms growing  He needs his teeth addressed by dentist and or oral surgeon (can also be done as outpatient)  Would touch base with Cardiology  If they feel safe  doing TEE  (he is TTpenic and also on anticoagulation) would get one to investigate for endocarditis.   IF NOT then would give Vancomycin IV with HD x 4 weeks  We can arrange for ID followup in 6 weeks and check surveillance blood cultures     LOS: 4 days   Alcide Evener 03/25/2017, 11:30 AM

## 2017-03-25 NOTE — Procedures (Signed)
Pt seen on HD.  Note Rothia bacteremia.  WU for source neg so far except for teeth.  He feels well.  Ap 200 Vp 170  BFR 400.  Vanco can be given as outpt...? For ID is how long?   Home when OK with ID.

## 2017-03-26 DIAGNOSIS — R7881 Bacteremia: Secondary | ICD-10-CM

## 2017-03-26 LAB — CBC
HCT: 29.6 % — ABNORMAL LOW (ref 39.0–52.0)
Hemoglobin: 9.4 g/dL — ABNORMAL LOW (ref 13.0–17.0)
MCH: 30.1 pg (ref 26.0–34.0)
MCHC: 31.8 g/dL (ref 30.0–36.0)
MCV: 94.9 fL (ref 78.0–100.0)
Platelets: 94 10*3/uL — ABNORMAL LOW (ref 150–400)
RBC: 3.12 MIL/uL — AB (ref 4.22–5.81)
RDW: 16.8 % — ABNORMAL HIGH (ref 11.5–15.5)
WBC: 7.7 10*3/uL (ref 4.0–10.5)

## 2017-03-26 LAB — RENAL FUNCTION PANEL
ALBUMIN: 2.6 g/dL — AB (ref 3.5–5.0)
Anion gap: 8 (ref 5–15)
BUN: 29 mg/dL — ABNORMAL HIGH (ref 6–20)
CALCIUM: 9.4 mg/dL (ref 8.9–10.3)
CO2: 30 mmol/L (ref 22–32)
Chloride: 97 mmol/L — ABNORMAL LOW (ref 101–111)
Creatinine, Ser: 7.85 mg/dL — ABNORMAL HIGH (ref 0.61–1.24)
GFR, EST AFRICAN AMERICAN: 7 mL/min — AB (ref >60)
GFR, EST NON AFRICAN AMERICAN: 6 mL/min — AB (ref >60)
Glucose, Bld: 168 mg/dL — ABNORMAL HIGH (ref 65–99)
Phosphorus: 4.5 mg/dL (ref 2.5–4.6)
Potassium: 3.9 mmol/L (ref 3.5–5.1)
SODIUM: 135 mmol/L (ref 135–145)

## 2017-03-26 LAB — GLUCOSE, CAPILLARY
GLUCOSE-CAPILLARY: 164 mg/dL — AB (ref 65–99)
GLUCOSE-CAPILLARY: 169 mg/dL — AB (ref 65–99)
GLUCOSE-CAPILLARY: 148 mg/dL — AB (ref 65–99)
Glucose-Capillary: 146 mg/dL — ABNORMAL HIGH (ref 65–99)

## 2017-03-26 LAB — CULTURE, BLOOD (ROUTINE X 2): CULTURE: NO GROWTH

## 2017-03-26 MED ORDER — CARVEDILOL 25 MG PO TABS
25.0000 mg | ORAL_TABLET | Freq: Two times a day (BID) | ORAL | Status: DC
  Administered 2017-03-26 – 2017-03-27 (×3): 25 mg via ORAL
  Filled 2017-03-26 (×6): qty 1

## 2017-03-26 NOTE — Progress Notes (Signed)
S: No new CO  HD went well yest O:BP (!) 176/65 (BP Location: Right Wrist)   Pulse (!) 58   Temp 98.2 F (36.8 C)   Resp 18   Ht 5\' 11"  (1.803 m)   Wt 121.1 kg (266 lb 15.6 oz)   SpO2 100%   BMI 37.24 kg/m   Intake/Output Summary (Last 24 hours) at 03/26/17 0828 Last data filed at 03/26/17 0500  Gross per 24 hour  Intake               50 ml  Output             3000 ml  Net            -2950 ml   Weight change: 1.03 kg (2 lb 4.3 oz) DPO:EUMPN and alert CVS:RRR Resp:Clear Abd:+ BS NDNT Ext: Chronic venous stasis changes with mild edema.  Lt AVF + bruit NEURO:CNI Ox3 no asterixis   . aspirin EC  81 mg Oral q morning - 10a  . calcitRIOL  1.75 mcg Oral Q T,Th,Sa-HD  . calcium acetate  2,001 mg Oral TID WC  . darbepoetin (ARANESP) injection - DIALYSIS  60 mcg Intravenous Q Thu-HD  . dextromethorphan-guaiFENesin  1 tablet Oral BID  . feeding supplement (NEPRO CARB STEADY)  237 mL Oral BID BM  . feeding supplement (PRO-STAT SUGAR FREE 64)  30 mL Oral Q1500  . furosemide  80 mg Oral BID  . heparin  5,000 Units Subcutaneous Q8H  . multivitamin  1 tablet Oral QHS  . sodium chloride flush  3 mL Intravenous Q12H  . urea   Topical BID  . vancomycin  1,000 mg Intravenous Q T,Th,Sa-HD   Dg Orthopantogram  Result Date: 03/25/2017 CLINICAL DATA:  Bacteremia EXAM: ORTHOPANTOGRAM/PANORAMIC COMPARISON:  None. FINDINGS: A Panorex study was provided. Near the edentulous mandibular ridges are seen bilaterally. Missing right lower central canine. Super erupted right-sided upper molar teeth with caries noted. Remaining root tips are noted just anterior to this on the right. These may harbor areas of infection/ sites of bacteremia. Numerous missing teeth in the left upper maxilla as well without significant carie formation. IMPRESSION: Caries within right-sided supererupted maxillary molar teeth with 1 and possibly 2 remaining root tips in the region of the premolars on the right. These are potential  sites of bacteremia. Electronically Signed   By: Ashley Royalty M.D.   On: 03/25/2017 01:58   Ct Soft Tissue Neck W Contrast  Result Date: 03/24/2017 CLINICAL DATA:  65 y/o M; bacteremia, neck pain, and knot on the neck. EXAM: CT NECK WITH CONTRAST TECHNIQUE: Multidetector CT imaging of the neck was performed using the standard protocol following the bolus administration of intravenous contrast. CONTRAST:  44mL ISOVUE-300 IOPAMIDOL (ISOVUE-300) INJECTION 61% COMPARISON:  None. FINDINGS: Pharynx and larynx: Normal. No mass or swelling. Salivary glands: No inflammation, mass, or stone. Thyroid: Normal. Lymph nodes: Left supraclavicular lymphadenopathy measuring 15 x 15 x 18 mm (series 3, image 77). Right pericardial masses, probably lymphadenopathy is measuring up to 21 x 16 mm (series 3, image 128). Vascular: Moderate calcific atherosclerosis of the aortic arch. Calcified plaque of the bilateral carotid bifurcations without high-grade stenosis. Dense calcification of the intracranial vertebral artery is with probable high-grade underlying stenosis. Dense calcification of bilateral cavernous internal carotid arteries, partially visualized. Limited intracranial: Negative. Visualized orbits: Visualized portions are negative. Mastoids and visualized paranasal sinuses: Moderate left and mild right maxillary sinus mucosal thickening. Skeleton: Advanced cervical degenerative changes with  endplate eburnation at the C5-6 and C6-7 levels and at multiple upper cervical right-sided facet joints probably representing renal spondyloarthropathy. Upper chest: Negative. Other: None. IMPRESSION: 1. No discrete mass or collection of the neck identified. 2. Left supraclavicular and right pericardial lymphadenopathy. This may be due to systemic infection, lymphoproliferative disease, or possibly metastatic disease of uncertain primary. 3. Advanced cervical spine degenerative changes with finding of renal spondyloarthropathy.  Electronically Signed   By: Kristine Garbe M.D.   On: 03/24/2017 19:36   US Abdomen Limited Ruq  Result Date: 03/24/2017 CLINICAL DATA:  Right upper quadrant pain since yesterday. EXAM: US ABDOMEN LIMITED - RIGHT UPPER QUADRANT COMPARISON:  Renal ultrasound 02/08/2014.  CT 06/04/2009. FINDINGS: Gallbladder: Gallstones of up to 9 mm. No pericholecystic fluid or wall thickening. Sonographic Murphy's sign was not elicited. Common bile duct: Diameter: Normal, 6 mm. Liver: No focal lesion identified. Within normal limits in parenchymal echogenicity. Mildly limited exam secondary to patient body habitus. IMPRESSION: Cholelithiasis without acute cholecystitis or biliary duct dilatation. Mildly limited exam secondary to patient body habitus. Electronically Signed   By: Abigail Miyamoto M.D.   On: 03/24/2017 16:57   BMET    Component Value Date/Time   NA 135 03/26/2017 0526   NA 138 03/03/2017 1635   NA 142 07/03/2014 0814   K 3.9 03/26/2017 0526   K 4.4 07/03/2014 0814   CL 97 (L) 03/26/2017 0526   CO2 30 03/26/2017 0526   CO2 20 (L) 07/03/2014 0814   GLUCOSE 168 (H) 03/26/2017 0526   GLUCOSE 136 07/03/2014 0814   BUN 29 (H) 03/26/2017 0526   BUN 36 (H) 03/03/2017 1635   BUN 93.6 (H) 07/03/2014 0814   CREATININE 7.85 (H) 03/26/2017 0526   CREATININE 7.3 (HH) 07/03/2014 0814   CALCIUM 9.4 03/26/2017 0526   CALCIUM 8.6 07/03/2014 0814   GFRNONAA 6 (L) 03/26/2017 0526   GFRNONAA 31 (L) 08/16/2012 1517   GFRAA 7 (L) 03/26/2017 0526   GFRAA 36 (L) 08/16/2012 1517   CBC    Component Value Date/Time   WBC 7.7 03/26/2017 0526   RBC 3.12 (L) 03/26/2017 0526   HGB 9.4 (L) 03/26/2017 0526   HGB 9.4 (L) 07/03/2014 0812   HCT 29.6 (L) 03/26/2017 0526   HCT 26.6 (L) 03/22/2017 1207   HCT 29.0 (L) 07/03/2014 0812   PLT 94 (L) 03/26/2017 0526   PLT 170 03/03/2017 1635   MCV 94.9 03/26/2017 0526   MCV 91 03/03/2017 1635   MCV 86.1 07/03/2014 0812   MCH 30.1 03/26/2017 0526   MCHC 31.8  03/26/2017 0526   RDW 16.8 (H) 03/26/2017 0526   RDW 16.9 (H) 03/03/2017 1635   RDW 16.3 (H) 07/03/2014 0812   LYMPHSABS 1.6 03/24/2017 0446   LYMPHSABS 1.5 07/03/2014 0812   MONOABS 0.9 03/24/2017 0446   MONOABS 0.7 07/03/2014 0812   EOSABS 0.5 03/24/2017 0446   EOSABS 0.6 (H) 07/03/2014 0812   BASOSABS 0.0 03/24/2017 0446   BASOSABS 0.1 07/03/2014 0812     Assessment: 1.  Rothia bacteremia presumably of mouth origin    FU BC neg to date 2. ESRD  TTS 3. HTN 4. Monoclonal gammopathy.  Seen by Heme with plans for continued observation 5. Anemia on aranesp 6. Thrombocytopenia 7. Sec HPTH on calcitriol  Plan: 1.  HD Tuesday if still here 2. BP high.  Says he was on coreg 50mg  BID, will resume at 25mg  BID and watch pulse as it is lowish to begin with  3. Says he is to have TEE tomorrow  [place] Christine T

## 2017-03-26 NOTE — Progress Notes (Signed)
Pharmacy Antibiotic Note  [name] is a 66 y.o. male admitted on 03/21/2017 with sepsis.  Pharmacy has been consulted for Vancomycin and Zosyn dosing.  Pt has ESRD and receives HD TTS. Cultures growing rothia spp in 1/2 cultures. Patient has been tolerating HD sessions well. Last session was 4/14 for 4 hours at full rate. Estimated vancomycin level by calculation is 20. They are currently afebrile with normal white count. TEE pending for Monday.   Plan: Vancomycin 1gm IV after each HD (TTS) Follow-up HD schedule Consider Vanc level pre-HD 4/17 with continued therapy Follow-up TEE results for duration of therapy  Height: 5\' 11"  (180.3 cm) Weight: 266 lb 15.6 oz (121.1 kg) IBW/kg (Calculated) : 75.3  Temp (24hrs), Avg:98 F (36.7 C), Min:97.7 F (36.5 C), Max:98.2 F (36.8 C)   Recent Labs Lab 03/21/17 1723 03/21/17 2156 03/22/17 0025 03/22/17 0640 03/22/17 1207 03/23/17 0455 03/24/17 0446 03/25/17 0736 03/26/17 0526  WBC  --   --  3.5*  --   --  7.7 8.7 8.8 7.7  CREATININE  --   --  11.44*  --   --  12.85* 8.10* 10.42* 7.85*  LATICACIDVEN 3.35* 2.8* 2.4* 1.3 1.5  --   --   --   --     Estimated Creatinine Clearance: 12.1 mL/min (A) (by C-G formula based on SCr of 7.85 mg/dL (H)).    Allergies  Allergen Reactions  . Shrimp [Shellfish Allergy] Swelling  . Heparin     Due to current thrombocytopenia    4/10 Vanc 4/10 Zosyn >> 4/13   Vanc Dose Adjustments: 2gm LD given 4/10 --> Vanc peak 18.34 --> HD 4/12 for full session --> Est level 10 --> 1gm given --> 19 --> HD 4/14 full --> 10.7 --> 1gm given --> 20  4/11 MRSA PCR negative 4/10 blood x 2 >> 1/2 GPC pairs and chains =Rothia species 4/10 BCID call >> cultures: 1 of 2 blood cultures showing GPC in chains and pairs. BCID shows no organisms 4/11 UCx : insign growth/neg 4/11 BCx x2:  ngtd   Thank you for allowing pharmacy to be a part of this patient's care.  Dierdre Harness, Cain Sieve, PharmD Clinical Pharmacy  Resident [phone] (Pager) 03/26/2017 2:40 PM

## 2017-03-26 NOTE — Progress Notes (Signed)
Family Medicine Teaching Service Daily Progress Note Intern Pager: [phone]  Patient name: [name] Medical record number: [id number] Date of birth: [birth year] Age: 66 y.o. Gender: male  Primary Care Provider: Georges Lynch, MD Consultants: Nephro, Cardiology Code Status: Full  Pt Overview and Major Events to Date:  4/10- Admited to [place] 4/11- Rothia grew in 1/2 blood cultures  Assessment and Plan: [name] a 66 y.o. is here for sepsis, presenting with fever and weakness and found to have Rothia in 1/2 blood samples. Questionable diagnosis of new Afib on ED EKG, however has been NSR on telemetry. PMH is significant for diastolic CHF, ESRD on Dialysis, MGUS, DM, HTN, HLD, Asthma, morbid obesity, lymphedema, and venous insufficiency  Bacteremia:Vitals remain stable and pt afebrile.  Blood Cx 4/10: GPC in pairs and chains on gram stain, with rothia species in 1/2 bottles. Not thought to be a contaminant by ID. Blood cx 4/11: NG x 3 days. Suspected dental source. Urine culture with insignificant growth. CT neck without mass or fluid collection but left supraclavicular and right pericardial lymphadenopathy present. Orthopantogram with caries within right-sided supererupted maxillary molar teeth with 1-2 remaining root tips in the region of the premolars on the right as possible infectious source. TTE did not show evidence of vegetations.  - ID consulted, appreciate recommendations: Continue IV vancomycin to cover for Rothia until obtain TEE. If negative for vegetations, would need to complete 2 week course of vancomycin instead of 6 week extended course.  - Cardiology to follow up plts on Monday and anticipate doing TEE - Zosyn 4/10-4/14 - Vancomycin every other day since 4/10 >> - follow blood cultures   Gross Hematuria, resolved: After I/O cath in the ED. Likely traumatic.  - Restart home aspirin  Sinus Bradycardia: Asymptomatic  - Restart coreg 25 mg BID (half home dose --  pt reports taking 50 BID but 25 BID listed on MAR)  RUQ Abdominal Pain, resolved: started while in HD on day of admission. Not associated with food. No history of similar symptoms. Unclear in etiology.  - RUQ [place] --> Cholelithiasis without acute cholecystitis or biliary duct dilatation. - Hepatic function panel WNL - Hepatitis C ab and Hep B Ag negative  New Afib, resolved: noted in EKG obtained in ED. Now NSR. CHADSVASC 4. Per cards, no anticoagulation in the setting of thrombocytopenia. Also would not start until after TEE. TSH normal. Afib noted on telemetry around 1900 4/14 but p waves present and likely missed beats. - cards consult, appreciate recommendations: will ask if patient needs to be on anticoag in the future or if this episode is due to sepsis   - On SQ heparin for VTE prophylaxis as no longer in severe range thrombocytopenia  Elevated troponin: 0.45 >0.46 >0.42 > 0.34. Flat, likely in the setting of ESRD with new onset afib. Unlikely ACS per cardiology - Restart ASA 81 mg  - ECHO with LVEF 55-60%, mild LVH, G2DD  Thrombocytopenia/Anemia:  Likely reactive in the setting of acute infection/stress. Hgb is stable/improving. Plt improving. HIT panel negative. Peripheral smear unremarkable.  - heme-onc consult: likely reactive due to acute illness. SPEP is unchanged from prior. If his platelet counts continues to rise in the next 24-48 hours, no further workup is needed at this time. Will arrange follow up.  ESRD on HD:  - nephrology consulted: HD Tuesday if still hospitalized, otherwise okay to discharge  Asthma: no sign of exacerbation - PRN duonebs  DM2:HgbA1c most recently 6.8 in 03/13/2017.  Diet controlled. Glucose stable on BMP - monitor glucose on BMP  HTN - Elevated at 176/65 a.m. Had been holding home regimen due to bradycardia and normotensive on admission. Takes norvasc 10 mg QD, Coreg 25 mg QD, Lasix at home - Will restart carvedilol at half of patient's reported  dose   Hx of CHF: Echo on 06/25/2015 showing no signs of CHF with EF of55%. Trace LE to mid-shin on exam 4/15. - Continue Lasix - Restart coreg  HLD - Last lipid panel 03/13/2017 - Cholesterol 184, HDL 29, LDL 115, Triglyceride 201 - statin recommended by PCP at last outpatient visit - patient had adamantly refused statin - can continue to counsel  Morbid Obesity - weight loss counseling  Lymphedema: Chronic significant skin changes. - urea lotion   MGUS with smoldering multiple myeloma: Last seen by oncology in 06/2014 and patient was lost to follow up per chart review. SPEP unchanged from prior. Seen by oncology during hospitalization   Hypoalbuminemia: Albumin 2.7 - Will order nutrition consult  FEN/GI: Renal diet, SLIV PPx: SQ heparin  Disposition: pending TEE.   Subjective:  No complaints this am. Only hint of RUQ pain remains. Reports he is walking around his room a lot and feels well. Denies palpitations or chest pain.  Objective: Temp:  [97 F (36.1 C)-98.4 F (36.9 C)] 98.2 F (36.8 C) (04/14 2152) Pulse Rate:  [50-78] 58 (04/15 0545) Resp:  [14-20] 18 (04/15 0545) BP: (123-199)/(50-95) 176/65 (04/15 0545) SpO2:  [98 %-100 %] 100 % (04/15 0545) Weight:  [266 lb 12.1 oz (121 kg)-266 lb 15.6 oz (121.1 kg)] 266 lb 15.6 oz (121.1 kg) (04/15 0500) Physical Exam: General: Well appearing, NAD, lying in bed Cardiovascular: RRR, no MRG Respiratory: CTAB. No increased work of breathing.   Abdomen: Soft, +BS, NT, ND Extremities: BL 5th toe amputations. Trace BLE pitting edema. R leg with increased width compared to left (chronic after R posterior tibial bypass per patient). HD fistula at left wrist.   Laboratory:  Recent Labs Lab 03/24/17 0446 03/25/17 0736 03/26/17 0526  WBC 8.7 8.8 7.7  HGB 10.5* 9.1* 9.4*  HCT 31.6* 28.5* 29.6*  PLT 31* 66* 94*    Recent Labs Lab 03/21/17 1450  03/24/17 0446 03/25/17 0736 03/26/17 0526  NA 138  < > 133* 132* 135   K 4.1  < > 4.3 4.2 3.9  CL 99*  < > 95* 96* 97*  CO2 26  < > '29 26 30  ' BUN 37*  < > 33* 43* 29*  CREATININE 10.82*  < > 8.10* 10.42* 7.85*  CALCIUM 8.5*  < > 9.1 9.3 9.4  PROT 7.0  --  7.2  --   --   BILITOT 1.1  --  0.7  --   --   ALKPHOS 57  --  56  --   --   ALT 15*  --  18  --   --   AST 29  --  22  --   --   GLUCOSE 123*  < > 106* 115* 168*  < > = values in this interval not displayed. TSH: 3.436 MRSA by PCR: Negative    Blood Cx 4/10: GPC in pairs and chains on gram stain 1/2 bottles Blood cx 4/11: NG x 2 days  Urine cx: insignificant growth   Imaging/Diagnostic Tests: Dg Orthopantogram  Result Date: 03/25/2017 CLINICAL DATA:  Bacteremia EXAM: ORTHOPANTOGRAM/PANORAMIC COMPARISON:  None. FINDINGS: A Panorex study was provided. Near the edentulous mandibular  ridges are seen bilaterally. Missing right lower central canine. Super erupted right-sided upper molar teeth with caries noted. Remaining root tips are noted just anterior to this on the right. These may harbor areas of infection/ sites of bacteremia. Numerous missing teeth in the left upper maxilla as well without significant carie formation. IMPRESSION: Caries within right-sided supererupted maxillary molar teeth with 1 and possibly 2 remaining root tips in the region of the premolars on the right. These are potential sites of bacteremia. Electronically Signed   By: Ashley Royalty M.D.   On: 03/25/2017 01:58   Ct Soft Tissue Neck W Contrast  Result Date: 03/24/2017 CLINICAL DATA:  66 y/o M; bacteremia, neck pain, and knot on the neck. EXAM: CT NECK WITH CONTRAST TECHNIQUE: Multidetector CT imaging of the neck was performed using the standard protocol following the bolus administration of intravenous contrast. CONTRAST:  3m ISOVUE-300 IOPAMIDOL (ISOVUE-300) INJECTION 61% COMPARISON:  None. FINDINGS: Pharynx and larynx: Normal. No mass or swelling. Salivary glands: No inflammation, mass, or stone. Thyroid: Normal. Lymph nodes:  Left supraclavicular lymphadenopathy measuring 15 x 15 x 18 mm (series 3, image 77). Right pericardial masses, probably lymphadenopathy is measuring up to 21 x 16 mm (series 3, image 128). Vascular: Moderate calcific atherosclerosis of the aortic arch. Calcified plaque of the bilateral carotid bifurcations without high-grade stenosis. Dense calcification of the intracranial vertebral artery is with probable high-grade underlying stenosis. Dense calcification of bilateral cavernous internal carotid arteries, partially visualized. Limited intracranial: Negative. Visualized orbits: Visualized portions are negative. Mastoids and visualized paranasal sinuses: Moderate left and mild right maxillary sinus mucosal thickening. Skeleton: Advanced cervical degenerative changes with endplate eburnation at the C5-6 and C6-7 levels and at multiple upper cervical right-sided facet joints probably representing renal spondyloarthropathy. Upper chest: Negative. Other: None. IMPRESSION: 1. No discrete mass or collection of the neck identified. 2. Left supraclavicular and right pericardial lymphadenopathy. This may be due to systemic infection, lymphoproliferative disease, or possibly metastatic disease of uncertain primary. 3. Advanced cervical spine degenerative changes with finding of renal spondyloarthropathy. Electronically Signed   By: LKristine GarbeM.D.   On: 03/24/2017 19:36   [place]dom[place] Limited Ruq  Result Date: 03/24/2017 CLINICAL DATA:  Right upper quadrant pain since yesterday. EXAM: [place]DOM[place] LIMITED - RIGHT UPPER QUADRANT COMPARISON:  Renal ultrasound 02/08/2014.  CT 06/04/2009. FINDINGS: Gallbladder: Gallstones of up to 9 mm. No pericholecystic fluid or wall thickening. Sonographic Murphy's sign was not elicited. Common bile duct: Diameter: Normal, 6 mm. Liver: No focal lesion identified. Within normal limits in parenchymal echogenicity. Mildly limited exam secondary to patient body habitus. IMPRESSION:  Cholelithiasis without acute cholecystitis or biliary duct dilatation. Mildly limited exam secondary to patient body habitus. Electronically Signed   By: KAbigail MiyamotoM.D.   On: 03/24/2017 16:57    Sabrea Sankey MCorinda Gubler MD  PGY-2, [facility] 03/26/2017, 7:22 AM

## 2017-03-26 NOTE — Care Management Note (Signed)
Case Management Note  Patient Details  Name: [name] MRN: [id number] Date of Birth: February 20, 1951  Subjective/Objective:       Admitted with Sepsis, hx of CHF, ESRD/ Dialysis (TTHSat) , DM, HTN, HLD, Asthma, morbid obesity, lymphedema, and venous insufficiency.  Action/Plan: Plan : TEE 03/26/2017...Marland KitchenCM to continue  following for disposition needs.  Expected Discharge Date:                  Expected Discharge Plan:  Home/Self Care  In-House Referral:     Discharge planning Services  CM Consult  Post Acute Care Choice:    Choice offered to:     DME Arranged:    DME Agency:     HH Arranged:    HH Agency:     Status of Service:  In process, will continue to follow  If discussed at Long Length of Stay Meetings, dates discussed:    Additional Comments:  Sharin Mons, RN 03/26/2017, 2:57 PM

## 2017-03-26 NOTE — Progress Notes (Signed)
    [facility] has been requested to perform a transesophageal echocardiogram on [name] for SBE.  After careful review of history and examination, the risks and benefits of transesophageal echocardiogram have been explained including risks of esophageal damage, perforation (1:10,000 risk), bleeding, pharyngeal hematoma as well as other potential complications associated with conscious sedation including aspiration, arrhythmia, respiratory failure and death. Alternatives to treatment were discussed, questions were answered. Patient is willing to proceed.   Murray Hodgkins, NP  03/26/2017 3:32 PM

## 2017-03-27 ENCOUNTER — Inpatient Hospital Stay (HOSPITAL_COMMUNITY): Payer: Medicare Other | Admitting: Anesthesiology

## 2017-03-27 ENCOUNTER — Inpatient Hospital Stay (HOSPITAL_COMMUNITY): Payer: Medicare Other

## 2017-03-27 ENCOUNTER — Encounter (HOSPITAL_COMMUNITY)
Admission: EM | Disposition: A | Discharge status: Home Health | Payer: Self-pay | Source: Home / Self Care | Attending: Family Medicine

## 2017-03-27 ENCOUNTER — Encounter (HOSPITAL_COMMUNITY): Payer: Self-pay

## 2017-03-27 DIAGNOSIS — K045 Chronic apical periodontitis: Secondary | ICD-10-CM

## 2017-03-27 DIAGNOSIS — R7881 Bacteremia: Secondary | ICD-10-CM

## 2017-03-27 DIAGNOSIS — K029 Dental caries, unspecified: Secondary | ICD-10-CM

## 2017-03-27 DIAGNOSIS — R7989 Other specified abnormal findings of blood chemistry: Secondary | ICD-10-CM

## 2017-03-27 LAB — GLUCOSE, CAPILLARY
GLUCOSE-CAPILLARY: 127 mg/dL — AB (ref 65–99)
GLUCOSE-CAPILLARY: 109 mg/dL — AB (ref 65–99)
GLUCOSE-CAPILLARY: 171 mg/dL — AB (ref 65–99)
GLUCOSE-CAPILLARY: 129 mg/dL — AB (ref 65–99)

## 2017-03-27 LAB — CBC
HCT: 28.8 % — ABNORMAL LOW (ref 39.0–52.0)
Hemoglobin: 9.1 g/dL — ABNORMAL LOW (ref 13.0–17.0)
MCH: 29.8 pg (ref 26.0–34.0)
MCHC: 31.6 g/dL (ref 30.0–36.0)
MCV: 94.4 fL (ref 78.0–100.0)
PLATELETS: 137 10*3/uL — AB (ref 150–400)
RBC: 3.05 MIL/uL — AB (ref 4.22–5.81)
RDW: 16.9 % — ABNORMAL HIGH (ref 11.5–15.5)
WBC: 7.6 10*3/uL (ref 4.0–10.5)

## 2017-03-27 LAB — UIFE/LIGHT CHAINS/TP QN, 24-HR UR
% BETA, URINE: 32.1 %
ALBUMIN, U: 16.1 %
ALPHA 1 URINE: 8.5 %
ALPHA 2 UR: 21.1 %
FREE KAPPA/LAMBDA RATIO: 5.74 (ref 2.04–10.37)
FREE LAMBDA LT CHAINS, UR: 97.8 mg/L — AB (ref 0.24–6.66)
Free Lt Chn Excr Rate: 561 mg/L — ABNORMAL HIGH (ref 1.35–24.19)
GAMMA GLOBULIN URINE: 22.2 %
M-SPIKE %, Urine: 6.5 % — ABNORMAL HIGH
M-SPIKE, MG/24 HR: 25 mg/(24.h) — AB
TOTAL PROTEIN, URINE-UR/DAY: 389 mg/(24.h) — AB (ref 30–150)
Time: 24 hours
Total Protein, Urine: 91.5 mg/dL
Total Volume: 425

## 2017-03-27 LAB — CULTURE, BLOOD (ROUTINE X 2)
CULTURE: NO GROWTH
CULTURE: NO GROWTH
SPECIAL REQUESTS: ADEQUATE
Special Requests: ADEQUATE

## 2017-03-27 LAB — RENAL FUNCTION PANEL
Albumin: 2.8 g/dL — ABNORMAL LOW (ref 3.5–5.0)
Anion gap: 11 (ref 5–15)
BUN: 41 mg/dL — AB (ref 6–20)
CO2: 28 mmol/L (ref 22–32)
Calcium: 9.7 mg/dL (ref 8.9–10.3)
Chloride: 95 mmol/L — ABNORMAL LOW (ref 101–111)
Creatinine, Ser: 9.8 mg/dL — ABNORMAL HIGH (ref 0.61–1.24)
GFR calc Af Amer: 6 mL/min — ABNORMAL LOW (ref >60)
GFR calc non Af Amer: 5 mL/min — ABNORMAL LOW (ref >60)
GLUCOSE: 142 mg/dL — AB (ref 65–99)
POTASSIUM: 3.8 mmol/L (ref 3.5–5.1)
Phosphorus: 5.5 mg/dL — ABNORMAL HIGH (ref 2.5–4.6)
Sodium: 134 mmol/L — ABNORMAL LOW (ref 135–145)

## 2017-03-27 SURGERY — INVASIVE LAB ABORTED CASE
Anesthesia: Monitor Anesthesia Care

## 2017-03-27 MED ORDER — PROPOFOL 10 MG/ML IV BOLUS
INTRAVENOUS | Status: DC | PRN
  Administered 2017-03-27: 20 mg via INTRAVENOUS
  Administered 2017-03-27: 50 mg via INTRAVENOUS
  Administered 2017-03-27: 20 mg via INTRAVENOUS
  Administered 2017-03-27: 30 mg via INTRAVENOUS
  Administered 2017-03-27: 50 mg via INTRAVENOUS
  Administered 2017-03-27: 30 mg via INTRAVENOUS

## 2017-03-27 MED ORDER — LIDOCAINE HCL (PF) 1 % IJ SOLN
5.0000 mL | INTRAMUSCULAR | Status: DC | PRN
  Filled 2017-03-27: qty 5

## 2017-03-27 MED ORDER — LIDOCAINE-PRILOCAINE 2.5-2.5 % EX CREA
1.0000 "application " | TOPICAL_CREAM | CUTANEOUS | Status: DC | PRN
  Filled 2017-03-27: qty 5

## 2017-03-27 MED ORDER — LIDOCAINE HCL (CARDIAC) 20 MG/ML IV SOLN
INTRAVENOUS | Status: DC | PRN
  Administered 2017-03-27: 50 mg via INTRATRACHEAL

## 2017-03-27 MED ORDER — SODIUM CHLORIDE 0.9 % IV SOLN
INTRAVENOUS | Status: DC | PRN
  Administered 2017-03-27: 09:00:00 via INTRAVENOUS

## 2017-03-27 MED ORDER — ALTEPLASE 2 MG IJ SOLR: 2.0000 mg | Freq: Once | INTRAMUSCULAR | Status: DC | PRN

## 2017-03-27 MED ORDER — SODIUM CHLORIDE 0.9 % IV SOLN: 100.0000 mL | INTRAVENOUS | Status: DC | PRN

## 2017-03-27 MED ORDER — PENTAFLUOROPROP-TETRAFLUOROETH EX AERO: 1.0000 "application " | INHALATION_SPRAY | CUTANEOUS | Status: DC | PRN

## 2017-03-27 MED ORDER — BUTAMBEN-TETRACAINE-BENZOCAINE 2-2-14 % EX AERO
INHALATION_SPRAY | CUTANEOUS | Status: DC | PRN
  Administered 2017-03-27: 1 via TOPICAL

## 2017-03-27 MED ORDER — HEPARIN SODIUM (PORCINE) 1000 UNIT/ML DIALYSIS
1000.0000 [IU] | INTRAMUSCULAR | Status: DC | PRN
  Filled 2017-03-27: qty 1

## 2017-03-27 MED ORDER — HYDRALAZINE HCL 20 MG/ML IJ SOLN
INTRAMUSCULAR | Status: AC
  Filled 2017-03-27: qty 1

## 2017-03-27 MED ORDER — HYDRALAZINE HCL 20 MG/ML IJ SOLN
INTRAMUSCULAR | Status: DC | PRN
  Administered 2017-03-27: 5 mg via INTRAVENOUS

## 2017-03-27 MED ORDER — ONDANSETRON HCL 4 MG/2ML IJ SOLN
INTRAMUSCULAR | Status: DC | PRN
  Administered 2017-03-27: 4 mg via INTRAVENOUS

## 2017-03-27 MED ORDER — HEPARIN SODIUM (PORCINE) 1000 UNIT/ML DIALYSIS
6000.0000 [IU] | Freq: Once | INTRAMUSCULAR | Status: AC
  Administered 2017-03-28: 6000 [IU] via INTRAVENOUS_CENTRAL
  Filled 2017-03-27: qty 6

## 2017-03-27 NOTE — Anesthesia Procedure Notes (Signed)
Procedure Name: MAC Date/Time: 03/27/2017 9:31 AM Performed by: Jacquiline Doe A Pre-anesthesia Checklist: Patient identified, Emergency Drugs available, Suction available and Patient being monitored Patient Re-evaluated:Patient Re-evaluated prior to inductionOxygen Delivery Method: Nasal cannula Intubation Type: IV induction Placement Confirmation: positive ETCO2 Dental Injury: Teeth and Oropharynx as per pre-operative assessment

## 2017-03-27 NOTE — Consult Note (Signed)
Labs in the last 2 days reviewed which showed complete recovery of his platelet count. His white cell count has been normal and hemoglobin is back to baseline.  His thrombocytopenia was reactive as anticipated with recovery to baseline after his febrile illness have been treated. He has no signs or symptoms of any hematological disorder or evolving multiple myeloma. He has MGUS which has not changed over the last 10 years.  No further hematology workup is needed. He will likely need a repeat serum protein electrophoresis in 2019. Please call with any questions regarding this pleasant gentleman.

## 2017-03-27 NOTE — Interval H&P Note (Signed)
History and Physical Interval Note:  03/27/2017 8:39 AM  [name]  has presented today for surgery, with the diagnosis of endocarditis  The various methods of treatment have been discussed with the patient and family. After consideration of risks, benefits and other options for treatment, the patient has consented to  Procedure(s): TRANSESOPHAGEAL ECHOCARDIOGRAM (TEE) (N/A) as a surgical intervention .  The patient's history has been reviewed, patient examined, no change in status, stable for surgery.  I have reviewed the patient's chart and labs.  Questions were answered to the patient's satisfaction.     Fransico Him

## 2017-03-27 NOTE — CV Procedure (Signed)
    PROCEDURE NOTE:  Procedure:  Transesophageal echocardiogram Operator:  Fransico Him, MD Indications:  Bactermia Complications: None  During this procedure the patient is administered a total of Diprovan 200mg  to achieve and maintain  sedation.  The patient's heart rate, blood pressure, and oxygen saturation are monitored continuously during the procedure by anesthesia.  Results: Multiple attempts were made at placing the probe without success.  The patient has very poor dentition with very large tongue.  He developed some bleeding from his gums with a loose tooth and after discussion with anesthesia the procedure was aborted.  The patient was transferred back to room in stable condition.  Signed: Fransico Him, MD [facility]

## 2017-03-27 NOTE — Transfer of Care (Signed)
Immediate Anesthesia Transfer of Care Note  Patient: [name]  Procedure(s) Performed: Procedure(s): TRANSESOPHAGEAL ECHOCARDIOGRAM (TEE) (N/A)  Patient Location: Endoscopy Unit  Anesthesia Type:MAC  Level of Consciousness: awake, oriented, sedated, patient cooperative and responds to stimulation  Airway & Oxygen Therapy: Patient Spontanous Breathing and Patient connected to nasal cannula oxygen  Post-op Assessment: Report given to RN, Post -op Vital signs reviewed and stable, Patient moving all extremities and Patient moving all extremities X 4  Post vital signs: Reviewed and stable  Last Vitals:  Vitals:   03/27/17 0833 03/27/17 1004  BP: (!) 239/85   Pulse: (!) 54   Resp: 12   Temp: 36.7 C 36.6 C    Last Pain:  Vitals:   03/27/17 1004  TempSrc: Oral  PainSc:       Patients Stated Pain Goal: 0 (09/81/19 1478)  Complications: No apparent anesthesia complications

## 2017-03-27 NOTE — Progress Notes (Signed)
S: No new CO  . Couldn't get TEE today due to difficulty getting probe into his esophagus  O:BP (!) 187/80   Pulse (!) 53   Temp 97.9 F (36.6 C) (Oral)   Resp 14   Ht 5\' 11"  (1.803 m)   Wt 122.9 kg (271 lb)   SpO2 100%   BMI 37.80 kg/m   Intake/Output Summary (Last 24 hours) at 03/27/17 1359 Last data filed at 03/27/17 1000  Gross per 24 hour  Intake              250 ml  Output              251 ml  Net               -1 ml   Weight change: 0 kg (0 lb) ZOX:WRUEA and alert CVS:RRR Resp:Clear Abd:+ BS NDNT Ext: Chronic venous stasis changes with mild edema.  Lt AVF + bruit NEURO:CNI Ox3 no asterixis  Dialysis: [facility] 4h  116kg  3K/2.25 bath  P4  LFA AVF   Hep 6000 - Mircera 75 ug every 2 wks last 4/5 - Calcitriol 0.75 ug tiw - last Hb 10 , 38% sat, pth 622 P ^^   Assessment: 1. [place] bacteremia - presumably of mouth origin. No access infection. FU BC neg to date. Did not tolerate TEE attempt today.  2. ESRD  TTS HD 3. HTN - restarted home Coreg 4. Monoclonal gammopathy.  Seen by Heme, signed off, no new w/u needed 5. Anemia on aranesp 6. Thrombocytopenia 7. Sec HPTH on calcitriol 8. Volume excess - up 6-7kg if wt's are accurate  Plan: 1.  HD Tuesday, max UF   [name] MD [facility] pgr [phone]   03/27/2017, 2:01 PM    . aspirin EC  81 mg Oral q morning - 10a  . calcitRIOL  1.75 mcg Oral Q T,Th,Sa-HD  . calcium acetate  2,001 mg Oral TID WC  . carvedilol  25 mg Oral BID WC  . darbepoetin (ARANESP) injection - DIALYSIS  60 mcg Intravenous Q Thu-HD  . dextromethorphan-guaiFENesin  1 tablet Oral BID  . feeding supplement (NEPRO CARB STEADY)  237 mL Oral BID BM  . feeding supplement (PRO-STAT SUGAR FREE 64)  30 mL Oral Q1500  . furosemide  80 mg Oral BID  . heparin  5,000 Units Subcutaneous Q8H  . multivitamin  1 tablet Oral QHS  . sodium chloride flush  3 mL Intravenous Q12H  . urea   Topical BID  . vancomycin  1,000 mg  Intravenous Q T,Th,Sa-HD   No results found. BMET    Component Value Date/Time   NA 134 (L) 03/27/2017 0502   NA 138 03/03/2017 1635   NA 142 07/03/2014 0814   K 3.8 03/27/2017 0502   K 4.4 07/03/2014 0814   CL 95 (L) 03/27/2017 0502   CO2 28 03/27/2017 0502   CO2 20 (L) 07/03/2014 0814   GLUCOSE 142 (H) 03/27/2017 0502   GLUCOSE 136 07/03/2014 0814   BUN 41 (H) 03/27/2017 0502   BUN 36 (H) 03/03/2017 1635   BUN 93.6 (H) 07/03/2014 0814   CREATININE 9.80 (H) 03/27/2017 0502   CREATININE 7.3 (HH) 07/03/2014 0814   CALCIUM 9.7 03/27/2017 0502   CALCIUM 8.6 07/03/2014 0814   GFRNONAA 5 (L) 03/27/2017 0502   GFRNONAA 31 (L) 08/16/2012 1517   GFRAA 6 (L) 03/27/2017 0502   GFRAA 36 (L) 08/16/2012 1517  CBC    Component Value Date/Time   WBC 7.6 03/27/2017 0502   RBC 3.05 (L) 03/27/2017 0502   HGB 9.1 (L) 03/27/2017 0502   HGB 9.4 (L) 07/03/2014 0812   HCT 28.8 (L) 03/27/2017 0502   HCT 26.6 (L) 03/22/2017 1207   HCT 29.0 (L) 07/03/2014 0812   PLT 137 (L) 03/27/2017 0502   PLT 170 03/03/2017 1635   MCV 94.4 03/27/2017 0502   MCV 91 03/03/2017 1635   MCV 86.1 07/03/2014 0812   MCH 29.8 03/27/2017 0502   MCHC 31.6 03/27/2017 0502   RDW 16.9 (H) 03/27/2017 0502   RDW 16.9 (H) 03/03/2017 1635   RDW 16.3 (H) 07/03/2014 0812   LYMPHSABS 1.6 03/24/2017 0446   LYMPHSABS 1.5 07/03/2014 0812   MONOABS 0.9 03/24/2017 0446   MONOABS 0.7 07/03/2014 0812   EOSABS 0.5 03/24/2017 0446   EOSABS 0.6 (H) 07/03/2014 0812   BASOSABS 0.0 03/24/2017 0446   BASOSABS 0.1 07/03/2014 9485

## 2017-03-27 NOTE — Interval H&P Note (Signed)
History and Physical Interval Note:  03/27/2017 8:47 AM  [name]  has presented today for surgery, with the diagnosis of endocarditis  The various methods of treatment have been discussed with the patient and family. After consideration of risks, benefits and other options for treatment, the patient has consented to  Procedure(s): TRANSESOPHAGEAL ECHOCARDIOGRAM (TEE) (N/A) as a surgical intervention .  The patient's history has been reviewed, patient examined, no change in status, stable for surgery.  I have reviewed the patient's chart and labs.  Questions were answered to the patient's satisfaction.     Fransico Him

## 2017-03-27 NOTE — Anesthesia Postprocedure Evaluation (Addendum)
Anesthesia Post Note  Patient: [name]  Procedure(s) Performed: Procedure(s): Aborted TEE  Patient location during evaluation: Endoscopy Anesthesia Type: MAC Level of consciousness: awake and alert Pain management: pain level controlled Vital Signs Assessment: post-procedure vital signs reviewed and stable Respiratory status: spontaneous breathing, nonlabored ventilation, respiratory function stable and patient connected to nasal cannula oxygen Cardiovascular status: stable and blood pressure returned to baseline Anesthetic complications: no       Last Vitals:  Vitals:   03/27/17 1004 03/27/17 1010  BP: (!) 161/57 (!) 187/80  Pulse: (!) 56 (!) 53  Resp: 15 14  Temp: 36.6 C     Last Pain:  Vitals:   03/27/17 1004  TempSrc: Oral  PainSc:                  Christella App,JAMES TERRILL

## 2017-03-27 NOTE — Consult Note (Signed)
DENTAL CONSULTATION  Date of Consultation:  03/27/2017 Patient Name:   [name] Date of Birth:   [birth year] Medical Record Number: [id number]  VITALS: BP (!) 160/62 (BP Location: Right Arm)   Pulse (!) 45   Temp 98.1 F (36.7 C)   Resp 18   Ht 5\' 11"  (1.803 m)   Wt 271 lb (122.9 kg)   SpO2 100%   BMI 37.80 kg/m   CHIEF COMPLAINT: Patient referred by Dr. Frances Nickels in [facility] Medicine for dental consultation.  HPI: [name] is a 66 year old male with history of sepsis and bacteremia. Dental Consultation requested to evaluate for possible dental etiology for the bacteremia.    The patient currently denies acute toothaches, swellings, or abscesses. Patient was last seen in early 2018 by his primary dentist, Dr. Mellissa Kohut, for a dental extraction. Patient denies having any complications from that dental extraction. The patient does not seek regular dental care. The patient denies having any partial dentures. Patient knows that his teeth are bad. Patient denies having dental phobia. Patient indicates that he dialyzes for end stage renal disease on Tuesday, Thursday, and Saturday.  PROBLEM LIST: Patient Active Problem List   Diagnosis Date Noted  . Bacteremia   . RUQ abdominal pain   . Elevated lactic acid level   . Febrile illness   . Gross hematuria   . Other pancytopenia ([place])   . Sepsis ([place]) 03/21/2017  . Diarrhea 03/03/2017  . Venous stasis ulcers of both lower extremities ([place]) 08/07/2015  . Varicose veins of lower extremities with complications 78/29/5621  . Malfunction of arteriovenous dialysis fistula ([place])   . CHF (congestive heart failure) ([place]) 06/23/2015  . Lymphedema distichiasis syndrome with kidney disease and diabetes mellitus ([place]) 04/06/2015  . Lymphedema of lower extremity 04/04/2015  . DM (diabetes mellitus), type 2 with renal complications ([place]) 30/86/5784  . MGUS (monoclonal gammopathy of unknown significance) 06/12/2014  . Special screening  for malignant neoplasms, colon 07/09/2013  . Proliferative diabetic retinopathy ([place]) 10/23/2012  . Knee pain 08/21/2012  . Mass of thigh 08/21/2012  . ORGANIC IMPOTENCE 12/02/2009  . ONYCHOMYCOSIS 07/17/2009  . ESRD (end stage renal disease) on dialysis ([place]) 06/09/2008  . Hyperlipidemia 03/19/2007  . OBESITY, MORBID 02/08/2007  . HYPERTENSION, BENIGN SYSTEMIC 02/08/2007  . Atherosclerosis of native arteries of extremity with intermittent claudication ([place]) 02/08/2007    PMH: Past Medical History:  Diagnosis Date  . Arthritis   . Asthma    as a child  . CHF (congestive heart failure) ([place])   . Chronic cystitis   . Chronic kidney disease   . Diabetes mellitus   . Dry skin   . H/O hiatal hernia    states it's been fixed  . Hyperlipidemia   . Hypertension   . Lymphedema   . Morbid obesity ([place])   . NEPHROLITHIASIS, HX OF 12/02/2009   Qualifier: Diagnosis of  By: [name], Cat    . PVD (peripheral vascular disease) ([place])   . Renal insufficiency   . UMBILICAL HERNIA 6/96/2952   Qualifier: History of  By: [name] MD, Audelia Acton    . Venous insufficiency     PSH: Past Surgical History:  Procedure Laterality Date  . AMPUTATION     Right and left fifth toes.   . AV FISTULA PLACEMENT Left 03/21/2014   Procedure: ARTERIOVENOUS (AV) FISTULA CREATION with ultrasound;  Surgeon: [name], MD;  Location: [place];  Service: Vascular;  Laterality: Left;  . COLONOSCOPY    .  EYE SURGERY Bilateral    cataract and lens implant  . HERNIA REPAIR    . LIGATION OF COMPETING BRANCHES OF ARTERIOVENOUS FISTULA Left 06/29/2015   Procedure: LIGATION OF LEFT ARM RADIOCEPHALIC ARTERIOVENOUS FISTULA SIDE BRANCHES;  Surgeon: Conrad Sandusky, MD;  Location: [place];  Service: Vascular;  Laterality: Left;  . Popliteal to posterior tibial bypass     2006  . R knee arthoscopic repair of meniscus    . UMBILICAL HERNIA REPAIR      ALLERGIES: Allergies  Allergen Reactions  . Shrimp [Shellfish Allergy]  Swelling    MEDICATIONS: Current Facility-Administered Medications  Medication Dose Route Frequency Provider Last Rate Last Dose  . 0.9 %  sodium chloride infusion  250 mL Intravenous PRN Everrett Coombe, MD   Stopped at 03/27/17 1008  . 0.9 %  sodium chloride infusion  100 mL Intravenous PRN [name], MD      . 0.9 %  sodium chloride infusion  100 mL Intravenous PRN [name], MD      . acetaminophen (TYLENOL) tablet 650 mg  650 mg Oral Q6H PRN [name], MD   650 mg at 03/23/17 1856   Or  . acetaminophen (TYLENOL) suppository 650 mg  650 mg Rectal Q6H PRN [name], MD      . alteplase (CATHFLO ACTIVASE) injection 2 mg  2 mg Intracatheter Once PRN [name], MD      . aspirin EC tablet 81 mg  81 mg Oral q morning - 10a Rogue Bussing, MD   81 mg at 03/27/17 1047  . calcitRIOL (ROCALTROL) capsule 1.75 mcg  1.75 mcg Oral Q T,Th,Sa-HD Alric Seton, PA-C   1.75 mcg at 03/25/17 1311  . calcium acetate (PHOSLO) capsule 2,001 mg  2,001 mg Oral TID WC Alric Seton, PA-C   2,001 mg at 03/27/17 1311  . carvedilol (COREG) tablet 25 mg  25 mg Oral BID WC Fleet Contras, MD   25 mg at 03/27/17 1104  . Darbepoetin Alfa (ARANESP) injection 60 mcg  60 mcg Intravenous Q Thu-HD Alric Seton, PA-C   60 mcg at 03/23/17 1648  . dextromethorphan-guaiFENesin (MUCINEX DM) 30-600 MG per 12 hr tablet 1 tablet  1 tablet Oral BID Lupita Dawn, MD   1 tablet at 03/27/17 1047  . feeding supplement (NEPRO CARB STEADY) liquid 237 mL  237 mL Oral BID BM Alric Seton, PA-C   237 mL at 03/27/17 1412  . feeding supplement (PRO-STAT SUGAR FREE 64) liquid 30 mL  30 mL Oral Q1500 Lupita Dawn, MD   30 mL at 03/27/17 1414  . furosemide (LASIX) tablet 80 mg  80 mg Oral BID Smiley Houseman, MD   80 mg at 03/27/17 1049  . heparin injection 1,000 Units  1,000 Units Dialysis PRN [name], MD      . heparin injection 5,000 Units  5,000 Units Subcutaneous Q8H Rogue Bussing, MD   5,000 Units at 03/27/17 1312  . [START ON 03/28/2017] heparin injection 6,000 Units  6,000 Units Dialysis Once in dialysis [name], MD      . ipratropium-albuterol (DUONEB) 0.5-2.5 (3) MG/3ML nebulizer solution 3 mL  3 mL Nebulization Q4H PRN Lupita Dawn, MD      . lidocaine (PF) (XYLOCAINE) 1 % injection 5 mL  5 mL Intradermal PRN [name], MD      . lidocaine-prilocaine (EMLA) cream 1 application  1 application Topical PRN [name], MD      .  multivitamin (RENA-VIT) tablet 1 tablet  1 tablet Oral QHS Alric Seton, PA-C   1 tablet at 03/26/17 2042  . ondansetron (ZOFRAN) tablet 4 mg  4 mg Oral Q6H PRN [name], MD       Or  . ondansetron [facility]) injection 4 mg  4 mg Intravenous Q6H PRN [name], MD      . pentafluoroprop-tetrafluoroeth (GEBAUERS) aerosol 1 application  1 application Topical PRN [name], MD      . polyethylene glycol (MIRALAX / GLYCOLAX) packet 17 g  17 g Oral Daily PRN [name], MD      . sodium chloride flush (NS) 0.9 % injection 3 mL  3 mL Intravenous Q12H Everrett Coombe, MD   3 mL at 03/27/17 1049  . sodium chloride flush (NS) 0.9 % injection 3 mL  3 mL Intravenous PRN Everrett Coombe, MD      . urea 10 % lotion   Topical BID [name], MD      . vancomycin (VANCOCIN) IVPB 1000 mg/200 mL premix  1,000 mg Intravenous Q T,Th,Sa-HD Kimberly B Hammons, RPH   1,000 mg at 03/25/17 1003    LABS: Lab Results  Component Value Date   WBC 7.6 03/27/2017   HGB 9.1 (L) 03/27/2017   HCT 28.8 (L) 03/27/2017   MCV 94.4 03/27/2017   PLT 137 (L) 03/27/2017      Component Value Date/Time   NA 134 (L) 03/27/2017 0502   NA 138 03/03/2017 1635   NA 142 07/03/2014 0814   K 3.8 03/27/2017 0502   K 4.4 07/03/2014 0814   CL 95 (L) 03/27/2017 0502   CO2 28 03/27/2017 0502   CO2 20 (L) 07/03/2014 0814   GLUCOSE 142 (H) 03/27/2017 0502   GLUCOSE 136 07/03/2014 0814   BUN 41 (H) 03/27/2017 0502   BUN 36  (H) 03/03/2017 1635   BUN 93.6 (H) 07/03/2014 0814   CREATININE 9.80 (H) 03/27/2017 0502   CREATININE 7.3 (HH) 07/03/2014 0814   CALCIUM 9.7 03/27/2017 0502   CALCIUM 8.6 07/03/2014 0814   GFRNONAA 5 (L) 03/27/2017 0502   GFRNONAA 31 (L) 08/16/2012 1517   GFRAA 6 (L) 03/27/2017 0502   GFRAA 36 (L) 08/16/2012 1517   Lab Results  Component Value Date   INR 1.51 03/22/2017   INR 1.40 03/21/2017   INR 1.18 06/25/2015   No results found for: PTT  SOCIAL HISTORY: Social History   Social History  . Marital status: Legally Separated    Spouse name: N/A  . Number of children: 1  . Years of education: N/A   Occupational History  .  Disabled    Social History Main Topics  . Smoking status: Former Smoker    Quit date: 06/06/1979  . Smokeless tobacco: Former Systems developer  . Alcohol use No     Comment:   "years ago" , none now  . Drug use: No  . Sexual activity: No   Other Topics Concern  . Not on file   Social History Narrative   Lives alone.  Only son is deceased.     FAMILY HISTORY: Family History  Problem Relation Age of Onset  . Diabetes Mother   . Heart disease Mother   . Diabetes Brother     REVIEW OF SYSTEMS: Reviewed With the patient as per history of present illness.  Psych: Patient denies having dental phobia.  DENTAL HISTORY: CHIEF COMPLAINT: Patient referred by Dr. Frances Nickels in [facility] Medicine  for dental consultation.  HPI: MORLEY GAUMER is a 66 year old male with history of sepsis and bacteremia. Dental Consultation requested to evaluate for possible dental etiology for the bacteremia.    The patient currently denies acute toothaches, swellings, or abscesses. Patient was last seen in early 2018 by his primary dentist, Dr. Mellissa Kohut, for a dental extraction. Patient denies having any complications from that dental extraction. The patient does not seek regular dental care. The patient denies having any partial dentures. Patient knows that his teeth are bad.  Patient denies having dental phobia. Patient indicates that he dialyzes for end stage renal disease on Tuesday, Thursday, and Saturday.  DENTAL EXAMINATION: GENERAL: Patient is a well-developed, well-nourished male in no acute distress. HEAD AND NECK: There is no palpable neck lymphadenopathy. The patient denies acute TMJ symptoms. INTRAORAL EXAM: Patient has normal saliva. There is no evidence of oral abscess formation. DENTITION: Patient has multiple missing teeth numbers 12, 15, 17, 18, 19, and 29-32. Multiple retained root segments noted. PERIODONTAL: Patient has chronic, advanced periodontal disease with plaque and calculus accumulations, generalized gingival recession, and generalized tooth mobility. DENTAL CARIES/SUBOPTIMAL RESTORATIONS: Multiple dental caries are noted. ENDODONTIC: Patient currently denies acute pulpitis symptoms. There are multiple areas of periapical pathology and radiolucency. CROWN AND BRIDGE: There are no crown or bridge restorations. PROSTHODONTIC: Patient denies having partial dentures. OCCLUSION: Patient has a poor occlusal scheme secondary to multiple missing teeth, supra-eruption and drifting of the unopposed teeth into the edentulous areas, multiple retained root segments, and lack of replacement missing teeth with dental prostheses.  RADIOGRAPHIC INTERPRETATION: An orthopantogram was taken in Radiology. There are multiple missing teeth. There are multiple retained root segments. There is supra-eruption and drifting of the unopposed teeth into the edentulous areas. Dental caries are noted. There are multiple areas of periapical pathology and radiolucency.  ASSESSMENTS: 1. History of sepsis and bacteremia 2. End-stage renal disease with hemodialysis on Tuesday, Thursday, Saturday 3. Chronic apical periodontitis 4. Dental caries 5. Chronic periodontitis with bone loss 6. Generalized gingival recession 7. Generalized tooth mobility 8. Accretions 9. Multiple  missing teeth 10. Supra-eruption and drifting of the unopposed teeth into the edentulous areas 11. Poor occlusal scheme and malocclusion  12. Risk for bleeding with invasive dental procedures due to current heparin subcutaneous therapy  PLAN/RECOMMENDATIONS: 1. I discussed the risks, benefits, and complications of various treatment options with the patient in relationship to his medical and dental conditions, history of bacteremia, and risk for endocarditis. We discussed various treatment options to include no treatment, multiple extractions with alveoloplasty, pre-prosthetic surgery as indicated, periodontal therapy, dental restorations, root canal therapy, crown and bridge therapy, implant therapy, and replacement of missing teeth as indicated. The patient currently wishes to proceed with extraction of  remaining teeth with alveoloplasty in the operating room with general anesthesia. This can be scheduled as time and space permits for later this week as an inpatient or possibly next week as an outpatient.  I will discuss this further with Dr. Tommy Medal with Infectious disease and Family Medicine team. In the meantime, patient will remain on IV antibiotic therapy per infectious disease.   2. Discussion of findings with medical team and coordination of future medical and dental care as needed.    Lenn Cal, DDS

## 2017-03-27 NOTE — Anesthesia Preprocedure Evaluation (Addendum)
Anesthesia Evaluation  Patient identified by MRN, date of birth, ID band Patient awake    Reviewed: Allergy & Precautions, NPO status , Patient's Chart, lab work & pertinent test results  Airway Mallampati: II   Neck ROM: Full    Dental no notable dental hx.    Pulmonary asthma , former smoker,     + decreased breath sounds      Cardiovascular hypertension, + Peripheral Vascular Disease and +CHF   Rhythm:Regular Rate:Normal     Neuro/Psych    GI/Hepatic hiatal hernia,   Endo/Other  diabetes  Renal/GU Renal disease     Musculoskeletal  (+) Arthritis ,   Abdominal (+) + obese,   Peds  Hematology   Anesthesia Other Findings   Reproductive/Obstetrics                            Anesthesia Physical Anesthesia Plan  ASA: III  Anesthesia Plan: MAC   Post-op Pain Management:    Induction: Intravenous  Airway Management Planned: Nasal Cannula and Natural Airway  Additional Equipment:   Intra-op Plan:   Post-operative Plan:   Informed Consent: I have reviewed the patients History and Physical, chart, labs and discussed the procedure including the risks, benefits and alternatives for the proposed anesthesia with the patient or authorized representative who has indicated his/her understanding and acceptance.     Plan Discussed with: CRNA  Anesthesia Plan Comments:         Anesthesia Quick Evaluation

## 2017-03-27 NOTE — Interval H&P Note (Signed)
History and Physical Interval Note:  03/27/2017 8:45 AM  [name]  has presented today for surgery, with the diagnosis of endocarditis  The various methods of treatment have been discussed with the patient and family. After consideration of risks, benefits and other options for treatment, the patient has consented to  Procedure(s): TRANSESOPHAGEAL ECHOCARDIOGRAM (TEE) (N/A) as a surgical intervention .  The patient's history has been reviewed, patient examined, no change in status, stable for surgery.  I have reviewed the patient's chart and labs.  Questions were answered to the patient's satisfaction.     Fransico Him

## 2017-03-27 NOTE — Progress Notes (Signed)
Family Medicine Teaching Service Daily Progress Note Intern Pager: [phone]  Patient name: [name] Medical record number: [id number] Date of birth: [birth year] Age: 66 y.o. Gender: male  Primary Care Provider: Georges Lynch, MD Consultants: Nephro, Cardiology Code Status: Full  Pt Overview and Major Events to Date:  4/10- Admited to [place] 4/11- Rothia grew in 1/2 blood cultures  Assessment and Plan: [name] a 66 y.o. is here for sepsis, presenting with fever and weakness and found to have Rothia in 1/2 blood samples. Questionable diagnosis of new Afib on ED EKG, however has been NSR on telemetry. PMH is significant for diastolic CHF, ESRD on Dialysis, MGUS, DM, HTN, HLD, Asthma, morbid obesity, lymphedema, and venous insufficiency  Bacteremia:Vitals remain stable and pt afebrile.  Blood Cx 4/10: GPC in pairs and chains on gram stain, with rothia species in 1/2 bottles. Not thought to be a contaminant by ID. Blood cx 4/11: NG x 4 days. Suspected dental source however we will get a TEE today to r/o endocarditis. Urine culture with insignificant growth. CT neck without mass or fluid collection but left supraclavicular and right pericardial lymphadenopathy present. Orthopantogram with caries within right-sided supererupted maxillary molar teeth with 1-2 remaining root tips in the region of the premolars on the right as possible infectious source. TTE did not show evidence of vegetations.  - ID consulted, appreciate recommendations: Continue IV vancomycin to cover for Rothia until obtain TEE. If negative for vegetations, would need to complete 2 week course of vancomycin instead of 6 week extended course.  - Platelets 137 today  - Zosyn 4/10-4/14 - Vancomycin every other day since 4/10 >> - Oral surgery consult, appreciate recs  - Continue to f/u blood cultures   HTN - Elevated at 173/67 this AM. Continues to be bradycardic. Had been holding home regimen due to bradycardia and  normotensive on admission. Takes norvasc 10 mg QD, Coreg 25 mg BID, Lasix at home - Cardiology consulted, appreciate recs regarding decreasing his carvedilol, continuing his Lasix and restarting his norvasc.  Sinus Bradycardia: Asymptomatic. Recently has been in the high 40s to 50s on various Coreg doses. Dosing has been between 3.125 BID to 25 mg BID. He reports taking 50 BID but 25 BID listed on [facility] - Cardiology consult regarding titration of his coreg in the setting of bradycardia and new onset Afib that is now resolved.   New Afib, resolved: noted in EKG obtained in ED. Now NSR. CHADSVASC 4. Per cards, no anticoagulation in the setting of thrombocytopenia. Also would not start until after TEE. TSH normal. NSR, 1st degree AVB with PAC noted on tele this AM.  - Cards consult, appreciate recommendations: will ask if patient needs to be on anticoag in the future or if this episode is due to sepsis   - On SQ heparin for VTE prophylaxis   Gross Hematuria, resolved: After I/O cath in the ED. Likely traumatic.  - Platelets improving, 137 this AM  - Continue home aspirin  RUQ Abdominal Pain, resolved: started while in HD on day of admission. Not associated with food. No history of similar symptoms. Unclear in etiology.  - RUQ [place] --> Cholelithiasis without acute cholecystitis or biliary duct dilatation. - Hepatic function panel WNL - Hepatitis C ab and Hep B Ag negative  Elevated troponin, improving: 0.45 >0.46 >0.42 > 0.34. Flat, likely in the setting of ESRD with new onset afib. Unlikely ACS per cardiology - Continue ASA 81 mg  - ECHO with LVEF 55-60%,  mild LVH, G2DD  Thrombocytopenia/Anemia, improving:  Likely reactive in the setting of acute infection/stress. Hgb is stable. Plt improving. HIT panel negative. Peripheral smear unremarkable.  - heme-onc consult: likely reactive due to acute illness. SPEP is unchanged from prior. No further workup is needed at this time. Will arrange outpatient  follow up.  Moderate protein calorie malnutrition, improving: Albumin 2.7<2.8. Nutrition provided reccs, see below. Patient has been refusing nepro supplement but has taken prostat supplement. When discussed today he didn't like the Vanilla flavor but agreed to try the berry flavor.  - Continue to encourage patient to take nepro shake PO BID.  - Continue prostat supplement PO QD - Encourage adequate PO intake   ESRD on HD:  - nephrology consulted: HD tomorrow   Asthma: no sign of exacerbation - PRN duonebs  DM2:HgbA1c most recently 6.8 in 03/13/2017. Diet controlled. Glucose stable on BMP - monitor glucose on BMP  Hx of CHF: Echo on 03/24/2017 showing EF of55-60% and G2DD. Trace LE to mid-shin on exam 4/16. - Cardiology consulted, appreciate recs regarding decreasing his carvedilol, continuing his Lasix and restarting his norvasc.  HLD - Last lipid panel 03/13/2017 - Cholesterol 184, HDL 29, LDL 115, Triglyceride 201 - statin recommended by PCP at last outpatient visit - patient had adamantly refused statin - can continue to counsel  Morbid Obesity - weight loss counseling  Lymphedema: Chronic significant skin changes. - urea lotion   MGUS with smoldering multiple myeloma: Last seen by oncology in 06/2014 and patient was lost to follow up per chart review. SPEP unchanged from prior. Seen by oncology during hospitalization. Will need f/u appointment schedule and SPEP in 2019 per heme/onc.  FEN/GI: Renal diet, SLIV PPx: SQ heparin  Disposition: pending TEE.   Subjective:  Patient sitting in chair alert in NAD. Denies CP, SOB or RUQ pain today. No other complaints.   Objective: Temp:  [97.7 F (36.5 C)-98.1 F (36.7 C)] 97.9 F (36.6 C) (04/16 1004) Pulse Rate:  [49-58] 53 (04/16 1010) Resp:  [12-18] 14 (04/16 1010) BP: (160-239)/(57-85) 187/80 (04/16 1010) SpO2:  [100 %] 100 % (04/16 1010) Weight:  [122.9 kg (271 lb)-124 kg (273 lb 5.9 oz)] 122.9 kg (271 lb) (04/16  7628) Physical Exam: General: Well appearing, NAD, sitting in chair  Cardiovascular: RRR, no MRG Respiratory: CTAB. No increased work of breathing.   Abdomen: Soft, +BS, NT, ND Extremities: BL 5th toe amputations. Trace BLE pitting edema. R leg with increased width compared to left (chronic after R posterior tibial bypass per patient). HD fistula at left wrist.   Laboratory:  Recent Labs Lab 03/25/17 0736 03/26/17 0526 03/27/17 0502  WBC 8.8 7.7 7.6  HGB 9.1* 9.4* 9.1*  HCT 28.5* 29.6* 28.8*  PLT 66* 94* 137*    Recent Labs Lab 03/21/17 1450  03/24/17 0446 03/25/17 0736 03/26/17 0526 03/27/17 0502  NA 138  < > 133* 132* 135 134*  K 4.1  < > 4.3 4.2 3.9 3.8  CL 99*  < > 95* 96* 97* 95*  CO2 26  < > _0 BUN 37*  < > 33* 43* 29* 41*  CREATININE 10.82*  < > 8.10* 10.42* 7.85* 9.80*  CALCIUM 8.5*  < > 9.1 9.3 9.4 9.7  PROT 7.0  --  7.2  --   --   --   BILITOT 1.1  --  0.7  --   --   --   ALKPHOS 57  --  56  --   --   --  ALT 15*  --  18  --   --   --   AST 29  --  22  --   --   --   GLUCOSE 123*  < > 106* 115* 168* 142*  < > = values in this interval not displayed. TSH: 3.436 MRSA by PCR: Negative    Blood Cx 4/10: GPC in pairs and chains on gram stain 1/2 bottles Blood cx 4/11: NG x 4 days  Urine cx: insignificant growth   Imaging/Diagnostic Tests: No results found.  Colon Flattery, Medical Student  PGY-2, [facility] 03/27/2017, 1:32 PM   ________________________________________________________________________ I agree with the medical student's note above, with notable exceptions in the assessment and plan which I have given below.   Toretto Tingler [name] a 66 y.o. ishere for sepsis, presenting with fever and weakness and found to have Rothia in 1/2 blood samples.Diagnosis of new Afib on ED EKG, however has been NSR on telemetry. PMH is significant for diastolic CHF, ESRD on Dialysis, MGUS, DM, HTN, HLD, Asthma, morbid obesity,  lymphedema, and venous insufficiency  Physical Exam GEN: NAD, obese man HEENT: Atraumatic, normocephalic, neck supple, EOMI, sclera clear  CV: bradycardia, RR, no murmurs, rubs, or gallops PULM: CTAB, normal effort ABD: soft, NT, ND, + bowel sounds EXTR: chronic lymphedema with skin changes bilaterally  PSYCH: Mood and affect euthymic, normal rate and volume of speech NEURO: Awake, alert, no focal deficits grossly, normal speech  Blood Cx:  4/11: NG x 4 days 4/10: rothia in 1 bottle   Plan:  [place] [name] a 66 y.o. ishere for sepsis, presenting with fever and weakness and found to have Rothia in 1/2 blood samples. Diagnosis of new Afib on ED EKG, however has been NSR on telemetry. PMH is significant for diastolic CHF, ESRD on Dialysis, MGUS, DM, HTN, HLD, Asthma, morbid obesity, lymphedema, and venous insufficiency  Bacteremia:Blood Cx 4/10: GPC in pairs and chains on gram stain, with rothia species in 1/2 bottles. Not thought to be a contaminant by ID.Blood cx 4/11: NG x 3days. Suspected dental source. Orthopantogram with caries within right-sided supererupted maxillary molar teeth with 1-2 remaining root tips in the region of the premolars on the right as possible infectious source. TTE did not show evidence of vegetations. Cardiology unable to do TEE 4/16 - Dr. Enrique Sack consulted, appreciate reccs - ID consulted, appreciate recommendations:  Per note 4/14, will likely need Vanc x 4 weeks since unable to complete TEE. Will arrange ID follow up in 6 weeks.  - Zosyn 4/10-4/14 - Vancomycin every other day since 4/10 >> - follow blood cultures   Gross Hematuria, resolved: After I/O cath in the ED. Likely traumatic.  - ASA  Sinus Bradycardia: Asymptomatic  - currently on Coreg 25 BID, will ask cardiology regarding this (decreasing dose vs off and monitor)   RUQ Abdominal Pain, resolved: started while in HD on day of admission. Not associated with food. No history of  similar symptoms. Unclear in etiology.  - RUQ [place] --> Cholelithiasis without acute cholecystitis or biliary duct dilatation. - Hepatic function panel WNL - Hepatitis C ab and Hep B Ag negative  New Afib, resolved: noted in EKG obtained in ED. Now NSR. CHADSVASC 4. Per cards, no anticoagulation in the setting of thrombocytopenia. Also would not start until after TEE. TSH normal.  - cards consult, appreciate recommendations: will ask if patient needs to be on anticoag in the future or if this episode is due to  sepsis  - On SQ heparin for VTE prophylaxis as no longer in severe range thrombocytopenia  Elevated troponin: 0.45 >0.46 >0.42 > 0.34. Flat, likely in the setting of ESRD with new onset afib. Unlikely ACS per cardiology - Restart ASA 81 mg  - ECHO with LVEF 55-60%, mild LVH, G2DD  Thrombocytopenia/Anemia, improved: Likely reactive in the setting of acute infection/stress. Hgb is stable/improving. Plt improving. HIT panel negative. Peripheral smear unremarkable.  - heme-onc consult: likely reactive due to acute illness. SPEP is unchanged from prior.  ESRD on HD:  - nephrology consulted: HD Tuesday if still hospitalized, otherwise okay to discharge  Asthma: no sign of exacerbation - PRN duonebs  DM2:HgbA1c most recently 6.8 in 03/13/2017. Diet controlled. Glucose stable on BMP - monitor glucose on BMP  HTN -  Takes norvasc 10 mg QD, Coreg 25 mg BID, Lasix at home - Coreg 25 BID  - Lasix   Hx of CHF: Echo on 06/25/2015 showing no signs of CHF with EF of55%. TraceLE to mid-shin on exam 4/15. - Continue Lasix - Coreg 25 BID  HLD - Last lipid panel 03/13/2017 - Cholesterol 184, HDL 29, LDL 115, Triglyceride 201 - statin recommended by PCP at last outpatient visit - patient had adamantly refused statin - can continue to counsel  Morbid Obesity - weight loss counseling  Lymphedema: Chronic significant skin changes. - urea lotion   MGUS with smoldering multiple  myeloma: Last seen by oncology in 06/2014 and patient was lost to follow up per chart review. SPEP unchanged from prior. Seen by oncology during hospitalization   Protein Calorie Malnutrition, Mild: Albumin 2.7 - seen by nutrition   Onnie Boer, MD Family Medicine, PGY 2 03/27/2017 8:29 AM

## 2017-03-27 NOTE — H&P (View-Only) (Signed)
Cardiology Consult    Patient ID: [name]: [id number], DOB/AGE: Jan 01, 1951   Admit date: 03/21/2017 Date of Consult: 03/22/2017  Primary Physician: Georges Lynch, MD Primary Cardiologist: Dr. Percival Spanish Requesting Provider: Dr. Ree Kida  Reason for Consult: elevated troponin  Patient Profile    [name] is a 66 yo male with a PMH significant for DM, HTN, HLD, morbid obesity, atherosclerosis of native arteries in extremities, ESRD on HD, CHF, and venous stasis ulcers of lower extremities. He was brought to [facility] from dialysis for upper extremity tremors.  [name] is a 66 y.o. male who is being seen today for the evaluation of elevated troponin at the request of Dr. Ree Kida.   Past Medical History   Past Medical History:  Diagnosis Date  . Arthritis   . Asthma    as a child  . CHF (congestive heart failure) ([place])   . Chronic cystitis   . Chronic kidney disease   . Diabetes mellitus   . Dry skin   . H/O hiatal hernia    states it's been fixed  . Hyperlipidemia   . Hypertension   . Lymphedema   . Morbid obesity ([place])   . NEPHROLITHIASIS, HX OF 12/02/2009   Qualifier: Diagnosis of  By: [name], Cat    . PVD (peripheral vascular disease) ([place])   . UMBILICAL HERNIA 9/37/3428   Qualifier: History of  By: [name] MD, Audelia Acton    . Venous insufficiency     Past Surgical History:  Procedure Laterality Date  . AMPUTATION     Right and left fifth toes.   . AV FISTULA PLACEMENT Left 03/21/2014   Procedure: ARTERIOVENOUS (AV) FISTULA CREATION with ultrasound;  Surgeon: [name], MD;  Location: [place];  Service: Vascular;  Laterality: Left;  . COLONOSCOPY    . EYE SURGERY Bilateral    cataract and lens implant  . HERNIA REPAIR    . LIGATION OF COMPETING BRANCHES OF ARTERIOVENOUS FISTULA Left 06/29/2015   Procedure: LIGATION OF LEFT ARM RADIOCEPHALIC ARTERIOVENOUS FISTULA SIDE BRANCHES;  Surgeon: Conrad Windham, MD;  Location: [facility];  Service: Vascular;  Laterality:  Left;  . Popliteal to posterior tibial bypass     2006  . R knee arthoscopic repair of meniscus    . UMBILICAL HERNIA REPAIR       Allergies  Allergies  Allergen Reactions  . Iodine Swelling    06/29/2015: Patient stated IV contrast dye and iodine on skin has not caused any reaction.  . Shrimp [Shellfish Allergy] Swelling  . Contrast Media [Iodinated Diagnostic Agents] Swelling    History of Present Illness    [name] was at his regular HD session 03/21/17 when he began to have upper extremity tremors.  He completed half of his HD session and was found to have a temp of 102.  On my interview, he denies chest pain, palpitations, SOB, DOE, orthopnea, nausea and vomiting. He states he discontinued HD yesterday because he started shaking in his arms and hands. He was disconnected and sat in the corner of the facility because he was too weak to walk to his car. He tried to get up from the chair, but the chair rolled away and he fell. No injuries and no pain reported.  EMS was dispatched and brought him to [facility]. Code sepsis was initiated and he was started on vanc/zosyn. He denies sick contacts and recent illness. CXR shows bilateral pneumonitis.   He was found to  be in rate controlled atrial fibrillation. Home coreg was started at lower dose. He now has bouts of NSR and Afib on telemetry with HR in the 50s. He continues to deny any pain and has no complaints with the exception of "feeling weak."   Inpatient Medications    . aspirin EC  81 mg Oral q morning - 10a  . carvedilol  12.5 mg Oral BID WC  . dextromethorphan-guaiFENesin  1 tablet Oral BID  . piperacillin-tazobactam (ZOSYN)  IV  3.375 g Intravenous Q12H     Outpatient Medications    Prior to Admission medications   Medication Sig Start Date End Date Taking? Authorizing Provider  amLODipine (NORVASC) 10 MG tablet Take 10 mg by mouth every morning.   Yes Historical Provider, MD  calcium acetate (PHOSLO) 667 MG capsule  [phone] mg.  07/08/15  Yes Historical Provider, MD  carvedilol (COREG) 25 MG tablet Take 1 tablet (25 mg total) by mouth 2 (two) times daily with a meal. 03/11/14  Yes Frazier Richards, MD  furosemide (LASIX) 80 MG tablet Take 1 tablet (80 mg total) by mouth 2 (two) times daily. 06/15/16  Yes [name], MD  aspirin EC 81 MG tablet Take 81 mg by mouth every morning.    Historical Provider, MD     Family History    Family History  Problem Relation Age of Onset  . Diabetes Mother   . Heart disease Mother   . Diabetes Brother     Social History    Social History   Social History  . Marital status: Legally Separated    Spouse name: N/A  . Number of children: 1  . Years of education: N/A   Occupational History  .  Disabled    Social History Main Topics  . Smoking status: Former Smoker    Quit date: 06/06/1979  . Smokeless tobacco: Former Systems developer  . Alcohol use No     Comment:   "years ago" , none now  . Drug use: No  . Sexual activity: No   Other Topics Concern  . Not on file   Social History Narrative   Lives alone.  Only son is deceased.      Review of Systems    General:  No chills, fever, night sweats or weight changes.  Cardiovascular:  No chest pain, dyspnea on exertion, edema, orthopnea, palpitations, paroxysmal nocturnal dyspnea. Dermatological: No rash, lesions/masses Respiratory: No cough, dyspnea Urologic: No hematuria, dysuria Abdominal:   No nausea, vomiting, diarrhea, bright red blood per rectum, melena, or hematemesis Neurologic:  No visual changes, wkns, changes in mental status. All other systems reviewed and are otherwise negative except as noted above.  Physical Exam    Blood pressure (!) 114/46, pulse 71, temperature 99 F (37.2 C), temperature source Oral, resp. rate 18, height 5\' 11"  (1.803 m), weight 270 lb 8.1 oz (122.7 kg), SpO2 96 %.  General: Pleasant, NAD Psych: Normal affect. Neuro: Alert and oriented X 3. Moves all extremities  spontaneously. HEENT: Normal  Neck: Supple without bruits or JVD. Lungs:  Resp regular and unlabored, CTA. Heart: RRR no s3, s4, or murmurs. Abdomen: Soft, non-tender, non-distended, BS + x 4.  Extremities: No clubbing, cyanosis or edema. DP/PT/Radials 2+ and equal bilaterally.  Labs    Troponin (Point of Care Test) No results for input(s): TROPIPOC in the last 72 hours.  Recent Labs  03/21/17 2247 03/22/17 0640  TROPONINI 0.45* 0.46*   Lab Results  Component Value  Date   WBC 3.5 (L) 03/22/2017   HGB 9.4 (L) 03/22/2017   HCT 29.3 (L) 03/22/2017   MCV 95.1 03/22/2017   PLT 25 (LL) 03/22/2017    Recent Labs Lab 03/21/17 1450 03/22/17 0025  NA 138 136  K 4.1 5.1  CL 99* 99*  CO2 26 24  BUN 37* 49*  CREATININE 10.82* 11.44*  CALCIUM 8.5* 8.1*  PROT 7.0  --   BILITOT 1.1  --   ALKPHOS 57  --   ALT 15*  --   AST 29  --   GLUCOSE 123* 144*   Lab Results  Component Value Date   CHOL 184 03/13/2017   HDL 29 (L) 03/13/2017   LDLCALC 115 (H) 03/13/2017   TRIG 201 (H) 03/13/2017   Lab Results  Component Value Date   DDIMER 3.91 (H) 03/22/2017     Radiology Studies    Dg Chest 2 View  Result Date: 03/21/2017 CLINICAL DATA:  Fever, weakness, cough, shortness of Breath EXAM: CHEST  2 VIEW COMPARISON:  06/23/2015 FINDINGS: Cardiomediastinal silhouette is stable. Mild interstitial prominence bilateral lower lobes may represent mild interstitial edema scarring or pneumonitis. No segmental consolidation. IMPRESSION: Mild interstitial prominence bilateral lower lobes may represent mild interstitial edema scarring or pneumonitis. No segmental consolidation. Electronically Signed   By: Lahoma Crocker M.D.   On: 03/21/2017 15:43    ECG & Cardiac Imaging    Echocardiogram 03/22/17: pending   EKG 03/21/17: Afib, rate controlled   Echocardiogram 06/25/15: Study Conclusions - Left ventricle: The cavity size was mildly dilated. Wall   thickness was increased in a pattern of  mild LVH. The estimated   ejection fraction was 55%. Doppler parameters are consistent with   elevated ventricular end-diastolic filling pressure. - Atrial septum: No defect or patent foramen ovale was identified.   Myoview 01/01/14:  Low risk stress nuclear study Fixed defect in the inferior wall most consistent with diaphragmatic attenuation. LV Ejection Fraction: 51%.  LV Wall Motion:  Low normal LVF    Assessment & Plan    1. Elevated troponin - 0.45 --> 0.46 - troponin negative on 03/11/17 - elevated D dimer to 3.91 - recommend VQ scan for PE given his elevated D dimer and HD status, although he does not have SOB and is maintaining well on room air. - continue ASA, coreg 12.5 bid   - on telemetry, pt seems to be having bouts of NSR and Afib in the 50s - will repeat EKG - home meds include norvasc, coreg 25 mg bid, and lasix 80 mg BID - will repeat echo to evaluate valves and function; will also discuss inpatient myoview with attending. This elevated troponin may be secondary to possible DIC (thrombocytopenia and elevated D dimer)   2. New onset atrial fibrillation - This patients CHA2DS2-VASc Score and unadjusted Ischemic Stroke Rate (% per year) is equal to 7.2 % stroke rate/year from a score of 5 (CHF, HTN, DM, Vascular disease, age) - heparin has not been started secondary to thrombocytopenia - PLT: 25, question myeloma - atrial fibrillation is currently rate controlled and he is tolerating the rhythm well - would hold off on coumadin while platelets recover - K and TSH WNL, Mg pending   3. Fever of unknown origin - vanc/zosyn per primary team - blood cultures pending   4. Chronic diastolic heart failure - repeat echo pending - pt dry wt difficult to determine given HD status; 270 lbs on admission - he has lower  extremity edema, but per the patient he is at baseline - he does not appear extremely volume overloaded on exam - restart lasix per  nephrology    Signed, Tami Lin Duke, PA-C 03/22/2017, 10:56 AM [phone]  Agree with note by Fabian Sharp PA-C  Asked to see patient Hortencia Conradi by the primary care service for evaluation of mildly elevated. He has seen Dr. Percival Spanish in the past. He has chronic renal insufficiency on hemodialysis with. He has preserved LV function with history of diastolic dysfunction. troponin. He developed a "shaking sensation" in the middle of dialysis with a feeling of coldness. He is brought to the ER where he was found to be in A. fib with RVR. He has converted to sinus rhythm with episodes of PAF and PACs. He denies chest pain. His laboratory exam is remarkable for severe thrombocytopenia with a platelet count 25,000, mildly elevated troponin of 0.45 and a mildly elevated d-dimer. His EKG shows no acute changes. His exam is benign. I do not think this represents acute coronary syndrome. His troponin can be explained by his chronic renal insufficiency. I'm not sure the etiology of his elevated d-dimer but clearly is not a candidate for systemic anticoagulation given his trauma cytopenia. They be related to DIC versus sepsis versus a poorly myeloproliferative proliferative disorder. His rate is controlled currently. No further workup is recommended other than a 2-D echo which we will order. We will we will be happy to see back as needed.  Lorretta Harp, M.D., [place], [facility], [place] [address] [place], [place]  [id number]  [phone] 03/22/2017 12:49 PM

## 2017-03-28 ENCOUNTER — Other Ambulatory Visit: Payer: Self-pay | Admitting: Student

## 2017-03-28 DIAGNOSIS — I5032 Chronic diastolic (congestive) heart failure: Secondary | ICD-10-CM

## 2017-03-28 DIAGNOSIS — I4891 Unspecified atrial fibrillation: Secondary | ICD-10-CM

## 2017-03-28 DIAGNOSIS — I480 Paroxysmal atrial fibrillation: Secondary | ICD-10-CM

## 2017-03-28 DIAGNOSIS — I10 Essential (primary) hypertension: Secondary | ICD-10-CM

## 2017-03-28 LAB — CBC
HEMATOCRIT: 29.2 % — AB (ref 39.0–52.0)
Hemoglobin: 9.5 g/dL — ABNORMAL LOW (ref 13.0–17.0)
MCH: 30.9 pg (ref 26.0–34.0)
MCHC: 32.5 g/dL (ref 30.0–36.0)
MCV: 95.1 fL (ref 78.0–100.0)
Platelets: 177 10*3/uL (ref 150–400)
RBC: 3.07 MIL/uL — AB (ref 4.22–5.81)
RDW: 17.5 % — AB (ref 11.5–15.5)
WBC: 8.3 10*3/uL (ref 4.0–10.5)

## 2017-03-28 LAB — RENAL FUNCTION PANEL
ALBUMIN: 3 g/dL — AB (ref 3.5–5.0)
Anion gap: 12 (ref 5–15)
BUN: 54 mg/dL — ABNORMAL HIGH (ref 6–20)
CO2: 25 mmol/L (ref 22–32)
Calcium: 9.4 mg/dL (ref 8.9–10.3)
Chloride: 95 mmol/L — ABNORMAL LOW (ref 101–111)
Creatinine, Ser: 11.51 mg/dL — ABNORMAL HIGH (ref 0.61–1.24)
GFR, EST AFRICAN AMERICAN: 5 mL/min — AB (ref >60)
GFR, EST NON AFRICAN AMERICAN: 4 mL/min — AB (ref >60)
Glucose, Bld: 124 mg/dL — ABNORMAL HIGH (ref 65–99)
PHOSPHORUS: 5.7 mg/dL — AB (ref 2.5–4.6)
POTASSIUM: 4.2 mmol/L (ref 3.5–5.1)
Sodium: 132 mmol/L — ABNORMAL LOW (ref 135–145)

## 2017-03-28 LAB — GLUCOSE, CAPILLARY
GLUCOSE-CAPILLARY: 123 mg/dL — AB (ref 65–99)
Glucose-Capillary: 145 mg/dL — ABNORMAL HIGH (ref 65–99)

## 2017-03-28 LAB — VANCOMYCIN, RANDOM: Vancomycin Rm: 14

## 2017-03-28 MED ORDER — UREA 10 % EX LOTN: TOPICAL_LOTION | Freq: Two times a day (BID) | CUTANEOUS | 0 refills | Status: DC

## 2017-03-28 MED ORDER — VANCOMYCIN HCL 1000 MG IV SOLR: 1250.0000 mg | INTRAVENOUS | Status: DC

## 2017-03-28 MED ORDER — CLONIDINE HCL 0.1 MG/24HR TD PTWK
0.1000 mg | MEDICATED_PATCH | TRANSDERMAL | Status: DC
  Administered 2017-03-28: 0.1 mg via TRANSDERMAL
  Filled 2017-03-28: qty 1

## 2017-03-28 MED ORDER — VANCOMYCIN HCL 1000 MG IV SOLR
1500.0000 mg | INTRAVENOUS | Status: AC | PRN
  Administered 2017-03-28: 1500 mg via INTRAVENOUS
  Filled 2017-03-28: qty 1500

## 2017-03-28 MED ORDER — CLONIDINE HCL 0.1 MG/24HR TD PTWK: 0.1000 mg | MEDICATED_PATCH | TRANSDERMAL | 0 refills | Status: DC

## 2017-03-28 MED ORDER — CARVEDILOL 6.25 MG PO TABS
6.2500 mg | ORAL_TABLET | Freq: Two times a day (BID) | ORAL | Status: DC
  Administered 2017-03-28: 6.25 mg via ORAL
  Filled 2017-03-28: qty 1

## 2017-03-28 MED ORDER — CALCITRIOL 0.25 MCG PO CAPS
ORAL_CAPSULE | ORAL | Status: AC
  Administered 2017-03-28: 0.25 ug
  Filled 2017-03-28: qty 1

## 2017-03-28 MED ORDER — CARVEDILOL 6.25 MG PO TABS: 6.2500 mg | ORAL_TABLET | Freq: Two times a day (BID) | ORAL | 1 refills | Status: DC

## 2017-03-28 MED ORDER — CALCITRIOL 0.5 MCG PO CAPS
ORAL_CAPSULE | ORAL | Status: AC
  Filled 2017-03-28: qty 3

## 2017-03-28 MED ORDER — VANCOMYCIN IV (FOR PTA / DISCHARGE USE ONLY): 1250.0000 mg | INTRAVENOUS | 0 refills | Status: AC

## 2017-03-28 NOTE — Progress Notes (Addendum)
INFECTIOUS DISEASE PROGRESS NOTE  ID: [name] is a 66 y.o. male with  Active Problems:   Sepsis ([place])   Other pancytopenia ([facility])   Febrile illness   Gross hematuria   Bacteremia   RUQ abdominal pain   Elevated lactic acid level  Subjective: Without complaints Ambulating in hall  Abtx:  Anti-infectives    Start     Dose/Rate Route Frequency Ordered Stop   03/30/17 1200  vancomycin (VANCOCIN) 1,250 mg in sodium chloride 0.9 % 250 mL IVPB     1,250 mg 250 mL/hr over 60 Minutes Intravenous Every T-Th-Sa (Hemodialysis) 03/28/17 1037     03/30/17 0000  vancomycin IVPB     1,250 mg Intravenous Every T-Th-Sa (Hemodialysis) 03/28/17 1659 04/19/17 2359   03/28/17 1200  vancomycin (VANCOCIN) 1,500 mg in sodium chloride 0.9 % 250 mL IVPB     1,500 mg 250 mL/hr over 60 Minutes Intravenous Every Dialysis 03/28/17 1037 03/28/17 1233   03/23/17 1200  vancomycin (VANCOCIN) IVPB 1000 mg/200 mL premix  Status:  Discontinued     1,000 mg 200 mL/hr over 60 Minutes Intravenous Every T-Th-Sa (Hemodialysis) 03/23/17 0859 03/28/17 1038   03/22/17 0300  piperacillin-tazobactam (ZOSYN) IVPB 3.375 g  Status:  Discontinued     3.375 g 12.5 mL/hr over 240 Minutes Intravenous Every 12 hours 03/21/17 1533 03/24/17 1506   03/21/17 1530  piperacillin-tazobactam (ZOSYN) IVPB 3.375 g     3.375 g 100 mL/hr over 30 Minutes Intravenous  Once 03/21/17 1518 03/21/17 1658   03/21/17 1530  vancomycin (VANCOCIN) IVPB 1000 mg/200 mL premix  Status:  Discontinued     1,000 mg 200 mL/hr over 60 Minutes Intravenous  Once 03/21/17 1518 03/21/17 1528   03/21/17 1530  vancomycin (VANCOCIN) 2,000 mg in sodium chloride 0.9 % 500 mL IVPB     2,000 mg 250 mL/hr over 120 Minutes Intravenous  Once 03/21/17 1528 03/21/17 1821      Medications:  Scheduled: . aspirin EC  81 mg Oral q morning - 10a  . calcitRIOL      . calcitRIOL  1.75 mcg Oral Q T,Th,Sa-HD  . calcium acetate  2,001 mg Oral TID WC  . carvedilol   6.25 mg Oral BID WC  . cloNIDine  0.1 mg Transdermal Q3 days  . darbepoetin (ARANESP) injection - DIALYSIS  60 mcg Intravenous Q Thu-HD  . dextromethorphan-guaiFENesin  1 tablet Oral BID  . feeding supplement (NEPRO CARB STEADY)  237 mL Oral BID BM  . feeding supplement (PRO-STAT SUGAR FREE 64)  30 mL Oral Q1500  . furosemide  80 mg Oral BID  . heparin  5,000 Units Subcutaneous Q8H  . multivitamin  1 tablet Oral QHS  . sodium chloride flush  3 mL Intravenous Q12H  . urea   Topical BID    Objective: Vital signs in last 24 hours: Temp:  [97.8 F (36.6 C)-99 F (37.2 C)] 99 F (37.2 C) (04/17 1457) Pulse Rate:  [41-94] 58 (04/17 1457) Resp:  [14-18] 18 (04/17 1457) BP: (125-204)/(64-138) 125/93 (04/17 1457) SpO2:  [94 %-100 %] 100 % (04/17 1457) Weight:  [122.2 kg (269 lb 6.4 oz)-124 kg (273 lb 5.9 oz)] 122.2 kg (269 lb 6.4 oz) (04/17 1250)   General appearance: alert, cooperative and no distress Throat: abnormal findings: dentition: poor Resp: clear to auscultation bilaterally Cardio: regular rate and rhythm and systolic murmur: early systolic 2/6, crescendo at 2nd left intercostal space GI: normal findings: bowel sounds normal and soft,  non-tender Extremities: LUE AVF + bruit  Lab Results  Recent Labs  03/27/17 0502 03/28/17 0446  WBC 7.6 8.3  HGB 9.1* 9.5*  HCT 28.8* 29.2*  NA 134* 132*  K 3.8 4.2  CL 95* 95*  CO2 28 25  BUN 41* 54*  CREATININE 9.80* 11.51*   Liver Panel  Recent Labs  03/27/17 0502 03/28/17 0446  ALBUMIN 2.8* 3.0*   Sedimentation Rate No results for input(s): ESRSEDRATE in the last 72 hours. C-Reactive Protein No results for input(s): CRP in the last 72 hours.  Microbiology: Recent Results (from the past 240 hour(s))  Culture, blood (Routine x 2)     Status: Abnormal   Collection Time: 03/21/17  2:55 PM  Result Value Ref Range Status   Specimen Description BLOOD RIGHT ANTECUBITAL  Final   Special Requests BOTTLES DRAWN AEROBIC AND  ANAEROBIC  BCAV  Final   Culture  Setup Time   Final    GRAM POSITIVE COCCI IN CHAINS IN PAIRS AEROBIC BOTTLE ONLY Organism ID to follow CRITICAL RESULT CALLED TO, READ BACK BY AND VERIFIED WITH: A MEYER PHARMD 2025 03/22/17 A BROWNING    Culture (A)  Final    ROTHIA SPECIES Standardized susceptibility testing for this organism is not available.    Report Status 03/24/2017 FINAL  Final  Blood Culture ID Panel (Reflexed)     Status: None   Collection Time: 03/21/17  2:55 PM  Result Value Ref Range Status   Enterococcus species NOT DETECTED NOT DETECTED Final   Listeria monocytogenes NOT DETECTED NOT DETECTED Final   Staphylococcus species NOT DETECTED NOT DETECTED Final   Staphylococcus aureus NOT DETECTED NOT DETECTED Final   Streptococcus species NOT DETECTED NOT DETECTED Final   Streptococcus agalactiae NOT DETECTED NOT DETECTED Final   Streptococcus pneumoniae NOT DETECTED NOT DETECTED Final   Streptococcus pyogenes NOT DETECTED NOT DETECTED Final   Acinetobacter baumannii NOT DETECTED NOT DETECTED Final   Enterobacteriaceae species NOT DETECTED NOT DETECTED Final   Enterobacter cloacae complex NOT DETECTED NOT DETECTED Final   Escherichia coli NOT DETECTED NOT DETECTED Final   Klebsiella oxytoca NOT DETECTED NOT DETECTED Final   Klebsiella pneumoniae NOT DETECTED NOT DETECTED Final   Proteus species NOT DETECTED NOT DETECTED Final   Serratia marcescens NOT DETECTED NOT DETECTED Final   Haemophilus influenzae NOT DETECTED NOT DETECTED Final   Neisseria meningitidis NOT DETECTED NOT DETECTED Final   Pseudomonas aeruginosa NOT DETECTED NOT DETECTED Final   Candida albicans NOT DETECTED NOT DETECTED Final   Candida glabrata NOT DETECTED NOT DETECTED Final   Candida krusei NOT DETECTED NOT DETECTED Final   Candida parapsilosis NOT DETECTED NOT DETECTED Final   Candida tropicalis NOT DETECTED NOT DETECTED Final  Culture, blood (Routine x 2)     Status: None   Collection Time:  03/21/17  3:00 PM  Result Value Ref Range Status   Specimen Description BLOOD RIGHT HAND  Final   Special Requests BOTTLES DRAWN AEROBIC ONLY  BCAV  Final   Culture NO GROWTH 5 DAYS  Final   Report Status 03/26/2017 FINAL  Final  MRSA PCR Screening     Status: None   Collection Time: 03/22/17  5:23 AM  Result Value Ref Range Status   MRSA by PCR NEGATIVE NEGATIVE Final    Comment:        The GeneXpert MRSA Assay (FDA approved for NASAL specimens only), is one component of a comprehensive MRSA colonization surveillance program. It is not  intended to diagnose MRSA infection nor to guide or monitor treatment for MRSA infections.   Urine culture     Status: Abnormal   Collection Time: 03/22/17  2:20 PM  Result Value Ref Range Status   Specimen Description URINE, CLEAN CATCH  Final   Special Requests NONE  Final   Culture <10,000 COLONIES/mL INSIGNIFICANT GROWTH (A)  Final   Report Status 03/23/2017 FINAL  Final  Culture, blood (routine x 2)     Status: None   Collection Time: 03/22/17  9:14 PM  Result Value Ref Range Status   Specimen Description BLOOD RIGHT ANTECUBITAL  Final   Special Requests   Final    BOTTLES DRAWN AEROBIC AND ANAEROBIC Blood Culture adequate volume   Culture NO GROWTH 5 DAYS  Final   Report Status 03/27/2017 FINAL  Final  Culture, blood (routine x 2)     Status: None   Collection Time: 03/22/17  9:18 PM  Result Value Ref Range Status   Specimen Description BLOOD RIGHT HAND  Final   Special Requests IN PEDIATRIC BOTTLE Blood Culture adequate volume  Final   Culture NO GROWTH 5 DAYS  Final   Report Status 03/27/2017 FINAL  Final    Studies/Results: No results found.   Assessment/Plan: Multiple Myeloma Rothia bacteremia Poor dentition  His TEE was not able to be done. No further fevers Repeat BCx are (-)  He has a murmur Would give him 1 month of vanco with hd since we are not able to r/o IE.  Available as needed.   Total days of  antibiotics: 7 vanco   Allergies  Allergen Reactions  . Shrimp [Shellfish Allergy] Swelling    Discharge antibiotics: Per pharmacy protocol vancomycin with hemodialysis Aim for Vancomycin trough 15-20 (unless otherwise indicated) Duration: 21 days End Date: 04-17-17  [facility] Care Per Protocol:  Labs weekly while on IV antibiotics: _x_ CBC with differential __ BMP _x_ CMP __ CRP __ ESR _x_ Vancomycin trough  NO pic  Fax weekly labs to [phone]  Clinic Follow Up Appt: PCP for repeat BCx          Bobby Rumpf Infectious Diseases (pager) [phone] [email or web address] 03/28/2017, 5:49 PM  LOS: 7 days

## 2017-03-28 NOTE — Progress Notes (Signed)
Nsg Discharge Note  Admit Date:  03/21/2017 Discharge date: 03/28/2017   [name] to be D/C'd Home per MD order.  AVS completed.  Copy for chart, and copy for patient signed, and dated. Patient/caregiver able to verbalize understanding.  Discharge Medication: Allergies as of 03/28/2017      Reactions   Shrimp [shellfish Allergy] Swelling      Medication List    TAKE these medications   amLODipine 10 MG tablet Commonly known as:  NORVASC Take 10 mg by mouth every morning.   aspirin EC 81 MG tablet Take 81 mg by mouth every morning.   calcium acetate 667 MG capsule Commonly known as:  PHOSLO 1,334-2,668 mg.   carvedilol 6.25 MG tablet Commonly known as:  COREG Take 1 tablet (6.25 mg total) by mouth 2 (two) times daily with a meal. What changed:  medication strength  how much to take   cloNIDine 0.1 mg/24hr patch Commonly known as:  CATAPRES - Dosed in mg/24 hr Place 1 patch (0.1 mg total) onto the skin every 3 (three) days.   furosemide 80 MG tablet Commonly known as:  LASIX Take 1 tablet (80 mg total) by mouth 2 (two) times daily.   urea 10 % lotion Apply topically 2 (two) times daily.   vancomycin IVPB Inject 1,250 mg into the vein Every Tuesday,Thursday,and Saturday with dialysis. Indication:  bacteremia Last Day of Therapy:  04/19/18 Labs - Per HD Center Start taking on:  03/30/2017            Home Infusion Instuctions        Start     Ordered   03/28/17 0000  Home infusion instructions [facility] May follow [place] [facility] Dosing Protocol; May administer Cathflo as needed to maintain patency of vascular access device.; Flushing of vascular access device: per [facility] Protocol: 0.9% NaCl pre/post medica...    Question Answer Comment  Instructions May follow [place] Dosing Protocol   Instructions May administer Cathflo as needed to maintain patency of vascular access device.   Instructions Flushing of vascular access device: per [facility] Protocol:  0.9% NaCl pre/post medication administration and prn patency; Heparin 100 u/ml, 83ml for implanted ports and Heparin 10u/ml, 40ml for all other central venous catheters.   Instructions May follow [facility] Anaphylaxis Protocol for First Dose Administration in the home: 0.9% NaCl at 25-50 ml/hr to maintain IV access for protocol meds. Epinephrine 0.3 ml IV/IM PRN and Benadryl 25-50 IV/IM PRN s/s of anaphylaxis.   Instructions [facility] Infusion Coordinator (RN) to assist per patient IV care needs in the home PRN.      03/28/17 1659      Discharge Assessment: Vitals:   03/28/17 1457 03/28/17 1757  BP: (!) 125/93 (!) 205/71  Pulse: (!) 58 64  Resp: 18   Temp: 99 F (37.2 C)    Skin clean, dry and intact without evidence of skin break down, no evidence of skin tears noted. IV catheter discontinued intact. Site without signs and symptoms of complications - no redness or edema noted at insertion site, patient denies c/o pain - only slight tenderness at site.  Dressing with slight pressure applied.  D/c Instructions-Education: Discharge instructions given to patient/family with verbalized understanding. D/c education completed with patient/family including follow up instructions, medication list, d/c activities limitations if indicated, with other d/c instructions as indicated by MD - patient able to verbalize understanding, all questions fully answered. Patient instructed to return to ED, call 911, or  call MD for any changes in condition.  Patient escorted via [place], and D/C home via private auto.  Patrick Heath Margaretha Sheffield, RN 03/28/2017 6:57 PM

## 2017-03-28 NOTE — Progress Notes (Signed)
PHARMACY CONSULT NOTE FOR:  OUTPATIENT  PARENTERAL ANTIBIOTIC THERAPY (OPAT)  Indication: Bacteremia Regimen: Vancomycin 1250 mg QTTS HD End date:  Approximately 04/19/17  IV antibiotic discharge orders are pended. To discharging provider:  please sign these orders via discharge navigator,  Select New Orders & click on the button choice - Manage This Unsigned Work.     Thank you for allowing pharmacy to be a part of this patient's care.  Ihor Austin, PharmD PGY1 Pharmacy Resident Pager: [phone] 03/28/2017, 4:31 PM

## 2017-03-28 NOTE — Procedures (Signed)
Unable to pull more than 2- 2.5 kg with HD today.  He says he usually gets to 116kg as OP, but did standing wt today and is still up many kg, pre HD wt today was 125kg.  Will plan HD again tomorrow as extra Rx if pt is still here.    I was present at this dialysis session, have reviewed the session itself and made  appropriate changes [name] MD [place] pager [phone]   03/28/2017, 1:03 PM

## 2017-03-28 NOTE — Progress Notes (Signed)
PT Cancellation Note  Patient Details Name: RODERT HINCH MRN: [id number] DOB: [birth year]   Cancelled Treatment:    Reason Eval/Treat Not Completed: Patient at procedure or test/unavailable.  Pt has been in HD each time of arrival.  We see later as able. 03/28/2017  Donnella Sham, [place] [phone]  (pager)   Tessie Fass Fradel Baldonado 03/28/2017, 1:24 PM

## 2017-03-28 NOTE — Progress Notes (Signed)
Pharmacy Antibiotic Note  [name] is a 66 y.o. male admitted on 03/21/2017 with sepsis.  Pharmacy has been consulted for Vancomycin dosing for rothia bacteremia. Repeat cultures from 4/11 negative. Patient is afebrile and WBC are within normal limits. TEE unable to be performed on Monday. Pt has ESRD and receives HD TTS. Patient has been tolerating HD sessions well. Currently in HD session on 4/17. Pre-HD Vancomycin level low at 14.  Plan: Vancomycin 1500 mg X 1 after HD today Vancomycin 1250 mg  QHD TTS starting 4/19 Recommend  Pre-HD Vanc level 4/24 if vancomycin continued  Follow-up ID recommendations for changes in therapy and length (~ 4 weeks per last note)  Height: 5\' 11"  (180.3 cm) Weight: 273 lb 5.9 oz (124 kg) IBW/kg (Calculated) : 75.3  Temp (24hrs), Avg:97.9 F (36.6 C), Min:97.8 F (36.6 C), Max:98.1 F (36.7 C)   Recent Labs Lab 03/21/17 1723 03/21/17 2156 03/22/17 0025 03/22/17 0640 03/22/17 1207  03/24/17 0446 03/25/17 0736 03/26/17 0526 03/27/17 0502 03/28/17 0446 03/28/17 0900  WBC  --   --  3.5*  --   --   < > 8.7 8.8 7.7 7.6 8.3  --   CREATININE  --   --  11.44*  --   --   < > 8.10* 10.42* 7.85* 9.80* 11.51*  --   LATICACIDVEN 3.35* 2.8* 2.4* 1.3 1.5  --   --   --   --   --   --   --   VANCORANDOM  --   --   --   --   --   --   --   --   --   --   --  14  < > = values in this interval not displayed.  Estimated Creatinine Clearance: 8.4 mL/min (A) (by C-G formula based on SCr of 11.51 mg/dL (H)).    Allergies  Allergen Reactions  . Shrimp [Shellfish Allergy] Swelling   Antimicrobials this admission: 4/10 Vanc >>>> 4/10 Zosyn >> 4/13   Dose Adjsutments: 4/17 VR= 14 -  1500 mg X 1, increase maintenance dose to 1250 mg QHD TTS   Microbiology: 4/11 MRSA PCR negative 4/10 blood x 2 >> 1/2 GPC pairs and chains =Rothia species 4/10 BCID call >> cultures: 1 of 2 blood cultures showing GPC in chains and pairs. BCID shows no organisms 4/11 UCx :  insign growth/neg 4/11 BCx x2:  ngf   Thank you for allowing pharmacy to be a part of this patient's care.  Uvaldo Bristle, PharmD PGY1 Pharmacy Resident [phone]) 03/28/2017 10:39 AM

## 2017-03-28 NOTE — Evaluation (Signed)
Physical Therapy Evaluation Patient Details Name: LAVELLE AKEL MRN: [id number] DOB: 04-May-1951 Today's Date: 03/28/2017   History of Present Illness  [name] is a 66 y.o. is here for sepsis, presenting with fever and weakness and found to have Rothia in 1/2 blood samples. Questionable diagnosis of new Afib on ED EKG, however has been NSR on telemetry.  PMH is significant for diastolic CHF (ECHO with LVEF 55-60%, mild LVH, G2DD), ESRD on Dialysis, MGUS, DM, HTN, HLD, Asthma, morbid obesity, lymphedema, and venous insufficiency  Clinical Impression  Pt is at or close to baseline functioning and should be safe at home alone. There are no further acute PT needs.  Will sign off at this time.     Follow Up Recommendations No PT follow up    Equipment Recommendations  None recommended by PT    Recommendations for Other Services       Precautions / Restrictions Precautions Precautions: Fall      Mobility  Bed Mobility               General bed mobility comments: OOB in recliner  Transfers Overall transfer level: Independent                  Ambulation/Gait Ambulation/Gait assistance: Modified independent (Device/Increase time) Ambulation Distance (Feet): 300 Feet Assistive device: Straight cane     Gait velocity interpretation: at or above normal speed for age/gender General Gait Details: steady at age appropriate speed with safe use of the cane  Stairs            Wheelchair Mobility    Modified Rankin (Stroke Patients Only)       Balance Overall balance assessment: Needs assistance   Sitting balance-Leahy Scale: Normal       Standing balance-Leahy Scale: Good                               Pertinent Vitals/Pain Pain Assessment: No/denies pain    Home Living Family/patient expects to be discharged to:: Private residence   Available Help at Discharge: Friend(s);Available PRN/intermittently Type of Home:  Apartment Home Access: Elevator     Home Layout: One level Home Equipment: Cane - single point      Prior Function Level of Independence: Independent;Independent with assistive device(s)         Comments: uses spc     Hand Dominance        Extremity/Trunk Assessment   Upper Extremity Assessment Upper Extremity Assessment: Overall WFL for tasks assessed    Lower Extremity Assessment Lower Extremity Assessment: Overall WFL for tasks assessed       Communication   Communication: No difficulties  Cognition Arousal/Alertness: Awake/alert Behavior During Therapy: WFL for tasks assessed/performed Overall Cognitive Status: Within Functional Limits for tasks assessed                                        General Comments      Exercises     Assessment/Plan    PT Assessment Patent does not need any further PT services  PT Problem List         PT Treatment Interventions      PT Goals (Current goals can be found in the Care Plan section)  Acute Rehab PT Goals Patient Stated Goal: get home PT Goal Formulation: All  assessment and education complete, DC therapy    Frequency     Barriers to discharge        Co-evaluation               End of Session   Activity Tolerance: Patient tolerated treatment well Patient left: Other (comment) (standing with nurse and MD) Nurse Communication: Mobility status PT Visit Diagnosis: Unsteadiness on feet (R26.81)    Time: 5945-8592 PT Time Calculation (min) (ACUTE ONLY): 14 min   Charges:   PT Evaluation $PT Eval Low Complexity: 1 Procedure     PT G Codes:        04/12/17  Donnella Sham, PT [phone]  (pager)  Tessie Fass Azaliah Carrero 2017-04-12, 6:01 PM

## 2017-03-28 NOTE — Discharge Summary (Signed)
[facility] Discharge Summary  Patient name: [name] Medical record number: [id number] Date of birth: 1951-08-12 Age: 66 y.o. Gender: male Date of Admission: 03/21/2017  Date of Discharge: 03/28/2017 Admitting Physician: Lupita Dawn, MD  Primary Care Provider: Georges Lynch, MD Consultants: Nephrology, Cardiology  Indication for Hospitalization: SIRS, rothia bacteremia  Discharge Diagnoses/Problem List:  Rothia bacteremia  Thrombocytopenia Atrial fibrillation, new onset, resolved Diastolic CHF ESRD on HD MGUS Diabetes mellitus  HTN HLD Asthma  Morbid obesity Lymphedema Venous insufficiency    Disposition: Home  Discharge Condition: Stable  Discharge Exam:  Temp:  [97.8 F (36.6 C)-98.1 F (36.7 C)] 97.8 F (36.6 C) (04/17 0542) Pulse Rate:  [45-94] 81 (04/17 0542) Resp:  [14-18] 18 (04/17 0542) BP: (132-187)/(57-80) 170/64 (04/17 0542) SpO2:  [94 %-100 %] 100 % (04/17 0542) Weight:  [273 lb (123.8 kg)] 273 lb (123.8 kg) (04/17 0542) Physical Exam: General: NAD, rests comfortably in bed playing chess on his phone Cardiovascular: regular but bradycardic, no M/R/G Respiratory: CTA bil, no W/R/R Abdomen: soft and nontender, nondistended Extremities: +LE chronic skin changes, compression socks in place  Brief Hospital Course:  Patrick Haberland Clarkis a 66 y.o. is here for sepsis, presenting with fever and weakness and found to have Rothia in 1/2 blood samples. Questionable diagnosis of new Afib on ED EKG, however has been NSR on telemetry. PMH is significant for diastolic CHF, ESRD on Dialysis, MGUS, DM, HTN, HLD, Asthma, morbid obesity, lymphedema, and venous insufficiency  Thrombocytopenia On admission, platelets were  DIC panel showed PT 18.3, INR 1.51, aPTT 39, fibrinogen 726, d-dimer 3.91 and platelets 25. No schistocytes were seen on smear. Retic count was nl. B12 levels were nl. Haptoglobin and LDH was nl. Anemia and  thrombocytopenia are well known complications of patient's with chronic kidney disease as well as patient on HD. Etiologies for his pancytopenia include DIC, smoldering multiple myeloma that has progressed to multiple myeloma (less likely) or this may be dilutional. Other etiologies of thrombocytopenia include DIC and TTP.Likely reactive in the setting of acute infection/stress. Hgb is stable. Plt improving. HIT panel negative. Peripheral smear unremarkabheme-onc consult: likely reactive due to acute illness. SPEP is unchanged from prior. No further workup is needed at this time.   New Atrial fibrillation, converted to NSR On admission, EKG obtained showed atrial fibrillation. CHADSVASC 4. Patient also was thrombocytopenic so cardiology recommended that his anticoagulation be held. After being placed on telemetry patient went into normal sinus rhythm with a normal rate. TSH was normal. Cardiology recommended a 30 day event monitor as an outpatient to assess for recurrence of afib. They did not recommend for him to be on anticoagulation at this time.   Sinus Bradycardia, stable: On admission, his pulse was stable in the 70s and 80s. Throughout his hospital stay he was bradycardic but asymptomatic. His pulse and blood pressures were monitored each day and his Coreg doses were titrated accordingly. On discharge, patient was stable and asymptomatic.    Rothia bacteremia Patient was stable and without fever or leukocytosis. Rothia species grew in 1/2 bottles. Blood cultures were redrawn remained negative for the remaining hospital stay. ID was consulted and did not think this was a contaminant. CT neck without mass or fluid collection but left supraclavicular and right pericardial lymphadenopathy present.  Orthopantogram showed caries within right-sided supererupted maxillary molar teeth with 1-2 remaining root tips in the region of the premolars on the right as possible infectious source. TTE did not show  evidence of vegetations. A TEE was ordered to r/o endocarditis however it was unsuccessful after multiple attempts to place the probe without success. He had been on Vancomycin every other day since 4/10. Per ID we continued him on IV vancomycin to cover for Rothia. On discharge, he was scheduled for IV Vancomycin with his HD. Oral surgery will extract his teeth next week as an outpatient.   MGUS with smoldering multiple myeloma: Last seen by oncology in 06/2014 and patient was lost to follow up per chart review.Heme-onc was consulted and found that his SPEP was unchanged from prior. His smoldering multiple myeloma is stable and not progressing to multiple myeloma.    Issues for Follow Up:  1. ID recs: Continue vancomycin with HD for 4 weeks 2. Dental recs: Patient needs remaining teeth extracted by dentist as outpatient - scheduled for 4/27 3. Atrial fibrillation, new this admission: discharged on coreg for rate control held anticoagulation given NSR, thrombocytopenia this admission and upcoming dental procedure. 4. Bradycardia:  Discharged on reduced dose coreg because bradycardic throughout hospital stay. Please follow up HR and ensure still rate controlled 5. Lymphedema - skin changes improved with urea lotion, rec continuing at DC 6. Thrombocytopenia - improved at discharge, platelets 25>>177. Consider rechecking BMP at hospital follow up  Significant Procedures: None  Significant Labs and Imaging:  Laboratory: Cholesterol 184, HDL 29, LDL 115, Triglyceride 201 HIT panel negative Periph smear unremarkable TSH Hep B neg Folate 271 B12 266 LDH 154 Haptoglobin 162 Retic 1.3 Initial DIC panel with elevated fibrinogen, D-dimer, PTT    Recent Labs Lab 03/26/17 0526 03/27/17 0502 03/28/17 0446  WBC 7.7 7.6 8.3  HGB 9.4* 9.1* 9.5*  HCT 29.6* 28.8* 29.2*  PLT 94* 137* 177    Recent Labs Lab 03/24/17 0446 03/25/17 0736 03/26/17 0526 03/27/17 0502 03/28/17 0446  NA 133*  132* 135 134* 132*  K 4.3 4.2 3.9 3.8 4.2  CL 95* 96* 97* 95* 95*  CO2 '29 26 30 28 25  ' GLUCOSE 106* 115* 168* 142* 124*  BUN 33* 43* 29* 41* 54*  CREATININE 8.10* 10.42* 7.85* 9.80* 11.51*  CALCIUM 9.1 9.3 9.4 9.7 9.4  PHOS  --  5.1* 4.5 5.5* 5.7*  ALKPHOS 56  --   --   --   --   AST 22  --   --   --   --   ALT 18  --   --   --   --   ALBUMIN 2.6* 2.7* 2.6* 2.8* 3.0*     Results/Tests Pending at Time of Discharge: None  Discharge Medications:  Allergies as of 03/28/2017      Reactions   Shrimp [shellfish Allergy] Swelling      Medication List    TAKE these medications   amLODipine 10 MG tablet Commonly known as:  NORVASC Take 10 mg by mouth every morning.   aspirin EC 81 MG tablet Take 81 mg by mouth every morning.   calcium acetate 667 MG capsule Commonly known as:  PHOSLO 1,334-2,668 mg.   carvedilol 6.25 MG tablet Commonly known as:  COREG Take 1 tablet (6.25 mg total) by mouth 2 (two) times daily with a meal. What changed:  medication strength  how much to take   cloNIDine 0.1 mg/24hr patch Commonly known as:  CATAPRES - Dosed in mg/24 hr Place 1 patch (0.1 mg total) onto the skin every 3 (three) days.   furosemide 80 MG tablet Commonly known as:  LASIX Take  1 tablet (80 mg total) by mouth 2 (two) times daily.   urea 10 % lotion Apply topically 2 (two) times daily.   vancomycin IVPB Inject 1,250 mg into the vein Every Tuesday,Thursday,and Saturday with dialysis. Indication:  bacteremia Last Day of Therapy:  04/19/18 Labs - Per HD Center Start taking on:  03/30/2017            Home Infusion Instuctions        Start     Ordered   03/28/17 0000  Home infusion instructions [facility] May follow [place] Dosing Protocol; May administer Cathflo as needed to maintain patency of vascular access device.; Flushing of vascular access device: per [facility] Protocol: 0.9% NaCl pre/post medica...    Question Answer Comment  Instructions May follow [place] Dosing Protocol   Instructions May administer Cathflo as needed to maintain patency of vascular access device.   Instructions Flushing of vascular access device: per [facility] Protocol: 0.9% NaCl pre/post medication administration and prn patency; Heparin 100 u/ml, 40m for implanted ports and Heparin 10u/ml, 551mfor all other central venous catheters.   Instructions May follow [facility] Anaphylaxis Protocol for First Dose Administration in the home: 0.9% NaCl at 25-50 ml/hr to maintain IV access for protocol meds. Epinephrine 0.3 ml IV/IM PRN and Benadryl 25-50 IV/IM PRN s/s of anaphylaxis.   Instructions [facility] Infusion Coordinator (RN) to assist per patient IV care needs in the home PRN.      03/28/17 1659      Discharge Instructions: Please refer to Patient Instructions section of EMR for full details.  Patient was counseled important signs and symptoms that should prompt return to medical care, changes in medications, dietary instructions, activity restrictions, and follow up appointments.   Follow-Up Appointments: Follow-up Information    IaGeorges LynchMD Follow up on 03/30/2017.   Specialty:  Family Medicine Why:  9:30 AM. Please arrive 15 minutes early for your hospital follow up appointment. Contact information: [id number] Follow up.   Specialty:  Dentistry Why:  Patient is scheduled for surgery in the operating room with general anesthesia on Friday, 04/07/2017 at 930 [facility].m. [facility]. Presurgical testing will contact the patient and schedule preoperative visit as indicated.  Contact information: [phone]        [facility] Follow up on 04/05/2017.   Specialty:  Cardiology Why:  Appointment to pick up cardiac event monitor on 04/05/2017 at 11:30AM. [place] Contact  information: [email or web address]        LaEverrett CoombeMD 03/29/2017, 10:56 PM PGY-1, [place]

## 2017-03-28 NOTE — Progress Notes (Signed)
Family Medicine Teaching Service Daily Progress Note Intern Pager: [phone]  Patient name: [name] Medical record number: [id number] Date of birth: [birth year] Age: 66 y.o. Gender: male  Primary Care Provider: Georges Lynch, MD Consultants: Nephrology, Cardiology Code Status: FULL  Pt Overview and Major Events to Date:  4/10- Admited to [place] 4/11- Rothia grew in 1/2 blood cultures  Assessment and Plan: Patrick Wenzler Clarkis a 66 y.o. is here for sepsis, presenting with fever and weakness and found to have Rothia in 1/2 blood samples. Questionable diagnosis of new Afib on ED EKG, however has been NSR on telemetry. PMH is significant for diastolic CHF (ECHO with LVEF 55-60%, mild LVH, G2DD), ESRD on Dialysis, MGUS, DM, HTN, HLD, Asthma, morbid obesity, lymphedema, and venous insufficiency  Rothia Bacteremia:Suspected dental source. Blood Cx 4/10: GPC in pairs and chains on gram stain, with rothia species in 1/2 bottles. Not thought to be a contaminant by ID.Blood cx 4/11: NGTD. TTE no vegetations, TEE unsuccessful  - Appreciate dental recs: plan for dental extraction of remaining teeth as inpt or outpt - Appreciate ID recs: Per note 4/14, will likely need Vanc x 4 weeks since unable to complete TEE. Will arrange ID f/u in 6w.  - Zosyn 4/10-4/14 - Vancomycin every other day since 4/10 >>  New Afib, now NSR: noted in EKG obtained in ED. Now NSR. Held off anticoag in setting of ttp. Intermittently bradycardic to 40s (asymptomatic) with coreg 25 mg BID, 41 this AM, held AM dose. Will f/u cardiology recs. - TEE pending - cards consult, appreciate recommendations - consider coreg dose - decreased dose vs discontinue for brady - anticoagulation now that thrombocytopenia is resolved  Thrombocytopenia/Anemia, improved: Plt improved at 177 from 25 on admission. Hgb stable at 9.5.  - heme-onc consult: likely reactive due to acute illness. SPEP is unchanged from prior. - stable to cont ASA,  prophylactic lovenox  - ?chronic anticoagulation appropriate with afib on admission? Will ask cards  ESRD on HD:  - HD Tu/Th/Sa - nephro on board for HD  DM2, diet controlled: Diet controlled. Glucose stable on BMP - monitor on BMP, no sliding scale  Asthma: no sign of exacerbation - PRN duonebs  HTN - Takes norvasc 10 mg QD, Coreg 25 mg BID, Lasix at home - Coreg 25 BID >> consider decreasing - Lasix   Hx of CHF, stable - Continue Lasix - Coreg 25 BID  HLD , stable. Statin recommended by PCP at last outpatient visit - patient refused statin - can continue to counsel  Morbid Obesity - weight loss counseling  Lymphedema: Chronic significant skin changes. - urea lotion   MGUS with smoldering multiple myeloma: Last seen by oncology in 06/2014 and patient was lost to follow up per chart review. SPEP unchanged from prior. Seen by oncology during hospitalization   Protein Calorie Malnutrition, Mild: Albumin 2.7 - seen by nutrition  Disposition: ?Home - will order PT consult  Subjective:  Patient seen and examined at bedside in dialysis. No complaints this AM. Bradycardia but asymptomatic. Endorses taking coreg 25 mg BID at home, skips on AMs of dialysis.  Objective: Temp:  [97.8 F (36.6 C)-98.1 F (36.7 C)] 97.8 F (36.6 C) (04/17 0542) Pulse Rate:  [45-94] 81 (04/17 0542) Resp:  [14-18] 18 (04/17 0542) BP: (132-187)/(57-80) 170/64 (04/17 0542) SpO2:  [94 %-100 %] 100 % (04/17 0542) Weight:  [273 lb (123.8 kg)] 273 lb (123.8 kg) (04/17 0542) Physical Exam: General: NAD, rests comfortably in bed playing  chess on his phone Cardiovascular: regular but bradycardic, no M/R/G Respiratory: CTA bil, no W/R/R Abdomen: soft and nontender, nondistended Extremities: +LE chronic skin changes, compression socks in place  Laboratory: Cholesterol 184, HDL 29, LDL 115, Triglyceride 201 HIT panel negative Periph smear unremarkable TSH Hep B neg Folate 271 B12  266 LDH 154 Haptoglobin 162 Retic 1.3 Initial DIC panel with elevated fibrinogen, D-dimer, PTT   Recent Labs Lab 03/26/17 0526 03/27/17 0502 03/28/17 0446  WBC 7.7 7.6 8.3  HGB 9.4* 9.1* 9.5*  HCT 29.6* 28.8* 29.2*  PLT 94* 137* 177    Recent Labs Lab 03/21/17 1450  03/24/17 0446  03/26/17 0526 03/27/17 0502 03/28/17 0446  NA 138  < > 133*  < > 135 134* 132*  K 4.1  < > 4.3  < > 3.9 3.8 4.2  CL 99*  < > 95*  < > 97* 95* 95*  CO2 26  < > 29  < > '30 28 25  ' BUN 37*  < > 33*  < > 29* 41* 54*  CREATININE 10.82*  < > 8.10*  < > 7.85* 9.80* 11.51*  CALCIUM 8.5*  < > 9.1  < > 9.4 9.7 9.4  PROT 7.0  --  7.2  --   --   --   --   BILITOT 1.1  --  0.7  --   --   --   --   ALKPHOS 57  --  56  --   --   --   --   ALT 15*  --  18  --   --   --   --   AST 29  --  22  --   --   --   --   GLUCOSE 123*  < > 106*  < > 168* 142* 124*  < > = values in this interval not displayed.  Imaging/Diagnostic Tests: Orthopantogram with caries within right-sided supererupted maxillary molar teeth with 1-2 remaining root tips in the region of the premolars on the right as possible infectious source.  Study Conclusions Echo 4/13 - Left ventricle: The cavity size was normal. There was mild   concentric hypertrophy. Systolic function was normal. The   estimated ejection fraction was in the range of 55% to 60%.   Although no diagnostic regional wall motion abnormality was   identified, this possibility cannot be completely excluded on the   basis of this study. Features are consistent with a pseudonormal   left ventricular filling pattern, with concomitant abnormal   relaxation and increased filling pressure (grade 2 diastolic   dysfunction).  CT neck 4/13 IMPRESSION: 1. No discrete mass or collection of the neck identified. 2. Left supraclavicular and right pericardial lymphadenopathy. This may be due to systemic infection, lymphoproliferative disease, or possibly metastatic disease of  uncertain primary. 3. Advanced cervical spine degenerative changes with finding of renal spondyloarthropathy.  RUQ U/S 4/13 IMPRESSION: Cholelithiasis without acute cholecystitis or biliary duct dilatation. Mildly limited exam secondary to patient body habitus.  CXR 4/10 IMPRESSION: Mild interstitial prominence bilateral lower lobes may represent mild interstitial edema scarring or pneumonitis. No segmental consolidation.   Everrett Coombe, MD 03/28/2017, 9:52 AM PGY-1, [place] Intern pager: [phone], text pages welcome

## 2017-03-28 NOTE — Progress Notes (Signed)
Progress Note  Patient Name: [name] Date of Encounter: 03/28/2017  Primary Cardiologist: Dr. Percival Spanish  Subjective   No chest pain or palpitations. Breathing at baseline.   Inpatient Medications    Scheduled Meds: . aspirin EC  81 mg Oral q morning - 10a  . calcitRIOL      . calcitRIOL      . calcitRIOL  1.75 mcg Oral Q T,Th,Sa-HD  . calcium acetate  2,001 mg Oral TID WC  . carvedilol  6.25 mg Oral BID WC  . cloNIDine  0.1 mg Transdermal Q3 days  . darbepoetin (ARANESP) injection - DIALYSIS  60 mcg Intravenous Q Thu-HD  . dextromethorphan-guaiFENesin  1 tablet Oral BID  . feeding supplement (NEPRO CARB STEADY)  237 mL Oral BID BM  . feeding supplement (PRO-STAT SUGAR FREE 64)  30 mL Oral Q1500  . furosemide  80 mg Oral BID  . heparin  5,000 Units Subcutaneous Q8H  . multivitamin  1 tablet Oral QHS  . sodium chloride flush  3 mL Intravenous Q12H  . urea   Topical BID   Continuous Infusions: . sodium chloride Stopped (03/27/17 1008)  . [START ON 03/30/2017] vancomycin     PRN Meds: sodium chloride, acetaminophen **OR** acetaminophen, ipratropium-albuterol, ondansetron **OR** ondansetron (ZOFRAN) IV, polyethylene glycol, sodium chloride flush   Vital Signs    Vitals:   03/28/17 1145 03/28/17 1200 03/28/17 1230 03/28/17 1250  BP: (!) 187/93 (!) 175/75 (!) 187/73 (!) 175/73  Pulse: (!) 51 (!) 48 (!) 48 (!) 54  Resp:    14  Temp:    97.8 F (36.6 C)  TempSrc:    Oral  SpO2:    100%  Weight:    269 lb 6.4 oz (122.2 kg)  Height:        Intake/Output Summary (Last 24 hours) at 03/28/17 1432 Last data filed at 03/28/17 1250  Gross per 24 hour  Intake                3 ml  Output             1700 ml  Net            -1697 ml   Filed Weights   03/28/17 0542 03/28/17 0845 03/28/17 1250  Weight: 273 lb (123.8 kg) 273 lb 5.9 oz (124 kg) 269 lb 6.4 oz (122.2 kg)    Telemetry    Sinus bradycardia, HR in mid-40's to 60's. - Personally Reviewed  ECG    No  new tracings.   Physical Exam   General: Well developed, obese African American male appearing in no acute distress. Head: Normocephalic, atraumatic.  Neck: Supple without bruits, JVD at 8cm. Lungs:  Resp regular and unlabored, CTA without wheezing or rales. Heart: Regular rhythm, bradycardiac rate, S1, S2, no S3, S4, or murmur; no rub. Abdomen: Soft, non-tender, non-distended with normoactive bowel sounds. No hepatomegaly. No rebound/guarding. No obvious abdominal masses. Extremities: No clubbing or cyanosis, trace lower extremity edema. Distal pedal pulses are 2+ bilaterally. Neuro: Alert and oriented X 3. Moves all extremities spontaneously. Psych: Normal affect.  Labs    Chemistry Recent Labs Lab 03/21/17 1450  03/24/17 0446  03/26/17 0526 03/27/17 0502 03/28/17 0446  NA 138  < > 133*  < > 135 134* 132*  K 4.1  < > 4.3  < > 3.9 3.8 4.2  CL 99*  < > 95*  < > 97* 95* 95*  CO2 26  < >  29  < > 30 28 25   GLUCOSE 123*  < > 106*  < > 168* 142* 124*  BUN 37*  < > 33*  < > 29* 41* 54*  CREATININE 10.82*  < > 8.10*  < > 7.85* 9.80* 11.51*  CALCIUM 8.5*  < > 9.1  < > 9.4 9.7 9.4  PROT 7.0  --  7.2  --   --   --   --   ALBUMIN 2.9*  --  2.6*  < > 2.6* 2.8* 3.0*  AST 29  --  22  --   --   --   --   ALT 15*  --  18  --   --   --   --   ALKPHOS 57  --  56  --   --   --   --   BILITOT 1.1  --  0.7  --   --   --   --   GFRNONAA 4*  < > 6*  < > 6* 5* 4*  GFRAA 5*  < > 7*  < > 7* 6* 5*  ANIONGAP 13  < > 9  < > 8 11 12   < > = values in this interval not displayed.   Hematology Recent Labs Lab 03/26/17 0526 03/27/17 0502 03/28/17 0446  WBC 7.7 7.6 8.3  RBC 3.12* 3.05* 3.07*  HGB 9.4* 9.1* 9.5*  HCT 29.6* 28.8* 29.2*  MCV 94.9 94.4 95.1  MCH 30.1 29.8 30.9  MCHC 31.8 31.6 32.5  RDW 16.8* 16.9* 17.5*  PLT 94* 137* 177    Cardiac Enzymes Recent Labs Lab 03/21/17 2247 03/22/17 0640 03/22/17 1207 03/22/17 1830  TROPONINI 0.45* 0.46* 0.42* 0.34*   No results for input(s):  TROPIPOC in the last 168 hours.   BNPNo results for input(s): BNP, PROBNP in the last 168 hours.   DDimer  Recent Labs Lab 03/22/17 9622  DDIMER 3.91*     Radiology    No results found.  Cardiac Studies   Echocardiogram: 03/24/2017 Study Conclusions  - Left ventricle: The cavity size was normal. There was mild   concentric hypertrophy. Systolic function was normal. The   estimated ejection fraction was in the range of 55% to 60%.   Although no diagnostic regional wall motion abnormality was   identified, this possibility cannot be completely excluded on the   basis of this study. Features are consistent with a pseudonormal   left ventricular filling pattern, with concomitant abnormal   relaxation and increased filling pressure (grade 2 diastolic   dysfunction).  Patient Profile     66 y.o. male w/ PMH of ESRD (on HD), chronic diastolic CHF, HTN, HLD, and Type 2 DM who presented to [facility] on 03/21/2017 for tremors, fevers, and generalized fatigue found to have Rothia bacteremia. Cardiology was initially consulted on 03/22/2017 as he was found to be in new-onset atrial fibrillation and had elevated troponin values peaking at 0.46. Called back to see on 4/17 in regards to anticoagulation.   Assessment & Plan    1. New-onset atrial fibrillation - noted on admission while in the ER. Converted back to NSR the following day. Is maintaining SR on telemetry (bradycardiac in the 40's - 50's). Coreg just reduced from 25mg  BID to 6.25mg  BID as below.  - This patients CHA2DS2-VASc Score and unadjusted Ischemic Stroke Rate (% per year) is equal to 4.8 % stroke rate/year from a score of 4 (HTN, DM, Age, Vascular). Was not  started on anticoagulation at the time of initial consult as he was thrombocytopenic with a platelet count of 25K. Platelet count now improved to 177K but he has chronic anemia (Hgb at 9.5 today).  - he has no known history of atrial fibrillation but was asymptomatic with  this on admission. Could consider a 30-day event monitor as an outpatient to assess for recurrence of atrial fibrillation as his one episode occurred in the setting of sepsis and bacteremia. If recurrence noted, would then need to initiate anticoagulation with Coumadin.   2. Chronic Diastolic CHF - EF 61-95% with Grade 2 DD.  - he does not appear volume overloaded by physical examination.  - volume status managed by Nephrology.   3. Elevated Troponin - values peaked at 0.46 with a flat-trend. Thought to be secondary to ESRD. - echo shows a preserved EF of 55-60% with no WMA. No further plans for ischemic evaluation at this time.  4. Sinus Bradycardia - HR in mid-40's to 50's. Currently on Coreg 6.25mg  BID (at 25mg  BID prior to admission). Just reduced from 25mg  BID to 6.25mg  this AM. May need to further reduce to 3.125mg  BID pending HR response.   5. HTN - BP at 132/64 - 204/138 in the past 24 hours. At 125/93 on most recent check.  - on Coreg 25mg  BID and Amlodipine 10mg  daily PTA. - on reduced Coreg dosing secondary to bradycardia. Started on Clonidine patch by admitting team. If unable to tolerate this, would recommend restarting Amlodipine as he was on this PTA and tolerating it well.   6. Rothia Bacteremia - Infectious Disease following.  - TTE without evidence of vegetations. TEE unable to be performed due to poor dentition and bleeding from gums. Dental consult obtained and planning for extraction of remaining teeth later this week.   Signed, Erma Heritage , PA-C 2:32 PM 03/28/2017 Pager: [phone]  I have seen and examined the patient along with Erma Heritage , PA-C.  I have reviewed the chart, notes and new data.  I agree with PA's note.  Key new complaints: feeling  Key examination changes: RRR Key new findings / data: TTE with only mild abnormalities and upper limit of normal LA diameter. Likelihood of AF recurrence uncertain.  PLAN: It is possible that the  AF was transient, related to sepsis. Challenges with anticoagulation are substantial due to renal disease. Only appropriate anticoagulant would be warfarin. Risk of bleeding is higher than average. I would DC home without oral anticoagulants, plan a 30-day event monitor and follow up with Dr. Percival Spanish.  Sanda Klein, MD, [place] [phone] 03/28/2017, 4:14 PM

## 2017-03-29 DIAGNOSIS — A4150 Gram-negative sepsis, unspecified: Secondary | ICD-10-CM | POA: Insufficient documentation

## 2017-03-30 ENCOUNTER — Telehealth: Payer: Self-pay | Admitting: Internal Medicine

## 2017-03-30 ENCOUNTER — Other Ambulatory Visit (HOSPITAL_COMMUNITY): Payer: Self-pay | Admitting: Dentistry

## 2017-03-30 NOTE — Telephone Encounter (Signed)
Called patient and informed patient that Clonidine patch is to be replaced every 7 days instead of every 3 days as mentioned on the prescription. Patient understood. Patient has hospital follow up tomorrow with Dr. Alease Frame.

## 2017-03-31 ENCOUNTER — Ambulatory Visit: Payer: Medicare Other | Admitting: Family Medicine

## 2017-03-31 ENCOUNTER — Encounter: Payer: Self-pay | Admitting: Family Medicine

## 2017-03-31 ENCOUNTER — Ambulatory Visit (INDEPENDENT_AMBULATORY_CARE_PROVIDER_SITE_OTHER): Payer: Medicare Other | Admitting: Family Medicine

## 2017-03-31 VITALS — BP 158/74 | HR 62 | Temp 98.7°F | Wt 272.0 lb

## 2017-03-31 DIAGNOSIS — R7881 Bacteremia: Secondary | ICD-10-CM

## 2017-03-31 DIAGNOSIS — I10 Essential (primary) hypertension: Secondary | ICD-10-CM | POA: Diagnosis not present

## 2017-03-31 DIAGNOSIS — R197 Diarrhea, unspecified: Secondary | ICD-10-CM | POA: Diagnosis not present

## 2017-03-31 MED ORDER — CLONIDINE HCL 0.1 MG/24HR TD PTWK: 0.1000 mg | MEDICATED_PATCH | TRANSDERMAL | 5 refills | Status: DC

## 2017-03-31 NOTE — Assessment & Plan Note (Signed)
No setbacks at this time. - Informed patient of change to clonidine patch from 1 patch every 3 days, to 1 patch weekly.

## 2017-03-31 NOTE — Assessment & Plan Note (Signed)
Continue current regimen with vancomycin during HD sessions. Follow-up in 1-2 months.

## 2017-03-31 NOTE — Progress Notes (Signed)
   HPI  CC: Hospital follow-up Patient is here for hospital follow-up. He states that he feels well overall and has had no setbacks regarding his hemodialysis or administration of IV vancomycin. He denies any fevers, headaches, new fatigue, vision changes, shortness of breath, or chest pain.  Patient endorses persistent watery diarrhea. He states that this had continued throughout his stay in the hospital and it had been addressed prior to that admission as well. Previous C. difficile testing was negative. Patient continues to have numerous loose/watery bowel movements every day. Denies hematochezia or melena. Denies nausea or vomiting. Denies abdominal pain. Patient has had some bowel urgency.  Review of Systems See HPI for ROS.   CC, SH/smoking status, and VS noted  Objective: BP (!) 158/74   Pulse 62   Temp 98.7 F (37.1 C) (Oral)   Wt 272 lb (123.4 kg)   SpO2 99%   BMI 37.94 kg/m  Gen: NAD, alert, cooperative, and pleasant. HEENT: NCAT, EOMI, PERRL, MMM CV: RRR, no murmur Resp: CTAB, no wheezes, non-labored Abd: SNTND, BS hyperactive, no guarding or organomegaly Ext: No edema, warm, dialysis fistula noted Neuro: Alert and oriented, Speech clear, No gross deficits   Assessment and plan:  Diarrhea Patient continues to have issues with diarrhea. No improvement since discharge. No hematochezia or melena. Previous C. difficile testing was negative. Due to patient's complicated medical history, not to mention need for a colonoscopy later this year, I have referred patient to gastroenterology for evaluation.  Bacteremia Continue current regimen with vancomycin during HD sessions. Follow-up in 1-2 months.  HYPERTENSION, BENIGN SYSTEMIC No setbacks at this time. - Informed patient of change to clonidine patch from 1 patch every 3 days, to 1 patch weekly.   Orders Placed This Encounter  Procedures  . Ambulatory referral to Gastroenterology    Referral Priority:   Routine   Referral Type:   Consultation    Referral Reason:   Specialty Services Required    Number of Visits Requested:   1    Meds ordered this encounter  Medications  . cloNIDine (CATAPRES - DOSED IN MG/24 HR) 0.1 mg/24hr patch    Sig: Place 1 patch (0.1 mg total) onto the skin once a week.    Dispense:  4 patch    Refill:  5     [name], MD,MS,  PGY3 03/31/2017 12:33 PM

## 2017-03-31 NOTE — Assessment & Plan Note (Signed)
Patient continues to have issues with diarrhea. No improvement since discharge. No hematochezia or melena. Previous C. difficile testing was negative. Due to patient's complicated medical history, not to mention need for a colonoscopy later this year, I have referred patient to gastroenterology for evaluation.

## 2017-04-03 ENCOUNTER — Encounter: Payer: Self-pay | Admitting: Gastroenterology

## 2017-04-04 NOTE — Pre-Procedure Instructions (Signed)
[name]  04/04/2017      [id number], [place] [address] Phone: [phone] Fax: [phone]    Your procedure is scheduled on Fri, April 27 @ 9:30 AM  Report to [facility] Admitting at 7:30 AM  Call this number if you have problems the morning of surgery:  [phone]   Remember:  Do not eat food or drink liquids after midnight.  Take these medicines the morning of surgery with A SIP OF WATER Amlodipine(Norvasc),Carvedilol(Coreg),and Pantoprazole(Prilosec)             No Goody's,BC's,Aleve,Aspirin,Ibuprofen,Motrin,Fish Oil,or any Herbal Medication.    Do not wear jewelry.  Do not wear lotions, powders,colognes, or deoderant.             Men may shave face and neck.  Do not bring valuables to the hospital.  [facility] is not responsible for any belongings or valuables.  Contacts, dentures or bridgework may not be worn into surgery.  Leave your suitcase in the car.  After surgery it may be brought to your room.  For patients admitted to the hospital, discharge time will be determined by your treatment team.  Patients discharged the day of surgery will not be allowed to drive home.   [facility]  Before surgery, you can play an important role.  Because skin is not sterile, your skin needs to be as free of germs as possible.  You can reduce the number of germs on you skin by washing with CHG (chlorahexidine gluconate) soap before surgery.  CHG is an antiseptic cleaner which kills germs and bonds with the skin to continue killing germs even after washing.  Please DO NOT use if you have an allergy to CHG or antibacterial soaps.  If your skin becomes reddened/irritated stop using the CHG and inform your nurse when you arrive at Short Stay.  Do not shave (including legs and underarms) for at least 48 hours prior to the first CHG shower.   You may shave your face.  Please follow these instructions carefully:   1.  Shower with CHG Soap the night before surgery and the                                morning of Surgery.  2.  If you choose to wash your hair, wash your hair first as usual with your       normal shampoo.  3.  After you shampoo, rinse your hair and body thoroughly to remove the                      Shampoo.  4.  Use CHG as you would any other liquid soap.  You can apply chg directly       to the skin and wash gently with scrungie or a clean washcloth.  5.  Apply the CHG Soap to your body ONLY FROM THE NECK DOWN.        Do not use on open wounds or open sores.  Avoid contact with your eyes,       ears, mouth and genitals (private parts).  Wash genitals (private parts)       with your normal soap.  6.  Wash thoroughly, paying special attention to the area where your surgery  will be performed.  7.  Thoroughly rinse your body with warm water from the neck down.  8.  DO NOT shower/wash with your normal soap after using and rinsing off       the CHG Soap.  9.  Pat yourself dry with a clean towel.            10.  Wear clean pajamas.            11.  Place clean sheets on your bed the night of your first shower and do not        sleep with pets.  Day of Surgery  Do not apply any lotions/deoderants the morning of surgery.  Please wear clean clothes to the hospital/surgery center.     Please read over the following fact sheets that you were given. Coughing and Deep Breathing

## 2017-04-04 NOTE — Progress Notes (Addendum)
Anesthesia Chart Review: Patient is a 66 year old male scheduled for multiple teeth extractions with alveoloplasty on 04/07/17 by Dr. Enrique Sack.  History includes former smoker (quit '80), DM2, HTN, HLD, ESRD (HD TTS; LUE AVF), PVD s/p right P-PT [facility] '06 and bilateral 5th toe amputations (left, 06/24/11), chronic cystitis, lymphedema, CHF, MGUS, hiatal hernia s/p surgery, umbilical hernia repair. BMI is consistent with obesity. OSA screening score was 5.  He was hospitalized 03/21/17-03/28/17 for SIRS, Rothia bacteremia (1 or 2 samples), thrombocytopenia (PLT 25K)/pancytopenia and question of new onset afib.  - Pancytopenia thought likely due to acute infection/stress. HIT panel negative. Peripheral smear "unremarkable". PLT count improving by discharge, so no further work-up recommended. He was also seen by [facility] Dr. Alen Blew. MGUS was felt unchanged over the last 10 years. No further hematology work-up needed, but will likely need a repeat serum protein electrophoresis in 2019. - Possible afib in the ED, but noted to be in SR once placed on telemetry. Cardiology did not recommend anticoagulation at that time due to thrombocytopenia. TSH was normal. Plan was for an out-patient 30 day event monitor to better assess for arrhythmias. If afib recurrence then would recommended initiate anticoagulation with warfarin. Of note troponin was elevated at 0.46 with flat trend. Cardiologist Dr. Sallyanne Kuster felt secondary to ESRD. Echo showed preserved EF with no WMA, so no further ischemic evaluation recommended at that time.  - Rothia bacteremia in one of two bottles. ID did not think this was a contaminant. CT of the neck without mass or fluid collection but left supraclavicular and right pericardial lymphadenopathy present. Orthopantogram showed caries within right-sided supererupted maxillary molar teeth with 1-2 remaining root tips in the region of the premolars on the right as possible infectious source. TTE did not show  evidence of vegetations. ATEE was ordered tor/o endocarditis however it was unsuccessful after multiple attempts to place the probe without success. He received IV vancomycin. He was referred to Dr. Enrique Sack for teeth extraction as out-patient.  PCP is Dr. Georges Lynch [facility]). Primary cardiologist is Dr. Minus Breeding.  Meds include amlodipine, aspirin 81 mg, PhosLo, Coreg, clonidine patch, Lasix, Protonix. Vancomycin at hemodialysis.  BP (!) 179/73   Pulse 60   Temp 36.4 C   Resp 18   Ht 5\' 11"  (1.803 m)   Wt 274 lb 11.2 oz (124.6 kg)   SpO2 99%   BMI 38.31 kg/m   EKG 03/22/17: SB at 55 bpm, non-specific T wave abnormality.   TEE 03/27/17: Aborted. Per Dr. Tressia Miners Turner's note, "Multiple attempts were made at placing the probe without success.  The patient has very poor dentition with very large tongue.  He developed some bleeding from his gums with a loose tooth and after discussion with anesthesia the procedure was aborted."  Echo (TTE) 03/24/17: Study Conclusions - Left ventricle: The cavity size was normal. There was mild   concentric hypertrophy. Systolic function was normal. The   estimated ejection fraction was in the range of 55% to 60%.   Although no diagnostic regional wall motion abnormality was   identified, this possibility cannot be completely excluded on the   basis of this study. Features are consistent with a pseudonormal   left ventricular filling pattern, with concomitant abnormal   relaxation and increased filling pressure (grade 2 diastolic   dysfunction).  Nuclear stress test 01/01/14: Study Conclusions Overall Impression:  Low risk stress nuclear study Fixed defect in the inferior wall most consistent with diaphragmatic attenuation. LV Ejection Fraction:  51%.  LV Wall Motion:  Low normal LVF  He had a 30 day event monitor placed on 04/05/17.  Carotid U/S 02/04/15: Impressions: Heterogeneous plaque, bilaterally. Bilateral 1-39%. Patent vertebral arteries  with antegrade flow. Normal subclavian arteries, bilaterally.   CXR 03/21/17: IMPRESSION: Mild interstitial prominence bilateral lower lobes may represent mild interstitial edema scarring or pneumonitis. No segmental consolidation.  Labs from 03/28/17 noted. H/H 9.5/29.2. PLT 177. Glucose 124. A1c 6.8 03/13/17. He will need an ISTAT4 on the day of surgery.  At PAT, he was without acute changes since hospital discharge. If no new issues and ISTAT results acceptable then I would anticipate that he can proceed as scheduled.  [facility] Short Stay Center/Anesthesiology Phone [phone] 04/05/2017 3:13 PM

## 2017-04-05 ENCOUNTER — Ambulatory Visit (INDEPENDENT_AMBULATORY_CARE_PROVIDER_SITE_OTHER): Payer: Medicare Other

## 2017-04-05 ENCOUNTER — Encounter (HOSPITAL_COMMUNITY): Payer: Self-pay

## 2017-04-05 ENCOUNTER — Encounter (HOSPITAL_COMMUNITY)
Admission: RE | Admit: 2017-04-05 | Discharge: 2017-04-05 | Disposition: A | Discharge status: Home / Self Care | Payer: Medicare Other | Source: Ambulatory Visit | Attending: Dentistry | Admitting: Dentistry

## 2017-04-05 DIAGNOSIS — I38 Endocarditis, valve unspecified: Secondary | ICD-10-CM | POA: Diagnosis not present

## 2017-04-05 DIAGNOSIS — I10 Essential (primary) hypertension: Secondary | ICD-10-CM | POA: Diagnosis not present

## 2017-04-05 DIAGNOSIS — M264 Malocclusion, unspecified: Secondary | ICD-10-CM | POA: Diagnosis not present

## 2017-04-05 DIAGNOSIS — I480 Paroxysmal atrial fibrillation: Secondary | ICD-10-CM

## 2017-04-05 DIAGNOSIS — K045 Chronic apical periodontitis: Secondary | ICD-10-CM | POA: Diagnosis not present

## 2017-04-05 DIAGNOSIS — Z79899 Other long term (current) drug therapy: Secondary | ICD-10-CM | POA: Diagnosis not present

## 2017-04-05 DIAGNOSIS — K029 Dental caries, unspecified: Secondary | ICD-10-CM | POA: Diagnosis not present

## 2017-04-05 HISTORY — DX: Cardiac arrhythmia, unspecified: I49.9

## 2017-04-05 HISTORY — DX: Gastro-esophageal reflux disease without esophagitis: K21.9

## 2017-04-05 LAB — GLUCOSE, CAPILLARY: GLUCOSE-CAPILLARY: 97 mg/dL (ref 65–99)

## 2017-04-05 NOTE — Progress Notes (Signed)
PCP is Dr. Marylynn Pearson. [place]  Cardiologist is Dr. Lossie Faes on [place]. Thurs and Sat.  Has  On a heart monitor that was placed today states he will wear it for 7 days a week at a time for a total of 30 days. Denies any chest pain. Reports his fasting cbg's run 114-120

## 2017-04-05 NOTE — Progress Notes (Signed)
Not here for PAT appointment message left for [name] to call us back.

## 2017-04-05 NOTE — Progress Notes (Signed)
   04/05/17 1521  OBSTRUCTIVE SLEEP APNEA  Have you ever been diagnosed with sleep apnea through a sleep study? No  Do you snore loudly (loud enough to be heard through closed doors)?  0  Do you often feel tired, fatigued, or sleepy during the daytime (such as falling asleep during driving or talking to someone)? 0  Has anyone observed you stop breathing during your sleep? 0  Do you have, or are you being treated for high blood pressure? 1  BMI more than 35 kg/m2? 1  Age > 50 (1-yes) 1  Neck circumference greater than:Male 16 inches or larger, Male 17inches or larger? 1  Male Gender (Yes=1) 1  Obstructive Sleep Apnea Score 5  Score 5 or greater  Results sent to PCP

## 2017-04-07 ENCOUNTER — Encounter (HOSPITAL_COMMUNITY)
Admission: RE | Disposition: A | Discharge status: Home / Self Care | Payer: Self-pay | Source: Ambulatory Visit | Attending: Dentistry

## 2017-04-07 ENCOUNTER — Ambulatory Visit (HOSPITAL_COMMUNITY): Payer: Medicare Other | Admitting: Vascular Surgery

## 2017-04-07 ENCOUNTER — Encounter (HOSPITAL_COMMUNITY): Payer: Self-pay | Admitting: *Deleted

## 2017-04-07 ENCOUNTER — Encounter (HOSPITAL_COMMUNITY): Payer: Self-pay | Admitting: Emergency Medicine

## 2017-04-07 ENCOUNTER — Emergency Department (HOSPITAL_COMMUNITY)
Admission: EM | Admit: 2017-04-07 | Discharge: 2017-04-08 | Disposition: A | Discharge status: Home / Self Care | Payer: Medicare Other | Source: Home / Self Care | Attending: Emergency Medicine | Admitting: Emergency Medicine

## 2017-04-07 ENCOUNTER — Ambulatory Visit (HOSPITAL_COMMUNITY)
Admission: RE | Admit: 2017-04-07 | Discharge: 2017-04-07 | Disposition: A | Discharge status: Home / Self Care | Payer: Medicare Other | Source: Ambulatory Visit | Attending: Dentistry | Admitting: Dentistry

## 2017-04-07 ENCOUNTER — Ambulatory Visit (HOSPITAL_COMMUNITY): Payer: Medicare Other | Admitting: Certified Registered"

## 2017-04-07 DIAGNOSIS — Z87891 Personal history of nicotine dependence: Secondary | ICD-10-CM | POA: Insufficient documentation

## 2017-04-07 DIAGNOSIS — K0530 Chronic periodontitis, unspecified: Secondary | ICD-10-CM

## 2017-04-07 DIAGNOSIS — Z7982 Long term (current) use of aspirin: Secondary | ICD-10-CM | POA: Insufficient documentation

## 2017-04-07 DIAGNOSIS — I2510 Atherosclerotic heart disease of native coronary artery without angina pectoris: Secondary | ICD-10-CM

## 2017-04-07 DIAGNOSIS — M264 Malocclusion, unspecified: Secondary | ICD-10-CM

## 2017-04-07 DIAGNOSIS — Z79899 Other long term (current) drug therapy: Secondary | ICD-10-CM | POA: Insufficient documentation

## 2017-04-07 DIAGNOSIS — E113599 Type 2 diabetes mellitus with proliferative diabetic retinopathy without macular edema, unspecified eye: Secondary | ICD-10-CM

## 2017-04-07 DIAGNOSIS — K045 Chronic apical periodontitis: Secondary | ICD-10-CM

## 2017-04-07 DIAGNOSIS — K029 Dental caries, unspecified: Secondary | ICD-10-CM

## 2017-04-07 DIAGNOSIS — J45909 Unspecified asthma, uncomplicated: Secondary | ICD-10-CM | POA: Insufficient documentation

## 2017-04-07 DIAGNOSIS — I38 Endocarditis, valve unspecified: Secondary | ICD-10-CM | POA: Diagnosis not present

## 2017-04-07 DIAGNOSIS — E1122 Type 2 diabetes mellitus with diabetic chronic kidney disease: Secondary | ICD-10-CM

## 2017-04-07 DIAGNOSIS — I10 Essential (primary) hypertension: Secondary | ICD-10-CM | POA: Insufficient documentation

## 2017-04-07 DIAGNOSIS — K089 Disorder of teeth and supporting structures, unspecified: Secondary | ICD-10-CM

## 2017-04-07 DIAGNOSIS — I132 Hypertensive heart and chronic kidney disease with heart failure and with stage 5 chronic kidney disease, or end stage renal disease: Secondary | ICD-10-CM

## 2017-04-07 DIAGNOSIS — K083 Retained dental root: Secondary | ICD-10-CM

## 2017-04-07 DIAGNOSIS — R339 Retention of urine, unspecified: Secondary | ICD-10-CM | POA: Insufficient documentation

## 2017-04-07 DIAGNOSIS — I509 Heart failure, unspecified: Secondary | ICD-10-CM

## 2017-04-07 DIAGNOSIS — R7881 Bacteremia: Secondary | ICD-10-CM

## 2017-04-07 DIAGNOSIS — Z992 Dependence on renal dialysis: Secondary | ICD-10-CM

## 2017-04-07 DIAGNOSIS — N186 End stage renal disease: Secondary | ICD-10-CM

## 2017-04-07 HISTORY — PX: MULTIPLE EXTRACTIONS WITH ALVEOLOPLASTY: SHX5342

## 2017-04-07 LAB — BASIC METABOLIC PANEL
Anion gap: 13 (ref 5–15)
BUN: 44 mg/dL — ABNORMAL HIGH (ref 6–20)
CALCIUM: 8.9 mg/dL (ref 8.9–10.3)
CHLORIDE: 96 mmol/L — AB (ref 101–111)
CO2: 27 mmol/L (ref 22–32)
CREATININE: 9.62 mg/dL — AB (ref 0.61–1.24)
GFR calc Af Amer: 6 mL/min — ABNORMAL LOW (ref >60)
GFR calc non Af Amer: 5 mL/min — ABNORMAL LOW (ref >60)
GLUCOSE: 187 mg/dL — AB (ref 65–99)
Potassium: 4.3 mmol/L (ref 3.5–5.1)
Sodium: 136 mmol/L (ref 135–145)

## 2017-04-07 LAB — CBC
HCT: 33 % — ABNORMAL LOW (ref 39.0–52.0)
Hemoglobin: 10.4 g/dL — ABNORMAL LOW (ref 13.0–17.0)
MCH: 30 pg (ref 26.0–34.0)
MCHC: 31.5 g/dL (ref 30.0–36.0)
MCV: 95.1 fL (ref 78.0–100.0)
PLATELETS: 195 10*3/uL (ref 150–400)
RBC: 3.47 MIL/uL — ABNORMAL LOW (ref 4.22–5.81)
RDW: 17.4 % — ABNORMAL HIGH (ref 11.5–15.5)
WBC: 12.8 10*3/uL — ABNORMAL HIGH (ref 4.0–10.5)

## 2017-04-07 LAB — POCT I-STAT 4, (NA,K, GLUC, HGB,HCT)
GLUCOSE: 167 mg/dL — AB (ref 65–99)
HCT: 34 % — ABNORMAL LOW (ref 39.0–52.0)
Hemoglobin: 11.6 g/dL — ABNORMAL LOW (ref 13.0–17.0)
POTASSIUM: 4.1 mmol/L (ref 3.5–5.1)
SODIUM: 139 mmol/L (ref 135–145)

## 2017-04-07 LAB — GLUCOSE, CAPILLARY: Glucose-Capillary: 143 mg/dL — ABNORMAL HIGH (ref 65–99)

## 2017-04-07 SURGERY — MULTIPLE EXTRACTION WITH ALVEOLOPLASTY
Anesthesia: General

## 2017-04-07 MED ORDER — PHENYLEPHRINE HCL 10 MG/ML IJ SOLN
INTRAVENOUS | Status: DC | PRN
  Administered 2017-04-07: 25 ug/min via INTRAVENOUS

## 2017-04-07 MED ORDER — PROPOFOL 10 MG/ML IV BOLUS
INTRAVENOUS | Status: AC
  Filled 2017-04-07: qty 20

## 2017-04-07 MED ORDER — FENTANYL CITRATE (PF) 250 MCG/5ML IJ SOLN
INTRAMUSCULAR | Status: AC
  Filled 2017-04-07: qty 5

## 2017-04-07 MED ORDER — PROPOFOL 10 MG/ML IV BOLUS
INTRAVENOUS | Status: DC | PRN
  Administered 2017-04-07: 170 mg via INTRAVENOUS

## 2017-04-07 MED ORDER — BUPIVACAINE-EPINEPHRINE 0.5% -1:200000 IJ SOLN
INTRAMUSCULAR | Status: DC | PRN
  Administered 2017-04-07: 3.4 mL

## 2017-04-07 MED ORDER — PROMETHAZINE HCL 25 MG/ML IJ SOLN: 6.2500 mg | INTRAMUSCULAR | Status: DC | PRN

## 2017-04-07 MED ORDER — EPHEDRINE SULFATE-NACL 50-0.9 MG/10ML-% IV SOSY
PREFILLED_SYRINGE | INTRAVENOUS | Status: DC | PRN
  Administered 2017-04-07: 25 mg via INTRAVENOUS
  Administered 2017-04-07: 10 mg via INTRAVENOUS

## 2017-04-07 MED ORDER — GLYCOPYRROLATE 0.2 MG/ML IV SOSY
PREFILLED_SYRINGE | INTRAVENOUS | Status: DC | PRN
  Administered 2017-04-07: .8 mg via INTRAVENOUS
  Administered 2017-04-07: .2 mg via INTRAVENOUS

## 2017-04-07 MED ORDER — AMINOCAPROIC ACID SOLUTION 5% (50 MG/ML)
10.0000 mL | ORAL | Status: DC
Start: 2017-04-07 — End: 2017-04-07
  Administered 2017-04-07: 10 mL via ORAL
  Filled 2017-04-07: qty 100

## 2017-04-07 MED ORDER — OXYCODONE-ACETAMINOPHEN 5-325 MG PO TABS: 1.0000 | ORAL_TABLET | ORAL | Status: DC | PRN

## 2017-04-07 MED ORDER — HYDROMORPHONE HCL 1 MG/ML IJ SOLN: 0.2500 mg | INTRAMUSCULAR | Status: DC | PRN

## 2017-04-07 MED ORDER — ONDANSETRON HCL 4 MG/2ML IJ SOLN
INTRAMUSCULAR | Status: DC | PRN
  Administered 2017-04-07: 4 mg via INTRAVENOUS

## 2017-04-07 MED ORDER — MIDAZOLAM HCL 2 MG/2ML IJ SOLN
INTRAMUSCULAR | Status: AC
  Filled 2017-04-07: qty 2

## 2017-04-07 MED ORDER — SUCCINYLCHOLINE CHLORIDE 200 MG/10ML IV SOSY
PREFILLED_SYRINGE | INTRAVENOUS | Status: DC | PRN
  Administered 2017-04-07: 160 mg via INTRAVENOUS

## 2017-04-07 MED ORDER — FENTANYL CITRATE (PF) 250 MCG/5ML IJ SOLN
INTRAMUSCULAR | Status: DC | PRN
  Administered 2017-04-07 (×2): 50 ug via INTRAVENOUS
  Administered 2017-04-07: 100 ug via INTRAVENOUS
  Administered 2017-04-07: 50 ug via INTRAVENOUS

## 2017-04-07 MED ORDER — CISATRACURIUM BESYLATE (PF) 10 MG/5ML IV SOLN
INTRAVENOUS | Status: DC | PRN
  Administered 2017-04-07: 10 mg via INTRAVENOUS

## 2017-04-07 MED ORDER — SODIUM CHLORIDE 0.9 % IV SOLN: INTRAVENOUS | Status: DC

## 2017-04-07 MED ORDER — MIDAZOLAM HCL 2 MG/2ML IJ SOLN
INTRAMUSCULAR | Status: DC | PRN
  Administered 2017-04-07: 1 mg via INTRAVENOUS

## 2017-04-07 MED ORDER — OXYMETAZOLINE HCL 0.05 % NA SOLN
NASAL | Status: AC
Start: 2017-04-07 — End: ?
  Filled 2017-04-07: qty 15

## 2017-04-07 MED ORDER — BUPIVACAINE-EPINEPHRINE (PF) 0.5% -1:200000 IJ SOLN
INTRAMUSCULAR | Status: AC
  Filled 2017-04-07: qty 3.6

## 2017-04-07 MED ORDER — LIDOCAINE-EPINEPHRINE 2 %-1:100000 IJ SOLN
INTRAMUSCULAR | Status: AC
  Filled 2017-04-07: qty 10.2

## 2017-04-07 MED ORDER — HEMOSTATIC AGENTS (NO CHARGE) OPTIME
TOPICAL | Status: DC | PRN
  Administered 2017-04-07: 1 via TOPICAL

## 2017-04-07 MED ORDER — SODIUM CHLORIDE 0.9 % IV SOLN
INTRAVENOUS | Status: DC
  Administered 2017-04-07 (×3): via INTRAVENOUS

## 2017-04-07 MED ORDER — OXYCODONE-ACETAMINOPHEN 5-325 MG PO TABS: ORAL_TABLET | ORAL | 0 refills | Status: DC

## 2017-04-07 MED ORDER — OXYMETAZOLINE HCL 0.05 % NA SOLN
NASAL | Status: DC | PRN
Start: 2017-04-07 — End: 2017-04-07
  Administered 2017-04-07: 1 via TOPICAL

## 2017-04-07 MED ORDER — CISATRACURIUM BESYLATE 20 MG/10ML IV SOLN
INTRAVENOUS | Status: AC
  Filled 2017-04-07: qty 10

## 2017-04-07 MED ORDER — LIDOCAINE 2% (20 MG/ML) 5 ML SYRINGE
INTRAMUSCULAR | Status: DC | PRN
  Administered 2017-04-07: 60 mg via INTRAVENOUS

## 2017-04-07 MED ORDER — LIDOCAINE-EPINEPHRINE 2 %-1:100000 IJ SOLN
INTRAMUSCULAR | Status: DC | PRN
  Administered 2017-04-07: 10.2 mL

## 2017-04-07 MED ORDER — NEOSTIGMINE METHYLSULFATE 10 MG/10ML IV SOLN
INTRAVENOUS | Status: DC | PRN
  Administered 2017-04-07: 4 mg via INTRAVENOUS

## 2017-04-07 MED ORDER — 0.9 % SODIUM CHLORIDE (POUR BTL) OPTIME
TOPICAL | Status: DC | PRN
  Administered 2017-04-07: 1000 mL

## 2017-04-07 SURGICAL SUPPLY — 38 items
ALCOHOL 70% 16 OZ (MISCELLANEOUS) ×3 IMPLANT
ATTRACTOMAT 16X20 MAGNETIC DRP (DRAPES) ×3 IMPLANT
BLADE SURG 15 STRL LF DISP TIS (BLADE) ×2 IMPLANT
BLADE SURG 15 STRL SS (BLADE) ×6
COVER SURGICAL LIGHT HANDLE (MISCELLANEOUS) ×3 IMPLANT
GAUZE PACKING FOLDED 2  STR (GAUZE/BANDAGES/DRESSINGS) ×2
GAUZE PACKING FOLDED 2 STR (GAUZE/BANDAGES/DRESSINGS) ×1 IMPLANT
GAUZE SPONGE 4X4 12PLY STRL LF (GAUZE/BANDAGES/DRESSINGS) IMPLANT
GAUZE SPONGE 4X4 16PLY XRAY LF (GAUZE/BANDAGES/DRESSINGS) ×3 IMPLANT
GLOVE BIOGEL PI IND STRL 6 (GLOVE) ×1 IMPLANT
GLOVE BIOGEL PI INDICATOR 6 (GLOVE) ×2
GLOVE SURG ORTHO 8.0 STRL STRW (GLOVE) ×3 IMPLANT
GLOVE SURG SS PI 6.0 STRL IVOR (GLOVE) ×3 IMPLANT
GOWN STRL REUS W/ TWL LRG LVL3 (GOWN DISPOSABLE) ×1 IMPLANT
GOWN STRL REUS W/TWL 2XL LVL3 (GOWN DISPOSABLE) ×3 IMPLANT
GOWN STRL REUS W/TWL LRG LVL3 (GOWN DISPOSABLE) ×3
HEMOSTAT SURGICEL 2X14 (HEMOSTASIS) ×3 IMPLANT
KIT BASIN OR (CUSTOM PROCEDURE TRAY) ×3 IMPLANT
KIT ROOM TURNOVER OR (KITS) ×3 IMPLANT
MANIFOLD NEPTUNE WASTE (CANNULA) ×3 IMPLANT
NDL BLUNT 16X1.5 OR ONLY (NEEDLE) ×1 IMPLANT
NEEDLE BLUNT 16X1.5 OR ONLY (NEEDLE) ×3 IMPLANT
NS IRRIG 1000ML POUR BTL (IV SOLUTION) ×3 IMPLANT
PACK EENT II TURBAN DRAPE (CUSTOM PROCEDURE TRAY) ×3 IMPLANT
PAD ARMBOARD 7.5X6 YLW CONV (MISCELLANEOUS) ×5 IMPLANT
SPONGE SURGIFOAM ABS GEL 100 (HEMOSTASIS) IMPLANT
SPONGE SURGIFOAM ABS GEL 12-7 (HEMOSTASIS) IMPLANT
SPONGE SURGIFOAM ABS GEL SZ50 (HEMOSTASIS) ×2 IMPLANT
SUCTION FRAZIER HANDLE 10FR (MISCELLANEOUS) ×2
SUCTION TUBE FRAZIER 10FR DISP (MISCELLANEOUS) ×1 IMPLANT
SUT CHROMIC 3 0 PS 2 (SUTURE) ×16 IMPLANT
SUT CHROMIC 4 0 P 3 18 (SUTURE) ×2 IMPLANT
SYR 50ML SLIP (SYRINGE) ×3 IMPLANT
TOWEL OR 17X26 10 PK STRL BLUE (TOWEL DISPOSABLE) ×3 IMPLANT
TUBE CONNECTING 12'X1/4 (SUCTIONS) ×1
TUBE CONNECTING 12X1/4 (SUCTIONS) ×2 IMPLANT
WATER TABLETS ICX (MISCELLANEOUS) ×3 IMPLANT
YANKAUER SUCT BULB TIP NO VENT (SUCTIONS) ×3 IMPLANT

## 2017-04-07 NOTE — Anesthesia Procedure Notes (Addendum)
Procedure Name: Intubation Date/Time: 04/07/2017 9:50 AM Performed by: Mariea Clonts Pre-anesthesia Checklist: Patient identified, Emergency Drugs available, Suction available and Patient being monitored Patient Re-evaluated:Patient Re-evaluated prior to inductionOxygen Delivery Method: Circle System Utilized Preoxygenation: Pre-oxygenation with 100% oxygen Intubation Type: IV induction Ventilation: Mask ventilation without difficulty Laryngoscope Size: Mac and 4 Grade View: Grade III Nasal Tubes: Nasal Rae, Nasal prep performed, Left and Magill forceps- large, utilized Tube size: 7.5 mm Number of attempts: 1 Placement Confirmation: ETT inserted through vocal cords under direct vision,  positive ETCO2 and breath sounds checked- equal and bilateral Tube secured with: Tape Dental Injury: Teeth and Oropharynx as per pre-operative assessment

## 2017-04-07 NOTE — ED Provider Notes (Signed)
[facility] DEPT Provider Note   CSN: [id number] Arrival date & time: 04/07/17  2133 By signing my name below, I, [name], attest that this documentation has been prepared under the direction and in the presence of [name], MD . Electronically Signed: [name], Scribe. 04/07/2017. 11:45 PM.   History   Chief Complaint Chief Complaint  Patient presents with  . Constipation  . Urinary Retention   HPI [name] is a 66 y.o. male with a history of CHF, CKD, DM, and chronic apical periodontis who presents to the Emergency Department complaining of constant urinary retention onset this morning s/p surgery. He reports associated constipation, abdominal distention and severe suprapubic abdominal pain. No alleviating or modifying factors noted. Pt had 21 teeth surgically removed this morning and was under anesthesia. Per pt, he was moving his bowels and urinating normally before his operation this morning. Pt receives dialysis on Tuesday, Thursday and Saturday. Pt has no other acute complaints or associated symptoms at this time.   The history is provided by the patient. No language interpreter was used.   Past Medical History:  Diagnosis Date  . Arthritis   . Asthma    as a child  . CHF (congestive heart failure) ([place])   . Chronic cystitis   . Chronic kidney disease   . Diabetes mellitus   . Dry skin   . Dyspnea   . Dysrhythmia   . GERD (gastroesophageal reflux disease)   . H/O hiatal hernia    states it's been fixed  . Hyperlipidemia   . Hypertension   . Lymphedema   . Morbid obesity ([name])   . NEPHROLITHIASIS, HX OF 12/02/2009   Qualifier: Diagnosis of  By: [name], Cat    . PVD (peripheral vascular disease) ([name])   . Renal insufficiency   . UMBILICAL HERNIA 9/38/1017   Qualifier: History of  By: [name] MD, Audelia Acton    . Venous insufficiency     Patient Active Problem List   Diagnosis Date Noted  . Bacteremia   . RUQ abdominal pain   . Gross  hematuria   . Other pancytopenia ([place])   . Sepsis ([place]) 03/21/2017  . Diarrhea 03/03/2017  . Venous stasis ulcers of both lower extremities ([place]) 08/07/2015  . Varicose veins of lower extremities with complications 51/01/5851  . Malfunction of arteriovenous dialysis fistula ([place])   . CHF (congestive heart failure) ([place]) 06/23/2015  . Lymphedema distichiasis syndrome with kidney disease and diabetes mellitus ([place]) 04/06/2015  . Lymphedema of lower extremity 04/04/2015  . DM (diabetes mellitus), type 2 with renal complications ([place]) 77/82/4235  . MGUS (monoclonal gammopathy of unknown significance) 06/12/2014  . Special screening for malignant neoplasms, colon 07/09/2013  . Proliferative diabetic retinopathy ([place]) 10/23/2012  . Knee pain 08/21/2012  . Mass of thigh 08/21/2012  . ORGANIC IMPOTENCE 12/02/2009  . ONYCHOMYCOSIS 07/17/2009  . ESRD (end stage renal disease) on dialysis ([place]) 06/09/2008  . Hyperlipidemia 03/19/2007  . OBESITY, MORBID 02/08/2007  . HYPERTENSION, BENIGN SYSTEMIC 02/08/2007  . Atherosclerosis of native arteries of extremity with intermittent claudication ([place]) 02/08/2007    Past Surgical History:  Procedure Laterality Date  . AMPUTATION     Right and left fifth toes.   . AMPUTATION TOE     emoval of both little toes  . AV FISTULA PLACEMENT Left 03/21/2014   Procedure: ARTERIOVENOUS (AV) FISTULA CREATION with ultrasound;  Surgeon: [name], MD;  Location: [facility];  Service: Vascular;  Laterality:  Left;  . COLONOSCOPY    . EYE SURGERY Bilateral    cataract and lens implant  . HERNIA REPAIR    . LIGATION OF COMPETING BRANCHES OF ARTERIOVENOUS FISTULA Left 06/29/2015   Procedure: LIGATION OF LEFT ARM RADIOCEPHALIC ARTERIOVENOUS FISTULA SIDE BRANCHES;  Surgeon: Conrad Ferney, MD;  Location: [facility];  Service: Vascular;  Laterality: Left;  . Popliteal to posterior tibial bypass     2006  . R knee arthoscopic repair of meniscus    . UMBILICAL HERNIA REPAIR          Home Medications    Prior to Admission medications   Medication Sig Start Date End Date Taking? Authorizing Provider  amLODipine (NORVASC) 10 MG tablet Take 10 mg by mouth every morning.    Historical Provider, MD  aspirin EC 81 MG tablet Take 81 mg by mouth every morning.    Historical Provider, MD  calcium acetate (PHOSLO) 667 MG capsule Take 2,001-2,668 mg by mouth See admin instructions. Takes 4 capsules three times a day with meals and 3 capsules with each snacks. 07/08/15   Historical Provider, MD  carvedilol (COREG) 6.25 MG tablet Take 1 tablet (6.25 mg total) by mouth 2 (two) times daily with a meal. 03/28/17   [name], MD  cloNIDine (CATAPRES - DOSED IN MG/24 HR) 0.1 mg/24hr patch Place 1 patch (0.1 mg total) onto the skin once a week. 03/31/17   [name], MD  furosemide (LASIX) 80 MG tablet Take 1 tablet (80 mg total) by mouth 2 (two) times daily. 06/15/16   [name], MD  oxyCODONE-acetaminophen (PERCOCET) 5-325 MG tablet Take one or two tablets by mouth every 6 hours as needed for pain. 04/07/17   Lenn Cal, DDS  pantoprazole (PROTONIX) 40 MG tablet Take 40 mg by mouth daily. 03/13/17   Historical Provider, MD  urea 10 % lotion Apply topically 2 (two) times daily. Patient taking differently: Apply 1 application topically daily.  03/28/17   [name], MD  vancomycin IVPB Inject 1,250 mg into the vein Every Tuesday,Thursday,and Saturday with dialysis. Indication:  bacteremia Last Day of Therapy:  04/19/18 Labs - Per HD Center 03/30/17 04/19/17  [name], MD    Family History Family History  Problem Relation Age of Onset  . Diabetes Mother   . Heart disease Mother   . Diabetes Brother     Social History Social History  Substance Use Topics  . Smoking status: Former Smoker    Quit date: 06/06/1979  . Smokeless tobacco: Former Systems developer  . Alcohol use No     Comment:   "years ago" , none now     Allergies   Shrimp [shellfish  allergy]   Review of Systems Review of Systems 10 Systems reviewed and are negative for acute change except as noted in the HPI.   Physical Exam Updated Vital Signs BP (!) 160/67 (BP Location: Right Arm)   Pulse 70   Temp 98.9 F (37.2 C) (Oral)   Resp 19   Ht 5\' 11"  (1.803 m)   Wt 274 lb (124.3 kg)   SpO2 100%   BMI 38.22 kg/m   Physical Exam  Constitutional: He is oriented to person, place, and time. Vital signs are normal. He appears well-developed and well-nourished.  Non-toxic appearance. He does not appear ill. No distress.  HENT:  Head: Normocephalic and atraumatic.  Nose: Nose normal.  He has an ace wrap with ice pack over his  jaw  Eyes: Conjunctivae and EOM are normal. Pupils are equal, round, and reactive to light. No scleral icterus.  Neck: Normal range of motion. Neck supple. No tracheal deviation, no edema, no erythema and normal range of motion present. No thyroid mass and no thyromegaly present.  Cardiovascular: Normal rate, regular rhythm, S1 normal, S2 normal, normal heart sounds, intact distal pulses and normal pulses.  Exam reveals no gallop and no friction rub.   No murmur heard. Pulmonary/Chest: Effort normal and breath sounds normal. No respiratory distress. He has no wheezes. He has no rhonchi. He has no rales.  Abdominal: Soft. Normal appearance and bowel sounds are normal. He exhibits distension. He exhibits no ascites and no mass. There is no hepatosplenomegaly. There is no tenderness (suprapubic TTP). There is no rebound, no guarding and no CVA tenderness.  Musculoskeletal: Normal range of motion. He exhibits edema. He exhibits no tenderness.  BLE edema. Left upper extremity AV  fistula with palpable thrill  Lymphadenopathy:    He has no cervical adenopathy.  Neurological: He is alert and oriented to person, place, and time. He has normal strength. No cranial nerve deficit or sensory deficit.  Skin: Skin is warm, dry and intact. No petechiae and no rash  noted. He is not diaphoretic. No erythema. No pallor.  Nursing note and vitals reviewed.  ED Treatments / Results  DIAGNOSTIC STUDIES:  Oxygen Saturation is 100% on RA, nl by my interpretation.    COORDINATION OF CARE:  11:40 PM Will order urine culture and urinalysis. Discussed treatment plan with pt at bedside and pt agreed to plan.   Labs (all labs ordered are listed, but only abnormal results are displayed) Labs Reviewed  BASIC METABOLIC PANEL - Abnormal; Notable for the following:       Result Value   Chloride 96 (*)    Glucose, Bld 187 (*)    BUN 44 (*)    Creatinine, Ser 9.62 (*)    GFR calc non Af Amer 5 (*)    GFR calc Af Amer 6 (*)    All other components within normal limits  CBC - Abnormal; Notable for the following:    WBC 12.8 (*)    RBC 3.47 (*)    Hemoglobin 10.4 (*)    HCT 33.0 (*)    RDW 17.4 (*)    All other components within normal limits  URINALYSIS, ROUTINE W REFLEX MICROSCOPIC    EKG  EKG Interpretation None       Radiology No results found.  Procedures Procedures (including critical care time)  Medications Ordered in ED Medications - No data to display   Initial Impression / Assessment and Plan / ED Course  I have reviewed the triage vital signs and the nursing notes.  Pertinent labs & imaging results that were available during my care of the patient were reviewed by me and considered in my medical decision making (see chart for details).     Patient presents to the ED for urinary retension.  Likely due to his anesthesia.  Will place foley catheter and advised PCP fu within 3 days for possible removal.  Culture sent.  He appears well and in NAD. VS remain within his normal limits and he is safe for DC.  Final Clinical Impressions(s) / ED Diagnoses   Final diagnoses:  None    New Prescriptions New Prescriptions   No medications on file    I personally performed the services described in this documentation, which was scribed  in my presence. The recorded information has been reviewed and is accurate.      [name], MD 04/08/17 0021

## 2017-04-07 NOTE — H&P (Signed)
04/07/2017  Patient:            [name] Date of Birth:  02/08/1951 MRN:                [id number]   BP (!) 195/77   Pulse 70   Temp 98.1 F (36.7 C) (Oral)   Resp 20   Ht 5\' 11"  (1.803 m)   Wt 274 lb (124.3 kg)   SpO2 100%   BMI 38.22 kg/m   [name] is a 66 yo male that presents for extraction remaining teeth with alveoloplasty in the operating room with general anesthesia. Patient is currently on vancomycin IV antibiotic therapy during his hemodialysis sessions for treatment of endocarditis. The patient denies any acute medical or dental changes. Please see note on 03/31/2017 from Dr. Georges Lynch to act as H&P for the dental operating room procedure.  Lenn Cal, DDS   Progress Notes Encounter Date: 03/31/2017 [name], MD  Family Medicine    [] Hide copied text [] Hover for attribution information   HPI  CC: Hospital follow-up Patient is here for hospital follow-up. He states that he feels well overall and has had no setbacks regarding his hemodialysis or administration of IV vancomycin. He denies any fevers, headaches, new fatigue, vision changes, shortness of breath, or chest pain.  Patient endorses persistent watery diarrhea. He states that this had continued throughout his stay in the hospital and it had been addressed prior to that admission as well. Previous C. difficile testing was negative. Patient continues to have numerous loose/watery bowel movements every day. Denies hematochezia or melena. Denies nausea or vomiting. Denies abdominal pain. Patient has had some bowel urgency.  Review of Systems See HPI for ROS.   CC, SH/smoking status, and VS noted  Objective: BP (!) 158/74   Pulse 62   Temp 98.7 F (37.1 C) (Oral)   Wt 272 lb (123.4 kg)   SpO2 99%   BMI 37.94 kg/m  Gen: NAD, alert, cooperative, and pleasant. HEENT: NCAT, EOMI, PERRL, MMM CV: RRR, no murmur Resp: CTAB, no wheezes, non-labored Abd: SNTND, BS hyperactive, no  guarding or organomegaly Ext: No edema, warm, dialysis fistula noted Neuro: Alert and oriented, Speech clear, No gross deficits   Assessment and plan:  Diarrhea Patient continues to have issues with diarrhea. No improvement since discharge. No hematochezia or melena. Previous C. difficile testing was negative. Due to patient's complicated medical history, not to mention need for a colonoscopy later this year, I have referred patient to gastroenterology for evaluation.  Bacteremia Continue current regimen with vancomycin during HD sessions. Follow-up in 1-2 months.  HYPERTENSION, BENIGN SYSTEMIC No setbacks at this time. - Informed patient of change to clonidine patch from 1 patch every 3 days, to 1 patch weekly.        Orders Placed This Encounter  Procedures  . Ambulatory referral to Gastroenterology    Referral Priority:   Routine    Referral Type:   Consultation    Referral Reason:   Specialty Services Required    Number of Visits Requested:   1        Meds ordered this encounter  Medications  . cloNIDine (CATAPRES - DOSED IN MG/24 HR) 0.1 mg/24hr patch    Sig: Place 1 patch (0.1 mg total) onto the skin once a week.    Dispense:  4 patch    Refill:  5     [name], MD,MS,  PGY3 03/31/2017 12:33  PM      Electronically signed by [name], MD at 03/31/2017 12:34 PM

## 2017-04-07 NOTE — ED Triage Notes (Signed)
Pt had a mouth sx today and have 21 teeth removed today since this surgery in the morning pt is not able to have a BM or urinate, pt is a HD pt tuesdays, Thr, Saturday, last hd yesterday, pt c/0 10/10 lowe bilateral abd pain.

## 2017-04-07 NOTE — Discharge Instructions (Signed)

## 2017-04-07 NOTE — Transfer of Care (Signed)
Immediate Anesthesia Transfer of Care Note  Patient: [name]  Procedure(s) Performed: Procedure(s): Extraction of tooth #'s 1-11, 13, 14,16, 20-23, and 26-28 with alveoloplasty (N/A)  Patient Location: PACU  Anesthesia Type:General  Level of Consciousness: awake, alert  and oriented  Airway & Oxygen Therapy: Patient Spontanous Breathing and Patient connected to face mask oxygen  Post-op Assessment: Report given to RN and Post -op Vital signs reviewed and stable  Post vital signs: Reviewed and stable  Last Vitals:  Vitals:   04/07/17 0806  BP: (!) 195/77  Pulse: 70  Resp: 20  Temp: 36.7 C    Last Pain:  Vitals:   04/07/17 0806  TempSrc: Oral         Complications: No apparent anesthesia complications

## 2017-04-07 NOTE — Op Note (Signed)
OPERATIVE REPORT  Patient:            [name] Date of Birth:  08-25-1951 MRN:                [id number]   DATE OF PROCEDURE:  04/07/2017  PREOPERATIVE DIAGNOSES: 1. Endocarditis 2. Chronic apical periodontitis 3. Dental caries 4. Multiple retained root segments 5. Chronic periodontitis 6. Loose teeth  7. Malocclusion  POSTOPERATIVE DIAGNOSES: 1. Endocarditis 2. Chronic apical periodontitis 3. Dental caries 4. Multiple retained root segments 5. Chronic periodontitis 6. Loose teeth 7. Malocclusion   OPERATIONS: 1. Multiple extraction of tooth numbers 1-11, 13, 14, 16, 20-23, and 26-29  2. 4 Quadrants of alveoloplasty   SURGEON: Lenn Cal, DDS  ASSISTANT: Camie Patience, (dental assistant)  ANESTHESIA: General anesthesia via nasoendotracheal tube.  MEDICATIONS: 1. Vancomycin IV with hemodialysis on Tuesday, Thursday, and Saturday per infectious disease orders.  2. Local anesthesia with a total utilization of 6 carpules each containing 34 mg of lidocaine with 0.017 mg of epinephrine as well as 2 carpules each containing 9 mg of bupivacaine with 0.009 mg of epinephrine.  SPECIMENS: There are 22 teeth that were discarded.  DRAINS: None  CULTURES: None  COMPLICATIONS: None   ESTIMATED BLOOD LOSS: 100 mLs.  INTRAVENOUS FLUIDS: 600 mLs of normal saline solution  INDICATIONS: The patient was recently diagnosed with endocarditis.  A medically necessary dental consultation was then requested to evaluate poor dentition as a source for the endocarditis.  The patient was examined and treatment planned for extraction of remaining teeth with alveoloplasty in the operating room with general anesthesia.  This treatment plan was formulated to decrease the risks and complications associated with dental infection from further affecting the patient's systemic health.   OPERATIVE FINDINGS: Patient was examined operating room number 11.  The teeth were identified  for extraction. The patient was noted be affected by chronic periodontitis, chronic apical periodontitis, dental caries, multiple retained root segments, loose teeth, and malocclusion.   DESCRIPTION OF PROCEDURE: Patient was brought to the main operating room number 11. Patient was then placed in the supine position on the operating table. General anesthesia was then induced per the anesthesia team. The patient was then prepped and draped in the usual manner for dental medicine procedure. A timeout was performed. The patient was identified and procedures were verified. A throat pack was placed at this time. The oral cavity was then thoroughly examined with the findings noted above. The patient was then ready for dental medicine procedure as follows:  Local anesthesia was then administered sequentially with a total utilization of 6 carpules each containing 34 mg of lidocaine with 0.017 mg of epinephrine as well as 2 carpules  each containing 9 mg bupivacaine with 0.009 mg of epinephrine.  The Maxillary left and right quadrants first approached. Anesthesia was then delivered utilizing infiltration with lidocaine with epinephrine. A #15 blade incision was then made from the maxillary right tuberosity and extended to the maxillary left tuberosity.  A  surgical flap was then carefully reflected. The teeth were then subluxated with a series of straight elevators. Appropriate amounts of buccal and interseptal bone were then removed utilizing a surgical handpiece and bur and copious amounts of sterile water around tooth numbers 1, 2, 11, 13, 14, and 16.  The teeth were then again subluxated with a series of straight elevators. Tooth numbers 1-11, 13, 14, and 15 were then removed with a 150 forceps without complications. Alveoloplasty was then performed  utilizing a ronguers and bone file to help achieve primary closure. The surgical site was then irrigated with copious amounts of sterile saline. The tissues were  approximated and trimmed appropriately. A piece of Surgifoam was placed in the maxillary left molar extraction sites. The maxillary right surgical site was then closed from the maxillary right tuberosity and extended the mesial #8 utilizing 3-0 chromic gut suture in a continuous interrupted suture technique 1. The maxillary left surgical site was then closed from the maxillary left tuberosity and extended the mesial #9 utilizing 3-0 chromic gut suture in a continuous interrupted suture technique 1. 2 individual interrupted sutures then placed in the area of #8 and #9 to further close the surgical site utilizing 4-0 chromic gut material.  At this point time, the mandibular quadrants were approached. The patient was given bilateral inferior alveolar nerve blocks and long buccal nerve blocks utilizing the bupivacaine with epinephrine. Further infiltration was then achieved utilizing the lidocaine with epinephrine. A 15 blade incision was then made from the distal of number 18 and extended to the distal of #31.  A surgical flap was then carefully reflected. The lower teeth were then subluxated with a series of straight elevators. Appropriate amounts of buccal and interseptal bone were then removed utilizing a surgical handpiece and copious amount of sterile water around tooth numbers 20, 21, 22, 27, 28, and 29. The lower teeth were then again subluxated with a series of straight elevators. Tooth numbers  20-23 and 26-29 were then removed utilizing a 151 forceps. Alveoloplasty was then performed utilizing a rongeurs and bone file to help achieve primary closure. The tissues were approximated and trimmed appropriately. The surgical sites were then irrigated with copious amounts of sterile saline. The mandibular left surgical site was then closed from the distal of  18 and extended to the mesial of #24 utilizing 3-0 chromic gut suture in a continuous interrupted suture technique 1. The mandibular right surgical site  was then closed from the distal of 31 and extended the mesial #25 utilizing 3-0 chromic gut suture in a continuous interrupted suture technique 1.   At this point time, the entire mouth was irrigated with copious amounts of sterile saline. The patient was examined for complications, seeing none, the dental medicine procedure was deemed to be complete. The throat pack was removed at this time. An oral airway was then placed at the request of the anesthesia team. A series of 4 x 4 gauze moistened with Amicar 5% rinse were placed in the mouth to aid hemostasis. The patient was then handed over to the anesthesia team for final disposition. After an appropriate amount of time, the patient was extubated and taken to the postanesthsia care unit in good condition. All counts were correct for the dental medicine procedure. The patient will continue the Amicar 5% rinses postoperatively. Patient is to rinse with 10 ML's every hour for the next 10 hours in a swish and spit manner. Patient will use Percocet 5/325 for pain. Patient is to take one to 2 tablets every 6 hours as needed for moderate to severe pain.   Lenn Cal, DDS.

## 2017-04-07 NOTE — Anesthesia Postprocedure Evaluation (Addendum)
Anesthesia Post Note  Patient: [name]  Procedure(s) Performed: Procedure(s) (LRB): Extraction of tooth #'s 1-11, 13, 14,16, 20-23, and 26-28 with alveoloplasty (N/A)  Patient location during evaluation: PACU Anesthesia Type: General Level of consciousness: awake and oriented Pain management: pain level controlled Vital Signs Assessment: post-procedure vital signs reviewed and stable Respiratory status: spontaneous breathing, nonlabored ventilation, respiratory function stable and patient connected to nasal cannula oxygen Cardiovascular status: blood pressure returned to baseline and stable Postop Assessment: no signs of nausea or vomiting Anesthetic complications: no       Last Vitals:  Vitals:   04/07/17 1312 04/07/17 1320  BP:  (!) 147/68  Pulse: 64 62  Resp: 18   Temp:      Last Pain:  Vitals:   04/07/17 1203  TempSrc:   PainSc: Asleep                 Eagan Shifflett,JAMES TERRILL

## 2017-04-07 NOTE — ED Notes (Addendum)
Pt reports having 20+ teeth removed today. Pt is a dialysis pt that produces urine but has not produced any since surgery. Pt has also not drank anything today either. Pt is on a 32oz fluid restriction.

## 2017-04-07 NOTE — Anesthesia Preprocedure Evaluation (Addendum)
Anesthesia Evaluation  Patient identified by MRN, date of birth, ID band Patient awake    Reviewed: Allergy & Precautions, NPO status , Patient's Chart, lab work & pertinent test results  Airway Mallampati: II  TM Distance: >3 FB Neck ROM: Full    Dental  (+) Poor Dentition, Missing   Pulmonary shortness of breath, former smoker,    breath sounds clear to auscultation       Cardiovascular hypertension, + Peripheral Vascular Disease and +CHF   Rhythm:Regular Rate:Normal     Neuro/Psych    GI/Hepatic hiatal hernia, GERD  ,  Endo/Other  diabetesMorbid obesity  Renal/GU ESRFRenal disease     Musculoskeletal   Abdominal (+) + obese,   Peds  Hematology   Anesthesia Other Findings   Reproductive/Obstetrics                            Anesthesia Physical Anesthesia Plan  ASA: IV  Anesthesia Plan: General   Post-op Pain Management:    Induction: Intravenous  Airway Management Planned: Nasal ETT  Additional Equipment:   Intra-op Plan:   Post-operative Plan: Extubation in OR and Possible Post-op intubation/ventilation  Informed Consent: I have reviewed the patients History and Physical, chart, labs and discussed the procedure including the risks, benefits and alternatives for the proposed anesthesia with the patient or authorized representative who has indicated his/her understanding and acceptance.     Plan Discussed with:   Anesthesia Plan Comments:        Anesthesia Quick Evaluation

## 2017-04-07 NOTE — Progress Notes (Signed)
PRE-OPERATIVE NOTE:  04/07/2017 [name] [id number]  VITALS: BP (!) 195/77   Pulse 70   Temp 98.1 F (36.7 C) (Oral)   Resp 20   Ht 5\' 11"  (1.803 m)   Wt 274 lb (124.3 kg)   SpO2 100%   BMI 38.22 kg/m   Lab Results  Component Value Date   WBC 8.3 03/28/2017   HGB 11.6 (L) 04/07/2017   HCT 34.0 (L) 04/07/2017   MCV 95.1 03/28/2017   PLT 177 03/28/2017   BMET    Component Value Date/Time   NA 139 04/07/2017 0801   NA 138 03/03/2017 1635   NA 142 07/03/2014 0814   K 4.1 04/07/2017 0801   K 4.4 07/03/2014 0814   CL 95 (L) 03/28/2017 0446   CO2 25 03/28/2017 0446   CO2 20 (L) 07/03/2014 0814   GLUCOSE 167 (H) 04/07/2017 0801   GLUCOSE 136 07/03/2014 0814   BUN 54 (H) 03/28/2017 0446   BUN 36 (H) 03/03/2017 1635   BUN 93.6 (H) 07/03/2014 0814   CREATININE 11.51 (H) 03/28/2017 0446   CREATININE 7.3 (HH) 07/03/2014 0814   CALCIUM 9.4 03/28/2017 0446   CALCIUM 8.6 07/03/2014 0814   GFRNONAA 4 (L) 03/28/2017 0446   GFRNONAA 31 (L) 08/16/2012 1517   GFRAA 5 (L) 03/28/2017 0446   GFRAA 36 (L) 08/16/2012 1517    Lab Results  Component Value Date   INR 1.51 03/22/2017   INR 1.40 03/21/2017   INR 1.18 06/25/2015   No results found for: PTT   [name] presents for extraction of remaining teeth with alveoloplasty in the operative room with general anesthesia.    SUBJECTIVE: The patient denies any acute medical or dental changes and agrees to proceed with treatment as planned.  EXAM: No sign of acute dental changes.  ASSESSMENT: Patient is affected by chronic apical periodontitis, multiple retained root segments, chronic periodontitis, loose teeth, dental caries, and malocclusion.   PLAN: Patient agrees to proceed with treatment as planned in the operating room as previously discussed and accepts the risks, benefits, and complications of the proposed treatment. Patient is aware of the risk for bleeding, bruising, swelling, infection, pain, nerve  damage, soft tissue damage , sinus involvement, root tip fracture, mandible fracture, and the risks of complications associated with the anesthesia. Patient also is aware of the potential for other complications up to and including death due to his overall medical compromise.    Lenn Cal, DDS

## 2017-04-08 ENCOUNTER — Encounter (HOSPITAL_COMMUNITY): Payer: Self-pay | Admitting: Dentistry

## 2017-04-08 LAB — URINALYSIS, ROUTINE W REFLEX MICROSCOPIC
BILIRUBIN URINE: NEGATIVE
Bacteria, UA: NONE SEEN
GLUCOSE, UA: 150 mg/dL — AB
Ketones, ur: NEGATIVE mg/dL
Leukocytes, UA: NEGATIVE
Nitrite: NEGATIVE
PH: 7 (ref 5.0–8.0)
Protein, ur: 100 mg/dL — AB
SPECIFIC GRAVITY, URINE: 1.009 (ref 1.005–1.030)
Squamous Epithelial / LPF: NONE SEEN

## 2017-04-09 LAB — URINE CULTURE: Culture: <10000 — AB

## 2017-04-10 ENCOUNTER — Ambulatory Visit (INDEPENDENT_AMBULATORY_CARE_PROVIDER_SITE_OTHER): Payer: Medicare Other | Admitting: Family Medicine

## 2017-04-10 DIAGNOSIS — R339 Retention of urine, unspecified: Secondary | ICD-10-CM

## 2017-04-10 NOTE — Patient Instructions (Signed)
It was a pleasure seeing you today in our clinic. Today we discussed removal of your catheter. Here is the treatment plan we have discussed and agreed upon together:   - Be very mindful of your urine output over the next 48 hours.  - If you have not urinated (even a small amount) by 4 PM today I would like to have you contact our office and see me by 5 PM.  - If your urine output seems to be present initially but then becomes difficult or strained over the next 24-48 hours do not hesitate to contact our office or report to the emergency department. - You may have some discomfort while urinating for the next 2-3 days. This should improve. If you notice that this discomfort worsens or you develop fevers/chills do not hesitate to be seen in our office or in the emergency department.

## 2017-04-10 NOTE — Assessment & Plan Note (Signed)
Patient was seen in ED on 4/27 for issues with urinary retention after undergoing a surgical procedure. Symptoms resolved after placement of Foley catheter. He is here for removal of catheter. No signs or symptoms of UTI on exam. Patient tolerated Foley catheter removal well. - Strict return precautions provided in clinic and on patient's AVS. Patient stated his understanding of these requests and willingness to perform them if necessary.

## 2017-04-10 NOTE — Progress Notes (Signed)
   HPI  CC: Catheter removal Patient is here for a Foley catheter removal. Patient states that on 4/27 he underwent surgery to remove many of his teeth due to extensive dental caries. He states that after the procedure he had problems with urinary retention. He was seen in the ED who placed a Foley catheter. Since that time he has had no issues with urinary output. He was informed by the ED provider to follow-up with [place] today for removal. He states that he feels well overall. No flank pain, fevers, chills, nausea, vomiting, diarrhea. He endorses discomfort from the Foley site but he feels that this is due to its placement rather than any infectious process.  Review of Systems See HPI for ROS.   CC, SH/smoking status, and VS noted  Objective: BP (!) 162/78   Pulse 98   Temp 98.1 F (36.7 C) (Oral)   Wt 274 lb (124.3 kg)   BMI 38.22 kg/m  Gen: NAD, alert, cooperative, and pleasant. CV: RRR, no murmur Resp: CTAB, no wheezes, non-labored GU: Foley catheter in place, no evidence of scrotal/penile edema, erythema, drainage. Ext: No edema, warm   Assessment and plan:  Urinary retention Patient was seen in ED on 4/27 for issues with urinary retention after undergoing a surgical procedure. Symptoms resolved after placement of Foley catheter. He is here for removal of catheter. No signs or symptoms of UTI on exam. Patient tolerated Foley catheter removal well. - Strict return precautions provided in clinic and on patient's AVS. Patient stated his understanding of these requests and willingness to perform them if necessary.   [name], MD,MS,  PGY3 04/10/2017 6:27 PM

## 2017-04-12 ENCOUNTER — Encounter (HOSPITAL_COMMUNITY): Payer: Self-pay | Admitting: *Deleted

## 2017-04-12 ENCOUNTER — Inpatient Hospital Stay (HOSPITAL_COMMUNITY)
Admission: EM | Admit: 2017-04-12 | Discharge: 2017-04-15 | DRG: 689 | Disposition: A | Discharge status: Home / Self Care | Payer: Medicare Other | Attending: Family Medicine | Admitting: Family Medicine

## 2017-04-12 ENCOUNTER — Emergency Department (HOSPITAL_COMMUNITY): Payer: Medicare Other

## 2017-04-12 DIAGNOSIS — D472 Monoclonal gammopathy: Secondary | ICD-10-CM | POA: Diagnosis present

## 2017-04-12 DIAGNOSIS — Z89421 Acquired absence of other right toe(s): Secondary | ICD-10-CM

## 2017-04-12 DIAGNOSIS — N2581 Secondary hyperparathyroidism of renal origin: Secondary | ICD-10-CM | POA: Diagnosis present

## 2017-04-12 DIAGNOSIS — E8889 Other specified metabolic disorders: Secondary | ICD-10-CM | POA: Diagnosis present

## 2017-04-12 DIAGNOSIS — N3020 Other chronic cystitis without hematuria: Secondary | ICD-10-CM | POA: Diagnosis not present

## 2017-04-12 DIAGNOSIS — Z87891 Personal history of nicotine dependence: Secondary | ICD-10-CM

## 2017-04-12 DIAGNOSIS — Z89422 Acquired absence of other left toe(s): Secondary | ICD-10-CM

## 2017-04-12 DIAGNOSIS — I890 Lymphedema, not elsewhere classified: Secondary | ICD-10-CM | POA: Diagnosis present

## 2017-04-12 DIAGNOSIS — N390 Urinary tract infection, site not specified: Secondary | ICD-10-CM | POA: Diagnosis present

## 2017-04-12 DIAGNOSIS — D638 Anemia in other chronic diseases classified elsewhere: Secondary | ICD-10-CM

## 2017-04-12 DIAGNOSIS — E1122 Type 2 diabetes mellitus with diabetic chronic kidney disease: Secondary | ICD-10-CM | POA: Diagnosis present

## 2017-04-12 DIAGNOSIS — K449 Diaphragmatic hernia without obstruction or gangrene: Secondary | ICD-10-CM | POA: Diagnosis present

## 2017-04-12 DIAGNOSIS — E113599 Type 2 diabetes mellitus with proliferative diabetic retinopathy without macular edema, unspecified eye: Secondary | ICD-10-CM | POA: Diagnosis present

## 2017-04-12 DIAGNOSIS — N186 End stage renal disease: Secondary | ICD-10-CM | POA: Diagnosis present

## 2017-04-12 DIAGNOSIS — K219 Gastro-esophageal reflux disease without esophagitis: Secondary | ICD-10-CM | POA: Diagnosis present

## 2017-04-12 DIAGNOSIS — Z91013 Allergy to seafood: Secondary | ICD-10-CM

## 2017-04-12 DIAGNOSIS — I132 Hypertensive heart and chronic kidney disease with heart failure and with stage 5 chronic kidney disease, or end stage renal disease: Secondary | ICD-10-CM | POA: Diagnosis present

## 2017-04-12 DIAGNOSIS — Z961 Presence of intraocular lens: Secondary | ICD-10-CM | POA: Diagnosis present

## 2017-04-12 DIAGNOSIS — Z833 Family history of diabetes mellitus: Secondary | ICD-10-CM

## 2017-04-12 DIAGNOSIS — Z992 Dependence on renal dialysis: Secondary | ICD-10-CM

## 2017-04-12 DIAGNOSIS — E785 Hyperlipidemia, unspecified: Secondary | ICD-10-CM | POA: Diagnosis present

## 2017-04-12 DIAGNOSIS — Z7982 Long term (current) use of aspirin: Secondary | ICD-10-CM

## 2017-04-12 DIAGNOSIS — I872 Venous insufficiency (chronic) (peripheral): Secondary | ICD-10-CM | POA: Diagnosis present

## 2017-04-12 DIAGNOSIS — G9340 Encephalopathy, unspecified: Secondary | ICD-10-CM | POA: Diagnosis present

## 2017-04-12 DIAGNOSIS — E1151 Type 2 diabetes mellitus with diabetic peripheral angiopathy without gangrene: Secondary | ICD-10-CM | POA: Diagnosis present

## 2017-04-12 DIAGNOSIS — N5089 Other specified disorders of the male genital organs: Secondary | ICD-10-CM

## 2017-04-12 DIAGNOSIS — I480 Paroxysmal atrial fibrillation: Secondary | ICD-10-CM | POA: Diagnosis present

## 2017-04-12 DIAGNOSIS — I5032 Chronic diastolic (congestive) heart failure: Secondary | ICD-10-CM | POA: Diagnosis present

## 2017-04-12 DIAGNOSIS — Z87442 Personal history of urinary calculi: Secondary | ICD-10-CM

## 2017-04-12 DIAGNOSIS — Z79899 Other long term (current) drug therapy: Secondary | ICD-10-CM

## 2017-04-12 DIAGNOSIS — Z8249 Family history of ischemic heart disease and other diseases of the circulatory system: Secondary | ICD-10-CM

## 2017-04-12 DIAGNOSIS — R4182 Altered mental status, unspecified: Secondary | ICD-10-CM | POA: Diagnosis present

## 2017-04-12 DIAGNOSIS — N4889 Other specified disorders of penis: Secondary | ICD-10-CM | POA: Diagnosis present

## 2017-04-12 LAB — I-STAT CG4 LACTIC ACID, ED: Lactic Acid, Venous: 1.25 mmol/L (ref 0.5–1.9)

## 2017-04-12 LAB — URINALYSIS, ROUTINE W REFLEX MICROSCOPIC
Bilirubin Urine: NEGATIVE
GLUCOSE, UA: NEGATIVE mg/dL
KETONES UR: NEGATIVE mg/dL
NITRITE: NEGATIVE
PH: 8 (ref 5.0–8.0)
Protein, ur: 100 mg/dL — AB
SPECIFIC GRAVITY, URINE: 1.009 (ref 1.005–1.030)
SQUAMOUS EPITHELIAL / LPF: NONE SEEN

## 2017-04-12 LAB — COMPREHENSIVE METABOLIC PANEL
ALBUMIN: 2.9 g/dL — AB (ref 3.5–5.0)
ALT: 15 U/L — ABNORMAL LOW (ref 17–63)
ANION GAP: 13 (ref 5–15)
AST: 17 U/L (ref 15–41)
Alkaline Phosphatase: 60 U/L (ref 38–126)
BUN: 44 mg/dL — ABNORMAL HIGH (ref 6–20)
CALCIUM: 8.5 mg/dL — AB (ref 8.9–10.3)
CO2: 23 mmol/L (ref 22–32)
CREATININE: 11.26 mg/dL — AB (ref 0.61–1.24)
Chloride: 96 mmol/L — ABNORMAL LOW (ref 101–111)
GFR calc Af Amer: 5 mL/min — ABNORMAL LOW (ref >60)
GFR calc non Af Amer: 4 mL/min — ABNORMAL LOW (ref >60)
GLUCOSE: 113 mg/dL — AB (ref 65–99)
POTASSIUM: 4.4 mmol/L (ref 3.5–5.1)
SODIUM: 132 mmol/L — AB (ref 135–145)
TOTAL PROTEIN: 7.7 g/dL (ref 6.5–8.1)
Total Bilirubin: 0.7 mg/dL (ref 0.3–1.2)

## 2017-04-12 LAB — CBC WITH DIFFERENTIAL/PLATELET
BASOS PCT: 0 %
Basophils Absolute: 0 10*3/uL (ref 0.0–0.1)
EOS ABS: 0.2 10*3/uL (ref 0.0–0.7)
EOS PCT: 3 %
HCT: 29.4 % — ABNORMAL LOW (ref 39.0–52.0)
HEMOGLOBIN: 9.3 g/dL — AB (ref 13.0–17.0)
Lymphocytes Relative: 13 %
Lymphs Abs: 0.9 10*3/uL (ref 0.7–4.0)
MCH: 29.5 pg (ref 26.0–34.0)
MCHC: 31.6 g/dL (ref 30.0–36.0)
MCV: 93.3 fL (ref 78.0–100.0)
Monocytes Absolute: 0.5 10*3/uL (ref 0.1–1.0)
Monocytes Relative: 7 %
NEUTROS PCT: 77 %
Neutro Abs: 5.2 10*3/uL (ref 1.7–7.7)
PLATELETS: 165 10*3/uL (ref 150–400)
RBC: 3.15 MIL/uL — AB (ref 4.22–5.81)
RDW: 16.7 % — ABNORMAL HIGH (ref 11.5–15.5)
WBC: 6.7 10*3/uL (ref 4.0–10.5)

## 2017-04-12 LAB — PROTIME-INR
INR: 1.15
PROTHROMBIN TIME: 14.8 s (ref 11.4–15.2)

## 2017-04-12 LAB — CBG MONITORING, ED: Glucose-Capillary: 107 mg/dL — ABNORMAL HIGH (ref 65–99)

## 2017-04-12 MED ORDER — DEXTROSE 5 % IV SOLN
2.0000 g | Freq: Once | INTRAVENOUS | Status: AC
  Administered 2017-04-13: 2 g via INTRAVENOUS
  Filled 2017-04-12: qty 2

## 2017-04-12 NOTE — ED Triage Notes (Signed)
Pt and family member reports last dialysis was yesterday. Pt having fatigue and altered today "not acting like himself" and sleeping a lot. Reports fever and chills.  Pt had dental extractions done this past Friday had urinary retention and had foley until  Yesterday. Has dressings to bilateral legs, denies recent increase in drainage. Denies cough.

## 2017-04-12 NOTE — ED Provider Notes (Signed)
[facility] DEPT Provider Note   CSN: [id number] Arrival date & time: 04/12/17  1818     History   Chief Complaint Chief Complaint  Patient presents with  . Fever  . Altered Mental Status    HPI [name] is a 66 y.o. male.  HPI  66 y.o. male with a hx of CHF, ESRD on Dialysis Tues/Thurs/Sat, DM, HTN, HLD, Morbid Obesity, presents to the Emergency Department today complaining of fatigue and AMS with increased lethargy today. Noted subjective fevers as well as chills. Pt with known recent dental extraction on Friday with Foley catherted placed due to urinary retention, that was removed Monday. Notes suprapubic discomfort as well as pain and meatus of penis. No dysuria. No N/V. No CP/SOB. Notes subjective fevers. Wife states that the patient became increasingly weak and was difficult to arouse at time at home. Noted change in mental status and decided to come to ED. No other symptoms noted.    Past Medical History:  Diagnosis Date  . Arthritis   . Asthma    as a child  . CHF (congestive heart failure) ([place])   . Chronic cystitis   . Chronic kidney disease   . Diabetes mellitus   . Dry skin   . Dyspnea   . Dysrhythmia   . GERD (gastroesophageal reflux disease)   . H/O hiatal hernia    states it's been fixed  . Hyperlipidemia   . Hypertension   . Lymphedema   . Morbid obesity ([place])   . NEPHROLITHIASIS, HX OF 12/02/2009   Qualifier: Diagnosis of  By: [name], Cat    . PVD (peripheral vascular disease) ([place])   . Renal insufficiency   . UMBILICAL HERNIA 1/60/7371   Qualifier: History of  By: [name] MD, Audelia Acton    . Venous insufficiency     Patient Active Problem List   Diagnosis Date Noted  . Urinary retention 04/10/2017  . Bacteremia   . RUQ abdominal pain   . Gross hematuria   . Other pancytopenia ([place])   . Sepsis ([place]) 03/21/2017  . Diarrhea 03/03/2017  . Venous stasis ulcers of both lower extremities ([place]) 08/07/2015  . Varicose veins of lower extremities  with complications 06/06/9484  . Malfunction of arteriovenous dialysis fistula ([place])   . CHF (congestive heart failure) ([place]) 06/23/2015  . Lymphedema distichiasis syndrome with kidney disease and diabetes mellitus ([place]) 04/06/2015  . Lymphedema of lower extremity 04/04/2015  . DM (diabetes mellitus), type 2 with renal complications ([place]) 46/27/0350  . MGUS (monoclonal gammopathy of unknown significance) 06/12/2014  . Special screening for malignant neoplasms, colon 07/09/2013  . Proliferative diabetic retinopathy ([place]) 10/23/2012  . Knee pain 08/21/2012  . Mass of thigh 08/21/2012  . ORGANIC IMPOTENCE 12/02/2009  . ONYCHOMYCOSIS 07/17/2009  . ESRD (end stage renal disease) on dialysis ([place]) 06/09/2008  . Hyperlipidemia 03/19/2007  . OBESITY, MORBID 02/08/2007  . HYPERTENSION, BENIGN SYSTEMIC 02/08/2007  . Atherosclerosis of native arteries of extremity with intermittent claudication ([place]) 02/08/2007    Past Surgical History:  Procedure Laterality Date  . AMPUTATION     Right and left fifth toes.   . AMPUTATION TOE     emoval of both little toes  . AV FISTULA PLACEMENT Left 03/21/2014   Procedure: ARTERIOVENOUS (AV) FISTULA CREATION with ultrasound;  Surgeon: [name], MD;  Location: [facility];  Service: Vascular;  Laterality: Left;  . COLONOSCOPY    . EYE SURGERY Bilateral    cataract and lens implant  .  HERNIA REPAIR    . LIGATION OF COMPETING BRANCHES OF ARTERIOVENOUS FISTULA Left 06/29/2015   Procedure: LIGATION OF LEFT ARM RADIOCEPHALIC ARTERIOVENOUS FISTULA SIDE BRANCHES;  Surgeon: Conrad Etowah, MD;  Location: [facility];  Service: Vascular;  Laterality: Left;  [name] MULTIPLE EXTRACTIONS WITH ALVEOLOPLASTY N/A 04/07/2017   Procedure: Extraction of tooth #'s 1-11, 13, 14,16, 20-23, and 26-28 with alveoloplasty;  Surgeon: Lenn Cal, DDS;  Location: [facility];  Service: Oral Surgery;  Laterality: N/A;  . Popliteal to posterior tibial bypass     2006  . R knee arthoscopic repair of  meniscus    . UMBILICAL HERNIA REPAIR         Home Medications    Prior to Admission medications   Medication Sig Start Date End Date Taking? Authorizing Provider  amLODipine (NORVASC) 10 MG tablet Take 10 mg by mouth every morning.    Historical Provider, MD  aspirin EC 81 MG tablet Take 81 mg by mouth every morning.    Historical Provider, MD  calcium acetate (PHOSLO) 667 MG capsule Take 2,001-2,668 mg by mouth See admin instructions. Takes 4 capsules three times a day with meals and 3 capsules with each snacks. 07/08/15   Historical Provider, MD  carvedilol (COREG) 6.25 MG tablet Take 1 tablet (6.25 mg total) by mouth 2 (two) times daily with a meal. 03/28/17   [name], MD  cloNIDine (CATAPRES - DOSED IN MG/24 HR) 0.1 mg/24hr patch Place 1 patch (0.1 mg total) onto the skin once a week. 03/31/17   [name], MD  furosemide (LASIX) 80 MG tablet Take 1 tablet (80 mg total) by mouth 2 (two) times daily. 06/15/16   [name], MD  oxyCODONE-acetaminophen (PERCOCET) 5-325 MG tablet Take one or two tablets by mouth every 6 hours as needed for pain. 04/07/17   Lenn Cal, DDS  pantoprazole (PROTONIX) 40 MG tablet Take 40 mg by mouth daily. 03/13/17   Historical Provider, MD  urea 10 % lotion Apply topically 2 (two) times daily. Patient taking differently: Apply 1 application topically daily.  03/28/17   [name], MD  vancomycin IVPB Inject 1,250 mg into the vein Every Tuesday,Thursday,and Saturday with dialysis. Indication:  bacteremia Last Day of Therapy:  04/19/18 Labs - Per HD Center 03/30/17 04/19/17  [name], MD    Family History Family History  Problem Relation Age of Onset  . Diabetes Mother   . Heart disease Mother   . Diabetes Brother     Social History Social History  Substance Use Topics  . Smoking status: Former Smoker    Quit date: 06/06/1979  . Smokeless tobacco: Former Systems developer  . Alcohol use No     Comment:   "years ago" , none now      Allergies   Shrimp [shellfish allergy]   Review of Systems Review of Systems ROS reviewed and all are negative for acute change except as noted in the HPI.  Physical Exam Updated Vital Signs BP (!) 142/65 (BP Location: Right Arm)   Pulse (!) 59   Temp 100.2 F (37.9 C) (Oral)   Resp 18   SpO2 100%   Physical Exam  Constitutional: He is oriented to person, place, and time. Vital signs are normal. He appears well-developed and well-nourished.  HENT:  Head: Normocephalic and atraumatic.  Right Ear: Hearing normal.  Left Ear: Hearing normal.  Eyes: Conjunctivae and EOM are normal. Pupils are equal, round, and reactive to  light.  Neck: Normal range of motion. Neck supple.  Cardiovascular: Normal rate, regular rhythm, normal heart sounds and intact distal pulses.   Pulmonary/Chest: Effort normal and breath sounds normal.  Abdominal: Soft. There is no tenderness.  Musculoskeletal: Normal range of motion.  Neurological: He is alert and oriented to person, place, and time.  Skin: Skin is warm and dry.  Psychiatric: He has a normal mood and affect. His speech is normal and behavior is normal. Thought content normal.  Nursing note and vitals reviewed.  ED Treatments / Results  Labs (all labs ordered are listed, but only abnormal results are displayed) Labs Reviewed  COMPREHENSIVE METABOLIC PANEL - Abnormal; Notable for the following:       Result Value   Sodium 132 (*)    Chloride 96 (*)    Glucose, Bld 113 (*)    BUN 44 (*)    Creatinine, Ser 11.26 (*)    Calcium 8.5 (*)    Albumin 2.9 (*)    ALT 15 (*)    GFR calc non Af Amer 4 (*)    GFR calc Af Amer 5 (*)    All other components within normal limits  CBC WITH DIFFERENTIAL/PLATELET - Abnormal; Notable for the following:    RBC 3.15 (*)    Hemoglobin 9.3 (*)    HCT 29.4 (*)    RDW 16.7 (*)    All other components within normal limits  URINALYSIS, ROUTINE W REFLEX MICROSCOPIC - Abnormal; Notable for the  following:    APPearance TURBID (*)    Hgb urine dipstick MODERATE (*)    Protein, ur 100 (*)    Leukocytes, UA LARGE (*)    Bacteria, UA MANY (*)    All other components within normal limits  CBG MONITORING, ED - Abnormal; Notable for the following:    Glucose-Capillary 107 (*)    All other components within normal limits  CULTURE, BLOOD (ROUTINE X 2)  CULTURE, BLOOD (ROUTINE X 2)  PROTIME-INR  I-STAT CG4 LACTIC ACID, ED  I-STAT CG4 LACTIC ACID, ED    EKG  EKG Interpretation None       Radiology Dg Chest 2 View  Result Date: 04/12/2017 CLINICAL DATA:  Pt and family member reports last dialysis was yesterday. Pt having fatigue and altered today "not acting like himself" and sleeping a lot. Reports fever and chills. Pt had dental extractions done this past Friday. Foley into yesterday. EXAM: CHEST  2 VIEW COMPARISON:  03/21/2017 FINDINGS: Heart is mildly enlarged. There are no focal consolidations or pleural effusions. Mild bibasilar atelectasis. No pulmonary edema. Electronic device overlies the chest. IMPRESSION: Cardiomegaly and mild bibasilar atelectasis. Electronically Signed   By: Nolon Nations M.D.   On: 04/12/2017 19:47    Procedures Procedures (including critical care time)  Medications Ordered in ED Medications  ceFEPIme (MAXIPIME) 2 g in dextrose 5 % 50 mL IVPB (not administered)     Initial Impression / Assessment and Plan / ED Course  I have reviewed the triage vital signs and the nursing notes.  Pertinent labs & imaging results that were available during my care of the patient were reviewed by me and considered in my medical decision making (see chart for details).  Final Clinical Impressions(s) / ED Diagnoses  {I have reviewed and evaluated the relevant laboratory values. {I have reviewed and evaluated the relevant imaging studies. {I have interpreted the relevant EKG. {I have reviewed the relevant previous healthcare records.  {I obtained HPI from  historian. {Patient discussed with supervising physician.  ED Course:  Assessment: Pt is a 66 y.o. male with hx CHF, ESRD on Dialysis MWF, DM, HTN, HLD, Morbid Obesity who presents with increased lethargy today with weakness and AMS per wife. Recent foley placement due to urinary retention from recent surgery that was removed on Monday. On exam, pt in Nontoxic/nonseptic appearing. VSS. Temp 101.13F. Lungs CTA. Heart RRR. Abdomen nontender soft. iStat Lactate 1.25. WBC 6.7. UA with evidence of UTI. Healthcare source due to foley. Blood cultures drawn. Given Cefepime abx to cover Urinary source. CXR unremarkable. Plan is to Admit to medicine due to increased lethargy and AMS.   Disposition/Plan:  Admit Pt acknowledges and agrees with plan  Supervising Physician Veryl Speak, MD  Final diagnoses:  Urinary tract infection without hematuria, site unspecified    New Prescriptions New Prescriptions   No medications on file     Shary Decamp, PA-C 04/12/17 [place], MD 04/12/17 2330

## 2017-04-13 ENCOUNTER — Observation Stay (HOSPITAL_COMMUNITY): Payer: Medicare Other

## 2017-04-13 DIAGNOSIS — I5032 Chronic diastolic (congestive) heart failure: Secondary | ICD-10-CM | POA: Diagnosis present

## 2017-04-13 DIAGNOSIS — E1122 Type 2 diabetes mellitus with diabetic chronic kidney disease: Secondary | ICD-10-CM

## 2017-04-13 DIAGNOSIS — I10 Essential (primary) hypertension: Secondary | ICD-10-CM

## 2017-04-13 DIAGNOSIS — R4182 Altered mental status, unspecified: Secondary | ICD-10-CM | POA: Diagnosis present

## 2017-04-13 DIAGNOSIS — Z87442 Personal history of urinary calculi: Secondary | ICD-10-CM | POA: Diagnosis not present

## 2017-04-13 DIAGNOSIS — G9340 Encephalopathy, unspecified: Secondary | ICD-10-CM | POA: Diagnosis not present

## 2017-04-13 DIAGNOSIS — Z992 Dependence on renal dialysis: Secondary | ICD-10-CM | POA: Diagnosis not present

## 2017-04-13 DIAGNOSIS — N390 Urinary tract infection, site not specified: Secondary | ICD-10-CM | POA: Diagnosis present

## 2017-04-13 DIAGNOSIS — K219 Gastro-esophageal reflux disease without esophagitis: Secondary | ICD-10-CM | POA: Diagnosis present

## 2017-04-13 DIAGNOSIS — Z833 Family history of diabetes mellitus: Secondary | ICD-10-CM | POA: Diagnosis not present

## 2017-04-13 DIAGNOSIS — N186 End stage renal disease: Secondary | ICD-10-CM

## 2017-04-13 DIAGNOSIS — N185 Chronic kidney disease, stage 5: Secondary | ICD-10-CM | POA: Diagnosis not present

## 2017-04-13 DIAGNOSIS — N5089 Other specified disorders of the male genital organs: Secondary | ICD-10-CM | POA: Diagnosis not present

## 2017-04-13 DIAGNOSIS — E8889 Other specified metabolic disorders: Secondary | ICD-10-CM | POA: Diagnosis present

## 2017-04-13 DIAGNOSIS — N2581 Secondary hyperparathyroidism of renal origin: Secondary | ICD-10-CM | POA: Diagnosis present

## 2017-04-13 DIAGNOSIS — K449 Diaphragmatic hernia without obstruction or gangrene: Secondary | ICD-10-CM | POA: Diagnosis present

## 2017-04-13 DIAGNOSIS — I890 Lymphedema, not elsewhere classified: Secondary | ICD-10-CM | POA: Diagnosis present

## 2017-04-13 DIAGNOSIS — I480 Paroxysmal atrial fibrillation: Secondary | ICD-10-CM | POA: Diagnosis present

## 2017-04-13 DIAGNOSIS — R509 Fever, unspecified: Secondary | ICD-10-CM | POA: Diagnosis not present

## 2017-04-13 DIAGNOSIS — E113599 Type 2 diabetes mellitus with proliferative diabetic retinopathy without macular edema, unspecified eye: Secondary | ICD-10-CM | POA: Diagnosis present

## 2017-04-13 DIAGNOSIS — I132 Hypertensive heart and chronic kidney disease with heart failure and with stage 5 chronic kidney disease, or end stage renal disease: Secondary | ICD-10-CM | POA: Diagnosis present

## 2017-04-13 DIAGNOSIS — R7881 Bacteremia: Secondary | ICD-10-CM | POA: Diagnosis not present

## 2017-04-13 DIAGNOSIS — E1151 Type 2 diabetes mellitus with diabetic peripheral angiopathy without gangrene: Secondary | ICD-10-CM | POA: Diagnosis present

## 2017-04-13 DIAGNOSIS — Z89422 Acquired absence of other left toe(s): Secondary | ICD-10-CM | POA: Diagnosis not present

## 2017-04-13 DIAGNOSIS — D638 Anemia in other chronic diseases classified elsewhere: Secondary | ICD-10-CM | POA: Diagnosis present

## 2017-04-13 DIAGNOSIS — D472 Monoclonal gammopathy: Secondary | ICD-10-CM | POA: Diagnosis present

## 2017-04-13 DIAGNOSIS — N4889 Other specified disorders of penis: Secondary | ICD-10-CM | POA: Diagnosis present

## 2017-04-13 DIAGNOSIS — E785 Hyperlipidemia, unspecified: Secondary | ICD-10-CM | POA: Diagnosis present

## 2017-04-13 DIAGNOSIS — I872 Venous insufficiency (chronic) (peripheral): Secondary | ICD-10-CM | POA: Diagnosis present

## 2017-04-13 DIAGNOSIS — N3020 Other chronic cystitis without hematuria: Secondary | ICD-10-CM | POA: Diagnosis present

## 2017-04-13 LAB — PHOSPHORUS: Phosphorus: 5.5 mg/dL — ABNORMAL HIGH (ref 2.5–4.6)

## 2017-04-13 LAB — VITAMIN B12: VITAMIN B 12: 466 pg/mL (ref 180–914)

## 2017-04-13 LAB — RENAL FUNCTION PANEL
ALBUMIN: 2.8 g/dL — AB (ref 3.5–5.0)
ANION GAP: 15 (ref 5–15)
BUN: 49 mg/dL — ABNORMAL HIGH (ref 6–20)
CO2: 23 mmol/L (ref 22–32)
Calcium: 8.6 mg/dL — ABNORMAL LOW (ref 8.9–10.3)
Chloride: 95 mmol/L — ABNORMAL LOW (ref 101–111)
Creatinine, Ser: 12.16 mg/dL — ABNORMAL HIGH (ref 0.61–1.24)
GFR calc Af Amer: 4 mL/min — ABNORMAL LOW (ref >60)
GFR, EST NON AFRICAN AMERICAN: 4 mL/min — AB (ref >60)
Glucose, Bld: 104 mg/dL — ABNORMAL HIGH (ref 65–99)
PHOSPHORUS: 5.5 mg/dL — AB (ref 2.5–4.6)
POTASSIUM: 4.1 mmol/L (ref 3.5–5.1)
Sodium: 133 mmol/L — ABNORMAL LOW (ref 135–145)

## 2017-04-13 LAB — IRON AND TIBC
IRON: 18 ug/dL — AB (ref 45–182)
Saturation Ratios: 10 % — ABNORMAL LOW (ref 17.9–39.5)
TIBC: 189 ug/dL — AB (ref 250–450)
UIBC: 171 ug/dL

## 2017-04-13 LAB — CREATININE, SERUM
Creatinine, Ser: 12.14 mg/dL — ABNORMAL HIGH (ref 0.61–1.24)
GFR calc non Af Amer: 4 mL/min — ABNORMAL LOW (ref >60)
GFR, EST AFRICAN AMERICAN: 4 mL/min — AB (ref >60)

## 2017-04-13 LAB — CBC
HEMATOCRIT: 27.9 % — AB (ref 39.0–52.0)
Hemoglobin: 8.8 g/dL — ABNORMAL LOW (ref 13.0–17.0)
MCH: 29.3 pg (ref 26.0–34.0)
MCHC: 31.5 g/dL (ref 30.0–36.0)
MCV: 93 fL (ref 78.0–100.0)
Platelets: 168 10*3/uL (ref 150–400)
RBC: 3 MIL/uL — ABNORMAL LOW (ref 4.22–5.81)
RDW: 16.8 % — AB (ref 11.5–15.5)
WBC: 6.7 10*3/uL (ref 4.0–10.5)

## 2017-04-13 LAB — RAPID URINE DRUG SCREEN, HOSP PERFORMED
AMPHETAMINES: NOT DETECTED
BENZODIAZEPINES: NOT DETECTED
Barbiturates: NOT DETECTED
Cocaine: NOT DETECTED
OPIATES: NOT DETECTED
Tetrahydrocannabinol: NOT DETECTED

## 2017-04-13 LAB — FOLATE: Folate: 9.9 ng/mL (ref >5.9)

## 2017-04-13 LAB — RETICULOCYTES
RBC.: 3 MIL/uL — ABNORMAL LOW (ref 4.22–5.81)
Retic Count, Absolute: 54 10*3/uL (ref 19.0–186.0)
Retic Ct Pct: 1.8 % (ref 0.4–3.1)

## 2017-04-13 LAB — GLUCOSE, CAPILLARY
GLUCOSE-CAPILLARY: 86 mg/dL (ref 65–99)
GLUCOSE-CAPILLARY: 103 mg/dL — AB (ref 65–99)
Glucose-Capillary: 141 mg/dL — ABNORMAL HIGH (ref 65–99)

## 2017-04-13 LAB — AMMONIA: Ammonia: 18 umol/L (ref 9–35)

## 2017-04-13 LAB — I-STAT CG4 LACTIC ACID, ED: Lactic Acid, Venous: 0.55 mmol/L (ref 0.5–1.9)

## 2017-04-13 LAB — FERRITIN: Ferritin: 1002 ng/mL — ABNORMAL HIGH (ref 24–336)

## 2017-04-13 LAB — MRSA PCR SCREENING: MRSA BY PCR: NEGATIVE

## 2017-04-13 LAB — VANCOMYCIN, RANDOM: VANCOMYCIN RM: 19

## 2017-04-13 LAB — MAGNESIUM: Magnesium: 2 mg/dL (ref 1.7–2.4)

## 2017-04-13 MED ORDER — HEPARIN SODIUM (PORCINE) 1000 UNIT/ML DIALYSIS: 1000.0000 [IU] | INTRAMUSCULAR | Status: DC | PRN

## 2017-04-13 MED ORDER — LIDOCAINE HCL (PF) 1 % IJ SOLN: 5.0000 mL | INTRAMUSCULAR | Status: DC | PRN

## 2017-04-13 MED ORDER — SODIUM CHLORIDE 0.9 % IV SOLN: 100.0000 mL | INTRAVENOUS | Status: DC | PRN

## 2017-04-13 MED ORDER — ALTEPLASE 2 MG IJ SOLR: 2.0000 mg | Freq: Once | INTRAMUSCULAR | Status: DC | PRN

## 2017-04-13 MED ORDER — LIDOCAINE-PRILOCAINE 2.5-2.5 % EX CREA: 1.0000 "application " | TOPICAL_CREAM | CUTANEOUS | Status: DC | PRN

## 2017-04-13 MED ORDER — CLONIDINE HCL 0.1 MG/24HR TD PTWK: 0.1000 mg | MEDICATED_PATCH | TRANSDERMAL | Status: DC

## 2017-04-13 MED ORDER — LIDOCAINE HCL (PF) 1 % IJ SOLN
5.0000 mL | INTRAMUSCULAR | Status: DC | PRN
  Filled 2017-04-13: qty 5

## 2017-04-13 MED ORDER — PENTAFLUOROPROP-TETRAFLUOROETH EX AERO: 1.0000 "application " | INHALATION_SPRAY | CUTANEOUS | Status: DC | PRN

## 2017-04-13 MED ORDER — HEPARIN SODIUM (PORCINE) 1000 UNIT/ML DIALYSIS: 6000.0000 [IU] | Freq: Once | INTRAMUSCULAR | Status: DC

## 2017-04-13 MED ORDER — PIPERACILLIN-TAZOBACTAM 3.375 G IVPB
3.3750 g | Freq: Two times a day (BID) | INTRAVENOUS | Status: DC
  Administered 2017-04-13 – 2017-04-14 (×3): 3.375 g via INTRAVENOUS
  Filled 2017-04-13 (×3): qty 50

## 2017-04-13 MED ORDER — INSULIN ASPART 100 UNIT/ML ~~LOC~~ SOLN
0.0000 [IU] | Freq: Three times a day (TID) | SUBCUTANEOUS | Status: DC
  Administered 2017-04-14 (×2): 2 [IU] via SUBCUTANEOUS
  Administered 2017-04-15 (×2): 1 [IU] via SUBCUTANEOUS

## 2017-04-13 MED ORDER — HEPARIN SODIUM (PORCINE) 5000 UNIT/ML IJ SOLN
5000.0000 [IU] | Freq: Three times a day (TID) | INTRAMUSCULAR | Status: DC
  Administered 2017-04-13 – 2017-04-15 (×6): 5000 [IU] via SUBCUTANEOUS
  Filled 2017-04-13 (×4): qty 1

## 2017-04-13 MED ORDER — SODIUM CHLORIDE 0.9 % IV SOLN
1250.0000 mg | INTRAVENOUS | Status: DC
  Administered 2017-04-15: 1250 mg via INTRAVENOUS
  Filled 2017-04-13 (×5): qty 1250

## 2017-04-13 MED ORDER — CARVEDILOL 6.25 MG PO TABS
6.2500 mg | ORAL_TABLET | Freq: Two times a day (BID) | ORAL | Status: DC
  Administered 2017-04-13 – 2017-04-15 (×5): 6.25 mg via ORAL
  Filled 2017-04-13 (×5): qty 1

## 2017-04-13 MED ORDER — HEPARIN SODIUM (PORCINE) 1000 UNIT/ML DIALYSIS
6000.0000 [IU] | Freq: Once | INTRAMUSCULAR | Status: DC
  Filled 2017-04-13: qty 6

## 2017-04-13 MED ORDER — CALCITRIOL 0.5 MCG PO CAPS
1.7500 ug | ORAL_CAPSULE | ORAL | Status: DC
  Administered 2017-04-15: 1.75 ug via ORAL

## 2017-04-13 NOTE — H&P (Signed)
[facility] Admission History and Physical Service Pager: [phone]  Patient name: [name] Medical record number: [id number] Date of birth: 01-Apr-1951 Age: 66 y.o. Gender: male  Primary Care Provider: Georges Lynch, MD Consultants: Nephrology  Code Status: Full (obtained on admission)  Chief Complaint: Altered mental status and "lethargy"  Assessment and Plan: [name] is a 66 y.o. male presenting with AMS and letheragy. PMH is significant for ESRD on HD TTSa, Rothia bacteremia (on Vanc with HD 4/10>), ? Endocarditis, MGUS, HTN, DM, Thrombocytopenia, PAF, Diastolic CHF, lymphedema, venous insufficiency, chronic periodontitis, dental caries and multiple loculation status post dental extraction.   Fatigue/AMS: these are improving. He is still a little bit sluggish but awake and oriented 4. Was able to get out of the bed on his own and urinate in his urinal. Neuro exam is negative for focal deficit so doubt CVA. Possible causes are urosepsis and opiates. Chills, fever and and dirty UA concerning for pyelonephritis/urosepsis. He has no CVA tenderness. He is hemodynamically stable. He had bacteremia at his recent admission for which he is treated with vancomycin. He has recent dental extraction which could put him at risk for another bacteremia. However, he has no leukocytosis or lactic acidosis. He has no stigmata of endocarditis. He was also on Percocet for pain after dental extraction. He may not be able to clear this well due to ESRD. However, no pinpoint pupils. No photophobia or meningeal signs to think of meningitis. He denies drug use. Unlikely uremia with BUN of 44. He has no asterixis or other symptoms of uremia. CBG was 107. Blood and urine culture drawn in ED.  Received cefepime in ED.  -Admit to MedSurg. Attending Dr. Ree Kida -Continue cardiac monitoring -We will continue vancomycin with HD -We will switch cefepime to Zosyn for broader  coverage -Follow cultures -CBC in the morning -UDS, ammonia level, carboxyhemoglobin -Renal ultrasound  Rothia bacteremia: Patient was hospitalized 4/10-417 for this. Discharged on vancomycin with HD -Continue vancomycin with HD  ESRD: Still makes some urine. On HD TTS. Last HD was 5/1. He get vancomycin with HD -Touch with his nephro in the morning. Need dialysis 5/3 - Chek phosphorus and magnesium  Periodontitis/dental caries status post dental extraction on 4/27. No signs of abscess or inflammation - Antibiotic as above - Patient has follow-up with his dentist  Hypertension: SBP in the range of 140s to 160s.  -Continue on clonidine and Coreg -Holding home Amlodipine and Lasix  PAF: NSR now. CHAD-Vasc score 4-5. Cardiology recommended a 30 day event at his recent admission. They did not recommend for him to be on anticoagulation at this time. -Continue his home aspirin -Continue Coreg -Cardiac monitoring  Diastolic HF: Echocardiogram on 03/24/2017 with EF of 55-60% and G2DD. On Lasix 80 mg twice a day at home -We will resume his Lasix in the morning  ACD: Hgb 9.3 (baseline 9-10). Last iron study in Epic in 2016 unless he had another one at HD center -Anemia panel  DM 2: Last A1c 6.8 on 03/13/2017. Not on any medication -SSI-renal -Monitor CBG before meals and at bedtime  MGUS: Appears stable per hem-onc at his recent admission.  -May need outpatient oncology follow-up  Lymphedema: As an Ace wrap or on his legs bilaterally. On Lasix 80 mg twice a day at home -We will resume his Lasix in the morning  Disposition:  home pending clinical improvement  History of Present Illness:  [name] is a 66 y.o. male  presenting with fatigue and altered mental status.   Patient reports having dental extraction for chronic periodontitis and dental caries about 6 days ago (Friday). He states he had 21 teeth pulled out and discharged home on Percocet for pain. Then he  developed urinary retention and presented to ED the same day. He had a Foley placed in ED and discharged home to follow-up with PCP. Foley was removed on Monday (about 3 days ago). He was able to void normally since then. He denies dysuria or hematuria. He is ESRD but still makes some urine. Yesterday about noon, he he couldn't get out of his car. He was weak and "lethargic". So his wife was concerned and brought him to ED. Patient reports chills. He denies fever but he was febrile on arrival to ED. He admits mild headache but denies vision changes, photophobia, neck stiffness, shortness of breath, chest pain, nausea, vomiting, diarrhea, abdominal pain, dysuria, hematuria, back pain, focal weakness, numbness or tingling or skin lesion or wound. Admits leg swelling due to his chronic lymphedema.   He denies new medication except Percocet he was given by his dentist. He denies recreational drug use or drinking alcohol.  Off note, patient was recently hospitalized from 4/10-4/17 for Rothia bacteremia and was discharged on IV vancomycin with HD for a total of 4 weeks (4/10>). TEE was unsuccessful at that admission  Review Of Systems:  Review of systems negative except for pertinent positives and negatives in history of present illness above.   ROS  Patient Active Problem List   Diagnosis Date Noted  . Acute encephalopathy 04/12/2017  . Urinary retention 04/10/2017  . Bacteremia   . RUQ abdominal pain   . Gross hematuria   . Other pancytopenia ([place])   . Sepsis ([place]) 03/21/2017  . Diarrhea 03/03/2017  . Venous stasis ulcers of both lower extremities ([place]) 08/07/2015  . Varicose veins of lower extremities with complications 58/52/7782  . Malfunction of arteriovenous dialysis fistula ([place])   . CHF (congestive heart failure) ([place]) 06/23/2015  . Lymphedema distichiasis syndrome with kidney disease and diabetes mellitus ([place]) 04/06/2015  . Lymphedema of lower extremity 04/04/2015  . DM (diabetes  mellitus), type 2 with renal complications ([place]) 42/35/3614  . MGUS (monoclonal gammopathy of unknown significance) 06/12/2014  . Special screening for malignant neoplasms, colon 07/09/2013  . Proliferative diabetic retinopathy ([place]) 10/23/2012  . Knee pain 08/21/2012  . Mass of thigh 08/21/2012  . ORGANIC IMPOTENCE 12/02/2009  . ONYCHOMYCOSIS 07/17/2009  . ESRD (end stage renal disease) on dialysis ([place]) 06/09/2008  . Hyperlipidemia 03/19/2007  . OBESITY, MORBID 02/08/2007  . HYPERTENSION, BENIGN SYSTEMIC 02/08/2007  . Atherosclerosis of native arteries of extremity with intermittent claudication ([place]) 02/08/2007    Past Medical History: Past Medical History:  Diagnosis Date  . Arthritis   . Asthma    as a child  . CHF (congestive heart failure) ([place])   . Chronic cystitis   . Chronic kidney disease   . Diabetes mellitus   . Dry skin   . Dyspnea   . Dysrhythmia   . GERD (gastroesophageal reflux disease)   . H/O hiatal hernia    states it's been fixed  . Hyperlipidemia   . Hypertension   . Lymphedema   . Morbid obesity ([place])   . NEPHROLITHIASIS, HX OF 12/02/2009   Qualifier: Diagnosis of  By: [name], Cat    . PVD (peripheral vascular disease) ([place])   . Renal insufficiency   . UMBILICAL HERNIA 4/31/5400  Qualifier: History of  By: [name] MD, Audelia Acton    . Venous insufficiency     Past Surgical History: Past Surgical History:  Procedure Laterality Date  . AMPUTATION     Right and left fifth toes.   . AMPUTATION TOE     emoval of both little toes  . AV FISTULA PLACEMENT Left 03/21/2014   Procedure: ARTERIOVENOUS (AV) FISTULA CREATION with ultrasound;  Surgeon: [name], MD;  Location: [facility];  Service: Vascular;  Laterality: Left;  . COLONOSCOPY    . EYE SURGERY Bilateral    cataract and lens implant  . HERNIA REPAIR    . LIGATION OF COMPETING BRANCHES OF ARTERIOVENOUS FISTULA Left 06/29/2015   Procedure: LIGATION OF LEFT ARM RADIOCEPHALIC ARTERIOVENOUS FISTULA  SIDE BRANCHES;  Surgeon: Conrad [place], MD;  Location: [facility];  Service: Vascular;  Laterality: Left;  [name] MULTIPLE EXTRACTIONS WITH ALVEOLOPLASTY N/A 04/07/2017   Procedure: Extraction of tooth #'s 1-11, 13, 14,16, 20-23, and 26-28 with alveoloplasty;  Surgeon: Lenn Cal, DDS;  Location: [facility];  Service: Oral Surgery;  Laterality: N/A;  . Popliteal to posterior tibial bypass     2006  . R knee arthoscopic repair of meniscus    . UMBILICAL HERNIA REPAIR      Social History: Social History  Substance Use Topics  . Smoking status: Former Smoker    Quit date: 06/06/1979  . Smokeless tobacco: Former Systems developer  . Alcohol use No     Comment:   "years ago" , none now   Additional social history: Denies smoking cigarettes, drinking alcohol or recreational drug use. Lives with his wife was at the bedside.   Please also refer to relevant sections of EMR.  Family History: Family History  Problem Relation Age of Onset  . Diabetes Mother   . Heart disease Mother   . Diabetes Brother     Allergies and Medications: Allergies  Allergen Reactions  . Shrimp [Shellfish Allergy] Swelling   No current facility-administered medications on file prior to encounter.    Current Outpatient Prescriptions on File Prior to Encounter  Medication Sig Dispense Refill  . amLODipine (NORVASC) 10 MG tablet Take 10 mg by mouth every morning.    [name] aspirin EC 81 MG tablet Take 81 mg by mouth every morning.    . calcium acetate (PHOSLO) 667 MG capsule Take 2,001-2,668 mg by mouth See admin instructions. Takes 4 capsules three times a day with meals and 3 capsules with each snacks.    . carvedilol (COREG) 6.25 MG tablet Take 1 tablet (6.25 mg total) by mouth 2 (two) times daily with a meal. 60 tablet 1  . cloNIDine (CATAPRES - DOSED IN MG/24 HR) 0.1 mg/24hr patch Place 1 patch (0.1 mg total) onto the skin once a week. 4 patch 5  . furosemide (LASIX) 80 MG tablet Take 1 tablet (80 mg total) by mouth 2 (two) times  daily. 60 tablet 2  . oxyCODONE-acetaminophen (PERCOCET) 5-325 MG tablet Take one or two tablets by mouth every 6 hours as needed for pain. 40 tablet 0  . pantoprazole (PROTONIX) 40 MG tablet Take 40 mg by mouth daily.  0  . urea 10 % lotion Apply topically 2 (two) times daily. (Patient taking differently: Apply 1 application topically daily. ) 237 mL 0  . vancomycin IVPB Inject 1,250 mg into the vein Every Tuesday,Thursday,and Saturday with dialysis. Indication:  bacteremia Last Day of Therapy:  04/19/18 Labs - Per HD Center 9 Units  0    Objective: BP (!) 152/59   Pulse 60   Temp 100.2 F (37.9 C) (Oral)   Resp 18   SpO2 98%  Exam: GEN: appears well, no apparent distress. He is a little bit sluggish but responds appropriately to questions Head: normocephalic and atraumatic  Eyes: conjunctiva without injection, sclera anicteric, no photophobia, no pinpoint pupils. Positive for arcus senilis Oropharynx: mmm without erythema or exudation. No dentition. Neck: No nuchal rigidity. No lymphadenopathy CVS: RRR, nl s1 & s2, 1/6 SEM over RUSB and LUSB. AVF in left arm with thrills and bruits. RESP: speaks in full sentence, no IWOB, good air movement bilaterally, CTAB GI: BS present & normal, soft, NTND, no guarding, no rebound, no mass GU: no suprapubic or CVA tenderness MSK: no focal tenderness. Had bilateral lymphedema. Ace wrap in place SKIN: no apparent skin lesion.  NEURO: Slightly sluggish but awake and oriented 4. Responds to question appropriately. CN II-12 intact. Motor 5/5 in all extremities. Was able to stand at bedside and urinate.  PSYCH: euthymic mood with congruent affect  Labs and Imaging: CBC BMET   Recent Labs Lab 04/12/17 1852  WBC 6.7  HGB 9.3*  HCT 29.4*  PLT 165    Recent Labs Lab 04/12/17 1852  NA 132*  K 4.4  CL 96*  CO2 23  BUN 44*  CREATININE 11.26*  GLUCOSE 113*  CALCIUM 8.5*    Dg Chest 2 View  Result Date: 04/12/2017 CLINICAL DATA:  Pt and  family member reports last dialysis was yesterday. Pt having fatigue and altered today "not acting like himself" and sleeping a lot. Reports fever and chills. Pt had dental extractions done this past Friday. Foley into yesterday. EXAM: CHEST  2 VIEW COMPARISON:  03/21/2017 FINDINGS: Heart is mildly enlarged. There are no focal consolidations or pleural effusions. Mild bibasilar atelectasis. No pulmonary edema. Electronic device overlies the chest. IMPRESSION: Cardiomegaly and mild bibasilar atelectasis. Electronically Signed   By: Nolon Nations M.D.   On: 04/12/2017 19:47    Mercy Riding, MD 04/13/2017, 1:13 AM PGY-2, [facility] pager: [phone], text pages welcome

## 2017-04-13 NOTE — Progress Notes (Signed)
FPTS Interim Note  Patient reported to RN that today was the day he would normally change his Unna boots. Went to visit Mr. Shives as the ortho tech was called to replace Unna boots. On further evaluation, patient had kerlix and gauze from ankles to knees rather than Unna boots. On my evaluation, bilateral lower legs had extensive woody venous stasis changes, but minimal swelling. No drainage, open wounds, on signs of infection. Asked ortho tech to replace dressings as patient had previously been told to keep these on by Dr. Oneida Alar.   Ralene Ok, MD

## 2017-04-13 NOTE — Progress Notes (Signed)
PCP Social Visit Note [facility] [name], MD,MS, PGY-3  I have seen patient and discussed current hospitalization. I agree with the excellent care provided by the primary team of [facility] and want to thank them for their continued efforts in caring for my patient.   Georges Lynch, MD,MS, PGY-3 04/13/2017 2:28 PM

## 2017-04-13 NOTE — Progress Notes (Signed)
Pharmacy Antibiotic Note  [name] is a 66 y.o. male admitted on 04/12/2017 with AMS.  Pharmacy has been consulted for continued Vancomycin dosing (Pt has been on Vanc since 4/10 for Rothia bacteremia/?endocarditis - supposed to end 5/9). Adding Zosyn for urosepsis.  Home Vanc dose 1250mg  IV with HD (T/T/S). Pre-HD vanc level this morning 19 mcg/ml (therapeutic for goal 15-25 mcg/ml)  Plan: Zosyn 3.375gm IV q12h - doses over 4 hours Continue Vancomycin 1250mg  IV with HD Will f/u micro data, HD tolerance/schedule, and pt's clinical condition Pre-HD Vanc level prn   Weight: 269 lb 1.6 oz (122.1 kg)  Temp (24hrs), Avg:100 F (37.8 C), Min:99 F (37.2 C), Max:101.8 F (38.8 C)   Recent Labs Lab 04/07/17 2228 04/12/17 1852 04/12/17 1922 04/13/17 0110 04/13/17 0507  WBC 12.8* 6.7  --   --  6.7  CREATININE 9.62* 11.26*  --   --  12.14*  12.16*  LATICACIDVEN  --   --  1.25 0.55  --   VANCORANDOM  --   --   --   --  19    Estimated Creatinine Clearance: 7.7 mL/min (A) (by C-G formula based on SCr of 12.14 mg/dL (H)).    Allergies  Allergen Reactions  . Shrimp [Shellfish Allergy] Swelling    Antimicrobials this admission: 4/10 Vanc >>  5/3 Zosyn >>   Dose adjustments this admission: n/a  Microbiology results: 5/2 BCx x2:   Thank you for allowing pharmacy to be a part of this patient's care.  Sherlon Handing, PharmD, BCPS Clinical pharmacist, pager [phone] 04/13/2017 6:42 AM

## 2017-04-13 NOTE — Progress Notes (Addendum)
  Dialysis Orders: [place] T[place]Th,S  4 hrs 180 NRe 450/800 118 kg 2.0 K/2.25 Ca  UF Profile 4 Heparin: 6000 units IV TIW Mircera 100 mg IV q 2 weeks (Last dose 04/11/17 Last HGB 9.6 Ferritin 826 Fe 56 Tsat 24% 04/06/17) Calcitriol 1.75 mcg PO TIW (last PTH 369 04/06/17)  BMD meds:  Calcium Acetate 667mg  4-6 capsules PO TID AC 4 capsules w/snack  [name] is 66 Y/O patient with ESRD on hemodialysis T,Th,S. He has been admitted as OBSERVATION PATIENT for UTI sx/AMS. Mental status is currently back to base line. H/O rothia bacteria-has been receiving vancomycin in HD unit-had rec'd 5 doses since DC. We will enter HD orders to patient today. Please notify us if patient status upgraded to in-patient and we will consult formally.  Juanell Fairly NP-C [place] [phone] (pager)  Pt seen, examined and agree w A/P as above.  [name] MD [employer] pager [phone]   04/13/2017, 2:14 PM

## 2017-04-13 NOTE — ED Notes (Signed)
IV Team at bedside 

## 2017-04-13 NOTE — Progress Notes (Signed)
Orthopedic Tech Progress Note Patient Details:  [name] [birth year] [id number] rewrapped lower extremities did not apply unna boots per doctor request. Ortho Devices Type of Ortho Device: Ace wrap Ortho Device/Splint Location: (B) LE Ortho Device/Splint Interventions: Ordered, Application   Braulio Bosch 04/13/2017, 5:27 PM

## 2017-04-13 NOTE — Progress Notes (Signed)
FPTS Interim Progress Note  S: Patient feels well, answers questions appropriately.   O: BP (!) 130/58 (BP Location: Right Arm)   Pulse (!) 57   Temp 99.3 F (37.4 C) (Oral)   Resp 16   Wt 269 lb 1.6 oz (122.1 kg)   SpO2 97%   BMI 37.53 kg/m   Gen: obese male lying in bed in NAD.  CV: RRR, no murmur Resp: CTAB, easy WOB Abd: soft, NTND, +BS  Extremities: Bilateral LEs wrapped in unnaboots  A/P: Encephalopathy - resolved ESRD - scheduled for HD today HTN - continue current meds PAF - no anticoagulation for now Anemia - anemia panel ordered  Sela Hilding, MD 04/13/2017, 11:40 AM PGY-1, [place] [facility] pager [phone]

## 2017-04-13 NOTE — Progress Notes (Signed)
Patients una boots were due to be changed today. MD notified. Orders placed. Will continue to monitor.

## 2017-04-14 DIAGNOSIS — N5089 Other specified disorders of the male genital organs: Secondary | ICD-10-CM

## 2017-04-14 LAB — BASIC METABOLIC PANEL
ANION GAP: 11 (ref 5–15)
BUN: 28 mg/dL — AB (ref 6–20)
CO2: 28 mmol/L (ref 22–32)
Calcium: 8.6 mg/dL — ABNORMAL LOW (ref 8.9–10.3)
Chloride: 94 mmol/L — ABNORMAL LOW (ref 101–111)
Creatinine, Ser: 9.23 mg/dL — ABNORMAL HIGH (ref 0.61–1.24)
GFR, EST AFRICAN AMERICAN: 6 mL/min — AB (ref >60)
GFR, EST NON AFRICAN AMERICAN: 5 mL/min — AB (ref >60)
Glucose, Bld: 132 mg/dL — ABNORMAL HIGH (ref 65–99)
POTASSIUM: 3.9 mmol/L (ref 3.5–5.1)
SODIUM: 133 mmol/L — AB (ref 135–145)

## 2017-04-14 LAB — CBC
HEMATOCRIT: 28.4 % — AB (ref 39.0–52.0)
Hemoglobin: 9.2 g/dL — ABNORMAL LOW (ref 13.0–17.0)
MCH: 30.2 pg (ref 26.0–34.0)
MCHC: 32.4 g/dL (ref 30.0–36.0)
MCV: 93.1 fL (ref 78.0–100.0)
PLATELETS: 185 10*3/uL (ref 150–400)
RBC: 3.05 MIL/uL — ABNORMAL LOW (ref 4.22–5.81)
RDW: 17.1 % — ABNORMAL HIGH (ref 11.5–15.5)
WBC: 10.2 10*3/uL (ref 4.0–10.5)

## 2017-04-14 LAB — URINE CULTURE

## 2017-04-14 LAB — GLUCOSE, CAPILLARY
GLUCOSE-CAPILLARY: 122 mg/dL — AB (ref 65–99)
GLUCOSE-CAPILLARY: 144 mg/dL — AB (ref 65–99)
Glucose-Capillary: 157 mg/dL — ABNORMAL HIGH (ref 65–99)
Glucose-Capillary: 171 mg/dL — ABNORMAL HIGH (ref 65–99)

## 2017-04-14 MED ORDER — ACETAMINOPHEN 325 MG PO TABS
650.0000 mg | ORAL_TABLET | Freq: Four times a day (QID) | ORAL | Status: DC | PRN
  Administered 2017-04-14 – 2017-04-15 (×3): 650 mg via ORAL
  Filled 2017-04-14 (×3): qty 2

## 2017-04-14 MED ORDER — CALCIUM ACETATE (PHOS BINDER) 667 MG PO CAPS
2001.0000 mg | ORAL_CAPSULE | Freq: Three times a day (TID) | ORAL | Status: DC
  Administered 2017-04-14 – 2017-04-15 (×3): 2001 mg via ORAL
  Filled 2017-04-14 (×3): qty 3

## 2017-04-14 MED ORDER — FUROSEMIDE 80 MG PO TABS
80.0000 mg | ORAL_TABLET | Freq: Two times a day (BID) | ORAL | Status: DC
  Administered 2017-04-14 – 2017-04-15 (×4): 80 mg via ORAL
  Filled 2017-04-14 (×4): qty 1

## 2017-04-14 MED ORDER — PRO-STAT SUGAR FREE PO LIQD
30.0000 mL | Freq: Two times a day (BID) | ORAL | Status: DC
  Administered 2017-04-14 (×2): 30 mL via ORAL
  Filled 2017-04-14 (×2): qty 30

## 2017-04-14 MED ORDER — ACETAMINOPHEN 325 MG PO TABS
650.0000 mg | ORAL_TABLET | Freq: Once | ORAL | Status: AC
  Administered 2017-04-14: 650 mg via ORAL
  Filled 2017-04-14: qty 2

## 2017-04-14 MED ORDER — VANCOMYCIN HCL IN DEXTROSE 1-5 GM/200ML-% IV SOLN
1000.0000 mg | Freq: Once | INTRAVENOUS | Status: AC
  Administered 2017-04-14: 1000 mg via INTRAVENOUS
  Filled 2017-04-14: qty 200

## 2017-04-14 NOTE — Consult Note (Signed)
[facility] Renal Consultation Note    Indication for Consultation:  Management of ESRD/hemodialysis, anemia, hypertension/volume, and secondary hyperparathyroidism. PCP:  HPI: [name] is a 66 y.o. male with ESRD, Type 2 DM, HTN, GERD, chronic lymphadema, MGUS who was initially admitted under observation status with AMS and lethargy, now changed to full admission.   S/p recent admit with Rothia bacteremia with possible IE, still finishing course of Vanc. Had teeth pulled last Friday. Later in the day, he noted urinary retention and had foley placed in ED. The foley was removed on Monday. Since that time, he has noted pain and edema in his penis. On day of admission, he was noted to be extremely lethargic and altered per family. Brought to ED, where now being treated for UTI/?sepsis. BCx drawn and pending. Zosyn was added to current abx regimen. AMS improved.   Dialyzed yesterday 5/3. No CP or dyspnea today; no N/V/D. Only concern is penile swelling and pain. Next due for HD on 5/5. No issues with L AVF.  Past Medical History:  Diagnosis Date  . Arthritis   . Asthma    as a child  . CHF (congestive heart failure) ([place])   . Chronic cystitis   . Chronic kidney disease   . Diabetes mellitus   . Dry skin   . Dyspnea   . Dysrhythmia   . GERD (gastroesophageal reflux disease)   . H/O hiatal hernia    states it's been fixed  . Hyperlipidemia   . Hypertension   . Lymphedema   . Morbid obesity ([place])   . NEPHROLITHIASIS, HX OF 12/02/2009   Qualifier: Diagnosis of  By: [name], Cat    . PVD (peripheral vascular disease) ([place])   . Renal insufficiency   . UMBILICAL HERNIA 2/99/2426   Qualifier: History of  By: [name] MD, Audelia Acton    . Venous insufficiency    Past Surgical History:  Procedure Laterality Date  . AMPUTATION     Right and left fifth toes.   . AMPUTATION TOE     emoval of both little toes  . AV FISTULA PLACEMENT Left 03/21/2014   Procedure: ARTERIOVENOUS  (AV) FISTULA CREATION with ultrasound;  Surgeon: [name], MD;  Location: [facility];  Service: Vascular;  Laterality: Left;  . COLONOSCOPY    . EYE SURGERY Bilateral    cataract and lens implant  . HERNIA REPAIR    . LIGATION OF COMPETING BRANCHES OF ARTERIOVENOUS FISTULA Left 06/29/2015   Procedure: LIGATION OF LEFT ARM RADIOCEPHALIC ARTERIOVENOUS FISTULA SIDE BRANCHES;  Surgeon: Conrad Bayview, MD;  Location: [facility];  Service: Vascular;  Laterality: Left;  [name] MULTIPLE EXTRACTIONS WITH ALVEOLOPLASTY N/A 04/07/2017   Procedure: Extraction of tooth #'s 1-11, 13, 14,16, 20-23, and 26-28 with alveoloplasty;  Surgeon: Lenn Cal, DDS;  Location: [facility];  Service: Oral Surgery;  Laterality: N/A;  . Popliteal to posterior tibial bypass     2006  . R knee arthoscopic repair of meniscus    . UMBILICAL HERNIA REPAIR     Family History  Problem Relation Age of Onset  . Diabetes Mother   . Heart disease Mother   . Diabetes Brother    Social History:  reports that he quit smoking about 37 years ago. He has quit using smokeless tobacco. He reports that he does not drink alcohol or use drugs.  ROS: As per HPI otherwise negative.  Physical Exam: Vitals:   04/13/17 1700 04/13/17 2226 04/14/17 0500  04/14/17 1001  BP: (!) 154/59 (!) 144/50 (!) 105/49 (!) 115/50  Pulse: 71 70 (!) 58 (!) 59  Resp: 18 18 17 18   Temp: 98.5 F (36.9 C) 98.8 F (37.1 C) 99.1 F (37.3 C) 99.1 F (37.3 C)  TempSrc: Oral Oral Oral Oral  SpO2: 100% 100% 98% 100%  Weight:  120.4 kg (265 lb 6.9 oz)       General: Well developed, obese male, in no acute distress. Head: Normocephalic, atraumatic, sclera non-icteric, mucus membranes are moist. Neck: Supple without lymphadenopathy/masses.  Lungs: Clear bilaterally to auscultation without wheezes, rales, or rhonchi. Breathing is unlabored. Heart: RRR with normal S1, S2. No murmurs, rubs, or gallops appreciated. Abdomen: Soft, non-tender, non-distended with normoactive  bowel sounds. Musculoskeletal:  Strength and tone appear normal for age. Lower extremities: B legs with compression wraps. 1+ edema above the wraps. Neuro: Alert and oriented X 3. Moves all extremities spontaneously. Psych:  Responds to questions appropriately with a normal affect. Dialysis Access: L forearm AVF + thrill.  Allergies  Allergen Reactions  . Shrimp [Shellfish Allergy] Swelling   Prior to Admission medications   Medication Sig Start Date End Date Taking? Authorizing Provider  amLODipine (NORVASC) 10 MG tablet Take 10 mg by mouth every morning.   Yes Historical Provider, MD  aspirin EC 81 MG tablet Take 81 mg by mouth every morning.   Yes Historical Provider, MD  calcium acetate (PHOSLO) 667 MG capsule Take 2,001-2,668 mg by mouth See admin instructions. Takes 4 capsules three times a day with meals and 3 capsules with each snacks. 07/08/15  Yes Historical Provider, MD  carvedilol (COREG) 6.25 MG tablet Take 1 tablet (6.25 mg total) by mouth 2 (two) times daily with a meal. 03/28/17  Yes [name], MD  cloNIDine (CATAPRES - DOSED IN MG/24 HR) 0.1 mg/24hr patch Place 1 patch (0.1 mg total) onto the skin once a week. 03/31/17  Yes [name], MD  furosemide (LASIX) 80 MG tablet Take 1 tablet (80 mg total) by mouth 2 (two) times daily. 06/15/16  Yes [name], MD  oxyCODONE-acetaminophen (PERCOCET) 5-325 MG tablet Take one or two tablets by mouth every 6 hours as needed for pain. 04/07/17  Yes Lenn Cal, DDS  pantoprazole (PROTONIX) 40 MG tablet Take 40 mg by mouth daily. 03/13/17  Yes Historical Provider, MD  urea 10 % lotion Apply topically 2 (two) times daily. Patient taking differently: Apply 1 application topically daily.  03/28/17  Yes [name], MD  vancomycin IVPB Inject 1,250 mg into the vein Every Tuesday,Thursday,and Saturday with dialysis. Indication:  bacteremia Last Day of Therapy:  04/19/18 Labs - Per HD Center 03/30/17 04/19/17 Yes [name], MD   Current Facility-Administered Medications  Medication Dose Route Frequency Provider Last Rate Last Dose  . 0.9 %  sodium chloride infusion  100 mL Intravenous PRN Valentina Gu, NP      . 0.9 %  sodium chloride infusion  100 mL Intravenous PRN [name], MD      . 0.9 %  sodium chloride infusion  100 mL Intravenous PRN [name], MD      . alteplase (CATHFLO ACTIVASE) injection 2 mg  2 mg Intracatheter Once PRN Valentina Gu, NP      . calcitRIOL (ROCALTROL) capsule 1.75 mcg  1.75 mcg Oral Q T,Th,Sa-HD Valentina Gu, NP      . carvedilol (COREG) tablet 6.25 mg  6.25 mg Oral BID  WC Mercy Riding, MD   6.25 mg at 04/14/17 0848  . [START ON 04/17/2017] cloNIDine (CATAPRES - Dosed in mg/24 hr) patch 0.1 mg  0.1 mg Transdermal Q Mon Mercy Riding, MD      . furosemide (LASIX) tablet 80 mg  80 mg Oral BID Sela Hilding, MD   80 mg at 04/14/17 0848  . heparin injection 1,000 Units  1,000 Units Dialysis PRN [name], MD      . heparin injection 5,000 Units  5,000 Units Subcutaneous Q8H Mercy Riding, MD   5,000 Units at 04/14/17 0607  . heparin injection 6,000 Units  6,000 Units Dialysis Once in dialysis [name], MD      . insulin aspart (novoLOG) injection 0-9 Units  0-9 Units Subcutaneous TID WC Mercy Riding, MD      . lidocaine (PF) (XYLOCAINE) 1 % injection 5 mL  5 mL Intradermal PRN Valentina Gu, NP      . lidocaine-prilocaine (EMLA) cream 1 application  1 application Topical PRN Valentina Gu, NP      . pentafluoroprop-tetrafluoroeth (GEBAUERS) aerosol 1 application  1 application Topical PRN Valentina Gu, NP      . vancomycin (VANCOCIN) 1,250 mg in sodium chloride 0.9 % 250 mL IVPB  1,250 mg Intravenous Q T,Th,Sa-HD Franky Macho, [facility]       Labs: Basic Metabolic Panel:  Recent Labs Lab 04/12/17 1852 04/13/17 0507 04/14/17 0842  NA 132* 133* 133*  K 4.4 4.1 3.9  CL 96* 95* 94*  CO2 23 23 28   GLUCOSE 113* 104*  132*  BUN 44* 49* 28*  CREATININE 11.26* 12.14*  12.16* 9.23*  CALCIUM 8.5* 8.6* 8.6*  PHOS  --  5.5*  5.5*  --    Liver Function Tests:  Recent Labs Lab 04/12/17 1852 04/13/17 0507  AST 17  --   ALT 15*  --   ALKPHOS 60  --   BILITOT 0.7  --   PROT 7.7  --   ALBUMIN 2.9* 2.8*    Recent Labs Lab 04/13/17 0507  AMMONIA 18   CBC:  Recent Labs Lab 04/07/17 2228 04/12/17 1852 04/13/17 0507 04/14/17 0842  WBC 12.8* 6.7 6.7 10.2  NEUTROABS  --  5.2  --   --   HGB 10.4* 9.3* 8.8* 9.2*  HCT 33.0* 29.4* 27.9* 28.4*  MCV 95.1 93.3 93.0 93.1  PLT 195 165 168 185   CBG:  Recent Labs Lab 04/13/17 0802 04/13/17 1721 04/13/17 2218 04/14/17 0753 04/14/17 1215  GLUCAP 86 103* 141* 122* 157*   Iron Studies:  Recent Labs  04/13/17 0507  IRON 18*  TIBC 189*  FERRITIN 1,002*   Studies/Results: Dg Chest 2 View  Result Date: 04/12/2017 CLINICAL DATA:  Pt and family member reports last dialysis was yesterday. Pt having fatigue and altered today "not acting like himself" and sleeping a lot. Reports fever and chills. Pt had dental extractions done this past Friday. Foley into yesterday. EXAM: CHEST  2 VIEW COMPARISON:  03/21/2017 FINDINGS: Heart is mildly enlarged. There are no focal consolidations or pleural effusions. Mild bibasilar atelectasis. No pulmonary edema. Electronic device overlies the chest. IMPRESSION: Cardiomegaly and mild bibasilar atelectasis. Electronically Signed   By: Nolon Nations M.D.   On: 04/12/2017 19:47   US Renal  Result Date: 04/13/2017 CLINICAL DATA:  UTI EXAM: RENAL / URINARY TRACT ULTRASOUND COMPLETE COMPARISON:  None. FINDINGS: Right Kidney: Length: 10.6 cm. Echogenicity within normal limits.  No mass or hydronephrosis visualized. Left Kidney: Length: 10.8 cm. 2.2 cm cyst is noted in the midportion of the left kidney. Bladder: Appears normal for degree of bladder distention. Cholelithiasis is noted without complicating factors. IMPRESSION:  Cholelithiasis without complicating factors. Left renal cyst. Electronically Signed   By: Inez Catalina M.D.   On: 04/13/2017 10:35    Dialysis Orders:  [place] T,Th,S  4 hrs 180 NRe 450/800 118 kg 2.0 K/2.25 Ca  UF Profile 4 Heparin: 6000 units IV TIW Mircera 100 mg IV q 2 weeks (Last dose 04/11/17 Last HGB 9.6 Ferritin 826 Fe 56 Tsat 24% 04/06/17) Calcitriol 1.75 mcg PO TIW (last PTH 369 04/06/17)  BMD meds:  Calcium Acetate 667mg  4-6 capsules PO TID AC 4 capsules w/snack  Assessment/Plan: 1.  UTI: AMS improved. On Zosyn. UCx with mixed bacteria. BCx NGTD. 2.  Hx Rothia bacteremia: Finishing course of IV Vancomycin for this. 3.  ESRD: Continue TTS schedule, next due 5/5. 4.  Hypertension/volume: BP controlled, + edema on exam. trying to lower EDW if tolerated. 5.  Anemia: Hgb 9.2, not due to ESA yet. 6.  Metabolic bone disease: Ca/Phos ok. Continue binders/VDRA. 7.  Nutrition: Alb 2.8, adding protein supps.  Veneta Penton, PA-C 04/14/2017, 12:39 PM  [facility] Pager: [phone]  Pt seen, examined and agree w A/P as above.  [name] MD [employer] pager [phone]   04/14/2017, 3:15 PM

## 2017-04-14 NOTE — Discharge Summary (Signed)
[facility] Discharge Summary  Patient name: [name] Medical record number: [id number] Date of birth: July 18, 1951 Age: 66 y.o. Gender: male Date of Admission: 04/12/2017  Date of Discharge: 04/15/17  Admitting Physician: Lupita Dawn, MD  Primary Care Provider: Alease Frame Marylynn Pearson, MD Consultants: nephrology  Indication for Hospitalization: AMS  Discharge Diagnoses/Problem List:  Rothia bacteremia ESRD Periodontitis HTN PAF dCHF Anemia of chronic disease DM II MGUS Lymphedema  Disposition: home  Discharge Condition: stable  Discharge Exam: see progress note from day of discharge  Brief Hospital Course:  Patient presented with fatigue and altered mental status. Recently admitted about 3 weeks ago with Rothia bacteremia, which has been treated with vancomycin with hemodialysis. Since discharge, has had many teeth removed, for which he was prescribed Percocet. A few days prior to admission, he was found to have urinary retention and presented to the ED, for which a Foley was placed and he was discharged. He followed up with his PCP 3 days prior to admission, when Foley was removed. On day of admission, patient felt he was weak and family brought him to the emergency room. He was noted to be less responsive than normal. UA was collected, which had bacteria, leukocytosis. Due to concern for UTI, patient was started on Zosyn in addition to the vancomycin dosed with hemodialysis. Patient's altered mental status quickly resolved, no other cause was identified. Urine culture resulted with multiple species. On hospital day 2, Zosyn was discontinued as patient was well appearing and urinary source less likely. Patient was monitored overnight to ensure no return of altered mental status and to observe penile swelling. As penile swelling was possibly balanitis, topical antifungal was prescribed at discharge. Hemodialysis was cut short on Thursday 5/3, but patient  received vancomycin later that day. Hemodialysis was completed on day of discharge.  Issues for Follow Up:  1. Discharged without home norvasc as BP normotensive for the majority of admission. Reassess need to restart this. Additionally, patient on Coreg and there was a concern raised from nephrology about the patient receiving coreg prior to hemodialysis. Curious whether this needs to be dosed after dialysis or only on nondialysis days. 2. Penile swelling improving at discharge, please recheck. Concern for balanitis, clotrimazole cream prescribed at discharge. 3. Anemia studies consistent with ACD, patient may need epo but will defer to nephrology.   Significant Procedures: Hemodialysis 5/3 and 5/5.  Significant Labs and Imaging:   Recent Labs Lab 04/13/17 0507 04/14/17 0842 04/15/17 1004  WBC 6.7 10.2 9.6  HGB 8.8* 9.2* 9.0*  HCT 27.9* 28.4* 27.8*  PLT 168 185 203    Recent Labs Lab 04/12/17 1852 04/13/17 0507 04/14/17 0842 04/15/17 1004  NA 132* 133* 133* 130*  K 4.4 4.1 3.9 3.7  CL 96* 95* 94* 93*  CO2 23 23 28 24   GLUCOSE 113* 104* 132* 153*  BUN 44* 49* 28* 42*  CREATININE 11.26* 12.14*  12.16* 9.23* 11.59*  CALCIUM 8.5* 8.6* 8.6* 8.8*  MG  --  2.0  --   --   PHOS  --  5.5*  5.5*  --  5.0*  ALKPHOS 60  --   --   --   AST 17  --   --   --   ALT 15*  --   --   --   ALBUMIN 2.9* 2.8*  --  2.7*    Results/Tests Pending at Time of Discharge: Blood cultures 2  Discharge Medications:  Allergies as of  04/15/2017      Reactions   Shrimp [shellfish Allergy] Swelling      Medication List    STOP taking these medications   amLODipine 10 MG tablet Commonly known as:  NORVASC     TAKE these medications   aspirin EC 81 MG tablet Take 81 mg by mouth every morning.   calcium acetate 667 MG capsule Commonly known as:  PHOSLO Take 2,001-2,668 mg by mouth See admin instructions. Takes 4 capsules three times a day with meals and 3 capsules with each snacks.    carvedilol 6.25 MG tablet Commonly known as:  COREG Take 1 tablet (6.25 mg total) by mouth 2 (two) times daily with a meal.   cloNIDine 0.1 mg/24hr patch Commonly known as:  CATAPRES - Dosed in mg/24 hr Place 1 patch (0.1 mg total) onto the skin once a week.   clotrimazole 1 % cream Commonly known as:  LOTRIMIN Apply 1 application topically 2 (two) times daily.   furosemide 80 MG tablet Commonly known as:  LASIX Take 1 tablet (80 mg total) by mouth 2 (two) times daily.   oxyCODONE-acetaminophen 5-325 MG tablet Commonly known as:  PERCOCET Take one or two tablets by mouth every 6 hours as needed for pain.   pantoprazole 40 MG tablet Commonly known as:  PROTONIX Take 40 mg by mouth daily.   urea 10 % lotion Apply topically 2 (two) times daily. What changed:  how much to take  when to take this   vancomycin IVPB Inject 1,250 mg into the vein Every Tuesday,Thursday,and Saturday with dialysis. Indication:  bacteremia Last Day of Therapy:  04/19/18 Labs - Per HD Center       Discharge Instructions: Please refer to Patient Instructions section of EMR for full details.  Patient was counseled important signs and symptoms that should prompt return to medical care, changes in medications, dietary instructions, activity restrictions, and follow up appointments.   Follow-Up Appointments: Follow-up Information    McKeag, Marylynn Pearson, MD. Go on 04/25/2017.   Specialty:  Family Medicine Why:  4pm for hospital follow up and recheck penis Contact information: [address] [place] [address] [phone]           Sela Hilding, MD 04/15/2017, 7:42 PM PGY-1, [place]

## 2017-04-14 NOTE — Progress Notes (Signed)
Family Medicine Teaching Service Daily Progress Note Intern Pager: [phone]  Patient name: [name] Medical record number: [id number] Date of birth: 21-Jun-1951 Age: 66 y.o. Gender: male  Primary Care Provider: Georges Lynch, MD Consultants: nephrology for HD Code Status: FULL   Pt Overview and Major Events to Date:  5/3 admitted with likely UTI  Assessment and Plan: [name] a 66 y.o.malepresenting with AMS and letheragy. PMH is significant for ESRD on HD TTSa, Rothia bacteremia (on Vanc with HD 4/10>), ?Endocarditis, MGUS, HTN, DM, Thrombocytopenia, PAF, Diastolic CHF, lymphedema,venous insufficiency, chronic periodontitis,dental caries and multiple loculation status post dental extraction.   UTI: Chills, fever and and dirtyUA concerning for pyelonephritis/urosepsis. Renal US without signs of hydronephrosis. Urine culture with multiple species. -Continue cardiac monitoring -consider PO transition of antibiotics vs discontinuation -Follow cultures -CBC  Rothiabacteremia: Patient was hospitalized 4/10-4/17 for this. Discharged on vancomycin with HD x 6 weeks. -Continue vancomycin with HD  ESRD: Still makes some urine. On HD TTS. Last HD was 5/1.  -HD started 5/3 but patient stopped early and refused vanc  Periodontitis/dental caries status post dental extraction on 4/27. No signs of abscess or inflammation.  - Antibiotic as above - Patient has follow-up with his dentist  Hypertension: SBP in the range of 140s to 160s.  -Continue on clonidine and Coreg -Holding homeAmlodipine and Lasix  PAF: NSR now. CHAD-Vascscore 4-5. Cardiology recommended a 30 day event at his recent admission.They did not recommend for him to be on anticoagulation at this time. -Continue his home aspirin -Continue Coreg -Cardiac monitoring  Diastolic HF: Echocardiogram on 03/24/2017 with EF of 55-60% and G2DD. On Lasix 80 mg twice a day at home -resume lasix 80mg  BID  ACD:  Hgb 9.3 (baseline 9-10). Last iron study in Epic in 2016 unless he had another one at HD center. Anemia panel consistent with anemia of chronic disease. -daily CBC -consider    DM 2: Last A1c 6.8 on 03/13/2017. Not on any medication -SSI-renal -Monitor CBG before meals and at bedtime  MGUS: Appears stable per hem-onc at his recent admission.  -May needoutpatient oncology follow-up  Lymphedema: As an Ace wrap or on his legs bilaterally. OnLasix 80 mg twice a day at home -continue lasix  Disposition: continued inpatient management of UTI  Subjective:  Patient feels well this morning. Endorses gum pain. Says he came off HD because he was having cramps.   Objective: Temp:  [98.5 F (36.9 C)-99.3 F (37.4 C)] 99.1 F (37.3 C) (05/04 0500) Pulse Rate:  [54-71] 58 (05/04 0500) Resp:  [16-18] 17 (05/04 0500) BP: (105-161)/(49-74) 105/49 (05/04 0500) SpO2:  [97 %-100 %] 98 % (05/04 0500) Weight:  [263 lb 14.3 oz (119.7 kg)-270 lb 1 oz (122.5 kg)] 265 lb 6.9 oz (120.4 kg) (05/03 2226) Physical Exam: General: Obese male resting in chair in NAD.  Cardiovascular: RRR, no murmur Respiratory: CTAB, easy WOB  Abdomen: SNTND, +BS  Extremities:   Laboratory:  Recent Labs Lab 04/07/17 2228 04/12/17 1852 04/13/17 0507  WBC 12.8* 6.7 6.7  HGB 10.4* 9.3* 8.8*  HCT 33.0* 29.4* 27.9*  PLT 195 165 168    Recent Labs Lab 04/07/17 2228 04/12/17 1852 04/13/17 0507  NA 136 132* 133*  K 4.3 4.4 4.1  CL 96* 96* 95*  CO2 27 23 23   BUN 44* 44* 49*  CREATININE 9.62* 11.26* 12.14*  12.16*  CALCIUM 8.9 8.5* 8.6*  PROT  --  7.7  --   BILITOT  --  0.7  --   ALKPHOS  --  60  --   ALT  --  15*  --   AST  --  17  --   GLUCOSE 187* 113* 104*   Imaging/Diagnostic Tests: Dg Orthopantogram  Result Date: 03/25/2017 CLINICAL DATA:  Bacteremia EXAM: ORTHOPANTOGRAM/PANORAMIC COMPARISON:  None. FINDINGS: A Panorex study was provided. Near the edentulous mandibular ridges are seen  bilaterally. Missing right lower central canine. Super erupted right-sided upper molar teeth with caries noted. Remaining root tips are noted just anterior to this on the right. These may harbor areas of infection/ sites of bacteremia. Numerous missing teeth in the left upper maxilla as well without significant carie formation. IMPRESSION: Caries within right-sided supererupted maxillary molar teeth with 1 and possibly 2 remaining root tips in the region of the premolars on the right. These are potential sites of bacteremia. Electronically Signed   By: Ashley Royalty M.D.   On: 03/25/2017 01:58   Dg Chest 2 View  Result Date: 04/12/2017 CLINICAL DATA:  Pt and family member reports last dialysis was yesterday. Pt having fatigue and altered today "not acting like himself" and sleeping a lot. Reports fever and chills. Pt had dental extractions done this past Friday. Foley into yesterday. EXAM: CHEST  2 VIEW COMPARISON:  03/21/2017 FINDINGS: Heart is mildly enlarged. There are no focal consolidations or pleural effusions. Mild bibasilar atelectasis. No pulmonary edema. Electronic device overlies the chest. IMPRESSION: Cardiomegaly and mild bibasilar atelectasis. Electronically Signed   By: Nolon Nations M.D.   On: 04/12/2017 19:47   Dg Chest 2 View  Result Date: 03/21/2017 CLINICAL DATA:  Fever, weakness, cough, shortness of Breath EXAM: CHEST  2 VIEW COMPARISON:  06/23/2015 FINDINGS: Cardiomediastinal silhouette is stable. Mild interstitial prominence bilateral lower lobes may represent mild interstitial edema scarring or pneumonitis. No segmental consolidation. IMPRESSION: Mild interstitial prominence bilateral lower lobes may represent mild interstitial edema scarring or pneumonitis. No segmental consolidation. Electronically Signed   By: Lahoma Crocker M.D.   On: 03/21/2017 15:43   Ct Soft Tissue Neck W Contrast  Result Date: 03/24/2017 CLINICAL DATA:  66 y/o M; bacteremia, neck pain, and knot on the neck.  EXAM: CT NECK WITH CONTRAST TECHNIQUE: Multidetector CT imaging of the neck was performed using the standard protocol following the bolus administration of intravenous contrast. CONTRAST:  25mL ISOVUE-300 IOPAMIDOL (ISOVUE-300) INJECTION 61% COMPARISON:  None. FINDINGS: Pharynx and larynx: Normal. No mass or swelling. Salivary glands: No inflammation, mass, or stone. Thyroid: Normal. Lymph nodes: Left supraclavicular lymphadenopathy measuring 15 x 15 x 18 mm (series 3, image 77). Right pericardial masses, probably lymphadenopathy is measuring up to 21 x 16 mm (series 3, image 128). Vascular: Moderate calcific atherosclerosis of the aortic arch. Calcified plaque of the bilateral carotid bifurcations without high-grade stenosis. Dense calcification of the intracranial vertebral artery is with probable high-grade underlying stenosis. Dense calcification of bilateral cavernous internal carotid arteries, partially visualized. Limited intracranial: Negative. Visualized orbits: Visualized portions are negative. Mastoids and visualized paranasal sinuses: Moderate left and mild right maxillary sinus mucosal thickening. Skeleton: Advanced cervical degenerative changes with endplate eburnation at the C5-6 and C6-7 levels and at multiple upper cervical right-sided facet joints probably representing renal spondyloarthropathy. Upper chest: Negative. Other: None. IMPRESSION: 1. No discrete mass or collection of the neck identified. 2. Left supraclavicular and right pericardial lymphadenopathy. This may be due to systemic infection, lymphoproliferative disease, or possibly metastatic disease of uncertain primary. 3. Advanced cervical spine degenerative changes with finding of  renal spondyloarthropathy. Electronically Signed   By: Kristine Garbe M.D.   On: 03/24/2017 19:36   US Renal  Result Date: 04/13/2017 CLINICAL DATA:  UTI EXAM: RENAL / URINARY TRACT ULTRASOUND COMPLETE COMPARISON:  None. FINDINGS: Right Kidney:  Length: 10.6 cm. Echogenicity within normal limits. No mass or hydronephrosis visualized. Left Kidney: Length: 10.8 cm. 2.2 cm cyst is noted in the midportion of the left kidney. Bladder: Appears normal for degree of bladder distention. Cholelithiasis is noted without complicating factors. IMPRESSION: Cholelithiasis without complicating factors. Left renal cyst. Electronically Signed   By: Inez Catalina M.D.   On: 04/13/2017 10:35   US Abdomen Limited Ruq  Result Date: 03/24/2017 CLINICAL DATA:  Right upper quadrant pain since yesterday. EXAM: US ABDOMEN LIMITED - RIGHT UPPER QUADRANT COMPARISON:  Renal ultrasound 02/08/2014.  CT 06/04/2009. FINDINGS: Gallbladder: Gallstones of up to 9 mm. No pericholecystic fluid or wall thickening. Sonographic Murphy's sign was not elicited. Common bile duct: Diameter: Normal, 6 mm. Liver: No focal lesion identified. Within normal limits in parenchymal echogenicity. Mildly limited exam secondary to patient body habitus. IMPRESSION: Cholelithiasis without acute cholecystitis or biliary duct dilatation. Mildly limited exam secondary to patient body habitus. Electronically Signed   By: Abigail Miyamoto M.D.   On: 03/24/2017 16:57    Sela Hilding, MD 04/14/2017, 7:29 AM PGY-1, [place] Intern pager: [phone], text pages welcome

## 2017-04-14 NOTE — Progress Notes (Signed)
Pharmacy Antibiotic Note  [name] is a 66 y.o. male admitted on 04/12/2017 with AMS.  Pharmacy has been consulted for continued Vancomycin dosing (Pt has been on Vanc since 4/10 for Rothia bacteremia/?endocarditis - supposed to end 5/9). Adding Zosyn for urosepsis.  Home Vanc dose 1250mg  IV with HD (T/T/S). Pre-HD vanc level on 5/3 AM was therapeutic at 24mcg/ml (goal 15-25 mcg/ml). Patient went to HD yesterday, but did not have a full session as he asked to sign off early- vancomycin was not given at the end of the session. He tolerated 3h @ BFR of 400 which would make post-HD level ~12.87mcg/mL.  Plan: -Zosyn 3.375gm IV q12h - doses over 4 hours -Vancomycin 1g IV x1 to be given this morning (5/4) on the floor, then continue Vancomycin 1250mg  IV with qHD-TTSat -Will f/u micro data, HD tolerance/schedule, and pt's clinical condition -Pre-HD Vanc level prn   Weight: 265 lb 6.9 oz (120.4 kg)  Temp (24hrs), Avg:98.8 F (37.1 C), Min:98.5 F (36.9 C), Max:99.3 F (37.4 C)   Recent Labs Lab 04/07/17 2228 04/12/17 1852 04/12/17 1922 04/13/17 0110 04/13/17 0507  WBC 12.8* 6.7  --   --  6.7  CREATININE 9.62* 11.26*  --   --  12.14*  12.16*  LATICACIDVEN  --   --  1.25 0.55  --   VANCORANDOM  --   --   --   --  19    Estimated Creatinine Clearance: 7.7 mL/min (A) (by C-G formula based on SCr of 12.14 mg/dL (H)).    Allergies  Allergen Reactions  . Shrimp [Shellfish Allergy] Swelling    Antimicrobials this admission: Vanc 4/10 >>  Zosyn 5/3 >>   Dose adjustments this admission: 5/4: vancomycin 1g given for a semi-tolerated HD session on 5/3  Microbiology results: 4/10 BCx: rothia species 4/11 BCx: negative 4/28 urine: insignificant growth 5/2 BCx x2: ngtd 5/3 urine:  Thank you for allowing pharmacy to be a part of this patient's care.  Florina Glas D. Serafina Topham, PharmD, BCPS Clinical Pharmacist Pager: [phone] 04/14/2017 8:40 AM

## 2017-04-15 DIAGNOSIS — R7881 Bacteremia: Secondary | ICD-10-CM

## 2017-04-15 DIAGNOSIS — N4889 Other specified disorders of penis: Secondary | ICD-10-CM

## 2017-04-15 LAB — RENAL FUNCTION PANEL
ANION GAP: 13 (ref 5–15)
Albumin: 2.7 g/dL — ABNORMAL LOW (ref 3.5–5.0)
BUN: 42 mg/dL — ABNORMAL HIGH (ref 6–20)
CALCIUM: 8.8 mg/dL — AB (ref 8.9–10.3)
CHLORIDE: 93 mmol/L — AB (ref 101–111)
CO2: 24 mmol/L (ref 22–32)
Creatinine, Ser: 11.59 mg/dL — ABNORMAL HIGH (ref 0.61–1.24)
GFR calc Af Amer: 5 mL/min — ABNORMAL LOW (ref >60)
GFR calc non Af Amer: 4 mL/min — ABNORMAL LOW (ref >60)
GLUCOSE: 153 mg/dL — AB (ref 65–99)
POTASSIUM: 3.7 mmol/L (ref 3.5–5.1)
Phosphorus: 5 mg/dL — ABNORMAL HIGH (ref 2.5–4.6)
SODIUM: 130 mmol/L — AB (ref 135–145)

## 2017-04-15 LAB — GLUCOSE, CAPILLARY
Glucose-Capillary: 128 mg/dL — ABNORMAL HIGH (ref 65–99)
Glucose-Capillary: 105 mg/dL — ABNORMAL HIGH (ref 65–99)
Glucose-Capillary: 147 mg/dL — ABNORMAL HIGH (ref 65–99)

## 2017-04-15 LAB — CBC
HEMATOCRIT: 27.8 % — AB (ref 39.0–52.0)
HEMOGLOBIN: 9 g/dL — AB (ref 13.0–17.0)
MCH: 29.6 pg (ref 26.0–34.0)
MCHC: 32.4 g/dL (ref 30.0–36.0)
MCV: 91.4 fL (ref 78.0–100.0)
Platelets: 203 10*3/uL (ref 150–400)
RBC: 3.04 MIL/uL — ABNORMAL LOW (ref 4.22–5.81)
RDW: 16.3 % — ABNORMAL HIGH (ref 11.5–15.5)
WBC: 9.6 10*3/uL (ref 4.0–10.5)

## 2017-04-15 MED ORDER — CALCITRIOL 0.5 MCG PO CAPS
ORAL_CAPSULE | ORAL | Status: AC
  Filled 2017-04-15: qty 3

## 2017-04-15 MED ORDER — CARVEDILOL 6.25 MG PO TABS
6.2500 mg | ORAL_TABLET | Freq: Two times a day (BID) | ORAL | Status: DC
  Administered 2017-04-15: 6.25 mg via ORAL
  Filled 2017-04-15: qty 1

## 2017-04-15 MED ORDER — HEPARIN SODIUM (PORCINE) 1000 UNIT/ML DIALYSIS: 20.0000 [IU]/kg | INTRAMUSCULAR | Status: DC | PRN

## 2017-04-15 MED ORDER — CLOTRIMAZOLE 1 % EX CREA: 1.0000 "application " | TOPICAL_CREAM | Freq: Two times a day (BID) | CUTANEOUS | 0 refills | Status: DC

## 2017-04-15 MED ORDER — CALCITRIOL 0.25 MCG PO CAPS
ORAL_CAPSULE | ORAL | Status: AC
  Filled 2017-04-15: qty 1

## 2017-04-15 NOTE — Progress Notes (Signed)
Patient informed of his discharge orders and patient states that he is concerned that he has balanitis. MD informed of this information. MD stated that she would order a cream at discharge. Will continue to monitor.

## 2017-04-15 NOTE — Progress Notes (Signed)
Family Medicine Teaching Service Daily Progress Note Intern Pager: [phone]  Patient name: [name] Medical record number: [id number] Date of birth: [birth year] Age: 66 y.o. Gender: male  Primary Care Provider: Alease Frame Marylynn Pearson, MD Consultants: nephrology for HD Code Status: FULL   Pt Overview and Major Events to Date:  5/3 admitted with likely UTI  Assessment and Plan: Patrick Galli Clarkis a 66 y.o.malepresenting with AMS and letheragy. PMH is significant for ESRD on HD TTSa, Rothia bacteremia (on Vanc with HD 4/10>), ?Endocarditis, MGUS, HTN, DM, Thrombocytopenia, PAF, Diastolic CHF, lymphedema,venous insufficiency, chronic periodontitis,dental caries and multiple loculation status post dental extraction.   UTI: Chills, fever and and dirtyUA concerning for pyelonephritis/urosepsis. Renal US without signs of hydronephrosis. Urine culture with multiple species. -Continue cardiac monitoring -no additional abx -Follow cultures (NGTD) -CBC  Rothiabacteremia: Patient was hospitalized 4/10-4/17 for this. Discharged on vancomycin with HD. -Continue vancomycin with HD, end date 5/7  ESRD: Still makes some urine. On HD TTS. Last HD was 5/1.  -HD today, going for a lower dry weight  Periodontitis/dental caries status post dental extraction on 4/27. No signs of abscess or inflammation.  - Patient has follow-up with his dentist  Hypertension: SBP in the range of 140s to 160s.  -Continue on clonidine and Coreg -Holding homeAmlodipine and Lasix  PAF: NSR now. CHAD-Vascscore 4-5. Cardiology recommended a 30 day event at his recent admission.They did not recommend for him to be on anticoagulation at this time. -Continue his home aspirin -Continue Coreg -Cardiac monitoring  Diastolic HF: Echocardiogram on 03/24/2017 with EF of 55-60% and G2DD. On Lasix 80 mg twice a day at home -lasix 80mg  BID  ACD: Hgb 9.3 (baseline 9-10). Last iron study in Epic in 2016 unless he had  another one at HD center. Anemia panel consistent with anemia of chronic disease. -daily CBC -consider epo per nephrology   DM 2: Last A1c 6.8 on 03/13/2017. Not on any medication -SSI-renal -Monitor CBG before meals and at bedtime  MGUS: Appears stable per hem-onc at his recent admission.  -May needoutpatient oncology follow-up  Lymphedema: As an Ace wrap or on his legs bilaterally. OnLasix 80 mg twice a day at home -continue lasix  Disposition: continued inpatient management of UTI  Subjective:  Patient feels well this morning. Feels his penile swelling is improved but not resolved. [place] with going home today.   Objective: Temp:  [98.3 F (36.8 C)-98.9 F (37.2 C)] 98.3 F (36.8 C) (05/05 0930) Pulse Rate:  [53-65] 55 (05/05 1000) Resp:  [17-18] 18 (05/05 1000) BP: (118-151)/(50-80) 137/64 (05/05 1000) SpO2:  [100 %] 100 % (05/05 0930) Weight:  [262 lb 6.4 oz (119 kg)-265 lb 14 oz (120.6 kg)] 265 lb 14 oz (120.6 kg) (05/05 0930) Physical Exam: General: Obese male resting in chair in NAD.  Cardiovascular: RRR, no murmur Respiratory: CTAB, easy WOB  Abdomen: SNTND, +BS  Extremities: wrapped in kerlix and gauze bilaterally  Laboratory:  Recent Labs Lab 04/13/17 0507 04/14/17 0842 04/15/17 1004  WBC 6.7 10.2 9.6  HGB 8.8* 9.2* 9.0*  HCT 27.9* 28.4* 27.8*  PLT 168 185 203    Recent Labs Lab 04/12/17 1852 04/13/17 0507 04/14/17 0842  NA 132* 133* 133*  K 4.4 4.1 3.9  CL 96* 95* 94*  CO2 23 23 28   BUN 44* 49* 28*  CREATININE 11.26* 12.14*  12.16* 9.23*  CALCIUM 8.5* 8.6* 8.6*  PROT 7.7  --   --   BILITOT 0.7  --   --  ALKPHOS 60  --   --   ALT 15*  --   --   AST 17  --   --   GLUCOSE 113* 104* 132*   Imaging/Diagnostic Tests: Dg Orthopantogram  Result Date: 03/25/2017 CLINICAL DATA:  Bacteremia EXAM: ORTHOPANTOGRAM/PANORAMIC COMPARISON:  None. FINDINGS: A Panorex study was provided. Near the edentulous mandibular ridges are seen bilaterally.  Missing right lower central canine. Super erupted right-sided upper molar teeth with caries noted. Remaining root tips are noted just anterior to this on the right. These may harbor areas of infection/ sites of bacteremia. Numerous missing teeth in the left upper maxilla as well without significant carie formation. IMPRESSION: Caries within right-sided supererupted maxillary molar teeth with 1 and possibly 2 remaining root tips in the region of the premolars on the right. These are potential sites of bacteremia. Electronically Signed   By: Ashley Royalty M.D.   On: 03/25/2017 01:58   Dg Chest 2 View  Result Date: 04/12/2017 CLINICAL DATA:  Pt and family member reports last dialysis was yesterday. Pt having fatigue and altered today "not acting like himself" and sleeping a lot. Reports fever and chills. Pt had dental extractions done this past Friday. Foley into yesterday. EXAM: CHEST  2 VIEW COMPARISON:  03/21/2017 FINDINGS: Heart is mildly enlarged. There are no focal consolidations or pleural effusions. Mild bibasilar atelectasis. No pulmonary edema. Electronic device overlies the chest. IMPRESSION: Cardiomegaly and mild bibasilar atelectasis. Electronically Signed   By: Nolon Nations M.D.   On: 04/12/2017 19:47   Dg Chest 2 View  Result Date: 03/21/2017 CLINICAL DATA:  Fever, weakness, cough, shortness of Breath EXAM: CHEST  2 VIEW COMPARISON:  06/23/2015 FINDINGS: Cardiomediastinal silhouette is stable. Mild interstitial prominence bilateral lower lobes may represent mild interstitial edema scarring or pneumonitis. No segmental consolidation. IMPRESSION: Mild interstitial prominence bilateral lower lobes may represent mild interstitial edema scarring or pneumonitis. No segmental consolidation. Electronically Signed   By: Lahoma Crocker M.D.   On: 03/21/2017 15:43   Ct Soft Tissue Neck W Contrast  Result Date: 03/24/2017 CLINICAL DATA:  66 y/o M; bacteremia, neck pain, and knot on the neck. EXAM: CT NECK  WITH CONTRAST TECHNIQUE: Multidetector CT imaging of the neck was performed using the standard protocol following the bolus administration of intravenous contrast. CONTRAST:  59mL ISOVUE-300 IOPAMIDOL (ISOVUE-300) INJECTION 61% COMPARISON:  None. FINDINGS: Pharynx and larynx: Normal. No mass or swelling. Salivary glands: No inflammation, mass, or stone. Thyroid: Normal. Lymph nodes: Left supraclavicular lymphadenopathy measuring 15 x 15 x 18 mm (series 3, image 77). Right pericardial masses, probably lymphadenopathy is measuring up to 21 x 16 mm (series 3, image 128). Vascular: Moderate calcific atherosclerosis of the aortic arch. Calcified plaque of the bilateral carotid bifurcations without high-grade stenosis. Dense calcification of the intracranial vertebral artery is with probable high-grade underlying stenosis. Dense calcification of bilateral cavernous internal carotid arteries, partially visualized. Limited intracranial: Negative. Visualized orbits: Visualized portions are negative. Mastoids and visualized paranasal sinuses: Moderate left and mild right maxillary sinus mucosal thickening. Skeleton: Advanced cervical degenerative changes with endplate eburnation at the C5-6 and C6-7 levels and at multiple upper cervical right-sided facet joints probably representing renal spondyloarthropathy. Upper chest: Negative. Other: None. IMPRESSION: 1. No discrete mass or collection of the neck identified. 2. Left supraclavicular and right pericardial lymphadenopathy. This may be due to systemic infection, lymphoproliferative disease, or possibly metastatic disease of uncertain primary. 3. Advanced cervical spine degenerative changes with finding of renal spondyloarthropathy. Electronically Signed  By: Kristine Garbe M.D.   On: 03/24/2017 19:36   US Renal  Result Date: 04/13/2017 CLINICAL DATA:  UTI EXAM: RENAL / URINARY TRACT ULTRASOUND COMPLETE COMPARISON:  None. FINDINGS: Right Kidney: Length: 10.6 cm.  Echogenicity within normal limits. No mass or hydronephrosis visualized. Left Kidney: Length: 10.8 cm. 2.2 cm cyst is noted in the midportion of the left kidney. Bladder: Appears normal for degree of bladder distention. Cholelithiasis is noted without complicating factors. IMPRESSION: Cholelithiasis without complicating factors. Left renal cyst. Electronically Signed   By: Inez Catalina M.D.   On: 04/13/2017 10:35   US Abdomen Limited Ruq  Result Date: 03/24/2017 CLINICAL DATA:  Right upper quadrant pain since yesterday. EXAM: US ABDOMEN LIMITED - RIGHT UPPER QUADRANT COMPARISON:  Renal ultrasound 02/08/2014.  CT 06/04/2009. FINDINGS: Gallbladder: Gallstones of up to 9 mm. No pericholecystic fluid or wall thickening. Sonographic Murphy's sign was not elicited. Common bile duct: Diameter: Normal, 6 mm. Liver: No focal lesion identified. Within normal limits in parenchymal echogenicity. Mildly limited exam secondary to patient body habitus. IMPRESSION: Cholelithiasis without acute cholecystitis or biliary duct dilatation. Mildly limited exam secondary to patient body habitus. Electronically Signed   By: Abigail Miyamoto M.D.   On: 03/24/2017 16:57    Sela Hilding, MD 04/15/2017, 10:21 AM PGY-1, [place] Intern pager: [phone], text pages welcome

## 2017-04-15 NOTE — Progress Notes (Signed)
[facility] Progress Note   Subjective:  On HD, pleasant, NAD. No new complaints   Objective Vitals:   04/15/17 0930 04/15/17 0935 04/15/17 1000 04/15/17 1030  BP: (!) 151/68 137/60 137/64 (!) 117/51  Pulse: 65 60 (!) 55 (!) 47  Resp: 18 18 18 16   Temp: 98.3 F (36.8 C)     TempSrc: Oral     SpO2: 100%     Weight: 120.6 kg (265 lb 14 oz)     Height:       Physical Exam General: WNWD AA male in NAD Heart: HS distant S1,S2, SB on monitor Lungs: CTAB A/P. No WOB Abdomen: Active BS  Extremities: Uniboots BLE 1+ edema bilaterally Dialysis Access: LFA AVF cannulated at present for HD   Additional Objective Labs: Basic Metabolic Panel:  Recent Labs Lab 04/13/17 0507 04/14/17 0842 04/15/17 1004  NA 133* 133* 130*  K 4.1 3.9 3.7  CL 95* 94* 93*  CO2 23 28 24   GLUCOSE 104* 132* 153*  BUN 49* 28* 42*  CREATININE 12.14*  12.16* 9.23* 11.59*  CALCIUM 8.6* 8.6* 8.8*  PHOS 5.5*  5.5*  --  5.0*   Liver Function Tests:  Recent Labs Lab 04/12/17 1852 04/13/17 0507 04/15/17 1004  AST 17  --   --   ALT 15*  --   --   ALKPHOS 60  --   --   BILITOT 0.7  --   --   PROT 7.7  --   --   ALBUMIN 2.9* 2.8* 2.7*   No results for input(s): LIPASE, AMYLASE in the last 168 hours. CBC:  Recent Labs Lab 04/12/17 1852 04/13/17 0507 04/14/17 0842 04/15/17 1004  WBC 6.7 6.7 10.2 9.6  NEUTROABS 5.2  --   --   --   HGB 9.3* 8.8* 9.2* 9.0*  HCT 29.4* 27.9* 28.4* 27.8*  MCV 93.3 93.0 93.1 91.4  PLT 165 168 185 203   Blood Culture    Component Value Date/Time   SDES URINE, RANDOM 04/13/2017 0900   SPECREQUEST NONE 04/13/2017 0900   CULT MULTIPLE SPECIES PRESENT, SUGGEST RECOLLECTION (A) 04/13/2017 0900   REPTSTATUS 04/14/2017 FINAL 04/13/2017 0900    Cardiac Enzymes: No results for input(s): CKTOTAL, CKMB, CKMBINDEX, TROPONINI in the last 168 hours. CBG:  Recent Labs Lab 04/14/17 0753 04/14/17 1215 04/14/17 1718 04/14/17 2055 04/15/17 0739   GLUCAP 122* 157* 171* 144* 128*   Iron Studies:  Recent Labs  04/13/17 0507  IRON 18*  TIBC 189*  FERRITIN 1,002*   @lablastinr3 @ Studies/Results: No results found. Medications: . sodium chloride    . sodium chloride    . sodium chloride    . vancomycin     . calcitRIOL  1.75 mcg Oral Q T,Th,Sa-HD  . calcium acetate  2,001 mg Oral TID WC  . carvedilol  6.25 mg Oral BID WC  . [START ON 04/17/2017] cloNIDine  0.1 mg Transdermal Q Mon  . feeding supplement (PRO-STAT SUGAR FREE 64)  30 mL Oral BID  . furosemide  80 mg Oral BID  . heparin  5,000 Units Subcutaneous Q8H  . heparin  6,000 Units Dialysis Once in dialysis  . insulin aspart  0-9 Units Subcutaneous TID WC     Dialysis Orders:  [place] T,Th,S  4 hrs 180 NRe 450/800 118 kg 2.0 K/2.25 Ca UF Profile 4 Heparin: 6000 units IV TIW Mircera 100 mg IV q 2 weeks (Last dose 04/11/17 Last HGB 9.6 Ferritin 826 Fe 56 Tsat  24% 04/06/17) Calcitriol 1.75 mcg PO TIW (last PTH 369 04/06/17)  BMD meds:  Calcium Acetate 667mg  4-6 capsules PO TID AC 4 capsules w/snack  Assessment/Plan: 1.  UTI: AMS improved. On Zosyn. UCx with mixed bacteria. BCx NGTD. 2.  Hx Rothia bacteremia: Finishing course of IV Vancomycin for this. 3.  ESRD: Continue TTS schedule, on HD now. Was given coreg prior to HD-HR in 78s. K+ 3.7. 4.0 K bath.  4.  Hypertension/volume: BP controlled, + edema on exam. UFG 3 liters. Rec'd coreg prior to HD, May have issues with volume removal. Monitor carefully.  5.  Anemia: Hgb 9.0 today, not due to ESA yet. 6.  Metabolic bone disease: Phos 5.0 C 8.8 C Ca 9.8 Continue binders/VDRA. 7.  Nutrition: Alb 2.8, adding protein supps. 8.  DM: per primary  Rita H. Brown NP-C 04/15/2017, 10:58 AM  [facility] [phone]  Pt seen, examined and agree w A/P as above.  [name] MD [employer] pager [phone]   04/15/2017, 1:47 PM

## 2017-04-16 ENCOUNTER — Emergency Department (HOSPITAL_COMMUNITY)
Admission: EM | Admit: 2017-04-16 | Discharge: 2017-04-17 | Disposition: A | Discharge status: Home / Self Care | Payer: Medicare Other | Attending: Emergency Medicine | Admitting: Emergency Medicine

## 2017-04-16 ENCOUNTER — Encounter (HOSPITAL_COMMUNITY): Payer: Self-pay | Admitting: Emergency Medicine

## 2017-04-16 DIAGNOSIS — I132 Hypertensive heart and chronic kidney disease with heart failure and with stage 5 chronic kidney disease, or end stage renal disease: Secondary | ICD-10-CM | POA: Diagnosis not present

## 2017-04-16 DIAGNOSIS — Y813 Surgical instruments, materials and general- and plastic-surgery devices (including sutures) associated with adverse incidents: Secondary | ICD-10-CM | POA: Diagnosis not present

## 2017-04-16 DIAGNOSIS — T8131XA Disruption of external operation (surgical) wound, not elsewhere classified, initial encounter: Secondary | ICD-10-CM | POA: Insufficient documentation

## 2017-04-16 DIAGNOSIS — N186 End stage renal disease: Secondary | ICD-10-CM | POA: Insufficient documentation

## 2017-04-16 DIAGNOSIS — J45909 Unspecified asthma, uncomplicated: Secondary | ICD-10-CM | POA: Insufficient documentation

## 2017-04-16 DIAGNOSIS — I509 Heart failure, unspecified: Secondary | ICD-10-CM | POA: Insufficient documentation

## 2017-04-16 DIAGNOSIS — K91840 Postprocedural hemorrhage and hematoma of a digestive system organ or structure following a digestive system procedure: Secondary | ICD-10-CM | POA: Diagnosis not present

## 2017-04-16 DIAGNOSIS — N4889 Other specified disorders of penis: Secondary | ICD-10-CM

## 2017-04-16 DIAGNOSIS — Z87891 Personal history of nicotine dependence: Secondary | ICD-10-CM | POA: Diagnosis not present

## 2017-04-16 DIAGNOSIS — K068 Other specified disorders of gingiva and edentulous alveolar ridge: Secondary | ICD-10-CM

## 2017-04-16 DIAGNOSIS — T8130XA Disruption of wound, unspecified, initial encounter: Secondary | ICD-10-CM

## 2017-04-16 LAB — COMPREHENSIVE METABOLIC PANEL
ALBUMIN: 3.3 g/dL — AB (ref 3.5–5.0)
ALT: 22 U/L (ref 17–63)
AST: 23 U/L (ref 15–41)
Alkaline Phosphatase: 62 U/L (ref 38–126)
Anion gap: 12 (ref 5–15)
BUN: 34 mg/dL — ABNORMAL HIGH (ref 6–20)
CALCIUM: 9.6 mg/dL (ref 8.9–10.3)
CO2: 28 mmol/L (ref 22–32)
Chloride: 92 mmol/L — ABNORMAL LOW (ref 101–111)
Creatinine, Ser: 10.02 mg/dL — ABNORMAL HIGH (ref 0.61–1.24)
GFR calc Af Amer: 5 mL/min — ABNORMAL LOW (ref >60)
GFR calc non Af Amer: 5 mL/min — ABNORMAL LOW (ref >60)
Glucose, Bld: 150 mg/dL — ABNORMAL HIGH (ref 65–99)
Potassium: 4 mmol/L (ref 3.5–5.1)
SODIUM: 132 mmol/L — AB (ref 135–145)
TOTAL PROTEIN: 8.4 g/dL — AB (ref 6.5–8.1)
Total Bilirubin: 0.3 mg/dL (ref 0.3–1.2)

## 2017-04-16 LAB — CBC WITH DIFFERENTIAL/PLATELET
BASOS ABS: 0 10*3/uL (ref 0.0–0.1)
BASOS PCT: 0 %
EOS PCT: 5 %
Eosinophils Absolute: 0.5 10*3/uL (ref 0.0–0.7)
HCT: 28.5 % — ABNORMAL LOW (ref 39.0–52.0)
Hemoglobin: 9.3 g/dL — ABNORMAL LOW (ref 13.0–17.0)
Lymphocytes Relative: 23 %
Lymphs Abs: 2.3 10*3/uL (ref 0.7–4.0)
MCH: 30.1 pg (ref 26.0–34.0)
MCHC: 32.6 g/dL (ref 30.0–36.0)
MCV: 92.2 fL (ref 78.0–100.0)
MONO ABS: 0.9 10*3/uL (ref 0.1–1.0)
Monocytes Relative: 9 %
Neutro Abs: 6.1 10*3/uL (ref 1.7–7.7)
Neutrophils Relative %: 63 %
PLATELETS: 243 10*3/uL (ref 150–400)
RBC: 3.09 MIL/uL — AB (ref 4.22–5.81)
RDW: 16.9 % — AB (ref 11.5–15.5)
WBC: 9.8 10*3/uL (ref 4.0–10.5)

## 2017-04-16 LAB — ABO/RH: ABO/RH(D): A POS

## 2017-04-16 LAB — TYPE AND SCREEN
ABO/RH(D): A POS
ANTIBODY SCREEN: NEGATIVE

## 2017-04-16 MED ORDER — TRANEXAMIC ACID 1000 MG/10ML IV SOLN
500.0000 mg | Freq: Once | INTRAVENOUS | Status: AC
  Administered 2017-04-16: 500 mg via TOPICAL
  Filled 2017-04-16: qty 10

## 2017-04-16 NOTE — ED Notes (Signed)
Patient given a washcloth to help clot where he had his teeth pulled. Patient is bleeding a lot in his mouth and can not see where exactly it is coming out at.

## 2017-04-16 NOTE — ED Notes (Signed)
Pt c/o uncontrolled bleeding in the mouth. Pt had 21 teeth pulled on April 27th. He takes 81 mg aspirin daily.

## 2017-04-16 NOTE — ED Provider Notes (Signed)
Emergency Department Provider Note   I have reviewed the triage vital signs and the nursing notes.   HISTORY  Chief Complaint Post-op Problem   HPI [name] is a 66 y.o. male with PMH of CHF, CKD, DM, ESRD, HLD, and HTN presents to the emergency department for evaluation of bleeding from his tooth extraction site. Patient was eating pudding tonight when this started. He's had heavy bleeding that is filling up his mouth. He has been holding pressure with no relief in symptoms. Patient had multiple extractions with Dr. Enrique Sack on 04/07/2017. No complications until this occurred shortly PTA. No modifying factors.    Past Medical History:  Diagnosis Date  . Arthritis   . Asthma    as a child  . CHF (congestive heart failure) ([place])   . Chronic cystitis   . Chronic kidney disease   . Diabetes mellitus   . Dry skin   . Dyspnea   . Dysrhythmia   . GERD (gastroesophageal reflux disease)   . H/O hiatal hernia    states it's been fixed  . Hyperlipidemia   . Hypertension   . Lymphedema   . Morbid obesity ([place])   . NEPHROLITHIASIS, HX OF 12/02/2009   Qualifier: Diagnosis of  By: [name], Cat    . PVD (peripheral vascular disease) ([place])   . Renal insufficiency   . UMBILICAL HERNIA 04/03/5360   Qualifier: History of  By: [name] MD, Audelia Acton    . Venous insufficiency     Patient Active Problem List   Diagnosis Date Noted  . Penile swelling   . Scrotal swelling   . UTI (urinary tract infection) 04/13/2017  . Anemia of chronic disease   . Acute encephalopathy 04/12/2017  . Urinary retention 04/10/2017  . Bacteremia   . RUQ abdominal pain   . Gross hematuria   . Other pancytopenia ([place])   . Sepsis ([place]) 03/21/2017  . Diarrhea 03/03/2017  . Venous stasis ulcers of both lower extremities ([place]) 08/07/2015  . Varicose veins of lower extremities with complications 44/31/5400  . Malfunction of arteriovenous dialysis fistula ([place])   . CHF (congestive heart failure) ([place])  06/23/2015  . Lymphedema distichiasis syndrome with kidney disease and diabetes mellitus ([place]) 04/06/2015  . Lymphedema of lower extremity 04/04/2015  . DM (diabetes mellitus), type 2 with renal complications ([place]) [birth year]1950  . MGUS (monoclonal gammopathy of unknown significance) 06/12/2014  . Special screening for malignant neoplasms, colon 07/09/2013  . Proliferative diabetic retinopathy ([place]) 10/23/2012  . Knee pain 08/21/2012  . Mass of thigh 08/21/2012  . FEVER UNSPECIFIED 05/06/2010  . ORGANIC IMPOTENCE 12/02/2009  . ONYCHOMYCOSIS 07/17/2009  . Lymphedema 05/06/2009  . ESRD (end stage renal disease) ([place]) 06/09/2008  . Hyperlipidemia 03/19/2007  . OBESITY, MORBID 02/08/2007  . Essential hypertension 02/08/2007  . Atherosclerosis of native arteries of extremity with intermittent claudication ([place]) 02/08/2007    Past Surgical History:  Procedure Laterality Date  . AMPUTATION     Right and left fifth toes.   . AMPUTATION TOE     emoval of both little toes  . AV FISTULA PLACEMENT Left 03/21/2014   Procedure: ARTERIOVENOUS (AV) FISTULA CREATION with ultrasound;  Surgeon: [name], MD;  Location: [facility];  Service: Vascular;  Laterality: Left;  . COLONOSCOPY    . EYE SURGERY Bilateral    cataract and lens implant  . HERNIA REPAIR    . LIGATION OF COMPETING BRANCHES OF ARTERIOVENOUS FISTULA Left 06/29/2015   Procedure: LIGATION  OF LEFT ARM RADIOCEPHALIC ARTERIOVENOUS FISTULA SIDE BRANCHES;  Surgeon: Conrad Rock Hill, MD;  Location: [facility];  Service: Vascular;  Laterality: Left;  [name] MULTIPLE EXTRACTIONS WITH ALVEOLOPLASTY N/A 04/07/2017   Procedure: Extraction of tooth #'s 1-11, 13, 14,16, 20-23, and 26-28 with alveoloplasty;  Surgeon: Lenn Cal, DDS;  Location: [facility];  Service: Oral Surgery;  Laterality: N/A;  . Popliteal to posterior tibial bypass     2006  . R knee arthoscopic repair of meniscus    . UMBILICAL HERNIA REPAIR      Current Outpatient Rx  . Order #:  [id number] Class: Historical Med  . Order #: [id number] Class: Historical Med  . Order #: [id number] Class: Historical Med  . Order #: [id number] Class: Normal  . Order #: [id number] Class: Normal  . Order #: [id number] Class: Normal  . Order #: [id number] Class: Normal  . Order #: [id number] Class: Print  . Order #: [id number] Class: Historical Med  . Order #: [id number] Class: Print  . Order #: [id number] Class: Normal    Allergies Shrimp [shellfish allergy]  Family History  Problem Relation Age of Onset  . Diabetes Mother   . Heart disease Mother   . Diabetes Brother     Social History Social History  Substance Use Topics  . Smoking status: Former Smoker    Quit date: 06/06/1979  . Smokeless tobacco: Former Systems developer  . Alcohol use No     Comment:   "years ago" , none now    Review of Systems  Constitutional: No fever/chills Eyes: No visual changes. ENT: No sore throat. Positive mouth bleeding.  Cardiovascular: Denies chest pain. Respiratory: Denies shortness of breath. Gastrointestinal: No abdominal pain.  No nausea, no vomiting.  No diarrhea.  No constipation. Genitourinary: Negative for dysuria. Musculoskeletal: Negative for back pain. Skin: Negative for rash. Neurological: Negative for headaches, focal weakness or numbness.  10-point ROS otherwise negative.  ____________________________________________   PHYSICAL EXAM:  VITAL SIGNS: ED Triage Vitals  Enc Vitals Group     BP 04/16/17 2030 (!) 158/74     Pulse Rate 04/16/17 2030 72     Resp 04/16/17 2030 18     Temp 04/16/17 2030 99.2 F (37.3 C)     Temp Source 04/16/17 2030 Oral     SpO2 04/16/17 2030 100 %     Weight 04/16/17 2030 262 lb (118.8 kg)     Height 04/16/17 2030 5\' 11"  (1.803 m)   Constitutional: Alert and oriented. Well appearing and in no acute distress. Hold rag in mouth soaked with blood.  Eyes: Conjunctivae are normal.  Head: Atraumatic. Nose: No congestion/rhinnorhea. Mouth/Throat: Mucous membranes  are moist. Adentulous. Brisk bright red oozing from the lower incisors.  Neck: No stridor.  Cardiovascular: Normal rate, regular rhythm. Good peripheral circulation. Grossly normal heart sounds.   Respiratory: Normal respiratory effort.  No retractions. Lungs CTAB. Gastrointestinal: Soft and nontender. No distention.  Musculoskeletal: No lower extremity tenderness nor edema. No gross deformities of extremities. Neurologic:  Normal speech and language. No gross focal neurologic deficits are appreciated.  Skin:  Skin is warm, dry and intact. No rash noted.  ____________________________________________   LABS (all labs ordered are listed, but only abnormal results are displayed)  Labs Reviewed  CBC WITH DIFFERENTIAL/PLATELET - Abnormal; Notable for the following:       Result Value   RBC 3.09 (*)    Hemoglobin 9.3 (*)    HCT 28.5 (*)    RDW 16.9 (*)    All other components within normal limits  COMPREHENSIVE METABOLIC PANEL - Abnormal; Notable for the following:    Sodium 132 (*)    Chloride 92 (*)    Glucose, Bld 150 (*)    BUN 34 (*)    Creatinine, Ser 10.02 (*)    Total Protein 8.4 (*)    Albumin 3.3 (*)    GFR calc non Af Amer 5 (*)    GFR calc Af Amer 5 (*)    All other components within normal limits  TYPE AND SCREEN  ABO/RH   ____________________________________________   PROCEDURES  Procedure(s) performed:   Procedures  Oral Bleeding Management Date/Time: 04/17/2017 at 9:39 AM Performed by: Margette Fast Authorized by: Wonda Olds Chenise Mulvihill Consent: Verbal consent obtained.  Risks and benefits: risks, benefits and alternatives were discussed Consent given by: patient Required items: required blood products, implants, devices, and special equipment available Patient sedated: no Repair method: rolled up 2x2 gauze soaked in tranexamic acid (about 5 mL / 500 mg) inserted into the affected bleeding site with constant direct pressure x 2. With continued oozing blood the  site Surgicil was applied to the lower gingiva and bleeding was stopped with good clot formation.  Post-procedure assessment: bleeding stopped after 45 minutes with gauze in place  Treatment complexity: Complex Patient tolerance: Patient tolerated the procedure well with no immediate complications   ____________________________________________   INITIAL IMPRESSION / ASSESSMENT AND PLAN / ED COURSE  Pertinent labs & imaging results that were available during my care of the patient were reviewed by me and considered in my medical decision making (see chart for details).  Patient has oozing blood in the lower gums with suture in place. Will apply TXA on gauze with direct pressure and reassess.   12:02 AM Tried to reach dentistry by phone but was unsuccessful. Wound is now hemostatic with application of surguicil and gauze. Clot is formed with no active bleeding or oozing. Patient to see dentist in the AM for previously scheduled appointment.   At this time, I do not feel there is any life-threatening condition present. I have reviewed and discussed all results (EKG, imaging, lab, urine as appropriate), exam findings with patient. I have reviewed nursing notes and appropriate previous records.  I feel the patient is safe to be discharged home without further emergent workup. Discussed usual and customary return precautions. Patient and family (if present) verbalize understanding and are comfortable with this plan.  Patient will follow-up with their primary care provider. If they do not have a primary care provider, information for follow-up has been provided to them. All questions have been answered.  ____________________________________________  FINAL CLINICAL IMPRESSION(S) / ED DIAGNOSES  Final diagnoses:  Wound dehiscence  Bleeding gums     MEDICATIONS GIVEN DURING THIS VISIT:  Medications  tranexamic acid (CYKLOKAPRON) injection 500 mg (500 mg Topical Given 04/16/17 2200)     NEW  OUTPATIENT MEDICATIONS STARTED DURING THIS VISIT:  None   Note:  This document was prepared using Dragon voice recognition software and may include unintentional dictation errors.  Nanda Quinton, MD Emergency Medicine   Scotlynn Noyes, [facility] Old[facility], MD 04/17/17 [phone]

## 2017-04-16 NOTE — ED Triage Notes (Signed)
Pt comes after having all of his teeth removed at [facility] on the 27th of April. Scheduled for suture removal tomorrow at 0830.  Reports continuous bleeding since about an hour ago. Upon assessment patient's blood continuously filling with blood. Reports being a dialysis patient.  Last dialysis was yesterday.

## 2017-04-17 ENCOUNTER — Ambulatory Visit (HOSPITAL_COMMUNITY): Payer: Self-pay | Admitting: Dentistry

## 2017-04-17 ENCOUNTER — Encounter (HOSPITAL_COMMUNITY): Payer: Self-pay | Admitting: Dentistry

## 2017-04-17 VITALS — BP 171/71 | HR 66 | Temp 98.0°F

## 2017-04-17 DIAGNOSIS — K08109 Complete loss of teeth, unspecified cause, unspecified class: Secondary | ICD-10-CM

## 2017-04-17 DIAGNOSIS — R7881 Bacteremia: Secondary | ICD-10-CM

## 2017-04-17 DIAGNOSIS — K08199 Complete loss of teeth due to other specified cause, unspecified class: Secondary | ICD-10-CM

## 2017-04-17 DIAGNOSIS — N186 End stage renal disease: Secondary | ICD-10-CM

## 2017-04-17 DIAGNOSIS — K0820 Unspecified atrophy of edentulous alveolar ridge: Secondary | ICD-10-CM

## 2017-04-17 LAB — CULTURE, BLOOD (ROUTINE X 2)
Culture: NO GROWTH
Culture: NO GROWTH
SPECIAL REQUESTS: ADEQUATE
Special Requests: ADEQUATE

## 2017-04-17 NOTE — Patient Instructions (Signed)
PLAN: 1. Continue salt water rinses as needed to aid healing 2. Advance diet as tolerated up to a soft diet only. 3. Nutritional supplementation with Nepro as per nephrology team 4. Return to clinic in 1 month for evaluation of healing prior to follow-up with a dentist of his choice for fabrication of upper and lower complete dentures. 5. Call if problems arise.   Lenn Cal, DDS

## 2017-04-17 NOTE — Progress Notes (Signed)
POST OPERATIVE NOTE:  04/17/2017 [name] [id number]  VITALS: BP (!) 171/71 (BP Location: Right Arm)   Pulse 66   Temp 98 F (36.7 C) (Oral)   LABS:  Lab Results  Component Value Date   WBC 9.8 04/16/2017   HGB 9.3 (L) 04/16/2017   HCT 28.5 (L) 04/16/2017   MCV 92.2 04/16/2017   PLT 243 04/16/2017   BMET    Component Value Date/Time   NA 132 (L) 04/16/2017 2200   NA 138 03/03/2017 1635   NA 142 07/03/2014 0814   K 4.0 04/16/2017 2200   K 4.4 07/03/2014 0814   CL 92 (L) 04/16/2017 2200   CO2 28 04/16/2017 2200   CO2 20 (L) 07/03/2014 0814   GLUCOSE 150 (H) 04/16/2017 2200   GLUCOSE 136 07/03/2014 0814   BUN 34 (H) 04/16/2017 2200   BUN 36 (H) 03/03/2017 1635   BUN 93.6 (H) 07/03/2014 0814   CREATININE 10.02 (H) 04/16/2017 2200   CREATININE 7.3 (HH) 07/03/2014 0814   CALCIUM 9.6 04/16/2017 2200   CALCIUM 8.6 07/03/2014 0814   GFRNONAA 5 (L) 04/16/2017 2200   GFRNONAA 31 (L) 08/16/2012 1517   GFRAA 5 (L) 04/16/2017 2200   GFRAA 36 (L) 08/16/2012 1517    Lab Results  Component Value Date   INR 1.15 04/12/2017   INR 1.51 03/22/2017   INR 1.40 03/21/2017   No results found for: PTT   [name] is status post extraction of remaining teeth with alveoloplasty in the operating room with general anesthesia on 04/07/2017. Patient now presents for evaluation of healing and suture removal. Patient does indicate that he was admitted at [facility] for evaluation and treatment of urinary tract infection. Patient was discharged on Saturday, 04/15/2017.  Patient also gives a history of bleeding from the lower left anterior extraction site brought on by eating food in this area. Patient went to the emergency room for evaluation and treatment.  SUBJECTIVE: Patient without complaints from dental extraction sites. Patient denies having any active bleeding since late last night.  EXAM: There is no sign of infection, heme, or ooze. There is a clot present in  the area of tooth numbers 20 through 22.  Sutures are loosely intact. Patient is now edentulous. There is atrophy of the edentulous alveolar ridges. Patient is healing in by generalized primary closure but is healing in by secondary intention and several areas.   PROCEDURE: The patient was given a chlorhexidine gluconate rinse for 30 seconds. Sutures were then removed without complication. Patient tolerated the procedure well.  ASSESSMENT: Post operative course is consistent with dental procedures performed in the operating room with general anesthesia. History of bleeding from lower left extraction site-questionably due to trauma from chewing food. Patient is edentulous There is atrophy of the edentulous alveolar ridges.  PLAN: 1. Continue salt water rinses as needed to aid healing 2. Advance diet as tolerated up to a soft diet only. 3. Nutritional supplementation with Nepro as per nephrology team 4. Return to clinic in 1 month for evaluation of healing prior to follow-up with a dentist of his choice for fabrication of upper and lower complete dentures. 5. Call if problems arise.   Lenn Cal, DDS

## 2017-04-17 NOTE — Discharge Instructions (Signed)
You were seen in the ED today with bleeding from the site of a tooth extraction. We tried multiple techniques to stop the bleeding and were successful. You should see you dentist first thing tomorrow morning. Return to the ED with any new or worsening bleeding.

## 2017-04-25 ENCOUNTER — Inpatient Hospital Stay: Payer: Medicare Other | Admitting: Family Medicine

## 2017-04-25 ENCOUNTER — Ambulatory Visit (INDEPENDENT_AMBULATORY_CARE_PROVIDER_SITE_OTHER): Payer: Medicare Other | Admitting: Family Medicine

## 2017-04-25 ENCOUNTER — Encounter: Payer: Self-pay | Admitting: Family Medicine

## 2017-04-25 DIAGNOSIS — N481 Balanitis: Secondary | ICD-10-CM | POA: Insufficient documentation

## 2017-04-25 NOTE — Patient Instructions (Signed)
It was a pleasure seeing you today in our clinic. Today we discussed your skin infection. Here is the treatment plan we have discussed and agreed upon together:   - Continue using the clotrimazole cream as prescribed. - I would like to see you back in the office in 3 months or sooner if anything comes up. - Continue taking your medications as prescribed.

## 2017-04-25 NOTE — Progress Notes (Signed)
   HPI  CC: Hospital follow-up Patient is here for hospital follow-up. He states that he was seen most recently due to concern for a UTI and balanitis. He states that since that visit he feels well. He is been applying the prescribed clotrimazole cream twice a day and the swelling around his penis is gotten significantly better. He denies any fevers, chills, dysuria, hematuria, flank pain, abdominal pain, headache, nausea, vomiting, diarrhea, penile discharge, or penile pain.  Review of Systems See HPI for ROS.   CC, SH/smoking status, and VS noted  Objective: BP (!) 140/58   Pulse 71   Temp 98.2 F (36.8 C) (Oral)   Ht 5\' 11"  (1.803 m)   Wt 266 lb 6.4 oz (120.8 kg)   SpO2 99%   BMI 37.16 kg/m  Gen: NAD, alert, cooperative, and pleasant. CV: RRR, no murmur Resp: CTAB, no wheezes, non-labored Male genitalia: Penis: circumcised and Slight edema/erythema consistent with improved balanitis Urethral Meatus: normal Testicles: normal, no masses Scrotum: normal   Assessment and plan:  Balanitis Patient was seen in the ED due to penile swelling and discomfort consistent with balanitis. He is prescribed clotrimazole and has been applying this twice a day. Symptoms have improved considerably and patient states that he feels well. - Continue clotrimazole - Return precautions discussed   [name], MD,MS,  PGY3 04/25/2017 4:25 PM

## 2017-04-25 NOTE — Assessment & Plan Note (Signed)
Patient was seen in the ED due to penile swelling and discomfort consistent with balanitis. He is prescribed clotrimazole and has been applying this twice a day. Symptoms have improved considerably and patient states that he feels well. - Continue clotrimazole - Return precautions discussed

## 2017-04-28 ENCOUNTER — Encounter: Payer: Self-pay | Admitting: Vascular Surgery

## 2017-04-28 ENCOUNTER — Ambulatory Visit: Payer: Medicare Other | Admitting: Gastroenterology

## 2017-04-28 ENCOUNTER — Other Ambulatory Visit: Payer: Self-pay

## 2017-05-04 ENCOUNTER — Ambulatory Visit (INDEPENDENT_AMBULATORY_CARE_PROVIDER_SITE_OTHER)
Admission: RE | Admit: 2017-05-04 | Discharge: 2017-05-04 | Disposition: A | Discharge status: Home / Self Care | Payer: Medicare Other | Source: Ambulatory Visit | Attending: Vascular Surgery | Admitting: Vascular Surgery

## 2017-05-04 ENCOUNTER — Ambulatory Visit (HOSPITAL_COMMUNITY)
Admission: RE | Admit: 2017-05-04 | Discharge: 2017-05-04 | Disposition: A | Discharge status: Home / Self Care | Payer: Medicare Other | Source: Ambulatory Visit | Attending: Vascular Surgery | Admitting: Vascular Surgery

## 2017-05-04 ENCOUNTER — Encounter: Payer: Self-pay | Admitting: Vascular Surgery

## 2017-05-04 ENCOUNTER — Ambulatory Visit (INDEPENDENT_AMBULATORY_CARE_PROVIDER_SITE_OTHER): Payer: Medicare Other | Admitting: Vascular Surgery

## 2017-05-04 VITALS — BP 152/73 | HR 65 | Temp 98.1°F | Resp 20 | Ht 71.0 in | Wt 260.0 lb

## 2017-05-04 DIAGNOSIS — Z95828 Presence of other vascular implants and grafts: Secondary | ICD-10-CM | POA: Insufficient documentation

## 2017-05-04 DIAGNOSIS — N186 End stage renal disease: Secondary | ICD-10-CM | POA: Diagnosis not present

## 2017-05-04 DIAGNOSIS — Z4889 Encounter for other specified surgical aftercare: Secondary | ICD-10-CM

## 2017-05-04 DIAGNOSIS — Z992 Dependence on renal dialysis: Secondary | ICD-10-CM | POA: Diagnosis not present

## 2017-05-04 DIAGNOSIS — I739 Peripheral vascular disease, unspecified: Secondary | ICD-10-CM | POA: Diagnosis not present

## 2017-05-04 DIAGNOSIS — Z48812 Encounter for surgical aftercare following surgery on the circulatory system: Secondary | ICD-10-CM | POA: Diagnosis present

## 2017-05-04 NOTE — Progress Notes (Signed)
Patient is a 66 year old male who returns for follow-up today. He underwent a right popliteal to posterior tibial artery bypass in 2006 for nonhealing wounds and had fifth toe amputations at that point. He underwent right lower extremity arteriogram in 2006 and 2009 after suggestion of graft narrowing on duplex exam. There was no evidence of narrowing on either exam. He denies any claudication symptoms currently. He has no wounds on his feet currently. He has chronic skin thickening and changes from venous stasis of the been present since prior to his bypass. He currently is on hemodialysis and dialyzes Tuesday Thursday and Saturday.  Past Medical History:  Diagnosis Date  . Arthritis   . Asthma    as a child  . CHF (congestive heart failure) ([place])   . Chronic cystitis   . Chronic kidney disease   . Diabetes mellitus   . Dry skin   . Dyspnea   . Dysrhythmia   . GERD (gastroesophageal reflux disease)   . H/O hiatal hernia    states it's been fixed  . Hyperlipidemia   . Hypertension   . Lymphedema   . Morbid obesity ([place])   . NEPHROLITHIASIS, HX OF 12/02/2009   Qualifier: Diagnosis of  By: [name], Cat    . PVD (peripheral vascular disease) ([place])   . Renal insufficiency   . UMBILICAL HERNIA 0/63/0160   Qualifier: History of  By: [name] MD, Audelia Acton    . Venous insufficiency     Past Surgical History:  Procedure Laterality Date  . AMPUTATION     Right and left fifth toes.   . AMPUTATION TOE     emoval of both little toes  . AV FISTULA PLACEMENT Left 03/21/2014   Procedure: ARTERIOVENOUS (AV) FISTULA CREATION with ultrasound;  Surgeon: [name], MD;  Location: [facility];  Service: Vascular;  Laterality: Left;  . COLONOSCOPY    . EYE SURGERY Bilateral    cataract and lens implant  . HERNIA REPAIR    . LIGATION OF COMPETING BRANCHES OF ARTERIOVENOUS FISTULA Left 06/29/2015   Procedure: LIGATION OF LEFT ARM RADIOCEPHALIC ARTERIOVENOUS FISTULA SIDE BRANCHES;  Surgeon: Conrad , MD;   Location: [facility];  Service: Vascular;  Laterality: Left;  [name] MULTIPLE EXTRACTIONS WITH ALVEOLOPLASTY N/A 04/07/2017   Procedure: Extraction of tooth #'s 1-11, 13, 14,16, 20-23, and 26-28 with alveoloplasty;  Surgeon: Lenn Cal, DDS;  Location: [facility];  Service: Oral Surgery;  Laterality: N/A;  . Popliteal to posterior tibial bypass     2006  . R knee arthoscopic repair of meniscus    . UMBILICAL HERNIA REPAIR     Current Outpatient Prescriptions on File Prior to Visit  Medication Sig Dispense Refill  . amLODipine (NORVASC) 10 MG tablet Take 10 mg by mouth daily.    [name] aspirin EC 81 MG tablet Take 81 mg by mouth every morning.    . calcium acetate (PHOSLO) 667 MG capsule Take 2,001-2,668 mg by mouth See admin instructions. Takes 4 capsules three times a day with meals and 3 capsules with each snacks.    . carvedilol (COREG) 6.25 MG tablet Take 1 tablet (6.25 mg total) by mouth 2 (two) times daily with a meal. 60 tablet 1  . cloNIDine (CATAPRES - DOSED IN MG/24 HR) 0.1 mg/24hr patch Place 1 patch (0.1 mg total) onto the skin once a week. 4 patch 5  . clotrimazole (LOTRIMIN) 1 % cream Apply 1 application topically 2 (two) times daily. 30 g 0  .  furosemide (LASIX) 80 MG tablet Take 1 tablet (80 mg total) by mouth 2 (two) times daily. 60 tablet 2  . oxyCODONE-acetaminophen (PERCOCET) 5-325 MG tablet Take one or two tablets by mouth every 6 hours as needed for pain. 40 tablet 0  . pantoprazole (PROTONIX) 40 MG tablet Take 40 mg by mouth daily.  0  . urea 10 % lotion Apply topically 2 (two) times daily. 237 mL 0   No current facility-administered medications on file prior to visit.     Allergies  Allergen Reactions  . Shrimp [Shellfish Allergy] Swelling    Physical exam:  Vitals:   05/04/17 1544  BP: (!) 152/73  Pulse: 65  Resp: 20  Temp: 98.1 F (36.7 C)  TempSrc: Oral  SpO2: 98%  Weight: 260 lb (117.9 kg)  Height: 5\' 11"  (1.803 m)    Extremities: 2+ femoral pulses difficult  to palpate pedal pulses due to severely thickened skin in  Skin: No ulcerations  Data: Patient had a duplex ultrasound today which showed possible narrowing of the distal anastomosis. Additionally his ABI on the right side has fallen from 12.65. His left side ABI was 0.67.  Assessment: Possible vein graft stenosis distally accompanied by 40% drop in ABI over the last couple years  Plan: Aortogram lower extremity runoff possible intervention scheduled for June 8. Risks benefits possible complications and procedure details were discussed with the patient today including but not limited to bleeding infection vessel injury contrast reaction. He understands and agrees to proceed.  Ruta Hinds, MD [facility] Office: [phone] Pager: [phone]

## 2017-05-11 ENCOUNTER — Other Ambulatory Visit: Payer: Self-pay | Admitting: *Deleted

## 2017-05-12 DIAGNOSIS — E440 Moderate protein-calorie malnutrition: Secondary | ICD-10-CM | POA: Insufficient documentation

## 2017-05-12 NOTE — Addendum Note (Signed)
Addendum  created 05/12/17 1424 by Jahari Billy, MD   Sign clinical note    

## 2017-05-12 NOTE — Addendum Note (Signed)
Addendum  created 05/12/17 1347 by Rica Koyanagi, MD   Sign clinical note

## 2017-05-15 ENCOUNTER — Ambulatory Visit (HOSPITAL_COMMUNITY): Payer: Self-pay | Admitting: Dentistry

## 2017-05-19 ENCOUNTER — Encounter (HOSPITAL_COMMUNITY): Payer: Self-pay | Admitting: *Deleted

## 2017-05-19 ENCOUNTER — Ambulatory Visit (HOSPITAL_COMMUNITY)
Admission: RE | Disposition: A | Discharge status: Home / Self Care | Payer: Self-pay | Source: Ambulatory Visit | Attending: Vascular Surgery

## 2017-05-19 ENCOUNTER — Ambulatory Visit (HOSPITAL_COMMUNITY)
Admission: RE | Admit: 2017-05-19 | Discharge: 2017-05-19 | Disposition: A | Discharge status: Home / Self Care | Payer: Medicare Other | Source: Ambulatory Visit | Attending: Vascular Surgery | Admitting: Vascular Surgery

## 2017-05-19 DIAGNOSIS — I130 Hypertensive heart and chronic kidney disease with heart failure and stage 1 through stage 4 chronic kidney disease, or unspecified chronic kidney disease: Secondary | ICD-10-CM | POA: Insufficient documentation

## 2017-05-19 DIAGNOSIS — Z87442 Personal history of urinary calculi: Secondary | ICD-10-CM | POA: Diagnosis not present

## 2017-05-19 DIAGNOSIS — Z79899 Other long term (current) drug therapy: Secondary | ICD-10-CM | POA: Diagnosis not present

## 2017-05-19 DIAGNOSIS — Z6836 Body mass index (BMI) 36.0-36.9, adult: Secondary | ICD-10-CM | POA: Insufficient documentation

## 2017-05-19 DIAGNOSIS — M1990 Unspecified osteoarthritis, unspecified site: Secondary | ICD-10-CM | POA: Insufficient documentation

## 2017-05-19 DIAGNOSIS — E1122 Type 2 diabetes mellitus with diabetic chronic kidney disease: Secondary | ICD-10-CM | POA: Insufficient documentation

## 2017-05-19 DIAGNOSIS — Z9889 Other specified postprocedural states: Secondary | ICD-10-CM | POA: Diagnosis not present

## 2017-05-19 DIAGNOSIS — Z91013 Allergy to seafood: Secondary | ICD-10-CM | POA: Insufficient documentation

## 2017-05-19 DIAGNOSIS — I872 Venous insufficiency (chronic) (peripheral): Secondary | ICD-10-CM | POA: Diagnosis not present

## 2017-05-19 DIAGNOSIS — E1151 Type 2 diabetes mellitus with diabetic peripheral angiopathy without gangrene: Secondary | ICD-10-CM | POA: Insufficient documentation

## 2017-05-19 DIAGNOSIS — Z89421 Acquired absence of other right toe(s): Secondary | ICD-10-CM | POA: Insufficient documentation

## 2017-05-19 DIAGNOSIS — Z7982 Long term (current) use of aspirin: Secondary | ICD-10-CM | POA: Insufficient documentation

## 2017-05-19 DIAGNOSIS — I878 Other specified disorders of veins: Secondary | ICD-10-CM | POA: Diagnosis present

## 2017-05-19 DIAGNOSIS — N189 Chronic kidney disease, unspecified: Secondary | ICD-10-CM | POA: Insufficient documentation

## 2017-05-19 DIAGNOSIS — K219 Gastro-esophageal reflux disease without esophagitis: Secondary | ICD-10-CM | POA: Insufficient documentation

## 2017-05-19 DIAGNOSIS — E785 Hyperlipidemia, unspecified: Secondary | ICD-10-CM | POA: Insufficient documentation

## 2017-05-19 DIAGNOSIS — I509 Heart failure, unspecified: Secondary | ICD-10-CM | POA: Insufficient documentation

## 2017-05-19 DIAGNOSIS — Z89422 Acquired absence of other left toe(s): Secondary | ICD-10-CM | POA: Insufficient documentation

## 2017-05-19 DIAGNOSIS — I739 Peripheral vascular disease, unspecified: Secondary | ICD-10-CM | POA: Diagnosis not present

## 2017-05-19 HISTORY — PX: ABDOMINAL AORTOGRAM W/LOWER EXTREMITY: CATH118223

## 2017-05-19 LAB — POCT I-STAT, CHEM 8
BUN: 30 mg/dL — ABNORMAL HIGH (ref 6–20)
CALCIUM ION: 1.17 mmol/L (ref 1.15–1.40)
CHLORIDE: 99 mmol/L — AB (ref 101–111)
CREATININE: 7.6 mg/dL — AB (ref 0.61–1.24)
GLUCOSE: 127 mg/dL — AB (ref 65–99)
HCT: 33 % — ABNORMAL LOW (ref 39.0–52.0)
Hemoglobin: 11.2 g/dL — ABNORMAL LOW (ref 13.0–17.0)
Potassium: 3.8 mmol/L (ref 3.5–5.1)
Sodium: 139 mmol/L (ref 135–145)
TCO2: 34 mmol/L (ref 0–100)

## 2017-05-19 LAB — GLUCOSE, CAPILLARY: Glucose-Capillary: 150 mg/dL — ABNORMAL HIGH (ref 65–99)

## 2017-05-19 SURGERY — ABDOMINAL AORTOGRAM W/LOWER EXTREMITY
Anesthesia: LOCAL

## 2017-05-19 MED ORDER — HYDRALAZINE HCL 20 MG/ML IJ SOLN
INTRAMUSCULAR | Status: AC
  Filled 2017-05-19: qty 1

## 2017-05-19 MED ORDER — HYDRALAZINE HCL 20 MG/ML IJ SOLN: 5.0000 mg | INTRAMUSCULAR | Status: DC | PRN

## 2017-05-19 MED ORDER — METOPROLOL TARTRATE 5 MG/5ML IV SOLN: 2.0000 mg | INTRAVENOUS | Status: DC | PRN

## 2017-05-19 MED ORDER — ONDANSETRON HCL 4 MG/2ML IJ SOLN: 4.0000 mg | Freq: Four times a day (QID) | INTRAMUSCULAR | Status: DC | PRN

## 2017-05-19 MED ORDER — DIPHENHYDRAMINE HCL 50 MG/ML IJ SOLN
25.0000 mg | INTRAMUSCULAR | Status: AC
  Administered 2017-05-19: 25 mg via INTRAVENOUS

## 2017-05-19 MED ORDER — ACETAMINOPHEN 325 MG RE SUPP: 325.0000 mg | RECTAL | Status: DC | PRN

## 2017-05-19 MED ORDER — HEPARIN (PORCINE) IN NACL 2-0.9 UNIT/ML-% IJ SOLN
INTRAMUSCULAR | Status: AC | PRN
  Administered 2017-05-19: 1000 mL

## 2017-05-19 MED ORDER — FAMOTIDINE IN NACL 20-0.9 MG/50ML-% IV SOLN
20.0000 mg | INTRAVENOUS | Status: AC
  Administered 2017-05-19: 20 mg via INTRAVENOUS

## 2017-05-19 MED ORDER — METHYLPREDNISOLONE SODIUM SUCC 125 MG IJ SOLR
INTRAMUSCULAR | Status: AC
  Administered 2017-05-19: 125 mg via INTRAVENOUS
  Filled 2017-05-19: qty 2

## 2017-05-19 MED ORDER — IODIXANOL 320 MG/ML IV SOLN
INTRAVENOUS | Status: DC | PRN
  Administered 2017-05-19: 185 mL via INTRAVENOUS

## 2017-05-19 MED ORDER — HYDRALAZINE HCL 20 MG/ML IJ SOLN: 10.0000 mg | Freq: Once | INTRAMUSCULAR | Status: DC

## 2017-05-19 MED ORDER — ACETAMINOPHEN 325 MG PO TABS: 325.0000 mg | ORAL_TABLET | ORAL | Status: DC | PRN

## 2017-05-19 MED ORDER — SODIUM CHLORIDE 0.9% FLUSH: 3.0000 mL | Freq: Two times a day (BID) | INTRAVENOUS | Status: DC

## 2017-05-19 MED ORDER — LIDOCAINE HCL 1 % IJ SOLN
INTRAMUSCULAR | Status: AC
  Filled 2017-05-19: qty 20

## 2017-05-19 MED ORDER — SODIUM CHLORIDE 0.9% FLUSH: 3.0000 mL | INTRAVENOUS | Status: DC | PRN

## 2017-05-19 MED ORDER — HYDRALAZINE HCL 20 MG/ML IJ SOLN
INTRAMUSCULAR | Status: DC | PRN
  Administered 2017-05-19: 10 mg via INTRAVENOUS

## 2017-05-19 MED ORDER — LIDOCAINE HCL (PF) 1 % IJ SOLN
INTRAMUSCULAR | Status: DC | PRN
  Administered 2017-05-19: 30 mL via INTRADERMAL

## 2017-05-19 MED ORDER — FAMOTIDINE IN NACL 20-0.9 MG/50ML-% IV SOLN
INTRAVENOUS | Status: AC
  Administered 2017-05-19: 20 mg via INTRAVENOUS
  Filled 2017-05-19: qty 50

## 2017-05-19 MED ORDER — PANTOPRAZOLE SODIUM 40 MG PO TBEC: 40.0000 mg | DELAYED_RELEASE_TABLET | Freq: Every day | ORAL | Status: DC

## 2017-05-19 MED ORDER — LABETALOL HCL 5 MG/ML IV SOLN
10.0000 mg | INTRAVENOUS | Status: DC | PRN
  Administered 2017-05-19: 10 mg via INTRAVENOUS

## 2017-05-19 MED ORDER — METHYLPREDNISOLONE SODIUM SUCC 125 MG IJ SOLR
125.0000 mg | INTRAMUSCULAR | Status: AC
  Administered 2017-05-19: 125 mg via INTRAVENOUS

## 2017-05-19 MED ORDER — HEPARIN (PORCINE) IN NACL 2-0.9 UNIT/ML-% IJ SOLN
INTRAMUSCULAR | Status: AC
  Filled 2017-05-19: qty 1000

## 2017-05-19 MED ORDER — MORPHINE SULFATE (PF) 10 MG/ML IV SOLN: 2.0000 mg | INTRAVENOUS | Status: DC | PRN

## 2017-05-19 MED ORDER — LABETALOL HCL 5 MG/ML IV SOLN
INTRAVENOUS | Status: AC
  Administered 2017-05-19: 10 mg via INTRAVENOUS
  Filled 2017-05-19: qty 4

## 2017-05-19 MED ORDER — SODIUM CHLORIDE 0.9 % IV SOLN: 250.0000 mL | INTRAVENOUS | Status: DC | PRN

## 2017-05-19 MED ORDER — DIPHENHYDRAMINE HCL 50 MG/ML IJ SOLN
INTRAMUSCULAR | Status: AC
  Administered 2017-05-19: 25 mg via INTRAVENOUS
  Filled 2017-05-19: qty 1

## 2017-05-19 SURGICAL SUPPLY — 9 items
CATH ANGIO 5F PIGTAIL 65CM (CATHETERS) ×1 IMPLANT
COVER PRB 48X5XTLSCP FOLD TPE (BAG) IMPLANT
COVER PROBE 5X48 (BAG) ×2
KIT PV (KITS) ×2 IMPLANT
SHEATH PINNACLE 5F 10CM (SHEATH) ×1 IMPLANT
SYR MEDRAD MARK V 150ML (SYRINGE) ×2 IMPLANT
TRANSDUCER W/STOPCOCK (MISCELLANEOUS) ×2 IMPLANT
TRAY PV CATH (CUSTOM PROCEDURE TRAY) ×2 IMPLANT
WIRE HITORQ VERSACORE ST 145CM (WIRE) ×1 IMPLANT

## 2017-05-19 NOTE — Op Note (Signed)
Procedure: Abdominal aortogram with bilateral lower extremity runoff  Preoperative diagnosis: Vein graft stenosis  postoperative diagnosis: No significant vein graft stenosis  Anesthesia: Local  Operative findings: #1 widely patent popliteal to posterior tibial bypass with no significant narrowing on 2 views  Operative details: After obtaining informed consent, patient taken the fibula. The patient was placed in supine position Angio table. Both groins were prepped and draped in usual sterile fashion. Local anesthesia was ensured near the left common femoral artery. Ultrasound was used to identify the left common femoral artery and femoral bifurcation. An introducer needle was then used to cannulate the left common femoral artery and an 035 versacore wire threaded up into the abdominal aorta under fluoroscopic guidance. A 5 French sheath was placed over the guidewire into the left common femoral artery and thoroughly flushed with heparin saline. A 5 French pigtail catheter was then advanced over the guidewire and abdominal aortogram was obtained in AP projection. Left and right renal arteries are patent. Infrarenal abdominal aorta is patent. The left and right common internal and external iliac arteries are patent. Next the apical catheter was pulled down just above the aortic bifurcation and magnified view of the pelvis was performed which confirmed the above findings.  At this point bilateral lower extremity runoff views were performed.  In the left lower extremity, the left common femoral profunda femoris and superficial femoral arteries are patent. There is occlusion of the proximal portion of all 3 tibial vessels. There is reconstitution of the peroneal which is a single runoff vessel to the left foot.  In the right lower extremity, the right common femoral profunda femoris and superficial femoral arteries are patent. There is a bypass graft extending from the above-knee popliteal artery to the  posterior tibial artery at the level of the ankle. This widely patent. A lateral view of the foot was also performed to confirm this on 2 separate views. There is no significant narrowing of the distal anastomosis and there is one-vessel runoff via the posterior tibial artery into the foot. At this point a 5 French pigtail catheter was removed over a guidewire. The 5 French sheath was left in place to be pulled in the holding area.  Patient tolerated procedure well and there were, locations.  Operative management: The patient has a widely patent popliteal to posterior tibial bypass. No intervention necessary. The patient will follow-up with [place] in 1 year with a graft duplex scan ABIs.  Ruta Hinds, MD [facility] Office: [phone] Pager: [phone]

## 2017-05-19 NOTE — Progress Notes (Addendum)
Site area: left groin fa sheath Site Prior to Removal:  Level 0 Pressure Applied For:  20 minutes Manual:   yes Patient Status During Pull:  stable Post Pull Site:  Level 0 Post Pull Instructions Given:  yes Post Pull Pulses Present: dopplered Dressing Applied:  Gauze and tegaderm Bedrest begins @  6429 Comments:

## 2017-05-19 NOTE — H&P (View-Only) (Signed)
Patient is a 66 year old male who returns for follow-up today. He underwent a right popliteal to posterior tibial artery bypass in 2006 for nonhealing wounds and had fifth toe amputations at that point. He underwent right lower extremity arteriogram in 2006 and 2009 after suggestion of graft narrowing on duplex exam. There was no evidence of narrowing on either exam. He denies any claudication symptoms currently. He has no wounds on his feet currently. He has chronic skin thickening and changes from venous stasis of the been present since prior to his bypass. He currently is on hemodialysis and dialyzes Tuesday Thursday and Saturday.  Past Medical History:  Diagnosis Date  . Arthritis   . Asthma    as a child  . CHF (congestive heart failure) ([place])   . Chronic cystitis   . Chronic kidney disease   . Diabetes mellitus   . Dry skin   . Dyspnea   . Dysrhythmia   . GERD (gastroesophageal reflux disease)   . H/O hiatal hernia    states it's been fixed  . Hyperlipidemia   . Hypertension   . Lymphedema   . Morbid obesity ([place])   . NEPHROLITHIASIS, HX OF 12/02/2009   Qualifier: Diagnosis of  By: [name], Cat    . PVD (peripheral vascular disease) ([place])   . Renal insufficiency   . UMBILICAL HERNIA 0/25/4270   Qualifier: History of  By: [name] MD, Audelia Acton    . Venous insufficiency     Past Surgical History:  Procedure Laterality Date  . AMPUTATION     Right and left fifth toes.   . AMPUTATION TOE     emoval of both little toes  . AV FISTULA PLACEMENT Left 03/21/2014   Procedure: ARTERIOVENOUS (AV) FISTULA CREATION with ultrasound;  Surgeon: [name], MD;  Location: [facility];  Service: Vascular;  Laterality: Left;  . COLONOSCOPY    . EYE SURGERY Bilateral    cataract and lens implant  . HERNIA REPAIR    . LIGATION OF COMPETING BRANCHES OF ARTERIOVENOUS FISTULA Left 06/29/2015   Procedure: LIGATION OF LEFT ARM RADIOCEPHALIC ARTERIOVENOUS FISTULA SIDE BRANCHES;  Surgeon: Conrad Bend, MD;   Location: [facility];  Service: Vascular;  Laterality: Left;  [name] MULTIPLE EXTRACTIONS WITH ALVEOLOPLASTY N/A 04/07/2017   Procedure: Extraction of tooth #'s 1-11, 13, 14,16, 20-23, and 26-28 with alveoloplasty;  Surgeon: Lenn Cal, DDS;  Location: [facility];  Service: Oral Surgery;  Laterality: N/A;  . Popliteal to posterior tibial bypass     2006  . R knee arthoscopic repair of meniscus    . UMBILICAL HERNIA REPAIR     Current Outpatient Prescriptions on File Prior to Visit  Medication Sig Dispense Refill  . amLODipine (NORVASC) 10 MG tablet Take 10 mg by mouth daily.    [name] aspirin EC 81 MG tablet Take 81 mg by mouth every morning.    . calcium acetate (PHOSLO) 667 MG capsule Take 2,001-2,668 mg by mouth See admin instructions. Takes 4 capsules three times a day with meals and 3 capsules with each snacks.    . carvedilol (COREG) 6.25 MG tablet Take 1 tablet (6.25 mg total) by mouth 2 (two) times daily with a meal. 60 tablet 1  . cloNIDine (CATAPRES - DOSED IN MG/24 HR) 0.1 mg/24hr patch Place 1 patch (0.1 mg total) onto the skin once a week. 4 patch 5  . clotrimazole (LOTRIMIN) 1 % cream Apply 1 application topically 2 (two) times daily. 30 g 0  .  furosemide (LASIX) 80 MG tablet Take 1 tablet (80 mg total) by mouth 2 (two) times daily. 60 tablet 2  . oxyCODONE-acetaminophen (PERCOCET) 5-325 MG tablet Take one or two tablets by mouth every 6 hours as needed for pain. 40 tablet 0  . pantoprazole (PROTONIX) 40 MG tablet Take 40 mg by mouth daily.  0  . urea 10 % lotion Apply topically 2 (two) times daily. 237 mL 0   No current facility-administered medications on file prior to visit.     Allergies  Allergen Reactions  . Shrimp [Shellfish Allergy] Swelling    Physical exam:  Vitals:   05/04/17 1544  BP: (!) 152/73  Pulse: 65  Resp: 20  Temp: 98.1 F (36.7 C)  TempSrc: Oral  SpO2: 98%  Weight: 260 lb (117.9 kg)  Height: 5\' 11"  (1.803 m)    Extremities: 2+ femoral pulses difficult  to palpate pedal pulses due to severely thickened skin in  Skin: No ulcerations  Data: Patient had a duplex ultrasound today which showed possible narrowing of the distal anastomosis. Additionally his ABI on the right side has fallen from 12.65. His left side ABI was 0.67.  Assessment: Possible vein graft stenosis distally accompanied by 40% drop in ABI over the last couple years  Plan: Aortogram lower extremity runoff possible intervention scheduled for June 8. Risks benefits possible complications and procedure details were discussed with the patient today including but not limited to bleeding infection vessel injury contrast reaction. He understands and agrees to proceed.  Ruta Hinds, MD [facility] Office: [phone] Pager: [phone]

## 2017-05-19 NOTE — Interval H&P Note (Signed)
History and Physical Interval Note:  05/19/2017 7:39 AM  [name]  has presented today for surgery, with the diagnosis of claudication, pvd  The various methods of treatment have been discussed with the patient and family. After consideration of risks, benefits and other options for treatment, the patient has consented to  Procedure(s): Abdominal Aortogram w/Lower Extremity (N/A) as a surgical intervention .  The patient's history has been reviewed, patient examined, no change in status, stable for surgery.  I have reviewed the patient's chart and labs.  Questions were answered to the patient's satisfaction.     Ruta Hinds

## 2017-05-19 NOTE — Discharge Instructions (Signed)

## 2017-05-22 ENCOUNTER — Encounter (HOSPITAL_COMMUNITY): Payer: Self-pay | Admitting: Vascular Surgery

## 2017-05-22 MED FILL — Hydralazine HCl Inj 20 MG/ML: INTRAMUSCULAR | Qty: 1 | Status: AC

## 2017-05-25 ENCOUNTER — Encounter: Payer: Self-pay | Admitting: Cardiology

## 2017-05-25 NOTE — Progress Notes (Signed)
Cardiology Office Note   Date:  05/26/2017   ID:  [name], DOB [birth year], MRN [id number]  PCP:  [name], MD  Cardiologist:   Minus Breeding, MD  Chief Complaint  Patient presents with  . Palpitations      History of Present Illness: [name] is a 66 y.o. male who presents for follow up of atrail fib.    He was last seen in this clinic in March of 2015.  I saw him last in 2014.  He has had a past history for DOE and had a myoview negative for ischemia in Jan 2015.   He has been treated for diastolic heart failure.  We saw him in April in the hospital when he was admitted for sepsis.   I reviewed these records for this visit.  He had PAF suspected in the ED  during that visit although we did not see this on tele.  He was going to have a TEE but they were unable to place the probe .  He had an echo recently with a normal EF and evidence of diastolic dysfunction. An event monitor demonstrated bradycardia but no atrial fib.   He says that he is feeling well.  The patient denies any new symptoms such as chest discomfort, neck or arm discomfort. There has been no new shortness of breath, PND or orthopnea. There have been no reported palpitations, presyncope or syncope.  He goes to dialysis and does well with fluid with this.    Past Medical History:  Diagnosis Date  . Arthritis   . Asthma    as a child  . Chronic cystitis   . Chronic diastolic heart failure ([name])   . Chronic kidney disease    HD pt, 3 times a week.  . Diabetes mellitus   . Dysrhythmia   . GERD (gastroesophageal reflux disease)   . H/O hiatal hernia    states it's been fixed  . Hyperlipidemia   . Hypertension   . Lymphedema   . Morbid obesity ([name])   . NEPHROLITHIASIS, HX OF 12/02/2009   Qualifier: Diagnosis of  By: [name], Cat    . PVD (peripheral vascular disease) ([place])   . Renal insufficiency   . UMBILICAL HERNIA 9/50/9326   Qualifier: History of  By: Patrick Barthel MD, [name]    . Venous  insufficiency     Past Surgical History:  Procedure Laterality Date  . ABDOMINAL AORTOGRAM W/LOWER EXTREMITY N/A 05/19/2017   Procedure: Abdominal Aortogram w/Lower Extremity;  Surgeon: Patrick Dutch, MD;  Location: [facility];  Service: Cardiovascular;  Laterality: N/A;  . AMPUTATION     Right and left fifth toes.   . AMPUTATION TOE     emoval of both little toes  . AV FISTULA PLACEMENT Left 03/21/2014   Procedure: ARTERIOVENOUS (AV) FISTULA CREATION with ultrasound;  Surgeon: Patrick Posner, MD;  Location: [facility];  Service: Vascular;  Laterality: Left;  . COLONOSCOPY    . EYE SURGERY Bilateral    cataract and lens implant  . HERNIA REPAIR    . LIGATION OF COMPETING BRANCHES OF ARTERIOVENOUS FISTULA Left 06/29/2015   Procedure: LIGATION OF LEFT ARM RADIOCEPHALIC ARTERIOVENOUS FISTULA SIDE BRANCHES;  Surgeon: Patrick Des Plaines, MD;  Location: [facility];  Service: Vascular;  Laterality: Left;  [name] MULTIPLE EXTRACTIONS WITH ALVEOLOPLASTY N/A 04/07/2017   Procedure: Extraction of tooth #'s 1-11, 13, 14,16, 20-23, and 26-28 with alveoloplasty;  Surgeon: Patrick Glatter  [name], DDS;  Location: [facility];  Service: Oral Surgery;  Laterality: N/A;  . Popliteal to posterior tibial bypass     2006  . R knee arthoscopic repair of meniscus    . UMBILICAL HERNIA REPAIR       Current Outpatient Prescriptions  Medication Sig Dispense Refill  . acetaminophen (TYLENOL) 500 MG tablet Take 1,000 mg by mouth daily as needed for moderate pain or headache.    [name] amLODipine (NORVASC) 10 MG tablet Take 10 mg by mouth at bedtime.     [name] aspirin EC 81 MG tablet Take 81 mg by mouth daily.     . calcium acetate (PHOSLO) 667 MG capsule Take 2,001-2,668 mg by mouth See admin instructions. Takes 4 capsules three times a day with meals and 3 capsules with each snacks.    . carvedilol (COREG) 6.25 MG tablet Take 1 tablet (6.25 mg total) by mouth 2 (two) times daily with a meal. 60 tablet 1  . clotrimazole (LOTRIMIN) 1 % cream  Apply 1 application topically 2 (two) times daily. (Patient taking differently: Apply 1 application topically daily. ) 30 g 0  . furosemide (LASIX) 80 MG tablet Take 1 tablet (80 mg total) by mouth 2 (two) times daily. 60 tablet 2  . multivitamin (RENA-VIT) TABS tablet Take 1 tablet by mouth daily.    [name] oxyCODONE-acetaminophen (PERCOCET) 5-325 MG tablet Take one or two tablets by mouth every 6 hours as needed for pain. 40 tablet 0  . urea 10 % lotion Apply topically 2 (two) times daily. (Patient taking differently: Apply 1 application topically 2 (two) times a week. ) 237 mL 0   Current Facility-Administered Medications  Medication Dose Route Frequency Provider Last Rate Last Dose  . hydrALAZINE (APRESOLINE) injection 10 mg  10 mg Intravenous Once Patrick Dutch, MD        Allergies:   Shrimp [shellfish allergy]    ROS:  Please see the history of present illness.   Otherwise, review of systems are positive for none.   All other systems are reviewed and negative.    PHYSICAL EXAM: VS:  BP (!) 162/70   Pulse 64   Ht 5\' 11"  (1.803 m)   Wt 265 lb (120.2 kg)   BMI 36.96 kg/m  , BMI Body mass index is 36.96 kg/m. GENERAL:  Well appearing HEENT:  Pupils equal round and reactive, fundi not visualized, oral mucosa unremarkable NECK:  No jugular venous distention, waveform within normal limits, carotid upstroke brisk and symmetric, left bruit, no thyromegaly LYMPHATICS:  No cervical, inguinal adenopathy LUNGS:  Clear to auscultation bilaterally BACK:  No CVA tenderness CHEST:  Unremarkable HEART:  PMI not displaced or sustained,S1 and S2 within normal limits, no S3, no S4, no clicks, no rubs, 2/6 apical systolic murmur at the apex, no diastoic murmurs ABD:  Flat, positive bowel sounds normal in frequency in pitch, no bruits, no rebound, no guarding, no midline pulsatile mass, no hepatomegaly, no splenomegaly EXT:  2 plus pulses throughout, mild edema, no cyanosis no clubbing, left forearm  fistula SKIN:  No rashes no nodules NEURO:  Cranial nerves II through XII grossly intact, motor grossly intact throughout PSYCH:  Cognitively intact, oriented to person place and time    EKG:  EKG is not ordered today.    Recent Labs: 03/22/2017: TSH 3.436 04/13/2017: Magnesium 2.0 04/16/2017: ALT 22; Platelets 243 05/19/2017: BUN 30; Creatinine, Ser 7.60; Hemoglobin 11.2; Potassium 3.8; Sodium 139    Lipid Panel  Component Value Date/Time   CHOL 184 03/13/2017 0908   TRIG 201 (H) 03/13/2017 0908   HDL 29 (L) 03/13/2017 0908   CHOLHDL 6.3 (H) 03/13/2017 0908   CHOLHDL 6.2 08/16/2012 1517   VLDL 50 (H) 08/16/2012 1517   LDLCALC 115 (H) 03/13/2017 0908      Wt Readings from Last 3 Encounters:  05/26/17 265 lb (120.2 kg)  05/19/17 252 lb (114.3 kg)  05/04/17 260 lb (117.9 kg)      Other studies Reviewed: Additional studies/ records that were reviewed today include: Hospital records, event monitor and echo Review of the above records demonstrates:  Please see elsewhere in the note.     ASSESSMENT AND PLAN:   ATRIAL FIB:  I don't see any evidence of this.  This was thought to be related to sepsis and wasn't evident during most of his hospitalization. He was not sent home on anticoagulation. I don't think there is any indication for an Pia Mau this point. No further imaging is indicated.  DIASTOLIC HEART FAILURE:  Volume is managed her dialysis. No change in therapy is indicated.  PVD:  He was recently seen by Dr. Oneida Alar.  He had a widely patent popliteal to posterior tibial bypass.  He has no overt symptoms.  No further imaging is planned  MORBID OBESITY:  He has changed diet and is careful.   He has lost over 100lbs !!!!  CKD:  Per renal.    Current medicines are reviewed at length with the patient today.  The patient does not have concerns regarding medicines.  The following changes have been made:  no change  Labs/ tests ordered today include: None No orders of  the defined types were placed in this encounter.    Disposition:   FU with me as needed.      Signed, Minus Breeding, MD  05/26/2017 12:47 PM    [facility]

## 2017-05-26 ENCOUNTER — Telehealth: Payer: Self-pay | Admitting: Vascular Surgery

## 2017-05-26 ENCOUNTER — Ambulatory Visit (INDEPENDENT_AMBULATORY_CARE_PROVIDER_SITE_OTHER): Payer: Medicare Other | Admitting: Cardiology

## 2017-05-26 ENCOUNTER — Encounter: Payer: Self-pay | Admitting: Cardiology

## 2017-05-26 VITALS — BP 162/70 | HR 64 | Ht 71.0 in | Wt 265.0 lb

## 2017-05-26 DIAGNOSIS — I5032 Chronic diastolic (congestive) heart failure: Secondary | ICD-10-CM

## 2017-05-26 DIAGNOSIS — I480 Paroxysmal atrial fibrillation: Secondary | ICD-10-CM

## 2017-05-26 NOTE — Telephone Encounter (Signed)
-----   Message from Mena Goes, RN sent at 05/19/2017  8:50 AM EDT ----- Regarding: 1 year w/ labs   ----- Message ----- From: [name], MD Sent: 05/19/2017   8:21 AM To: [facility]vs Charge Pool  [place] left groin.  Aortogram with bilat runoff   The patient can follow-up with Vinnie Level in 1 year with a graft duplex scan and ABIs.  Ruta Hinds, MD [facility] Office: [phone] Pager: [phone]

## 2017-05-26 NOTE — Telephone Encounter (Signed)
Sched appt 05/31/18; lab at 10:00 and NP at 11:00. Mailed appt letter.

## 2017-05-26 NOTE — Patient Instructions (Signed)
Medication Instructions:  Continue current medications  Labwork: None Ordered  Testing/Procedures: None Ordered  Follow-Up: Your physician recommends that you schedule a follow-up appointment in: As Needed   Any Other Special Instructions Will Be Listed Below (If Applicable).   If you need a refill on your cardiac medications before your next appointment, please call your pharmacy.   

## 2017-05-28 ENCOUNTER — Other Ambulatory Visit: Payer: Self-pay | Admitting: Family Medicine

## 2017-06-06 ENCOUNTER — Ambulatory Visit: Payer: Medicare Other | Admitting: Sports Medicine

## 2017-06-13 ENCOUNTER — Encounter: Payer: Self-pay | Admitting: Sports Medicine

## 2017-06-13 ENCOUNTER — Ambulatory Visit (INDEPENDENT_AMBULATORY_CARE_PROVIDER_SITE_OTHER): Payer: Medicare Other | Admitting: Sports Medicine

## 2017-06-13 DIAGNOSIS — N186 End stage renal disease: Secondary | ICD-10-CM

## 2017-06-13 DIAGNOSIS — I890 Lymphedema, not elsewhere classified: Secondary | ICD-10-CM

## 2017-06-13 DIAGNOSIS — B351 Tinea unguium: Secondary | ICD-10-CM | POA: Diagnosis not present

## 2017-06-13 DIAGNOSIS — Z992 Dependence on renal dialysis: Secondary | ICD-10-CM

## 2017-06-13 DIAGNOSIS — M79671 Pain in right foot: Secondary | ICD-10-CM

## 2017-06-13 DIAGNOSIS — M79672 Pain in left foot: Secondary | ICD-10-CM

## 2017-06-13 DIAGNOSIS — E1122 Type 2 diabetes mellitus with diabetic chronic kidney disease: Secondary | ICD-10-CM | POA: Diagnosis not present

## 2017-06-13 NOTE — Progress Notes (Signed)
Patient ID: [name], male   DOB: Jun 18, 1951, 66 y.o.   MRN: [id number]  Subjective: [name] is a 66 y.o. male patient with history of type 2 diabetes who returns to office today complaining of long, painful nails  while ambulating in shoes; unable to trim. FBS not recorded. On HD 3x/wk. Patient reports he was in hospital for bactererima and had to have all his teeth pulled.   Patient goes to wound care center for legs; being treated for lymphedema and ulceration with compression wraps to both legs. He changes dressings himself 2x weekly as previous.   Patient Active Problem List   Diagnosis Date Noted  . Paroxysmal atrial fibrillation ([name]) 05/26/2017  . Balanitis 04/25/2017  . Penile swelling   . Scrotal swelling   . UTI (urinary tract infection) 04/13/2017  . Anemia of chronic disease   . Acute encephalopathy 04/12/2017  . Urinary retention 04/10/2017  . Bacteremia   . RUQ abdominal pain   . Gross hematuria   . Other pancytopenia ([name])   . Sepsis ([name]) 03/21/2017  . Diarrhea 03/03/2017  . Venous stasis ulcers of both lower extremities ([name]) 08/07/2015  . Varicose veins of lower extremities with complications 56/38/7564  . Malfunction of arteriovenous dialysis fistula ([name])   . CHF (congestive heart failure) ([name]) 06/23/2015  . Lymphedema distichiasis syndrome with kidney disease and diabetes mellitus ([name]) 04/06/2015  . Lymphedema of lower extremity 04/04/2015  . DM (diabetes mellitus), type 2 with renal complications ([name]) 33/29/5188  . MGUS (monoclonal gammopathy of unknown significance) 06/12/2014  . Special screening for malignant neoplasms, colon 07/09/2013  . Proliferative diabetic retinopathy ([name]) 10/23/2012  . Knee pain 08/21/2012  . Mass of thigh 08/21/2012  . FEVER UNSPECIFIED 05/06/2010  . ORGANIC IMPOTENCE 12/02/2009  . ONYCHOMYCOSIS 07/17/2009  . Lymphedema 05/06/2009  . ESRD (end stage renal disease) ([name]) 06/09/2008  . Hyperlipidemia 03/19/2007   . OBESITY, MORBID 02/08/2007  . Essential hypertension 02/08/2007  . Atherosclerosis of native arteries of extremity with intermittent claudication ([name]) 02/08/2007   Current Outpatient Prescriptions on File Prior to Visit  Medication Sig Dispense Refill  . acetaminophen (TYLENOL) 500 MG tablet Take 1,000 mg by mouth daily as needed for moderate pain or headache.    [name] amLODipine (NORVASC) 10 MG tablet Take 10 mg by mouth at bedtime.     [name] aspirin EC 81 MG tablet Take 81 mg by mouth daily.     . calcium acetate (PHOSLO) 667 MG capsule Take 2,001-2,668 mg by mouth See admin instructions. Takes 4 capsules three times a day with meals and 3 capsules with each snacks.    . carvedilol (COREG) 6.25 MG tablet take 1 tablet by mouth twice a day with meals 180 tablet 3  . clotrimazole (LOTRIMIN) 1 % cream Apply 1 application topically 2 (two) times daily. (Patient taking differently: Apply 1 application topically daily. ) 30 g 0  . furosemide (LASIX) 80 MG tablet Take 1 tablet (80 mg total) by mouth 2 (two) times daily. 60 tablet 2  . multivitamin (RENA-VIT) TABS tablet Take 1 tablet by mouth daily.    [name] oxyCODONE-acetaminophen (PERCOCET) 5-325 MG tablet Take one or two tablets by mouth every 6 hours as needed for pain. 40 tablet 0  . urea 10 % lotion Apply topically 2 (two) times daily. (Patient taking differently: Apply 1 application topically 2 (two) times a week. ) 237 mL 0   Current Facility-Administered Medications on File Prior to Visit  Medication  Dose Route Frequency Provider Last Rate Last Dose  . hydrALAZINE (APRESOLINE) injection 10 mg  10 mg Intravenous Once [name], MD       Allergies  Allergen Reactions  . Shrimp [Shellfish Allergy] Swelling    Recent Results (from the past 2160 hour(s))  Comprehensive metabolic panel     Status: Abnormal   Collection Time: 03/21/17  2:50 PM  Result Value Ref Range   Sodium 138 135 - 145 mmol/L   Potassium 4.1 3.5 - 5.1 mmol/L    Chloride 99 (L) 101 - 111 mmol/L   CO2 26 22 - 32 mmol/L   Glucose, Bld 123 (H) 65 - 99 mg/dL   BUN 37 (H) 6 - 20 mg/dL   Creatinine, Ser 10.82 (H) 0.61 - 1.24 mg/dL   Calcium 8.5 (L) 8.9 - 10.3 mg/dL   Total Protein 7.0 6.5 - 8.1 g/dL   Albumin 2.9 (L) 3.5 - 5.0 g/dL   AST 29 15 - 41 U/L   ALT 15 (L) 17 - 63 U/L   Alkaline Phosphatase 57 38 - 126 U/L   Total Bilirubin 1.1 0.3 - 1.2 mg/dL   GFR calc non Af Amer 4 (L) >60 mL/min   GFR calc Af Amer 5 (L) >60 mL/min    Comment: (NOTE) The eGFR has been calculated using the CKD EPI equation. This calculation has not been validated in all clinical situations. eGFR's persistently <60 mL/min signify possible Chronic Kidney Disease.    Anion gap 13 5 - 15  CBC with Differential     Status: Abnormal   Collection Time: 03/21/17  2:50 PM  Result Value Ref Range   WBC 4.9 4.0 - 10.5 K/uL   RBC 3.58 (L) 4.22 - 5.81 MIL/uL   Hemoglobin 10.9 (L) 13.0 - 17.0 g/dL   HCT 33.9 (L) 39.0 - 52.0 %   MCV 94.7 78.0 - 100.0 fL   MCH 30.4 26.0 - 34.0 pg   MCHC 32.2 30.0 - 36.0 g/dL   RDW 16.5 (H) 11.5 - 15.5 %   Platelets 63 (L) 150 - 400 K/uL    Comment: REPEATED TO VERIFY PLATELET COUNT CONFIRMED BY SMEAR    Neutrophils Relative % 90 %   Neutro Abs 4.4 1.7 - 7.7 K/uL   Lymphocytes Relative 7 %   Lymphs Abs 0.4 (L) 0.7 - 4.0 K/uL   Monocytes Relative 2 %   Monocytes Absolute 0.1 0.1 - 1.0 K/uL   Eosinophils Relative 1 %   Eosinophils Absolute 0.1 0.0 - 0.7 K/uL   Basophils Relative 0 %   Basophils Absolute 0.0 0.0 - 0.1 K/uL  Protime-INR     Status: Abnormal   Collection Time: 03/21/17  2:50 PM  Result Value Ref Range   Prothrombin Time 17.3 (H) 11.4 - 15.2 seconds   INR 1.40   Culture, blood (Routine x 2)     Status: Abnormal   Collection Time: 03/21/17  2:55 PM  Result Value Ref Range   Specimen Description BLOOD RIGHT ANTECUBITAL    Special Requests BOTTLES DRAWN AEROBIC AND ANAEROBIC  BCAV    Culture  Setup Time      GRAM POSITIVE  COCCI IN CHAINS IN PAIRS AEROBIC BOTTLE ONLY Organism ID to follow CRITICAL RESULT CALLED TO, READ BACK BY AND VERIFIED WITH: A MEYER PHARMD [id number] 03/22/17 A BROWNING    Culture (A)     ROTHIA SPECIES Standardized susceptibility testing for this organism is not available.    Report  Status 03/24/2017 FINAL   Blood Culture ID Panel (Reflexed)     Status: None   Collection Time: 03/21/17  2:55 PM  Result Value Ref Range   Enterococcus species NOT DETECTED NOT DETECTED   Listeria monocytogenes NOT DETECTED NOT DETECTED   Staphylococcus species NOT DETECTED NOT DETECTED   Staphylococcus aureus NOT DETECTED NOT DETECTED   Streptococcus species NOT DETECTED NOT DETECTED   Streptococcus agalactiae NOT DETECTED NOT DETECTED   Streptococcus pneumoniae NOT DETECTED NOT DETECTED   Streptococcus pyogenes NOT DETECTED NOT DETECTED   Acinetobacter baumannii NOT DETECTED NOT DETECTED   Enterobacteriaceae species NOT DETECTED NOT DETECTED   Enterobacter cloacae complex NOT DETECTED NOT DETECTED   Escherichia coli NOT DETECTED NOT DETECTED   Klebsiella oxytoca NOT DETECTED NOT DETECTED   Klebsiella pneumoniae NOT DETECTED NOT DETECTED   Proteus species NOT DETECTED NOT DETECTED   Serratia marcescens NOT DETECTED NOT DETECTED   Haemophilus influenzae NOT DETECTED NOT DETECTED   Neisseria meningitidis NOT DETECTED NOT DETECTED   Pseudomonas aeruginosa NOT DETECTED NOT DETECTED   Candida albicans NOT DETECTED NOT DETECTED   Candida glabrata NOT DETECTED NOT DETECTED   Candida krusei NOT DETECTED NOT DETECTED   Candida parapsilosis NOT DETECTED NOT DETECTED   Candida tropicalis NOT DETECTED NOT DETECTED  Culture, blood (Routine x 2)     Status: None   Collection Time: 03/21/17  3:00 PM  Result Value Ref Range   Specimen Description BLOOD RIGHT HAND    Special Requests BOTTLES DRAWN AEROBIC ONLY  BCAV    Culture NO GROWTH 5 DAYS    Report Status 03/26/2017 FINAL   I-Stat CG4 Lactic Acid, ED      Status: Abnormal   Collection Time: 03/21/17  3:07 PM  Result Value Ref Range   Lactic Acid, Venous 3.60 (HH) 0.5 - 1.9 mmol/L   Comment NOTIFIED PHYSICIAN   I-Stat CG4 Lactic Acid, ED     Status: Abnormal   Collection Time: 03/21/17  5:23 PM  Result Value Ref Range   Lactic Acid, Venous 3.35 (HH) 0.5 - 1.9 mmol/L   Comment NOTIFIED PHYSICIAN   Urinalysis, Routine w reflex microscopic     Status: Abnormal   Collection Time: 03/21/17  8:46 PM  Result Value Ref Range   Color, Urine AMBER (A) YELLOW    Comment: BIOCHEMICALS MAY BE AFFECTED BY COLOR   APPearance TURBID (A) CLEAR   Specific Gravity, Urine 1.013 1.005 - 1.030   pH 7.0 5.0 - 8.0   Glucose, UA NEGATIVE NEGATIVE mg/dL   Hgb urine dipstick MODERATE (A) NEGATIVE   Bilirubin Urine NEGATIVE NEGATIVE   Ketones, ur NEGATIVE NEGATIVE mg/dL   Protein, ur 100 (A) NEGATIVE mg/dL   Nitrite NEGATIVE NEGATIVE   Leukocytes, UA MODERATE (A) NEGATIVE   RBC / HPF TOO NUMEROUS TO COUNT 0 - 5 RBC/hpf   WBC, UA TOO NUMEROUS TO COUNT 0 - 5 WBC/hpf   Bacteria, UA MANY (A) NONE SEEN   Squamous Epithelial / LPF 6-30 (A) NONE SEEN   Non Squamous Epithelial 0-5 (A) NONE SEEN  Lactic acid, plasma     Status: Abnormal   Collection Time: 03/21/17  9:56 PM  Result Value Ref Range   Lactic Acid, Venous 2.8 (HH) 0.5 - 1.9 mmol/L    Comment: CRITICAL RESULT CALLED TO, READ BACK BY AND VERIFIED WITH: OSEI,L RN 03/21/2017 [id number] JORDANS   Troponin I (q 6hr x 3)     Status: Abnormal   Collection  Time: 03/21/17 10:47 PM  Result Value Ref Range   Troponin I 0.45 (HH) <0.03 ng/mL    Comment: CRITICAL RESULT CALLED TO, READ BACK BY AND VERIFIED WITH: OSEI,L RN 03/21/2017 2359 JORDANS   Glucose, capillary     Status: Abnormal   Collection Time: 03/21/17 10:54 PM  Result Value Ref Range   Glucose-Capillary 104 (H) 65 - 99 mg/dL  HIV antibody     Status: None   Collection Time: 03/22/17 12:25 AM  Result Value Ref Range   HIV Screen 4th Generation wRfx  Non Reactive Non Reactive    Comment: (NOTE) Performed At: [facility] [place], [place] [id number] Lindon Romp MD [id number]   Lactic acid, plasma     Status: Abnormal   Collection Time: 03/22/17 12:25 AM  Result Value Ref Range   Lactic Acid, Venous 2.4 (HH) 0.5 - 1.9 mmol/L    Comment: CRITICAL RESULT CALLED TO, READ BACK BY AND VERIFIED WITH: OSEI,L RN 03/22/2017 0130 JORDANS   Basic metabolic panel     Status: Abnormal   Collection Time: 03/22/17 12:25 AM  Result Value Ref Range   Sodium 136 135 - 145 mmol/L   Potassium 5.1 3.5 - 5.1 mmol/L    Comment: DELTA CHECK NOTED   Chloride 99 (L) 101 - 111 mmol/L   CO2 24 22 - 32 mmol/L   Glucose, Bld 144 (H) 65 - 99 mg/dL   BUN 49 (H) 6 - 20 mg/dL   Creatinine, Ser 11.44 (H) 0.61 - 1.24 mg/dL   Calcium 8.1 (L) 8.9 - 10.3 mg/dL   GFR calc non Af Amer 4 (L) >60 mL/min   GFR calc Af Amer 5 (L) >60 mL/min    Comment: (NOTE) The eGFR has been calculated using the CKD EPI equation. This calculation has not been validated in all clinical situations. eGFR's persistently <60 mL/min signify possible Chronic Kidney Disease.    Anion gap 13 5 - 15  CBC     Status: Abnormal   Collection Time: 03/22/17 12:25 AM  Result Value Ref Range   WBC 3.5 (L) 4.0 - 10.5 K/uL   RBC 3.08 (L) 4.22 - 5.81 MIL/uL   Hemoglobin 9.4 (L) 13.0 - 17.0 g/dL   HCT 29.3 (L) 39.0 - 52.0 %   MCV 95.1 78.0 - 100.0 fL   MCH 30.5 26.0 - 34.0 pg   MCHC 32.1 30.0 - 36.0 g/dL   RDW 16.7 (H) 11.5 - 15.5 %   Platelets 26 (LL) 150 - 400 K/uL    Comment: REPEATED TO VERIFY SPECIMEN CHECKED FOR CLOTS CRITICAL RESULT CALLED TO, READ BACK BY AND VERIFIED WITH: K.ROMPFORD,RN 0121 03/22/17 G.[email or web address]   MRSA PCR Screening     Status: None   Collection Time: 03/22/17  5:23 AM  Result Value Ref Range   MRSA by PCR NEGATIVE NEGATIVE    Comment:        The GeneXpert MRSA Assay (FDA approved for NASAL specimens only), is one component of  a comprehensive MRSA colonization surveillance program. It is not intended to diagnose MRSA infection nor to guide or monitor treatment for MRSA infections.   TSH     Status: None   Collection Time: 03/22/17  6:01 AM  Result Value Ref Range   TSH 3.436 0.350 - 4.500 uIU/mL    Comment: Performed by a 3rd Generation assay with a functional sensitivity of <=0.01 uIU/mL.  Troponin I (q 6hr x 3)  Status: Abnormal   Collection Time: 03/22/17  6:40 AM  Result Value Ref Range   Troponin I 0.46 (HH) <0.03 ng/mL    Comment: CRITICAL VALUE NOTED.  VALUE IS CONSISTENT WITH PREVIOUSLY REPORTED AND CALLED VALUE.  Lactic acid, plasma     Status: None   Collection Time: 03/22/17  6:40 AM  Result Value Ref Range   Lactic Acid, Venous 1.3 0.5 - 1.9 mmol/L  DIC (disseminated intravasc coag) panel     Status: Abnormal   Collection Time: 03/22/17  9:22 AM  Result Value Ref Range   Prothrombin Time 18.3 (H) 11.4 - 15.2 seconds   INR 1.51    aPTT 39 (H) 24 - 36 seconds    Comment:        IF BASELINE aPTT IS ELEVATED, SUGGEST PATIENT RISK ASSESSMENT BE USED TO DETERMINE APPROPRIATE ANTICOAGULANT THERAPY.    Fibrinogen 726 (H) 210 - 475 mg/dL   D-Dimer, Quant 3.91 (H) 0.00 - 0.50 ug/mL-FEU    Comment: (NOTE) At the manufacturer cut-off of 0.50 ug/mL FEU, this assay has been documented to exclude PE with a sensitivity and negative predictive value of 97 to 99%.  At this time, this assay has not been approved by the FDA to exclude DVT/VTE. Results should be correlated with clinical presentation.    Platelets 25 (LL) 150 - 400 K/uL    Comment: PLATELET COUNT CONFIRMED BY SMEAR CRITICAL VALUE NOTED.  VALUE IS CONSISTENT WITH PREVIOUSLY REPORTED AND CALLED VALUE. REPEATED TO VERIFY    Smear Review NO SCHISTOCYTES SEEN   Troponin I (q 6hr x 3)     Status: Abnormal   Collection Time: 03/22/17 12:07 PM  Result Value Ref Range   Troponin I 0.42 (HH) <0.03 ng/mL    Comment: CRITICAL VALUE NOTED.   VALUE IS CONSISTENT WITH PREVIOUSLY REPORTED AND CALLED VALUE.  Lactic acid, plasma     Status: None   Collection Time: 03/22/17 12:07 PM  Result Value Ref Range   Lactic Acid, Venous 1.5 0.5 - 1.9 mmol/L  Reticulocytes     Status: Abnormal   Collection Time: 03/22/17 12:07 PM  Result Value Ref Range   Retic Ct Pct 1.3 0.4 - 3.1 %   RBC. 3.01 (L) 4.22 - 5.81 MIL/uL   Retic Count, Absolute 39.1 19.0 - 186.0 K/uL  Haptoglobin     Status: None   Collection Time: 03/22/17 12:07 PM  Result Value Ref Range   Haptoglobin 162 34 - 200 mg/dL    Comment: (NOTE) Performed At: [facility] [address] [place], [place] [id number] Lindon Romp MD [id number]   Lactate dehydrogenase     Status: None   Collection Time: 03/22/17 12:07 PM  Result Value Ref Range   LDH 154 98 - 192 U/L  Magnesium     Status: None   Collection Time: 03/22/17 12:07 PM  Result Value Ref Range   Magnesium 1.9 1.7 - 2.4 mg/dL  Vitamin B12     Status: None   Collection Time: 03/22/17 12:07 PM  Result Value Ref Range   Vitamin B-12 266 180 - 914 pg/mL    Comment: (NOTE) This assay is not validated for testing neonatal or myeloproliferative syndrome specimens for Vitamin B12 levels.   Folate RBC     Status: Abnormal   Collection Time: 03/22/17 12:07 PM  Result Value Ref Range   Folate, Hemolysate 271.5 Not Estab. ng/mL   Hematocrit 26.6 (L) 37.5 - 51.0 %   Folate,  RBC 1,021 >498 ng/mL    Comment: (NOTE) Performed At: [facility] [place], [place] [id number] Lindon Romp MD [id number]   Heparin induced platelet Ab (HIT antibody)     Status: None   Collection Time: 03/22/17 12:07 PM  Result Value Ref Range   Heparin Induced Plt Ab 0.275 0.000 - 0.400 OD    Comment: (NOTE) Performed At: [facility] [place], [place] [id number] Lindon Romp MD [id number]   Protein electrophoresis, serum     Status: Abnormal   Collection Time:  03/22/17 12:07 PM  Result Value Ref Range   Total Protein ELP 5.7 (L) 6.0 - 8.5 g/dL   Albumin ELP 2.6 (L) 2.9 - 4.4 g/dL   Alpha-1-Globulin 0.3 0.0 - 0.4 g/dL   Alpha-2-Globulin 0.8 0.4 - 1.0 g/dL   Beta Globulin 0.6 (L) 0.7 - 1.3 g/dL   Gamma Globulin 1.4 0.4 - 1.8 g/dL   M-Spike, % 0.8 (H) Not Observed g/dL   SPE Interp. Comment     Comment: (NOTE) The SPE pattern demonstrates a single peak (M-spike) in the gamma region which may represent monoclonal protein. This peak may also be caused by circulating immune complexes, cryoglobulins, C-reactive protein, fibrinogen or hemolysis.  If clinically indicated, the presence of a monoclonal gammopathy may be confirmed by immuno- fixation, as well as an evaluation of the urine for the presence of Bence-Jones protein. Performed At: [facility] [place], [place] [id number] Lindon Romp MD HW:[id number]    Comment Comment     Comment: (NOTE) Protein electrophoresis scan will follow via computer, mail, or courier delivery.    GLOBULIN, TOTAL 3.1 2.2 - 3.9 g/dL   A/G Ratio 0.8 0.7 - 1.7  Urine culture     Status: Abnormal   Collection Time: 03/22/17  2:20 PM  Result Value Ref Range   Specimen Description URINE, CLEAN CATCH    Special Requests NONE    Culture <10,000 COLONIES/mL INSIGNIFICANT GROWTH (A)    Report Status 03/23/2017 FINAL   Glucose, capillary     Status: Abnormal   Collection Time: 03/22/17  2:27 PM  Result Value Ref Range   Glucose-Capillary 147 (H) 65 - 99 mg/dL  Glucose, capillary     Status: Abnormal   Collection Time: 03/22/17  4:58 PM  Result Value Ref Range   Glucose-Capillary 187 (H) 65 - 99 mg/dL  Troponin I (q 6hr x 3)     Status: Abnormal   Collection Time: 03/22/17  6:30 PM  Result Value Ref Range   Troponin I 0.34 (HH) <0.03 ng/mL    Comment: CRITICAL VALUE NOTED.  VALUE IS CONSISTENT WITH PREVIOUSLY REPORTED AND CALLED VALUE.  Culture, blood (routine x 2)     Status: None    Collection Time: 03/22/17  9:14 PM  Result Value Ref Range   Specimen Description BLOOD RIGHT ANTECUBITAL    Special Requests      BOTTLES DRAWN AEROBIC AND ANAEROBIC Blood Culture adequate volume   Culture NO GROWTH 5 DAYS    Report Status 03/27/2017 FINAL   Culture, blood (routine x 2)     Status: None   Collection Time: 03/22/17  9:18 PM  Result Value Ref Range   Specimen Description BLOOD RIGHT HAND    Special Requests IN PEDIATRIC BOTTLE Blood Culture adequate volume    Culture NO GROWTH 5 DAYS    Report Status 03/27/2017 FINAL   Glucose, capillary  Status: Abnormal   Collection Time: 03/22/17 10:10 PM  Result Value Ref Range   Glucose-Capillary 209 (H) 65 - 99 mg/dL  IFE, 24hr Urine (w Total Protein)     Status: Abnormal   Collection Time: 03/23/17  3:45 AM  Result Value Ref Range   Time 24 hours   Total Protein, Urine 91.5 Not Estab. mg/dL   Total Protein, Urine-Ur/day 389 (H) 30 - 150 mg/24 hr   Albumin, U 16.1 %   ALPHA 1 URINE 8.5 %   Alpha 2, Urine 21.1 %   % BETA, Urine 32.1 %   GAMMA GLOBULIN URINE 22.2 %   Free Lt Chn Excr Rate 561.00 (H) 1.35 - 24.19 mg/L    Comment: **Results verified by repeat testing**   Free Lambda Lt Chains,Ur 97.80 (H) 0.24 - 6.66 mg/L   Free Kappa/Lambda Ratio 5.74 2.04 - 10.37    Comment: (NOTE) Performed At: [facility] [name], [place] [id number] Lindon Romp MD [id number]    Immunofixation Result, Urine Comment     Comment: (NOTE) Immunofixation shows IgG monoclonal protein with kappa light chain specificity.    Total Volume 425    M-SPIKE %, Urine 6.5 (H) Not Observed %    Comment: AN ADDITIONAL M SPIKE WAS OBSERVED AT A CONCENTRATION OF 4.0%.    M-Spike, Mg/24 Hr 25 (H) Not Observed mg/24 hr   Note: Comment     Comment: (NOTE) Protein electrophoresis scan will follow via computer, mail, or courier delivery.   CBC with Differential/Platelet     Status: Abnormal   Collection Time:  03/23/17  4:55 AM  Result Value Ref Range   WBC 7.7 4.0 - 10.5 K/uL   RBC 2.98 (L) 4.22 - 5.81 MIL/uL   Hemoglobin 9.1 (L) 13.0 - 17.0 g/dL   HCT 27.8 (L) 39.0 - 52.0 %   MCV 93.3 78.0 - 100.0 fL   MCH 30.5 26.0 - 34.0 pg   MCHC 32.7 30.0 - 36.0 g/dL   RDW 16.5 (H) 11.5 - 15.5 %   Platelets 33 (L) 150 - 400 K/uL    Comment: PLATELET COUNT CONFIRMED BY SMEAR   Neutrophils Relative % 63 %   Neutro Abs 4.8 1.7 - 7.7 K/uL   Lymphocytes Relative 21 %   Lymphs Abs 1.6 0.7 - 4.0 K/uL   Monocytes Relative 10 %   Monocytes Absolute 0.8 0.1 - 1.0 K/uL   Eosinophils Relative 6 %   Eosinophils Absolute 0.5 0.0 - 0.7 K/uL   Basophils Relative 0 %   Basophils Absolute 0.0 0.0 - 0.1 K/uL  Basic metabolic panel     Status: Abnormal   Collection Time: 03/23/17  4:55 AM  Result Value Ref Range   Sodium 134 (L) 135 - 145 mmol/L   Potassium 4.6 3.5 - 5.1 mmol/L   Chloride 96 (L) 101 - 111 mmol/L   CO2 25 22 - 32 mmol/L   Glucose, Bld 173 (H) 65 - 99 mg/dL   BUN 68 (H) 6 - 20 mg/dL   Creatinine, Ser 12.85 (H) 0.61 - 1.24 mg/dL   Calcium 8.7 (L) 8.9 - 10.3 mg/dL   GFR calc non Af Amer 3 (L) >60 mL/min   GFR calc Af Amer 4 (L) >60 mL/min    Comment: (NOTE) The eGFR has been calculated using the CKD EPI equation. This calculation has not been validated in all clinical situations. eGFR's persistently <60 mL/min signify possible Chronic Kidney Disease.  Anion gap 13 5 - 15  Save smear     Status: None   Collection Time: 03/23/17  4:55 AM  Result Value Ref Range   Smear Review SMEAR STAINED AND AVAILABLE FOR REVIEW   Pathologist smear review     Status: None   Collection Time: 03/23/17  4:55 AM  Result Value Ref Range   Path Review Thrombocytopenia and mild anisopoikilocytosis     Comment: Reviewed by Lennox Solders. Lyndon Code, M.D. [phone]   Glucose, capillary     Status: Abnormal   Collection Time: 03/23/17  7:48 AM  Result Value Ref Range   Glucose-Capillary 166 (H) 65 - 99 mg/dL  Glucose,  capillary     Status: Abnormal   Collection Time: 03/23/17 12:32 PM  Result Value Ref Range   Glucose-Capillary 172 (H) 65 - 99 mg/dL  Glucose, capillary     Status: Abnormal   Collection Time: 03/23/17 10:00 PM  Result Value Ref Range   Glucose-Capillary 208 (H) 65 - 99 mg/dL  CBC with Differential/Platelet     Status: Abnormal   Collection Time: 03/24/17  4:46 AM  Result Value Ref Range   WBC 8.7 4.0 - 10.5 K/uL   RBC 3.41 (L) 4.22 - 5.81 MIL/uL   Hemoglobin 10.5 (L) 13.0 - 17.0 g/dL   HCT 31.6 (L) 39.0 - 52.0 %   MCV 92.7 78.0 - 100.0 fL   MCH 30.8 26.0 - 34.0 pg   MCHC 33.2 30.0 - 36.0 g/dL   RDW 16.3 (H) 11.5 - 15.5 %   Platelets 31 (L) 150 - 400 K/uL    Comment: CONSISTENT WITH PREVIOUS RESULT   Neutrophils Relative % 66 %   Neutro Abs 5.7 1.7 - 7.7 K/uL   Lymphocytes Relative 18 %   Lymphs Abs 1.6 0.7 - 4.0 K/uL   Monocytes Relative 10 %   Monocytes Absolute 0.9 0.1 - 1.0 K/uL   Eosinophils Relative 6 %   Eosinophils Absolute 0.5 0.0 - 0.7 K/uL   Basophils Relative 0 %   Basophils Absolute 0.0 0.0 - 0.1 K/uL  Basic metabolic panel     Status: Abnormal   Collection Time: 03/24/17  4:46 AM  Result Value Ref Range   Sodium 133 (L) 135 - 145 mmol/L   Potassium 4.3 3.5 - 5.1 mmol/L   Chloride 95 (L) 101 - 111 mmol/L   CO2 29 22 - 32 mmol/L   Glucose, Bld 106 (H) 65 - 99 mg/dL   BUN 33 (H) 6 - 20 mg/dL   Creatinine, Ser 8.10 (H) 0.61 - 1.24 mg/dL    Comment: DELTA CHECK NOTED DIALYSIS    Calcium 9.1 8.9 - 10.3 mg/dL   GFR calc non Af Amer 6 (L) >60 mL/min   GFR calc Af Amer 7 (L) >60 mL/min    Comment: (NOTE) The eGFR has been calculated using the CKD EPI equation. This calculation has not been validated in all clinical situations. eGFR's persistently <60 mL/min signify possible Chronic Kidney Disease.    Anion gap 9 5 - 15  Hepatic function panel     Status: Abnormal   Collection Time: 03/24/17  4:46 AM  Result Value Ref Range   Total Protein 7.2 6.5 - 8.1  g/dL   Albumin 2.6 (L) 3.5 - 5.0 g/dL   AST 22 15 - 41 U/L   ALT 18 17 - 63 U/L   Alkaline Phosphatase 56 38 - 126 U/L   Total Bilirubin 0.7 0.3 -  1.2 mg/dL   Bilirubin, Direct 0.1 0.1 - 0.5 mg/dL   Indirect Bilirubin 0.6 0.3 - 0.9 mg/dL  Glucose, capillary     Status: Abnormal   Collection Time: 03/24/17  7:40 AM  Result Value Ref Range   Glucose-Capillary 114 (H) 65 - 99 mg/dL  Hepatitis C antibody     Status: None   Collection Time: 03/24/17 10:32 AM  Result Value Ref Range   HCV Ab <0.1 0.0 - 0.9 s/co ratio    Comment: (NOTE)                                  Negative:     < 0.8                             Indeterminate: 0.8 - 0.9                                  Positive:     > 0.9 The CDC recommends that a positive HCV antibody result be followed up with a HCV Nucleic Acid Amplification test (680321). Performed At: [facility] [place], [place] [id number] Lindon Romp MD [id number]   Glucose, capillary     Status: Abnormal   Collection Time: 03/24/17 11:55 AM  Result Value Ref Range   Glucose-Capillary 173 (H) 65 - 99 mg/dL  ECHOCARDIOGRAM COMPLETE     Status: None   Collection Time: 03/24/17  5:16 PM  Result Value Ref Range   Weight 4,373.93 oz   Height 71 in   BP 170/56 mmHg  Glucose, capillary     Status: Abnormal   Collection Time: 03/24/17  5:28 PM  Result Value Ref Range   Glucose-Capillary 102 (H) 65 - 99 mg/dL  Glucose, capillary     Status: Abnormal   Collection Time: 03/24/17  9:20 PM  Result Value Ref Range   Glucose-Capillary 132 (H) 65 - 99 mg/dL  Hepatitis B surface antigen     Status: None   Collection Time: 03/25/17  7:36 AM  Result Value Ref Range   Hepatitis B Surface Ag Negative Negative    Comment: (NOTE) FAXED RESULTS TO [facility] AT 3:21PM BY BRIANA M. Performed At: [facility] [place], [place] [id number] Lindon Romp MD LK:[id number]   Renal function panel     Status: Abnormal    Collection Time: 03/25/17  7:36 AM  Result Value Ref Range   Sodium 132 (L) 135 - 145 mmol/L   Potassium 4.2 3.5 - 5.1 mmol/L   Chloride 96 (L) 101 - 111 mmol/L   CO2 26 22 - 32 mmol/L   Glucose, Bld 115 (H) 65 - 99 mg/dL   BUN 43 (H) 6 - 20 mg/dL   Creatinine, Ser 10.42 (H) 0.61 - 1.24 mg/dL   Calcium 9.3 8.9 - 10.3 mg/dL   Phosphorus 5.1 (H) 2.5 - 4.6 mg/dL   Albumin 2.7 (L) 3.5 - 5.0 g/dL   GFR calc non Af Amer 4 (L) >60 mL/min   GFR calc Af Amer 5 (L) >60 mL/min    Comment: (NOTE) The eGFR has been calculated using the CKD EPI equation. This calculation has not been validated in all clinical situations. eGFR's persistently <60 mL/min signify possible Chronic Kidney Disease.  Anion gap 10 5 - 15  CBC     Status: Abnormal   Collection Time: 03/25/17  7:36 AM  Result Value Ref Range   WBC 8.8 4.0 - 10.5 K/uL   RBC 3.07 (L) 4.22 - 5.81 MIL/uL   Hemoglobin 9.1 (L) 13.0 - 17.0 g/dL   HCT 28.5 (L) 39.0 - 52.0 %   MCV 92.8 78.0 - 100.0 fL   MCH 29.6 26.0 - 34.0 pg   MCHC 31.9 30.0 - 36.0 g/dL   RDW 16.6 (H) 11.5 - 15.5 %   Platelets 66 (L) 150 - 400 K/uL    Comment: CONSISTENT WITH PREVIOUS RESULT  Glucose, capillary     Status: Abnormal   Collection Time: 03/25/17 12:32 PM  Result Value Ref Range   Glucose-Capillary 137 (H) 65 - 99 mg/dL  Glucose, capillary     Status: Abnormal   Collection Time: 03/25/17  4:52 PM  Result Value Ref Range   Glucose-Capillary 137 (H) 65 - 99 mg/dL  Glucose, capillary     Status: Abnormal   Collection Time: 03/25/17  9:51 PM  Result Value Ref Range   Glucose-Capillary 175 (H) 65 - 99 mg/dL  Renal function panel     Status: Abnormal   Collection Time: 03/26/17  5:26 AM  Result Value Ref Range   Sodium 135 135 - 145 mmol/L   Potassium 3.9 3.5 - 5.1 mmol/L   Chloride 97 (L) 101 - 111 mmol/L   CO2 30 22 - 32 mmol/L   Glucose, Bld 168 (H) 65 - 99 mg/dL   BUN 29 (H) 6 - 20 mg/dL   Creatinine, Ser 7.85 (H) 0.61 - 1.24 mg/dL   Calcium  9.4 8.9 - 10.3 mg/dL   Phosphorus 4.5 2.5 - 4.6 mg/dL   Albumin 2.6 (L) 3.5 - 5.0 g/dL   GFR calc non Af Amer 6 (L) >60 mL/min   GFR calc Af Amer 7 (L) >60 mL/min    Comment: (NOTE) The eGFR has been calculated using the CKD EPI equation. This calculation has not been validated in all clinical situations. eGFR's persistently <60 mL/min signify possible Chronic Kidney Disease.    Anion gap 8 5 - 15  CBC     Status: Abnormal   Collection Time: 03/26/17  5:26 AM  Result Value Ref Range   WBC 7.7 4.0 - 10.5 K/uL   RBC 3.12 (L) 4.22 - 5.81 MIL/uL   Hemoglobin 9.4 (L) 13.0 - 17.0 g/dL   HCT 29.6 (L) 39.0 - 52.0 %   MCV 94.9 78.0 - 100.0 fL   MCH 30.1 26.0 - 34.0 pg   MCHC 31.8 30.0 - 36.0 g/dL   RDW 16.8 (H) 11.5 - 15.5 %   Platelets 94 (L) 150 - 400 K/uL    Comment: CONSISTENT WITH PREVIOUS RESULT  Glucose, capillary     Status: Abnormal   Collection Time: 03/26/17  7:43 AM  Result Value Ref Range   Glucose-Capillary 164 (H) 65 - 99 mg/dL  Glucose, capillary     Status: Abnormal   Collection Time: 03/26/17 12:04 PM  Result Value Ref Range   Glucose-Capillary 146 (H) 65 - 99 mg/dL   Comment 1 Notify RN   Glucose, capillary     Status: Abnormal   Collection Time: 03/26/17  5:01 PM  Result Value Ref Range   Glucose-Capillary 169 (H) 65 - 99 mg/dL  Glucose, capillary     Status: Abnormal   Collection Time: 03/26/17  9:54  PM  Result Value Ref Range   Glucose-Capillary 148 (H) 65 - 99 mg/dL  Renal function panel     Status: Abnormal   Collection Time: 03/27/17  5:02 AM  Result Value Ref Range   Sodium 134 (L) 135 - 145 mmol/L   Potassium 3.8 3.5 - 5.1 mmol/L   Chloride 95 (L) 101 - 111 mmol/L   CO2 28 22 - 32 mmol/L   Glucose, Bld 142 (H) 65 - 99 mg/dL   BUN 41 (H) 6 - 20 mg/dL   Creatinine, Ser 9.80 (H) 0.61 - 1.24 mg/dL   Calcium 9.7 8.9 - 10.3 mg/dL   Phosphorus 5.5 (H) 2.5 - 4.6 mg/dL   Albumin 2.8 (L) 3.5 - 5.0 g/dL   GFR calc non Af Amer 5 (L) >60 mL/min   GFR calc  Af Amer 6 (L) >60 mL/min    Comment: (NOTE) The eGFR has been calculated using the CKD EPI equation. This calculation has not been validated in all clinical situations. eGFR's persistently <60 mL/min signify possible Chronic Kidney Disease.    Anion gap 11 5 - 15  CBC     Status: Abnormal   Collection Time: 03/27/17  5:02 AM  Result Value Ref Range   WBC 7.6 4.0 - 10.5 K/uL   RBC 3.05 (L) 4.22 - 5.81 MIL/uL   Hemoglobin 9.1 (L) 13.0 - 17.0 g/dL   HCT 28.8 (L) 39.0 - 52.0 %   MCV 94.4 78.0 - 100.0 fL   MCH 29.8 26.0 - 34.0 pg   MCHC 31.6 30.0 - 36.0 g/dL   RDW 16.9 (H) 11.5 - 15.5 %   Platelets 137 (L) 150 - 400 K/uL  Glucose, capillary     Status: Abnormal   Collection Time: 03/27/17  8:15 AM  Result Value Ref Range   Glucose-Capillary 127 (H) 65 - 99 mg/dL  Glucose, capillary     Status: Abnormal   Collection Time: 03/27/17 12:23 PM  Result Value Ref Range   Glucose-Capillary 109 (H) 65 - 99 mg/dL  Glucose, capillary     Status: Abnormal   Collection Time: 03/27/17  5:38 PM  Result Value Ref Range   Glucose-Capillary 171 (H) 65 - 99 mg/dL  Glucose, capillary     Status: Abnormal   Collection Time: 03/27/17 11:01 PM  Result Value Ref Range   Glucose-Capillary 129 (H) 65 - 99 mg/dL  Renal function panel     Status: Abnormal   Collection Time: 03/28/17  4:46 AM  Result Value Ref Range   Sodium 132 (L) 135 - 145 mmol/L   Potassium 4.2 3.5 - 5.1 mmol/L   Chloride 95 (L) 101 - 111 mmol/L   CO2 25 22 - 32 mmol/L   Glucose, Bld 124 (H) 65 - 99 mg/dL   BUN 54 (H) 6 - 20 mg/dL   Creatinine, Ser 11.51 (H) 0.61 - 1.24 mg/dL   Calcium 9.4 8.9 - 10.3 mg/dL   Phosphorus 5.7 (H) 2.5 - 4.6 mg/dL   Albumin 3.0 (L) 3.5 - 5.0 g/dL   GFR calc non Af Amer 4 (L) >60 mL/min   GFR calc Af Amer 5 (L) >60 mL/min    Comment: (NOTE) The eGFR has been calculated using the CKD EPI equation. This calculation has not been validated in all clinical situations. eGFR's persistently <60 mL/min  signify possible Chronic Kidney Disease.    Anion gap 12 5 - 15  CBC     Status: Abnormal   Collection  Time: 03/28/17  4:46 AM  Result Value Ref Range   WBC 8.3 4.0 - 10.5 K/uL   RBC 3.07 (L) 4.22 - 5.81 MIL/uL   Hemoglobin 9.5 (L) 13.0 - 17.0 g/dL   HCT 29.2 (L) 39.0 - 52.0 %   MCV 95.1 78.0 - 100.0 fL   MCH 30.9 26.0 - 34.0 pg   MCHC 32.5 30.0 - 36.0 g/dL   RDW 17.5 (H) 11.5 - 15.5 %   Platelets 177 150 - 400 K/uL  Glucose, capillary     Status: Abnormal   Collection Time: 03/28/17  7:55 AM  Result Value Ref Range   Glucose-Capillary 123 (H) 65 - 99 mg/dL  Vancomycin, random     Status: None   Collection Time: 03/28/17  9:00 AM  Result Value Ref Range   Vancomycin Rm 14     Comment:        Random Vancomycin therapeutic range is dependent on dosage and time of specimen collection. A peak range is 20.0-40.0 ug/mL A trough range is 5.0-15.0 ug/mL          Glucose, capillary     Status: Abnormal   Collection Time: 03/28/17  5:29 PM  Result Value Ref Range   Glucose-Capillary 145 (H) 65 - 99 mg/dL  Glucose, capillary     Status: None   Collection Time: 04/05/17  3:08 PM  Result Value Ref Range   Glucose-Capillary 97 65 - 99 mg/dL  I-STAT 4, (NA,K, GLUC, HGB,HCT)     Status: Abnormal   Collection Time: 04/07/17  8:01 AM  Result Value Ref Range   Sodium 139 135 - 145 mmol/L   Potassium 4.1 3.5 - 5.1 mmol/L   Glucose, Bld 167 (H) 65 - 99 mg/dL   HCT 34.0 (L) 39.0 - 52.0 %   Hemoglobin 11.6 (L) 13.0 - 17.0 g/dL  Glucose, capillary     Status: Abnormal   Collection Time: 04/07/17 12:17 PM  Result Value Ref Range   Glucose-Capillary 143 (H) 65 - 99 mg/dL  Basic metabolic panel     Status: Abnormal   Collection Time: 04/07/17 10:28 PM  Result Value Ref Range   Sodium 136 135 - 145 mmol/L   Potassium 4.3 3.5 - 5.1 mmol/L   Chloride 96 (L) 101 - 111 mmol/L   CO2 27 22 - 32 mmol/L   Glucose, Bld 187 (H) 65 - 99 mg/dL   BUN 44 (H) 6 - 20 mg/dL   Creatinine, Ser 9.62 (H)  0.61 - 1.24 mg/dL   Calcium 8.9 8.9 - 10.3 mg/dL   GFR calc non Af Amer 5 (L) >60 mL/min   GFR calc Af Amer 6 (L) >60 mL/min    Comment: (NOTE) The eGFR has been calculated using the CKD EPI equation. This calculation has not been validated in all clinical situations. eGFR's persistently <60 mL/min signify possible Chronic Kidney Disease.    Anion gap 13 5 - 15  CBC     Status: Abnormal   Collection Time: 04/07/17 10:28 PM  Result Value Ref Range   WBC 12.8 (H) 4.0 - 10.5 K/uL   RBC 3.47 (L) 4.22 - 5.81 MIL/uL   Hemoglobin 10.4 (L) 13.0 - 17.0 g/dL   HCT 33.0 (L) 39.0 - 52.0 %   MCV 95.1 78.0 - 100.0 fL   MCH 30.0 26.0 - 34.0 pg   MCHC 31.5 30.0 - 36.0 g/dL   RDW 17.4 (H) 11.5 - 15.5 %   Platelets 195 150 -  400 K/uL  Urinalysis, Routine w reflex microscopic- may I&O cath if menses     Status: Abnormal   Collection Time: 04/08/17  1:10 AM  Result Value Ref Range   Color, Urine YELLOW YELLOW   APPearance CLEAR CLEAR   Specific Gravity, Urine 1.009 1.005 - 1.030   pH 7.0 5.0 - 8.0   Glucose, UA 150 (A) NEGATIVE mg/dL   Hgb urine dipstick MODERATE (A) NEGATIVE   Bilirubin Urine NEGATIVE NEGATIVE   Ketones, ur NEGATIVE NEGATIVE mg/dL   Protein, ur 100 (A) NEGATIVE mg/dL   Nitrite NEGATIVE NEGATIVE   Leukocytes, UA NEGATIVE NEGATIVE   RBC / HPF TOO NUMEROUS TO COUNT 0 - 5 RBC/hpf   WBC, UA 0-5 0 - 5 WBC/hpf   Bacteria, UA NONE SEEN NONE SEEN   Squamous Epithelial / LPF NONE SEEN NONE SEEN  Urine culture     Status: Abnormal   Collection Time: 04/08/17  1:10 AM  Result Value Ref Range   Specimen Description URINE, CATHETERIZED    Special Requests NONE    Culture <10,000 COLONIES/mL INSIGNIFICANT GROWTH (A)    Report Status 04/09/2017 FINAL   Comprehensive metabolic panel     Status: Abnormal   Collection Time: 04/12/17  6:52 PM  Result Value Ref Range   Sodium 132 (L) 135 - 145 mmol/L   Potassium 4.4 3.5 - 5.1 mmol/L   Chloride 96 (L) 101 - 111 mmol/L   CO2 23 22 - 32  mmol/L   Glucose, Bld 113 (H) 65 - 99 mg/dL   BUN 44 (H) 6 - 20 mg/dL   Creatinine, Ser 11.26 (H) 0.61 - 1.24 mg/dL   Calcium 8.5 (L) 8.9 - 10.3 mg/dL   Total Protein 7.7 6.5 - 8.1 g/dL   Albumin 2.9 (L) 3.5 - 5.0 g/dL   AST 17 15 - 41 U/L   ALT 15 (L) 17 - 63 U/L   Alkaline Phosphatase 60 38 - 126 U/L   Total Bilirubin 0.7 0.3 - 1.2 mg/dL   GFR calc non Af Amer 4 (L) >60 mL/min   GFR calc Af Amer 5 (L) >60 mL/min    Comment: (NOTE) The eGFR has been calculated using the CKD EPI equation. This calculation has not been validated in all clinical situations. eGFR's persistently <60 mL/min signify possible Chronic Kidney Disease.    Anion gap 13 5 - 15  CBC with Differential     Status: Abnormal   Collection Time: 04/12/17  6:52 PM  Result Value Ref Range   WBC 6.7 4.0 - 10.5 K/uL   RBC 3.15 (L) 4.22 - 5.81 MIL/uL   Hemoglobin 9.3 (L) 13.0 - 17.0 g/dL   HCT 29.4 (L) 39.0 - 52.0 %   MCV 93.3 78.0 - 100.0 fL   MCH 29.5 26.0 - 34.0 pg   MCHC 31.6 30.0 - 36.0 g/dL   RDW 16.7 (H) 11.5 - 15.5 %   Platelets 165 150 - 400 K/uL   Neutrophils Relative % 77 %   Neutro Abs 5.2 1.7 - 7.7 K/uL   Lymphocytes Relative 13 %   Lymphs Abs 0.9 0.7 - 4.0 K/uL   Monocytes Relative 7 %   Monocytes Absolute 0.5 0.1 - 1.0 K/uL   Eosinophils Relative 3 %   Eosinophils Absolute 0.2 0.0 - 0.7 K/uL   Basophils Relative 0 %   Basophils Absolute 0.0 0.0 - 0.1 K/uL  Protime-INR     Status: None   Collection Time: 04/12/17  6:52 PM  Result Value Ref Range   Prothrombin Time 14.8 11.4 - 15.2 seconds   INR 1.15   CBG monitoring, ED     Status: Abnormal   Collection Time: 04/12/17  6:54 PM  Result Value Ref Range   Glucose-Capillary 107 (H) 65 - 99 mg/dL  Urinalysis, Routine w reflex microscopic     Status: Abnormal   Collection Time: 04/12/17  7:11 PM  Result Value Ref Range   Color, Urine YELLOW YELLOW   APPearance TURBID (A) CLEAR   Specific Gravity, Urine 1.009 1.005 - 1.030   pH 8.0 5.0 - 8.0    Glucose, UA NEGATIVE NEGATIVE mg/dL   Hgb urine dipstick MODERATE (A) NEGATIVE   Bilirubin Urine NEGATIVE NEGATIVE   Ketones, ur NEGATIVE NEGATIVE mg/dL   Protein, ur 100 (A) NEGATIVE mg/dL   Nitrite NEGATIVE NEGATIVE   Leukocytes, UA LARGE (A) NEGATIVE   RBC / HPF 6-30 0 - 5 RBC/hpf   WBC, UA TOO NUMEROUS TO COUNT 0 - 5 WBC/hpf   Bacteria, UA MANY (A) NONE SEEN   Squamous Epithelial / LPF NONE SEEN NONE SEEN  Urine rapid drug screen (hosp performed)     Status: None   Collection Time: 04/12/17  7:11 PM  Result Value Ref Range   Opiates NONE DETECTED NONE DETECTED   Cocaine NONE DETECTED NONE DETECTED   Benzodiazepines NONE DETECTED NONE DETECTED   Amphetamines NONE DETECTED NONE DETECTED   Tetrahydrocannabinol NONE DETECTED NONE DETECTED   Barbiturates NONE DETECTED NONE DETECTED    Comment:        DRUG SCREEN FOR MEDICAL PURPOSES ONLY.  IF CONFIRMATION IS NEEDED FOR ANY PURPOSE, NOTIFY LAB WITHIN 5 DAYS.        LOWEST DETECTABLE LIMITS FOR URINE DRUG SCREEN Drug Class       Cutoff (ng/mL) Amphetamine      1000 Barbiturate      200 Benzodiazepine   659 Tricyclics       935 Opiates          300 Cocaine          300 THC              50   I-Stat CG4 Lactic Acid, ED     Status: None   Collection Time: 04/12/17  7:22 PM  Result Value Ref Range   Lactic Acid, Venous 1.25 0.5 - 1.9 mmol/L  Culture, blood (Routine x 2)     Status: None   Collection Time: 04/12/17  8:47 PM  Result Value Ref Range   Specimen Description BLOOD LEFT ARM    Special Requests      BOTTLES DRAWN AEROBIC AND ANAEROBIC Blood Culture adequate volume   Culture NO GROWTH 5 DAYS    Report Status 04/17/2017 FINAL   Culture, blood (Routine x 2)     Status: None   Collection Time: 04/12/17  8:55 PM  Result Value Ref Range   Specimen Description BLOOD LEFT HAND    Special Requests IN PEDIATRIC BOTTLE Blood Culture adequate volume    Culture NO GROWTH 5 DAYS    Report Status 04/17/2017 FINAL   I-Stat  CG4 Lactic Acid, ED     Status: None   Collection Time: 04/13/17  1:10 AM  Result Value Ref Range   Lactic Acid, Venous 0.55 0.5 - 1.9 mmol/L  Vancomycin, random     Status: None   Collection Time: 04/13/17  5:07 AM  Result Value Ref Range  Vancomycin Rm 19     Comment:        Random Vancomycin therapeutic range is dependent on dosage and time of specimen collection. A peak range is 20.0-40.0 ug/mL A trough range is 5.0-15.0 ug/mL          CBC     Status: Abnormal   Collection Time: 04/13/17  5:07 AM  Result Value Ref Range   WBC 6.7 4.0 - 10.5 K/uL   RBC 3.00 (L) 4.22 - 5.81 MIL/uL   Hemoglobin 8.8 (L) 13.0 - 17.0 g/dL   HCT 27.9 (L) 39.0 - 52.0 %   MCV 93.0 78.0 - 100.0 fL   MCH 29.3 26.0 - 34.0 pg   MCHC 31.5 30.0 - 36.0 g/dL   RDW 16.8 (H) 11.5 - 15.5 %   Platelets 168 150 - 400 K/uL  Creatinine, serum     Status: Abnormal   Collection Time: 04/13/17  5:07 AM  Result Value Ref Range   Creatinine, Ser 12.14 (H) 0.61 - 1.24 mg/dL   GFR calc non Af Amer 4 (L) >60 mL/min   GFR calc Af Amer 4 (L) >60 mL/min    Comment: (NOTE) The eGFR has been calculated using the CKD EPI equation. This calculation has not been validated in all clinical situations. eGFR's persistently <60 mL/min signify possible Chronic Kidney Disease.   Ammonia     Status: None   Collection Time: 04/13/17  5:07 AM  Result Value Ref Range   Ammonia 18 9 - 35 umol/L  Magnesium     Status: None   Collection Time: 04/13/17  5:07 AM  Result Value Ref Range   Magnesium 2.0 1.7 - 2.4 mg/dL  Phosphorus     Status: Abnormal   Collection Time: 04/13/17  5:07 AM  Result Value Ref Range   Phosphorus 5.5 (H) 2.5 - 4.6 mg/dL  Renal function panel     Status: Abnormal   Collection Time: 04/13/17  5:07 AM  Result Value Ref Range   Sodium 133 (L) 135 - 145 mmol/L   Potassium 4.1 3.5 - 5.1 mmol/L   Chloride 95 (L) 101 - 111 mmol/L   CO2 23 22 - 32 mmol/L   Glucose, Bld 104 (H) 65 - 99 mg/dL   BUN 49 (H) 6 -  20 mg/dL   Creatinine, Ser 12.16 (H) 0.61 - 1.24 mg/dL   Calcium 8.6 (L) 8.9 - 10.3 mg/dL   Phosphorus 5.5 (H) 2.5 - 4.6 mg/dL   Albumin 2.8 (L) 3.5 - 5.0 g/dL   GFR calc non Af Amer 4 (L) >60 mL/min   GFR calc Af Amer 4 (L) >60 mL/min    Comment: (NOTE) The eGFR has been calculated using the CKD EPI equation. This calculation has not been validated in all clinical situations. eGFR's persistently <60 mL/min signify possible Chronic Kidney Disease.    Anion gap 15 5 - 15  Vitamin B12     Status: None   Collection Time: 04/13/17  5:07 AM  Result Value Ref Range   Vitamin B-12 466 180 - 914 pg/mL    Comment: (NOTE) This assay is not validated for testing neonatal or myeloproliferative syndrome specimens for Vitamin B12 levels.   Folate     Status: None   Collection Time: 04/13/17  5:07 AM  Result Value Ref Range   Folate 9.9 >5.9 ng/mL  Iron and TIBC     Status: Abnormal   Collection Time: 04/13/17  5:07 AM  Result Value  Ref Range   Iron 18 (L) 45 - 182 ug/dL   TIBC 189 (L) 250 - 450 ug/dL   Saturation Ratios 10 (L) 17.9 - 39.5 %   UIBC 171 ug/dL  Ferritin     Status: Abnormal   Collection Time: 04/13/17  5:07 AM  Result Value Ref Range   Ferritin 1,002 (H) 24 - 336 ng/mL  Reticulocytes     Status: Abnormal   Collection Time: 04/13/17  5:07 AM  Result Value Ref Range   Retic Ct Pct 1.8 0.4 - 3.1 %   RBC. 3.00 (L) 4.22 - 5.81 MIL/uL   Retic Count, Absolute 54.0 19.0 - 186.0 K/uL  Glucose, capillary     Status: None   Collection Time: 04/13/17  8:02 AM  Result Value Ref Range   Glucose-Capillary 86 65 - 99 mg/dL  MRSA PCR Screening     Status: None   Collection Time: 04/13/17  8:59 AM  Result Value Ref Range   MRSA by PCR NEGATIVE NEGATIVE    Comment:        The GeneXpert MRSA Assay (FDA approved for NASAL specimens only), is one component of a comprehensive MRSA colonization surveillance program. It is not intended to diagnose MRSA infection nor to guide  or monitor treatment for MRSA infections.   Culture, Urine     Status: Abnormal   Collection Time: 04/13/17  9:00 AM  Result Value Ref Range   Specimen Description URINE, RANDOM    Special Requests NONE    Culture MULTIPLE SPECIES PRESENT, SUGGEST RECOLLECTION (A)    Report Status 04/14/2017 FINAL   Glucose, capillary     Status: Abnormal   Collection Time: 04/13/17  5:21 PM  Result Value Ref Range   Glucose-Capillary 103 (H) 65 - 99 mg/dL  Glucose, capillary     Status: Abnormal   Collection Time: 04/13/17 10:18 PM  Result Value Ref Range   Glucose-Capillary 141 (H) 65 - 99 mg/dL  Glucose, capillary     Status: Abnormal   Collection Time: 04/14/17  7:53 AM  Result Value Ref Range   Glucose-Capillary 122 (H) 65 - 99 mg/dL  CBC     Status: Abnormal   Collection Time: 04/14/17  8:42 AM  Result Value Ref Range   WBC 10.2 4.0 - 10.5 K/uL   RBC 3.05 (L) 4.22 - 5.81 MIL/uL   Hemoglobin 9.2 (L) 13.0 - 17.0 g/dL   HCT 28.4 (L) 39.0 - 52.0 %   MCV 93.1 78.0 - 100.0 fL   MCH 30.2 26.0 - 34.0 pg   MCHC 32.4 30.0 - 36.0 g/dL   RDW 17.1 (H) 11.5 - 15.5 %   Platelets 185 150 - 400 K/uL  Basic metabolic panel     Status: Abnormal   Collection Time: 04/14/17  8:42 AM  Result Value Ref Range   Sodium 133 (L) 135 - 145 mmol/L   Potassium 3.9 3.5 - 5.1 mmol/L   Chloride 94 (L) 101 - 111 mmol/L   CO2 28 22 - 32 mmol/L   Glucose, Bld 132 (H) 65 - 99 mg/dL   BUN 28 (H) 6 - 20 mg/dL   Creatinine, Ser 9.23 (H) 0.61 - 1.24 mg/dL   Calcium 8.6 (L) 8.9 - 10.3 mg/dL   GFR calc non Af Amer 5 (L) >60 mL/min   GFR calc Af Amer 6 (L) >60 mL/min    Comment: (NOTE) The eGFR has been calculated using the CKD EPI equation. This calculation has  not been validated in all clinical situations. eGFR's persistently <60 mL/min signify possible Chronic Kidney Disease.    Anion gap 11 5 - 15  Glucose, capillary     Status: Abnormal   Collection Time: 04/14/17 12:15 PM  Result Value Ref Range    Glucose-Capillary 157 (H) 65 - 99 mg/dL  Glucose, capillary     Status: Abnormal   Collection Time: 04/14/17  5:18 PM  Result Value Ref Range   Glucose-Capillary 171 (H) 65 - 99 mg/dL  Glucose, capillary     Status: Abnormal   Collection Time: 04/14/17  8:55 PM  Result Value Ref Range   Glucose-Capillary 144 (H) 65 - 99 mg/dL  Glucose, capillary     Status: Abnormal   Collection Time: 04/15/17  7:39 AM  Result Value Ref Range   Glucose-Capillary 128 (H) 65 - 99 mg/dL  Renal function panel     Status: Abnormal   Collection Time: 04/15/17 10:04 AM  Result Value Ref Range   Sodium 130 (L) 135 - 145 mmol/L   Potassium 3.7 3.5 - 5.1 mmol/L   Chloride 93 (L) 101 - 111 mmol/L   CO2 24 22 - 32 mmol/L   Glucose, Bld 153 (H) 65 - 99 mg/dL   BUN 42 (H) 6 - 20 mg/dL   Creatinine, Ser 11.59 (H) 0.61 - 1.24 mg/dL   Calcium 8.8 (L) 8.9 - 10.3 mg/dL   Phosphorus 5.0 (H) 2.5 - 4.6 mg/dL   Albumin 2.7 (L) 3.5 - 5.0 g/dL   GFR calc non Af Amer 4 (L) >60 mL/min   GFR calc Af Amer 5 (L) >60 mL/min    Comment: (NOTE) The eGFR has been calculated using the CKD EPI equation. This calculation has not been validated in all clinical situations. eGFR's persistently <60 mL/min signify possible Chronic Kidney Disease.    Anion gap 13 5 - 15  CBC     Status: Abnormal   Collection Time: 04/15/17 10:04 AM  Result Value Ref Range   WBC 9.6 4.0 - 10.5 K/uL   RBC 3.04 (L) 4.22 - 5.81 MIL/uL   Hemoglobin 9.0 (L) 13.0 - 17.0 g/dL   HCT 27.8 (L) 39.0 - 52.0 %   MCV 91.4 78.0 - 100.0 fL   MCH 29.6 26.0 - 34.0 pg   MCHC 32.4 30.0 - 36.0 g/dL   RDW 16.3 (H) 11.5 - 15.5 %   Platelets 203 150 - 400 K/uL  Glucose, capillary     Status: Abnormal   Collection Time: 04/15/17  2:01 PM  Result Value Ref Range   Glucose-Capillary 105 (H) 65 - 99 mg/dL  Glucose, capillary     Status: Abnormal   Collection Time: 04/15/17  4:28 PM  Result Value Ref Range   Glucose-Capillary 147 (H) 65 - 99 mg/dL  CBC with  Differential     Status: Abnormal   Collection Time: 04/16/17  9:09 PM  Result Value Ref Range   WBC 9.8 4.0 - 10.5 K/uL   RBC 3.09 (L) 4.22 - 5.81 MIL/uL   Hemoglobin 9.3 (L) 13.0 - 17.0 g/dL   HCT 28.5 (L) 39.0 - 52.0 %   MCV 92.2 78.0 - 100.0 fL   MCH 30.1 26.0 - 34.0 pg   MCHC 32.6 30.0 - 36.0 g/dL   RDW 16.9 (H) 11.5 - 15.5 %   Platelets 243 150 - 400 K/uL   Neutrophils Relative % 63 %   Neutro Abs 6.1 1.7 - 7.7 K/uL  Lymphocytes Relative 23 %   Lymphs Abs 2.3 0.7 - 4.0 K/uL   Monocytes Relative 9 %   Monocytes Absolute 0.9 0.1 - 1.0 K/uL   Eosinophils Relative 5 %   Eosinophils Absolute 0.5 0.0 - 0.7 K/uL   Basophils Relative 0 %   Basophils Absolute 0.0 0.0 - 0.1 K/uL  Comprehensive metabolic panel     Status: Abnormal   Collection Time: 04/16/17 10:00 PM  Result Value Ref Range   Sodium 132 (L) 135 - 145 mmol/L   Potassium 4.0 3.5 - 5.1 mmol/L   Chloride 92 (L) 101 - 111 mmol/L   CO2 28 22 - 32 mmol/L   Glucose, Bld 150 (H) 65 - 99 mg/dL   BUN 34 (H) 6 - 20 mg/dL   Creatinine, Ser 10.02 (H) 0.61 - 1.24 mg/dL   Calcium 9.6 8.9 - 10.3 mg/dL   Total Protein 8.4 (H) 6.5 - 8.1 g/dL   Albumin 3.3 (L) 3.5 - 5.0 g/dL   AST 23 15 - 41 U/L   ALT 22 17 - 63 U/L   Alkaline Phosphatase 62 38 - 126 U/L   Total Bilirubin 0.3 0.3 - 1.2 mg/dL   GFR calc non Af Amer 5 (L) >60 mL/min   GFR calc Af Amer 5 (L) >60 mL/min    Comment: (NOTE) The eGFR has been calculated using the CKD EPI equation. This calculation has not been validated in all clinical situations. eGFR's persistently <60 mL/min signify possible Chronic Kidney Disease.    Anion gap 12 5 - 15  ABO/Rh     Status: None   Collection Time: 04/16/17 10:05 PM  Result Value Ref Range   ABO/RH(D) A POS   Type and screen     Status: None   Collection Time: 04/16/17 10:10 PM  Result Value Ref Range   ABO/RH(D) A POS    Antibody Screen NEG    Sample Expiration 04/19/2017   I-STAT, chem 8     Status: Abnormal    Collection Time: 05/19/17  6:22 AM  Result Value Ref Range   Sodium 139 135 - 145 mmol/L   Potassium 3.8 3.5 - 5.1 mmol/L   Chloride 99 (L) 101 - 111 mmol/L   BUN 30 (H) 6 - 20 mg/dL   Creatinine, Ser 7.60 (H) 0.61 - 1.24 mg/dL   Glucose, Bld 127 (H) 65 - 99 mg/dL   Calcium, Ion 1.17 1.15 - 1.40 mmol/L   TCO2 34 0 - 100 mmol/L   Hemoglobin 11.2 (L) 13.0 - 17.0 g/dL   HCT 33.0 (L) 39.0 - 52.0 %  Glucose, capillary     Status: Abnormal   Collection Time: 05/19/17  8:39 AM  Result Value Ref Range   Glucose-Capillary 150 (H) 65 - 99 mg/dL    Objective: General: Patient is awake, alert, and oriented x 3 and in no acute distress.  Integument: Skin is warm, extremely dry and non-supple bilateral with trophic chronic edema changes and plaques of hyperpigmented and keratotic skin. Nails are tender, long, thickened and dystrophic with subungual debris, consistent with onychomycosis, 1-4 bilateral. No signs of infection. No open lesions or preulcerative lesions present bilateral, diffuse callus sub met 1 with no signs of infection. Compression wraps present to legs above the ankle; patient being treated by wound care center. Remaining integument unremarkable.  Vasculature:  Dorsalis Pedis pulse 1/4 bilateral. Posterior Tibial pulse  0/4 bilateral. Capillary fill time <3 sec 1-4 bilateral. No hair growth to the level  of the digits.Temperature gradient within normal limits. No varicosities present bilateral. Lymphedema present bilateral.   Neurology: The patient has diminished sensation measured with a 5.07/10g Semmes Weinstein Monofilament at all pedal sites bilateral . Vibratory sensation diminished bilateral with tuning fork. No Babinski sign present bilateral.   Musculoskeletal: 5th toe amputation status noted bilateral. Muscular strength 5/5 in all lower extremity muscular groups bilateral without pain on range of motion. No tenderness with calf compression bilateral.  Assessment and  Plan: Problem List Items Addressed This Visit      Musculoskeletal and Integument   ONYCHOMYCOSIS - Primary     Other   Lymphedema of lower extremity    Other Visit Diagnoses    Type 2 diabetes mellitus with chronic kidney disease on chronic dialysis, without long-term current use of insulin (HCC)       Foot pain, bilateral         -Examined patient. -Discussed and educated patient on diabetic foot care, especially with  regards to the vascular, neurological and musculoskeletal systems.  -Stressed the importance of good glycemic control and the detriment of not  controlling glucose levels in relation to the foot. -Mechanically debrided all nails 1-4 bilateral using sterile nail nipper and filed with dremel without incident  -Continue with Bag Balm or Okeeffee to feet daily  -Continue with Diabetic Shoes   -Answered all patient questions -Patient to return in 3 months for at risk foot care. Patient to continue with follow up at wound care center for lymphedema/ulcer management -Patient advised to call the office if any problems or questions arise in the  Meantime.  Landis Martins, DPM

## 2017-06-16 ENCOUNTER — Ambulatory Visit (INDEPENDENT_AMBULATORY_CARE_PROVIDER_SITE_OTHER): Payer: Medicare Other | Admitting: Gastroenterology

## 2017-06-16 ENCOUNTER — Encounter: Payer: Self-pay | Admitting: Gastroenterology

## 2017-06-16 VITALS — BP 144/70 | HR 76 | Ht 68.11 in | Wt 263.4 lb

## 2017-06-16 DIAGNOSIS — N186 End stage renal disease: Secondary | ICD-10-CM | POA: Diagnosis not present

## 2017-06-16 DIAGNOSIS — I5032 Chronic diastolic (congestive) heart failure: Secondary | ICD-10-CM

## 2017-06-16 DIAGNOSIS — R140 Abdominal distension (gaseous): Secondary | ICD-10-CM

## 2017-06-16 DIAGNOSIS — R197 Diarrhea, unspecified: Secondary | ICD-10-CM

## 2017-06-16 MED ORDER — PEG-KCL-NACL-NASULF-NA ASC-C 100 G PO SOLR: 1.0000 | Freq: Once | ORAL | 0 refills | Status: AC

## 2017-06-16 NOTE — Progress Notes (Signed)
[facility] Consult Note:  History: [name] 06/16/2017  Referring physician: Rogue Bussing, [name]  Reason for consult/chief complaint: Diarrhea (everything I eat goes right through me, stomach startes gurgling after eating then have to go to the restroom) and Gas   Subjective  HPI:  This is a 66 year old man referred by primary care noted above for abdominal bloating, gas and diarrhea. He is a somewhat limited historian. It seems that over the last several months he feels that everything eat "goes right through me". It does not sound like he eats a particularly healthy diet, and states that often after meals he has a lot of abdominal rumbling and bloating and then the urgent need for a BM soon afterwards. She just passes gas, other times loose stool, other times of formed stool. His appetite is good and his dry weight reportedly stable. He has at most 4 bowel movements a day. He was hospitalized for sepsis in April and again in May after dental extraction, but says the symptoms were going on prior to that. Primary care ordered a stool toxin for C. difficile, and this was negative. He had no new medicines prior to the onset of symptoms. [name] reports being given a medicine by his primary care visit, but I reviewed that March 2018 office visit and find no prescribed medicines. He has no history of pancreatitis or apparent risk factors for that other than a distant smoking history.    ROS:  Review of Systems  Constitutional: Negative for appetite change and unexpected weight change.  HENT: Negative for mouth sores and voice change.   Eyes: Negative for pain and redness.  Respiratory: Negative for cough and shortness of breath.   Cardiovascular: Positive for leg swelling. Negative for chest pain and palpitations.  Genitourinary: Negative for dysuria and hematuria.  Musculoskeletal: Negative for arthralgias and myalgias.  Skin: Negative for pallor and  rash.  Neurological: Negative for weakness and headaches.  Hematological: Negative for adenopathy.    Chronic severe leg edema  Past Medical History: Past Medical History:  Diagnosis Date  . Arthritis   . Asthma    as a child  . Chronic cystitis   . Chronic diastolic heart failure ([place])   . Chronic kidney disease    HD pt, 3 times a week.  . Diabetes mellitus   . Dysrhythmia   . GERD (gastroesophageal reflux disease)   . H/O hiatal hernia    states it's been fixed  . Hyperlipidemia   . Hypertension   . Lymphedema   . Morbid obesity ([name])   . NEPHROLITHIASIS, HX OF 12/02/2009   Qualifier: Diagnosis of  By: [name], [name]    . PVD (peripheral vascular disease) ([name])   . Renal insufficiency   . UMBILICAL HERNIA 9/79/8921   Qualifier: History of  By: [name], Audelia Acton    . Venous insufficiency    He has had diabetes for about 17 years and has been on dialysis for the last 2 years. Renal failure apparently the result of hypertension and diabetes.  Past Surgical History: Past Surgical History:  Procedure Laterality Date  . ABDOMINAL AORTOGRAM W/LOWER EXTREMITY N/A 05/19/2017   Procedure: Abdominal Aortogram w/Lower Extremity;  Surgeon: [name];  Location: [facility];  Service: Cardiovascular;  Laterality: N/A;  . AMPUTATION     Right and left fifth toes.   . AMPUTATION TOE     emoval of both little toes  . AV FISTULA  PLACEMENT Left 03/21/2014   Procedure: ARTERIOVENOUS (AV) FISTULA CREATION with ultrasound;  Surgeon: Patrick Posner, [name];  Location: [facility];  Service: Vascular;  Laterality: Left;  . COLONOSCOPY    . EYE SURGERY Bilateral    cataract and lens implant  . HERNIA REPAIR    . LIGATION OF COMPETING BRANCHES OF ARTERIOVENOUS FISTULA Left 06/29/2015   Procedure: LIGATION OF LEFT ARM RADIOCEPHALIC ARTERIOVENOUS FISTULA SIDE BRANCHES;  Surgeon: Patrick Opelika, [name];  Location: [facility];  Service: Vascular;  Laterality: Left;  [name] MULTIPLE EXTRACTIONS WITH  ALVEOLOPLASTY N/A 04/07/2017   Procedure: Extraction of tooth #'s 1-11, 13, 14,16, 20-23, and 26-28 with alveoloplasty;  Surgeon: Lenn Cal, DDS;  Location: [facility];  Service: Oral Surgery;  Laterality: N/A;  . Popliteal to posterior tibial bypass     2006  . R knee arthoscopic repair of meniscus    . UMBILICAL HERNIA REPAIR       Family History: Family History  Problem Relation Age of Onset  . Diabetes Mother   . Heart disease Mother   . Diabetes Brother     Social History: Social History   Social History  . Marital status: Legally Separated    Spouse name: N/A  . Number of children: 1  . Years of education: N/A   Occupational History  .  Disabled    Social History Main Topics  . Smoking status: Former Smoker    Quit date: 06/06/1979  . Smokeless tobacco: Former Systems developer  . Alcohol use No     Comment:   "years ago" , none now  . Drug use: No  . Sexual activity: No   Other Topics Concern  . None   Social History Narrative   Lives alone.  Only son is deceased.     Allergies: Allergies  Allergen Reactions  . Shrimp [Shellfish Allergy] Swelling    Outpatient Meds: Current Outpatient Prescriptions  Medication Sig Dispense Refill  . acetaminophen (TYLENOL) 500 MG tablet Take 1,000 mg by mouth daily as needed for moderate pain or headache.    [name] amLODipine (NORVASC) 10 MG tablet Take 10 mg by mouth at bedtime.     [name] aspirin EC 81 MG tablet Take 81 mg by mouth daily.     . calcium acetate (PHOSLO) 667 MG capsule Take 2,001-2,668 mg by mouth See admin instructions. Takes 4 capsules three times a day with meals and 3 capsules with each snacks.    . carvedilol (COREG) 6.25 MG tablet take 1 tablet by mouth twice a day with meals 180 tablet 3  . clotrimazole (LOTRIMIN) 1 % cream Apply 1 application topically 2 (two) times daily. (Patient taking differently: Apply 1 application topically daily. ) 30 g 0  . furosemide (LASIX) 80 MG tablet Take 1 tablet (80 mg total) by  mouth 2 (two) times daily. 60 tablet 2  . multivitamin (RENA-VIT) TABS tablet Take 1 tablet by mouth daily.    . urea 10 % lotion Apply topically 2 (two) times daily. (Patient taking differently: Apply 1 application topically 2 (two) times a week. ) 237 mL 0  . peg 3350 powder (MOVIPREP) 100 g SOLR Take 1 kit (200 g total) by mouth once. 1 kit 0   Current Facility-Administered Medications  Medication Dose Route Frequency Provider Last Rate Last Dose  . hydrALAZINE (APRESOLINE) injection 10 mg  10 mg Intravenous Once [name]          ___________________________________________________________________ Objective  Exam:  BP (!) 144/70 (BP Location: Left Arm, Patient Position: Sitting, Cuff Size: Normal)   Pulse 76   Ht 5' 8.11" (1.73 m) Comment: height measured without shoes  Wt 263 lb 6 oz (119.5 kg)   BMI 39.92 kg/m    General: this is a(n) Older man in no acute distress   Eyes: sclera anicteric, no redness  ENT: oral mucosa moist without lesions, no cervical or supraclavicular lymphadenopathy, good dentition  CV: RRR without murmur, S1/S2, no JVD, marketed bilateral peripheral edema wrapped in bandages. Left forearm fistula with a palpable thrill.  Resp: clear to auscultation bilaterally, normal RR and effort noted  GI: soft, no tenderness, with active bowel sounds. No guarding or palpable organomegaly noted.  Skin; warm and dry, no rash or jaundice noted  Neuro: awake, alert and oriented x 3. Normal gross motor function and fluent speech  Labs:  CMP Latest Ref Rng & Units 05/19/2017 04/16/2017 04/15/2017  Glucose 65 - 99 mg/dL 127(H) 150(H) 153(H)  BUN 6 - 20 mg/dL 30(H) 34(H) 42(H)  Creatinine 0.61 - 1.24 mg/dL 7.60(H) 10.02(H) 11.59(H)  Sodium 135 - 145 mmol/L 139 132(L) 130(L)  Potassium 3.5 - 5.1 mmol/L 3.8 4.0 3.7  Chloride 101 - 111 mmol/L 99(L) 92(L) 93(L)  CO2 22 - 32 mmol/L - 28 24  Calcium 8.9 - 10.3 mg/dL - 9.6 8.8(L)  Total Protein 6.5 - 8.1  g/dL - 8.4(H) -  Total Bilirubin 0.3 - 1.2 mg/dL - 0.3 -  Alkaline Phos 38 - 126 U/L - 62 -  AST 15 - 41 U/L - 23 -  ALT 17 - 63 U/L - 22 -   CBC Latest Ref Rng & Units 05/19/2017 04/16/2017 04/15/2017  WBC 4.0 - 10.5 K/uL - 9.8 9.6  Hemoglobin 13.0 - 17.0 g/dL 11.2(L) 9.3(L) 9.0(L)  Hematocrit 39.0 - 52.0 % 33.0(L) 28.5(L) 27.8(L)  Platelets 150 - 400 K/uL - 243 203    Neg C diff toxin 03/06/17  2D echo 03/2017:  EF 09%, grade 2 diastolic dysfunction Hgb O7S 6.8 and TSH normal in April 2018  Assessment: Encounter Diagnoses  Name Primary?  . Diarrhea, unspecified type Yes  . Abdominal bloating   . Chronic diastolic congestive heart failure ([place])   . ESRD (end stage renal disease) ([place])     Difficult to say what is causing this. Somewhat difficult to tell if it is lower digestive or small bowel in nature such as arterial overgrowth or pancreatic insufficiency. The stool is not always loose, perhaps it is some maldigestion.  Plan:  Colonoscopy  The benefits and risks of the planned procedure were described in detail with the patient or (when appropriate) their health care proxy.  Risks were outlined as including, but not limited to, bleeding, infection, perforation, adverse medication reaction leading to cardiac or pulmonary decompensation, or pancreatitis (if ERCP).  The limitation of incomplete mucosal visualization was also discussed.  No guarantees or warranties were given.   Bloating and gas written dietary advice given.  If above is unrevealing or unhelpful, consider empiric therapy for bacterial overgrowth  Thank you for the courtesy of this consult.  Please call me with any questions or concerns.  Nelida Meuse III  CC: Patrick Bussing, [name]

## 2017-06-16 NOTE — Patient Instructions (Addendum)
If you are age 68 or older, your body mass index should be between 23-30. Your Body mass index is 39.92 kg/m. If this is out of the aforementioned range listed, please consider follow up with your Primary Care Provider.  If you are age 74 or younger, your body mass index should be between 19-25. Your Body mass index is 39.92 kg/m. If this is out of the aformentioned range listed, please consider follow up with your Primary Care Provider.   You have been scheduled for a colonoscopy. Please follow written instructions given to you at your visit today.  Please pick up your prep supplies at the pharmacy within the next 1-3 days. If you use inhalers (even only as needed), please bring them with you on the day of your procedure. Your physician has requested that you go to www.startemmi.com and enter the access code given to you at your visit today. This web site gives a general overview about your procedure. However, you should still follow specific instructions given to you by our office regarding your preparation for the procedure.   Food Guidelines for gas and bloating;  Many people have difficulty digesting certain foods, causing a variety of distressing and embarrassing symptoms such as abdominal pain, bloating and gas.  These foods may need to be avoided or consumed in small amounts.  Here are some tips that might be helpful for you.  1.   Lactose intolerance is the difficulty or complete inability to digest lactose, the natural sugar in milk and anything made from milk.  This condition is harmless, common, and can begin any time during life.  Some people can digest a modest amount of lactose while others cannot tolerate any.  Also, not all dairy products contain equal amounts of lactose.  For example, hard cheeses such as parmesan have less lactose than soft cheeses such as cheddar.  Yogurt has less lactose than milk or cheese.  Many packaged foods (even many brands of bread) have milk, so read  ingredient lists carefully.  It is difficult to test for lactose intolerance, so just try avoiding lactose as much as possible for a week and see what happens with your symptoms.  If you seem to be lactose intolerant, the best plan is to avoid it (but make sure you get calcium from another source).  The next best thing is to use lactase enzyme supplements, available over the counter everywhere.  Just know that many lactose intolerant people need to take several tablets with each serving of dairy to avoid symptoms.  Lastly, a lot of restaurant food is made with milk or butter.  Many are things you might not suspect, such as mashed potatoes, rice and pasta (cooked with butter) and "grilled" items.  If you are lactose intolerant, it never hurts to ask your server what has milk or butter.  2.   Fiber is an important part of your diet, but not all fiber is well-tolerated.  Insoluble fiber such as bran is often consumed by normal gut bacteria and converted into gas.  Soluble fiber such as oats, squash, carrots and green beans are typically tolerated better.  3.   Some types of carbohydrates can be poorly digested.  Examples include: fructose (apples, cherries, pears, raisins and other dried fruits), fructans (onions, zucchini, large amounts of wheat), sorbitol/mannitol/xylitol and sucralose/Splenda (common artificial sweeteners), and raffinose (lentils, broccoli, cabbage, asparagus, brussel sprouts, many types of beans).  Do a Development worker, community for [employer] and you will find helpful  information. Beano, a dietary supplement, will often help with raffinose-containing foods.  As with lactase tablets, you may need several per serving.  4.   Whenever possible, avoid processed food&meats and chemical additives.  High fructose corn syrup, a common sweetener, may be difficult to digest.  Eggs and soy (comes from the soybean, and added to many foods now) are the other most common bloating/gassy foods.  - Dr. [facility]

## 2017-06-30 ENCOUNTER — Encounter: Payer: Self-pay | Admitting: Gastroenterology

## 2017-07-13 ENCOUNTER — Encounter: Payer: Self-pay | Admitting: Gastroenterology

## 2017-07-14 ENCOUNTER — Ambulatory Visit (AMBULATORY_SURGERY_CENTER): Payer: Medicare Other | Admitting: Gastroenterology

## 2017-07-14 ENCOUNTER — Encounter: Payer: Self-pay | Admitting: Gastroenterology

## 2017-07-14 VITALS — BP 121/51 | HR 53 | Temp 98.0°F | Resp 11 | Ht 68.0 in | Wt 263.0 lb

## 2017-07-14 DIAGNOSIS — D128 Benign neoplasm of rectum: Secondary | ICD-10-CM

## 2017-07-14 DIAGNOSIS — D129 Benign neoplasm of anus and anal canal: Secondary | ICD-10-CM

## 2017-07-14 DIAGNOSIS — R197 Diarrhea, unspecified: Secondary | ICD-10-CM

## 2017-07-14 DIAGNOSIS — D123 Benign neoplasm of transverse colon: Secondary | ICD-10-CM

## 2017-07-14 MED ORDER — SODIUM CHLORIDE 0.9 % IV SOLN: 500.0000 mL | INTRAVENOUS | Status: DC

## 2017-07-14 NOTE — Op Note (Signed)
[facility] Patient Name: [name] Procedure Date: 07/14/2017 9:21 AM MRN: [id number] Endoscopist: Mallie Mussel L. Loletha Carrow , MD Age: 66 Referring MD:  Date of Birth: September 23, 1951 Gender: Male Account #: [id number] Procedure:                Colonoscopy Indications:              Chronic diarrhea Medicines:                Monitored Anesthesia Care Procedure:                Pre-Anesthesia Assessment:                           - Prior to the procedure, a History and Physical                            was performed, and patient medications and                            allergies were reviewed. The patient's tolerance of                            previous anesthesia was also reviewed. The risks                            and benefits of the procedure and the sedation                            options and risks were discussed with the patient.                            All questions were answered, and informed consent                            was obtained. Anticoagulants: The patient has taken                            aspirin. It was decided not to withhold this                            medication prior to the procedure. ASA Grade                            Assessment: III - A patient with severe systemic                            disease. After reviewing the risks and benefits,                            the patient was deemed in satisfactory condition to                            undergo the procedure.  After obtaining informed consent, the colonoscope                            was passed under direct vision. Throughout the                            procedure, the patient's blood pressure, pulse, and                            oxygen saturations were monitored continuously. The                            Colonoscope was introduced through the anus and                            advanced to the the cecum, identified by   appendiceal orifice and ileocecal valve. The                            colonoscopy was performed without difficulty, but                            the terminal ileum could not be intubated due to                            scope looping. The patient tolerated the procedure                            well. The quality of the bowel preparation was                            excellent. The ileocecal valve, appendiceal                            orifice, and rectum were photographed. The quality                            of the bowel preparation was evaluated using the                            BBPS [facility] Bowel Preparation Scale) with scores                            of: Right Colon = 3, Transverse Colon = 3 and Left                            Colon = 3 (entire mucosa seen well with no residual                            staining, small fragments of stool or opaque                            liquid). The total BBPS score equals 9. The  bowel                            preparation used was MoviPrep. Scope In: 9:32:44 AM Scope Out: 9:53:18 AM Scope Withdrawal Time: 0 hours 14 minutes 40 seconds  Total Procedure Duration: 0 hours 20 minutes 34 seconds  Findings:                 The digital rectal exam findings include decreased                            sphincter tone.                           Normal mucosa was found in the entire colon.                            Biopsies for histology were taken with a cold                            forceps from the right colon and left colon for                            evaluation of microscopic colitis.                           A 10 mm polyp was found in the mid transverse                            colon. The polyp was pedunculated. The polyp was                            removed with a hot snare. Resection and retrieval                            were complete.                           A 2 mm polyp was found in the rectum. The polyp was                             sessile. The polyp was removed with a cold snare.                            Resection and retrieval were complete.                           The exam was otherwise without abnormality on                            direct and retroflexion views. Complications:            No immediate complications. Estimated Blood Loss:     Estimated blood loss was minimal. Impression:               - Decreased sphincter  tone found on digital rectal                            exam.                           - Normal mucosa in the entire examined colon.                            Biopsied.                           - One 10 mm polyp in the mid transverse colon,                            removed with a hot snare. Resected and retrieved.                           - One 2 mm polyp in the rectum, removed with a cold                            snare. Resected and retrieved.                           - The examination was otherwise normal on direct                            and retroflexion views. Recommendation:           - Patient has a contact number available for                            emergencies. The signs and symptoms of potential                            delayed complications were discussed with the                            patient. Return to normal activities tomorrow.                            Written discharge instructions were provided to the                            patient.                           - Resume previous diet.                           - No aspirin, ibuprofen, naproxen, or other                            non-steroidal anti-inflammatory drugs for 5 days  after polyp removal.                           - Await pathology results, then determine further                            testing/treatment regarding diarrhea.                           - Repeat colonoscopy is recommended for                            surveillance. The  colonoscopy date will be                            determined after pathology results from today's                            exam become available for review. Henry L. Loletha Carrow, MD 07/14/2017 9:59:00 AM This report has been signed electronically.

## 2017-07-14 NOTE — Progress Notes (Signed)
Called to room to assist during endoscopic procedure.  Patient ID and intended procedure confirmed with present staff. Received instructions for my participation in the procedure from the performing physician.  

## 2017-07-14 NOTE — Progress Notes (Signed)
Pt's states no medical or surgical changes since previsit or office visit. 

## 2017-07-14 NOTE — Patient Instructions (Signed)
Impression/Recommendations:  Polyp handout given to patient.  Resume previous diet.  No aspiring, ibuprofen, naproxen, or other NSAID drugs for 5 days after polyp removal.   Tylenol only until 07/20/2017.  Repeat colonoscopy recommended for surveillance.  Date to be determined after pathology results reviewed.  Await pathology results, then determine further testing or treatment regarding diarrhea.  YOU HAD AN ENDOSCOPIC PROCEDURE TODAY AT [facility]:   Refer to the procedure report that was given to you for any specific questions about what was found during the examination.  If the procedure report does not answer your questions, please call your gastroenterologist to clarify.  If you requested that your care partner not be given the details of your procedure findings, then the procedure report has been included in a sealed envelope for you to review at your convenience later.  YOU SHOULD EXPECT: Some feelings of bloating in the abdomen. Passage of more gas than usual.  Walking can help get rid of the air that was put into your GI tract during the procedure and reduce the bloating. If you had a lower endoscopy (such as a colonoscopy or flexible sigmoidoscopy) you may notice spotting of blood in your stool or on the toilet paper. If you underwent a bowel prep for your procedure, you may not have a normal bowel movement for a few days.  Please Note:  You might notice some irritation and congestion in your nose or some drainage.  This is from the oxygen used during your procedure.  There is no need for concern and it should clear up in a day or so.  SYMPTOMS TO REPORT IMMEDIATELY:   Following lower endoscopy (colonoscopy or flexible sigmoidoscopy):  Excessive amounts of blood in the stool  Significant tenderness or worsening of abdominal pains  Swelling of the abdomen that is new, acute  Fever of 100F or higher For urgent or emergent issues, a gastroenterologist can be  reached at any hour by calling [phone].   DIET:  We do recommend a small meal at first, but then you may proceed to your regular diet.  Drink plenty of fluids but you should avoid alcoholic beverages for 24 hours.  ACTIVITY:  You should plan to take it easy for the rest of today and you should NOT DRIVE or use heavy machinery until tomorrow (because of the sedation medicines used during the test).    FOLLOW UP: Our staff will call the number listed on your records the next business day following your procedure to check on you and address any questions or concerns that you may have regarding the information given to you following your procedure. If we do not reach you, we will leave a message.  However, if you are feeling well and you are not experiencing any problems, there is no need to return our call.  We will assume that you have returned to your regular daily activities without incident.  If any biopsies were taken you will be contacted by phone or by letter within the next 1-3 weeks.  Please call us at [phone] if you have not heard about the biopsies in 3 weeks.    SIGNATURES/CONFIDENTIALITY: You and/or your care partner have signed paperwork which will be entered into your electronic medical record.  These signatures attest to the fact that that the information above on your After Visit Summary has been reviewed and is understood.  Full responsibility of the confidentiality of this discharge information lies with you and/or  your care-partner.

## 2017-07-17 ENCOUNTER — Telehealth: Payer: Self-pay | Admitting: *Deleted

## 2017-07-17 NOTE — Telephone Encounter (Signed)
  Follow up Call-  Call back number 07/14/2017  Post procedure Call Back phone  # [phone]  Permission to leave phone message Yes  Some recent data might be hidden     Patient questions:  Do you have a fever, pain , or abdominal swelling? No. Pain Score  0 *  Have you tolerated food without any problems? Yes.    Have you been able to return to your normal activities? Yes.    Do you have any questions about your discharge instructions: Diet   No. Medications  No. Follow up visit  No.  Do you have questions or concerns about your Care? No.  Actions: * If pain score is 4 or above: No action needed, pain <4.

## 2017-07-20 ENCOUNTER — Encounter: Payer: Self-pay | Admitting: Gastroenterology

## 2017-07-21 ENCOUNTER — Other Ambulatory Visit: Payer: Self-pay

## 2017-07-21 DIAGNOSIS — R197 Diarrhea, unspecified: Secondary | ICD-10-CM

## 2017-08-30 ENCOUNTER — Other Ambulatory Visit: Payer: Self-pay

## 2017-08-30 ENCOUNTER — Ambulatory Visit: Payer: Self-pay | Admitting: Gastroenterology

## 2017-09-19 ENCOUNTER — Ambulatory Visit: Payer: Medicare Other | Admitting: Sports Medicine

## 2017-09-26 ENCOUNTER — Ambulatory Visit: Payer: Medicare Other | Admitting: Sports Medicine

## 2017-10-17 ENCOUNTER — Ambulatory Visit: Payer: Medicare Other | Admitting: Sports Medicine

## 2018-05-15 ENCOUNTER — Other Ambulatory Visit: Payer: Self-pay

## 2018-05-15 DIAGNOSIS — I70219 Atherosclerosis of native arteries of extremities with intermittent claudication, unspecified extremity: Secondary | ICD-10-CM

## 2018-05-15 DIAGNOSIS — I739 Peripheral vascular disease, unspecified: Secondary | ICD-10-CM

## 2018-05-31 ENCOUNTER — Inpatient Hospital Stay (HOSPITAL_COMMUNITY): Admission: RE | Admit: 2018-05-31 | Payer: Self-pay | Source: Ambulatory Visit

## 2018-05-31 ENCOUNTER — Ambulatory Visit: Payer: Medicare Other | Admitting: Family

## 2018-07-10 ENCOUNTER — Emergency Department (HOSPITAL_COMMUNITY): Payer: Medicare Other

## 2018-07-10 ENCOUNTER — Observation Stay (HOSPITAL_COMMUNITY)
Admission: EM | Admit: 2018-07-10 | Discharge: 2018-07-11 | Disposition: A | Discharge status: Home / Self Care | Payer: Medicare Other | Attending: Family Medicine | Admitting: Family Medicine

## 2018-07-10 ENCOUNTER — Other Ambulatory Visit: Payer: Self-pay

## 2018-07-10 ENCOUNTER — Encounter (HOSPITAL_COMMUNITY): Payer: Self-pay

## 2018-07-10 DIAGNOSIS — I872 Venous insufficiency (chronic) (peripheral): Secondary | ICD-10-CM | POA: Insufficient documentation

## 2018-07-10 DIAGNOSIS — I5032 Chronic diastolic (congestive) heart failure: Secondary | ICD-10-CM | POA: Insufficient documentation

## 2018-07-10 DIAGNOSIS — E1122 Type 2 diabetes mellitus with diabetic chronic kidney disease: Secondary | ICD-10-CM | POA: Diagnosis not present

## 2018-07-10 DIAGNOSIS — Z992 Dependence on renal dialysis: Secondary | ICD-10-CM | POA: Insufficient documentation

## 2018-07-10 DIAGNOSIS — K219 Gastro-esophageal reflux disease without esophagitis: Secondary | ICD-10-CM | POA: Diagnosis not present

## 2018-07-10 DIAGNOSIS — I38 Endocarditis, valve unspecified: Secondary | ICD-10-CM | POA: Insufficient documentation

## 2018-07-10 DIAGNOSIS — R5383 Other fatigue: Secondary | ICD-10-CM | POA: Insufficient documentation

## 2018-07-10 DIAGNOSIS — Z87891 Personal history of nicotine dependence: Secondary | ICD-10-CM | POA: Diagnosis not present

## 2018-07-10 DIAGNOSIS — I132 Hypertensive heart and chronic kidney disease with heart failure and with stage 5 chronic kidney disease, or end stage renal disease: Secondary | ICD-10-CM | POA: Insufficient documentation

## 2018-07-10 DIAGNOSIS — N3000 Acute cystitis without hematuria: Secondary | ICD-10-CM

## 2018-07-10 DIAGNOSIS — I890 Lymphedema, not elsewhere classified: Secondary | ICD-10-CM | POA: Insufficient documentation

## 2018-07-10 DIAGNOSIS — Z79899 Other long term (current) drug therapy: Secondary | ICD-10-CM | POA: Insufficient documentation

## 2018-07-10 DIAGNOSIS — D472 Monoclonal gammopathy: Secondary | ICD-10-CM | POA: Diagnosis not present

## 2018-07-10 DIAGNOSIS — E785 Hyperlipidemia, unspecified: Secondary | ICD-10-CM | POA: Insufficient documentation

## 2018-07-10 DIAGNOSIS — E861 Hypovolemia: Secondary | ICD-10-CM

## 2018-07-10 DIAGNOSIS — R531 Weakness: Secondary | ICD-10-CM | POA: Diagnosis present

## 2018-07-10 DIAGNOSIS — I480 Paroxysmal atrial fibrillation: Secondary | ICD-10-CM | POA: Diagnosis not present

## 2018-07-10 DIAGNOSIS — N186 End stage renal disease: Principal | ICD-10-CM | POA: Insufficient documentation

## 2018-07-10 DIAGNOSIS — D696 Thrombocytopenia, unspecified: Secondary | ICD-10-CM | POA: Diagnosis present

## 2018-07-10 DIAGNOSIS — R3589 Other polyuria: Secondary | ICD-10-CM | POA: Diagnosis present

## 2018-07-10 DIAGNOSIS — Z7982 Long term (current) use of aspirin: Secondary | ICD-10-CM | POA: Diagnosis not present

## 2018-07-10 DIAGNOSIS — R358 Other polyuria: Secondary | ICD-10-CM | POA: Diagnosis present

## 2018-07-10 LAB — BASIC METABOLIC PANEL
Anion gap: 16 — ABNORMAL HIGH (ref 5–15)
BUN: 50 mg/dL — AB (ref 8–23)
CHLORIDE: 87 mmol/L — AB (ref 98–111)
CO2: 31 mmol/L (ref 22–32)
Calcium: 8.6 mg/dL — ABNORMAL LOW (ref 8.9–10.3)
Creatinine, Ser: 11.94 mg/dL — ABNORMAL HIGH (ref 0.61–1.24)
GFR calc Af Amer: 4 mL/min — ABNORMAL LOW (ref >60)
GFR calc non Af Amer: 4 mL/min — ABNORMAL LOW (ref >60)
GLUCOSE: 231 mg/dL — AB (ref 70–99)
POTASSIUM: 3.5 mmol/L (ref 3.5–5.1)
SODIUM: 134 mmol/L — AB (ref 135–145)

## 2018-07-10 LAB — CBC
HEMATOCRIT: 35.5 % — AB (ref 39.0–52.0)
Hemoglobin: 11.8 g/dL — ABNORMAL LOW (ref 13.0–17.0)
MCH: 29.7 pg (ref 26.0–34.0)
MCHC: 33.2 g/dL (ref 30.0–36.0)
MCV: 89.4 fL (ref 78.0–100.0)
Platelets: 42 10*3/uL — ABNORMAL LOW (ref 150–400)
RBC: 3.97 MIL/uL — ABNORMAL LOW (ref 4.22–5.81)
RDW: 17.5 % — AB (ref 11.5–15.5)
WBC: 7.6 10*3/uL (ref 4.0–10.5)

## 2018-07-10 LAB — HEPATIC FUNCTION PANEL
ALBUMIN: 3.1 g/dL — AB (ref 3.5–5.0)
ALK PHOS: 63 U/L (ref 38–126)
ALT: 18 U/L (ref 0–44)
AST: 28 U/L (ref 15–41)
BILIRUBIN TOTAL: 0.5 mg/dL (ref 0.3–1.2)
Bilirubin, Direct: 0.1 mg/dL (ref 0.0–0.2)
Indirect Bilirubin: 0.4 mg/dL (ref 0.3–0.9)
TOTAL PROTEIN: 7.5 g/dL (ref 6.5–8.1)

## 2018-07-10 LAB — URINALYSIS, MICROSCOPIC (REFLEX)

## 2018-07-10 LAB — LIPASE, BLOOD: Lipase: 49 U/L (ref 11–51)

## 2018-07-10 LAB — URINALYSIS, ROUTINE W REFLEX MICROSCOPIC
Bilirubin Urine: NEGATIVE
GLUCOSE, UA: NEGATIVE mg/dL
KETONES UR: NEGATIVE mg/dL
Nitrite: NEGATIVE
PH: 8.5 — AB (ref 5.0–8.0)
Protein, ur: 100 mg/dL — AB
Specific Gravity, Urine: 1.01 (ref 1.005–1.030)

## 2018-07-10 LAB — TECHNOLOGIST SMEAR REVIEW

## 2018-07-10 LAB — PROTIME-INR
INR: 1
PROTHROMBIN TIME: 13.1 s (ref 11.4–15.2)

## 2018-07-10 LAB — I-STAT CG4 LACTIC ACID, ED
LACTIC ACID, VENOUS: 1.72 mmol/L (ref 0.5–1.9)
Lactic Acid, Venous: 2.38 mmol/L (ref 0.5–1.9)

## 2018-07-10 LAB — CBG MONITORING, ED: Glucose-Capillary: 190 mg/dL — ABNORMAL HIGH (ref 70–99)

## 2018-07-10 LAB — I-STAT TROPONIN, ED: TROPONIN I, POC: 0.04 ng/mL (ref 0.00–0.08)

## 2018-07-10 MED ORDER — SODIUM CHLORIDE 0.9 % IV BOLUS
250.0000 mL | Freq: Once | INTRAVENOUS | Status: AC
  Administered 2018-07-10: 250 mL via INTRAVENOUS

## 2018-07-10 MED ORDER — ASPIRIN EC 81 MG PO TBEC: 81.0000 mg | DELAYED_RELEASE_TABLET | Freq: Every day | ORAL | Status: DC

## 2018-07-10 MED ORDER — CARVEDILOL 6.25 MG PO TABS
6.2500 mg | ORAL_TABLET | Freq: Two times a day (BID) | ORAL | Status: DC
  Administered 2018-07-11: 6.25 mg via ORAL
  Filled 2018-07-10: qty 1

## 2018-07-10 MED ORDER — SODIUM CHLORIDE 0.9 % IV SOLN
1.0000 g | Freq: Once | INTRAVENOUS | Status: AC
  Administered 2018-07-10: 1 g via INTRAVENOUS
  Filled 2018-07-10: qty 10

## 2018-07-10 MED ORDER — ACETAMINOPHEN 325 MG PO TABS: 650.0000 mg | ORAL_TABLET | Freq: Four times a day (QID) | ORAL | Status: DC | PRN

## 2018-07-10 MED ORDER — AMLODIPINE BESYLATE 10 MG PO TABS
10.0000 mg | ORAL_TABLET | Freq: Every day | ORAL | Status: DC
  Administered 2018-07-11: 10 mg via ORAL
  Filled 2018-07-10: qty 1

## 2018-07-10 MED ORDER — FUROSEMIDE 80 MG PO TABS
80.0000 mg | ORAL_TABLET | Freq: Two times a day (BID) | ORAL | Status: DC
Start: 2018-07-11 — End: 2018-07-11
  Administered 2018-07-11: 80 mg via ORAL
  Filled 2018-07-10: qty 1

## 2018-07-10 MED ORDER — ACETAMINOPHEN 650 MG RE SUPP: 650.0000 mg | Freq: Four times a day (QID) | RECTAL | Status: DC | PRN

## 2018-07-10 NOTE — ED Provider Notes (Signed)
[facility] EMERGENCY DEPARTMENT Provider Note   CSN: [id number] Arrival date & time: 07/10/18  1225     History   Chief Complaint Chief Complaint  Patient presents with  . Fatigue    HPI [name] is a 67 y.o. male.  HPI Patient has multiple medical comorbidities including ESRD on dialysis.  Patient has Tuesday Thursday Saturday dialysis schedule.  He reports he has dialysis on Thursday, 5 days ago, they took off too much fluid and he was under his dry weight.  He reports he has not felt well since then.  He is generally felt fatigued and worn out.  Saturday he vomited 2 times.  He however describes eating normally yesterday and today.  He denies any focal pain.  He has not had documented fever.  Patient reports that despite renal failure, he makes urine 3-4 times a day.  He reports he wraps both lower extremities due to chronic swelling.  He denies any new wounds of the legs. Past Medical History:  Diagnosis Date  . Arthritis   . Asthma    as a child  . Chronic cystitis   . Chronic diastolic heart failure ([place])   . Chronic kidney disease    HD pt, 3 times a week.  . Diabetes mellitus   . Dysrhythmia   . GERD (gastroesophageal reflux disease)   . H/O hiatal hernia    states it's been fixed  . Hyperlipidemia   . Hypertension   . Lymphedema   . Morbid obesity ([place])   . NEPHROLITHIASIS, HX OF 12/02/2009   Qualifier: Diagnosis of  By: [name], Cat    . PVD (peripheral vascular disease) ([place])   . Renal insufficiency   . UMBILICAL HERNIA 5/36/6440   Qualifier: History of  By: [name] MD, Audelia Acton    . Venous insufficiency     Patient Active Problem List   Diagnosis Date Noted  . Paroxysmal atrial fibrillation ([place]) 05/26/2017  . Balanitis 04/25/2017  . Penile swelling   . Scrotal swelling   . UTI (urinary tract infection) 04/13/2017  . Anemia of chronic disease   . Acute encephalopathy 04/12/2017  . Urinary retention 04/10/2017  . Bacteremia     . RUQ abdominal pain   . Gross hematuria   . Other pancytopenia ([place])   . Sepsis ([place]) 03/21/2017  . Diarrhea 03/03/2017  . Venous stasis ulcers of both lower extremities ([place]) 08/07/2015  . Varicose veins of lower extremities with complications 34/74/2595  . Malfunction of arteriovenous dialysis fistula ([place])   . CHF (congestive heart failure) ([place]) 06/23/2015  . Lymphedema distichiasis syndrome with kidney disease and diabetes mellitus ([place]) 04/06/2015  . Lymphedema of lower extremity 04/04/2015  . DM (diabetes mellitus), type 2 with renal complications ([place]) 63/87/5643  . MGUS (monoclonal gammopathy of unknown significance) 06/12/2014  . Special screening for malignant neoplasms, colon 07/09/2013  . Proliferative diabetic retinopathy ([place]) 10/23/2012  . Knee pain 08/21/2012  . Mass of thigh 08/21/2012  . FEVER UNSPECIFIED 05/06/2010  . ORGANIC IMPOTENCE 12/02/2009  . ONYCHOMYCOSIS 07/17/2009  . Lymphedema 05/06/2009  . ESRD (end stage renal disease) ([place]) 06/09/2008  . Hyperlipidemia 03/19/2007  . OBESITY, MORBID 02/08/2007  . Essential hypertension 02/08/2007  . Atherosclerosis of native arteries of extremity with intermittent claudication ([place]) 02/08/2007    Past Surgical History:  Procedure Laterality Date  . ABDOMINAL AORTOGRAM W/LOWER EXTREMITY N/A 05/19/2017   Procedure: Abdominal Aortogram w/Lower Extremity;  Surgeon: [name], MD;  Location: [facility];  Service: Cardiovascular;  Laterality: N/A;  . AMPUTATION     Right and left fifth toes.   . AMPUTATION TOE     emoval of both little toes  . AV FISTULA PLACEMENT Left 03/21/2014   Procedure: ARTERIOVENOUS (AV) FISTULA CREATION with ultrasound;  Surgeon: [name], MD;  Location: [facility];  Service: Vascular;  Laterality: Left;  . COLONOSCOPY    . EYE SURGERY Bilateral    cataract and lens implant  . HERNIA REPAIR    . LIGATION OF COMPETING BRANCHES OF ARTERIOVENOUS FISTULA Left 06/29/2015    Procedure: LIGATION OF LEFT ARM RADIOCEPHALIC ARTERIOVENOUS FISTULA SIDE BRANCHES;  Surgeon: Conrad , MD;  Location: [facility];  Service: Vascular;  Laterality: Left;  [name] MULTIPLE EXTRACTIONS WITH ALVEOLOPLASTY N/A 04/07/2017   Procedure: Extraction of tooth #'s 1-11, 13, 14,16, 20-23, and 26-28 with alveoloplasty;  Surgeon: Lenn Cal, DDS;  Location: [facility];  Service: Oral Surgery;  Laterality: N/A;  . Popliteal to posterior tibial bypass     2006  . R knee arthoscopic repair of meniscus    . UMBILICAL HERNIA REPAIR          Home Medications    Prior to Admission medications   Medication Sig Start Date End Date Taking? Authorizing Provider  acetaminophen (TYLENOL) 500 MG tablet Take 1,000 mg by mouth daily as needed for moderate pain or headache.   Yes [provider]  amLODipine (NORVASC) 10 MG tablet Take 10 mg by mouth at bedtime.  02/03/17  Yes [provider]  aspirin EC 81 MG tablet Take 81 mg by mouth daily.    Yes [provider]  carvedilol (COREG) 6.25 MG tablet take 1 tablet by mouth twice a day with meals 05/29/17  Yes McKeag, Marylynn Pearson, MD  furosemide (LASIX) 80 MG tablet Take 1 tablet (80 mg total) by mouth 2 (two) times daily. 06/15/16  Yes McKeag, Marylynn Pearson, MD  clotrimazole (LOTRIMIN) 1 % cream Apply 1 application topically 2 (two) times daily. Patient not taking: Reported on 07/10/2018 04/15/17   Sela Hilding, MD  urea 10 % lotion Apply topically 2 (two) times daily. Patient not taking: Reported on 07/10/2018 03/28/17   [name], MD    Family History Family History  Problem Relation Age of Onset  . Diabetes Mother   . Heart disease Mother   . Diabetes Brother     Social History Social History   Tobacco Use  . Smoking status: Former Smoker    Last attempt to quit: 06/06/1979    Years since quitting: 39.1  . Smokeless tobacco: Former Network engineer Use Topics  . Alcohol use: No    Alcohol/week: 0.0 oz    Comment:    "years ago" , none now  . Drug use: No     Allergies   Shrimp [shellfish allergy]   Review of Systems Review of Systems 10 Systems reviewed and are negative for acute change except as noted in the HPI.   Physical Exam Updated Vital Signs BP (!) 166/70   Pulse (!) 56   Temp 98.7 F (37.1 C) (Oral)   Resp (!) 21   SpO2 100%   Physical Exam  Constitutional: He is oriented to person, place, and time.  Patient is alert and appropriate.  No respiratory distress.  HENT:  Head: Normocephalic and atraumatic.  Mouth/Throat: Oropharynx is clear and moist.  Eyes: EOM are normal.  Neck: Neck  supple.  Cardiovascular: Normal rate, regular rhythm and normal heart sounds.  Pulmonary/Chest: Effort normal and breath sounds normal.  Abdominal: Soft. He exhibits no distension. There is no tenderness. There is no guarding.  Musculoskeletal:  Patient has 2+ edema both lower extremities.  He has chronic skin thickening and scaling.  The sole of the right foot has a callus with an erosion in the center of it.  This is nontender.  Questionable mild swelling and erythema of the dorsum of the foot on the right.  See attached images.  Patient denies pain in these areas.  Neurological: He is alert and oriented to person, place, and time. He exhibits normal muscle tone. Coordination normal.  Skin: Skin is warm and dry.           ED Treatments / Results  Labs (all labs ordered are listed, but only abnormal results are displayed) Labs Reviewed  BASIC METABOLIC PANEL - Abnormal; Notable for the following components:      Result Value   Sodium 134 (*)    Chloride 87 (*)    Glucose, Bld 231 (*)    BUN 50 (*)    Creatinine, Ser 11.94 (*)    Calcium 8.6 (*)    GFR calc non Af Amer 4 (*)    GFR calc Af Amer 4 (*)    Anion gap 16 (*)    All other components within normal limits  CBC - Abnormal; Notable for the following components:   RBC 3.97 (*)    Hemoglobin 11.8 (*)    HCT 35.5 (*)     RDW 17.5 (*)    Platelets 42 (*)    All other components within normal limits  URINALYSIS, ROUTINE W REFLEX MICROSCOPIC - Abnormal; Notable for the following components:   APPearance TURBID (*)    pH 8.5 (*)    Hgb urine dipstick LARGE (*)    Protein, ur 100 (*)    Leukocytes, UA LARGE (*)    All other components within normal limits  URINALYSIS, MICROSCOPIC (REFLEX) - Abnormal; Notable for the following components:   Bacteria, UA MANY (*)    All other components within normal limits  HEPATIC FUNCTION PANEL - Abnormal; Notable for the following components:   Albumin 3.1 (*)    All other components within normal limits  CBG MONITORING, ED - Abnormal; Notable for the following components:   Glucose-Capillary 190 (*)    All other components within normal limits  I-STAT CG4 LACTIC ACID, ED - Abnormal; Notable for the following components:   Lactic Acid, Venous 2.38 (*)    All other components within normal limits  URINE CULTURE  CULTURE, BLOOD (ROUTINE X 2)  CULTURE, BLOOD (ROUTINE X 2)  LIPASE, BLOOD  PROTIME-INR  PATHOLOGIST SMEAR REVIEW  TECHNOLOGIST SMEAR REVIEW  PLATELET COUNT  I-STAT TROPONIN, ED  I-STAT CG4 LACTIC ACID, ED    EKG EKG Interpretation  Date/Time:  Tuesday July 10 2018 13:00:37 EDT Ventricular Rate:  58 PR Interval:    QRS Duration: 94 QT Interval:  454 QTC Calculation: 445 R Axis:   25 Text Interpretation:  Sinus bradycardia Otherwise normal ECG no change from previous Confirmed by Charlesetta Shanks [phone]) on 07/10/2018 6:13:27 PM   Radiology Dg Chest 2 View  Result Date: 07/10/2018 CLINICAL DATA:  Dialysis patient.  Fatigue EXAM: CHEST - 2 VIEW COMPARISON:  04/12/2017 FINDINGS: Heart size and vascularity normal.  Atherosclerotic aortic arch. Mild scarring in the bases with calcification along left hemidiaphragm  unchanged. No acute infiltrate or effusion. IMPRESSION: No active cardiopulmonary disease. Electronically Signed   By: Franchot Gallo M.D.    On: 07/10/2018 17:53    Procedures Procedures (including critical care time)  Medications Ordered in ED Medications  cefTRIAXone (ROCEPHIN) 1 g in sodium chloride 0.9 % 100 mL IVPB (1 g Intravenous New Bag/Given 07/10/18 1846)  sodium chloride 0.9 % bolus 250 mL (250 mLs Intravenous New Bag/Given 07/10/18 1844)     Initial Impression / Assessment and Plan / ED Course  I have reviewed the triage vital signs and the nursing notes.  Pertinent labs & imaging results that were available during my care of the patient were reviewed by me and considered in my medical decision making (see chart for details).    Consult: Reviewed with Dr. Julien Nordmann of oncology.  Advised to get peripheral smear to observe for schistocytes.  If schistocytes present patient will need treatment for TTP initiated.  If not can proceed with treatment as planned.  Further diagnostic studies for thrombocytopenia could be done on outpatient basis if no schistocytes. Consult: Family practice teaching service for admission.  Final Clinical Impressions(s) / ED Diagnoses   Final diagnoses:  Weakness  Thrombocytopenia ([place])  Acute cystitis without hematuria  ESRD (end stage renal disease) on dialysis [facility]   Patient has multiple medical comorbidities.  He presents with general fatigue and malaise for several days.  Patient was noted to have thrombocytopenia which appears to be new.  Etiology unclear.  Patient has potential sources of infection with urinalysis containing large amount of white cells.  Patient reports despite his end-stage renal disease he makes urine several times per day.  Due to potential for sepsis with initially elevated lactic acidosis and new thrombocytopenia, patient has been treated with Rocephin.  Only other site of possible infection is the foot.  Patient does not have any pain in his foot.  He has chronic problems with edema and skin changes.  There is a callus on the sole on the right and subtle  appearance of swelling and erythema.  Patient identifies this is nonpainful.  This will require careful monitoring and follow-up with podiatry.  Plan to admit to family practice teaching service for at least overnight observation for blood cultures and rule out sepsis with follow-up on new thrombocytopenia.  Peripheral smear and repeat platelets ordered. ED Discharge Orders    None       Charlesetta Shanks, MD 07/10/18 [phone]

## 2018-07-10 NOTE — ED Triage Notes (Signed)
Pt states he had dialysis last Thursday and they took too much fluid off and he was under his dry weight. Pt states since his treatment he has been feeling fatigued with some nausea and vomiting as well. He went for dialysis today but did not receive tx due to still being under dry weight.

## 2018-07-10 NOTE — ED Notes (Signed)
Admitting at bedside 

## 2018-07-10 NOTE — ED Notes (Signed)
Patient transported to X-ray 

## 2018-07-10 NOTE — ED Notes (Signed)
Restriction band placed on pt's left wrist.

## 2018-07-10 NOTE — H&P (Addendum)
[facility] Admission History and Physical Service Pager: [phone]  Patient name: [name] Medical record number: [id number] Date of birth: 1951/04/16 Age: 67 y.o. Gender: male  Primary Care Provider: Richarda Osmond, DO Consultants: none Code Status: Full  Chief Complaint: weakness  Assessment and Plan: [name] is a 67 y.o. male presenting with weakness. PMH is significant for ESRD on HD TTSa, MGUS, HTN, DM, Thrombocytopenia, paroxysmal A-fib, Diastolic CHF, lymphedema,venous insufficiency  Thrombocytopenia:  Admission CBC showed platelets down to 42.  Patient has history of thrombocytopenia, noted as early as 2016. Pt has been worked up for thrombocytopenia in the past (03/12/2018 admit) without significant findings.  Possible etiologies include: ESRD sequela, early sepsis marker, DIC, TTP, MGUS conversion to multiple myeloma. Pt does not meet SIRS criteria, qSOFA: 0 so infectious etiology unlikely. DIC/ITP/TTP unlikely based on pt stability and lack of bleeding. Multiple myeloma unlikely due to stable protein gap: 4.4 is mildly elevated but consistent with previous protein gaps(MGUS). Uptodate recommends HIV and HVC for initial workup of thrombocytopenia: HVC neg(06/13/2018), HIV pending. Most likely explanation at this time is ESRD sequela and MGUS. -Admit to [facility], attending Dr. McDiarmid -Follow-up peripheral blood smear -f/u HIV -Monitor vitals, up with assistance - AM CBC  Weakness Patient coming in after being weak since last Thursday when he had an extra 2.6L removed during HD. Reports he has felt fatigued since. On Saturday, also with 2 episodes of non-bloody, non-bilious vomiting which added to his exhaustion. Today he reports that he is feeling overall better. He came into the ED today for his weakness. Likely 2/2 overdiuresing with HD. Did not go to HD on Saturday and when he went today they chose not to do a full diuresing. Patient  reports that he is at his home weight today.  - PT/OT - s/p 255m in ED  Bacteriuria Leukocyte esterase positive in urine, nitrites negative, many bacteria on microscopy.  Pt presented with sxs of fatigue and weakness. Pt denies, dysuria, polyuria, suprapubic pain, fever. - Ceftriaxone 1 g given in ED - transition to Keflex for a 7 day course  ESRD on HD, TTS: Please see HPI for more detailed history of recent hemodialysis events.  Patient continues to make urine and urinates about 3 times a day.  GFR on admission was 4. -Return to normal hemodialysis schedule, TThS - Consider Nephrology consult if patient stays  HTN:  Systolic pressures since admission have ranged from 130s to 170s.  Patient's home blood pressure medication includes amlodipine 10 mg daily, carvedilol 6.25 mg twice daily and Lasix 80 mg twice daily. - Restart Lasix, Coreg on 7/31 - Continue amlodipine   CHF Echo done 03/24/2017 showed an EF of 55% and grade 2 diastolic dysfunction.  Home medication includes lasix, carvedilol 6.25 mg twice daily. -Continue home medication  History of paroxysmal A-fib Patient appeared to have a history of A. fib during the episode of sepsis.  During hospitalization was found to have A. fib the issue resolved and patient was not sent home on anticoagulation.  She is not currently taking regulation at home.  Not had any episodes of A. fib while in hospital. EKG in ED without any signs of AFib -No treatment at this time -Continue to monitor  Venous Insufficiency: Patient has a history of significant venous insufficiency.  Photos can be found under the media tab.  Legs are currently hyperpigmented with skin thickening.  No areas of ulceration, erythema or concerning drainage.  Patient has followed with podiatry previously but has not seen them in the past year.  Patient does not lack sensation to his feet and does not have any peripheral neuropathy.  He is aware he is a small wound on the  plantar surface of his right foot.  Appears to be well-healing without tenderness or erythema or drainage.  Not concerning for infection at this time.  - Wound care consult - F/u podiatry outpatient  Onychomycosis BL Patient may need treatment  - F/u podiatry outpatient  Diabetes type 2 Last A1c was 6.8 on 03/13/2017.  Patient is not taking any medications at home.  Blood sugars in the hospital have been 231 and 190. -A1c pending  MGUS, chronic, stable Noted in 06/2014. Patient with no red flag symptoms on exam and clinically appearing well overall - If weakness/fatigue continues without reason may need f/u  FEN/GI: Renal diet Prophylaxis: SCD's  Disposition: Place in observation  History of Present Illness:  [name] is a 67 y.o. male presenting with weakness. Patient has not felt good since last Thursday when at [facility], he has an extra 2.6L taken off of him. Reports he normally has 4 L removed but they removed 6.6 L. He did not go to his scheduled HD on Saturday because he did not feel well and had 2 episodes of non-bloody, non-bilious vomiting. He reports that this resolved by Sunday. He went to HD today as scheduled, but they did not do a full HD session, but just "recirculated" through his port. He reports he came in today because he is tired of not feeling great.  He reports his former symptoms of nausea and vomiting and entirely resolved and that general ill feeling has improved even during his time in the ED. He denies any pain, further vomiting. He does report that overall he is feeling better than last Thursday. He denies any urinary symptoms.   Review Of Systems: Per HPI with the following additions:   Review of Systems  Constitutional: Positive for malaise/fatigue. Negative for chills and fever.  Respiratory: Negative for shortness of breath.   Cardiovascular: Positive for leg swelling. Negative for chest pain.  Gastrointestinal: Negative for abdominal pain, blood in stool,  constipation, diarrhea and vomiting.  Genitourinary: Negative for dysuria and frequency.  Musculoskeletal: Negative for myalgias.  Neurological: Positive for weakness and headaches.    Patient Active Problem List   Diagnosis Date Noted  . Thrombocytopenia ([place]) 07/10/2018  . Paroxysmal atrial fibrillation ([place]) 05/26/2017  . Balanitis 04/25/2017  . Penile swelling   . Scrotal swelling   . UTI (urinary tract infection) 04/13/2017  . Anemia of chronic disease   . Acute encephalopathy 04/12/2017  . Urinary retention 04/10/2017  . Bacteremia   . RUQ abdominal pain   . Gross hematuria   . Other pancytopenia ([place])   . Sepsis ([place]) 03/21/2017  . Diarrhea 03/03/2017  . Venous stasis ulcers of both lower extremities ([place]) 08/07/2015  . Varicose veins of lower extremities with complications 76/19/5093  . Malfunction of arteriovenous dialysis fistula ([place])   . CHF (congestive heart failure) ([place]) 06/23/2015  . Lymphedema distichiasis syndrome with kidney disease and diabetes mellitus ([place]) 04/06/2015  . Lymphedema of lower extremity 04/04/2015  . DM (diabetes mellitus), type 2 with renal complications ([place]) 26/71/2458  . MGUS (monoclonal gammopathy of unknown significance) 06/12/2014  . Special screening for malignant neoplasms, colon 07/09/2013  . Proliferative diabetic retinopathy ([place]) 10/23/2012  . Knee pain 08/21/2012  . Mass of  thigh 08/21/2012  . FEVER UNSPECIFIED 05/06/2010  . ORGANIC IMPOTENCE 12/02/2009  . ONYCHOMYCOSIS 07/17/2009  . Lymphedema 05/06/2009  . ESRD (end stage renal disease) ([place]) 06/09/2008  . Hyperlipidemia 03/19/2007  . OBESITY, MORBID 02/08/2007  . Essential hypertension 02/08/2007  . Atherosclerosis of native arteries of extremity with intermittent claudication ([place]) 02/08/2007    Past Medical History: Past Medical History:  Diagnosis Date  . Arthritis   . Asthma    as a child  . Chronic cystitis   . Chronic diastolic heart failure ([place])   .  Chronic kidney disease    HD pt, 3 times a week.  . Diabetes mellitus   . Dysrhythmia   . GERD (gastroesophageal reflux disease)   . H/O hiatal hernia    states it's been fixed  . Hyperlipidemia   . Hypertension   . Lymphedema   . Morbid obesity ([place])   . NEPHROLITHIASIS, HX OF 12/02/2009   Qualifier: Diagnosis of  By: [name], Cat    . PVD (peripheral vascular disease) ([place])   . Renal insufficiency   . UMBILICAL HERNIA 0/48/8891   Qualifier: History of  By: [name] MD, Audelia Acton    . Venous insufficiency     Past Surgical History: Past Surgical History:  Procedure Laterality Date  . ABDOMINAL AORTOGRAM W/LOWER EXTREMITY N/A 05/19/2017   Procedure: Abdominal Aortogram w/Lower Extremity;  Surgeon: [name], MD;  Location: [facility];  Service: Cardiovascular;  Laterality: N/A;  . AMPUTATION     Right and left fifth toes.   . AMPUTATION TOE     emoval of both little toes  . AV FISTULA PLACEMENT Left 03/21/2014   Procedure: ARTERIOVENOUS (AV) FISTULA CREATION with ultrasound;  Surgeon: [name], MD;  Location: [facility];  Service: Vascular;  Laterality: Left;  . COLONOSCOPY    . EYE SURGERY Bilateral    cataract and lens implant  . HERNIA REPAIR    . LIGATION OF COMPETING BRANCHES OF ARTERIOVENOUS FISTULA Left 06/29/2015   Procedure: LIGATION OF LEFT ARM RADIOCEPHALIC ARTERIOVENOUS FISTULA SIDE BRANCHES;  Surgeon: Conrad Lamy, MD;  Location: [facility];  Service: Vascular;  Laterality: Left;  [name] MULTIPLE EXTRACTIONS WITH ALVEOLOPLASTY N/A 04/07/2017   Procedure: Extraction of tooth #'s 1-11, 13, 14,16, 20-23, and 26-28 with alveoloplasty;  Surgeon: Lenn Cal, DDS;  Location: [name];  Service: Oral Surgery;  Laterality: N/A;  . Popliteal to posterior tibial bypass     2006  . R knee arthoscopic repair of meniscus    . UMBILICAL HERNIA REPAIR      Social History: Social History   Tobacco Use  . Smoking status: Former Smoker    Last attempt to quit: 06/06/1979     Years since quitting: 39.1  . Smokeless tobacco: Former Network engineer Use Topics  . Alcohol use: No    Alcohol/week: 0.0 oz    Comment:   "years ago" , none now  . Drug use: No    Please also refer to relevant sections of EMR.  Family History: Family History  Problem Relation Age of Onset  . Diabetes Mother   . Heart disease Mother   . Diabetes Brother     Allergies and Medications: Allergies  Allergen Reactions  . Shrimp [Shellfish Allergy] Swelling   Current Facility-Administered Medications on File Prior to Encounter  Medication Dose Route Frequency Provider Last Rate Last Dose  . 0.9 %  sodium chloride infusion  500 mL Intravenous Continuous  Nelida Meuse III, MD      . hydrALAZINE (APRESOLINE) injection 10 mg  10 mg Intravenous Once [name], MD       Current Outpatient Medications on File Prior to Encounter  Medication Sig Dispense Refill  . acetaminophen (TYLENOL) 500 MG tablet Take 1,000 mg by mouth daily as needed for moderate pain or headache.    [name] amLODipine (NORVASC) 10 MG tablet Take 10 mg by mouth at bedtime.     [name] aspirin EC 81 MG tablet Take 81 mg by mouth daily.     . carvedilol (COREG) 6.25 MG tablet take 1 tablet by mouth twice a day with meals 180 tablet 3  . furosemide (LASIX) 80 MG tablet Take 1 tablet (80 mg total) by mouth 2 (two) times daily. 60 tablet 2  . clotrimazole (LOTRIMIN) 1 % cream Apply 1 application topically 2 (two) times daily. (Patient not taking: Reported on 07/10/2018) 30 g 0  . urea 10 % lotion Apply topically 2 (two) times daily. (Patient not taking: Reported on 07/10/2018) 237 mL 0    Objective: BP (!) 165/90   Pulse (!) 55   Temp 98.7 F (37.1 C) (Oral)   Resp 18   SpO2 100%  Exam: Physical Exam  Constitutional: He is oriented to person, place, and time. He appears well-developed and well-nourished. No distress.  Eyes: Pupils are equal, round, and reactive to light. EOM are normal.  Neck: No JVD present.   Cardiovascular: Normal rate and regular rhythm. Exam reveals no gallop and no friction rub.  Murmur (8-1/8 systolic murmur) heard. Pulmonary/Chest: Effort normal and breath sounds normal. No stridor. No respiratory distress. He has no wheezes. He has no rales. He exhibits no tenderness.  Abdominal: Soft. Bowel sounds are normal. He exhibits distension. There is no tenderness.  Musculoskeletal: He exhibits edema.  Lymphadenopathy:    He has no cervical adenopathy.  Neurological: He is alert and oriented to person, place, and time. No cranial nerve deficit.  Skin: He is not diaphoretic.  Significant LE edema with hyperpigmentation and skin thickening. Plantar surface of right foot with healing blister/dry skin, no tenderness, no erythema, no drainage.  Psychiatric: He has a normal mood and affect. His behavior is normal. Thought content normal.     Labs and Imaging: CBC BMET  Recent Labs  Lab 07/10/18 1249  WBC 7.6  HGB 11.8*  HCT 35.5*  PLT 42*   Recent Labs  Lab 07/10/18 1249  NA 134*  K 3.5  CL 87*  CO2 31  BUN 50*  CREATININE 11.94*  GLUCOSE 231*  CALCIUM 8.6*     Dg Chest 2 View  Result Date: 07/10/2018 CLINICAL DATA:  Dialysis patient.  Fatigue EXAM: CHEST - 2 VIEW COMPARISON:  04/12/2017 FINDINGS: Heart size and vascularity normal.  Atherosclerotic aortic arch. Mild scarring in the bases with calcification along left hemidiaphragm unchanged. No acute infiltrate or effusion. IMPRESSION: No active cardiopulmonary disease. Electronically Signed   By: Franchot Gallo M.D.   On: 07/10/2018 17:53     Matilde Haymaker, MD 07/10/2018, 11:14 PM PGY-1, [place] Intern pager: [phone], text pages welcome  FPTS Upper-Level Resident Addendum   I have independently interviewed and examined the patient. I have discussed the above with the original author and agree with their documentation. My edits for correction/addition/clarification are in purple. Please  see also any attending notes.    [place] Eliott Amparan, DO PGY-2, [facility] 07/11/2018 1:01  AM  FPTS Service pager: [phone] (text pages welcome through [facility])

## 2018-07-10 NOTE — ED Notes (Signed)
Lactic acid of 2.38, notified to Dr. Johnney Killian.

## 2018-07-11 ENCOUNTER — Telehealth: Payer: Self-pay | Admitting: Hematology

## 2018-07-11 ENCOUNTER — Encounter: Payer: Self-pay | Admitting: Hematology

## 2018-07-11 DIAGNOSIS — E861 Hypovolemia: Secondary | ICD-10-CM

## 2018-07-11 DIAGNOSIS — Z992 Dependence on renal dialysis: Secondary | ICD-10-CM

## 2018-07-11 DIAGNOSIS — R531 Weakness: Secondary | ICD-10-CM

## 2018-07-11 DIAGNOSIS — D472 Monoclonal gammopathy: Secondary | ICD-10-CM

## 2018-07-11 DIAGNOSIS — D696 Thrombocytopenia, unspecified: Secondary | ICD-10-CM

## 2018-07-11 DIAGNOSIS — N186 End stage renal disease: Principal | ICD-10-CM

## 2018-07-11 DIAGNOSIS — R358 Other polyuria: Secondary | ICD-10-CM

## 2018-07-11 LAB — CBC
HCT: 33.8 % — ABNORMAL LOW (ref 39.0–52.0)
Hemoglobin: 11 g/dL — ABNORMAL LOW (ref 13.0–17.0)
MCH: 29.6 pg (ref 26.0–34.0)
MCHC: 32.5 g/dL (ref 30.0–36.0)
MCV: 90.9 fL (ref 78.0–100.0)
Platelets: 50 10*3/uL — ABNORMAL LOW (ref 150–400)
RBC: 3.72 MIL/uL — ABNORMAL LOW (ref 4.22–5.81)
RDW: 17.9 % — AB (ref 11.5–15.5)
WBC: 7.1 10*3/uL (ref 4.0–10.5)

## 2018-07-11 LAB — HEMOGLOBIN A1C
HEMOGLOBIN A1C: 6.2 % — AB (ref 4.8–5.6)
MEAN PLASMA GLUCOSE: 131.24 mg/dL

## 2018-07-11 LAB — BASIC METABOLIC PANEL
Anion gap: 12 (ref 5–15)
BUN: 55 mg/dL — AB (ref 8–23)
CO2: 31 mmol/L (ref 22–32)
Calcium: 8.6 mg/dL — ABNORMAL LOW (ref 8.9–10.3)
Chloride: 93 mmol/L — ABNORMAL LOW (ref 98–111)
Creatinine, Ser: 12.86 mg/dL — ABNORMAL HIGH (ref 0.61–1.24)
GFR calc Af Amer: 4 mL/min — ABNORMAL LOW (ref >60)
GFR, EST NON AFRICAN AMERICAN: 3 mL/min — AB (ref >60)
GLUCOSE: 214 mg/dL — AB (ref 70–99)
Potassium: 4.1 mmol/L (ref 3.5–5.1)
Sodium: 136 mmol/L (ref 135–145)

## 2018-07-11 LAB — PATHOLOGIST SMEAR REVIEW

## 2018-07-11 LAB — HIV ANTIBODY (ROUTINE TESTING W REFLEX): HIV SCREEN 4TH GENERATION: NONREACTIVE

## 2018-07-11 NOTE — Progress Notes (Signed)
Pt received from ED. Alert and oriented x4. Denies c/o pain. Has chronic swelling to BLE due to venous insufficiency,lymphedema noted CDI leg wraps. Oriented to room and call bell.

## 2018-07-11 NOTE — Discharge Summary (Signed)
[facility] Discharge Summary  Patient name: [name] Medical record number: [id number] Date of birth: 01/15/1951 Age: 67 y.o. Gender: male Date of Admission: 07/10/2018  Date of Discharge: 07/11/2018 Admitting Physician: Blane Ohara McDiarmid, MD  Primary Care Provider: Richarda Osmond, DO Consultants: none  Indication for Hospitalization: Fatigue and Thrombocytopenia  Discharge Diagnoses/Problem List:  ESRD Thrombocytopenia MGUS Endocarditis HTN T2DM Paroxysmal A. Fib HFpEF Venous Insufficiency  Disposition: Discharge home  Discharge Condition: Stable  Discharge Exam:  Constitutional: He is oriented to person, place, and time. He appears well-developed and well-nourished. No distress.  Eyes: Pupils are equal, round, and reactive to light. EOM are normal.  Neck: No JVD present.  Cardiovascular: Normal rate and regular rhythm. Exam reveals no gallop and no friction rub. Murmur (2/6 systolic murmur) heard. Pulmonary/Chest: Effort normal and breath sounds normal. No stridor. No respiratory distress. He has no wheezes. He has no rales. He exhibits no tenderness.  Abdominal: Soft. Bowel sounds are normal. He exhibits distension. There is no tenderness.  Musculoskeletal: He exhibits edema.  Lymphadenopathy: He has no cervical adenopathy.  Neurological: He is alert and oriented to person, place, and time. No cranial nerve deficit.  Skin: He is not diaphoretic. Significant LE edema with hyperpigmentation and skin thickening. Plantar surface of right foot with healing blister/dry skin, no tenderness, no erythema, no drainage.  Psychiatric: He has a normal mood and affect. His behavior is normal. Thought content normal.   Brief Hospital Course: [name] is a 67 year old male with ESRD and MGUS fatigued most likely from over-diuresing. He was given 266mL NS bolus in the ED and felt better. Lactic Acid was initially found to be 2.38 but decreased to  1.72. UA showed leukocytes without nitrites and patient was treated for asymptomatic bacteruria with one dose of ceftriaxone. The patient was also found to be thrombocytopenic with tear cells on smear and was admitted for concern that this was a new finding. Old records revealed the thrombocytopenia to be a chronic problem. The patient also admitted to not following up for his MGUS in three years or so. The patient remained medically stable and was discharged home with instructions to follow up with oncology.     Issues for Follow Up:  1. Follow up with oncology for MGUS and thrombocytopenia. 2. Discontinue Aspirin due to thrombocytopenia.  Significant Procedures: none  Significant Labs and Imaging:  Recent Labs  Lab 07/10/18 1249 07/11/18 0018  WBC 7.6 7.1  HGB 11.8* 11.0*  HCT 35.5* 33.8*  PLT 42* 50*   Recent Labs  Lab 07/10/18 1249 07/10/18 1654 07/11/18 0018  NA 134*  --  136  K 3.5  --  4.1  CL 87*  --  93*  CO2 31  --  31  GLUCOSE 231*  --  214*  BUN 50*  --  55*  CREATININE 11.94*  --  12.86*  CALCIUM 8.6*  --  8.6*  ALKPHOS  --  63  --   AST  --  28  --   ALT  --  18  --   ALBUMIN  --  3.1*  --    Urinalysis    Component Value Date/Time   COLORURINE YELLOW 07/10/2018 1528   APPEARANCEUR TURBID (A) 07/10/2018 1528   LABSPEC 1.010 07/10/2018 1528   PHURINE 8.5 (H) 07/10/2018 1528   GLUCOSEU NEGATIVE 07/10/2018 1528   HGBUR LARGE (A) 07/10/2018 1528   HGBUR negative 04/13/2007 0821   BILIRUBINUR  NEGATIVE 07/10/2018 1528   BILIRUBINUR NEG 08/21/2015 1050   KETONESUR NEGATIVE 07/10/2018 1528   PROTEINUR 100 (A) 07/10/2018 1528   UROBILINOGEN 0.2 08/21/2015 1050   UROBILINOGEN 0.2 03/07/2014 1153   NITRITE NEGATIVE 07/10/2018 1528   LEUKOCYTESUR LARGE (A) 07/10/2018 1528   Results/Tests Pending at Time of Discharge:  - HIV Ab - Blood Cx - Urine Cx - Pathologist smear review   Discharge Medications:  Allergies as of 07/11/2018      Reactions   Shrimp  [shellfish Allergy] Swelling      Medication List    STOP taking these medications   aspirin EC 81 MG tablet     TAKE these medications   acetaminophen 500 MG tablet Commonly known as:  TYLENOL Take 1,000 mg by mouth daily as needed for moderate pain or headache.   amLODipine 10 MG tablet Commonly known as:  NORVASC Take 10 mg by mouth at bedtime.   carvedilol 6.25 MG tablet Commonly known as:  COREG take 1 tablet by mouth twice a day with meals   clotrimazole 1 % cream Commonly known as:  LOTRIMIN Apply 1 application topically 2 (two) times daily.   furosemide 80 MG tablet Commonly known as:  LASIX Take 1 tablet (80 mg total) by mouth 2 (two) times daily.   urea 10 % lotion Apply topically 2 (two) times daily.       Discharge Instructions: Please refer to Patient Instructions section of EMR for full details.  Patient was counseled important signs and symptoms that should prompt return to medical care, changes in medications, dietary instructions, activity restrictions, and follow up appointments.   Follow-Up Appointments: August 10, 1:00pm with Dr. Irene Limbo Follow-up Information    Brunetta Genera, MD. Go on 07/31/2018.   Specialties:  Hematology, Oncology Why:  at 1 pm Contact information: [place] [address] [phone]           Daisy Floro, DO 07/11/2018, 8:27 AM PGY-1, [place]

## 2018-07-11 NOTE — Progress Notes (Signed)
Pt discharged home in stable condition after going over discharge teaching with no concerns voiced. AVS given before leaving unit 

## 2018-07-11 NOTE — Telephone Encounter (Signed)
I received a call from Dr. Vanetta Shawl to schedule a hematology appt for Mr. Windt. He is currently in the hospital at this time. Per Dr. Vanetta Shawl, this is a non-urgent referral. I scheduled the pt to see Dr. Irene Limbo on 8/20 at 1pm. Will mail the pt a letter with the appt information. Dr. Vanetta Shawl will give the pt the appt date and time as well.

## 2018-07-12 LAB — URINE CULTURE

## 2018-07-15 LAB — CULTURE, BLOOD (ROUTINE X 2)
CULTURE: NO GROWTH
Culture: NO GROWTH
Special Requests: ADEQUATE

## 2018-07-31 ENCOUNTER — Inpatient Hospital Stay: Payer: Medicare Other | Attending: Hematology | Admitting: Hematology

## 2018-08-22 ENCOUNTER — Ambulatory Visit: Payer: Self-pay | Admitting: Student in an Organized Health Care Education/Training Program

## 2018-09-10 ENCOUNTER — Other Ambulatory Visit: Payer: Self-pay

## 2018-09-10 ENCOUNTER — Observation Stay (HOSPITAL_COMMUNITY)
Admission: EM | Admit: 2018-09-10 | Discharge: 2018-09-12 | Disposition: A | Discharge status: Home / Self Care | Payer: Medicare Other | Attending: Family Medicine | Admitting: Family Medicine

## 2018-09-10 ENCOUNTER — Encounter (HOSPITAL_COMMUNITY): Payer: Self-pay | Admitting: Emergency Medicine

## 2018-09-10 ENCOUNTER — Emergency Department (HOSPITAL_COMMUNITY): Payer: Medicare Other

## 2018-09-10 DIAGNOSIS — E785 Hyperlipidemia, unspecified: Secondary | ICD-10-CM | POA: Diagnosis not present

## 2018-09-10 DIAGNOSIS — E1122 Type 2 diabetes mellitus with diabetic chronic kidney disease: Secondary | ICD-10-CM | POA: Insufficient documentation

## 2018-09-10 DIAGNOSIS — I872 Venous insufficiency (chronic) (peripheral): Secondary | ICD-10-CM | POA: Diagnosis not present

## 2018-09-10 DIAGNOSIS — Z91013 Allergy to seafood: Secondary | ICD-10-CM | POA: Insufficient documentation

## 2018-09-10 DIAGNOSIS — I890 Lymphedema, not elsewhere classified: Secondary | ICD-10-CM | POA: Insufficient documentation

## 2018-09-10 DIAGNOSIS — E1121 Type 2 diabetes mellitus with diabetic nephropathy: Secondary | ICD-10-CM

## 2018-09-10 DIAGNOSIS — I132 Hypertensive heart and chronic kidney disease with heart failure and with stage 5 chronic kidney disease, or end stage renal disease: Secondary | ICD-10-CM | POA: Insufficient documentation

## 2018-09-10 DIAGNOSIS — I420 Dilated cardiomyopathy: Secondary | ICD-10-CM | POA: Diagnosis not present

## 2018-09-10 DIAGNOSIS — I878 Other specified disorders of veins: Secondary | ICD-10-CM

## 2018-09-10 DIAGNOSIS — Z8619 Personal history of other infectious and parasitic diseases: Secondary | ICD-10-CM | POA: Insufficient documentation

## 2018-09-10 DIAGNOSIS — Z79899 Other long term (current) drug therapy: Secondary | ICD-10-CM | POA: Insufficient documentation

## 2018-09-10 DIAGNOSIS — D472 Monoclonal gammopathy: Secondary | ICD-10-CM | POA: Diagnosis not present

## 2018-09-10 DIAGNOSIS — Z6835 Body mass index (BMI) 35.0-35.9, adult: Secondary | ICD-10-CM | POA: Insufficient documentation

## 2018-09-10 DIAGNOSIS — I959 Hypotension, unspecified: Secondary | ICD-10-CM

## 2018-09-10 DIAGNOSIS — I700 Atherosclerosis of aorta: Secondary | ICD-10-CM | POA: Diagnosis not present

## 2018-09-10 DIAGNOSIS — Z87442 Personal history of urinary calculi: Secondary | ICD-10-CM | POA: Diagnosis not present

## 2018-09-10 DIAGNOSIS — N186 End stage renal disease: Secondary | ICD-10-CM | POA: Insufficient documentation

## 2018-09-10 DIAGNOSIS — Z992 Dependence on renal dialysis: Secondary | ICD-10-CM | POA: Diagnosis not present

## 2018-09-10 DIAGNOSIS — R001 Bradycardia, unspecified: Secondary | ICD-10-CM | POA: Diagnosis not present

## 2018-09-10 DIAGNOSIS — Z87440 Personal history of urinary (tract) infections: Secondary | ICD-10-CM | POA: Insufficient documentation

## 2018-09-10 DIAGNOSIS — N179 Acute kidney failure, unspecified: Secondary | ICD-10-CM | POA: Diagnosis not present

## 2018-09-10 DIAGNOSIS — I5032 Chronic diastolic (congestive) heart failure: Secondary | ICD-10-CM | POA: Diagnosis not present

## 2018-09-10 DIAGNOSIS — Z8249 Family history of ischemic heart disease and other diseases of the circulatory system: Secondary | ICD-10-CM | POA: Insufficient documentation

## 2018-09-10 DIAGNOSIS — Z87891 Personal history of nicotine dependence: Secondary | ICD-10-CM | POA: Diagnosis not present

## 2018-09-10 DIAGNOSIS — R079 Chest pain, unspecified: Principal | ICD-10-CM | POA: Diagnosis present

## 2018-09-10 DIAGNOSIS — Z833 Family history of diabetes mellitus: Secondary | ICD-10-CM | POA: Insufficient documentation

## 2018-09-10 DIAGNOSIS — R918 Other nonspecific abnormal finding of lung field: Secondary | ICD-10-CM | POA: Diagnosis not present

## 2018-09-10 DIAGNOSIS — I051 Rheumatic mitral insufficiency: Secondary | ICD-10-CM | POA: Insufficient documentation

## 2018-09-10 DIAGNOSIS — M1990 Unspecified osteoarthritis, unspecified site: Secondary | ICD-10-CM | POA: Diagnosis not present

## 2018-09-10 DIAGNOSIS — E861 Hypovolemia: Secondary | ICD-10-CM | POA: Insufficient documentation

## 2018-09-10 DIAGNOSIS — R509 Fever, unspecified: Secondary | ICD-10-CM

## 2018-09-10 DIAGNOSIS — I951 Orthostatic hypotension: Secondary | ICD-10-CM

## 2018-09-10 DIAGNOSIS — Z8679 Personal history of other diseases of the circulatory system: Secondary | ICD-10-CM

## 2018-09-10 DIAGNOSIS — Z9889 Other specified postprocedural states: Secondary | ICD-10-CM | POA: Insufficient documentation

## 2018-09-10 DIAGNOSIS — R0789 Other chest pain: Secondary | ICD-10-CM

## 2018-09-10 LAB — CREATININE, SERUM
CREATININE: 15.71 mg/dL — AB (ref 0.61–1.24)
GFR, EST AFRICAN AMERICAN: 3 mL/min — AB (ref >60)
GFR, EST NON AFRICAN AMERICAN: 3 mL/min — AB (ref >60)

## 2018-09-10 LAB — TROPONIN I
TROPONIN I: 0.03 ng/mL — AB (ref <0.03)
Troponin I: 0.03 ng/mL (ref <0.03)

## 2018-09-10 LAB — BASIC METABOLIC PANEL
Anion gap: 15 (ref 5–15)
BUN: 70 mg/dL — AB (ref 8–23)
CHLORIDE: 96 mmol/L — AB (ref 98–111)
CO2: 27 mmol/L (ref 22–32)
Calcium: 8.8 mg/dL — ABNORMAL LOW (ref 8.9–10.3)
Creatinine, Ser: 15.5 mg/dL — ABNORMAL HIGH (ref 0.61–1.24)
GFR calc Af Amer: 3 mL/min — ABNORMAL LOW (ref >60)
GFR calc non Af Amer: 3 mL/min — ABNORMAL LOW (ref >60)
GLUCOSE: 213 mg/dL — AB (ref 70–99)
POTASSIUM: 4.3 mmol/L (ref 3.5–5.1)
SODIUM: 138 mmol/L (ref 135–145)

## 2018-09-10 LAB — CBC
HCT: 34.2 % — ABNORMAL LOW (ref 39.0–52.0)
HEMOGLOBIN: 10.8 g/dL — AB (ref 13.0–17.0)
MCH: 30.7 pg (ref 26.0–34.0)
MCHC: 31.6 g/dL (ref 30.0–36.0)
MCV: 97.2 fL (ref 78.0–100.0)
Platelets: 66 10*3/uL — ABNORMAL LOW (ref 150–400)
RBC: 3.52 MIL/uL — ABNORMAL LOW (ref 4.22–5.81)
RDW: 17.5 % — ABNORMAL HIGH (ref 11.5–15.5)
WBC: 5.7 10*3/uL (ref 4.0–10.5)

## 2018-09-10 LAB — GLUCOSE, CAPILLARY
GLUCOSE-CAPILLARY: 176 mg/dL — AB (ref 70–99)
GLUCOSE-CAPILLARY: 154 mg/dL — AB (ref 70–99)

## 2018-09-10 LAB — URINALYSIS, ROUTINE W REFLEX MICROSCOPIC
Bilirubin Urine: NEGATIVE
Glucose, UA: NEGATIVE mg/dL
Ketones, ur: NEGATIVE mg/dL
NITRITE: NEGATIVE
PROTEIN: 100 mg/dL — AB
Specific Gravity, Urine: 1.01 (ref 1.005–1.030)
pH: 9 — ABNORMAL HIGH (ref 5.0–8.0)

## 2018-09-10 LAB — I-STAT CHEM 8, ED
BUN: 65 mg/dL — ABNORMAL HIGH (ref 8–23)
CHLORIDE: 99 mmol/L (ref 98–111)
CREATININE: 16.2 mg/dL — AB (ref 0.61–1.24)
Calcium, Ion: 1.02 mmol/L — ABNORMAL LOW (ref 1.15–1.40)
GLUCOSE: 215 mg/dL — AB (ref 70–99)
HCT: 33 % — ABNORMAL LOW (ref 39.0–52.0)
Hemoglobin: 11.2 g/dL — ABNORMAL LOW (ref 13.0–17.0)
POTASSIUM: 4.2 mmol/L (ref 3.5–5.1)
Sodium: 138 mmol/L (ref 135–145)
TCO2: 29 mmol/L (ref 22–32)

## 2018-09-10 LAB — I-STAT TROPONIN, ED
TROPONIN I, POC: 0.04 ng/mL (ref 0.00–0.08)
Troponin i, poc: 0.07 ng/mL (ref 0.00–0.08)

## 2018-09-10 MED ORDER — CARVEDILOL 6.25 MG PO TABS: 6.2500 mg | ORAL_TABLET | Freq: Two times a day (BID) | ORAL | Status: DC

## 2018-09-10 MED ORDER — ACETAMINOPHEN 325 MG PO TABS: 650.0000 mg | ORAL_TABLET | Freq: Four times a day (QID) | ORAL | Status: DC | PRN

## 2018-09-10 MED ORDER — ACETAMINOPHEN 650 MG RE SUPP: 650.0000 mg | Freq: Four times a day (QID) | RECTAL | Status: DC | PRN

## 2018-09-10 MED ORDER — INSULIN ASPART 100 UNIT/ML ~~LOC~~ SOLN
0.0000 [IU] | Freq: Three times a day (TID) | SUBCUTANEOUS | Status: DC
  Administered 2018-09-11 – 2018-09-12 (×3): 1 [IU] via SUBCUTANEOUS

## 2018-09-10 MED ORDER — ALUM & MAG HYDROXIDE-SIMETH 200-200-20 MG/5ML PO SUSP
15.0000 mL | Freq: Once | ORAL | Status: AC
  Administered 2018-09-10: 15 mL via ORAL
  Filled 2018-09-10: qty 30

## 2018-09-10 MED ORDER — POLYETHYLENE GLYCOL 3350 17 G PO PACK: 17.0000 g | PACK | Freq: Every day | ORAL | Status: DC | PRN

## 2018-09-10 MED ORDER — AMLODIPINE BESYLATE 10 MG PO TABS
10.0000 mg | ORAL_TABLET | Freq: Every day | ORAL | Status: DC
  Administered 2018-09-11: 10 mg via ORAL
  Filled 2018-09-10: qty 1

## 2018-09-10 NOTE — ED Triage Notes (Signed)
Pt to ER for 2 days left chest pain described as pressure. Denies shortness of breath, cough. States last dialysis treatment Saturday, and "was given antibiotics for a fever." Pt in NAD.

## 2018-09-10 NOTE — Progress Notes (Signed)
FPTS Interim Progress Note  S:patient states his legs do not hurt, but they itch sometimes. He states he moisturizes his legs.    O: BP (!) 175/67   Pulse (!) 52   Temp 98 F (36.7 C) (Oral)   Resp 19   SpO2 100%    Skin: unna boots were removed bilaterally.  No ulcerations seen on either leg or foot.  Hyperpigmentation and ichthyotic skin underneath unna boot.  Flaking off of skin.  nontender to palpation.    A/P:  No signs of cellulitis.  Continue to monitor.  Leaving off unna boot tonight.  Will consider rewrapping tomorrow.     Benay Pike, MD 09/10/2018, 7:58 PM PGY-1, [place] Medicine Service pager [phone]

## 2018-09-10 NOTE — ED Provider Notes (Signed)
[facility] EMERGENCY DEPARTMENT Provider Note   CSN: [id number] Arrival date & time: 09/10/18  0706     History   Chief Complaint Chief Complaint  Patient presents with  . Chest Pain    HPI [name] is a 67 y.o. male.  67 yo M with a chief complaint of chest pain.  This been going on for the past 2 days.  Feels like a pressure across his chest.  Worse when he stands up and starts walking.  Denies diaphoresis nausea or vomiting.  First noticed this when he was at dialysis on Saturday.  Since then the pain seems to have been persistent.  Nothing seems to make it better.  Has some shortness of breath associated with it.  Has a history of heart failure but denies history of prior heart attack.  History of hypertension.  Denies hyperlipidemia diabetes smoking or family history.  Has a reported history of a DVT in the past, though I am unable to see any record of this in his medical history.  He had a fever of unknown height that he has been getting antibiotics for a dialysis.  The history is provided by the patient.  Chest Pain   This is a new problem. The problem occurs constantly. The problem has not changed since onset.The pain is associated with movement. The pain is present in the substernal region. The pain is at a severity of 4/10. The pain is moderate. The quality of the pain is described as brief and heavy. The pain does not radiate. Duration of episode(s) is 2 days. Associated symptoms include shortness of breath. Pertinent negatives include no abdominal pain, no fever, no headaches, no palpitations and no vomiting. He has tried nothing for the symptoms. The treatment provided no relief.  His past medical history is significant for DVT.  Pertinent negatives for past medical history include no MI and no PE.  Pertinent negatives for family medical history include: no early MI.    Past Medical History:  Diagnosis Date  . Arthritis   . Asthma    as a child    . Chronic cystitis   . Chronic diastolic heart failure ([place])   . Chronic kidney disease    HD pt, 3 times a week.  . Diabetes mellitus   . Dysrhythmia   . GERD (gastroesophageal reflux disease)   . H/O hiatal hernia    states it's been fixed  . Hyperlipidemia   . Hypertension   . Lymphedema   . Morbid obesity ([place])   . NEPHROLITHIASIS, HX OF 12/02/2009   Qualifier: Diagnosis of  By: [name], Cat    . PVD (peripheral vascular disease) ([place])   . Renal insufficiency   . UMBILICAL HERNIA 7/41/2878   Qualifier: History of  By: [name] MD, Audelia Acton    . Venous insufficiency     Patient Active Problem List   Diagnosis Date Noted  . Venous stasis   . History of endocarditis   . Bradycardia   . Orthostasis   . Chest pain 09/10/2018  . Hypovolemia associated with hemodialysis 07/11/2018  . Weakness   . Thrombocytopenia ([place]) 07/10/2018  . Paroxysmal atrial fibrillation ([place]) 05/26/2017  . Balanitis 04/25/2017  . Penile swelling   . Scrotal swelling   . UTI (urinary tract infection) 04/13/2017  . Anemia of chronic disease   . Acute encephalopathy 04/12/2017  . Urinary retention 04/10/2017  . Bacteremia   . RUQ abdominal pain   .  Gross hematuria   . Other pancytopenia ([place])   . Sepsis ([place]) 03/21/2017  . Diarrhea 03/03/2017  . Venous stasis ulcers of both lower extremities ([place]) 08/07/2015  . Varicose veins of lower extremities with complications 38/18/2993  . Malfunction of arteriovenous dialysis fistula ([place])   . CHF (congestive heart failure) ([place]) 06/23/2015  . Lymphedema distichiasis syndrome with kidney disease and diabetes mellitus ([place]) 04/06/2015  . Lymphedema of lower extremity 04/04/2015  . Type II diabetes mellitus with nephropathy ([place]) 04/04/2015  . MGUS (monoclonal gammopathy of unknown significance) 06/12/2014  . Special screening for malignant neoplasms, colon 07/09/2013  . Proliferative diabetic retinopathy ([place]) 10/23/2012  . Knee pain 08/21/2012  . Mass  of thigh 08/21/2012  . FEVER UNSPECIFIED 05/06/2010  . ORGANIC IMPOTENCE 12/02/2009  . ONYCHOMYCOSIS 07/17/2009  . Lymphedema 05/06/2009  . ESRD on dialysis ([place]) 06/09/2008  . Hyperlipidemia 03/19/2007  . Class 2 severe obesity due to excess calories with serious comorbidity and body mass index (BMI) of 35.0 to 35.9 in adult ([place]) 02/08/2007  . Essential hypertension 02/08/2007  . Atherosclerosis of native arteries of extremity with intermittent claudication ([place]) 02/08/2007    Past Surgical History:  Procedure Laterality Date  . ABDOMINAL AORTOGRAM W/LOWER EXTREMITY N/A 05/19/2017   Procedure: Abdominal Aortogram w/Lower Extremity;  Surgeon: [name], MD;  Location: [facility];  Service: Cardiovascular;  Laterality: N/A;  . AMPUTATION     Right and left fifth toes.   . AMPUTATION TOE     emoval of both little toes  . AV FISTULA PLACEMENT Left 03/21/2014   Procedure: ARTERIOVENOUS (AV) FISTULA CREATION with ultrasound;  Surgeon: [name], MD;  Location: [facility];  Service: Vascular;  Laterality: Left;  . COLONOSCOPY    . EYE SURGERY Bilateral    cataract and lens implant  . HERNIA REPAIR    . LIGATION OF COMPETING BRANCHES OF ARTERIOVENOUS FISTULA Left 06/29/2015   Procedure: LIGATION OF LEFT ARM RADIOCEPHALIC ARTERIOVENOUS FISTULA SIDE BRANCHES;  Surgeon: Conrad Levelland, MD;  Location: [facility];  Service: Vascular;  Laterality: Left;  [name] MULTIPLE EXTRACTIONS WITH ALVEOLOPLASTY N/A 04/07/2017   Procedure: Extraction of tooth #'s 1-11, 13, 14,16, 20-23, and 26-28 with alveoloplasty;  Surgeon: Lenn Cal, DDS;  Location: [facility];  Service: Oral Surgery;  Laterality: N/A;  . Popliteal to posterior tibial bypass     2006  . R knee arthoscopic repair of meniscus    . UMBILICAL HERNIA REPAIR          Home Medications    Prior to Admission medications   Medication Sig Start Date End Date Taking? Authorizing Provider  acetaminophen (TYLENOL) 500 MG tablet Take 1,000 mg  by mouth daily as needed for moderate pain or headache.   Yes [provider]  amLODipine (NORVASC) 10 MG tablet Take 10 mg by mouth at bedtime.  02/03/17  Yes [provider]  furosemide (LASIX) 80 MG tablet Take 1 tablet (80 mg total) by mouth 2 (two) times daily. 06/15/16  Yes McKeag, Marylynn Pearson, MD  calcitRIOL (ROCALTROL) 0.5 MCG capsule Take 3 capsules (1.5 mcg total) by mouth Every Tuesday,Thursday,and Saturday with dialysis. 09/13/18   Meccariello, Bernita Raisin, DO  cinacalcet (SENSIPAR) 30 MG tablet Take 2 tablets (60 mg total) by mouth every Tuesday, Thursday, and Saturday at 6 PM. 09/13/18   Meccariello, Bernita Raisin, DO  multivitamin (RENA-VIT) TABS tablet Take 1 tablet by mouth at bedtime. 09/12/18   Meccariello, Bernita Raisin, DO  Family History Family History  Problem Relation Age of Onset  . Diabetes Mother   . Heart disease Mother   . Diabetes Brother     Social History Social History   Tobacco Use  . Smoking status: Former Smoker    Last attempt to quit: 06/06/1979    Years since quitting: 39.2  . Smokeless tobacco: Former Network engineer Use Topics  . Alcohol use: No    Alcohol/week: 0.0 standard drinks    Comment:   "years ago" , none now  . Drug use: No     Allergies   Shrimp [shellfish allergy]   Review of Systems Review of Systems  Constitutional: Negative for chills and fever.  HENT: Negative for congestion and facial swelling.   Eyes: Negative for discharge and visual disturbance.  Respiratory: Positive for shortness of breath.   Cardiovascular: Positive for chest pain. Negative for palpitations.  Gastrointestinal: Negative for abdominal pain, diarrhea and vomiting.  Musculoskeletal: Negative for arthralgias and myalgias.  Skin: Negative for color change and rash.  Neurological: Negative for tremors, syncope and headaches.  Psychiatric/Behavioral: Negative for confusion and dysphoric mood.     Physical Exam Updated Vital Signs BP 129/61   Pulse  (!) 51   Temp 98.4 F (36.9 C) (Oral)   Resp 20   Ht 5\' 11"  (1.803 m)   Wt 114.3 kg Comment: Scale A  SpO2 100%   BMI 35.13 kg/m   Physical Exam  Constitutional: He is oriented to person, place, and time. He appears well-developed and well-nourished.  HENT:  Head: Normocephalic and atraumatic.  Eyes: Pupils are equal, round, and reactive to light. EOM are normal.  Neck: Normal range of motion. Neck supple. No JVD present.  Cardiovascular: Normal rate and regular rhythm. Exam reveals no gallop and no friction rub.  No murmur heard. Pulmonary/Chest: No respiratory distress. He has no wheezes.  Abdominal: He exhibits no distension and no mass. There is no tenderness. There is no rebound and no guarding.  Musculoskeletal: Normal range of motion.  Neurological: He is alert and oriented to person, place, and time.  Skin: No rash noted. No pallor.  Psychiatric: He has a normal mood and affect. His behavior is normal.  Nursing note and vitals reviewed.    ED Treatments / Results  Labs (all labs ordered are listed, but only abnormal results are displayed) Labs Reviewed  BASIC METABOLIC PANEL - Abnormal; Notable for the following components:      Result Value   Chloride 96 (*)    Glucose, Bld 213 (*)    BUN 70 (*)    Creatinine, Ser 15.50 (*)    Calcium 8.8 (*)    GFR calc non Af Amer 3 (*)    GFR calc Af Amer 3 (*)    All other components within normal limits  CBC - Abnormal; Notable for the following components:   RBC 3.52 (*)    Hemoglobin 10.8 (*)    HCT 34.2 (*)    RDW 17.5 (*)    Platelets 66 (*)    All other components within normal limits  TROPONIN I - Abnormal; Notable for the following components:   Troponin I 0.03 (*)    All other components within normal limits  URINALYSIS, ROUTINE W REFLEX MICROSCOPIC - Abnormal; Notable for the following components:   APPearance CLOUDY (*)    pH 9.0 (*)    Hgb urine dipstick MODERATE (*)    Protein, ur 100 (*)  Leukocytes, UA LARGE (*)    Bacteria, UA FEW (*)    All other components within normal limits  TROPONIN I - Abnormal; Notable for the following components:   Troponin I 0.03 (*)    All other components within normal limits  TROPONIN I - Abnormal; Notable for the following components:   Troponin I 0.07 (*)    All other components within normal limits  CREATININE, SERUM - Abnormal; Notable for the following components:   Creatinine, Ser 15.71 (*)    GFR calc non Af Amer 3 (*)    GFR calc Af Amer 3 (*)    All other components within normal limits  CBC - Abnormal; Notable for the following components:   RBC 3.46 (*)    Hemoglobin 10.5 (*)    HCT 32.8 (*)    RDW 17.3 (*)    Platelets 82 (*)    All other components within normal limits  GLUCOSE, CAPILLARY - Abnormal; Notable for the following components:   Glucose-Capillary 176 (*)    All other components within normal limits  GLUCOSE, CAPILLARY - Abnormal; Notable for the following components:   Glucose-Capillary 154 (*)    All other components within normal limits  RENAL FUNCTION PANEL - Abnormal; Notable for the following components:   Chloride 96 (*)    Glucose, Bld 115 (*)    BUN 84 (*)    Creatinine, Ser 16.35 (*)    Calcium 8.8 (*)    Phosphorus 6.2 (*)    Albumin 3.1 (*)    GFR calc non Af Amer 3 (*)    GFR calc Af Amer 3 (*)    Anion gap 17 (*)    All other components within normal limits  GLUCOSE, CAPILLARY - Abnormal; Notable for the following components:   Glucose-Capillary 126 (*)    All other components within normal limits  CBC - Abnormal; Notable for the following components:   RBC 3.25 (*)    Hemoglobin 9.9 (*)    HCT 30.0 (*)    RDW 17.2 (*)    Platelets 92 (*)    All other components within normal limits  RENAL FUNCTION PANEL - Abnormal; Notable for the following components:   Sodium 133 (*)    Chloride 93 (*)    Glucose, Bld 108 (*)    BUN 36 (*)    Creatinine, Ser 9.37 (*)    Calcium 8.0 (*)     Albumin 2.7 (*)    GFR calc non Af Amer 5 (*)    GFR calc Af Amer 6 (*)    All other components within normal limits  GLUCOSE, CAPILLARY - Abnormal; Notable for the following components:   Glucose-Capillary 117 (*)    All other components within normal limits  GLUCOSE, CAPILLARY - Abnormal; Notable for the following components:   Glucose-Capillary 127 (*)    All other components within normal limits  GLUCOSE, CAPILLARY - Abnormal; Notable for the following components:   Glucose-Capillary 130 (*)    All other components within normal limits  I-STAT CHEM 8, ED - Abnormal; Notable for the following components:   BUN 65 (*)    Creatinine, Ser 16.20 (*)    Glucose, Bld 215 (*)    Calcium, Ion 1.02 (*)    Hemoglobin 11.2 (*)    HCT 33.0 (*)    All other components within normal limits  CULTURE, BLOOD (SINGLE)  MRSA PCR SCREENING  VANCOMYCIN, RANDOM  GLUCOSE, CAPILLARY  HEPATITIS B  SURFACE ANTIGEN  I-STAT TROPONIN, ED  I-STAT TROPONIN, ED    EKG EKG Interpretation  Date/Time:  Monday September 10 2018 08:04:59 EDT Ventricular Rate:  66 PR Interval:  184 QRS Duration: 88 QT Interval:  444 QTC Calculation: 466 R Axis:   44 Text Interpretation:  Sinus rhythm No significant change since last tracing Confirmed by Deno Etienne [phone]) on 09/10/2018 3:57:54 PM   Radiology No results found.  Procedures Procedures (including critical care time)  Medications Ordered in ED Medications  alum & mag hydroxide-simeth (MAALOX/MYLANTA) 200-200-20 MG/5ML suspension 15 mL (15 mLs Oral Given 09/10/18 2119)     Initial Impression / Assessment and Plan / ED Course  I have reviewed the triage vital signs and the nursing notes.  Pertinent labs & imaging results that were available during my care of the patient were reviewed by me and considered in my medical decision making (see chart for details).     67 yo M with a chief complaint of chest pain.  This somewhat atypical in nature.  His  EKG with no concerning changes. Will obtain labs, cxr.   Initial trop negative.  Delta trop negative but the number went from 0.04-0.07.  Will obtain a third troponin.  Third trop was initially lost in the lab and there was a machinery area which led to a delay.  The third troponin was 0.03 which is at the lowest limit positive.  Patient continues to be asymptomatic.   Patient feeling lightheaded while walking.  Discussed with fam med for admission.   CRITICAL CARE Performed by: Cecilio Asper   Total critical care time: 35 minutes  Critical care time was exclusive of separately billable procedures and treating other patients.  Critical care was necessary to treat or prevent imminent or life-threatening deterioration.  Critical care was time spent personally by me on the following activities: development of treatment plan with patient and/or surrogate as well as nursing, discussions with consultants, evaluation of patient's response to treatment, examination of patient, obtaining history from patient or surrogate, ordering and performing treatments and interventions, ordering and review of laboratory studies, ordering and review of radiographic studies, pulse oximetry and re-evaluation of patient's condition.  The patients results and plan were reviewed and discussed.   Any x-rays performed were independently reviewed by myself.   Differential diagnosis were considered with the presenting HPI.  Medications  alum & mag hydroxide-simeth (MAALOX/MYLANTA) 200-200-20 MG/5ML suspension 15 mL (15 mLs Oral Given 09/10/18 0838)    Vitals:   09/12/18 0500 09/12/18 0502 09/12/18 0848 09/12/18 1213  BP:  (!) 134/59 135/61 129/61  Pulse:  (!) 55 (!) 58 (!) 51  Resp:  20  20  Temp:  97.9 F (36.6 C)  98.4 F (36.9 C)  TempSrc:  Oral  Oral  SpO2:  100% 100% 100%  Weight: 114.3 kg     Height:        Final diagnoses:  Atypical chest pain  Fever in adult    Admission/ observation  were discussed with the admitting physician, patient and/or family and they are comfortable with the plan.   Final Clinical Impressions(s) / ED Diagnoses   Final diagnoses:  Atypical chest pain  Fever in adult    ED Discharge Orders         Ordered    calcitRIOL (ROCALTROL) 0.5 MCG capsule  Every T-Th-Sa (Hemodialysis)    Note to Pharmacy:  Gets with dialysis   09/12/18 1353    cinacalcet (SENSIPAR)  30 MG tablet  Every T-Th-Sa (1800)    Note to Pharmacy:  Gets with dialysis   09/12/18 1353    multivitamin (RENA-VIT) TABS tablet  Daily at bedtime     09/12/18 1353    Increase activity slowly     09/12/18 1353    Diet - low sodium heart healthy     09/12/18 [place], Montavis Schubring, DO 09/13/18 1611

## 2018-09-10 NOTE — H&P (Addendum)
[facility] Admission History and Physical Service Pager: [phone]  Patient name: [name] Medical record number: [id number] Date of birth: Apr 23, 1951 Age: 67 y.o. Gender: male  Primary Care Provider: Richarda Osmond, DO Consultants: Cards, Nephro, WOC Code Status: Full  Chief Complaint: Chest pressure  Assessment and Plan: [name] is a 67 y.o. male presenting with lightheadedness and chest pain. PMH is significant for ESRD on HD TTS, CHF, HTN, HLD, paroxysmal A.fib, Anemia of chronic disease, Venous stasis ulcers, T2DM.  Chest Pain: Patient presenting with 2 days of left-sided chest pressure that improves with leaning backwards and worsens with leaning left. The patient reports this pain started while in hemodialysis on Saturday, 9/28. Differential diagnosis most importantly includes ACS given patient's multiple risk factors and istat Troponin 0.04>0.07 with repeat trop I 0.03, however EKG without ST changes. Other causes of chest pain include GERD, costochondritis, and pleurisy due to positional changes of pain. CXR shows chronic bronchitic-reactive airway changes without pneumonia or pleural edema, does note stable left basilar scarring. - Admit to FPTS, attending Dr. Owens Shark - Cardiac monitoring  - Continuous Pulse Ox - Trend troponins - Tylenol PRN Pain - Up with assistance - AM EKG, BMP, CBC - Vitals - PT/OT - echo  Lightheadedness and h/o recent fever : Patient reports having lightheadedness that began at the same time of the chest pain on Saturday 9/28. At the same time he began to feel lightheaded, and it was noted that he had a fever of 100-and-something. He is afebrile here, no leukocytosis. Did have some viral URI like symptoms recently as well. No head imaging this admission. Symptoms most likely due to labyrinthitis secondary to recent illness/fever.  Differential diagnosis includes cardiac insufficiency, vertigo, and  labyrinthitis from recent-onset upper respiratory symptoms. -Monitor -Discussed with nephrology who per review of [facility] outside records, patient received vanc/ceftaz on 9/28 with HD - skin exam to evaluate for possible cellulitis under unna boots, per RN needs WOC consulted which is ordered - Tylenol PRN Fever - monitor off ABX, if spikes fever would obtain BCx and start vanc/cefepime   ESRD with AKI: Followed by [facility]. BUN 70 (BL~50), GFR 3, and SCr at 15.71 (BL ~13). Receives dialysis TTS through [facility].  Most recent HD on 9/28, in which patient developed a fever and was given 2g of vancomycin and 2g ceftazadine.  Patient continues to make urine about 4 times daily. - Nephro consulted for HD 10/1 - appreciate recs  T2DM: A1c 6.2% July 2019. Blood sugar on admission 213. - sSSI - CBG checks  Thrombocytopenia, chronic, stable: Platelets at 66 admission, improved from level of 42 at last admission. Patient has a history of thrombocytopenia noted as early 2016.  He has been worked up in the past without significant findings. Possible etiologies include: ESRD, DIC, TTP, and MGUS conversion to multiple myeloma. Patient asymptomatic at this time. - Monitor  HTN: BPs since admission 125/58 - 175/67.  Patient's home blood pressure medications include amlodipine 10 mg daily, carvedilol 6.25 mg twice daily and Lasix 80 mg twice daily.   - Continue home meds  CHF: Most recent Echo done 03/24/2017 showed EF 55-60% and G2DD. Home medication includes lasix, carvedilol 6.25 mg BID. - Continue home medication  History of paroxysmal A-fib: Patient had an episode of A. fib while sick with sepsis at previous hospitalization.  The issue resolved he was not sent home on anticoagulation.  EKG in ED without signs of  A. fib.  - No treatment at this time - Continue to monitor  Venous Insufficiency: Patient has a history of significant venous insufficiency. Bilateral lower  extremities currently wrapped in Unna boots. - Wound care consult - F/u podiatry outpatient  MGUS, chronic, stable: Noted in 06/2014. Patient with no red flag symptoms on exam and clinically appearing well overall - If weakness/fatigue continues without reason may need f/u  FEN/GI: Renal/carb modified diet, Maalox, MiraLAX, Prophylaxis: Lovenox  Disposition: Admit to telemetry  History of Present Illness:  [name] is a 67 y.o. male presenting with lightheadedness and chest pressure that began Saturday 9/28 while the patient was in dialysis.  He states he was sitting in his chair in dialysis and there was an air event blowing cold air on him.  He states he felt cold at the time, but staff took his temperature and it was 100-and-something.  For this he was given an antibiotic.  The patient states he also has a left-sided, nonradiating chest pressure.  He states the pressure is worse with leaning left but better with leaning backwards.  In the ED the pain is currently gone but he still feels lightheaded and dizzy.  He denies a history of vertigo.  The patient reports having a stuffy, runny nose since Saturday.   Review Of Systems: Per HPI with the following additions:  Review of Systems  Constitutional: Positive for chills and fever. Negative for diaphoresis and malaise/fatigue.  HENT: Positive for congestion. Negative for sinus pain and sore throat.   Respiratory: Negative for cough and shortness of breath.   Cardiovascular: Positive for chest pain.  Gastrointestinal: Negative for abdominal pain, blood in stool, constipation, diarrhea, melena, nausea and vomiting.  Genitourinary: Positive for frequency. Negative for dysuria, hematuria and urgency.  Neurological: Positive for dizziness. Negative for loss of consciousness, weakness and headaches.       Lightheadness    Patient Active Problem List   Diagnosis Date Noted  . Hypovolemia associated with hemodialysis 07/11/2018  .  Weakness   . Thrombocytopenia ([place]) 07/10/2018  . Paroxysmal atrial fibrillation ([place]) 05/26/2017  . Balanitis 04/25/2017  . Penile swelling   . Scrotal swelling   . UTI (urinary tract infection) 04/13/2017  . Anemia of chronic disease   . Acute encephalopathy 04/12/2017  . Urinary retention 04/10/2017  . Bacteremia   . RUQ abdominal pain   . Gross hematuria   . Other pancytopenia ([place])   . Sepsis ([place]) 03/21/2017  . Diarrhea 03/03/2017  . Venous stasis ulcers of both lower extremities ([place]) 08/07/2015  . Varicose veins of lower extremities with complications 87/86/7672  . Malfunction of arteriovenous dialysis fistula ([place])   . CHF (congestive heart failure) ([place]) 06/23/2015  . Lymphedema distichiasis syndrome with kidney disease and diabetes mellitus ([place]) 04/06/2015  . Lymphedema of lower extremity 04/04/2015  . DM (diabetes mellitus), type 2 with renal complications ([place]) 09/47/0962  . MGUS (monoclonal gammopathy of unknown significance) 06/12/2014  . Special screening for malignant neoplasms, colon 07/09/2013  . Proliferative diabetic retinopathy ([place]) 10/23/2012  . Knee pain 08/21/2012  . Mass of thigh 08/21/2012  . FEVER UNSPECIFIED 05/06/2010  . ORGANIC IMPOTENCE 12/02/2009  . ONYCHOMYCOSIS 07/17/2009  . Lymphedema 05/06/2009  . ESRD (end stage renal disease) ([place]) 06/09/2008  . Hyperlipidemia 03/19/2007  . OBESITY, MORBID 02/08/2007  . Essential hypertension 02/08/2007  . Atherosclerosis of native arteries of extremity with intermittent claudication ([place]) 02/08/2007    Past Medical History: Past Medical History:  Diagnosis  Date  . Arthritis   . Asthma    as a child  . Chronic cystitis   . Chronic diastolic heart failure ([place])   . Chronic kidney disease    HD pt, 3 times a week.  . Diabetes mellitus   . Dysrhythmia   . GERD (gastroesophageal reflux disease)   . H/O hiatal hernia    states it's been fixed  . Hyperlipidemia   . Hypertension   . Lymphedema    . Morbid obesity ([place])   . NEPHROLITHIASIS, HX OF 12/02/2009   Qualifier: Diagnosis of  By: [name], Cat    . PVD (peripheral vascular disease) ([place])   . Renal insufficiency   . UMBILICAL HERNIA 03/30/3789   Qualifier: History of  By: [name] MD, Audelia Acton    . Venous insufficiency     Past Surgical History: Past Surgical History:  Procedure Laterality Date  . ABDOMINAL AORTOGRAM W/LOWER EXTREMITY N/A 05/19/2017   Procedure: Abdominal Aortogram w/Lower Extremity;  Surgeon: [name], MD;  Location: [facility];  Service: Cardiovascular;  Laterality: N/A;  . AMPUTATION     Right and left fifth toes.   . AMPUTATION TOE     emoval of both little toes  . AV FISTULA PLACEMENT Left 03/21/2014   Procedure: ARTERIOVENOUS (AV) FISTULA CREATION with ultrasound;  Surgeon: [name], MD;  Location: [facility];  Service: Vascular;  Laterality: Left;  . COLONOSCOPY    . EYE SURGERY Bilateral    cataract and lens implant  . HERNIA REPAIR    . LIGATION OF COMPETING BRANCHES OF ARTERIOVENOUS FISTULA Left 06/29/2015   Procedure: LIGATION OF LEFT ARM RADIOCEPHALIC ARTERIOVENOUS FISTULA SIDE BRANCHES;  Surgeon: Conrad Suitland, MD;  Location: [facility];  Service: Vascular;  Laterality: Left;  [name] MULTIPLE EXTRACTIONS WITH ALVEOLOPLASTY N/A 04/07/2017   Procedure: Extraction of tooth #'s 1-11, 13, 14,16, 20-23, and 26-28 with alveoloplasty;  Surgeon: Lenn Cal, DDS;  Location: [facility];  Service: Oral Surgery;  Laterality: N/A;  . Popliteal to posterior tibial bypass     2006  . R knee arthoscopic repair of meniscus    . UMBILICAL HERNIA REPAIR      Social History: Social History   Tobacco Use  . Smoking status: Former Smoker    Last attempt to quit: 06/06/1979    Years since quitting: 39.2  . Smokeless tobacco: Former Network engineer Use Topics  . Alcohol use: No    Alcohol/week: 0.0 standard drinks    Comment:   "years ago" , none now  . Drug use: No   Additional social history: Lives at  home alone.   Please also refer to relevant sections of EMR.  Family History: Family History  Problem Relation Age of Onset  . Diabetes Mother   . Heart disease Mother   . Diabetes Brother     Allergies and Medications: Allergies  Allergen Reactions  . Shrimp [Shellfish Allergy] Swelling   Current Facility-Administered Medications on File Prior to Encounter  Medication Dose Route Frequency Provider Last Rate Last Dose  . 0.9 %  sodium chloride infusion  500 mL Intravenous Continuous Danis, Estill Cotta III, MD      . hydrALAZINE (APRESOLINE) injection 10 mg  10 mg Intravenous Once [name], MD       Current Outpatient Medications on File Prior to Encounter  Medication Sig Dispense Refill  . acetaminophen (TYLENOL) 500 MG tablet Take 1,000 mg by mouth daily as  needed for moderate pain or headache.    [name] amLODipine (NORVASC) 10 MG tablet Take 10 mg by mouth at bedtime.     . carvedilol (COREG) 6.25 MG tablet take 1 tablet by mouth twice a day with meals (Patient taking differently: Take 6.25 mg by mouth 2 (two) times daily with a meal. ) 180 tablet 3  . furosemide (LASIX) 80 MG tablet Take 1 tablet (80 mg total) by mouth 2 (two) times daily. 60 tablet 2  . clotrimazole (LOTRIMIN) 1 % cream Apply 1 application topically 2 (two) times daily. (Patient not taking: Reported on 07/10/2018) 30 g 0  . predniSONE (DELTASONE) 20 MG tablet Take 20 mg by mouth daily.    . urea 10 % lotion Apply topically 2 (two) times daily. (Patient not taking: Reported on 07/10/2018) 237 mL 0   Objective: BP (!) 164/65   Pulse (!) 50   Temp 97.8 F (36.6 C) (Oral)   Resp 20   SpO2 100%   Physical Exam  Constitutional: He is oriented to person, place, and time and well-developed, well-nourished, and in no distress. No distress.  HENT:  Head: Normocephalic and atraumatic.  Eyes: EOM are normal.  Neck: Normal range of motion.  Cardiovascular: Normal rate and regular rhythm. palpable thrill L  UE. Pulmonary/Chest: Effort normal and breath sounds normal.  Abdominal: Soft. He exhibits no distension.  Musculoskeletal: Unna boots bilaterally, can see signs of venous stasis proximal to boots. Neurological: He is alert and oriented to person, place, and time.  Skin: Skin is warm and dry.   Labs and Imaging: CBC BMET  Recent Labs  Lab 09/10/18 0723 09/10/18 0743  WBC 5.7  --   HGB 10.8* 11.2*  HCT 34.2* 33.0*  PLT 66*  --    Recent Labs  Lab 09/10/18 0723 09/10/18 0743  NA 138 138  K 4.3 4.2  CL 96* 99  CO2 27  --   BUN 70* 65*  CREATININE 15.50* 16.20*  GLUCOSE 213* 215*  CALCIUM 8.8*  --       Dg Chest 2 View  Result Date: 09/10/2018 CLINICAL DATA:  Onset of left chest pressure and lightheadedness on Saturday while at dialysis which has persisted. No shortness of breath or cough. EXAM: CHEST - 2 VIEW COMPARISON:  PA and lateral chest x-ray of July 10, 2018 FINDINGS: The lungs are well-expanded. There is no focal infiltrate. There is left basilar scarring versus diaphragmatic calcification which is stable. The heart and pulmonary vascularity are normal. There is calcification in the wall of the aortic arch. The trachea is midline. The bony thorax exhibits no acute abnormality. IMPRESSION: Chronic bronchitic-reactive airway changes. No acute pneumonia nor pulmonary edema. Stable left basilar scarring. Thoracic aortic atherosclerosis. Electronically Signed   By: David  Martinique M.D.   On: 09/10/2018 08:32   Daisy Floro, DO 09/10/2018, 4:43 PM PGY-1, [place] Intern pager: [phone], text pages welcome  FPTS Upper-Level Resident Addendum  I have independently interviewed and examined the patient. I have discussed the above with the original author and agree with their documentation. My edits for correction/addition/clarification are in blue. Please see also any attending notes.   Bufford Lope, DO PGY-3, [facility] 09/10/2018  8:09 PM  FPTS Service pager: [phone] (text pages welcome through [place])

## 2018-09-11 ENCOUNTER — Inpatient Hospital Stay (HOSPITAL_COMMUNITY): Payer: Medicare Other

## 2018-09-11 DIAGNOSIS — R001 Bradycardia, unspecified: Secondary | ICD-10-CM

## 2018-09-11 DIAGNOSIS — R079 Chest pain, unspecified: Secondary | ICD-10-CM

## 2018-09-11 DIAGNOSIS — I878 Other specified disorders of veins: Secondary | ICD-10-CM

## 2018-09-11 DIAGNOSIS — I340 Nonrheumatic mitral (valve) insufficiency: Secondary | ICD-10-CM | POA: Diagnosis not present

## 2018-09-11 DIAGNOSIS — N186 End stage renal disease: Secondary | ICD-10-CM

## 2018-09-11 DIAGNOSIS — R509 Fever, unspecified: Secondary | ICD-10-CM

## 2018-09-11 DIAGNOSIS — Z8679 Personal history of other diseases of the circulatory system: Secondary | ICD-10-CM

## 2018-09-11 DIAGNOSIS — R7989 Other specified abnormal findings of blood chemistry: Secondary | ICD-10-CM

## 2018-09-11 DIAGNOSIS — D696 Thrombocytopenia, unspecified: Secondary | ICD-10-CM

## 2018-09-11 DIAGNOSIS — E1121 Type 2 diabetes mellitus with diabetic nephropathy: Secondary | ICD-10-CM | POA: Diagnosis not present

## 2018-09-11 DIAGNOSIS — I951 Orthostatic hypotension: Secondary | ICD-10-CM

## 2018-09-11 DIAGNOSIS — D472 Monoclonal gammopathy: Secondary | ICD-10-CM

## 2018-09-11 DIAGNOSIS — I132 Hypertensive heart and chronic kidney disease with heart failure and with stage 5 chronic kidney disease, or end stage renal disease: Secondary | ICD-10-CM | POA: Diagnosis not present

## 2018-09-11 DIAGNOSIS — Z6835 Body mass index (BMI) 35.0-35.9, adult: Secondary | ICD-10-CM

## 2018-09-11 DIAGNOSIS — Z992 Dependence on renal dialysis: Secondary | ICD-10-CM

## 2018-09-11 DIAGNOSIS — R071 Chest pain on breathing: Secondary | ICD-10-CM

## 2018-09-11 DIAGNOSIS — E1122 Type 2 diabetes mellitus with diabetic chronic kidney disease: Secondary | ICD-10-CM | POA: Diagnosis not present

## 2018-09-11 DIAGNOSIS — I5032 Chronic diastolic (congestive) heart failure: Secondary | ICD-10-CM | POA: Diagnosis not present

## 2018-09-11 DIAGNOSIS — I959 Hypotension, unspecified: Secondary | ICD-10-CM

## 2018-09-11 LAB — RENAL FUNCTION PANEL
ANION GAP: 17 — AB (ref 5–15)
Albumin: 3.1 g/dL — ABNORMAL LOW (ref 3.5–5.0)
BUN: 84 mg/dL — ABNORMAL HIGH (ref 8–23)
CHLORIDE: 96 mmol/L — AB (ref 98–111)
CO2: 25 mmol/L (ref 22–32)
CREATININE: 16.35 mg/dL — AB (ref 0.61–1.24)
Calcium: 8.8 mg/dL — ABNORMAL LOW (ref 8.9–10.3)
GFR, EST AFRICAN AMERICAN: 3 mL/min — AB (ref >60)
GFR, EST NON AFRICAN AMERICAN: 3 mL/min — AB (ref >60)
Glucose, Bld: 115 mg/dL — ABNORMAL HIGH (ref 70–99)
Phosphorus: 6.2 mg/dL — ABNORMAL HIGH (ref 2.5–4.6)
Potassium: 4.3 mmol/L (ref 3.5–5.1)
SODIUM: 138 mmol/L (ref 135–145)

## 2018-09-11 LAB — CBC
HEMATOCRIT: 32.8 % — AB (ref 39.0–52.0)
Hemoglobin: 10.5 g/dL — ABNORMAL LOW (ref 13.0–17.0)
MCH: 30.3 pg (ref 26.0–34.0)
MCHC: 32 g/dL (ref 30.0–36.0)
MCV: 94.8 fL (ref 78.0–100.0)
Platelets: 82 10*3/uL — ABNORMAL LOW (ref 150–400)
RBC: 3.46 MIL/uL — ABNORMAL LOW (ref 4.22–5.81)
RDW: 17.3 % — AB (ref 11.5–15.5)
WBC: 8.1 10*3/uL (ref 4.0–10.5)

## 2018-09-11 LAB — HEPATITIS B SURFACE ANTIGEN: HEP B S AG: NEGATIVE

## 2018-09-11 LAB — ECHOCARDIOGRAM COMPLETE
Height: 71 in
Weight: 4059.2 oz

## 2018-09-11 LAB — GLUCOSE, CAPILLARY
GLUCOSE-CAPILLARY: 126 mg/dL — AB (ref 70–99)
Glucose-Capillary: 96 mg/dL (ref 70–99)
Glucose-Capillary: 117 mg/dL — ABNORMAL HIGH (ref 70–99)

## 2018-09-11 LAB — VANCOMYCIN, RANDOM: VANCOMYCIN RM: 13

## 2018-09-11 LAB — TROPONIN I: TROPONIN I: 0.07 ng/mL — AB (ref <0.03)

## 2018-09-11 LAB — MRSA PCR SCREENING: MRSA by PCR: NEGATIVE

## 2018-09-11 MED ORDER — PENTAFLUOROPROP-TETRAFLUOROETH EX AERO: 1.0000 "application " | INHALATION_SPRAY | CUTANEOUS | Status: DC | PRN

## 2018-09-11 MED ORDER — RENA-VITE PO TABS
1.0000 | ORAL_TABLET | Freq: Every day | ORAL | Status: DC
  Administered 2018-09-11: 1 via ORAL
  Filled 2018-09-11: qty 1

## 2018-09-11 MED ORDER — LIDOCAINE-PRILOCAINE 2.5-2.5 % EX CREA: 1.0000 "application " | TOPICAL_CREAM | CUTANEOUS | Status: DC | PRN

## 2018-09-11 MED ORDER — SODIUM CHLORIDE 0.9 % IV SOLN: 100.0000 mL | INTRAVENOUS | Status: DC | PRN

## 2018-09-11 MED ORDER — CINACALCET HCL 30 MG PO TABS
60.0000 mg | ORAL_TABLET | ORAL | Status: DC
  Administered 2018-09-11: 60 mg via ORAL
  Filled 2018-09-11: qty 2

## 2018-09-11 MED ORDER — LIDOCAINE HCL (PF) 1 % IJ SOLN: 5.0000 mL | INTRAMUSCULAR | Status: DC | PRN

## 2018-09-11 MED ORDER — CALCITRIOL 0.5 MCG PO CAPS: 1.5000 ug | ORAL_CAPSULE | ORAL | Status: DC

## 2018-09-11 MED ORDER — HEPARIN SODIUM (PORCINE) 1000 UNIT/ML DIALYSIS: 1000.0000 [IU] | INTRAMUSCULAR | Status: DC | PRN

## 2018-09-11 MED ORDER — CHLORHEXIDINE GLUCONATE CLOTH 2 % EX PADS: 6.0000 | MEDICATED_PAD | Freq: Every day | CUTANEOUS | Status: DC

## 2018-09-11 MED ORDER — SUCROFERRIC OXYHYDROXIDE 500 MG PO CHEW
1000.0000 mg | CHEWABLE_TABLET | Freq: Three times a day (TID) | ORAL | Status: DC
  Administered 2018-09-11 – 2018-09-12 (×3): 1000 mg via ORAL
  Filled 2018-09-11 (×4): qty 2

## 2018-09-11 NOTE — Progress Notes (Signed)
  Echocardiogram 2D Echocardiogram has been performed.  Tandra Rosado G Jos Cygan 09/11/2018, 10:39 AM

## 2018-09-11 NOTE — Plan of Care (Signed)

## 2018-09-11 NOTE — Consult Note (Signed)
[facility] Nurse wound consult note Attempted to evaluate patient for [place] consult.  Patient in [facility] receiving an echocardiogram.  Will return later today. Val Riles, RN, MSN, CWOCN, CNS-BC, pager [phone]

## 2018-09-11 NOTE — Progress Notes (Addendum)
Family Medicine Teaching Service Daily Progress Note Intern Pager: [phone]  Patient name: [name] Medical record number: [id number] Date of birth: 1951/08/22 Age: 67 y.o. Gender: male  Primary Care Provider: Richarda Osmond, DO Consultants: cardiology, nephrology Code Status: Full  Pt Overview and Major Events to Date:  09/10/18 admitted with chest pain  Assessment and Plan: [name] is a 67 y.o. male who presented with lightheadedness and chest pain. PMH is significant for ESRD on HD TTS, CHF, HTN, HLD, paroxysmal A.fib, Anemia of chronic disease, Venous stasis ulcers, T2DM.  Chest Pain, resolved: Likely atypical chest pain. Trops overnight 0.03, 0.03, 0.07 and EKG showing sinus bradycardia this morning without ST changes.  - monitoring on telemetry - cards consulted in ED, confirmed that will see this morning, appreciate recommendations - echo - holding home coreg for bradycardia  Lightheadedness and h/o recent fever : Uncertain etiology as no outside records available for review, hopeful that nephrology can shed some light on the reported fever at outpt HD on 09/08/18 that required vanc/ceftaz that day. Patient is afebrile and WBC 8.1 WNL today. BP is elevated so no hypotension but is bradycardic which could explain the lightheadedness. Skin exam without a source of infection.  - monitor off ABX, if spikes fever would obtain BCx and start vanc/cefepime - follow up on outpt cultures from HD, appreciate nephro assistance - orthostatic vital signs  ESRD on TTS, stable. Patient reports a fistulogram at [facility] scheduled for tomorrow 09/12/18 - Nephro consulted, appreciate recs - consider reschedule fistulogram  T2DM, stable. A1c 6.2% July 2019 - sSSI - monitor CBGs  Thrombocytopenia, improved: Likely related to MGUS. Plts 66>82 which upon further chart review is lower than baseline. He did have similar platelet counts in April 2018 but most recently as had  normal values.  - Monitor Plts  - Follow up as outpatient  HTN:BPs elevated to 160/71 today, anticipate improvement with regularly scheduled HD today  - Continue home norvasc - holding home coreg as per above - monitor BPs post HD  HFpEF, stable.  Most recent Echo done 03/24/2017 showed EF 55-60% and G2DD. Home medication includes lasix,carvedilol 6.25 mg BID. - Continue home norvasc  Venous Insufficiency, stable. - Wound care consult - F/u podiatry outpatient  MGUS, chronic, stable: - follow up as outpatient  FEN/GI: Renal/carb modified diet, Maalox, MiraLAX, Prophylaxis: Lovenox  Disposition: pending medical work up and PT/OT eval  Subjective:  Patient states he feels well this morning. No further episode of chest pain even with leaning L. No fever/chills overnight. He is concerned regarding his scheduled fistulogram tomorrow at [facility] and wonders if it needs to be rescheduled.   Objective: Temp:  [97.5 F (36.4 C)-98.1 F (36.7 C)] 97.5 F (36.4 C) (10/01 0545) Pulse Rate:  [49-62] 50 (10/01 0545) Resp:  [17-20] 20 (10/01 0545) BP: (133-175)/(55-83) 160/71 (10/01 0545) SpO2:  [98 %-100 %] 98 % (10/01 0545) Weight:  [115.1 kg-115.7 kg] 115.1 kg (10/01 0545) Physical Exam: General: sitting up in chair, in NAD Cardiovascular: regular, normal S1 and S2. No rubs or gallops Respiratory: CTAB, normal work of breathing on room air Abdomen: soft, nontender, nondistended, + bowel sounds Extremities: bilateral LE with 2+ pitting edema and chronic scaly dry skin changes without erythema. R plantar surface 5th metacarpal head area with a nonerythematous callus without drainage.  Laboratory: Recent Labs  Lab 09/10/18 0723 09/10/18 0743 09/11/18 0337  WBC 5.7  --  8.1  HGB 10.8* 11.2* 10.5*  HCT 34.2* 33.0* 32.8*  PLT 66*  --  82*   Recent Labs  Lab 09/10/18 0723 09/10/18 0743 09/10/18 1657  NA 138 138  --   K 4.3 4.2  --   CL 96* 99  --   CO2 27  --    --   BUN 70* 65*  --   CREATININE 15.50* 16.20* 15.71*  CALCIUM 8.8*  --   --   GLUCOSE 213* 215*  --      Trop 0.03 , 0.03, 0.07  Urinalysis    Component Value Date/Time   COLORURINE YELLOW 09/10/2018 1828   APPEARANCEUR CLOUDY (A) 09/10/2018 1828   LABSPEC 1.010 09/10/2018 1828   PHURINE 9.0 (H) 09/10/2018 1828   GLUCOSEU NEGATIVE 09/10/2018 1828   HGBUR MODERATE (A) 09/10/2018 1828   HGBUR negative 04/13/2007 0821   BILIRUBINUR NEGATIVE 09/10/2018 1828   BILIRUBINUR NEG 08/21/2015 1050   KETONESUR NEGATIVE 09/10/2018 1828   PROTEINUR 100 (A) 09/10/2018 1828   UROBILINOGEN 0.2 08/21/2015 1050   UROBILINOGEN 0.2 03/07/2014 1153   NITRITE NEGATIVE 09/10/2018 1828   LEUKOCYTESUR LARGE (A) 09/10/2018 1828     Imaging/Diagnostic Tests: Dg Chest 2 View  Result Date: 09/10/2018 CLINICAL DATA:  Onset of left chest pressure and lightheadedness on Saturday while at dialysis which has persisted. No shortness of breath or cough. EXAM: CHEST - 2 VIEW COMPARISON:  PA and lateral chest x-ray of July 10, 2018 FINDINGS: The lungs are well-expanded. There is no focal infiltrate. There is left basilar scarring versus diaphragmatic calcification which is stable. The heart and pulmonary vascularity are normal. There is calcification in the wall of the aortic arch. The trachea is midline. The bony thorax exhibits no acute abnormality. IMPRESSION: Chronic bronchitic-reactive airway changes. No acute pneumonia nor pulmonary edema. Stable left basilar scarring. Thoracic aortic atherosclerosis. Electronically Signed   By: David  Martinique M.D.   On: 09/10/2018 08:32     Bufford Lope, DO 09/11/2018, 8:11 AM PGY-3, [place] Intern pager: [phone], text pages welcome

## 2018-09-11 NOTE — Evaluation (Signed)
Physical Therapy Evaluation Patient Details Name: [name] MRN: [id number] DOB: 11-14-1951 Today's Date: 09/11/2018   History of Present Illness  67 yo male here for chest pain. PMHx: ESRD on HD, DM II, CHF, HTN, HLD, Afib, anemia, VS ulcers, MGUS, diabetic retinopathy, obesity.   Clinical Impression  Pt pleasant on arrival, saying it feels good to get out of bed. Pt able to ambulate 359ft with cane and min guard. A few LOB episodes "miss-steps" that he reports is baseline. Pt able to self-correct and states he has not fallen. Pt with decreased strength, conditioning, and balance (see PT problem list below). Pt will benefit acutely from therapy to maximize functional mobility, independence and safety to decrease fall risk.     Follow Up Recommendations Outpatient PT    Equipment Recommendations  Rolling walker with 5" wheels    Recommendations for Other Services       Precautions / Restrictions Precautions Precautions: Fall      Mobility  Bed Mobility               General bed mobility comments: in chair on arrival  Transfers Overall transfer level: Modified independent               General transfer comment: 2 trials from chair to stand  Ambulation/Gait Ambulation/Gait assistance: Min guard Gait Distance (Feet): 350 Feet Assistive device: Straight cane Gait Pattern/deviations: Step-through pattern;Decreased stride length;Wide base of support   Gait velocity interpretation: >2.62 ft/sec, indicative of community ambulatory General Gait Details: increased sway with gait with wide BOS, partial LOB with initiation of gait but pt able to self correct. HR 54-72 with gait  Stairs            Wheelchair Mobility    Modified Rankin (Stroke Patients Only)       Balance Overall balance assessment: Mild deficits observed, not formally tested                                           Pertinent Vitals/Pain Pain Assessment: No/denies  pain    Home Living Family/patient expects to be discharged to:: Private residence Living Arrangements: Alone Available Help at Discharge: Available PRN/intermittently;Friend(s) Type of Home: Apartment Home Access: Elevator     Home Layout: One level Home Equipment: Cane - single point      Prior Function Level of Independence: Independent with assistive device(s)               Hand Dominance        Extremity/Trunk Assessment   Upper Extremity Assessment Upper Extremity Assessment: Overall WFL for tasks assessed    Lower Extremity Assessment Lower Extremity Assessment: Overall WFL for tasks assessed    Cervical / Trunk Assessment Cervical / Trunk Assessment: Normal  Communication   Communication: No difficulties  Cognition Arousal/Alertness: Awake/alert Behavior During Therapy: WFL for tasks assessed/performed Overall Cognitive Status: Within Functional Limits for tasks assessed                                        General Comments General comments (skin integrity, edema, etc.): bil LE skin cracking/peeling     Exercises     Assessment/Plan    PT Assessment Patient needs continued PT services  PT Problem List Decreased strength;Decreased mobility;Decreased safety  awareness;Decreased activity tolerance;Decreased balance;Decreased cognition       PT Treatment Interventions Gait training;Therapeutic activities;Therapeutic exercise;Functional mobility training;Balance training;DME instruction;Patient/family education    PT Goals (Current goals can be found in the Care Plan section)  Acute Rehab PT Goals Patient Stated Goal: play chess PT Goal Formulation: With patient Time For Goal Achievement: 09/25/18 Potential to Achieve Goals: Good    Frequency Min 3X/week   Barriers to discharge        Co-evaluation               AM-PAC PT "6 Clicks" Daily Activity  Outcome Measure Difficulty turning over in bed (including  adjusting bedclothes, sheets and blankets)?: None Difficulty moving from lying on back to sitting on the side of the bed? : None Difficulty sitting down on and standing up from a chair with arms (e.g., wheelchair, bedside commode, etc,.)?: None Help needed moving to and from a bed to chair (including a wheelchair)?: A Little Help needed walking in hospital room?: A Little Help needed climbing 3-5 steps with a railing? : A Lot 6 Click Score: 20    End of Session Equipment Utilized During Treatment: Gait belt Activity Tolerance: Patient tolerated treatment well Patient left: in bed;with call bell/phone within reach Nurse Communication: Mobility status PT Visit Diagnosis: Other abnormalities of gait and mobility (R26.89);Unsteadiness on feet (R26.81)    Time: 2563-8937 PT Time Calculation (min) (ACUTE ONLY): 27 min   Charges:   PT Evaluation $PT Eval Moderate Complexity: 1 Mod PT Treatments $Gait Training: 8-22 mins        Samuella Bruin, [place]  Acute Rehab [phone]   Samuella Bruin 09/11/2018, 10:48 AM

## 2018-09-11 NOTE — Progress Notes (Signed)
Orthopedic Tech Progress Note Patient Details:  ROMA BIERLEIN [birth year] [id number]  Ortho Devices Type of Ortho Device: Louretta Parma boot Ortho Device/Splint Location: bi-lateral. RN held legs for me. Ortho Device/Splint Interventions: Ordered, Adjustment   Post Interventions Patient Tolerated: Well Instructions Provided: Care of device, Adjustment of device   Karolee Stamps 09/11/2018, 7:52 PM

## 2018-09-11 NOTE — Evaluation (Signed)
Occupational Therapy Evaluation Patient Details Name: [name] MRN: [id number] DOB: May 28, 1951 Today's Date: 09/11/2018    History of Present Illness 67 yo male here for chest pain. PMHx: ESRD on HD, DM II, CHF, HTN, HLD, Afib, anemia, VS ulcers, MGUS, diabetic retinopathy, obesity.    Clinical Impression   Patient evaluated by Occupational Therapy with no further acute OT needs identified. All education has been completed and the patient has no further questions. Pt is able to perform ADLs at mod I level, and feels he is getting close to his baseline (feels slightly weaker).   See below for any follow-up Occupational Therapy or equipment needs. OT is signing off. Thank you for this referral.      Follow Up Recommendations  No OT follow up    Equipment Recommendations  None recommended by OT    Recommendations for Other Services       Precautions / Restrictions Precautions Precautions: Fall Restrictions Weight Bearing Restrictions: No      Mobility Bed Mobility Overal bed mobility: Independent             General bed mobility comments: in chair on arrival  Transfers Overall transfer level: Modified independent               General transfer comment: 2 trials from chair to stand    Balance Overall balance assessment: Mild deficits observed, not formally tested                                         ADL either performed or assessed with clinical judgement   ADL Overall ADL's : Modified independent                                 Tub/ Shower Transfer: Tub transfer;Ambulation;Grab bars;Modified independent(SPC ) Tub/Shower Transfer Details (indicate cue type and reason): simulated tub transfer with supervision using UE support  Functional mobility during ADLs: Modified independent(SPC) General ADL Comments: Pt demonstrates good safety awareness      Vision Patient Visual Report: No change from baseline        Perception     Praxis      Pertinent Vitals/Pain Pain Assessment: No/denies pain     Hand Dominance     Extremity/Trunk Assessment Upper Extremity Assessment Upper Extremity Assessment: Overall WFL for tasks assessed   Lower Extremity Assessment Lower Extremity Assessment: Defer to PT evaluation   Cervical / Trunk Assessment Cervical / Trunk Assessment: Normal   Communication Communication Communication: No difficulties   Cognition Arousal/Alertness: Awake/alert Behavior During Therapy: WFL for tasks assessed/performed Overall Cognitive Status: Within Functional Limits for tasks assessed                                     General Comments  bil LE skin cracking/peeling     Exercises     Shoulder Instructions      Home Living Family/patient expects to be discharged to:: Private residence Living Arrangements: Alone Available Help at Discharge: Available PRN/intermittently;Friend(s) Type of Home: Apartment Home Access: Elevator     Home Layout: One level     Bathroom Shower/Tub: Teacher, early years/pre: Standard     Home Equipment: Cane - single point;Shower seat;Grab bars -  tub/shower          Prior Functioning/Environment Level of Independence: Independent with assistive device(s)        Comments: uses SPC for ambulation.  He denies falls.   He reports he drives to HD and ambulates through grocery store         OT Problem List: Decreased strength;Decreased activity tolerance      OT Treatment/Interventions:      OT Goals(Current goals can be found in the care plan section) Acute Rehab OT Goals Patient Stated Goal: to get home  OT Goal Formulation: All assessment and education complete, DC therapy  OT Frequency:     Barriers to D/C:            Co-evaluation              AM-PAC PT "6 Clicks" Daily Activity     Outcome Measure Help from another person eating meals?: None Help from another person taking  care of personal grooming?: None Help from another person toileting, which includes using toliet, bedpan, or urinal?: None Help from another person bathing (including washing, rinsing, drying)?: None Help from another person to put on and taking off regular upper body clothing?: None Help from another person to put on and taking off regular lower body clothing?: None 6 Click Score: 24   End of Session Equipment Utilized During Treatment: Gait belt Nurse Communication: Mobility status  Activity Tolerance: Patient tolerated treatment well Patient left: in bed;with call bell/phone within reach  OT Visit Diagnosis: Unsteadiness on feet (R26.81)                Time: 3794-3276 OT Time Calculation (min): 23 min Charges:  OT General Charges $OT Visit: 1 Visit OT Evaluation $OT Eval Low Complexity: 1 Low OT Treatments $Self Care/Home Management : 8-22 mins  Lucille Passy, OTR/L Acute Rehabilitation Services Pager [phone] Office [phone]   Lucille Passy M 09/11/2018, 1:12 PM

## 2018-09-11 NOTE — Consult Note (Addendum)
[facility] Renal Consultation Note    Indication for Consultation:  Management of ESRD/hemodialysis; anemia, hypertension/volume and secondary hyperparathyroidism  HPI: [name] is a 66 y.o. male with ESRD on HD TTS at [facility]. PMH also significant for DMT2, HTN, chronic lymphedema, MGUS.   Admitted for evaluation of chest pain, lightheadedness that began Saturday. Noted to have fever and "shaking chills" at dialysis on 9/28. Blood cultures were drawn and received IV Vanc 2g/IV Fortaz 2 g.  Presented to ED Monday with continued chest pain. Afebrile on admission. Hypertensive with HR 50s. EKG showing sinus bradycardia. Troponin 0.3 >0.7. Cardiology consulted. Labs: K 4.3, BUN 84, Cr 16.35, WBC 8.1, Hgb 10.5, Plts 82.  Today he says that his chest pain has improved. No lightheadedness when he stood up earlier. Denies HA, lightheadedness, SOB, abd pain, N,V. He has chronic LE edema and wraps his legs 2x/week at home. He denies any redness, oozing, drainage from his legs.     Last dialysis was Saturday. He was able to complete his full treatment. He does tell me that he has an appointment for a fistulogram tomorrow at [facility]. This was scheduled d/t low access flows. He denies recent cannulation problems.   Past Medical History:  Diagnosis Date  . Arthritis   . Asthma    as a child  . Chronic cystitis   . Chronic diastolic heart failure ([place])   . Chronic kidney disease    HD pt, 3 times a week.  . Diabetes mellitus   . Dysrhythmia   . GERD (gastroesophageal reflux disease)   . H/O hiatal hernia    states it's been fixed  . Hyperlipidemia   . Hypertension   . Lymphedema   . Morbid obesity ([place])   . NEPHROLITHIASIS, HX OF 12/02/2009   Qualifier: Diagnosis of  By: [name], Cat    . PVD (peripheral vascular disease) ([place])   . Renal insufficiency   . UMBILICAL HERNIA 1/96/2229   Qualifier: History of  By: [name] MD, Audelia Acton    . Venous insufficiency     Past Surgical History:  Procedure Laterality Date  . ABDOMINAL AORTOGRAM W/LOWER EXTREMITY N/A 05/19/2017   Procedure: Abdominal Aortogram w/Lower Extremity;  Surgeon: [name], MD;  Location: [place] [facility];  Service: Cardiovascular;  Laterality: N/A;  . AMPUTATION     Right and left fifth toes.   . AMPUTATION TOE     emoval of both little toes  . AV FISTULA PLACEMENT Left 03/21/2014   Procedure: ARTERIOVENOUS (AV) FISTULA CREATION with ultrasound;  Surgeon: [name], MD;  Location: [place];  Service: Vascular;  Laterality: Left;  . COLONOSCOPY    . EYE SURGERY Bilateral    cataract and lens implant  . HERNIA REPAIR    . LIGATION OF COMPETING BRANCHES OF ARTERIOVENOUS FISTULA Left 06/29/2015   Procedure: LIGATION OF LEFT ARM RADIOCEPHALIC ARTERIOVENOUS FISTULA SIDE BRANCHES;  Surgeon: Conrad Sebring, MD;  Location: [place];  Service: Vascular;  Laterality: Left;  [name] MULTIPLE EXTRACTIONS WITH ALVEOLOPLASTY N/A 04/07/2017   Procedure: Extraction of tooth #'s 1-11, 13, 14,16, 20-23, and 26-28 with alveoloplasty;  Surgeon: Lenn Cal, DDS;  Location: [place];  Service: Oral Surgery;  Laterality: N/A;  . Popliteal to posterior tibial bypass     2006  . R knee arthoscopic repair of meniscus    . UMBILICAL HERNIA REPAIR     Family History  Problem Relation Age of Onset  .  Diabetes Mother   . Heart disease Mother   . Diabetes Brother    Social History:  reports that he quit smoking about 39 years ago. He has quit using smokeless tobacco. He reports that he does not drink alcohol or use drugs. Allergies  Allergen Reactions  . Shrimp [Shellfish Allergy] Swelling   Prior to Admission medications   Medication Sig Start Date End Date Taking? Authorizing Provider  acetaminophen (TYLENOL) 500 MG tablet Take 1,000 mg by mouth daily as needed for moderate pain or headache.   Yes [provider]  amLODipine (NORVASC) 10 MG tablet Take 10 mg by mouth at bedtime.  02/03/17   Yes [provider]  carvedilol (COREG) 6.25 MG tablet take 1 tablet by mouth twice a day with meals Patient taking differently: Take 6.25 mg by mouth 2 (two) times daily with a meal.  05/29/17  Yes McKeag, Marylynn Pearson, MD  furosemide (LASIX) 80 MG tablet Take 1 tablet (80 mg total) by mouth 2 (two) times daily. 06/15/16  Yes McKeag, Marylynn Pearson, MD  clotrimazole (LOTRIMIN) 1 % cream Apply 1 application topically 2 (two) times daily. Patient not taking: Reported on 07/10/2018 04/15/17   Sela Hilding, MD  predniSONE (DELTASONE) 20 MG tablet Take 20 mg by mouth daily. 09/06/18   [provider]  urea 10 % lotion Apply topically 2 (two) times daily. Patient not taking: Reported on 07/10/2018 03/28/17   [name], MD   Current Facility-Administered Medications  Medication Dose Route Frequency Provider Last Rate Last Dose  . acetaminophen (TYLENOL) tablet 650 mg  650 mg Oral Q6H PRN Bufford Lope, DO       Or  . acetaminophen (TYLENOL) suppository 650 mg  650 mg Rectal Q6H PRN Bufford Lope, DO      . amLODipine (NORVASC) tablet 10 mg  10 mg Oral QHS Orson Eva J, DO      . Chlorhexidine Gluconate Cloth 2 % PADS 6 each  6 each Topical Q0600 Ejigiri, Thomos Lemons, PA-C      . insulin aspart (novoLOG) injection 0-9 Units  0-9 Units Subcutaneous TID WC Yoo, Elsia J, DO      . polyethylene glycol (MIRALAX / GLYCOLAX) packet 17 g  17 g Oral Daily PRN Shawna Orleans, Elsia J, DO         ROS: As per HPI otherwise negative.  Physical Exam: Vitals:   09/11/18 0932 09/11/18 0942 09/11/18 0948 09/11/18 1130  BP:  (!) 203/69 (!) 173/77 (!) 163/62  Pulse: (!) 56 (!) 52  (!) 53  Resp:    19  Temp:    98.3 F (36.8 C)  TempSrc:    Oral  SpO2:  100%  100%  Weight:      Height:         General: WDWN male pleasant NAD  Head: NCAT sclera not icteric MMM Neck: Supple. No JVD No masses Lungs: CTA Heart: RRR with S1 S2 Abdomen: soft NT + BS Lower extremities: woody edema with chronic vascular skin  changes  Neuro: A & O  X 3. Moves all extremities spontaneously. Psych:  Responds to questions appropriately with a normal affect. Dialysis Access: LUE AVF +bruit   Labs: Basic Metabolic Panel: Recent Labs  Lab 09/10/18 0723 09/10/18 0743 09/10/18 1657 09/11/18 0826  NA 138 138  --  138  K 4.3 4.2  --  4.3  CL 96* 99  --  96*  CO2 27  --   --  25  GLUCOSE 213* 215*  --  115*  BUN 70* 65*  --  84*  CREATININE 15.50* 16.20* 15.71* 16.35*  CALCIUM 8.8*  --   --  8.8*  PHOS  --   --   --  6.2*   Liver Function Tests: Recent Labs  Lab 09/11/18 0826  ALBUMIN 3.1*   No results for input(s): LIPASE, AMYLASE in the last 168 hours. No results for input(s): AMMONIA in the last 168 hours. CBC: Recent Labs  Lab 09/10/18 0723 09/10/18 0743 09/11/18 0337  WBC 5.7  --  8.1  HGB 10.8* 11.2* 10.5*  HCT 34.2* 33.0* 32.8*  MCV 97.2  --  94.8  PLT 66*  --  82*   Cardiac Enzymes: Recent Labs  Lab 09/10/18 1150 09/10/18 1657 09/10/18 2243  TROPONINI 0.03* 0.03* 0.07*   CBG: Recent Labs  Lab 09/10/18 1855 09/10/18 2147 09/11/18 0743 09/11/18 1143  GLUCAP 176* 154* 96 126*   Iron Studies: No results for input(s): IRON, TIBC, TRANSFERRIN, FERRITIN in the last 72 hours. Studies/Results: Dg Chest 2 View  Result Date: 09/10/2018 CLINICAL DATA:  Onset of left chest pressure and lightheadedness on Saturday while at dialysis which has persisted. No shortness of breath or cough. EXAM: CHEST - 2 VIEW COMPARISON:  PA and lateral chest x-ray of July 10, 2018 FINDINGS: The lungs are well-expanded. There is no focal infiltrate. There is left basilar scarring versus diaphragmatic calcification which is stable. The heart and pulmonary vascularity are normal. There is calcification in the wall of the aortic arch. The trachea is midline. The bony thorax exhibits no acute abnormality. IMPRESSION: Chronic bronchitic-reactive airway changes. No acute pneumonia nor pulmonary edema. Stable left  basilar scarring. Thoracic aortic atherosclerosis. Electronically Signed   By: David  Martinique M.D.   On: 09/10/2018 08:32    Dialysis Orders:  [facility] 4.25h 200NRe 450/800 EDW 113kg 2K/2Ca  L AVF No heparin -Calcitriol 1.12mcg TIW -Mircera 100 mcg IV q 2 weeks (last 9/12) -Sensipar 60 TIW   Assessment/Plan: 1. Chest pain - Improved per patient. Remains bradycardic. Cards consulted. For Echo today  2. Fever/chills at dialysis on 9/28-- Got IV Vanc/Fortaz and had blood cultures drawn. Spoke with dialysis unit - No results back yet. Will continue to follow.  3. ESRD -  TTS. For HD today on schedule. Had [facility] appointment 10/2 for fistulogram - will reschedule.  4. Hypertension/volume  - BP elevated. Holding Coreg 2/2 low HR. UF for volume removal as tolerated.   5. Anemia  - Hgb 10.5. No ESA needed yet. Follow trends.  6. Metabolic bone disease -  Continue VDRA/Velphoro binder/Sensipar  7. Lymphedema, chronic, 2x/week dsg changes: WOC following  8. Nutrition - Renal diet/vitamins/prot supp for low albumin  9. Thrombocytopenia - chronic,stable. No heparin on HD   Lynnda Child PA-C [place] Pager [phone] 09/11/2018, 11:47 AM   Pt seen, examined and agree w A/P as above.  [name] MD [employer] pager [phone]   09/11/2018, 12:51 PM

## 2018-09-11 NOTE — Progress Notes (Signed)
Orthopedic Tech Progress Note Patient Details:  [name] Apr 11, 1951 [id number]  Patient ID: [name], male   DOB: 02-15-1951, 67 y.o.   MRN: [id number]   [place] Pink 09/11/2018, 6:08 PMStop by patient's room to apply unna boot wraps twice today.  Patient has been getting dialysis treatments.  Will pass on patients needs to second shift.

## 2018-09-11 NOTE — Progress Notes (Signed)
OT Cancellation Note  Patient Details Name: [name] MRN: [id number] DOB: 02-17-1951   Cancelled Treatment:    Reason Eval/Treat Not Completed: Fatigue/lethargy limiting ability to participate.  Pt sleeping soundly.  Will reattempt.  Lucille Passy, OTR/L Acute Rehabilitation Services Pager [phone] Office [phone]   Lucille Passy M 09/11/2018, 11:17 AM

## 2018-09-11 NOTE — Consult Note (Addendum)
Cardiology Consultation:   Patient ID: [name] MRN: [id number]; DOB: Feb 24, 1951  Admit date: 09/10/2018 Date of Consult: 09/11/2018  Primary Care Provider: Richarda Osmond, DO Primary Cardiologist: Minus Breeding, MD  Primary Electrophysiologist:  None    Patient Profile:   [name] is a 67 y.o. male with a hx of negative myoview 07/1190, chronic diastolic heart failure with volume managed by HD, morbid obesity, PVD, morbid obesity, lymphedema, DM, GERD, and ESRD on HD who is being seen today for the evaluation of chest pain at the request of Dr. Owens Shark.  History of Present Illness:   [name] last saw Dr. Percival Spanish in clinic on 05/26/17. He has a history of suspected Afib in the ER in 2015, but not seen in telemetry. Heart monitor showed bradycardia but no atrial fibrillation. He was not anticoagulated.  He has a history of endocarditis ([place]). TEE unable to be completed due to loose dentition.  He did have evidence of Afib in 03/2017 in the setting of profound anemia. Not anticoagulated at that time given his anemia. TEE was attempted 03/27/17 for bacteremia, unable to complete due to dentition.   At last clinic visit, he was doing well with no medication changes. He is maintained on norvasc, coreg, and 80 mg lasix BID.   He presented to [facility] 09/10/18 with a 2-day history of left-sided chest pain described as a pressure. On my interview, he went to HD on Saturday and received 3 hours of treatment. He then received ABX for an low grade fever for 1 hour. He returned home and was sitting in his recliner. He leaned to the left to pick something up and experienced left-sided chest pain. The pain resolved when he sat back up straight. He also experienced some lightheadedness.  The chest pain radiated to his back. He denies shortness of breath, diaphoresis, and N/V. He states that he experienced the pain off and on over the weekend. Pain was not worse with eating, deep inspiration, or  moving his upper extremities, but did seem to correlate with upper body movement. He came to the ER at the urging of a friend.   Pt was admitted to teaching service. Troponins mildly elevated: 0.03 --> 0.03 --> 0.07, but in the setting of ESRD. EKG appears nonacute. He has not had a recurrence of the chest pain since being in the hospital.    Past Medical History:  Diagnosis Date  . Arthritis   . Asthma    as a child  . Chronic cystitis   . Chronic diastolic heart failure ([place])   . Chronic kidney disease    HD pt, 3 times a week.  . Diabetes mellitus   . Dysrhythmia   . GERD (gastroesophageal reflux disease)   . H/O hiatal hernia    states it's been fixed  . Hyperlipidemia   . Hypertension   . Lymphedema   . Morbid obesity ([place])   . NEPHROLITHIASIS, HX OF 12/02/2009   Qualifier: Diagnosis of  By: [name], Cat    . PVD (peripheral vascular disease) ([place])   . Renal insufficiency   . UMBILICAL HERNIA 4/78/2956   Qualifier: History of  By: [name] MD, Audelia Acton    . Venous insufficiency     Past Surgical History:  Procedure Laterality Date  . ABDOMINAL AORTOGRAM W/LOWER EXTREMITY N/A 05/19/2017   Procedure: Abdominal Aortogram w/Lower Extremity;  Surgeon: [name], MD;  Location: [place] [facility];  Service: Cardiovascular;  Laterality: N/A;  .  AMPUTATION     Right and left fifth toes.   . AMPUTATION TOE     emoval of both little toes  . AV FISTULA PLACEMENT Left 03/21/2014   Procedure: ARTERIOVENOUS (AV) FISTULA CREATION with ultrasound;  Surgeon: [name], MD;  Location: [place];  Service: Vascular;  Laterality: Left;  . COLONOSCOPY    . EYE SURGERY Bilateral    cataract and lens implant  . HERNIA REPAIR    . LIGATION OF COMPETING BRANCHES OF ARTERIOVENOUS FISTULA Left 06/29/2015   Procedure: LIGATION OF LEFT ARM RADIOCEPHALIC ARTERIOVENOUS FISTULA SIDE BRANCHES;  Surgeon: Conrad El Negro, MD;  Location: [place];  Service: Vascular;  Laterality: Left;  [name] MULTIPLE  EXTRACTIONS WITH ALVEOLOPLASTY N/A 04/07/2017   Procedure: Extraction of tooth #'s 1-11, 13, 14,16, 20-23, and 26-28 with alveoloplasty;  Surgeon: Lenn Cal, DDS;  Location: [place];  Service: Oral Surgery;  Laterality: N/A;  . Popliteal to posterior tibial bypass     2006  . R knee arthoscopic repair of meniscus    . UMBILICAL HERNIA REPAIR       Home Medications:  Prior to Admission medications   Medication Sig Start Date End Date Taking? Authorizing Provider  acetaminophen (TYLENOL) 500 MG tablet Take 1,000 mg by mouth daily as needed for moderate pain or headache.   Yes [provider]  amLODipine (NORVASC) 10 MG tablet Take 10 mg by mouth at bedtime.  02/03/17  Yes [provider]  carvedilol (COREG) 6.25 MG tablet take 1 tablet by mouth twice a day with meals Patient taking differently: Take 6.25 mg by mouth 2 (two) times daily with a meal.  05/29/17  Yes McKeag, Marylynn Pearson, MD  furosemide (LASIX) 80 MG tablet Take 1 tablet (80 mg total) by mouth 2 (two) times daily. 06/15/16  Yes McKeag, Marylynn Pearson, MD  clotrimazole (LOTRIMIN) 1 % cream Apply 1 application topically 2 (two) times daily. Patient not taking: Reported on 07/10/2018 04/15/17   Sela Hilding, MD  predniSONE (DELTASONE) 20 MG tablet Take 20 mg by mouth daily. 09/06/18   [provider]  urea 10 % lotion Apply topically 2 (two) times daily. Patient not taking: Reported on 07/10/2018 03/28/17   [name], MD    Inpatient Medications: Scheduled Meds: . amLODipine  10 mg Oral QHS  . Chlorhexidine Gluconate Cloth  6 each Topical Q0600  . insulin aspart  0-9 Units Subcutaneous TID WC   Continuous Infusions:  PRN Meds: acetaminophen **OR** acetaminophen, polyethylene glycol  Allergies:    Allergies  Allergen Reactions  . Shrimp [Shellfish Allergy] Swelling    Social History:   Social History   Socioeconomic History  . Marital status: Legally Separated    Spouse name: Not on file  .  Number of children: 1  . Years of education: Not on file  . Highest education level: Not on file  Occupational History    Employer: DISABLED   Social Needs  . Financial resource strain: Not on file  . Food insecurity:    Worry: Not on file    Inability: Not on file  . Transportation needs:    Medical: Not on file    Non-medical: Not on file  Tobacco Use  . Smoking status: Former Smoker    Last attempt to quit: 06/06/1979    Years since quitting: 39.2  . Smokeless tobacco: Former Network engineer and Sexual Activity  . Alcohol use: No    Alcohol/week: 0.0 standard  drinks    Comment:   "years ago" , none now  . Drug use: No  . Sexual activity: Never  Lifestyle  . Physical activity:    Days per week: Not on file    Minutes per session: Not on file  . Stress: Not on file  Relationships  . Social connections:    Talks on phone: Not on file    Gets together: Not on file    Attends religious service: Not on file    Active member of club or organization: Not on file    Attends meetings of clubs or organizations: Not on file    Relationship status: Not on file  . Intimate partner violence:    Fear of current or ex partner: Not on file    Emotionally abused: Not on file    Physically abused: Not on file    Forced sexual activity: Not on file  Other Topics Concern  . Not on file  Social History Narrative   Lives alone.  Only son is deceased.     Family History:    Family History  Problem Relation Age of Onset  . Diabetes Mother   . Heart disease Mother   . Diabetes Brother      ROS:  Please see the history of present illness.   All other ROS reviewed and negative.     Physical Exam/Data:   Vitals:   09/11/18 0858 09/11/18 0932 09/11/18 0942 09/11/18 0948  BP: (!) 151/57  (!) 203/69 (!) 173/77  Pulse: (!) 50 (!) 56 (!) 52   Resp:      Temp:      TempSrc:      SpO2: 100%  100%   Weight:      Height:        Intake/Output Summary (Last 24 hours) at 09/11/2018  1100 Last data filed at 09/11/2018 0859 Gross per 24 hour  Intake 320 ml  Output 100 ml  Net 220 ml   Filed Weights   09/10/18 2038 09/11/18 0545  Weight: 115.7 kg 115.1 kg   Body mass index is 35.38 kg/m.  General:  Well nourished, well developed, in no acute distress HEENT: normal Neck: no JVD Vascular: No carotid bruits Cardiac:  normal S1, S2; RRR + murmur Lungs:  clear to auscultation bilaterally, no wheezing, rhonchi or rales  Abd: soft, nontender, no hepatomegaly  Ext: trace edema Musculoskeletal:  No deformities, BUE and BLE strength normal and equal, AV fistula Skin: warm and dry  Neuro:  CNs 2-12 intact, no focal abnormalities noted Psych:  Normal affect   EKG:  The EKG was personally reviewed and demonstrates:  Sinus to sinus bradycardia Telemetry:  Telemetry was personally reviewed and demonstrates:  sinus  Relevant CV Studies:  Echo 09/11/18:  Study Conclusions - Left ventricle: The cavity size was normal. Wall thickness was   normal. Systolic function was normal. The estimated ejection   fraction was in the range of 60% to 65%. Wall motion was normal;   there were no regional wall motion abnormalities. Features are   consistent with a pseudonormal left ventricular filling pattern,   with concomitant abnormal relaxation and increased filling   pressure (grade 2 diastolic dysfunction). - Mitral valve: There was mild regurgitation. - Left atrium: The atrium was mildly dilated. - Right atrium: The atrium was mildly dilated.   Echo 03/24/17: Study Conclusions - Left ventricle: The cavity size was normal. There was mild   concentric hypertrophy. Systolic function  was normal. The   estimated ejection fraction was in the range of 55% to 60%.   Although no diagnostic regional wall motion abnormality was   identified, this possibility cannot be completely excluded on the   basis of this study. Features are consistent with a pseudonormal   left ventricular  filling pattern, with concomitant abnormal   relaxation and increased filling pressure (grade 2 diastolic   dysfunction).   Event monitor 04/05/17: NSR and sinus bradycardia There is a significant amount of baseline artifact and wander that reduces the ability to interpret this monitor Some strips suggest an atypical atrial flutter but likely represents artifact.  There is no evidence of atrial fib.   Laboratory Data:  Chemistry Recent Labs  Lab 09/10/18 0723 09/10/18 0743 09/10/18 1657 09/11/18 0826  NA 138 138  --  138  K 4.3 4.2  --  4.3  CL 96* 99  --  96*  CO2 27  --   --  25  GLUCOSE 213* 215*  --  115*  BUN 70* 65*  --  84*  CREATININE 15.50* 16.20* 15.71* 16.35*  CALCIUM 8.8*  --   --  8.8*  GFRNONAA 3*  --  3* 3*  GFRAA 3*  --  3* 3*  ANIONGAP 15  --   --  17*    Recent Labs  Lab 09/11/18 0826  ALBUMIN 3.1*   Hematology Recent Labs  Lab 09/10/18 0723 09/10/18 0743 09/11/18 0337  WBC 5.7  --  8.1  RBC 3.52*  --  3.46*  HGB 10.8* 11.2* 10.5*  HCT 34.2* 33.0* 32.8*  MCV 97.2  --  94.8  MCH 30.7  --  30.3  MCHC 31.6  --  32.0  RDW 17.5*  --  17.3*  PLT 66*  --  82*   Cardiac Enzymes Recent Labs  Lab 09/10/18 1150 09/10/18 1657 09/10/18 2243  TROPONINI 0.03* 0.03* 0.07*    Recent Labs  Lab 09/10/18 0741 09/10/18 1128  TROPIPOC 0.04 0.07    BNPNo results for input(s): BNP, PROBNP in the last 168 hours.  DDimer No results for input(s): DDIMER in the last 168 hours.  Radiology/Studies:  Dg Chest 2 View  Result Date: 09/10/2018 CLINICAL DATA:  Onset of left chest pressure and lightheadedness on Saturday while at dialysis which has persisted. No shortness of breath or cough. EXAM: CHEST - 2 VIEW COMPARISON:  PA and lateral chest x-ray of July 10, 2018 FINDINGS: The lungs are well-expanded. There is no focal infiltrate. There is left basilar scarring versus diaphragmatic calcification which is stable. The heart and pulmonary vascularity are  normal. There is calcification in the wall of the aortic arch. The trachea is midline. The bony thorax exhibits no acute abnormality. IMPRESSION: Chronic bronchitic-reactive airway changes. No acute pneumonia nor pulmonary edema. Stable left basilar scarring. Thoracic aortic atherosclerosis. Electronically Signed   By: David  Martinique M.D.   On: 09/10/2018 08:32    Assessment and Plan:   1. Chest pain - Troponins mildly elevated: 0.03 --> 0.03 --> 0.07 - EKG without acute ischemia - chest pain seems positional in nature - echo without wall motion abnormalities, normal EF, and stable grade 2 diastolic dysfunction - negative stress test in 2015 - chest pain is atypical in nature with mildly flat troponins and nonacute EKG, low suspicion for an ACS process - will defer ischemic testing for now  2. Chronic diastolic heart failure - echo 2018 with normal EF and grade 2 DD -  volume status managed by HD - pt appears euvolemic  3. ESRD on HD - patient is compliant on HD  - last treatment was his scheduled day on Sat - received 2 g Vanc and 2 g fortaz after HD for fever and shaking chills - will not pursue repeat TEE unless positive blood cultures  4. History of paroxysmal atrial fibrillation - has been in sinus rhythm - no medication changes or anticoagulation needed  [facility] will sign off.   Medication Recommendations:  Continue home meds Other recommendations (labs, testing, etc):  Defer ischemic testing for now Follow up as an outpatient:  Will arrange for cardiology follow up as an outpatient  For questions or updates, please contact [place] Please consult [email or web address] for contact info under   Signed, Ledora Bottcher, PA  09/11/2018 11:00 AM  The patient was seen, examined and discussed with Minette Brine , PA-C and I agree with the above.   A 67 y.o. male with a hx chronic diastolic heart failure with volume managed by HD, morbid obesity, PVD, lymphedema, DM,  GERD, anemia, ESRD on HD followed by Dr Percival Spanish, last seen in clinic on 05/26/17. He has a history of suspected Afib in the ER in 2015, but not seen in telemetry.  He presented to [facility] 09/10/18 with a 2-day history of left-sided chest pain described as a pressure. This happened after his regular HD on Sunday. The pain was positional, worse with eating, deep inspiration, or moving his upper extremities, not associated with shortness of breath, diaphoresis, and N/V.  Troponin: 0.03 --> 0.03 --> 0.07, Crea 16. ECG normal. He is now chest pain free.  Echo done today shows preserved LVEF 60% to 65%, no regional WMA, grade 2 DD with elevated filling pressures. Chest X ray shows mild pulmonary edema. Since the pain is atypical I would not proceed with an ischemic workup but arrange for an extra HD for fluid overload. His weight today is 115 kg, baseline unknown.  Ena Dawley, MD 09/11/2018

## 2018-09-11 NOTE — Consult Note (Signed)
[facility] Nurse wound consult note Patient evaluated in [place].  No family present. Reason for Consult:Unna boot application bilaterally Wound type: No open wounds present.  Tissue changes to bilateral lower legs consistent with venous insufficiency.  Patient states he has been wearing unna boots for 5 years and they are changed twice weekly.  I have ordered for these to be applied bilaterally today and changed on Friday. Thank you for the consult.  Discussed plan of care with the patient and bedside nurse.  [facility] nurse will not follow at this time.  Please re-consult the [facility] team if needed.  Val Riles, RN, MSN, CWOCN, CNS-BC, pager [phone]

## 2018-09-12 DIAGNOSIS — E1121 Type 2 diabetes mellitus with diabetic nephropathy: Secondary | ICD-10-CM | POA: Diagnosis not present

## 2018-09-12 DIAGNOSIS — N186 End stage renal disease: Secondary | ICD-10-CM | POA: Diagnosis not present

## 2018-09-12 DIAGNOSIS — R001 Bradycardia, unspecified: Secondary | ICD-10-CM | POA: Diagnosis not present

## 2018-09-12 DIAGNOSIS — R071 Chest pain on breathing: Secondary | ICD-10-CM | POA: Diagnosis not present

## 2018-09-12 LAB — CBC
HCT: 30 % — ABNORMAL LOW (ref 39.0–52.0)
Hemoglobin: 9.9 g/dL — ABNORMAL LOW (ref 13.0–17.0)
MCH: 30.5 pg (ref 26.0–34.0)
MCHC: 33 g/dL (ref 30.0–36.0)
MCV: 92.3 fL (ref 78.0–100.0)
PLATELETS: 92 10*3/uL — AB (ref 150–400)
RBC: 3.25 MIL/uL — ABNORMAL LOW (ref 4.22–5.81)
RDW: 17.2 % — ABNORMAL HIGH (ref 11.5–15.5)
WBC: 7.2 10*3/uL (ref 4.0–10.5)

## 2018-09-12 LAB — RENAL FUNCTION PANEL
ALBUMIN: 2.7 g/dL — AB (ref 3.5–5.0)
Anion gap: 12 (ref 5–15)
BUN: 36 mg/dL — AB (ref 8–23)
CALCIUM: 8 mg/dL — AB (ref 8.9–10.3)
CO2: 28 mmol/L (ref 22–32)
CREATININE: 9.37 mg/dL — AB (ref 0.61–1.24)
Chloride: 93 mmol/L — ABNORMAL LOW (ref 98–111)
GFR calc Af Amer: 6 mL/min — ABNORMAL LOW (ref >60)
GFR calc non Af Amer: 5 mL/min — ABNORMAL LOW (ref >60)
GLUCOSE: 108 mg/dL — AB (ref 70–99)
Phosphorus: 4.2 mg/dL (ref 2.5–4.6)
Potassium: 3.5 mmol/L (ref 3.5–5.1)
SODIUM: 133 mmol/L — AB (ref 135–145)

## 2018-09-12 LAB — GLUCOSE, CAPILLARY
Glucose-Capillary: 127 mg/dL — ABNORMAL HIGH (ref 70–99)
Glucose-Capillary: 130 mg/dL — ABNORMAL HIGH (ref 70–99)

## 2018-09-12 MED ORDER — CINACALCET HCL 30 MG PO TABS: 60.0000 mg | ORAL_TABLET | ORAL | 0 refills | Status: DC

## 2018-09-12 MED ORDER — CALCITRIOL 0.5 MCG PO CAPS: 1.5000 ug | ORAL_CAPSULE | ORAL | 0 refills | Status: DC

## 2018-09-12 MED ORDER — RENA-VITE PO TABS: 1.0000 | ORAL_TABLET | Freq: Every day | ORAL | 0 refills | Status: DC

## 2018-09-12 NOTE — Discharge Instructions (Signed)
You were admitted to the hospital for having chest pain.  Your work up of your heart looked great.  You should follow up with cardiology in the future.  If you have chest pain again, please let your doctor know.  Return to the ED if it does not resolve with rest.  Your heart has been beating slow, so for now, do not take your Coreg.  Your doctor will give you further instructions in regards to this at your follow up appointment.  Your lightheadedness has improved, which we are happy about.  This may have been caused by your slow heartbeat, but was most likely caused by you needing more fluid taken off at dialysis.  Continue to go to your regularly scheduled dialysis.   Nonspecific Chest Pain Chest pain can be caused by many different conditions. There is a chance that your pain could be related to something serious, such as a heart attack or a blood clot in your lungs. Chest pain can also be caused by conditions that are not life-threatening. If you have chest pain, it is very important to follow up with your doctor. Follow these instructions at home: Medicines  If you were prescribed an antibiotic medicine, take it as told by your doctor. Do not stop taking the antibiotic even if you start to feel better.  Take over-the-counter and prescription medicines only as told by your doctor. Lifestyle  Do not use any products that contain nicotine or tobacco, such as cigarettes and e-cigarettes. If you need help quitting, ask your doctor.  Do not drink alcohol.  Make lifestyle changes as told by your doctor. These may include: ? Getting regular exercise. Ask your doctor for some activities that are safe for you. ? Eating a heart-healthy diet. A diet specialist (dietitian) can help you to learn healthy eating options. ? Staying at a healthy weight. ? Managing diabetes, if needed. ? Lowering your stress, as with deep breathing or spending time in nature. General instructions  Avoid any  activities that make you feel chest pain.  If your chest pain is because of heartburn: ? Raise (elevate) the head of your bed about 6 inches (15 cm). You can do this by putting blocks under the bed legs at the head of the bed. ? Do not sleep with extra pillows under your head. That does not help heartburn.  Keep all follow-up visits as told by your doctor. This is important. This includes any further testing if your chest pain does not go away. Contact a doctor if:  Your chest pain does not go away.  You have a rash with blisters on your chest.  You have a fever.  You have chills. Get help right away if:  Your chest pain is worse.  You have a cough that gets worse, or you cough up blood.  You have very bad (severe) pain in your belly (abdomen).  You are very weak.  You pass out (faint).  You have either of these for no clear reason: ? Sudden chest discomfort. ? Sudden discomfort in your arms, back, neck, or jaw.  You have shortness of breath at any time.  You suddenly start to sweat, or your skin gets clammy.  You feel sick to your stomach (nauseous).  You throw up (vomit).  You suddenly feel light-headed or dizzy.  Your heart starts to beat fast, or it feels like it is skipping beats. These symptoms may be an emergency. Do not wait to see if the symptoms  will go away. Get medical help right away. Call your local emergency services (911 in the [place] Do not drive yourself to the hospital. This information is not intended to replace advice given to you by your health care provider. Make sure you discuss any questions you have with your health care provider. Document Released: 05/16/2008 Document Revised: 08/22/2016 Document Reviewed: 08/22/2016 Elsevier Interactive Patient Education  2017 [employer].

## 2018-09-12 NOTE — Progress Notes (Addendum)
Family Medicine Teaching Service Daily Progress Note Intern Pager: [phone]  Patient name: [name] Medical record number: [id number] Date of birth: [birth year] Age: 67 y.o. Gender: male  Primary Care Provider: Richarda Osmond, DO Consultants: cardiology, nephrology Code Status: Full  Pt Overview and Major Events to Date:  09/10/18 admitted with chest pain  Assessment and Plan: ADMIR CANDELAS is a 67 y.o. male who presented with lightheadedness and chest pain. PMH is significant for ESRD on HD TTS, CHF, HTN, HLD, paroxysmal A.fib, Anemia of chronic disease, Venous stasis ulcers, T2DM.  Chest Pain, resolved:  EKG NSR.  EF 60-65%, G2DD.  Cards notes low suspicion for ACS and will defer ischemic testing. - monitoring on telemetry - cards consulted, appreciate recs - holding home coreg for bradycardia  Lightheadedness and h/o recent fever :  Has remained afebrile since initial reported fever at HD on 9/28.  WBC 7.2, Blood Cx NG2D.  Has remained hypertensive, therefore hypotension not source of lightheadedness, remains bradycardic. Orthostatics close to positive by SBP, but not overtly positive.   - monitor off ABX, if spikes fever would obtain BCx and start vanc/cefepime - follow up on outpt cultures from HD, appreciate nephro assistance - PT recommends outpatient PT  ESRD on TTS, stable.  Has HD yesterday with more fluid taken off.  Fistulogram at [facility] scheduled for today. - Nephro consulted, appreciate recs - consider reschedule fistulogram  T2DM, stable. A1c 6.2% July 2019 - sSSI - monitor CBGs  Thrombocytopenia, improved:  Plts 66>82>92 today. Likely related to MGUS. Platelets 42 in July. - Monitor Plts  - Follow up as outpatient  HTN: BP elevated following dialysis yesterday to 174/62.  BP 134/59 this AM, which is improved. - Continue home norvasc - holding home coreg as per above - monitor BPs post HD  HFpEF, stable.   Echo 10/1 showed EF  60-65% with G2DD. Home medication includes lasix,carvedilol 6.25 mg BID.  - Continue home norvasc  Venous Insufficiency, stable. - Wound care consult - F/u podiatry outpatient  MGUS, chronic, stable: - follow up as outpatient  FEN/GI: Renal/carb modified diet, Maalox, MiraLAX, Prophylaxis: Lovenox  Disposition: likely home today with outpatient PT  Subjective:  Patient feeling well this AM.  Reports recent Unna Boot change and says, "They are too tight, but they are fine."  Objective: Temp:  [97.5 F (36.4 C)-98.3 F (36.8 C)] 97.9 F (36.6 C) (10/02 0502) Pulse Rate:  [47-58] 55 (10/02 0502) Resp:  [13-20] 20 (10/02 0502) BP: (113-203)/(52-77) 134/59 (10/02 0502) SpO2:  [94 %-100 %] 100 % (10/02 0502) Weight:  [112.8 kg-115 kg] 114.3 kg (10/02 0500)  Physical Exam: General: 67 y.o. male in NAD Cardio: RRR no m/r/g Lungs: CTAB, no wheezing, no rhonchi, no crackles Abdomen: Soft, non-tender to palpation, positive bowel sounds Skin: warm and dry Extremities: Unna boots b/l   Laboratory: Recent Labs  Lab 09/10/18 0723 09/10/18 0743 09/11/18 0337 09/12/18 0443  WBC 5.7  --  8.1 7.2  HGB 10.8* 11.2* 10.5* 9.9*  HCT 34.2* 33.0* 32.8* 30.0*  PLT 66*  --  82* 92*   Recent Labs  Lab 09/10/18 0723 09/10/18 0743 09/10/18 1657 09/11/18 0826  NA 138 138  --  138  K 4.3 4.2  --  4.3  CL 96* 99  --  96*  CO2 27  --   --  25  BUN 70* 65*  --  84*  CREATININE 15.50* 16.20* 15.71* 16.35*  CALCIUM 8.8*  --   --  8.8*  GLUCOSE 213* 215*  --  115*     Trop 0.03 , 0.03, 0.07  Urinalysis    Component Value Date/Time   COLORURINE YELLOW 09/10/2018 1828   APPEARANCEUR CLOUDY (A) 09/10/2018 1828   LABSPEC 1.010 09/10/2018 1828   PHURINE 9.0 (H) 09/10/2018 1828   GLUCOSEU NEGATIVE 09/10/2018 1828   HGBUR MODERATE (A) 09/10/2018 1828   HGBUR negative 04/13/2007 0821   BILIRUBINUR NEGATIVE 09/10/2018 1828   BILIRUBINUR NEG 08/21/2015 1050   KETONESUR NEGATIVE  09/10/2018 1828   PROTEINUR 100 (A) 09/10/2018 1828   UROBILINOGEN 0.2 08/21/2015 1050   UROBILINOGEN 0.2 03/07/2014 1153   NITRITE NEGATIVE 09/10/2018 1828   LEUKOCYTESUR LARGE (A) 09/10/2018 1828     Imaging/Diagnostic Tests: No results found.   [place], DO 09/12/2018, 7:35 AM PGY-1, [place] Intern pager: [phone], text pages welcome

## 2018-09-12 NOTE — Care Management Obs Status (Signed)
[facility] NOTIFICATION   Patient Details  Name: [name] MRN: [id number] Date of Birth: Aug 07, 1951   Medicare Observation Status Notification Given:  Yes    Erenest Rasher, RN 09/12/2018, 10:36 AM

## 2018-09-12 NOTE — Progress Notes (Signed)
CCMD called-  Pt dropped down into low 40s for a short bit.  Has been running in upper 40s- low 50s.     MD made aware

## 2018-09-12 NOTE — Progress Notes (Signed)
Patient being discharge home with Son.  Discussed discharge instruction including medications and follow up appointments with patient.  Sent instruction home along with HF book.  No further questions

## 2018-09-12 NOTE — Progress Notes (Signed)
[facility] Progress Note  Subjective: Blood cx's from last week at OP HD are negative. Patient w/o complaitns.   Vitals:   09/12/18 0500 09/12/18 0502 09/12/18 0848 09/12/18 1213  BP:  (!) 134/59 135/61 129/61  Pulse:  (!) 55 (!) 58 (!) 51  Resp:  20  20  Temp:  97.9 F (36.6 C)  98.4 F (36.9 C)  TempSrc:  Oral  Oral  SpO2:  100% 100% 100%  Weight: 114.3 kg     Height:        Inpatient medications: . amLODipine  10 mg Oral QHS  . [START ON 09/13/2018] calcitRIOL  1.5 mcg Oral Q T,Th,Sa-HD  . Chlorhexidine Gluconate Cloth  6 each Topical Q0600  . cinacalcet  60 mg Oral Q T,Th,Sat-1800  . insulin aspart  0-9 Units Subcutaneous TID WC  . multivitamin  1 tablet Oral QHS  . sucroferric oxyhydroxide  1,000 mg Oral TID WC    acetaminophen **OR** acetaminophen, polyethylene glycol  Iron/TIBC/Ferritin/ %Sat    Component Value Date/Time   IRON 18 (L) 04/13/2017 0507   TIBC 189 (L) 04/13/2017 0507   FERRITIN 1,002 (H) 04/13/2017 0507   IRONPCTSAT 10 (L) 04/13/2017 0507    Exam: General: WDWN male pleasant NAD  Head: NCAT sclera not icteric MMM Neck: Supple. No JVD No masses Lungs: CTA Heart: RRR with S1 S2 Abdomen: soft NT + BS Lower extremities: woody edema with chronic vascular skin changes  Neuro: A & O  X 3. Moves all extremities spontaneously. Psych:  Responds to questions appropriately with a normal affect. Dialysis Access: LUE AVF +bruit   D[facility]: [facility] 4.25h 200NRe 450/800 EDW 113kg 2K/2Ca  L AVF No heparin -Calcitriol 1.19mcg TIW -Mircera 100 mcg IV q 2 weeks (last 9/12) -Sensipar 60 TIW      Impression: 1. Chest pain - Improved per pt. No plan for w/u per cardiology.  2. Fever/chills - at last OP HD. Blood cx's are no growth from that HD session.  3. ESRD - on HD TTS.  4. Hypertension/volume  - stable , close to edw.  5. Anemia  - Hgb 10.5. No ESA needed yet. Follow trends.  6. Metabolic bone disease -  Continue VDRA/Velphoro  binder/Sensipar  7. Lymphedema, chronic, 2x/week dsg changes: WOC following  8. Nutrition - Renal diet/vitamins/prot supp for low albumin  9. Thrombocytopenia - chronic,stable. No heparin on HD   Plan - ok for dc from renal standpoint   [name] MD [facility] pager [phone]   09/12/2018, 1:11 PM   Recent Labs  Lab 09/11/18 0826 09/12/18 0443  NA 138 133*  K 4.3 3.5  CL 96* 93*  CO2 25 28  GLUCOSE 115* 108*  BUN 84* 36*  CREATININE 16.35* 9.37*  CALCIUM 8.8* 8.0*  PHOS 6.2* 4.2  ALBUMIN 3.1* 2.7*   No results for input(s): AST, ALT, ALKPHOS, BILITOT, PROT in the last 168 hours. Recent Labs  Lab 09/11/18 0337 09/12/18 0443  WBC 8.1 7.2  HGB 10.5* 9.9*  HCT 32.8* 30.0*  MCV 94.8 92.3  PLT 82* 92*

## 2018-09-12 NOTE — Progress Notes (Signed)
Physical Therapy Treatment Patient Details Name: [name] MRN: [id number] DOB: 1951-10-07 Today's Date: 09/12/2018    History of Present Illness 67 yo male here for chest pain. PMHx: ESRD on HD, DM II, CHF, HTN, HLD, Afib, anemia, VS ulcers, MGUS, diabetic retinopathy, obesity.     PT Comments    No c/o pain except mild cramping in LUE following HD yesterday.  Pt progressing mobility well and currently performing at distant supervision/mod I level.  No overt balance deficits noted during basic mobility.  Recommend 1 more PT visit acutely before signing off.      Follow Up Recommendations  Outpatient PT     Equipment Recommendations  None recommended by PT    Recommendations for Other Services       Precautions / Restrictions Precautions Precautions: Fall Restrictions Weight Bearing Restrictions: No    Mobility  Bed Mobility               General bed mobility comments: up in chair on arrival  Transfers Overall transfer level: Modified independent Equipment used: Straight cane                Ambulation/Gait Ambulation/Gait assistance: Modified independent (Device/Increase time) Gait Distance (Feet): 400 Feet Assistive device: Straight cane Gait Pattern/deviations: Step-through pattern;Decreased stride length;Wide base of support     General Gait Details: no overt LOB noted, no SOB or DOE   Marine scientist Rankin (Stroke Patients Only)       Balance                                            Cognition Arousal/Alertness: Awake/alert Behavior During Therapy: WFL for tasks assessed/performed Overall Cognitive Status: Within Functional Limits for tasks assessed                                        Exercises      General Comments        Pertinent Vitals/Pain Pain Assessment: No/denies pain    Home Living                      Prior Function             PT Goals (current goals can now be found in the care plan section) Acute Rehab PT Goals Patient Stated Goal: to get home  PT Goal Formulation: With patient Time For Goal Achievement: 09/25/18 Potential to Achieve Goals: Good Progress towards PT goals: Progressing toward goals    Frequency    Min 3X/week      PT Plan Current plan remains appropriate    Co-evaluation              AM-PAC PT "6 Clicks" Daily Activity  Outcome Measure  Difficulty turning over in bed (including adjusting bedclothes, sheets and blankets)?: None Difficulty moving from lying on back to sitting on the side of the bed? : None Difficulty sitting down on and standing up from a chair with arms (e.g., wheelchair, bedside commode, etc,.)?: None Help needed moving to and from a bed to chair (including a wheelchair)?: None Help needed walking in hospital room?: None Help needed climbing 3-5 steps  with a railing? : A Little 6 Click Score: 23    End of Session Equipment Utilized During Treatment: Gait belt Activity Tolerance: Patient tolerated treatment well Patient left: in chair;with call bell/phone within reach Nurse Communication: Mobility status PT Visit Diagnosis: Other abnormalities of gait and mobility (R26.89);Unsteadiness on feet (R26.81)     Time: 0174-9449 PT Time Calculation (min) (ACUTE ONLY): 14 min  Charges:  $Gait Training: 8-22 mins                       Michel Santee 09/12/2018, 10:25 AM

## 2018-09-12 NOTE — Care Management CC44 (Signed)
Condition Code 44 Documentation Completed  Patient Details  Name: ELLIJAH LEFFEL MRN: [id number] Date of Birth: 05/13/1951   Condition Code 44 given:  Yes Patient signature on Condition Code 44 notice:  Yes Documentation of 2 MD's agreement:  Yes Code 44 added to claim:  Yes    Erenest Rasher, RN 09/12/2018, 10:36 AM

## 2018-09-12 NOTE — Discharge Summary (Signed)
[facility] Discharge Summary  Patient name: [name] Medical record number: [id number] Date of birth: 12/15/1950 Age: 67 y.o. Gender: male Date of Admission: 09/10/2018  Date of Discharge: 09/12/2018 Admitting Physician: Martyn Malay, MD  Primary Care Provider: Richarda Osmond, DO Consultants: Cardiology, Nephrology  Indication for Hospitalization: Lightheadedness and Chest Pain  Discharge Diagnoses/Problem List:  Chest pain Lightheadedness and h/o recent fever ESRD on TTS T2DM Thrombocytopenia HTN HFpEF Venous Insuffiencey  MGUS  Disposition: home  Discharge Condition: stable  Discharge Exam:   General: 67 y.o. male in NAD Cardio: RRR no m/r/g Lungs: CTAB, no wheezing, no rhonchi, no crackles Abdomen: Soft, non-tender to palpation, positive bowel sounds Skin: warm and dry Extremities: Unna boots b/l  Brief Hospital Course:  Patrick Durbin Clarkis a 67 y.o.malewho presented with lightheadedness and chest pain. PMH is significant forESRD on HD TTS, CHF, HTN, HLD, paroxysmal A.fib, Anemia of chronic disease, Venous stasis ulcers, T2DM.  His hospital course is outlined below.  Chest Pain On admission, Troponins mildly elevated, 0.03>0.03>0.07.  EKG without ST elevations.  Echo 10/1 showed EF 60-65% with G2DD.  Cardiology was consulted and did not recommend ischemic testing as chest pain was atypical with mildly flat troponins, EKG was nonacute, and they believed the suspicion for ACS to be low.  The patient had HD on 10/1 and more fluid was removed than usual.  His chest pain resolved following HD.  Day of discharge, he was much improved.  He was stable and medically cleared for discharge by managing teams.   Recent Fever/Lightheadedness On admission reported a fever at HD 2 days prior.  Blood cultures were drawn and received one dose of vancomycin and ceftazidime.  Blood cultures also drawn on admission.  White count WNL on admission  and he had been without fever since that time.  Decision was made to monitor off antibiotics as was likely 2/2 viral URI symptoms he was having at the time.   He was not hypotensive and had negative orthostatic vitals.  He remained afebrile throughout his admission and WBC remained WNL.  Blood cultures were negative >48 hours.  His lightheadedness and fever were likely 2/2 his viral URI.  Issues for Follow Up:  1. Bradycardia/HTN - held his coreg during admission and on discharge, may require adjustment to BP management 2. Will follow with cardiology as outpatient 3. Recommend continuation of outpatient physical therapy  Significant Procedures: Echo  Significant Labs and Imaging:  Recent Labs  Lab 09/10/18 0723 09/10/18 0743 09/11/18 0337 09/12/18 0443  WBC 5.7  --  8.1 7.2  HGB 10.8* 11.2* 10.5* 9.9*  HCT 34.2* 33.0* 32.8* 30.0*  PLT 66*  --  82* 92*   Recent Labs  Lab 09/10/18 0723 09/10/18 0743 09/10/18 1657 09/11/18 0826 09/12/18 0443  NA 138 138  --  138 133*  K 4.3 4.2  --  4.3 3.5  CL 96* 99  --  96* 93*  CO2 27  --   --  25 28  GLUCOSE 213* 215*  --  115* 108*  BUN 70* 65*  --  84* 36*  CREATININE 15.50* 16.20* 15.71* 16.35* 9.37*  CALCIUM 8.8*  --   --  8.8* 8.0*  PHOS  --   --   --  6.2* 4.2  ALBUMIN  --   --   --  3.1* 2.7*    Echo 09/11/18:  Study Conclusions - Left ventricle: The cavity size was normal. Wall thickness  was normal. Systolic function was normal. The estimated ejection fraction was in the range of 60% to 65%. Wall motion was normal; there were no regional wall motion abnormalities. Features are consistent with a pseudonormal left ventricular filling pattern, with concomitant abnormal relaxation and increased filling pressure (grade 2 diastolic dysfunction). - Mitral valve: There was mild regurgitation. - Left atrium: The atrium was mildly dilated. - Right atrium: The atrium was mildly dilated.  Results/Tests Pending at Time of  Discharge: Blood culture No growth >48hrs  Discharge Medications:  Allergies as of 09/12/2018      Reactions   Shrimp [shellfish Allergy] Swelling      Medication List    STOP taking these medications   carvedilol 6.25 MG tablet Commonly known as:  COREG   clotrimazole 1 % cream Commonly known as:  LOTRIMIN   predniSONE 20 MG tablet Commonly known as:  DELTASONE   urea 10 % lotion     TAKE these medications   acetaminophen 500 MG tablet Commonly known as:  TYLENOL Take 1,000 mg by mouth daily as needed for moderate pain or headache.   amLODipine 10 MG tablet Commonly known as:  NORVASC Take 10 mg by mouth at bedtime.   calcitRIOL 0.5 MCG capsule Commonly known as:  ROCALTROL Take 3 capsules (1.5 mcg total) by mouth Every Tuesday,Thursday,and Saturday with dialysis. Start taking on:  09/13/2018   cinacalcet 30 MG tablet Commonly known as:  SENSIPAR Take 2 tablets (60 mg total) by mouth every Tuesday, Thursday, and Saturday at 6 PM. Start taking on:  09/13/2018   furosemide 80 MG tablet Commonly known as:  LASIX Take 1 tablet (80 mg total) by mouth 2 (two) times daily.   multivitamin Tabs tablet Take 1 tablet by mouth at bedtime.       Discharge Instructions: Please refer to Patient Instructions section of EMR for full details.  Patient was counseled important signs and symptoms that should prompt return to medical care, changes in medications, dietary instructions, activity restrictions, and follow up appointments.   Follow-Up Appointments: Follow-up Information    [name], DO. Go on 09/19/2018.   Specialty:  Family Medicine Why:  at 11am Contact information: [address] [place] [address] [phone]           Cleophas Dunker, DO 09/12/2018, 7:05 PM PGY-1, [place]

## 2018-09-12 NOTE — Care Management Note (Signed)
Case Management Note  Patient Details  Name: RACIEL CAFFREY MRN: [id number] Date of Birth: 10-07-1951  Subjective/Objective:   Chest pain, Lightheadness, ESRD on TTS                 Action/Plan: Spoke to pt and he drives to his appts. Has a cane at home. Brother and girlfriend assist as needed.   Expected Discharge Date:                  Expected Discharge Plan:  Home/Self Care  In-House Referral:  NA  Discharge planning Services  CM Consult  Post Acute Care Choice:  NA Choice offered to:  NA  DME Arranged:  N/A DME Agency:  NA  HH Arranged:  NA HH Agency:  NA  Status of Service:  Completed, signed off  If discussed at [place] of Stay Meetings, dates discussed:    Additional Comments:  Erenest Rasher, RN 09/12/2018, 10:43 AM

## 2018-09-15 LAB — CULTURE, BLOOD (SINGLE): Culture: NO GROWTH

## 2018-09-19 ENCOUNTER — Inpatient Hospital Stay: Payer: Medicare Other

## 2018-09-19 NOTE — Progress Notes (Deleted)
  Subjective:   Patient ID: [name]    DOB: Mar 25, 1951, 67 y.o. male   MRN: [id number]  [name] is a 67 y.o. male with a history of HTN, CHF, afib, DM2, ESRD on HD, obesity, MGUS here for   Hospital follow up - Admitted 9/30 - 10/2 for chest pain. Suspicion for cardiac etiology was low given nonacute EKG and troponins. Chest pain resolved with HD. Also reported lightheadedness and recent fever at HD, thought to be due to viral illness as infectious workup was largely negative. - Coreg was held during admission and on discharge due to bradycardia. Will follow with Cardiology outpatient. Continues OP PT.  Review of Systems:  Per HPI.  [place], medications and smoking status reviewed.  Objective:   There were no vitals taken for this visit. Vitals and nursing note reviewed.  General: well nourished, well developed, in no acute distress with non-toxic appearance HEENT: normocephalic, atraumatic, moist mucous membranes Neck: supple, non-tender without lymphadenopathy CV: regular rate and rhythm without murmurs, rubs, or gallops, no lower extremity edema Lungs: clear to auscultation bilaterally with normal work of breathing Abdomen: soft, non-tender, non-distended, no masses or organomegaly palpable, normoactive bowel sounds Skin: warm, dry, no rashes or lesions Extremities: warm and well perfused, normal tone MSK: ROM grossly intact, strength intact, gait normal Neuro: Alert and oriented, speech normal  Assessment & Plan:   No problem-specific Assessment & Plan notes found for this encounter.  No orders of the defined types were placed in this encounter.  No orders of the defined types were placed in this encounter.   Patrick Percy, DO PGY-2, [facility] 09/19/2018 8:17 AM

## 2018-09-24 NOTE — Progress Notes (Signed)
Cardiology Office Note:    Date:  09/25/2018   ID:  [name], DOB 28-Jul-1951, MRN [id number]  PCP:  Richarda Osmond, DO  Cardiologist:  Minus Breeding, MD   Referring MD: Richarda Osmond, DO   Chief Complaint  Patient presents with  . Hospitalization Follow-up    dizziness    History of Present Illness:    [name] is a 67 y.o. male with a hx of HTN, paroxysmal atrial fibrillation, lymphedema, DM, ESRD on HD, chronic diastolic heart failure, anemia of chronic disease, venous stasis ulcers, and DM. He was recently hospitalized with chest pain. Cardiology was consulted, felt mild and flat troponin trend,. atypical chest pain, and nonacute EKG conferred a low suspicion for ACS. Echo with normal EF and grade 2 DD. He underwent HD with more fluid removed than usual and was discharged 09/12/18 with no further chest pain. Coreg was held for bradycardia.   He returns for hospital follow up. He has not had any dizziness or chest pressure/pain since his discharge. He thinks his dizziness was related to his coreg, but also states he was taking it only once per day instead of BID. He has felt well off of coreg, but notices his pressure is in the 814G systolic on the morning of HD days.  He would like to restart his coreg 6.25 mg that he takes on nights preceding HD days which results in a pressure in the 180-190s on the morning of HD.  He would like to restart his coreg prior to HD days. He has no other complaints. He does not want a flu shot.   Past Medical History:  Diagnosis Date  . Arthritis   . Asthma    as a child  . Chronic cystitis   . Chronic diastolic heart failure ([place])   . Chronic kidney disease    HD pt, 3 times a week.  . Diabetes mellitus   . Dysrhythmia   . GERD (gastroesophageal reflux disease)   . H/O hiatal hernia    states it's been fixed  . Hyperlipidemia   . Hypertension   . Lymphedema   . Morbid obesity ([place])   . NEPHROLITHIASIS, HX OF  12/02/2009   Qualifier: Diagnosis of  By: [name], Cat    . PVD (peripheral vascular disease) ([place])   . Renal insufficiency   . UMBILICAL HERNIA 07/29/5630   Qualifier: History of  By: [name] MD, Audelia Acton    . Venous insufficiency     Past Surgical History:  Procedure Laterality Date  . ABDOMINAL AORTOGRAM W/LOWER EXTREMITY N/A 05/19/2017   Procedure: Abdominal Aortogram w/Lower Extremity;  Surgeon: [name], MD;  Location: [facility];  Service: Cardiovascular;  Laterality: N/A;  . AMPUTATION     Right and left fifth toes.   . AMPUTATION TOE     emoval of both little toes  . AV FISTULA PLACEMENT Left 03/21/2014   Procedure: ARTERIOVENOUS (AV) FISTULA CREATION with ultrasound;  Surgeon: [name], MD;  Location: [facility];  Service: Vascular;  Laterality: Left;  . COLONOSCOPY    . EYE SURGERY Bilateral    cataract and lens implant  . HERNIA REPAIR    . LIGATION OF COMPETING BRANCHES OF ARTERIOVENOUS FISTULA Left 06/29/2015   Procedure: LIGATION OF LEFT ARM RADIOCEPHALIC ARTERIOVENOUS FISTULA SIDE BRANCHES;  Surgeon: Conrad Ashton, MD;  Location: [facility];  Service: Vascular;  Laterality: Left;  [name] MULTIPLE EXTRACTIONS WITH ALVEOLOPLASTY N/A 04/07/2017  Procedure: Extraction of tooth #'s 1-11, 13, 14,16, 20-23, and 26-28 with alveoloplasty;  Surgeon: Lenn Cal, DDS;  Location: [facility];  Service: Oral Surgery;  Laterality: N/A;  . Popliteal to posterior tibial bypass     2006  . R knee arthoscopic repair of meniscus    . UMBILICAL HERNIA REPAIR      Current Medications: Current Meds  Medication Sig  . acetaminophen (TYLENOL) 500 MG tablet Take 1,000 mg by mouth daily as needed for moderate pain or headache.  [name] amLODipine (NORVASC) 10 MG tablet Take 10 mg by mouth at bedtime.   . carvedilol (COREG) 6.25 MG tablet Take 6.25 mg by mouth.  . cinacalcet (SENSIPAR) 30 MG tablet Take 2 tablets (60 mg total) by mouth every Tuesday, Thursday, and Saturday at 6 PM.  . furosemide  (LASIX) 80 MG tablet Take 1 tablet (80 mg total) by mouth 2 (two) times daily.     Allergies:   Shrimp [shellfish allergy]   Social History   Socioeconomic History  . Marital status: Legally Separated    Spouse name: Not on file  . Number of children: 1  . Years of education: Not on file  . Highest education level: Not on file  Occupational History    Employer: DISABLED   Social Needs  . Financial resource strain: Not on file  . Food insecurity:    Worry: Not on file    Inability: Not on file  . Transportation needs:    Medical: Not on file    Non-medical: Not on file  Tobacco Use  . Smoking status: Former Smoker    Last attempt to quit: 06/06/1979    Years since quitting: 39.3  . Smokeless tobacco: Former Network engineer and Sexual Activity  . Alcohol use: No    Alcohol/week: 0.0 standard drinks    Comment:   "years ago" , none now  . Drug use: No  . Sexual activity: Never  Lifestyle  . Physical activity:    Days per week: Not on file    Minutes per session: Not on file  . Stress: Not on file  Relationships  . Social connections:    Talks on phone: Not on file    Gets together: Not on file    Attends religious service: Not on file    Active member of club or organization: Not on file    Attends meetings of clubs or organizations: Not on file    Relationship status: Not on file  Other Topics Concern  . Not on file  Social History Narrative   Lives alone.  Only son is deceased.      Family History: The patient's family history includes Diabetes in his brother and mother; Heart disease in his mother.  ROS:   Please see the history of present illness.     All other systems reviewed and are negative.  EKGs/Labs/Other Studies Reviewed:    The following studies were reviewed today:  Echo 09/11/18: Study Conclusions - Left ventricle: The cavity size was normal. Wall thickness was normal. Systolic function was normal. The estimated ejection fraction was  in the range of 60% to 65%. Wall motion was normal; there were no regional wall motion abnormalities. Features are consistent with a pseudonormal left ventricular filling pattern, with concomitant abnormal relaxation and increased filling pressure (grade 2 diastolic dysfunction). - Mitral valve: There was mild regurgitation. - Left atrium: The atrium was mildly dilated. - Right atrium: The atrium was mildly  dilated.   EKG:  EKG is not ordered today.   Recent Labs: 07/10/2018: ALT 18 09/12/2018: BUN 36; Creatinine, Ser 9.37; Hemoglobin 9.9; Platelets 92; Potassium 3.5; Sodium 133  Recent Lipid Panel    Component Value Date/Time   CHOL 184 03/13/2017 0908   TRIG 201 (H) 03/13/2017 0908   HDL 29 (L) 03/13/2017 0908   CHOLHDL 6.3 (H) 03/13/2017 0908   CHOLHDL 6.2 08/16/2012 1517   VLDL 50 (H) 08/16/2012 1517   LDLCALC 115 (H) 03/13/2017 0908    Physical Exam:    VS:  BP (!) 132/50   Pulse 70   Ht 5\' 11"  (1.803 m)   Wt 255 lb 6.4 oz (115.8 kg)   SpO2 96%   BMI 35.62 kg/m     Wt Readings from Last 3 Encounters:  09/25/18 255 lb 6.4 oz (115.8 kg)  09/12/18 251 lb 14.4 oz (114.3 kg)  07/14/17 263 lb (119.3 kg)     GEN:  Well nourished, well developed in no acute distress HEENT: Normal NECK: No JVD; No carotid bruits LYMPHATICS: bilateral lymphadenopathy lower extremities - wrapped CARDIAC: RRR, no murmurs, rubs, gallops RESPIRATORY:  Clear to auscultation without rales, wheezing or rhonchi  ABDOMEN: Soft, non-tender, non-distended MUSCULOSKELETAL:  B LE edema; No deformity  SKIN: Warm and dry NEUROLOGIC:  Alert and oriented x 3 PSYCHIATRIC:  Normal affect   ASSESSMENT:    1. Chronic diastolic heart failure ([place])   2. Essential hypertension   3. Lymphedema distichiasis syndrome with kidney disease and diabetes mellitus ([place])   4. ESRD on dialysis [facility])    PLAN:    In order of problems listed above:  Chronic diastolic heart failure ([place]) Pt coming from  HD today. He would like to restart coreg on the nights preceding HD, as he did before. I agree with this, unless he becomes dizzy again, then he will discontinue.   Essential hypertension Medication as above.   Lymphedema distichiasis syndrome with kidney disease and diabetes mellitus ([facility]) Legs are wrapped. Stable.  ESRD on dialysis [facility]) Compliant, coming from HD today. Euvolemic on exam.   Follow up in 6 months.   Medication Adjustments/Labs and Tests Ordered: Current medicines are reviewed at length with the patient today.  Concerns regarding medicines are outlined above.  No orders of the defined types were placed in this encounter.  No orders of the defined types were placed in this encounter.   Signed, Ledora Bottcher, PA  09/25/2018 2:07 PM    [facility]

## 2018-09-25 ENCOUNTER — Encounter: Payer: Self-pay | Admitting: Physician Assistant

## 2018-09-25 ENCOUNTER — Ambulatory Visit (INDEPENDENT_AMBULATORY_CARE_PROVIDER_SITE_OTHER): Payer: Medicare Other | Admitting: Physician Assistant

## 2018-09-25 VITALS — BP 132/50 | HR 70 | Ht 71.0 in | Wt 255.4 lb

## 2018-09-25 DIAGNOSIS — Q103 Other congenital malformations of eyelid: Secondary | ICD-10-CM

## 2018-09-25 DIAGNOSIS — N186 End stage renal disease: Secondary | ICD-10-CM

## 2018-09-25 DIAGNOSIS — I890 Lymphedema, not elsewhere classified: Secondary | ICD-10-CM

## 2018-09-25 DIAGNOSIS — I10 Essential (primary) hypertension: Secondary | ICD-10-CM | POA: Diagnosis not present

## 2018-09-25 DIAGNOSIS — I5032 Chronic diastolic (congestive) heart failure: Secondary | ICD-10-CM

## 2018-09-25 DIAGNOSIS — Z992 Dependence on renal dialysis: Secondary | ICD-10-CM

## 2018-09-25 DIAGNOSIS — N289 Disorder of kidney and ureter, unspecified: Secondary | ICD-10-CM

## 2018-09-25 DIAGNOSIS — E1129 Type 2 diabetes mellitus with other diabetic kidney complication: Secondary | ICD-10-CM

## 2018-09-25 NOTE — Patient Instructions (Signed)
Medication Instructions:  No Changes If you need a refill on your cardiac medications before your next appointment, please call your pharmacy.   Lab work: None Ordered. If you have labs (blood work) drawn today and your tests are completely normal, you will receive your results only by: [name] MyChart Message (if you have MyChart) OR . A paper copy in the mail If you have any lab test that is abnormal or we need to change your treatment, we will call you to review the results.  Testing/Procedures: None Ordered.  Follow-Up: At [facility], you and your health needs are our priority.  As part of our continuing mission to provide you with exceptional heart care, we have created designated Provider Care Teams.  These Care Teams include your primary Cardiologist (physician) and Advanced Practice Providers (APPs -  Physician Assistants and Nurse Practitioners) who all work together to provide you with the care you need, when you need it. You will need a follow up appointment in 6 months.  Please call our office 2 months in advance to schedule this appointment.  You may see Minus Breeding, MD or one of the following Advanced Practice Providers on your designated Care Team:   Rosaria Ferries, PA-C . Jory Sims, DNP, ANP  Any Other Special Instructions Will Be Listed Below (If Applicable). None.

## 2018-12-18 ENCOUNTER — Ambulatory Visit: Payer: Medicare Other | Admitting: Sports Medicine

## 2019-01-08 ENCOUNTER — Encounter: Payer: Self-pay | Admitting: Sports Medicine

## 2019-01-08 ENCOUNTER — Ambulatory Visit (INDEPENDENT_AMBULATORY_CARE_PROVIDER_SITE_OTHER): Payer: Medicare Other | Admitting: Sports Medicine

## 2019-01-08 DIAGNOSIS — N186 End stage renal disease: Secondary | ICD-10-CM | POA: Diagnosis not present

## 2019-01-08 DIAGNOSIS — Z992 Dependence on renal dialysis: Secondary | ICD-10-CM | POA: Diagnosis not present

## 2019-01-08 DIAGNOSIS — B351 Tinea unguium: Secondary | ICD-10-CM | POA: Diagnosis not present

## 2019-01-08 DIAGNOSIS — I890 Lymphedema, not elsewhere classified: Secondary | ICD-10-CM | POA: Diagnosis not present

## 2019-01-08 DIAGNOSIS — E1122 Type 2 diabetes mellitus with diabetic chronic kidney disease: Secondary | ICD-10-CM

## 2019-01-08 DIAGNOSIS — M79672 Pain in left foot: Secondary | ICD-10-CM

## 2019-01-08 DIAGNOSIS — M79671 Pain in right foot: Secondary | ICD-10-CM | POA: Diagnosis not present

## 2019-01-08 NOTE — Progress Notes (Signed)
Patient ID: [name], male   DOB: [birth year], 68 y.o.   MRN: [id number]  Subjective: [name] is a 68 y.o. male patient with history of type 2 diabetes who returns to office today complaining of long, painful nails  while ambulating in shoes; unable to trim. FBS not recorded. On HD 3x/wk.  Patient goes to wound care center for legs; being treated for lymphedema and ulceration with compression wraps to both legs. He changes dressings himself 2x weekly as previous but requests that we help with changing his compression wraps on today.   Patient Active Problem List   Diagnosis Date Noted  . Venous stasis   . History of endocarditis   . Bradycardia   . Orthostasis   . Chest pain 09/10/2018  . Hypovolemia associated with hemodialysis 07/11/2018  . Weakness   . Thrombocytopenia ([place]) 07/10/2018  . Paroxysmal atrial fibrillation ([place]) 05/26/2017  . Balanitis 04/25/2017  . Penile swelling   . Scrotal swelling   . UTI (urinary tract infection) 04/13/2017  . Anemia of chronic disease   . Acute encephalopathy 04/12/2017  . Urinary retention 04/10/2017  . Bacteremia   . RUQ abdominal pain   . Gross hematuria   . Other pancytopenia ([place])   . Sepsis ([place]) 03/21/2017  . Diarrhea 03/03/2017  . Retained lens material following cataract surgery of both eyes 11/08/2016  . Venous stasis ulcers of both lower extremities ([place]) 08/07/2015  . Varicose veins of lower extremities with complications 83/38/2505  . Malfunction of arteriovenous dialysis fistula ([place])   . CHF (congestive heart failure) ([place]) 06/23/2015  . Lymphedema distichiasis syndrome with kidney disease and diabetes mellitus ([place]) 04/06/2015  . Lymphedema of lower extremity 04/04/2015  . Type II diabetes mellitus with nephropathy ([place]) 04/04/2015  . MGUS (monoclonal gammopathy of unknown significance) 06/12/2014  . Chronic diastolic heart failure ([place]) 10/30/2013  . Special screening for malignant neoplasms, colon  07/09/2013  . Proliferative diabetic retinopathy ([place]) 10/23/2012  . Knee pain 08/21/2012  . Mass of thigh 08/21/2012  . FEVER UNSPECIFIED 05/06/2010  . ORGANIC IMPOTENCE 12/02/2009  . ONYCHOMYCOSIS 07/17/2009  . Lymphedema 05/06/2009  . ESRD on dialysis ([place]) 06/09/2008  . Hyperlipidemia 03/19/2007  . Class 2 severe obesity due to excess calories with serious comorbidity and body mass index (BMI) of 35.0 to 35.9 in adult ([name]) 02/08/2007  . Essential hypertension 02/08/2007  . Atherosclerosis of native arteries of extremity with intermittent claudication ([place]) 02/08/2007   Current Outpatient Medications on File Prior to Visit  Medication Sig Dispense Refill  . acetaminophen (TYLENOL) 500 MG tablet Take 1,000 mg by mouth daily as needed for moderate pain or headache.    [name] amLODipine (NORVASC) 10 MG tablet Take 10 mg by mouth at bedtime.     . carvedilol (COREG) 6.25 MG tablet Take 6.25 mg by mouth.    . cinacalcet (SENSIPAR) 30 MG tablet Take 2 tablets (60 mg total) by mouth every Tuesday, Thursday, and Saturday at 6 PM. 60 tablet 0  . furosemide (LASIX) 80 MG tablet Take 1 tablet (80 mg total) by mouth 2 (two) times daily. 60 tablet 2   No current facility-administered medications on file prior to visit.    Allergies  Allergen Reactions  . Shrimp [Shellfish Allergy] Swelling    No results found for this or any previous visit (from the past 2160 hour(s)).  Objective: General: Patient is awake, alert, and oriented x 3 and in no acute distress.  Integument: Skin is warm,  extremely dry and non-supple bilateral with trophic chronic edema changes and plaques of hyperpigmented and keratotic skin on both lower extremities with no open wounds. Nails are tender, long, thickened and dystrophic with subungual debris, consistent with onychomycosis, 1-4 bilateral. No signs of infection. No open lesions or preulcerative lesions present bilateral, diffuse callus sub met 1 with no signs of  infection.  Vasculature:  Dorsalis Pedis pulse 1/4 bilateral. Posterior Tibial pulse  0/4 bilateral. Capillary fill time <3 sec 1-4 bilateral. No hair growth to the level of the digits.Temperature gradient within normal limits. No varicosities present bilateral. Lymphedema present bilateral.   Neurology: The patient has diminished sensation measured with a 5.07/10g Semmes Weinstein Monofilament at all pedal sites bilateral . Vibratory sensation diminished bilateral with tuning fork. No Babinski sign present bilateral.   Musculoskeletal: 5th toe amputation status noted bilateral. Muscular strength 5/5 in all lower extremity muscular groups bilateral without pain on range of motion. No tenderness with calf compression bilateral.  Assessment and Plan: Problem List Items Addressed This Visit      Musculoskeletal and Integument   ONYCHOMYCOSIS - Primary     Other   Lymphedema of lower extremity    Other Visit Diagnoses    Type 2 diabetes mellitus with chronic kidney disease on chronic dialysis, without long-term current use of insulin (HCC)       Foot pain, bilateral         -Examined patient. -Discussed and educated patient on diabetic foot care, especially with  regards to the vascular, neurological and musculoskeletal systems.  -Stressed the importance of good glycemic control and the detriment of not  controlling glucose levels in relation to the foot. -Mechanically debrided all nails 1-4 bilateral using sterile nail nipper and filed with dremel without incident  -Reapply Unna boot to the level of the knee bilateral patient to continue with follow-up at wound care center and changing dressings as previous -Continue with Bag Balm or Okeeffee to feet daily  -Continue with Diabetic Shoes   -Answered all patient questions -Patient to return in 3 months for at risk foot care.  Landis Martins, DPM

## 2019-04-09 ENCOUNTER — Ambulatory Visit: Payer: Medicare Other | Admitting: Sports Medicine

## 2019-04-30 ENCOUNTER — Ambulatory Visit: Payer: Medicare Other | Admitting: Sports Medicine

## 2019-05-28 ENCOUNTER — Ambulatory Visit: Payer: Medicare Other | Admitting: Sports Medicine

## 2019-06-25 ENCOUNTER — Ambulatory Visit: Payer: Medicare Other | Admitting: Sports Medicine

## 2020-01-14 DIAGNOSIS — Z992 Dependence on renal dialysis: Secondary | ICD-10-CM | POA: Diagnosis not present

## 2020-01-14 DIAGNOSIS — R52 Pain, unspecified: Secondary | ICD-10-CM | POA: Diagnosis not present

## 2020-01-14 DIAGNOSIS — D688 Other specified coagulation defects: Secondary | ICD-10-CM | POA: Diagnosis not present

## 2020-01-14 DIAGNOSIS — E1151 Type 2 diabetes mellitus with diabetic peripheral angiopathy without gangrene: Secondary | ICD-10-CM | POA: Diagnosis not present

## 2020-01-14 DIAGNOSIS — N186 End stage renal disease: Secondary | ICD-10-CM | POA: Diagnosis not present

## 2020-01-14 DIAGNOSIS — D631 Anemia in chronic kidney disease: Secondary | ICD-10-CM | POA: Diagnosis not present

## 2020-01-14 DIAGNOSIS — L299 Pruritus, unspecified: Secondary | ICD-10-CM | POA: Diagnosis not present

## 2020-01-14 DIAGNOSIS — N2581 Secondary hyperparathyroidism of renal origin: Secondary | ICD-10-CM | POA: Diagnosis not present

## 2020-01-16 DIAGNOSIS — D631 Anemia in chronic kidney disease: Secondary | ICD-10-CM | POA: Diagnosis not present

## 2020-01-16 DIAGNOSIS — D688 Other specified coagulation defects: Secondary | ICD-10-CM | POA: Diagnosis not present

## 2020-01-16 DIAGNOSIS — L299 Pruritus, unspecified: Secondary | ICD-10-CM | POA: Diagnosis not present

## 2020-01-16 DIAGNOSIS — R52 Pain, unspecified: Secondary | ICD-10-CM | POA: Diagnosis not present

## 2020-01-16 DIAGNOSIS — N186 End stage renal disease: Secondary | ICD-10-CM | POA: Diagnosis not present

## 2020-01-16 DIAGNOSIS — E1151 Type 2 diabetes mellitus with diabetic peripheral angiopathy without gangrene: Secondary | ICD-10-CM | POA: Diagnosis not present

## 2020-01-16 DIAGNOSIS — N2581 Secondary hyperparathyroidism of renal origin: Secondary | ICD-10-CM | POA: Diagnosis not present

## 2020-01-16 DIAGNOSIS — Z992 Dependence on renal dialysis: Secondary | ICD-10-CM | POA: Diagnosis not present

## 2020-01-18 DIAGNOSIS — E1151 Type 2 diabetes mellitus with diabetic peripheral angiopathy without gangrene: Secondary | ICD-10-CM | POA: Diagnosis not present

## 2020-01-18 DIAGNOSIS — D688 Other specified coagulation defects: Secondary | ICD-10-CM | POA: Diagnosis not present

## 2020-01-18 DIAGNOSIS — D631 Anemia in chronic kidney disease: Secondary | ICD-10-CM | POA: Diagnosis not present

## 2020-01-18 DIAGNOSIS — Z992 Dependence on renal dialysis: Secondary | ICD-10-CM | POA: Diagnosis not present

## 2020-01-18 DIAGNOSIS — L299 Pruritus, unspecified: Secondary | ICD-10-CM | POA: Diagnosis not present

## 2020-01-18 DIAGNOSIS — N186 End stage renal disease: Secondary | ICD-10-CM | POA: Diagnosis not present

## 2020-01-18 DIAGNOSIS — R52 Pain, unspecified: Secondary | ICD-10-CM | POA: Diagnosis not present

## 2020-01-18 DIAGNOSIS — N2581 Secondary hyperparathyroidism of renal origin: Secondary | ICD-10-CM | POA: Diagnosis not present

## 2020-01-21 DIAGNOSIS — E1151 Type 2 diabetes mellitus with diabetic peripheral angiopathy without gangrene: Secondary | ICD-10-CM | POA: Diagnosis not present

## 2020-01-21 DIAGNOSIS — Z992 Dependence on renal dialysis: Secondary | ICD-10-CM | POA: Diagnosis not present

## 2020-01-21 DIAGNOSIS — D631 Anemia in chronic kidney disease: Secondary | ICD-10-CM | POA: Diagnosis not present

## 2020-01-21 DIAGNOSIS — D688 Other specified coagulation defects: Secondary | ICD-10-CM | POA: Diagnosis not present

## 2020-01-21 DIAGNOSIS — N2581 Secondary hyperparathyroidism of renal origin: Secondary | ICD-10-CM | POA: Diagnosis not present

## 2020-01-21 DIAGNOSIS — L299 Pruritus, unspecified: Secondary | ICD-10-CM | POA: Diagnosis not present

## 2020-01-21 DIAGNOSIS — R52 Pain, unspecified: Secondary | ICD-10-CM | POA: Diagnosis not present

## 2020-01-21 DIAGNOSIS — N186 End stage renal disease: Secondary | ICD-10-CM | POA: Diagnosis not present

## 2020-01-23 DIAGNOSIS — D688 Other specified coagulation defects: Secondary | ICD-10-CM | POA: Diagnosis not present

## 2020-01-23 DIAGNOSIS — D631 Anemia in chronic kidney disease: Secondary | ICD-10-CM | POA: Diagnosis not present

## 2020-01-23 DIAGNOSIS — N186 End stage renal disease: Secondary | ICD-10-CM | POA: Diagnosis not present

## 2020-01-23 DIAGNOSIS — R52 Pain, unspecified: Secondary | ICD-10-CM | POA: Diagnosis not present

## 2020-01-23 DIAGNOSIS — L299 Pruritus, unspecified: Secondary | ICD-10-CM | POA: Diagnosis not present

## 2020-01-23 DIAGNOSIS — E1151 Type 2 diabetes mellitus with diabetic peripheral angiopathy without gangrene: Secondary | ICD-10-CM | POA: Diagnosis not present

## 2020-01-23 DIAGNOSIS — Z992 Dependence on renal dialysis: Secondary | ICD-10-CM | POA: Diagnosis not present

## 2020-01-23 DIAGNOSIS — N2581 Secondary hyperparathyroidism of renal origin: Secondary | ICD-10-CM | POA: Diagnosis not present

## 2020-01-25 DIAGNOSIS — L299 Pruritus, unspecified: Secondary | ICD-10-CM | POA: Diagnosis not present

## 2020-01-25 DIAGNOSIS — Z992 Dependence on renal dialysis: Secondary | ICD-10-CM | POA: Diagnosis not present

## 2020-01-25 DIAGNOSIS — D688 Other specified coagulation defects: Secondary | ICD-10-CM | POA: Diagnosis not present

## 2020-01-25 DIAGNOSIS — N186 End stage renal disease: Secondary | ICD-10-CM | POA: Diagnosis not present

## 2020-01-25 DIAGNOSIS — R52 Pain, unspecified: Secondary | ICD-10-CM | POA: Diagnosis not present

## 2020-01-25 DIAGNOSIS — D631 Anemia in chronic kidney disease: Secondary | ICD-10-CM | POA: Diagnosis not present

## 2020-01-25 DIAGNOSIS — N2581 Secondary hyperparathyroidism of renal origin: Secondary | ICD-10-CM | POA: Diagnosis not present

## 2020-01-25 DIAGNOSIS — E1151 Type 2 diabetes mellitus with diabetic peripheral angiopathy without gangrene: Secondary | ICD-10-CM | POA: Diagnosis not present

## 2020-01-28 DIAGNOSIS — N186 End stage renal disease: Secondary | ICD-10-CM | POA: Diagnosis not present

## 2020-01-28 DIAGNOSIS — Z992 Dependence on renal dialysis: Secondary | ICD-10-CM | POA: Diagnosis not present

## 2020-01-28 DIAGNOSIS — N2581 Secondary hyperparathyroidism of renal origin: Secondary | ICD-10-CM | POA: Diagnosis not present

## 2020-01-28 DIAGNOSIS — R52 Pain, unspecified: Secondary | ICD-10-CM | POA: Diagnosis not present

## 2020-01-28 DIAGNOSIS — L299 Pruritus, unspecified: Secondary | ICD-10-CM | POA: Diagnosis not present

## 2020-01-28 DIAGNOSIS — D631 Anemia in chronic kidney disease: Secondary | ICD-10-CM | POA: Diagnosis not present

## 2020-01-28 DIAGNOSIS — E1151 Type 2 diabetes mellitus with diabetic peripheral angiopathy without gangrene: Secondary | ICD-10-CM | POA: Diagnosis not present

## 2020-01-28 DIAGNOSIS — D688 Other specified coagulation defects: Secondary | ICD-10-CM | POA: Diagnosis not present

## 2020-02-01 DIAGNOSIS — D688 Other specified coagulation defects: Secondary | ICD-10-CM | POA: Diagnosis not present

## 2020-02-01 DIAGNOSIS — E1151 Type 2 diabetes mellitus with diabetic peripheral angiopathy without gangrene: Secondary | ICD-10-CM | POA: Diagnosis not present

## 2020-02-01 DIAGNOSIS — D631 Anemia in chronic kidney disease: Secondary | ICD-10-CM | POA: Diagnosis not present

## 2020-02-01 DIAGNOSIS — Z992 Dependence on renal dialysis: Secondary | ICD-10-CM | POA: Diagnosis not present

## 2020-02-01 DIAGNOSIS — L299 Pruritus, unspecified: Secondary | ICD-10-CM | POA: Diagnosis not present

## 2020-02-01 DIAGNOSIS — N2581 Secondary hyperparathyroidism of renal origin: Secondary | ICD-10-CM | POA: Diagnosis not present

## 2020-02-01 DIAGNOSIS — N186 End stage renal disease: Secondary | ICD-10-CM | POA: Diagnosis not present

## 2020-02-01 DIAGNOSIS — R52 Pain, unspecified: Secondary | ICD-10-CM | POA: Diagnosis not present

## 2020-02-04 DIAGNOSIS — N186 End stage renal disease: Secondary | ICD-10-CM | POA: Diagnosis not present

## 2020-02-04 DIAGNOSIS — L299 Pruritus, unspecified: Secondary | ICD-10-CM | POA: Diagnosis not present

## 2020-02-04 DIAGNOSIS — Z992 Dependence on renal dialysis: Secondary | ICD-10-CM | POA: Diagnosis not present

## 2020-02-04 DIAGNOSIS — D631 Anemia in chronic kidney disease: Secondary | ICD-10-CM | POA: Diagnosis not present

## 2020-02-04 DIAGNOSIS — E1151 Type 2 diabetes mellitus with diabetic peripheral angiopathy without gangrene: Secondary | ICD-10-CM | POA: Diagnosis not present

## 2020-02-04 DIAGNOSIS — N2581 Secondary hyperparathyroidism of renal origin: Secondary | ICD-10-CM | POA: Diagnosis not present

## 2020-02-04 DIAGNOSIS — R52 Pain, unspecified: Secondary | ICD-10-CM | POA: Diagnosis not present

## 2020-02-04 DIAGNOSIS — D688 Other specified coagulation defects: Secondary | ICD-10-CM | POA: Diagnosis not present

## 2020-02-06 DIAGNOSIS — N186 End stage renal disease: Secondary | ICD-10-CM | POA: Diagnosis not present

## 2020-02-06 DIAGNOSIS — D631 Anemia in chronic kidney disease: Secondary | ICD-10-CM | POA: Diagnosis not present

## 2020-02-06 DIAGNOSIS — R52 Pain, unspecified: Secondary | ICD-10-CM | POA: Diagnosis not present

## 2020-02-06 DIAGNOSIS — Z992 Dependence on renal dialysis: Secondary | ICD-10-CM | POA: Diagnosis not present

## 2020-02-06 DIAGNOSIS — L299 Pruritus, unspecified: Secondary | ICD-10-CM | POA: Diagnosis not present

## 2020-02-06 DIAGNOSIS — N2581 Secondary hyperparathyroidism of renal origin: Secondary | ICD-10-CM | POA: Diagnosis not present

## 2020-02-06 DIAGNOSIS — E1151 Type 2 diabetes mellitus with diabetic peripheral angiopathy without gangrene: Secondary | ICD-10-CM | POA: Diagnosis not present

## 2020-02-06 DIAGNOSIS — D688 Other specified coagulation defects: Secondary | ICD-10-CM | POA: Diagnosis not present

## 2020-02-08 DIAGNOSIS — N2581 Secondary hyperparathyroidism of renal origin: Secondary | ICD-10-CM | POA: Diagnosis not present

## 2020-02-08 DIAGNOSIS — D688 Other specified coagulation defects: Secondary | ICD-10-CM | POA: Diagnosis not present

## 2020-02-08 DIAGNOSIS — D631 Anemia in chronic kidney disease: Secondary | ICD-10-CM | POA: Diagnosis not present

## 2020-02-08 DIAGNOSIS — Z992 Dependence on renal dialysis: Secondary | ICD-10-CM | POA: Diagnosis not present

## 2020-02-08 DIAGNOSIS — L299 Pruritus, unspecified: Secondary | ICD-10-CM | POA: Diagnosis not present

## 2020-02-08 DIAGNOSIS — E1151 Type 2 diabetes mellitus with diabetic peripheral angiopathy without gangrene: Secondary | ICD-10-CM | POA: Diagnosis not present

## 2020-02-08 DIAGNOSIS — R52 Pain, unspecified: Secondary | ICD-10-CM | POA: Diagnosis not present

## 2020-02-08 DIAGNOSIS — N186 End stage renal disease: Secondary | ICD-10-CM | POA: Diagnosis not present

## 2020-02-09 DIAGNOSIS — N186 End stage renal disease: Secondary | ICD-10-CM | POA: Diagnosis not present

## 2020-02-09 DIAGNOSIS — E1122 Type 2 diabetes mellitus with diabetic chronic kidney disease: Secondary | ICD-10-CM | POA: Diagnosis not present

## 2020-02-09 DIAGNOSIS — Z992 Dependence on renal dialysis: Secondary | ICD-10-CM | POA: Diagnosis not present

## 2020-02-11 DIAGNOSIS — E1151 Type 2 diabetes mellitus with diabetic peripheral angiopathy without gangrene: Secondary | ICD-10-CM | POA: Diagnosis not present

## 2020-02-11 DIAGNOSIS — N186 End stage renal disease: Secondary | ICD-10-CM | POA: Diagnosis not present

## 2020-02-11 DIAGNOSIS — N2581 Secondary hyperparathyroidism of renal origin: Secondary | ICD-10-CM | POA: Diagnosis not present

## 2020-02-11 DIAGNOSIS — R519 Headache, unspecified: Secondary | ICD-10-CM | POA: Diagnosis not present

## 2020-02-11 DIAGNOSIS — D509 Iron deficiency anemia, unspecified: Secondary | ICD-10-CM | POA: Diagnosis not present

## 2020-02-11 DIAGNOSIS — D688 Other specified coagulation defects: Secondary | ICD-10-CM | POA: Diagnosis not present

## 2020-02-11 DIAGNOSIS — Z992 Dependence on renal dialysis: Secondary | ICD-10-CM | POA: Diagnosis not present

## 2020-02-11 DIAGNOSIS — L299 Pruritus, unspecified: Secondary | ICD-10-CM | POA: Diagnosis not present

## 2020-02-11 DIAGNOSIS — D631 Anemia in chronic kidney disease: Secondary | ICD-10-CM | POA: Diagnosis not present

## 2020-02-13 DIAGNOSIS — N2581 Secondary hyperparathyroidism of renal origin: Secondary | ICD-10-CM | POA: Diagnosis not present

## 2020-02-13 DIAGNOSIS — D509 Iron deficiency anemia, unspecified: Secondary | ICD-10-CM | POA: Diagnosis not present

## 2020-02-13 DIAGNOSIS — D631 Anemia in chronic kidney disease: Secondary | ICD-10-CM | POA: Diagnosis not present

## 2020-02-13 DIAGNOSIS — N186 End stage renal disease: Secondary | ICD-10-CM | POA: Diagnosis not present

## 2020-02-13 DIAGNOSIS — E1151 Type 2 diabetes mellitus with diabetic peripheral angiopathy without gangrene: Secondary | ICD-10-CM | POA: Diagnosis not present

## 2020-02-13 DIAGNOSIS — D688 Other specified coagulation defects: Secondary | ICD-10-CM | POA: Diagnosis not present

## 2020-02-13 DIAGNOSIS — L299 Pruritus, unspecified: Secondary | ICD-10-CM | POA: Diagnosis not present

## 2020-02-13 DIAGNOSIS — R519 Headache, unspecified: Secondary | ICD-10-CM | POA: Diagnosis not present

## 2020-02-13 DIAGNOSIS — Z992 Dependence on renal dialysis: Secondary | ICD-10-CM | POA: Diagnosis not present

## 2020-02-15 DIAGNOSIS — D688 Other specified coagulation defects: Secondary | ICD-10-CM | POA: Diagnosis not present

## 2020-02-15 DIAGNOSIS — R519 Headache, unspecified: Secondary | ICD-10-CM | POA: Diagnosis not present

## 2020-02-15 DIAGNOSIS — E1151 Type 2 diabetes mellitus with diabetic peripheral angiopathy without gangrene: Secondary | ICD-10-CM | POA: Diagnosis not present

## 2020-02-15 DIAGNOSIS — Z992 Dependence on renal dialysis: Secondary | ICD-10-CM | POA: Diagnosis not present

## 2020-02-15 DIAGNOSIS — N2581 Secondary hyperparathyroidism of renal origin: Secondary | ICD-10-CM | POA: Diagnosis not present

## 2020-02-15 DIAGNOSIS — L299 Pruritus, unspecified: Secondary | ICD-10-CM | POA: Diagnosis not present

## 2020-02-15 DIAGNOSIS — N186 End stage renal disease: Secondary | ICD-10-CM | POA: Diagnosis not present

## 2020-02-15 DIAGNOSIS — D509 Iron deficiency anemia, unspecified: Secondary | ICD-10-CM | POA: Diagnosis not present

## 2020-02-15 DIAGNOSIS — D631 Anemia in chronic kidney disease: Secondary | ICD-10-CM | POA: Diagnosis not present

## 2020-02-18 DIAGNOSIS — D509 Iron deficiency anemia, unspecified: Secondary | ICD-10-CM | POA: Diagnosis not present

## 2020-02-18 DIAGNOSIS — N186 End stage renal disease: Secondary | ICD-10-CM | POA: Diagnosis not present

## 2020-02-18 DIAGNOSIS — Z992 Dependence on renal dialysis: Secondary | ICD-10-CM | POA: Diagnosis not present

## 2020-02-18 DIAGNOSIS — D631 Anemia in chronic kidney disease: Secondary | ICD-10-CM | POA: Diagnosis not present

## 2020-02-18 DIAGNOSIS — L299 Pruritus, unspecified: Secondary | ICD-10-CM | POA: Diagnosis not present

## 2020-02-18 DIAGNOSIS — N2581 Secondary hyperparathyroidism of renal origin: Secondary | ICD-10-CM | POA: Diagnosis not present

## 2020-02-18 DIAGNOSIS — E1151 Type 2 diabetes mellitus with diabetic peripheral angiopathy without gangrene: Secondary | ICD-10-CM | POA: Diagnosis not present

## 2020-02-18 DIAGNOSIS — R519 Headache, unspecified: Secondary | ICD-10-CM | POA: Diagnosis not present

## 2020-02-18 DIAGNOSIS — D688 Other specified coagulation defects: Secondary | ICD-10-CM | POA: Diagnosis not present

## 2020-02-20 DIAGNOSIS — D631 Anemia in chronic kidney disease: Secondary | ICD-10-CM | POA: Diagnosis not present

## 2020-02-20 DIAGNOSIS — N2581 Secondary hyperparathyroidism of renal origin: Secondary | ICD-10-CM | POA: Diagnosis not present

## 2020-02-20 DIAGNOSIS — N186 End stage renal disease: Secondary | ICD-10-CM | POA: Diagnosis not present

## 2020-02-20 DIAGNOSIS — Z992 Dependence on renal dialysis: Secondary | ICD-10-CM | POA: Diagnosis not present

## 2020-02-20 DIAGNOSIS — R519 Headache, unspecified: Secondary | ICD-10-CM | POA: Diagnosis not present

## 2020-02-20 DIAGNOSIS — L299 Pruritus, unspecified: Secondary | ICD-10-CM | POA: Diagnosis not present

## 2020-02-20 DIAGNOSIS — D509 Iron deficiency anemia, unspecified: Secondary | ICD-10-CM | POA: Diagnosis not present

## 2020-02-20 DIAGNOSIS — D688 Other specified coagulation defects: Secondary | ICD-10-CM | POA: Diagnosis not present

## 2020-02-20 DIAGNOSIS — E1151 Type 2 diabetes mellitus with diabetic peripheral angiopathy without gangrene: Secondary | ICD-10-CM | POA: Diagnosis not present

## 2020-02-22 DIAGNOSIS — D688 Other specified coagulation defects: Secondary | ICD-10-CM | POA: Diagnosis not present

## 2020-02-22 DIAGNOSIS — E1151 Type 2 diabetes mellitus with diabetic peripheral angiopathy without gangrene: Secondary | ICD-10-CM | POA: Diagnosis not present

## 2020-02-22 DIAGNOSIS — N2581 Secondary hyperparathyroidism of renal origin: Secondary | ICD-10-CM | POA: Diagnosis not present

## 2020-02-22 DIAGNOSIS — L299 Pruritus, unspecified: Secondary | ICD-10-CM | POA: Diagnosis not present

## 2020-02-22 DIAGNOSIS — R519 Headache, unspecified: Secondary | ICD-10-CM | POA: Diagnosis not present

## 2020-02-22 DIAGNOSIS — N186 End stage renal disease: Secondary | ICD-10-CM | POA: Diagnosis not present

## 2020-02-22 DIAGNOSIS — D631 Anemia in chronic kidney disease: Secondary | ICD-10-CM | POA: Diagnosis not present

## 2020-02-22 DIAGNOSIS — D509 Iron deficiency anemia, unspecified: Secondary | ICD-10-CM | POA: Diagnosis not present

## 2020-02-22 DIAGNOSIS — Z992 Dependence on renal dialysis: Secondary | ICD-10-CM | POA: Diagnosis not present

## 2020-02-24 DIAGNOSIS — J309 Allergic rhinitis, unspecified: Secondary | ICD-10-CM | POA: Diagnosis not present

## 2020-02-24 DIAGNOSIS — I5032 Chronic diastolic (congestive) heart failure: Secondary | ICD-10-CM | POA: Diagnosis not present

## 2020-02-24 DIAGNOSIS — I132 Hypertensive heart and chronic kidney disease with heart failure and with stage 5 chronic kidney disease, or end stage renal disease: Secondary | ICD-10-CM | POA: Diagnosis not present

## 2020-02-24 DIAGNOSIS — L02425 Furuncle of right lower limb: Secondary | ICD-10-CM | POA: Diagnosis not present

## 2020-02-25 DIAGNOSIS — D688 Other specified coagulation defects: Secondary | ICD-10-CM | POA: Diagnosis not present

## 2020-02-25 DIAGNOSIS — N186 End stage renal disease: Secondary | ICD-10-CM | POA: Diagnosis not present

## 2020-02-25 DIAGNOSIS — Z992 Dependence on renal dialysis: Secondary | ICD-10-CM | POA: Diagnosis not present

## 2020-02-25 DIAGNOSIS — R519 Headache, unspecified: Secondary | ICD-10-CM | POA: Diagnosis not present

## 2020-02-25 DIAGNOSIS — N2581 Secondary hyperparathyroidism of renal origin: Secondary | ICD-10-CM | POA: Diagnosis not present

## 2020-02-25 DIAGNOSIS — E1151 Type 2 diabetes mellitus with diabetic peripheral angiopathy without gangrene: Secondary | ICD-10-CM | POA: Diagnosis not present

## 2020-02-25 DIAGNOSIS — D631 Anemia in chronic kidney disease: Secondary | ICD-10-CM | POA: Diagnosis not present

## 2020-02-25 DIAGNOSIS — L299 Pruritus, unspecified: Secondary | ICD-10-CM | POA: Diagnosis not present

## 2020-02-25 DIAGNOSIS — D509 Iron deficiency anemia, unspecified: Secondary | ICD-10-CM | POA: Diagnosis not present

## 2020-02-27 DIAGNOSIS — N2581 Secondary hyperparathyroidism of renal origin: Secondary | ICD-10-CM | POA: Diagnosis not present

## 2020-02-27 DIAGNOSIS — D509 Iron deficiency anemia, unspecified: Secondary | ICD-10-CM | POA: Diagnosis not present

## 2020-02-27 DIAGNOSIS — D688 Other specified coagulation defects: Secondary | ICD-10-CM | POA: Diagnosis not present

## 2020-02-27 DIAGNOSIS — N186 End stage renal disease: Secondary | ICD-10-CM | POA: Diagnosis not present

## 2020-02-27 DIAGNOSIS — D631 Anemia in chronic kidney disease: Secondary | ICD-10-CM | POA: Diagnosis not present

## 2020-02-27 DIAGNOSIS — Z992 Dependence on renal dialysis: Secondary | ICD-10-CM | POA: Diagnosis not present

## 2020-02-27 DIAGNOSIS — L299 Pruritus, unspecified: Secondary | ICD-10-CM | POA: Diagnosis not present

## 2020-02-27 DIAGNOSIS — E1151 Type 2 diabetes mellitus with diabetic peripheral angiopathy without gangrene: Secondary | ICD-10-CM | POA: Diagnosis not present

## 2020-02-27 DIAGNOSIS — R519 Headache, unspecified: Secondary | ICD-10-CM | POA: Diagnosis not present

## 2020-02-29 DIAGNOSIS — L299 Pruritus, unspecified: Secondary | ICD-10-CM | POA: Diagnosis not present

## 2020-02-29 DIAGNOSIS — E1151 Type 2 diabetes mellitus with diabetic peripheral angiopathy without gangrene: Secondary | ICD-10-CM | POA: Diagnosis not present

## 2020-02-29 DIAGNOSIS — D631 Anemia in chronic kidney disease: Secondary | ICD-10-CM | POA: Diagnosis not present

## 2020-02-29 DIAGNOSIS — Z992 Dependence on renal dialysis: Secondary | ICD-10-CM | POA: Diagnosis not present

## 2020-02-29 DIAGNOSIS — R519 Headache, unspecified: Secondary | ICD-10-CM | POA: Diagnosis not present

## 2020-02-29 DIAGNOSIS — D509 Iron deficiency anemia, unspecified: Secondary | ICD-10-CM | POA: Diagnosis not present

## 2020-02-29 DIAGNOSIS — D688 Other specified coagulation defects: Secondary | ICD-10-CM | POA: Diagnosis not present

## 2020-02-29 DIAGNOSIS — N2581 Secondary hyperparathyroidism of renal origin: Secondary | ICD-10-CM | POA: Diagnosis not present

## 2020-02-29 DIAGNOSIS — N186 End stage renal disease: Secondary | ICD-10-CM | POA: Diagnosis not present

## 2020-03-03 DIAGNOSIS — D631 Anemia in chronic kidney disease: Secondary | ICD-10-CM | POA: Diagnosis not present

## 2020-03-03 DIAGNOSIS — L299 Pruritus, unspecified: Secondary | ICD-10-CM | POA: Diagnosis not present

## 2020-03-03 DIAGNOSIS — E1151 Type 2 diabetes mellitus with diabetic peripheral angiopathy without gangrene: Secondary | ICD-10-CM | POA: Diagnosis not present

## 2020-03-03 DIAGNOSIS — R519 Headache, unspecified: Secondary | ICD-10-CM | POA: Diagnosis not present

## 2020-03-03 DIAGNOSIS — D688 Other specified coagulation defects: Secondary | ICD-10-CM | POA: Diagnosis not present

## 2020-03-03 DIAGNOSIS — D509 Iron deficiency anemia, unspecified: Secondary | ICD-10-CM | POA: Diagnosis not present

## 2020-03-03 DIAGNOSIS — Z992 Dependence on renal dialysis: Secondary | ICD-10-CM | POA: Diagnosis not present

## 2020-03-03 DIAGNOSIS — N186 End stage renal disease: Secondary | ICD-10-CM | POA: Diagnosis not present

## 2020-03-03 DIAGNOSIS — N2581 Secondary hyperparathyroidism of renal origin: Secondary | ICD-10-CM | POA: Diagnosis not present

## 2020-03-05 DIAGNOSIS — D688 Other specified coagulation defects: Secondary | ICD-10-CM | POA: Diagnosis not present

## 2020-03-05 DIAGNOSIS — D631 Anemia in chronic kidney disease: Secondary | ICD-10-CM | POA: Diagnosis not present

## 2020-03-05 DIAGNOSIS — E1151 Type 2 diabetes mellitus with diabetic peripheral angiopathy without gangrene: Secondary | ICD-10-CM | POA: Diagnosis not present

## 2020-03-05 DIAGNOSIS — N2581 Secondary hyperparathyroidism of renal origin: Secondary | ICD-10-CM | POA: Diagnosis not present

## 2020-03-05 DIAGNOSIS — D509 Iron deficiency anemia, unspecified: Secondary | ICD-10-CM | POA: Diagnosis not present

## 2020-03-05 DIAGNOSIS — Z992 Dependence on renal dialysis: Secondary | ICD-10-CM | POA: Diagnosis not present

## 2020-03-05 DIAGNOSIS — L299 Pruritus, unspecified: Secondary | ICD-10-CM | POA: Diagnosis not present

## 2020-03-05 DIAGNOSIS — R519 Headache, unspecified: Secondary | ICD-10-CM | POA: Diagnosis not present

## 2020-03-05 DIAGNOSIS — N186 End stage renal disease: Secondary | ICD-10-CM | POA: Diagnosis not present

## 2020-03-07 DIAGNOSIS — N186 End stage renal disease: Secondary | ICD-10-CM | POA: Diagnosis not present

## 2020-03-07 DIAGNOSIS — E1151 Type 2 diabetes mellitus with diabetic peripheral angiopathy without gangrene: Secondary | ICD-10-CM | POA: Diagnosis not present

## 2020-03-07 DIAGNOSIS — R519 Headache, unspecified: Secondary | ICD-10-CM | POA: Diagnosis not present

## 2020-03-07 DIAGNOSIS — D631 Anemia in chronic kidney disease: Secondary | ICD-10-CM | POA: Diagnosis not present

## 2020-03-07 DIAGNOSIS — D688 Other specified coagulation defects: Secondary | ICD-10-CM | POA: Diagnosis not present

## 2020-03-07 DIAGNOSIS — Z992 Dependence on renal dialysis: Secondary | ICD-10-CM | POA: Diagnosis not present

## 2020-03-07 DIAGNOSIS — D509 Iron deficiency anemia, unspecified: Secondary | ICD-10-CM | POA: Diagnosis not present

## 2020-03-07 DIAGNOSIS — N2581 Secondary hyperparathyroidism of renal origin: Secondary | ICD-10-CM | POA: Diagnosis not present

## 2020-03-07 DIAGNOSIS — L299 Pruritus, unspecified: Secondary | ICD-10-CM | POA: Diagnosis not present

## 2020-03-10 DIAGNOSIS — E1151 Type 2 diabetes mellitus with diabetic peripheral angiopathy without gangrene: Secondary | ICD-10-CM | POA: Diagnosis not present

## 2020-03-10 DIAGNOSIS — D631 Anemia in chronic kidney disease: Secondary | ICD-10-CM | POA: Diagnosis not present

## 2020-03-10 DIAGNOSIS — D688 Other specified coagulation defects: Secondary | ICD-10-CM | POA: Diagnosis not present

## 2020-03-10 DIAGNOSIS — N186 End stage renal disease: Secondary | ICD-10-CM | POA: Diagnosis not present

## 2020-03-10 DIAGNOSIS — N2581 Secondary hyperparathyroidism of renal origin: Secondary | ICD-10-CM | POA: Diagnosis not present

## 2020-03-10 DIAGNOSIS — Z992 Dependence on renal dialysis: Secondary | ICD-10-CM | POA: Diagnosis not present

## 2020-03-10 DIAGNOSIS — L299 Pruritus, unspecified: Secondary | ICD-10-CM | POA: Diagnosis not present

## 2020-03-10 DIAGNOSIS — D509 Iron deficiency anemia, unspecified: Secondary | ICD-10-CM | POA: Diagnosis not present

## 2020-03-10 DIAGNOSIS — R519 Headache, unspecified: Secondary | ICD-10-CM | POA: Diagnosis not present

## 2020-03-11 DIAGNOSIS — N186 End stage renal disease: Secondary | ICD-10-CM | POA: Diagnosis not present

## 2020-03-11 DIAGNOSIS — Z992 Dependence on renal dialysis: Secondary | ICD-10-CM | POA: Diagnosis not present

## 2020-03-11 DIAGNOSIS — E1122 Type 2 diabetes mellitus with diabetic chronic kidney disease: Secondary | ICD-10-CM | POA: Diagnosis not present

## 2020-03-12 DIAGNOSIS — N186 End stage renal disease: Secondary | ICD-10-CM | POA: Diagnosis not present

## 2020-03-12 DIAGNOSIS — Z992 Dependence on renal dialysis: Secondary | ICD-10-CM | POA: Diagnosis not present

## 2020-03-12 DIAGNOSIS — L299 Pruritus, unspecified: Secondary | ICD-10-CM | POA: Diagnosis not present

## 2020-03-12 DIAGNOSIS — E1151 Type 2 diabetes mellitus with diabetic peripheral angiopathy without gangrene: Secondary | ICD-10-CM | POA: Diagnosis not present

## 2020-03-12 DIAGNOSIS — R52 Pain, unspecified: Secondary | ICD-10-CM | POA: Diagnosis not present

## 2020-03-12 DIAGNOSIS — D631 Anemia in chronic kidney disease: Secondary | ICD-10-CM | POA: Diagnosis not present

## 2020-03-12 DIAGNOSIS — N2581 Secondary hyperparathyroidism of renal origin: Secondary | ICD-10-CM | POA: Diagnosis not present

## 2020-03-12 DIAGNOSIS — D509 Iron deficiency anemia, unspecified: Secondary | ICD-10-CM | POA: Diagnosis not present

## 2020-03-12 DIAGNOSIS — D688 Other specified coagulation defects: Secondary | ICD-10-CM | POA: Diagnosis not present

## 2020-03-13 DIAGNOSIS — I872 Venous insufficiency (chronic) (peripheral): Secondary | ICD-10-CM | POA: Diagnosis not present

## 2020-03-13 DIAGNOSIS — E213 Hyperparathyroidism, unspecified: Secondary | ICD-10-CM | POA: Diagnosis not present

## 2020-03-13 DIAGNOSIS — I700 Atherosclerosis of aorta: Secondary | ICD-10-CM | POA: Diagnosis not present

## 2020-03-13 DIAGNOSIS — Z0001 Encounter for general adult medical examination with abnormal findings: Secondary | ICD-10-CM | POA: Diagnosis not present

## 2020-03-13 DIAGNOSIS — I509 Heart failure, unspecified: Secondary | ICD-10-CM | POA: Diagnosis not present

## 2020-03-13 DIAGNOSIS — N186 End stage renal disease: Secondary | ICD-10-CM | POA: Diagnosis not present

## 2020-03-13 DIAGNOSIS — E1122 Type 2 diabetes mellitus with diabetic chronic kidney disease: Secondary | ICD-10-CM | POA: Diagnosis not present

## 2020-03-13 DIAGNOSIS — E1151 Type 2 diabetes mellitus with diabetic peripheral angiopathy without gangrene: Secondary | ICD-10-CM | POA: Diagnosis not present

## 2020-03-14 DIAGNOSIS — E1151 Type 2 diabetes mellitus with diabetic peripheral angiopathy without gangrene: Secondary | ICD-10-CM | POA: Diagnosis not present

## 2020-03-14 DIAGNOSIS — R52 Pain, unspecified: Secondary | ICD-10-CM | POA: Diagnosis not present

## 2020-03-14 DIAGNOSIS — Z992 Dependence on renal dialysis: Secondary | ICD-10-CM | POA: Diagnosis not present

## 2020-03-14 DIAGNOSIS — D509 Iron deficiency anemia, unspecified: Secondary | ICD-10-CM | POA: Diagnosis not present

## 2020-03-14 DIAGNOSIS — D631 Anemia in chronic kidney disease: Secondary | ICD-10-CM | POA: Diagnosis not present

## 2020-03-14 DIAGNOSIS — L299 Pruritus, unspecified: Secondary | ICD-10-CM | POA: Diagnosis not present

## 2020-03-14 DIAGNOSIS — N186 End stage renal disease: Secondary | ICD-10-CM | POA: Diagnosis not present

## 2020-03-14 DIAGNOSIS — D688 Other specified coagulation defects: Secondary | ICD-10-CM | POA: Diagnosis not present

## 2020-03-14 DIAGNOSIS — N2581 Secondary hyperparathyroidism of renal origin: Secondary | ICD-10-CM | POA: Diagnosis not present

## 2020-03-17 DIAGNOSIS — N2581 Secondary hyperparathyroidism of renal origin: Secondary | ICD-10-CM | POA: Diagnosis not present

## 2020-03-17 DIAGNOSIS — Z992 Dependence on renal dialysis: Secondary | ICD-10-CM | POA: Diagnosis not present

## 2020-03-17 DIAGNOSIS — R52 Pain, unspecified: Secondary | ICD-10-CM | POA: Diagnosis not present

## 2020-03-17 DIAGNOSIS — N186 End stage renal disease: Secondary | ICD-10-CM | POA: Diagnosis not present

## 2020-03-17 DIAGNOSIS — D509 Iron deficiency anemia, unspecified: Secondary | ICD-10-CM | POA: Diagnosis not present

## 2020-03-17 DIAGNOSIS — L299 Pruritus, unspecified: Secondary | ICD-10-CM | POA: Diagnosis not present

## 2020-03-17 DIAGNOSIS — D631 Anemia in chronic kidney disease: Secondary | ICD-10-CM | POA: Diagnosis not present

## 2020-03-17 DIAGNOSIS — E1151 Type 2 diabetes mellitus with diabetic peripheral angiopathy without gangrene: Secondary | ICD-10-CM | POA: Diagnosis not present

## 2020-03-17 DIAGNOSIS — D688 Other specified coagulation defects: Secondary | ICD-10-CM | POA: Diagnosis not present

## 2020-03-19 DIAGNOSIS — E1151 Type 2 diabetes mellitus with diabetic peripheral angiopathy without gangrene: Secondary | ICD-10-CM | POA: Diagnosis not present

## 2020-03-19 DIAGNOSIS — N186 End stage renal disease: Secondary | ICD-10-CM | POA: Diagnosis not present

## 2020-03-19 DIAGNOSIS — D509 Iron deficiency anemia, unspecified: Secondary | ICD-10-CM | POA: Diagnosis not present

## 2020-03-19 DIAGNOSIS — Z992 Dependence on renal dialysis: Secondary | ICD-10-CM | POA: Diagnosis not present

## 2020-03-19 DIAGNOSIS — R52 Pain, unspecified: Secondary | ICD-10-CM | POA: Diagnosis not present

## 2020-03-19 DIAGNOSIS — N2581 Secondary hyperparathyroidism of renal origin: Secondary | ICD-10-CM | POA: Diagnosis not present

## 2020-03-19 DIAGNOSIS — D688 Other specified coagulation defects: Secondary | ICD-10-CM | POA: Diagnosis not present

## 2020-03-19 DIAGNOSIS — D631 Anemia in chronic kidney disease: Secondary | ICD-10-CM | POA: Diagnosis not present

## 2020-03-19 DIAGNOSIS — L299 Pruritus, unspecified: Secondary | ICD-10-CM | POA: Diagnosis not present

## 2020-03-21 DIAGNOSIS — D509 Iron deficiency anemia, unspecified: Secondary | ICD-10-CM | POA: Diagnosis not present

## 2020-03-21 DIAGNOSIS — L299 Pruritus, unspecified: Secondary | ICD-10-CM | POA: Diagnosis not present

## 2020-03-21 DIAGNOSIS — N186 End stage renal disease: Secondary | ICD-10-CM | POA: Diagnosis not present

## 2020-03-21 DIAGNOSIS — D631 Anemia in chronic kidney disease: Secondary | ICD-10-CM | POA: Diagnosis not present

## 2020-03-21 DIAGNOSIS — Z992 Dependence on renal dialysis: Secondary | ICD-10-CM | POA: Diagnosis not present

## 2020-03-21 DIAGNOSIS — E1151 Type 2 diabetes mellitus with diabetic peripheral angiopathy without gangrene: Secondary | ICD-10-CM | POA: Diagnosis not present

## 2020-03-21 DIAGNOSIS — D688 Other specified coagulation defects: Secondary | ICD-10-CM | POA: Diagnosis not present

## 2020-03-21 DIAGNOSIS — N2581 Secondary hyperparathyroidism of renal origin: Secondary | ICD-10-CM | POA: Diagnosis not present

## 2020-03-21 DIAGNOSIS — R52 Pain, unspecified: Secondary | ICD-10-CM | POA: Diagnosis not present

## 2020-03-24 DIAGNOSIS — D631 Anemia in chronic kidney disease: Secondary | ICD-10-CM | POA: Diagnosis not present

## 2020-03-24 DIAGNOSIS — L299 Pruritus, unspecified: Secondary | ICD-10-CM | POA: Diagnosis not present

## 2020-03-24 DIAGNOSIS — D509 Iron deficiency anemia, unspecified: Secondary | ICD-10-CM | POA: Diagnosis not present

## 2020-03-24 DIAGNOSIS — D688 Other specified coagulation defects: Secondary | ICD-10-CM | POA: Diagnosis not present

## 2020-03-24 DIAGNOSIS — Z992 Dependence on renal dialysis: Secondary | ICD-10-CM | POA: Diagnosis not present

## 2020-03-24 DIAGNOSIS — R52 Pain, unspecified: Secondary | ICD-10-CM | POA: Diagnosis not present

## 2020-03-24 DIAGNOSIS — N186 End stage renal disease: Secondary | ICD-10-CM | POA: Diagnosis not present

## 2020-03-24 DIAGNOSIS — N2581 Secondary hyperparathyroidism of renal origin: Secondary | ICD-10-CM | POA: Diagnosis not present

## 2020-03-24 DIAGNOSIS — E1151 Type 2 diabetes mellitus with diabetic peripheral angiopathy without gangrene: Secondary | ICD-10-CM | POA: Diagnosis not present

## 2020-03-26 DIAGNOSIS — N2581 Secondary hyperparathyroidism of renal origin: Secondary | ICD-10-CM | POA: Diagnosis not present

## 2020-03-26 DIAGNOSIS — Z992 Dependence on renal dialysis: Secondary | ICD-10-CM | POA: Diagnosis not present

## 2020-03-26 DIAGNOSIS — E1151 Type 2 diabetes mellitus with diabetic peripheral angiopathy without gangrene: Secondary | ICD-10-CM | POA: Diagnosis not present

## 2020-03-26 DIAGNOSIS — N186 End stage renal disease: Secondary | ICD-10-CM | POA: Diagnosis not present

## 2020-03-26 DIAGNOSIS — D509 Iron deficiency anemia, unspecified: Secondary | ICD-10-CM | POA: Diagnosis not present

## 2020-03-26 DIAGNOSIS — R52 Pain, unspecified: Secondary | ICD-10-CM | POA: Diagnosis not present

## 2020-03-26 DIAGNOSIS — D688 Other specified coagulation defects: Secondary | ICD-10-CM | POA: Diagnosis not present

## 2020-03-26 DIAGNOSIS — D631 Anemia in chronic kidney disease: Secondary | ICD-10-CM | POA: Diagnosis not present

## 2020-03-26 DIAGNOSIS — L299 Pruritus, unspecified: Secondary | ICD-10-CM | POA: Diagnosis not present

## 2020-03-28 DIAGNOSIS — D631 Anemia in chronic kidney disease: Secondary | ICD-10-CM | POA: Diagnosis not present

## 2020-03-28 DIAGNOSIS — D509 Iron deficiency anemia, unspecified: Secondary | ICD-10-CM | POA: Diagnosis not present

## 2020-03-28 DIAGNOSIS — L299 Pruritus, unspecified: Secondary | ICD-10-CM | POA: Diagnosis not present

## 2020-03-28 DIAGNOSIS — R52 Pain, unspecified: Secondary | ICD-10-CM | POA: Diagnosis not present

## 2020-03-28 DIAGNOSIS — E1151 Type 2 diabetes mellitus with diabetic peripheral angiopathy without gangrene: Secondary | ICD-10-CM | POA: Diagnosis not present

## 2020-03-28 DIAGNOSIS — D688 Other specified coagulation defects: Secondary | ICD-10-CM | POA: Diagnosis not present

## 2020-03-28 DIAGNOSIS — N2581 Secondary hyperparathyroidism of renal origin: Secondary | ICD-10-CM | POA: Diagnosis not present

## 2020-03-28 DIAGNOSIS — N186 End stage renal disease: Secondary | ICD-10-CM | POA: Diagnosis not present

## 2020-03-28 DIAGNOSIS — Z992 Dependence on renal dialysis: Secondary | ICD-10-CM | POA: Diagnosis not present

## 2020-03-31 DIAGNOSIS — N2581 Secondary hyperparathyroidism of renal origin: Secondary | ICD-10-CM | POA: Diagnosis not present

## 2020-03-31 DIAGNOSIS — E1151 Type 2 diabetes mellitus with diabetic peripheral angiopathy without gangrene: Secondary | ICD-10-CM | POA: Diagnosis not present

## 2020-03-31 DIAGNOSIS — L299 Pruritus, unspecified: Secondary | ICD-10-CM | POA: Diagnosis not present

## 2020-03-31 DIAGNOSIS — D688 Other specified coagulation defects: Secondary | ICD-10-CM | POA: Diagnosis not present

## 2020-03-31 DIAGNOSIS — Z992 Dependence on renal dialysis: Secondary | ICD-10-CM | POA: Diagnosis not present

## 2020-03-31 DIAGNOSIS — D509 Iron deficiency anemia, unspecified: Secondary | ICD-10-CM | POA: Diagnosis not present

## 2020-03-31 DIAGNOSIS — D631 Anemia in chronic kidney disease: Secondary | ICD-10-CM | POA: Diagnosis not present

## 2020-03-31 DIAGNOSIS — N186 End stage renal disease: Secondary | ICD-10-CM | POA: Diagnosis not present

## 2020-03-31 DIAGNOSIS — R52 Pain, unspecified: Secondary | ICD-10-CM | POA: Diagnosis not present

## 2020-04-02 DIAGNOSIS — R52 Pain, unspecified: Secondary | ICD-10-CM | POA: Diagnosis not present

## 2020-04-02 DIAGNOSIS — L299 Pruritus, unspecified: Secondary | ICD-10-CM | POA: Diagnosis not present

## 2020-04-02 DIAGNOSIS — D631 Anemia in chronic kidney disease: Secondary | ICD-10-CM | POA: Diagnosis not present

## 2020-04-02 DIAGNOSIS — N2581 Secondary hyperparathyroidism of renal origin: Secondary | ICD-10-CM | POA: Diagnosis not present

## 2020-04-02 DIAGNOSIS — D509 Iron deficiency anemia, unspecified: Secondary | ICD-10-CM | POA: Diagnosis not present

## 2020-04-02 DIAGNOSIS — Z992 Dependence on renal dialysis: Secondary | ICD-10-CM | POA: Diagnosis not present

## 2020-04-02 DIAGNOSIS — D688 Other specified coagulation defects: Secondary | ICD-10-CM | POA: Diagnosis not present

## 2020-04-02 DIAGNOSIS — N186 End stage renal disease: Secondary | ICD-10-CM | POA: Diagnosis not present

## 2020-04-02 DIAGNOSIS — E1151 Type 2 diabetes mellitus with diabetic peripheral angiopathy without gangrene: Secondary | ICD-10-CM | POA: Diagnosis not present

## 2020-04-04 DIAGNOSIS — D631 Anemia in chronic kidney disease: Secondary | ICD-10-CM | POA: Diagnosis not present

## 2020-04-04 DIAGNOSIS — R52 Pain, unspecified: Secondary | ICD-10-CM | POA: Diagnosis not present

## 2020-04-04 DIAGNOSIS — Z992 Dependence on renal dialysis: Secondary | ICD-10-CM | POA: Diagnosis not present

## 2020-04-04 DIAGNOSIS — N186 End stage renal disease: Secondary | ICD-10-CM | POA: Diagnosis not present

## 2020-04-04 DIAGNOSIS — D688 Other specified coagulation defects: Secondary | ICD-10-CM | POA: Diagnosis not present

## 2020-04-04 DIAGNOSIS — E1151 Type 2 diabetes mellitus with diabetic peripheral angiopathy without gangrene: Secondary | ICD-10-CM | POA: Diagnosis not present

## 2020-04-04 DIAGNOSIS — D509 Iron deficiency anemia, unspecified: Secondary | ICD-10-CM | POA: Diagnosis not present

## 2020-04-04 DIAGNOSIS — L299 Pruritus, unspecified: Secondary | ICD-10-CM | POA: Diagnosis not present

## 2020-04-04 DIAGNOSIS — N2581 Secondary hyperparathyroidism of renal origin: Secondary | ICD-10-CM | POA: Diagnosis not present

## 2020-04-07 DIAGNOSIS — N2581 Secondary hyperparathyroidism of renal origin: Secondary | ICD-10-CM | POA: Diagnosis not present

## 2020-04-07 DIAGNOSIS — N186 End stage renal disease: Secondary | ICD-10-CM | POA: Diagnosis not present

## 2020-04-07 DIAGNOSIS — D509 Iron deficiency anemia, unspecified: Secondary | ICD-10-CM | POA: Diagnosis not present

## 2020-04-07 DIAGNOSIS — L299 Pruritus, unspecified: Secondary | ICD-10-CM | POA: Diagnosis not present

## 2020-04-07 DIAGNOSIS — D631 Anemia in chronic kidney disease: Secondary | ICD-10-CM | POA: Diagnosis not present

## 2020-04-07 DIAGNOSIS — R52 Pain, unspecified: Secondary | ICD-10-CM | POA: Diagnosis not present

## 2020-04-07 DIAGNOSIS — Z992 Dependence on renal dialysis: Secondary | ICD-10-CM | POA: Diagnosis not present

## 2020-04-07 DIAGNOSIS — D688 Other specified coagulation defects: Secondary | ICD-10-CM | POA: Diagnosis not present

## 2020-04-07 DIAGNOSIS — E1151 Type 2 diabetes mellitus with diabetic peripheral angiopathy without gangrene: Secondary | ICD-10-CM | POA: Diagnosis not present

## 2020-04-09 DIAGNOSIS — E1151 Type 2 diabetes mellitus with diabetic peripheral angiopathy without gangrene: Secondary | ICD-10-CM | POA: Diagnosis not present

## 2020-04-09 DIAGNOSIS — N2581 Secondary hyperparathyroidism of renal origin: Secondary | ICD-10-CM | POA: Diagnosis not present

## 2020-04-09 DIAGNOSIS — D631 Anemia in chronic kidney disease: Secondary | ICD-10-CM | POA: Diagnosis not present

## 2020-04-09 DIAGNOSIS — D509 Iron deficiency anemia, unspecified: Secondary | ICD-10-CM | POA: Diagnosis not present

## 2020-04-09 DIAGNOSIS — Z992 Dependence on renal dialysis: Secondary | ICD-10-CM | POA: Diagnosis not present

## 2020-04-09 DIAGNOSIS — N186 End stage renal disease: Secondary | ICD-10-CM | POA: Diagnosis not present

## 2020-04-09 DIAGNOSIS — R52 Pain, unspecified: Secondary | ICD-10-CM | POA: Diagnosis not present

## 2020-04-09 DIAGNOSIS — L299 Pruritus, unspecified: Secondary | ICD-10-CM | POA: Diagnosis not present

## 2020-04-09 DIAGNOSIS — D688 Other specified coagulation defects: Secondary | ICD-10-CM | POA: Diagnosis not present

## 2020-04-10 DIAGNOSIS — Z992 Dependence on renal dialysis: Secondary | ICD-10-CM | POA: Diagnosis not present

## 2020-04-10 DIAGNOSIS — E1122 Type 2 diabetes mellitus with diabetic chronic kidney disease: Secondary | ICD-10-CM | POA: Diagnosis not present

## 2020-04-10 DIAGNOSIS — N186 End stage renal disease: Secondary | ICD-10-CM | POA: Diagnosis not present

## 2020-04-11 DIAGNOSIS — R519 Headache, unspecified: Secondary | ICD-10-CM | POA: Diagnosis not present

## 2020-04-11 DIAGNOSIS — Z992 Dependence on renal dialysis: Secondary | ICD-10-CM | POA: Diagnosis not present

## 2020-04-11 DIAGNOSIS — D688 Other specified coagulation defects: Secondary | ICD-10-CM | POA: Diagnosis not present

## 2020-04-11 DIAGNOSIS — D509 Iron deficiency anemia, unspecified: Secondary | ICD-10-CM | POA: Diagnosis not present

## 2020-04-11 DIAGNOSIS — D631 Anemia in chronic kidney disease: Secondary | ICD-10-CM | POA: Diagnosis not present

## 2020-04-11 DIAGNOSIS — N2581 Secondary hyperparathyroidism of renal origin: Secondary | ICD-10-CM | POA: Diagnosis not present

## 2020-04-11 DIAGNOSIS — L299 Pruritus, unspecified: Secondary | ICD-10-CM | POA: Diagnosis not present

## 2020-04-11 DIAGNOSIS — E1151 Type 2 diabetes mellitus with diabetic peripheral angiopathy without gangrene: Secondary | ICD-10-CM | POA: Diagnosis not present

## 2020-04-11 DIAGNOSIS — N186 End stage renal disease: Secondary | ICD-10-CM | POA: Diagnosis not present

## 2020-04-14 DIAGNOSIS — E1151 Type 2 diabetes mellitus with diabetic peripheral angiopathy without gangrene: Secondary | ICD-10-CM | POA: Diagnosis not present

## 2020-04-14 DIAGNOSIS — Z992 Dependence on renal dialysis: Secondary | ICD-10-CM | POA: Diagnosis not present

## 2020-04-14 DIAGNOSIS — N186 End stage renal disease: Secondary | ICD-10-CM | POA: Diagnosis not present

## 2020-04-14 DIAGNOSIS — D688 Other specified coagulation defects: Secondary | ICD-10-CM | POA: Diagnosis not present

## 2020-04-14 DIAGNOSIS — R519 Headache, unspecified: Secondary | ICD-10-CM | POA: Diagnosis not present

## 2020-04-14 DIAGNOSIS — L299 Pruritus, unspecified: Secondary | ICD-10-CM | POA: Diagnosis not present

## 2020-04-14 DIAGNOSIS — D509 Iron deficiency anemia, unspecified: Secondary | ICD-10-CM | POA: Diagnosis not present

## 2020-04-14 DIAGNOSIS — D631 Anemia in chronic kidney disease: Secondary | ICD-10-CM | POA: Diagnosis not present

## 2020-04-14 DIAGNOSIS — N2581 Secondary hyperparathyroidism of renal origin: Secondary | ICD-10-CM | POA: Diagnosis not present

## 2020-04-16 DIAGNOSIS — D631 Anemia in chronic kidney disease: Secondary | ICD-10-CM | POA: Diagnosis not present

## 2020-04-16 DIAGNOSIS — D509 Iron deficiency anemia, unspecified: Secondary | ICD-10-CM | POA: Diagnosis not present

## 2020-04-16 DIAGNOSIS — E1151 Type 2 diabetes mellitus with diabetic peripheral angiopathy without gangrene: Secondary | ICD-10-CM | POA: Diagnosis not present

## 2020-04-16 DIAGNOSIS — D688 Other specified coagulation defects: Secondary | ICD-10-CM | POA: Diagnosis not present

## 2020-04-16 DIAGNOSIS — R519 Headache, unspecified: Secondary | ICD-10-CM | POA: Diagnosis not present

## 2020-04-16 DIAGNOSIS — Z992 Dependence on renal dialysis: Secondary | ICD-10-CM | POA: Diagnosis not present

## 2020-04-16 DIAGNOSIS — N186 End stage renal disease: Secondary | ICD-10-CM | POA: Diagnosis not present

## 2020-04-16 DIAGNOSIS — L299 Pruritus, unspecified: Secondary | ICD-10-CM | POA: Diagnosis not present

## 2020-04-16 DIAGNOSIS — N2581 Secondary hyperparathyroidism of renal origin: Secondary | ICD-10-CM | POA: Diagnosis not present

## 2020-04-18 DIAGNOSIS — E1151 Type 2 diabetes mellitus with diabetic peripheral angiopathy without gangrene: Secondary | ICD-10-CM | POA: Diagnosis not present

## 2020-04-18 DIAGNOSIS — N186 End stage renal disease: Secondary | ICD-10-CM | POA: Diagnosis not present

## 2020-04-18 DIAGNOSIS — D631 Anemia in chronic kidney disease: Secondary | ICD-10-CM | POA: Diagnosis not present

## 2020-04-18 DIAGNOSIS — Z992 Dependence on renal dialysis: Secondary | ICD-10-CM | POA: Diagnosis not present

## 2020-04-18 DIAGNOSIS — D509 Iron deficiency anemia, unspecified: Secondary | ICD-10-CM | POA: Diagnosis not present

## 2020-04-18 DIAGNOSIS — R519 Headache, unspecified: Secondary | ICD-10-CM | POA: Diagnosis not present

## 2020-04-18 DIAGNOSIS — L299 Pruritus, unspecified: Secondary | ICD-10-CM | POA: Diagnosis not present

## 2020-04-18 DIAGNOSIS — N2581 Secondary hyperparathyroidism of renal origin: Secondary | ICD-10-CM | POA: Diagnosis not present

## 2020-04-18 DIAGNOSIS — D688 Other specified coagulation defects: Secondary | ICD-10-CM | POA: Diagnosis not present

## 2020-04-21 DIAGNOSIS — D688 Other specified coagulation defects: Secondary | ICD-10-CM | POA: Diagnosis not present

## 2020-04-21 DIAGNOSIS — R519 Headache, unspecified: Secondary | ICD-10-CM | POA: Diagnosis not present

## 2020-04-21 DIAGNOSIS — N186 End stage renal disease: Secondary | ICD-10-CM | POA: Diagnosis not present

## 2020-04-21 DIAGNOSIS — E1151 Type 2 diabetes mellitus with diabetic peripheral angiopathy without gangrene: Secondary | ICD-10-CM | POA: Diagnosis not present

## 2020-04-21 DIAGNOSIS — L299 Pruritus, unspecified: Secondary | ICD-10-CM | POA: Diagnosis not present

## 2020-04-21 DIAGNOSIS — Z992 Dependence on renal dialysis: Secondary | ICD-10-CM | POA: Diagnosis not present

## 2020-04-21 DIAGNOSIS — D631 Anemia in chronic kidney disease: Secondary | ICD-10-CM | POA: Diagnosis not present

## 2020-04-21 DIAGNOSIS — D509 Iron deficiency anemia, unspecified: Secondary | ICD-10-CM | POA: Diagnosis not present

## 2020-04-21 DIAGNOSIS — N2581 Secondary hyperparathyroidism of renal origin: Secondary | ICD-10-CM | POA: Diagnosis not present

## 2020-04-23 DIAGNOSIS — L299 Pruritus, unspecified: Secondary | ICD-10-CM | POA: Diagnosis not present

## 2020-04-23 DIAGNOSIS — D631 Anemia in chronic kidney disease: Secondary | ICD-10-CM | POA: Diagnosis not present

## 2020-04-23 DIAGNOSIS — D509 Iron deficiency anemia, unspecified: Secondary | ICD-10-CM | POA: Diagnosis not present

## 2020-04-23 DIAGNOSIS — D688 Other specified coagulation defects: Secondary | ICD-10-CM | POA: Diagnosis not present

## 2020-04-23 DIAGNOSIS — Z992 Dependence on renal dialysis: Secondary | ICD-10-CM | POA: Diagnosis not present

## 2020-04-23 DIAGNOSIS — N186 End stage renal disease: Secondary | ICD-10-CM | POA: Diagnosis not present

## 2020-04-23 DIAGNOSIS — E1151 Type 2 diabetes mellitus with diabetic peripheral angiopathy without gangrene: Secondary | ICD-10-CM | POA: Diagnosis not present

## 2020-04-23 DIAGNOSIS — R519 Headache, unspecified: Secondary | ICD-10-CM | POA: Diagnosis not present

## 2020-04-23 DIAGNOSIS — N2581 Secondary hyperparathyroidism of renal origin: Secondary | ICD-10-CM | POA: Diagnosis not present

## 2020-04-25 DIAGNOSIS — R519 Headache, unspecified: Secondary | ICD-10-CM | POA: Diagnosis not present

## 2020-04-25 DIAGNOSIS — Z992 Dependence on renal dialysis: Secondary | ICD-10-CM | POA: Diagnosis not present

## 2020-04-25 DIAGNOSIS — L299 Pruritus, unspecified: Secondary | ICD-10-CM | POA: Diagnosis not present

## 2020-04-25 DIAGNOSIS — D688 Other specified coagulation defects: Secondary | ICD-10-CM | POA: Diagnosis not present

## 2020-04-25 DIAGNOSIS — D509 Iron deficiency anemia, unspecified: Secondary | ICD-10-CM | POA: Diagnosis not present

## 2020-04-25 DIAGNOSIS — N2581 Secondary hyperparathyroidism of renal origin: Secondary | ICD-10-CM | POA: Diagnosis not present

## 2020-04-25 DIAGNOSIS — E1151 Type 2 diabetes mellitus with diabetic peripheral angiopathy without gangrene: Secondary | ICD-10-CM | POA: Diagnosis not present

## 2020-04-25 DIAGNOSIS — N186 End stage renal disease: Secondary | ICD-10-CM | POA: Diagnosis not present

## 2020-04-25 DIAGNOSIS — D631 Anemia in chronic kidney disease: Secondary | ICD-10-CM | POA: Diagnosis not present

## 2020-04-28 DIAGNOSIS — N186 End stage renal disease: Secondary | ICD-10-CM | POA: Diagnosis not present

## 2020-04-28 DIAGNOSIS — D631 Anemia in chronic kidney disease: Secondary | ICD-10-CM | POA: Diagnosis not present

## 2020-04-28 DIAGNOSIS — R519 Headache, unspecified: Secondary | ICD-10-CM | POA: Diagnosis not present

## 2020-04-28 DIAGNOSIS — L299 Pruritus, unspecified: Secondary | ICD-10-CM | POA: Diagnosis not present

## 2020-04-28 DIAGNOSIS — N2581 Secondary hyperparathyroidism of renal origin: Secondary | ICD-10-CM | POA: Diagnosis not present

## 2020-04-28 DIAGNOSIS — E1151 Type 2 diabetes mellitus with diabetic peripheral angiopathy without gangrene: Secondary | ICD-10-CM | POA: Diagnosis not present

## 2020-04-28 DIAGNOSIS — D509 Iron deficiency anemia, unspecified: Secondary | ICD-10-CM | POA: Diagnosis not present

## 2020-04-28 DIAGNOSIS — Z992 Dependence on renal dialysis: Secondary | ICD-10-CM | POA: Diagnosis not present

## 2020-04-28 DIAGNOSIS — D688 Other specified coagulation defects: Secondary | ICD-10-CM | POA: Diagnosis not present

## 2020-04-30 DIAGNOSIS — N2581 Secondary hyperparathyroidism of renal origin: Secondary | ICD-10-CM | POA: Diagnosis not present

## 2020-04-30 DIAGNOSIS — D509 Iron deficiency anemia, unspecified: Secondary | ICD-10-CM | POA: Diagnosis not present

## 2020-04-30 DIAGNOSIS — R519 Headache, unspecified: Secondary | ICD-10-CM | POA: Diagnosis not present

## 2020-04-30 DIAGNOSIS — D688 Other specified coagulation defects: Secondary | ICD-10-CM | POA: Diagnosis not present

## 2020-04-30 DIAGNOSIS — N186 End stage renal disease: Secondary | ICD-10-CM | POA: Diagnosis not present

## 2020-04-30 DIAGNOSIS — L299 Pruritus, unspecified: Secondary | ICD-10-CM | POA: Diagnosis not present

## 2020-04-30 DIAGNOSIS — Z992 Dependence on renal dialysis: Secondary | ICD-10-CM | POA: Diagnosis not present

## 2020-04-30 DIAGNOSIS — D631 Anemia in chronic kidney disease: Secondary | ICD-10-CM | POA: Diagnosis not present

## 2020-04-30 DIAGNOSIS — E1151 Type 2 diabetes mellitus with diabetic peripheral angiopathy without gangrene: Secondary | ICD-10-CM | POA: Diagnosis not present

## 2020-05-02 DIAGNOSIS — L299 Pruritus, unspecified: Secondary | ICD-10-CM | POA: Diagnosis not present

## 2020-05-02 DIAGNOSIS — D631 Anemia in chronic kidney disease: Secondary | ICD-10-CM | POA: Diagnosis not present

## 2020-05-02 DIAGNOSIS — D688 Other specified coagulation defects: Secondary | ICD-10-CM | POA: Diagnosis not present

## 2020-05-02 DIAGNOSIS — N2581 Secondary hyperparathyroidism of renal origin: Secondary | ICD-10-CM | POA: Diagnosis not present

## 2020-05-02 DIAGNOSIS — R519 Headache, unspecified: Secondary | ICD-10-CM | POA: Diagnosis not present

## 2020-05-02 DIAGNOSIS — E1151 Type 2 diabetes mellitus with diabetic peripheral angiopathy without gangrene: Secondary | ICD-10-CM | POA: Diagnosis not present

## 2020-05-02 DIAGNOSIS — D509 Iron deficiency anemia, unspecified: Secondary | ICD-10-CM | POA: Diagnosis not present

## 2020-05-02 DIAGNOSIS — Z992 Dependence on renal dialysis: Secondary | ICD-10-CM | POA: Diagnosis not present

## 2020-05-02 DIAGNOSIS — N186 End stage renal disease: Secondary | ICD-10-CM | POA: Diagnosis not present

## 2020-05-05 DIAGNOSIS — E1151 Type 2 diabetes mellitus with diabetic peripheral angiopathy without gangrene: Secondary | ICD-10-CM | POA: Diagnosis not present

## 2020-05-05 DIAGNOSIS — Z992 Dependence on renal dialysis: Secondary | ICD-10-CM | POA: Diagnosis not present

## 2020-05-05 DIAGNOSIS — D631 Anemia in chronic kidney disease: Secondary | ICD-10-CM | POA: Diagnosis not present

## 2020-05-05 DIAGNOSIS — D509 Iron deficiency anemia, unspecified: Secondary | ICD-10-CM | POA: Diagnosis not present

## 2020-05-05 DIAGNOSIS — R519 Headache, unspecified: Secondary | ICD-10-CM | POA: Diagnosis not present

## 2020-05-05 DIAGNOSIS — D688 Other specified coagulation defects: Secondary | ICD-10-CM | POA: Diagnosis not present

## 2020-05-05 DIAGNOSIS — L299 Pruritus, unspecified: Secondary | ICD-10-CM | POA: Diagnosis not present

## 2020-05-05 DIAGNOSIS — N2581 Secondary hyperparathyroidism of renal origin: Secondary | ICD-10-CM | POA: Diagnosis not present

## 2020-05-05 DIAGNOSIS — N186 End stage renal disease: Secondary | ICD-10-CM | POA: Diagnosis not present

## 2020-05-07 DIAGNOSIS — D631 Anemia in chronic kidney disease: Secondary | ICD-10-CM | POA: Diagnosis not present

## 2020-05-07 DIAGNOSIS — Z992 Dependence on renal dialysis: Secondary | ICD-10-CM | POA: Diagnosis not present

## 2020-05-07 DIAGNOSIS — E1151 Type 2 diabetes mellitus with diabetic peripheral angiopathy without gangrene: Secondary | ICD-10-CM | POA: Diagnosis not present

## 2020-05-07 DIAGNOSIS — D509 Iron deficiency anemia, unspecified: Secondary | ICD-10-CM | POA: Diagnosis not present

## 2020-05-07 DIAGNOSIS — N2581 Secondary hyperparathyroidism of renal origin: Secondary | ICD-10-CM | POA: Diagnosis not present

## 2020-05-07 DIAGNOSIS — N186 End stage renal disease: Secondary | ICD-10-CM | POA: Diagnosis not present

## 2020-05-07 DIAGNOSIS — D688 Other specified coagulation defects: Secondary | ICD-10-CM | POA: Diagnosis not present

## 2020-05-07 DIAGNOSIS — R519 Headache, unspecified: Secondary | ICD-10-CM | POA: Diagnosis not present

## 2020-05-07 DIAGNOSIS — L299 Pruritus, unspecified: Secondary | ICD-10-CM | POA: Diagnosis not present

## 2020-05-09 DIAGNOSIS — D688 Other specified coagulation defects: Secondary | ICD-10-CM | POA: Diagnosis not present

## 2020-05-09 DIAGNOSIS — Z992 Dependence on renal dialysis: Secondary | ICD-10-CM | POA: Diagnosis not present

## 2020-05-09 DIAGNOSIS — D631 Anemia in chronic kidney disease: Secondary | ICD-10-CM | POA: Diagnosis not present

## 2020-05-09 DIAGNOSIS — E1151 Type 2 diabetes mellitus with diabetic peripheral angiopathy without gangrene: Secondary | ICD-10-CM | POA: Diagnosis not present

## 2020-05-09 DIAGNOSIS — D509 Iron deficiency anemia, unspecified: Secondary | ICD-10-CM | POA: Diagnosis not present

## 2020-05-09 DIAGNOSIS — L299 Pruritus, unspecified: Secondary | ICD-10-CM | POA: Diagnosis not present

## 2020-05-09 DIAGNOSIS — R519 Headache, unspecified: Secondary | ICD-10-CM | POA: Diagnosis not present

## 2020-05-09 DIAGNOSIS — N2581 Secondary hyperparathyroidism of renal origin: Secondary | ICD-10-CM | POA: Diagnosis not present

## 2020-05-09 DIAGNOSIS — N186 End stage renal disease: Secondary | ICD-10-CM | POA: Diagnosis not present

## 2020-05-11 DIAGNOSIS — E1122 Type 2 diabetes mellitus with diabetic chronic kidney disease: Secondary | ICD-10-CM | POA: Diagnosis not present

## 2020-05-11 DIAGNOSIS — Z992 Dependence on renal dialysis: Secondary | ICD-10-CM | POA: Diagnosis not present

## 2020-05-11 DIAGNOSIS — N186 End stage renal disease: Secondary | ICD-10-CM | POA: Diagnosis not present

## 2020-05-12 DIAGNOSIS — L299 Pruritus, unspecified: Secondary | ICD-10-CM | POA: Diagnosis not present

## 2020-05-12 DIAGNOSIS — N2581 Secondary hyperparathyroidism of renal origin: Secondary | ICD-10-CM | POA: Diagnosis not present

## 2020-05-12 DIAGNOSIS — D688 Other specified coagulation defects: Secondary | ICD-10-CM | POA: Diagnosis not present

## 2020-05-12 DIAGNOSIS — D509 Iron deficiency anemia, unspecified: Secondary | ICD-10-CM | POA: Diagnosis not present

## 2020-05-12 DIAGNOSIS — R52 Pain, unspecified: Secondary | ICD-10-CM | POA: Diagnosis not present

## 2020-05-12 DIAGNOSIS — Z992 Dependence on renal dialysis: Secondary | ICD-10-CM | POA: Diagnosis not present

## 2020-05-12 DIAGNOSIS — E1151 Type 2 diabetes mellitus with diabetic peripheral angiopathy without gangrene: Secondary | ICD-10-CM | POA: Diagnosis not present

## 2020-05-12 DIAGNOSIS — N186 End stage renal disease: Secondary | ICD-10-CM | POA: Diagnosis not present

## 2020-05-14 DIAGNOSIS — L299 Pruritus, unspecified: Secondary | ICD-10-CM | POA: Diagnosis not present

## 2020-05-14 DIAGNOSIS — D509 Iron deficiency anemia, unspecified: Secondary | ICD-10-CM | POA: Diagnosis not present

## 2020-05-14 DIAGNOSIS — D688 Other specified coagulation defects: Secondary | ICD-10-CM | POA: Diagnosis not present

## 2020-05-14 DIAGNOSIS — N2581 Secondary hyperparathyroidism of renal origin: Secondary | ICD-10-CM | POA: Diagnosis not present

## 2020-05-14 DIAGNOSIS — N186 End stage renal disease: Secondary | ICD-10-CM | POA: Diagnosis not present

## 2020-05-14 DIAGNOSIS — R52 Pain, unspecified: Secondary | ICD-10-CM | POA: Diagnosis not present

## 2020-05-14 DIAGNOSIS — E1151 Type 2 diabetes mellitus with diabetic peripheral angiopathy without gangrene: Secondary | ICD-10-CM | POA: Diagnosis not present

## 2020-05-14 DIAGNOSIS — Z992 Dependence on renal dialysis: Secondary | ICD-10-CM | POA: Diagnosis not present

## 2020-05-16 DIAGNOSIS — D688 Other specified coagulation defects: Secondary | ICD-10-CM | POA: Diagnosis not present

## 2020-05-16 DIAGNOSIS — E1151 Type 2 diabetes mellitus with diabetic peripheral angiopathy without gangrene: Secondary | ICD-10-CM | POA: Diagnosis not present

## 2020-05-16 DIAGNOSIS — D509 Iron deficiency anemia, unspecified: Secondary | ICD-10-CM | POA: Diagnosis not present

## 2020-05-16 DIAGNOSIS — L299 Pruritus, unspecified: Secondary | ICD-10-CM | POA: Diagnosis not present

## 2020-05-16 DIAGNOSIS — N186 End stage renal disease: Secondary | ICD-10-CM | POA: Diagnosis not present

## 2020-05-16 DIAGNOSIS — Z992 Dependence on renal dialysis: Secondary | ICD-10-CM | POA: Diagnosis not present

## 2020-05-16 DIAGNOSIS — R52 Pain, unspecified: Secondary | ICD-10-CM | POA: Diagnosis not present

## 2020-05-16 DIAGNOSIS — N2581 Secondary hyperparathyroidism of renal origin: Secondary | ICD-10-CM | POA: Diagnosis not present

## 2020-05-19 DIAGNOSIS — E1151 Type 2 diabetes mellitus with diabetic peripheral angiopathy without gangrene: Secondary | ICD-10-CM | POA: Diagnosis not present

## 2020-05-19 DIAGNOSIS — D509 Iron deficiency anemia, unspecified: Secondary | ICD-10-CM | POA: Diagnosis not present

## 2020-05-19 DIAGNOSIS — N186 End stage renal disease: Secondary | ICD-10-CM | POA: Diagnosis not present

## 2020-05-19 DIAGNOSIS — D688 Other specified coagulation defects: Secondary | ICD-10-CM | POA: Diagnosis not present

## 2020-05-19 DIAGNOSIS — N2581 Secondary hyperparathyroidism of renal origin: Secondary | ICD-10-CM | POA: Diagnosis not present

## 2020-05-19 DIAGNOSIS — L299 Pruritus, unspecified: Secondary | ICD-10-CM | POA: Diagnosis not present

## 2020-05-19 DIAGNOSIS — Z992 Dependence on renal dialysis: Secondary | ICD-10-CM | POA: Diagnosis not present

## 2020-05-19 DIAGNOSIS — R52 Pain, unspecified: Secondary | ICD-10-CM | POA: Diagnosis not present

## 2020-05-21 DIAGNOSIS — R52 Pain, unspecified: Secondary | ICD-10-CM | POA: Diagnosis not present

## 2020-05-21 DIAGNOSIS — D509 Iron deficiency anemia, unspecified: Secondary | ICD-10-CM | POA: Diagnosis not present

## 2020-05-21 DIAGNOSIS — Z992 Dependence on renal dialysis: Secondary | ICD-10-CM | POA: Diagnosis not present

## 2020-05-21 DIAGNOSIS — L299 Pruritus, unspecified: Secondary | ICD-10-CM | POA: Diagnosis not present

## 2020-05-21 DIAGNOSIS — N186 End stage renal disease: Secondary | ICD-10-CM | POA: Diagnosis not present

## 2020-05-21 DIAGNOSIS — E1151 Type 2 diabetes mellitus with diabetic peripheral angiopathy without gangrene: Secondary | ICD-10-CM | POA: Diagnosis not present

## 2020-05-21 DIAGNOSIS — D688 Other specified coagulation defects: Secondary | ICD-10-CM | POA: Diagnosis not present

## 2020-05-21 DIAGNOSIS — N2581 Secondary hyperparathyroidism of renal origin: Secondary | ICD-10-CM | POA: Diagnosis not present

## 2020-05-23 DIAGNOSIS — D509 Iron deficiency anemia, unspecified: Secondary | ICD-10-CM | POA: Diagnosis not present

## 2020-05-23 DIAGNOSIS — N186 End stage renal disease: Secondary | ICD-10-CM | POA: Diagnosis not present

## 2020-05-23 DIAGNOSIS — E1151 Type 2 diabetes mellitus with diabetic peripheral angiopathy without gangrene: Secondary | ICD-10-CM | POA: Diagnosis not present

## 2020-05-23 DIAGNOSIS — L299 Pruritus, unspecified: Secondary | ICD-10-CM | POA: Diagnosis not present

## 2020-05-23 DIAGNOSIS — Z992 Dependence on renal dialysis: Secondary | ICD-10-CM | POA: Diagnosis not present

## 2020-05-23 DIAGNOSIS — D688 Other specified coagulation defects: Secondary | ICD-10-CM | POA: Diagnosis not present

## 2020-05-23 DIAGNOSIS — N2581 Secondary hyperparathyroidism of renal origin: Secondary | ICD-10-CM | POA: Diagnosis not present

## 2020-05-23 DIAGNOSIS — R52 Pain, unspecified: Secondary | ICD-10-CM | POA: Diagnosis not present

## 2020-05-26 DIAGNOSIS — Z992 Dependence on renal dialysis: Secondary | ICD-10-CM | POA: Diagnosis not present

## 2020-05-26 DIAGNOSIS — N186 End stage renal disease: Secondary | ICD-10-CM | POA: Diagnosis not present

## 2020-05-26 DIAGNOSIS — N2581 Secondary hyperparathyroidism of renal origin: Secondary | ICD-10-CM | POA: Diagnosis not present

## 2020-05-26 DIAGNOSIS — R52 Pain, unspecified: Secondary | ICD-10-CM | POA: Diagnosis not present

## 2020-05-26 DIAGNOSIS — D509 Iron deficiency anemia, unspecified: Secondary | ICD-10-CM | POA: Diagnosis not present

## 2020-05-26 DIAGNOSIS — E1151 Type 2 diabetes mellitus with diabetic peripheral angiopathy without gangrene: Secondary | ICD-10-CM | POA: Diagnosis not present

## 2020-05-26 DIAGNOSIS — L299 Pruritus, unspecified: Secondary | ICD-10-CM | POA: Diagnosis not present

## 2020-05-26 DIAGNOSIS — D688 Other specified coagulation defects: Secondary | ICD-10-CM | POA: Diagnosis not present

## 2020-05-28 DIAGNOSIS — Z992 Dependence on renal dialysis: Secondary | ICD-10-CM | POA: Diagnosis not present

## 2020-05-28 DIAGNOSIS — L299 Pruritus, unspecified: Secondary | ICD-10-CM | POA: Diagnosis not present

## 2020-05-28 DIAGNOSIS — N2581 Secondary hyperparathyroidism of renal origin: Secondary | ICD-10-CM | POA: Diagnosis not present

## 2020-05-28 DIAGNOSIS — E1151 Type 2 diabetes mellitus with diabetic peripheral angiopathy without gangrene: Secondary | ICD-10-CM | POA: Diagnosis not present

## 2020-05-28 DIAGNOSIS — D509 Iron deficiency anemia, unspecified: Secondary | ICD-10-CM | POA: Diagnosis not present

## 2020-05-28 DIAGNOSIS — R52 Pain, unspecified: Secondary | ICD-10-CM | POA: Diagnosis not present

## 2020-05-28 DIAGNOSIS — D688 Other specified coagulation defects: Secondary | ICD-10-CM | POA: Diagnosis not present

## 2020-05-28 DIAGNOSIS — N186 End stage renal disease: Secondary | ICD-10-CM | POA: Diagnosis not present

## 2020-05-30 DIAGNOSIS — Z992 Dependence on renal dialysis: Secondary | ICD-10-CM | POA: Diagnosis not present

## 2020-05-30 DIAGNOSIS — E1151 Type 2 diabetes mellitus with diabetic peripheral angiopathy without gangrene: Secondary | ICD-10-CM | POA: Diagnosis not present

## 2020-05-30 DIAGNOSIS — L299 Pruritus, unspecified: Secondary | ICD-10-CM | POA: Diagnosis not present

## 2020-05-30 DIAGNOSIS — N2581 Secondary hyperparathyroidism of renal origin: Secondary | ICD-10-CM | POA: Diagnosis not present

## 2020-05-30 DIAGNOSIS — N186 End stage renal disease: Secondary | ICD-10-CM | POA: Diagnosis not present

## 2020-05-30 DIAGNOSIS — D688 Other specified coagulation defects: Secondary | ICD-10-CM | POA: Diagnosis not present

## 2020-05-30 DIAGNOSIS — D509 Iron deficiency anemia, unspecified: Secondary | ICD-10-CM | POA: Diagnosis not present

## 2020-05-30 DIAGNOSIS — R52 Pain, unspecified: Secondary | ICD-10-CM | POA: Diagnosis not present

## 2020-06-02 DIAGNOSIS — Z992 Dependence on renal dialysis: Secondary | ICD-10-CM | POA: Diagnosis not present

## 2020-06-02 DIAGNOSIS — N186 End stage renal disease: Secondary | ICD-10-CM | POA: Diagnosis not present

## 2020-06-02 DIAGNOSIS — N2581 Secondary hyperparathyroidism of renal origin: Secondary | ICD-10-CM | POA: Diagnosis not present

## 2020-06-02 DIAGNOSIS — L299 Pruritus, unspecified: Secondary | ICD-10-CM | POA: Diagnosis not present

## 2020-06-02 DIAGNOSIS — R52 Pain, unspecified: Secondary | ICD-10-CM | POA: Diagnosis not present

## 2020-06-02 DIAGNOSIS — E1151 Type 2 diabetes mellitus with diabetic peripheral angiopathy without gangrene: Secondary | ICD-10-CM | POA: Diagnosis not present

## 2020-06-02 DIAGNOSIS — D688 Other specified coagulation defects: Secondary | ICD-10-CM | POA: Diagnosis not present

## 2020-06-02 DIAGNOSIS — D509 Iron deficiency anemia, unspecified: Secondary | ICD-10-CM | POA: Diagnosis not present

## 2020-06-04 DIAGNOSIS — N2581 Secondary hyperparathyroidism of renal origin: Secondary | ICD-10-CM | POA: Diagnosis not present

## 2020-06-04 DIAGNOSIS — D509 Iron deficiency anemia, unspecified: Secondary | ICD-10-CM | POA: Diagnosis not present

## 2020-06-04 DIAGNOSIS — D688 Other specified coagulation defects: Secondary | ICD-10-CM | POA: Diagnosis not present

## 2020-06-04 DIAGNOSIS — N186 End stage renal disease: Secondary | ICD-10-CM | POA: Diagnosis not present

## 2020-06-04 DIAGNOSIS — Z992 Dependence on renal dialysis: Secondary | ICD-10-CM | POA: Diagnosis not present

## 2020-06-04 DIAGNOSIS — E1151 Type 2 diabetes mellitus with diabetic peripheral angiopathy without gangrene: Secondary | ICD-10-CM | POA: Diagnosis not present

## 2020-06-04 DIAGNOSIS — L299 Pruritus, unspecified: Secondary | ICD-10-CM | POA: Diagnosis not present

## 2020-06-04 DIAGNOSIS — R52 Pain, unspecified: Secondary | ICD-10-CM | POA: Diagnosis not present

## 2020-06-06 DIAGNOSIS — E1151 Type 2 diabetes mellitus with diabetic peripheral angiopathy without gangrene: Secondary | ICD-10-CM | POA: Diagnosis not present

## 2020-06-06 DIAGNOSIS — N186 End stage renal disease: Secondary | ICD-10-CM | POA: Diagnosis not present

## 2020-06-06 DIAGNOSIS — L299 Pruritus, unspecified: Secondary | ICD-10-CM | POA: Diagnosis not present

## 2020-06-06 DIAGNOSIS — R52 Pain, unspecified: Secondary | ICD-10-CM | POA: Diagnosis not present

## 2020-06-06 DIAGNOSIS — N2581 Secondary hyperparathyroidism of renal origin: Secondary | ICD-10-CM | POA: Diagnosis not present

## 2020-06-06 DIAGNOSIS — Z992 Dependence on renal dialysis: Secondary | ICD-10-CM | POA: Diagnosis not present

## 2020-06-06 DIAGNOSIS — D509 Iron deficiency anemia, unspecified: Secondary | ICD-10-CM | POA: Diagnosis not present

## 2020-06-06 DIAGNOSIS — D688 Other specified coagulation defects: Secondary | ICD-10-CM | POA: Diagnosis not present

## 2020-06-09 DIAGNOSIS — D688 Other specified coagulation defects: Secondary | ICD-10-CM | POA: Diagnosis not present

## 2020-06-09 DIAGNOSIS — R52 Pain, unspecified: Secondary | ICD-10-CM | POA: Diagnosis not present

## 2020-06-09 DIAGNOSIS — E1151 Type 2 diabetes mellitus with diabetic peripheral angiopathy without gangrene: Secondary | ICD-10-CM | POA: Diagnosis not present

## 2020-06-09 DIAGNOSIS — N186 End stage renal disease: Secondary | ICD-10-CM | POA: Diagnosis not present

## 2020-06-09 DIAGNOSIS — D509 Iron deficiency anemia, unspecified: Secondary | ICD-10-CM | POA: Diagnosis not present

## 2020-06-09 DIAGNOSIS — N2581 Secondary hyperparathyroidism of renal origin: Secondary | ICD-10-CM | POA: Diagnosis not present

## 2020-06-09 DIAGNOSIS — Z992 Dependence on renal dialysis: Secondary | ICD-10-CM | POA: Diagnosis not present

## 2020-06-09 DIAGNOSIS — L299 Pruritus, unspecified: Secondary | ICD-10-CM | POA: Diagnosis not present

## 2020-06-10 DIAGNOSIS — Z992 Dependence on renal dialysis: Secondary | ICD-10-CM | POA: Diagnosis not present

## 2020-06-10 DIAGNOSIS — N186 End stage renal disease: Secondary | ICD-10-CM | POA: Diagnosis not present

## 2020-06-10 DIAGNOSIS — E1122 Type 2 diabetes mellitus with diabetic chronic kidney disease: Secondary | ICD-10-CM | POA: Diagnosis not present

## 2020-06-11 DIAGNOSIS — E1151 Type 2 diabetes mellitus with diabetic peripheral angiopathy without gangrene: Secondary | ICD-10-CM | POA: Diagnosis not present

## 2020-06-11 DIAGNOSIS — N186 End stage renal disease: Secondary | ICD-10-CM | POA: Diagnosis not present

## 2020-06-11 DIAGNOSIS — D509 Iron deficiency anemia, unspecified: Secondary | ICD-10-CM | POA: Diagnosis not present

## 2020-06-11 DIAGNOSIS — N2581 Secondary hyperparathyroidism of renal origin: Secondary | ICD-10-CM | POA: Diagnosis not present

## 2020-06-11 DIAGNOSIS — D631 Anemia in chronic kidney disease: Secondary | ICD-10-CM | POA: Diagnosis not present

## 2020-06-11 DIAGNOSIS — L299 Pruritus, unspecified: Secondary | ICD-10-CM | POA: Diagnosis not present

## 2020-06-11 DIAGNOSIS — Z992 Dependence on renal dialysis: Secondary | ICD-10-CM | POA: Diagnosis not present

## 2020-06-11 DIAGNOSIS — D688 Other specified coagulation defects: Secondary | ICD-10-CM | POA: Diagnosis not present

## 2020-06-13 DIAGNOSIS — Z992 Dependence on renal dialysis: Secondary | ICD-10-CM | POA: Diagnosis not present

## 2020-06-13 DIAGNOSIS — D509 Iron deficiency anemia, unspecified: Secondary | ICD-10-CM | POA: Diagnosis not present

## 2020-06-13 DIAGNOSIS — D688 Other specified coagulation defects: Secondary | ICD-10-CM | POA: Diagnosis not present

## 2020-06-13 DIAGNOSIS — L299 Pruritus, unspecified: Secondary | ICD-10-CM | POA: Diagnosis not present

## 2020-06-13 DIAGNOSIS — N2581 Secondary hyperparathyroidism of renal origin: Secondary | ICD-10-CM | POA: Diagnosis not present

## 2020-06-13 DIAGNOSIS — N186 End stage renal disease: Secondary | ICD-10-CM | POA: Diagnosis not present

## 2020-06-13 DIAGNOSIS — E1151 Type 2 diabetes mellitus with diabetic peripheral angiopathy without gangrene: Secondary | ICD-10-CM | POA: Diagnosis not present

## 2020-06-13 DIAGNOSIS — D631 Anemia in chronic kidney disease: Secondary | ICD-10-CM | POA: Diagnosis not present

## 2020-06-16 DIAGNOSIS — Z992 Dependence on renal dialysis: Secondary | ICD-10-CM | POA: Diagnosis not present

## 2020-06-16 DIAGNOSIS — E1151 Type 2 diabetes mellitus with diabetic peripheral angiopathy without gangrene: Secondary | ICD-10-CM | POA: Diagnosis not present

## 2020-06-16 DIAGNOSIS — N2581 Secondary hyperparathyroidism of renal origin: Secondary | ICD-10-CM | POA: Diagnosis not present

## 2020-06-16 DIAGNOSIS — D509 Iron deficiency anemia, unspecified: Secondary | ICD-10-CM | POA: Diagnosis not present

## 2020-06-16 DIAGNOSIS — D688 Other specified coagulation defects: Secondary | ICD-10-CM | POA: Diagnosis not present

## 2020-06-16 DIAGNOSIS — D631 Anemia in chronic kidney disease: Secondary | ICD-10-CM | POA: Diagnosis not present

## 2020-06-16 DIAGNOSIS — N186 End stage renal disease: Secondary | ICD-10-CM | POA: Diagnosis not present

## 2020-06-16 DIAGNOSIS — L299 Pruritus, unspecified: Secondary | ICD-10-CM | POA: Diagnosis not present

## 2020-06-18 DIAGNOSIS — N2581 Secondary hyperparathyroidism of renal origin: Secondary | ICD-10-CM | POA: Diagnosis not present

## 2020-06-18 DIAGNOSIS — D509 Iron deficiency anemia, unspecified: Secondary | ICD-10-CM | POA: Diagnosis not present

## 2020-06-18 DIAGNOSIS — L299 Pruritus, unspecified: Secondary | ICD-10-CM | POA: Diagnosis not present

## 2020-06-18 DIAGNOSIS — D631 Anemia in chronic kidney disease: Secondary | ICD-10-CM | POA: Diagnosis not present

## 2020-06-18 DIAGNOSIS — N186 End stage renal disease: Secondary | ICD-10-CM | POA: Diagnosis not present

## 2020-06-18 DIAGNOSIS — E1151 Type 2 diabetes mellitus with diabetic peripheral angiopathy without gangrene: Secondary | ICD-10-CM | POA: Diagnosis not present

## 2020-06-18 DIAGNOSIS — D688 Other specified coagulation defects: Secondary | ICD-10-CM | POA: Diagnosis not present

## 2020-06-18 DIAGNOSIS — Z992 Dependence on renal dialysis: Secondary | ICD-10-CM | POA: Diagnosis not present

## 2020-06-20 DIAGNOSIS — D509 Iron deficiency anemia, unspecified: Secondary | ICD-10-CM | POA: Diagnosis not present

## 2020-06-20 DIAGNOSIS — N186 End stage renal disease: Secondary | ICD-10-CM | POA: Diagnosis not present

## 2020-06-20 DIAGNOSIS — E1151 Type 2 diabetes mellitus with diabetic peripheral angiopathy without gangrene: Secondary | ICD-10-CM | POA: Diagnosis not present

## 2020-06-20 DIAGNOSIS — D631 Anemia in chronic kidney disease: Secondary | ICD-10-CM | POA: Diagnosis not present

## 2020-06-20 DIAGNOSIS — N2581 Secondary hyperparathyroidism of renal origin: Secondary | ICD-10-CM | POA: Diagnosis not present

## 2020-06-20 DIAGNOSIS — D688 Other specified coagulation defects: Secondary | ICD-10-CM | POA: Diagnosis not present

## 2020-06-20 DIAGNOSIS — Z992 Dependence on renal dialysis: Secondary | ICD-10-CM | POA: Diagnosis not present

## 2020-06-20 DIAGNOSIS — L299 Pruritus, unspecified: Secondary | ICD-10-CM | POA: Diagnosis not present

## 2020-06-23 DIAGNOSIS — D509 Iron deficiency anemia, unspecified: Secondary | ICD-10-CM | POA: Diagnosis not present

## 2020-06-23 DIAGNOSIS — D631 Anemia in chronic kidney disease: Secondary | ICD-10-CM | POA: Diagnosis not present

## 2020-06-23 DIAGNOSIS — N2581 Secondary hyperparathyroidism of renal origin: Secondary | ICD-10-CM | POA: Diagnosis not present

## 2020-06-23 DIAGNOSIS — E1151 Type 2 diabetes mellitus with diabetic peripheral angiopathy without gangrene: Secondary | ICD-10-CM | POA: Diagnosis not present

## 2020-06-23 DIAGNOSIS — D688 Other specified coagulation defects: Secondary | ICD-10-CM | POA: Diagnosis not present

## 2020-06-23 DIAGNOSIS — Z992 Dependence on renal dialysis: Secondary | ICD-10-CM | POA: Diagnosis not present

## 2020-06-23 DIAGNOSIS — L299 Pruritus, unspecified: Secondary | ICD-10-CM | POA: Diagnosis not present

## 2020-06-23 DIAGNOSIS — N186 End stage renal disease: Secondary | ICD-10-CM | POA: Diagnosis not present

## 2020-06-25 DIAGNOSIS — D509 Iron deficiency anemia, unspecified: Secondary | ICD-10-CM | POA: Diagnosis not present

## 2020-06-25 DIAGNOSIS — D631 Anemia in chronic kidney disease: Secondary | ICD-10-CM | POA: Diagnosis not present

## 2020-06-25 DIAGNOSIS — N186 End stage renal disease: Secondary | ICD-10-CM | POA: Diagnosis not present

## 2020-06-25 DIAGNOSIS — D688 Other specified coagulation defects: Secondary | ICD-10-CM | POA: Diagnosis not present

## 2020-06-25 DIAGNOSIS — N2581 Secondary hyperparathyroidism of renal origin: Secondary | ICD-10-CM | POA: Diagnosis not present

## 2020-06-25 DIAGNOSIS — L299 Pruritus, unspecified: Secondary | ICD-10-CM | POA: Diagnosis not present

## 2020-06-25 DIAGNOSIS — Z992 Dependence on renal dialysis: Secondary | ICD-10-CM | POA: Diagnosis not present

## 2020-06-25 DIAGNOSIS — E1151 Type 2 diabetes mellitus with diabetic peripheral angiopathy without gangrene: Secondary | ICD-10-CM | POA: Diagnosis not present

## 2020-06-27 DIAGNOSIS — D688 Other specified coagulation defects: Secondary | ICD-10-CM | POA: Diagnosis not present

## 2020-06-27 DIAGNOSIS — D631 Anemia in chronic kidney disease: Secondary | ICD-10-CM | POA: Diagnosis not present

## 2020-06-27 DIAGNOSIS — L299 Pruritus, unspecified: Secondary | ICD-10-CM | POA: Diagnosis not present

## 2020-06-27 DIAGNOSIS — D509 Iron deficiency anemia, unspecified: Secondary | ICD-10-CM | POA: Diagnosis not present

## 2020-06-27 DIAGNOSIS — Z992 Dependence on renal dialysis: Secondary | ICD-10-CM | POA: Diagnosis not present

## 2020-06-27 DIAGNOSIS — E1151 Type 2 diabetes mellitus with diabetic peripheral angiopathy without gangrene: Secondary | ICD-10-CM | POA: Diagnosis not present

## 2020-06-27 DIAGNOSIS — N186 End stage renal disease: Secondary | ICD-10-CM | POA: Diagnosis not present

## 2020-06-27 DIAGNOSIS — N2581 Secondary hyperparathyroidism of renal origin: Secondary | ICD-10-CM | POA: Diagnosis not present

## 2020-06-29 DIAGNOSIS — I5032 Chronic diastolic (congestive) heart failure: Secondary | ICD-10-CM | POA: Diagnosis not present

## 2020-06-29 DIAGNOSIS — I70212 Atherosclerosis of native arteries of extremities with intermittent claudication, left leg: Secondary | ICD-10-CM | POA: Diagnosis not present

## 2020-06-29 DIAGNOSIS — E1122 Type 2 diabetes mellitus with diabetic chronic kidney disease: Secondary | ICD-10-CM | POA: Diagnosis not present

## 2020-06-29 DIAGNOSIS — N186 End stage renal disease: Secondary | ICD-10-CM | POA: Diagnosis not present

## 2020-07-02 DIAGNOSIS — N186 End stage renal disease: Secondary | ICD-10-CM | POA: Diagnosis not present

## 2020-07-02 DIAGNOSIS — D631 Anemia in chronic kidney disease: Secondary | ICD-10-CM | POA: Diagnosis not present

## 2020-07-02 DIAGNOSIS — D688 Other specified coagulation defects: Secondary | ICD-10-CM | POA: Diagnosis not present

## 2020-07-02 DIAGNOSIS — D509 Iron deficiency anemia, unspecified: Secondary | ICD-10-CM | POA: Diagnosis not present

## 2020-07-02 DIAGNOSIS — N2581 Secondary hyperparathyroidism of renal origin: Secondary | ICD-10-CM | POA: Diagnosis not present

## 2020-07-02 DIAGNOSIS — Z992 Dependence on renal dialysis: Secondary | ICD-10-CM | POA: Diagnosis not present

## 2020-07-02 DIAGNOSIS — E1151 Type 2 diabetes mellitus with diabetic peripheral angiopathy without gangrene: Secondary | ICD-10-CM | POA: Diagnosis not present

## 2020-07-02 DIAGNOSIS — L299 Pruritus, unspecified: Secondary | ICD-10-CM | POA: Diagnosis not present

## 2020-07-04 DIAGNOSIS — Z992 Dependence on renal dialysis: Secondary | ICD-10-CM | POA: Diagnosis not present

## 2020-07-04 DIAGNOSIS — D631 Anemia in chronic kidney disease: Secondary | ICD-10-CM | POA: Diagnosis not present

## 2020-07-04 DIAGNOSIS — D688 Other specified coagulation defects: Secondary | ICD-10-CM | POA: Diagnosis not present

## 2020-07-04 DIAGNOSIS — E1151 Type 2 diabetes mellitus with diabetic peripheral angiopathy without gangrene: Secondary | ICD-10-CM | POA: Diagnosis not present

## 2020-07-04 DIAGNOSIS — N2581 Secondary hyperparathyroidism of renal origin: Secondary | ICD-10-CM | POA: Diagnosis not present

## 2020-07-04 DIAGNOSIS — N186 End stage renal disease: Secondary | ICD-10-CM | POA: Diagnosis not present

## 2020-07-04 DIAGNOSIS — L299 Pruritus, unspecified: Secondary | ICD-10-CM | POA: Diagnosis not present

## 2020-07-04 DIAGNOSIS — D509 Iron deficiency anemia, unspecified: Secondary | ICD-10-CM | POA: Diagnosis not present

## 2020-07-07 DIAGNOSIS — D688 Other specified coagulation defects: Secondary | ICD-10-CM | POA: Diagnosis not present

## 2020-07-07 DIAGNOSIS — N186 End stage renal disease: Secondary | ICD-10-CM | POA: Diagnosis not present

## 2020-07-07 DIAGNOSIS — N2581 Secondary hyperparathyroidism of renal origin: Secondary | ICD-10-CM | POA: Diagnosis not present

## 2020-07-07 DIAGNOSIS — L299 Pruritus, unspecified: Secondary | ICD-10-CM | POA: Diagnosis not present

## 2020-07-07 DIAGNOSIS — E1151 Type 2 diabetes mellitus with diabetic peripheral angiopathy without gangrene: Secondary | ICD-10-CM | POA: Diagnosis not present

## 2020-07-07 DIAGNOSIS — D631 Anemia in chronic kidney disease: Secondary | ICD-10-CM | POA: Diagnosis not present

## 2020-07-07 DIAGNOSIS — D509 Iron deficiency anemia, unspecified: Secondary | ICD-10-CM | POA: Diagnosis not present

## 2020-07-07 DIAGNOSIS — Z992 Dependence on renal dialysis: Secondary | ICD-10-CM | POA: Diagnosis not present

## 2020-07-09 DIAGNOSIS — L299 Pruritus, unspecified: Secondary | ICD-10-CM | POA: Diagnosis not present

## 2020-07-09 DIAGNOSIS — N186 End stage renal disease: Secondary | ICD-10-CM | POA: Diagnosis not present

## 2020-07-09 DIAGNOSIS — D631 Anemia in chronic kidney disease: Secondary | ICD-10-CM | POA: Diagnosis not present

## 2020-07-09 DIAGNOSIS — N2581 Secondary hyperparathyroidism of renal origin: Secondary | ICD-10-CM | POA: Diagnosis not present

## 2020-07-09 DIAGNOSIS — D509 Iron deficiency anemia, unspecified: Secondary | ICD-10-CM | POA: Diagnosis not present

## 2020-07-09 DIAGNOSIS — E1151 Type 2 diabetes mellitus with diabetic peripheral angiopathy without gangrene: Secondary | ICD-10-CM | POA: Diagnosis not present

## 2020-07-09 DIAGNOSIS — Z992 Dependence on renal dialysis: Secondary | ICD-10-CM | POA: Diagnosis not present

## 2020-07-09 DIAGNOSIS — D688 Other specified coagulation defects: Secondary | ICD-10-CM | POA: Diagnosis not present

## 2020-07-11 DIAGNOSIS — E1122 Type 2 diabetes mellitus with diabetic chronic kidney disease: Secondary | ICD-10-CM | POA: Diagnosis not present

## 2020-07-11 DIAGNOSIS — Z992 Dependence on renal dialysis: Secondary | ICD-10-CM | POA: Diagnosis not present

## 2020-07-11 DIAGNOSIS — N186 End stage renal disease: Secondary | ICD-10-CM | POA: Diagnosis not present

## 2020-07-14 DIAGNOSIS — E1151 Type 2 diabetes mellitus with diabetic peripheral angiopathy without gangrene: Secondary | ICD-10-CM | POA: Diagnosis not present

## 2020-07-14 DIAGNOSIS — D631 Anemia in chronic kidney disease: Secondary | ICD-10-CM | POA: Diagnosis not present

## 2020-07-14 DIAGNOSIS — N186 End stage renal disease: Secondary | ICD-10-CM | POA: Diagnosis not present

## 2020-07-14 DIAGNOSIS — L299 Pruritus, unspecified: Secondary | ICD-10-CM | POA: Diagnosis not present

## 2020-07-14 DIAGNOSIS — D688 Other specified coagulation defects: Secondary | ICD-10-CM | POA: Diagnosis not present

## 2020-07-14 DIAGNOSIS — Z992 Dependence on renal dialysis: Secondary | ICD-10-CM | POA: Diagnosis not present

## 2020-07-14 DIAGNOSIS — N2581 Secondary hyperparathyroidism of renal origin: Secondary | ICD-10-CM | POA: Diagnosis not present

## 2020-07-14 DIAGNOSIS — D509 Iron deficiency anemia, unspecified: Secondary | ICD-10-CM | POA: Diagnosis not present

## 2020-07-16 DIAGNOSIS — N2581 Secondary hyperparathyroidism of renal origin: Secondary | ICD-10-CM | POA: Diagnosis not present

## 2020-07-16 DIAGNOSIS — D509 Iron deficiency anemia, unspecified: Secondary | ICD-10-CM | POA: Diagnosis not present

## 2020-07-16 DIAGNOSIS — L299 Pruritus, unspecified: Secondary | ICD-10-CM | POA: Diagnosis not present

## 2020-07-16 DIAGNOSIS — N186 End stage renal disease: Secondary | ICD-10-CM | POA: Diagnosis not present

## 2020-07-16 DIAGNOSIS — Z992 Dependence on renal dialysis: Secondary | ICD-10-CM | POA: Diagnosis not present

## 2020-07-16 DIAGNOSIS — D631 Anemia in chronic kidney disease: Secondary | ICD-10-CM | POA: Diagnosis not present

## 2020-07-16 DIAGNOSIS — D688 Other specified coagulation defects: Secondary | ICD-10-CM | POA: Diagnosis not present

## 2020-07-16 DIAGNOSIS — E1151 Type 2 diabetes mellitus with diabetic peripheral angiopathy without gangrene: Secondary | ICD-10-CM | POA: Diagnosis not present

## 2020-07-18 DIAGNOSIS — N186 End stage renal disease: Secondary | ICD-10-CM | POA: Diagnosis not present

## 2020-07-18 DIAGNOSIS — D688 Other specified coagulation defects: Secondary | ICD-10-CM | POA: Diagnosis not present

## 2020-07-18 DIAGNOSIS — L299 Pruritus, unspecified: Secondary | ICD-10-CM | POA: Diagnosis not present

## 2020-07-18 DIAGNOSIS — E1151 Type 2 diabetes mellitus with diabetic peripheral angiopathy without gangrene: Secondary | ICD-10-CM | POA: Diagnosis not present

## 2020-07-18 DIAGNOSIS — D631 Anemia in chronic kidney disease: Secondary | ICD-10-CM | POA: Diagnosis not present

## 2020-07-18 DIAGNOSIS — N2581 Secondary hyperparathyroidism of renal origin: Secondary | ICD-10-CM | POA: Diagnosis not present

## 2020-07-18 DIAGNOSIS — D509 Iron deficiency anemia, unspecified: Secondary | ICD-10-CM | POA: Diagnosis not present

## 2020-07-18 DIAGNOSIS — Z992 Dependence on renal dialysis: Secondary | ICD-10-CM | POA: Diagnosis not present

## 2020-07-21 DIAGNOSIS — D688 Other specified coagulation defects: Secondary | ICD-10-CM | POA: Diagnosis not present

## 2020-07-21 DIAGNOSIS — L299 Pruritus, unspecified: Secondary | ICD-10-CM | POA: Diagnosis not present

## 2020-07-21 DIAGNOSIS — E1151 Type 2 diabetes mellitus with diabetic peripheral angiopathy without gangrene: Secondary | ICD-10-CM | POA: Diagnosis not present

## 2020-07-21 DIAGNOSIS — Z992 Dependence on renal dialysis: Secondary | ICD-10-CM | POA: Diagnosis not present

## 2020-07-21 DIAGNOSIS — D631 Anemia in chronic kidney disease: Secondary | ICD-10-CM | POA: Diagnosis not present

## 2020-07-21 DIAGNOSIS — D509 Iron deficiency anemia, unspecified: Secondary | ICD-10-CM | POA: Diagnosis not present

## 2020-07-21 DIAGNOSIS — N2581 Secondary hyperparathyroidism of renal origin: Secondary | ICD-10-CM | POA: Diagnosis not present

## 2020-07-21 DIAGNOSIS — N186 End stage renal disease: Secondary | ICD-10-CM | POA: Diagnosis not present

## 2020-07-23 DIAGNOSIS — Z992 Dependence on renal dialysis: Secondary | ICD-10-CM | POA: Diagnosis not present

## 2020-07-23 DIAGNOSIS — D509 Iron deficiency anemia, unspecified: Secondary | ICD-10-CM | POA: Diagnosis not present

## 2020-07-23 DIAGNOSIS — L299 Pruritus, unspecified: Secondary | ICD-10-CM | POA: Diagnosis not present

## 2020-07-23 DIAGNOSIS — E1151 Type 2 diabetes mellitus with diabetic peripheral angiopathy without gangrene: Secondary | ICD-10-CM | POA: Diagnosis not present

## 2020-07-23 DIAGNOSIS — N2581 Secondary hyperparathyroidism of renal origin: Secondary | ICD-10-CM | POA: Diagnosis not present

## 2020-07-23 DIAGNOSIS — D688 Other specified coagulation defects: Secondary | ICD-10-CM | POA: Diagnosis not present

## 2020-07-23 DIAGNOSIS — D631 Anemia in chronic kidney disease: Secondary | ICD-10-CM | POA: Diagnosis not present

## 2020-07-23 DIAGNOSIS — N186 End stage renal disease: Secondary | ICD-10-CM | POA: Diagnosis not present

## 2020-07-25 DIAGNOSIS — D688 Other specified coagulation defects: Secondary | ICD-10-CM | POA: Diagnosis not present

## 2020-07-25 DIAGNOSIS — D509 Iron deficiency anemia, unspecified: Secondary | ICD-10-CM | POA: Diagnosis not present

## 2020-07-25 DIAGNOSIS — E1151 Type 2 diabetes mellitus with diabetic peripheral angiopathy without gangrene: Secondary | ICD-10-CM | POA: Diagnosis not present

## 2020-07-25 DIAGNOSIS — N186 End stage renal disease: Secondary | ICD-10-CM | POA: Diagnosis not present

## 2020-07-25 DIAGNOSIS — D631 Anemia in chronic kidney disease: Secondary | ICD-10-CM | POA: Diagnosis not present

## 2020-07-25 DIAGNOSIS — Z992 Dependence on renal dialysis: Secondary | ICD-10-CM | POA: Diagnosis not present

## 2020-07-25 DIAGNOSIS — N2581 Secondary hyperparathyroidism of renal origin: Secondary | ICD-10-CM | POA: Diagnosis not present

## 2020-07-25 DIAGNOSIS — L299 Pruritus, unspecified: Secondary | ICD-10-CM | POA: Diagnosis not present

## 2020-07-28 DIAGNOSIS — D631 Anemia in chronic kidney disease: Secondary | ICD-10-CM | POA: Diagnosis not present

## 2020-07-28 DIAGNOSIS — Z992 Dependence on renal dialysis: Secondary | ICD-10-CM | POA: Diagnosis not present

## 2020-07-28 DIAGNOSIS — L299 Pruritus, unspecified: Secondary | ICD-10-CM | POA: Diagnosis not present

## 2020-07-28 DIAGNOSIS — D688 Other specified coagulation defects: Secondary | ICD-10-CM | POA: Diagnosis not present

## 2020-07-28 DIAGNOSIS — N2581 Secondary hyperparathyroidism of renal origin: Secondary | ICD-10-CM | POA: Diagnosis not present

## 2020-07-28 DIAGNOSIS — N186 End stage renal disease: Secondary | ICD-10-CM | POA: Diagnosis not present

## 2020-07-28 DIAGNOSIS — D509 Iron deficiency anemia, unspecified: Secondary | ICD-10-CM | POA: Diagnosis not present

## 2020-07-28 DIAGNOSIS — E1151 Type 2 diabetes mellitus with diabetic peripheral angiopathy without gangrene: Secondary | ICD-10-CM | POA: Diagnosis not present

## 2020-07-30 DIAGNOSIS — D688 Other specified coagulation defects: Secondary | ICD-10-CM | POA: Diagnosis not present

## 2020-07-30 DIAGNOSIS — N2581 Secondary hyperparathyroidism of renal origin: Secondary | ICD-10-CM | POA: Diagnosis not present

## 2020-07-30 DIAGNOSIS — L299 Pruritus, unspecified: Secondary | ICD-10-CM | POA: Diagnosis not present

## 2020-07-30 DIAGNOSIS — D509 Iron deficiency anemia, unspecified: Secondary | ICD-10-CM | POA: Diagnosis not present

## 2020-07-30 DIAGNOSIS — Z992 Dependence on renal dialysis: Secondary | ICD-10-CM | POA: Diagnosis not present

## 2020-07-30 DIAGNOSIS — D631 Anemia in chronic kidney disease: Secondary | ICD-10-CM | POA: Diagnosis not present

## 2020-07-30 DIAGNOSIS — N186 End stage renal disease: Secondary | ICD-10-CM | POA: Diagnosis not present

## 2020-07-30 DIAGNOSIS — E1151 Type 2 diabetes mellitus with diabetic peripheral angiopathy without gangrene: Secondary | ICD-10-CM | POA: Diagnosis not present

## 2020-08-01 DIAGNOSIS — D631 Anemia in chronic kidney disease: Secondary | ICD-10-CM | POA: Diagnosis not present

## 2020-08-01 DIAGNOSIS — D509 Iron deficiency anemia, unspecified: Secondary | ICD-10-CM | POA: Diagnosis not present

## 2020-08-01 DIAGNOSIS — E1151 Type 2 diabetes mellitus with diabetic peripheral angiopathy without gangrene: Secondary | ICD-10-CM | POA: Diagnosis not present

## 2020-08-01 DIAGNOSIS — N186 End stage renal disease: Secondary | ICD-10-CM | POA: Diagnosis not present

## 2020-08-01 DIAGNOSIS — D688 Other specified coagulation defects: Secondary | ICD-10-CM | POA: Diagnosis not present

## 2020-08-01 DIAGNOSIS — N2581 Secondary hyperparathyroidism of renal origin: Secondary | ICD-10-CM | POA: Diagnosis not present

## 2020-08-01 DIAGNOSIS — Z992 Dependence on renal dialysis: Secondary | ICD-10-CM | POA: Diagnosis not present

## 2020-08-01 DIAGNOSIS — L299 Pruritus, unspecified: Secondary | ICD-10-CM | POA: Diagnosis not present

## 2020-08-04 DIAGNOSIS — N2581 Secondary hyperparathyroidism of renal origin: Secondary | ICD-10-CM | POA: Diagnosis not present

## 2020-08-04 DIAGNOSIS — D509 Iron deficiency anemia, unspecified: Secondary | ICD-10-CM | POA: Diagnosis not present

## 2020-08-04 DIAGNOSIS — D688 Other specified coagulation defects: Secondary | ICD-10-CM | POA: Diagnosis not present

## 2020-08-04 DIAGNOSIS — E1151 Type 2 diabetes mellitus with diabetic peripheral angiopathy without gangrene: Secondary | ICD-10-CM | POA: Diagnosis not present

## 2020-08-04 DIAGNOSIS — D631 Anemia in chronic kidney disease: Secondary | ICD-10-CM | POA: Diagnosis not present

## 2020-08-04 DIAGNOSIS — Z992 Dependence on renal dialysis: Secondary | ICD-10-CM | POA: Diagnosis not present

## 2020-08-04 DIAGNOSIS — L299 Pruritus, unspecified: Secondary | ICD-10-CM | POA: Diagnosis not present

## 2020-08-04 DIAGNOSIS — N186 End stage renal disease: Secondary | ICD-10-CM | POA: Diagnosis not present

## 2020-08-06 DIAGNOSIS — N186 End stage renal disease: Secondary | ICD-10-CM | POA: Diagnosis not present

## 2020-08-06 DIAGNOSIS — E1151 Type 2 diabetes mellitus with diabetic peripheral angiopathy without gangrene: Secondary | ICD-10-CM | POA: Diagnosis not present

## 2020-08-06 DIAGNOSIS — D688 Other specified coagulation defects: Secondary | ICD-10-CM | POA: Diagnosis not present

## 2020-08-06 DIAGNOSIS — Z992 Dependence on renal dialysis: Secondary | ICD-10-CM | POA: Diagnosis not present

## 2020-08-06 DIAGNOSIS — D631 Anemia in chronic kidney disease: Secondary | ICD-10-CM | POA: Diagnosis not present

## 2020-08-06 DIAGNOSIS — D509 Iron deficiency anemia, unspecified: Secondary | ICD-10-CM | POA: Diagnosis not present

## 2020-08-06 DIAGNOSIS — N2581 Secondary hyperparathyroidism of renal origin: Secondary | ICD-10-CM | POA: Diagnosis not present

## 2020-08-06 DIAGNOSIS — L299 Pruritus, unspecified: Secondary | ICD-10-CM | POA: Diagnosis not present

## 2020-08-08 DIAGNOSIS — D688 Other specified coagulation defects: Secondary | ICD-10-CM | POA: Diagnosis not present

## 2020-08-08 DIAGNOSIS — D509 Iron deficiency anemia, unspecified: Secondary | ICD-10-CM | POA: Diagnosis not present

## 2020-08-08 DIAGNOSIS — Z992 Dependence on renal dialysis: Secondary | ICD-10-CM | POA: Diagnosis not present

## 2020-08-08 DIAGNOSIS — L299 Pruritus, unspecified: Secondary | ICD-10-CM | POA: Diagnosis not present

## 2020-08-08 DIAGNOSIS — D631 Anemia in chronic kidney disease: Secondary | ICD-10-CM | POA: Diagnosis not present

## 2020-08-08 DIAGNOSIS — N186 End stage renal disease: Secondary | ICD-10-CM | POA: Diagnosis not present

## 2020-08-08 DIAGNOSIS — N2581 Secondary hyperparathyroidism of renal origin: Secondary | ICD-10-CM | POA: Diagnosis not present

## 2020-08-08 DIAGNOSIS — E1151 Type 2 diabetes mellitus with diabetic peripheral angiopathy without gangrene: Secondary | ICD-10-CM | POA: Diagnosis not present

## 2020-08-11 DIAGNOSIS — L299 Pruritus, unspecified: Secondary | ICD-10-CM | POA: Diagnosis not present

## 2020-08-11 DIAGNOSIS — D631 Anemia in chronic kidney disease: Secondary | ICD-10-CM | POA: Diagnosis not present

## 2020-08-11 DIAGNOSIS — N2581 Secondary hyperparathyroidism of renal origin: Secondary | ICD-10-CM | POA: Diagnosis not present

## 2020-08-11 DIAGNOSIS — E1151 Type 2 diabetes mellitus with diabetic peripheral angiopathy without gangrene: Secondary | ICD-10-CM | POA: Diagnosis not present

## 2020-08-11 DIAGNOSIS — D509 Iron deficiency anemia, unspecified: Secondary | ICD-10-CM | POA: Diagnosis not present

## 2020-08-11 DIAGNOSIS — D688 Other specified coagulation defects: Secondary | ICD-10-CM | POA: Diagnosis not present

## 2020-08-11 DIAGNOSIS — E1122 Type 2 diabetes mellitus with diabetic chronic kidney disease: Secondary | ICD-10-CM | POA: Diagnosis not present

## 2020-08-11 DIAGNOSIS — Z992 Dependence on renal dialysis: Secondary | ICD-10-CM | POA: Diagnosis not present

## 2020-08-11 DIAGNOSIS — N186 End stage renal disease: Secondary | ICD-10-CM | POA: Diagnosis not present

## 2020-08-12 DIAGNOSIS — N186 End stage renal disease: Secondary | ICD-10-CM | POA: Diagnosis not present

## 2020-08-12 DIAGNOSIS — I5032 Chronic diastolic (congestive) heart failure: Secondary | ICD-10-CM | POA: Diagnosis not present

## 2020-08-12 DIAGNOSIS — R600 Localized edema: Secondary | ICD-10-CM | POA: Diagnosis not present

## 2020-08-12 DIAGNOSIS — E1122 Type 2 diabetes mellitus with diabetic chronic kidney disease: Secondary | ICD-10-CM | POA: Diagnosis not present

## 2020-08-15 DIAGNOSIS — D688 Other specified coagulation defects: Secondary | ICD-10-CM | POA: Diagnosis not present

## 2020-08-15 DIAGNOSIS — Z992 Dependence on renal dialysis: Secondary | ICD-10-CM | POA: Diagnosis not present

## 2020-08-15 DIAGNOSIS — D631 Anemia in chronic kidney disease: Secondary | ICD-10-CM | POA: Diagnosis not present

## 2020-08-15 DIAGNOSIS — N186 End stage renal disease: Secondary | ICD-10-CM | POA: Diagnosis not present

## 2020-08-15 DIAGNOSIS — L299 Pruritus, unspecified: Secondary | ICD-10-CM | POA: Diagnosis not present

## 2020-08-15 DIAGNOSIS — N2581 Secondary hyperparathyroidism of renal origin: Secondary | ICD-10-CM | POA: Diagnosis not present

## 2020-08-18 DIAGNOSIS — N2581 Secondary hyperparathyroidism of renal origin: Secondary | ICD-10-CM | POA: Diagnosis not present

## 2020-08-18 DIAGNOSIS — L299 Pruritus, unspecified: Secondary | ICD-10-CM | POA: Diagnosis not present

## 2020-08-18 DIAGNOSIS — Z992 Dependence on renal dialysis: Secondary | ICD-10-CM | POA: Diagnosis not present

## 2020-08-18 DIAGNOSIS — N186 End stage renal disease: Secondary | ICD-10-CM | POA: Diagnosis not present

## 2020-08-18 DIAGNOSIS — D688 Other specified coagulation defects: Secondary | ICD-10-CM | POA: Diagnosis not present

## 2020-08-18 DIAGNOSIS — D631 Anemia in chronic kidney disease: Secondary | ICD-10-CM | POA: Diagnosis not present

## 2020-08-20 DIAGNOSIS — L299 Pruritus, unspecified: Secondary | ICD-10-CM | POA: Diagnosis not present

## 2020-08-20 DIAGNOSIS — D688 Other specified coagulation defects: Secondary | ICD-10-CM | POA: Diagnosis not present

## 2020-08-20 DIAGNOSIS — N186 End stage renal disease: Secondary | ICD-10-CM | POA: Diagnosis not present

## 2020-08-20 DIAGNOSIS — D631 Anemia in chronic kidney disease: Secondary | ICD-10-CM | POA: Diagnosis not present

## 2020-08-20 DIAGNOSIS — N2581 Secondary hyperparathyroidism of renal origin: Secondary | ICD-10-CM | POA: Diagnosis not present

## 2020-08-20 DIAGNOSIS — Z992 Dependence on renal dialysis: Secondary | ICD-10-CM | POA: Diagnosis not present

## 2020-08-22 DIAGNOSIS — D631 Anemia in chronic kidney disease: Secondary | ICD-10-CM | POA: Diagnosis not present

## 2020-08-22 DIAGNOSIS — Z992 Dependence on renal dialysis: Secondary | ICD-10-CM | POA: Diagnosis not present

## 2020-08-22 DIAGNOSIS — N2581 Secondary hyperparathyroidism of renal origin: Secondary | ICD-10-CM | POA: Diagnosis not present

## 2020-08-22 DIAGNOSIS — N186 End stage renal disease: Secondary | ICD-10-CM | POA: Diagnosis not present

## 2020-08-22 DIAGNOSIS — D688 Other specified coagulation defects: Secondary | ICD-10-CM | POA: Diagnosis not present

## 2020-08-22 DIAGNOSIS — L299 Pruritus, unspecified: Secondary | ICD-10-CM | POA: Diagnosis not present

## 2020-08-25 DIAGNOSIS — N186 End stage renal disease: Secondary | ICD-10-CM | POA: Diagnosis not present

## 2020-08-25 DIAGNOSIS — L299 Pruritus, unspecified: Secondary | ICD-10-CM | POA: Diagnosis not present

## 2020-08-25 DIAGNOSIS — D631 Anemia in chronic kidney disease: Secondary | ICD-10-CM | POA: Diagnosis not present

## 2020-08-25 DIAGNOSIS — Z992 Dependence on renal dialysis: Secondary | ICD-10-CM | POA: Diagnosis not present

## 2020-08-25 DIAGNOSIS — N2581 Secondary hyperparathyroidism of renal origin: Secondary | ICD-10-CM | POA: Diagnosis not present

## 2020-08-25 DIAGNOSIS — D688 Other specified coagulation defects: Secondary | ICD-10-CM | POA: Diagnosis not present

## 2020-08-27 DIAGNOSIS — N186 End stage renal disease: Secondary | ICD-10-CM | POA: Diagnosis not present

## 2020-08-27 DIAGNOSIS — L299 Pruritus, unspecified: Secondary | ICD-10-CM | POA: Diagnosis not present

## 2020-08-27 DIAGNOSIS — N2581 Secondary hyperparathyroidism of renal origin: Secondary | ICD-10-CM | POA: Diagnosis not present

## 2020-08-27 DIAGNOSIS — D688 Other specified coagulation defects: Secondary | ICD-10-CM | POA: Diagnosis not present

## 2020-08-27 DIAGNOSIS — Z992 Dependence on renal dialysis: Secondary | ICD-10-CM | POA: Diagnosis not present

## 2020-08-27 DIAGNOSIS — D631 Anemia in chronic kidney disease: Secondary | ICD-10-CM | POA: Diagnosis not present

## 2020-08-29 DIAGNOSIS — L299 Pruritus, unspecified: Secondary | ICD-10-CM | POA: Diagnosis not present

## 2020-08-29 DIAGNOSIS — D688 Other specified coagulation defects: Secondary | ICD-10-CM | POA: Diagnosis not present

## 2020-08-29 DIAGNOSIS — Z992 Dependence on renal dialysis: Secondary | ICD-10-CM | POA: Diagnosis not present

## 2020-08-29 DIAGNOSIS — N2581 Secondary hyperparathyroidism of renal origin: Secondary | ICD-10-CM | POA: Diagnosis not present

## 2020-08-29 DIAGNOSIS — D631 Anemia in chronic kidney disease: Secondary | ICD-10-CM | POA: Diagnosis not present

## 2020-08-29 DIAGNOSIS — N186 End stage renal disease: Secondary | ICD-10-CM | POA: Diagnosis not present

## 2020-09-01 DIAGNOSIS — Z992 Dependence on renal dialysis: Secondary | ICD-10-CM | POA: Diagnosis not present

## 2020-09-01 DIAGNOSIS — D631 Anemia in chronic kidney disease: Secondary | ICD-10-CM | POA: Diagnosis not present

## 2020-09-01 DIAGNOSIS — L299 Pruritus, unspecified: Secondary | ICD-10-CM | POA: Diagnosis not present

## 2020-09-01 DIAGNOSIS — N186 End stage renal disease: Secondary | ICD-10-CM | POA: Diagnosis not present

## 2020-09-01 DIAGNOSIS — N2581 Secondary hyperparathyroidism of renal origin: Secondary | ICD-10-CM | POA: Diagnosis not present

## 2020-09-01 DIAGNOSIS — D688 Other specified coagulation defects: Secondary | ICD-10-CM | POA: Diagnosis not present

## 2020-09-03 DIAGNOSIS — L299 Pruritus, unspecified: Secondary | ICD-10-CM | POA: Diagnosis not present

## 2020-09-03 DIAGNOSIS — Z992 Dependence on renal dialysis: Secondary | ICD-10-CM | POA: Diagnosis not present

## 2020-09-03 DIAGNOSIS — N2581 Secondary hyperparathyroidism of renal origin: Secondary | ICD-10-CM | POA: Diagnosis not present

## 2020-09-03 DIAGNOSIS — N186 End stage renal disease: Secondary | ICD-10-CM | POA: Diagnosis not present

## 2020-09-03 DIAGNOSIS — D631 Anemia in chronic kidney disease: Secondary | ICD-10-CM | POA: Diagnosis not present

## 2020-09-03 DIAGNOSIS — D688 Other specified coagulation defects: Secondary | ICD-10-CM | POA: Diagnosis not present

## 2020-09-05 DIAGNOSIS — D631 Anemia in chronic kidney disease: Secondary | ICD-10-CM | POA: Diagnosis not present

## 2020-09-05 DIAGNOSIS — N2581 Secondary hyperparathyroidism of renal origin: Secondary | ICD-10-CM | POA: Diagnosis not present

## 2020-09-05 DIAGNOSIS — N186 End stage renal disease: Secondary | ICD-10-CM | POA: Diagnosis not present

## 2020-09-05 DIAGNOSIS — L299 Pruritus, unspecified: Secondary | ICD-10-CM | POA: Diagnosis not present

## 2020-09-05 DIAGNOSIS — Z992 Dependence on renal dialysis: Secondary | ICD-10-CM | POA: Diagnosis not present

## 2020-09-05 DIAGNOSIS — D688 Other specified coagulation defects: Secondary | ICD-10-CM | POA: Diagnosis not present

## 2020-09-08 DIAGNOSIS — D688 Other specified coagulation defects: Secondary | ICD-10-CM | POA: Diagnosis not present

## 2020-09-08 DIAGNOSIS — D631 Anemia in chronic kidney disease: Secondary | ICD-10-CM | POA: Diagnosis not present

## 2020-09-08 DIAGNOSIS — N186 End stage renal disease: Secondary | ICD-10-CM | POA: Diagnosis not present

## 2020-09-08 DIAGNOSIS — Z992 Dependence on renal dialysis: Secondary | ICD-10-CM | POA: Diagnosis not present

## 2020-09-08 DIAGNOSIS — L299 Pruritus, unspecified: Secondary | ICD-10-CM | POA: Diagnosis not present

## 2020-09-08 DIAGNOSIS — N2581 Secondary hyperparathyroidism of renal origin: Secondary | ICD-10-CM | POA: Diagnosis not present

## 2020-09-10 DIAGNOSIS — D631 Anemia in chronic kidney disease: Secondary | ICD-10-CM | POA: Diagnosis not present

## 2020-09-10 DIAGNOSIS — E1122 Type 2 diabetes mellitus with diabetic chronic kidney disease: Secondary | ICD-10-CM | POA: Diagnosis not present

## 2020-09-10 DIAGNOSIS — L299 Pruritus, unspecified: Secondary | ICD-10-CM | POA: Diagnosis not present

## 2020-09-10 DIAGNOSIS — D688 Other specified coagulation defects: Secondary | ICD-10-CM | POA: Diagnosis not present

## 2020-09-10 DIAGNOSIS — Z992 Dependence on renal dialysis: Secondary | ICD-10-CM | POA: Diagnosis not present

## 2020-09-10 DIAGNOSIS — N186 End stage renal disease: Secondary | ICD-10-CM | POA: Diagnosis not present

## 2020-09-10 DIAGNOSIS — N2581 Secondary hyperparathyroidism of renal origin: Secondary | ICD-10-CM | POA: Diagnosis not present

## 2020-09-12 DIAGNOSIS — Z992 Dependence on renal dialysis: Secondary | ICD-10-CM | POA: Diagnosis not present

## 2020-09-12 DIAGNOSIS — N186 End stage renal disease: Secondary | ICD-10-CM | POA: Diagnosis not present

## 2020-09-12 DIAGNOSIS — D688 Other specified coagulation defects: Secondary | ICD-10-CM | POA: Diagnosis not present

## 2020-09-12 DIAGNOSIS — D631 Anemia in chronic kidney disease: Secondary | ICD-10-CM | POA: Diagnosis not present

## 2020-09-12 DIAGNOSIS — E1151 Type 2 diabetes mellitus with diabetic peripheral angiopathy without gangrene: Secondary | ICD-10-CM | POA: Diagnosis not present

## 2020-09-12 DIAGNOSIS — N2581 Secondary hyperparathyroidism of renal origin: Secondary | ICD-10-CM | POA: Diagnosis not present

## 2020-09-12 DIAGNOSIS — D509 Iron deficiency anemia, unspecified: Secondary | ICD-10-CM | POA: Diagnosis not present

## 2020-09-14 DIAGNOSIS — N186 End stage renal disease: Secondary | ICD-10-CM | POA: Diagnosis not present

## 2020-09-14 DIAGNOSIS — I132 Hypertensive heart and chronic kidney disease with heart failure and with stage 5 chronic kidney disease, or end stage renal disease: Secondary | ICD-10-CM | POA: Diagnosis not present

## 2020-09-14 DIAGNOSIS — R011 Cardiac murmur, unspecified: Secondary | ICD-10-CM | POA: Diagnosis not present

## 2020-09-14 DIAGNOSIS — Z79899 Other long term (current) drug therapy: Secondary | ICD-10-CM | POA: Diagnosis not present

## 2020-09-15 DIAGNOSIS — N2581 Secondary hyperparathyroidism of renal origin: Secondary | ICD-10-CM | POA: Diagnosis not present

## 2020-09-15 DIAGNOSIS — D509 Iron deficiency anemia, unspecified: Secondary | ICD-10-CM | POA: Diagnosis not present

## 2020-09-15 DIAGNOSIS — D631 Anemia in chronic kidney disease: Secondary | ICD-10-CM | POA: Diagnosis not present

## 2020-09-15 DIAGNOSIS — E1151 Type 2 diabetes mellitus with diabetic peripheral angiopathy without gangrene: Secondary | ICD-10-CM | POA: Diagnosis not present

## 2020-09-15 DIAGNOSIS — N186 End stage renal disease: Secondary | ICD-10-CM | POA: Diagnosis not present

## 2020-09-15 DIAGNOSIS — Z992 Dependence on renal dialysis: Secondary | ICD-10-CM | POA: Diagnosis not present

## 2020-09-15 DIAGNOSIS — D688 Other specified coagulation defects: Secondary | ICD-10-CM | POA: Diagnosis not present

## 2020-09-17 DIAGNOSIS — E1151 Type 2 diabetes mellitus with diabetic peripheral angiopathy without gangrene: Secondary | ICD-10-CM | POA: Diagnosis not present

## 2020-09-17 DIAGNOSIS — Z992 Dependence on renal dialysis: Secondary | ICD-10-CM | POA: Diagnosis not present

## 2020-09-17 DIAGNOSIS — D688 Other specified coagulation defects: Secondary | ICD-10-CM | POA: Diagnosis not present

## 2020-09-17 DIAGNOSIS — D509 Iron deficiency anemia, unspecified: Secondary | ICD-10-CM | POA: Diagnosis not present

## 2020-09-17 DIAGNOSIS — D631 Anemia in chronic kidney disease: Secondary | ICD-10-CM | POA: Diagnosis not present

## 2020-09-17 DIAGNOSIS — N2581 Secondary hyperparathyroidism of renal origin: Secondary | ICD-10-CM | POA: Diagnosis not present

## 2020-09-17 DIAGNOSIS — N186 End stage renal disease: Secondary | ICD-10-CM | POA: Diagnosis not present

## 2020-09-19 DIAGNOSIS — N2581 Secondary hyperparathyroidism of renal origin: Secondary | ICD-10-CM | POA: Diagnosis not present

## 2020-09-19 DIAGNOSIS — D631 Anemia in chronic kidney disease: Secondary | ICD-10-CM | POA: Diagnosis not present

## 2020-09-19 DIAGNOSIS — E1151 Type 2 diabetes mellitus with diabetic peripheral angiopathy without gangrene: Secondary | ICD-10-CM | POA: Diagnosis not present

## 2020-09-19 DIAGNOSIS — D688 Other specified coagulation defects: Secondary | ICD-10-CM | POA: Diagnosis not present

## 2020-09-19 DIAGNOSIS — Z992 Dependence on renal dialysis: Secondary | ICD-10-CM | POA: Diagnosis not present

## 2020-09-19 DIAGNOSIS — N186 End stage renal disease: Secondary | ICD-10-CM | POA: Diagnosis not present

## 2020-09-19 DIAGNOSIS — D509 Iron deficiency anemia, unspecified: Secondary | ICD-10-CM | POA: Diagnosis not present

## 2020-09-22 DIAGNOSIS — N186 End stage renal disease: Secondary | ICD-10-CM | POA: Diagnosis not present

## 2020-09-22 DIAGNOSIS — D509 Iron deficiency anemia, unspecified: Secondary | ICD-10-CM | POA: Diagnosis not present

## 2020-09-22 DIAGNOSIS — E1151 Type 2 diabetes mellitus with diabetic peripheral angiopathy without gangrene: Secondary | ICD-10-CM | POA: Diagnosis not present

## 2020-09-22 DIAGNOSIS — D688 Other specified coagulation defects: Secondary | ICD-10-CM | POA: Diagnosis not present

## 2020-09-22 DIAGNOSIS — D631 Anemia in chronic kidney disease: Secondary | ICD-10-CM | POA: Diagnosis not present

## 2020-09-22 DIAGNOSIS — Z992 Dependence on renal dialysis: Secondary | ICD-10-CM | POA: Diagnosis not present

## 2020-09-22 DIAGNOSIS — N2581 Secondary hyperparathyroidism of renal origin: Secondary | ICD-10-CM | POA: Diagnosis not present

## 2020-09-24 DIAGNOSIS — E1151 Type 2 diabetes mellitus with diabetic peripheral angiopathy without gangrene: Secondary | ICD-10-CM | POA: Diagnosis not present

## 2020-09-24 DIAGNOSIS — D631 Anemia in chronic kidney disease: Secondary | ICD-10-CM | POA: Diagnosis not present

## 2020-09-24 DIAGNOSIS — N2581 Secondary hyperparathyroidism of renal origin: Secondary | ICD-10-CM | POA: Diagnosis not present

## 2020-09-24 DIAGNOSIS — Z992 Dependence on renal dialysis: Secondary | ICD-10-CM | POA: Diagnosis not present

## 2020-09-24 DIAGNOSIS — N186 End stage renal disease: Secondary | ICD-10-CM | POA: Diagnosis not present

## 2020-09-24 DIAGNOSIS — D509 Iron deficiency anemia, unspecified: Secondary | ICD-10-CM | POA: Diagnosis not present

## 2020-09-24 DIAGNOSIS — D688 Other specified coagulation defects: Secondary | ICD-10-CM | POA: Diagnosis not present

## 2020-09-26 DIAGNOSIS — E1151 Type 2 diabetes mellitus with diabetic peripheral angiopathy without gangrene: Secondary | ICD-10-CM | POA: Diagnosis not present

## 2020-09-26 DIAGNOSIS — Z992 Dependence on renal dialysis: Secondary | ICD-10-CM | POA: Diagnosis not present

## 2020-09-26 DIAGNOSIS — D631 Anemia in chronic kidney disease: Secondary | ICD-10-CM | POA: Diagnosis not present

## 2020-09-26 DIAGNOSIS — D509 Iron deficiency anemia, unspecified: Secondary | ICD-10-CM | POA: Diagnosis not present

## 2020-09-26 DIAGNOSIS — D688 Other specified coagulation defects: Secondary | ICD-10-CM | POA: Diagnosis not present

## 2020-09-26 DIAGNOSIS — N2581 Secondary hyperparathyroidism of renal origin: Secondary | ICD-10-CM | POA: Diagnosis not present

## 2020-09-26 DIAGNOSIS — N186 End stage renal disease: Secondary | ICD-10-CM | POA: Diagnosis not present

## 2020-09-29 DIAGNOSIS — D631 Anemia in chronic kidney disease: Secondary | ICD-10-CM | POA: Diagnosis not present

## 2020-09-29 DIAGNOSIS — D688 Other specified coagulation defects: Secondary | ICD-10-CM | POA: Diagnosis not present

## 2020-09-29 DIAGNOSIS — N2581 Secondary hyperparathyroidism of renal origin: Secondary | ICD-10-CM | POA: Diagnosis not present

## 2020-09-29 DIAGNOSIS — D509 Iron deficiency anemia, unspecified: Secondary | ICD-10-CM | POA: Diagnosis not present

## 2020-09-29 DIAGNOSIS — N186 End stage renal disease: Secondary | ICD-10-CM | POA: Diagnosis not present

## 2020-09-29 DIAGNOSIS — E1151 Type 2 diabetes mellitus with diabetic peripheral angiopathy without gangrene: Secondary | ICD-10-CM | POA: Diagnosis not present

## 2020-09-29 DIAGNOSIS — Z992 Dependence on renal dialysis: Secondary | ICD-10-CM | POA: Diagnosis not present

## 2020-10-01 DIAGNOSIS — D509 Iron deficiency anemia, unspecified: Secondary | ICD-10-CM | POA: Diagnosis not present

## 2020-10-01 DIAGNOSIS — N2581 Secondary hyperparathyroidism of renal origin: Secondary | ICD-10-CM | POA: Diagnosis not present

## 2020-10-01 DIAGNOSIS — D688 Other specified coagulation defects: Secondary | ICD-10-CM | POA: Diagnosis not present

## 2020-10-01 DIAGNOSIS — N186 End stage renal disease: Secondary | ICD-10-CM | POA: Diagnosis not present

## 2020-10-01 DIAGNOSIS — E1151 Type 2 diabetes mellitus with diabetic peripheral angiopathy without gangrene: Secondary | ICD-10-CM | POA: Diagnosis not present

## 2020-10-01 DIAGNOSIS — D631 Anemia in chronic kidney disease: Secondary | ICD-10-CM | POA: Diagnosis not present

## 2020-10-01 DIAGNOSIS — Z992 Dependence on renal dialysis: Secondary | ICD-10-CM | POA: Diagnosis not present

## 2020-10-03 DIAGNOSIS — N2581 Secondary hyperparathyroidism of renal origin: Secondary | ICD-10-CM | POA: Diagnosis not present

## 2020-10-03 DIAGNOSIS — D509 Iron deficiency anemia, unspecified: Secondary | ICD-10-CM | POA: Diagnosis not present

## 2020-10-03 DIAGNOSIS — E1151 Type 2 diabetes mellitus with diabetic peripheral angiopathy without gangrene: Secondary | ICD-10-CM | POA: Diagnosis not present

## 2020-10-03 DIAGNOSIS — Z992 Dependence on renal dialysis: Secondary | ICD-10-CM | POA: Diagnosis not present

## 2020-10-03 DIAGNOSIS — D688 Other specified coagulation defects: Secondary | ICD-10-CM | POA: Diagnosis not present

## 2020-10-03 DIAGNOSIS — D631 Anemia in chronic kidney disease: Secondary | ICD-10-CM | POA: Diagnosis not present

## 2020-10-03 DIAGNOSIS — N186 End stage renal disease: Secondary | ICD-10-CM | POA: Diagnosis not present

## 2020-10-06 DIAGNOSIS — D688 Other specified coagulation defects: Secondary | ICD-10-CM | POA: Diagnosis not present

## 2020-10-06 DIAGNOSIS — Z992 Dependence on renal dialysis: Secondary | ICD-10-CM | POA: Diagnosis not present

## 2020-10-06 DIAGNOSIS — N186 End stage renal disease: Secondary | ICD-10-CM | POA: Diagnosis not present

## 2020-10-06 DIAGNOSIS — D509 Iron deficiency anemia, unspecified: Secondary | ICD-10-CM | POA: Diagnosis not present

## 2020-10-06 DIAGNOSIS — N2581 Secondary hyperparathyroidism of renal origin: Secondary | ICD-10-CM | POA: Diagnosis not present

## 2020-10-06 DIAGNOSIS — E1151 Type 2 diabetes mellitus with diabetic peripheral angiopathy without gangrene: Secondary | ICD-10-CM | POA: Diagnosis not present

## 2020-10-06 DIAGNOSIS — D631 Anemia in chronic kidney disease: Secondary | ICD-10-CM | POA: Diagnosis not present

## 2020-10-08 DIAGNOSIS — D688 Other specified coagulation defects: Secondary | ICD-10-CM | POA: Diagnosis not present

## 2020-10-08 DIAGNOSIS — D631 Anemia in chronic kidney disease: Secondary | ICD-10-CM | POA: Diagnosis not present

## 2020-10-08 DIAGNOSIS — E1151 Type 2 diabetes mellitus with diabetic peripheral angiopathy without gangrene: Secondary | ICD-10-CM | POA: Diagnosis not present

## 2020-10-08 DIAGNOSIS — N186 End stage renal disease: Secondary | ICD-10-CM | POA: Diagnosis not present

## 2020-10-08 DIAGNOSIS — D509 Iron deficiency anemia, unspecified: Secondary | ICD-10-CM | POA: Diagnosis not present

## 2020-10-08 DIAGNOSIS — Z992 Dependence on renal dialysis: Secondary | ICD-10-CM | POA: Diagnosis not present

## 2020-10-08 DIAGNOSIS — N2581 Secondary hyperparathyroidism of renal origin: Secondary | ICD-10-CM | POA: Diagnosis not present

## 2020-10-10 DIAGNOSIS — E1151 Type 2 diabetes mellitus with diabetic peripheral angiopathy without gangrene: Secondary | ICD-10-CM | POA: Diagnosis not present

## 2020-10-10 DIAGNOSIS — D509 Iron deficiency anemia, unspecified: Secondary | ICD-10-CM | POA: Diagnosis not present

## 2020-10-10 DIAGNOSIS — Z992 Dependence on renal dialysis: Secondary | ICD-10-CM | POA: Diagnosis not present

## 2020-10-10 DIAGNOSIS — D631 Anemia in chronic kidney disease: Secondary | ICD-10-CM | POA: Diagnosis not present

## 2020-10-10 DIAGNOSIS — N186 End stage renal disease: Secondary | ICD-10-CM | POA: Diagnosis not present

## 2020-10-10 DIAGNOSIS — D688 Other specified coagulation defects: Secondary | ICD-10-CM | POA: Diagnosis not present

## 2020-10-10 DIAGNOSIS — N2581 Secondary hyperparathyroidism of renal origin: Secondary | ICD-10-CM | POA: Diagnosis not present

## 2020-10-11 DIAGNOSIS — Z992 Dependence on renal dialysis: Secondary | ICD-10-CM | POA: Diagnosis not present

## 2020-10-11 DIAGNOSIS — E1122 Type 2 diabetes mellitus with diabetic chronic kidney disease: Secondary | ICD-10-CM | POA: Diagnosis not present

## 2020-10-11 DIAGNOSIS — N186 End stage renal disease: Secondary | ICD-10-CM | POA: Diagnosis not present

## 2020-10-13 DIAGNOSIS — R52 Pain, unspecified: Secondary | ICD-10-CM | POA: Diagnosis not present

## 2020-10-13 DIAGNOSIS — Z992 Dependence on renal dialysis: Secondary | ICD-10-CM | POA: Diagnosis not present

## 2020-10-13 DIAGNOSIS — N2581 Secondary hyperparathyroidism of renal origin: Secondary | ICD-10-CM | POA: Diagnosis not present

## 2020-10-13 DIAGNOSIS — R509 Fever, unspecified: Secondary | ICD-10-CM | POA: Diagnosis not present

## 2020-10-13 DIAGNOSIS — D509 Iron deficiency anemia, unspecified: Secondary | ICD-10-CM | POA: Diagnosis not present

## 2020-10-13 DIAGNOSIS — N186 End stage renal disease: Secondary | ICD-10-CM | POA: Diagnosis not present

## 2020-10-13 DIAGNOSIS — E1151 Type 2 diabetes mellitus with diabetic peripheral angiopathy without gangrene: Secondary | ICD-10-CM | POA: Diagnosis not present

## 2020-10-13 DIAGNOSIS — D631 Anemia in chronic kidney disease: Secondary | ICD-10-CM | POA: Diagnosis not present

## 2020-10-13 DIAGNOSIS — D688 Other specified coagulation defects: Secondary | ICD-10-CM | POA: Diagnosis not present

## 2020-10-15 DIAGNOSIS — N2581 Secondary hyperparathyroidism of renal origin: Secondary | ICD-10-CM | POA: Diagnosis not present

## 2020-10-15 DIAGNOSIS — R52 Pain, unspecified: Secondary | ICD-10-CM | POA: Diagnosis not present

## 2020-10-15 DIAGNOSIS — E1151 Type 2 diabetes mellitus with diabetic peripheral angiopathy without gangrene: Secondary | ICD-10-CM | POA: Diagnosis not present

## 2020-10-15 DIAGNOSIS — D631 Anemia in chronic kidney disease: Secondary | ICD-10-CM | POA: Diagnosis not present

## 2020-10-15 DIAGNOSIS — D688 Other specified coagulation defects: Secondary | ICD-10-CM | POA: Diagnosis not present

## 2020-10-15 DIAGNOSIS — R509 Fever, unspecified: Secondary | ICD-10-CM | POA: Diagnosis not present

## 2020-10-15 DIAGNOSIS — D509 Iron deficiency anemia, unspecified: Secondary | ICD-10-CM | POA: Diagnosis not present

## 2020-10-15 DIAGNOSIS — N186 End stage renal disease: Secondary | ICD-10-CM | POA: Diagnosis not present

## 2020-10-15 DIAGNOSIS — Z992 Dependence on renal dialysis: Secondary | ICD-10-CM | POA: Diagnosis not present

## 2020-10-17 DIAGNOSIS — N2581 Secondary hyperparathyroidism of renal origin: Secondary | ICD-10-CM | POA: Diagnosis not present

## 2020-10-17 DIAGNOSIS — R52 Pain, unspecified: Secondary | ICD-10-CM | POA: Diagnosis not present

## 2020-10-17 DIAGNOSIS — D631 Anemia in chronic kidney disease: Secondary | ICD-10-CM | POA: Diagnosis not present

## 2020-10-17 DIAGNOSIS — D509 Iron deficiency anemia, unspecified: Secondary | ICD-10-CM | POA: Diagnosis not present

## 2020-10-17 DIAGNOSIS — R509 Fever, unspecified: Secondary | ICD-10-CM | POA: Diagnosis not present

## 2020-10-17 DIAGNOSIS — N186 End stage renal disease: Secondary | ICD-10-CM | POA: Diagnosis not present

## 2020-10-17 DIAGNOSIS — Z992 Dependence on renal dialysis: Secondary | ICD-10-CM | POA: Diagnosis not present

## 2020-10-17 DIAGNOSIS — D688 Other specified coagulation defects: Secondary | ICD-10-CM | POA: Diagnosis not present

## 2020-10-17 DIAGNOSIS — E1151 Type 2 diabetes mellitus with diabetic peripheral angiopathy without gangrene: Secondary | ICD-10-CM | POA: Diagnosis not present

## 2020-10-20 DIAGNOSIS — D688 Other specified coagulation defects: Secondary | ICD-10-CM | POA: Diagnosis not present

## 2020-10-20 DIAGNOSIS — D631 Anemia in chronic kidney disease: Secondary | ICD-10-CM | POA: Diagnosis not present

## 2020-10-20 DIAGNOSIS — N2581 Secondary hyperparathyroidism of renal origin: Secondary | ICD-10-CM | POA: Diagnosis not present

## 2020-10-20 DIAGNOSIS — I509 Heart failure, unspecified: Secondary | ICD-10-CM | POA: Diagnosis not present

## 2020-10-20 DIAGNOSIS — D509 Iron deficiency anemia, unspecified: Secondary | ICD-10-CM | POA: Diagnosis not present

## 2020-10-20 DIAGNOSIS — R600 Localized edema: Secondary | ICD-10-CM | POA: Diagnosis not present

## 2020-10-20 DIAGNOSIS — R011 Cardiac murmur, unspecified: Secondary | ICD-10-CM | POA: Diagnosis not present

## 2020-10-20 DIAGNOSIS — Z992 Dependence on renal dialysis: Secondary | ICD-10-CM | POA: Diagnosis not present

## 2020-10-20 DIAGNOSIS — R52 Pain, unspecified: Secondary | ICD-10-CM | POA: Diagnosis not present

## 2020-10-20 DIAGNOSIS — N186 End stage renal disease: Secondary | ICD-10-CM | POA: Diagnosis not present

## 2020-10-20 DIAGNOSIS — E1151 Type 2 diabetes mellitus with diabetic peripheral angiopathy without gangrene: Secondary | ICD-10-CM | POA: Diagnosis not present

## 2020-10-20 DIAGNOSIS — R509 Fever, unspecified: Secondary | ICD-10-CM | POA: Diagnosis not present

## 2020-10-22 DIAGNOSIS — E1151 Type 2 diabetes mellitus with diabetic peripheral angiopathy without gangrene: Secondary | ICD-10-CM | POA: Diagnosis not present

## 2020-10-22 DIAGNOSIS — Z992 Dependence on renal dialysis: Secondary | ICD-10-CM | POA: Diagnosis not present

## 2020-10-22 DIAGNOSIS — D631 Anemia in chronic kidney disease: Secondary | ICD-10-CM | POA: Diagnosis not present

## 2020-10-22 DIAGNOSIS — R52 Pain, unspecified: Secondary | ICD-10-CM | POA: Diagnosis not present

## 2020-10-22 DIAGNOSIS — N2581 Secondary hyperparathyroidism of renal origin: Secondary | ICD-10-CM | POA: Diagnosis not present

## 2020-10-22 DIAGNOSIS — D688 Other specified coagulation defects: Secondary | ICD-10-CM | POA: Diagnosis not present

## 2020-10-22 DIAGNOSIS — D509 Iron deficiency anemia, unspecified: Secondary | ICD-10-CM | POA: Diagnosis not present

## 2020-10-22 DIAGNOSIS — N186 End stage renal disease: Secondary | ICD-10-CM | POA: Diagnosis not present

## 2020-10-22 DIAGNOSIS — R509 Fever, unspecified: Secondary | ICD-10-CM | POA: Diagnosis not present

## 2020-10-24 DIAGNOSIS — N186 End stage renal disease: Secondary | ICD-10-CM | POA: Diagnosis not present

## 2020-10-24 DIAGNOSIS — D688 Other specified coagulation defects: Secondary | ICD-10-CM | POA: Diagnosis not present

## 2020-10-24 DIAGNOSIS — D631 Anemia in chronic kidney disease: Secondary | ICD-10-CM | POA: Diagnosis not present

## 2020-10-24 DIAGNOSIS — R52 Pain, unspecified: Secondary | ICD-10-CM | POA: Diagnosis not present

## 2020-10-24 DIAGNOSIS — N2581 Secondary hyperparathyroidism of renal origin: Secondary | ICD-10-CM | POA: Diagnosis not present

## 2020-10-24 DIAGNOSIS — Z992 Dependence on renal dialysis: Secondary | ICD-10-CM | POA: Diagnosis not present

## 2020-10-24 DIAGNOSIS — D509 Iron deficiency anemia, unspecified: Secondary | ICD-10-CM | POA: Diagnosis not present

## 2020-10-24 DIAGNOSIS — R509 Fever, unspecified: Secondary | ICD-10-CM | POA: Diagnosis not present

## 2020-10-24 DIAGNOSIS — E1151 Type 2 diabetes mellitus with diabetic peripheral angiopathy without gangrene: Secondary | ICD-10-CM | POA: Diagnosis not present

## 2020-10-27 DIAGNOSIS — D631 Anemia in chronic kidney disease: Secondary | ICD-10-CM | POA: Diagnosis not present

## 2020-10-27 DIAGNOSIS — N2581 Secondary hyperparathyroidism of renal origin: Secondary | ICD-10-CM | POA: Diagnosis not present

## 2020-10-27 DIAGNOSIS — R52 Pain, unspecified: Secondary | ICD-10-CM | POA: Diagnosis not present

## 2020-10-27 DIAGNOSIS — D509 Iron deficiency anemia, unspecified: Secondary | ICD-10-CM | POA: Diagnosis not present

## 2020-10-27 DIAGNOSIS — Z992 Dependence on renal dialysis: Secondary | ICD-10-CM | POA: Diagnosis not present

## 2020-10-27 DIAGNOSIS — R509 Fever, unspecified: Secondary | ICD-10-CM | POA: Diagnosis not present

## 2020-10-27 DIAGNOSIS — E1151 Type 2 diabetes mellitus with diabetic peripheral angiopathy without gangrene: Secondary | ICD-10-CM | POA: Diagnosis not present

## 2020-10-27 DIAGNOSIS — N186 End stage renal disease: Secondary | ICD-10-CM | POA: Diagnosis not present

## 2020-10-27 DIAGNOSIS — D688 Other specified coagulation defects: Secondary | ICD-10-CM | POA: Diagnosis not present

## 2020-10-29 DIAGNOSIS — D688 Other specified coagulation defects: Secondary | ICD-10-CM | POA: Diagnosis not present

## 2020-10-29 DIAGNOSIS — R509 Fever, unspecified: Secondary | ICD-10-CM | POA: Diagnosis not present

## 2020-10-29 DIAGNOSIS — D509 Iron deficiency anemia, unspecified: Secondary | ICD-10-CM | POA: Diagnosis not present

## 2020-10-29 DIAGNOSIS — Z992 Dependence on renal dialysis: Secondary | ICD-10-CM | POA: Diagnosis not present

## 2020-10-29 DIAGNOSIS — D631 Anemia in chronic kidney disease: Secondary | ICD-10-CM | POA: Diagnosis not present

## 2020-10-29 DIAGNOSIS — R52 Pain, unspecified: Secondary | ICD-10-CM | POA: Diagnosis not present

## 2020-10-29 DIAGNOSIS — E1151 Type 2 diabetes mellitus with diabetic peripheral angiopathy without gangrene: Secondary | ICD-10-CM | POA: Diagnosis not present

## 2020-10-29 DIAGNOSIS — N186 End stage renal disease: Secondary | ICD-10-CM | POA: Diagnosis not present

## 2020-10-29 DIAGNOSIS — N2581 Secondary hyperparathyroidism of renal origin: Secondary | ICD-10-CM | POA: Diagnosis not present

## 2020-10-31 DIAGNOSIS — R52 Pain, unspecified: Secondary | ICD-10-CM | POA: Diagnosis not present

## 2020-10-31 DIAGNOSIS — N2581 Secondary hyperparathyroidism of renal origin: Secondary | ICD-10-CM | POA: Diagnosis not present

## 2020-10-31 DIAGNOSIS — D688 Other specified coagulation defects: Secondary | ICD-10-CM | POA: Diagnosis not present

## 2020-10-31 DIAGNOSIS — R509 Fever, unspecified: Secondary | ICD-10-CM | POA: Diagnosis not present

## 2020-10-31 DIAGNOSIS — D631 Anemia in chronic kidney disease: Secondary | ICD-10-CM | POA: Diagnosis not present

## 2020-10-31 DIAGNOSIS — Z992 Dependence on renal dialysis: Secondary | ICD-10-CM | POA: Diagnosis not present

## 2020-10-31 DIAGNOSIS — N186 End stage renal disease: Secondary | ICD-10-CM | POA: Diagnosis not present

## 2020-10-31 DIAGNOSIS — D509 Iron deficiency anemia, unspecified: Secondary | ICD-10-CM | POA: Diagnosis not present

## 2020-10-31 DIAGNOSIS — E1151 Type 2 diabetes mellitus with diabetic peripheral angiopathy without gangrene: Secondary | ICD-10-CM | POA: Diagnosis not present

## 2020-11-02 DIAGNOSIS — D688 Other specified coagulation defects: Secondary | ICD-10-CM | POA: Diagnosis not present

## 2020-11-02 DIAGNOSIS — R509 Fever, unspecified: Secondary | ICD-10-CM | POA: Diagnosis not present

## 2020-11-02 DIAGNOSIS — D631 Anemia in chronic kidney disease: Secondary | ICD-10-CM | POA: Diagnosis not present

## 2020-11-02 DIAGNOSIS — D509 Iron deficiency anemia, unspecified: Secondary | ICD-10-CM | POA: Diagnosis not present

## 2020-11-02 DIAGNOSIS — N186 End stage renal disease: Secondary | ICD-10-CM | POA: Diagnosis not present

## 2020-11-02 DIAGNOSIS — N2581 Secondary hyperparathyroidism of renal origin: Secondary | ICD-10-CM | POA: Diagnosis not present

## 2020-11-02 DIAGNOSIS — Z992 Dependence on renal dialysis: Secondary | ICD-10-CM | POA: Diagnosis not present

## 2020-11-02 DIAGNOSIS — R52 Pain, unspecified: Secondary | ICD-10-CM | POA: Diagnosis not present

## 2020-11-02 DIAGNOSIS — E1151 Type 2 diabetes mellitus with diabetic peripheral angiopathy without gangrene: Secondary | ICD-10-CM | POA: Diagnosis not present

## 2020-11-06 DIAGNOSIS — Z4800 Encounter for change or removal of nonsurgical wound dressing: Secondary | ICD-10-CM | POA: Diagnosis not present

## 2020-11-07 DIAGNOSIS — R509 Fever, unspecified: Secondary | ICD-10-CM | POA: Diagnosis not present

## 2020-11-07 DIAGNOSIS — D631 Anemia in chronic kidney disease: Secondary | ICD-10-CM | POA: Diagnosis not present

## 2020-11-07 DIAGNOSIS — Z992 Dependence on renal dialysis: Secondary | ICD-10-CM | POA: Diagnosis not present

## 2020-11-07 DIAGNOSIS — N2581 Secondary hyperparathyroidism of renal origin: Secondary | ICD-10-CM | POA: Diagnosis not present

## 2020-11-07 DIAGNOSIS — D688 Other specified coagulation defects: Secondary | ICD-10-CM | POA: Diagnosis not present

## 2020-11-07 DIAGNOSIS — N186 End stage renal disease: Secondary | ICD-10-CM | POA: Diagnosis not present

## 2020-11-07 DIAGNOSIS — E1151 Type 2 diabetes mellitus with diabetic peripheral angiopathy without gangrene: Secondary | ICD-10-CM | POA: Diagnosis not present

## 2020-11-07 DIAGNOSIS — R52 Pain, unspecified: Secondary | ICD-10-CM | POA: Diagnosis not present

## 2020-11-07 DIAGNOSIS — D509 Iron deficiency anemia, unspecified: Secondary | ICD-10-CM | POA: Diagnosis not present

## 2020-11-09 ENCOUNTER — Other Ambulatory Visit: Payer: Self-pay

## 2020-11-09 ENCOUNTER — Other Ambulatory Visit: Payer: Self-pay | Admitting: Family

## 2020-11-09 ENCOUNTER — Ambulatory Visit
Admission: RE | Admit: 2020-11-09 | Discharge: 2020-11-09 | Disposition: A | Discharge status: Home / Self Care | Payer: Medicare Other | Source: Ambulatory Visit | Attending: Family | Admitting: Family

## 2020-11-09 DIAGNOSIS — Z9889 Other specified postprocedural states: Secondary | ICD-10-CM | POA: Diagnosis not present

## 2020-11-09 DIAGNOSIS — N186 End stage renal disease: Secondary | ICD-10-CM | POA: Diagnosis not present

## 2020-11-09 DIAGNOSIS — S91302A Unspecified open wound, left foot, initial encounter: Secondary | ICD-10-CM

## 2020-11-09 DIAGNOSIS — M19072 Primary osteoarthritis, left ankle and foot: Secondary | ICD-10-CM | POA: Diagnosis not present

## 2020-11-09 DIAGNOSIS — S91102A Unspecified open wound of left great toe without damage to nail, initial encounter: Secondary | ICD-10-CM | POA: Diagnosis not present

## 2020-11-09 DIAGNOSIS — E11621 Type 2 diabetes mellitus with foot ulcer: Secondary | ICD-10-CM | POA: Diagnosis not present

## 2020-11-09 DIAGNOSIS — I5032 Chronic diastolic (congestive) heart failure: Secondary | ICD-10-CM | POA: Diagnosis not present

## 2020-11-09 DIAGNOSIS — I132 Hypertensive heart and chronic kidney disease with heart failure and with stage 5 chronic kidney disease, or end stage renal disease: Secondary | ICD-10-CM | POA: Diagnosis not present

## 2020-11-09 DIAGNOSIS — L97529 Non-pressure chronic ulcer of other part of left foot with unspecified severity: Secondary | ICD-10-CM | POA: Diagnosis not present

## 2020-11-09 DIAGNOSIS — M7732 Calcaneal spur, left foot: Secondary | ICD-10-CM | POA: Diagnosis not present

## 2020-11-10 DIAGNOSIS — D631 Anemia in chronic kidney disease: Secondary | ICD-10-CM | POA: Diagnosis not present

## 2020-11-10 DIAGNOSIS — Z992 Dependence on renal dialysis: Secondary | ICD-10-CM | POA: Diagnosis not present

## 2020-11-10 DIAGNOSIS — D509 Iron deficiency anemia, unspecified: Secondary | ICD-10-CM | POA: Diagnosis not present

## 2020-11-10 DIAGNOSIS — N186 End stage renal disease: Secondary | ICD-10-CM | POA: Diagnosis not present

## 2020-11-10 DIAGNOSIS — D688 Other specified coagulation defects: Secondary | ICD-10-CM | POA: Diagnosis not present

## 2020-11-10 DIAGNOSIS — E1151 Type 2 diabetes mellitus with diabetic peripheral angiopathy without gangrene: Secondary | ICD-10-CM | POA: Diagnosis not present

## 2020-11-10 DIAGNOSIS — N2581 Secondary hyperparathyroidism of renal origin: Secondary | ICD-10-CM | POA: Diagnosis not present

## 2020-11-10 DIAGNOSIS — R52 Pain, unspecified: Secondary | ICD-10-CM | POA: Diagnosis not present

## 2020-11-10 DIAGNOSIS — R509 Fever, unspecified: Secondary | ICD-10-CM | POA: Diagnosis not present

## 2020-11-10 DIAGNOSIS — E1122 Type 2 diabetes mellitus with diabetic chronic kidney disease: Secondary | ICD-10-CM | POA: Diagnosis not present

## 2020-11-11 ENCOUNTER — Other Ambulatory Visit: Payer: Self-pay

## 2020-11-11 ENCOUNTER — Emergency Department (HOSPITAL_COMMUNITY)
Admission: EM | Admit: 2020-11-11 | Discharge: 2020-11-11 | Disposition: A | Discharge status: Home / Self Care | Payer: Medicare Other | Attending: Emergency Medicine | Admitting: Emergency Medicine

## 2020-11-11 ENCOUNTER — Encounter (HOSPITAL_COMMUNITY): Payer: Self-pay | Admitting: *Deleted

## 2020-11-11 DIAGNOSIS — Z87891 Personal history of nicotine dependence: Secondary | ICD-10-CM | POA: Insufficient documentation

## 2020-11-11 DIAGNOSIS — Z79899 Other long term (current) drug therapy: Secondary | ICD-10-CM | POA: Diagnosis not present

## 2020-11-11 DIAGNOSIS — E11622 Type 2 diabetes mellitus with other skin ulcer: Secondary | ICD-10-CM | POA: Insufficient documentation

## 2020-11-11 DIAGNOSIS — I5032 Chronic diastolic (congestive) heart failure: Secondary | ICD-10-CM | POA: Insufficient documentation

## 2020-11-11 DIAGNOSIS — L089 Local infection of the skin and subcutaneous tissue, unspecified: Secondary | ICD-10-CM | POA: Diagnosis present

## 2020-11-11 DIAGNOSIS — J45909 Unspecified asthma, uncomplicated: Secondary | ICD-10-CM | POA: Diagnosis not present

## 2020-11-11 DIAGNOSIS — N189 Chronic kidney disease, unspecified: Secondary | ICD-10-CM | POA: Insufficient documentation

## 2020-11-11 DIAGNOSIS — L97521 Non-pressure chronic ulcer of other part of left foot limited to breakdown of skin: Secondary | ICD-10-CM | POA: Diagnosis not present

## 2020-11-11 DIAGNOSIS — I130 Hypertensive heart and chronic kidney disease with heart failure and stage 1 through stage 4 chronic kidney disease, or unspecified chronic kidney disease: Secondary | ICD-10-CM | POA: Diagnosis not present

## 2020-11-11 LAB — BASIC METABOLIC PANEL
Anion gap: 12 (ref 5–15)
BUN: 28 mg/dL — ABNORMAL HIGH (ref 8–23)
CO2: 30 mmol/L (ref 22–32)
Calcium: 8.2 mg/dL — ABNORMAL LOW (ref 8.9–10.3)
Chloride: 92 mmol/L — ABNORMAL LOW (ref 98–111)
Creatinine, Ser: 9 mg/dL — ABNORMAL HIGH (ref 0.61–1.24)
GFR, Estimated: 6 mL/min — ABNORMAL LOW (ref >60)
Glucose, Bld: 86 mg/dL (ref 70–99)
Potassium: 3.6 mmol/L (ref 3.5–5.1)
Sodium: 134 mmol/L — ABNORMAL LOW (ref 135–145)

## 2020-11-11 LAB — CBC WITH DIFFERENTIAL/PLATELET
Abs Immature Granulocytes: 0.03 10*3/uL (ref 0.00–0.07)
Basophils Absolute: 0 10*3/uL (ref 0.0–0.1)
Basophils Relative: 0 %
Eosinophils Absolute: 0.2 10*3/uL (ref 0.0–0.5)
Eosinophils Relative: 3 %
HCT: 33.8 % — ABNORMAL LOW (ref 39.0–52.0)
Hemoglobin: 10.9 g/dL — ABNORMAL LOW (ref 13.0–17.0)
Immature Granulocytes: 0 %
Lymphocytes Relative: 20 %
Lymphs Abs: 1.6 10*3/uL (ref 0.7–4.0)
MCH: 29.6 pg (ref 26.0–34.0)
MCHC: 32.2 g/dL (ref 30.0–36.0)
MCV: 91.8 fL (ref 80.0–100.0)
Monocytes Absolute: 0.6 10*3/uL (ref 0.1–1.0)
Monocytes Relative: 7 %
Neutro Abs: 5.7 10*3/uL (ref 1.7–7.7)
Neutrophils Relative %: 70 %
Platelets: 151 10*3/uL (ref 150–400)
RBC: 3.68 MIL/uL — ABNORMAL LOW (ref 4.22–5.81)
RDW: 15.8 % — ABNORMAL HIGH (ref 11.5–15.5)
WBC: 8.1 10*3/uL (ref 4.0–10.5)
nRBC: 0 % (ref 0.0–0.2)

## 2020-11-11 NOTE — ED Provider Notes (Signed)
[facility] DEPT Provider Note   CSN: [id number] Arrival date & time: 11/11/20  1018     History Chief Complaint  Patient presents with  . Wound Check    left foot    [name] is a 69 y.o. male.  69 yo M with a cc of a wound to his great toe.  This is been going on for about a week now.  Saw his family doctor couple days ago and was started on doxycycline and had put a referral into the wound care center and the tried foot center.  Unfortunately he is not been able to get into the wound care center they said the wait was over a month long.  He denies any fevers.  Denies any significant drainage.  He told me he recently had changed his foot wear and thinks that his foot was rubbing on the inside of the shoe.  The history is provided by the patient.  Wound Check This is a new problem. The current episode started 2 days ago. The problem occurs constantly. The problem has not changed since onset.Pertinent negatives include no chest pain, no abdominal pain, no headaches and no shortness of breath. Nothing aggravates the symptoms. Nothing relieves the symptoms. He has tried nothing for the symptoms. The treatment provided no relief.       Past Medical History:  Diagnosis Date  . Arthritis   . Asthma    as a child  . Chronic cystitis   . Chronic diastolic heart failure ([place])   . Chronic kidney disease    HD pt, 3 times a week.  . Diabetes mellitus   . Dysrhythmia   . GERD (gastroesophageal reflux disease)   . H/O hiatal hernia    states it's been fixed  . Hyperlipidemia   . Hypertension   . Lymphedema   . Morbid obesity ([place])   . NEPHROLITHIASIS, HX OF 12/02/2009   Qualifier: Diagnosis of  By: [name], Cat    . PVD (peripheral vascular disease) ([place])   . Renal insufficiency   . UMBILICAL HERNIA 03/20/8118   Qualifier: History of  By: [name] MD, Audelia Acton    . Venous insufficiency     Patient Active Problem List   Diagnosis Date Noted  .  Venous stasis   . History of endocarditis   . Bradycardia   . Orthostasis   . Chest pain 09/10/2018  . Hypovolemia associated with hemodialysis 07/11/2018  . Weakness   . Thrombocytopenia ([place]) 07/10/2018  . Paroxysmal atrial fibrillation ([place]) 05/26/2017  . Balanitis 04/25/2017  . Penile swelling   . Scrotal swelling   . UTI (urinary tract infection) 04/13/2017  . Anemia of chronic disease   . Acute encephalopathy 04/12/2017  . Urinary retention 04/10/2017  . Bacteremia   . RUQ abdominal pain   . Gross hematuria   . Other pancytopenia ([place])   . Sepsis ([place]) 03/21/2017  . Diarrhea 03/03/2017  . Retained lens material following cataract surgery of both eyes 11/08/2016  . Venous stasis ulcers of both lower extremities ([place]) 08/07/2015  . Varicose veins of lower extremities with complications 14/78/2956  . Malfunction of arteriovenous dialysis fistula ([place])   . CHF (congestive heart failure) ([place]) 06/23/2015  . Lymphedema distichiasis syndrome with kidney disease and diabetes mellitus ([place]) 04/06/2015  . Lymphedema of lower extremity 04/04/2015  . Type II diabetes mellitus with nephropathy ([place]) 04/04/2015  . MGUS (monoclonal gammopathy of unknown significance) 06/12/2014  . Chronic diastolic  heart failure ([place]) 10/30/2013  . Special screening for malignant neoplasms, colon 07/09/2013  . Proliferative diabetic retinopathy ([place]) 10/23/2012  . Knee pain 08/21/2012  . Mass of thigh 08/21/2012  . FEVER UNSPECIFIED 05/06/2010  . ORGANIC IMPOTENCE 12/02/2009  . ONYCHOMYCOSIS 07/17/2009  . Lymphedema 05/06/2009  . ESRD on dialysis ([place]) 06/09/2008  . Hyperlipidemia 03/19/2007  . Class 2 severe obesity due to excess calories with serious comorbidity and body mass index (BMI) of 35.0 to 35.9 in adult ([place]) 02/08/2007  . Essential hypertension 02/08/2007  . Atherosclerosis of native arteries of extremity with intermittent claudication ([place]) 02/08/2007    Past Surgical History:    Procedure Laterality Date  . ABDOMINAL AORTOGRAM W/LOWER EXTREMITY N/A 05/19/2017   Procedure: Abdominal Aortogram w/Lower Extremity;  Surgeon: [name], MD;  Location: [facility];  Service: Cardiovascular;  Laterality: N/A;  . AMPUTATION     Right and left fifth toes.   . AMPUTATION TOE     emoval of both little toes  . AV FISTULA PLACEMENT Left 03/21/2014   Procedure: ARTERIOVENOUS (AV) FISTULA CREATION with ultrasound;  Surgeon: [name], MD;  Location: [facility];  Service: Vascular;  Laterality: Left;  . COLONOSCOPY    . EYE SURGERY Bilateral    cataract and lens implant  . HERNIA REPAIR    . LIGATION OF COMPETING BRANCHES OF ARTERIOVENOUS FISTULA Left 06/29/2015   Procedure: LIGATION OF LEFT ARM RADIOCEPHALIC ARTERIOVENOUS FISTULA SIDE BRANCHES;  Surgeon: Conrad Rushville, MD;  Location: [facility];  Service: Vascular;  Laterality: Left;  [name] MULTIPLE EXTRACTIONS WITH ALVEOLOPLASTY N/A 04/07/2017   Procedure: Extraction of tooth #'s 1-11, 13, 14,16, 20-23, and 26-28 with alveoloplasty;  Surgeon: Lenn Cal, DDS;  Location: [facility];  Service: Oral Surgery;  Laterality: N/A;  . Popliteal to posterior tibial bypass     2006  . R knee arthoscopic repair of meniscus    . UMBILICAL HERNIA REPAIR         Family History  Problem Relation Age of Onset  . Diabetes Mother   . Heart disease Mother   . Diabetes Brother     Social History   Tobacco Use  . Smoking status: Former Smoker    Quit date: 06/06/1979    Years since quitting: 41.4  . Smokeless tobacco: Former Network engineer  . Vaping Use: Never used  Substance Use Topics  . Alcohol use: No    Alcohol/week: 0.0 standard drinks    Comment:   "years ago" , none now  . Drug use: No    Home Medications Prior to Admission medications   Medication Sig Start Date End Date Taking? Authorizing Provider  acetaminophen (TYLENOL) 500 MG tablet Take 1,000 mg by mouth daily as needed for moderate pain or headache.     [provider]  amLODipine (NORVASC) 10 MG tablet Take 10 mg by mouth at bedtime.  02/03/17   [provider]  carvedilol (COREG) 6.25 MG tablet Take 6.25 mg by mouth.    [provider]  cinacalcet (SENSIPAR) 30 MG tablet Take 2 tablets (60 mg total) by mouth every Tuesday, Thursday, and Saturday at 6 PM. 09/13/18   Meccariello, Bernita Raisin, DO  furosemide (LASIX) 80 MG tablet Take 1 tablet (80 mg total) by mouth 2 (two) times daily. 06/15/16   McKeag, Marylynn Pearson, MD    Allergies    Shrimp [shellfish allergy]  Review of Systems   Review of Systems  Constitutional: Negative for chills and fever.  HENT: Negative for congestion and facial swelling.   Eyes: Negative for discharge and visual disturbance.  Respiratory: Negative for shortness of breath.   Cardiovascular: Negative for chest pain and palpitations.  Gastrointestinal: Negative for abdominal pain, diarrhea and vomiting.  Musculoskeletal: Negative for arthralgias and myalgias.  Skin: Positive for wound. Negative for color change and rash.  Neurological: Negative for tremors, syncope and headaches.  Psychiatric/Behavioral: Negative for confusion and dysphoric mood.    Physical Exam Updated Vital Signs BP (!) 202/77 (BP Location: Right Arm)   Pulse 73   Temp 98.8 F (37.1 C) (Oral)   Resp 16   Ht 5\' 10"  (1.778 m)   Wt 102.1 kg   SpO2 100%   BMI 32.28 kg/m   Physical Exam Vitals and nursing note reviewed.  Constitutional:      Appearance: He is well-developed.  HENT:     Head: Normocephalic and atraumatic.  Eyes:     Pupils: Pupils are equal, round, and reactive to light.  Neck:     Vascular: No JVD.  Cardiovascular:     Rate and Rhythm: Normal rate and regular rhythm.     Heart sounds: No murmur heard.  No friction rub. No gallop.   Pulmonary:     Effort: No respiratory distress.     Breath sounds: No wheezing.  Abdominal:     General: There is no distension.     Tenderness: There is no  abdominal tenderness. There is no guarding or rebound.  Musculoskeletal:        General: No tenderness. Normal range of motion.     Cervical back: Normal range of motion and neck supple.     Comments: Pallor to the skin along the medial aspects of the left great toe with some skin breakdown.  No obvious purulent drainage.  Sensation intact to light touch but seems diminished.  Foot is warm and well perfused.  Skin:    Coloration: Skin is not pale.     Findings: No rash.  Neurological:     Mental Status: He is alert and oriented to person, place, and time.  Psychiatric:        Behavior: Behavior normal.     ED Results / Procedures / Treatments   Labs (all labs ordered are listed, but only abnormal results are displayed) Labs Reviewed  CBC WITH DIFFERENTIAL/PLATELET - Abnormal; Notable for the following components:      Result Value   RBC 3.68 (*)    Hemoglobin 10.9 (*)    HCT 33.8 (*)    RDW 15.8 (*)    All other components within normal limits  BASIC METABOLIC PANEL - Abnormal; Notable for the following components:   Sodium 134 (*)    Chloride 92 (*)    BUN 28 (*)    Creatinine, Ser 9.00 (*)    Calcium 8.2 (*)    GFR, Estimated 6 (*)    All other components within normal limits    EKG None  Radiology DG Foot 2 Views Left  Result Date: 11/09/2020 CLINICAL DATA:  Open wound on left great toe. EXAM: LEFT FOOT - 2 VIEW COMPARISON:  None. FINDINGS: Two views of the left foot demonstrate postsurgical changes with previous resection of the little toe. Marked abnormality of the residual fifth metatarsal bone likely associated with postsurgical changes and/or post inflammatory changes. Mid joint space narrowing at the first MTP joint. Mild irregularity involving the medial aspect of  the first metatarsal head. Patient has a flatfoot deformity with extensive arthropathy involving the midfoot. Large plantar calcaneal spur. Probable diffuse soft tissue swelling. IMPRESSION: 1. Mild  cortical irregularity involving the medial aspect of the first metatarsal head. Findings are nonspecific. However, if this area corresponds with the wound than this would be suspicious for osteomyelitis. 2. Postsurgical and chronic changes involving the fifth metatarsal bone. 3. Extensive arthropathy in the midfoot. 4. Large calcaneal spur. Electronically Signed   By: Markus Daft M.D.   On: 11/09/2020 13:09    Procedures Procedures (including critical care time)  Medications Ordered in ED Medications - No data to display  ED Course  I have reviewed the triage vital signs and the nursing notes.  Pertinent labs & imaging results that were available during my care of the patient were reviewed by me and considered in my medical decision making (see chart for details).    MDM Rules/Calculators/A&P                          69 yo M with a chief complaint of an ulceration to the left great toe.  This is been going on for about a week now was seen by his family doctor had an x-ray that showed possible osteo and was started on doxycycline.  Initial plans were for him to follow-up [facility] unfortunately the patient is unable to follow-up in the wound care center when he had called they said the wait was greater than a month long.  He then came to the ED for evaluation.  No systemic symptoms.  I reviewed the x-ray from a couple days ago which shows no fracture but possible early signs of osteo-.  I discussed the case with Hilbert Odor, orthopedics he felt that the patient needed to be admitted then he would recommend admission to the [facility] and he would evaluate him upon arrival and likely be seen with due to during the admission.  If he did not need to be admitted then he felt that it would likely be able to be seen in the office in a few days.  The patient is well-appearing and would like to go home.  Will obtain blood work to evaluate for a significant  leukocytosis or recurrence of his diabetes.  No significant leukocytosis.  Patient's blood sugar is normal.  He has chronic kidney disease that appears to be at baseline.  We will have him follow-up with orthopedics in the office.  12:44 PM:  I have discussed the diagnosis/risks/treatment options with the patient and believe the pt to be eligible for discharge home to follow-up with Ortho. We also discussed returning to the ED immediately if new or worsening sx occur. We discussed the sx which are most concerning (e.g., sudden worsening pain, fever, inability to tolerate by mouth) that necessitate immediate return. Medications administered to the patient during their visit and any new prescriptions provided to the patient are listed below.  Medications given during this visit Medications - No data to display   The patient appears reasonably screen and/or stabilized for discharge and I doubt any other medical condition or other [facility] requiring further screening, evaluation, or treatment in the ED at this time prior to discharge.   Final Clinical Impression(s) / ED Diagnoses Final diagnoses:  Skin ulcer of toe of left foot, limited to breakdown of skin ([place])    Rx / DC  Orders ED Discharge Orders    None       Deno Etienne, DO 11/11/20 1244

## 2020-11-11 NOTE — ED Notes (Signed)
Pt states he took his daily hydralazine 30 minutes prior to arrival to ED.

## 2020-11-11 NOTE — ED Triage Notes (Signed)
Pt reports concerns for left foot infection.  Pt had feet wrapped Friday and noticed it.  Pt states that is yellow drainage coming from the big toe. Pt hx of amputation of little toe on both feet. Pt reports he doesn't currently have diabetes but has had it in the past. Pt a/o x 4 and ambulatory.

## 2020-11-11 NOTE — ED Notes (Signed)
Foot cleaned and wrapped.

## 2020-11-11 NOTE — Discharge Instructions (Signed)
Continue your antibiotics.  Call the ortho doc and see when you can see them in the office

## 2020-11-12 DIAGNOSIS — N2581 Secondary hyperparathyroidism of renal origin: Secondary | ICD-10-CM | POA: Diagnosis not present

## 2020-11-12 DIAGNOSIS — L299 Pruritus, unspecified: Secondary | ICD-10-CM | POA: Diagnosis not present

## 2020-11-12 DIAGNOSIS — D509 Iron deficiency anemia, unspecified: Secondary | ICD-10-CM | POA: Diagnosis not present

## 2020-11-12 DIAGNOSIS — E1151 Type 2 diabetes mellitus with diabetic peripheral angiopathy without gangrene: Secondary | ICD-10-CM | POA: Diagnosis not present

## 2020-11-12 DIAGNOSIS — N186 End stage renal disease: Secondary | ICD-10-CM | POA: Diagnosis not present

## 2020-11-12 DIAGNOSIS — D688 Other specified coagulation defects: Secondary | ICD-10-CM | POA: Diagnosis not present

## 2020-11-12 DIAGNOSIS — Z992 Dependence on renal dialysis: Secondary | ICD-10-CM | POA: Diagnosis not present

## 2020-11-14 DIAGNOSIS — D509 Iron deficiency anemia, unspecified: Secondary | ICD-10-CM | POA: Diagnosis not present

## 2020-11-14 DIAGNOSIS — N2581 Secondary hyperparathyroidism of renal origin: Secondary | ICD-10-CM | POA: Diagnosis not present

## 2020-11-14 DIAGNOSIS — L299 Pruritus, unspecified: Secondary | ICD-10-CM | POA: Diagnosis not present

## 2020-11-14 DIAGNOSIS — E1151 Type 2 diabetes mellitus with diabetic peripheral angiopathy without gangrene: Secondary | ICD-10-CM | POA: Diagnosis not present

## 2020-11-14 DIAGNOSIS — N186 End stage renal disease: Secondary | ICD-10-CM | POA: Diagnosis not present

## 2020-11-14 DIAGNOSIS — D688 Other specified coagulation defects: Secondary | ICD-10-CM | POA: Diagnosis not present

## 2020-11-14 DIAGNOSIS — Z992 Dependence on renal dialysis: Secondary | ICD-10-CM | POA: Diagnosis not present

## 2020-11-17 ENCOUNTER — Encounter: Payer: Self-pay | Admitting: Orthopedic Surgery

## 2020-11-17 ENCOUNTER — Ambulatory Visit (INDEPENDENT_AMBULATORY_CARE_PROVIDER_SITE_OTHER): Payer: Medicare Other | Admitting: Orthopedic Surgery

## 2020-11-17 DIAGNOSIS — E11621 Type 2 diabetes mellitus with foot ulcer: Secondary | ICD-10-CM | POA: Diagnosis not present

## 2020-11-17 DIAGNOSIS — L97919 Non-pressure chronic ulcer of unspecified part of right lower leg with unspecified severity: Secondary | ICD-10-CM | POA: Diagnosis not present

## 2020-11-17 DIAGNOSIS — L97929 Non-pressure chronic ulcer of unspecified part of left lower leg with unspecified severity: Secondary | ICD-10-CM | POA: Diagnosis not present

## 2020-11-17 DIAGNOSIS — N2581 Secondary hyperparathyroidism of renal origin: Secondary | ICD-10-CM | POA: Diagnosis not present

## 2020-11-17 DIAGNOSIS — N186 End stage renal disease: Secondary | ICD-10-CM | POA: Diagnosis not present

## 2020-11-17 DIAGNOSIS — D688 Other specified coagulation defects: Secondary | ICD-10-CM | POA: Diagnosis not present

## 2020-11-17 DIAGNOSIS — Z992 Dependence on renal dialysis: Secondary | ICD-10-CM | POA: Diagnosis not present

## 2020-11-17 DIAGNOSIS — I87333 Chronic venous hypertension (idiopathic) with ulcer and inflammation of bilateral lower extremity: Secondary | ICD-10-CM

## 2020-11-17 DIAGNOSIS — D509 Iron deficiency anemia, unspecified: Secondary | ICD-10-CM | POA: Diagnosis not present

## 2020-11-17 DIAGNOSIS — E1151 Type 2 diabetes mellitus with diabetic peripheral angiopathy without gangrene: Secondary | ICD-10-CM | POA: Diagnosis not present

## 2020-11-17 DIAGNOSIS — L299 Pruritus, unspecified: Secondary | ICD-10-CM | POA: Diagnosis not present

## 2020-11-17 NOTE — Progress Notes (Signed)
Office Visit Note   Patient: [name]           Date of Birth: 09-17-1951           MRN: [id number] Visit Date: 11/17/2020              Requested by: [name], [name] [place],  [place] [address] PCP: [name], [name]  Chief Complaint  Patient presents with  . Left Great Toe - Wound Check    +odor, drainage on dressing, states was wearing shoe that wanted to turn on the foot, 1 week prior to noticing the wound which he noticed 2 weeks ago.      HPI: Patient is a 69 year old gentleman who was seen for evaluation for ulceration left great toe.  Patient states it has been there for several weeks.  He states that he was wearing a tight fitting shoe and it rubbed on his toe.  Complains of chronic swelling in both legs for which he wraps his legs from the ankle to the tibial tubercle with Coban and Kerlix.  Patient states that he was diabetic but has not taken medication for 5 years.  Patient has a history of peripheral vascular disease status post revascularization with vascular vein surgery his last arteriogram with them showed patent vessels.  Patient states that he sees Dr. Dustin Folks for his diabetic management and states he goes to dialysis 3 times a week Tuesday Thursday Saturday.  Assessment & Plan: Visit Diagnoses: No diagnosis found.  Plan: Will apply Dynaflex wrap both lower extremities and an Iodosorb dressing to the left great toe with postoperative shoes bilaterally.  We will follow-up this Friday to change the dressings with Iodosorb to the left great toe and Dynaflex wraps for both legs and then follow-up next Tuesday.   Follow-Up Instructions: No follow-ups on file.   Ortho Exam  Patient is alert, oriented, no adenopathy, well-dressed, normal affect, normal respiratory effort. Examination patient has massive venous stasis swelling of both lower extremities.  There is blistering around the left ankle.  There is skin color changes in both  legs.  I cannot palpate a dorsalis pedis pulse the Doppler was used and patient has a strong biphasic dorsalis pedis and posterior tibial pulse on the left.  There is a ulcer 5 cm in diameter over the medial border of the left great toe the eschar was removed and there is healthy granulation tissue at the base.  There is no exposed bone or tendon no sausage digit swelling no abscess.  Patient's last vascular study was in 2018 for the lower extremities.  Patient's last hemoglobin A1c was in 2019.     Imaging: No results found. No images are attached to the encounter.  Labs: Lab Results  Component Value Date   HGBA1C 6.2 (H) 07/11/2018   HGBA1C 6.8 03/13/2017   HGBA1C 5.7 12/30/2015   ESRSEDRATE 70 (H) 04/04/2015   ESRSEDRATE 81 (H) 07/14/2011   ESRSEDRATE 117 (H) 06/28/2011   CRP 6.1 (H) 04/04/2015   CRP 0.9 (H) 07/14/2011   CRP 4.6 (H) 06/28/2011   LABURIC 9.7 (H) 02/23/2015   REPTSTATUS 09/15/2018 FINAL 09/10/2018   GRAMSTAIN  06/24/2011    RARE WBC PRESENT,BOTH PMN AND MONONUCLEAR NO ORGANISMS SEEN   GRAMSTAIN  06/24/2011    RARE WBC PRESENT,BOTH PMN AND MONONUCLEAR NO ORGANISMS SEEN   CULT  09/10/2018    NO GROWTH 5 DAYS Performed at [facility],  [address] [place], [place] [address]    LABORGA [facility] MORGANII 08/21/2015     Lab Results  Component Value Date   ALBUMIN 2.7 (L) 09/12/2018   ALBUMIN 3.1 (L) 09/11/2018   ALBUMIN 3.1 (L) 07/10/2018   LABURIC 9.7 (H) 02/23/2015    Lab Results  Component Value Date   MG 2.0 04/13/2017   MG 1.9 03/22/2017   MG 1.7 03/04/2014   No results found for: VD25OH  No results found for: PREALBUMIN CBC EXTENDED Latest Ref Rng & Units 11/11/2020 09/12/2018 09/11/2018  WBC 4.0 - 10.5 K/uL 8.1 7.2 8.1  RBC 4.22 - 5.81 MIL/uL 3.68(L) 3.25(L) 3.46(L)  HGB 13.0 - 17.0 g/dL 10.9(L) 9.9(L) 10.5(L)  HCT 39 - 52 % 33.8(L) 30.0(L) 32.8(L)  PLT 150 - 400 K/uL 151 92(L) 82(L)  NEUTROABS 1.7 - 7.7 K/uL 5.7 - -   LYMPHSABS 0.7 - 4.0 K/uL 1.6 - -     There is no height or weight on file to calculate BMI.  Orders:  No orders of the defined types were placed in this encounter.  No orders of the defined types were placed in this encounter.    Procedures: No procedures performed  Clinical Data: No additional findings.  ROS:  All other systems negative, except as noted in the HPI. Review of Systems  Objective: Vital Signs: There were no vitals taken for this visit.  Specialty Comments:  No specialty comments available.  PMFS History: Patient Active Problem List   Diagnosis Date Noted  . Venous stasis   . History of endocarditis   . Bradycardia   . Orthostasis   . Chest pain 09/10/2018  . Hypovolemia associated with hemodialysis 07/11/2018  . Weakness   . Thrombocytopenia ([place]) 07/10/2018  . Paroxysmal atrial fibrillation ([place]) 05/26/2017  . Balanitis 04/25/2017  . Penile swelling   . Scrotal swelling   . UTI (urinary tract infection) 04/13/2017  . Anemia of chronic disease   . Acute encephalopathy 04/12/2017  . Urinary retention 04/10/2017  . Bacteremia   . RUQ abdominal pain   . Gross hematuria   . Other pancytopenia ([place])   . Sepsis ([place]) 03/21/2017  . Diarrhea 03/03/2017  . Retained lens material following cataract surgery of both eyes 11/08/2016  . Venous stasis ulcers of both lower extremities ([place]) 08/07/2015  . Varicose veins of lower extremities with complications 00/92/3300  . Malfunction of arteriovenous dialysis fistula ([place])   . CHF (congestive heart failure) ([place]) 06/23/2015  . Lymphedema distichiasis syndrome with kidney disease and diabetes mellitus ([place]) 04/06/2015  . Lymphedema of lower extremity 04/04/2015  . Type II diabetes mellitus with nephropathy ([place]) 04/04/2015  . MGUS (monoclonal gammopathy of unknown significance) 06/12/2014  . Chronic diastolic heart failure ([place]) 10/30/2013  . Special screening for malignant neoplasms, colon 07/09/2013   . Proliferative diabetic retinopathy ([place]) 10/23/2012  . Knee pain 08/21/2012  . Mass of thigh 08/21/2012  . FEVER UNSPECIFIED 05/06/2010  . ORGANIC IMPOTENCE 12/02/2009  . ONYCHOMYCOSIS 07/17/2009  . Lymphedema 05/06/2009  . ESRD on dialysis ([place]) 06/09/2008  . Hyperlipidemia 03/19/2007  . Class 2 severe obesity due to excess calories with serious comorbidity and body mass index (BMI) of 35.0 to 35.9 in adult ([place]) 02/08/2007  . Essential hypertension 02/08/2007  . Atherosclerosis of native arteries of extremity with intermittent claudication ([place]) 02/08/2007   Past Medical History:  Diagnosis Date  . Arthritis   . Asthma    as a child  . Chronic cystitis   .  Chronic diastolic heart failure ([place])   . Chronic kidney disease    HD pt, 3 times a week.  . Diabetes mellitus   . Dysrhythmia   . GERD (gastroesophageal reflux disease)   . H/O hiatal hernia    states it's been fixed  . Hyperlipidemia   . Hypertension   . Lymphedema   . Morbid obesity ([place])   . NEPHROLITHIASIS, HX OF 12/02/2009   Qualifier: Diagnosis of  By: [name], Cat    . PVD (peripheral vascular disease) ([place])   . Renal insufficiency   . UMBILICAL HERNIA 3/35/4562   Qualifier: History of  By: [name] MD, Audelia Acton    . Venous insufficiency     Family History  Problem Relation Age of Onset  . Diabetes Mother   . Heart disease Mother   . Diabetes Brother     Past Surgical History:  Procedure Laterality Date  . ABDOMINAL AORTOGRAM W/LOWER EXTREMITY N/A 05/19/2017   Procedure: Abdominal Aortogram w/Lower Extremity;  Surgeon: [name], MD;  Location: [facility];  Service: Cardiovascular;  Laterality: N/A;  . AMPUTATION     Right and left fifth toes.   . AMPUTATION TOE     emoval of both little toes  . AV FISTULA PLACEMENT Left 03/21/2014   Procedure: ARTERIOVENOUS (AV) FISTULA CREATION with ultrasound;  Surgeon: [name], MD;  Location: [facility];  Service: Vascular;  Laterality: Left;  .  COLONOSCOPY    . EYE SURGERY Bilateral    cataract and lens implant  . HERNIA REPAIR    . LIGATION OF COMPETING BRANCHES OF ARTERIOVENOUS FISTULA Left 06/29/2015   Procedure: LIGATION OF LEFT ARM RADIOCEPHALIC ARTERIOVENOUS FISTULA SIDE BRANCHES;  Surgeon: Conrad Valencia, MD;  Location: [facility];  Service: Vascular;  Laterality: Left;  [name] MULTIPLE EXTRACTIONS WITH ALVEOLOPLASTY N/A 04/07/2017   Procedure: Extraction of tooth #'s 1-11, 13, 14,16, 20-23, and 26-28 with alveoloplasty;  Surgeon: Lenn Cal, DDS;  Location: [facility];  Service: Oral Surgery;  Laterality: N/A;  . Popliteal to posterior tibial bypass     2006  . R knee arthoscopic repair of meniscus    . UMBILICAL HERNIA REPAIR     Social History   Occupational History    Employer: DISABLED   Tobacco Use  . Smoking status: Former Smoker    Quit date: 06/06/1979    Years since quitting: 41.4  . Smokeless tobacco: Former Network engineer  . Vaping Use: Never used  Substance and Sexual Activity  . Alcohol use: No    Alcohol/week: 0.0 standard drinks    Comment:   "years ago" , none now  . Drug use: No  . Sexual activity: Never

## 2020-11-19 DIAGNOSIS — Z992 Dependence on renal dialysis: Secondary | ICD-10-CM | POA: Diagnosis not present

## 2020-11-19 DIAGNOSIS — N186 End stage renal disease: Secondary | ICD-10-CM | POA: Diagnosis not present

## 2020-11-19 DIAGNOSIS — D509 Iron deficiency anemia, unspecified: Secondary | ICD-10-CM | POA: Diagnosis not present

## 2020-11-19 DIAGNOSIS — N2581 Secondary hyperparathyroidism of renal origin: Secondary | ICD-10-CM | POA: Diagnosis not present

## 2020-11-19 DIAGNOSIS — D688 Other specified coagulation defects: Secondary | ICD-10-CM | POA: Diagnosis not present

## 2020-11-19 DIAGNOSIS — E1151 Type 2 diabetes mellitus with diabetic peripheral angiopathy without gangrene: Secondary | ICD-10-CM | POA: Diagnosis not present

## 2020-11-19 DIAGNOSIS — L299 Pruritus, unspecified: Secondary | ICD-10-CM | POA: Diagnosis not present

## 2020-11-20 ENCOUNTER — Encounter: Payer: Self-pay | Admitting: Physician Assistant

## 2020-11-20 ENCOUNTER — Other Ambulatory Visit: Payer: Self-pay | Admitting: Physician Assistant

## 2020-11-20 ENCOUNTER — Ambulatory Visit: Payer: Medicare Other | Admitting: Physician Assistant

## 2020-11-20 DIAGNOSIS — L97919 Non-pressure chronic ulcer of unspecified part of right lower leg with unspecified severity: Secondary | ICD-10-CM | POA: Diagnosis not present

## 2020-11-20 DIAGNOSIS — L97929 Non-pressure chronic ulcer of unspecified part of left lower leg with unspecified severity: Secondary | ICD-10-CM | POA: Diagnosis not present

## 2020-11-20 DIAGNOSIS — I87333 Chronic venous hypertension (idiopathic) with ulcer and inflammation of bilateral lower extremity: Secondary | ICD-10-CM

## 2020-11-20 NOTE — Progress Notes (Signed)
Office Visit Note   Patient: [name]           Date of Birth: December 04, 1951           MRN: [id number] Visit Date: 11/20/2020              Requested by: [name], [name] [place],  [place] [address] PCP: [name], [name]  Chief Complaint  Patient presents with  . Right Leg - Follow-up  . Left Leg - Follow-up      HPI: Patient is a pleasant 69 year old gentleman who is here to follow-up on his bilateral lower extremity compression wrappings and great toe wound.  He has been tolerant of the wraps and says he only gets some itching  Assessment & Plan: Visit Diagnoses: No diagnosis found.  Plan: Patient shown significant improvement with regards to the swelling in his legs.  We will rewrap him 1 more time follow-up with Dr. Sharol Given on Tuesday  Follow-Up Instructions: No follow-ups on file.   Ortho Exam  Patient is alert, oriented, no adenopathy, well-dressed, normal affect, normal respiratory effort. Bilateral lower extremities he has excellent wrinkling of the skin.  No open skin ulcers left great toe has some maceration over the skin no surrounding cellulitis or foul odor.  No evidence of any acute infection  Imaging: No results found. No images are attached to the encounter.  Labs: Lab Results  Component Value Date   HGBA1C 6.2 (H) 07/11/2018   HGBA1C 6.8 03/13/2017   HGBA1C 5.7 12/30/2015   ESRSEDRATE 70 (H) 04/04/2015   ESRSEDRATE 81 (H) 07/14/2011   ESRSEDRATE 117 (H) 06/28/2011   CRP 6.1 (H) 04/04/2015   CRP 0.9 (H) 07/14/2011   CRP 4.6 (H) 06/28/2011   LABURIC 9.7 (H) 02/23/2015   REPTSTATUS 09/15/2018 FINAL 09/10/2018   GRAMSTAIN  06/24/2011    RARE WBC PRESENT,BOTH PMN AND MONONUCLEAR NO ORGANISMS SEEN   GRAMSTAIN  06/24/2011    RARE WBC PRESENT,BOTH PMN AND MONONUCLEAR NO ORGANISMS SEEN   CULT  09/10/2018    NO GROWTH 5 DAYS Performed at [facility], [place] [address] [place], [place] [address]    LABORGA [facility]  MORGANII 08/21/2015     Lab Results  Component Value Date   ALBUMIN 2.7 (L) 09/12/2018   ALBUMIN 3.1 (L) 09/11/2018   ALBUMIN 3.1 (L) 07/10/2018   LABURIC 9.7 (H) 02/23/2015    Lab Results  Component Value Date   MG 2.0 04/13/2017   MG 1.9 03/22/2017   MG 1.7 03/04/2014   No results found for: VD25OH  No results found for: PREALBUMIN CBC EXTENDED Latest Ref Rng & Units 11/11/2020 09/12/2018 09/11/2018  WBC 4.0 - 10.5 K/uL 8.1 7.2 8.1  RBC 4.22 - 5.81 MIL/uL 3.68(L) 3.25(L) 3.46(L)  HGB 13.0 - 17.0 g/dL 10.9(L) 9.9(L) 10.5(L)  HCT 39.0 - 52.0 % 33.8(L) 30.0(L) 32.8(L)  PLT 150 - 400 K/uL 151 92(L) 82(L)  NEUTROABS 1.7 - 7.7 K/uL 5.7 - -  LYMPHSABS 0.7 - 4.0 K/uL 1.6 - -     There is no height or weight on file to calculate BMI.  Orders:  No orders of the defined types were placed in this encounter.  No orders of the defined types were placed in this encounter.    Procedures: No procedures performed  Clinical Data: No additional findings.  ROS:  All other systems negative, except as noted in the HPI. Review of Systems  Objective: Vital  Signs: There were no vitals taken for this visit.  Specialty Comments:  No specialty comments available.  PMFS History: Patient Active Problem List   Diagnosis Date Noted  . Venous stasis   . History of endocarditis   . Bradycardia   . Orthostasis   . Chest pain 09/10/2018  . Hypovolemia associated with hemodialysis 07/11/2018  . Weakness   . Thrombocytopenia ([place]) 07/10/2018  . Paroxysmal atrial fibrillation ([place]) 05/26/2017  . Balanitis 04/25/2017  . Penile swelling   . Scrotal swelling   . UTI (urinary tract infection) 04/13/2017  . Anemia of chronic disease   . Acute encephalopathy 04/12/2017  . Urinary retention 04/10/2017  . Bacteremia   . RUQ abdominal pain   . Gross hematuria   . Other pancytopenia ([place])   . Sepsis ([place]) 03/21/2017  . Diarrhea 03/03/2017  . Retained lens material following cataract  surgery of both eyes 11/08/2016  . Venous stasis ulcers of both lower extremities ([place]) 08/07/2015  . Varicose veins of lower extremities with complications 58/52/7782  . Malfunction of arteriovenous dialysis fistula ([place])   . CHF (congestive heart failure) ([place]) 06/23/2015  . Lymphedema distichiasis syndrome with kidney disease and diabetes mellitus ([place]) 04/06/2015  . Lymphedema of lower extremity 04/04/2015  . Type II diabetes mellitus with nephropathy ([place]) 04/04/2015  . MGUS (monoclonal gammopathy of unknown significance) 06/12/2014  . Chronic diastolic heart failure ([place]) 10/30/2013  . Special screening for malignant neoplasms, colon 07/09/2013  . Proliferative diabetic retinopathy ([place]) 10/23/2012  . Knee pain 08/21/2012  . Mass of thigh 08/21/2012  . FEVER UNSPECIFIED 05/06/2010  . ORGANIC IMPOTENCE 12/02/2009  . ONYCHOMYCOSIS 07/17/2009  . Lymphedema 05/06/2009  . ESRD on dialysis ([place]) 06/09/2008  . Hyperlipidemia 03/19/2007  . Class 2 severe obesity due to excess calories with serious comorbidity and body mass index (BMI) of 35.0 to 35.9 in adult ([place]) 02/08/2007  . Essential hypertension 02/08/2007  . Atherosclerosis of native arteries of extremity with intermittent claudication ([place]) 02/08/2007   Past Medical History:  Diagnosis Date  . Arthritis   . Asthma    as a child  . Chronic cystitis   . Chronic diastolic heart failure ([place])   . Chronic kidney disease    HD pt, 3 times a week.  . Diabetes mellitus   . Dysrhythmia   . GERD (gastroesophageal reflux disease)   . H/O hiatal hernia    states it's been fixed  . Hyperlipidemia   . Hypertension   . Lymphedema   . Morbid obesity ([place])   . NEPHROLITHIASIS, HX OF 12/02/2009   Qualifier: Diagnosis of  By: [name], Cat    . PVD (peripheral vascular disease) ([place])   . Renal insufficiency   . UMBILICAL HERNIA 04/03/5360   Qualifier: History of  By: [name] MD, Audelia Acton    . Venous insufficiency     Family History   Problem Relation Age of Onset  . Diabetes Mother   . Heart disease Mother   . Diabetes Brother     Past Surgical History:  Procedure Laterality Date  . ABDOMINAL AORTOGRAM W/LOWER EXTREMITY N/A 05/19/2017   Procedure: Abdominal Aortogram w/Lower Extremity;  Surgeon: [name], MD;  Location: [facility];  Service: Cardiovascular;  Laterality: N/A;  . AMPUTATION     Right and left fifth toes.   . AMPUTATION TOE     emoval of both little toes  . AV FISTULA PLACEMENT Left 03/21/2014   Procedure: ARTERIOVENOUS (AV) FISTULA CREATION  with ultrasound;  Surgeon: [name], MD;  Location: [facility];  Service: Vascular;  Laterality: Left;  . COLONOSCOPY    . EYE SURGERY Bilateral    cataract and lens implant  . HERNIA REPAIR    . LIGATION OF COMPETING BRANCHES OF ARTERIOVENOUS FISTULA Left 06/29/2015   Procedure: LIGATION OF LEFT ARM RADIOCEPHALIC ARTERIOVENOUS FISTULA SIDE BRANCHES;  Surgeon: Conrad Stella, MD;  Location: [facility];  Service: Vascular;  Laterality: Left;  [name] MULTIPLE EXTRACTIONS WITH ALVEOLOPLASTY N/A 04/07/2017   Procedure: Extraction of tooth #'s 1-11, 13, 14,16, 20-23, and 26-28 with alveoloplasty;  Surgeon: Lenn Cal, DDS;  Location: [facility];  Service: Oral Surgery;  Laterality: N/A;  . Popliteal to posterior tibial bypass     2006  . R knee arthoscopic repair of meniscus    . UMBILICAL HERNIA REPAIR     Social History   Occupational History    Employer: DISABLED   Tobacco Use  . Smoking status: Former Smoker    Quit date: 06/06/1979    Years since quitting: 41.4  . Smokeless tobacco: Former Network engineer  . Vaping Use: Never used  Substance and Sexual Activity  . Alcohol use: No    Alcohol/week: 0.0 standard drinks    Comment:   "years ago" , none now  . Drug use: No  . Sexual activity: Never

## 2020-11-20 NOTE — Progress Notes (Unsigned)
colch[place]

## 2020-11-21 DIAGNOSIS — N186 End stage renal disease: Secondary | ICD-10-CM | POA: Diagnosis not present

## 2020-11-21 DIAGNOSIS — L299 Pruritus, unspecified: Secondary | ICD-10-CM | POA: Diagnosis not present

## 2020-11-21 DIAGNOSIS — Z992 Dependence on renal dialysis: Secondary | ICD-10-CM | POA: Diagnosis not present

## 2020-11-21 DIAGNOSIS — D688 Other specified coagulation defects: Secondary | ICD-10-CM | POA: Diagnosis not present

## 2020-11-21 DIAGNOSIS — E1151 Type 2 diabetes mellitus with diabetic peripheral angiopathy without gangrene: Secondary | ICD-10-CM | POA: Diagnosis not present

## 2020-11-21 DIAGNOSIS — D509 Iron deficiency anemia, unspecified: Secondary | ICD-10-CM | POA: Diagnosis not present

## 2020-11-21 DIAGNOSIS — N2581 Secondary hyperparathyroidism of renal origin: Secondary | ICD-10-CM | POA: Diagnosis not present

## 2020-11-23 ENCOUNTER — Ambulatory Visit: Payer: Medicare Other | Admitting: Orthopedic Surgery

## 2020-11-24 ENCOUNTER — Ambulatory Visit (INDEPENDENT_AMBULATORY_CARE_PROVIDER_SITE_OTHER): Payer: Medicare Other | Admitting: Orthopedic Surgery

## 2020-11-24 DIAGNOSIS — I87333 Chronic venous hypertension (idiopathic) with ulcer and inflammation of bilateral lower extremity: Secondary | ICD-10-CM | POA: Diagnosis not present

## 2020-11-24 DIAGNOSIS — N2581 Secondary hyperparathyroidism of renal origin: Secondary | ICD-10-CM | POA: Diagnosis not present

## 2020-11-24 DIAGNOSIS — L97529 Non-pressure chronic ulcer of other part of left foot with unspecified severity: Secondary | ICD-10-CM

## 2020-11-24 DIAGNOSIS — E1151 Type 2 diabetes mellitus with diabetic peripheral angiopathy without gangrene: Secondary | ICD-10-CM | POA: Diagnosis not present

## 2020-11-24 DIAGNOSIS — Z992 Dependence on renal dialysis: Secondary | ICD-10-CM | POA: Diagnosis not present

## 2020-11-24 DIAGNOSIS — L97929 Non-pressure chronic ulcer of unspecified part of left lower leg with unspecified severity: Secondary | ICD-10-CM | POA: Diagnosis not present

## 2020-11-24 DIAGNOSIS — D688 Other specified coagulation defects: Secondary | ICD-10-CM | POA: Diagnosis not present

## 2020-11-24 DIAGNOSIS — L97919 Non-pressure chronic ulcer of unspecified part of right lower leg with unspecified severity: Secondary | ICD-10-CM

## 2020-11-24 DIAGNOSIS — D509 Iron deficiency anemia, unspecified: Secondary | ICD-10-CM | POA: Diagnosis not present

## 2020-11-24 DIAGNOSIS — L299 Pruritus, unspecified: Secondary | ICD-10-CM | POA: Diagnosis not present

## 2020-11-24 DIAGNOSIS — N186 End stage renal disease: Secondary | ICD-10-CM | POA: Diagnosis not present

## 2020-11-26 ENCOUNTER — Encounter: Payer: Self-pay | Admitting: Orthopedic Surgery

## 2020-11-26 DIAGNOSIS — D688 Other specified coagulation defects: Secondary | ICD-10-CM | POA: Diagnosis not present

## 2020-11-26 DIAGNOSIS — D509 Iron deficiency anemia, unspecified: Secondary | ICD-10-CM | POA: Diagnosis not present

## 2020-11-26 DIAGNOSIS — N2581 Secondary hyperparathyroidism of renal origin: Secondary | ICD-10-CM | POA: Diagnosis not present

## 2020-11-26 DIAGNOSIS — Z992 Dependence on renal dialysis: Secondary | ICD-10-CM | POA: Diagnosis not present

## 2020-11-26 DIAGNOSIS — L299 Pruritus, unspecified: Secondary | ICD-10-CM | POA: Diagnosis not present

## 2020-11-26 DIAGNOSIS — E1151 Type 2 diabetes mellitus with diabetic peripheral angiopathy without gangrene: Secondary | ICD-10-CM | POA: Diagnosis not present

## 2020-11-26 DIAGNOSIS — N186 End stage renal disease: Secondary | ICD-10-CM | POA: Diagnosis not present

## 2020-11-26 NOTE — Progress Notes (Signed)
Office Visit Note   Patient: [name]           Date of Birth: [birth year]           MRN: [id number] Visit Date: 11/24/2020              Requested by: [name], [name] [place],  [place] [address] PCP: [name], [name]  Chief Complaint  Patient presents with   Left Leg - Follow-up   Right Leg - Follow-up      HPI: Patient is a 69 year old gentleman with venous insufficiency both lower extremities.  Patient has a blister on the right great toe from overtightening his shoe wear.  Assessment & Plan: Visit Diagnoses:  1. Chronic venous hypertension (idiopathic) with ulcer and inflammation of bilateral lower extremity (HCC)   2. Ulcer of left great toe due to diabetes mellitus ([place])     Plan: The skin continues to improve the ulcer on the left leg continues to improve will continue with 1 additional week of compression wraps and then advance to compression stockings.  Follow-Up Instructions: No follow-ups on file.   Ortho Exam  Patient is alert, oriented, no adenopathy, well-dressed, normal affect, normal respiratory effort. Examination there is a blister on the right great toe secondary to over pressure from the shoe no signs of infection.  There is good wrinkling of the skin and good granulation tissue in the ulcer on the lateral aspect the left leg.  Imaging: No results found. No images are attached to the encounter.  Labs: Lab Results  Component Value Date   HGBA1C 6.2 (H) 07/11/2018   HGBA1C 6.8 03/13/2017   HGBA1C 5.7 12/30/2015   ESRSEDRATE 70 (H) 04/04/2015   ESRSEDRATE 81 (H) 07/14/2011   ESRSEDRATE 117 (H) 06/28/2011   CRP 6.1 (H) 04/04/2015   CRP 0.9 (H) 07/14/2011   CRP 4.6 (H) 06/28/2011   LABURIC 9.7 (H) 02/23/2015   REPTSTATUS 09/15/2018 FINAL 09/10/2018   GRAMSTAIN  06/24/2011    RARE WBC PRESENT,BOTH PMN AND MONONUCLEAR NO ORGANISMS SEEN   GRAMSTAIN  06/24/2011    RARE WBC PRESENT,BOTH PMN AND MONONUCLEAR NO  ORGANISMS SEEN   CULT  09/10/2018    NO GROWTH 5 DAYS Performed at [facility], [place] [address], [place], [place] [address]    [facility] M[facility]GAN[facility] 08/21/2015     Lab Results  Component Value Date   ALBUMIN 2.7 (L) 09/12/2018   ALBUMIN 3.1 (L) 09/11/2018   ALBUMIN 3.1 (L) 07/10/2018   LABURIC 9.7 (H) 02/23/2015    Lab Results  Component Value Date   MG 2.0 04/13/2017   MG 1.9 03/22/2017   MG 1.7 03/04/2014   No results found for: VD25OH  No results found for: PREALBUMIN CBC EXTENDED Latest Ref Rng & Units 11/11/2020 09/12/2018 09/11/2018  WBC 4.0 - 10.5 K/uL 8.1 7.2 8.1  RBC 4.22 - 5.81 MIL/uL 3.68(L) 3.25(L) 3.46(L)  HGB 13.0 - 17.0 g/dL 10.9(L) 9.9(L) 10.5(L)  HCT 39.0 - 52.0 % 33.8(L) 30.0(L) 32.8(L)  PLT 150 - 400 K/uL 151 92(L) 82(L)  NEUTROABS 1.7 - 7.7 K/uL 5.7 - -  LYMPHSABS 0.7 - 4.0 K/uL 1.6 - -     There is no height or weight on file to calculate BMI.  Orders:  Orders Placed This Encounter  Procedures   Ambulatory referral to Vascular Surgery   No orders of the defined types were placed in this encounter.  Procedures: No procedures performed  Clinical Data: No additional findings.  ROS:  All other systems negative, except as noted in the HPI. Review of Systems  Objective: Vital Signs: There were no vitals taken for this visit.  Specialty Comments:  No specialty comments available.  PMFS History: Patient Active Problem List   Diagnosis Date Noted   Venous stasis    History of endocarditis    Bradycardia    Orthostasis    Chest pain 09/10/2018   Hypovolemia associated with hemodialysis 07/11/2018   Weakness    Thrombocytopenia (HCC) 07/10/2018   Paroxysmal atrial fibrillation ([place]) 05/26/2017   Balanitis 04/25/2017   Penile swelling    Scrotal swelling    UTI (urinary tract infection) 04/13/2017   Anemia of chronic disease    Acute encephalopathy 04/12/2017   Urinary retention 04/10/2017    Bacteremia    RUQ abdominal pain    Gross hematuria    Other pancytopenia ([place])    Sepsis ([place]) 03/21/2017   Diarrhea 03/03/2017   Retained lens material following cataract surgery of both eyes 11/08/2016   Venous stasis ulcers of both lower extremities ([place]) 08/07/2015   Varicose veins of lower extremities with complications 99/37/1696   Malfunction of arteriovenous dialysis fistula (HCC)    CHF (congestive heart failure) ([place]) 06/23/2015   Lymphedema distichiasis syndrome with kidney disease and diabetes mellitus ([place]) 04/06/2015   Lymphedema of lower extremity 04/04/2015   Type II diabetes mellitus with nephropathy ([place]) 04/04/2015   MGUS (monoclonal gammopathy of unknown significance) 06/12/2014   Chronic diastolic heart failure ([place]) 10/30/2013   Special screening for malignant neoplasms, colon 07/09/2013   Proliferative diabetic retinopathy ([place]) 10/23/2012   Knee pain 08/21/2012   Mass of thigh 08/21/2012   FEVER UNSPECIFIED 05/06/2010   ORGANIC IMPOTENCE 12/02/2009   ONYCHOMYCOSIS 07/17/2009   Lymphedema 05/06/2009   ESRD on dialysis ([place]) 06/09/2008   Hyperlipidemia 03/19/2007   Class 2 severe obesity due to excess calories with serious comorbidity and body mass index (BMI) of 35.0 to 35.9 in adult [facility]) 02/08/2007   Essential hypertension 02/08/2007   Atherosclerosis of native arteries of extremity with intermittent claudication ([place]) 02/08/2007   Past Medical History:  Diagnosis Date   Arthritis    Asthma    as a child   Chronic cystitis    Chronic diastolic heart failure ([place])    Chronic kidney disease    HD pt, 3 times a week.   Diabetes mellitus    Dysrhythmia    GERD (gastroesophageal reflux disease)    H/O hiatal hernia    states it's been fixed   Hyperlipidemia    Hypertension    Lymphedema    Morbid obesity ([place])    NEPHROLITHIASIS, HX OF 12/02/2009   Qualifier: Diagnosis of  By: [name], Cat     PVD  (peripheral vascular disease) ([place])    Renal insufficiency    UMBILICAL HERNIA 7/89/3810   Qualifier: History of  By: [name] MD, Shane     Venous insufficiency     Family History  Problem Relation Age of Onset   Diabetes Mother    Heart disease Mother    Diabetes Brother     Past Surgical History:  Procedure Laterality Date   ABDOMINAL AORTOGRAM W/LOWER EXTREMITY N/A 05/19/2017   Procedure: Abdominal Aortogram w/Lower Extremity;  Surgeon: [name], MD;  Location: [facility];  Service: Cardiovascular;  Laterality: N/A;   AMPUTATION     Right and left fifth  toes.    AMPUTATION TOE     emoval of both little toes   AV FISTULA PLACEMENT Left 03/21/2014   Procedure: ARTERIOVENOUS (AV) FISTULA CREATION with ultrasound;  Surgeon: [name], MD;  Location: [facility];  Service: Vascular;  Laterality: Left;   COLONOSCOPY     EYE SURGERY Bilateral    cataract and lens implant   HERNIA REPAIR     LIGATION OF COMPETING BRANCHES OF ARTERIOVENOUS FISTULA Left 06/29/2015   Procedure: LIGATION OF LEFT ARM RADIOCEPHALIC ARTERIOVENOUS FISTULA SIDE BRANCHES;  Surgeon: Conrad Shelly, MD;  Location: [facility];  Service: Vascular;  Laterality: Left;   MULTIPLE EXTRACTIONS WITH ALVEOLOPLASTY N/A 04/07/2017   Procedure: Extraction of tooth #'s 1-11, 13, 14,16, 20-23, and 26-28 with alveoloplasty;  Surgeon: Lenn Cal, DDS;  Location: [facility];  Service: Oral Surgery;  Laterality: N/A;   Popliteal to posterior tibial bypass     2006   R knee arthoscopic repair of meniscus     UMBILICAL HERNIA REPAIR     Social History   Occupational History    Employer: DISABLED   Tobacco Use   Smoking status: Former Smoker    Quit date: 06/06/1979    Years since quitting: 41.5   Smokeless tobacco: Former Counsellor Use: Never used  Substance and Sexual Activity   Alcohol use: No    Alcohol/week: 0.0 standard drinks    Comment:   "years ago" , none now   Drug  use: No   Sexual activity: Never

## 2020-11-28 DIAGNOSIS — N2581 Secondary hyperparathyroidism of renal origin: Secondary | ICD-10-CM | POA: Diagnosis not present

## 2020-11-28 DIAGNOSIS — L299 Pruritus, unspecified: Secondary | ICD-10-CM | POA: Diagnosis not present

## 2020-11-28 DIAGNOSIS — N186 End stage renal disease: Secondary | ICD-10-CM | POA: Diagnosis not present

## 2020-11-28 DIAGNOSIS — D688 Other specified coagulation defects: Secondary | ICD-10-CM | POA: Diagnosis not present

## 2020-11-28 DIAGNOSIS — Z992 Dependence on renal dialysis: Secondary | ICD-10-CM | POA: Diagnosis not present

## 2020-11-28 DIAGNOSIS — E1151 Type 2 diabetes mellitus with diabetic peripheral angiopathy without gangrene: Secondary | ICD-10-CM | POA: Diagnosis not present

## 2020-11-28 DIAGNOSIS — D509 Iron deficiency anemia, unspecified: Secondary | ICD-10-CM | POA: Diagnosis not present

## 2020-12-01 ENCOUNTER — Ambulatory Visit: Payer: Medicare Other | Admitting: Orthopedic Surgery

## 2020-12-01 ENCOUNTER — Encounter: Payer: Self-pay | Admitting: Orthopedic Surgery

## 2020-12-01 VITALS — Ht 70.0 in | Wt 225.0 lb

## 2020-12-01 DIAGNOSIS — L97529 Non-pressure chronic ulcer of other part of left foot with unspecified severity: Secondary | ICD-10-CM

## 2020-12-01 DIAGNOSIS — I87333 Chronic venous hypertension (idiopathic) with ulcer and inflammation of bilateral lower extremity: Secondary | ICD-10-CM | POA: Diagnosis not present

## 2020-12-01 DIAGNOSIS — D509 Iron deficiency anemia, unspecified: Secondary | ICD-10-CM | POA: Diagnosis not present

## 2020-12-01 DIAGNOSIS — E1151 Type 2 diabetes mellitus with diabetic peripheral angiopathy without gangrene: Secondary | ICD-10-CM | POA: Diagnosis not present

## 2020-12-01 DIAGNOSIS — D688 Other specified coagulation defects: Secondary | ICD-10-CM | POA: Diagnosis not present

## 2020-12-01 DIAGNOSIS — L97919 Non-pressure chronic ulcer of unspecified part of right lower leg with unspecified severity: Secondary | ICD-10-CM

## 2020-12-01 DIAGNOSIS — N2581 Secondary hyperparathyroidism of renal origin: Secondary | ICD-10-CM | POA: Diagnosis not present

## 2020-12-01 DIAGNOSIS — L299 Pruritus, unspecified: Secondary | ICD-10-CM | POA: Diagnosis not present

## 2020-12-01 DIAGNOSIS — L97929 Non-pressure chronic ulcer of unspecified part of left lower leg with unspecified severity: Secondary | ICD-10-CM | POA: Diagnosis not present

## 2020-12-01 DIAGNOSIS — Z992 Dependence on renal dialysis: Secondary | ICD-10-CM | POA: Diagnosis not present

## 2020-12-01 DIAGNOSIS — N186 End stage renal disease: Secondary | ICD-10-CM | POA: Diagnosis not present

## 2020-12-01 DIAGNOSIS — E11621 Type 2 diabetes mellitus with foot ulcer: Secondary | ICD-10-CM

## 2020-12-01 NOTE — Progress Notes (Signed)
Office Visit Note   Patient: [name]           Date of Birth: 1951-11-11           MRN: [id number] Visit Date: 12/01/2020              Requested by: [name], [name] [place],  [place] [id number] PCP: [name], [name]  Chief Complaint  Patient presents with  . Left Leg - Follow-up    Compression wraps   . Right Leg - Follow-up      HPI: Patient is a 69 year old gentleman who presents in follow-up for massive swelling both legs with venous insufficiency as well as arterial insufficiency.  Patient has ulcerations to the both great toes.  Patient states that this was from poor fitting shoes.  Patient's legs were wrapped with a compression wrap he has not remove the socks that were put on from his last visit.  Assessment & Plan: Visit Diagnoses:  1. Chronic venous hypertension (idiopathic) with ulcer and inflammation of bilateral lower extremity (HCC)   2. Ulcer of left great toe due to diabetes mellitus ([place])     Plan: We will need to proceed with daily cleansing of his legs.  We will apply dry dressing to both great toes Ace wraps from the metatarsal heads to the tibial tubercle he will wash his legs daily with soap and water apply dry dressing to the great toes.  Follow-Up Instructions: Return in about 2 weeks (around 12/15/2020).   Ortho Exam  Patient is alert, oriented, no adenopathy, well-dressed, normal affect, normal respiratory effort. Examination there is a dry eschar over the medial border of the left great toe there is a large blister ulcer which is decompressed on the right great toe there is healthy granulation tissue the base of the right great toe and this ulcerative area is 5 cm in diameter 1 mm deep the left great toe eschar is 3 cm in diameter.  There is dark brawny skin color changes in both legs but the swelling has decreased since the compression dressings were applied there are no open ulcers on the legs.  The Doppler was used and he  does have a dopplerable biphasic dorsalis pedis and posterior tibial pulse bilaterally no acute changes in his arterial inflow.  Patient has thick discolored onychomycosis and the nails were trimmed x9.  Imaging: No results found. No images are attached to the encounter.  Labs: Lab Results  Component Value Date   HGBA1C 6.2 (H) 07/11/2018   HGBA1C 6.8 03/13/2017   HGBA1C 5.7 12/30/2015   ESRSEDRATE 70 (H) 04/04/2015   ESRSEDRATE 81 (H) 07/14/2011   ESRSEDRATE 117 (H) 06/28/2011   CRP 6.1 (H) 04/04/2015   CRP 0.9 (H) 07/14/2011   CRP 4.6 (H) 06/28/2011   LABURIC 9.7 (H) 02/23/2015   REPTSTATUS 09/15/2018 FINAL 09/10/2018   GRAMSTAIN  06/24/2011    RARE WBC PRESENT,BOTH PMN AND MONONUCLEAR NO ORGANISMS SEEN   GRAMSTAIN  06/24/2011    RARE WBC PRESENT,BOTH PMN AND MONONUCLEAR NO ORGANISMS SEEN   CULT  09/10/2018    NO GROWTH 5 DAYS Performed at [facility], [place] [address], [place],  [address]    Doy Hutching [facility] MORGANII 08/21/2015     Lab Results  Component Value Date   ALBUMIN 2.7 (L) 09/12/2018   ALBUMIN 3.1 (L) 09/11/2018   ALBUMIN 3.1 (L) 07/10/2018   LABURIC 9.7 (H) 02/23/2015  Lab Results  Component Value Date   MG 2.0 04/13/2017   MG 1.9 03/22/2017   MG 1.7 03/04/2014   No results found for: VD25OH  No results found for: PREALBUMIN CBC EXTENDED Latest Ref Rng & Units 11/11/2020 09/12/2018 09/11/2018  WBC 4.0 - 10.5 K/uL 8.1 7.2 8.1  RBC 4.22 - 5.81 MIL/uL 3.68(L) 3.25(L) 3.46(L)  HGB 13.0 - 17.0 g/dL 10.9(L) 9.9(L) 10.5(L)  HCT 39.0 - 52.0 % 33.8(L) 30.0(L) 32.8(L)  PLT 150 - 400 K/uL 151 92(L) 82(L)  NEUTROABS 1.7 - 7.7 K/uL 5.7 - -  LYMPHSABS 0.7 - 4.0 K/uL 1.6 - -     Body mass index is 32.28 kg/m.  Orders:  No orders of the defined types were placed in this encounter.  No orders of the defined types were placed in this encounter.    Procedures: No procedures performed  Clinical Data: No additional  findings.  ROS:  All other systems negative, except as noted in the HPI. Review of Systems  Objective: Vital Signs: Ht 5\' 10"  (1.778 m)   Wt 225 lb (102.1 kg)   BMI 32.28 kg/m   Specialty Comments:  No specialty comments available.  PMFS History: Patient Active Problem List   Diagnosis Date Noted  . Venous stasis   . History of endocarditis   . Bradycardia   . Orthostasis   . Chest pain 09/10/2018  . Hypovolemia associated with hemodialysis 07/11/2018  . Weakness   . Thrombocytopenia ([place]) 07/10/2018  . Paroxysmal atrial fibrillation ([place]) 05/26/2017  . Balanitis 04/25/2017  . Penile swelling   . Scrotal swelling   . UTI (urinary tract infection) 04/13/2017  . Anemia of chronic disease   . Acute encephalopathy 04/12/2017  . Urinary retention 04/10/2017  . Bacteremia   . RUQ abdominal pain   . Gross hematuria   . Other pancytopenia ([place])   . Sepsis ([place]) 03/21/2017  . Diarrhea 03/03/2017  . Retained lens material following cataract surgery of both eyes 11/08/2016  . Venous stasis ulcers of both lower extremities ([place]) 08/07/2015  . Varicose veins of lower extremities with complications 46/56/8127  . Malfunction of arteriovenous dialysis fistula ([place])   . CHF (congestive heart failure) ([place]) 06/23/2015  . Lymphedema distichiasis syndrome with kidney disease and diabetes mellitus ([place]) 04/06/2015  . Lymphedema of lower extremity 04/04/2015  . Type II diabetes mellitus with nephropathy ([place]) 04/04/2015  . MGUS (monoclonal gammopathy of unknown significance) 06/12/2014  . Chronic diastolic heart failure ([place]) 10/30/2013  . Special screening for malignant neoplasms, colon 07/09/2013  . Proliferative diabetic retinopathy ([place]) 10/23/2012  . Knee pain 08/21/2012  . Mass of thigh 08/21/2012  . FEVER UNSPECIFIED 05/06/2010  . ORGANIC IMPOTENCE 12/02/2009  . ONYCHOMYCOSIS 07/17/2009  . Lymphedema 05/06/2009  . ESRD on dialysis ([place]) 06/09/2008  . Hyperlipidemia  03/19/2007  . Class 2 severe obesity due to excess calories with serious comorbidity and body mass index (BMI) of 35.0 to 35.9 in adult ([place]) 02/08/2007  . Essential hypertension 02/08/2007  . Atherosclerosis of native arteries of extremity with intermittent claudication ([place]) 02/08/2007   Past Medical History:  Diagnosis Date  . Arthritis   . Asthma    as a child  . Chronic cystitis   . Chronic diastolic heart failure ([name])   . Chronic kidney disease    HD pt, 3 times a week.  . Diabetes mellitus   . Dysrhythmia   . GERD (gastroesophageal reflux disease)   . H/O hiatal hernia  states it's been fixed  . Hyperlipidemia   . Hypertension   . Lymphedema   . Morbid obesity ([place])   . NEPHROLITHIASIS, HX OF 12/02/2009   Qualifier: Diagnosis of  By: [name], Cat    . PVD (peripheral vascular disease) ([place])   . Renal insufficiency   . UMBILICAL HERNIA 7/67/2094   Qualifier: History of  By: [name] MD, Audelia [place]on    . Venous insufficiency     Family History  Problem Relation Age of Onset  . Diabetes Mother   . Heart disease Mother   . Diabetes Brother     Past Surgical History:  Procedure Laterality Date  . ABDOMINAL AORTOGRAM W/LOWER EXTREMITY N/A 05/19/2017   Procedure: Abdominal Aortogram w/Lower Extremity;  Surgeon: [name], MD;  Location: [facility];  Service: Cardiovascular;  Laterality: N/A;  . AMPUTATION     Right and left fifth toes.   . AMPUTATION TOE     emoval of both little toes  . AV FISTULA PLACEMENT Left 03/21/2014   Procedure: ARTERIOVENOUS (AV) FISTULA CREATION with ultrasound;  Surgeon: [name], MD;  Location: [facility];  Service: Vascular;  Laterality: Left;  . COLONOSCOPY    . EYE SURGERY Bilateral    cataract and lens implant  . HERNIA REPAIR    . LIGATION OF COMPETING BRANCHES OF ARTERIOVENOUS FISTULA Left 06/29/2015   Procedure: LIGATION OF LEFT ARM RADIOCEPHALIC ARTERIOVENOUS FISTULA SIDE BRANCHES;  Surgeon: Conrad [place], MD;  Location: [facility];  Service: Vascular;  Laterality: Left;  [name] MULTIPLE EXTRACTIONS WITH ALVEOLOPLASTY N/A 04/07/2017   Procedure: Extraction of tooth #'s 1-11, 13, 14,16, 20-23, and 26-28 with alveoloplasty;  Surgeon: Lenn Cal, DDS;  Location: [facility];  Service: Oral Surgery;  Laterality: N/A;  . Popliteal to posterior tibial bypass     2006  . R knee arthoscopic repair of meniscus    . UMBILICAL HERNIA REPAIR     Social History   Occupational History    Employer: DISABLED   Tobacco Use  . Smoking status: Former Smoker    Quit date: 06/06/1979    Years since quitting: 41.5  . Smokeless tobacco: Former Network engineer  . Vaping Use: Never used  Substance and Sexual Activity  . Alcohol use: No    Alcohol/week: 0.0 standard drinks    Comment:   "years ago" , none now  . Drug use: No  . Sexual activity: Never

## 2020-12-03 DIAGNOSIS — E1151 Type 2 diabetes mellitus with diabetic peripheral angiopathy without gangrene: Secondary | ICD-10-CM | POA: Diagnosis not present

## 2020-12-03 DIAGNOSIS — N186 End stage renal disease: Secondary | ICD-10-CM | POA: Diagnosis not present

## 2020-12-03 DIAGNOSIS — D509 Iron deficiency anemia, unspecified: Secondary | ICD-10-CM | POA: Diagnosis not present

## 2020-12-03 DIAGNOSIS — Z992 Dependence on renal dialysis: Secondary | ICD-10-CM | POA: Diagnosis not present

## 2020-12-03 DIAGNOSIS — D688 Other specified coagulation defects: Secondary | ICD-10-CM | POA: Diagnosis not present

## 2020-12-03 DIAGNOSIS — L299 Pruritus, unspecified: Secondary | ICD-10-CM | POA: Diagnosis not present

## 2020-12-03 DIAGNOSIS — N2581 Secondary hyperparathyroidism of renal origin: Secondary | ICD-10-CM | POA: Diagnosis not present

## 2020-12-06 DIAGNOSIS — E1151 Type 2 diabetes mellitus with diabetic peripheral angiopathy without gangrene: Secondary | ICD-10-CM | POA: Diagnosis not present

## 2020-12-06 DIAGNOSIS — N186 End stage renal disease: Secondary | ICD-10-CM | POA: Diagnosis not present

## 2020-12-06 DIAGNOSIS — D688 Other specified coagulation defects: Secondary | ICD-10-CM | POA: Diagnosis not present

## 2020-12-06 DIAGNOSIS — N2581 Secondary hyperparathyroidism of renal origin: Secondary | ICD-10-CM | POA: Diagnosis not present

## 2020-12-06 DIAGNOSIS — D509 Iron deficiency anemia, unspecified: Secondary | ICD-10-CM | POA: Diagnosis not present

## 2020-12-06 DIAGNOSIS — L299 Pruritus, unspecified: Secondary | ICD-10-CM | POA: Diagnosis not present

## 2020-12-06 DIAGNOSIS — Z992 Dependence on renal dialysis: Secondary | ICD-10-CM | POA: Diagnosis not present

## 2020-12-08 DIAGNOSIS — Z992 Dependence on renal dialysis: Secondary | ICD-10-CM | POA: Diagnosis not present

## 2020-12-08 DIAGNOSIS — D509 Iron deficiency anemia, unspecified: Secondary | ICD-10-CM | POA: Diagnosis not present

## 2020-12-08 DIAGNOSIS — D688 Other specified coagulation defects: Secondary | ICD-10-CM | POA: Diagnosis not present

## 2020-12-08 DIAGNOSIS — L299 Pruritus, unspecified: Secondary | ICD-10-CM | POA: Diagnosis not present

## 2020-12-08 DIAGNOSIS — E1151 Type 2 diabetes mellitus with diabetic peripheral angiopathy without gangrene: Secondary | ICD-10-CM | POA: Diagnosis not present

## 2020-12-08 DIAGNOSIS — N186 End stage renal disease: Secondary | ICD-10-CM | POA: Diagnosis not present

## 2020-12-08 DIAGNOSIS — N2581 Secondary hyperparathyroidism of renal origin: Secondary | ICD-10-CM | POA: Diagnosis not present

## 2020-12-10 DIAGNOSIS — D688 Other specified coagulation defects: Secondary | ICD-10-CM | POA: Diagnosis not present

## 2020-12-10 DIAGNOSIS — Z992 Dependence on renal dialysis: Secondary | ICD-10-CM | POA: Diagnosis not present

## 2020-12-10 DIAGNOSIS — E1151 Type 2 diabetes mellitus with diabetic peripheral angiopathy without gangrene: Secondary | ICD-10-CM | POA: Diagnosis not present

## 2020-12-10 DIAGNOSIS — N186 End stage renal disease: Secondary | ICD-10-CM | POA: Diagnosis not present

## 2020-12-10 DIAGNOSIS — L299 Pruritus, unspecified: Secondary | ICD-10-CM | POA: Diagnosis not present

## 2020-12-10 DIAGNOSIS — N2581 Secondary hyperparathyroidism of renal origin: Secondary | ICD-10-CM | POA: Diagnosis not present

## 2020-12-10 DIAGNOSIS — D509 Iron deficiency anemia, unspecified: Secondary | ICD-10-CM | POA: Diagnosis not present

## 2020-12-11 DIAGNOSIS — N186 End stage renal disease: Secondary | ICD-10-CM | POA: Diagnosis not present

## 2020-12-11 DIAGNOSIS — Z992 Dependence on renal dialysis: Secondary | ICD-10-CM | POA: Diagnosis not present

## 2020-12-11 DIAGNOSIS — E1122 Type 2 diabetes mellitus with diabetic chronic kidney disease: Secondary | ICD-10-CM | POA: Diagnosis not present

## 2020-12-15 ENCOUNTER — Ambulatory Visit: Payer: Medicare Other | Admitting: Orthopedic Surgery

## 2020-12-15 DIAGNOSIS — L97929 Non-pressure chronic ulcer of unspecified part of left lower leg with unspecified severity: Secondary | ICD-10-CM

## 2020-12-15 DIAGNOSIS — L97919 Non-pressure chronic ulcer of unspecified part of right lower leg with unspecified severity: Secondary | ICD-10-CM

## 2020-12-15 DIAGNOSIS — I87333 Chronic venous hypertension (idiopathic) with ulcer and inflammation of bilateral lower extremity: Secondary | ICD-10-CM

## 2020-12-16 ENCOUNTER — Other Ambulatory Visit: Payer: Self-pay | Admitting: *Deleted

## 2020-12-16 ENCOUNTER — Encounter: Payer: Self-pay | Admitting: Orthopedic Surgery

## 2020-12-16 DIAGNOSIS — I83891 Varicose veins of right lower extremities with other complications: Secondary | ICD-10-CM

## 2020-12-16 DIAGNOSIS — I83029 Varicose veins of left lower extremity with ulcer of unspecified site: Secondary | ICD-10-CM

## 2020-12-16 DIAGNOSIS — I83019 Varicose veins of right lower extremity with ulcer of unspecified site: Secondary | ICD-10-CM

## 2020-12-16 NOTE — Progress Notes (Signed)
Office Visit Note   Patient: [name]           Date of Birth: 25-Aug-1951           MRN: [id number] Visit Date: 12/15/2020              Requested by: [name], [name] [place],  [place] [address] PCP: [name], [name]  Chief Complaint  Patient presents with  . Left Leg - Follow-up  . Right Leg - Follow-up      HPI: Patient is a 70 year old gentleman who presents for venous insufficiency both legs with ischemic ulcers to the great toe bilaterally.  Patient has been using an Ace wrap there is areas of over compression and areas of increased swelling due to the irregular compression applied of the Ace wrap.  Assessment & Plan: Visit Diagnoses:  1. Chronic venous hypertension (idiopathic) with ulcer and inflammation of bilateral lower extremity (HCC)     Plan: We will apply Dynaflex dressings bilaterally follow-up twice a week for dressing changes.  Anticipate we can get patient into compression stockings and will follow expectantly the ischemic ulcers over the great toes.  Follow-Up Instructions: Return in about 1 week (around 12/22/2020) for Follow-up twice a week on Tuesdays and Fridays for compression wraps.Manson Passey Exam  Patient is alert, oriented, no adenopathy, well-dressed, normal affect, normal respiratory effort. Examination patient has persistent venous ulcers on both legs with areas of increased swelling in the areas of increased compression.  There are some dry ischemic ulcers over the great toes with no ascending cellulitis no purulent drainage.  Imaging: No results found. No images are attached to the encounter.  Labs: Lab Results  Component Value Date   HGBA1C 6.2 (H) 07/11/2018   HGBA1C 6.8 03/13/2017   HGBA1C 5.7 12/30/2015   ESRSEDRATE 70 (H) 04/04/2015   ESRSEDRATE 81 (H) 07/14/2011   ESRSEDRATE 117 (H) 06/28/2011   CRP 6.1 (H) 04/04/2015   CRP 0.9 (H) 07/14/2011   CRP 4.6 (H) 06/28/2011   LABURIC 9.7 (H) 02/23/2015    REPTSTATUS 09/15/2018 FINAL 09/10/2018   GRAMSTAIN  06/24/2011    RARE WBC PRESENT,BOTH PMN AND MONONUCLEAR NO ORGANISMS SEEN   GRAMSTAIN  06/24/2011    RARE WBC PRESENT,BOTH PMN AND MONONUCLEAR NO ORGANISMS SEEN   CULT  09/10/2018    NO GROWTH 5 DAYS Performed at [facility], [place] [address], [place], [place] [address]    LABORGA [facility] MORGANII 08/21/2015     Lab Results  Component Value Date   ALBUMIN 2.7 (L) 09/12/2018   ALBUMIN 3.1 (L) 09/11/2018   ALBUMIN 3.1 (L) 07/10/2018   LABURIC 9.7 (H) 02/23/2015    Lab Results  Component Value Date   MG 2.0 04/13/2017   MG 1.9 03/22/2017   MG 1.7 03/04/2014   No results found for: VD25OH  No results found for: PREALBUMIN CBC EXTENDED Latest Ref Rng & Units 11/11/2020 09/12/2018 09/11/2018  WBC 4.0 - 10.5 K/uL 8.1 7.2 8.1  RBC 4.22 - 5.81 MIL/uL 3.68(L) 3.25(L) 3.46(L)  HGB 13.0 - 17.0 g/dL 10.9(L) 9.9(L) 10.5(L)  HCT 39.0 - 52.0 % 33.8(L) 30.0(L) 32.8(L)  PLT 150 - 400 K/uL 151 92(L) 82(L)  NEUTROABS 1.7 - 7.7 K/uL 5.7 - -  LYMPHSABS 0.7 - 4.0 K/uL 1.6 - -     There is no height or weight on file to calculate BMI.  Orders:  No orders of the defined  types were placed in this encounter.  No orders of the defined types were placed in this encounter.    Procedures: No procedures performed  Clinical Data: No additional findings.  ROS:  All other systems negative, except as noted in the HPI. Review of Systems  Objective: Vital Signs: There were no vitals taken for this visit.  Specialty Comments:  No specialty comments available.  PMFS History: Patient Active Problem List   Diagnosis Date Noted  . Venous stasis   . History of endocarditis   . Bradycardia   . Orthostasis   . Chest pain 09/10/2018  . Hypovolemia associated with hemodialysis 07/11/2018  . Weakness   . Thrombocytopenia ([place]) 07/10/2018  . Paroxysmal atrial fibrillation ([place]) 05/26/2017  . Balanitis 04/25/2017  . Penile  swelling   . Scrotal swelling   . UTI (urinary tract infection) 04/13/2017  . Anemia of chronic disease   . Acute encephalopathy 04/12/2017  . Urinary retention 04/10/2017  . Bacteremia   . RUQ abdominal pain   . Gross hematuria   . Other pancytopenia ([place])   . Sepsis ([place]) 03/21/2017  . Diarrhea 03/03/2017  . Retained lens material following cataract surgery of both eyes 11/08/2016  . Venous stasis ulcers of both lower extremities ([place]) 08/07/2015  . Varicose veins of lower extremities with complications 08/65/7846  . Malfunction of arteriovenous dialysis fistula ([place])   . CHF (congestive heart failure) ([place]) 06/23/2015  . Lymphedema distichiasis syndrome with kidney disease and diabetes mellitus ([place]) 04/06/2015  . Lymphedema of lower extremity 04/04/2015  . Type II diabetes mellitus with nephropathy ([place]) 04/04/2015  . MGUS (monoclonal gammopathy of unknown significance) 06/12/2014  . Chronic diastolic heart failure ([place]) 10/30/2013  . Special screening for malignant neoplasms, colon 07/09/2013  . Proliferative diabetic retinopathy ([place]) 10/23/2012  . Knee pain 08/21/2012  . Mass of thigh 08/21/2012  . FEVER UNSPECIFIED 05/06/2010  . ORGANIC IMPOTENCE 12/02/2009  . ONYCHOMYCOSIS 07/17/2009  . Lymphedema 05/06/2009  . ESRD on dialysis ([place]) 06/09/2008  . Hyperlipidemia 03/19/2007  . Class 2 severe obesity due to excess calories with serious comorbidity and body mass index (BMI) of 35.0 to 35.9 in adult ([place]) 02/08/2007  . Essential hypertension 02/08/2007  . Atherosclerosis of native arteries of extremity with intermittent claudication ([place]) 02/08/2007   Past Medical History:  Diagnosis Date  . Arthritis   . Asthma    as a child  . Chronic cystitis   . Chronic diastolic heart failure ([place])   . Chronic kidney disease    HD pt, 3 times a week.  . Diabetes mellitus   . Dysrhythmia   . GERD (gastroesophageal reflux disease)   . H/O hiatal hernia    states it's been  fixed  . Hyperlipidemia   . Hypertension   . Lymphedema   . Morbid obesity ([place])   . NEPHROLITHIASIS, HX OF 12/02/2009   Qualifier: Diagnosis of  By: [name], Cat    . PVD (peripheral vascular disease) ([place])   . Renal insufficiency   . UMBILICAL HERNIA 9/62/9528   Qualifier: History of  By: [name] MD, Audelia Acton    . Venous insufficiency     Family History  Problem Relation Age of Onset  . Diabetes Mother   . Heart disease Mother   . Diabetes Brother     Past Surgical History:  Procedure Laterality Date  . ABDOMINAL AORTOGRAM W/LOWER EXTREMITY N/A 05/19/2017   Procedure: Abdominal Aortogram w/Lower Extremity;  Surgeon: [name], MD;  Location:  [facility];  Service: Cardiovascular;  Laterality: N/A;  . AMPUTATION     Right and left fifth toes.   . AMPUTATION TOE     emoval of both little toes  . AV FISTULA PLACEMENT Left 03/21/2014   Procedure: ARTERIOVENOUS (AV) FISTULA CREATION with ultrasound;  Surgeon: [name], MD;  Location: [facility];  Service: Vascular;  Laterality: Left;  . COLONOSCOPY    . EYE SURGERY Bilateral    cataract and lens implant  . HERNIA REPAIR    . LIGATION OF COMPETING BRANCHES OF ARTERIOVENOUS FISTULA Left 06/29/2015   Procedure: LIGATION OF LEFT ARM RADIOCEPHALIC ARTERIOVENOUS FISTULA SIDE BRANCHES;  Surgeon: Conrad Fertile, MD;  Location: [facility];  Service: Vascular;  Laterality: Left;  [name] MULTIPLE EXTRACTIONS WITH ALVEOLOPLASTY N/A 04/07/2017   Procedure: Extraction of tooth #'s 1-11, 13, 14,16, 20-23, and 26-28 with alveoloplasty;  Surgeon: Lenn Cal, DDS;  Location: [facility];  Service: Oral Surgery;  Laterality: N/A;  . Popliteal to posterior tibial bypass     2006  . R knee arthoscopic repair of meniscus    . UMBILICAL HERNIA REPAIR     Social History   Occupational History    Employer: DISABLED   Tobacco Use  . Smoking status: Former Smoker    Quit date: 06/06/1979    Years since quitting: 41.5  . Smokeless tobacco: Former Engineer, maintenance (IT)  . Vaping Use: Never used  Substance and Sexual Activity  . Alcohol use: No    Alcohol/week: 0.0 standard drinks    Comment:   "years ago" , none now  . Drug use: No  . Sexual activity: Never

## 2020-12-18 ENCOUNTER — Ambulatory Visit (INDEPENDENT_AMBULATORY_CARE_PROVIDER_SITE_OTHER): Payer: Medicare Other | Admitting: Physician Assistant

## 2020-12-18 ENCOUNTER — Encounter: Payer: Self-pay | Admitting: Physician Assistant

## 2020-12-18 VITALS — Ht 70.0 in | Wt 225.0 lb

## 2020-12-18 DIAGNOSIS — E11621 Type 2 diabetes mellitus with foot ulcer: Secondary | ICD-10-CM

## 2020-12-18 DIAGNOSIS — L97529 Non-pressure chronic ulcer of other part of left foot with unspecified severity: Secondary | ICD-10-CM

## 2020-12-18 DIAGNOSIS — I878 Other specified disorders of veins: Secondary | ICD-10-CM | POA: Diagnosis not present

## 2020-12-18 MED ORDER — DOXYCYCLINE HYCLATE 100 MG PO TABS: 100.0000 mg | ORAL_TABLET | Freq: Two times a day (BID) | ORAL | 0 refills | Status: DC

## 2020-12-18 NOTE — Progress Notes (Signed)
Office Visit Note   Patient: [name]           Date of Birth: 1951-03-15           MRN: [id number] Visit Date: 12/18/2020              Requested by: [name], [name] [place],  [place] [address] PCP: [name], [name]  Chief Complaint  Patient presents with  . Right Leg - Follow-up  . Left Leg - Follow-up      HPI: The patient is a pleasant 70 year old gentleman who comes in for rewrapping of his bilateral lower extremities.  These were initially wrapped on Tuesday.  He did not change the Band-Aids on his toes which have gangrenous ulcerations on them.  He has an appointment with vascular on January 21  Assessment & Plan: Visit Diagnoses: No diagnosis found.  Plan: He already has planned an appointment with Dr. Sharol Given on Monday.  We will rewrap him today putting dry dressing on the toes as he is unable to change the Band-Aids.  I do think that the toes have gotten a bit worse though there is no ascending cellulitis or signs of spread to other parts of his legs which actually look better.  Will prescribe him some doxycycline  Follow-Up Instructions: No follow-ups on file.   Ortho Exam  Patient is alert, oriented, no adenopathy, well-dressed, normal affect, normal respiratory effort. Bilateral lower extremities decreased swelling especially on the left.  Compartments are soft.  No cellulitis.  Gangrenous ulceration on the left great toe and the plantar surface of the right great toe.  Both have surrounding macerated tissue and are somewhat wet in appearance.  However there is no cellulitis ascending into the foot or the other digits  Imaging: No results found. No images are attached to the encounter.  Labs: Lab Results  Component Value Date   HGBA1C 6.2 (H) 07/11/2018   HGBA1C 6.8 03/13/2017   HGBA1C 5.7 12/30/2015   ESRSEDRATE 70 (H) 04/04/2015   ESRSEDRATE 81 (H) 07/14/2011   ESRSEDRATE 117 (H) 06/28/2011   CRP 6.1 (H) 04/04/2015   CRP 0.9  (H) 07/14/2011   CRP 4.6 (H) 06/28/2011   LABURIC 9.7 (H) 02/23/2015   REPTSTATUS 09/15/2018 FINAL 09/10/2018   GRAMSTAIN  06/24/2011    RARE WBC PRESENT,BOTH PMN AND MONONUCLEAR NO ORGANISMS SEEN   GRAMSTAIN  06/24/2011    RARE WBC PRESENT,BOTH PMN AND MONONUCLEAR NO ORGANISMS SEEN   CULT  09/10/2018    NO GROWTH 5 DAYS Performed at [facility], [place] [address] [place],  [address]    LABORGA [facility] MORGANII 08/21/2015     Lab Results  Component Value Date   ALBUMIN 2.7 (L) 09/12/2018   ALBUMIN 3.1 (L) 09/11/2018   ALBUMIN 3.1 (L) 07/10/2018   LABURIC 9.7 (H) 02/23/2015    Lab Results  Component Value Date   MG 2.0 04/13/2017   MG 1.9 03/22/2017   MG 1.7 03/04/2014   No results found for: VD25OH  No results found for: PREALBUMIN CBC EXTENDED Latest Ref Rng & Units 11/11/2020 09/12/2018 09/11/2018  WBC 4.0 - 10.5 K/uL 8.1 7.2 8.1  RBC 4.22 - 5.81 MIL/uL 3.68(L) 3.25(L) 3.46(L)  HGB 13.0 - 17.0 g/dL 10.9(L) 9.9(L) 10.5(L)  HCT 39.0 - 52.0 % 33.8(L) 30.0(L) 32.8(L)  PLT 150 - 400 K/uL 151 92(L) 82(L)  NEUTROABS 1.7 - 7.7 K/uL 5.7 - -  LYMPHSABS 0.7 -  4.0 K/uL 1.6 - -     Body mass index is 32.28 kg/m.  Orders:  No orders of the defined types were placed in this encounter.  Meds ordered this encounter  Medications  . doxycycline (VIBRA-TABS) 100 MG tablet    Sig: Take 1 tablet (100 mg total) by mouth 2 (two) times daily.    Dispense:  60 tablet    Refill:  0     Procedures: No procedures performed  Clinical Data: No additional findings.  ROS:  All other systems negative, except as noted in the HPI. Review of Systems  Objective: Vital Signs: Ht 5\' 10"  (1.778 m)   Wt 225 lb (102.1 kg)   BMI 32.28 kg/m   Specialty Comments:  No specialty comments available.  PMFS History: Patient Active Problem List   Diagnosis Date Noted  . Venous stasis   . History of endocarditis   . Bradycardia   . Orthostasis   . Chest pain 09/10/2018   . Hypovolemia associated with hemodialysis 07/11/2018  . Weakness   . Thrombocytopenia ([place]) 07/10/2018  . Paroxysmal atrial fibrillation ([place]) 05/26/2017  . Balanitis 04/25/2017  . Penile swelling   . Scrotal swelling   . UTI (urinary tract infection) 04/13/2017  . Anemia of chronic disease   . Acute encephalopathy 04/12/2017  . Urinary retention 04/10/2017  . Bacteremia   . RUQ abdominal pain   . Gross hematuria   . Other pancytopenia ([place])   . Sepsis ([place]) 03/21/2017  . Diarrhea 03/03/2017  . Retained lens material following cataract surgery of both eyes 11/08/2016  . Venous stasis ulcers of both lower extremities ([place]) 08/07/2015  . Varicose veins of lower extremities with complications 73/53/2992  . Malfunction of arteriovenous dialysis fistula ([place])   . CHF (congestive heart failure) ([place]) 06/23/2015  . Lymphedema distichiasis syndrome with kidney disease and diabetes mellitus ([place]) 04/06/2015  . Lymphedema of lower extremity 04/04/2015  . Type II diabetes mellitus with nephropathy ([place]) 04/04/2015  . MGUS (monoclonal gammopathy of unknown significance) 06/12/2014  . Chronic diastolic heart failure ([place]) 10/30/2013  . Special screening for malignant neoplasms, colon 07/09/2013  . Proliferative diabetic retinopathy ([place]) 10/23/2012  . Knee pain 08/21/2012  . Mass of thigh 08/21/2012  . FEVER UNSPECIFIED 05/06/2010  . ORGANIC IMPOTENCE 12/02/2009  . ONYCHOMYCOSIS 07/17/2009  . Lymphedema 05/06/2009  . ESRD on dialysis ([place]) 06/09/2008  . Hyperlipidemia 03/19/2007  . Class 2 severe obesity due to excess calories with serious comorbidity and body mass index (BMI) of 35.0 to 35.9 in adult ([place]) 02/08/2007  . Essential hypertension 02/08/2007  . Atherosclerosis of native arteries of extremity with intermittent claudication ([place]) 02/08/2007   Past Medical History:  Diagnosis Date  . Arthritis   . Asthma    as a child  . Chronic cystitis   . Chronic diastolic heart  failure ([place])   . Chronic kidney disease    HD pt, 3 times a week.  . Diabetes mellitus   . Dysrhythmia   . GERD (gastroesophageal reflux disease)   . H/O hiatal hernia    states it's been fixed  . Hyperlipidemia   . Hypertension   . Lymphedema   . Morbid obesity ([place])   . NEPHROLITHIASIS, HX OF 12/02/2009   Qualifier: Diagnosis of  By: [name], Cat    . PVD (peripheral vascular disease) ([place])   . Renal insufficiency   . UMBILICAL HERNIA 04/06/8340   Qualifier: History of  By: [name] MD, Audelia Acton    .  Venous insufficiency     Family History  Problem Relation Age of Onset  . Diabetes Mother   . Heart disease Mother   . Diabetes Brother     Past Surgical History:  Procedure Laterality Date  . ABDOMINAL AORTOGRAM W/LOWER EXTREMITY N/A 05/19/2017   Procedure: Abdominal Aortogram w/Lower Extremity;  Surgeon: [name], MD;  Location: [facility];  Service: Cardiovascular;  Laterality: N/A;  . AMPUTATION     Right and left fifth toes.   . AMPUTATION TOE     emoval of both little toes  . AV FISTULA PLACEMENT Left 03/21/2014   Procedure: ARTERIOVENOUS (AV) FISTULA CREATION with ultrasound;  Surgeon: [name], MD;  Location: [facility];  Service: Vascular;  Laterality: Left;  . COLONOSCOPY    . EYE SURGERY Bilateral    cataract and lens implant  . HERNIA REPAIR    . LIGATION OF COMPETING BRANCHES OF ARTERIOVENOUS FISTULA Left 06/29/2015   Procedure: LIGATION OF LEFT ARM RADIOCEPHALIC ARTERIOVENOUS FISTULA SIDE BRANCHES;  Surgeon: Conrad [place], MD;  Location: [facility];  Service: Vascular;  Laterality: Left;  [name] MULTIPLE EXTRACTIONS WITH ALVEOLOPLASTY N/A 04/07/2017   Procedure: Extraction of tooth #'s 1-11, 13, 14,16, 20-23, and 26-28 with alveoloplasty;  Surgeon: Lenn Cal, DDS;  Location: [facility];  Service: Oral Surgery;  Laterality: N/A;  . Popliteal to posterior tibial bypass     2006  . R knee arthoscopic repair of meniscus    . UMBILICAL HERNIA REPAIR     Social  History   Occupational History    Employer: DISABLED   Tobacco Use  . Smoking status: Former Smoker    Quit date: 06/06/1979    Years since quitting: 41.5  . Smokeless tobacco: Former Network engineer  . Vaping Use: Never used  Substance and Sexual Activity  . Alcohol use: No    Alcohol/week: 0.0 standard drinks    Comment:   "years ago" , none now  . Drug use: No  . Sexual activity: Never

## 2020-12-21 ENCOUNTER — Ambulatory Visit (INDEPENDENT_AMBULATORY_CARE_PROVIDER_SITE_OTHER): Payer: Medicare Other | Admitting: Orthopedic Surgery

## 2020-12-21 ENCOUNTER — Encounter: Payer: Self-pay | Admitting: Orthopedic Surgery

## 2020-12-21 DIAGNOSIS — L97919 Non-pressure chronic ulcer of unspecified part of right lower leg with unspecified severity: Secondary | ICD-10-CM

## 2020-12-21 DIAGNOSIS — E11621 Type 2 diabetes mellitus with foot ulcer: Secondary | ICD-10-CM | POA: Diagnosis not present

## 2020-12-21 DIAGNOSIS — L97529 Non-pressure chronic ulcer of other part of left foot with unspecified severity: Secondary | ICD-10-CM | POA: Diagnosis not present

## 2020-12-21 DIAGNOSIS — L97929 Non-pressure chronic ulcer of unspecified part of left lower leg with unspecified severity: Secondary | ICD-10-CM

## 2020-12-21 NOTE — Progress Notes (Signed)
Office Visit Note   Patient: [name]           Date of Birth: 07/22/1951           MRN: [id number] Visit Date: 12/21/2020              Requested by: [name], [name] [place],  [place] [id number] PCP: [name], [name]  Chief Complaint  Patient presents with  . Left Leg - Follow-up  . Right Leg - Follow-up      HPI: Patient is a 70 year old gentleman who has been treated for venous insufficiency ulcers both legs.  He has had dry ischemic changes to the left great toe and has developed a new blister on the right great toe.  Assessment & Plan: Visit Diagnoses:  1. Ulcer of left great toe due to diabetes mellitus ([place])   2. Chronic venous hypertension (idiopathic) with ulcer and inflammation of bilateral lower extremity (HCC)     Plan: Continue compression wraps weekly recommended weightbearing on the right heel to unload the right great toe dry dressing changes to both great toes daily.  Follow-Up Instructions: Return in about 1 week (around 12/28/2020).   Ortho Exam  Patient is alert, oriented, no adenopathy, well-dressed, normal affect, normal respiratory effort. Examination the venous swelling in both legs is improving nicely there is no open ulcers no drainage there is dry cracked skin that is debrided.  The black eschar around the left great toe is resolving with good granulation tissue around the edges.  Right great toe has a superficial blister secondary to weightbearing.  Imaging: No results found. No images are attached to the encounter.  Labs: Lab Results  Component Value Date   HGBA1C 6.2 (H) 07/11/2018   HGBA1C 6.8 03/13/2017   HGBA1C 5.7 12/30/2015   ESRSEDRATE 70 (H) 04/04/2015   ESRSEDRATE 81 (H) 07/14/2011   ESRSEDRATE 117 (H) 06/28/2011   CRP 6.1 (H) 04/04/2015   CRP 0.9 (H) 07/14/2011   CRP 4.6 (H) 06/28/2011   LABURIC 9.7 (H) 02/23/2015   REPTSTATUS 09/15/2018 FINAL 09/10/2018   GRAMSTAIN  06/24/2011    RARE WBC  PRESENT,BOTH PMN AND MONONUCLEAR NO ORGANISMS SEEN   GRAMSTAIN  06/24/2011    RARE WBC PRESENT,BOTH PMN AND MONONUCLEAR NO ORGANISMS SEEN   CULT  09/10/2018    NO GROWTH 5 DAYS Performed at [facility], [place] [address], [place], [place] [address]    LABORGA [facility] MORGANII 08/21/2015     Lab Results  Component Value Date   ALBUMIN 2.7 (L) 09/12/2018   ALBUMIN 3.1 (L) 09/11/2018   ALBUMIN 3.1 (L) 07/10/2018   LABURIC 9.7 (H) 02/23/2015    Lab Results  Component Value Date   MG 2.0 04/13/2017   MG 1.9 03/22/2017   MG 1.7 03/04/2014   No results found for: VD25OH  No results found for: PREALBUMIN CBC EXTENDED Latest Ref Rng & Units 11/11/2020 09/12/2018 09/11/2018  WBC 4.0 - 10.5 K/uL 8.1 7.2 8.1  RBC 4.22 - 5.81 MIL/uL 3.68(L) 3.25(L) 3.46(L)  HGB 13.0 - 17.0 g/dL 10.9(L) 9.9(L) 10.5(L)  HCT 39.0 - 52.0 % 33.8(L) 30.0(L) 32.8(L)  PLT 150 - 400 K/uL 151 92(L) 82(L)  NEUTROABS 1.7 - 7.7 K/uL 5.7 - -  LYMPHSABS 0.7 - 4.0 K/uL 1.6 - -     There is no height or weight on file to calculate BMI.  Orders:  No orders of the defined types were placed  in this encounter.  No orders of the defined types were placed in this encounter.    Procedures: No procedures performed  Clinical Data: No additional findings.  ROS:  All other systems negative, except as noted in the HPI. Review of Systems  Objective: Vital Signs: There were no vitals taken for this visit.  Specialty Comments:  No specialty comments available.  PMFS History: Patient Active Problem List   Diagnosis Date Noted  . Venous stasis   . History of endocarditis   . Bradycardia   . Orthostasis   . Chest pain 09/10/2018  . Hypovolemia associated with hemodialysis 07/11/2018  . Weakness   . Thrombocytopenia ([place]) 07/10/2018  . Paroxysmal atrial fibrillation ([place]) 05/26/2017  . Balanitis 04/25/2017  . Penile swelling   . Scrotal swelling   . UTI (urinary tract infection) 04/13/2017  .  Anemia of chronic disease   . Acute encephalopathy 04/12/2017  . Urinary retention 04/10/2017  . Bacteremia   . RUQ abdominal pain   . Gross hematuria   . Other pancytopenia ([place])   . Sepsis ([place]) 03/21/2017  . Diarrhea 03/03/2017  . Retained lens material following cataract surgery of both eyes 11/08/2016  . Venous stasis ulcers of both lower extremities ([place]) 08/07/2015  . Varicose veins of lower extremities with complications 62/69/4854  . Malfunction of arteriovenous dialysis fistula ([place])   . CHF (congestive heart failure) ([place]) 06/23/2015  . Lymphedema distichiasis syndrome with kidney disease and diabetes mellitus ([place]) 04/06/2015  . Lymphedema of lower extremity 04/04/2015  . Type II diabetes mellitus with nephropathy ([place]) 04/04/2015  . MGUS (monoclonal gammopathy of unknown significance) 06/12/2014  . Chronic diastolic heart failure ([place]) 10/30/2013  . Special screening for malignant neoplasms, colon 07/09/2013  . Proliferative diabetic retinopathy ([place]) 10/23/2012  . Knee pain 08/21/2012  . Mass of thigh 08/21/2012  . FEVER UNSPECIFIED 05/06/2010  . ORGANIC IMPOTENCE 12/02/2009  . ONYCHOMYCOSIS 07/17/2009  . Lymphedema 05/06/2009  . ESRD on dialysis ([place]) 06/09/2008  . Hyperlipidemia 03/19/2007  . Class 2 severe obesity due to excess calories with serious comorbidity and body mass index (BMI) of 35.0 to 35.9 in adult ([place]) 02/08/2007  . Essential hypertension 02/08/2007  . Atherosclerosis of native arteries of extremity with intermittent claudication ([place]) 02/08/2007   Past Medical History:  Diagnosis Date  . Arthritis   . Asthma    as a child  . Chronic cystitis   . Chronic diastolic heart failure ([place])   . Chronic kidney disease    HD pt, 3 times a week.  . Diabetes mellitus   . Dysrhythmia   . GERD (gastroesophageal reflux disease)   . H/O hiatal hernia    states it's been fixed  . Hyperlipidemia   . Hypertension   . Lymphedema   . Morbid obesity ([place])    . NEPHROLITHIASIS, HX OF 12/02/2009   Qualifier: Diagnosis of  By: [name], Cat    . PVD (peripheral vascular disease) ([place])   . Renal insufficiency   . UMBILICAL HERNIA 06/07/349   Qualifier: History of  By: [name] MD, Audelia Acton    . Venous insufficiency     Family History  Problem Relation Age of Onset  . Diabetes Mother   . Heart disease Mother   . Diabetes Brother     Past Surgical History:  Procedure Laterality Date  . ABDOMINAL AORTOGRAM W/LOWER EXTREMITY N/A 05/19/2017   Procedure: Abdominal Aortogram w/Lower Extremity;  Surgeon: [name], MD;  Location: [facility] INVASIVE CV  LAB;  Service: Cardiovascular;  Laterality: N/A;  . AMPUTATION     Right and left fifth toes.   . AMPUTATION TOE     emoval of both little toes  . AV FISTULA PLACEMENT Left 03/21/2014   Procedure: ARTERIOVENOUS (AV) FISTULA CREATION with ultrasound;  Surgeon: [name], MD;  Location: [place];  Service: Vascular;  Laterality: Left;  . COLONOSCOPY    . EYE SURGERY Bilateral    cataract and lens implant  . HERNIA REPAIR    . LIGATION OF COMPETING BRANCHES OF ARTERIOVENOUS FISTULA Left 06/29/2015   Procedure: LIGATION OF LEFT ARM RADIOCEPHALIC ARTERIOVENOUS FISTULA SIDE BRANCHES;  Surgeon: Conrad El Nido, MD;  Location: [facility];  Service: Vascular;  Laterality: Left;  [name] MULTIPLE EXTRACTIONS WITH ALVEOLOPLASTY N/A 04/07/2017   Procedure: Extraction of tooth #'s 1-11, 13, 14,16, 20-23, and 26-28 with alveoloplasty;  Surgeon: Lenn Cal, DDS;  Location: [place];  Service: Oral Surgery;  Laterality: N/A;  . Popliteal to posterior tibial bypass     2006  . R knee arthoscopic repair of meniscus    . UMBILICAL HERNIA REPAIR     Social History   Occupational History    Employer: DISABLED   Tobacco Use  . Smoking status: Former Smoker    Quit date: 06/06/1979    Years since quitting: 41.5  . Smokeless tobacco: Former Network engineer  . Vaping Use: Never used  Substance and Sexual Activity  . Alcohol use:  No    Alcohol/week: 0.0 standard drinks    Comment:   "years ago" , none now  . Drug use: No  . Sexual activity: Never

## 2020-12-23 ENCOUNTER — Encounter (HOSPITAL_BASED_OUTPATIENT_CLINIC_OR_DEPARTMENT_OTHER): Payer: Medicare Other | Admitting: Physician Assistant

## 2020-12-28 ENCOUNTER — Ambulatory Visit: Payer: Medicare Other | Admitting: Physician Assistant

## 2020-12-30 ENCOUNTER — Ambulatory Visit (INDEPENDENT_AMBULATORY_CARE_PROVIDER_SITE_OTHER): Payer: Medicare Other | Admitting: Physician Assistant

## 2020-12-30 ENCOUNTER — Encounter: Payer: Self-pay | Admitting: Physician Assistant

## 2020-12-30 DIAGNOSIS — E1121 Type 2 diabetes mellitus with diabetic nephropathy: Secondary | ICD-10-CM

## 2020-12-30 NOTE — Progress Notes (Signed)
Office Visit Note   Patient: [name]           Date of Birth: [birth year]           MRN: [id number] Visit Date: 12/30/2020              Requested by: [name], [name] [place],  [place] [id number] PCP: [name], [name]  Chief Complaint  Patient presents with  . Left Leg - Follow-up  . Right Leg - Follow-up      HPI: Patient comes in today for his bilateral venous insufficiency.  We have been doing wraps.  Also bilateral great toe ulcers.  He does have a appointment scheduled with vascular tomorrow.  He is currently taking antibiotics  Assessment & Plan: Visit Diagnoses: No diagnosis found.  Plan: Improved with swelling in his lower legs we will rewrap today for 1 final time.  He will have vascular consult tomorrow.  Follow-up with Dr. Sharol Given early next week.  Given the purulent drainage I am concerned that he does have deep infection especially in the left great toe.  Follow-Up Instructions: No follow-ups on file.   Ortho Exam  Patient is alert, oriented, no adenopathy, well-dressed, normal affect, normal respiratory effort. Bilateral lower extremities there is significant wrinkling and decreased swelling in both extremities.  Great toes have gangrenous appearance though no foul odor.  On the left there is some purulent green drainage coming out from beneath black eschar this was expressed until there was bloody drainage.  No surrounding cellulitis.  Imaging: No results found. No images are attached to the encounter.  Labs: Lab Results  Component Value Date   HGBA1C 6.2 (H) 07/11/2018   HGBA1C 6.8 03/13/2017   HGBA1C 5.7 12/30/2015   ESRSEDRATE 70 (H) 04/04/2015   ESRSEDRATE 81 (H) 07/14/2011   ESRSEDRATE 117 (H) 06/28/2011   CRP 6.1 (H) 04/04/2015   CRP 0.9 (H) 07/14/2011   CRP 4.6 (H) 06/28/2011   LABURIC 9.7 (H) 02/23/2015   REPTSTATUS 09/15/2018 FINAL 09/10/2018   GRAMSTAIN  06/24/2011    RARE WBC PRESENT,BOTH PMN AND  MONONUCLEAR NO ORGANISMS SEEN   GRAMSTAIN  06/24/2011    RARE WBC PRESENT,BOTH PMN AND MONONUCLEAR NO ORGANISMS SEEN   CULT  09/10/2018    NO GROWTH 5 DAYS Performed at [facility], [place] [address] [place], [place] [address]    LABORGA [facility] [place] [facility] MORGANII 08/21/2015     Lab Results  Component Value Date   ALBUMIN 2.7 (L) 09/12/2018   ALBUMIN 3.1 (L) 09/11/2018   ALBUMIN 3.1 (L) 07/10/2018   LABURIC 9.7 (H) 02/23/2015    Lab Results  Component Value Date   MG 2.0 04/13/2017   MG 1.9 03/22/2017   MG 1.7 03/04/2014   No results found for: VD25OH  No results found for: PREALBUMIN CBC EXTENDED Latest Ref Rng & Units 11/11/2020 09/12/2018 09/11/2018  WBC 4.0 - 10.5 K/uL 8.1 7.2 8.1  RBC 4.22 - 5.81 MIL/uL 3.68(L) 3.25(L) 3.46(L)  HGB 13.0 - 17.0 g/dL 10.9(L) 9.9(L) 10.5(L)  HCT 39.0 - 52.0 % 33.8(L) 30.0(L) 32.8(L)  PLT 150 - 400 K/uL 151 92(L) 82(L)  NEUTROABS 1.7 - 7.7 K/uL 5.7 - -  LYMPHSABS 0.7 - 4.0 K/uL 1.6 - -     There is no height or weight on file to calculate BMI.  Orders:  No orders of the defined types were placed in this encounter.  No orders of the defined  types were placed in this encounter.    Procedures: No procedures performed  Clinical Data: No additional findings.  ROS:  All other systems negative, except as noted in the HPI. Review of Systems  Objective: Vital Signs: There were no vitals taken for this visit.  Specialty Comments:  No specialty comments available.  PMFS History: Patient Active Problem List   Diagnosis Date Noted  . Venous stasis   . History of endocarditis   . Bradycardia   . Orthostasis   . Chest pain 09/10/2018  . Hypovolemia associated with hemodialysis 07/11/2018  . Weakness   . Thrombocytopenia ([place]) 07/10/2018  . Paroxysmal atrial fibrillation ([place]) 05/26/2017  . Balanitis 04/25/2017  . Penile swelling   . Scrotal swelling   . UTI (urinary tract infection) 04/13/2017  . Anemia of chronic  disease   . Acute encephalopathy 04/12/2017  . Urinary retention 04/10/2017  . Bacteremia   . RUQ abdominal pain   . Gross hematuria   . Other pancytopenia ([place])   . Sepsis ([place]) 03/21/2017  . Diarrhea 03/03/2017  . Retained lens material following cataract surgery of both eyes 11/08/2016  . Venous stasis ulcers of both lower extremities ([place]) 08/07/2015  . Varicose veins of lower extremities with complications 16/09/9603  . Malfunction of arteriovenous dialysis fistula ([place])   . CHF (congestive heart failure) ([place]) 06/23/2015  . Lymphedema distichiasis syndrome with kidney disease and diabetes mellitus ([place]) 04/06/2015  . Lymphedema of lower extremity 04/04/2015  . Type II diabetes mellitus with nephropathy ([place]) 04/04/2015  . MGUS (monoclonal gammopathy of unknown significance) 06/12/2014  . Chronic diastolic heart failure ([place]) 10/30/2013  . Special screening for malignant neoplasms, colon 07/09/2013  . Proliferative diabetic retinopathy ([place]) 10/23/2012  . Knee pain 08/21/2012  . Mass of thigh 08/21/2012  . FEVER UNSPECIFIED 05/06/2010  . ORGANIC IMPOTENCE 12/02/2009  . ONYCHOMYCOSIS 07/17/2009  . Lymphedema 05/06/2009  . ESRD on dialysis ([place]) 06/09/2008  . Hyperlipidemia 03/19/2007  . Class 2 severe obesity due to excess calories with serious comorbidity and body mass index (BMI) of 35.0 to 35.9 in adult ([place]) 02/08/2007  . Essential hypertension 02/08/2007  . Atherosclerosis of native arteries of extremity with intermittent claudication ([place]) 02/08/2007   Past Medical History:  Diagnosis Date  . Arthritis   . Asthma    as a child  . Chronic cystitis   . Chronic diastolic heart failure ([place])   . Chronic kidney disease    HD pt, 3 times a week.  . Diabetes mellitus   . Dysrhythmia   . GERD (gastroesophageal reflux disease)   . H/O hiatal hernia    states it's been fixed  . Hyperlipidemia   . Hypertension   . Lymphedema   . Morbid obesity ([place])   .  NEPHROLITHIASIS, HX OF 12/02/2009   Qualifier: Diagnosis of  By: [name], Cat    . PVD (peripheral vascular disease) ([place])   . Renal insufficiency   . UMBILICAL HERNIA 5/40/9811   Qualifier: History of  By: [name] MD, Audelia Acton    . Venous insufficiency     Family History  Problem Relation Age of Onset  . Diabetes Mother   . Heart disease Mother   . Diabetes Brother     Past Surgical History:  Procedure Laterality Date  . ABDOMINAL AORTOGRAM W/LOWER EXTREMITY N/A 05/19/2017   Procedure: Abdominal Aortogram w/Lower Extremity;  Surgeon: [name], MD;  Location: [place] CV LAB;  Service: Cardiovascular;  Laterality: N/A;  .  AMPUTATION     Right and left fifth toes.   . AMPUTATION TOE     emoval of both little toes  . AV FISTULA PLACEMENT Left 03/21/2014   Procedure: ARTERIOVENOUS (AV) FISTULA CREATION with ultrasound;  Surgeon: [name], MD;  Location: [facility];  Service: Vascular;  Laterality: Left;  . COLONOSCOPY    . EYE SURGERY Bilateral    cataract and lens implant  . HERNIA REPAIR    . LIGATION OF COMPETING BRANCHES OF ARTERIOVENOUS FISTULA Left 06/29/2015   Procedure: LIGATION OF LEFT ARM RADIOCEPHALIC ARTERIOVENOUS FISTULA SIDE BRANCHES;  Surgeon: Conrad Skyland, MD;  Location: [facility];  Service: Vascular;  Laterality: Left;  [name] MULTIPLE EXTRACTIONS WITH ALVEOLOPLASTY N/A 04/07/2017   Procedure: Extraction of tooth #'s 1-11, 13, 14,16, 20-23, and 26-28 with alveoloplasty;  Surgeon: Lenn Cal, DDS;  Location: [facility];  Service: Oral Surgery;  Laterality: N/A;  . Popliteal to posterior tibial bypass     2006  . R knee arthoscopic repair of meniscus    . UMBILICAL HERNIA REPAIR     Social History   Occupational History    Employer: DISABLED   Tobacco Use  . Smoking status: Former Smoker    Quit date: 06/06/1979    Years since quitting: 41.5  . Smokeless tobacco: Former Network engineer  . Vaping Use: Never used  Substance and Sexual Activity  . Alcohol use: No     Alcohol/week: 0.0 standard drinks    Comment:   "years ago" , none now  . Drug use: No  . Sexual activity: Never

## 2021-01-01 ENCOUNTER — Encounter (HOSPITAL_COMMUNITY): Payer: Medicare Other

## 2021-01-01 ENCOUNTER — Other Ambulatory Visit (HOSPITAL_COMMUNITY): Payer: Medicare Other

## 2021-01-01 ENCOUNTER — Ambulatory Visit: Payer: Medicare Other

## 2021-01-04 ENCOUNTER — Ambulatory Visit (INDEPENDENT_AMBULATORY_CARE_PROVIDER_SITE_OTHER): Payer: Medicare Other | Admitting: Orthopedic Surgery

## 2021-01-04 ENCOUNTER — Encounter: Payer: Self-pay | Admitting: Orthopedic Surgery

## 2021-01-04 DIAGNOSIS — L97929 Non-pressure chronic ulcer of unspecified part of left lower leg with unspecified severity: Secondary | ICD-10-CM

## 2021-01-04 DIAGNOSIS — I87333 Chronic venous hypertension (idiopathic) with ulcer and inflammation of bilateral lower extremity: Secondary | ICD-10-CM | POA: Diagnosis not present

## 2021-01-04 DIAGNOSIS — E11621 Type 2 diabetes mellitus with foot ulcer: Secondary | ICD-10-CM | POA: Diagnosis not present

## 2021-01-04 DIAGNOSIS — L97919 Non-pressure chronic ulcer of unspecified part of right lower leg with unspecified severity: Secondary | ICD-10-CM | POA: Diagnosis not present

## 2021-01-04 DIAGNOSIS — L97529 Non-pressure chronic ulcer of other part of left foot with unspecified severity: Secondary | ICD-10-CM | POA: Diagnosis not present

## 2021-01-04 NOTE — Progress Notes (Signed)
Office Visit Note   Patient: [name]           Date of Birth: 1951/09/18           MRN: [id number] Visit Date: 01/04/2021              Requested by: [name], [name] [place],  [place] [id number] PCP: [name], [name]  Chief Complaint  Patient presents with  . Right Foot - Follow-up  . Left Foot - Follow-up      HPI: Patient is a 70 year old gentleman who presents in follow-up for vascular insufficiency and venous insufficiency to both legs he currently has compression wraps on both legs and dressings for the toes.  Assessment & Plan: Visit Diagnoses:  1. Ulcer of left great toe due to diabetes mellitus ([place])   2. Chronic venous hypertension (idiopathic) with ulcer and inflammation of bilateral lower extremity (HCC)     Plan: Patient will follow up with vascular vein surgery this week we will follow-up next week to change the compression wraps.  Continue with dressing changes to the toes.  Follow-Up Instructions: Return in about 2 weeks (around 01/18/2021).   Ortho Exam  Patient is alert, oriented, no adenopathy, well-dressed, normal affect, normal respiratory effort. Examination the ischemic ulcer to the left great toe the callus was removed and patient has excellent granulation tissue and new epithelialization around the ischemic ulcer edges.  There is no ascending cellulitis.  The right great toe has granulation tissue with weeping edema no cellulitis no exposed bone or tendon.  Imaging: No results found. No images are attached to the encounter.  Labs: Lab Results  Component Value Date   HGBA1C 6.2 (H) 07/11/2018   HGBA1C 6.8 03/13/2017   HGBA1C 5.7 12/30/2015   ESRSEDRATE 70 (H) 04/04/2015   ESRSEDRATE 81 (H) 07/14/2011   ESRSEDRATE 117 (H) 06/28/2011   CRP 6.1 (H) 04/04/2015   CRP 0.9 (H) 07/14/2011   CRP 4.6 (H) 06/28/2011   LABURIC 9.7 (H) 02/23/2015   REPTSTATUS 09/15/2018 FINAL 09/10/2018   GRAMSTAIN  06/24/2011    RARE WBC  PRESENT,BOTH PMN AND MONONUCLEAR NO ORGANISMS SEEN   GRAMSTAIN  06/24/2011    RARE WBC PRESENT,BOTH PMN AND MONONUCLEAR NO ORGANISMS SEEN   CULT  09/10/2018    NO GROWTH 5 DAYS Performed at [facility], [place] [address], [place], [place] [address]    LABORGA [facility] MORGANII 08/21/2015     Lab Results  Component Value Date   ALBUMIN 2.7 (L) 09/12/2018   ALBUMIN 3.1 (L) 09/11/2018   ALBUMIN 3.1 (L) 07/10/2018   LABURIC 9.7 (H) 02/23/2015    Lab Results  Component Value Date   MG 2.0 04/13/2017   MG 1.9 03/22/2017   MG 1.7 03/04/2014   No results found for: VD25OH  No results found for: PREALBUMIN CBC EXTENDED Latest Ref Rng & Units 11/11/2020 09/12/2018 09/11/2018  WBC 4.0 - 10.5 K/uL 8.1 7.2 8.1  RBC 4.22 - 5.81 MIL/uL 3.68(L) 3.25(L) 3.46(L)  HGB 13.0 - 17.0 g/dL 10.9(L) 9.9(L) 10.5(L)  HCT 39.0 - 52.0 % 33.8(L) 30.0(L) 32.8(L)  PLT 150 - 400 K/uL 151 92(L) 82(L)  NEUTROABS 1.7 - 7.7 K/uL 5.7 - -  LYMPHSABS 0.7 - 4.0 K/uL 1.6 - -     There is no height or weight on file to calculate BMI.  Orders:  No orders of the defined types were placed in this encounter.  No orders  of the defined types were placed in this encounter.    Procedures: No procedures performed  Clinical Data: No additional findings.  ROS:  All other systems negative, except as noted in the HPI. Review of Systems  Objective: Vital Signs: There were no vitals taken for this visit.  Specialty Comments:  No specialty comments available.  PMFS History: Patient Active Problem List   Diagnosis Date Noted  . Venous stasis   . History of endocarditis   . Bradycardia   . Orthostasis   . Chest pain 09/10/2018  . Hypovolemia associated with hemodialysis 07/11/2018  . Weakness   . Thrombocytopenia ([place]) 07/10/2018  . Paroxysmal atrial fibrillation ([place]) 05/26/2017  . Balanitis 04/25/2017  . Penile swelling   . Scrotal swelling   . UTI (urinary tract infection) 04/13/2017  .  Anemia of chronic disease   . Acute encephalopathy 04/12/2017  . Urinary retention 04/10/2017  . Bacteremia   . RUQ abdominal pain   . Gross hematuria   . Other pancytopenia ([place])   . Sepsis ([place]) 03/21/2017  . Diarrhea 03/03/2017  . Retained lens material following cataract surgery of both eyes 11/08/2016  . Venous stasis ulcers of both lower extremities ([place]) 08/07/2015  . Varicose veins of lower extremities with complications 83/38/2505  . Malfunction of arteriovenous dialysis fistula ([place])   . CHF (congestive heart failure) ([place]) 06/23/2015  . Lymphedema distichiasis syndrome with kidney disease and diabetes mellitus ([place]) 04/06/2015  . Lymphedema of lower extremity 04/04/2015  . Type II diabetes mellitus with nephropathy ([place]) 04/04/2015  . MGUS (monoclonal gammopathy of unknown significance) 06/12/2014  . Chronic diastolic heart failure ([place]) 10/30/2013  . Special screening for malignant neoplasms, colon 07/09/2013  . Proliferative diabetic retinopathy ([place]) 10/23/2012  . Knee pain 08/21/2012  . Mass of thigh 08/21/2012  . FEVER UNSPECIFIED 05/06/2010  . ORGANIC IMPOTENCE 12/02/2009  . ONYCHOMYCOSIS 07/17/2009  . Lymphedema 05/06/2009  . ESRD on dialysis ([place]) 06/09/2008  . Hyperlipidemia 03/19/2007  . Class 2 severe obesity due to excess calories with serious comorbidity and body mass index (BMI) of 35.0 to 35.9 in adult ([place]) 02/08/2007  . Essential hypertension 02/08/2007  . Atherosclerosis of native arteries of extremity with intermittent claudication ([place]) 02/08/2007   Past Medical History:  Diagnosis Date  . Arthritis   . Asthma    as a child  . Chronic cystitis   . Chronic diastolic heart failure ([place])   . Chronic kidney disease    HD pt, 3 times a week.  . Diabetes mellitus   . Dysrhythmia   . GERD (gastroesophageal reflux disease)   . H/O hiatal hernia    states it's been fixed  . Hyperlipidemia   . Hypertension   . Lymphedema   . Morbid obesity ([place])    . NEPHROLITHIASIS, HX OF 12/02/2009   Qualifier: Diagnosis of  By: [name], Cat    . PVD (peripheral vascular disease) ([place])   . Renal insufficiency   . UMBILICAL HERNIA 3/97/6734   Qualifier: History of  By: [name] MD, Audelia Acton    . Venous insufficiency     Family History  Problem Relation Age of Onset  . Diabetes Mother   . Heart disease Mother   . Diabetes Brother     Past Surgical History:  Procedure Laterality Date  . ABDOMINAL AORTOGRAM W/LOWER EXTREMITY N/A 05/19/2017   Procedure: Abdominal Aortogram w/Lower Extremity;  Surgeon: [name], MD;  Location: [facility] CV LAB;  Service: Cardiovascular;  Laterality:  N/A;  . AMPUTATION     Right and left fifth toes.   . AMPUTATION TOE     emoval of both little toes  . AV FISTULA PLACEMENT Left 03/21/2014   Procedure: ARTERIOVENOUS (AV) FISTULA CREATION with ultrasound;  Surgeon: [name], MD;  Location: [facility];  Service: Vascular;  Laterality: Left;  . COLONOSCOPY    . EYE SURGERY Bilateral    cataract and lens implant  . HERNIA REPAIR    . LIGATION OF COMPETING BRANCHES OF ARTERIOVENOUS FISTULA Left 06/29/2015   Procedure: LIGATION OF LEFT ARM RADIOCEPHALIC ARTERIOVENOUS FISTULA SIDE BRANCHES;  Surgeon: Conrad [place] , MD;  Location: [facility];  Service: Vascular;  Laterality: Left;  [name] MULTIPLE EXTRACTIONS WITH ALVEOLOPLASTY N/A 04/07/2017   Procedure: Extraction of tooth #'s 1-11, 13, 14,16, 20-23, and 26-28 with alveoloplasty;  Surgeon: Lenn Cal, DDS;  Location: [facility];  Service: Oral Surgery;  Laterality: N/A;  . Popliteal to posterior tibial bypass     2006  . R knee arthoscopic repair of meniscus    . UMBILICAL HERNIA REPAIR     Social History   Occupational History    Employer: DISABLED   Tobacco Use  . Smoking status: Former Smoker    Quit date: 06/06/1979    Years since quitting: 41.6  . Smokeless tobacco: Former Network engineer  . Vaping Use: Never used  Substance and Sexual Activity  . Alcohol use:  No    Alcohol/week: 0.0 standard drinks    Comment:   "years ago" , none now  . Drug use: No  . Sexual activity: Never

## 2021-01-13 ENCOUNTER — Other Ambulatory Visit: Payer: Self-pay

## 2021-01-13 ENCOUNTER — Ambulatory Visit (INDEPENDENT_AMBULATORY_CARE_PROVIDER_SITE_OTHER): Payer: Medicare Other | Admitting: Physician Assistant

## 2021-01-13 ENCOUNTER — Ambulatory Visit (INDEPENDENT_AMBULATORY_CARE_PROVIDER_SITE_OTHER)
Admission: RE | Admit: 2021-01-13 | Discharge: 2021-01-13 | Disposition: A | Discharge status: Home / Self Care | Payer: Medicare Other | Source: Ambulatory Visit | Attending: Vascular Surgery | Admitting: Vascular Surgery

## 2021-01-13 ENCOUNTER — Ambulatory Visit (HOSPITAL_COMMUNITY)
Admission: RE | Admit: 2021-01-13 | Discharge: 2021-01-13 | Disposition: A | Discharge status: Home / Self Care | Payer: Medicare Other | Source: Ambulatory Visit | Attending: Physician Assistant | Admitting: Physician Assistant

## 2021-01-13 VITALS — BP 184/73 | HR 61 | Temp 97.7°F | Resp 18 | Ht 71.0 in | Wt 220.0 lb

## 2021-01-13 DIAGNOSIS — L97919 Non-pressure chronic ulcer of unspecified part of right lower leg with unspecified severity: Secondary | ICD-10-CM | POA: Diagnosis present

## 2021-01-13 DIAGNOSIS — I739 Peripheral vascular disease, unspecified: Secondary | ICD-10-CM | POA: Diagnosis not present

## 2021-01-13 DIAGNOSIS — I83029 Varicose veins of left lower extremity with ulcer of unspecified site: Secondary | ICD-10-CM | POA: Insufficient documentation

## 2021-01-13 DIAGNOSIS — R030 Elevated blood-pressure reading, without diagnosis of hypertension: Secondary | ICD-10-CM | POA: Diagnosis not present

## 2021-01-13 DIAGNOSIS — L97929 Non-pressure chronic ulcer of unspecified part of left lower leg with unspecified severity: Secondary | ICD-10-CM | POA: Diagnosis present

## 2021-01-13 DIAGNOSIS — I83891 Varicose veins of right lower extremities with other complications: Secondary | ICD-10-CM | POA: Diagnosis not present

## 2021-01-13 DIAGNOSIS — I83019 Varicose veins of right lower extremity with ulcer of unspecified site: Secondary | ICD-10-CM

## 2021-01-13 NOTE — Progress Notes (Signed)
Office Note     CC:  follow up Requesting Provider:  [name], [name]  HPI: [name] is a 70 y.o. (1951-04-20) male who presents with bilateral great toe ulcerations for approximately 4 to 6 weeks.  He states this was secondary to ill fitting shoes he is undergone evaluation by Dr. Sharol Given and had bilateral Unna boots applied.  He is ambulating without assistance in Darco shoes.  The patient states the appearance of the left great toe wound is greatly improved.  He denies claudication or rest pain.  No fever or chills.  He is currently on doxycycline daily.  Patient tells me he has taken aspirin and statin in the past but was told by another provider to discontinue these.  His vascular history is complex and includes peripheral arterial disease status post right popliteal to posterior tibial bypass in 2006 with for nonhealing wounds , end stage renal disease on chronic intermittent hemodialysis with creation of left radiocephalic arteriovenous fistula, history of mixed lymphedema and chronic venous insufficiency.  He was last seen in our office by Dr. Oneida Alar on May 04, 2017.  His arterial duplex study revealed possible vein graft stenosis distally and he was found to have a 40% drop in his ABI.  He underwent abdominal aortogram with bilateral lower extremity runoff that revealed no vein graft stenosis.  His bypass was widely patent.  On the left, he had occlusion of the proximal portion of all 3 tibial vessels with reconstitution of the peroneal which was a single runoff vessel to the left foot.  On the right, widely patent graft with one-vessel runoff via posterior tibial artery to the foot.  Recommendations were follow-up in 1 year with arterial graft duplex scan as well as ABIs.  There is history of type II by diabetes mellitus with nephropathy.  He is on no medications for this. Essential hypertension maintained on calcium channel blocker, beta-blocker and diuretic. He denies use of  statin or aspirin. Quit cigarette smoking in 1980.  Past Medical History:  Diagnosis Date  . Arthritis   . Asthma    as a child  . Chronic cystitis   . Chronic diastolic heart failure ([place])   . Chronic kidney disease    HD pt, 3 times a week.  . Diabetes mellitus   . Dysrhythmia   . GERD (gastroesophageal reflux disease)   . H/O hiatal hernia    states it's been fixed  . Hyperlipidemia   . Hypertension   . Lymphedema   . Morbid obesity ([place])   . NEPHROLITHIASIS, HX OF 12/02/2009   Qualifier: Diagnosis of  By: [name], Cat    . PVD (peripheral vascular disease) ([place])   . Renal insufficiency   . UMBILICAL HERNIA 6/56/8127   Qualifier: History of  By: [name] MD, Audelia Acton    . Venous insufficiency     Past Surgical History:  Procedure Laterality Date  . ABDOMINAL AORTOGRAM W/LOWER EXTREMITY N/A 05/19/2017   Procedure: Abdominal Aortogram w/Lower Extremity;  Surgeon: [name], MD;  Location: [facility];  Service: Cardiovascular;  Laterality: N/A;  . AMPUTATION     Right and left fifth toes.   . AMPUTATION TOE     emoval of both little toes  . AV FISTULA PLACEMENT Left 03/21/2014   Procedure: ARTERIOVENOUS (AV) FISTULA CREATION with ultrasound;  Surgeon: [name], MD;  Location: [facility];  Service: Vascular;  Laterality: Left;  . COLONOSCOPY    .  EYE SURGERY Bilateral    cataract and lens implant  . HERNIA REPAIR    . LIGATION OF COMPETING BRANCHES OF ARTERIOVENOUS FISTULA Left 06/29/2015   Procedure: LIGATION OF LEFT ARM RADIOCEPHALIC ARTERIOVENOUS FISTULA SIDE BRANCHES;  Surgeon: Conrad Sherrill, MD;  Location: [facility];  Service: Vascular;  Laterality: Left;  [name] MULTIPLE EXTRACTIONS WITH ALVEOLOPLASTY N/A 04/07/2017   Procedure: Extraction of tooth #'s 1-11, 13, 14,16, 20-23, and 26-28 with alveoloplasty;  Surgeon: Lenn Cal, DDS;  Location: [facility];  Service: Oral Surgery;  Laterality: N/A;  . Popliteal to posterior tibial bypass     2006  . R knee arthoscopic  repair of meniscus    . UMBILICAL HERNIA REPAIR      Social History   Socioeconomic History  . Marital status: Legally Separated    Spouse name: Not on file  . Number of children: 1  . Years of education: Not on file  . Highest education level: Not on file  Occupational History    Employer: DISABLED   Tobacco Use  . Smoking status: Former Smoker    Quit date: 06/06/1979    Years since quitting: 41.6  . Smokeless tobacco: Former Network engineer  . Vaping Use: Never used  Substance and Sexual Activity  . Alcohol use: No    Alcohol/week: 0.0 standard drinks    Comment:   "years ago" , none now  . Drug use: No  . Sexual activity: Never  Other Topics Concern  . Not on file  Social History Narrative   Lives alone.  Only son is deceased.    Social Determinants of Health   Financial Resource Strain: Not on file  Food Insecurity: Not on file  Transportation Needs: Not on file  Physical Activity: Not on file  Stress: Not on file  Social Connections: Not on file  Intimate Partner Violence: Not on file   Family History  Problem Relation Age of Onset  . Diabetes Mother   . Heart disease Mother   . Diabetes Brother     Current Outpatient Medications  Medication Sig Dispense Refill  . acetaminophen (TYLENOL) 500 MG tablet Take 1,000 mg by mouth daily as needed for moderate pain or headache.    [name] amLODipine (NORVASC) 10 MG tablet Take 10 mg by mouth at bedtime.     . carvedilol (COREG) 6.25 MG tablet Take 6.25 mg by mouth.    . cinacalcet (SENSIPAR) 30 MG tablet Take 2 tablets (60 mg total) by mouth every Tuesday, Thursday, and Saturday at 6 PM. 60 tablet 0  . doxycycline (VIBRA-TABS) 100 MG tablet Take 1 tablet (100 mg total) by mouth 2 (two) times daily. 60 tablet 0  . furosemide (LASIX) 80 MG tablet Take 1 tablet (80 mg total) by mouth 2 (two) times daily. 60 tablet 2   No current facility-administered medications for this visit.    Allergies  Allergen Reactions  .  Shrimp [Shellfish Allergy] Swelling     REVIEW OF SYSTEMS:   [X]  denotes positive finding, [ ]  denotes negative finding Cardiac  Comments:  Chest pain or chest pressure:    Shortness of breath upon exertion:    Short of breath when lying flat:    Irregular heart rhythm:        Vascular    Pain in calf, thigh, or hip brought on by ambulation:    Pain in feet at night that wakes you up from your sleep:  Blood clot in your veins:    Leg swelling:         Pulmonary    Oxygen at home:    Productive cough:     Wheezing:         Neurologic    Sudden weakness in arms or legs:     Sudden numbness in arms or legs:     Sudden onset of difficulty speaking or slurred speech:    Temporary loss of vision in one eye:     Problems with dizziness:         Gastrointestinal    Blood in stool:     Vomited blood:         Genitourinary    Burning when urinating:     Blood in urine:        Psychiatric    Major depression:         Hematologic    Bleeding problems:    Problems with blood clotting too easily:        Skin    Rashes or ulcers:        Constitutional    Fever or chills:      PHYSICAL EXAMINATION: Vitals:   01/13/21 1152 01/13/21 1157  BP: (!) 189/74 (!) 184/73  Pulse: 65 61  Resp: 18   Temp: 97.7 F (36.5 C)   SpO2: 99%     General:  WDWN in NAD; vital signs documented above Gait: Unaided, no ataxia HENT: WNL, normocephalic Pulmonary: normal non-labored breathing  Cardiac: regular HR, Vascular Exam/Pulses: I am able to obtain a biphasic pulse in the right bypass graft at the level of the medial knee. Extremities: The patient's lower extremities are wrapped in compressive Unna boot dressings from the base of the toes to below the knees.  He has significant lipodermatosclerosis of both forefeet.  The left great toe has an approximately 2 x 3 cm ulcer with adherent eschar and small area of granulation tissue.  The right great toe ulcer is approximately 1x2 cm  and also has apparent granulation tissue. Musculoskeletal: no muscle wasting or atrophy  Neurologic: A&O X 3;  No focal weakness or paresthesias are detected Psychiatric:  The pt has Normal affect.  left   left    right    Non-Invasive Vascular Imaging:   01/13/2021 ABI/TBIToday's ABIToday's TBIPrevious ABIPrevious TBI  +-------+-----------+-----------+------------+------------+  Right 0.72    0.32    0.65    0.61      +-------+-----------+-----------+------------+------------+  Left  0.72    0.25    0.67    0.45      Right toe pressure is 64, monophasic waveform Left toe pressure is 50, monophasic waveform  Arterial duplex: Right: 50-74% stenosis noted in the superficial femoral artery. Findings  suggest distal vein graft stenosis (see previous duplex note).   Left: Limited exam showed no significant arterial stenosis proximal to the  knee.   ASSESSMENT/PLAN:: 70 y.o. male here for evaluation of bilateral great toe wounds.  He has a history of PAD and CVI lost to follow-up.  ABIs are slightly improved as compared to 2018.  At the patient's last evaluation in 2018, his duplex study was similar to today's and he underwent arteriogram without findings of vein graft stenosis.  There is no imaging evidence of significant arterial stenosis on the left proximal to the knee.  I will have the patient follow-up in 1 month with Dr. Oneida Alar.  Recommend continued Unna boot dressings and local wound  care.  In the interim, I will review today's examination and noninvasive studies with Dr. Oneida Alar and follow-up with the patient via phone if he has additional recommendation.   I discussed the patient's elevated blood pressure readings today and encouraged him to continue close blood pressure checks and follow-up with his primary care physician.  I dressed his toe ulcers with dry gauze, Kling and Ace wraps.  Barbie Banner, PA-C [facility] [phone]  Clinic MD:  Trula Slade

## 2021-01-18 ENCOUNTER — Ambulatory Visit: Payer: Medicare Other | Admitting: Physician Assistant

## 2021-01-18 ENCOUNTER — Encounter: Payer: Self-pay | Admitting: Orthopedic Surgery

## 2021-01-18 DIAGNOSIS — I87333 Chronic venous hypertension (idiopathic) with ulcer and inflammation of bilateral lower extremity: Secondary | ICD-10-CM

## 2021-01-18 DIAGNOSIS — L97919 Non-pressure chronic ulcer of unspecified part of right lower leg with unspecified severity: Secondary | ICD-10-CM | POA: Diagnosis not present

## 2021-01-18 DIAGNOSIS — L97929 Non-pressure chronic ulcer of unspecified part of left lower leg with unspecified severity: Secondary | ICD-10-CM

## 2021-01-18 NOTE — Progress Notes (Signed)
Office Visit Note   Patient: [name]           Date of Birth: Apr 22, 1951           MRN: [id number] Visit Date: 01/18/2021              Requested by: [name], [name] [place],  [place] [id number] PCP: [name], [name]  Chief Complaint  Patient presents with  . Right Leg - Follow-up  . Left Leg - Follow-up      HPI: Is here for follow-up with his bilateral venous insufficiency.  He did visit with his vascular surgery.  They did do ABIs and stated they were not much different than have been.  We have been doing compression wraps and he follows up for new compression wraps.  He notes that he does have a follow-up with vein and vascular in 1 month  Assessment & Plan: Visit Diagnoses: No diagnosis found.  Plan: Follow-up in 1 week at which time we could transition him to socks if he can get the swelling down on the right.  We will measure him at that time  Follow-Up Instructions: No follow-ups on file.   Ortho Exam  Patient is alert, oriented, no adenopathy, well-dressed, normal affect, normal respiratory effort. Bilateral lower extremities right the compression wrap had slid down to is quite a bit of swelling and some open skin areas on the proximal calf however no cellulitis no foul odor.  Left side has significant decrease in swelling.  Great toe ulcers have improved.  On the right I did debride some of the callus to healthy skin.  Some of the swelling has receded.  No ascending cellulitis noted on either side  Imaging: No results found. No images are attached to the encounter.  Labs: Lab Results  Component Value Date   HGBA1C 6.2 (H) 07/11/2018   HGBA1C 6.8 03/13/2017   HGBA1C 5.7 12/30/2015   ESRSEDRATE 70 (H) 04/04/2015   ESRSEDRATE 81 (H) 07/14/2011   ESRSEDRATE 117 (H) 06/28/2011   CRP 6.1 (H) 04/04/2015   CRP 0.9 (H) 07/14/2011   CRP 4.6 (H) 06/28/2011   LABURIC 9.7 (H) 02/23/2015   REPTSTATUS 09/15/2018 FINAL 09/10/2018    GRAMSTAIN  06/24/2011    RARE WBC PRESENT,BOTH PMN AND MONONUCLEAR NO ORGANISMS SEEN   GRAMSTAIN  06/24/2011    RARE WBC PRESENT,BOTH PMN AND MONONUCLEAR NO ORGANISMS SEEN   CULT  09/10/2018    NO GROWTH 5 DAYS Performed at [facility], [place] [address], [place], [place] [address]    LABORGA [facility] MORGANII 08/21/2015     Lab Results  Component Value Date   ALBUMIN 2.7 (L) 09/12/2018   ALBUMIN 3.1 (L) 09/11/2018   ALBUMIN 3.1 (L) 07/10/2018   LABURIC 9.7 (H) 02/23/2015    Lab Results  Component Value Date   MG 2.0 04/13/2017   MG 1.9 03/22/2017   MG 1.7 03/04/2014   No results found for: VD25OH  No results found for: PREALBUMIN CBC EXTENDED Latest Ref Rng & Units 11/11/2020 09/12/2018 09/11/2018  WBC 4.0 - 10.5 K/uL 8.1 7.2 8.1  RBC 4.22 - 5.81 MIL/uL 3.68(L) 3.25(L) 3.46(L)  HGB 13.0 - 17.0 g/dL 10.9(L) 9.9(L) 10.5(L)  HCT 39.0 - 52.0 % 33.8(L) 30.0(L) 32.8(L)  PLT 150 - 400 K/uL 151 92(L) 82(L)  NEUTROABS 1.7 - 7.7 K/uL 5.7 - -  LYMPHSABS 0.7 - 4.0 K/uL 1.6 - -  There is no height or weight on file to calculate BMI.  Orders:  No orders of the defined types were placed in this encounter.  No orders of the defined types were placed in this encounter.    Procedures: No procedures performed  Clinical Data: No additional findings.  ROS:  All other systems negative, except as noted in the HPI. Review of Systems  Objective: Vital Signs: There were no vitals taken for this visit.  Specialty Comments:  No specialty comments available.  PMFS History: Patient Active Problem List   Diagnosis Date Noted  . Venous stasis   . History of endocarditis   . Bradycardia   . Orthostasis   . Chest pain 09/10/2018  . Hypovolemia associated with hemodialysis 07/11/2018  . Weakness   . Thrombocytopenia ([place]) 07/10/2018  . Paroxysmal atrial fibrillation ([place]) 05/26/2017  . Balanitis 04/25/2017  . Penile swelling   . Scrotal swelling   . UTI (urinary  tract infection) 04/13/2017  . Anemia of chronic disease   . Acute encephalopathy 04/12/2017  . Urinary retention 04/10/2017  . Bacteremia   . RUQ abdominal pain   . Gross hematuria   . Other pancytopenia ([place])   . Sepsis ([place]) 03/21/2017  . Diarrhea 03/03/2017  . Retained lens material following cataract surgery of both eyes 11/08/2016  . Venous stasis ulcers of both lower extremities ([place]) 08/07/2015  . Varicose veins of lower extremities with complications 34/19/3790  . Malfunction of arteriovenous dialysis fistula ([place])   . CHF (congestive heart failure) ([place]) 06/23/2015  . Lymphedema distichiasis syndrome with kidney disease and diabetes mellitus ([place]) 04/06/2015  . Lymphedema of lower extremity 04/04/2015  . Type II diabetes mellitus with nephropathy ([place]) 04/04/2015  . MGUS (monoclonal gammopathy of unknown significance) 06/12/2014  . Chronic diastolic heart failure ([place]) 10/30/2013  . Special screening for malignant neoplasms, colon 07/09/2013  . Proliferative diabetic retinopathy ([place]) 10/23/2012  . Knee pain 08/21/2012  . Mass of thigh 08/21/2012  . FEVER UNSPECIFIED 05/06/2010  . ORGANIC IMPOTENCE 12/02/2009  . ONYCHOMYCOSIS 07/17/2009  . Lymphedema 05/06/2009  . ESRD on dialysis ([place]) 06/09/2008  . Hyperlipidemia 03/19/2007  . Class 2 severe obesity due to excess calories with serious comorbidity and body mass index (BMI) of 35.0 to 35.9 in adult ([place]) 02/08/2007  . Essential hypertension 02/08/2007  . Atherosclerosis of native arteries of extremity with intermittent claudication ([place]) 02/08/2007   Past Medical History:  Diagnosis Date  . Arthritis   . Asthma    as a child  . Chronic cystitis   . Chronic diastolic heart failure ([place])   . Chronic kidney disease    HD pt, 3 times a week.  . Diabetes mellitus   . Dysrhythmia   . GERD (gastroesophageal reflux disease)   . H/O hiatal hernia    states it's been fixed  . Hyperlipidemia   . Hypertension   .  Lymphedema   . Morbid obesity ([place])   . NEPHROLITHIASIS, HX OF 12/02/2009   Qualifier: Diagnosis of  By: [name], Cat    . PVD (peripheral vascular disease) ([place])   . Renal insufficiency   . UMBILICAL HERNIA 2/40/9735   Qualifier: History of  By: [name] MD, Audelia Acton    . Venous insufficiency     Family History  Problem Relation Age of Onset  . Diabetes Mother   . Heart disease Mother   . Diabetes Brother     Past Surgical History:  Procedure Laterality Date  . ABDOMINAL AORTOGRAM  W/LOWER EXTREMITY N/A 05/19/2017   Procedure: Abdominal Aortogram w/Lower Extremity;  Surgeon: [name], MD;  Location: [facility];  Service: Cardiovascular;  Laterality: N/A;  . AMPUTATION     Right and left fifth toes.   . AMPUTATION TOE     emoval of both little toes  . AV FISTULA PLACEMENT Left 03/21/2014   Procedure: ARTERIOVENOUS (AV) FISTULA CREATION with ultrasound;  Surgeon: [name], MD;  Location: [facility];  Service: Vascular;  Laterality: Left;  . COLONOSCOPY    . EYE SURGERY Bilateral    cataract and lens implant  . HERNIA REPAIR    . LIGATION OF COMPETING BRANCHES OF ARTERIOVENOUS FISTULA Left 06/29/2015   Procedure: LIGATION OF LEFT ARM RADIOCEPHALIC ARTERIOVENOUS FISTULA SIDE BRANCHES;  Surgeon: Conrad Amana, MD;  Location: [facility];  Service: Vascular;  Laterality: Left;  [name] MULTIPLE EXTRACTIONS WITH ALVEOLOPLASTY N/A 04/07/2017   Procedure: Extraction of tooth #'s 1-11, 13, 14,16, 20-23, and 26-28 with alveoloplasty;  Surgeon: Lenn Cal, DDS;  Location: [facility];  Service: Oral Surgery;  Laterality: N/A;  . Popliteal to posterior tibial bypass     2006  . R knee arthoscopic repair of meniscus    . UMBILICAL HERNIA REPAIR     Social History   Occupational History    Employer: DISABLED   Tobacco Use  . Smoking status: Former Smoker    Quit date: 06/06/1979    Years since quitting: 41.6  . Smokeless tobacco: Former Network engineer  . Vaping Use: Never used  Substance  and Sexual Activity  . Alcohol use: No    Alcohol/week: 0.0 standard drinks    Comment:   "years ago" , none now  . Drug use: No  . Sexual activity: Never

## 2021-01-25 ENCOUNTER — Encounter: Payer: Self-pay | Admitting: Physician Assistant

## 2021-01-25 ENCOUNTER — Ambulatory Visit (INDEPENDENT_AMBULATORY_CARE_PROVIDER_SITE_OTHER): Payer: Medicare Other | Admitting: Physician Assistant

## 2021-01-25 DIAGNOSIS — I878 Other specified disorders of veins: Secondary | ICD-10-CM

## 2021-01-25 NOTE — Progress Notes (Signed)
Office Visit Note   Patient: [name]           Date of Birth: [birth year]           MRN: [id number] Visit Date: 01/25/2021              Requested by: [name], [name] [place],  [place] [address] PCP: [name], [name]  Chief Complaint  Patient presents with  . Left Leg - Follow-up  . Right Leg - Follow-up      HPI: Patient is status post bilateral lower extremity venous insufficiency.  He is here to transition into compression socks.  He has been wearing Profore wraps.  Also bilateral great toe wounds which are improving  Assessment & Plan: Visit Diagnoses: No diagnosis found.  Plan: Compression socks were placed.  He is going to buy more pairs of these and change them out daily.  Follow-up in 2 weeks.  I told him he could think about transitioning to a shoe with a toe box that would not place pressure on his great toes.  Follow-Up Instructions: No follow-ups on file.   Ortho Exam  Patient is alert, oriented, no adenopathy, well-dressed, normal affect, normal respiratory effort. Bilateral lower extremities with venous insufficiency.  No cellulitis no open ulcers to great toe ulcers are scabbed over without any foul odor or drainage or ascending cellulitis.  I help the patient put on his compression socks.  We will follow-up in 2 weeks.  Should come in sooner if there is any concerns  Imaging: No results found. No images are attached to the encounter.  Labs: Lab Results  Component Value Date   HGBA1C 6.2 (H) 07/11/2018   HGBA1C 6.8 03/13/2017   HGBA1C 5.7 12/30/2015   ESRSEDRATE 70 (H) 04/04/2015   ESRSEDRATE 81 (H) 07/14/2011   ESRSEDRATE 117 (H) 06/28/2011   CRP 6.1 (H) 04/04/2015   CRP 0.9 (H) 07/14/2011   CRP 4.6 (H) 06/28/2011   LABURIC 9.7 (H) 02/23/2015   REPTSTATUS 09/15/2018 FINAL 09/10/2018   GRAMSTAIN  06/24/2011    RARE WBC PRESENT,BOTH PMN AND MONONUCLEAR NO ORGANISMS SEEN   GRAMSTAIN  06/24/2011    RARE WBC  PRESENT,BOTH PMN AND MONONUCLEAR NO ORGANISMS SEEN   CULT  09/10/2018    NO GROWTH 5 DAYS Performed at [facility], [place] [address] [place], [place] [address]    LABORGA [facility] MORGANII 08/21/2015     Lab Results  Component Value Date   ALBUMIN 2.7 (L) 09/12/2018   ALBUMIN 3.1 (L) 09/11/2018   ALBUMIN 3.1 (L) 07/10/2018   LABURIC 9.7 (H) 02/23/2015    Lab Results  Component Value Date   MG 2.0 04/13/2017   MG 1.9 03/22/2017   MG 1.7 03/04/2014   No results found for: VD25OH  No results found for: PREALBUMIN CBC EXTENDED Latest Ref Rng & Units 11/11/2020 09/12/2018 09/11/2018  WBC 4.0 - 10.5 K/uL 8.1 7.2 8.1  RBC 4.22 - 5.81 MIL/uL 3.68(L) 3.25(L) 3.46(L)  HGB 13.0 - 17.0 g/dL 10.9(L) 9.9(L) 10.5(L)  HCT 39.0 - 52.0 % 33.8(L) 30.0(L) 32.8(L)  PLT 150 - 400 K/uL 151 92(L) 82(L)  NEUTROABS 1.7 - 7.7 K/uL 5.7 - -  LYMPHSABS 0.7 - 4.0 K/uL 1.6 - -     There is no height or weight on file to calculate BMI.  Orders:  No orders of the defined types were placed in this encounter.  No orders of the defined  types were placed in this encounter.    Procedures: No procedures performed  Clinical Data: No additional findings.  ROS:  All other systems negative, except as noted in the HPI. Review of Systems  Objective: Vital Signs: There were no vitals taken for this visit.  Specialty Comments:  No specialty comments available.  PMFS History: Patient Active Problem List   Diagnosis Date Noted  . Venous stasis   . History of endocarditis   . Bradycardia   . Orthostasis   . Chest pain 09/10/2018  . Hypovolemia associated with hemodialysis 07/11/2018  . Weakness   . Thrombocytopenia ([place]) 07/10/2018  . Paroxysmal atrial fibrillation ([place]) 05/26/2017  . Balanitis 04/25/2017  . Penile swelling   . Scrotal swelling   . UTI (urinary tract infection) 04/13/2017  . Anemia of chronic disease   . Acute encephalopathy 04/12/2017  . Urinary retention  04/10/2017  . Bacteremia   . RUQ abdominal pain   . Gross hematuria   . Other pancytopenia ([place])   . Sepsis ([place]) 03/21/2017  . Diarrhea 03/03/2017  . Retained lens material following cataract surgery of both eyes 11/08/2016  . Venous stasis ulcers of both lower extremities ([place]) 08/07/2015  . Varicose veins of lower extremities with complications 66/05/3015  . Malfunction of arteriovenous dialysis fistula ([place])   . CHF (congestive heart failure) ([place]) 06/23/2015  . Lymphedema distichiasis syndrome with kidney disease and diabetes mellitus ([place]) 04/06/2015  . Lymphedema of lower extremity 04/04/2015  . Type II diabetes mellitus with nephropathy ([place]) 04/04/2015  . MGUS (monoclonal gammopathy of unknown significance) 06/12/2014  . Chronic diastolic heart failure ([place]) 10/30/2013  . Special screening for malignant neoplasms, colon 07/09/2013  . Proliferative diabetic retinopathy ([place]) 10/23/2012  . Knee pain 08/21/2012  . Mass of thigh 08/21/2012  . FEVER UNSPECIFIED 05/06/2010  . ORGANIC IMPOTENCE 12/02/2009  . ONYCHOMYCOSIS 07/17/2009  . Lymphedema 05/06/2009  . ESRD on dialysis ([place]) 06/09/2008  . Hyperlipidemia 03/19/2007  . Class 2 severe obesity due to excess calories with serious comorbidity and body mass index (BMI) of 35.0 to 35.9 in adult ([place]) 02/08/2007  . Essential hypertension 02/08/2007  . Atherosclerosis of native arteries of extremity with intermittent claudication ([place]) 02/08/2007   Past Medical History:  Diagnosis Date  . Arthritis   . Asthma    as a child  . Chronic cystitis   . Chronic diastolic heart failure ([place])   . Chronic kidney disease    HD pt, 3 times a week.  . Diabetes mellitus   . Dysrhythmia   . GERD (gastroesophageal reflux disease)   . H/O hiatal hernia    states it's been fixed  . Hyperlipidemia   . Hypertension   . Lymphedema   . Morbid obesity ([place])   . NEPHROLITHIASIS, HX OF 12/02/2009   Qualifier: Diagnosis of  By: [name], Cat     . PVD (peripheral vascular disease) ([place])   . Renal insufficiency   . UMBILICAL HERNIA 0/09/9322   Qualifier: History of  By: [name] MD, Audelia Acton    . Venous insufficiency     Family History  Problem Relation Age of Onset  . Diabetes Mother   . Heart disease Mother   . Diabetes Brother     Past Surgical History:  Procedure Laterality Date  . ABDOMINAL AORTOGRAM W/LOWER EXTREMITY N/A 05/19/2017   Procedure: Abdominal Aortogram w/Lower Extremity;  Surgeon: [name], MD;  Location: [facility];  Service: Cardiovascular;  Laterality: N/A;  .  AMPUTATION     Right and left fifth toes.   . AMPUTATION TOE     emoval of both little toes  . AV FISTULA PLACEMENT Left 03/21/2014   Procedure: ARTERIOVENOUS (AV) FISTULA CREATION with ultrasound;  Surgeon: [name], MD;  Location: [facility];  Service: Vascular;  Laterality: Left;  . COLONOSCOPY    . EYE SURGERY Bilateral    cataract and lens implant  . HERNIA REPAIR    . LIGATION OF COMPETING BRANCHES OF ARTERIOVENOUS FISTULA Left 06/29/2015   Procedure: LIGATION OF LEFT ARM RADIOCEPHALIC ARTERIOVENOUS FISTULA SIDE BRANCHES;  Surgeon: Conrad Fenton, MD;  Location: [facility];  Service: Vascular;  Laterality: Left;  [name] MULTIPLE EXTRACTIONS WITH ALVEOLOPLASTY N/A 04/07/2017   Procedure: Extraction of tooth #'s 1-11, 13, 14,16, 20-23, and 26-28 with alveoloplasty;  Surgeon: Lenn Cal, DDS;  Location: [facility];  Service: Oral Surgery;  Laterality: N/A;  . Popliteal to posterior tibial bypass     2006  . R knee arthoscopic repair of meniscus    . UMBILICAL HERNIA REPAIR     Social History   Occupational History    Employer: DISABLED   Tobacco Use  . Smoking status: Former Smoker    Quit date: 06/06/1979    Years since quitting: 41.6  . Smokeless tobacco: Former Network engineer  . Vaping Use: Never used  Substance and Sexual Activity  . Alcohol use: No    Alcohol/week: 0.0 standard drinks    Comment:   "years ago" , none now  .  Drug use: No  . Sexual activity: Never

## 2021-02-08 ENCOUNTER — Encounter: Payer: Self-pay | Admitting: Orthopedic Surgery

## 2021-02-08 ENCOUNTER — Ambulatory Visit (INDEPENDENT_AMBULATORY_CARE_PROVIDER_SITE_OTHER): Payer: Medicare Other | Admitting: Orthopedic Surgery

## 2021-02-08 DIAGNOSIS — L97919 Non-pressure chronic ulcer of unspecified part of right lower leg with unspecified severity: Secondary | ICD-10-CM | POA: Diagnosis not present

## 2021-02-08 DIAGNOSIS — I87333 Chronic venous hypertension (idiopathic) with ulcer and inflammation of bilateral lower extremity: Secondary | ICD-10-CM | POA: Diagnosis not present

## 2021-02-08 DIAGNOSIS — E11621 Type 2 diabetes mellitus with foot ulcer: Secondary | ICD-10-CM

## 2021-02-08 DIAGNOSIS — I890 Lymphedema, not elsewhere classified: Secondary | ICD-10-CM | POA: Diagnosis not present

## 2021-02-08 DIAGNOSIS — L97929 Non-pressure chronic ulcer of unspecified part of left lower leg with unspecified severity: Secondary | ICD-10-CM

## 2021-02-08 DIAGNOSIS — L97529 Non-pressure chronic ulcer of other part of left foot with unspecified severity: Secondary | ICD-10-CM

## 2021-02-08 NOTE — Progress Notes (Signed)
Office Visit Note   Patient: [name]           Date of Birth: 1951-01-06           MRN: [id number] Visit Date: 02/08/2021              Requested by: [name], [name] [place],  [place] [address] PCP: [name], [name]  Chief Complaint  Patient presents with  . Right Leg - Follow-up  . Left Leg - Follow-up      HPI: Patient is a 70 year old gentleman with venous and lymphatic insufficiency of both lower extremities who presents with progressive ulceration to the right foot great toe with new ischemic changes to the second toe.  Assessment & Plan: Visit Diagnoses:  1. Chronic venous hypertension (idiopathic) with ulcer and inflammation of bilateral lower extremity (HCC)   2. Lymphedema of both lower extremities   3. Ulcer of left great toe due to diabetes mellitus ([place])     Plan: We will apply a Dynaflex compression wrap to try to improve his microcirculation reevaluate in 1 week  Follow-Up Instructions: Return in about 1 week (around 02/15/2021).   Ortho Exam  Patient is alert, oriented, no adenopathy, well-dressed, normal affect, normal respiratory effort. Examination patient has massive brawny edema of both legs with discoloration and dry flaky skin. He has a new ischemic ulcer to the second toe with progressive ischemic changes to the great toe. Patient's capillary refill in his toes is 1 second. There is a dry ulcer to the left great toe.  Patient is status post a right femoral to popliteal bypass graft recent ankle-brachial indices shows no change in his previous circulation. However patient does have ischemic changes to the toes on the right foot and stable ischemic changes to the great toe on the left foot.  Imaging: No results found. No images are attached to the encounter.  Labs: Lab Results  Component Value Date   HGBA1C 6.2 (H) 07/11/2018   HGBA1C 6.8 03/13/2017   HGBA1C 5.7 12/30/2015   ESRSEDRATE 70 (H) 04/04/2015    ESRSEDRATE 81 (H) 07/14/2011   ESRSEDRATE 117 (H) 06/28/2011   CRP 6.1 (H) 04/04/2015   CRP 0.9 (H) 07/14/2011   CRP 4.6 (H) 06/28/2011   LABURIC 9.7 (H) 02/23/2015   REPTSTATUS 09/15/2018 FINAL 09/10/2018   GRAMSTAIN  06/24/2011    RARE WBC PRESENT,BOTH PMN AND MONONUCLEAR NO ORGANISMS SEEN   GRAMSTAIN  06/24/2011    RARE WBC PRESENT,BOTH PMN AND MONONUCLEAR NO ORGANISMS SEEN   CULT  09/10/2018    NO GROWTH 5 DAYS Performed at [facility], [place] [address], [place], [place] [address]    LABORGA [facility] MORGANII 08/21/2015     Lab Results  Component Value Date   ALBUMIN 2.7 (L) 09/12/2018   ALBUMIN 3.1 (L) 09/11/2018   ALBUMIN 3.1 (L) 07/10/2018   LABURIC 9.7 (H) 02/23/2015    Lab Results  Component Value Date   MG 2.0 04/13/2017   MG 1.9 03/22/2017   MG 1.7 03/04/2014   No results found for: VD25OH  No results found for: PREALBUMIN CBC EXTENDED Latest Ref Rng & Units 11/11/2020 09/12/2018 09/11/2018  WBC 4.0 - 10.5 K/uL 8.1 7.2 8.1  RBC 4.22 - 5.81 MIL/uL 3.68(L) 3.25(L) 3.46(L)  HGB 13.0 - 17.0 g/dL 10.9(L) 9.9(L) 10.5(L)  HCT 39.0 - 52.0 % 33.8(L) 30.0(L) 32.8(L)  PLT 150 - 400 K/uL 151 92(L) 82(L)  NEUTROABS 1.7 -  7.7 K/uL 5.7 - -  LYMPHSABS 0.7 - 4.0 K/uL 1.6 - -     There is no height or weight on file to calculate BMI.  Orders:  No orders of the defined types were placed in this encounter.  No orders of the defined types were placed in this encounter.    Procedures: No procedures performed  Clinical Data: No additional findings.  ROS:  All other systems negative, except as noted in the HPI. Review of Systems  Objective: Vital Signs: There were no vitals taken for this visit.  Specialty Comments:  No specialty comments available.  PMFS History: Patient Active Problem List   Diagnosis Date Noted  . Venous stasis   . History of endocarditis   . Bradycardia   . Orthostasis   . Chest pain 09/10/2018  . Hypovolemia associated  with hemodialysis 07/11/2018  . Weakness   . Thrombocytopenia ([place]) 07/10/2018  . Paroxysmal atrial fibrillation ([place]) 05/26/2017  . Balanitis 04/25/2017  . Penile swelling   . Scrotal swelling   . UTI (urinary tract infection) 04/13/2017  . Anemia of chronic disease   . Acute encephalopathy 04/12/2017  . Urinary retention 04/10/2017  . Bacteremia   . RUQ abdominal pain   . Gross hematuria   . Other pancytopenia ([place])   . Sepsis ([place]) 03/21/2017  . Diarrhea 03/03/2017  . Retained lens material following cataract surgery of both eyes 11/08/2016  . Venous stasis ulcers of both lower extremities ([place]) 08/07/2015  . Varicose veins of lower extremities with complications 61/44/3154  . Malfunction of arteriovenous dialysis fistula ([place])   . CHF (congestive heart failure) ([place]) 06/23/2015  . Lymphedema distichiasis syndrome with kidney disease and diabetes mellitus ([place]) 04/06/2015  . Lymphedema of lower extremity 04/04/2015  . Type II diabetes mellitus with nephropathy ([place]) 04/04/2015  . MGUS (monoclonal gammopathy of unknown significance) 06/12/2014  . Chronic diastolic heart failure ([place]) 10/30/2013  . Special screening for malignant neoplasms, colon 07/09/2013  . Proliferative diabetic retinopathy ([place]) 10/23/2012  . Knee pain 08/21/2012  . Mass of thigh 08/21/2012  . FEVER UNSPECIFIED 05/06/2010  . ORGANIC IMPOTENCE 12/02/2009  . ONYCHOMYCOSIS 07/17/2009  . Lymphedema 05/06/2009  . ESRD on dialysis ([place]) 06/09/2008  . Hyperlipidemia 03/19/2007  . Class 2 severe obesity due to excess calories with serious comorbidity and body mass index (BMI) of 35.0 to 35.9 in adult ([place]) 02/08/2007  . Essential hypertension 02/08/2007  . Atherosclerosis of native arteries of extremity with intermittent claudication ([place]) 02/08/2007   Past Medical History:  Diagnosis Date  . Arthritis   . Asthma    as a child  . Chronic cystitis   . Chronic diastolic heart failure ([place])   . Chronic  kidney disease    HD pt, 3 times a week.  . Diabetes mellitus   . Dysrhythmia   . GERD (gastroesophageal reflux disease)   . H/O hiatal hernia    states it's been fixed  . Hyperlipidemia   . Hypertension   . Lymphedema   . Morbid obesity ([place])   . NEPHROLITHIASIS, HX OF 12/02/2009   Qualifier: Diagnosis of  By: [name], Cat    . PVD (peripheral vascular disease) ([place])   . Renal insufficiency   . UMBILICAL HERNIA 0/07/6760   Qualifier: History of  By: [name] MD, Audelia Acton    . Venous insufficiency     Family History  Problem Relation Age of Onset  . Diabetes Mother   . Heart disease Mother   .  Diabetes Brother     Past Surgical History:  Procedure Laterality Date  . ABDOMINAL AORTOGRAM W/LOWER EXTREMITY N/A 05/19/2017   Procedure: Abdominal Aortogram w/Lower Extremity;  Surgeon: [name], MD;  Location: [facility];  Service: Cardiovascular;  Laterality: N/A;  . AMPUTATION     Right and left fifth toes.   . AMPUTATION TOE     emoval of both little toes  . AV FISTULA PLACEMENT Left 03/21/2014   Procedure: ARTERIOVENOUS (AV) FISTULA CREATION with ultrasound;  Surgeon: [name], MD;  Location: [facility];  Service: Vascular;  Laterality: Left;  . COLONOSCOPY    . EYE SURGERY Bilateral    cataract and lens implant  . HERNIA REPAIR    . LIGATION OF COMPETING BRANCHES OF ARTERIOVENOUS FISTULA Left 06/29/2015   Procedure: LIGATION OF LEFT ARM RADIOCEPHALIC ARTERIOVENOUS FISTULA SIDE BRANCHES;  Surgeon: Conrad [place], MD;  Location: [facility];  Service: Vascular;  Laterality: Left;  [name] MULTIPLE EXTRACTIONS WITH ALVEOLOPLASTY N/A 04/07/2017   Procedure: Extraction of tooth #'s 1-11, 13, 14,16, 20-23, and 26-28 with alveoloplasty;  Surgeon: Lenn Cal, DDS;  Location: [facility];  Service: Oral Surgery;  Laterality: N/A;  . Popliteal to posterior tibial bypass     2006  . R knee arthoscopic repair of meniscus    . UMBILICAL HERNIA REPAIR     Social History   Occupational  History    Employer: DISABLED   Tobacco Use  . Smoking status: Former Smoker    Quit date: 06/06/1979    Years since quitting: 41.7  . Smokeless tobacco: Former Network engineer  . Vaping Use: Never used  Substance and Sexual Activity  . Alcohol use: No    Alcohol/week: 0.0 standard drinks    Comment:   "years ago" , none now  . Drug use: No  . Sexual activity: Never

## 2021-02-10 ENCOUNTER — Ambulatory Visit: Payer: Medicare Other

## 2021-02-11 ENCOUNTER — Ambulatory Visit: Payer: Medicare Other | Admitting: Vascular Surgery

## 2021-02-15 ENCOUNTER — Ambulatory Visit: Payer: Medicare Other | Admitting: Orthopedic Surgery

## 2021-02-15 DIAGNOSIS — I890 Lymphedema, not elsewhere classified: Secondary | ICD-10-CM | POA: Diagnosis not present

## 2021-02-22 ENCOUNTER — Ambulatory Visit (INDEPENDENT_AMBULATORY_CARE_PROVIDER_SITE_OTHER): Payer: Medicare Other | Admitting: Orthopedic Surgery

## 2021-02-22 ENCOUNTER — Other Ambulatory Visit: Payer: Self-pay

## 2021-02-22 ENCOUNTER — Encounter: Payer: Self-pay | Admitting: Orthopedic Surgery

## 2021-02-22 DIAGNOSIS — L97919 Non-pressure chronic ulcer of unspecified part of right lower leg with unspecified severity: Secondary | ICD-10-CM

## 2021-02-22 DIAGNOSIS — I87333 Chronic venous hypertension (idiopathic) with ulcer and inflammation of bilateral lower extremity: Secondary | ICD-10-CM | POA: Diagnosis not present

## 2021-02-22 DIAGNOSIS — I890 Lymphedema, not elsewhere classified: Secondary | ICD-10-CM

## 2021-02-22 DIAGNOSIS — L97529 Non-pressure chronic ulcer of other part of left foot with unspecified severity: Secondary | ICD-10-CM

## 2021-02-22 DIAGNOSIS — E11621 Type 2 diabetes mellitus with foot ulcer: Secondary | ICD-10-CM

## 2021-02-22 DIAGNOSIS — L97929 Non-pressure chronic ulcer of unspecified part of left lower leg with unspecified severity: Secondary | ICD-10-CM

## 2021-02-22 NOTE — Progress Notes (Signed)
Office Visit Note   Patient: [name]           Date of Birth: 1951-03-15           MRN: [id number] Visit Date: 02/22/2021              Requested by: [name], [name] [place],  [place] [address] PCP: [name], [name]  Chief Complaint  Patient presents with  . Left Leg - Follow-up  . Right Leg - Follow-up      HPI: Patient is a 70 year old gentleman who has undergone serial compression wraps for venous and lymphatic insufficiency both feet with ulcers on his legs as well as ulcers on the tip of the great toes bilaterally.  Assessment & Plan: Visit Diagnoses:  1. Lymphedema of both lower extremities   2. Chronic venous hypertension (idiopathic) with ulcer and inflammation of bilateral lower extremity (HCC)   3. Ulcer of left great toe due to diabetes mellitus ([place])     Plan: Patient will be placed on an extra-large compression sock today he will wear this around-the-clock change daily wash with soap and water and use lotion on the legs.  Follow-Up Instructions: Return in about 1 week (around 03/01/2021).   Ortho Exam  Patient is alert, oriented, no adenopathy, well-dressed, normal affect, normal respiratory effort. Examination patient has significant decreased edema in both legs there is dry flaky skin on both legs but there are no ulcers no drainage no cellulitis.  The ulcers on the great toe are also healing well bilaterally.  After informed consent a 10 blade knife was used to debride the skin and soft tissue back to healthy viable granulation tissue on the right and patient had superficial epithelialization on the left.  The wound beds were touched with silver nitrate for hemostasis.  The wounds were 2 cm in diameter 1 mm deep.  There is no exposed bone or tendon no signs of infection.  Right calf is 40 cm in circumference left calf is 35.  Imaging: No results found. No images are attached to the encounter.  Labs: Lab Results  Component  Value Date   HGBA1C 6.2 (H) 07/11/2018   HGBA1C 6.8 03/13/2017   HGBA1C 5.7 12/30/2015   ESRSEDRATE 70 (H) 04/04/2015   ESRSEDRATE 81 (H) 07/14/2011   ESRSEDRATE 117 (H) 06/28/2011   CRP 6.1 (H) 04/04/2015   CRP 0.9 (H) 07/14/2011   CRP 4.6 (H) 06/28/2011   LABURIC 9.7 (H) 02/23/2015   REPTSTATUS 09/15/2018 FINAL 09/10/2018   GRAMSTAIN  06/24/2011    RARE WBC PRESENT,BOTH PMN AND MONONUCLEAR NO ORGANISMS SEEN   GRAMSTAIN  06/24/2011    RARE WBC PRESENT,BOTH PMN AND MONONUCLEAR NO ORGANISMS SEEN   CULT  09/10/2018    NO GROWTH 5 DAYS Performed at [facility], [place] [address] [place], [place] [address]    LABORGA [facility] MORGANII 08/21/2015     Lab Results  Component Value Date   ALBUMIN 2.7 (L) 09/12/2018   ALBUMIN 3.1 (L) 09/11/2018   ALBUMIN 3.1 (L) 07/10/2018   LABURIC 9.7 (H) 02/23/2015    Lab Results  Component Value Date   MG 2.0 04/13/2017   MG 1.9 03/22/2017   MG 1.7 03/04/2014   No results found for: VD25OH  No results found for: PREALBUMIN CBC EXTENDED Latest Ref Rng & Units 11/11/2020 09/12/2018 09/11/2018  WBC 4.0 - 10.5 K/uL 8.1 7.2 8.1  RBC 4.22 - 5.81 MIL/uL 3.68(L)  3.25(L) 3.46(L)  HGB 13.0 - 17.0 g/dL 10.9(L) 9.9(L) 10.5(L)  HCT 39.0 - 52.0 % 33.8(L) 30.0(L) 32.8(L)  PLT 150 - 400 K/uL 151 92(L) 82(L)  NEUTROABS 1.7 - 7.7 K/uL 5.7 - -  LYMPHSABS 0.7 - 4.0 K/uL 1.6 - -     There is no height or weight on file to calculate BMI.  Orders:  No orders of the defined types were placed in this encounter.  No orders of the defined types were placed in this encounter.    Procedures: No procedures performed  Clinical Data: No additional findings.  ROS:  All other systems negative, except as noted in the HPI. Review of Systems  Objective: Vital Signs: There were no vitals taken for this visit.  Specialty Comments:  No specialty comments available.  PMFS History: Patient Active Problem List   Diagnosis Date Noted  . Venous  stasis   . History of endocarditis   . Bradycardia   . Orthostasis   . Chest pain 09/10/2018  . Hypovolemia associated with hemodialysis 07/11/2018  . Weakness   . Thrombocytopenia ([place]) 07/10/2018  . Paroxysmal atrial fibrillation ([place]) 05/26/2017  . Balanitis 04/25/2017  . Penile swelling   . Scrotal swelling   . UTI (urinary tract infection) 04/13/2017  . Anemia of chronic disease   . Acute encephalopathy 04/12/2017  . Urinary retention 04/10/2017  . Bacteremia   . RUQ abdominal pain   . Gross hematuria   . Other pancytopenia ([place])   . Sepsis ([place]) 03/21/2017  . Diarrhea 03/03/2017  . Retained lens material following cataract surgery of both eyes 11/08/2016  . Venous stasis ulcers of both lower extremities ([place]) 08/07/2015  . Varicose veins of lower extremities with complications 16/60/6301  . Malfunction of arteriovenous dialysis fistula ([place])   . CHF (congestive heart failure) ([place]) 06/23/2015  . Lymphedema distichiasis syndrome with kidney disease and diabetes mellitus ([place]) 04/06/2015  . Lymphedema of lower extremity 04/04/2015  . Type II diabetes mellitus with nephropathy ([place]) 04/04/2015  . MGUS (monoclonal gammopathy of unknown significance) 06/12/2014  . Chronic diastolic heart failure ([place]) 10/30/2013  . Special screening for malignant neoplasms, colon 07/09/2013  . Proliferative diabetic retinopathy ([place]) 10/23/2012  . Knee pain 08/21/2012  . Mass of thigh 08/21/2012  . FEVER UNSPECIFIED 05/06/2010  . ORGANIC IMPOTENCE 12/02/2009  . ONYCHOMYCOSIS 07/17/2009  . Lymphedema 05/06/2009  . ESRD on dialysis ([place]) 06/09/2008  . Hyperlipidemia 03/19/2007  . Class 2 severe obesity due to excess calories with serious comorbidity and body mass index (BMI) of 35.0 to 35.9 in adult ([place]) 02/08/2007  . Essential hypertension 02/08/2007  . Atherosclerosis of native arteries of extremity with intermittent claudication ([place]) 02/08/2007   Past Medical History:  Diagnosis  Date  . Arthritis   . Asthma    as a child  . Chronic cystitis   . Chronic diastolic heart failure ([place])   . Chronic kidney disease    HD pt, 3 times a week.  . Diabetes mellitus   . Dysrhythmia   . GERD (gastroesophageal reflux disease)   . H/O hiatal hernia    states it's been fixed  . Hyperlipidemia   . Hypertension   . Lymphedema   . Morbid obesity ([place])   . NEPHROLITHIASIS, HX OF 12/02/2009   Qualifier: Diagnosis of  By: [name], Cat    . PVD (peripheral vascular disease) ([place])   . Renal insufficiency   . UMBILICAL HERNIA 05/13/931   Qualifier: History of  By:  Hudnall MD, Audelia Acton    . Venous insufficiency     Family History  Problem Relation Age of Onset  . Diabetes Mother   . Heart disease Mother   . Diabetes Brother     Past Surgical History:  Procedure Laterality Date  . ABDOMINAL AORTOGRAM W/LOWER EXTREMITY N/A 05/19/2017   Procedure: Abdominal Aortogram w/Lower Extremity;  Surgeon: [name], MD;  Location: [facility];  Service: Cardiovascular;  Laterality: N/A;  . AMPUTATION     Right and left fifth toes.   . AMPUTATION TOE     emoval of both little toes  . AV FISTULA PLACEMENT Left 03/21/2014   Procedure: ARTERIOVENOUS (AV) FISTULA CREATION with ultrasound;  Surgeon: [name], MD;  Location: [facility];  Service: Vascular;  Laterality: Left;  . COLONOSCOPY    . EYE SURGERY Bilateral    cataract and lens implant  . HERNIA REPAIR    . LIGATION OF COMPETING BRANCHES OF ARTERIOVENOUS FISTULA Left 06/29/2015   Procedure: LIGATION OF LEFT ARM RADIOCEPHALIC ARTERIOVENOUS FISTULA SIDE BRANCHES;  Surgeon: Conrad Cedar, MD;  Location: [facility];  Service: Vascular;  Laterality: Left;  [name] MULTIPLE EXTRACTIONS WITH ALVEOLOPLASTY N/A 04/07/2017   Procedure: Extraction of tooth #'s 1-11, 13, 14,16, 20-23, and 26-28 with alveoloplasty;  Surgeon: Lenn Cal, DDS;  Location: [facility];  Service: Oral Surgery;  Laterality: N/A;  . Popliteal to posterior tibial bypass      2006  . R knee arthoscopic repair of meniscus    . UMBILICAL HERNIA REPAIR     Social History   Occupational History    Employer: DISABLED   Tobacco Use  . Smoking status: Former Smoker    Quit date: 06/06/1979    Years since quitting: 41.7  . Smokeless tobacco: Former Network engineer  . Vaping Use: Never used  Substance and Sexual Activity  . Alcohol use: No    Alcohol/week: 0.0 standard drinks    Comment:   "years ago" , none now  . Drug use: No  . Sexual activity: Never

## 2021-03-01 ENCOUNTER — Ambulatory Visit (INDEPENDENT_AMBULATORY_CARE_PROVIDER_SITE_OTHER): Payer: Medicare Other | Admitting: Physician Assistant

## 2021-03-01 ENCOUNTER — Encounter: Payer: Self-pay | Admitting: Orthopedic Surgery

## 2021-03-01 DIAGNOSIS — I878 Other specified disorders of veins: Secondary | ICD-10-CM

## 2021-03-01 NOTE — Progress Notes (Signed)
Office Visit Note   Patient: [name]           Date of Birth: Apr 22, 1951           MRN: [id number] Visit Date: 03/01/2021              Requested by: [name], [name] [place],  [place] [address] PCP: [name], [name]  Chief Complaint  Patient presents with  . Left Leg - Follow-up  . Right Leg - Follow-up      HPI: Patient presents in follow-up today bilateral venous insufficiency.  He admits he has not been changing his socks every day because he has not been able to get to the [place].  Assessment & Plan: Visit Diagnoses: No diagnosis found.  Plan: Emphasized the importance of washing his feet daily applying lotion and inspecting his feet.  I have given him an offloading doughnut for the left postop shoe.  We will follow-up in 1 week.  He understands the socks can be watched by hand and ride.  Follow-Up Instructions: No follow-ups on file.   Ortho Exam  Patient is alert, oriented, no adenopathy, well-dressed, normal affect, normal respiratory effort. Examination demonstrates bilateral venous insufficiency.  She has a small skin breakdown at skin level ulcer approximately 4 cm in diameter on the left leg there is no surrounding cellulitis or foul odor.  Neither great toe he has a blister which was debrided.  No surrounding cellulitis on the right leg chronic changes within the right great toe but actually slightly improved from last visit no ascending cellulitis on either side new socks were applied  Imaging: No results found. No images are attached to the encounter.  Labs: Lab Results  Component Value Date   HGBA1C 6.2 (H) 07/11/2018   HGBA1C 6.8 03/13/2017   HGBA1C 5.7 12/30/2015   ESRSEDRATE 70 (H) 04/04/2015   ESRSEDRATE 81 (H) 07/14/2011   ESRSEDRATE 117 (H) 06/28/2011   CRP 6.1 (H) 04/04/2015   CRP 0.9 (H) 07/14/2011   CRP 4.6 (H) 06/28/2011   LABURIC 9.7 (H) 02/23/2015   REPTSTATUS 09/15/2018 FINAL 09/10/2018   GRAMSTAIN   06/24/2011    RARE WBC PRESENT,BOTH PMN AND MONONUCLEAR NO ORGANISMS SEEN   GRAMSTAIN  06/24/2011    RARE WBC PRESENT,BOTH PMN AND MONONUCLEAR NO ORGANISMS SEEN   CULT  09/10/2018    NO GROWTH 5 DAYS Performed at [facility], [place] [address] [place], [place] [address]    LABORGA [facility] MORGANII 08/21/2015     Lab Results  Component Value Date   ALBUMIN 2.7 (L) 09/12/2018   ALBUMIN 3.1 (L) 09/11/2018   ALBUMIN 3.1 (L) 07/10/2018    Lab Results  Component Value Date   MG 2.0 04/13/2017   MG 1.9 03/22/2017   MG 1.7 03/04/2014   No results found for: VD25OH  No results found for: PREALBUMIN CBC EXTENDED Latest Ref Rng & Units 11/11/2020 09/12/2018 09/11/2018  WBC 4.0 - 10.5 K/uL 8.1 7.2 8.1  RBC 4.22 - 5.81 MIL/uL 3.68(L) 3.25(L) 3.46(L)  HGB 13.0 - 17.0 g/dL 10.9(L) 9.9(L) 10.5(L)  HCT 39.0 - 52.0 % 33.8(L) 30.0(L) 32.8(L)  PLT 150 - 400 K/uL 151 92(L) 82(L)  NEUTROABS 1.7 - 7.7 K/uL 5.7 - -  LYMPHSABS 0.7 - 4.0 K/uL 1.6 - -     There is no height or weight on file to calculate BMI.  Orders:  No orders of the defined types were placed  in this encounter.  No orders of the defined types were placed in this encounter.    Procedures: No procedures performed  Clinical Data: No additional findings.  ROS:  All other systems negative, except as noted in the HPI. Review of Systems  Objective: Vital Signs: There were no vitals taken for this visit.  Specialty Comments:  No specialty comments available.  PMFS History: Patient Active Problem List   Diagnosis Date Noted  . Venous stasis   . History of endocarditis   . Bradycardia   . Orthostasis   . Chest pain 09/10/2018  . Hypovolemia associated with hemodialysis 07/11/2018  . Weakness   . Thrombocytopenia ([place]) 07/10/2018  . Paroxysmal atrial fibrillation ([place]) 05/26/2017  . Balanitis 04/25/2017  . Penile swelling   . Scrotal swelling   . UTI (urinary tract infection) 04/13/2017  . Anemia of  chronic disease   . Acute encephalopathy 04/12/2017  . Urinary retention 04/10/2017  . Bacteremia   . RUQ abdominal pain   . Gross hematuria   . Other pancytopenia ([place])   . Sepsis ([place]) 03/21/2017  . Diarrhea 03/03/2017  . Retained lens material following cataract surgery of both eyes 11/08/2016  . Venous stasis ulcers of both lower extremities ([place]) 08/07/2015  . Varicose veins of lower extremities with complications 09/81/1914  . Malfunction of arteriovenous dialysis fistula ([place])   . CHF (congestive heart failure) ([place]) 06/23/2015  . Lymphedema distichiasis syndrome with kidney disease and diabetes mellitus ([place]) 04/06/2015  . Lymphedema of lower extremity 04/04/2015  . Type II diabetes mellitus with nephropathy ([place]) 04/04/2015  . MGUS (monoclonal gammopathy of unknown significance) 06/12/2014  . Chronic diastolic heart failure ([place]) 10/30/2013  . Special screening for malignant neoplasms, colon 07/09/2013  . Proliferative diabetic retinopathy ([place]) 10/23/2012  . Knee pain 08/21/2012  . Mass of thigh 08/21/2012  . FEVER UNSPECIFIED 05/06/2010  . ORGANIC IMPOTENCE 12/02/2009  . ONYCHOMYCOSIS 07/17/2009  . Lymphedema 05/06/2009  . ESRD on dialysis ([place]) 06/09/2008  . Hyperlipidemia 03/19/2007  . Class 2 severe obesity due to excess calories with serious comorbidity and body mass index (BMI) of 35.0 to 35.9 in adult ([place]) 02/08/2007  . Essential hypertension 02/08/2007  . Atherosclerosis of native arteries of extremity with intermittent claudication ([place]) 02/08/2007   Past Medical History:  Diagnosis Date  . Arthritis   . Asthma    as a child  . Chronic cystitis   . Chronic diastolic heart failure ([place])   . Chronic kidney disease    HD pt, 3 times a week.  . Diabetes mellitus   . Dysrhythmia   . GERD (gastroesophageal reflux disease)   . H/O hiatal hernia    states it's been fixed  . Hyperlipidemia   . Hypertension   . Lymphedema   . Morbid obesity ([place])   .  NEPHROLITHIASIS, HX OF 12/02/2009   Qualifier: Diagnosis of  By: [name], Cat    . PVD (peripheral vascular disease) ([place])   . Renal insufficiency   . UMBILICAL HERNIA 7/82/9562   Qualifier: History of  By: [name] MD, Audelia Acton    . Venous insufficiency     Family History  Problem Relation Age of Onset  . Diabetes Mother   . Heart disease Mother   . Diabetes Brother     Past Surgical History:  Procedure Laterality Date  . ABDOMINAL AORTOGRAM W/LOWER EXTREMITY N/A 05/19/2017   Procedure: Abdominal Aortogram w/Lower Extremity;  Surgeon: [name], MD;  Location: [facility];  Service: Cardiovascular;  Laterality: N/A;  . AMPUTATION     Right and left fifth toes.   . AMPUTATION TOE     emoval of both little toes  . AV FISTULA PLACEMENT Left 03/21/2014   Procedure: ARTERIOVENOUS (AV) FISTULA CREATION with ultrasound;  Surgeon: [name], MD;  Location: [facility];  Service: Vascular;  Laterality: Left;  . COLONOSCOPY    . EYE SURGERY Bilateral    cataract and lens implant  . HERNIA REPAIR    . LIGATION OF COMPETING BRANCHES OF ARTERIOVENOUS FISTULA Left 06/29/2015   Procedure: LIGATION OF LEFT ARM RADIOCEPHALIC ARTERIOVENOUS FISTULA SIDE BRANCHES;  Surgeon: Conrad Nikiski, MD;  Location: [facility];  Service: Vascular;  Laterality: Left;  [name] MULTIPLE EXTRACTIONS WITH ALVEOLOPLASTY N/A 04/07/2017   Procedure: Extraction of tooth #'s 1-11, 13, 14,16, 20-23, and 26-28 with alveoloplasty;  Surgeon: Lenn Cal, DDS;  Location: [facility];  Service: Oral Surgery;  Laterality: N/A;  . Popliteal to posterior tibial bypass     2006  . R knee arthoscopic repair of meniscus    . UMBILICAL HERNIA REPAIR     Social History   Occupational History    Employer: DISABLED   Tobacco Use  . Smoking status: Former Smoker    Quit date: 06/06/1979    Years since quitting: 41.7  . Smokeless tobacco: Former Network engineer  . Vaping Use: Never used  Substance and Sexual Activity  . Alcohol use: No     Alcohol/week: 0.0 standard drinks    Comment:   "years ago" , none now  . Drug use: No  . Sexual activity: Never

## 2021-03-02 ENCOUNTER — Encounter: Payer: Self-pay | Admitting: Orthopedic Surgery

## 2021-03-02 NOTE — Progress Notes (Signed)
Office Visit Note   Patient: [name]           Date of Birth: 1951-10-22           MRN: [id number] Visit Date: 02/15/2021              Requested by: [name], [name] [place],  [place] [address] PCP: [name], [name]  Chief Complaint  Patient presents with  . Left Leg - Follow-up  . Right Leg - Follow-up      HPI: Patient is a 70 year old gentleman with venous stasis and lymphatic insufficiency both lower extremities he has been in a compression wrap states that the swelling has decreased.  Assessment & Plan: Visit Diagnoses:  1. Lymphedema of both lower extremities     Plan: We will reapply 3 layer compression wraps bilaterally follow-up in 1 week anticipate advancing to compression stockings.  Follow-Up Instructions: Return in about 1 week (around 02/22/2021).   Ortho Exam  Patient is alert, oriented, no adenopathy, well-dressed, normal affect, normal respiratory effort. Examination there is decreased swelling in both legs there is increased wrinkling of the skin patient does have ischemic ulcers over both great toes and the right second toe the callus was removed these ulcers are superficial there is no cellulitis no drainage no exposed bone or tendon.  Imaging: No results found. No images are attached to the encounter.  Labs: Lab Results  Component Value Date   HGBA1C 6.2 (H) 07/11/2018   HGBA1C 6.8 03/13/2017   HGBA1C 5.7 12/30/2015   ESRSEDRATE 70 (H) 04/04/2015   ESRSEDRATE 81 (H) 07/14/2011   ESRSEDRATE 117 (H) 06/28/2011   CRP 6.1 (H) 04/04/2015   CRP 0.9 (H) 07/14/2011   CRP 4.6 (H) 06/28/2011   LABURIC 9.7 (H) 02/23/2015   REPTSTATUS 09/15/2018 FINAL 09/10/2018   GRAMSTAIN  06/24/2011    RARE WBC PRESENT,BOTH PMN AND MONONUCLEAR NO ORGANISMS SEEN   GRAMSTAIN  06/24/2011    RARE WBC PRESENT,BOTH PMN AND MONONUCLEAR NO ORGANISMS SEEN   CULT  09/10/2018    NO GROWTH 5 DAYS Performed at [facility], [place] [address], [place], [place] [address]    LABORGA [facility] MORGANII 08/21/2015     Lab Results  Component Value Date   ALBUMIN 2.7 (L) 09/12/2018   ALBUMIN 3.1 (L) 09/11/2018   ALBUMIN 3.1 (L) 07/10/2018    Lab Results  Component Value Date   MG 2.0 04/13/2017   MG 1.9 03/22/2017   MG 1.7 03/04/2014   No results found for: VD25OH  No results found for: PREALBUMIN CBC EXTENDED Latest Ref Rng & Units 11/11/2020 09/12/2018 09/11/2018  WBC 4.0 - 10.5 K/uL 8.1 7.2 8.1  RBC 4.22 - 5.81 MIL/uL 3.68(L) 3.25(L) 3.46(L)  HGB 13.0 - 17.0 g/dL 10.9(L) 9.9(L) 10.5(L)  HCT 39.0 - 52.0 % 33.8(L) 30.0(L) 32.8(L)  PLT 150 - 400 K/uL 151 92(L) 82(L)  NEUTROABS 1.7 - 7.7 K/uL 5.7 - -  LYMPHSABS 0.7 - 4.0 K/uL 1.6 - -     There is no height or weight on file to calculate BMI.  Orders:  No orders of the defined types were placed in this encounter.  No orders of the defined types were placed in this encounter.    Procedures: No procedures performed  Clinical Data: No additional findings.  ROS:  All other systems negative, except as noted in the HPI. Review of Systems  Objective: Vital Signs: There were no  vitals taken for this visit.  Specialty Comments:  No specialty comments available.  PMFS History: Patient Active Problem List   Diagnosis Date Noted  . Venous stasis   . History of endocarditis   . Bradycardia   . Orthostasis   . Chest pain 09/10/2018  . Hypovolemia associated with hemodialysis 07/11/2018  . Weakness   . Thrombocytopenia ([place]) 07/10/2018  . Paroxysmal atrial fibrillation ([place]) 05/26/2017  . Balanitis 04/25/2017  . Penile swelling   . Scrotal swelling   . UTI (urinary tract infection) 04/13/2017  . Anemia of chronic disease   . Acute encephalopathy 04/12/2017  . Urinary retention 04/10/2017  . Bacteremia   . RUQ abdominal pain   . Gross hematuria   . Other pancytopenia ([place])   . Sepsis ([place]) 03/21/2017  . Diarrhea 03/03/2017  . Retained lens  material following cataract surgery of both eyes 11/08/2016  . Venous stasis ulcers of both lower extremities ([place]) 08/07/2015  . Varicose veins of lower extremities with complications 73/22/0254  . Malfunction of arteriovenous dialysis fistula ([place])   . CHF (congestive heart failure) ([place]) 06/23/2015  . Lymphedema distichiasis syndrome with kidney disease and diabetes mellitus ([place]) 04/06/2015  . Lymphedema of lower extremity 04/04/2015  . Type II diabetes mellitus with nephropathy ([place]) 04/04/2015  . MGUS (monoclonal gammopathy of unknown significance) 06/12/2014  . Chronic diastolic heart failure ([place]) 10/30/2013  . Special screening for malignant neoplasms, colon 07/09/2013  . Proliferative diabetic retinopathy ([place]) 10/23/2012  . Knee pain 08/21/2012  . Mass of thigh 08/21/2012  . FEVER UNSPECIFIED 05/06/2010  . ORGANIC IMPOTENCE 12/02/2009  . ONYCHOMYCOSIS 07/17/2009  . Lymphedema 05/06/2009  . ESRD on dialysis ([place]) 06/09/2008  . Hyperlipidemia 03/19/2007  . Class 2 severe obesity due to excess calories with serious comorbidity and body mass index (BMI) of 35.0 to 35.9 in adult ([place]) 02/08/2007  . Essential hypertension 02/08/2007  . Atherosclerosis of native arteries of extremity with intermittent claudication ([place]) 02/08/2007   Past Medical History:  Diagnosis Date  . Arthritis   . Asthma    as a child  . Chronic cystitis   . Chronic diastolic heart failure ([place])   . Chronic kidney disease    HD pt, 3 times a week.  . Diabetes mellitus   . Dysrhythmia   . GERD (gastroesophageal reflux disease)   . H/O hiatal hernia    states it's been fixed  . Hyperlipidemia   . Hypertension   . Lymphedema   . Morbid obesity ([place])   . NEPHROLITHIASIS, HX OF 12/02/2009   Qualifier: Diagnosis of  By: [name], Cat    . PVD (peripheral vascular disease) ([place])   . Renal insufficiency   . UMBILICAL HERNIA 2/70/6237   Qualifier: History of  By: [name] MD, Audelia Acton    . Venous  insufficiency     Family History  Problem Relation Age of Onset  . Diabetes Mother   . Heart disease Mother   . Diabetes Brother     Past Surgical History:  Procedure Laterality Date  . ABDOMINAL AORTOGRAM W/LOWER EXTREMITY N/A 05/19/2017   Procedure: Abdominal Aortogram w/Lower Extremity;  Surgeon: [name], MD;  Location: [facility];  Service: Cardiovascular;  Laterality: N/A;  . AMPUTATION     Right and left fifth toes.   . AMPUTATION TOE     emoval of both little toes  . AV FISTULA PLACEMENT Left 03/21/2014   Procedure: ARTERIOVENOUS (AV) FISTULA CREATION with ultrasound;  Surgeon:  [name], MD;  Location: [facility];  Service: Vascular;  Laterality: Left;  . COLONOSCOPY    . EYE SURGERY Bilateral    cataract and lens implant  . HERNIA REPAIR    . LIGATION OF COMPETING BRANCHES OF ARTERIOVENOUS FISTULA Left 06/29/2015   Procedure: LIGATION OF LEFT ARM RADIOCEPHALIC ARTERIOVENOUS FISTULA SIDE BRANCHES;  Surgeon: Conrad [place]let, MD;  Location: [facility];  Service: Vascular;  Laterality: Left;  [name] MULTIPLE EXTRACTIONS WITH ALVEOLOPLASTY N/A 04/07/2017   Procedure: Extraction of tooth #'s 1-11, 13, 14,16, 20-23, and 26-28 with alveoloplasty;  Surgeon: Lenn Cal, DDS;  Location: [facility];  Service: Oral Surgery;  Laterality: N/A;  . Popliteal to posterior tibial bypass     2006  . R knee arthoscopic repair of meniscus    . UMBILICAL HERNIA REPAIR     Social History   Occupational History    Employer: DISABLED   Tobacco Use  . Smoking status: Former Smoker    Quit date: 06/06/1979    Years since quitting: 41.7  . Smokeless tobacco: Former Network engineer  . Vaping Use: Never used  Substance and Sexual Activity  . Alcohol use: No    Alcohol/week: 0.0 standard drinks    Comment:   "years ago" , none now  . Drug use: No  . Sexual activity: Never

## 2021-03-08 ENCOUNTER — Ambulatory Visit (INDEPENDENT_AMBULATORY_CARE_PROVIDER_SITE_OTHER): Payer: Medicare Other | Admitting: Physician Assistant

## 2021-03-08 ENCOUNTER — Encounter: Payer: Self-pay | Admitting: Orthopedic Surgery

## 2021-03-08 VITALS — Ht 71.0 in | Wt 220.0 lb

## 2021-03-08 DIAGNOSIS — E11621 Type 2 diabetes mellitus with foot ulcer: Secondary | ICD-10-CM | POA: Diagnosis not present

## 2021-03-08 DIAGNOSIS — L97529 Non-pressure chronic ulcer of other part of left foot with unspecified severity: Secondary | ICD-10-CM

## 2021-03-08 NOTE — Progress Notes (Signed)
Office Visit Note   Patient: [name]           Date of Birth: 07-08-1951           MRN: [id number] Visit Date: 03/08/2021              Requested by: [name], [name] [place],  [place] [id number] PCP: [name], [name]  Chief Complaint  Patient presents with  . Left Foot - Follow-up      HPI: Presents today for follow-up on his bilateral venous stasis.  He does have socks but admits he is only changed them one time since his last visit.  He is wearing bilateral postop shoes.  Assessment & Plan: Visit Diagnoses: No diagnosis found.  Plan: I have ongoing concerns with her great toes.  They are no worse.  I would do try to emphasize with him the importance of cleansing his feet daily and changing the socks.  Will follow up with Dr. Sharol Given in 1 week.  Follow-Up Instructions: No follow-ups on file.   Ortho Exam  Patient is alert, oriented, no adenopathy, well-dressed, normal affect, normal respiratory effort. Examination bilateral lower extremity venous stasis.  On the right foot he does have some skin breakdown at the end of the great toe but does not probe deeply no purulent drainage or surrounding or ascending cellulitis.  On the left side he has a resolved area on the dorsum of his great toe but on the bottom still has a superficial ulcer does not probe deeply no ascending cellulitis no foul odor he has 1 small open ulcer on the left leg.  Measures about 3 cm in diameter no ascending cellulitis  Imaging: No results found. No images are attached to the encounter.  Labs: Lab Results  Component Value Date   HGBA1C 6.2 (H) 07/11/2018   HGBA1C 6.8 03/13/2017   HGBA1C 5.7 12/30/2015   ESRSEDRATE 70 (H) 04/04/2015   ESRSEDRATE 81 (H) 07/14/2011   ESRSEDRATE 117 (H) 06/28/2011   CRP 6.1 (H) 04/04/2015   CRP 0.9 (H) 07/14/2011   CRP 4.6 (H) 06/28/2011   LABURIC 9.7 (H) 02/23/2015   REPTSTATUS 09/15/2018 FINAL 09/10/2018   GRAMSTAIN  06/24/2011     RARE WBC PRESENT,BOTH PMN AND MONONUCLEAR NO ORGANISMS SEEN   GRAMSTAIN  06/24/2011    RARE WBC PRESENT,BOTH PMN AND MONONUCLEAR NO ORGANISMS SEEN   CULT  09/10/2018    NO GROWTH 5 DAYS Performed at [facility], [place] [address] [place], [place] [address]    [facility] MORGANII 08/21/2015     Lab Results  Component Value Date   ALBUMIN 2.7 (L) 09/12/2018   ALBUMIN 3.1 (L) 09/11/2018   ALBUMIN 3.1 (L) 07/10/2018    Lab Results  Component Value Date   MG 2.0 04/13/2017   MG 1.9 03/22/2017   MG 1.7 03/04/2014   No results found for: VD25OH  No results found for: PREALBUMIN CBC EXTENDED Latest Ref Rng & Units 11/11/2020 09/12/2018 09/11/2018  WBC 4.0 - 10.5 K/uL 8.1 7.2 8.1  RBC 4.22 - 5.81 MIL/uL 3.68(L) 3.25(L) 3.46(L)  HGB 13.0 - 17.0 g/dL 10.9(L) 9.9(L) 10.5(L)  HCT 39.0 - 52.0 % 33.8(L) 30.0(L) 32.8(L)  PLT 150 - 400 K/uL 151 92(L) 82(L)  NEUTROABS 1.7 - 7.7 K/uL 5.7 - -  LYMPHSABS 0.7 - 4.0 K/uL 1.6 - -     Body mass index is 30.68 kg/m.  Orders:  No orders  of the defined types were placed in this encounter.  No orders of the defined types were placed in this encounter.    Procedures: No procedures performed  Clinical Data: No additional findings.  ROS:  All other systems negative, except as noted in the HPI. Review of Systems  Objective: Vital Signs: Ht 5\' 11"  (1.803 m)   Wt 220 lb (99.8 kg)   BMI 30.68 kg/m   Specialty Comments:  No specialty comments available.  PMFS History: Patient Active Problem List   Diagnosis Date Noted  . Venous stasis   . History of endocarditis   . Bradycardia   . Orthostasis   . Chest pain 09/10/2018  . Hypovolemia associated with hemodialysis 07/11/2018  . Weakness   . Thrombocytopenia ([place]) 07/10/2018  . Paroxysmal atrial fibrillation ([place]) 05/26/2017  . Balanitis 04/25/2017  . Penile swelling   . Scrotal swelling   . UTI (urinary tract infection) 04/13/2017  . Anemia of chronic disease    . Acute encephalopathy 04/12/2017  . Urinary retention 04/10/2017  . Bacteremia   . RUQ abdominal pain   . Gross hematuria   . Other pancytopenia ([place])   . Sepsis ([place]) 03/21/2017  . Diarrhea 03/03/2017  . Retained lens material following cataract surgery of both eyes 11/08/2016  . Venous stasis ulcers of both lower extremities ([place]) 08/07/2015  . Varicose veins of lower extremities with complications 18/29/9371  . Malfunction of arteriovenous dialysis fistula ([place])   . CHF (congestive heart failure) ([place]) 06/23/2015  . Lymphedema distichiasis syndrome with kidney disease and diabetes mellitus ([place]) 04/06/2015  . Lymphedema of lower extremity 04/04/2015  . Type II diabetes mellitus with nephropathy ([place]) 04/04/2015  . MGUS (monoclonal gammopathy of unknown significance) 06/12/2014  . Chronic diastolic heart failure ([place]) 10/30/2013  . Special screening for malignant neoplasms, colon 07/09/2013  . Proliferative diabetic retinopathy ([place]) 10/23/2012  . Knee pain 08/21/2012  . Mass of thigh 08/21/2012  . FEVER UNSPECIFIED 05/06/2010  . ORGANIC IMPOTENCE 12/02/2009  . ONYCHOMYCOSIS 07/17/2009  . Lymphedema 05/06/2009  . ESRD on dialysis ([place]) 06/09/2008  . Hyperlipidemia 03/19/2007  . Class 2 severe obesity due to excess calories with serious comorbidity and body mass index (BMI) of 35.0 to 35.9 in adult ([place]) 02/08/2007  . Essential hypertension 02/08/2007  . Atherosclerosis of native arteries of extremity with intermittent claudication ([place]) 02/08/2007   Past Medical History:  Diagnosis Date  . Arthritis   . Asthma    as a child  . Chronic cystitis   . Chronic diastolic heart failure ([place])   . Chronic kidney disease    HD pt, 3 times a week.  . Diabetes mellitus   . Dysrhythmia   . GERD (gastroesophageal reflux disease)   . H/O hiatal hernia    states it's been fixed  . Hyperlipidemia   . Hypertension   . Lymphedema   . Morbid obesity ([place])   . NEPHROLITHIASIS, HX OF  12/02/2009   Qualifier: Diagnosis of  By: [name], Cat    . PVD (peripheral vascular disease) ([place])   . Renal insufficiency   . UMBILICAL HERNIA 6/96/7893   Qualifier: History of  By: [name] MD, Audelia Acton    . Venous insufficiency     Family History  Problem Relation Age of Onset  . Diabetes Mother   . Heart disease Mother   . Diabetes Brother     Past Surgical History:  Procedure Laterality Date  . ABDOMINAL AORTOGRAM W/LOWER EXTREMITY N/A 05/19/2017  Procedure: Abdominal Aortogram w/Lower Extremity;  Surgeon: [name], MD;  Location: [facility];  Service: Cardiovascular;  Laterality: N/A;  . AMPUTATION     Right and left fifth toes.   . AMPUTATION TOE     emoval of both little toes  . AV FISTULA PLACEMENT Left 03/21/2014   Procedure: ARTERIOVENOUS (AV) FISTULA CREATION with ultrasound;  Surgeon: [name], MD;  Location: [facility];  Service: Vascular;  Laterality: Left;  . COLONOSCOPY    . EYE SURGERY Bilateral    cataract and lens implant  . HERNIA REPAIR    . LIGATION OF COMPETING BRANCHES OF ARTERIOVENOUS FISTULA Left 06/29/2015   Procedure: LIGATION OF LEFT ARM RADIOCEPHALIC ARTERIOVENOUS FISTULA SIDE BRANCHES;  Surgeon: Conrad , MD;  Location: [facility];  Service: Vascular;  Laterality: Left;  [name] MULTIPLE EXTRACTIONS WITH ALVEOLOPLASTY N/A 04/07/2017   Procedure: Extraction of tooth #'s 1-11, 13, 14,16, 20-23, and 26-28 with alveoloplasty;  Surgeon: Lenn Cal, DDS;  Location: [facility];  Service: Oral Surgery;  Laterality: N/A;  . Popliteal to posterior tibial bypass     2006  . R knee arthoscopic repair of meniscus    . UMBILICAL HERNIA REPAIR     Social History   Occupational History    Employer: DISABLED   Tobacco Use  . Smoking status: Former Smoker    Quit date: 06/06/1979    Years since quitting: 41.7  . Smokeless tobacco: Former Network engineer  . Vaping Use: Never used  Substance and Sexual Activity  . Alcohol use: No    Alcohol/week: 0.0  standard drinks    Comment:   "years ago" , none now  . Drug use: No  . Sexual activity: Never

## 2021-03-15 ENCOUNTER — Ambulatory Visit: Payer: Medicare Other | Admitting: Orthopedic Surgery

## 2021-03-18 ENCOUNTER — Ambulatory Visit (INDEPENDENT_AMBULATORY_CARE_PROVIDER_SITE_OTHER): Payer: Medicare Other | Admitting: Orthopedic Surgery

## 2021-03-18 ENCOUNTER — Encounter: Payer: Self-pay | Admitting: Orthopedic Surgery

## 2021-03-18 VITALS — Ht 71.0 in | Wt 220.0 lb

## 2021-03-18 DIAGNOSIS — L97919 Non-pressure chronic ulcer of unspecified part of right lower leg with unspecified severity: Secondary | ICD-10-CM | POA: Diagnosis not present

## 2021-03-18 DIAGNOSIS — I87333 Chronic venous hypertension (idiopathic) with ulcer and inflammation of bilateral lower extremity: Secondary | ICD-10-CM

## 2021-03-18 DIAGNOSIS — E11621 Type 2 diabetes mellitus with foot ulcer: Secondary | ICD-10-CM

## 2021-03-18 DIAGNOSIS — L97929 Non-pressure chronic ulcer of unspecified part of left lower leg with unspecified severity: Secondary | ICD-10-CM

## 2021-03-18 DIAGNOSIS — L97529 Non-pressure chronic ulcer of other part of left foot with unspecified severity: Secondary | ICD-10-CM

## 2021-03-18 NOTE — Progress Notes (Signed)
Office Visit Note   Patient: [name]           Date of Birth: 1951-01-24           MRN: [id number] Visit Date: 03/18/2021              Requested by: [name], [name] [place],  [place] [id number] PCP: [name], [name]  Chief Complaint  Patient presents with  . Left Hip - Follow-up  . Right Leg - Follow-up      HPI: Patient is a 70 year old gentleman with venous stasis changes and lymphedema bilateral lower extremity with diabetic insensate neuropathic ulcers forefoot bilaterally.  Patient tried a course of compression stockings that appear to be over-the-counter.  Patient has had increased swelling and developed an ulcer over the dorsum of the right ankle that he states came from the compression sock.  He states he is almost completed his antibiotics.  States that there is drainage from the ulcer on the lateral left leg.  Assessment & Plan: Visit Diagnoses:  1. Ulcer of left great toe due to diabetes mellitus ([place])   2. Chronic venous hypertension (idiopathic) with ulcer and inflammation of bilateral lower extremity (HCC)     Plan: Will place him back in a Dynaflex compression wrap bilaterally due to the increased swelling we will need to evaluate for compression stockings at follow-up.  Follow-Up Instructions: Return in about 1 week (around 03/25/2021).   Ortho Exam  Patient is alert, oriented, no adenopathy, well-dressed, normal affect, normal respiratory effort. Examination patient has a wound 1 cm diameter laterally of the left leg with 100% healthy granulation tissue there is a small amount of drainage and this was touched with silver nitrate.  The ulcers on the left forefoot are healing.  Semination the right foot the forefoot ulcers are also healing as well he has a small area of hyper granulation tissue over the dorsum of the ankle this was touched with silver nitrate this was 5 mm in diameter with proud flesh.  There is no cellulitis in either  leg.  He has brawny skin color changes with dry cracked skin circumferential on both legs.  Imaging: No results found. No images are attached to the encounter.  Labs: Lab Results  Component Value Date   HGBA1C 6.2 (H) 07/11/2018   HGBA1C 6.8 03/13/2017   HGBA1C 5.7 12/30/2015   ESRSEDRATE 70 (H) 04/04/2015   ESRSEDRATE 81 (H) 07/14/2011   ESRSEDRATE 117 (H) 06/28/2011   CRP 6.1 (H) 04/04/2015   CRP 0.9 (H) 07/14/2011   CRP 4.6 (H) 06/28/2011   LABURIC 9.7 (H) 02/23/2015   REPTSTATUS 09/15/2018 FINAL 09/10/2018   GRAMSTAIN  06/24/2011    RARE WBC PRESENT,BOTH PMN AND MONONUCLEAR NO ORGANISMS SEEN   GRAMSTAIN  06/24/2011    RARE WBC PRESENT,BOTH PMN AND MONONUCLEAR NO ORGANISMS SEEN   CULT  09/10/2018    NO GROWTH 5 DAYS Performed at [facility], [place] [address], [place], [place] [address]    Doy Hutching [facility] MORGANII 08/21/2015     Lab Results  Component Value Date   ALBUMIN 2.7 (L) 09/12/2018   ALBUMIN 3.1 (L) 09/11/2018   ALBUMIN 3.1 (L) 07/10/2018    Lab Results  Component Value Date   MG 2.0 04/13/2017   MG 1.9 03/22/2017   MG 1.7 03/04/2014   No results found for: VD25OH  No results found for: PREALBUMIN CBC EXTENDED Latest Ref Rng & Units  11/11/2020 09/12/2018 09/11/2018  WBC 4.0 - 10.5 K/uL 8.1 7.2 8.1  RBC 4.22 - 5.81 MIL/uL 3.68(L) 3.25(L) 3.46(L)  HGB 13.0 - 17.0 g/dL 10.9(L) 9.9(L) 10.5(L)  HCT 39.0 - 52.0 % 33.8(L) 30.0(L) 32.8(L)  PLT 150 - 400 K/uL 151 92(L) 82(L)  NEUTROABS 1.7 - 7.7 K/uL 5.7 - -  LYMPHSABS 0.7 - 4.0 K/uL 1.6 - -     Body mass index is 30.68 kg/m.  Orders:  No orders of the defined types were placed in this encounter.  No orders of the defined types were placed in this encounter.    Procedures: No procedures performed  Clinical Data: No additional findings.  ROS:  All other systems negative, except as noted in the HPI. Review of Systems  Objective: Vital Signs: Ht 5\' 11"  (1.803 m)   Wt 220 lb (99.8  kg)   BMI 30.68 kg/m   Specialty Comments:  No specialty comments available.  PMFS History: Patient Active Problem List   Diagnosis Date Noted  . Venous stasis   . History of endocarditis   . Bradycardia   . Orthostasis   . Chest pain 09/10/2018  . Hypovolemia associated with hemodialysis 07/11/2018  . Weakness   . Thrombocytopenia ([place]) 07/10/2018  . Paroxysmal atrial fibrillation ([place]) 05/26/2017  . Balanitis 04/25/2017  . Penile swelling   . Scrotal swelling   . UTI (urinary tract infection) 04/13/2017  . Anemia of chronic disease   . Acute encephalopathy 04/12/2017  . Urinary retention 04/10/2017  . Bacteremia   . RUQ abdominal pain   . Gross hematuria   . Other pancytopenia ([place])   . Sepsis ([place]) 03/21/2017  . Diarrhea 03/03/2017  . Retained lens material following cataract surgery of both eyes 11/08/2016  . Venous stasis ulcers of both lower extremities ([place]) 08/07/2015  . Varicose veins of lower extremities with complications 66/05/3015  . Malfunction of arteriovenous dialysis fistula ([place])   . CHF (congestive heart failure) ([place]) 06/23/2015  . Lymphedema distichiasis syndrome with kidney disease and diabetes mellitus ([place]) 04/06/2015  . Lymphedema of lower extremity 04/04/2015  . Type II diabetes mellitus with nephropathy ([place]) 04/04/2015  . MGUS (monoclonal gammopathy of unknown significance) 06/12/2014  . Chronic diastolic heart failure ([place]) 10/30/2013  . Special screening for malignant neoplasms, colon 07/09/2013  . Proliferative diabetic retinopathy ([place]) 10/23/2012  . Knee pain 08/21/2012  . Mass of thigh 08/21/2012  . FEVER UNSPECIFIED 05/06/2010  . ORGANIC IMPOTENCE 12/02/2009  . ONYCHOMYCOSIS 07/17/2009  . Lymphedema 05/06/2009  . ESRD on dialysis ([place]) 06/09/2008  . Hyperlipidemia 03/19/2007  . Class 2 severe obesity due to excess calories with serious comorbidity and body mass index (BMI) of 35.0 to 35.9 in adult ([place]) 02/08/2007  . Essential  hypertension 02/08/2007  . Atherosclerosis of native arteries of extremity with intermittent claudication ([place]) 02/08/2007   Past Medical History:  Diagnosis Date  . Arthritis   . Asthma    as a child  . Chronic cystitis   . Chronic diastolic heart failure ([place])   . Chronic kidney disease    HD pt, 3 times a week.  . Diabetes mellitus   . Dysrhythmia   . GERD (gastroesophageal reflux disease)   . H/O hiatal hernia    states it's been fixed  . Hyperlipidemia   . Hypertension   . Lymphedema   . Morbid obesity ([place])   . NEPHROLITHIASIS, HX OF 12/02/2009   Qualifier: Diagnosis of  By: [name], Cat    .  PVD (peripheral vascular disease) ([place])   . Renal insufficiency   . UMBILICAL HERNIA 6/38/1771   Qualifier: History of  By: [name] MD, Audelia Acton    . Venous insufficiency     Family History  Problem Relation Age of Onset  . Diabetes Mother   . Heart disease Mother   . Diabetes Brother     Past Surgical History:  Procedure Laterality Date  . ABDOMINAL AORTOGRAM W/LOWER EXTREMITY N/A 05/19/2017   Procedure: Abdominal Aortogram w/Lower Extremity;  Surgeon: [name], MD;  Location: [facility];  Service: Cardiovascular;  Laterality: N/A;  . AMPUTATION     Right and left fifth toes.   . AMPUTATION TOE     emoval of both little toes  . AV FISTULA PLACEMENT Left 03/21/2014   Procedure: ARTERIOVENOUS (AV) FISTULA CREATION with ultrasound;  Surgeon: [name], MD;  Location: [facility];  Service: Vascular;  Laterality: Left;  . COLONOSCOPY    . EYE SURGERY Bilateral    cataract and lens implant  . HERNIA REPAIR    . LIGATION OF COMPETING BRANCHES OF ARTERIOVENOUS FISTULA Left 06/29/2015   Procedure: LIGATION OF LEFT ARM RADIOCEPHALIC ARTERIOVENOUS FISTULA SIDE BRANCHES;  Surgeon: Conrad [place], MD;  Location: [facility];  Service: Vascular;  Laterality: Left;  [name] MULTIPLE EXTRACTIONS WITH ALVEOLOPLASTY N/A 04/07/2017   Procedure: Extraction of tooth #'s 1-11, 13, 14,16, 20-23, and  26-28 with alveoloplasty;  Surgeon: Lenn Cal, DDS;  Location: [facility];  Service: Oral Surgery;  Laterality: N/A;  . Popliteal to posterior tibial bypass     2006  . R knee arthoscopic repair of meniscus    . UMBILICAL HERNIA REPAIR     Social History   Occupational History    Employer: DISABLED   Tobacco Use  . Smoking status: Former Smoker    Quit date: 06/06/1979    Years since quitting: 41.8  . Smokeless tobacco: Former Network engineer  . Vaping Use: Never used  Substance and Sexual Activity  . Alcohol use: No    Alcohol/week: 0.0 standard drinks    Comment:   "years ago" , none now  . Drug use: No  . Sexual activity: Never

## 2021-03-25 ENCOUNTER — Ambulatory Visit: Payer: Medicare Other | Admitting: Orthopedic Surgery

## 2021-03-29 ENCOUNTER — Ambulatory Visit: Payer: Self-pay

## 2021-03-29 ENCOUNTER — Ambulatory Visit (INDEPENDENT_AMBULATORY_CARE_PROVIDER_SITE_OTHER): Payer: Medicare Other | Admitting: Orthopedic Surgery

## 2021-03-29 DIAGNOSIS — L97929 Non-pressure chronic ulcer of unspecified part of left lower leg with unspecified severity: Secondary | ICD-10-CM | POA: Diagnosis not present

## 2021-03-29 DIAGNOSIS — I87333 Chronic venous hypertension (idiopathic) with ulcer and inflammation of bilateral lower extremity: Secondary | ICD-10-CM

## 2021-03-29 DIAGNOSIS — E1121 Type 2 diabetes mellitus with diabetic nephropathy: Secondary | ICD-10-CM | POA: Diagnosis not present

## 2021-03-29 DIAGNOSIS — L97919 Non-pressure chronic ulcer of unspecified part of right lower leg with unspecified severity: Secondary | ICD-10-CM | POA: Diagnosis not present

## 2021-03-30 ENCOUNTER — Encounter: Payer: Self-pay | Admitting: Orthopedic Surgery
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
## 2021-04-05 ENCOUNTER — Ambulatory Visit (INDEPENDENT_AMBULATORY_CARE_PROVIDER_SITE_OTHER): Payer: Medicare Other | Admitting: Physician Assistant

## 2021-04-05 ENCOUNTER — Encounter: Payer: Self-pay | Admitting: Orthopedic Surgery

## 2021-04-05 VITALS — Ht 71.0 in | Wt 220.0 lb

## 2021-04-05 DIAGNOSIS — I878 Other specified disorders of veins: Secondary | ICD-10-CM
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
## 2021-04-12 ENCOUNTER — Ambulatory Visit (INDEPENDENT_AMBULATORY_CARE_PROVIDER_SITE_OTHER): Payer: Medicare Other | Admitting: Orthopedic Surgery

## 2021-04-12 ENCOUNTER — Encounter: Payer: Self-pay | Admitting: Orthopedic Surgery

## 2021-04-12 DIAGNOSIS — E1121 Type 2 diabetes mellitus with diabetic nephropathy: Secondary | ICD-10-CM | POA: Diagnosis not present

## 2021-04-12 DIAGNOSIS — L97919 Non-pressure chronic ulcer of unspecified part of right lower leg with unspecified severity: Secondary | ICD-10-CM

## 2021-04-12 DIAGNOSIS — E11621 Type 2 diabetes mellitus with foot ulcer: Secondary | ICD-10-CM | POA: Diagnosis not present

## 2021-04-12 DIAGNOSIS — I87333 Chronic venous hypertension (idiopathic) with ulcer and inflammation of bilateral lower extremity: Secondary | ICD-10-CM

## 2021-04-12 DIAGNOSIS — L97929 Non-pressure chronic ulcer of unspecified part of left lower leg with unspecified severity: Secondary | ICD-10-CM

## 2021-04-12 DIAGNOSIS — L97529 Non-pressure chronic ulcer of other part of left foot with unspecified severity: Secondary | ICD-10-CM

## 2021-04-12 NOTE — Progress Notes (Signed)
Office Visit Note   Patient: [name]           Date of Birth: Oct 10, 1951           MRN: [id number] Visit Date: 04/12/2021              Requested by: [name], [name] [place],  [place] [address] PCP: [name], [name]  Chief Complaint  Patient presents with  . Right Leg - Follow-up  . Left Leg - Follow-up      HPI: Patient is a 70 year old gentleman who presents in follow-up for diabetic insensate ulcers on both the as well as venous and lymphatic insufficiency of both legs status post serial compression wraps.  Patient brought a pair of copper socks with him that he bought from [employer] to use as his compression socks.  Assessment & Plan: Visit Diagnoses:  1. Type II diabetes mellitus with nephropathy ([place])   2. Chronic venous hypertension (idiopathic) with ulcer and inflammation of bilateral lower extremity (HCC)   3. Ulcer of left great toe due to diabetes mellitus ([place])     Plan: Patient will start wearing the compression socks daily follow-up in 1 week.  I feel patient most likely will need at least 15-20 compression possibly even 20-30 compression for his legs which will not be provided by the [employer] copper socks.  Follow-Up Instructions: Return in about 1 week (around 04/19/2021).   Ortho Exam  Patient is alert, oriented, no adenopathy, well-dressed, normal affect, normal respiratory effort. Examination patient continues to have decreased swelling of both legs there is dry cracked skin with brawny skin color changes.  The right calf is 37 cm in circumference left calf is 35 cm in circumference.  He has a Wagner grade 1 ulcer beneath the first metatarsal head of the left foot.  After informed consent a 10 blade knife was used debride the skin and soft tissue back to bleeding viable granulation tissue there is no exposed bone or tendon there is no depth no tunneling after debridement the ulcer is 3 cm in diameter 1 mm deep prior to debridement the  ulcer is 1 cm diameter.  This was touched with silver nitrate for hemostasis and a Band-Aid was applied.  Patient also has an ulcer over the tip of the second toe right foot this was also debrided back to bleeding viable granulation tissue there is no exposed bone.  This was touched with silver nitrate and a Band-Aid was applied.  Imaging: No results found. No images are attached to the encounter.  Labs: Lab Results  Component Value Date   HGBA1C 6.2 (H) 07/11/2018   HGBA1C 6.8 03/13/2017   HGBA1C 5.7 12/30/2015   ESRSEDRATE 70 (H) 04/04/2015   ESRSEDRATE 81 (H) 07/14/2011   ESRSEDRATE 117 (H) 06/28/2011   CRP 6.1 (H) 04/04/2015   CRP 0.9 (H) 07/14/2011   CRP 4.6 (H) 06/28/2011   LABURIC 9.7 (H) 02/23/2015   REPTSTATUS 09/15/2018 FINAL 09/10/2018   GRAMSTAIN  06/24/2011    RARE WBC PRESENT,BOTH PMN AND MONONUCLEAR NO ORGANISMS SEEN   GRAMSTAIN  06/24/2011    RARE WBC PRESENT,BOTH PMN AND MONONUCLEAR NO ORGANISMS SEEN   CULT  09/10/2018    NO GROWTH 5 DAYS Performed at [facility], [place] [address], [place], [place] [address]    LABORGA MORGANELLA MORGANII 08/21/2015     Lab Results  Component Value Date   ALBUMIN 2.7 (L) 09/12/2018  ALBUMIN 3.1 (L) 09/11/2018   ALBUMIN 3.1 (L) 07/10/2018    Lab Results  Component Value Date   MG 2.0 04/13/2017   MG 1.9 03/22/2017   MG 1.7 03/04/2014   No results found for: VD25OH  No results found for: PREALBUMIN CBC EXTENDED Latest Ref Rng & Units 11/11/2020 09/12/2018 09/11/2018  WBC 4.0 - 10.5 K/uL 8.1 7.2 8.1  RBC 4.22 - 5.81 MIL/uL 3.68(L) 3.25(L) 3.46(L)  HGB 13.0 - 17.0 g/dL 10.9(L) 9.9(L) 10.5(L)  HCT 39.0 - 52.0 % 33.8(L) 30.0(L) 32.8(L)  PLT 150 - 400 K/uL 151 92(L) 82(L)  NEUTROABS 1.7 - 7.7 K/uL 5.7 - -  LYMPHSABS 0.7 - 4.0 K/uL 1.6 - -     There is no height or weight on file to calculate BMI.  Orders:  No orders of the defined types were placed in this encounter.  No orders of the defined types were  placed in this encounter.    Procedures: No procedures performed  Clinical Data: No additional findings.  ROS:  All other systems negative, except as noted in the HPI. Review of Systems  Objective: Vital Signs: There were no vitals taken for this visit.  Specialty Comments:  No specialty comments available.  PMFS History: Patient Active Problem List   Diagnosis Date Noted  . Venous stasis   . History of endocarditis   . Bradycardia   . Orthostasis   . Chest pain 09/10/2018  . Hypovolemia associated with hemodialysis 07/11/2018  . Weakness   . Thrombocytopenia ([place]) 07/10/2018  . Paroxysmal atrial fibrillation ([place]) 05/26/2017  . Balanitis 04/25/2017  . Penile swelling   . Scrotal swelling   . UTI (urinary tract infection) 04/13/2017  . Anemia of chronic disease   . Acute encephalopathy 04/12/2017  . Urinary retention 04/10/2017  . Bacteremia   . RUQ abdominal pain   . Gross hematuria   . Other pancytopenia ([place])   . Sepsis ([place]) 03/21/2017  . Diarrhea 03/03/2017  . Retained lens material following cataract surgery of both eyes 11/08/2016  . Venous stasis ulcers of both lower extremities ([place]) 08/07/2015  . Varicose veins of lower extremities with complications 53/61/4431  . Malfunction of arteriovenous dialysis fistula ([place])   . CHF (congestive heart failure) ([place]) 06/23/2015  . Lymphedema distichiasis syndrome with kidney disease and diabetes mellitus ([place]) 04/06/2015  . Lymphedema of lower extremity 04/04/2015  . Type II diabetes mellitus with nephropathy ([place]) 04/04/2015  . MGUS (monoclonal gammopathy of unknown significance) 06/12/2014  . Chronic diastolic heart failure ([place]) 10/30/2013  . Special screening for malignant neoplasms, colon 07/09/2013  . Proliferative diabetic retinopathy ([place]) 10/23/2012  . Knee pain 08/21/2012  . Mass of thigh 08/21/2012  . FEVER UNSPECIFIED 05/06/2010  . ORGANIC IMPOTENCE 12/02/2009  . ONYCHOMYCOSIS 07/17/2009  .  Lymphedema 05/06/2009  . ESRD on dialysis ([place]) 06/09/2008  . Hyperlipidemia 03/19/2007  . Class 2 severe obesity due to excess calories with serious comorbidity and body mass index (BMI) of 35.0 to 35.9 in adult ([place]) 02/08/2007  . Essential hypertension 02/08/2007  . Atherosclerosis of native arteries of extremity with intermittent claudication ([place]) 02/08/2007   Past Medical History:  Diagnosis Date  . Arthritis   . Asthma    as a child  . Chronic cystitis   . Chronic diastolic heart failure ([place])   . Chronic kidney disease    HD pt, 3 times a week.  . Diabetes mellitus   . Dysrhythmia   . GERD (gastroesophageal reflux disease)   .  H/O hiatal hernia    states it's been fixed  . Hyperlipidemia   . Hypertension   . Lymphedema   . Morbid obesity ([place])   . NEPHROLITHIASIS, HX OF 12/02/2009   Qualifier: Diagnosis of  By: [name], Cat    . PVD (peripheral vascular disease) ([place])   . Renal insufficiency   . UMBILICAL HERNIA 01/08/7866   Qualifier: History of  By: [name] MD, Audelia Acton    . Venous insufficiency     Family History  Problem Relation Age of Onset  . Diabetes Mother   . Heart disease Mother   . Diabetes Brother     Past Surgical History:  Procedure Laterality Date  . ABDOMINAL AORTOGRAM W/LOWER EXTREMITY N/A 05/19/2017   Procedure: Abdominal Aortogram w/Lower Extremity;  Surgeon: [name], MD;  Location: [facility];  Service: Cardiovascular;  Laterality: N/A;  . AMPUTATION     Right and left fifth toes.   . AMPUTATION TOE     emoval of both little toes  . AV FISTULA PLACEMENT Left 03/21/2014   Procedure: ARTERIOVENOUS (AV) FISTULA CREATION with ultrasound;  Surgeon: [name], MD;  Location: [facility];  Service: Vascular;  Laterality: Left;  . COLONOSCOPY    . EYE SURGERY Bilateral    cataract and lens implant  . HERNIA REPAIR    . LIGATION OF COMPETING BRANCHES OF ARTERIOVENOUS FISTULA Left 06/29/2015   Procedure: LIGATION OF LEFT ARM RADIOCEPHALIC  ARTERIOVENOUS FISTULA SIDE BRANCHES;  Surgeon: Conrad [place], MD;  Location: [facility];  Service: Vascular;  Laterality: Left;  [name] MULTIPLE EXTRACTIONS WITH ALVEOLOPLASTY N/A 04/07/2017   Procedure: Extraction of tooth #'s 1-11, 13, 14,16, 20-23, and 26-28 with alveoloplasty;  Surgeon: Lenn Cal, DDS;  Location: [facility];  Service: Oral Surgery;  Laterality: N/A;  . Popliteal to posterior tibial bypass     2006  . R knee arthoscopic repair of meniscus    . UMBILICAL HERNIA REPAIR     Social History   Occupational History    Employer: DISABLED   Tobacco Use  . Smoking status: Former Smoker    Quit date: 06/06/1979    Years since quitting: 41.8  . Smokeless tobacco: Former Network engineer  . Vaping Use: Never used  Substance and Sexual Activity  . Alcohol use: No    Alcohol/week: 0.0 standard drinks    Comment:   "years ago" , none now  . Drug use: No  . Sexual activity: Never

## 2021-04-19 ENCOUNTER — Ambulatory Visit (INDEPENDENT_AMBULATORY_CARE_PROVIDER_SITE_OTHER): Payer: Medicare Other | Admitting: Physician Assistant

## 2021-04-19 ENCOUNTER — Encounter: Payer: Self-pay | Admitting: Orthopedic Surgery

## 2021-04-19 DIAGNOSIS — I890 Lymphedema, not elsewhere classified: Secondary | ICD-10-CM | POA: Diagnosis not present

## 2021-04-19 NOTE — Progress Notes (Signed)
Office Visit Note   Patient: [name]           Date of Birth: 15-Jan-1951           MRN: [id number] Visit Date: 04/19/2021              Requested by: [name], [name] [place],  [place] [address] PCP: [name], [name]  Chief Complaint  Patient presents with  . Left Leg - Follow-up  . Right Leg - Follow-up      HPI: Patient has a history of bilateral venous stasis disease for which he uses socks.  He also has various ulcers on his toes.  He has been stable however he bumped his great toe on the left over the weekend and wanted to be sure that it was okay  Assessment & Plan: Visit Diagnoses: No diagnosis found.  Plan: We will continue with socks.  Follow-up in 1 week.  No evidence of new infection.  Will wash daily with soap and water  Follow-Up Instructions: No follow-ups on file.   Ortho Exam  Patient is alert, oriented, no adenopathy, well-dressed, normal affect, normal respiratory effort. Chronic venous stasis disease bilateral lower extremities.  He has some abrasion of his great toe but no active bleeding no surrounding cellulitis no deep cuts.  No new ulcers on his legs  Imaging: No results found. No images are attached to the encounter.  Labs: Lab Results  Component Value Date   HGBA1C 6.2 (H) 07/11/2018   HGBA1C 6.8 03/13/2017   HGBA1C 5.7 12/30/2015   ESRSEDRATE 70 (H) 04/04/2015   ESRSEDRATE 81 (H) 07/14/2011   ESRSEDRATE 117 (H) 06/28/2011   CRP 6.1 (H) 04/04/2015   CRP 0.9 (H) 07/14/2011   CRP 4.6 (H) 06/28/2011   LABURIC 9.7 (H) 02/23/2015   REPTSTATUS 09/15/2018 FINAL 09/10/2018   GRAMSTAIN  06/24/2011    RARE WBC PRESENT,BOTH PMN AND MONONUCLEAR NO ORGANISMS SEEN   GRAMSTAIN  06/24/2011    RARE WBC PRESENT,BOTH PMN AND MONONUCLEAR NO ORGANISMS SEEN   CULT  09/10/2018    NO GROWTH 5 DAYS Performed at [facility], [place] [address], [place], [place] [address]    LABORGA [facility] MORGANII 08/21/2015      Lab Results  Component Value Date   ALBUMIN 2.7 (L) 09/12/2018   ALBUMIN 3.1 (L) 09/11/2018   ALBUMIN 3.1 (L) 07/10/2018    Lab Results  Component Value Date   MG 2.0 04/13/2017   MG 1.9 03/22/2017   MG 1.7 03/04/2014   No results found for: VD25OH  No results found for: PREALBUMIN CBC EXTENDED Latest Ref Rng & Units 11/11/2020 09/12/2018 09/11/2018  WBC 4.0 - 10.5 K/uL 8.1 7.2 8.1  RBC 4.22 - 5.81 MIL/uL 3.68(L) 3.25(L) 3.46(L)  HGB 13.0 - 17.0 g/dL 10.9(L) 9.9(L) 10.5(L)  HCT 39.0 - 52.0 % 33.8(L) 30.0(L) 32.8(L)  PLT 150 - 400 K/uL 151 92(L) 82(L)  NEUTROABS 1.7 - 7.7 K/uL 5.7 - -  LYMPHSABS 0.7 - 4.0 K/uL 1.6 - -     There is no height or weight on file to calculate BMI.  Orders:  No orders of the defined types were placed in this encounter.  No orders of the defined types were placed in this encounter.    Procedures: No procedures performed  Clinical Data: No additional findings.  ROS:  All other systems negative, except as noted in the HPI. Review of Systems  Objective: Vital  Signs: There were no vitals taken for this visit.  Specialty Comments:  No specialty comments available.  PMFS History: Patient Active Problem List   Diagnosis Date Noted  . Venous stasis   . History of endocarditis   . Bradycardia   . Orthostasis   . Chest pain 09/10/2018  . Hypovolemia associated with hemodialysis 07/11/2018  . Weakness   . Thrombocytopenia ([place]) 07/10/2018  . Paroxysmal atrial fibrillation ([place]) 05/26/2017  . Balanitis 04/25/2017  . Penile swelling   . Scrotal swelling   . UTI (urinary tract infection) 04/13/2017  . Anemia of chronic disease   . Acute encephalopathy 04/12/2017  . Urinary retention 04/10/2017  . Bacteremia   . RUQ abdominal pain   . Gross hematuria   . Other pancytopenia ([place])   . Sepsis ([place]) 03/21/2017  . Diarrhea 03/03/2017  . Retained lens material following cataract surgery of both eyes 11/08/2016  . Venous stasis  ulcers of both lower extremities ([place]) 08/07/2015  . Varicose veins of lower extremities with complications 67/67/2094  . Malfunction of arteriovenous dialysis fistula ([place])   . CHF (congestive heart failure) ([place]) 06/23/2015  . Lymphedema distichiasis syndrome with kidney disease and diabetes mellitus ([place]) 04/06/2015  . Lymphedema of lower extremity 04/04/2015  . Type II diabetes mellitus with nephropathy ([place]) 04/04/2015  . MGUS (monoclonal gammopathy of unknown significance) 06/12/2014  . Chronic diastolic heart failure ([place]) 10/30/2013  . Special screening for malignant neoplasms, colon 07/09/2013  . Proliferative diabetic retinopathy ([place]) 10/23/2012  . Knee pain 08/21/2012  . Mass of thigh 08/21/2012  . FEVER UNSPECIFIED 05/06/2010  . ORGANIC IMPOTENCE 12/02/2009  . ONYCHOMYCOSIS 07/17/2009  . Lymphedema 05/06/2009  . ESRD on dialysis ([place]) 06/09/2008  . Hyperlipidemia 03/19/2007  . Class 2 severe obesity due to excess calories with serious comorbidity and body mass index (BMI) of 35.0 to 35.9 in adult ([place]) 02/08/2007  . Essential hypertension 02/08/2007  . Atherosclerosis of native arteries of extremity with intermittent claudication ([place]) 02/08/2007   Past Medical History:  Diagnosis Date  . Arthritis   . Asthma    as a child  . Chronic cystitis   . Chronic diastolic heart failure ([place])   . Chronic kidney disease    HD pt, 3 times a week.  . Diabetes mellitus   . Dysrhythmia   . GERD (gastroesophageal reflux disease)   . H/O hiatal hernia    states it's been fixed  . Hyperlipidemia   . Hypertension   . Lymphedema   . Morbid obesity ([place])   . NEPHROLITHIASIS, HX OF 12/02/2009   Qualifier: Diagnosis of  By: [name], Cat    . PVD (peripheral vascular disease) ([place])   . Renal insufficiency   . UMBILICAL HERNIA 06/19/6282   Qualifier: History of  By: [name] MD, Audelia Acton    . Venous insufficiency     Family History  Problem Relation Age of Onset  . Diabetes Mother    . Heart disease Mother   . Diabetes Brother     Past Surgical History:  Procedure Laterality Date  . ABDOMINAL AORTOGRAM W/LOWER EXTREMITY N/A 05/19/2017   Procedure: Abdominal Aortogram w/Lower Extremity;  Surgeon: [name], MD;  Location: [facility];  Service: Cardiovascular;  Laterality: N/A;  . AMPUTATION     Right and left fifth toes.   . AMPUTATION TOE     emoval of both little toes  . AV FISTULA PLACEMENT Left 03/21/2014   Procedure: ARTERIOVENOUS (AV) FISTULA CREATION  with ultrasound;  Surgeon: [name], MD;  Location: [facility];  Service: Vascular;  Laterality: Left;  . COLONOSCOPY    . EYE SURGERY Bilateral    cataract and lens implant  . HERNIA REPAIR    . LIGATION OF COMPETING BRANCHES OF ARTERIOVENOUS FISTULA Left 06/29/2015   Procedure: LIGATION OF LEFT ARM RADIOCEPHALIC ARTERIOVENOUS FISTULA SIDE BRANCHES;  Surgeon: Conrad Loveland, MD;  Location: [facility];  Service: Vascular;  Laterality: Left;  [name] MULTIPLE EXTRACTIONS WITH ALVEOLOPLASTY N/A 04/07/2017   Procedure: Extraction of tooth #'s 1-11, 13, 14,16, 20-23, and 26-28 with alveoloplasty;  Surgeon: Lenn Cal, DDS;  Location: [facility];  Service: Oral Surgery;  Laterality: N/A;  . Popliteal to posterior tibial bypass     2006  . R knee arthoscopic repair of meniscus    . UMBILICAL HERNIA REPAIR     Social History   Occupational History    Employer: DISABLED   Tobacco Use  . Smoking status: Former Smoker    Quit date: 06/06/1979    Years since quitting: 41.8  . Smokeless tobacco: Former Network engineer  . Vaping Use: Never used  Substance and Sexual Activity  . Alcohol use: No    Alcohol/week: 0.0 standard drinks    Comment:   "years ago" , none now  . Drug use: No  . Sexual activity: Never

## 2021-04-26 ENCOUNTER — Ambulatory Visit (INDEPENDENT_AMBULATORY_CARE_PROVIDER_SITE_OTHER): Payer: Medicare Other | Admitting: Physician Assistant

## 2021-04-26 ENCOUNTER — Encounter: Payer: Self-pay | Admitting: Physician Assistant

## 2021-04-26 DIAGNOSIS — I5032 Chronic diastolic (congestive) heart failure: Secondary | ICD-10-CM | POA: Diagnosis not present

## 2021-04-26 NOTE — Progress Notes (Signed)
Office Visit Note   Patient: [name]           Date of Birth: 1951-06-04           MRN: [id number] Visit Date: 04/26/2021              Requested by: [name], [name] [place],  [place] [address] PCP: [name], [name]  Chief Complaint  Patient presents with  . Left Leg - Follow-up  . Right Leg - Follow-up      HPI: Patient presents today for follow-up of his bilateral lower venous insufficiency.  He also has chronic bilateral wounds of his great toes.  He has been using a medical compression sock  Assessment & Plan: Visit Diagnoses: No diagnosis found.  Plan: Continue with compression socks I reminded him to wash his feet daily with antibacterial soap and water which he admits he has not been doing.  He should follow-up in 2 weeks sooner if he has any concerns  Follow-Up Instructions: No follow-ups on file.   Ortho Exam  Patient is alert, oriented, no adenopathy, well-dressed, normal affect, normal respiratory effort. Bilateral lower extremity venous insufficiency legs do not have any open ulcers on them.  He does have a maceration over his left great toe.  There is no exposed bone does not probe deeply to bone.  No ascending cellulitis.  On the right he has some dry areas as well but again no ascending cellulitis no exposed bone  Imaging: No results found. No images are attached to the encounter.  Labs: Lab Results  Component Value Date   HGBA1C 6.2 (H) 07/11/2018   HGBA1C 6.8 03/13/2017   HGBA1C 5.7 12/30/2015   ESRSEDRATE 70 (H) 04/04/2015   ESRSEDRATE 81 (H) 07/14/2011   ESRSEDRATE 117 (H) 06/28/2011   CRP 6.1 (H) 04/04/2015   CRP 0.9 (H) 07/14/2011   CRP 4.6 (H) 06/28/2011   LABURIC 9.7 (H) 02/23/2015   REPTSTATUS 09/15/2018 FINAL 09/10/2018   GRAMSTAIN  06/24/2011    RARE WBC PRESENT,BOTH PMN AND MONONUCLEAR NO ORGANISMS SEEN   GRAMSTAIN  06/24/2011    RARE WBC PRESENT,BOTH PMN AND MONONUCLEAR NO ORGANISMS SEEN   CULT   09/10/2018    NO GROWTH 5 DAYS Performed at [facility], [place] [address] [place], [place] [address]    LABORGA [facility] MORGANII 08/21/2015     Lab Results  Component Value Date   ALBUMIN 2.7 (L) 09/12/2018   ALBUMIN 3.1 (L) 09/11/2018   ALBUMIN 3.1 (L) 07/10/2018    Lab Results  Component Value Date   MG 2.0 04/13/2017   MG 1.9 03/22/2017   MG 1.7 03/04/2014   No results found for: VD25OH  No results found for: PREALBUMIN CBC EXTENDED Latest Ref Rng & Units 11/11/2020 09/12/2018 09/11/2018  WBC 4.0 - 10.5 K/uL 8.1 7.2 8.1  RBC 4.22 - 5.81 MIL/uL 3.68(L) 3.25(L) 3.46(L)  HGB 13.0 - 17.0 g/dL 10.9(L) 9.9(L) 10.5(L)  HCT 39.0 - 52.0 % 33.8(L) 30.0(L) 32.8(L)  PLT 150 - 400 K/uL 151 92(L) 82(L)  NEUTROABS 1.7 - 7.7 K/uL 5.7 - -  LYMPHSABS 0.7 - 4.0 K/uL 1.6 - -     There is no height or weight on file to calculate BMI.  Orders:  No orders of the defined types were placed in this encounter.  No orders of the defined types were placed in this encounter.    Procedures: No procedures performed  Clinical  Data: No additional findings.  ROS:  All other systems negative, except as noted in the HPI. Review of Systems  Objective: Vital Signs: There were no vitals taken for this visit.  Specialty Comments:  No specialty comments available.  PMFS History: Patient Active Problem List   Diagnosis Date Noted  . Venous stasis   . History of endocarditis   . Bradycardia   . Orthostasis   . Chest pain 09/10/2018  . Hypovolemia associated with hemodialysis 07/11/2018  . Weakness   . Thrombocytopenia ([place]) 07/10/2018  . Paroxysmal atrial fibrillation ([place]) 05/26/2017  . Balanitis 04/25/2017  . Penile swelling   . Scrotal swelling   . UTI (urinary tract infection) 04/13/2017  . Anemia of chronic disease   . Acute encephalopathy 04/12/2017  . Urinary retention 04/10/2017  . Bacteremia   . RUQ abdominal pain   . Gross hematuria   . Other pancytopenia  ([place])   . Sepsis ([place]) 03/21/2017  . Diarrhea 03/03/2017  . Retained lens material following cataract surgery of both eyes 11/08/2016  . Venous stasis ulcers of both lower extremities ([place]) 08/07/2015  . Varicose veins of lower extremities with complications 35/57/3220  . Malfunction of arteriovenous dialysis fistula ([place])   . CHF (congestive heart failure) ([place]) 06/23/2015  . Lymphedema distichiasis syndrome with kidney disease and diabetes mellitus ([place]) 04/06/2015  . Lymphedema of lower extremity 04/04/2015  . Type II diabetes mellitus with nephropathy ([place]) 04/04/2015  . MGUS (monoclonal gammopathy of unknown significance) 06/12/2014  . Chronic diastolic heart failure ([place]) 10/30/2013  . Special screening for malignant neoplasms, colon 07/09/2013  . Proliferative diabetic retinopathy ([place]) 10/23/2012  . Knee pain 08/21/2012  . Mass of thigh 08/21/2012  . FEVER UNSPECIFIED 05/06/2010  . ORGANIC IMPOTENCE 12/02/2009  . ONYCHOMYCOSIS 07/17/2009  . Lymphedema 05/06/2009  . ESRD on dialysis ([place]) 06/09/2008  . Hyperlipidemia 03/19/2007  . Class 2 severe obesity due to excess calories with serious comorbidity and body mass index (BMI) of 35.0 to 35.9 in adult ([place]) 02/08/2007  . Essential hypertension 02/08/2007  . Atherosclerosis of native arteries of extremity with intermittent claudication ([place]) 02/08/2007   Past Medical History:  Diagnosis Date  . Arthritis   . Asthma    as a child  . Chronic cystitis   . Chronic diastolic heart failure ([place])   . Chronic kidney disease    HD pt, 3 times a week.  . Diabetes mellitus   . Dysrhythmia   . GERD (gastroesophageal reflux disease)   . H/O hiatal hernia    states it's been fixed  . Hyperlipidemia   . Hypertension   . Lymphedema   . Morbid obesity ([place])   . NEPHROLITHIASIS, HX OF 12/02/2009   Qualifier: Diagnosis of  By: [name], Cat    . PVD (peripheral vascular disease) ([place])   . Renal insufficiency   . UMBILICAL HERNIA  2/54/2706   Qualifier: History of  By: [name] MD, Audelia Acton    . Venous insufficiency     Family History  Problem Relation Age of Onset  . Diabetes Mother   . Heart disease Mother   . Diabetes Brother     Past Surgical History:  Procedure Laterality Date  . ABDOMINAL AORTOGRAM W/LOWER EXTREMITY N/A 05/19/2017   Procedure: Abdominal Aortogram w/Lower Extremity;  Surgeon: [name], MD;  Location: [facility];  Service: Cardiovascular;  Laterality: N/A;  . AMPUTATION     Right and left fifth toes.   . AMPUTATION TOE  emoval of both little toes  . AV FISTULA PLACEMENT Left 03/21/2014   Procedure: ARTERIOVENOUS (AV) FISTULA CREATION with ultrasound;  Surgeon: [name], MD;  Location: [facility];  Service: Vascular;  Laterality: Left;  . COLONOSCOPY    . EYE SURGERY Bilateral    cataract and lens implant  . HERNIA REPAIR    . LIGATION OF COMPETING BRANCHES OF ARTERIOVENOUS FISTULA Left 06/29/2015   Procedure: LIGATION OF LEFT ARM RADIOCEPHALIC ARTERIOVENOUS FISTULA SIDE BRANCHES;  Surgeon: Conrad Warsaw, MD;  Location: [facility];  Service: Vascular;  Laterality: Left;  [name] MULTIPLE EXTRACTIONS WITH ALVEOLOPLASTY N/A 04/07/2017   Procedure: Extraction of tooth #'s 1-11, 13, 14,16, 20-23, and 26-28 with alveoloplasty;  Surgeon: Lenn Cal, DDS;  Location: [facility];  Service: Oral Surgery;  Laterality: N/A;  . Popliteal to posterior tibial bypass     2006  . R knee arthoscopic repair of meniscus    . UMBILICAL HERNIA REPAIR     Social History   Occupational History    Employer: DISABLED   Tobacco Use  . Smoking status: Former Smoker    Quit date: 06/06/1979    Years since quitting: 41.9  . Smokeless tobacco: Former Network engineer  . Vaping Use: Never used  Substance and Sexual Activity  . Alcohol use: No    Alcohol/week: 0.0 standard drinks    Comment:   "years ago" , none now  . Drug use: No  . Sexual activity: Never

## 2021-05-12 ENCOUNTER — Other Ambulatory Visit: Payer: Self-pay

## 2021-05-12 ENCOUNTER — Ambulatory Visit (INDEPENDENT_AMBULATORY_CARE_PROVIDER_SITE_OTHER): Payer: Medicare Other | Admitting: Physician Assistant

## 2021-05-12 ENCOUNTER — Encounter: Payer: Self-pay | Admitting: Physician Assistant

## 2021-05-12 DIAGNOSIS — L97919 Non-pressure chronic ulcer of unspecified part of right lower leg with unspecified severity: Secondary | ICD-10-CM | POA: Diagnosis not present

## 2021-05-12 DIAGNOSIS — I87333 Chronic venous hypertension (idiopathic) with ulcer and inflammation of bilateral lower extremity: Secondary | ICD-10-CM | POA: Diagnosis not present

## 2021-05-12 NOTE — Progress Notes (Signed)
Office Visit Note   Patient: [name]           Date of Birth: [birth year]           MRN: [id number] Visit Date: 05/12/2021              Requested by: [name], [name] [place],  [place] [address] PCP: [name], [name]  No chief complaint on file.     HPI: Patient is a 70 year old gentleman who comes in today for follow-up of his chronic venous stasis on his bilateral lower extremities.  He does have his own preferred compression socks.  At his last visit 2 weeks ago he was transition to these and told to change some daily and wash his legs with antibacterial soap and water.  He also has a wound on his left great toe.  He states he has only changed his thoughts a total of 2 times.  He has noticed he has gotten more swelling in the right lower extremity.  Denies any particular pain  Assessment & Plan: Visit Diagnoses: No diagnosis found.  Plan: Emphasized the importance of changing the sock on the left side daily as well as cleansing the leg and toe with antibacterial soap and water.  He is at high risk for amputation.  On the right side we will apply a Dynaflex wrap follow-up in 1 week  Follow-Up Instructions: No follow-ups on file.   Ortho Exam  Patient is alert, oriented, no adenopathy, well-dressed, normal affect, normal respiratory effort. Right lower extremity significant lower extremity edema no ascending cellulitis significant skin sloughing.  He has indentation around his ankle where he had been wearing his sock without changing it.  No signs of acute infection.  On the left side swelling is significantly less than the right.  He does have an open ulcer over the great toe this does not probe to bone was debrided of macerated tissue from wearing the sock for 2 long.  Beneath this was bleeding tissue which was touched with a silver nitrate stick there is no ascending cellulitis  Imaging: No results found. No images are attached to the  encounter.  Labs: Lab Results  Component Value Date   HGBA1C 6.2 (H) 07/11/2018   HGBA1C 6.8 03/13/2017   HGBA1C 5.7 12/30/2015   ESRSEDRATE 70 (H) 04/04/2015   ESRSEDRATE 81 (H) 07/14/2011   ESRSEDRATE 117 (H) 06/28/2011   CRP 6.1 (H) 04/04/2015   CRP 0.9 (H) 07/14/2011   CRP 4.6 (H) 06/28/2011   LABURIC 9.7 (H) 02/23/2015   REPTSTATUS 09/15/2018 FINAL 09/10/2018   GRAMSTAIN  06/24/2011    RARE WBC PRESENT,BOTH PMN AND MONONUCLEAR NO ORGANISMS SEEN   GRAMSTAIN  06/24/2011    RARE WBC PRESENT,BOTH PMN AND MONONUCLEAR NO ORGANISMS SEEN   CULT  09/10/2018    NO GROWTH 5 DAYS Performed at [facility], [place] [address] [place], [place] [address]    LABORGA [facility] MORGANII 08/21/2015     Lab Results  Component Value Date   ALBUMIN 2.7 (L) 09/12/2018   ALBUMIN 3.1 (L) 09/11/2018   ALBUMIN 3.1 (L) 07/10/2018    Lab Results  Component Value Date   MG 2.0 04/13/2017   MG 1.9 03/22/2017   MG 1.7 03/04/2014   No results found for: VD25OH  No results found for: PREALBUMIN CBC EXTENDED Latest Ref Rng & Units 11/11/2020 09/12/2018 09/11/2018  WBC 4.0 - 10.5 K/uL 8.1 7.2 8.1  RBC 4.22 - 5.81 MIL/uL 3.68(L) 3.25(L) 3.46(L)  HGB 13.0 - 17.0 g/dL 10.9(L) 9.9(L) 10.5(L)  HCT 39.0 - 52.0 % 33.8(L) 30.0(L) 32.8(L)  PLT 150 - 400 K/uL 151 92(L) 82(L)  NEUTROABS 1.7 - 7.7 K/uL 5.7 - -  LYMPHSABS 0.7 - 4.0 K/uL 1.6 - -     There is no height or weight on file to calculate BMI.  Orders:  No orders of the defined types were placed in this encounter.  No orders of the defined types were placed in this encounter.    Procedures: No procedures performed  Clinical Data: No additional findings.  ROS:  All other systems negative, except as noted in the HPI. Review of Systems  Objective: Vital Signs: There were no vitals taken for this visit.  Specialty Comments:  No specialty comments available.  PMFS History: Patient Active Problem List   Diagnosis Date  Noted  . Venous stasis   . History of endocarditis   . Bradycardia   . Orthostasis   . Chest pain 09/10/2018  . Hypovolemia associated with hemodialysis 07/11/2018  . Weakness   . Thrombocytopenia ([place]) 07/10/2018  . Paroxysmal atrial fibrillation ([place]) 05/26/2017  . Balanitis 04/25/2017  . Penile swelling   . Scrotal swelling   . UTI (urinary tract infection) 04/13/2017  . Anemia of chronic disease   . Acute encephalopathy 04/12/2017  . Urinary retention 04/10/2017  . Bacteremia   . RUQ abdominal pain   . Gross hematuria   . Other pancytopenia ([place])   . Sepsis ([place]) 03/21/2017  . Diarrhea 03/03/2017  . Retained lens material following cataract surgery of both eyes 11/08/2016  . Venous stasis ulcers of both lower extremities ([place]) 08/07/2015  . Varicose veins of lower extremities with complications 52/77/8242  . Malfunction of arteriovenous dialysis fistula ([place])   . CHF (congestive heart failure) ([place]) 06/23/2015  . Lymphedema distichiasis syndrome with kidney disease and diabetes mellitus ([place]) 04/06/2015  . Lymphedema of lower extremity 04/04/2015  . Type II diabetes mellitus with nephropathy ([place]) 04/04/2015  . MGUS (monoclonal gammopathy of unknown significance) 06/12/2014  . Chronic diastolic heart failure ([place]) 10/30/2013  . Special screening for malignant neoplasms, colon 07/09/2013  . Proliferative diabetic retinopathy ([place]) 10/23/2012  . Knee pain 08/21/2012  . Mass of thigh 08/21/2012  . FEVER UNSPECIFIED 05/06/2010  . ORGANIC IMPOTENCE 12/02/2009  . ONYCHOMYCOSIS 07/17/2009  . Lymphedema 05/06/2009  . ESRD on dialysis ([place]) 06/09/2008  . Hyperlipidemia 03/19/2007  . Class 2 severe obesity due to excess calories with serious comorbidity and body mass index (BMI) of 35.0 to 35.9 in adult ([place]) 02/08/2007  . Essential hypertension 02/08/2007  . Atherosclerosis of native arteries of extremity with intermittent claudication ([place]) 02/08/2007   Past Medical  History:  Diagnosis Date  . Arthritis   . Asthma    as a child  . Chronic cystitis   . Chronic diastolic heart failure ([place])   . Chronic kidney disease    HD pt, 3 times a week.  . Diabetes mellitus   . Dysrhythmia   . GERD (gastroesophageal reflux disease)   . H/O hiatal hernia    states it's been fixed  . Hyperlipidemia   . Hypertension   . Lymphedema   . Morbid obesity ([place])   . NEPHROLITHIASIS, HX OF 12/02/2009   Qualifier: Diagnosis of  By: [name], Cat    . PVD (peripheral vascular disease) ([place])   . Renal insufficiency   . UMBILICAL HERNIA 3/53/6144  Qualifier: History of  By: [name] MD, Audelia Acton    . Venous insufficiency     Family History  Problem Relation Age of Onset  . Diabetes Mother   . Heart disease Mother   . Diabetes Brother     Past Surgical History:  Procedure Laterality Date  . ABDOMINAL AORTOGRAM W/LOWER EXTREMITY N/A 05/19/2017   Procedure: Abdominal Aortogram w/Lower Extremity;  Surgeon: [name], MD;  Location: [facility];  Service: Cardiovascular;  Laterality: N/A;  . AMPUTATION     Right and left fifth toes.   . AMPUTATION TOE     emoval of both little toes  . AV FISTULA PLACEMENT Left 03/21/2014   Procedure: ARTERIOVENOUS (AV) FISTULA CREATION with ultrasound;  Surgeon: [name], MD;  Location: [facility];  Service: Vascular;  Laterality: Left;  . COLONOSCOPY    . EYE SURGERY Bilateral    cataract and lens implant  . HERNIA REPAIR    . LIGATION OF COMPETING BRANCHES OF ARTERIOVENOUS FISTULA Left 06/29/2015   Procedure: LIGATION OF LEFT ARM RADIOCEPHALIC ARTERIOVENOUS FISTULA SIDE BRANCHES;  Surgeon: Conrad Lemitar, MD;  Location: [facility];  Service: Vascular;  Laterality: Left;  [name] MULTIPLE EXTRACTIONS WITH ALVEOLOPLASTY N/A 04/07/2017   Procedure: Extraction of tooth #'s 1-11, 13, 14,16, 20-23, and 26-28 with alveoloplasty;  Surgeon: Lenn Cal, DDS;  Location: [facility] [facility];  Service: Oral Surgery;  Laterality: N/A;  . Popliteal to  posterior tibial bypass     2006  . R knee arthoscopic repair of meniscus    . UMBILICAL HERNIA REPAIR     Social History   Occupational History    Employer: DISABLED   Tobacco Use  . Smoking status: Former Smoker    Quit date: 06/06/1979    Years since quitting: 41.9  . Smokeless tobacco: Former Network engineer  . Vaping Use: Never used  Substance and Sexual Activity  . Alcohol use: No    Alcohol/week: 0.0 standard drinks    Comment:   "years ago" , none now  . Drug use: No  . Sexual activity: Never

## 2021-05-19 ENCOUNTER — Other Ambulatory Visit: Payer: Self-pay

## 2021-05-19 ENCOUNTER — Ambulatory Visit (INDEPENDENT_AMBULATORY_CARE_PROVIDER_SITE_OTHER): Payer: Medicare Other | Admitting: Physician Assistant

## 2021-05-19 ENCOUNTER — Encounter: Payer: Self-pay | Admitting: Physician Assistant

## 2021-05-19 DIAGNOSIS — I878 Other specified disorders of veins: Secondary | ICD-10-CM

## 2021-05-19 NOTE — Progress Notes (Signed)
Office Visit Note   Patient: [name]           Date of Birth: Jan 13, 1951           MRN: [id number] Visit Date: 05/19/2021              Requested by: [name], [name] [place],  [name] [id number] PCP: [name], [name]  Chief Complaint  Patient presents with  . Right Leg - Follow-up  . Left Leg - Follow-up      HPI: Patient is a pleasant 70 year old gentleman who comes in today in follow-up for his bilateral venous stasis disease.  On the left side he has been wearing a compression sock.  On the right side he was wearing a Profore dressing.  He has no complaints he does say he has been changing his sock on the left as was instructed  Assessment & Plan: Visit Diagnoses: No diagnosis found.  Plan: Explained to the patient the importance of placing the sock and changing both of them daily and washing his legs. Follow-up in 2 weeks Follow-Up Instructions: No follow-ups on file.   Ortho Exam  Patient is alert, oriented, no adenopathy, well-dressed, normal affect, normal respiratory effort. Left lower extremity skin is in better condition no cellulitis no signs of infection he does have a ulcer at the tip of his great toe.  There is some surrounding skin maceration.  No cellulitis or foul odor.  Did not probe deeply to bone or have any tunneling.  With debridement did have healthy exudative wound base.  Right lower extremity significant decrease in swelling there is wrinkling of the skin no open ulcers or sores no ascending cellulitis or signs of infection  Imaging: No results found. No images are attached to the encounter.  Labs: Lab Results  Component Value Date   HGBA1C 6.2 (H) 07/11/2018   HGBA1C 6.8 03/13/2017   HGBA1C 5.7 12/30/2015   ESRSEDRATE 70 (H) 04/04/2015   ESRSEDRATE 81 (H) 07/14/2011   ESRSEDRATE 117 (H) 06/28/2011   CRP 6.1 (H) 04/04/2015   CRP 0.9 (H) 07/14/2011   CRP 4.6 (H) 06/28/2011   LABURIC 9.7 (H) 02/23/2015    REPTSTATUS 09/15/2018 FINAL 09/10/2018   GRAMSTAIN  06/24/2011    RARE WBC PRESENT,BOTH PMN AND MONONUCLEAR NO ORGANISMS SEEN   GRAMSTAIN  06/24/2011    RARE WBC PRESENT,BOTH PMN AND MONONUCLEAR NO ORGANISMS SEEN   CULT  09/10/2018    NO GROWTH 5 DAYS Performed at [facility], [place] [address], [place], [place] [address]    LABORGA [facility] MORGANII 08/21/2015     Lab Results  Component Value Date   ALBUMIN 2.7 (L) 09/12/2018   ALBUMIN 3.1 (L) 09/11/2018   ALBUMIN 3.1 (L) 07/10/2018    Lab Results  Component Value Date   MG 2.0 04/13/2017   MG 1.9 03/22/2017   MG 1.7 03/04/2014   No results found for: VD25OH  No results found for: PREALBUMIN CBC EXTENDED Latest Ref Rng & Units 11/11/2020 09/12/2018 09/11/2018  WBC 4.0 - 10.5 K/uL 8.1 7.2 8.1  RBC 4.22 - 5.81 MIL/uL 3.68(L) 3.25(L) 3.46(L)  HGB 13.0 - 17.0 g/dL 10.9(L) 9.9(L) 10.5(L)  HCT 39.0 - 52.0 % 33.8(L) 30.0(L) 32.8(L)  PLT 150 - 400 K/uL 151 92(L) 82(L)  NEUTROABS 1.7 - 7.7 K/uL 5.7 - -  LYMPHSABS 0.7 - 4.0 K/uL 1.6 - -     There is no height or weight  on file to calculate BMI.  Orders:  No orders of the defined types were placed in this encounter.  No orders of the defined types were placed in this encounter.    Procedures: No procedures performed  Clinical Data: No additional findings.  ROS:  All other systems negative, except as noted in the HPI. Review of Systems  Objective: Vital Signs: There were no vitals taken for this visit.  Specialty Comments:  No specialty comments available.  PMFS History: Patient Active Problem List   Diagnosis Date Noted  . Venous stasis   . History of endocarditis   . Bradycardia   . Orthostasis   . Chest pain 09/10/2018  . Hypovolemia associated with hemodialysis 07/11/2018  . Weakness   . Thrombocytopenia ([place]) 07/10/2018  . Paroxysmal atrial fibrillation ([place]) 05/26/2017  . Balanitis 04/25/2017  . Penile swelling   . Scrotal swelling   .  UTI (urinary tract infection) 04/13/2017  . Anemia of chronic disease   . Acute encephalopathy 04/12/2017  . Urinary retention 04/10/2017  . Bacteremia   . RUQ abdominal pain   . Gross hematuria   . Other pancytopenia ([place])   . Sepsis ([place]) 03/21/2017  . Diarrhea 03/03/2017  . Retained lens material following cataract surgery of both eyes 11/08/2016  . Venous stasis ulcers of both lower extremities ([place]) 08/07/2015  . Varicose veins of lower extremities with complications 95/28/4132  . Malfunction of arteriovenous dialysis fistula ([place])   . CHF (congestive heart failure) ([place]) 06/23/2015  . Lymphedema distichiasis syndrome with kidney disease and diabetes mellitus ([place]) 04/06/2015  . Lymphedema of lower extremity 04/04/2015  . Type II diabetes mellitus with nephropathy ([place]) 04/04/2015  . MGUS (monoclonal gammopathy of unknown significance) 06/12/2014  . Chronic diastolic heart failure ([place]) 10/30/2013  . Special screening for malignant neoplasms, colon 07/09/2013  . Proliferative diabetic retinopathy ([place]) 10/23/2012  . Knee pain 08/21/2012  . Mass of thigh 08/21/2012  . FEVER UNSPECIFIED 05/06/2010  . ORGANIC IMPOTENCE 12/02/2009  . ONYCHOMYCOSIS 07/17/2009  . Lymphedema 05/06/2009  . ESRD on dialysis ([place]) 06/09/2008  . Hyperlipidemia 03/19/2007  . Class 2 severe obesity due to excess calories with serious comorbidity and body mass index (BMI) of 35.0 to 35.9 in adult ([place]) 02/08/2007  . Essential hypertension 02/08/2007  . Atherosclerosis of native arteries of extremity with intermittent claudication ([place]) 02/08/2007   Past Medical History:  Diagnosis Date  . Arthritis   . Asthma    as a child  . Chronic cystitis   . Chronic diastolic heart failure ([place])   . Chronic kidney disease    HD pt, 3 times a week.  . Diabetes mellitus   . Dysrhythmia   . GERD (gastroesophageal reflux disease)   . H/O hiatal hernia    states it's been fixed  . Hyperlipidemia   .  Hypertension   . Lymphedema   . Morbid obesity ([place])   . NEPHROLITHIASIS, HX OF 12/02/2009   Qualifier: Diagnosis of  By: [name], Cat    . PVD (peripheral vascular disease) ([place])   . Renal insufficiency   . UMBILICAL HERNIA 4/40/1027   Qualifier: History of  By: [name] MD, Audelia Acton    . Venous insufficiency     Family History  Problem Relation Age of Onset  . Diabetes Mother   . Heart disease Mother   . Diabetes Brother     Past Surgical History:  Procedure Laterality Date  . ABDOMINAL AORTOGRAM W/LOWER EXTREMITY N/A 05/19/2017  Procedure: Abdominal Aortogram w/Lower Extremity;  Surgeon: [name], MD;  Location: [facility];  Service: Cardiovascular;  Laterality: N/A;  . AMPUTATION     Right and left fifth toes.   . AMPUTATION TOE     emoval of both little toes  . AV FISTULA PLACEMENT Left 03/21/2014   Procedure: ARTERIOVENOUS (AV) FISTULA CREATION with ultrasound;  Surgeon: [name], MD;  Location: [facility];  Service: Vascular;  Laterality: Left;  . COLONOSCOPY    . EYE SURGERY Bilateral    cataract and lens implant  . HERNIA REPAIR    . LIGATION OF COMPETING BRANCHES OF ARTERIOVENOUS FISTULA Left 06/29/2015   Procedure: LIGATION OF LEFT ARM RADIOCEPHALIC ARTERIOVENOUS FISTULA SIDE BRANCHES;  Surgeon: Conrad Milford, MD;  Location: [facility];  Service: Vascular;  Laterality: Left;  [name] MULTIPLE EXTRACTIONS WITH ALVEOLOPLASTY N/A 04/07/2017   Procedure: Extraction of tooth #'s 1-11, 13, 14,16, 20-23, and 26-28 with alveoloplasty;  Surgeon: Lenn Cal, DDS;  Location: [facility];  Service: Oral Surgery;  Laterality: N/A;  . Popliteal to posterior tibial bypass     2006  . R knee arthoscopic repair of meniscus    . UMBILICAL HERNIA REPAIR     Social History   Occupational History    Employer: DISABLED   Tobacco Use  . Smoking status: Former Smoker    Quit date: 06/06/1979    Years since quitting: 41.9  . Smokeless tobacco: Former Network engineer  . Vaping Use:  Never used  Substance and Sexual Activity  . Alcohol use: No    Alcohol/week: 0.0 standard drinks    Comment:   "years ago" , none now  . Drug use: No  . Sexual activity: Never

## 2021-05-31 ENCOUNTER — Other Ambulatory Visit: Payer: Self-pay

## 2021-05-31 ENCOUNTER — Ambulatory Visit (INDEPENDENT_AMBULATORY_CARE_PROVIDER_SITE_OTHER): Payer: Medicare Other | Admitting: Physician Assistant

## 2021-05-31 ENCOUNTER — Encounter: Payer: Self-pay | Admitting: Orthopedic Surgery

## 2021-05-31 DIAGNOSIS — I890 Lymphedema, not elsewhere classified: Secondary | ICD-10-CM

## 2021-05-31 DIAGNOSIS — I878 Other specified disorders of veins: Secondary | ICD-10-CM

## 2021-05-31 DIAGNOSIS — I87333 Chronic venous hypertension (idiopathic) with ulcer and inflammation of bilateral lower extremity: Secondary | ICD-10-CM

## 2021-05-31 NOTE — Progress Notes (Signed)
Office Visit Note   Patient: [name]           Date of Birth: May 05, 1951           MRN: [id number] Visit Date: 05/31/2021              Requested by: [name], [name] [place],  [place] [address] PCP: [name], [name]  Chief Complaint  Patient presents with   Left Leg - Follow-up   Right Leg - Follow-up      HPI: Patient presents today for his venous stasis disease of his bilateral lower extremities.  On the left side he has been wearing his compression sock.  He said he change this Saturday as and has declined me looking at this today and says it is fine.  On the right side his compression sock rolled partly down creating more swelling and indentation.  Assessment & Plan: Visit Diagnoses: No diagnosis found.  Plan: Patient will go into a Profore wrap on the right side.  I did caution him that he is at high risk for amputation if he does not change his socks more regularly.  Again he declined me to look at his left leg today  Follow-Up Instructions: No follow-ups on file.   Ortho Exam  Patient is alert, oriented, no adenopathy, well-dressed, normal affect, normal respiratory effort. Right lower extremity significant swelling.  He has some areas of serous drainage.  There is no ascending cellulitis.  He has moderate plus soft tissue swelling and areas of indentation where his sock was rolled down for a period of time no sign of ascending infection  Imaging: No results found. No images are attached to the encounter.  Labs: Lab Results  Component Value Date   HGBA1C 6.2 (H) 07/11/2018   HGBA1C 6.8 03/13/2017   HGBA1C 5.7 12/30/2015   ESRSEDRATE 70 (H) 04/04/2015   ESRSEDRATE 81 (H) 07/14/2011   ESRSEDRATE 117 (H) 06/28/2011   CRP 6.1 (H) 04/04/2015   CRP 0.9 (H) 07/14/2011   CRP 4.6 (H) 06/28/2011   LABURIC 9.7 (H) 02/23/2015   REPTSTATUS 09/15/2018 FINAL 09/10/2018   GRAMSTAIN  06/24/2011    RARE WBC PRESENT,BOTH PMN AND  MONONUCLEAR NO ORGANISMS SEEN   GRAMSTAIN  06/24/2011    RARE WBC PRESENT,BOTH PMN AND MONONUCLEAR NO ORGANISMS SEEN   CULT  09/10/2018    NO GROWTH 5 DAYS Performed at [facility], [place] [address] [place], [place] [address]    LABORGA [facility] MORGANII 08/21/2015     Lab Results  Component Value Date   ALBUMIN 2.7 (L) 09/12/2018   ALBUMIN 3.1 (L) 09/11/2018   ALBUMIN 3.1 (L) 07/10/2018    Lab Results  Component Value Date   MG 2.0 04/13/2017   MG 1.9 03/22/2017   MG 1.7 03/04/2014   No results found for: VD25OH  No results found for: PREALBUMIN CBC EXTENDED Latest Ref Rng & Units 11/11/2020 09/12/2018 09/11/2018  WBC 4.0 - 10.5 K/uL 8.1 7.2 8.1  RBC 4.22 - 5.81 MIL/uL 3.68(L) 3.25(L) 3.46(L)  HGB 13.0 - 17.0 g/dL 10.9(L) 9.9(L) 10.5(L)  HCT 39.0 - 52.0 % 33.8(L) 30.0(L) 32.8(L)  PLT 150 - 400 K/uL 151 92(L) 82(L)  NEUTROABS 1.7 - 7.7 K/uL 5.7 - -  LYMPHSABS 0.7 - 4.0 K/uL 1.6 - -     There is no height or weight on file to calculate BMI.  Orders:  No orders of the defined types were  placed in this encounter.  No orders of the defined types were placed in this encounter.    Procedures: No procedures performed  Clinical Data: No additional findings.  ROS:  All other systems negative, except as noted in the HPI. Review of Systems  Objective: Vital Signs: There were no vitals taken for this visit.  Specialty Comments:  No specialty comments available.  PMFS History: Patient Active Problem List   Diagnosis Date Noted   Venous stasis    History of endocarditis    Bradycardia    Orthostasis    Chest pain 09/10/2018   Hypovolemia associated with hemodialysis 07/11/2018   Weakness    Thrombocytopenia (HCC) 07/10/2018   Paroxysmal atrial fibrillation ([place]) 05/26/2017   Balanitis 04/25/2017   Penile swelling    Scrotal swelling    UTI (urinary tract infection) 04/13/2017   Anemia of chronic disease    Acute encephalopathy 04/12/2017    Urinary retention 04/10/2017   Bacteremia    RUQ abdominal pain    Gross hematuria    Other pancytopenia ([place])    Sepsis ([place]) 03/21/2017   Diarrhea 03/03/2017   Retained lens material following cataract surgery of both eyes 11/08/2016   Venous stasis ulcers of both lower extremities ([place]) 08/07/2015   Varicose veins of lower extremities with complications 66/05/3015   Malfunction of arteriovenous dialysis fistula (HCC)    CHF (congestive heart failure) ([place]) 06/23/2015   Lymphedema distichiasis syndrome with kidney disease and diabetes mellitus ([place]) 04/06/2015   Lymphedema of lower extremity 04/04/2015   Type II diabetes mellitus with nephropathy ([place]) 04/04/2015   MGUS (monoclonal gammopathy of unknown significance) 06/12/2014   Chronic diastolic heart failure ([place]) 10/30/2013   Special screening for malignant neoplasms, colon 07/09/2013   Proliferative diabetic retinopathy ([place]) 10/23/2012   Knee pain 08/21/2012   Mass of thigh 08/21/2012   FEVER UNSPECIFIED 05/06/2010   ORGANIC IMPOTENCE 12/02/2009   ONYCHOMYCOSIS 07/17/2009   Lymphedema 05/06/2009   ESRD on dialysis ([place]) 06/09/2008   Hyperlipidemia 03/19/2007   Class 2 severe obesity due to excess calories with serious comorbidity and body mass index (BMI) of 35.0 to 35.9 in adult [facility]) 02/08/2007   Essential hypertension 02/08/2007   Atherosclerosis of native arteries of extremity with intermittent claudication ([place]) 02/08/2007   Past Medical History:  Diagnosis Date   Arthritis    Asthma    as a child   Chronic cystitis    Chronic diastolic heart failure ([place])    Chronic kidney disease    HD pt, 3 times a week.   Diabetes mellitus    Dysrhythmia    GERD (gastroesophageal reflux disease)    H/O hiatal hernia    states it's been fixed   Hyperlipidemia    Hypertension    Lymphedema    Morbid obesity ([place])    NEPHROLITHIASIS, HX OF 12/02/2009   Qualifier: Diagnosis of  By: [name], Cat     PVD (peripheral  vascular disease) ([place])    Renal insufficiency    UMBILICAL HERNIA 0/09/9322   Qualifier: History of  By: [name] MD, Shane     Venous insufficiency     Family History  Problem Relation Age of Onset   Diabetes Mother    Heart disease Mother    Diabetes Brother     Past Surgical History:  Procedure Laterality Date   ABDOMINAL AORTOGRAM W/LOWER EXTREMITY N/A 05/19/2017   Procedure: Abdominal Aortogram w/Lower Extremity;  Surgeon: [name], MD;  Location: [facility];  Service: Cardiovascular;  Laterality: N/A;   AMPUTATION     Right and left fifth toes.    AMPUTATION TOE     emoval of both little toes   AV FISTULA PLACEMENT Left 03/21/2014   Procedure: ARTERIOVENOUS (AV) FISTULA CREATION with ultrasound;  Surgeon: [name], MD;  Location: [facility];  Service: Vascular;  Laterality: Left;   COLONOSCOPY     EYE SURGERY Bilateral    cataract and lens implant   HERNIA REPAIR     LIGATION OF COMPETING BRANCHES OF ARTERIOVENOUS FISTULA Left 06/29/2015   Procedure: LIGATION OF LEFT ARM RADIOCEPHALIC ARTERIOVENOUS FISTULA SIDE BRANCHES;  Surgeon: Conrad Jeanerette, MD;  Location: [facility];  Service: Vascular;  Laterality: Left;   MULTIPLE EXTRACTIONS WITH ALVEOLOPLASTY N/A 04/07/2017   Procedure: Extraction of tooth #'s 1-11, 13, 14,16, 20-23, and 26-28 with alveoloplasty;  Surgeon: Lenn Cal, DDS;  Location: [facility];  Service: Oral Surgery;  Laterality: N/A;   Popliteal to posterior tibial bypass     2006   R knee arthoscopic repair of meniscus     UMBILICAL HERNIA REPAIR     Social History   Occupational History    Employer: DISABLED   Tobacco Use   Smoking status: Former    Pack years: 0.00    Types: Cigarettes    Quit date: 06/06/1979    Years since quitting: 42.0   Smokeless tobacco: Former  Scientific laboratory technician Use: Never used  Substance and Sexual Activity   Alcohol use: No    Alcohol/week: 0.0 standard drinks    Comment:   "years ago" , none now   Drug use: No    Sexual activity: Never

## 2021-06-02 ENCOUNTER — Ambulatory Visit: Payer: Medicare Other | Admitting: Physician Assistant

## 2021-06-04 ENCOUNTER — Encounter: Payer: Self-pay | Admitting: Physician Assistant

## 2021-06-04 ENCOUNTER — Ambulatory Visit (INDEPENDENT_AMBULATORY_CARE_PROVIDER_SITE_OTHER): Payer: Medicare Other | Admitting: Physician Assistant

## 2021-06-04 DIAGNOSIS — M79672 Pain in left foot: Secondary | ICD-10-CM

## 2021-06-04 DIAGNOSIS — L97529 Non-pressure chronic ulcer of other part of left foot with unspecified severity: Secondary | ICD-10-CM | POA: Diagnosis not present

## 2021-06-04 DIAGNOSIS — M79671 Pain in right foot: Secondary | ICD-10-CM | POA: Diagnosis not present

## 2021-06-04 DIAGNOSIS — E11621 Type 2 diabetes mellitus with foot ulcer: Secondary | ICD-10-CM

## 2021-06-04 MED ORDER — DOXYCYCLINE HYCLATE 100 MG PO TABS: 100.0000 mg | ORAL_TABLET | Freq: Two times a day (BID) | ORAL | 0 refills | Status: DC

## 2021-06-04 NOTE — Progress Notes (Signed)
Office Visit Note   Patient: [name]           Date of Birth: [birth year]           MRN: [id number] Visit Date: 06/04/2021              Requested by: [name], [name] [place],  [place] [address] PCP: [name], [name]  No chief complaint on file.     HPI: Patient is a 70 year old gentleman with bilateral chronic venous insufficiency and lymphedema.  At his last visit he had significant increase in swelling in his right lower extremity he had not been consistently wearing a medical compression sock.  A Profore dressing was placed.  On the left side he admits he has not been using the medical compression stocking and is using a simple cotton ankle sock.  He has not changed this since yesterday sometime.  He is not consistently washing his foot  Assessment & Plan: Visit Diagnoses: No diagnosis found.  Plan: Discussed again the importance with the patient with regards to washing the left foot every day with antibacterial soap and water.  Really needs to be in his medical compression sock but at the very least needs to be in a clean sock daily if not twice daily if he has a lot of drainage.  He is at high risk for amputation of the great toe or first ray.  I will place him back on doxycycline and he will see me middle of the week next week we will also follow-up with regards to his vascular referral for ABIs  Follow-Up Instructions: No follow-ups on file.   Ortho Exam  Patient is alert, oriented, no adenopathy, well-dressed, normal affect, normal respiratory effort. Right lower extremity he does have wrinkling of the skin with improved appearance.  No open ulcers with the exception of the lateral side of his foot there is 1 area of open blistering where he says he has been putting more pressure on his foot.  This does not probe to bone there is no foul odor no necrosis minimal drainage. Left lower extremity has venous insufficiency changes slightly more swollen  than at last visit.  Significant maceration of the great toe.  This does not probe deeply to bone.  He does have a moderate foul odor on this side from wearing the sock which is saturated no ascending cellulitis on either side  Imaging: No results found. No images are attached to the encounter.  Labs: Lab Results  Component Value Date   HGBA1C 6.2 (H) 07/11/2018   HGBA1C 6.8 03/13/2017   HGBA1C 5.7 12/30/2015   ESRSEDRATE 70 (H) 04/04/2015   ESRSEDRATE 81 (H) 07/14/2011   ESRSEDRATE 117 (H) 06/28/2011   CRP 6.1 (H) 04/04/2015   CRP 0.9 (H) 07/14/2011   CRP 4.6 (H) 06/28/2011   LABURIC 9.7 (H) 02/23/2015   REPTSTATUS 09/15/2018 FINAL 09/10/2018   GRAMSTAIN  06/24/2011    RARE WBC PRESENT,BOTH PMN AND MONONUCLEAR NO ORGANISMS SEEN   GRAMSTAIN  06/24/2011    RARE WBC PRESENT,BOTH PMN AND MONONUCLEAR NO ORGANISMS SEEN   CULT  09/10/2018    NO GROWTH 5 DAYS Performed at [facility], [place] [address], [place], [place] [address]    Doy Hutching [facility] MORGANII 08/21/2015     Lab Results  Component Value Date   ALBUMIN 2.7 (L) 09/12/2018   ALBUMIN 3.1 (L) 09/11/2018   ALBUMIN 3.1 (L) 07/10/2018  Lab Results  Component Value Date   MG 2.0 04/13/2017   MG 1.9 03/22/2017   MG 1.7 03/04/2014   No results found for: VD25OH  No results found for: PREALBUMIN CBC EXTENDED Latest Ref Rng & Units 11/11/2020 09/12/2018 09/11/2018  WBC 4.0 - 10.5 K/uL 8.1 7.2 8.1  RBC 4.22 - 5.81 MIL/uL 3.68(L) 3.25(L) 3.46(L)  HGB 13.0 - 17.0 g/dL 10.9(L) 9.9(L) 10.5(L)  HCT 39.0 - 52.0 % 33.8(L) 30.0(L) 32.8(L)  PLT 150 - 400 K/uL 151 92(L) 82(L)  NEUTROABS 1.7 - 7.7 K/uL 5.7 - -  LYMPHSABS 0.7 - 4.0 K/uL 1.6 - -     There is no height or weight on file to calculate BMI.  Orders:  No orders of the defined types were placed in this encounter.  Meds ordered this encounter  Medications   doxycycline (VIBRA-TABS) 100 MG tablet    Sig: Take 1 tablet (100 mg total) by mouth 2 (two)  times daily.    Dispense:  60 tablet    Refill:  0     Procedures: No procedures performed  Clinical Data: No additional findings.  ROS:  All other systems negative, except as noted in the HPI. Review of Systems  Objective: Vital Signs: There were no vitals taken for this visit.  Specialty Comments:  No specialty comments available.  PMFS History: Patient Active Problem List   Diagnosis Date Noted   Venous stasis    History of endocarditis    Bradycardia    Orthostasis    Chest pain 09/10/2018   Hypovolemia associated with hemodialysis 07/11/2018   Weakness    Thrombocytopenia (HCC) 07/10/2018   Paroxysmal atrial fibrillation ([place]) 05/26/2017   Balanitis 04/25/2017   Penile swelling    Scrotal swelling    UTI (urinary tract infection) 04/13/2017   Anemia of chronic disease    Acute encephalopathy 04/12/2017   Urinary retention 04/10/2017   Bacteremia    RUQ abdominal pain    Gross hematuria    Other pancytopenia ([place])    Sepsis ([place]) 03/21/2017   Diarrhea 03/03/2017   Retained lens material following cataract surgery of both eyes 11/08/2016   Venous stasis ulcers of both lower extremities ([place]) 08/07/2015   Varicose veins of lower extremities with complications 29/52/8413   Malfunction of arteriovenous dialysis fistula (HCC)    CHF (congestive heart failure) ([place]) 06/23/2015   Lymphedema distichiasis syndrome with kidney disease and diabetes mellitus ([place]) 04/06/2015   Lymphedema of lower extremity 04/04/2015   Type II diabetes mellitus with nephropathy ([place]) 04/04/2015   MGUS (monoclonal gammopathy of unknown significance) 06/12/2014   Chronic diastolic heart failure ([place]) 10/30/2013   Special screening for malignant neoplasms, colon 07/09/2013   Proliferative diabetic retinopathy ([place]) 10/23/2012   Knee pain 08/21/2012   Mass of thigh 08/21/2012   FEVER UNSPECIFIED 05/06/2010   ORGANIC IMPOTENCE 12/02/2009   ONYCHOMYCOSIS 07/17/2009   Lymphedema  05/06/2009   ESRD on dialysis ([place]) 06/09/2008   Hyperlipidemia 03/19/2007   Class 2 severe obesity due to excess calories with serious comorbidity and body mass index (BMI) of 35.0 to 35.9 in adult [facility]) 02/08/2007   Essential hypertension 02/08/2007   Atherosclerosis of native arteries of extremity with intermittent claudication ([place]) 02/08/2007   Past Medical History:  Diagnosis Date   Arthritis    Asthma    as a child   Chronic cystitis    Chronic diastolic heart failure ([place])    Chronic kidney disease    HD pt, 3  times a week.   Diabetes mellitus    Dysrhythmia    GERD (gastroesophageal reflux disease)    H/O hiatal hernia    states it's been fixed   Hyperlipidemia    Hypertension    Lymphedema    Morbid obesity ([place])    NEPHROLITHIASIS, HX OF 12/02/2009   Qualifier: Diagnosis of  By: [name], Cat     PVD (peripheral vascular disease) ([place])    Renal insufficiency    UMBILICAL HERNIA 6/83/4196   Qualifier: History of  By: [name] MD, Shane     Venous insufficiency     Family History  Problem Relation Age of Onset   Diabetes Mother    Heart disease Mother    Diabetes Brother     Past Surgical History:  Procedure Laterality Date   ABDOMINAL AORTOGRAM W/LOWER EXTREMITY N/A 05/19/2017   Procedure: Abdominal Aortogram w/Lower Extremity;  Surgeon: [name], MD;  Location: [place];  Service: Cardiovascular;  Laterality: N/A;   AMPUTATION     Right and left fifth toes.    AMPUTATION TOE     emoval of both little toes   AV FISTULA PLACEMENT Left 03/21/2014   Procedure: ARTERIOVENOUS (AV) FISTULA CREATION with ultrasound;  Surgeon: [name], MD;  Location: [facility];  Service: Vascular;  Laterality: Left;   COLONOSCOPY     EYE SURGERY Bilateral    cataract and lens implant   HERNIA REPAIR     LIGATION OF COMPETING BRANCHES OF ARTERIOVENOUS FISTULA Left 06/29/2015   Procedure: LIGATION OF LEFT ARM RADIOCEPHALIC ARTERIOVENOUS FISTULA SIDE BRANCHES;   Surgeon: Conrad [place], MD;  Location: [facility];  Service: Vascular;  Laterality: Left;   MULTIPLE EXTRACTIONS WITH ALVEOLOPLASTY N/A 04/07/2017   Procedure: Extraction of tooth #'s 1-11, 13, 14,16, 20-23, and 26-28 with alveoloplasty;  Surgeon: Lenn Cal, DDS;  Location: [facility];  Service: Oral Surgery;  Laterality: N/A;   Popliteal to posterior tibial bypass     2006   R knee arthoscopic repair of meniscus     UMBILICAL HERNIA REPAIR     Social History   Occupational History    Employer: DISABLED   Tobacco Use   Smoking status: Former    Pack years: 0.00    Types: Cigarettes    Quit date: 06/06/1979    Years since quitting: 42.0   Smokeless tobacco: Former  Scientific laboratory technician Use: Never used  Substance and Sexual Activity   Alcohol use: No    Alcohol/week: 0.0 standard drinks    Comment:   "years ago" , none now   Drug use: No   Sexual activity: Never

## 2021-06-09 ENCOUNTER — Encounter: Payer: Self-pay | Admitting: Physician Assistant

## 2021-06-09 ENCOUNTER — Other Ambulatory Visit: Payer: Self-pay

## 2021-06-09 ENCOUNTER — Ambulatory Visit (INDEPENDENT_AMBULATORY_CARE_PROVIDER_SITE_OTHER): Payer: Medicare Other | Admitting: Physician Assistant

## 2021-06-09 DIAGNOSIS — L97919 Non-pressure chronic ulcer of unspecified part of right lower leg with unspecified severity: Secondary | ICD-10-CM | POA: Diagnosis not present

## 2021-06-09 DIAGNOSIS — L97929 Non-pressure chronic ulcer of unspecified part of left lower leg with unspecified severity: Secondary | ICD-10-CM | POA: Diagnosis not present

## 2021-06-09 DIAGNOSIS — I87333 Chronic venous hypertension (idiopathic) with ulcer and inflammation of bilateral lower extremity: Secondary | ICD-10-CM | POA: Diagnosis not present

## 2021-06-09 NOTE — Progress Notes (Signed)
Office Visit Note   Patient: [name]           Date of Birth: January 27, 1951           MRN: [id number] Visit Date: 06/09/2021              Requested by: [name], [name] [place],  [place] [address] PCP: [name], [name]  Chief Complaint  Patient presents with   Right Leg - Follow-up   Left Leg - Follow-up      HPI: Patient is a 70 year old gentleman who comes in for examination of his bilateral lower extremities.  He has a history of venous insufficiency.  He has had difficulty being compliant with wearing compression socks.  We have been wrapping his right leg.  He was supposed to be wearing a compression sock on his left leg but comes in with a short ankle cotton sock today.  Also had some swelling and maceration over his great toe which was dressed with Prisma at his last visit complaining today that the left leg is significantly swollen  Assessment & Plan: Visit Diagnoses: No diagnosis found.  Plan: Plan will place compression wrap on left leg.  We will place new Prisma.  On the right side I have given him a 20/30 extra-large compression sock.  He is to wear this directly against the skin.  He is to wear this for 24 hours and take it off wash his leg and place a new compression socks which she has at home.  He is to change these out daily.  We we will look into his vascular referral.  He is due for follow-up with them.   I explained to the patient that it is important for him to change socks daily's and to stay in a compression sock. Follow-Up Instructions: No follow-ups on file.   Ortho Exam  Patient is alert, oriented, no adenopathy, well-dressed, normal affect, normal respiratory effort. Right lower extremity significant decrease in swelling.  He has scaling of the skin but significant wrinkling.  He has 1 small open area posteriorly near the heel.  This does not probe deeply does not tunnel minimal drainage.  New compression sock was placed today.   On the left side he has significant increase in swelling in the calf.  No ascending cellulitis.  Great toe on the left looks significantly improved with healthy granulation tissue at the top no more maceration no cellulitis.  No tenderness over the calf.  Imaging: No results found. No images are attached to the encounter.  Labs: Lab Results  Component Value Date   HGBA1C 6.2 (H) 07/11/2018   HGBA1C 6.8 03/13/2017   HGBA1C 5.7 12/30/2015   ESRSEDRATE 70 (H) 04/04/2015   ESRSEDRATE 81 (H) 07/14/2011   ESRSEDRATE 117 (H) 06/28/2011   CRP 6.1 (H) 04/04/2015   CRP 0.9 (H) 07/14/2011   CRP 4.6 (H) 06/28/2011   LABURIC 9.7 (H) 02/23/2015   REPTSTATUS 09/15/2018 FINAL 09/10/2018   GRAMSTAIN  06/24/2011    RARE WBC PRESENT,BOTH PMN AND MONONUCLEAR NO ORGANISMS SEEN   GRAMSTAIN  06/24/2011    RARE WBC PRESENT,BOTH PMN AND MONONUCLEAR NO ORGANISMS SEEN   CULT  09/10/2018    NO GROWTH 5 DAYS Performed at [facility], [place] [address] [place], [place] [address]    LABORGA MORGANELLA MORGANII 08/21/2015     Lab Results  Component Value Date   ALBUMIN 2.7 (L) 09/12/2018  ALBUMIN 3.1 (L) 09/11/2018   ALBUMIN 3.1 (L) 07/10/2018    Lab Results  Component Value Date   MG 2.0 04/13/2017   MG 1.9 03/22/2017   MG 1.7 03/04/2014   No results found for: VD25OH  No results found for: PREALBUMIN CBC EXTENDED Latest Ref Rng & Units 11/11/2020 09/12/2018 09/11/2018  WBC 4.0 - 10.5 K/uL 8.1 7.2 8.1  RBC 4.22 - 5.81 MIL/uL 3.68(L) 3.25(L) 3.46(L)  HGB 13.0 - 17.0 g/dL 10.9(L) 9.9(L) 10.5(L)  HCT 39.0 - 52.0 % 33.8(L) 30.0(L) 32.8(L)  PLT 150 - 400 K/uL 151 92(L) 82(L)  NEUTROABS 1.7 - 7.7 K/uL 5.7 - -  LYMPHSABS 0.7 - 4.0 K/uL 1.6 - -     There is no height or weight on file to calculate BMI.  Orders:  No orders of the defined types were placed in this encounter.  No orders of the defined types were placed in this encounter.    Procedures: No procedures  performed  Clinical Data: No additional findings.  ROS:  All other systems negative, except as noted in the HPI. Review of Systems  Objective: Vital Signs: There were no vitals taken for this visit.  Specialty Comments:  No specialty comments available.  PMFS History: Patient Active Problem List   Diagnosis Date Noted   Venous stasis    History of endocarditis    Bradycardia    Orthostasis    Chest pain 09/10/2018   Hypovolemia associated with hemodialysis 07/11/2018   Weakness    Thrombocytopenia (HCC) 07/10/2018   Paroxysmal atrial fibrillation ([name]) 05/26/2017   Balanitis 04/25/2017   Penile swelling    Scrotal swelling    UTI (urinary tract infection) 04/13/2017   Anemia of chronic disease    Acute encephalopathy 04/12/2017   Urinary retention 04/10/2017   Bacteremia    RUQ abdominal pain    Gross hematuria    Other pancytopenia ([place])    Sepsis ([place]) 03/21/2017   Diarrhea 03/03/2017   Retained lens material following cataract surgery of both eyes 11/08/2016   Venous stasis ulcers of both lower extremities ([place]) 08/07/2015   Varicose veins of lower extremities with complications 56/81/2751   Malfunction of arteriovenous dialysis fistula (HCC)    CHF (congestive heart failure) ([place]) 06/23/2015   Lymphedema distichiasis syndrome with kidney disease and diabetes mellitus ([place]) 04/06/2015   Lymphedema of lower extremity 04/04/2015   Type II diabetes mellitus with nephropathy ([place]) 04/04/2015   MGUS (monoclonal gammopathy of unknown significance) 06/12/2014   Chronic diastolic heart failure ([place]) 10/30/2013   Special screening for malignant neoplasms, colon 07/09/2013   Proliferative diabetic retinopathy ([place]) 10/23/2012   Knee pain 08/21/2012   Mass of thigh 08/21/2012   FEVER UNSPECIFIED 05/06/2010   ORGANIC IMPOTENCE 12/02/2009   ONYCHOMYCOSIS 07/17/2009   Lymphedema 05/06/2009   ESRD on dialysis ([place]) 06/09/2008   Hyperlipidemia 03/19/2007   Class 2  severe obesity due to excess calories with serious comorbidity and body mass index (BMI) of 35.0 to 35.9 in adult [facility]) 02/08/2007   Essential hypertension 02/08/2007   Atherosclerosis of native arteries of extremity with intermittent claudication ([place]) 02/08/2007   Past Medical History:  Diagnosis Date   Arthritis    Asthma    as a child   Chronic cystitis    Chronic diastolic heart failure ([place])    Chronic kidney disease    HD pt, 3 times a week.   Diabetes mellitus    Dysrhythmia    GERD (gastroesophageal reflux disease)  H/O hiatal hernia    states it's been fixed   Hyperlipidemia    Hypertension    Lymphedema    Morbid obesity ([place])    NEPHROLITHIASIS, HX OF 12/02/2009   Qualifier: Diagnosis of  By: [name], Cat     PVD (peripheral vascular disease) ([place])    Renal insufficiency    UMBILICAL HERNIA 02/04/8249   Qualifier: History of  By: [name] MD, Shane     Venous insufficiency     Family History  Problem Relation Age of Onset   Diabetes Mother    Heart disease Mother    Diabetes Brother     Past Surgical History:  Procedure Laterality Date   ABDOMINAL AORTOGRAM W/LOWER EXTREMITY N/A 05/19/2017   Procedure: Abdominal Aortogram w/Lower Extremity;  Surgeon: [name], MD;  Location: [facility];  Service: Cardiovascular;  Laterality: N/A;   AMPUTATION     Right and left fifth toes.    AMPUTATION TOE     emoval of both little toes   AV FISTULA PLACEMENT Left 03/21/2014   Procedure: ARTERIOVENOUS (AV) FISTULA CREATION with ultrasound;  Surgeon: [name], MD;  Location: [facility];  Service: Vascular;  Laterality: Left;   COLONOSCOPY     EYE SURGERY Bilateral    cataract and lens implant   HERNIA REPAIR     LIGATION OF COMPETING BRANCHES OF ARTERIOVENOUS FISTULA Left 06/29/2015   Procedure: LIGATION OF LEFT ARM RADIOCEPHALIC ARTERIOVENOUS FISTULA SIDE BRANCHES;  Surgeon: Conrad [place], MD;  Location: [facility];  Service: Vascular;  Laterality: Left;   MULTIPLE  EXTRACTIONS WITH ALVEOLOPLASTY N/A 04/07/2017   Procedure: Extraction of tooth #'s 1-11, 13, 14,16, 20-23, and 26-28 with alveoloplasty;  Surgeon: Lenn Cal, DDS;  Location: [facility];  Service: Oral Surgery;  Laterality: N/A;   Popliteal to posterior tibial bypass     2006   R knee arthoscopic repair of meniscus     UMBILICAL HERNIA REPAIR     Social History   Occupational History    Employer: DISABLED   Tobacco Use   Smoking status: Former    Pack years: 0.00    Types: Cigarettes    Quit date: 06/06/1979    Years since quitting: 42.0   Smokeless tobacco: Former  Scientific laboratory technician Use: Never used  Substance and Sexual Activity   Alcohol use: No    Alcohol/week: 0.0 standard drinks    Comment:   "years ago" , none now   Drug use: No   Sexual activity: Never

## 2021-06-14 ENCOUNTER — Other Ambulatory Visit: Payer: Self-pay

## 2021-06-14 ENCOUNTER — Emergency Department (HOSPITAL_COMMUNITY): Payer: Medicare Other

## 2021-06-14 ENCOUNTER — Inpatient Hospital Stay (HOSPITAL_COMMUNITY)
Admission: EM | Admit: 2021-06-14 | Discharge: 2021-06-27 | DRG: 853 | Disposition: A | Discharge status: Skilled Nursing Facility | Payer: Medicare Other | Attending: Internal Medicine | Admitting: Internal Medicine

## 2021-06-14 ENCOUNTER — Encounter (HOSPITAL_COMMUNITY): Payer: Self-pay | Admitting: Family Medicine

## 2021-06-14 DIAGNOSIS — Z8249 Family history of ischemic heart disease and other diseases of the circulatory system: Secondary | ICD-10-CM | POA: Diagnosis not present

## 2021-06-14 DIAGNOSIS — Z20822 Contact with and (suspected) exposure to covid-19: Secondary | ICD-10-CM | POA: Diagnosis present

## 2021-06-14 DIAGNOSIS — Z5900 Homelessness unspecified: Secondary | ICD-10-CM | POA: Diagnosis not present

## 2021-06-14 DIAGNOSIS — G9341 Metabolic encephalopathy: Secondary | ICD-10-CM | POA: Diagnosis present

## 2021-06-14 DIAGNOSIS — Z833 Family history of diabetes mellitus: Secondary | ICD-10-CM

## 2021-06-14 DIAGNOSIS — A419 Sepsis, unspecified organism: Secondary | ICD-10-CM | POA: Diagnosis present

## 2021-06-14 DIAGNOSIS — Z91013 Allergy to seafood: Secondary | ICD-10-CM

## 2021-06-14 DIAGNOSIS — E1165 Type 2 diabetes mellitus with hyperglycemia: Secondary | ICD-10-CM | POA: Diagnosis present

## 2021-06-14 DIAGNOSIS — D631 Anemia in chronic kidney disease: Secondary | ICD-10-CM | POA: Diagnosis present

## 2021-06-14 DIAGNOSIS — E1140 Type 2 diabetes mellitus with diabetic neuropathy, unspecified: Secondary | ICD-10-CM | POA: Diagnosis present

## 2021-06-14 DIAGNOSIS — Z9115 Patient's noncompliance with renal dialysis: Secondary | ICD-10-CM | POA: Diagnosis not present

## 2021-06-14 DIAGNOSIS — I248 Other forms of acute ischemic heart disease: Secondary | ICD-10-CM | POA: Diagnosis present

## 2021-06-14 DIAGNOSIS — I10 Essential (primary) hypertension: Secondary | ICD-10-CM | POA: Diagnosis present

## 2021-06-14 DIAGNOSIS — I739 Peripheral vascular disease, unspecified: Secondary | ICD-10-CM | POA: Diagnosis not present

## 2021-06-14 DIAGNOSIS — K219 Gastro-esophageal reflux disease without esophagitis: Secondary | ICD-10-CM | POA: Diagnosis present

## 2021-06-14 DIAGNOSIS — R6520 Severe sepsis without septic shock: Secondary | ICD-10-CM | POA: Diagnosis present

## 2021-06-14 DIAGNOSIS — I953 Hypotension of hemodialysis: Secondary | ICD-10-CM | POA: Diagnosis not present

## 2021-06-14 DIAGNOSIS — I132 Hypertensive heart and chronic kidney disease with heart failure and with stage 5 chronic kidney disease, or end stage renal disease: Secondary | ICD-10-CM | POA: Diagnosis present

## 2021-06-14 DIAGNOSIS — M609 Myositis, unspecified: Secondary | ICD-10-CM | POA: Diagnosis present

## 2021-06-14 DIAGNOSIS — E1122 Type 2 diabetes mellitus with diabetic chronic kidney disease: Secondary | ICD-10-CM | POA: Diagnosis present

## 2021-06-14 DIAGNOSIS — L0390 Cellulitis, unspecified: Secondary | ICD-10-CM | POA: Diagnosis present

## 2021-06-14 DIAGNOSIS — I70262 Atherosclerosis of native arteries of extremities with gangrene, left leg: Secondary | ICD-10-CM | POA: Diagnosis present

## 2021-06-14 DIAGNOSIS — I70219 Atherosclerosis of native arteries of extremities with intermittent claudication, unspecified extremity: Secondary | ICD-10-CM | POA: Diagnosis present

## 2021-06-14 DIAGNOSIS — L03116 Cellulitis of left lower limb: Secondary | ICD-10-CM

## 2021-06-14 DIAGNOSIS — E1152 Type 2 diabetes mellitus with diabetic peripheral angiopathy with gangrene: Secondary | ICD-10-CM | POA: Diagnosis present

## 2021-06-14 DIAGNOSIS — Y92410 Unspecified street and highway as the place of occurrence of the external cause: Secondary | ICD-10-CM | POA: Diagnosis not present

## 2021-06-14 DIAGNOSIS — Z87891 Personal history of nicotine dependence: Secondary | ICD-10-CM

## 2021-06-14 DIAGNOSIS — Z992 Dependence on renal dialysis: Secondary | ICD-10-CM

## 2021-06-14 DIAGNOSIS — L97529 Non-pressure chronic ulcer of other part of left foot with unspecified severity: Secondary | ICD-10-CM | POA: Diagnosis present

## 2021-06-14 DIAGNOSIS — N186 End stage renal disease: Secondary | ICD-10-CM | POA: Diagnosis present

## 2021-06-14 DIAGNOSIS — D62 Acute posthemorrhagic anemia: Secondary | ICD-10-CM | POA: Diagnosis not present

## 2021-06-14 DIAGNOSIS — N2581 Secondary hyperparathyroidism of renal origin: Secondary | ICD-10-CM | POA: Diagnosis present

## 2021-06-14 DIAGNOSIS — I890 Lymphedema, not elsewhere classified: Secondary | ICD-10-CM | POA: Diagnosis present

## 2021-06-14 DIAGNOSIS — I480 Paroxysmal atrial fibrillation: Secondary | ICD-10-CM | POA: Diagnosis present

## 2021-06-14 DIAGNOSIS — E11621 Type 2 diabetes mellitus with foot ulcer: Secondary | ICD-10-CM | POA: Diagnosis present

## 2021-06-14 DIAGNOSIS — Z01810 Encounter for preprocedural cardiovascular examination: Secondary | ICD-10-CM | POA: Diagnosis not present

## 2021-06-14 DIAGNOSIS — I96 Gangrene, not elsewhere classified: Secondary | ICD-10-CM | POA: Diagnosis not present

## 2021-06-14 DIAGNOSIS — I5032 Chronic diastolic (congestive) heart failure: Secondary | ICD-10-CM | POA: Diagnosis present

## 2021-06-14 DIAGNOSIS — L97519 Non-pressure chronic ulcer of other part of right foot with unspecified severity: Secondary | ICD-10-CM | POA: Diagnosis present

## 2021-06-14 DIAGNOSIS — R4182 Altered mental status, unspecified: Secondary | ICD-10-CM

## 2021-06-14 DIAGNOSIS — E1121 Type 2 diabetes mellitus with diabetic nephropathy: Secondary | ICD-10-CM | POA: Diagnosis present

## 2021-06-14 DIAGNOSIS — D638 Anemia in other chronic diseases classified elsewhere: Secondary | ICD-10-CM | POA: Diagnosis present

## 2021-06-14 DIAGNOSIS — I70213 Atherosclerosis of native arteries of extremities with intermittent claudication, bilateral legs: Secondary | ICD-10-CM

## 2021-06-14 DIAGNOSIS — R778 Other specified abnormalities of plasma proteins: Secondary | ICD-10-CM | POA: Diagnosis not present

## 2021-06-14 DIAGNOSIS — R509 Fever, unspecified: Secondary | ICD-10-CM

## 2021-06-14 DIAGNOSIS — E785 Hyperlipidemia, unspecified: Secondary | ICD-10-CM | POA: Diagnosis present

## 2021-06-14 LAB — COMPREHENSIVE METABOLIC PANEL
ALT: 13 U/L (ref 0–44)
AST: 21 U/L (ref 15–41)
Albumin: 2.7 g/dL — ABNORMAL LOW (ref 3.5–5.0)
Alkaline Phosphatase: 40 U/L (ref 38–126)
Anion gap: 10 (ref 5–15)
BUN: 45 mg/dL — ABNORMAL HIGH (ref 8–23)
CO2: 30 mmol/L (ref 22–32)
Calcium: 9.2 mg/dL (ref 8.9–10.3)
Chloride: 94 mmol/L — ABNORMAL LOW (ref 98–111)
Creatinine, Ser: 14.15 mg/dL — ABNORMAL HIGH (ref 0.61–1.24)
GFR, Estimated: 3 mL/min — ABNORMAL LOW (ref >60)
Glucose, Bld: 113 mg/dL — ABNORMAL HIGH (ref 70–99)
Potassium: 4 mmol/L (ref 3.5–5.1)
Sodium: 134 mmol/L — ABNORMAL LOW (ref 135–145)
Total Bilirubin: 0.9 mg/dL (ref 0.3–1.2)
Total Protein: 7.2 g/dL (ref 6.5–8.1)

## 2021-06-14 LAB — CBC WITH DIFFERENTIAL/PLATELET
Abs Immature Granulocytes: 0.1 10*3/uL — ABNORMAL HIGH (ref 0.00–0.07)
Basophils Absolute: 0 10*3/uL (ref 0.0–0.1)
Basophils Relative: 0 %
Eosinophils Absolute: 0.2 10*3/uL (ref 0.0–0.5)
Eosinophils Relative: 1 %
HCT: 29.6 % — ABNORMAL LOW (ref 39.0–52.0)
Hemoglobin: 9.6 g/dL — ABNORMAL LOW (ref 13.0–17.0)
Immature Granulocytes: 1 %
Lymphocytes Relative: 9 %
Lymphs Abs: 1.4 10*3/uL (ref 0.7–4.0)
MCH: 30 pg (ref 26.0–34.0)
MCHC: 32.4 g/dL (ref 30.0–36.0)
MCV: 92.5 fL (ref 80.0–100.0)
Monocytes Absolute: 1.3 10*3/uL — ABNORMAL HIGH (ref 0.1–1.0)
Monocytes Relative: 8 %
Neutro Abs: 12.4 10*3/uL — ABNORMAL HIGH (ref 1.7–7.7)
Neutrophils Relative %: 81 %
Platelets: 203 10*3/uL (ref 150–400)
RBC: 3.2 MIL/uL — ABNORMAL LOW (ref 4.22–5.81)
RDW: 16.5 % — ABNORMAL HIGH (ref 11.5–15.5)
WBC: 15.3 10*3/uL — ABNORMAL HIGH (ref 4.0–10.5)
nRBC: 0 % (ref 0.0–0.2)

## 2021-06-14 LAB — AMMONIA: Ammonia: 18 umol/L (ref 9–35)

## 2021-06-14 LAB — RESP PANEL BY RT-PCR (FLU A&B, COVID) ARPGX2
Influenza A by PCR: NEGATIVE
Influenza B by PCR: NEGATIVE
SARS Coronavirus 2 by RT PCR: NEGATIVE

## 2021-06-14 LAB — TROPONIN I (HIGH SENSITIVITY): Troponin I (High Sensitivity): 64 ng/L — ABNORMAL HIGH (ref <18)

## 2021-06-14 LAB — LACTIC ACID, PLASMA: Lactic Acid, Venous: 1.2 mmol/L (ref 0.5–1.9)

## 2021-06-14 LAB — ETHANOL: Alcohol, Ethyl (B): <10 mg/dL (ref <10)

## 2021-06-14 MED ORDER — ACETAMINOPHEN 500 MG PO TABS
1000.0000 mg | ORAL_TABLET | Freq: Once | ORAL | Status: AC
  Administered 2021-06-14: 1000 mg via ORAL
  Filled 2021-06-14: qty 2

## 2021-06-14 MED ORDER — ACETAMINOPHEN 650 MG RE SUPP: 650.0000 mg | Freq: Four times a day (QID) | RECTAL | Status: DC | PRN

## 2021-06-14 MED ORDER — HEPARIN SODIUM (PORCINE) 5000 UNIT/ML IJ SOLN
5000.0000 [IU] | Freq: Three times a day (TID) | INTRAMUSCULAR | Status: DC
  Administered 2021-06-15 – 2021-06-23 (×23): 5000 [IU] via SUBCUTANEOUS
  Filled 2021-06-14 (×23): qty 1

## 2021-06-14 MED ORDER — AMLODIPINE BESYLATE 10 MG PO TABS
10.0000 mg | ORAL_TABLET | Freq: Every day | ORAL | Status: DC
  Administered 2021-06-15 – 2021-06-17 (×3): 10 mg via ORAL
  Filled 2021-06-14 (×4): qty 1

## 2021-06-14 MED ORDER — SODIUM CHLORIDE 0.9 % IV SOLN
2.0000 g | INTRAVENOUS | Status: AC
  Administered 2021-06-14: 2 g via INTRAVENOUS
  Filled 2021-06-14: qty 2

## 2021-06-14 MED ORDER — VANCOMYCIN HCL IN DEXTROSE 1-5 GM/200ML-% IV SOLN
1000.0000 mg | INTRAVENOUS | Status: DC
  Administered 2021-06-15 – 2021-06-17 (×2): 1000 mg via INTRAVENOUS
  Filled 2021-06-14 (×4): qty 200

## 2021-06-14 MED ORDER — VANCOMYCIN HCL 10 G IV SOLR
2250.0000 mg | INTRAVENOUS | Status: AC
  Administered 2021-06-14: 2250 mg via INTRAVENOUS
  Filled 2021-06-14: qty 2250

## 2021-06-14 MED ORDER — CHLORHEXIDINE GLUCONATE CLOTH 2 % EX PADS: 6.0000 | MEDICATED_PAD | Freq: Every day | CUTANEOUS | Status: DC

## 2021-06-14 MED ORDER — ACETAMINOPHEN 325 MG PO TABS
650.0000 mg | ORAL_TABLET | Freq: Four times a day (QID) | ORAL | Status: DC | PRN
  Administered 2021-06-22 – 2021-06-26 (×3): 650 mg via ORAL
  Filled 2021-06-14 (×3): qty 2

## 2021-06-14 MED ORDER — ONDANSETRON HCL 4 MG/2ML IJ SOLN
4.0000 mg | Freq: Four times a day (QID) | INTRAMUSCULAR | Status: DC | PRN
  Administered 2021-06-15 – 2021-06-21 (×3): 4 mg via INTRAVENOUS
  Filled 2021-06-14: qty 2

## 2021-06-14 MED ORDER — ONDANSETRON HCL 4 MG PO TABS: 4.0000 mg | ORAL_TABLET | Freq: Four times a day (QID) | ORAL | Status: DC | PRN

## 2021-06-14 MED ORDER — SODIUM CHLORIDE 0.9 % IV SOLN
2.0000 g | INTRAVENOUS | Status: DC
  Administered 2021-06-15 – 2021-06-19 (×3): 2 g via INTRAVENOUS
  Filled 2021-06-14 (×3): qty 2

## 2021-06-14 NOTE — ED Notes (Signed)
I was unable to get pt to sign MSE due to AMS and no family present
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
## 2021-06-14 NOTE — ED Notes (Addendum)
Pt's LE bilat has dry flaky skin and has a stage 2 wound on the anterior of right foot and posterior of right foot. Pt has necrotic digits 2-4 of left foot.

## 2021-06-14 NOTE — ED Notes (Signed)
Paged nephrology
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
## 2021-06-14 NOTE — ED Notes (Signed)
Patient transported to X-ray 

## 2021-06-14 NOTE — ED Notes (Signed)
Patient transported to CT 

## 2021-06-14 NOTE — Progress Notes (Signed)
Pharmacy Antibiotic Note  [name] is a 70 y.o. male admitted on 06/14/2021 with  R foot wound infection .  Pharmacy has been consulted for Vancomycin and Cefepime dosing. Pt with ESRD - usual o/p HD on T/T/S but missed Sat HD.  Plan: Cefepime 2gm IV now and qHD Vancomycin 2250 mg IV now then 1000 mg IV qHD  Will f/u HD schedule/tolerance, micro data, and pt's clinical condition Vanc levels prn   Height: 5\' 11"  (180.3 cm) Weight: 99.8 kg (220 lb 0.3 oz) IBW/kg (Calculated) : 75.3  Temp (24hrs), Avg:99.3 F (37.4 C), Min:98.4 F (36.9 C), Max:100.9 F (38.3 C)  Recent Labs  Lab 06/14/21 1635  WBC 15.3*  CREATININE 14.15*  LATICACIDVEN 1.2    Estimated Creatinine Clearance: 5.7 mL/min (A) (by C-G formula based on SCr of 14.15 mg/dL (H)).    Allergies  Allergen Reactions   Other Swelling and Itching   Shrimp [Shellfish Allergy] Swelling    Antimicrobials this admission: 7/4 Vanc >>  7/4 Cefepime >>   Microbiology results: 7/4 BCx:  Thank you for allowing pharmacy to be a part of this patient's care.  Sherlon Handing, PharmD, BCPS Please see amion for complete clinical pharmacist phone list 06/14/2021 8:59 PM
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
## 2021-06-15 ENCOUNTER — Inpatient Hospital Stay (HOSPITAL_COMMUNITY): Payer: Medicare Other

## 2021-06-15 ENCOUNTER — Other Ambulatory Visit: Payer: Self-pay

## 2021-06-15 DIAGNOSIS — A419 Sepsis, unspecified organism: Secondary | ICD-10-CM | POA: Diagnosis not present

## 2021-06-15 DIAGNOSIS — L0390 Cellulitis, unspecified: Secondary | ICD-10-CM | POA: Diagnosis not present

## 2021-06-15 DIAGNOSIS — I70262 Atherosclerosis of native arteries of extremities with gangrene, left leg: Secondary | ICD-10-CM

## 2021-06-15 DIAGNOSIS — N186 End stage renal disease: Secondary | ICD-10-CM

## 2021-06-15 DIAGNOSIS — I10 Essential (primary) hypertension: Secondary | ICD-10-CM

## 2021-06-15 DIAGNOSIS — Z992 Dependence on renal dialysis: Secondary | ICD-10-CM

## 2021-06-15 LAB — RAPID URINE DRUG SCREEN, HOSP PERFORMED
Amphetamines: NOT DETECTED
Barbiturates: NOT DETECTED
Benzodiazepines: NOT DETECTED
Cocaine: NOT DETECTED
Opiates: NOT DETECTED
Tetrahydrocannabinol: NOT DETECTED

## 2021-06-15 LAB — CBC
HCT: 30.2 % — ABNORMAL LOW (ref 39.0–52.0)
Hemoglobin: 9.5 g/dL — ABNORMAL LOW (ref 13.0–17.0)
MCH: 29.5 pg (ref 26.0–34.0)
MCHC: 31.5 g/dL (ref 30.0–36.0)
MCV: 93.8 fL (ref 80.0–100.0)
Platelets: 145 10*3/uL — ABNORMAL LOW (ref 150–400)
RBC: 3.22 MIL/uL — ABNORMAL LOW (ref 4.22–5.81)
RDW: 16.3 % — ABNORMAL HIGH (ref 11.5–15.5)
WBC: 8.7 10*3/uL (ref 4.0–10.5)
nRBC: 0 % (ref 0.0–0.2)

## 2021-06-15 LAB — HEMOGLOBIN A1C
Hgb A1c MFr Bld: 4.9 % (ref 4.8–5.6)
Mean Plasma Glucose: 93.93 mg/dL

## 2021-06-15 LAB — URINALYSIS, ROUTINE W REFLEX MICROSCOPIC
Bacteria, UA: NONE SEEN
Bilirubin Urine: NEGATIVE
Glucose, UA: 50 mg/dL — AB
Ketones, ur: NEGATIVE mg/dL
Leukocytes,Ua: NEGATIVE
Nitrite: NEGATIVE
Protein, ur: 100 mg/dL — AB
Specific Gravity, Urine: 1.01 (ref 1.005–1.030)
pH: 9 — ABNORMAL HIGH (ref 5.0–8.0)

## 2021-06-15 LAB — BASIC METABOLIC PANEL
Anion gap: 12 (ref 5–15)
BUN: 48 mg/dL — ABNORMAL HIGH (ref 8–23)
CO2: 28 mmol/L (ref 22–32)
Calcium: 9.3 mg/dL (ref 8.9–10.3)
Chloride: 94 mmol/L — ABNORMAL LOW (ref 98–111)
Creatinine, Ser: 14.34 mg/dL — ABNORMAL HIGH (ref 0.61–1.24)
GFR, Estimated: 3 mL/min — ABNORMAL LOW (ref >60)
Glucose, Bld: 118 mg/dL — ABNORMAL HIGH (ref 70–99)
Potassium: 3.6 mmol/L (ref 3.5–5.1)
Sodium: 134 mmol/L — ABNORMAL LOW (ref 135–145)

## 2021-06-15 LAB — RAPID HIV SCREEN (HIV 1/2 AB+AG)
HIV 1/2 Antibodies: NONREACTIVE
HIV-1 P24 Antigen - HIV24: NONREACTIVE

## 2021-06-15 LAB — PHOSPHORUS: Phosphorus: 4.1 mg/dL (ref 2.5–4.6)

## 2021-06-15 LAB — TSH: TSH: 1.819 u[IU]/mL (ref 0.350–4.500)

## 2021-06-15 LAB — C-REACTIVE PROTEIN
CRP: 21.3 mg/dL — ABNORMAL HIGH (ref <1.0)
CRP: 20.8 mg/dL — ABNORMAL HIGH (ref <1.0)

## 2021-06-15 LAB — AMMONIA: Ammonia: 18 umol/L (ref 9–35)

## 2021-06-15 LAB — GLUCOSE, CAPILLARY
Glucose-Capillary: 110 mg/dL — ABNORMAL HIGH (ref 70–99)
Glucose-Capillary: 134 mg/dL — ABNORMAL HIGH (ref 70–99)

## 2021-06-15 LAB — TROPONIN I (HIGH SENSITIVITY)
Troponin I (High Sensitivity): 149 ng/L (ref <18)
Troponin I (High Sensitivity): 155 ng/L (ref <18)

## 2021-06-15 LAB — HIV ANTIBODY (ROUTINE TESTING W REFLEX): HIV Screen 4th Generation wRfx: NONREACTIVE

## 2021-06-15 LAB — RPR: RPR Ser Ql: NONREACTIVE

## 2021-06-15 LAB — MAGNESIUM: Magnesium: 1.9 mg/dL (ref 1.7–2.4)

## 2021-06-15 LAB — SEDIMENTATION RATE: Sed Rate: 122 mm/hr — ABNORMAL HIGH (ref 0–16)

## 2021-06-15 LAB — VITAMIN B12: Vitamin B-12: 281 pg/mL (ref 180–914)

## 2021-06-15 MED ORDER — PENTAFLUOROPROP-TETRAFLUOROETH EX AERO
1.0000 "application " | INHALATION_SPRAY | CUTANEOUS | Status: DC | PRN
  Filled 2021-06-15: qty 116

## 2021-06-15 MED ORDER — HEPARIN SODIUM (PORCINE) 1000 UNIT/ML DIALYSIS
3000.0000 [IU] | Freq: Once | INTRAMUSCULAR | Status: DC
  Filled 2021-06-15: qty 3

## 2021-06-15 MED ORDER — LIDOCAINE-PRILOCAINE 2.5-2.5 % EX CREA
1.0000 "application " | TOPICAL_CREAM | CUTANEOUS | Status: DC | PRN
  Filled 2021-06-15: qty 5

## 2021-06-15 MED ORDER — METRONIDAZOLE 500 MG PO TABS
500.0000 mg | ORAL_TABLET | Freq: Three times a day (TID) | ORAL | Status: DC
  Administered 2021-06-15 – 2021-06-21 (×18): 500 mg via ORAL
  Filled 2021-06-15 (×18): qty 1

## 2021-06-15 MED ORDER — INSULIN ASPART 100 UNIT/ML IJ SOLN
0.0000 [IU] | Freq: Three times a day (TID) | INTRAMUSCULAR | Status: DC
  Administered 2021-06-16 – 2021-06-19 (×4): 1 [IU] via SUBCUTANEOUS
  Administered 2021-06-19: 2 [IU] via SUBCUTANEOUS
  Administered 2021-06-19: 1 [IU] via SUBCUTANEOUS

## 2021-06-15 MED ORDER — ASPIRIN EC 81 MG PO TBEC
81.0000 mg | DELAYED_RELEASE_TABLET | Freq: Every day | ORAL | Status: DC
  Administered 2021-06-15 – 2021-06-27 (×12): 81 mg via ORAL
  Filled 2021-06-15 (×13): qty 1

## 2021-06-15 MED ORDER — ATORVASTATIN CALCIUM 40 MG PO TABS
40.0000 mg | ORAL_TABLET | Freq: Every day | ORAL | Status: DC
  Administered 2021-06-15 – 2021-06-19 (×4): 40 mg via ORAL
  Filled 2021-06-15 (×4): qty 1

## 2021-06-15 MED ORDER — HEPARIN SODIUM (PORCINE) 1000 UNIT/ML DIALYSIS
1000.0000 [IU] | INTRAMUSCULAR | Status: DC | PRN
  Filled 2021-06-15: qty 1

## 2021-06-15 MED ORDER — SODIUM CHLORIDE 0.9 % IV SOLN: 100.0000 mL | INTRAVENOUS | Status: DC | PRN

## 2021-06-15 MED ORDER — ONDANSETRON HCL 4 MG/2ML IJ SOLN
INTRAMUSCULAR | Status: AC
  Filled 2021-06-15: qty 2

## 2021-06-15 MED ORDER — ALTEPLASE 2 MG IJ SOLR: 2.0000 mg | Freq: Once | INTRAMUSCULAR | Status: DC | PRN

## 2021-06-15 MED ORDER — LIDOCAINE HCL (PF) 1 % IJ SOLN: 5.0000 mL | INTRAMUSCULAR | Status: DC | PRN

## 2021-06-15 NOTE — Progress Notes (Signed)
Received report from hemodialysis nurse and pt was admitted to 4E. Pt connected to tele and CHG bath given. VSS and pt A&Ox4.

## 2021-06-15 NOTE — Progress Notes (Signed)
Hemodialysis- Report called to 4E. Awaiting room to be cleaned.

## 2021-06-15 NOTE — ED Notes (Signed)
[facility] informed of pts trop f 149, pt denies cp

## 2021-06-15 NOTE — Progress Notes (Signed)
Hemodialysis- Hypotensive episode with dialysis. Patient unresponsive. Given 300cc saline and HOB down. Arousable after approx 30 seconds with some residual nausea. UF remains off and goal is reduced. BP back to baseline at 111/56, HR 77, Sats remained stable, currently afebrile at 98.3. Patient does have mild chills, given warm blanket.
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
## 2021-06-15 NOTE — ED Notes (Signed)
Pt hard stick, rn notified phlebotomy for troponin
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
## 2021-06-15 NOTE — ED Notes (Signed)
Placed Breakfast Order 

## 2021-06-16 ENCOUNTER — Inpatient Hospital Stay (HOSPITAL_COMMUNITY): Payer: Medicare Other

## 2021-06-16 ENCOUNTER — Ambulatory Visit: Payer: Medicare Other | Admitting: Physician Assistant

## 2021-06-16 DIAGNOSIS — I248 Other forms of acute ischemic heart disease: Secondary | ICD-10-CM | POA: Diagnosis not present

## 2021-06-16 DIAGNOSIS — R778 Other specified abnormalities of plasma proteins: Secondary | ICD-10-CM

## 2021-06-16 DIAGNOSIS — L0390 Cellulitis, unspecified: Secondary | ICD-10-CM | POA: Diagnosis not present

## 2021-06-16 DIAGNOSIS — A419 Sepsis, unspecified organism: Secondary | ICD-10-CM | POA: Diagnosis not present

## 2021-06-16 LAB — CBC
HCT: 28.4 % — ABNORMAL LOW (ref 39.0–52.0)
Hemoglobin: 9.2 g/dL — ABNORMAL LOW (ref 13.0–17.0)
MCH: 29.8 pg (ref 26.0–34.0)
MCHC: 32.4 g/dL (ref 30.0–36.0)
MCV: 91.9 fL (ref 80.0–100.0)
Platelets: 97 10*3/uL — ABNORMAL LOW (ref 150–400)
RBC: 3.09 MIL/uL — ABNORMAL LOW (ref 4.22–5.81)
RDW: 16.6 % — ABNORMAL HIGH (ref 11.5–15.5)
WBC: 9 10*3/uL (ref 4.0–10.5)
nRBC: 0 % (ref 0.0–0.2)

## 2021-06-16 LAB — C-REACTIVE PROTEIN: CRP: 26.3 mg/dL — ABNORMAL HIGH (ref <1.0)

## 2021-06-16 LAB — GLUCOSE, CAPILLARY
Glucose-Capillary: 138 mg/dL — ABNORMAL HIGH (ref 70–99)
Glucose-Capillary: 197 mg/dL — ABNORMAL HIGH (ref 70–99)
Glucose-Capillary: 163 mg/dL — ABNORMAL HIGH (ref 70–99)
Glucose-Capillary: 178 mg/dL — ABNORMAL HIGH (ref 70–99)

## 2021-06-16 LAB — BASIC METABOLIC PANEL
Anion gap: 7 (ref 5–15)
BUN: 29 mg/dL — ABNORMAL HIGH (ref 8–23)
CO2: 29 mmol/L (ref 22–32)
Calcium: 8.8 mg/dL — ABNORMAL LOW (ref 8.9–10.3)
Chloride: 97 mmol/L — ABNORMAL LOW (ref 98–111)
Creatinine, Ser: 8.97 mg/dL — ABNORMAL HIGH (ref 0.61–1.24)
GFR, Estimated: 6 mL/min — ABNORMAL LOW (ref >60)
Glucose, Bld: 153 mg/dL — ABNORMAL HIGH (ref 70–99)
Potassium: 4.2 mmol/L (ref 3.5–5.1)
Sodium: 133 mmol/L — ABNORMAL LOW (ref 135–145)

## 2021-06-16 LAB — ECHOCARDIOGRAM COMPLETE
Area-P 1/2: 3.63 cm2
Calc EF: 56.3 %
Height: 71 in
S' Lateral: 3.5 cm
Single Plane A2C EF: 58.9 %
Single Plane A4C EF: 52.1 %
Weight: 3520.31 oz

## 2021-06-16 LAB — LIPID PANEL
Cholesterol: 117 mg/dL (ref 0–200)
HDL: 12 mg/dL — ABNORMAL LOW (ref >40)
LDL Cholesterol: 53 mg/dL (ref 0–99)
Total CHOL/HDL Ratio: 9.8 RATIO
Triglycerides: 262 mg/dL — ABNORMAL HIGH (ref <150)
VLDL: 52 mg/dL — ABNORMAL HIGH (ref 0–40)

## 2021-06-16 LAB — SEDIMENTATION RATE: Sed Rate: 95 mm/hr — ABNORMAL HIGH (ref 0–16)

## 2021-06-16 MED ORDER — PERFLUTREN LIPID MICROSPHERE
1.0000 mL | INTRAVENOUS | Status: AC | PRN
  Administered 2021-06-16: 3 mL via INTRAVENOUS
  Filled 2021-06-16: qty 10

## 2021-06-16 MED ORDER — CHLORHEXIDINE GLUCONATE CLOTH 2 % EX PADS
6.0000 | MEDICATED_PAD | Freq: Every day | CUTANEOUS | Status: DC
  Administered 2021-06-16 – 2021-06-27 (×8): 6 via TOPICAL

## 2021-06-16 NOTE — Progress Notes (Addendum)
Patient assisted with rolling walker from bed to chair. Patient with slow movements but was able to stand with assistance from nursing staff and pivot to chair. Slightly unsteady. Call bell with in reach. Juvia Aerts, Bettina Gavia RN

## 2021-06-16 NOTE — Progress Notes (Signed)
  Echocardiogram 2D Echocardiogram with defintiy has been performed.  [name] M 06/16/2021, 9:58 AM
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
## 2021-06-16 NOTE — Progress Notes (Signed)
Mobility Specialist: Progress Note   06/16/21 1238  Mobility  Activity Ambulated in hall  Level of Assistance Moderate assist, patient does 50-74%  Assistive Device Front wheel walker  Distance Ambulated (ft) 100 ft  Mobility Ambulated with assistance in hallway  Mobility Response Tolerated fair  Mobility performed by Mobility specialist  Bed Position Chair  $Mobility charge 1 Mobility   Pre-Mobility: 65 HR, 100% SpO2 Post-Mobility: 75 HR, 183/84 BP, 100% SpO2  Pt required modA to stand from chair and was contact guard during ambulation. Pt slightly unsteady at the beginning of ambulation and experienced one episode of LOB to his L side when walking around the end of the bed. Pt removed his L hand from the RW in an attempt to catch himself on the bed. Pt required modA to recover and was instructed to maintain both hands on the RW throughout ambulation. Pt back to recliner after walk with call bell and phone in reach and is set-up with his lunch. RN notified of pt's LOB.   [facility] Teodor Prater Mobility Specialist Mobility Specialist Phone: [phone]

## 2021-06-16 NOTE — Progress Notes (Signed)
  Progress Note    06/16/2021 8:09 AM Hospital Day 2  Subjective:  legs feel about the same   Vitals:   06/16/21 0339 06/16/21 0805  BP: (!) 162/61 (!) 167/70  Pulse: 60 61  Resp: 17 16  Temp: 98 F (36.7 C) 97.9 F (36.6 C)  SpO2: 98% 99%    Physical Exam: Cardiac:  irregular Lungs:  non labored Extremities:  brisk left AT>peroneal and brisk right PT Toes on left foot malodorous  CBC    Component Value Date/Time   WBC 9.0 06/16/2021 0232   RBC 3.09 (L) 06/16/2021 0232   HGB 9.2 (L) 06/16/2021 0232   HGB 11.8 (L) 03/03/2017 1635   HGB 9.4 (L) 07/03/2014 0812   HCT 28.4 (L) 06/16/2021 0232   HCT 26.6 (L) 03/22/2017 1207   HCT 29.0 (L) 07/03/2014 0812   PLT 97 (L) 06/16/2021 0232   PLT 170 03/03/2017 1635   MCV 91.9 06/16/2021 0232   MCV 91 03/03/2017 1635   MCV 86.1 07/03/2014 0812   MCH 29.8 06/16/2021 0232   MCHC 32.4 06/16/2021 0232   RDW 16.6 (H) 06/16/2021 0232   RDW 16.9 (H) 03/03/2017 1635   RDW 16.3 (H) 07/03/2014 0812   LYMPHSABS 1.4 06/14/2021 1635   LYMPHSABS 1.5 07/03/2014 0812   MONOABS 1.3 (H) 06/14/2021 1635   MONOABS 0.7 07/03/2014 0812   EOSABS 0.2 06/14/2021 1635   EOSABS 0.6 (H) 07/03/2014 0812   BASOSABS 0.0 06/14/2021 1635   BASOSABS 0.1 07/03/2014 0812    BMET    Component Value Date/Time   NA 133 (L) 06/16/2021 0232   NA 138 03/03/2017 1635   NA 142 07/03/2014 0814   K 4.2 06/16/2021 0232   K 4.4 07/03/2014 0814   CL 97 (L) 06/16/2021 0232   CO2 29 06/16/2021 0232   CO2 20 (L) 07/03/2014 0814   GLUCOSE 153 (H) 06/16/2021 0232   GLUCOSE 136 07/03/2014 0814   BUN 29 (H) 06/16/2021 0232   BUN 36 (H) 03/03/2017 1635   BUN 93.6 (H) 07/03/2014 0814   CREATININE 8.97 (H) 06/16/2021 0232   CREATININE 7.3 (HH) 07/03/2014 0814   CALCIUM 8.8 (L) 06/16/2021 0232   CALCIUM 8.6 07/03/2014 0814   GFRNONAA 6 (L) 06/16/2021 0232   GFRNONAA 31 (L) 08/16/2012 1517   GFRAA 6 (L) 09/12/2018 0443   GFRAA 36 (L) 08/16/2012 1517     INR    Component Value Date/Time   INR 1.00 07/10/2018 1654     Intake/Output Summary (Last 24 hours) at 06/16/2021 0809 Last data filed at 06/16/2021 0807 Gross per 24 hour  Intake 262.64 ml  Output 1000 ml  Net -737.36 ml     Assessment/Plan:  70 y.o. male with gangrenous toes left foot Hospital Day 2  -plan for arteriogram on Friday.   Leontine Locket, PA-C [facility] [phone] 06/16/2021 8:09 AM
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
## 2021-06-17 DIAGNOSIS — L0390 Cellulitis, unspecified: Secondary | ICD-10-CM | POA: Diagnosis not present

## 2021-06-17 DIAGNOSIS — A419 Sepsis, unspecified organism: Secondary | ICD-10-CM | POA: Diagnosis not present

## 2021-06-17 LAB — CBC
HCT: 26.4 % — ABNORMAL LOW (ref 39.0–52.0)
Hemoglobin: 8.7 g/dL — ABNORMAL LOW (ref 13.0–17.0)
MCH: 29.3 pg (ref 26.0–34.0)
MCHC: 33 g/dL (ref 30.0–36.0)
MCV: 88.9 fL (ref 80.0–100.0)
Platelets: 115 10*3/uL — ABNORMAL LOW (ref 150–400)
RBC: 2.97 MIL/uL — ABNORMAL LOW (ref 4.22–5.81)
RDW: 16.5 % — ABNORMAL HIGH (ref 11.5–15.5)
WBC: 13.3 10*3/uL — ABNORMAL HIGH (ref 4.0–10.5)
nRBC: 0 % (ref 0.0–0.2)

## 2021-06-17 LAB — BASIC METABOLIC PANEL
Anion gap: 11 (ref 5–15)
BUN: 42 mg/dL — ABNORMAL HIGH (ref 8–23)
CO2: 26 mmol/L (ref 22–32)
Calcium: 9.1 mg/dL (ref 8.9–10.3)
Chloride: 94 mmol/L — ABNORMAL LOW (ref 98–111)
Creatinine, Ser: 10.62 mg/dL — ABNORMAL HIGH (ref 0.61–1.24)
GFR, Estimated: 5 mL/min — ABNORMAL LOW (ref >60)
Glucose, Bld: 158 mg/dL — ABNORMAL HIGH (ref 70–99)
Potassium: 3.8 mmol/L (ref 3.5–5.1)
Sodium: 131 mmol/L — ABNORMAL LOW (ref 135–145)

## 2021-06-17 LAB — GLUCOSE, CAPILLARY
Glucose-Capillary: 135 mg/dL — ABNORMAL HIGH (ref 70–99)
Glucose-Capillary: 93 mg/dL (ref 70–99)
Glucose-Capillary: 173 mg/dL — ABNORMAL HIGH (ref 70–99)
Glucose-Capillary: 124 mg/dL — ABNORMAL HIGH (ref 70–99)

## 2021-06-17 LAB — C-REACTIVE PROTEIN: CRP: 20.5 mg/dL — ABNORMAL HIGH (ref <1.0)

## 2021-06-17 LAB — SEDIMENTATION RATE: Sed Rate: 112 mm/hr — ABNORMAL HIGH (ref 0–16)

## 2021-06-17 NOTE — Progress Notes (Signed)
Clean, dry and intact upon leaving Dialysis unit
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
## 2021-06-17 NOTE — Progress Notes (Signed)
Pharmacy Antibiotic Note  [name] is a 70 y.o. male admitted on 06/14/2021 with  wound infection .  Pharmacy has been consulted for Vanco + Cefepime dosing.  ID: R foot infection,  L 3rd/4th toe gangrene - Tmax 99. WBC 13.3 up. ESRD, CRP remains elevated. Sed rate elevated Plan for LLE arteriogram on Friday 7/8.  7/4 Vanc >>  7/4 Cefepime >>  7/5 Flagyl>>  7/4 BCx:  ? If sent yet 7/4 Covid 19: neg;  flu: neg  Plan: Con't Cefepime 2gm IV now and qHD TTS Vancomycin 1000 mg IV qHD  TTS Flagyl 500mg  po TID -plan for LLE arteriogram on Friday 7/8.   Height: 5\' 11"  (180.3 cm) Weight: 97.7 kg (215 lb 6.2 oz) IBW/kg (Calculated) : 75.3  Temp (24hrs), Avg:98.5 F (36.9 C), Min:98.1 F (36.7 C), Max:99 F (37.2 C)  Recent Labs  Lab 06/14/21 1635 06/15/21 0158 06/16/21 0232 06/17/21 0118  WBC 15.3* 8.7 9.0 13.3*  CREATININE 14.15* 14.34* 8.97* 10.62*  LATICACIDVEN 1.2  --   --   --     Estimated Creatinine Clearance: 7.5 mL/min (A) (by C-G formula based on SCr of 10.62 mg/dL (H)).    Allergies  Allergen Reactions   Other Swelling and Itching   Shrimp [Shellfish Allergy] Swelling   Jester Klingberg S. Alford Highland, PharmD, BCPS Clinical Staff Pharmacist [email or web address]  Wayland Salinas 06/17/2021 11:31 AM

## 2021-06-17 NOTE — Progress Notes (Signed)
   NPO past MN Angiogram with possible intervention Dr. Oneida Alar Patient agrees with plan  Roxy Horseman PA-C
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
## 2021-06-18 ENCOUNTER — Encounter (HOSPITAL_COMMUNITY)
Admission: EM | Disposition: A | Discharge status: Skilled Nursing Facility | Payer: Self-pay | Source: Home / Self Care | Attending: Internal Medicine

## 2021-06-18 ENCOUNTER — Encounter (HOSPITAL_COMMUNITY): Payer: Self-pay | Admitting: Family Medicine

## 2021-06-18 ENCOUNTER — Inpatient Hospital Stay (HOSPITAL_COMMUNITY): Payer: Medicare Other

## 2021-06-18 ENCOUNTER — Inpatient Hospital Stay (HOSPITAL_COMMUNITY): Payer: Medicare Other | Admitting: Certified Registered"

## 2021-06-18 DIAGNOSIS — I96 Gangrene, not elsewhere classified: Secondary | ICD-10-CM

## 2021-06-18 DIAGNOSIS — Z01810 Encounter for preprocedural cardiovascular examination: Secondary | ICD-10-CM

## 2021-06-18 HISTORY — PX: FEMORAL-TIBIAL BYPASS GRAFT: SHX938

## 2021-06-18 HISTORY — PX: AMPUTATION: SHX166

## 2021-06-18 HISTORY — PX: ABDOMINAL AORTOGRAM W/LOWER EXTREMITY: CATH118223

## 2021-06-18 LAB — BASIC METABOLIC PANEL
Anion gap: 8 (ref 5–15)
BUN: 21 mg/dL (ref 8–23)
CO2: 27 mmol/L (ref 22–32)
Calcium: 9 mg/dL (ref 8.9–10.3)
Chloride: 96 mmol/L — ABNORMAL LOW (ref 98–111)
Creatinine, Ser: 6.8 mg/dL — ABNORMAL HIGH (ref 0.61–1.24)
GFR, Estimated: 8 mL/min — ABNORMAL LOW (ref >60)
Glucose, Bld: 99 mg/dL (ref 70–99)
Potassium: 4.3 mmol/L (ref 3.5–5.1)
Sodium: 131 mmol/L — ABNORMAL LOW (ref 135–145)

## 2021-06-18 LAB — CBC
HCT: 29 % — ABNORMAL LOW (ref 39.0–52.0)
Hemoglobin: 9.4 g/dL — ABNORMAL LOW (ref 13.0–17.0)
MCH: 29.5 pg (ref 26.0–34.0)
MCHC: 32.4 g/dL (ref 30.0–36.0)
MCV: 90.9 fL (ref 80.0–100.0)
Platelets: 137 10*3/uL — ABNORMAL LOW (ref 150–400)
RBC: 3.19 MIL/uL — ABNORMAL LOW (ref 4.22–5.81)
RDW: 16.8 % — ABNORMAL HIGH (ref 11.5–15.5)
WBC: 11.9 10*3/uL — ABNORMAL HIGH (ref 4.0–10.5)
nRBC: 0 % (ref 0.0–0.2)

## 2021-06-18 LAB — GLUCOSE, CAPILLARY
Glucose-Capillary: 105 mg/dL — ABNORMAL HIGH (ref 70–99)
Glucose-Capillary: 98 mg/dL (ref 70–99)
Glucose-Capillary: 238 mg/dL — ABNORMAL HIGH (ref 70–99)

## 2021-06-18 LAB — SURGICAL PCR SCREEN
MRSA, PCR: NEGATIVE
Staphylococcus aureus: NEGATIVE

## 2021-06-18 LAB — POCT I-STAT, CHEM 8
BUN: 27 mg/dL — ABNORMAL HIGH (ref 8–23)
Calcium, Ion: 1.2 mmol/L (ref 1.15–1.40)
Chloride: 102 mmol/L (ref 98–111)
Creatinine, Ser: 7.7 mg/dL — ABNORMAL HIGH (ref 0.61–1.24)
Glucose, Bld: 148 mg/dL — ABNORMAL HIGH (ref 70–99)
HCT: 29 % — ABNORMAL LOW (ref 39.0–52.0)
Hemoglobin: 9.9 g/dL — ABNORMAL LOW (ref 13.0–17.0)
Potassium: 4.2 mmol/L (ref 3.5–5.1)
Sodium: 134 mmol/L — ABNORMAL LOW (ref 135–145)
TCO2: 26 mmol/L (ref 22–32)

## 2021-06-18 LAB — METHYLMALONIC ACID, SERUM: Methylmalonic Acid, Quantitative: 625 nmol/L — ABNORMAL HIGH (ref 0–378)

## 2021-06-18 SURGERY — ABDOMINAL AORTOGRAM W/LOWER EXTREMITY
Anesthesia: LOCAL

## 2021-06-18 SURGERY — CREATION, BYPASS, ARTERIAL, FEMORAL TO TIBIAL, USING GRAFT
Anesthesia: General | Site: Leg Lower | Laterality: Left

## 2021-06-18 MED ORDER — SODIUM CHLORIDE 0.9% FLUSH: 3.0000 mL | INTRAVENOUS | Status: DC | PRN

## 2021-06-18 MED ORDER — LIDOCAINE HCL (PF) 1 % IJ SOLN
INTRAMUSCULAR | Status: AC
  Filled 2021-06-18: qty 30

## 2021-06-18 MED ORDER — CHLORHEXIDINE GLUCONATE CLOTH 2 % EX PADS
6.0000 | MEDICATED_PAD | Freq: Every day | CUTANEOUS | Status: DC
  Administered 2021-06-19 – 2021-06-21 (×3): 6 via TOPICAL

## 2021-06-18 MED ORDER — ONDANSETRON HCL 4 MG/2ML IJ SOLN
INTRAMUSCULAR | Status: AC
  Filled 2021-06-18: qty 2

## 2021-06-18 MED ORDER — ACETAMINOPHEN 160 MG/5ML PO SOLN: 1000.0000 mg | Freq: Once | ORAL | Status: DC | PRN

## 2021-06-18 MED ORDER — EPHEDRINE SULFATE-NACL 50-0.9 MG/10ML-% IV SOSY
PREFILLED_SYRINGE | INTRAVENOUS | Status: DC | PRN
  Administered 2021-06-18: 15 mg via INTRAVENOUS
  Administered 2021-06-18 (×2): 10 mg via INTRAVENOUS

## 2021-06-18 MED ORDER — LIDOCAINE HCL (PF) 1 % IJ SOLN
INTRAMUSCULAR | Status: DC | PRN
  Administered 2021-06-18: 15 mL via INTRADERMAL

## 2021-06-18 MED ORDER — HEPARIN 6000 UNIT IRRIGATION SOLUTION
Status: AC
  Filled 2021-06-18: qty 500

## 2021-06-18 MED ORDER — HEPARIN (PORCINE) IN NACL 1000-0.9 UT/500ML-% IV SOLN
INTRAVENOUS | Status: AC
  Filled 2021-06-18: qty 1000

## 2021-06-18 MED ORDER — CEFAZOLIN SODIUM-DEXTROSE 2-3 GM-%(50ML) IV SOLR
INTRAVENOUS | Status: DC | PRN
  Administered 2021-06-18: 2 g via INTRAVENOUS

## 2021-06-18 MED ORDER — OXYCODONE HCL 5 MG PO TABS: 5.0000 mg | ORAL_TABLET | Freq: Once | ORAL | Status: DC | PRN

## 2021-06-18 MED ORDER — PROPOFOL 10 MG/ML IV BOLUS
INTRAVENOUS | Status: DC | PRN
  Administered 2021-06-18: 110 mg via INTRAVENOUS

## 2021-06-18 MED ORDER — DEXAMETHASONE SODIUM PHOSPHATE 10 MG/ML IJ SOLN
INTRAMUSCULAR | Status: DC | PRN
  Administered 2021-06-18: 10 mg via INTRAVENOUS

## 2021-06-18 MED ORDER — OXYCODONE HCL 5 MG PO TABS: 5.0000 mg | ORAL_TABLET | ORAL | Status: DC | PRN

## 2021-06-18 MED ORDER — HEPARIN 6000 UNIT IRRIGATION SOLUTION
Status: DC | PRN
  Administered 2021-06-18: 1

## 2021-06-18 MED ORDER — ACETAMINOPHEN 10 MG/ML IV SOLN: 1000.0000 mg | Freq: Once | INTRAVENOUS | Status: DC | PRN

## 2021-06-18 MED ORDER — FENTANYL CITRATE (PF) 250 MCG/5ML IJ SOLN
INTRAMUSCULAR | Status: AC
  Filled 2021-06-18: qty 5

## 2021-06-18 MED ORDER — SUGAMMADEX SODIUM 200 MG/2ML IV SOLN
INTRAVENOUS | Status: DC | PRN
  Administered 2021-06-18: 200 mg via INTRAVENOUS

## 2021-06-18 MED ORDER — METOPROLOL TARTRATE 5 MG/5ML IV SOLN: 2.0000 mg | INTRAVENOUS | Status: DC | PRN

## 2021-06-18 MED ORDER — SUCCINYLCHOLINE CHLORIDE 200 MG/10ML IV SOSY
PREFILLED_SYRINGE | INTRAVENOUS | Status: AC
  Filled 2021-06-18: qty 10

## 2021-06-18 MED ORDER — FENTANYL CITRATE (PF) 100 MCG/2ML IJ SOLN: 25.0000 ug | INTRAMUSCULAR | Status: DC | PRN

## 2021-06-18 MED ORDER — LABETALOL HCL 5 MG/ML IV SOLN: 10.0000 mg | INTRAVENOUS | Status: DC | PRN

## 2021-06-18 MED ORDER — HEPARIN (PORCINE) IN NACL 1000-0.9 UT/500ML-% IV SOLN
INTRAVENOUS | Status: DC | PRN
  Administered 2021-06-18 (×2): 500 mL

## 2021-06-18 MED ORDER — HYDRALAZINE HCL 20 MG/ML IJ SOLN: 5.0000 mg | INTRAMUSCULAR | Status: DC | PRN

## 2021-06-18 MED ORDER — SODIUM CHLORIDE 0.9 % IV SOLN: 250.0000 mL | INTRAVENOUS | Status: DC | PRN

## 2021-06-18 MED ORDER — HYDROMORPHONE HCL 1 MG/ML IJ SOLN: 0.5000 mg | INTRAMUSCULAR | Status: DC | PRN

## 2021-06-18 MED ORDER — LIDOCAINE 2% (20 MG/ML) 5 ML SYRINGE
INTRAMUSCULAR | Status: AC
  Filled 2021-06-18: qty 5

## 2021-06-18 MED ORDER — ONDANSETRON HCL 4 MG/2ML IJ SOLN
INTRAMUSCULAR | Status: DC | PRN
  Administered 2021-06-18: 4 mg via INTRAVENOUS

## 2021-06-18 MED ORDER — DEXAMETHASONE SODIUM PHOSPHATE 10 MG/ML IJ SOLN
INTRAMUSCULAR | Status: AC
  Filled 2021-06-18: qty 1

## 2021-06-18 MED ORDER — FENTANYL CITRATE (PF) 250 MCG/5ML IJ SOLN
INTRAMUSCULAR | Status: DC | PRN
  Administered 2021-06-18: 25 ug via INTRAVENOUS
  Administered 2021-06-18 (×4): 50 ug via INTRAVENOUS
  Administered 2021-06-18 (×2): 25 ug via INTRAVENOUS
  Administered 2021-06-18: 50 ug via INTRAVENOUS

## 2021-06-18 MED ORDER — CEFAZOLIN SODIUM-DEXTROSE 2-4 GM/100ML-% IV SOLN: 2.0000 g | Freq: Three times a day (TID) | INTRAVENOUS | Status: DC

## 2021-06-18 MED ORDER — LIDOCAINE HCL (CARDIAC) PF 100 MG/5ML IV SOSY
PREFILLED_SYRINGE | INTRAVENOUS | Status: DC | PRN
  Administered 2021-06-18: 60 mg via INTRATRACHEAL

## 2021-06-18 MED ORDER — ROCURONIUM BROMIDE 100 MG/10ML IV SOLN
INTRAVENOUS | Status: DC | PRN
  Administered 2021-06-18: 30 mg via INTRAVENOUS
  Administered 2021-06-18: 70 mg via INTRAVENOUS

## 2021-06-18 MED ORDER — DOCUSATE SODIUM 100 MG PO CAPS
100.0000 mg | ORAL_CAPSULE | Freq: Every day | ORAL | Status: DC
  Administered 2021-06-21 – 2021-06-23 (×2): 100 mg via ORAL
  Filled 2021-06-18 (×6): qty 1

## 2021-06-18 MED ORDER — OXYCODONE HCL 5 MG/5ML PO SOLN: 5.0000 mg | Freq: Once | ORAL | Status: DC | PRN

## 2021-06-18 MED ORDER — ROCURONIUM BROMIDE 10 MG/ML (PF) SYRINGE
PREFILLED_SYRINGE | INTRAVENOUS | Status: AC
  Filled 2021-06-18: qty 10

## 2021-06-18 MED ORDER — PANTOPRAZOLE SODIUM 40 MG PO TBEC
40.0000 mg | DELAYED_RELEASE_TABLET | Freq: Every day | ORAL | Status: DC
  Administered 2021-06-18 – 2021-06-27 (×10): 40 mg via ORAL
  Filled 2021-06-18 (×11): qty 1

## 2021-06-18 MED ORDER — GUAIFENESIN-DM 100-10 MG/5ML PO SYRP: 15.0000 mL | ORAL_SOLUTION | ORAL | Status: DC | PRN

## 2021-06-18 MED ORDER — LABETALOL HCL 5 MG/ML IV SOLN
INTRAVENOUS | Status: AC
  Filled 2021-06-18: qty 4

## 2021-06-18 MED ORDER — HEPARIN SODIUM (PORCINE) 1000 UNIT/ML IJ SOLN
INTRAMUSCULAR | Status: DC | PRN
  Administered 2021-06-18: 10000 [IU] via INTRAVENOUS
  Administered 2021-06-18: 5000 [IU] via INTRAVENOUS

## 2021-06-18 MED ORDER — MAGNESIUM SULFATE 2 GM/50ML IV SOLN: 2.0000 g | Freq: Every day | INTRAVENOUS | Status: DC | PRN

## 2021-06-18 MED ORDER — LABETALOL HCL 5 MG/ML IV SOLN
10.0000 mg | INTRAVENOUS | Status: DC | PRN
  Administered 2021-06-18 (×2): 10 mg via INTRAVENOUS
  Filled 2021-06-18: qty 4

## 2021-06-18 MED ORDER — HYDRALAZINE HCL 20 MG/ML IJ SOLN
INTRAMUSCULAR | Status: AC
  Filled 2021-06-18: qty 1

## 2021-06-18 MED ORDER — PROPOFOL 10 MG/ML IV BOLUS
INTRAVENOUS | Status: AC
  Filled 2021-06-18: qty 20

## 2021-06-18 MED ORDER — SODIUM CHLORIDE 0.9 % IV SOLN: 500.0000 mL | Freq: Once | INTRAVENOUS | Status: DC | PRN

## 2021-06-18 MED ORDER — SODIUM CHLORIDE 0.9% FLUSH
3.0000 mL | Freq: Two times a day (BID) | INTRAVENOUS | Status: DC
  Administered 2021-06-18 – 2021-06-26 (×15): 3 mL via INTRAVENOUS

## 2021-06-18 MED ORDER — POTASSIUM CHLORIDE CRYS ER 20 MEQ PO TBCR: 20.0000 meq | EXTENDED_RELEASE_TABLET | Freq: Every day | ORAL | Status: DC | PRN

## 2021-06-18 MED ORDER — PHENYLEPHRINE HCL-NACL 10-0.9 MG/250ML-% IV SOLN
INTRAVENOUS | Status: DC | PRN
  Administered 2021-06-18: 50 ug/min via INTRAVENOUS

## 2021-06-18 MED ORDER — IODIXANOL 320 MG/ML IV SOLN
INTRAVENOUS | Status: DC | PRN
  Administered 2021-06-18: 97 mL via INTRA_ARTERIAL

## 2021-06-18 MED ORDER — PHENOL 1.4 % MT LIQD: 1.0000 | OROMUCOSAL | Status: DC | PRN

## 2021-06-18 MED ORDER — HYDRALAZINE HCL 20 MG/ML IJ SOLN
INTRAMUSCULAR | Status: DC | PRN
  Administered 2021-06-18: 10 mg via INTRAVENOUS

## 2021-06-18 MED ORDER — ACETAMINOPHEN 500 MG PO TABS: 1000.0000 mg | ORAL_TABLET | Freq: Once | ORAL | Status: DC | PRN

## 2021-06-18 SURGICAL SUPPLY — 79 items
ADH SKN CLS APL DERMABOND .7 (GAUZE/BANDAGES/DRESSINGS) ×2
AGENT HMST SPONGE THK3/8 (HEMOSTASIS)
BAG COUNTER SPONGE SURGICOUNT (BAG) ×3 IMPLANT
BAG SPNG CNTER NS LX DISP (BAG) ×2
BANDAGE ESMARK 6X9 LF (GAUZE/BANDAGES/DRESSINGS) IMPLANT
BNDG CMPR 9X6 STRL LF SNTH (GAUZE/BANDAGES/DRESSINGS)
BNDG ELASTIC 4X5.8 VLCR STR LF (GAUZE/BANDAGES/DRESSINGS) ×3 IMPLANT
BNDG ESMARK 6X9 LF (GAUZE/BANDAGES/DRESSINGS)
BNDG GAUZE ELAST 4 BULKY (GAUZE/BANDAGES/DRESSINGS) ×3 IMPLANT
CANISTER SUCT 3000ML PPV (MISCELLANEOUS) ×3 IMPLANT
CANNULA VESSEL 3MM 2 BLNT TIP (CANNULA) ×6 IMPLANT
CLIP VESOCCLUDE MED 24/CT (CLIP) ×3 IMPLANT
CLIP VESOCCLUDE SM WIDE 24/CT (CLIP) ×3 IMPLANT
COVER SURGICAL LIGHT HANDLE (MISCELLANEOUS) ×4 IMPLANT
CUFF TOURN SGL QUICK 24 (TOURNIQUET CUFF)
CUFF TOURN SGL QUICK 34 (TOURNIQUET CUFF)
CUFF TOURN SGL QUICK 42 (TOURNIQUET CUFF) IMPLANT
CUFF TRNQT CYL 24X4X16.5-23 (TOURNIQUET CUFF) IMPLANT
CUFF TRNQT CYL 34X4.125X (TOURNIQUET CUFF) IMPLANT
DERMABOND ADVANCED (GAUZE/BANDAGES/DRESSINGS) ×1
DERMABOND ADVANCED .7 DNX12 (GAUZE/BANDAGES/DRESSINGS) ×2 IMPLANT
DRAIN HEMOVAC 1/8 X 5 (WOUND CARE) IMPLANT
DRAPE EXTREMITY T 121X128X90 (DISPOSABLE) ×2 IMPLANT
DRAPE HALF SHEET 40X57 (DRAPES) ×2 IMPLANT
DRAPE INCISE 23X17 IOBAN STRL (DRAPES) ×1
DRAPE INCISE 23X17 STRL (DRAPES) IMPLANT
DRAPE INCISE IOBAN 23X17 STRL (DRAPES) ×2 IMPLANT
DRAPE X-RAY CASS 24X20 (DRAPES) IMPLANT
DRSG COVADERM 4X10 (GAUZE/BANDAGES/DRESSINGS) ×1 IMPLANT
DRSG COVADERM 4X6 (GAUZE/BANDAGES/DRESSINGS) ×1 IMPLANT
DRSG CURAD 3X16 NADH (PACKING) ×1 IMPLANT
DRSG EMULSION OIL 3X3 NADH (GAUZE/BANDAGES/DRESSINGS) ×3 IMPLANT
DRSG TELFA 3X8 NADH (GAUZE/BANDAGES/DRESSINGS) ×3 IMPLANT
ELECT REM PT RETURN 9FT ADLT (ELECTROSURGICAL) ×3
ELECTRODE REM PT RTRN 9FT ADLT (ELECTROSURGICAL) ×2 IMPLANT
EVACUATOR SILICONE 100CC (DRAIN) IMPLANT
GAUZE 4X4 16PLY ~~LOC~~+RFID DBL (SPONGE) ×3 IMPLANT
GAUZE SPONGE 4X4 12PLY STRL (GAUZE/BANDAGES/DRESSINGS) ×3 IMPLANT
GAUZE XEROFORM 5X9 LF (GAUZE/BANDAGES/DRESSINGS) ×1 IMPLANT
GLOVE SURG ENC MOIS LTX SZ7.5 (GLOVE) ×3 IMPLANT
GLOVE SURG NEOP MICRO LF SZ7.5 (GLOVE) ×1 IMPLANT
GLOVE SURG UNDER POLY LF SZ6.5 (GLOVE) ×2 IMPLANT
GOWN STRL REUS W/ TWL LRG LVL3 (GOWN DISPOSABLE) ×6 IMPLANT
GOWN STRL REUS W/TWL LRG LVL3 (GOWN DISPOSABLE) ×9
HEMOSTAT SPONGE AVITENE ULTRA (HEMOSTASIS) IMPLANT
KIT BASIN OR (CUSTOM PROCEDURE TRAY) ×3 IMPLANT
KIT TURNOVER KIT B (KITS) ×3 IMPLANT
NS IRRIG 1000ML POUR BTL (IV SOLUTION) ×6 IMPLANT
PACK PERIPHERAL VASCULAR (CUSTOM PROCEDURE TRAY) ×3 IMPLANT
PAD ARMBOARD 7.5X6 YLW CONV (MISCELLANEOUS) ×6 IMPLANT
PAD DRESSING TELFA 3X8 NADH (GAUZE/BANDAGES/DRESSINGS) IMPLANT
SET COLLECT BLD 21X3/4 12 (NEEDLE) IMPLANT
SPECIMEN JAR SMALL (MISCELLANEOUS) ×3 IMPLANT
SPONGE T-LAP 18X18 ~~LOC~~+RFID (SPONGE) ×3 IMPLANT
STOPCOCK 4 WAY LG BORE MALE ST (IV SETS) IMPLANT
SUT ETHILON 3 0 FSL (SUTURE) ×2 IMPLANT
SUT ETHILON 3 0 PS 1 (SUTURE) ×7 IMPLANT
SUT PROLENE 5 0 C 1 24 (SUTURE) ×3 IMPLANT
SUT PROLENE 6 0 CC (SUTURE) ×5 IMPLANT
SUT PROLENE 7 0 BV 1 (SUTURE) ×5 IMPLANT
SUT PROLENE 7 0 BV1 MDA (SUTURE) IMPLANT
SUT SILK 2 0 PERMA HAND 18 BK (SUTURE) ×3 IMPLANT
SUT SILK 2 0 SH (SUTURE) ×3 IMPLANT
SUT SILK 3 0 (SUTURE) ×6
SUT SILK 3-0 18XBRD TIE 12 (SUTURE) IMPLANT
SUT VIC AB 2-0 SH 27 (SUTURE) ×6
SUT VIC AB 2-0 SH 27XBRD (SUTURE) ×4 IMPLANT
SUT VIC AB 3-0 SH 27 (SUTURE) ×21
SUT VIC AB 3-0 SH 27X BRD (SUTURE) ×8 IMPLANT
SUT VIC AB 4-0 PS2 27 (SUTURE) ×2 IMPLANT
SUT VICRYL 4-0 PS2 18IN ABS (SUTURE) ×7 IMPLANT
TAPE UMBILICAL COTTON 1/8X30 (MISCELLANEOUS) ×1 IMPLANT
TOWEL GREEN STERILE (TOWEL DISPOSABLE) ×6 IMPLANT
TOWEL GREEN STERILE FF (TOWEL DISPOSABLE) ×3 IMPLANT
TRAY FOLEY MTR SLVR 16FR STAT (SET/KITS/TRAYS/PACK) ×3 IMPLANT
TUBE CONNECTING 12X1/4 (SUCTIONS) ×1 IMPLANT
TUBING EXTENTION W/L.L. (IV SETS) IMPLANT
UNDERPAD 30X36 HEAVY ABSORB (UNDERPADS AND DIAPERS) ×3 IMPLANT
WATER STERILE IRR 1000ML POUR (IV SOLUTION) ×3 IMPLANT

## 2021-06-18 SURGICAL SUPPLY — 8 items
CATH ANGIO 5F PIGTAIL 65CM (CATHETERS) ×1 IMPLANT
KIT PV (KITS) ×2 IMPLANT
SHEATH PINNACLE 5F 10CM (SHEATH) ×1 IMPLANT
SHEATH PROBE COVER 6X72 (BAG) ×1 IMPLANT
SYR MEDRAD MARK V 150ML (SYRINGE) ×1 IMPLANT
TRANSDUCER W/STOPCOCK (MISCELLANEOUS) ×2 IMPLANT
TRAY PV CATH (CUSTOM PROCEDURE TRAY) ×2 IMPLANT
WIRE HITORQ VERSACORE ST 145CM (WIRE) ×1 IMPLANT
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
## 2021-06-18 NOTE — Anesthesia Procedure Notes (Signed)
Arterial Line Insertion Start/End7/07/2021 12:00 PM, 06/18/2021 12:10 AM Performed by: Lance Coon, CRNA, CRNA  Preanesthetic checklist: patient identified, IV checked, site marked, risks and benefits discussed, surgical consent, monitors and equipment checked, pre-op evaluation, timeout performed and anesthesia consent Lidocaine 1% used for infiltration Right, radial was placed Catheter size: 20 G Hand hygiene performed , maximum sterile barriers used  and Seldinger technique used  Attempts: 1 Procedure performed without using ultrasound guided technique. Following insertion, dressing applied and Biopatch. Post procedure assessment: normal and unchanged  Patient tolerated the procedure well with no immediate complications.

## 2021-06-18 NOTE — Progress Notes (Signed)
Patient left foot oozing and dripping serosanguineous drainage after I recently reinforced dressing. Dr. Luan Pulling notified.

## 2021-06-18 NOTE — Transfer of Care (Signed)
Immediate Anesthesia Transfer of Care Note  Patient: [name]  Procedure(s) Performed: Left below-knee popliteal to posterior tibial artery bypass with 9 reversed left greater saphenous vein (Left: Leg Lower) Amputation transmetatarsal of toes 2 3 and 4 left foot  (Left: Foot)  Patient Location: PACU  Anesthesia Type:General  Level of Consciousness: awake, alert  and pateint uncooperative  Airway & Oxygen Therapy: Patient Spontanous Breathing and Patient connected to face mask oxygen  Post-op Assessment: Report given to RN, Post -op Vital signs reviewed and stable and Patient moving all extremities X 4  Post vital signs: Reviewed and stable  Last Vitals:  Vitals Value Taken Time  BP 171/70 06/18/21 1834  Temp    Pulse 69 06/18/21 1842  Resp 20 06/18/21 1842  SpO2 100 % 06/18/21 1842  Vitals shown include unvalidated device data.  Last Pain:  Vitals:   06/18/21 1136  TempSrc:   PainSc: 8       Patients Stated Pain Goal: 3 (28/76/81 1572)  Complications: No notable events documented.

## 2021-06-18 NOTE — Anesthesia Procedure Notes (Signed)
Procedure Name: Intubation Date/Time: 06/18/2021 1:12 PM Performed by: Lance Coon, CRNA Pre-anesthesia Checklist: Patient identified, Emergency Drugs available, Suction available, Patient being monitored and Timeout performed Patient Re-evaluated:Patient Re-evaluated prior to induction Oxygen Delivery Method: Circle system utilized Preoxygenation: Pre-oxygenation with 100% oxygen Induction Type: IV induction Ventilation: Mask ventilation without difficulty Laryngoscope Size: Mac and 4 Grade View: Grade I Tube type: Oral Tube size: 7.5 mm Number of attempts: 1 Airway Equipment and Method: Stylet Placement Confirmation: ETT inserted through vocal cords under direct vision, positive ETCO2 and breath sounds checked- equal and bilateral Secured at: 22 cm Tube secured with: Tape Dental Injury: Teeth and Oropharynx as per pre-operative assessment  Comments: Airway per EMT student

## 2021-06-18 NOTE — Interval H&P Note (Signed)
History and Physical Interval Note:  06/18/2021 8:53 AM  [name]  has presented today for surgery, with the diagnosis of grangren.  The various methods of treatment have been discussed with the patient and family. After consideration of risks, benefits and other options for treatment, the patient has consented to  Procedure(s): ABDOMINAL AORTOGRAM W/LOWER EXTREMITY (N/A) as a surgical intervention.  The patient's history has been reviewed, patient examined, no change in status, stable for surgery.  I have reviewed the patient's chart and labs.  Questions were answered to the patient's satisfaction.     Ruta Hinds

## 2021-06-18 NOTE — Anesthesia Postprocedure Evaluation (Signed)
Anesthesia Post Note  Patient: [name]  Procedure(s) Performed: Left below-knee popliteal to posterior tibial artery bypass with 9 reversed left greater saphenous vein (Left: Leg Lower) Amputation transmetatarsal of toes 2 3 and 4 left foot  (Left: Foot)     Patient location during evaluation: PACU Anesthesia Type: General Level of consciousness: awake and alert Pain management: pain level controlled Vital Signs Assessment: post-procedure vital signs reviewed and stable Respiratory status: spontaneous breathing, nonlabored ventilation, respiratory function stable and patient connected to nasal cannula oxygen Cardiovascular status: blood pressure returned to baseline and stable Postop Assessment: no apparent nausea or vomiting Anesthetic complications: no   No notable events documented.  Last Vitals:  Vitals:   06/18/21 1834 06/18/21 1919  BP: (!) 171/70 (!) 132/59  Pulse: 72 68  Resp: 11 19  Temp:    SpO2: 100% 100%    Last Pain:  Vitals:   06/18/21 1834  TempSrc:   PainSc: 0-No pain                 Jerilynn Feldmeier S

## 2021-06-18 NOTE — Op Note (Signed)
Procedure: Abdominal aortogram with bilateral lower extremity runoff  Preoperative diagnosis: Gangrene left foot  Postoperative diagnosis: Same  Anesthesia: Local  Operative findings: 1.  Severe tibial artery occlusive disease one-vessel posterior tibial runoff at the ankle left foot  2.  Patent popliteal to right posterior tibial vein bypass with mild right superficial femoral artery occlusive disease inflow to this with several areas of 40 to 50% stenosis.  3.  No significant aortoiliac occlusive disease  Operative details: After obtaining informed consent, the patient was taken to the [facility] lab.  The patient was placed in supine position angio table.  Both groins were prepped and draped in usual sterile fashion.  Local anesthesia was infiltrated over the right common femoral artery.  Right common femoral artery femoral bifurcation were identified with ultrasound.  An introducer needle was used to cannulate the right common femoral artery and an 035 versa core wire threaded up the abdominal aorta under fluoroscopic guidance.  Next a 5 French sheath placed over the guidewire in the right common femoral artery.  This was thoroughly flushed with heparinized saline.  5 French pigtail catheter was advanced over the guidewire to the abdominal aorta and abdominal aortogram obtained in AP projection.  Left and right renal arteries are patent.  The infrarenal abdominal aorta left and right common external and internal iliac arteries are all widely patent.  Next pigtail catheter was pulled down just above the aortic bifurcation and bilateral lower extremity runoff views were obtained.  In the right lower extremity, the right common femoral profunda femoris is patent.  There is some mid superficial femoral artery areas of calcification and stenosis providing 40 to 50% areas of stenosis diffusely.  There is a bypass graft which originates off the below-knee popliteal artery with its distal anastomosis to the  posterior tibial artery at the ankle.  This is widely patent.  This is the only runoff vessel to the right foot.  In the left lower extremity the left common femoral artery and profunda femoris is patent.  The left superficial femoral artery is patent in its proximal third and then has several areas of diffuse calcification with 40 to 60% stenosis.  The below-knee popliteal artery is patent.  The anterior tibial artery is occluded.  The peroneal artery is severely diseased with multiple segments of short occlusion but does reconstitute distally and fills the posterior tibial artery at the ankle as well as the anterior tibial artery at the ankle.  At this point the 5 French pigtail catheter was removed over guidewire and the 5 French sheath left in place to be pulled in the holding area.  Operative management: The patient will be scheduled for a left femoral to posterior tibial artery bypass versus left superficial femoral artery stent and popliteal to posterior tibial artery bypass later today.  We will amputate several of his toes at that time.  Risk benefits possible complications and procedure details were discussed with the patient today.  He understands and agrees to proceed.  Ruta Hinds, MD [facility] Office: [phone]

## 2021-06-18 NOTE — Interval H&P Note (Signed)
History and Physical Interval Note:  06/18/2021 12:33 PM  [name]  has presented today for surgery, with the diagnosis of Peripheral Artery Disease.  The various methods of treatment have been discussed with the patient and family. After consideration of risks, benefits and other options for treatment, the patient has consented to  Procedure(s): BYPASS GRAFT LEFT FEMORAL TO POSTERIOR TIBIAL ARTERY, POSSIBLE SFA STENT (Left) AMPUTATION LEFT TOES (Left) as a surgical intervention.  The patient's history has been reviewed, patient examined, no change in status, stable for surgery.  I have reviewed the patient's chart and labs.  Questions were answered to the patient's satisfaction.     Ruta Hinds
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
## 2021-06-18 NOTE — Progress Notes (Signed)
Site area: right groin  Site Prior to Removal:  Level 0  Pressure Applied For 20 MINUTES    Minutes Beginning at 1025  Manual:   Yes.    Patient Status During Pull:  Stable  Post Pull Groin Site:  Level 0  Post Pull Instructions Given:  Yes.    Post Pull Pulses Present:  Yes.    Dressing Applied:  Yes.    Comments:  Bed rest started at 1045 X 4 hr

## 2021-06-18 NOTE — Progress Notes (Signed)
PHARMACY NOTE:  ANTIMICROBIAL RENAL DOSAGE ADJUSTMENT  Current antimicrobial regimen includes a mismatch between antimicrobial dosage and estimated renal function.  As per policy approved by the Pharmacy & Therapeutics and Medical Executive Committees, the antimicrobial dosage will be adjusted accordingly.  Current antimicrobial dosage:  Cefazolin 2 gm IV Q 8 hrs X 2 doses post op  Indication:  Post-op surgical prophylaxis  Renal Function:  Estimated Creatinine Clearance: 10.2 mL/min (A) (by C-G formula based on SCr of 7.7 mg/dL (H)). [x]      On intermittent HD, scheduled: TTS []      On CRRT    Antimicrobial dosage has been changed to:  Cefazolin post-op doses have been discontinued. Pt rec'd cefazolin 2 gm IV X 1 pre op at 1327 PM today; next HD session planned for tomorrow, 06/19/21  Additional comments: Pt also on vancomycin and cefepime with HD  Thank you for allowing pharmacy to be a part of this patient's care.  Gillermina Hu, PharmD, BCPS, [facility] Clinical Pharmacist 06/18/2021 8:35 PM

## 2021-06-18 NOTE — Progress Notes (Signed)
PT Cancellation Note  Patient Details Name: [name] MRN: [id number] DOB: 30-Dec-1950   Cancelled Treatment:    Reason Eval/Treat Not Completed: Patient at procedure or test/unavailable.  Pt is in cath lab.  PT to check back later as time allows.   Thanks, Verdene Lennert, PT, DPT  Acute Rehabilitation Ortho Tech Supervisor [phone] pager #[phone] office      Wells Guiles B Ishitha Roper 06/18/2021, 9:23 AM

## 2021-06-18 NOTE — Anesthesia Preprocedure Evaluation (Addendum)
Anesthesia Evaluation  Patient identified by MRN, date of birth, ID band Patient awake    Reviewed: Allergy & Precautions, NPO status , Patient's Chart, lab work & pertinent test results  History of Anesthesia Complications Negative for: history of anesthetic complications  Airway Mallampati: II  TM Distance: >3 FB Neck ROM: Full    Dental  (+) Dental Advisory Given   Pulmonary asthma , former smoker,  Covid-19 Nucleic Acid Test Results Lab Results      Component                Value               Date                      SARSCOV2NAA              NEGATIVE            06/14/2021              breath sounds clear to auscultation       Cardiovascular hypertension, Pt. on medications (-) angina+ Peripheral Vascular Disease and +CHF  + dysrhythmias  Rhythm:Irregular  1. Left ventricular ejection fraction, by estimation, is 60 to 65%. The  left ventricle has normal function. The left ventricle has no regional  wall motion abnormalities. Left ventricular diastolic parameters are  indeterminate.  2. Right ventricular systolic function was not well visualized. The right  ventricular size is not well visualized.  3. Left atrial size was mild to moderately dilated.  4. The mitral valve is normal in structure. Trivial mitral valve  regurgitation. No evidence of mitral stenosis.  5. The aortic valve is calcified. There is moderate calcification of the  aortic valve. Aortic valve regurgitation is not visualized. Mild to  moderate aortic valve sclerosis/calcification is present, without any  evidence of aortic stenosis.  6. The inferior vena cava is normal in size with greater than 50%  respiratory variability, suggesting right atrial pressure of 3 mmHg.    Neuro/Psych negative neurological ROS  negative psych ROS   GI/Hepatic Neg liver ROS, hiatal hernia, GERD  ,  Endo/Other  diabetes  Renal/GU ESRF and DialysisRenal diseaseLab  Results      Component                Value               Date                      CREATININE               6.80 (H)            06/18/2021           Lab Results      Component                Value               Date                      K                        4.3                 06/18/2021                Musculoskeletal  (+)  Arthritis ,   Abdominal   Peds  Hematology  (+) Blood dyscrasia, anemia , Lab Results      Component                Value               Date                      WBC                      11.9 (H)            06/18/2021                HGB                      9.4 (L)             06/18/2021                HCT                      29.0 (L)            06/18/2021                MCV                      90.9                06/18/2021                PLT                      137 (L)             06/18/2021              Anesthesia Other Findings   Reproductive/Obstetrics                            Anesthesia Physical Anesthesia Plan  ASA: 3  Anesthesia Plan: General   Post-op Pain Management:    Induction: Intravenous  PONV Risk Score and Plan: 2 and Ondansetron and Dexamethasone  Airway Management Planned: Oral ETT  Additional Equipment: Arterial line  Intra-op Plan:   Post-operative Plan: Extubation in OR  Informed Consent: I have reviewed the patients History and Physical, chart, labs and discussed the procedure including the risks, benefits and alternatives for the proposed anesthesia with the patient or authorized representative who has indicated his/her understanding and acceptance.     Dental advisory given  Plan Discussed with: Surgeon  Anesthesia Plan Comments:        Anesthesia Quick Evaluation

## 2021-06-18 NOTE — Op Note (Signed)
Procedure: Left below-knee popliteal to posterior tibial artery bypass with 9 reversed left greater saphenous vein  Amputation transmetatarsal of toes 2 3 and 4 left foot  Preoperative diagnosis: Gangrene left foot  Postoperative diagnosis: Same  Anesthesia: General  Assistant: Gae Gallop, MD Arlee Muslim, PA-C to expedite procedure provide exposure and creation of anastomosis  Operative findings: #1 sclerotic partially calcified greater saphenous vein  2.  Good quality popliteal pulse and good outflow vessel  Operative details: After obtaining informed consent, the patient was taken the operating.  The patient was placed in supine position operating table.  After induction of general anesthesia and endotracheal intubation a Foley catheter was placed.  Next the patient's entire left lower extremity was prepped and draped in usual sterile fashion.  A longitudinal incision was then made over the medial aspect of the left calf carried down to the subcutaneous tissues down the level of the saphenous vein.  It was about 3 mm in diameter at this location.  It was dissected free circumferentially and reflected posteriorly.  Incision was then deepened into the below-knee popliteal space and the popliteal vein reflected posteriorly.  The popliteal artery was then identified and dissected free circumferentially.  It had a very good quality pulse within it and was essentially the same pressure as the patient's systemic arm pressure so I did not feel he needed an intervention in his superficial femoral artery and I felt this would be an adequate inflow vessel.  Vessel loop was placed around this.  I then proceeded to dissect out the greater saphenous vein from the below-knee incision all the way up to the saphenofemoral junction.  The vein had inflammatory tissue around it and was thickened.  This made dissection a little bit more tedious.  Side branches were ligated and divided between silk ties or clips.   The distal saphenous vein was ligated with a 2-0 silk tie as well as the saphenofemoral junction.  The vein was gently distended with heparinized saline and overall was about 3 mm at its distal end and 3-1/2 mm at its proximal end.  It was thickened and had probably had some previous thrombophlebitis within it and did have some calcified segments but it did not seem to have any significant luminal narrowing.  At this point a longitudinal incision was made at the left ankle area to expose the posterior tibial artery.  The incision was carried on through the subcutaneous tissues which were quite inflamed and thickened.  The fascia was opened and the posterior tibial artery was fairly small about 2 mm diameter but it was soft on palpation and not heavily calcified.  It was dissected free circumferentially and Vesseloops placed around it.  I then created a tunnel from the subfascial space at the ankle up through the fibers of the soleus muscle to the below-knee popliteal space.  The patient was then given 10,000 units of intravenous heparin.  He was given an additional 5000 units of heparin during the course of the case.  Popliteal artery was controlled proximally distally with Henley clamps.  A longitudinal opening was made in the popliteal artery the vein was placed in a nonreversed configuration and sewn end of vein to side of artery using a running 6-0 Prolene suture.  Just prior to completion of the anastomosis it was fore bled backbled and thoroughly flushed.  The anastomosis was secured clamps released there was a good pulse within the graft immediately.  Hemostasis was obtained.  Valvulotome was then passed  through the vein graft to lyse all of the valves and there was good quality flow through the bypass graft at this point.  It was then marked for orientation and brought through the tunnel.  The vein graft was then controlled distally with fine bulldog clamp and the artery was controlled proximally and  distally with fine bulldog clamps.  Longitudinal opening was made in the posterior tibial artery the vein graft was cut to length and spatulated and sewn end of vein to side of artery using a running 7-0 Prolene suture.  Just prior to completion of anastomosis it was for bled backbled and thoroughly flushed.  Anastomosis was secured Vesseloops were released clamps were released and there was good flow in the posterior tibial artery immediately.  This augmented by about 50% with clamping unclamping of the graft.  A couple of repair stitches were placed.  Hemostasis was obtained.  The subcutaneous tissues and skin were closed as a unit due to the patient's very thickened skin this was done with interrupted 3 0 vertical mattress and simple nylon sutures.  The other incisions were then closed with multiple layers of running 3-0 Vicryl suture in the subcutaneous layers and 4-0 Vicryl subcuticular stitch in the skin.  Dermabond was applied to all of these upper skin incisions.  The lower nylon closed incision was covered with a sterile dressing.  I then proceeded to do a transmetatarsal amputation of toes 2 3 and 4 of the left foot.  Longitudinal circumferential incision was made around the base of all 3 toes and this segment of bone transected through all of the distal phalanx bones.  The distal phalanx bones were then completely removed with rongeurs as well as the resection of the metatarsal head of all 3 of these digits.  The wound was thoroughly irrigated with normal saline solution.  There was good bleeding from the skin edges.  The skin edges again were quite thickened from the patient's skin changes from probably lymphedema and venous stasis.  Therefore the skin edges were only loosely approximated using interrupted 3 0 vertical mattress and simple nylon sutures.  The patient tolerated the procedure well and there were no complications.  The instrument sponge and needle count was correct at the end of the case.   The patient was taken the recovery room in stable condition.  Ruta Hinds, MD [facility] Office: [phone]

## 2021-06-18 NOTE — Progress Notes (Signed)
Left lower extremity vein mapping completed. Refer to "CV Proc" under chart review to view preliminary results.  06/18/2021 11:58 AM Kelby Aline., MHA, RVT, RDCS, RDMS

## 2021-06-19 ENCOUNTER — Encounter (HOSPITAL_COMMUNITY): Payer: Self-pay | Admitting: Vascular Surgery

## 2021-06-19 DIAGNOSIS — L0390 Cellulitis, unspecified: Secondary | ICD-10-CM | POA: Diagnosis not present

## 2021-06-19 DIAGNOSIS — A419 Sepsis, unspecified organism: Secondary | ICD-10-CM | POA: Diagnosis not present

## 2021-06-19 LAB — BASIC METABOLIC PANEL
Anion gap: 9 (ref 5–15)
BUN: 33 mg/dL — ABNORMAL HIGH (ref 8–23)
CO2: 23 mmol/L (ref 22–32)
Calcium: 9.1 mg/dL (ref 8.9–10.3)
Chloride: 99 mmol/L (ref 98–111)
Creatinine, Ser: 8.17 mg/dL — ABNORMAL HIGH (ref 0.61–1.24)
GFR, Estimated: 6 mL/min — ABNORMAL LOW (ref >60)
Glucose, Bld: 239 mg/dL — ABNORMAL HIGH (ref 70–99)
Potassium: 5.3 mmol/L — ABNORMAL HIGH (ref 3.5–5.1)
Sodium: 131 mmol/L — ABNORMAL LOW (ref 135–145)

## 2021-06-19 LAB — LIPID PANEL
Cholesterol: 96 mg/dL (ref 0–200)
HDL: 19 mg/dL — ABNORMAL LOW (ref >40)
LDL Cholesterol: 56 mg/dL (ref 0–99)
Total CHOL/HDL Ratio: 5.1 RATIO
Triglycerides: 105 mg/dL (ref <150)
VLDL: 21 mg/dL (ref 0–40)

## 2021-06-19 LAB — CBC
HCT: 27.3 % — ABNORMAL LOW (ref 39.0–52.0)
Hemoglobin: 8.7 g/dL — ABNORMAL LOW (ref 13.0–17.0)
MCH: 29.1 pg (ref 26.0–34.0)
MCHC: 31.9 g/dL (ref 30.0–36.0)
MCV: 91.3 fL (ref 80.0–100.0)
Platelets: 187 10*3/uL (ref 150–400)
RBC: 2.99 MIL/uL — ABNORMAL LOW (ref 4.22–5.81)
RDW: 17 % — ABNORMAL HIGH (ref 11.5–15.5)
WBC: 18.7 10*3/uL — ABNORMAL HIGH (ref 4.0–10.5)
nRBC: 0 % (ref 0.0–0.2)

## 2021-06-19 LAB — GLUCOSE, CAPILLARY
Glucose-Capillary: 218 mg/dL — ABNORMAL HIGH (ref 70–99)
Glucose-Capillary: 180 mg/dL — ABNORMAL HIGH (ref 70–99)
Glucose-Capillary: 165 mg/dL — ABNORMAL HIGH (ref 70–99)
Glucose-Capillary: 151 mg/dL — ABNORMAL HIGH (ref 70–99)

## 2021-06-19 MED ORDER — ATORVASTATIN CALCIUM 80 MG PO TABS
80.0000 mg | ORAL_TABLET | Freq: Every day | ORAL | Status: DC
  Administered 2021-06-20 – 2021-06-27 (×8): 80 mg via ORAL
  Filled 2021-06-19 (×9): qty 1

## 2021-06-19 MED ORDER — INSULIN GLARGINE 100 UNIT/ML ~~LOC~~ SOLN
5.0000 [IU] | Freq: Two times a day (BID) | SUBCUTANEOUS | Status: DC
  Administered 2021-06-19 – 2021-06-21 (×5): 5 [IU] via SUBCUTANEOUS
  Filled 2021-06-19 (×7): qty 0.05

## 2021-06-19 MED ORDER — VANCOMYCIN HCL IN DEXTROSE 1-5 GM/200ML-% IV SOLN
INTRAVENOUS | Status: AC
  Administered 2021-06-19: 1000 mg via INTRAVENOUS
  Filled 2021-06-19: qty 200

## 2021-06-19 MED ORDER — ONDANSETRON HCL 4 MG/2ML IJ SOLN
INTRAMUSCULAR | Status: AC
  Filled 2021-06-19: qty 2

## 2021-06-19 NOTE — Procedures (Signed)
Seen and examined on dialysis.  Procedure supervised.  Blood pressure 103/45 and HR 60. Left AVF in use.  Tolerating goal of 2 kg.     Claudia Desanctis, MD 06/19/2021 9:03 AM

## 2021-06-19 NOTE — Progress Notes (Signed)
PHARMACIST LIPID MONITORING   [name] is a 70 y.o. male admitted on 06/14/2021 with sepsis secondary to cellulitis.  Pharmacy has been consulted to optimize lipid-lowering therapy with the indication of secondary prevention for clinical ASCVD.  Recent Labs:  Lipid Panel (last 6 months):   Lab Results  Component Value Date   CHOL 96 06/19/2021   TRIG 105 06/19/2021   HDL 19 (L) 06/19/2021   CHOLHDL 5.1 06/19/2021   VLDL 21 06/19/2021   LDLCALC 56 06/19/2021    Hepatic function panel (last 6 months):   Lab Results  Component Value Date   AST 21 06/14/2021   ALT 13 06/14/2021   ALKPHOS 40 06/14/2021   BILITOT 0.9 06/14/2021    SCr (since admission):   Serum creatinine: 8.17 mg/dL (H) 06/19/21 0002 Estimated creatinine clearance: 9.6 mL/min (A)  Current therapy and lipid therapy tolerance Current lipid-lowering therapy: Atorvastatin 40 mg  Previous lipid-lowering therapies (if applicable): NA No documented or reported allergies or intolerances to lipid-lowering therapies.  Assessment:   Spoke to patient about the benefit of maximizing statin therapy. Reports no issues with Atorvastatin therapy thus far. AST/ALT WNL. Patient agrees with changes to lipid-lowering therapy.  Plan:    1.Statin intensity (high intensity recommended for all patients regardless of the LDL): Increase Atorvastatin to 80 mg.  2.Follow-up with:  Primary care provider - [name], [name]  3.Follow-up labs after discharge:  Changes in lipid therapy were made. Check a lipid panel in 8-12 weeks then annually.       Ursula Beath, PharmD 06/19/2021, 1:59 PM

## 2021-06-19 NOTE — Progress Notes (Signed)
Progress Note    06/19/2021 10:11 AM 1 Day Post-Op  Subjective: Currently complaining of dizziness secondary to hypotension.  Patient seen and evaluated in the inpatient hemodialysis unit.   Vitals:   06/19/21 0924 06/19/21 0951  BP: (!) 119/57 (!) 94/53  Pulse: 68 70  Resp:    Temp:    SpO2:      Physical Exam: General appearance: Awake, alert in no apparent distress Cardiac: Heart rate and rhythm are regular Respirations: Nonlabored Incisions: Left groin, thigh and lower leg incisions are all well approximated without bleeding or hematoma Extremities: Left foot motor sensation intact.  His surgical dressing is dry and intact. Pulse/Doppler exam: No functional Doppler unit on dialysis unit.  CBC    Component Value Date/Time   WBC 18.7 (H) 06/19/2021 0002   RBC 2.99 (L) 06/19/2021 0002   HGB 8.7 (L) 06/19/2021 0002   HGB 11.8 (L) 03/03/2017 1635   HGB 9.4 (L) 07/03/2014 0812   HCT 27.3 (L) 06/19/2021 0002   HCT 26.6 (L) 03/22/2017 1207   HCT 29.0 (L) 07/03/2014 0812   PLT 187 06/19/2021 0002   PLT 170 03/03/2017 1635   MCV 91.3 06/19/2021 0002   MCV 91 03/03/2017 1635   MCV 86.1 07/03/2014 0812   MCH 29.1 06/19/2021 0002   MCHC 31.9 06/19/2021 0002   RDW 17.0 (H) 06/19/2021 0002   RDW 16.9 (H) 03/03/2017 1635   RDW 16.3 (H) 07/03/2014 0812   LYMPHSABS 1.4 06/14/2021 1635   LYMPHSABS 1.5 07/03/2014 0812   MONOABS 1.3 (H) 06/14/2021 1635   MONOABS 0.7 07/03/2014 0812   EOSABS 0.2 06/14/2021 1635   EOSABS 0.6 (H) 07/03/2014 0812   BASOSABS 0.0 06/14/2021 1635   BASOSABS 0.1 07/03/2014 0812    BMET    Component Value Date/Time   NA 131 (L) 06/19/2021 0002   NA 138 03/03/2017 1635   NA 142 07/03/2014 0814   K 5.3 (H) 06/19/2021 0002   K 4.4 07/03/2014 0814   CL 99 06/19/2021 0002   CO2 23 06/19/2021 0002   CO2 20 (L) 07/03/2014 0814   GLUCOSE 239 (H) 06/19/2021 0002   GLUCOSE 136 07/03/2014 0814   BUN 33 (H) 06/19/2021 0002   BUN 36 (H) 03/03/2017  1635   BUN 93.6 (H) 07/03/2014 0814   CREATININE 8.17 (H) 06/19/2021 0002   CREATININE 7.3 (HH) 07/03/2014 0814   CALCIUM 9.1 06/19/2021 0002   CALCIUM 8.6 07/03/2014 0814   GFRNONAA 6 (L) 06/19/2021 0002   GFRNONAA 31 (L) 08/16/2012 1517   GFRAA 6 (L) 09/12/2018 0443   GFRAA 36 (L) 08/16/2012 1517     Intake/Output Summary (Last 24 hours) at 06/19/2021 1011 Last data filed at 06/19/2021 0335 Gross per 24 hour  Intake 1000 ml  Output 460 ml  Net 540 ml    HOSPITAL MEDICATIONS Scheduled Meds:  amLODipine  10 mg Oral Daily   aspirin EC  81 mg Oral Daily   atorvastatin  40 mg Oral Daily   Chlorhexidine Gluconate Cloth  6 each Topical Daily   Chlorhexidine Gluconate Cloth  6 each Topical Q0600   docusate sodium  100 mg Oral Daily   heparin  5,000 Units Subcutaneous Q8H   insulin aspart  0-6 Units Subcutaneous TID WC   insulin glargine  5 Units Subcutaneous BID   metroNIDAZOLE  500 mg Oral Q8H   pantoprazole  40 mg Oral Daily   sodium chloride flush  3 mL Intravenous Q12H   Continuous Infusions:  sodium chloride     sodium chloride     ceFEPime (MAXIPIME) IV Stopped (06/17/21 1355)   magnesium sulfate bolus IVPB     vancomycin     vancomycin Stopped (06/17/21 1515)   PRN Meds:.sodium chloride, sodium chloride, acetaminophen **OR** acetaminophen, guaiFENesin-dextromethorphan, hydrALAZINE, HYDROmorphone (DILAUDID) injection, labetalol, magnesium sulfate bolus IVPB, metoprolol tartrate, ondansetron **OR** ondansetron (ZOFRAN) IV, oxyCODONE, phenol, sodium chloride flush  Assessment and Plan: POD 1 Left below-knee popliteal to posterior tibial artery bypass with 9 reversed left greater saphenous vein and amputation transmetatarsal of toes 2 3 and 4 left foot.  Continue aspirin and statin.  We will take down left foot surgical dressing tomorrow.  Anemia: chronic and acute BLA: Drop in hemoglobin of approximately 1 g.  No evidence of ongoing bleeding or hematoma.  -DVT  prophylaxis:  heparin [place]   Risa Grill, PA-C [facility] [phone] 06/19/2021  10:11 AM
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
## 2021-06-19 NOTE — Progress Notes (Signed)
PT Cancellation Note  Patient Details Name: [name] MRN: [id number] DOB: 21-Jun-1951   Cancelled Treatment:    Reason Eval/Treat Not Completed: Patient at procedure or test/unavailable - HD, PT to check back.   Stacie Glaze, PT DPT Acute Rehabilitation Services Pager [phone]  Office [phone]    Louis Matte 06/19/2021, 8:47 AM

## 2021-06-20 ENCOUNTER — Encounter (HOSPITAL_COMMUNITY): Payer: Self-pay | Admitting: Family Medicine

## 2021-06-20 DIAGNOSIS — I5032 Chronic diastolic (congestive) heart failure: Secondary | ICD-10-CM | POA: Diagnosis not present

## 2021-06-20 DIAGNOSIS — I70213 Atherosclerosis of native arteries of extremities with intermittent claudication, bilateral legs: Secondary | ICD-10-CM | POA: Diagnosis not present

## 2021-06-20 DIAGNOSIS — L0390 Cellulitis, unspecified: Secondary | ICD-10-CM | POA: Diagnosis not present

## 2021-06-20 DIAGNOSIS — I480 Paroxysmal atrial fibrillation: Secondary | ICD-10-CM

## 2021-06-20 DIAGNOSIS — A419 Sepsis, unspecified organism: Secondary | ICD-10-CM | POA: Diagnosis not present

## 2021-06-20 DIAGNOSIS — E1121 Type 2 diabetes mellitus with diabetic nephropathy: Secondary | ICD-10-CM

## 2021-06-20 LAB — BASIC METABOLIC PANEL
Anion gap: 10 (ref 5–15)
BUN: 35 mg/dL — ABNORMAL HIGH (ref 8–23)
CO2: 28 mmol/L (ref 22–32)
Calcium: 8.7 mg/dL — ABNORMAL LOW (ref 8.9–10.3)
Chloride: 97 mmol/L — ABNORMAL LOW (ref 98–111)
Creatinine, Ser: 7.1 mg/dL — ABNORMAL HIGH (ref 0.61–1.24)
GFR, Estimated: 8 mL/min — ABNORMAL LOW (ref >60)
Glucose, Bld: 115 mg/dL — ABNORMAL HIGH (ref 70–99)
Potassium: 4 mmol/L (ref 3.5–5.1)
Sodium: 135 mmol/L (ref 135–145)

## 2021-06-20 LAB — GLUCOSE, CAPILLARY
Glucose-Capillary: 145 mg/dL — ABNORMAL HIGH (ref 70–99)
Glucose-Capillary: 127 mg/dL — ABNORMAL HIGH (ref 70–99)
Glucose-Capillary: 139 mg/dL — ABNORMAL HIGH (ref 70–99)
Glucose-Capillary: 125 mg/dL — ABNORMAL HIGH (ref 70–99)

## 2021-06-20 LAB — PHOSPHORUS: Phosphorus: 5.3 mg/dL — ABNORMAL HIGH (ref 2.5–4.6)

## 2021-06-20 LAB — SEDIMENTATION RATE: Sed Rate: 105 mm/hr — ABNORMAL HIGH (ref 0–16)

## 2021-06-20 LAB — C-REACTIVE PROTEIN: CRP: 10.9 mg/dL — ABNORMAL HIGH (ref <1.0)

## 2021-06-20 MED ORDER — METOPROLOL TARTRATE 25 MG PO TABS
25.0000 mg | ORAL_TABLET | Freq: Two times a day (BID) | ORAL | Status: DC
  Administered 2021-06-20 – 2021-06-21 (×2): 25 mg via ORAL
  Filled 2021-06-20 (×3): qty 1

## 2021-06-20 MED ORDER — METOPROLOL TARTRATE 12.5 MG HALF TABLET
12.5000 mg | ORAL_TABLET | Freq: Two times a day (BID) | ORAL | Status: DC
  Administered 2021-06-20: 12.5 mg via ORAL
  Filled 2021-06-20 (×2): qty 1

## 2021-06-20 MED ORDER — DARBEPOETIN ALFA 100 MCG/0.5ML IJ SOSY
100.0000 ug | PREFILLED_SYRINGE | INTRAMUSCULAR | Status: DC
  Filled 2021-06-20: qty 0.5

## 2021-06-20 MED ORDER — METOPROLOL TARTRATE 12.5 MG HALF TABLET
12.5000 mg | ORAL_TABLET | Freq: Once | ORAL | Status: AC
  Administered 2021-06-20: 12.5 mg via ORAL
  Filled 2021-06-20: qty 1

## 2021-06-20 NOTE — Progress Notes (Signed)
[facility] [place] PROGRESS NOTE  ASSESSMENT / PLAN: [name] is a 70 y.o. male status post left popliteal - PT bypass with ipsilateral greater saphenous vein 06/18/21.    PRN pain control PT / OT / OOB / Ambulate ASA / Statin for PAD HD per nephrology May be ready to DC Abx - will discuss with Dr. Oneida Alar tomorrow  SUBJECTIVE: Doing well. Pain well controlled. Limited time OOB.  OBJECTIVE: BP 129/69 (BP Location: Right Arm)   Pulse 60   Temp 98.5 F (36.9 C) (Oral)   Resp 16   Ht 5\' 11"  (1.803 m)   Wt 97.2 kg   SpO2 100%   BMI 29.89 kg/m   Intake/Output Summary (Last 24 hours) at 06/20/2021 1004 Last data filed at 06/19/2021 2120 Gross per 24 hour  Intake 120 ml  Output 200 ml  Net -80 ml    Constitutional: well appearing. no acute distress. Cardiac: RRR. Pulmonary: unlabored Abdomen: soft Vascular: LLE: incisions clean and dry. Strong DS in PT  RLE: strong DS in bypass graft about knee  CBC Latest Ref Rng & Units 06/19/2021 06/18/2021 06/18/2021  WBC 4.0 - 10.5 K/uL 18.7(H) - 11.9(H)  Hemoglobin 13.0 - 17.0 g/dL 8.7(L) 9.9(L) 9.4(L)  Hematocrit 39.0 - 52.0 % 27.3(L) 29.0(L) 29.0(L)  Platelets 150 - 400 K/uL 187 - 137(L)     CMP Latest Ref Rng & Units 06/19/2021 06/18/2021 06/18/2021  Glucose 70 - 99 mg/dL 239(H) 148(H) 99  BUN 8 - 23 mg/dL 33(H) 27(H) 21  Creatinine 0.61 - 1.24 mg/dL 8.17(H) 7.70(H) 6.80(H)  Sodium 135 - 145 mmol/L 131(L) 134(L) 131(L)  Potassium 3.5 - 5.1 mmol/L 5.3(H) 4.2 4.3  Chloride 98 - 111 mmol/L 99 102 96(L)  CO2 22 - 32 mmol/L 23 - 27  Calcium 8.9 - 10.3 mg/dL 9.1 - 9.0  Total Protein 6.5 - 8.1 g/dL - - -  Total Bilirubin 0.3 - 1.2 mg/dL - - -  Alkaline Phos 38 - 126 U/L - - -  AST 15 - 41 U/L - - -  ALT 0 - 44 U/L - - -    Estimated Creatinine Clearance: 9.7 mL/min (A) (by C-G formula based on SCr of 8.17 mg/dL (H)).  [name]. Stanford Breed, MD [facility] Phone Number:  [phone] 06/20/2021 10:04 AM

## 2021-06-20 NOTE — Progress Notes (Signed)
Mobility Specialist: Progress Note   06/20/21 1330  Mobility  Activity Stood at bedside  Level of Assistance Maximum assist, patient does 25-49%  Assistive Device Front wheel walker  Mobility Out of bed to chair with meals  Mobility Response Tolerated poorly  Mobility performed by Mobility specialist  $Mobility charge 1 Mobility   Pre-Mobility: 101 HR, 96% SpO2 During Mobility: 159 HR Post-Mobility: 107 HR, 100% SpO2  Pt c/o feeling a little dizzy upon sitting EOB from supine. Pt required modA to sit EOB and maxA to stand. Once standing pt's HR jumped to 159 bpm and pt said he felt like he was going to fall so I had pt sit back down. Pt back to bed with call bell at his side. I recommend +2 for safety next session.   [facility] Makoa Satz Mobility Specialist Mobility Specialist Phone: [phone]
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
## 2021-06-20 NOTE — Progress Notes (Signed)
Pharmacy Antibiotic Note  [name] is a 70 y.o. male admitted on 06/14/2021 with cellulitis with gangrene of the 3rd/4th toes. Pharmacy has been consulted for vancomycin dosing. Patient receiving hemodialysis TTS. Shortened dialysis session 7/9 due to hypotension may affect next vancomycin level. WBC trending upward on 7/9. Blood cultures reported no growth. MRSA PCR negative. Currently on day 7 of IV antibiotics.  Plan: Will plan on next vancomycin concentration with Tuesday dialysis if antibiotics are not stopped prior. Continue Vancomycin 1g with TTS dialysis Continue Cefepime 2g with TTS dialysis Continue Metronidazole 500 mg every 8 hours Continue to monitor CBC  Height: 5\' 11"  (180.3 cm) Weight: 97.2 kg (214 lb 4.6 oz) IBW/kg (Calculated) : 75.3  Temp (24hrs), Avg:97.8 F (36.6 C), Min:97.5 F (36.4 C), Max:98.5 F (36.9 C)  Recent Labs  Lab 06/14/21 1635 06/15/21 0158 06/16/21 0232 06/17/21 0118 06/18/21 0217 06/18/21 0613 06/18/21 1722 06/19/21 0002 06/20/21 0904  WBC 15.3* 8.7 9.0 13.3*  --  11.9*  --  18.7*  --   CREATININE 14.15* 14.34* 8.97* 10.62* 6.80*  --  7.70* 8.17* 7.10*  LATICACIDVEN 1.2  --   --   --   --   --   --   --   --     Estimated Creatinine Clearance: 11.2 mL/min (A) (by C-G formula based on SCr of 7.1 mg/dL (H)).    Allergies  Allergen Reactions   Other Swelling and Itching   Shrimp [Shellfish Allergy] Swelling    Antimicrobials this admission: Cefepime 2 g 7/4 >>  Vancomycin 1 g 7/4 >>  Metronidazole 500 mg 7/5>>  Dose adjustments this admission: none  Microbiology results: 7/7 BCx: NG x 3 days 7/8 MRSA PCR: negative 7/4 Resp panel: negative  Thank you for allowing pharmacy to be a part of this patient's care.  Ursula Beath 06/20/2021 11:04 AM

## 2021-06-20 NOTE — Progress Notes (Signed)
PT Cancellation Note  Patient Details Name: [name] MRN: [id number] DOB: January 18, 1951   Cancelled Treatment:    Reason Eval/Treat Not Completed: Patient declined, no reason specified (pt stating he will not get up at this time. Pt attempted mobility this afternoon with mobility tech with increased HR and difficulty and refused further attempts. Pt encouraged to participate in future sessions for D/C planning)   Kapri Nero B Jonah Nestle 06/20/2021, 2:26 PM Bayard Males, PT Acute Rehabilitation Services Pager: [phone] Office: [phone]

## 2021-06-21 ENCOUNTER — Inpatient Hospital Stay (HOSPITAL_COMMUNITY): Payer: Medicare Other

## 2021-06-21 DIAGNOSIS — L0390 Cellulitis, unspecified: Secondary | ICD-10-CM

## 2021-06-21 DIAGNOSIS — I5032 Chronic diastolic (congestive) heart failure: Secondary | ICD-10-CM | POA: Diagnosis not present

## 2021-06-21 DIAGNOSIS — Z992 Dependence on renal dialysis: Secondary | ICD-10-CM

## 2021-06-21 DIAGNOSIS — N186 End stage renal disease: Secondary | ICD-10-CM | POA: Diagnosis not present

## 2021-06-21 DIAGNOSIS — I739 Peripheral vascular disease, unspecified: Secondary | ICD-10-CM

## 2021-06-21 DIAGNOSIS — A419 Sepsis, unspecified organism: Principal | ICD-10-CM

## 2021-06-21 DIAGNOSIS — D638 Anemia in other chronic diseases classified elsewhere: Secondary | ICD-10-CM

## 2021-06-21 LAB — GLUCOSE, CAPILLARY
Glucose-Capillary: 88 mg/dL (ref 70–99)
Glucose-Capillary: 115 mg/dL — ABNORMAL HIGH (ref 70–99)
Glucose-Capillary: 130 mg/dL — ABNORMAL HIGH (ref 70–99)
Glucose-Capillary: 129 mg/dL — ABNORMAL HIGH (ref 70–99)

## 2021-06-21 LAB — HEMOGLOBIN AND HEMATOCRIT, BLOOD
HCT: 22.4 % — ABNORMAL LOW (ref 39.0–52.0)
Hemoglobin: 7.1 g/dL — ABNORMAL LOW (ref 13.0–17.0)

## 2021-06-21 LAB — CBC
HCT: 19.2 % — ABNORMAL LOW (ref 39.0–52.0)
Hemoglobin: 6.2 g/dL — CL (ref 13.0–17.0)
MCH: 29.8 pg (ref 26.0–34.0)
MCHC: 32.3 g/dL (ref 30.0–36.0)
MCV: 92.3 fL (ref 80.0–100.0)
Platelets: 223 10*3/uL (ref 150–400)
RBC: 2.08 MIL/uL — ABNORMAL LOW (ref 4.22–5.81)
RDW: 17.5 % — ABNORMAL HIGH (ref 11.5–15.5)
WBC: 16.8 10*3/uL — ABNORMAL HIGH (ref 4.0–10.5)
nRBC: 0.4 % — ABNORMAL HIGH (ref 0.0–0.2)

## 2021-06-21 LAB — PREPARE RBC (CROSSMATCH)

## 2021-06-21 MED ORDER — SODIUM CHLORIDE 0.9% IV SOLUTION: Freq: Once | INTRAVENOUS | Status: AC

## 2021-06-21 MED ORDER — CHLORHEXIDINE GLUCONATE CLOTH 2 % EX PADS
6.0000 | MEDICATED_PAD | Freq: Every day | CUTANEOUS | Status: DC
  Administered 2021-06-22 – 2021-06-26 (×3): 6 via TOPICAL

## 2021-06-21 MED ORDER — INSULIN GLARGINE 100 UNIT/ML ~~LOC~~ SOLN
4.0000 [IU] | Freq: Two times a day (BID) | SUBCUTANEOUS | Status: DC
  Administered 2021-06-21 – 2021-06-27 (×12): 4 [IU] via SUBCUTANEOUS
  Filled 2021-06-21 (×13): qty 0.04

## 2021-06-21 NOTE — Care Management Important Message (Signed)
Important Message  Patient Details  Name: [name] MRN: [id number] Date of Birth: [birth year]   Medicare Important Message Given:  Yes     [name] 06/21/2021, 9:59 AM

## 2021-06-21 NOTE — Progress Notes (Signed)
[facility] Progress Note   Subjective:   Patient seen and examined at bedside.  Reports weakness and nausea with standing this AM.  Hgb dropped overnight to 6.2.  Denies CP, SOB, dizziness, vomiting, diarrhea and fatigue.  Admits to pain in L foot.    Objective Vitals:   06/21/21 0826 06/21/21 1231 06/21/21 1300 06/21/21 1515  BP: (!) 131/49 138/62 (!) 132/46 (!) 148/58  Pulse: (!) 55 (!) 56 (!) 52 (!) 56  Resp: 16 17 18 19   Temp: 97.8 F (36.6 C) 97.9 F (36.6 C) 97.8 F (36.6 C) 97.6 F (36.4 C)  TempSrc: Oral Oral  Oral  SpO2: 100% 100% 100% 100%  Weight:      Height:       Physical Exam General:WDWN male in NAD Heart:RRR, no mrg Lungs:CTAB anterolaterally, nml WOB on RA  Abdomen:soft, NTND Extremities:L foot dressed, +scaly/woody edema b/l, L>R Dialysis Access: LU AVF +b/t   Filed Weights   06/19/21 1211 06/20/21 0353 06/21/21 0558  Weight: 95.4 kg 97.2 kg 94.7 kg    Intake/Output Summary (Last 24 hours) at 06/21/2021 1518 Last data filed at 06/21/2021 1510 Gross per 24 hour  Intake 715 ml  Output --  Net 715 ml    Additional Objective Labs: Basic Metabolic Panel: Recent Labs  Lab 06/15/21 0158 06/16/21 0232 06/18/21 0217 06/18/21 1722 06/19/21 0002 06/20/21 0904  NA 134*   < > 131* 134* 131* 135  K 3.6   < > 4.3 4.2 5.3* 4.0  CL 94*   < > 96* 102 99 97*  CO2 28   < > 27  --  23 28  GLUCOSE 118*   < > 99 148* 239* 115*  BUN 48*   < > 21 27* 33* 35*  CREATININE 14.34*   < > 6.80* 7.70* 8.17* 7.10*  CALCIUM 9.3   < > 9.0  --  9.1 8.7*  PHOS 4.1  --   --   --   --  5.3*   < > = values in this interval not displayed.   Liver Function Tests: Recent Labs  Lab 06/14/21 1635  AST 21  ALT 13  ALKPHOS 40  BILITOT 0.9  PROT 7.2  ALBUMIN 2.7*    CBC: Recent Labs  Lab 06/14/21 1635 06/15/21 0158 06/16/21 0232 06/17/21 0118 06/18/21 0613 06/18/21 1722 06/19/21 0002 06/21/21 0038  WBC 15.3*   < > 9.0 13.3* 11.9*  --  18.7*  16.8*  NEUTROABS 12.4*  --   --   --   --   --   --   --   HGB 9.6*   < > 9.2* 8.7* 9.4* 9.9* 8.7* 6.2*  HCT 29.6*   < > 28.4* 26.4* 29.0* 29.0* 27.3* 19.2*  MCV 92.5   < > 91.9 88.9 90.9  --  91.3 92.3  PLT 203   < > 97* 115* 137*  --  187 223   < > = values in this interval not displayed.   Blood Culture    Component Value Date/Time   SDES BLOOD RIGHT WRIST 06/17/2021 1632   SPECREQUEST  06/17/2021 1632    BOTTLES DRAWN AEROBIC AND ANAEROBIC Blood Culture results may not be optimal due to an inadequate volume of blood received in culture bottles   CULT  06/17/2021 1632    NO GROWTH 4 DAYS Performed at [facility], [place] [address] [place], [place] [address]    REPTSTATUS PENDING 06/17/2021 [phone]  CBG: Recent Labs  Lab 06/20/21 1202 06/20/21 1721 06/20/21 2159 06/21/21 0632 06/21/21 1245  GLUCAP 127* 139* 125* 88 115*    Medications:  sodium chloride     sodium chloride     ceFEPime (MAXIPIME) IV 2 g (06/19/21 1127)   magnesium sulfate bolus IVPB     vancomycin 1,000 mg (06/19/21 1021)    aspirin EC  81 mg Oral Daily   atorvastatin  80 mg Oral Daily   Chlorhexidine Gluconate Cloth  6 each Topical Daily   Chlorhexidine Gluconate Cloth  6 each Topical Q0600   [START ON 06/24/2021] darbepoetin (ARANESP) injection - DIALYSIS  100 mcg Intravenous Q Thu-HD   docusate sodium  100 mg Oral Daily   heparin  5,000 Units Subcutaneous Q8H   insulin aspart  0-6 Units Subcutaneous TID WC   insulin glargine  5 Units Subcutaneous BID   metoprolol tartrate  25 mg Oral BID   metroNIDAZOLE  500 mg Oral Q8H   pantoprazole  40 mg Oral Daily   sodium chloride flush  3 mL Intravenous Q12H    Dialysis Orders: TTS at [facility], missed HD 7/2, last session there on 6/30 4hr, 200dialyzer, 450/A1.5, EDW 95.5kg, 2K/2Ca, AVF, heparin 3000 bolus - Sensipar 60mg  PO q HD - Hectoral 4mcg IV q HD - Mircera 71mcg IV q 2 weeks (last 6/30)   Assessment/Plan: AMS:  Presumed sepsis from foot  infection and A fib. Appears resolved. A Fib - cardiology consulted. Started metoprolol 25mg  BID for rate control. In NSR. Recommend starting eliquis when anemia stabilizes and vascular clears post surgery.  PAD/B foot wounds/R shallow ulcer, L 3rd/4th toe gangrene:  Blood Cx (-). VVS consulted-s/p left popliteal-PT bypass with ipsilateral greater saphenous vein performed on 7/8. On vanc/cefepime/flagyl. Per PMD/VVS. ESRD:  on HD TTS.  Plan for HD tomorrow per regular schedule.  Hypertension/volume: BP mostly well controlled.  Hypotension with HD on Saturday.  Hold antihypertensive prior to HD. Close to EDW if weights correct.  Chronic LE edema noted on exam, UF as tolerated.  Anemia CKD:  Hgb drop 6.2 overnight, 1 unit pRBC ordered for today.  ESA due 7/14.   Metabolic bone disease: Ca/Phos ok, continue home meds.  T2DM - per primary team    Jen Mow, PA-C [facility] 06/21/2021,3:18 PM  LOS: 7 days

## 2021-06-21 NOTE — Progress Notes (Signed)
Dr. Hal Hope text page HBG 6.2 Tim R.N. aware
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
## 2021-06-21 NOTE — Progress Notes (Signed)
Mobility Specialist: Progress Note   06/21/21 1314  Mobility  Activity Refused mobility   Pt refused mobility stating he is not getting up until tomorrow. Will f/u as able.   [facility] Avigayil Ton Mobility Specialist Mobility Specialist Phone: [phone]
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
## 2021-06-21 NOTE — Progress Notes (Signed)
Carotid duplex bilateral study completed.   Please see CV Proc for preliminary results.   Kirke Breach, RDMS, RVT  

## 2021-06-21 NOTE — Progress Notes (Signed)
[facility]  Subjective  - pt feels ok   Objective (!) 132/58 (!) 55 (!) 97.4 F (36.3 C) (Oral) 16 100%  Intake/Output Summary (Last 24 hours) at 06/21/2021 0753 Last data filed at 06/20/2021 2125 Gross per 24 hour  Intake 120 ml  Output --  Net 120 ml   Left foot warm incision intact no erythema in foot Biphasic PT in bypass graft Monophasic DP Incisions in leg and thigh healing  Assessment/Planning: POD 3 from left pop PT bypass D/c home when more mobile Apparently being transfused this morning Weight bearing on right foot and left heel May need SNF if not more active with PT  [name] 06/21/2021 7:53 AM --  Laboratory Lab Results: Recent Labs    06/19/21 0002 06/21/21 0038  WBC 18.7* 16.8*  HGB 8.7* 6.2*  HCT 27.3* 19.2*  PLT 187 223   BMET Recent Labs    06/19/21 0002 06/20/21 0904  NA 131* 135  K 5.3* 4.0  CL 99 97*  CO2 23 28  GLUCOSE 239* 115*  BUN 33* 35*  CREATININE 8.17* 7.10*  CALCIUM 9.1 8.7*    COAG Lab Results  Component Value Date   INR 1.00 07/10/2018   INR 1.15 04/12/2017   INR 1.51 03/22/2017   No results found for: PTT
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
## 2021-06-21 NOTE — Progress Notes (Signed)
Dr. Hal Hope return call and will put orders in to give blood

## 2021-06-22 DIAGNOSIS — L0390 Cellulitis, unspecified: Secondary | ICD-10-CM | POA: Diagnosis not present

## 2021-06-22 DIAGNOSIS — A419 Sepsis, unspecified organism: Secondary | ICD-10-CM | POA: Diagnosis not present

## 2021-06-22 LAB — BASIC METABOLIC PANEL
Anion gap: 9 (ref 5–15)
BUN: 53 mg/dL — ABNORMAL HIGH (ref 8–23)
CO2: 27 mmol/L (ref 22–32)
Calcium: 8.9 mg/dL (ref 8.9–10.3)
Chloride: 97 mmol/L — ABNORMAL LOW (ref 98–111)
Creatinine, Ser: 10.48 mg/dL — ABNORMAL HIGH (ref 0.61–1.24)
GFR, Estimated: 5 mL/min — ABNORMAL LOW (ref >60)
Glucose, Bld: 107 mg/dL — ABNORMAL HIGH (ref 70–99)
Potassium: 3.8 mmol/L (ref 3.5–5.1)
Sodium: 133 mmol/L — ABNORMAL LOW (ref 135–145)

## 2021-06-22 LAB — BPAM RBC
Blood Product Expiration Date: 202208012359
ISSUE DATE / TIME: 202207111239
Unit Type and Rh: 6200

## 2021-06-22 LAB — TYPE AND SCREEN
ABO/RH(D): A POS
Antibody Screen: NEGATIVE
Unit division: 0

## 2021-06-22 LAB — CULTURE, BLOOD (ROUTINE X 2)
Culture: NO GROWTH
Culture: NO GROWTH

## 2021-06-22 LAB — CBC
HCT: 23.1 % — ABNORMAL LOW (ref 39.0–52.0)
Hemoglobin: 7.7 g/dL — ABNORMAL LOW (ref 13.0–17.0)
MCH: 30.2 pg (ref 26.0–34.0)
MCHC: 33.3 g/dL (ref 30.0–36.0)
MCV: 90.6 fL (ref 80.0–100.0)
Platelets: 247 10*3/uL (ref 150–400)
RBC: 2.55 MIL/uL — ABNORMAL LOW (ref 4.22–5.81)
RDW: 17.7 % — ABNORMAL HIGH (ref 11.5–15.5)
WBC: 13.9 10*3/uL — ABNORMAL HIGH (ref 4.0–10.5)
nRBC: 0.3 % — ABNORMAL HIGH (ref 0.0–0.2)

## 2021-06-22 LAB — GLUCOSE, CAPILLARY
Glucose-Capillary: 92 mg/dL (ref 70–99)
Glucose-Capillary: 84 mg/dL (ref 70–99)
Glucose-Capillary: 102 mg/dL — ABNORMAL HIGH (ref 70–99)
Glucose-Capillary: 127 mg/dL — ABNORMAL HIGH (ref 70–99)

## 2021-06-22 LAB — VANCOMYCIN, RANDOM: Vancomycin Rm: 25

## 2021-06-22 MED ORDER — SODIUM CHLORIDE 0.9 % IV SOLN: 100.0000 mL | INTRAVENOUS | Status: DC | PRN

## 2021-06-22 MED ORDER — LIDOCAINE HCL (PF) 1 % IJ SOLN: 5.0000 mL | INTRAMUSCULAR | Status: DC | PRN

## 2021-06-22 MED ORDER — METOPROLOL SUCCINATE ER 25 MG PO TB24
25.0000 mg | ORAL_TABLET | Freq: Every day | ORAL | Status: DC
  Administered 2021-06-22 – 2021-06-27 (×6): 25 mg via ORAL
  Filled 2021-06-22 (×7): qty 1

## 2021-06-22 MED ORDER — HEPARIN SODIUM (PORCINE) 1000 UNIT/ML DIALYSIS: 1000.0000 [IU] | INTRAMUSCULAR | Status: DC | PRN

## 2021-06-22 MED ORDER — ALTEPLASE 2 MG IJ SOLR: 2.0000 mg | Freq: Once | INTRAMUSCULAR | Status: DC | PRN

## 2021-06-22 MED ORDER — LIDOCAINE-PRILOCAINE 2.5-2.5 % EX CREA: 1.0000 "application " | TOPICAL_CREAM | CUTANEOUS | Status: DC | PRN

## 2021-06-22 MED ORDER — AMLODIPINE BESYLATE 10 MG PO TABS
10.0000 mg | ORAL_TABLET | Freq: Every day | ORAL | Status: DC
  Administered 2021-06-22 – 2021-06-27 (×6): 10 mg via ORAL
  Filled 2021-06-22 (×7): qty 1

## 2021-06-22 MED ORDER — PENTAFLUOROPROP-TETRAFLUOROETH EX AERO: 1.0000 "application " | INHALATION_SPRAY | CUTANEOUS | Status: DC | PRN
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
## 2021-06-22 NOTE — Progress Notes (Signed)
[facility] Progress Note   Subjective:   Patient seen and examined at bedside in dialysis.  Tolerating treatment well.  Nausea resolved.  Denies CP, SOB, v/d, abdominal pain, weakness and fatigue.   Objective Vitals:   06/22/21 1000 06/22/21 1030 06/22/21 1100 06/22/21 1105  BP: (!) 164/71 (!) 160/67 (!) 143/65 136/67  Pulse: 60 63 83 64  Resp:    18  Temp:    97.8 F (36.6 C)  TempSrc:    Oral  SpO2:    99%  Weight:    90.6 kg  Height:       Physical Exam General:well appearing male in NAD Heart:RRR, no mrg Lungs:CTAB, nml WOB on RA Abdomen:soft, NTND Extremities: chronic LE edema, L>R, scaly/flaky skin, L foot dressed Dialysis Access: RU AVF in use   Filed Weights   06/22/21 0549 06/22/21 0757 06/22/21 1105  Weight: 95 kg 93.6 kg 90.6 kg    Intake/Output Summary (Last 24 hours) at 06/22/2021 1135 Last data filed at 06/22/2021 1105 Gross per 24 hour  Intake 595 ml  Output 2500 ml  Net -1905 ml    Additional Objective Labs: Basic Metabolic Panel: Recent Labs  Lab 06/19/21 0002 06/20/21 0904 06/22/21 0236  NA 131* 135 133*  K 5.3* 4.0 3.8  CL 99 97* 97*  CO2 23 28 27   GLUCOSE 239* 115* 107*  BUN 33* 35* 53*  CREATININE 8.17* 7.10* 10.48*  CALCIUM 9.1 8.7* 8.9  PHOS  --  5.3*  --     CBC: Recent Labs  Lab 06/17/21 0118 06/18/21 0613 06/18/21 1722 06/19/21 0002 06/21/21 0038 06/21/21 1654 06/22/21 0236  WBC 13.3* 11.9*  --  18.7* 16.8*  --  13.9*  HGB 8.7* 9.4*   < > 8.7* 6.2* 7.1* 7.7*  HCT 26.4* 29.0*   < > 27.3* 19.2* 22.4* 23.1*  MCV 88.9 90.9  --  91.3 92.3  --  90.6  PLT 115* 137*  --  187 223  --  247   < > = values in this interval not displayed.   Blood Culture    Component Value Date/Time   SDES BLOOD RIGHT WRIST 06/17/2021 1632   SPECREQUEST  06/17/2021 1632    BOTTLES DRAWN AEROBIC AND ANAEROBIC Blood Culture results may not be optimal due to an inadequate volume of blood received in culture bottles   CULT   06/17/2021 1632    NO GROWTH 5 DAYS Performed at [facility], [place] [address], [place], [place] [address]    REPTSTATUS 06/22/2021 FINAL 06/17/2021 1632   Studies/Results: VAS US CAROTID  Result Date: 06/21/2021 Carotid Arterial Duplex Study Patient Name:  [name]  Date of Exam:   06/21/2021 Medical Rec #: [id number]        Accession #:    [id number] Date of Birth: 05/27/1951        Patient Gender: M Patient Age:   70Y Exam Location:  [facility] Procedure:      VAS US CAROTID Referring Phys: [id number] FANG XU --------------------------------------------------------------------------------  Indications:       Calcifications seen on recent CT spine imaging. Risk Factors:      Hypertension, Diabetes, PAD. Other Factors:     ESRD. Comparison Study:  No prior studies. Performing Technologist: Darlin Coco RDMS,RVT  Examination Guidelines: A complete evaluation includes B-mode imaging, spectral Doppler, color Doppler, and power Doppler as needed of all accessible portions of each vessel. Bilateral testing is considered an integral part  of a complete examination. Limited examinations for reoccurring indications may be performed as noted.  Right Carotid Findings: +----------+--------+--------+--------+------------------+---------------------+           PSV cm/sEDV cm/sStenosisPlaque DescriptionComments              +----------+--------+--------+--------+------------------+---------------------+ CCA Prox  101     11                                                      +----------+--------+--------+--------+------------------+---------------------+ CCA Distal89      15                                                      +----------+--------+--------+--------+------------------+---------------------+ ICA Prox  90      25      1-39%   calcific and      Velocities may                                          irregular         underestimate degree                                                       of stenosis due to                                                        more proximal                                                             obstruction.          +----------+--------+--------+--------+------------------+---------------------+ ICA Mid   91      25                                                      +----------+--------+--------+--------+------------------+---------------------+ ICA Distal81      22                                                      +----------+--------+--------+--------+------------------+---------------------+ ECA       138                                                             +----------+--------+--------+--------+------------------+---------------------+ +----------+--------+-------+------------------------------+-------------------+  PSV cm/sEDV cmsDescribe                      Arm Pressure (mmHG) +----------+--------+-------+------------------------------+-------------------+ Subclavian260            Elevated velocities without                                                evidence of stenosis                              +----------+--------+-------+------------------------------+-------------------+ +---------+--------+--------+--------------+ VertebralPSV cm/sEDV cm/sNot identified +---------+--------+--------+--------------+  Left Carotid Findings: +----------+--------+--------+--------+----------------------+--------+           PSV cm/sEDV cm/sStenosisPlaque Description    Comments +----------+--------+--------+--------+----------------------+--------+ CCA Prox  144     10      <50%    calcific and smooth            +----------+--------+--------+--------+----------------------+--------+ CCA Distal66      9                                              +----------+--------+--------+--------+----------------------+--------+ ICA Prox   34      14      1-39%   calcific and irregular         +----------+--------+--------+--------+----------------------+--------+ ICA Distal59      22                                             +----------+--------+--------+--------+----------------------+--------+ ECA       153                     calcific and irregular         +----------+--------+--------+--------+----------------------+--------+ +----------+--------+--------+-----------------------------+-------------------+           PSV cm/sEDV cm/sDescribe                     Arm Pressure (mmHG) +----------+--------+--------+-----------------------------+-------------------+ Subclavian230             Patent, w/ AVF, calcific                                                   plaque without elevated                                                    velocities                                       +----------+--------+--------+-----------------------------+-------------------+ +---------+--------+--+--------+--+---------+ VertebralPSV cm/s56EDV cm/s15Antegrade +---------+--------+--+--------+--+---------+   Summary: Right Carotid: Velocities in the right ICA are consistent with a 1-39% stenosis. Left Carotid: Velocities in the left ICA are consistent with a 1-39% stenosis.  Non-hemodynamically significant plaque <50% noted in the CCA. Vertebrals: Left vertebral artery demonstrates antegrade flow. Right vertebral             artery was not visualized. *See table(s) above for measurements and observations.  Electronically signed by Monica Martinez MD on 06/21/2021 at 6:54:24 PM.    Final     Medications:  sodium chloride     sodium chloride     sodium chloride     sodium chloride     magnesium sulfate bolus IVPB      amLODipine  10 mg Oral Daily   aspirin EC  81 mg Oral Daily   atorvastatin  80 mg Oral Daily   Chlorhexidine Gluconate Cloth  6 each Topical Daily   Chlorhexidine Gluconate  Cloth  6 each Topical Q0600   Chlorhexidine Gluconate Cloth  6 each Topical Q0600   [START ON 06/24/2021] darbepoetin (ARANESP) injection - DIALYSIS  100 mcg Intravenous Q Thu-HD   docusate sodium  100 mg Oral Daily   heparin  5,000 Units Subcutaneous Q8H   insulin aspart  0-6 Units Subcutaneous TID WC   insulin glargine  4 Units Subcutaneous BID   metoprolol succinate  25 mg Oral Daily   pantoprazole  40 mg Oral Daily   sodium chloride flush  3 mL Intravenous Q12H    Dialysis Orders: [facility]TS [facility], missed HD 7/2, last session there on 6/30 4hr, 200dialyzer, 450/A1.5, EDW 95.5kg, 2K/2Ca, AVF, heparin 3000 bolus - Sensipar 60mg  PO q HD - Hectoral 68mcg IV q HD - Mircera 32mcg IV q 2 weeks (last 6/30)   Assessment/Plan: AMS:  Presumed sepsis from foot infection and A fib. Appears resolved. A Fib - cardiology consulted. Started metoprolol 25mg  BID for rate control, changed to metoprolol XL 25mg  qd to avoid bradycardia. In NSR. They recommended starting eliquis when anemia stabilizes and vascular clears post surgery. PAD/B foot wounds/R shallow ulcer, L 3rd/4th toe gangrene:  Blood Cx (-). VVS consulted-s/p left popliteal-PT bypass with ipsilateral greater saphenous vein and toe amputation performed on 7/8. On vanc/cefepime/flagyl. Per PMD/VVS. ESRD:  on HD TTS.  HD today per regular schedule.  Hypertension/volume: Hypertensive this AM but improving with HD.  Hold antihypertensive prior to HD. Under EDW if weights correct, chronic LE edema noted on exam, UF as tolerated.  Lower dry weight on d/c. Anemia CKD:  Hgb drop 6.2 on 7/11, 1 unit pRBC given, now improved to 7.7.  ESA due 7/14.  Metabolic bone disease: Ca/Phos ok, continue home meds.  T2DM - per primary team Nutrition - Renal diet w/fluid restrictions.   Jen Mow, PA-C [facility] 06/22/2021,11:35 AM  LOS: 8 days

## 2021-06-22 NOTE — Progress Notes (Addendum)
Progress Note    06/22/2021 7:36 AM 4 Days Post-Op  Subjective:  upset this morning.  Says he sat in poop for 4 hours.  Says he called out to let someone know.    Afebrile  Vitals:   06/21/21 2357 06/22/21 0422  BP: (!) 144/59 (!) 143/63  Pulse: 60 60  Resp: 16 20  Temp: 98.1 F (36.7 C) 97.6 F (36.4 C)  SpO2: 100% 100%    Physical Exam: Cardiac:  regular Lungs:  non labored Incisions:  all incisions look fine Toe amp site with some bloody ooze   Extremities:  brisk left PT doppler signal   CBC    Component Value Date/Time   WBC 13.9 (H) 06/22/2021 0236   RBC 2.55 (L) 06/22/2021 0236   HGB 7.7 (L) 06/22/2021 0236   HGB 11.8 (L) 03/03/2017 1635   HGB 9.4 (L) 07/03/2014 0812   HCT 23.1 (L) 06/22/2021 0236   HCT 26.6 (L) 03/22/2017 1207   HCT 29.0 (L) 07/03/2014 0812   PLT 247 06/22/2021 0236   PLT 170 03/03/2017 1635   MCV 90.6 06/22/2021 0236   MCV 91 03/03/2017 1635   MCV 86.1 07/03/2014 0812   MCH 30.2 06/22/2021 0236   MCHC 33.3 06/22/2021 0236   RDW 17.7 (H) 06/22/2021 0236   RDW 16.9 (H) 03/03/2017 1635   RDW 16.3 (H) 07/03/2014 0812   LYMPHSABS 1.4 06/14/2021 1635   LYMPHSABS 1.5 07/03/2014 0812   MONOABS 1.3 (H) 06/14/2021 1635   MONOABS 0.7 07/03/2014 0812   EOSABS 0.2 06/14/2021 1635   EOSABS 0.6 (H) 07/03/2014 0812   BASOSABS 0.0 06/14/2021 1635   BASOSABS 0.1 07/03/2014 0812    BMET    Component Value Date/Time   NA 133 (L) 06/22/2021 0236   NA 138 03/03/2017 1635   NA 142 07/03/2014 0814   K 3.8 06/22/2021 0236   K 4.4 07/03/2014 0814   CL 97 (L) 06/22/2021 0236   CO2 27 06/22/2021 0236   CO2 20 (L) 07/03/2014 0814   GLUCOSE 107 (H) 06/22/2021 0236   GLUCOSE 136 07/03/2014 0814   BUN 53 (H) 06/22/2021 0236   BUN 36 (H) 03/03/2017 1635   BUN 93.6 (H) 07/03/2014 0814   CREATININE 10.48 (H) 06/22/2021 0236   CREATININE 7.3 (HH) 07/03/2014 0814   CALCIUM 8.9 06/22/2021 0236   CALCIUM 8.6 07/03/2014 0814   GFRNONAA 5 (L)  06/22/2021 0236   GFRNONAA 31 (L) 08/16/2012 1517   GFRAA 6 (L) 09/12/2018 0443   GFRAA 36 (L) 08/16/2012 1517    INR    Component Value Date/Time   INR 1.00 07/10/2018 1654     Intake/Output Summary (Last 24 hours) at 06/22/2021 0736 Last data filed at 06/21/2021 1510 Gross per 24 hour  Intake 595 ml  Output --  Net 595 ml     Assessment/Plan:  70 y.o. male is s/p:  Left fem - PT bypass with GSV  4 Days Post-Op   -pt with brisk doppler signal left PT -encourage mobilization -discussed with pt heel weight bearing with shoe.    Leontine Locket, PA-C [facility] [phone] 06/22/2021 7:36 AM  Left leg incisions healing biphasic PT doppler below graft Awaiting SNF Ok to start anticoagulation when hemoglobin stable Hgb currently 7.7 in light of his renal failure and cardiac status pt would probably benefit from more transfusion.  Will leave at discretion of primary service.  Ruta Hinds, MD [facility] [place] Office: [phone]
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
## 2021-06-22 NOTE — Progress Notes (Signed)
Pt came back to rm 6 from dialysis. Reinitiated tele. VSS. Call bell within reach.  Lavenia Atlas, RN
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
## 2021-06-23 ENCOUNTER — Encounter (HOSPITAL_COMMUNITY): Payer: Self-pay

## 2021-06-23 ENCOUNTER — Other Ambulatory Visit (HOSPITAL_COMMUNITY): Payer: Self-pay

## 2021-06-23 DIAGNOSIS — L0390 Cellulitis, unspecified: Secondary | ICD-10-CM | POA: Diagnosis not present

## 2021-06-23 DIAGNOSIS — A419 Sepsis, unspecified organism: Secondary | ICD-10-CM | POA: Diagnosis not present

## 2021-06-23 LAB — BASIC METABOLIC PANEL
Anion gap: 10 (ref 5–15)
BUN: 27 mg/dL — ABNORMAL HIGH (ref 8–23)
CO2: 26 mmol/L (ref 22–32)
Calcium: 8.8 mg/dL — ABNORMAL LOW (ref 8.9–10.3)
Chloride: 96 mmol/L — ABNORMAL LOW (ref 98–111)
Creatinine, Ser: 7.36 mg/dL — ABNORMAL HIGH (ref 0.61–1.24)
GFR, Estimated: 7 mL/min — ABNORMAL LOW (ref >60)
Glucose, Bld: 106 mg/dL — ABNORMAL HIGH (ref 70–99)
Potassium: 3.7 mmol/L (ref 3.5–5.1)
Sodium: 132 mmol/L — ABNORMAL LOW (ref 135–145)

## 2021-06-23 LAB — CBC
HCT: 25.3 % — ABNORMAL LOW (ref 39.0–52.0)
Hemoglobin: 8.4 g/dL — ABNORMAL LOW (ref 13.0–17.0)
MCH: 30.4 pg (ref 26.0–34.0)
MCHC: 33.2 g/dL (ref 30.0–36.0)
MCV: 91.7 fL (ref 80.0–100.0)
Platelets: 251 10*3/uL (ref 150–400)
RBC: 2.76 MIL/uL — ABNORMAL LOW (ref 4.22–5.81)
RDW: 17.9 % — ABNORMAL HIGH (ref 11.5–15.5)
WBC: 15.5 10*3/uL — ABNORMAL HIGH (ref 4.0–10.5)
nRBC: 0 % (ref 0.0–0.2)

## 2021-06-23 LAB — GLUCOSE, CAPILLARY
Glucose-Capillary: 106 mg/dL — ABNORMAL HIGH (ref 70–99)
Glucose-Capillary: 113 mg/dL — ABNORMAL HIGH (ref 70–99)
Glucose-Capillary: 129 mg/dL — ABNORMAL HIGH (ref 70–99)
Glucose-Capillary: 101 mg/dL — ABNORMAL HIGH (ref 70–99)

## 2021-06-23 SURGERY — Surgical Case
Anesthesia: *Unknown

## 2021-06-23 MED ORDER — APIXABAN 5 MG PO TABS
5.0000 mg | ORAL_TABLET | Freq: Two times a day (BID) | ORAL | Status: DC
  Administered 2021-06-23 – 2021-06-27 (×9): 5 mg via ORAL
  Filled 2021-06-23 (×10): qty 1

## 2021-06-23 MED ORDER — CHLORHEXIDINE GLUCONATE CLOTH 2 % EX PADS
6.0000 | MEDICATED_PAD | Freq: Every day | CUTANEOUS | Status: DC
  Administered 2021-06-26: 6 via TOPICAL

## 2021-06-23 NOTE — Progress Notes (Signed)
Mobility Specialist: Progress Note   06/23/21 1228  Mobility  Activity Transferred:  Bed to chair  Level of Assistance Moderate assist, patient does 50-74%  Assistive Device Front wheel walker  Distance Ambulated (ft) 2 ft  Mobility Out of bed to chair with meals  Mobility Response Tolerated well  Mobility performed by Mobility specialist;Nurse  Bed Position Chair  $Mobility charge 1 Mobility   Pt assisted in transferring to chair per RN request. Pt refused surgical shoe so RN and myself stood on either side of the pt to assist in standing and pivoting to the chair. Instructed pt he has to wear the surgical shoe when he is up on his L foot, pt showed understanding. Pt has call bell and phone at his side and is planning to order his lunch.   [facility] [place] Mobility Specialist Mobility Specialist Phone: [phone]

## 2021-06-23 NOTE — Progress Notes (Addendum)
  Progress Note    06/23/2021 7:46 AM 5 Days Post-Op  Subjective:  says he feels better this morning.  Afebrile   Vitals:   06/22/21 2300 06/23/21 0418  BP: (!) 130/57 (!) 150/59  Pulse: 60 60  Resp: 18 16  Temp: 98 F (36.7 C) 98 F (36.7 C)  SpO2: 100% 100%    Physical Exam: Cardiac:  regular Lungs:  non labored Incisions:  all incisions healing nicely Extremities:  brisk left PT doppler signal; new dressing on foot and not changed.  Clean and dry.   CBC    Component Value Date/Time   WBC 15.5 (H) 06/23/2021 0245   RBC 2.76 (L) 06/23/2021 0245   HGB 8.4 (L) 06/23/2021 0245   HGB 11.8 (L) 03/03/2017 1635   HGB 9.4 (L) 07/03/2014 0812   HCT 25.3 (L) 06/23/2021 0245   HCT 26.6 (L) 03/22/2017 1207   HCT 29.0 (L) 07/03/2014 0812   PLT 251 06/23/2021 0245   PLT 170 03/03/2017 1635   MCV 91.7 06/23/2021 0245   MCV 91 03/03/2017 1635   MCV 86.1 07/03/2014 0812   MCH 30.4 06/23/2021 0245   MCHC 33.2 06/23/2021 0245   RDW 17.9 (H) 06/23/2021 0245   RDW 16.9 (H) 03/03/2017 1635   RDW 16.3 (H) 07/03/2014 0812   LYMPHSABS 1.4 06/14/2021 1635   LYMPHSABS 1.5 07/03/2014 0812   MONOABS 1.3 (H) 06/14/2021 1635   MONOABS 0.7 07/03/2014 0812   EOSABS 0.2 06/14/2021 1635   EOSABS 0.6 (H) 07/03/2014 0812   BASOSABS 0.0 06/14/2021 1635   BASOSABS 0.1 07/03/2014 0812    BMET    Component Value Date/Time   NA 132 (L) 06/23/2021 0245   NA 138 03/03/2017 1635   NA 142 07/03/2014 0814   K 3.7 06/23/2021 0245   K 4.4 07/03/2014 0814   CL 96 (L) 06/23/2021 0245   CO2 26 06/23/2021 0245   CO2 20 (L) 07/03/2014 0814   GLUCOSE 106 (H) 06/23/2021 0245   GLUCOSE 136 07/03/2014 0814   BUN 27 (H) 06/23/2021 0245   BUN 36 (H) 03/03/2017 1635   BUN 93.6 (H) 07/03/2014 0814   CREATININE 7.36 (H) 06/23/2021 0245   CREATININE 7.3 (HH) 07/03/2014 0814   CALCIUM 8.8 (L) 06/23/2021 0245   CALCIUM 8.6 07/03/2014 0814   GFRNONAA 7 (L) 06/23/2021 0245   GFRNONAA 31 (L) 08/16/2012  1517   GFRAA 6 (L) 09/12/2018 0443   GFRAA 36 (L) 08/16/2012 1517    INR    Component Value Date/Time   INR 1.00 07/10/2018 1654     Intake/Output Summary (Last 24 hours) at 06/23/2021 0746 Last data filed at 06/22/2021 1500 Gross per 24 hour  Intake --  Output 2501 ml  Net -2501 ml     Assessment/Plan:  70 y.o. male is s/p:  Left fem - PT bypass with GSV   5 Days Post-Op   -pt doing well with brisk doppler signal left PT -heel weight bearing with darco shoe -anemia improved to 8.4 today. -DVT prophylaxis per primary team and cardiology (currently sq heparin).  Ok from vascular standpoint to start Eliquis when anemia is stable.  -continue asa/statin   Leontine Locket, PA-C [facility] [phone] 06/23/2021 7:46 AM  Agree with above.  Incisions healing.  Patent bypass. Ok to start Eliquis.  Ok for d/c from my standpoint.  Ruta Hinds, MD [facility] [place] Office: [phone]
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
## 2021-06-23 NOTE — TOC Progression Note (Signed)
Transition of Care [facility]) - Progression Note    Patient Details  Name: [name] MRN: [id number] Date of Birth: 1951/10/04  Transition of Care [facility]) CM/SW [place],  Phone Number: 06/23/2021, 4:04 PM  Clinical Narrative:     Patient has no bed offers at this time.   Thurmond Butts, MSW, LCSW Clinical Social Worker    Expected Discharge Plan: Skilled Nursing Facility Barriers to Discharge: Ship broker, Continued Medical Work up, SNF Pending bed offer  Expected Discharge Plan and Services Expected Discharge Plan: [place] In-house Referral: Clinical Social Work     Living arrangements for the past 2 months: No permanent address, Homeless                                       Social Determinants of Health (SDOH) Interventions    Readmission Risk Interventions No flowsheet data found.

## 2021-06-23 NOTE — Progress Notes (Signed)
[facility] Progress Note   Subjective:   Patient seen and examined at bedside.  Grouchy.  No specific complaints.  Denies CP, SOB, n/v/d, and abdominal pain.   Objective Vitals:   06/23/21 0418 06/23/21 0500 06/23/21 0747 06/23/21 1115  BP: (!) 150/59  (!) 167/70 (!) 146/57  Pulse: 60  68 82  Resp: 16  18 11   Temp: 98 F (36.7 C)  98.6 F (37 C) 97.9 F (36.6 C)  TempSrc: Oral  Oral Oral  SpO2: 100%  100% 100%  Weight:  91.1 kg    Height:       Physical Exam General:well appearing male in NAD Heart:RRR, no mrg Lungs:CTAB, nml WOB Abdomen:soft, NTND Extremities:chronic edema, L foot dressed, scaly skin Dialysis Access: LU AVF +b/t   Filed Weights   06/22/21 0757 06/22/21 1105 06/23/21 0500  Weight: 93.6 kg 90.6 kg 91.1 kg    Intake/Output Summary (Last 24 hours) at 06/23/2021 1420 Last data filed at 06/23/2021 0837 Gross per 24 hour  Intake 240 ml  Output 1 ml  Net 239 ml    Additional Objective Labs: Basic Metabolic Panel: Recent Labs  Lab 06/20/21 0904 06/22/21 0236 06/23/21 0245  NA 135 133* 132*  K 4.0 3.8 3.7  CL 97* 97* 96*  CO2 28 27 26   GLUCOSE 115* 107* 106*  BUN 35* 53* 27*  CREATININE 7.10* 10.48* 7.36*  CALCIUM 8.7* 8.9 8.8*  PHOS 5.3*  --   --    CBC: Recent Labs  Lab 06/18/21 0613 06/18/21 1722 06/19/21 0002 06/21/21 0038 06/21/21 1654 06/22/21 0236 06/23/21 0245  WBC 11.9*  --  18.7* 16.8*  --  13.9* 15.5*  HGB 9.4*   < > 8.7* 6.2* 7.1* 7.7* 8.4*  HCT 29.0*   < > 27.3* 19.2* 22.4* 23.1* 25.3*  MCV 90.9  --  91.3 92.3  --  90.6 91.7  PLT 137*  --  187 223  --  247 251   < > = values in this interval not displayed.   Blood Culture    Component Value Date/Time   SDES BLOOD RIGHT WRIST 06/17/2021 1632   SPECREQUEST  06/17/2021 1632    BOTTLES DRAWN AEROBIC AND ANAEROBIC Blood Culture results may not be optimal due to an inadequate volume of blood received in culture bottles   CULT  06/17/2021 1632    NO  GROWTH 5 DAYS Performed at [facility], [place] [address] [place], [place] [address]    REPTSTATUS 06/22/2021 FINAL 06/17/2021 1632    CBG: Recent Labs  Lab 06/22/21 1146 06/22/21 1602 06/22/21 2220 06/23/21 0616 06/23/21 1115  GLUCAP 84 102* 127* 106* 113*    Studies/Results: VAS US CAROTID  Result Date: 06/21/2021 Carotid Arterial Duplex Study Patient Name:  [name]  Date of Exam:   06/21/2021 Medical Rec #: [id number]        Accession #:    [id number] Date of Birth: 09-29-1951        Patient Gender: M Patient Age:   70Y Exam Location:  [facility] Procedure:      VAS US CAROTID Referring Phys: [id number] FANG XU --------------------------------------------------------------------------------  Indications:       Calcifications seen on recent CT spine imaging. Risk Factors:      Hypertension, Diabetes, PAD. Other Factors:     ESRD. Comparison Study:  No prior studies. Performing Technologist: Darlin Coco RDMS,RVT  Examination Guidelines: A complete evaluation includes B-mode imaging, spectral Doppler, color  Doppler, and power Doppler as needed of all accessible portions of each vessel. Bilateral testing is considered an integral part of a complete examination. Limited examinations for reoccurring indications may be performed as noted.  Right Carotid Findings: +----------+--------+--------+--------+------------------+---------------------+           PSV cm/sEDV cm/sStenosisPlaque DescriptionComments              +----------+--------+--------+--------+------------------+---------------------+ CCA Prox  101     11                                                      +----------+--------+--------+--------+------------------+---------------------+ CCA Distal89      15                                                      +----------+--------+--------+--------+------------------+---------------------+ ICA Prox  90      25      1-39%   calcific and       Velocities may                                          irregular         underestimate degree                                                      of stenosis due to                                                        more proximal                                                             obstruction.          +----------+--------+--------+--------+------------------+---------------------+ ICA Mid   91      25                                                      +----------+--------+--------+--------+------------------+---------------------+ ICA Distal81      22                                                      +----------+--------+--------+--------+------------------+---------------------+ ECA       138                                                             +----------+--------+--------+--------+------------------+---------------------+ +----------+--------+-------+------------------------------+-------------------+  PSV cm/sEDV cmsDescribe                      Arm Pressure (mmHG) +----------+--------+-------+------------------------------+-------------------+ Subclavian260            Elevated velocities without                                                evidence of stenosis                              +----------+--------+-------+------------------------------+-------------------+ +---------+--------+--------+--------------+ VertebralPSV cm/sEDV cm/sNot identified +---------+--------+--------+--------------+  Left Carotid Findings: +----------+--------+--------+--------+----------------------+--------+           PSV cm/sEDV cm/sStenosisPlaque Description    Comments +----------+--------+--------+--------+----------------------+--------+ CCA Prox  144     10      <50%    calcific and smooth            +----------+--------+--------+--------+----------------------+--------+ CCA Distal66      9                                               +----------+--------+--------+--------+----------------------+--------+ ICA Prox  34      14      1-39%   calcific and irregular         +----------+--------+--------+--------+----------------------+--------+ ICA Distal59      22                                             +----------+--------+--------+--------+----------------------+--------+ ECA       153                     calcific and irregular         +----------+--------+--------+--------+----------------------+--------+ +----------+--------+--------+-----------------------------+-------------------+           PSV cm/sEDV cm/sDescribe                     Arm Pressure (mmHG) +----------+--------+--------+-----------------------------+-------------------+ Subclavian230             Patent, w/ AVF, calcific                                                   plaque without elevated                                                    velocities                                       +----------+--------+--------+-----------------------------+-------------------+ +---------+--------+--+--------+--+---------+ VertebralPSV cm/s56EDV cm/s15Antegrade +---------+--------+--+--------+--+---------+   Summary: Right Carotid: Velocities in the right ICA are consistent with a 1-39% stenosis. Left Carotid: Velocities in the left ICA are consistent with a 1-39% stenosis.  Non-hemodynamically significant plaque <50% noted in the CCA. Vertebrals: Left vertebral artery demonstrates antegrade flow. Right vertebral             artery was not visualized. *See table(s) above for measurements and observations.  Electronically signed by Monica Martinez MD on 06/21/2021 at 6:54:24 PM.    Final     Medications:  sodium chloride     sodium chloride     magnesium sulfate bolus IVPB      amLODipine  10 mg Oral Daily   apixaban  5 mg Oral BID   aspirin EC  81 mg Oral Daily    atorvastatin  80 mg Oral Daily   Chlorhexidine Gluconate Cloth  6 each Topical Daily   Chlorhexidine Gluconate Cloth  6 each Topical Q0600   Chlorhexidine Gluconate Cloth  6 each Topical Q0600   [START ON 06/24/2021] darbepoetin (ARANESP) injection - DIALYSIS  100 mcg Intravenous Q Thu-HD   docusate sodium  100 mg Oral Daily   insulin aspart  0-6 Units Subcutaneous TID WC   insulin glargine  4 Units Subcutaneous BID   metoprolol succinate  25 mg Oral Daily   pantoprazole  40 mg Oral Daily   sodium chloride flush  3 mL Intravenous Q12H    Dialysis Orders: TTS at [facility], missed HD 7/2, last session there on 6/30 4hr, 200dialyzer, 450/A1.5, EDW 95.5kg, 2K/2Ca, AVF, heparin 3000 bolus - Sensipar 60mg  PO q HD - Hectoral 29mcg IV q HD - Mircera 109mcg IV q 2 weeks (last 6/30)   Assessment/Plan: AMS:  Presumed sepsis from foot infection and A fib. Appears resolved. A Fib - cardiology consulted. Started metoprolol 25mg  BID for rate control, changed to metoprolol XL 25mg  qd to avoid bradycardia. In NSR. Plan to start Eliquis when anemia stabilized.  PAD/B foot wounds/R shallow ulcer, L 3rd/4th toe gangrene:  Blood Cx (-). VVS consulted-s/p left popliteal-PT bypass with ipsilateral greater saphenous vein and toe amputation performed on 7/8. On vanc/cefepime/flagyl. Per PMD/VVS. ESRD:  on HD TTS.  HD tomorrow per regular schedule. Holding heparin x1 week with recent surgery.  Hypertension/volume: BP remains elevated. Under EDW if weights correct, chronic LE edema noted on exam, continue to push down EDW if tolerated. Lower dry weight on d/c. Anemia CKD:  Hgb drop 6.2 on 7/11, 1 unit pRBC given, now improved to 8.4. ESA due 7/14.  Metabolic bone disease: Ca/Phos ok, continue home meds.  T2DM - per primary team Nutrition - Renal diet w/fluid restrictions.  Jen Mow, PA-C [facility] 06/23/2021,2:20 PM  LOS: 9 days

## 2021-06-23 NOTE — NC FL2 (Signed)
[place] LEVEL OF CARE SCREENING TOOL     IDENTIFICATION  Patient Name: [name] Birthdate: 1951-08-26 Sex: male Admission Date (Current Location): 06/14/2021  [facility] and [place] Number:  Herbalist and Address:  [facility], [place] [address] [place],  [address]      Provider Number: [id number]  Attending Physician Name and Address:  Ezekiel Slocumb, DO  Relative Name and Phone Number:       Current Level of Care: Hospital Recommended Level of Care: [place] Prior Approval Number:    Date Approved/Denied:   PASRR Number: [id number]  Discharge Plan: SNF    Current Diagnoses: Patient Active Problem List   Diagnosis Date Noted   Sepsis due to cellulitis ([place]) 06/14/2021   Cellulitis of foot, left    Venous stasis    History of endocarditis    Bradycardia    Orthostasis    Chest pain 09/10/2018   Hypovolemia associated with hemodialysis 07/11/2018   Weakness    Thrombocytopenia ([place]) 07/10/2018   Balanitis 04/25/2017   Penile swelling    Scrotal swelling    UTI (urinary tract infection) 04/13/2017   Anemia of chronic disease    Acute encephalopathy 04/12/2017   Urinary retention 04/10/2017   Bacteremia    RUQ abdominal pain    Gross hematuria    Other pancytopenia ([place])    Sepsis ([place]) 03/21/2017   Diarrhea 03/03/2017   Retained lens material following cataract surgery of both eyes 11/08/2016   Venous stasis ulcers of both lower extremities ([place]) 08/07/2015   Varicose veins of lower extremities with complications 74/07/1447   Malfunction of arteriovenous dialysis fistula (HCC)    CHF (congestive heart failure) ([place]) 06/23/2015   Lymphedema distichiasis syndrome with kidney disease and diabetes mellitus ([place]) 04/06/2015   Lymphedema of lower extremity 04/04/2015   Type II diabetes mellitus with nephropathy ([place]) 04/04/2015   MGUS (monoclonal gammopathy of unknown significance)  06/12/2014   Chronic diastolic heart failure ([place]) 10/30/2013   Special screening for malignant neoplasms, colon 07/09/2013   Proliferative diabetic retinopathy ([place]) 10/23/2012   Knee pain 08/21/2012   Mass of thigh 08/21/2012   FEVER UNSPECIFIED 05/06/2010   ORGANIC IMPOTENCE 12/02/2009   ONYCHOMYCOSIS 07/17/2009   Lymphedema 05/06/2009   ESRD on dialysis ([place]) 06/09/2008   Hyperlipidemia 03/19/2007   Class 2 severe obesity due to excess calories with serious comorbidity and body mass index (BMI) of 35.0 to 35.9 in adult [facility]) 02/08/2007   Essential hypertension 02/08/2007   Atherosclerosis of native arteries of extremity with intermittent claudication ([place]) 02/08/2007    Orientation RESPIRATION BLADDER Height & Weight     Self, Time, Situation, Place  Normal Continent Weight: 200 lb 13.4 oz (91.1 kg) Height:  5\' 11"  (180.3 cm)  BEHAVIORAL SYMPTOMS/MOOD NEUROLOGICAL BOWEL NUTRITION STATUS      Incontinent Diet (please see discharge summary)  AMBULATORY STATUS COMMUNICATION OF NEEDS Skin     Verbally Surgical wounds (closed incision LFT Leg, LFT Toe, Closed incision RT)                       Personal Care Assistance Level of Assistance              Functional Limitations Info  Sight, Hearing, Speech Sight Info: Adequate Hearing Info: Adequate Speech Info: Adequate    SPECIAL CARE FACTORS FREQUENCY  PT (By licensed PT), OT (By licensed OT)  PT Frequency: 5x per week OT Frequency: 5x per week            Contractures Contractures Info: Not present    Additional Factors Info  Code Status, Allergies Code Status Info: FULL Allergies Info: Shrimp, Shellfish           Current Medications (06/23/2021):  This is the current hospital active medication list Current Facility-Administered Medications  Medication Dose Route Frequency Provider Last Rate Last Admin   0.9 %  sodium chloride infusion  250 mL Intravenous PRN [name], MD   New Bag at  06/18/21 1300   0.9 %  sodium chloride infusion  500 mL Intravenous Once PRN [name], MD       acetaminophen (TYLENOL) tablet 650 mg  650 mg Oral Q6H PRN Vianne Bulls, MD   650 mg at 06/22/21 1642   Or   acetaminophen (TYLENOL) suppository 650 mg  650 mg Rectal Q6H PRN Opyd, Ilene Qua, MD       amLODipine (NORVASC) tablet 10 mg  10 mg Oral Daily Margie Billet, NP   10 mg at 06/23/21 0840   apixaban (ELIQUIS) tablet 5 mg  5 mg Oral BID Nicole Kindred A, DO   5 mg at 06/23/21 1213   aspirin EC tablet 81 mg  81 mg Oral Daily Elodia Florence., MD   81 mg at 06/23/21 0841   atorvastatin (LIPITOR) tablet 80 mg  80 mg Oral Daily Lyndee Leo, RPH   80 mg at 06/23/21 0840   Chlorhexidine Gluconate Cloth 2 % PADS 6 each  6 each Topical Daily Elodia Florence., MD   6 each at 06/23/21 1056   Chlorhexidine Gluconate Cloth 2 % PADS 6 each  6 each Topical Q0600 Claudia Desanctis, MD   6 each at 06/21/21 1112   Chlorhexidine Gluconate Cloth 2 % PADS 6 each  6 each Topical Q0600 Penninger, [place], [place]   6 each at 06/23/21 0549   [START ON 06/24/2021] Darbepoetin Alfa (ARANESP) injection 100 mcg  100 mcg Intravenous Q Thu-HD Claudia Desanctis, MD       docusate sodium (COLACE) capsule 100 mg  100 mg Oral Daily [name], MD   100 mg at 06/23/21 0840   guaiFENesin-dextromethorphan (ROBITUSSIN DM) 100-10 MG/5ML syrup 15 mL  15 mL Oral Q4H PRN [name], MD       hydrALAZINE (APRESOLINE) injection 5 mg  5 mg Intravenous Q20 Min PRN Fields, Jessy Oto, MD       HYDROmorphone (DILAUDID) injection 0.5-1 mg  0.5-1 mg Intravenous Q2H PRN [name], MD       insulin aspart (novoLOG) injection 0-6 Units  0-6 Units Subcutaneous TID WC Elodia Florence., MD   1 Units at 06/19/21 1656   insulin glargine (LANTUS) injection 4 Units  4 Units Subcutaneous BID Nicole Kindred A, DO   4 Units at 06/23/21 0841   labetalol (NORMODYNE) injection 10 mg  10 mg Intravenous Q10 min PRN [name], MD       magnesium sulfate IVPB 2 g 50 mL  2 g Intravenous Daily PRN [name], MD       metoprolol succinate (TOPROL-XL) 24 hr tablet 25 mg  25 mg Oral Daily Margie Billet, NP   25 mg at 06/23/21 0841   metoprolol tartrate (LOPRESSOR) injection 2-5 mg  2-5 mg Intravenous Q2H PRN [name], MD  ondansetron (ZOFRAN) tablet 4 mg  4 mg Oral Q6H PRN Opyd, Ilene Qua, MD       Or   ondansetron (ZOFRAN) injection 4 mg  4 mg Intravenous Q6H PRN Opyd, Ilene Qua, MD   4 mg at 06/21/21 8937   oxyCODONE (Oxy IR/ROXICODONE) immediate release tablet 5-10 mg  5-10 mg Oral Q4H PRN [name], MD       pantoprazole (PROTONIX) EC tablet 40 mg  40 mg Oral Daily [name], MD   40 mg at 06/23/21 0840   phenol (CHLORASEPTIC) mouth spray 1 spray  1 spray Mouth/Throat PRN [name], MD       sodium chloride flush (NS) 0.9 % injection 3 mL  3 mL Intravenous Q12H [name], MD   3 mL at 06/23/21 0844   sodium chloride flush (NS) 0.9 % injection 3 mL  3 mL Intravenous PRN [name], MD         Discharge Medications: Please see discharge summary for a list of discharge medications.  Relevant Imaging Results:  Relevant Lab Results:   Additional Information SSN # [id number]  Dialysis @ [facility] T/TH/SAT @ 5am  Has received covid vaccine but no booster shot  Vinie Sill, [place]

## 2021-06-23 NOTE — Discharge Instructions (Addendum)
° °[facility] ° °Discharge instructions ° °Lower Extremity Bypass Surgery ° °Please refer to the following instruction for your post-procedure care. Your surgeon or physician assistant will discuss any changes with you. ° °Activity ° °You are encouraged to walk as much as you can. You can slowly return to normal activities during the month after your surgery. Avoid strenuous activity and heavy lifting until your doctor tells you it's OK. Avoid activities such as vacuuming or swinging a golf club. Do not drive until your doctor give the OK and you are no longer taking prescription pain medications. It is also normal to have difficulty with sleep habits, eating and bowel movement after surgery. These will go away with time. ° °Bathing/Showering ° °Shower daily after you go home. Do not soak in a bathtub, hot tub, or swim until the incision heals completely. ° °Incision Care ° °Clean your incision with mild soap and water. Shower every day. Pat the area dry with a clean towel. You do not need a bandage unless otherwise instructed. Do not apply any ointments or creams to your incision. If you have open wounds you will be instructed how to care for them or a visiting nurse may be arranged for you. If you have staples or sutures along your incision they will be removed at your post-op appointment. You may have skin glue on your incision. Do not peel it off. It will come off on its own in about one week. ° °Wash the groin wound with soap and water daily and pat dry. (No tub bath-only shower)  Then put a dry gauze or washcloth in the groin to keep this area dry to help prevent wound infection.  Do this daily and as needed.  Do not use Vaseline or neosporin on your incisions.  Only use soap and water on your incisions and then protect and keep dry. ° °Diet ° °Resume your normal diet. There are no special food restrictions following this procedure. A low fat/ low cholesterol diet is  recommended for all patients with vascular disease. In order to heal from your surgery, it is CRITICAL to get adequate nutrition. Your body requires vitamins, minerals, and protein. Vegetables are the best source of vitamins and minerals. Vegetables also provide the perfect balance of protein. Processed food has little nutritional value, so try to avoid this. ° °Medications ° °Resume taking all your medications unless your doctor or physician assistant tells you not to. If your incision is causing pain, you may take over-the-counter pain relievers such as acetaminophen (Tylenol). If you were prescribed a stronger pain medication, please aware these medication can cause nausea and constipation. Prevent nausea by taking the medication with a snack or meal. Avoid constipation by drinking plenty of fluids and eating foods with high amount of fiber, such as fruits, vegetables, and grains. Take Colace 100 mg (an over-the-counter stool softener) twice a day as needed for constipation.  °Do not take Tylenol if you are taking prescription pain medications. ° °Follow Up ° °Our office will schedule a follow up appointment 2-3 weeks following discharge. ° °Please call us immediately for any of the following conditions ° °•Severe or worsening pain in your legs or feet while at rest or while walking •Increase pain, redness, warmth, or drainage (pus) from your incision site(s) °Fever of 101 degree or higher °The swelling in your leg with the bypass suddenly worsens and becomes more painful than when you were in the hospital °If you have   been instructed to feel your graft pulse then you should do so every day. If you can no longer feel this pulse, call the office immediately. Not all patients are given this instruction. ° °Leg swelling is common after leg bypass surgery. ° °The swelling should improve over a few months following surgery. To improve the swelling, you may elevate your legs above the level of your heart while you are  sitting or resting. Your surgeon or physician assistant may ask you to apply an ACE wrap or wear compression (TED) stockings to help to reduce swelling. ° °Reduce your risk of vascular disease ° °Stop smoking. If you would like help call [facility] at 1-800-QUIT-NOW (1-800-784-8669) or [place] at 336-586-4000. ° °Manage your cholesterol °Maintain a desired weight °Control your diabetes weight °Control your diabetes °Keep your blood pressure down ° °If you have any questions, please call the office at [phone] ° ° ° °Information on my medicine - ELIQUIS® (apixaban) ° °Why was Eliquis® prescribed for you? °Eliquis® was prescribed for you to reduce the risk of a blood clot forming that can cause a stroke if you have a medical condition called atrial fibrillation (a type of irregular heartbeat). ° °What do You need to know about Eliquis® ? °Take your Eliquis® TWICE DAILY - one tablet in the morning and one tablet in the evening with or without food. If you have difficulty swallowing the tablet whole please discuss with your pharmacist how to take the medication safely. ° °Take Eliquis® exactly as prescribed by your doctor and DO NOT stop taking Eliquis® without talking to the doctor who prescribed the medication.  Stopping may increase your risk of developing a stroke.  Refill your prescription before you run out. ° °After discharge, you should have regular check-up appointments with your healthcare provider that is prescribing your Eliquis®.  In the future your dose may need to be changed if your kidney function or weight changes by a significant amount or as you get older. ° °What do you do if you miss a dose? °If you miss a dose, take it as soon as you remember on the same day and resume taking twice daily.  Do not take more than one dose of ELIQUIS at the same time to make up a missed dose. ° °Important Safety Information °A possible side effect of Eliquis® is bleeding. You should call your healthcare  provider right away if you experience any of the following: °? Bleeding from an injury or your nose that does not stop. °? Unusual colored urine (red or dark brown) or unusual colored stools (red or black). °? Unusual bruising for unknown reasons. °? A serious fall or if you hit your head (even if there is no bleeding). ° °Some medicines may interact with Eliquis® and might increase your risk of bleeding or clotting while on Eliquis®. To help avoid this, consult your healthcare provider or pharmacist prior to using any new prescription or non-prescription medications, including herbals, vitamins, non-steroidal anti-inflammatory drugs (NSAIDs) and supplements. ° °This website has more information on Eliquis® (apixaban): http://www.eliquis.com/eliquis/home °
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
## 2021-06-23 NOTE — TOC Benefit Eligibility Note (Signed)
Patient Teacher, English as a foreign language completed.    The patient is currently admitted and upon discharge could be taking Eliquis 5 mg.  The current 30 day co-pay is, $108.24 due to a $61.24 deductible remaining. Should be $47.00 monthly after this time.  The patient is insured through [place] [place] Patient Advocate Specialist Joice Team Direct Number: [phone]  Fax: [phone]

## 2021-06-24 DIAGNOSIS — A419 Sepsis, unspecified organism: Secondary | ICD-10-CM | POA: Diagnosis not present

## 2021-06-24 DIAGNOSIS — L0390 Cellulitis, unspecified: Secondary | ICD-10-CM | POA: Diagnosis not present

## 2021-06-24 LAB — RENAL FUNCTION PANEL
Albumin: 2.3 g/dL — ABNORMAL LOW (ref 3.5–5.0)
Anion gap: 9 (ref 5–15)
BUN: 36 mg/dL — ABNORMAL HIGH (ref 8–23)
CO2: 26 mmol/L (ref 22–32)
Calcium: 9 mg/dL (ref 8.9–10.3)
Chloride: 97 mmol/L — ABNORMAL LOW (ref 98–111)
Creatinine, Ser: 10.08 mg/dL — ABNORMAL HIGH (ref 0.61–1.24)
GFR, Estimated: 5 mL/min — ABNORMAL LOW (ref >60)
Glucose, Bld: 96 mg/dL (ref 70–99)
Phosphorus: 6.5 mg/dL — ABNORMAL HIGH (ref 2.5–4.6)
Potassium: 3.6 mmol/L (ref 3.5–5.1)
Sodium: 132 mmol/L — ABNORMAL LOW (ref 135–145)

## 2021-06-24 LAB — CBC
HCT: 22.7 % — ABNORMAL LOW (ref 39.0–52.0)
Hemoglobin: 7.3 g/dL — ABNORMAL LOW (ref 13.0–17.0)
MCH: 29.6 pg (ref 26.0–34.0)
MCHC: 32.2 g/dL (ref 30.0–36.0)
MCV: 91.9 fL (ref 80.0–100.0)
Platelets: 284 10*3/uL (ref 150–400)
RBC: 2.47 MIL/uL — ABNORMAL LOW (ref 4.22–5.81)
RDW: 17.6 % — ABNORMAL HIGH (ref 11.5–15.5)
WBC: 14.9 10*3/uL — ABNORMAL HIGH (ref 4.0–10.5)
nRBC: 0 % (ref 0.0–0.2)

## 2021-06-24 LAB — GLUCOSE, CAPILLARY
Glucose-Capillary: 102 mg/dL — ABNORMAL HIGH (ref 70–99)
Glucose-Capillary: 92 mg/dL (ref 70–99)
Glucose-Capillary: 79 mg/dL (ref 70–99)
Glucose-Capillary: 125 mg/dL — ABNORMAL HIGH (ref 70–99)
Glucose-Capillary: 176 mg/dL — ABNORMAL HIGH (ref 70–99)

## 2021-06-24 MED ORDER — ALTEPLASE 2 MG IJ SOLR: 2.0000 mg | Freq: Once | INTRAMUSCULAR | Status: DC | PRN

## 2021-06-24 MED ORDER — LIDOCAINE-PRILOCAINE 2.5-2.5 % EX CREA: 1.0000 "application " | TOPICAL_CREAM | CUTANEOUS | Status: DC | PRN

## 2021-06-24 MED ORDER — SODIUM CHLORIDE 0.9 % IV SOLN: 100.0000 mL | INTRAVENOUS | Status: DC | PRN

## 2021-06-24 MED ORDER — DARBEPOETIN ALFA 100 MCG/0.5ML IJ SOSY
PREFILLED_SYRINGE | INTRAMUSCULAR | Status: AC
  Administered 2021-06-24: 100 ug via INTRAVENOUS
  Filled 2021-06-24: qty 0.5

## 2021-06-24 MED ORDER — PENTAFLUOROPROP-TETRAFLUOROETH EX AERO: 1.0000 "application " | INHALATION_SPRAY | CUTANEOUS | Status: DC | PRN

## 2021-06-24 MED ORDER — HEPARIN SODIUM (PORCINE) 1000 UNIT/ML DIALYSIS: 1000.0000 [IU] | INTRAMUSCULAR | Status: DC | PRN

## 2021-06-24 MED ORDER — LIDOCAINE HCL (PF) 1 % IJ SOLN: 5.0000 mL | INTRAMUSCULAR | Status: DC | PRN

## 2021-06-24 NOTE — Care Management Important Message (Signed)
Important Message  Patient Details  Name: [name] MRN: [id number] Date of Birth: 06/20/1951   Medicare Important Message Given:  Yes     [name] 06/24/2021, 8:33 AM

## 2021-06-24 NOTE — Progress Notes (Signed)
[facility] Progress Note   Subjective:   Patient seen and examined at bedside during dialysis.  Tolerating treatment well.  No specific complaints.  Denies CP, SOB, n/v/d, abdominal pain, weakness and fatigue.  Lower extremity edema improving.  Objective Vitals:   06/24/21 0930 06/24/21 1000 06/24/21 1030 06/24/21 1100  BP: (!) 143/65 133/63 (!) 141/63 134/67  Pulse: (!) 52 (!) 54 (!) 57 (!) 58  Resp: 16 10 13 15   Temp:      TempSrc:      SpO2: 100%   100%  Weight:      Height:       Physical Exam General:well appearing male in NAD Heart:RRR, no mrg Lungs:CTAB anterolaterally  Abdomen:soft, NTND Extremities:+scaly skin, chronic edema b/l but appears a little better today, L foot dressed Dialysis Access: LU AVF in use   Filed Weights   06/22/21 1105 06/23/21 0500 06/24/21 0908  Weight: 90.6 kg 91.1 kg 94.3 kg    Intake/Output Summary (Last 24 hours) at 06/24/2021 1139 Last data filed at 06/23/2021 2100 Gross per 24 hour  Intake 610 ml  Output --  Net 610 ml    Additional Objective Labs: Basic Metabolic Panel: Recent Labs  Lab 06/20/21 0904 06/22/21 0236 06/23/21 0245 06/24/21 0913  NA 135 133* 132* 132*  K 4.0 3.8 3.7 3.6  CL 97* 97* 96* 97*  CO2 28 27 26 26   GLUCOSE 115* 107* 106* 96  BUN 35* 53* 27* 36*  CREATININE 7.10* 10.48* 7.36* 10.08*  CALCIUM 8.7* 8.9 8.8* 9.0  PHOS 5.3*  --   --  6.5*   Liver Function Tests: Recent Labs  Lab 06/24/21 0913  ALBUMIN 2.3*   CBC: Recent Labs  Lab 06/19/21 0002 06/21/21 0038 06/21/21 1654 06/22/21 0236 06/23/21 0245 06/24/21 0913  WBC 18.7* 16.8*  --  13.9* 15.5* 14.9*  HGB 8.7* 6.2*   < > 7.7* 8.4* 7.3*  HCT 27.3* 19.2*   < > 23.1* 25.3* 22.7*  MCV 91.3 92.3  --  90.6 91.7 91.9  PLT 187 223  --  247 251 284   < > = values in this interval not displayed.   CBG: Recent Labs  Lab 06/23/21 0616 06/23/21 1115 06/23/21 1607 06/23/21 2152 06/24/21 0641  GLUCAP 106* 113* 129* 101*  102*    Medications:  sodium chloride     sodium chloride     sodium chloride     sodium chloride     magnesium sulfate bolus IVPB      amLODipine  10 mg Oral Daily   apixaban  5 mg Oral BID   aspirin EC  81 mg Oral Daily   atorvastatin  80 mg Oral Daily   Chlorhexidine Gluconate Cloth  6 each Topical Daily   Chlorhexidine Gluconate Cloth  6 each Topical Q0600   Chlorhexidine Gluconate Cloth  6 each Topical Q0600   Chlorhexidine Gluconate Cloth  6 each Topical Q0600   darbepoetin (ARANESP) injection - DIALYSIS  100 mcg Intravenous Q Thu-HD   docusate sodium  100 mg Oral Daily   insulin aspart  0-6 Units Subcutaneous TID WC   insulin glargine  4 Units Subcutaneous BID   metoprolol succinate  25 mg Oral Daily   pantoprazole  40 mg Oral Daily   sodium chloride flush  3 mL Intravenous Q12H    Dialysis Orders: TTS at [facility], missed HD 7/2, last session there on 6/30 4hr, 200dialyzer, 450/A1.5, EDW 95.5kg, 2K/2Ca, AVF, heparin 3000 bolus -  Sensipar 60mg  PO q HD - Hectoral 69mcg IV q HD - Mircera 20mcg IV q 2 weeks (last 6/30)   Assessment/Plan: AMS:  Presumed sepsis from foot infection and A fib. Appears resolved. A Fib - cardiology consulted. Started metoprolol 25mg  BID for rate control, changed to metoprolol XL 25mg  qd to avoid bradycardia. In NSR. Plan to start Eliquis when anemia stabilized. To f/u with cardiology as OP.  PAD/B foot wounds/R shallow ulcer, L 3rd/4th toe gangrene:  Blood Cx (-). VVS consulted-s/p left popliteal-PT bypass with ipsilateral greater saphenous vein and toe amputation performed on 7/8. On vanc/cefepime/flagyl. Per PMD/VVS. ESRD:  on HD TTS.  HD today per regular schedule. Holding heparin x1 week with recent surgery.  Hypertension/volume: BP remains elevated. Under EDW if weights correct, chronic LE edema noted on exam appears a little better today, continue to push down EDW as tolerated. Lower dry weight on d/c. Anemia CKD:  Hgb drop 6.2 on 7/11, 1 unit  pRBC given, initially improved to 8.4 and down to 7.3 today.  Aranesp 134mcg qwk ordered for today.  Metabolic bone disease: Ca/Phos ok, continue home meds.  T2DM - per primary team Nutrition - Renal diet w/fluid restrictions.  Dispo - waiting for SNF placement.   Jen Mow, PA-C [facility] 06/24/2021,11:39 AM  LOS: 10 days
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
## 2021-06-24 NOTE — Progress Notes (Signed)
OT Cancellation Note  Patient Details Name: [name] MRN: [id number] DOB: 1951/05/19   Cancelled Treatment:      OT order received. Pt currently off the floor at HD procedure. Will hold evaluation at this time.   Minus Breeding, MSOT, OTR/L  [facility]  [phone]   Marius Ditch 06/24/2021, 11:25 AM

## 2021-06-24 NOTE — Progress Notes (Signed)
Pt came back to rm 6 from dialysis. Reinitiated tele. VSS. Call bell within reach .   Lavenia Atlas, RN
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
## 2021-06-24 NOTE — TOC Progression Note (Signed)
Transition of Care [facility]) - Progression Note    Patient Details  Name: [name] MRN: [id number] Date of Birth: 08-15-1951  Transition of Care [facility]) CM/SW [place], [place] Phone Number: 06/24/2021, 4:14 PM  Clinical Narrative:     CSW met with patient at bedside. Informed patient his insurance is requesting updated PT note  to determine approval for SNF. CSW encourage patient to participate with PT. Patient states understanding.  Thurmond Butts, MSW, LCSW Clinical Social Worker    Expected Discharge Plan: Skilled Nursing Facility Barriers to Discharge: Ship broker, Continued Medical Work up, SNF Pending bed offer  Expected Discharge Plan and Services Expected Discharge Plan: [place] In-house Referral: Clinical Social Work     Living arrangements for the past 2 months: No permanent address, Homeless                                       Social Determinants of Health (SDOH) Interventions    Readmission Risk Interventions No flowsheet data found.

## 2021-06-24 NOTE — Progress Notes (Signed)
PT Cancellation Note  Patient Details Name: [name] MRN: [id number] DOB: 21-Aug-1951   Cancelled Treatment:    Reason Eval/Treat Not Completed: Pain limiting ability to participate. Pt in HD earlier and now he declines due to pain. Will continue attempts.   Shary Decamp [facility] 06/24/2021, 3:58 PM Ustin Cruickshank [place] Pager [phone] Office [phone]

## 2021-06-24 NOTE — TOC Progression Note (Signed)
Transition of Care [facility]) - Progression Note    Patient Details  Name: LEMOINE GOYNE MRN: [id number] Date of Birth: February 05, 1951  Transition of Care [facility]) CM/SW [place], [place] Phone Number: 06/24/2021, 1:17 PM  Clinical Narrative:     CSW started insurance auth for [place] reference # [id number] CSW informed Wandra Feinstein- Patient received dialysis TTS @ [facility] @ 5am-per patient   Insurance called and requested updated PT note - CSW informed RN  CSW will continue to follow and assist with discharge planning  Thurmond Butts, MSW, LCSW Clinical Social Worker     Expected Discharge Plan: Skilled Nursing Facility Barriers to Discharge: Ship broker, Continued Medical Work up, SNF Pending bed offer  Expected Discharge Plan and Services Expected Discharge Plan: [place] In-house Referral: Clinical Social Work     Living arrangements for the past 2 months: No permanent address, Homeless                                       Social Determinants of Health (SDOH) Interventions    Readmission Risk Interventions No flowsheet data found.

## 2021-06-25 DIAGNOSIS — A419 Sepsis, unspecified organism: Secondary | ICD-10-CM | POA: Diagnosis not present

## 2021-06-25 DIAGNOSIS — L0390 Cellulitis, unspecified: Secondary | ICD-10-CM | POA: Diagnosis not present

## 2021-06-25 LAB — BASIC METABOLIC PANEL
Anion gap: 6 (ref 5–15)
BUN: 17 mg/dL (ref 8–23)
CO2: 30 mmol/L (ref 22–32)
Calcium: 9 mg/dL (ref 8.9–10.3)
Chloride: 99 mmol/L (ref 98–111)
Creatinine, Ser: 6.62 mg/dL — ABNORMAL HIGH (ref 0.61–1.24)
GFR, Estimated: 8 mL/min — ABNORMAL LOW (ref >60)
Glucose, Bld: 126 mg/dL — ABNORMAL HIGH (ref 70–99)
Potassium: 3.9 mmol/L (ref 3.5–5.1)
Sodium: 135 mmol/L (ref 135–145)

## 2021-06-25 LAB — GLUCOSE, CAPILLARY
Glucose-Capillary: 133 mg/dL — ABNORMAL HIGH (ref 70–99)
Glucose-Capillary: 141 mg/dL — ABNORMAL HIGH (ref 70–99)
Glucose-Capillary: 142 mg/dL — ABNORMAL HIGH (ref 70–99)
Glucose-Capillary: 124 mg/dL — ABNORMAL HIGH (ref 70–99)

## 2021-06-25 LAB — CBC
HCT: 24.4 % — ABNORMAL LOW (ref 39.0–52.0)
Hemoglobin: 7.8 g/dL — ABNORMAL LOW (ref 13.0–17.0)
MCH: 29.9 pg (ref 26.0–34.0)
MCHC: 32 g/dL (ref 30.0–36.0)
MCV: 93.5 fL (ref 80.0–100.0)
Platelets: 307 10*3/uL (ref 150–400)
RBC: 2.61 MIL/uL — ABNORMAL LOW (ref 4.22–5.81)
RDW: 18.2 % — ABNORMAL HIGH (ref 11.5–15.5)
WBC: 14 10*3/uL — ABNORMAL HIGH (ref 4.0–10.5)
nRBC: 0 % (ref 0.0–0.2)

## 2021-06-25 MED ORDER — SUCROFERRIC OXYHYDROXIDE 500 MG PO CHEW
500.0000 mg | CHEWABLE_TABLET | Freq: Three times a day (TID) | ORAL | Status: DC
  Administered 2021-06-25: 500 mg via ORAL
  Filled 2021-06-25 (×7): qty 1
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
## 2021-06-25 NOTE — Progress Notes (Signed)
Subjective: Sitting up in chair eating lunch, appears and talking appropriate and no complaints currently, discussed need for UF as tolerated for lower extremity edema .[name]  He wants to do only 3 hours dialysis as he does at OP center (cut this time by at least an hour almost every treatment =signs of early .I  discussed need to stay his 4-hour treatment to help decrease cramps and UF appropriately  Objective Vital signs in last 24 hours: Vitals:   06/24/21 2338 06/25/21 0346 06/25/21 0807 06/25/21 1130  BP: (!) 129/55 (!) 136/53 (!) 150/57 (!) 115/50  Pulse:  63 66 63  Resp: 20 19 18 17   Temp: 98 F (36.7 C) 97.9 F (36.6 C) 98.7 F (37.1 C) 98.4 F (36.9 C)  TempSrc: Oral Oral Oral Oral  SpO2: 95% 96% 100% 96%  Weight:  92.5 kg    Height:       Weight change:   Physical Exam General: Elderly male in NAD Heart:RRR, no mrg Lungs:CTAB anterolaterally  Abdomen:soft, NTND Extremities:+scaly skin, chronic edema b/l but appears a little better today, L foot dressed dry and clean Dialysis Access: LU AVF positive bruit     Dialysis Orders: TTS at [facility], missed HD 7/2, last session there on 6/30 4hr, 200dialyzer, 450/A1.5, EDW 95.5kg, 2K/2Ca, AVF, heparin 3000 bolus - Sensipar 60mg  PO q HD - Hectoral 43mcg IV q HD - Mircera 9mcg IV q 2 weeks (last 6/30)   Problem/Plan: AMS:  Presumed sepsis from foot infection and A fib. Appears resolved. A Fib - cardiology consulted. Started metoprolol 25mg  BID for rate control, changed to metoprolol XL 25mg  qd to avoid bradycardia. In NSR. Plan to start Eliquis when anemia stabilized. To f/u with cardiology as OP.  PAD/B foot wounds/R shallow ulcer, L 3rd/4th toe gangrene:  Blood Cx (-). VVS consulted-s/p left popliteal-PT bypass with ipsilateral greater saphenous vein and toe amputation performed on 7/8. On vanc/cefepime/flagyl. Per PMD/VVS. ESRD:  on HD TTS.  HD  per regular schedule. Holding heparin x1 week with recent surgery.   Hypertension/volume: BP improved past 24 hours / Under EDW if weights correct, chronic LE edema noted on exam appears a little better today, continue to push down EDW as tolerated. Lower dry weight on d/c. Anemia CKD:  Hgb drop 6.2 on 7/11, 1 unit pRBC given, initially improved to 8.4 and down to 7.3 today.  Aranesp 161mcg qwk given 7/14, next HD check iron studies  Metabolic bone disease: Ca/corrected 10.1 , Phos 6.1 not on any binders will start Velphoro that is what he says he takes at home   T2DM - per primary team Nutrition - Renal diet w/fluid restrictions.  Dispo - waiting for SNF placement.          [name], PA-C [facility] Beeper [phone] 06/25/2021,12:57 PM  LOS: 11 days   Labs: Basic Metabolic Panel: Recent Labs  Lab 06/20/21 0904 06/22/21 0236 06/23/21 0245 06/24/21 0913 06/25/21 0103  NA 135   < > 132* 132* 135  K 4.0   < > 3.7 3.6 3.9  CL 97*   < > 96* 97* 99  CO2 28   < > 26 26 30   GLUCOSE 115*   < > 106* 96 126*  BUN 35*   < > 27* 36* 17  CREATININE 7.10*   < > 7.36* 10.08* 6.62*  CALCIUM 8.7*   < > 8.8* 9.0 9.0  PHOS 5.3*  --   --  6.5*  --    < > =  values in this interval not displayed.   Liver Function Tests: Recent Labs  Lab 06/24/21 0913  ALBUMIN 2.3*   No results for input(s): LIPASE, AMYLASE in the last 168 hours. No results for input(s): AMMONIA in the last 168 hours. CBC: Recent Labs  Lab 06/21/21 0038 06/21/21 1654 06/22/21 0236 06/23/21 0245 06/24/21 0913 06/25/21 0103  WBC 16.8*  --  13.9* 15.5* 14.9* 14.0*  HGB 6.2*   < > 7.7* 8.4* 7.3* 7.8*  HCT 19.2*   < > 23.1* 25.3* 22.7* 24.4*  MCV 92.3  --  90.6 91.7 91.9 93.5  PLT 223  --  247 251 284 307   < > = values in this interval not displayed.   Cardiac Enzymes: No results for input(s): CKTOTAL, CKMB, CKMBINDEX, TROPONINI in the last 168 hours. CBG: Recent Labs  Lab 06/24/21 1339 06/24/21 1607 06/24/21 2029 06/25/21 0558 06/25/21 1133  GLUCAP  79 125* 176* 133* 141*    Studies/Results: No results found. Medications:  sodium chloride     sodium chloride     magnesium sulfate bolus IVPB      amLODipine  10 mg Oral Daily   apixaban  5 mg Oral BID   aspirin EC  81 mg Oral Daily   atorvastatin  80 mg Oral Daily   Chlorhexidine Gluconate Cloth  6 each Topical Daily   Chlorhexidine Gluconate Cloth  6 each Topical Q0600   Chlorhexidine Gluconate Cloth  6 each Topical Q0600   Chlorhexidine Gluconate Cloth  6 each Topical Q0600   darbepoetin (ARANESP) injection - DIALYSIS  100 mcg Intravenous Q Thu-HD   docusate sodium  100 mg Oral Daily   insulin aspart  0-6 Units Subcutaneous TID WC   insulin glargine  4 Units Subcutaneous BID   metoprolol succinate  25 mg Oral Daily   pantoprazole  40 mg Oral Daily   sodium chloride flush  3 mL Intravenous Q12H

## 2021-06-25 NOTE — TOC Progression Note (Signed)
Transition of Care [facility]) - Progression Note    Patient Details  Name: [name] MRN: [id number] Date of Birth: 1950/12/22  Transition of Care [facility]) CM/SW [place], [place] Phone Number: 06/25/2021, 11:58 AM  Clinical Narrative:     Faxed updated PT/OT notes  to [facility] - insurance remains pending  Thurmond Butts, MSW, LCSW Clinical Social Worker    Expected Discharge Plan: Skilled Nursing Facility Barriers to Discharge: Ship broker, Continued Medical Work up, SNF Pending bed offer  Expected Discharge Plan and Services Expected Discharge Plan: [place] In-house Referral: Clinical Social Work     Living arrangements for the past 2 months: No permanent address, Homeless                                       Social Determinants of Health (SDOH) Interventions    Readmission Risk Interventions No flowsheet data found.
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
## 2021-06-25 NOTE — Progress Notes (Addendum)
  Progress Note    06/25/2021 7:51 AM 7 Days Post-Op  Subjective:  no complaints.  Afebrile   Vitals:   06/24/21 2338 06/25/21 0346  BP: (!) 129/55 (!) 136/53  Pulse:  63  Resp: 20 19  Temp: 98 F (36.7 C) 97.9 F (36.6 C)  SpO2: 95% 96%    Physical Exam: Cardiac:  regular Lungs:  non labored Incisions:  bypass incisions look good.   Extremities:  brisk left PT doppler signal  CBC    Component Value Date/Time   WBC 14.0 (H) 06/25/2021 0103   RBC 2.61 (L) 06/25/2021 0103   HGB 7.8 (L) 06/25/2021 0103   HGB 11.8 (L) 03/03/2017 1635   HGB 9.4 (L) 07/03/2014 0812   HCT 24.4 (L) 06/25/2021 0103   HCT 26.6 (L) 03/22/2017 1207   HCT 29.0 (L) 07/03/2014 0812   PLT 307 06/25/2021 0103   PLT 170 03/03/2017 1635   MCV 93.5 06/25/2021 0103   MCV 91 03/03/2017 1635   MCV 86.1 07/03/2014 0812   MCH 29.9 06/25/2021 0103   MCHC 32.0 06/25/2021 0103   RDW 18.2 (H) 06/25/2021 0103   RDW 16.9 (H) 03/03/2017 1635   RDW 16.3 (H) 07/03/2014 0812   LYMPHSABS 1.4 06/14/2021 1635   LYMPHSABS 1.5 07/03/2014 0812   MONOABS 1.3 (H) 06/14/2021 1635   MONOABS 0.7 07/03/2014 0812   EOSABS 0.2 06/14/2021 1635   EOSABS 0.6 (H) 07/03/2014 0812   BASOSABS 0.0 06/14/2021 1635   BASOSABS 0.1 07/03/2014 0812    BMET    Component Value Date/Time   NA 135 06/25/2021 0103   NA 138 03/03/2017 1635   NA 142 07/03/2014 0814   K 3.9 06/25/2021 0103   K 4.4 07/03/2014 0814   CL 99 06/25/2021 0103   CO2 30 06/25/2021 0103   CO2 20 (L) 07/03/2014 0814   GLUCOSE 126 (H) 06/25/2021 0103   GLUCOSE 136 07/03/2014 0814   BUN 17 06/25/2021 0103   BUN 36 (H) 03/03/2017 1635   BUN 93.6 (H) 07/03/2014 0814   CREATININE 6.62 (H) 06/25/2021 0103   CREATININE 7.3 (HH) 07/03/2014 0814   CALCIUM 9.0 06/25/2021 0103   CALCIUM 8.6 07/03/2014 0814   GFRNONAA 8 (L) 06/25/2021 0103   GFRNONAA 31 (L) 08/16/2012 1517   GFRAA 6 (L) 09/12/2018 0443   GFRAA 36 (L) 08/16/2012 1517    INR     Component Value Date/Time   INR 1.00 07/10/2018 1654     Intake/Output Summary (Last 24 hours) at 06/25/2021 0751 Last data filed at 06/25/2021 5427 Gross per 24 hour  Intake 490 ml  Output 3000 ml  Net -2510 ml     Assessment/Plan:  70 y.o. male is s/p:  Left below-knee popliteal to posterior tibial artery bypass with 9 reversed left greater saphenous vein and Amputation transmetatarsal of toes 2 3 and 4 left foot  7 Days Post-Op   -pt continues to have brisk left PT doppler signal -bypass incisions healing nicely. -leukocytosis improving -toe amp site healing nicely -will check back on pt Monday if he is still here.  If any concerns over weekend, please call Vascular MD on call if any issues.  -continue dry dressing to foot daily.   Leontine Locket, PA-C [facility] [phone] 06/25/2021 7:51 AM  Agree with above. Brisk PT doppler Incisions healing Will recheck Monday if does not go to SNF  Ruta Hinds, MD [facility] [place] Office: [phone]

## 2021-06-26 LAB — CBC
HCT: 22.4 % — ABNORMAL LOW (ref 39.0–52.0)
Hemoglobin: 7.2 g/dL — ABNORMAL LOW (ref 13.0–17.0)
MCH: 30.3 pg (ref 26.0–34.0)
MCHC: 32.1 g/dL (ref 30.0–36.0)
MCV: 94.1 fL (ref 80.0–100.0)
Platelets: 273 10*3/uL (ref 150–400)
RBC: 2.38 MIL/uL — ABNORMAL LOW (ref 4.22–5.81)
RDW: 18.4 % — ABNORMAL HIGH (ref 11.5–15.5)
WBC: 13.8 10*3/uL — ABNORMAL HIGH (ref 4.0–10.5)
nRBC: 0 % (ref 0.0–0.2)

## 2021-06-26 LAB — GLUCOSE, CAPILLARY
Glucose-Capillary: 106 mg/dL — ABNORMAL HIGH (ref 70–99)
Glucose-Capillary: 92 mg/dL (ref 70–99)
Glucose-Capillary: 138 mg/dL — ABNORMAL HIGH (ref 70–99)
Glucose-Capillary: 115 mg/dL — ABNORMAL HIGH (ref 70–99)

## 2021-06-26 LAB — IRON AND TIBC
Iron: 48 ug/dL (ref 45–182)
Saturation Ratios: 28 % (ref 17.9–39.5)
TIBC: 172 ug/dL — ABNORMAL LOW (ref 250–450)
UIBC: 124 ug/dL

## 2021-06-26 NOTE — Progress Notes (Signed)
Subjective: On hemodialysis, no complaints but states he will sign off after 3 hours despite my counseling to stay hold treatment  Objective Vital signs in last 24 hours: Vitals:   06/26/21 1000 06/26/21 1030 06/26/21 1100 06/26/21 1130  BP: (!) 130/56 (!) 129/57 126/63 (!) 119/57  Pulse: (!) 58 60 61 64  Resp: 16 17 15  (!) 21  Temp:      TempSrc:      SpO2: 100% 100% 100% 99%  Weight:      Height:       Weight change: -0.7 kg  Physical Exam General: Elderly male in NAD currently on dialysis Heart:RRR, no mrg Lungs:CTAB anterolaterally  Abdomen:soft, NTND Extremities:+scaly skin, chronic edema b/l but appears a little better today, L foot dressed dry and clean Dialysis Access: LU AVF patent on HD     Dialysis Orders: TTS at [facility], missed HD 7/2, last session there on 6/30 4hr, 200dialyzer, 450/A1.5, EDW 95.5kg, 2K/2Ca, AVF, heparin 3000 bolus - Sensipar 60mg  PO q HD - Hectoral 40mcg IV q HD - Mircera 21mcg IV q 2 weeks (last 6/30)   Problem/Plan: AMS:  Presumed sepsis from foot infection and A fib. Appears resolved. A Fib - cardiology consulted. Started metoprolol 25mg  BID for rate control, changed to metoprolol XL 25mg  qd to avoid bradycardia. In NSR. Plan to start Eliquis when anemia stabilized. To f/u with cardiology as OP.  PAD/B foot wounds/R shallow ulcer, L 3rd/4th toe gangrene:  Blood Cx (-). VVS consulted-s/p left popliteal-PT bypass with ipsilateral greater saphenous vein and toe amputation performed on 7/8. On vanc/cefepime/flagyl. Per PMD/VVS. ESRD:  on HD TTS.  HD  per regular schedule. Holding heparin x1 week with recent surgery.  Noncompliance with dialysis treatment time even in hospital noted despite education and counseling Hypertension/volume: BP improved past 24 hours / Under EDW if weights correct, chronic LE edema noted on exam appears a little better today, continue to push down EDW as tolerated. Lower dry weight on d/c. Anemia CKD:  Hgb drop 6.2 on 7/11, 1  unit pRBC given, initially improved to 8.4 and down to 7.2 today.  Aranesp 143mcg qwk given 7/14, next HD check iron studies  Metabolic bone disease: Ca/corrected 10.1 , Phos 6.1 not on any binders will start Velphoro that is what he says he takes at home   T2DM - per primary team Nutrition - Renal diet w/fluid restrictions.  Dispo - waiting for SNF placement.   Patrick Haber, PA-C [facility] Beeper [phone] 06/26/2021,11:48 AM  LOS: 12 days   Labs: Basic Metabolic Panel: Recent Labs  Lab 06/20/21 0904 06/22/21 0236 06/23/21 0245 06/24/21 0913 06/25/21 0103  NA 135   < > 132* 132* 135  K 4.0   < > 3.7 3.6 3.9  CL 97*   < > 96* 97* 99  CO2 28   < > 26 26 30   GLUCOSE 115*   < > 106* 96 126*  BUN 35*   < > 27* 36* 17  CREATININE 7.10*   < > 7.36* 10.08* 6.62*  CALCIUM 8.7*   < > 8.8* 9.0 9.0  PHOS 5.3*  --   --  6.5*  --    < > = values in this interval not displayed.   Liver Function Tests: Recent Labs  Lab 06/24/21 0913  ALBUMIN 2.3*   No results for input(s): LIPASE, AMYLASE in the last 168 hours. No results for input(s): AMMONIA in the last 168 hours. CBC: Recent Labs  Lab 06/22/21 0236 06/23/21 0245 06/24/21 0913 06/25/21 0103 06/26/21 0106  WBC 13.9* 15.5* 14.9* 14.0* 13.8*  HGB 7.7* 8.4* 7.3* 7.8* 7.2*  HCT 23.1* 25.3* 22.7* 24.4* 22.4*  MCV 90.6 91.7 91.9 93.5 94.1  PLT 247 251 284 307 273   Cardiac Enzymes: No results for input(s): CKTOTAL, CKMB, CKMBINDEX, TROPONINI in the last 168 hours. CBG: Recent Labs  Lab 06/25/21 0558 06/25/21 1133 06/25/21 1712 06/25/21 2004 06/26/21 0624  GLUCAP 133* 141* 142* 124* 106*    Studies/Results: No results found. Medications:  sodium chloride     sodium chloride     magnesium sulfate bolus IVPB      amLODipine  10 mg Oral Daily   apixaban  5 mg Oral BID   aspirin EC  81 mg Oral Daily   atorvastatin  80 mg Oral Daily   Chlorhexidine Gluconate Cloth  6 each Topical Daily    Chlorhexidine Gluconate Cloth  6 each Topical Q0600   Chlorhexidine Gluconate Cloth  6 each Topical Q0600   Chlorhexidine Gluconate Cloth  6 each Topical Q0600   darbepoetin (ARANESP) injection - DIALYSIS  100 mcg Intravenous Q Thu-HD   docusate sodium  100 mg Oral Daily   insulin aspart  0-6 Units Subcutaneous TID WC   insulin glargine  4 Units Subcutaneous BID   metoprolol succinate  25 mg Oral Daily   pantoprazole  40 mg Oral Daily   sodium chloride flush  3 mL Intravenous Q12H   sucroferric oxyhydroxide  500 mg Oral TID WC

## 2021-06-26 NOTE — TOC Progression Note (Signed)
Transition of Care [facility]) - Progression Note    Patient Details  Name: [name] MRN: [id number] Date of Birth: [birth year]  Transition of Care [facility]) CM/SW [place], [place] Phone Number: [phone] 06/26/2021, 2:01 PM  Clinical Narrative:     CSW spoke with Shirlee Limerick at [facility] and [facility] inquired about bed readiness. Pt will need new COVID and can be discharged tomorrow morning.  MD updated.  TOC team will continue to assist with discharge planning needs.   Expected Discharge Plan: Skilled Nursing Facility Barriers to Discharge: Ship broker, Continued Medical Work up, SNF Pending bed offer  Expected Discharge Plan and Services Expected Discharge Plan: [place] In-house Referral: Clinical Social Work     Living arrangements for the past 2 months: No permanent address, Homeless                                       Social Determinants of Health (SDOH) Interventions    Readmission Risk Interventions No flowsheet data found.

## 2021-06-26 NOTE — TOC Progression Note (Signed)
Transition of Care [facility]) - Progression Note    Patient Details  Name: LADEN FIELDHOUSE MRN: [id number] Date of Birth: April 09, 1951  Transition of Care [facility]) CM/SW [place], [place] Phone Number: [phone] 06/26/2021, 12:53 PM  Clinical Narrative:     CSW followed with [facility] and pt's Josem Kaufmann has been approved.  [name] #- [id number] Health Plan #- [id number] Case Manager: Cari Caraway Authorization dates: 7/14-7/17/2022 Fax Number: 1 [phone]    MD has been alerted.  TOC team will continue to assist with discharge planning needs.    Expected Discharge Plan: Skilled Nursing Facility Barriers to Discharge: Ship broker, Continued Medical Work up, SNF Pending bed offer  Expected Discharge Plan and Services Expected Discharge Plan: [place] In-house Referral: Clinical Social Work     Living arrangements for the past 2 months: No permanent address, Homeless                                       Social Determinants of Health (SDOH) Interventions    Readmission Risk Interventions No flowsheet data found.
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
## 2021-06-27 LAB — CBC
HCT: 23.5 % — ABNORMAL LOW (ref 39.0–52.0)
Hemoglobin: 7.4 g/dL — ABNORMAL LOW (ref 13.0–17.0)
MCH: 29.8 pg (ref 26.0–34.0)
MCHC: 31.5 g/dL (ref 30.0–36.0)
MCV: 94.8 fL (ref 80.0–100.0)
Platelets: 283 10*3/uL (ref 150–400)
RBC: 2.48 MIL/uL — ABNORMAL LOW (ref 4.22–5.81)
RDW: 18.6 % — ABNORMAL HIGH (ref 11.5–15.5)
WBC: 14.3 10*3/uL — ABNORMAL HIGH (ref 4.0–10.5)
nRBC: 0 % (ref 0.0–0.2)

## 2021-06-27 LAB — GLUCOSE, CAPILLARY
Glucose-Capillary: 102 mg/dL — ABNORMAL HIGH (ref 70–99)
Glucose-Capillary: 146 mg/dL — ABNORMAL HIGH (ref 70–99)
Glucose-Capillary: 103 mg/dL — ABNORMAL HIGH (ref 70–99)

## 2021-06-27 LAB — SARS CORONAVIRUS 2 (TAT 6-24 HRS): SARS Coronavirus 2: NEGATIVE

## 2021-06-27 MED ORDER — ONDANSETRON HCL 4 MG PO TABS: 4.0000 mg | ORAL_TABLET | Freq: Four times a day (QID) | ORAL | 0 refills | Status: DC | PRN

## 2021-06-27 MED ORDER — ACETAMINOPHEN 325 MG PO TABS: 650.0000 mg | ORAL_TABLET | Freq: Four times a day (QID) | ORAL | Status: DC | PRN

## 2021-06-27 MED ORDER — ATORVASTATIN CALCIUM 80 MG PO TABS: 80.0000 mg | ORAL_TABLET | Freq: Every day | ORAL | Status: DC

## 2021-06-27 MED ORDER — SUCROFERRIC OXYHYDROXIDE 500 MG PO CHEW: 500.0000 mg | CHEWABLE_TABLET | Freq: Three times a day (TID) | ORAL | Status: DC

## 2021-06-27 MED ORDER — NEPRO/CARBSTEADY PO LIQD: 237.0000 mL | Freq: Two times a day (BID) | ORAL | Status: DC

## 2021-06-27 MED ORDER — ASPIRIN 81 MG PO TBEC: 81.0000 mg | DELAYED_RELEASE_TABLET | Freq: Every day | ORAL | 11 refills | Status: DC

## 2021-06-27 MED ORDER — PANTOPRAZOLE SODIUM 40 MG PO TBEC
40.0000 mg | DELAYED_RELEASE_TABLET | Freq: Every day | ORAL | Status: DC
Start: 2021-06-28 — End: 2023-04-14

## 2021-06-27 MED ORDER — NEPRO/CARBSTEADY PO LIQD: 237.0000 mL | Freq: Two times a day (BID) | ORAL | 0 refills | Status: DC

## 2021-06-27 MED ORDER — DARBEPOETIN ALFA 100 MCG/0.5ML IJ SOSY
100.0000 ug | PREFILLED_SYRINGE | INTRAMUSCULAR | Status: DC
Start: 2021-07-01 — End: 2021-11-29

## 2021-06-27 MED ORDER — APIXABAN 5 MG PO TABS: 5.0000 mg | ORAL_TABLET | Freq: Two times a day (BID) | ORAL | Status: DC

## 2021-06-27 MED ORDER — METOPROLOL SUCCINATE ER 25 MG PO TB24: 25.0000 mg | ORAL_TABLET | Freq: Every day | ORAL | Status: DC

## 2021-06-27 MED ORDER — DOCUSATE SODIUM 100 MG PO CAPS: 100.0000 mg | ORAL_CAPSULE | Freq: Every day | ORAL | 0 refills | Status: DC

## 2021-06-27 MED ORDER — INSULIN GLARGINE 100 UNIT/ML ~~LOC~~ SOLN: 4.0000 [IU] | Freq: Two times a day (BID) | SUBCUTANEOUS | 11 refills | Status: DC

## 2021-06-27 MED ORDER — GUAIFENESIN-DM 100-10 MG/5ML PO SYRP: 15.0000 mL | ORAL_SOLUTION | ORAL | 0 refills | Status: DC | PRN

## 2021-06-27 NOTE — Progress Notes (Addendum)
Subjective: Said tolerated his 3 hours of dialysis yesterday, signed off hour early refuses stay more than 3 hours also RN states refusing phosphate binders.  I reeducated importance of phosphate binder to patient  Objective Vital signs in last 24 hours: Vitals:   06/26/21 2351 06/27/21 0418 06/27/21 0738 06/27/21 0952  BP: 127/68 (!) 128/54 (!) 140/95 (!) 130/56  Pulse: 67 62 70 63  Resp: 15 18 18    Temp: 98.2 F (36.8 C) 98.5 F (36.9 C) 99.8 F (37.7 C)   TempSrc: Oral Oral Oral   SpO2: 100% 100% 100%   Weight:  89.5 kg    Height:       Weight change: 0 kg  Physical Exam General: Elderly male in NAD currently on dialysis Heart:RRR, no mrg Lungs:CTAB anterolaterally  Abdomen:soft, NTND Extremities:+scaly skin, chronic edema b/l but appears a little better today, L foot dressed dry and clean Dialysis Access: LU AVF patent on HD     Dialysis Orders: TTS at [facility], missed HD 7/2, last session there on 6/30 4hr, 200dialyzer, 450/A1.5, EDW 95.5kg, 2K/2Ca, AVF, heparin 3000 bolus - Sensipar 60mg  PO q HD - Hectoral 79mcg IV q HD - Mircera 37mcg IV q 2 weeks (last 6/30)   Problem/Plan: AMS:  Presumed sepsis from foot infection and A fib. Appears resolved. A Fib - cardiology consulted. Started metoprolol 25mg  BID for rate control, changed to metoprolol XL 25mg  qd to avoid bradycardia. In NSR. Plan to start Eliquis when anemia stabilized. To f/u with cardiology as OP.  PAD/B foot wounds/R shallow ulcer, L 3rd/4th toe gangrene:  Blood Cx (-). VVS consulted-s/p left popliteal-PT bypass with ipsilateral greater saphenous vein and toe amputation performed on 7/8. On vanc/cefepime/flagyl. Per PMD/VVS. ESRD:  on HD TTS.  HD  per regular schedule. Holding heparin x1 week with recent surgery.  Noncompliance with dialysis treatment time even in hospital will only run 3 of his 4 hours despite education and counseling Hypertension/volume: BP improved past 24 hours / Under EDW if weights correct,  chronic LE edema noted on exam appears a little better today, continue to push down EDW as tolerated. Lower dry weight on d/c. Anemia CKD:  Hgb drop 6.2 on 7/11, 1 unit pRBC given, initially improved to 8.4 and down to 7.4 today.  Aranesp 12mcg qwk given 7/14, iron sat 28% we will hold on IV iron with current infection   Metabolic bone disease: Ca/corrected 10.4 , not currently on outpatient Hectorol 8 MCG q. HD follow-up calcium level ,Phos 6.5 have started Velphoro which he said he takes at home but he now refuses, reeducated need for binder to patient   T2DM - per primary team Nutrition -ALB 2.3 renal diet w/fluid restrictions.  Add Nepro supplement Dispo - waiting for SNF placement.  [name], PA-C [facility] [phone] 06/27/2021,12:15 PM  LOS: 13 days   Labs: Basic Metabolic Panel: Recent Labs  Lab 06/23/21 0245 06/24/21 0913 06/25/21 0103  NA 132* 132* 135  K 3.7 3.6 3.9  CL 96* 97* 99  CO2 26 26 30   GLUCOSE 106* 96 126*  BUN 27* 36* 17  CREATININE 7.36* 10.08* 6.62*  CALCIUM 8.8* 9.0 9.0  PHOS  --  6.5*  --    Liver Function Tests: Recent Labs  Lab 06/24/21 0913  ALBUMIN 2.3*   No results for input(s): LIPASE, AMYLASE in the last 168 hours. No results for input(s): AMMONIA in the last 168 hours. CBC: Recent Labs  Lab 06/23/21 0245  06/24/21 0913 06/25/21 0103 06/26/21 0106 06/27/21 0047  WBC 15.5* 14.9* 14.0* 13.8* 14.3*  HGB 8.4* 7.3* 7.8* 7.2* 7.4*  HCT 25.3* 22.7* 24.4* 22.4* 23.5*  MCV 91.7 91.9 93.5 94.1 94.8  PLT 251 284 307 273 283   Cardiac Enzymes: No results for input(s): CKTOTAL, CKMB, CKMBINDEX, TROPONINI in the last 168 hours. CBG: Recent Labs  Lab 06/26/21 1214 06/26/21 1624 06/26/21 2139 06/27/21 0608 06/27/21 0948  GLUCAP 92 138* 115* 102* 146*    Studies/Results: No results found. Medications:  sodium chloride     sodium chloride     magnesium sulfate bolus IVPB      amLODipine  10 mg Oral Daily    apixaban  5 mg Oral BID   aspirin EC  81 mg Oral Daily   atorvastatin  80 mg Oral Daily   Chlorhexidine Gluconate Cloth  6 each Topical Daily   Chlorhexidine Gluconate Cloth  6 each Topical Q0600   Chlorhexidine Gluconate Cloth  6 each Topical Q0600   Chlorhexidine Gluconate Cloth  6 each Topical Q0600   darbepoetin (ARANESP) injection - DIALYSIS  100 mcg Intravenous Q Thu-HD   docusate sodium  100 mg Oral Daily   insulin aspart  0-6 Units Subcutaneous TID WC   insulin glargine  4 Units Subcutaneous BID   metoprolol succinate  25 mg Oral Daily   pantoprazole  40 mg Oral Daily   sodium chloride flush  3 mL Intravenous Q12H   sucroferric oxyhydroxide  500 mg Oral TID WC

## 2021-06-27 NOTE — Progress Notes (Signed)
Pt discharged, per order. IV and telemetry removed. AVS sent with transport. Attempted report to facility with no response.

## 2021-06-27 NOTE — Progress Notes (Signed)
Called facility to give report , second attempt. Transferred to a voicemail. Attempt was unsuccessful.

## 2021-06-27 NOTE — TOC Progression Note (Signed)
Transition of Care [facility]) - Progression Note    Patient Details  Name: [name] MRN: [id number] Date of Birth: 1951/06/29  Transition of Care [facility]) CM/SW [place], [place] Phone Number: [phone] 06/27/2021, 1:31 PM  Clinical Narrative:    Patient will DC to:?[place] Anticipated DC date:?06/27/2021 Family notified:?[place] Transport by: Corey Harold   Per MD patient ready for DC to [facility]. RN, patient, patient's family, and facility notified of DC. Discharge Summary sent to facility. RN given number for report  [phone] room 122. DC packet on chart. Ambulance transport requested for patient.   CSW signing off.   [place], [place] [phone]    Expected Discharge Plan: Skilled Nursing Facility Barriers to Discharge: Barriers Resolved  Expected Discharge Plan and Services Expected Discharge Plan: [place] In-house Referral: Clinical Social Work     Living arrangements for the past 2 months: No permanent address, Homeless Expected Discharge Date: 06/27/21                                     Social Determinants of Health (SDOH) Interventions    Readmission Risk Interventions No flowsheet data found.

## 2021-06-27 NOTE — Discharge Summary (Signed)
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 function. The left ventricle has no regional wall motion abnormalities. Left ventricular diastolic parameters are indeterminate.  2. Right ventricular systolic function was not well visualized. The right ventricular size is not well visualized.  3. Left atrial size was mild to moderately dilated.  4. The mitral valve is normal in structure. Trivial mitral valve regurgitation. No evidence of mitral stenosis.  5. The aortic valve is calcified. There is moderate calcification of the aortic valve. Aortic valve regurgitation is not visualized. Mild to moderate aortic valve sclerosis/calcification is present, without any evidence of aortic stenosis.  6. The inferior vena cava is normal in size with greater than 50% respiratory variability, suggesting right atrial pressure of 3 mmHg.  Comparison(s): No significant change from prior study. Conclusion(s)/Recommendation(s): Otherwise normal echocardiogram, with minor abnormalities described in the report. FINDINGS  Left Ventricle: Left ventricular ejection fraction, by estimation, is 60 to 65%. The left ventricle has normal function. The left ventricle has no regional wall motion abnormalities. Definity contrast agent was given IV to delineate the left ventricular  endocardial borders. The left ventricular internal cavity size was normal in size. There is no left ventricular hypertrophy. Left ventricular diastolic parameters are indeterminate. Right Ventricle: The right ventricular size is not well visualized. Right vetricular wall thickness was not well visualized. Right ventricular systolic function was not well visualized. Left Atrium: Left atrial size was mild to moderately dilated. Right Atrium: Right atrial size was not well visualized. Pericardium: There is no evidence of pericardial effusion. Mitral Valve: The mitral valve is normal in structure. There is mild thickening of the mitral valve leaflet(s). There is mild calcification of the mitral valve leaflet(s). Trivial mitral valve regurgitation. No evidence of mitral valve stenosis. Tricuspid Valve: The tricuspid valve is normal in structure. Tricuspid valve regurgitation is trivial. No evidence of tricuspid stenosis. Aortic Valve:  The aortic valve is calcified. There is moderate calcification of the aortic valve. Aortic valve regurgitation is not visualized. Mild to moderate aortic valve sclerosis/calcification is present, without any evidence of aortic stenosis. Pulmonic Valve: The pulmonic valve was not well visualized. Pulmonic valve regurgitation is not visualized. No evidence of pulmonic stenosis. Aorta: The aortic root and ascending aorta are structurally normal, with no evidence of dilitation. Venous: The inferior vena cava is normal in size with greater than 50% respiratory  variability, suggesting right atrial pressure of 3 mmHg. IAS/Shunts: The interatrial septum was not well visualized.  LEFT VENTRICLE PLAX 2D LVIDd:         5.10 cm      Diastology LVIDs:         3.50 cm      LV e' medial:    6.34 cm/s LV PW:         0.70 cm      LV E/e' medial:  15.5 LV IVS:        1.00 cm      LV e' lateral:   5.18 cm/s LVOT diam:     2.00 cm      LV E/e' lateral: 18.9 LV SV:         75 LV SV Index:   34 LVOT Area:     3.14 cm  LV Volumes (MOD) LV vol d, MOD A2C: 133.0 ml LV vol d, MOD A4C: 130.0 ml LV vol s, MOD A2C: 54.7 ml LV vol s, MOD A4C: 62.3 ml LV SV MOD A2C:     78.3 ml LV SV MOD A4C:     130.0 ml LV SV MOD BP:      75.4 ml LEFT ATRIUM             Index LA diam:        3.70 cm 1.69 cm/m LA Vol (A2C):   71.4 ml 32.52 ml/m LA Vol (A4C):   43.6 ml 19.86 ml/m LA Biplane Vol: 59.0 ml 26.87 ml/m  AORTIC VALVE LVOT Vmax:   118.00 cm/s LVOT Vmean:  81.300 cm/s LVOT VTI:    0.240 m  AORTA Ao Root diam: 3.20 cm Ao Asc diam:  3.00 cm MITRAL VALVE MV Area (PHT): 3.63 cm    SHUNTS MV Decel Time: 209 msec    Systemic VTI:  0.24 m MV E velocity: 98.00 cm/s  Systemic Diam: 2.00 cm MV A velocity: 88.40 cm/s MV E/A ratio:  1.11 Buford Dresser MD Electronically signed by Buford Dresser MD Signature Date/Time: 06/16/2021/1:06:25 PM    Final    VAS US CAROTID  Result Date: 06/21/2021 Carotid Arterial Duplex Study Patient Name:  [name]  Date of Exam:   06/21/2021 Medical Rec #: [id number]        Accession #:    [id number] Date of Birth: Aug 26, 1951        Patient Gender: M Patient Age:   70Y Exam Location:  [facility] Procedure:      VAS US CAROTID Referring Phys: [id number] FANG XU --------------------------------------------------------------------------------  Indications:       Calcifications seen on recent CT spine imaging. Risk Factors:      Hypertension, Diabetes, PAD. Other Factors:     ESRD. Comparison Study:  No prior studies. Performing Technologist: Darlin Coco  RDMS,RVT  Examination Guidelines: A complete evaluation includes B-mode imaging, spectral Doppler, color Doppler, and power Doppler as needed of all accessible portions of each vessel. Bilateral testing is considered  an integral part of a complete examination. Limited examinations for reoccurring indications may be performed as noted.  Right Carotid Findings: +----------+--------+--------+--------+------------------+---------------------+           PSV cm/sEDV cm/sStenosisPlaque DescriptionComments              +----------+--------+--------+--------+------------------+---------------------+ CCA Prox  101     11                                                      +----------+--------+--------+--------+------------------+---------------------+ CCA Distal89      15                                                      +----------+--------+--------+--------+------------------+---------------------+ ICA Prox  90      25      1-39%   calcific and      Velocities may                                          irregular         underestimate degree                                                      of stenosis due to                                                        more proximal                                                             obstruction.          +----------+--------+--------+--------+------------------+---------------------+ ICA Mid   91      25                                                      +----------+--------+--------+--------+------------------+---------------------+ ICA Distal81      22                                                      +----------+--------+--------+--------+------------------+---------------------+ ECA       138                                                             +----------+--------+--------+--------+------------------+---------------------+  +----------+--------+-------+------------------------------+-------------------+  PSV cm/sEDV cmsDescribe                      Arm Pressure (mmHG) +----------+--------+-------+------------------------------+-------------------+ Subclavian260            Elevated velocities without                                                evidence of stenosis                              +----------+--------+-------+------------------------------+-------------------+ +---------+--------+--------+--------------+ VertebralPSV cm/sEDV cm/sNot identified +---------+--------+--------+--------------+  Left Carotid Findings: +----------+--------+--------+--------+----------------------+--------+           PSV cm/sEDV cm/sStenosisPlaque Description    Comments +----------+--------+--------+--------+----------------------+--------+ CCA Prox  144     10      <50%    calcific and smooth            +----------+--------+--------+--------+----------------------+--------+ CCA Distal66      9                                              +----------+--------+--------+--------+----------------------+--------+ ICA Prox  34      14      1-39%   calcific and irregular         +----------+--------+--------+--------+----------------------+--------+ ICA Distal59      22                                             +----------+--------+--------+--------+----------------------+--------+ ECA       153                     calcific and irregular         +----------+--------+--------+--------+----------------------+--------+ +----------+--------+--------+-----------------------------+-------------------+           PSV cm/sEDV cm/sDescribe                     Arm Pressure (mmHG) +----------+--------+--------+-----------------------------+-------------------+ Subclavian230             Patent, w/ AVF, calcific                                                   plaque without  elevated                                                    velocities                                       +----------+--------+--------+-----------------------------+-------------------+ +---------+--------+--+--------+--+---------+ VertebralPSV cm/s56EDV cm/s15Antegrade +---------+--------+--+--------+--+---------+   Summary: Right Carotid: Velocities in the right ICA are consistent with a 1-39% stenosis. Left Carotid: Velocities in the left ICA are consistent with a 1-39% stenosis.  Non-hemodynamically significant plaque <50% noted in the CCA. Vertebrals: Left vertebral artery demonstrates antegrade flow. Right vertebral             artery was not visualized. *See table(s) above for measurements and observations.  Electronically signed by Monica Martinez MD on 06/21/2021 at 6:54:24 PM.    Final     Hemodialysis Left lower extremity bypass and toe amputations    Subjective: Pt feeling well when seen this AM.  Says dialysis was 3 hrs yesterday and that's he wants to do because a of getting headaches that last rest of the day when he goes longer.  Denies F/C, pain, CP, SOB or other complaints today.   Discharge Exam: Vitals:   06/27/21 0952 06/27/21 1235  BP: (!) 130/56 (!) 135/58  Pulse: 63 60  Resp:  18  Temp:    SpO2:  100%   Vitals:   06/27/21 0418 06/27/21 0738 06/27/21 0952 06/27/21 1235  BP: (!) 128/54 (!) 140/95 (!) 130/56 (!) 135/58  Pulse: 62 70 63 60  Resp: 18 18  18   Temp: 98.5 F (36.9 C) 99.8 F (37.7 C)    TempSrc: Oral Oral  Oral  SpO2: 100% 100%  100%  Weight: 89.5 kg     Height:        General: Pt is alert, awake, not in acute distress Cardiovascular: RRR, S1/S2 +, no rubs, no gallops Respiratory: CTA bilaterally, no wheezing, no rhonchi Abdominal: Soft, NT, ND, bowel sounds + Extremities: R foot with clean dry intact gauze dressing in place, severe b/l LE venous stasis changes     The results of significant diagnostics from  this hospitalization (including imaging, microbiology, ancillary and laboratory) are listed below for reference.     Microbiology: Recent Results (from the past 240 hour(s))  Culture, blood (routine x 2)     Status: None   Collection Time: 06/17/21  4:25 PM   Specimen: BLOOD RIGHT HAND  Result Value Ref Range Status   Specimen Description BLOOD RIGHT HAND  Final   Special Requests   Final    BOTTLES DRAWN AEROBIC ONLY Blood Culture results may not be optimal due to an inadequate volume of blood received in culture bottles   Culture   Final    NO GROWTH 5 DAYS Performed at [facility], [place] [address], [place], [place] [address]    Report Status 06/22/2021 FINAL  Final  Culture, blood (routine x 2)     Status: None   Collection Time: 06/17/21  4:32 PM   Specimen: BLOOD RIGHT WRIST  Result Value Ref Range Status   Specimen Description BLOOD RIGHT WRIST  Final   Special Requests   Final    BOTTLES DRAWN AEROBIC AND ANAEROBIC Blood Culture results may not be optimal due to an inadequate volume of blood received in culture bottles   Culture   Final    NO GROWTH 5 DAYS Performed at [facility], [place] [address], [place], [place] [address]    Report Status 06/22/2021 FINAL  Final  Surgical pcr screen     Status: None   Collection Time: 06/18/21 11:40 AM   Specimen: Nasal Mucosa; Nasal Swab  Result Value Ref Range Status   MRSA, PCR NEGATIVE NEGATIVE Final   Staphylococcus aureus NEGATIVE NEGATIVE Final    Comment: (NOTE) The Xpert SA Assay (FDA approved for NASAL specimens in patients 51 years of age and older), is one component of a comprehensive surveillance program.  It is not intended to diagnose infection nor to guide or monitor treatment. Performed at [facility], [place] [address] [place], [place] [address]   SARS CORONAVIRUS 2 (TAT 6-24 HRS) Nasopharyngeal Nasopharyngeal Swab     Status: None   Collection Time: 06/26/21  4:47 PM   Specimen: Nasopharyngeal  Swab  Result Value Ref Range Status   SARS Coronavirus 2 NEGATIVE NEGATIVE Final    Comment: (NOTE) SARS-CoV-2 target nucleic acids are NOT DETECTED.  The SARS-CoV-2 RNA is generally detectable in upper and lower respiratory specimens during the acute phase of infection. Negative results do not preclude SARS-CoV-2 infection, do not rule out co-infections with other pathogens, and should not be used as the sole basis for treatment or other patient management decisions. Negative results must be combined with clinical observations, patient history, and epidemiological information. The expected result is Negative.  Fact Sheet for Patients: SugarRoll.be  Fact Sheet for Healthcare Providers: https://www.woods-mathews.com/  This test is not yet approved or cleared by the [place] FDA and  has been authorized for detection and/or diagnosis of SARS-CoV-2 by FDA under an Emergency Use Authorization (EUA). This EUA will remain  in effect (meaning this test can be used) for the duration of the COVID-19 declaration under Se ction 564(b)(1) of the Act, 21 U.S.C. section 360bbb-3(b)(1), unless the authorization is terminated or revoked sooner.  Performed at [facility], [place] [address], [place], [place] [address]      Labs: BNP (last 3 results) No results for input(s): BNP in the last 8760 hours. Basic Metabolic Panel: Recent Labs  Lab 06/22/21 0236 06/23/21 0245 06/24/21 0913 06/25/21 0103  NA 133* 132* 132* 135  K 3.8 3.7 3.6 3.9  CL 97* 96* 97* 99  CO2 27 26 26 30   GLUCOSE 107* 106* 96 126*  BUN 53* 27* 36* 17  CREATININE 10.48* 7.36* 10.08* 6.62*  CALCIUM 8.9 8.8* 9.0 9.0  PHOS  --   --  6.5*  --    Liver Function Tests: Recent Labs  Lab 06/24/21 0913  ALBUMIN 2.3*   No results for input(s): LIPASE, AMYLASE in the last 168 hours. No results for input(s): AMMONIA in the last 168 hours. CBC: Recent Labs  Lab  06/23/21 0245 06/24/21 0913 06/25/21 0103 06/26/21 0106 06/27/21 0047  WBC 15.5* 14.9* 14.0* 13.8* 14.3*  HGB 8.4* 7.3* 7.8* 7.2* 7.4*  HCT 25.3* 22.7* 24.4* 22.4* 23.5*  MCV 91.7 91.9 93.5 94.1 94.8  PLT 251 284 307 273 283   Cardiac Enzymes: No results for input(s): CKTOTAL, CKMB, CKMBINDEX, TROPONINI in the last 168 hours. BNP: Invalid input(s): POCBNP CBG: Recent Labs  Lab 06/26/21 1624 06/26/21 2139 06/27/21 0608 06/27/21 0948 06/27/21 1233  GLUCAP 138* 115* 102* 146* 103*   D-Dimer No results for input(s): DDIMER in the last 72 hours. Hgb A1c No results for input(s): HGBA1C in the last 72 hours. Lipid Profile No results for input(s): CHOL, HDL, LDLCALC, TRIG, CHOLHDL, LDLDIRECT in the last 72 hours. Thyroid function studies No results for input(s): TSH, T4TOTAL, T3FREE, THYROIDAB in the last 72 hours.  Invalid input(s): FREET3 Anemia work up Recent Labs    06/26/21 1639  TIBC 172*  IRON 48   Urinalysis    Component Value Date/Time   COLORURINE YELLOW 06/15/2021 0715   APPEARANCEUR HAZY (A) 06/15/2021 0715   LABSPEC 1.010 06/15/2021 0715   PHURINE 9.0 (H) 06/15/2021 0715   GLUCOSEU 50 (A) 06/15/2021 0715   HGBUR SMALL (  A) 06/15/2021 0715   HGBUR negative 04/13/2007 0821   BILIRUBINUR NEGATIVE 06/15/2021 0715   BILIRUBINUR NEG 08/21/2015 1050   KETONESUR NEGATIVE 06/15/2021 0715   PROTEINUR 100 (A) 06/15/2021 0715   UROBILINOGEN 0.2 08/21/2015 1050   UROBILINOGEN 0.2 03/07/2014 1153   NITRITE NEGATIVE 06/15/2021 0715   LEUKOCYTESUR NEGATIVE 06/15/2021 0715   Sepsis Labs Invalid input(s): PROCALCITONIN,  WBC,  LACTICIDVEN Microbiology Recent Results (from the past 240 hour(s))  Culture, blood (routine x 2)     Status: None   Collection Time: 06/17/21  4:25 PM   Specimen: BLOOD RIGHT HAND  Result Value Ref Range Status   Specimen Description BLOOD RIGHT HAND  Final   Special Requests   Final    BOTTLES DRAWN AEROBIC ONLY Blood Culture results  may not be optimal due to an inadequate volume of blood received in culture bottles   Culture   Final    NO GROWTH 5 DAYS Performed at [facility], [place] [address] [place], [place] [address]    Report Status 06/22/2021 FINAL  Final  Culture, blood (routine x 2)     Status: None   Collection Time: 06/17/21  4:32 PM   Specimen: BLOOD RIGHT WRIST  Result Value Ref Range Status   Specimen Description BLOOD RIGHT WRIST  Final   Special Requests   Final    BOTTLES DRAWN AEROBIC AND ANAEROBIC Blood Culture results may not be optimal due to an inadequate volume of blood received in culture bottles   Culture   Final    NO GROWTH 5 DAYS Performed at [facility], [place] [address] [place], [place] [address]    Report Status 06/22/2021 FINAL  Final  Surgical pcr screen     Status: None   Collection Time: 06/18/21 11:40 AM   Specimen: Nasal Mucosa; Nasal Swab  Result Value Ref Range Status   MRSA, PCR NEGATIVE NEGATIVE Final   Staphylococcus aureus NEGATIVE NEGATIVE Final    Comment: (NOTE) The Xpert SA Assay (FDA approved for NASAL specimens in patients 68 years of age and older), is one component of a comprehensive surveillance program. It is not intended to diagnose infection nor to guide or monitor treatment. Performed at [facility], [place] [address], [place], [place] [address]   SARS CORONAVIRUS 2 (TAT 6-24 HRS) Nasopharyngeal Nasopharyngeal Swab     Status: None   Collection Time: 06/26/21  4:47 PM   Specimen: Nasopharyngeal Swab  Result Value Ref Range Status   SARS Coronavirus 2 NEGATIVE NEGATIVE Final    Comment: (NOTE) SARS-CoV-2 target nucleic acids are NOT DETECTED.  The SARS-CoV-2 RNA is generally detectable in upper and lower respiratory specimens during the acute phase of infection. Negative results do not preclude SARS-CoV-2 infection, do not rule out co-infections with other pathogens, and should not be used as the sole basis for treatment or other  patient management decisions. Negative results must be combined with clinical observations, patient history, and epidemiological information. The expected result is Negative.  Fact Sheet for Patients: SugarRoll.be  Fact Sheet for Healthcare Providers: https://www.woods-mathews.com/  This test is not yet approved or cleared by the [place] FDA and  has been authorized for detection and/or diagnosis of SARS-CoV-2 by FDA under an Emergency Use Authorization (EUA). This EUA will remain  in effect (meaning this test can be used) for the duration of the COVID-19 declaration under Se ction 564(b)(1) of the Act, 21 U.S.C. section 360bbb-3(b)(1), unless the authorization  is terminated or revoked sooner.  Performed at [facility], [place] [address] [place], [place] [address]      Time coordinating discharge: Over 30 minutes  SIGNED:   Ezekiel Slocumb, DO [facility] 06/27/2021, 1:01 PM   If 7PM-7AM, please contact night-coverage [email or web address]

## 2021-06-28 DIAGNOSIS — I70562 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, left leg: Secondary | ICD-10-CM | POA: Insufficient documentation

## 2021-07-07 ENCOUNTER — Emergency Department (HOSPITAL_COMMUNITY): Payer: Medicare Other

## 2021-07-07 ENCOUNTER — Emergency Department (HOSPITAL_COMMUNITY)
Admission: EM | Admit: 2021-07-07 | Discharge: 2021-07-07 | Disposition: A | Discharge status: Home / Self Care | Payer: Medicare Other | Attending: Emergency Medicine | Admitting: Emergency Medicine

## 2021-07-07 DIAGNOSIS — Z7982 Long term (current) use of aspirin: Secondary | ICD-10-CM | POA: Diagnosis not present

## 2021-07-07 DIAGNOSIS — Z87891 Personal history of nicotine dependence: Secondary | ICD-10-CM | POA: Diagnosis not present

## 2021-07-07 DIAGNOSIS — R079 Chest pain, unspecified: Secondary | ICD-10-CM | POA: Diagnosis present

## 2021-07-07 DIAGNOSIS — I5032 Chronic diastolic (congestive) heart failure: Secondary | ICD-10-CM | POA: Diagnosis not present

## 2021-07-07 DIAGNOSIS — Z794 Long term (current) use of insulin: Secondary | ICD-10-CM | POA: Diagnosis not present

## 2021-07-07 DIAGNOSIS — R0789 Other chest pain: Secondary | ICD-10-CM | POA: Diagnosis not present

## 2021-07-07 DIAGNOSIS — Z7901 Long term (current) use of anticoagulants: Secondary | ICD-10-CM | POA: Diagnosis not present

## 2021-07-07 DIAGNOSIS — Z992 Dependence on renal dialysis: Secondary | ICD-10-CM | POA: Diagnosis not present

## 2021-07-07 DIAGNOSIS — E119 Type 2 diabetes mellitus without complications: Secondary | ICD-10-CM | POA: Insufficient documentation

## 2021-07-07 DIAGNOSIS — N186 End stage renal disease: Secondary | ICD-10-CM | POA: Diagnosis not present

## 2021-07-07 DIAGNOSIS — Z79899 Other long term (current) drug therapy: Secondary | ICD-10-CM | POA: Diagnosis not present

## 2021-07-07 DIAGNOSIS — J45909 Unspecified asthma, uncomplicated: Secondary | ICD-10-CM | POA: Diagnosis not present

## 2021-07-07 DIAGNOSIS — E875 Hyperkalemia: Secondary | ICD-10-CM | POA: Diagnosis not present

## 2021-07-07 DIAGNOSIS — I132 Hypertensive heart and chronic kidney disease with heart failure and with stage 5 chronic kidney disease, or end stage renal disease: Secondary | ICD-10-CM | POA: Diagnosis not present

## 2021-07-07 LAB — BASIC METABOLIC PANEL
Anion gap: 25 — ABNORMAL HIGH (ref 5–15)
BUN: 135 mg/dL — ABNORMAL HIGH (ref 8–23)
CO2: 18 mmol/L — ABNORMAL LOW (ref 22–32)
Calcium: 8.9 mg/dL (ref 8.9–10.3)
Chloride: 93 mmol/L — ABNORMAL LOW (ref 98–111)
Creatinine, Ser: 24.4 mg/dL — ABNORMAL HIGH (ref 0.61–1.24)
GFR, Estimated: 2 mL/min — ABNORMAL LOW (ref >60)
Glucose, Bld: 121 mg/dL — ABNORMAL HIGH (ref 70–99)
Potassium: 5.8 mmol/L — ABNORMAL HIGH (ref 3.5–5.1)
Sodium: 136 mmol/L (ref 135–145)

## 2021-07-07 LAB — CBC WITH DIFFERENTIAL/PLATELET
Abs Immature Granulocytes: 0.06 10*3/uL (ref 0.00–0.07)
Basophils Absolute: 0.1 10*3/uL (ref 0.0–0.1)
Basophils Relative: 1 %
Eosinophils Absolute: 0.5 10*3/uL (ref 0.0–0.5)
Eosinophils Relative: 5 %
HCT: 30.5 % — ABNORMAL LOW (ref 39.0–52.0)
Hemoglobin: 9.4 g/dL — ABNORMAL LOW (ref 13.0–17.0)
Immature Granulocytes: 1 %
Lymphocytes Relative: 19 %
Lymphs Abs: 2 10*3/uL (ref 0.7–4.0)
MCH: 29.3 pg (ref 26.0–34.0)
MCHC: 30.8 g/dL (ref 30.0–36.0)
MCV: 95 fL (ref 80.0–100.0)
Monocytes Absolute: 0.9 10*3/uL (ref 0.1–1.0)
Monocytes Relative: 9 %
Neutro Abs: 6.9 10*3/uL (ref 1.7–7.7)
Neutrophils Relative %: 65 %
Platelets: 219 10*3/uL (ref 150–400)
RBC: 3.21 MIL/uL — ABNORMAL LOW (ref 4.22–5.81)
RDW: 17.4 % — ABNORMAL HIGH (ref 11.5–15.5)
WBC: 10.4 10*3/uL (ref 4.0–10.5)
nRBC: 0 % (ref 0.0–0.2)

## 2021-07-07 LAB — TROPONIN I (HIGH SENSITIVITY)
Troponin I (High Sensitivity): 22 ng/L — ABNORMAL HIGH (ref <18)
Troponin I (High Sensitivity): 18 ng/L — ABNORMAL HIGH (ref <18)

## 2021-07-07 MED ORDER — SODIUM ZIRCONIUM CYCLOSILICATE 5 G PO PACK
5.0000 g | PACK | Freq: Once | ORAL | Status: AC
  Administered 2021-07-07: 5 g via ORAL
  Filled 2021-07-07: qty 1

## 2021-07-07 MED ORDER — IOHEXOL 350 MG/ML SOLN
66.0000 mL | Freq: Once | INTRAVENOUS | Status: AC | PRN
  Administered 2021-07-07: 66 mL via INTRAVENOUS

## 2021-07-07 NOTE — ED Notes (Signed)
This RN attempted to called  [facility] for update/care hand off. Facility did not answer. RN left VM. RN to try again. [facility] called to transport pt to dialysis center.

## 2021-07-07 NOTE — Discharge Instructions (Addendum)
You were seen today for chest pain.  Your work-up was reassuring.  You do have some evidence of mild volume overload and your potassium was slightly elevated.  This is likely related to not having dialysis since Saturday.  It is very important that you go to your dialysis session today.  Follow-up with cardiology.

## 2021-07-07 NOTE — ED Notes (Signed)
Patient transported to X-ray 
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
## 2021-07-07 NOTE — ED Notes (Signed)
[facility] notified that pt will be transported to Dialysis via [facility] this AM. RN verified with nurse appropriate dialysis center.

## 2021-07-07 NOTE — ED Notes (Signed)
Patient verbalizes understanding of discharge instructions. Opportunity for questioning and answers were provided. Armband removed by staff, pt discharged from ED via PTAR to Dialysis center.

## 2021-07-07 NOTE — ED Notes (Signed)
Pt transported to CT ?

## 2021-07-07 NOTE — ED Notes (Signed)
IV team at bedside 

## 2021-07-07 NOTE — ED Triage Notes (Signed)
Pt from [place] via EMS for R sided CP upon inspiration & expiration x6hrs. Been at [facility] since [facility] on 7/4. Per pt, has not been consistently going to dialysis due to transportation issues at facility. Afib on monitor, pt states no hx of same. 324 ASA at facility prior to EMS arrival

## 2021-07-15 ENCOUNTER — Other Ambulatory Visit: Payer: Self-pay

## 2021-07-15 ENCOUNTER — Ambulatory Visit (INDEPENDENT_AMBULATORY_CARE_PROVIDER_SITE_OTHER): Payer: Medicare Other | Admitting: Physician Assistant

## 2021-07-15 VITALS — BP 78/51 | Temp 98.0°F | Resp 20 | Ht 71.0 in | Wt 194.0 lb

## 2021-07-15 DIAGNOSIS — I739 Peripheral vascular disease, unspecified: Secondary | ICD-10-CM

## 2021-07-15 NOTE — Progress Notes (Signed)
    Postoperative Visit   History of Present Illness   [name] is a 70 y.o. year old male who presents for postoperative follow-up for left below the knee popliteal to PTA bypass with saphenous vein and transmetatarsal amputation of toes 2, 3, 4 of left foot by Dr. Oneida Alar on 06/18/2021.  He was discharged to [place] facility and returns office today.  He is seen today in a wheelchair and states he has not been able to walk.  He denies rest pain in his left foot.  He is unaware of any medications he is on however medicine list shows aspirin, Eliquis, and statin daily.  He is also an insulin-dependent diabetic.   For VQI Use Only   PRE-ADM LIVING: Nursing home  AMB STATUS: Wheelchair   Physical Examination   Vitals:   07/15/21 1142  BP: (!) 78/51  Resp: 20  Temp: 98 F (36.7 C)  TempSrc: Temporal  Weight: 194 lb 0.1 oz (88 kg)  Height: 5\' 11"  (1.803 m)    LLE: Palpable fluid collection proximalmost vein harvest incision of left leg with no drainage or obvious infection; left below the knee popliteal incision well-healed; copious amounts of flaking dead skin from the proximal shin down to the tip of his great toe; toe amp sites without purulence and posterior tibial incision healing well   Medical Decision Making   [name] is a 69 y.o. year old male who presents s/p left below the knee popliteal to PTA bypass with vein and amputations of toes 2, 3, 4.  Given the copious amounts of dead skin it was difficult to Doppler the left foot however I am able to obtain a PT and AT signal Surprisingly, given the obvious lack of hygiene of the left lower leg, the toe amp sites and posterior tibial artery cutdown incision seem to be healing well; nylon sutures removed Strongly encouraged nursing facility on paperwork to improve patient's hygiene by removing all dead skin from left leg, cleansing incisions in the rest of his body with soap and water daily, and  moisturizing the skin of his lower leg to increase the likelihood of healing He can continue therapy with his Darco shoe He is aware that if toe amputations and tibial cutdown incision do not heal he will require amputation He will return to office in about 3 to 4 weeks with a bypass duplex and ABIs   Dagoberto Ligas PA-C [facility]: [phone]  Clinic MD: Oneida Alar

## 2021-07-19 ENCOUNTER — Other Ambulatory Visit: Payer: Self-pay

## 2021-07-19 DIAGNOSIS — I739 Peripheral vascular disease, unspecified: Secondary | ICD-10-CM

## 2021-07-22 ENCOUNTER — Emergency Department (HOSPITAL_COMMUNITY): Payer: Medicare Other

## 2021-07-22 ENCOUNTER — Emergency Department (HOSPITAL_COMMUNITY)
Admission: EM | Admit: 2021-07-22 | Discharge: 2021-07-22 | Disposition: A | Discharge status: Home / Self Care | Payer: Medicare Other | Attending: Emergency Medicine | Admitting: Emergency Medicine

## 2021-07-22 DIAGNOSIS — I5032 Chronic diastolic (congestive) heart failure: Secondary | ICD-10-CM | POA: Diagnosis not present

## 2021-07-22 DIAGNOSIS — W19XXXA Unspecified fall, initial encounter: Secondary | ICD-10-CM

## 2021-07-22 DIAGNOSIS — Z794 Long term (current) use of insulin: Secondary | ICD-10-CM | POA: Insufficient documentation

## 2021-07-22 DIAGNOSIS — N189 Chronic kidney disease, unspecified: Secondary | ICD-10-CM | POA: Diagnosis not present

## 2021-07-22 DIAGNOSIS — Z79899 Other long term (current) drug therapy: Secondary | ICD-10-CM | POA: Diagnosis not present

## 2021-07-22 DIAGNOSIS — Z7982 Long term (current) use of aspirin: Secondary | ICD-10-CM | POA: Insufficient documentation

## 2021-07-22 DIAGNOSIS — M542 Cervicalgia: Secondary | ICD-10-CM | POA: Diagnosis not present

## 2021-07-22 DIAGNOSIS — E1122 Type 2 diabetes mellitus with diabetic chronic kidney disease: Secondary | ICD-10-CM | POA: Diagnosis not present

## 2021-07-22 DIAGNOSIS — S0990XA Unspecified injury of head, initial encounter: Secondary | ICD-10-CM | POA: Diagnosis present

## 2021-07-22 DIAGNOSIS — J45909 Unspecified asthma, uncomplicated: Secondary | ICD-10-CM | POA: Insufficient documentation

## 2021-07-22 DIAGNOSIS — I130 Hypertensive heart and chronic kidney disease with heart failure and stage 1 through stage 4 chronic kidney disease, or unspecified chronic kidney disease: Secondary | ICD-10-CM | POA: Diagnosis not present

## 2021-07-22 DIAGNOSIS — W01198A Fall on same level from slipping, tripping and stumbling with subsequent striking against other object, initial encounter: Secondary | ICD-10-CM | POA: Insufficient documentation

## 2021-07-22 DIAGNOSIS — Z7901 Long term (current) use of anticoagulants: Secondary | ICD-10-CM | POA: Diagnosis not present

## 2021-07-22 DIAGNOSIS — Z87891 Personal history of nicotine dependence: Secondary | ICD-10-CM | POA: Insufficient documentation

## 2021-07-22 MED ORDER — ACETAMINOPHEN 325 MG PO TABS
650.0000 mg | ORAL_TABLET | Freq: Once | ORAL | Status: AC
  Administered 2021-07-22: 650 mg via ORAL
  Filled 2021-07-22: qty 2

## 2021-07-22 NOTE — ED Triage Notes (Signed)
Brought in by [facility] from dialysis center - pt had mechanical fall and bumped his head. Pt arrives in c collar. Denies LOC and blood thinner use.

## 2021-07-22 NOTE — ED Provider Notes (Signed)
Emergency Medicine Provider Triage Evaluation Note  [name] , a 70 y.o. male  was evaluated in triage.  Pt complains of fall.  Was at his dialysis center when staffing moved a chair from under the patient. Resultant fall striking head. Complains of some head and neck pain. No LOC, extremity numbness or weakness. Not chronically anticoagulated.  Review of Systems  Positive: fall Negative: Numbness or weakness  Physical Exam  There were no vitals taken for this visit. Gen:   Awake, no distress   Resp:  Normal effort  MSK:   Moves extremities without difficulty  Other:  C-collar in place. Some TTP to thoracic midline between shoulder blades without step offs, crepitus, deformity. Equal grip strength b/l with preserved strength against resistance in b/l arms and legs. No battle's sign or raccoon's eyes.   Medical Decision Making  Medically screening exam initiated at 6:41 AM.  Appropriate orders placed.  [name] was informed that the remainder of the evaluation will be completed by another provider, this initial triage assessment does not replace that evaluation, and the importance of remaining in the ED until their evaluation is complete.  Fall, head injury   Antonietta Breach, Hershal Coria 07/22/21 0321    Quintella Reichert, MD 07/23/21 [phone]

## 2021-07-22 NOTE — ED Notes (Signed)
Correct address added to pts chart

## 2021-07-22 NOTE — ED Provider Notes (Signed)
[facility] EMERGENCY DEPARTMENT Provider Note  CSN: [id number] Arrival date & time: 07/22/21 0923    History Chief Complaint  Patient presents with   [name] is a 70 y.o. male reports he fell as he was transitioning from wheelchair to his dialysis chair at 6am this morning, falling forward and striking his head on the floor. He denies any LOC. Complaining of headache. He had neck pain in triage but none now.    Past Medical History:  Diagnosis Date   Arthritis    Asthma    as a child   Chronic cystitis    Chronic diastolic heart failure ([place])    Chronic kidney disease    HD pt, 3 times a week.   Diabetes mellitus    Dysrhythmia    GERD (gastroesophageal reflux disease)    H/O hiatal hernia    states it's been fixed   Hyperlipidemia    Hypertension    Lymphedema    Morbid obesity ([place])    NEPHROLITHIASIS, HX OF 12/02/2009   Qualifier: Diagnosis of  By: [name], Cat     PVD (peripheral vascular disease) ([place])    Renal insufficiency    UMBILICAL HERNIA 3/00/7622   Qualifier: History of  By: [name] MD, Shane     Venous insufficiency     Past Surgical History:  Procedure Laterality Date   ABDOMINAL AORTOGRAM W/LOWER EXTREMITY N/A 05/19/2017   Procedure: Abdominal Aortogram w/Lower Extremity;  Surgeon: [name], MD;  Location: [facility];  Service: Cardiovascular;  Laterality: N/A;   ABDOMINAL AORTOGRAM W/LOWER EXTREMITY N/A 06/18/2021   Procedure: ABDOMINAL AORTOGRAM W/LOWER EXTREMITY;  Surgeon: [name], MD;  Location: [facility];  Service: Cardiovascular;  Laterality: N/A;   AMPUTATION     Right and left fifth toes.    AMPUTATION Left 06/18/2021   Procedure: Amputation transmetatarsal of toes 2 3 and 4 left foot ;  Surgeon: [name], MD;  Location: [facility] OR;  Service: Vascular;  Laterality: Left;   AMPUTATION TOE     emoval of both little toes   AV FISTULA PLACEMENT Left 03/21/2014   Procedure: ARTERIOVENOUS (AV)  FISTULA CREATION with ultrasound;  Surgeon: [name], MD;  Location: [facility] OR;  Service: Vascular;  Laterality: Left;   COLONOSCOPY     EYE SURGERY Bilateral    cataract and lens implant   FEMORAL-TIBIAL BYPASS GRAFT Left 06/18/2021   Procedure: Left below-knee popliteal to posterior tibial artery bypass with 9 reversed left greater saphenous vein;  Surgeon: [name], MD;  Location: [facility] OR;  Service: Vascular;  Laterality: Left;   HERNIA REPAIR     LIGATION OF COMPETING BRANCHES OF ARTERIOVENOUS FISTULA Left 06/29/2015   Procedure: LIGATION OF LEFT ARM RADIOCEPHALIC ARTERIOVENOUS FISTULA SIDE BRANCHES;  Surgeon: Conrad [place], MD;  Location: [facility];  Service: Vascular;  Laterality: Left;   MULTIPLE EXTRACTIONS WITH ALVEOLOPLASTY N/A 04/07/2017   Procedure: Extraction of tooth #'s 1-11, 13, 14,16, 20-23, and 26-28 with alveoloplasty;  Surgeon: Lenn Cal, DDS;  Location: [facility];  Service: Oral Surgery;  Laterality: N/A;   Popliteal to posterior tibial bypass     2006   R knee arthoscopic repair of meniscus     UMBILICAL HERNIA REPAIR      Family History  Problem Relation Age of Onset   Diabetes Mother    Heart disease Mother    Diabetes Brother  Social History   Tobacco Use   Smoking status: Former    Types: Cigarettes    Quit date: 06/06/1979    Years since quitting: 42.1   Smokeless tobacco: Former  Scientific laboratory technician Use: Never used  Substance Use Topics   Alcohol use: No    Alcohol/week: 0.0 standard drinks    Comment:   "years ago" , none now   Drug use: No     Home Medications Prior to Admission medications   Medication Sig Start Date End Date Taking? Authorizing Provider  acetaminophen (TYLENOL) 325 MG tablet Take 2 tablets (650 mg total) by mouth every 6 (six) hours as needed for mild pain (or Fever >/= 101). 06/27/21   Nicole Kindred A, DO  amLODipine (NORVASC) 10 MG tablet Take 10 mg by mouth daily. 02/03/17   [provider]  apixaban  (ELIQUIS) 5 MG TABS tablet Take 1 tablet (5 mg total) by mouth 2 (two) times daily. 06/27/21   Ezekiel Slocumb, DO  aspirin EC 81 MG EC tablet Take 1 tablet (81 mg total) by mouth daily. Swallow whole. 06/28/21   Ezekiel Slocumb, DO  atorvastatin (LIPITOR) 80 MG tablet Take 1 tablet (80 mg total) by mouth daily. 06/28/21   Ezekiel Slocumb, DO  Darbepoetin Alfa (ARANESP) 100 MCG/0.5ML SOSY injection Inject 0.5 mLs (100 mcg total) into the vein every Thursday with hemodialysis. 07/01/21   Ezekiel Slocumb, DO  docusate sodium (COLACE) 100 MG capsule Take 1 capsule (100 mg total) by mouth daily. 06/28/21   Ezekiel Slocumb, DO  guaiFENesin-dextromethorphan (ROBITUSSIN DM) 100-10 MG/5ML syrup Take 15 mLs by mouth every 4 (four) hours as needed for cough. 06/27/21   Nicole Kindred A, DO  insulin glargine (LANTUS) 100 UNIT/ML injection Inject 0.04 mLs (4 Units total) into the skin 2 (two) times daily. 06/27/21   Nicole Kindred A, DO  metoprolol succinate (TOPROL-XL) 25 MG 24 hr tablet Take 1 tablet (25 mg total) by mouth daily. 06/28/21   Ezekiel Slocumb, DO  Nutritional Supplements (FEEDING SUPPLEMENT, NEPRO CARB STEADY,) LIQD Take 237 mLs by mouth 2 (two) times daily between meals. 06/27/21   Nicole Kindred A, DO  ondansetron (ZOFRAN) 4 MG tablet Take 1 tablet (4 mg total) by mouth every 6 (six) hours as needed for nausea. 06/27/21   Ezekiel Slocumb, DO  pantoprazole (PROTONIX) 40 MG tablet Take 1 tablet (40 mg total) by mouth daily. 06/28/21   Ezekiel Slocumb, DO  sucroferric oxyhydroxide (VELPHORO) 500 MG chewable tablet Chew 1 tablet (500 mg total) by mouth 3 (three) times daily with meals. 06/27/21   Ezekiel Slocumb, DO     Allergies    Other and Shrimp [shellfish allergy]   Review of Systems   Review of Systems A comprehensive review of systems was completed and negative except as noted in HPI.    Physical Exam BP (!) 120/56 (BP Location: Right Arm)   Pulse (!) 57   Temp 97.8 F  (36.6 C)   Resp 17   Ht 5\' 11"  (1.803 m)   Wt 88 kg   SpO2 94%   BMI 27.06 kg/m   Physical Exam Vitals and nursing note reviewed.  Constitutional:      Appearance: Normal appearance.  HENT:     Head: Normocephalic and atraumatic.     Nose: Nose normal.     Mouth/Throat:     Mouth: Mucous membranes are moist.  Eyes:  Extraocular Movements: Extraocular movements intact.     Conjunctiva/sclera: Conjunctivae normal.  Cardiovascular:     Rate and Rhythm: Normal rate.  Pulmonary:     Effort: Pulmonary effort is normal.     Breath sounds: Normal breath sounds.  Abdominal:     General: Abdomen is flat.     Palpations: Abdomen is soft.     Tenderness: There is no abdominal tenderness.  Musculoskeletal:        General: No swelling. Normal range of motion.     Cervical back: Neck supple. No tenderness (no midline spine tenderness).  Skin:    General: Skin is warm and dry.  Neurological:     General: No focal deficit present.     Mental Status: He is alert.  Psychiatric:        Mood and Affect: Mood normal.     ED Results / Procedures / Treatments   Labs (all labs ordered are listed, but only abnormal results are displayed) Labs Reviewed - No data to display  EKG None  Radiology DG Thoracic Spine 2 View  Result Date: 07/22/2021 CLINICAL DATA:  70 year old male status post fall this morning with pain. EXAM: THORACIC SPINE 2 VIEWS COMPARISON:  CTA chest 07/07/2021. FINDINGS: Evidence of bilateral C7 cervical ribs (normal variant). Normal thoracic segmentation. Widespread thoracic spine ankylosis related to flowing endplate osteophytes or syndesmophytes as demonstrated by CT last month. Stable vertebral height and alignment. Cervicothoracic junction alignment is within normal limits. No acute osseous abnormality identified. Posterior ribs appear grossly intact. With evidence of calcified pleural plaque at the left diaphragm and small right pleural effusion as seen by CT last  month. Negative visible bowel gas. IMPRESSION: 1. No acute osseous abnormality identified in the thoracic spine. Widespread thoracic spine ankylosis probably secondary to idiopathic skeletal hyperostosis (DISH). 2. Small right pleural effusion seen by CT last month likely persists. Calcified left pleural plaque at the diaphragm. Electronically Signed   By: Genevie Ann M.D.   On: 07/22/2021 07:26   CT HEAD WO CONTRAST (5MM)  Result Date: 07/22/2021 CLINICAL DATA:  Head trauma EXAM: CT HEAD WITHOUT CONTRAST TECHNIQUE: Contiguous axial images were obtained from the base of the skull through the vertex without intravenous contrast. COMPARISON:  CT head 06/14/2021 FINDINGS: Brain: There is no evidence of acute intracranial hemorrhage, extra-axial fluid collection, or infarct. A remote lacunar infarct in the left lentiform nucleus is unchanged. Scattered additional foci of hypodensity in the subcortical and periventricular white matter are nonspecific but likely reflects sequela of chronic white matter microangiopathy, unchanged. Mild global parenchymal volume loss is unchanged. The ventricles are stable in size. No mass lesion is identified. There is no midline shift. Vascular: There is calcification of the bilateral cavernous ICAs and vertebral arteries. Skull: There is no calvarial fracture. There is no suspicious osseous lesion. Sinuses/Orbits: The paranasal sinuses are clear. Other: Bilateral lens implants are noted. The globes and orbits are otherwise unremarkable. IMPRESSION: No acute intracranial hemorrhage or calvarial fracture. Electronically Signed   By: Valetta Mole MD   On: 07/22/2021 08:31   CT Cervical Spine Wo Contrast  Result Date: 07/22/2021 CLINICAL DATA:  70 year old male status post fall at dialysis. Pain. EXAM: CT CERVICAL SPINE WITHOUT CONTRAST TECHNIQUE: Multidetector CT imaging of the cervical spine was performed without intravenous contrast. Multiplanar CT image reconstructions were also  generated. COMPARISON:  Thoracic radiographs earlier today. CT cervical spine 06/14/2021. Head CT today is reported separately. FINDINGS: Alignment: Increase straightening of cervical lordosis from  last month. Cervicothoracic junction alignment is within normal limits. Bilateral posterior element alignment is within normal limits. Skull base and vertebrae: Visualized skull base is intact. No atlanto-occipital dissociation. Degenerative appearing sclerosis and heterogeneity of bone mineralization throughout the cervical spine. Multilevel endplate irregularity appears degenerative and stable from last month. C1 and C2 are intact and aligned. No acute osseous abnormality identified. Bilateral C7 cervical ribs, normal variant. Soft tissues and spinal canal: No prevertebral fluid or swelling. No visible canal hematoma. Confluent calcified carotid atherosclerosis in the neck. Disc levels: Widespread advanced cervical spine degeneration bulky disc osteophyte complex with at least mild spinal stenosis suspected at C3-C4, C5-C6, C6-C7. This appears stable from last month. Upper chest: Partially visible upper thoracic spine ankylosis, including ankylosis of the right posterior 2nd rib and right T2 transverse process. Visible upper thoracic levels appear stable and intact. Layering right pleural fluid now visible in the right lung apex. Left lung apex remains clear. Other: Visualized skull base is intact. No atlanto-occipital dissociation. Head CT reported separately. IMPRESSION: 1. No acute traumatic injury identified in the cervical spine. 2. Advanced cervical spine degeneration appears stable since last month with at least mild spinal stenosis suspected at C3-C4, C5-C6, and C6-C7. 3. New right pleural effusion visible in the apex. 4. Partially visible upper thoracic spine ankylosis. Electronically Signed   By: Genevie Ann M.D.   On: 07/22/2021 08:33    Procedures Procedures  Medications Ordered in the ED Medications   acetaminophen (TYLENOL) tablet 650 mg (has no administration in time range)     MDM Rules/Calculators/A&P MDM Imaging results reviewed, no acute injury. Has old degenerative disease in spine. Patient awake and alert, moving all extremities. Will d/c with head injury precautions. Patient states he will be OK to wait until Saturday to get his dialysis. He was advised to call his Nephrologist or [facility] if he begins to to feel SOB in the meantime. APAP as needed for headache.   ED Course  I have reviewed the triage vital signs and the nursing notes.  Pertinent labs & imaging results that were available during my care of the patient were reviewed by me and considered in my medical decision making (see chart for details).     Final Clinical Impression(s) / ED Diagnoses Final diagnoses:  Fall, initial encounter  Injury of head, initial encounter    Rx / DC Orders ED Discharge Orders     None        Truddie Hidden, MD 07/22/21 1427

## 2021-07-22 NOTE — ED Notes (Signed)
Pt usually transported home by [facility], [facility] notified/requested.

## 2021-07-22 NOTE — ED Notes (Signed)
Verbalizes here for fall while at HD this am, ~6am. [place] forward on to forehead. C/o forehead pain. 5/10. Denies other sx. c-collar remains. HD T,TH, S. AVG L FA/wrist. Described as mechanical fall, no dizziness, or LOC. Skin intact.
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
## 2021-07-28 ENCOUNTER — Encounter: Payer: Self-pay | Admitting: General Practice

## 2021-07-28 ENCOUNTER — Other Ambulatory Visit: Payer: Self-pay

## 2021-07-28 ENCOUNTER — Ambulatory Visit (INDEPENDENT_AMBULATORY_CARE_PROVIDER_SITE_OTHER): Payer: Medicare Other | Admitting: General Practice

## 2021-07-28 VITALS — BP 136/65 | HR 60

## 2021-07-28 DIAGNOSIS — I480 Paroxysmal atrial fibrillation: Secondary | ICD-10-CM

## 2021-07-28 DIAGNOSIS — I10 Essential (primary) hypertension: Secondary | ICD-10-CM

## 2021-07-28 DIAGNOSIS — R778 Other specified abnormalities of plasma proteins: Secondary | ICD-10-CM

## 2021-07-28 DIAGNOSIS — I5032 Chronic diastolic (congestive) heart failure: Secondary | ICD-10-CM | POA: Diagnosis not present

## 2021-07-28 DIAGNOSIS — I739 Peripheral vascular disease, unspecified: Secondary | ICD-10-CM

## 2021-07-28 NOTE — Patient Instructions (Signed)
Medication Instructions:  The current medical regimen is effective;  continue present plan and medications as directed. Please refer to the Current Medication list given to you today.   *If you need a refill on your cardiac medications before your next appointment, please call your pharmacy*  Lab Work:   Testing/Procedures:  NONE    NONE  Special Instructions PLEASE READ AND FOLLOW SALTY 6-ATTACHED-1,800mg  daily  PLEASE INCREASE PHYSICAL ACTIVITY AS TOLERATED,   Follow-Up: Your next appointment:  3-4 month(s) In Person with Minus Breeding, MD   At [facility], you and your health needs are our priority.  As part of our continuing mission to provide you with exceptional heart care, we have created designated Provider Care Teams.  These Care Teams include your primary Cardiologist (physician) and Advanced Practice Providers (APPs -  Physician Assistants and Nurse Practitioners) who all work together to provide you with the care you need, when you need it.            6 SALTY THINGS TO AVOID     1,800MG  DAILY

## 2021-08-05 ENCOUNTER — Other Ambulatory Visit: Payer: Self-pay

## 2021-08-05 ENCOUNTER — Ambulatory Visit (HOSPITAL_COMMUNITY)
Admission: RE | Admit: 2021-08-05 | Discharge: 2021-08-05 | Disposition: A | Discharge status: Home / Self Care | Payer: Medicare Other | Source: Ambulatory Visit | Attending: Vascular Surgery | Admitting: Vascular Surgery

## 2021-08-05 ENCOUNTER — Ambulatory Visit: Payer: Medicare Other

## 2021-08-05 DIAGNOSIS — I739 Peripheral vascular disease, unspecified: Secondary | ICD-10-CM

## 2021-08-12 ENCOUNTER — Ambulatory Visit (INDEPENDENT_AMBULATORY_CARE_PROVIDER_SITE_OTHER): Payer: Medicare Other | Admitting: Physician Assistant

## 2021-08-12 ENCOUNTER — Encounter: Payer: Self-pay | Admitting: Physician Assistant

## 2021-08-12 ENCOUNTER — Other Ambulatory Visit: Payer: Self-pay

## 2021-08-12 ENCOUNTER — Ambulatory Visit (INDEPENDENT_AMBULATORY_CARE_PROVIDER_SITE_OTHER)
Admission: RE | Admit: 2021-08-12 | Discharge: 2021-08-12 | Disposition: A | Discharge status: Home / Self Care | Payer: Medicare Other | Source: Ambulatory Visit | Attending: Physician Assistant | Admitting: Physician Assistant

## 2021-08-12 ENCOUNTER — Ambulatory Visit (HOSPITAL_COMMUNITY)
Admission: RE | Admit: 2021-08-12 | Discharge: 2021-08-12 | Disposition: A | Discharge status: Home / Self Care | Payer: Medicare Other | Source: Ambulatory Visit | Attending: Physician Assistant | Admitting: Physician Assistant

## 2021-08-12 VITALS — BP 148/70 | HR 66 | Temp 98.3°F | Resp 20 | Ht 71.0 in | Wt 194.0 lb

## 2021-08-12 DIAGNOSIS — I83019 Varicose veins of right lower extremity with ulcer of unspecified site: Secondary | ICD-10-CM

## 2021-08-12 DIAGNOSIS — I83029 Varicose veins of left lower extremity with ulcer of unspecified site: Secondary | ICD-10-CM

## 2021-08-12 DIAGNOSIS — I739 Peripheral vascular disease, unspecified: Secondary | ICD-10-CM | POA: Diagnosis present

## 2021-08-12 DIAGNOSIS — L97929 Non-pressure chronic ulcer of unspecified part of left lower leg with unspecified severity: Secondary | ICD-10-CM

## 2021-08-12 DIAGNOSIS — L97919 Non-pressure chronic ulcer of unspecified part of right lower leg with unspecified severity: Secondary | ICD-10-CM

## 2021-08-12 NOTE — Progress Notes (Signed)
POST OPERATIVE OFFICE NOTE    CC:  F/u for surgery  HPI:  [name] is a 69 y.o. year old male who presents for postoperative follow-up for left below the knee popliteal to PTA bypass with saphenous vein and transmetatarsal amputation of toes 2, 3, 4 of left foot by Dr. Oneida Alar on 06/18/2021.  He was discharged to [place] facility and returns office today.    Pt returns today for follow up with ABI and left LE arterial duplex.  Allergies  Allergen Reactions   Other Swelling and Itching   Shrimp [Shellfish Allergy] Swelling    Current Outpatient Medications  Medication Sig Dispense Refill   acetaminophen (TYLENOL) 325 MG tablet Take 2 tablets (650 mg total) by mouth every 6 (six) hours as needed for mild pain (or Fever >/= 101).     amLODipine (NORVASC) 10 MG tablet Take 10 mg by mouth daily.     apixaban (ELIQUIS) 5 MG TABS tablet Take 1 tablet (5 mg total) by mouth 2 (two) times daily. 60 tablet    aspirin EC 81 MG EC tablet Take 1 tablet (81 mg total) by mouth daily. Swallow whole. 30 tablet 11   atorvastatin (LIPITOR) 80 MG tablet Take 1 tablet (80 mg total) by mouth daily.     Darbepoetin Alfa (ARANESP) 100 MCG/0.5ML SOSY injection Inject 0.5 mLs (100 mcg total) into the vein every Thursday with hemodialysis. 4.2 mL    docusate sodium (COLACE) 100 MG capsule Take 1 capsule (100 mg total) by mouth daily. 10 capsule 0   guaiFENesin-dextromethorphan (ROBITUSSIN DM) 100-10 MG/5ML syrup Take 15 mLs by mouth every 4 (four) hours as needed for cough. 118 mL 0   insulin glargine (LANTUS) 100 UNIT/ML injection Inject 0.04 mLs (4 Units total) into the skin 2 (two) times daily. 10 mL 11   metoprolol succinate (TOPROL-XL) 25 MG 24 hr tablet Take 1 tablet (25 mg total) by mouth daily.     Nutritional Supplements (FEEDING SUPPLEMENT, NEPRO CARB STEADY,) LIQD Take 237 mLs by mouth 2 (two) times daily between meals.  0   ondansetron (ZOFRAN) 4 MG tablet Take 1 tablet (4 mg total)  by mouth every 6 (six) hours as needed for nausea. 20 tablet 0   pantoprazole (PROTONIX) 40 MG tablet Take 1 tablet (40 mg total) by mouth daily.     sucroferric oxyhydroxide (VELPHORO) 500 MG chewable tablet Chew 1 tablet (500 mg total) by mouth 3 (three) times daily with meals. 90 tablet    No current facility-administered medications for this visit.     ROS:  See HPI  Physical Exam:       Incision:  left LE toe amputations have healed very well.  Leg incisions completely healed. Extremities:  Thick edematous alligator skin B LE.    Left Graft #1: BK pop to PTA  +--------------------+--------+--------+--------+--------------+                      PSV cm/sStenosisWaveformComments        +--------------------+--------+--------+--------+--------------+  Inflow              172             biphasic                +--------------------+--------+--------+--------+--------------+  Proximal Anastomosis154             biphasic                +--------------------+--------+--------+--------+--------------+  Proximal Graft                              not visualized  +--------------------+--------+--------+--------+--------------+  Mid Graft                                   not visualized  +--------------------+--------+--------+--------+--------------+  Distal Graft        77              biphasic                +--------------------+--------+--------+--------+--------------+  Distal Anastomosis                                          +--------------------+--------+--------+--------+--------------+  Outflow             61              biphasic                +--------------------+--------+--------+--------+--------------+       Summary:  Left: Not all areas visualized, but appears to be patent Popliteal to PTA  bypass.       ABI Findings:  +---------+------------------+-----+----------+--------+  Right    Rt Pressure  (mmHg)IndexWaveform  Comment   +---------+------------------+-----+----------+--------+  Brachial 173                                        +---------+------------------+-----+----------+--------+  PTA      160               0.92 monophasic          +---------+------------------+-----+----------+--------+  DP       111               0.64 biphasic            +---------+------------------+-----+----------+--------+  Great Toe47                0.27 Abnormal            +---------+------------------+-----+----------+--------+   +---------+------------------+-----+---------+-------+  Left     Lt Pressure (mmHg)IndexWaveform Comment  +---------+------------------+-----+---------+-------+  PTA      255               1.47 biphasic          +---------+------------------+-----+---------+-------+  DP       255               1.47 triphasic         +---------+------------------+-----+---------+-------+  Great Toe73                0.42 Abnormal          +---------+------------------+-----+---------+-------+   +-------+-----------+-----------+------------+------------+  ABI/TBIToday's ABIToday's TBIPrevious ABIPrevious TBI  +-------+-----------+-----------+------------+------------+  Right  0.92       0.27       0.72        0.32          +-------+-----------+-----------+------------+------------+  Left   [name]         0.42       0.72        0.25          +-------+-----------+-----------+------------+------------+  Summary:  Right: Resting right ankle-brachial index indicates mild right lower  extremity arterial disease. The right toe-brachial index is abnormal.   Left: Resting left ankle-brachial index indicates noncompressible left  lower extremity arteries. The left toe-brachial index is abnormal.    Assessment/Plan:  This is a 70 y.o. male who is s/p:left below the knee popliteal to PTA bypass with saphenous vein and  transmetatarsal amputation of toes 2, 3, 4 of left foot by Dr. Oneida Alar on 06/18/2021.  History of right LE bypass.  His studies show maximum improvement with his arterial flow.    He states he doesn't ambulate and he does n't elevate his feet very often.  He has a history of venous disease and has had una boot care with wound care by Dr. Sharol Given in the past.  He has a new open area on the right lateral foot.  I will refer him back to Dr. Sharol Given for right foot exam and treatment.    Roxy Horseman PA-C [facility] [phone]   Clinic MD:  Scot Dock

## 2021-08-13 ENCOUNTER — Ambulatory Visit: Payer: Medicare Other

## 2021-10-25 ENCOUNTER — Ambulatory Visit (INDEPENDENT_AMBULATORY_CARE_PROVIDER_SITE_OTHER): Payer: Medicare Other | Admitting: Orthopedic Surgery

## 2021-10-25 DIAGNOSIS — I739 Peripheral vascular disease, unspecified: Secondary | ICD-10-CM

## 2021-10-25 DIAGNOSIS — L97411 Non-pressure chronic ulcer of right heel and midfoot limited to breakdown of skin: Secondary | ICD-10-CM

## 2021-10-25 DIAGNOSIS — L97919 Non-pressure chronic ulcer of unspecified part of right lower leg with unspecified severity: Secondary | ICD-10-CM | POA: Diagnosis not present

## 2021-10-25 DIAGNOSIS — L97929 Non-pressure chronic ulcer of unspecified part of left lower leg with unspecified severity: Secondary | ICD-10-CM

## 2021-10-25 DIAGNOSIS — I87333 Chronic venous hypertension (idiopathic) with ulcer and inflammation of bilateral lower extremity: Secondary | ICD-10-CM

## 2021-10-26 ENCOUNTER — Telehealth: Payer: Self-pay

## 2021-10-26 NOTE — Telephone Encounter (Signed)
He came in yesterday for a non-pressure chronic ulcer of right heel and midfoot. Was placed in a podous boot. Please advise.

## 2021-10-26 NOTE — Telephone Encounter (Signed)
The nurse treating [name] called and would like to know how long he is in the boot for and if he can take it off ?   Please advise - cb # [phone] Caryl Pina

## 2021-10-27 NOTE — Telephone Encounter (Signed)
[name], lm on work VM of instructions per Dr. Sharol Given below.

## 2021-10-28 DIAGNOSIS — I4819 Other persistent atrial fibrillation: Secondary | ICD-10-CM | POA: Insufficient documentation

## 2021-10-28 DIAGNOSIS — R778 Other specified abnormalities of plasma proteins: Secondary | ICD-10-CM | POA: Insufficient documentation
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
## 2021-10-29 ENCOUNTER — Ambulatory Visit: Payer: Medicare Other | Admitting: Cardiology

## 2021-11-01 ENCOUNTER — Encounter: Payer: Self-pay | Admitting: Orthopedic Surgery

## 2021-11-01 ENCOUNTER — Ambulatory Visit (INDEPENDENT_AMBULATORY_CARE_PROVIDER_SITE_OTHER): Payer: Medicare Other | Admitting: Orthopedic Surgery

## 2021-11-01 ENCOUNTER — Other Ambulatory Visit: Payer: Self-pay

## 2021-11-01 DIAGNOSIS — L97919 Non-pressure chronic ulcer of unspecified part of right lower leg with unspecified severity: Secondary | ICD-10-CM | POA: Diagnosis not present

## 2021-11-01 DIAGNOSIS — L97411 Non-pressure chronic ulcer of right heel and midfoot limited to breakdown of skin: Secondary | ICD-10-CM | POA: Diagnosis not present

## 2021-11-01 DIAGNOSIS — I87333 Chronic venous hypertension (idiopathic) with ulcer and inflammation of bilateral lower extremity: Secondary | ICD-10-CM

## 2021-11-01 DIAGNOSIS — I739 Peripheral vascular disease, unspecified: Secondary | ICD-10-CM | POA: Diagnosis not present

## 2021-11-01 NOTE — Progress Notes (Signed)
Office Visit Note   Patient: [name]           Date of Birth: [birth year]           MRN: [id number] Visit Date: 11/01/2021              Requested by: [name], [name] [place],  [place] [id number] PCP: [name], [name]  Chief Complaint  Patient presents with   Right Foot - Wound Check      HPI: Patient is a 70 year old gentleman who is seen in follow-up for venous insufficiency and arterial insufficiency to the right lower extremity with a right heel decubitus ulcer and lateral foot ulcer.  Patient has been wearing a PRAFO and was placed in a compression wrap at the last visit.  Assessment & Plan: Visit Diagnoses:  1. Non-pressure chronic ulcer of right heel and midfoot limited to breakdown of skin ([place])   2. Chronic venous hypertension (idiopathic) with ulcer and inflammation of bilateral lower extremity (HCC)   3. PVD (peripheral vascular disease) ([place])     Plan: The ulcers are showing improvement with the compression.  We will reapply silver alginate to the heel ulcer and lateral base of the fifth metatarsal ulcer continue with a 3 layer compression wrap continue with the PRAFO.  Patient has a appointment with vascular vein surgery on December 23.  Follow-Up Instructions: Return in about 1 week (around 11/08/2021).   Ortho Exam  Patient is alert, oriented, no adenopathy, well-dressed, normal affect, normal respiratory effort. Examination patient showed significant improvement with decreased venous edema and wrinkling of the skin.  The base of the fifth metatarsal ulcer has 90% granulation healthy tissue with no exposed bone or tendon.  The fibrinous exudate was debrided from the heel ulcer and this also shows an improvement.  There is no exposed bone.  Imaging: No results found. No images are attached to the encounter.  Labs: Lab Results  Component Value Date   HGBA1C 4.9 06/15/2021   HGBA1C 6.2 (H) 07/11/2018   HGBA1C 6.8 03/13/2017    ESRSEDRATE 105 (H) 06/20/2021   ESRSEDRATE 112 (H) 06/17/2021   ESRSEDRATE 95 (H) 06/16/2021   CRP 10.9 (H) 06/20/2021   CRP 20.5 (H) 06/17/2021   CRP 26.3 (H) 06/16/2021   LABURIC 9.7 (H) 02/23/2015   REPTSTATUS 06/22/2021 FINAL 06/17/2021   GRAMSTAIN  06/24/2011    RARE WBC PRESENT,BOTH PMN AND MONONUCLEAR NO ORGANISMS SEEN   GRAMSTAIN  06/24/2011    RARE WBC PRESENT,BOTH PMN AND MONONUCLEAR NO ORGANISMS SEEN   CULT  06/17/2021    NO GROWTH 5 DAYS Performed at [facility], [place] [address] [place], [place] [address]    [place] MORGANII 08/21/2015     Lab Results  Component Value Date   ALBUMIN 2.3 (L) 06/24/2021   ALBUMIN 2.7 (L) 06/14/2021   ALBUMIN 2.7 (L) 09/12/2018    Lab Results  Component Value Date   MG 1.9 06/15/2021   MG 2.0 04/13/2017   MG 1.9 03/22/2017   No results found for: VD25OH  No results found for: PREALBUMIN CBC EXTENDED Latest Ref Rng & Units 07/07/2021 06/27/2021 06/26/2021  WBC 4.0 - 10.5 K/uL 10.4 14.3(H) 13.8(H)  RBC 4.22 - 5.81 MIL/uL 3.21(L) 2.48(L) 2.38(L)  HGB 13.0 - 17.0 g/dL 9.4(L) 7.4(L) 7.2(L)  HCT 39.0 - 52.0 % 30.5(L) 23.5(L) 22.4(L)  PLT 150 - 400 K/uL 219 283 273  NEUTROABS 1.7 - 7.7 K/uL  6.9 - -  LYMPHSABS 0.7 - 4.0 K/uL 2.0 - -     There is no height or weight on file to calculate BMI.  Orders:  No orders of the defined types were placed in this encounter.  No orders of the defined types were placed in this encounter.    Procedures: No procedures performed  Clinical Data: No additional findings.  ROS:  All other systems negative, except as noted in the HPI. Review of Systems  Objective: Vital Signs: There were no vitals taken for this visit.  Specialty Comments:  No specialty comments available.  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, left leg ([place]) 06/28/2021   Sepsis due to cellulitis ([place]) 06/14/2021   Cellulitis of foot, left    Hypercalcemia 06/02/2020   Other disorders of phosphorus metabolism 01/04/2019   Venous stasis    History of endocarditis    Bradycardia    Orthostasis    Chest pain 09/10/2018   Hypovolemia associated with hemodialysis 07/11/2018   Weakness    Thrombocytopenia (HCC) 07/10/2018   Moderate protein-calorie malnutrition (HCC) 05/12/2017   Balanitis 04/25/2017   Penile swelling    Scrotal swelling    UTI (urinary tract infection) 04/13/2017   Anemia of chronic disease    Acute encephalopathy 04/12/2017   Urinary retention 04/10/2017   Gram-negative sepsis, unspecified ([place]) 03/29/2017   Bacteremia    RUQ abdominal pain    Gross hematuria    Other pancytopenia ([place])    Sepsis ([place]) 03/21/2017   Diarrhea 03/03/2017   Hyperkalemia 01/24/2017   Retained lens material following cataract surgery of both eyes 11/08/2016   Venous stasis ulcers of both lower extremities ([place]) 08/07/2015   Varicose veins of lower extremities with complications 12/45/8099   Malfunction of arteriovenous dialysis fistula (HCC)    CHF (congestive heart failure) ([place]) 06/23/2015   Headache, unspecified 06/23/2015   Iron deficiency anemia, unspecified 06/23/2015   Other specified coagulation defects ([place]) 06/23/2015   Pain, unspecified 06/23/2015   Pruritus, unspecified 06/23/2015   Secondary hyperparathyroidism of renal origin ([place]) 06/23/2015   Type 2 diabetes mellitus with diabetic peripheral angiopathy without gangrene ([place]) 06/23/2015   Lymphedema distichiasis syndrome with kidney disease and diabetes mellitus ([place]) 04/06/2015   Lymphedema of lower extremity 04/04/2015   Type II diabetes mellitus with nephropathy ([place]) 04/04/2015   MGUS (monoclonal gammopathy of unknown significance) 06/12/2014   Chronic diastolic heart failure ([place]) 10/30/2013   Special  screening for malignant neoplasms, colon 07/09/2013   Proliferative diabetic retinopathy ([place]) 10/23/2012   Knee pain 08/21/2012   Mass of thigh 08/21/2012   FEVER UNSPECIFIED 05/06/2010   ORGANIC IMPOTENCE 12/02/2009   ONYCHOMYCOSIS 07/17/2009   Lymphedema 05/06/2009   ESRD on dialysis ([place]) 06/09/2008   Hyperlipidemia 03/19/2007   Class 2 severe obesity due to excess calories with serious comorbidity and body mass index (BMI) of 35.0 to 35.9 in adult [facility]) 02/08/2007   Essential hypertension 02/08/2007   Atherosclerosis of native arteries of extremity with intermittent claudication ([place]) 02/08/2007   Past Medical History:  Diagnosis Date   Arthritis    Asthma    as a child   Chronic cystitis    Chronic diastolic heart failure ([place])    Chronic kidney disease    HD pt, 3 times a week.   Diabetes mellitus  Dysrhythmia    GERD (gastroesophageal reflux disease)    H/O hiatal hernia    states it's been fixed   Hyperlipidemia    Hypertension    Lymphedema    Morbid obesity ([place])    NEPHROLITHIASIS, HX OF 12/02/2009   Qualifier: Diagnosis of  By: [name], Cat     PVD (peripheral vascular disease) ([place])    Renal insufficiency    UMBILICAL HERNIA 07/21/1750   Qualifier: History of  By: [name] MD, Shane     Venous insufficiency     Family History  Problem Relation Age of Onset   Diabetes Mother    Heart disease Mother    Diabetes Brother     Past Surgical History:  Procedure Laterality Date   ABDOMINAL AORTOGRAM W/LOWER EXTREMITY N/A 05/19/2017   Procedure: Abdominal Aortogram w/Lower Extremity;  Surgeon: [name], MD;  Location: [facility];  Service: Cardiovascular;  Laterality: N/A;   ABDOMINAL AORTOGRAM W/LOWER EXTREMITY N/A 06/18/2021   Procedure: ABDOMINAL AORTOGRAM W/LOWER EXTREMITY;  Surgeon: [name], MD;  Location: [facility];  Service: Cardiovascular;  Laterality: N/A;   AMPUTATION     Right and left fifth toes.    AMPUTATION Left  06/18/2021   Procedure: Amputation transmetatarsal of toes 2 3 and 4 left foot ;  Surgeon: [name], MD;  Location: [facility] OR;  Service: Vascular;  Laterality: Left;   AMPUTATION TOE     emoval of both little toes   AV FISTULA PLACEMENT Left 03/21/2014   Procedure: ARTERIOVENOUS (AV) FISTULA CREATION with ultrasound;  Surgeon: [name], MD;  Location: [facility] OR;  Service: Vascular;  Laterality: Left;   COLONOSCOPY     EYE SURGERY Bilateral    cataract and lens implant   FEMORAL-TIBIAL BYPASS GRAFT Left 06/18/2021   Procedure: Left below-knee popliteal to posterior tibial artery bypass with 9 reversed left greater saphenous vein;  Surgeon: [name], MD;  Location: [facility] OR;  Service: Vascular;  Laterality: Left;   HERNIA REPAIR     LIGATION OF COMPETING BRANCHES OF ARTERIOVENOUS FISTULA Left 06/29/2015   Procedure: LIGATION OF LEFT ARM RADIOCEPHALIC ARTERIOVENOUS FISTULA SIDE BRANCHES;  Surgeon: Conrad Terrell, MD;  Location: [facility];  Service: Vascular;  Laterality: Left;   MULTIPLE EXTRACTIONS WITH ALVEOLOPLASTY N/A 04/07/2017   Procedure: Extraction of tooth #'s 1-11, 13, 14,16, 20-23, and 26-28 with alveoloplasty;  Surgeon: Lenn Cal, DDS;  Location: [facility];  Service: Oral Surgery;  Laterality: N/A;   Popliteal to posterior tibial bypass     2006   R knee arthoscopic repair of meniscus     UMBILICAL HERNIA REPAIR     Social History   Occupational History    Employer: DISABLED   Tobacco Use   Smoking status: Former    Types: Cigarettes    Quit date: 06/06/1979    Years since quitting: 42.4   Smokeless tobacco: Former  Scientific laboratory technician Use: Never used  Substance and Sexual Activity   Alcohol use: No    Alcohol/week: 0.0 standard drinks    Comment:   "years ago" , none now   Drug use: No   Sexual activity: Never

## 2021-11-01 NOTE — Progress Notes (Signed)
Office Visit Note   Patient: [name]           Date of Birth: Aug 30, 1951           MRN: [id number] Visit Date: 10/25/2021              Requested by: Patrick Side., [name] [place],  [place] [address] PCP: Patrick Side., [name]  Chief Complaint  Patient presents with   Left Leg - Follow-up   Right Leg - Follow-up      HPI: Patient is a 70 year old gentleman who presents with new ulceration lateral aspect of the right foot base of the fifth metatarsal and right heel.  Patient is status post amputation of the second third and fourth toes on the left foot and status post revascularization to the left lower extremity in July of this year.  Patient is currently resident at [facility] and has a Kerlix wrap on the right leg.  Assessment & Plan: Visit Diagnoses:  1. Non-pressure chronic ulcer of right heel and midfoot limited to breakdown of skin ([place])   2. Chronic venous hypertension (idiopathic) with ulcer and inflammation of bilateral lower extremity (HCC)   3. PVD (peripheral vascular disease) ([place])     Plan: We will obtain a urgent consult with vascular vein surgery to evaluate for revascularization to the right lower extremity.  With the massive venous swelling will start compression wraps to try to improve the circulation by decreasing the swelling.  We will also give him a PRAFO to unload pressure from the heel ulcer.  Follow-Up Instructions: Return in about 1 week (around 11/01/2021).   Ortho Exam  Patient is alert, oriented, no adenopathy, well-dressed, normal affect, normal respiratory effort. Examination patient does not have palpable pulses the Doppler was used and he has a dampened monophasic dorsalis pedis and posterior tibial pulse on the right.  There is massive swelling in the right lower extremity he has a decubitus ulcer over the right heel that is 2 cm in diameter and an ulcer at the base of the fifth metatarsal which is also 2 cm in diameter  both fibrinous exudative tissue.  Imaging: No results found. No images are attached to the encounter.  Labs: Lab Results  Component Value Date   HGBA1C 4.9 06/15/2021   HGBA1C 6.2 (H) 07/11/2018   HGBA1C 6.8 03/13/2017   ESRSEDRATE 105 (H) 06/20/2021   ESRSEDRATE 112 (H) 06/17/2021   ESRSEDRATE 95 (H) 06/16/2021   CRP 10.9 (H) 06/20/2021   CRP 20.5 (H) 06/17/2021   CRP 26.3 (H) 06/16/2021   LABURIC 9.7 (H) 02/23/2015   REPTSTATUS 06/22/2021 FINAL 06/17/2021   GRAMSTAIN  06/24/2011    RARE WBC PRESENT,BOTH PMN AND MONONUCLEAR NO ORGANISMS SEEN   GRAMSTAIN  06/24/2011    RARE WBC PRESENT,BOTH PMN AND MONONUCLEAR NO ORGANISMS SEEN   CULT  06/17/2021    NO GROWTH 5 DAYS Performed at [facility], [place] [address] [place], [place] [address]    Elmer MORGANII 08/21/2015     Lab Results  Component Value Date   ALBUMIN 2.3 (L) 06/24/2021   ALBUMIN 2.7 (L) 06/14/2021   ALBUMIN 2.7 (L) 09/12/2018    Lab Results  Component Value Date   MG 1.9 06/15/2021   MG 2.0 04/13/2017   MG 1.9 03/22/2017   No results found for: VD25OH  No results found for: PREALBUMIN CBC EXTENDED Latest Ref Rng & Units 07/07/2021 06/27/2021 06/26/2021  WBC 4.0 - 10.5 K/uL 10.4 14.3(H) 13.8(H)  RBC 4.22 - 5.81 MIL/uL 3.21(L) 2.48(L) 2.38(L)  HGB 13.0 - 17.0 g/dL 9.4(L) 7.4(L) 7.2(L)  HCT 39.0 - 52.0 % 30.5(L) 23.5(L) 22.4(L)  PLT 150 - 400 K/uL 219 283 273  NEUTROABS 1.7 - 7.7 K/uL 6.9 - -  LYMPHSABS 0.7 - 4.0 K/uL 2.0 - -     There is no height or weight on file to calculate BMI.  Orders:  Orders Placed This Encounter  Procedures   Ambulatory referral to Vascular Surgery   No orders of the defined types were placed in this encounter.    Procedures: No procedures performed  Clinical Data: No additional findings.  ROS:  All other systems negative, except as noted in the HPI. Review of Systems  Objective: Vital Signs: There were no vitals taken for this  visit.  Specialty Comments:  No specialty comments available.  PMFS History: Patient Active Problem List   Diagnosis Date Noted   Persistent atrial fibrillation ([place]) 10/28/2021   Elevated troponin 10/28/2021   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, left leg ([place]) 06/28/2021   Sepsis due to cellulitis ([place]) 06/14/2021   Cellulitis of foot, left    Hypercalcemia 06/02/2020   Other disorders of phosphorus metabolism 01/04/2019   Venous stasis    History of endocarditis    Bradycardia    Orthostasis    Chest pain 09/10/2018   Hypovolemia associated with hemodialysis 07/11/2018   Weakness    Thrombocytopenia (HCC) 07/10/2018   Moderate protein-calorie malnutrition (HCC) 05/12/2017   Balanitis 04/25/2017   Penile swelling    Scrotal swelling    UTI (urinary tract infection) 04/13/2017   Anemia of chronic disease    Acute encephalopathy 04/12/2017   Urinary retention 04/10/2017   Gram-negative sepsis, unspecified ([place]) 03/29/2017   Bacteremia    RUQ abdominal pain    Gross hematuria    Other pancytopenia ([place])    Sepsis ([place]) 03/21/2017   Diarrhea 03/03/2017   Hyperkalemia 01/24/2017   Retained lens material following cataract surgery of both eyes 11/08/2016   Venous stasis ulcers of both lower extremities ([place]) 08/07/2015   Varicose veins of lower extremities with complications 11/57/2620   Malfunction of arteriovenous dialysis fistula (HCC)    CHF (congestive heart failure) ([place]) 06/23/2015   Headache, unspecified 06/23/2015   Iron deficiency anemia, unspecified 06/23/2015   Other specified coagulation defects ([place]) 06/23/2015   Pain, unspecified 06/23/2015   Pruritus, unspecified 06/23/2015   Secondary hyperparathyroidism of renal origin ([place]) 06/23/2015   Type 2 diabetes mellitus with diabetic peripheral angiopathy without gangrene ([place]) 06/23/2015   Lymphedema distichiasis syndrome with kidney disease and  diabetes mellitus ([place]) 04/06/2015   Lymphedema of lower extremity 04/04/2015   Type II diabetes mellitus with nephropathy ([place]) 04/04/2015   MGUS (monoclonal gammopathy of unknown significance) 06/12/2014   Chronic diastolic heart failure ([place]) 10/30/2013   Special screening for malignant neoplasms, colon 07/09/2013   Proliferative diabetic retinopathy ([place]) 10/23/2012   Knee pain 08/21/2012   Mass of thigh 08/21/2012   FEVER UNSPECIFIED 05/06/2010   ORGANIC IMPOTENCE 12/02/2009   ONYCHOMYCOSIS 07/17/2009   Lymphedema 05/06/2009   ESRD on dialysis ([place]) 06/09/2008   Hyperlipidemia 03/19/2007   Class 2 severe obesity due to excess calories with serious comorbidity and body mass index (BMI) of 35.0 to 35.9 in adult [facility]) 02/08/2007   Essential hypertension 02/08/2007   Atherosclerosis of native arteries of extremity with intermittent claudication ([place])  02/08/2007   Past Medical History:  Diagnosis Date   Arthritis    Asthma    as a child   Chronic cystitis    Chronic diastolic heart failure ([place])    Chronic kidney disease    HD pt, 3 times a week.   Diabetes mellitus    Dysrhythmia    GERD (gastroesophageal reflux disease)    H/O hiatal hernia    states it's been fixed   Hyperlipidemia    Hypertension    Lymphedema    Morbid obesity ([place])    NEPHROLITHIASIS, HX OF 12/02/2009   Qualifier: Diagnosis of  By: [name], Cat     PVD (peripheral vascular disease) ([place])    Renal insufficiency    UMBILICAL HERNIA 05/27/736   Qualifier: History of  By: [name] MD, Shane     Venous insufficiency     Family History  Problem Relation Age of Onset   Diabetes Mother    Heart disease Mother    Diabetes Brother     Past Surgical History:  Procedure Laterality Date   ABDOMINAL AORTOGRAM W/LOWER EXTREMITY N/A 05/19/2017   Procedure: Abdominal Aortogram w/Lower Extremity;  Surgeon: [name], MD;  Location: [name] [facility];  Service: Cardiovascular;  Laterality: N/A;    ABDOMINAL AORTOGRAM W/LOWER EXTREMITY N/A 06/18/2021   Procedure: ABDOMINAL AORTOGRAM W/LOWER EXTREMITY;  Surgeon: [name], MD;  Location: [place] [facility];  Service: Cardiovascular;  Laterality: N/A;   AMPUTATION     Right and left fifth toes.    AMPUTATION Left 06/18/2021   Procedure: Amputation transmetatarsal of toes 2 3 and 4 left foot ;  Surgeon: [name], MD;  Location: [facility] OR;  Service: Vascular;  Laterality: Left;   AMPUTATION TOE     emoval of both little toes   AV FISTULA PLACEMENT Left 03/21/2014   Procedure: ARTERIOVENOUS (AV) FISTULA CREATION with ultrasound;  Surgeon: [name], MD;  Location[facility] OR;  Service: Vascular;  Laterality: Left;   COLONOSCOPY     EYE SURGERY Bilateral    cataract and lens implant   FEMORAL-TIBIAL BYPASS GRAFT Left 06/18/2021   Procedure: Left below-knee popliteal to posterior tibial artery bypass with 9 reversed left greater saphenous vein;  Surgeon: [name], MD;  Location: [facility] OR;  Service: Vascular;  Laterality: Left;   HERNIA REPAIR     LIGATION OF COMPETING BRANCHES OF ARTERIOVENOUS FISTULA Left 06/29/2015   Procedure: LIGATION OF LEFT ARM RADIOCEPHALIC ARTERIOVENOUS FISTULA SIDE BRANCHES;  Surgeon: Conrad Greenfield, MD;  Location: [place];  Service: Vascular;  Laterality: Left;   MULTIPLE EXTRACTIONS WITH ALVEOLOPLASTY N/A 04/07/2017   Procedure: Extraction of tooth #'s 1-11, 13, 14,16, 20-23, and 26-28 with alveoloplasty;  Surgeon: Lenn Cal, DDS;  Location: [place];  Service: Oral Surgery;  Laterality: N/A;   Popliteal to posterior tibial bypass     2006   R knee arthoscopic repair of meniscus     UMBILICAL HERNIA REPAIR     Social History   Occupational History    Employer: DISABLED   Tobacco Use   Smoking status: Former    Types: Cigarettes    Quit date: 06/06/1979    Years since quitting: 42.4   Smokeless tobacco: Former  Scientific laboratory technician Use: Never used  Substance and Sexual Activity   Alcohol use: No     Alcohol/week: 0.0 standard drinks    Comment:   "years ago" , none now  Drug use: No   Sexual activity: Never

## 2021-11-16 ENCOUNTER — Ambulatory Visit (INDEPENDENT_AMBULATORY_CARE_PROVIDER_SITE_OTHER): Payer: Medicare Other | Admitting: Orthopedic Surgery

## 2021-11-16 DIAGNOSIS — L97919 Non-pressure chronic ulcer of unspecified part of right lower leg with unspecified severity: Secondary | ICD-10-CM

## 2021-11-16 DIAGNOSIS — L97411 Non-pressure chronic ulcer of right heel and midfoot limited to breakdown of skin: Secondary | ICD-10-CM

## 2021-11-16 DIAGNOSIS — L97929 Non-pressure chronic ulcer of unspecified part of left lower leg with unspecified severity: Secondary | ICD-10-CM | POA: Diagnosis not present

## 2021-11-16 DIAGNOSIS — I87333 Chronic venous hypertension (idiopathic) with ulcer and inflammation of bilateral lower extremity: Secondary | ICD-10-CM | POA: Diagnosis not present

## 2021-11-17 ENCOUNTER — Other Ambulatory Visit: Payer: Self-pay

## 2021-11-17 DIAGNOSIS — I739 Peripheral vascular disease, unspecified: Secondary | ICD-10-CM
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
## 2021-11-29 ENCOUNTER — Ambulatory Visit (INDEPENDENT_AMBULATORY_CARE_PROVIDER_SITE_OTHER): Payer: Medicare Other | Admitting: Cardiology

## 2021-11-29 ENCOUNTER — Encounter: Payer: Self-pay | Admitting: Cardiology

## 2021-11-29 ENCOUNTER — Other Ambulatory Visit: Payer: Self-pay

## 2021-11-29 VITALS — BP 199/70 | Ht 71.5 in | Wt 200.4 lb

## 2021-11-29 DIAGNOSIS — I4819 Other persistent atrial fibrillation: Secondary | ICD-10-CM | POA: Diagnosis not present

## 2021-11-29 DIAGNOSIS — I5032 Chronic diastolic (congestive) heart failure: Secondary | ICD-10-CM

## 2021-11-29 DIAGNOSIS — R778 Other specified abnormalities of plasma proteins: Secondary | ICD-10-CM | POA: Diagnosis not present

## 2021-11-29 DIAGNOSIS — R7989 Other specified abnormal findings of blood chemistry: Secondary | ICD-10-CM

## 2021-11-29 NOTE — Patient Instructions (Signed)
Medication Instructions:  Your Physician recommend you continue on your current medication as directed.    *If you need a refill on your cardiac medications before your next appointment, please call your pharmacy*   Follow-Up: At [facility], you and your health needs are our priority.  As part of our continuing mission to provide you with exceptional heart care, we have created designated Provider Care Teams.  These Care Teams include your primary Cardiologist (physician) and Advanced Practice Providers (APPs -  Physician Assistants and Nurse Practitioners) who all work together to provide you with the care you need, when you need it.  We recommend signing up for the patient portal called "MyChart".  Sign up information is provided on this After Visit Summary.  MyChart is used to connect with patients for Virtual Visits (Telemedicine).  Patients are able to view lab/test results, encounter notes, upcoming appointments, etc.  Non-urgent messages can be sent to your provider as well.   To learn more about what you can do with MyChart, go to [email or web address].    Your next appointment:   1 year(s)  The format for your next appointment:   In Person  Provider:   APP

## 2021-12-01 ENCOUNTER — Encounter: Payer: Self-pay | Admitting: Orthopedic Surgery

## 2021-12-01 NOTE — Progress Notes (Signed)
Office Visit Note   Patient: [name]           Date of Birth: 1951/09/25           MRN: [id number] Visit Date: 11/16/2021              Requested by: [name], [name] [place],  [place] [id number] PCP: [name], [name]  Chief Complaint  Patient presents with   Right Leg - Follow-up   Left Leg - Follow-up      [id number] is a 70 year old gentleman who was seen in follow-up for venous insufficiency right lower extremity as well as a right heel ulcer.  Patient has been undergoing compression wraps with silver alginate.  Patient states he has a follow-up appointment with vascular vein surgery December 23.  Assessment & Plan: Visit Diagnoses:  1. Non-pressure chronic ulcer of right heel and midfoot limited to breakdown of skin ([place])   2. Chronic venous hypertension (idiopathic) with ulcer and inflammation of bilateral lower extremity (HCC)     Plan: Follow-up in the office after the appointment with vascular vein surgery.  Continue with compression wraps to the right lower extremity.  Follow-Up Instructions: Return in about 4 weeks (around 12/14/2021).   Ortho Exam  Patient is alert, oriented, no adenopathy, well-dressed, normal affect, normal respiratory effort. Examination the leg has no cellulitis no odor no drainage.  The decubitus heel ulcer is stable.  Imaging: No results found. No images are attached to the encounter.  Labs: Lab Results  Component Value Date   HGBA1C 4.9 06/15/2021   HGBA1C 6.2 (H) 07/11/2018   HGBA1C 6.8 03/13/2017   ESRSEDRATE 105 (H) 06/20/2021   ESRSEDRATE 112 (H) 06/17/2021   ESRSEDRATE 95 (H) 06/16/2021   CRP 10.9 (H) 06/20/2021   CRP 20.5 (H) 06/17/2021   CRP 26.3 (H) 06/16/2021   LABURIC 9.7 (H) 02/23/2015   REPTSTATUS 06/22/2021 FINAL 06/17/2021   GRAMSTAIN  06/24/2011    RARE WBC PRESENT,BOTH PMN AND MONONUCLEAR NO ORGANISMS SEEN   GRAMSTAIN  06/24/2011    RARE WBC PRESENT,BOTH PMN AND MONONUCLEAR NO  ORGANISMS SEEN   CULT  06/17/2021    NO GROWTH 5 DAYS Performed at [facility], [place] [address], [place], [place] [address]    [place] MORGANII 08/21/2015     Lab Results  Component Value Date   ALBUMIN 2.3 (L) 06/24/2021   ALBUMIN 2.7 (L) 06/14/2021   ALBUMIN 2.7 (L) 09/12/2018    Lab Results  Component Value Date   MG 1.9 06/15/2021   MG 2.0 04/13/2017   MG 1.9 03/22/2017   No results found for: VD25OH  No results found for: PREALBUMIN CBC EXTENDED Latest Ref Rng & Units 07/07/2021 06/27/2021 06/26/2021  WBC 4.0 - 10.5 K/uL 10.4 14.3(H) 13.8(H)  RBC 4.22 - 5.81 MIL/uL 3.21(L) 2.48(L) 2.38(L)  HGB 13.0 - 17.0 g/dL 9.4(L) 7.4(L) 7.2(L)  HCT 39.0 - 52.0 % 30.5(L) 23.5(L) 22.4(L)  PLT 150 - 400 K/uL 219 283 273  NEUTROABS 1.7 - 7.7 K/uL 6.9 - -  LYMPHSABS 0.7 - 4.0 K/uL 2.0 - -     There is no height or weight on file to calculate BMI.  Orders:  No orders of the defined types were placed in this encounter.  No orders of the defined types were placed in this encounter.    Procedures: No procedures performed  Clinical Data: No additional findings.  ROS:  All other systems  negative, except as noted in the HPI. Review of Systems  Objective: Vital Signs: There were no vitals taken for this visit.  Specialty Comments:  No specialty comments available.  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, left leg ([place]) 06/28/2021   Sepsis due to cellulitis ([place]) 06/14/2021   Cellulitis of foot, left    Hypercalcemia 06/02/2020   Other disorders of phosphorus metabolism 01/04/2019   Venous stasis    History of endocarditis    Bradycardia    Orthostasis    Chest pain 09/10/2018   Hypovolemia associated with hemodialysis 07/11/2018   Weakness     Thrombocytopenia (HCC) 07/10/2018   Moderate protein-calorie malnutrition (HCC) 05/12/2017   Balanitis 04/25/2017   Penile swelling    Scrotal swelling    UTI (urinary tract infection) 04/13/2017   Anemia of chronic disease    Acute encephalopathy 04/12/2017   Urinary retention 04/10/2017   Gram-negative sepsis, unspecified ([place]) 03/29/2017   Bacteremia    RUQ abdominal pain    Gross hematuria    Other pancytopenia ([place])    Sepsis ([place]) 03/21/2017   Diarrhea 03/03/2017   Hyperkalemia 01/24/2017   Retained lens material following cataract surgery of both eyes 11/08/2016   Venous stasis ulcers of both lower extremities ([place]) 08/07/2015   Varicose veins of lower extremities with complications 95/08/3266   Malfunction of arteriovenous dialysis fistula (HCC)    CHF (congestive heart failure) ([place]) 06/23/2015   Headache, unspecified 06/23/2015   Iron deficiency anemia, unspecified 06/23/2015   Other specified coagulation defects ([place]) 06/23/2015   Pain, unspecified 06/23/2015   Pruritus, unspecified 06/23/2015   Secondary hyperparathyroidism of renal origin ([place]) 06/23/2015   Type 2 diabetes mellitus with diabetic peripheral angiopathy without gangrene ([place]) 06/23/2015   Lymphedema distichiasis syndrome with kidney disease and diabetes mellitus ([place]) 04/06/2015   Lymphedema of lower extremity 04/04/2015   Type II diabetes mellitus with nephropathy ([place]) 04/04/2015   MGUS (monoclonal gammopathy of unknown significance) 06/12/2014   Chronic diastolic heart failure ([place]) 10/30/2013   Special screening for malignant neoplasms, colon 07/09/2013   Proliferative diabetic retinopathy ([place]) 10/23/2012   Knee pain 08/21/2012   Mass of thigh 08/21/2012   FEVER UNSPECIFIED 05/06/2010   ORGANIC IMPOTENCE 12/02/2009   ONYCHOMYCOSIS 07/17/2009   Lymphedema 05/06/2009   ESRD on dialysis ([place]) 06/09/2008   Hyperlipidemia 03/19/2007   Class 2 severe obesity due to excess calories with serious  comorbidity and body mass index (BMI) of 35.0 to 35.9 in adult [facility]) 02/08/2007   Essential hypertension 02/08/2007   Atherosclerosis of native arteries of extremity with intermittent claudication ([place]) 02/08/2007   Past Medical History:  Diagnosis Date   Arthritis    Asthma    as a child   Chronic cystitis    Chronic diastolic heart failure ([place])    Chronic kidney disease    HD pt, 3 times a week.   Diabetes mellitus    Dysrhythmia    GERD (gastroesophageal reflux disease)    H/O hiatal hernia    states it's been fixed   Hyperlipidemia    Hypertension    Lymphedema    Morbid obesity ([place])    NEPHROLITHIASIS, HX OF 12/02/2009   Qualifier: Diagnosis of  By: [name], Cat     PVD (peripheral vascular disease) ([place])    Renal insufficiency  UMBILICAL HERNIA 2/69/4854   Qualifier: History of  By: [name] MD, Shane     Venous insufficiency     Family History  Problem Relation Age of Onset   Diabetes Mother    Heart disease Mother    Diabetes Brother     Past Surgical History:  Procedure Laterality Date   ABDOMINAL AORTOGRAM W/LOWER EXTREMITY N/A 05/19/2017   Procedure: Abdominal Aortogram w/Lower Extremity;  Surgeon: [name], MD;  Location: [place] [facility];  Service: Cardiovascular;  Laterality: N/A;   ABDOMINAL AORTOGRAM W/LOWER EXTREMITY N/A 06/18/2021   Procedure: ABDOMINAL AORTOGRAM W/LOWER EXTREMITY;  Surgeon: [name], MD;  Location: [place] [facility];  Service: Cardiovascular;  Laterality: N/A;   AMPUTATION     Right and left fifth toes.    AMPUTATION Left 06/18/2021   Procedure: Amputation transmetatarsal of toes 2 3 and 4 left foot ;  Surgeon: [name], MD;  Location: [facility] OR;  Service: Vascular;  Laterality: Left;   AMPUTATION TOE     emoval of both little toes   AV FISTULA PLACEMENT Left 03/21/2014   Procedure: ARTERIOVENOUS (AV) FISTULA CREATION with ultrasound;  Surgeon: [name], MD;  Location: [facility] OR;  Service: Vascular;  Laterality:  Left;   COLONOSCOPY     EYE SURGERY Bilateral    cataract and lens implant   FEMORAL-TIBIAL BYPASS GRAFT Left 06/18/2021   Procedure: Left below-knee popliteal to posterior tibial artery bypass with 9 reversed left greater saphenous vein;  Surgeon: [name], MD;  Location[facility] OR;  Service: Vascular;  Laterality: Left;   HERNIA REPAIR     LIGATION OF COMPETING BRANCHES OF ARTERIOVENOUS FISTULA Left 06/29/2015   Procedure: LIGATION OF LEFT ARM RADIOCEPHALIC ARTERIOVENOUS FISTULA SIDE BRANCHES;  Surgeon: Conrad Pryorsburg, MD;  Location: [place];  Service: Vascular;  Laterality: Left;   MULTIPLE EXTRACTIONS WITH ALVEOLOPLASTY N/A 04/07/2017   Procedure: Extraction of tooth #'s 1-11, 13, 14,16, 20-23, and 26-28 with alveoloplasty;  Surgeon: Lenn Cal, DDS;  Location: [place];  Service: Oral Surgery;  Laterality: N/A;   Popliteal to posterior tibial bypass     2006   R knee arthoscopic repair of meniscus     UMBILICAL HERNIA REPAIR     Social History   Occupational History    Employer: DISABLED   Tobacco Use   Smoking status: Former    Types: Cigarettes    Quit date: 06/06/1979    Years since quitting: 42.5   Smokeless tobacco: Former  Scientific laboratory technician Use: Never used  Substance and Sexual Activity   Alcohol use: No    Alcohol/week: 0.0 standard drinks    Comment:   "years ago" , none now   Drug use: No   Sexual activity: Never

## 2021-12-02 NOTE — Progress Notes (Deleted)
Office Note     CC: Right heel ulcer  Requesting Provider:  [name], [name]  HPI: [name] is a 70 y.o. (Sep 25, 1951) male presenting at the request of Meridee Score for nonhealing right heel ulcer.  Patient is known to the vascular surgery service having undergone left below-knee popliteal artery to posterior tibial artery bypass with reverse greater saphenous vein and amputation of his 2-4 left toes.  On exam today, ***  The pt is *** on a statin for cholesterol management.  The pt is *** on a daily aspirin.   Other AC:  *** The pt is *** on medication for hypertension.   The pt is *** diabetic.  Tobacco hx:  ***  Past Medical History:  Diagnosis Date   Arthritis    Asthma    as a child   Chronic cystitis    Chronic diastolic heart failure ([place])    Chronic kidney disease    HD pt, 3 times a week.   Diabetes mellitus    Dysrhythmia    GERD (gastroesophageal reflux disease)    H/O hiatal hernia    states it's been fixed   Hyperlipidemia    Hypertension    Lymphedema    Morbid obesity ([place])    NEPHROLITHIASIS, HX OF 12/02/2009   Qualifier: Diagnosis of  By: [name], Cat     PVD (peripheral vascular disease) ([place])    Renal insufficiency    UMBILICAL HERNIA 3/74/8270   Qualifier: History of  By: [name] MD, Shane     Venous insufficiency     Past Surgical History:  Procedure Laterality Date   ABDOMINAL AORTOGRAM W/LOWER EXTREMITY N/A 05/19/2017   Procedure: Abdominal Aortogram w/Lower Extremity;  Surgeon: [name], MD;  Location: [place] [facility];  Service: Cardiovascular;  Laterality: N/A;   ABDOMINAL AORTOGRAM W/LOWER EXTREMITY N/A 06/18/2021   Procedure: ABDOMINAL AORTOGRAM W/LOWER EXTREMITY;  Surgeon: [name], MD;  Location: [place] [facility];  Service: Cardiovascular;  Laterality: N/A;   AMPUTATION     Right and left fifth toes.    AMPUTATION Left 06/18/2021   Procedure: Amputation transmetatarsal of toes 2 3 and 4 left foot ;   Surgeon: [name], MD;  Location: [facility] OR;  Service: Vascular;  Laterality: Left;   AMPUTATION TOE     emoval of both little toes   AV FISTULA PLACEMENT Left 03/21/2014   Procedure: ARTERIOVENOUS (AV) FISTULA CREATION with ultrasound;  Surgeon: [name], MD;  Location: [facility] OR;  Service: Vascular;  Laterality: Left;   COLONOSCOPY     EYE SURGERY Bilateral    cataract and lens implant   FEMORAL-TIBIAL BYPASS GRAFT Left 06/18/2021   Procedure: Left below-knee popliteal to posterior tibial artery bypass with 9 reversed left greater saphenous vein;  Surgeon: [name], MD;  Location: [facility] OR;  Service: Vascular;  Laterality: Left;   HERNIA REPAIR     LIGATION OF COMPETING BRANCHES OF ARTERIOVENOUS FISTULA Left 06/29/2015   Procedure: LIGATION OF LEFT ARM RADIOCEPHALIC ARTERIOVENOUS FISTULA SIDE BRANCHES;  Surgeon: Conrad Pinconning, MD;  Location: [place];  Service: Vascular;  Laterality: Left;   MULTIPLE EXTRACTIONS WITH ALVEOLOPLASTY N/A 04/07/2017   Procedure: Extraction of tooth #'s 1-11, 13, 14,16, 20-23, and 26-28 with alveoloplasty;  Surgeon: Lenn Cal, DDS;  Location: [place];  Service: Oral Surgery;  Laterality: N/A;   Popliteal to posterior tibial bypass     2006   R knee  arthoscopic repair of meniscus     UMBILICAL HERNIA REPAIR      Social History   Socioeconomic History   Marital status: Legally Separated    Spouse name: Not on file   Number of children: 1   Years of education: Not on file   Highest education level: Not on file  Occupational History    Employer: DISABLED   Tobacco Use   Smoking status: Former    Types: Cigarettes    Quit date: 06/06/1979    Years since quitting: 42.5   Smokeless tobacco: Former  Scientific laboratory technician Use: Never used  Substance and Sexual Activity   Alcohol use: No    Alcohol/week: 0.0 standard drinks    Comment:   "years ago" , none now   Drug use: No   Sexual activity: Never  Other Topics Concern   Not on file  Social  History Narrative   Lives alone.  Only son is deceased.    Social Determinants of Health   Financial Resource Strain: Not on file  Food Insecurity: Not on file  Transportation Needs: Not on file  Physical Activity: Not on file  Stress: Not on file  Social Connections: Not on file  Intimate Partner Violence: Not on file   *** Family History  Problem Relation Age of Onset   Diabetes Mother    Heart disease Mother    Diabetes Brother     Current Outpatient Medications  Medication Sig Dispense Refill   acetaminophen (TYLENOL) 325 MG tablet Take 2 tablets (650 mg total) by mouth every 6 (six) hours as needed for mild pain (or Fever >/= 101).     Amino Acids-Protein Hydrolys (FEEDING SUPPLEMENT, PRO-STAT 64,) LIQD Take 30 mLs by mouth 3 (three) times daily with meals.     amLODipine (NORVASC) 10 MG tablet Take 10 mg by mouth daily.     apixaban (ELIQUIS) 5 MG TABS tablet Take 1 tablet (5 mg total) by mouth 2 (two) times daily. 60 tablet    atorvastatin (LIPITOR) 80 MG tablet Take 80 mg by mouth daily.     B Complex-C-Folic Acid (DIALYVITE 622) 0.8 MG TABS Take 1 tablet by mouth daily.     Darbepoetin Alfa (ARANESP) 100 MCG/0.5ML SOSY injection Inject 100 mcg into the skin.     docusate sodium (COLACE) 100 MG capsule Take 100 mg by mouth 2 (two) times daily.     glucagon (GLUCAGEN) 1 MG SOLR injection Inject 1 mg into the vein once as needed for low blood sugar.     levothyroxine (SYNTHROID) 50 MCG tablet Take 50 mcg by mouth daily before breakfast.     metoprolol succinate (TOPROL-XL) 25 MG 24 hr tablet Take 1 tablet (25 mg total) by mouth daily.     ondansetron (ZOFRAN) 4 MG tablet Take 1 tablet (4 mg total) by mouth every 6 (six) hours as needed for nausea. 20 tablet 0   pantoprazole (PROTONIX) 40 MG tablet Take 1 tablet (40 mg total) by mouth daily.     sucroferric oxyhydroxide (VELPHORO) 500 MG chewable tablet Chew 1 tablet (500 mg total) by mouth 3 (three) times daily with meals.  90 tablet    vitamin C (ASCORBIC ACID) 500 MG tablet Take 500 mg by mouth daily.     zinc sulfate 220 (50 Zn) MG capsule Take 220 mg by mouth daily.     No current facility-administered medications for this visit.    Allergies  Allergen Reactions  Other Swelling and Itching   Shrimp [Shellfish Allergy] Swelling     REVIEW OF SYSTEMS:  *** [X]  denotes positive finding, [ ]  denotes negative finding Cardiac  Comments:  Chest pain or chest pressure:    Shortness of breath upon exertion:    Short of breath when lying flat:    Irregular heart rhythm:        Vascular    Pain in calf, thigh, or hip brought on by ambulation:    Pain in feet at night that wakes you up from your sleep:     Blood clot in your veins:    Leg swelling:         Pulmonary    Oxygen at home:    Productive cough:     Wheezing:         Neurologic    Sudden weakness in arms or legs:     Sudden numbness in arms or legs:     Sudden onset of difficulty speaking or slurred speech:    Temporary loss of vision in one eye:     Problems with dizziness:         Gastrointestinal    Blood in stool:     Vomited blood:         Genitourinary    Burning when urinating:     Blood in urine:        Psychiatric    Major depression:         Hematologic    Bleeding problems:    Problems with blood clotting too easily:        Skin    Rashes or ulcers:        Constitutional    Fever or chills:      PHYSICAL EXAMINATION:  There were no vitals filed for this visit.  General:  WDWN in NAD; vital signs documented above Gait: Not observed HENT: WNL, normocephalic Pulmonary: normal non-labored breathing , without wheezing Cardiac: {Desc; regular/irreg:14544} HR, bruit*** Abdomen: soft, NT, no masses Skin: {With/Without:20273} rashes Vascular Exam/Pulses:  Right Left  Radial {Exam; arterial pulse strength 0-4:30167} {Exam; arterial pulse strength 0-4:30167}  Ulnar {Exam; arterial pulse strength 0-4:30167}  {Exam; arterial pulse strength 0-4:30167}  Femoral {Exam; arterial pulse strength 0-4:30167} {Exam; arterial pulse strength 0-4:30167}  Popliteal {Exam; arterial pulse strength 0-4:30167} {Exam; arterial pulse strength 0-4:30167}  DP {Exam; arterial pulse strength 0-4:30167} {Exam; arterial pulse strength 0-4:30167}  PT {Exam; arterial pulse strength 0-4:30167} {Exam; arterial pulse strength 0-4:30167}   Extremities: {With/Without:20273} ischemic changes, {With/Without:20273} Gangrene , {With/Without:20273} cellulitis; {With/Without:20273} open wounds;  Musculoskeletal: no muscle wasting or atrophy  Neurologic: A&O X 3;  No focal weakness or paresthesias are detected Psychiatric:  The pt has {Desc; normal/abnormal:11317::"Normal"} affect.   Non-Invasive Vascular Imaging:   ***    ASSESSMENT/PLAN: [name] is a 70 y.o. male presenting with ***   ***   Broadus John, MD [facility] [phone]

## 2021-12-03 ENCOUNTER — Encounter (HOSPITAL_COMMUNITY): Payer: Medicare Other

## 2021-12-03 ENCOUNTER — Ambulatory Visit: Payer: Medicare Other | Admitting: Vascular Surgery

## 2021-12-14 ENCOUNTER — Ambulatory Visit: Payer: Medicare Other | Admitting: Orthopedic Surgery

## 2021-12-16 ENCOUNTER — Other Ambulatory Visit: Payer: Self-pay

## 2021-12-16 ENCOUNTER — Ambulatory Visit (INDEPENDENT_AMBULATORY_CARE_PROVIDER_SITE_OTHER): Payer: Medicare Other | Admitting: Orthopedic Surgery

## 2021-12-16 DIAGNOSIS — L97929 Non-pressure chronic ulcer of unspecified part of left lower leg with unspecified severity: Secondary | ICD-10-CM

## 2021-12-16 DIAGNOSIS — L97919 Non-pressure chronic ulcer of unspecified part of right lower leg with unspecified severity: Secondary | ICD-10-CM | POA: Diagnosis not present

## 2021-12-16 DIAGNOSIS — I87333 Chronic venous hypertension (idiopathic) with ulcer and inflammation of bilateral lower extremity: Secondary | ICD-10-CM | POA: Diagnosis not present

## 2021-12-16 DIAGNOSIS — L97411 Non-pressure chronic ulcer of right heel and midfoot limited to breakdown of skin: Secondary | ICD-10-CM

## 2021-12-16 DIAGNOSIS — I739 Peripheral vascular disease, unspecified: Secondary | ICD-10-CM | POA: Diagnosis not present

## 2022-01-16 ENCOUNTER — Encounter: Payer: Self-pay | Admitting: Orthopedic Surgery

## 2022-01-16 NOTE — Progress Notes (Signed)
Office Visit Note   Patient: [name]           Date of Birth: August 02, 1951           MRN: [id number] Visit Date: 12/16/2021              Requested by: [name], [name] [place],  [place] [address] PCP: [name], [name]  Chief Complaint  Patient presents with   Left Leg - Follow-up   Right Leg - Follow-up    Nonpressure ulcer of right heel      HPI: Patient is a 71 year old gentleman with bilateral lower extremity venous and arterial insufficiency ulcers.  He also has an ulcer on the right heel.  Patient was previously scheduled for vascular vein evaluation however he states that they were out of power when he went for the appointment and appointment has not been rescheduled.  He is currently undergoing compression wraps bilaterally.  Assessment & Plan: Visit Diagnoses:  1. Non-pressure chronic ulcer of right heel and midfoot limited to breakdown of skin ([place])   2. Chronic venous hypertension (idiopathic) with ulcer and inflammation of bilateral lower extremity (HCC)   3. PVD (peripheral vascular disease) ([place])     Plan: Follow-up with vascular vein surgery for ABIs and arterial studies.  Follow-Up Instructions: Return in about 2 weeks (around 12/30/2021).   Ortho Exam  Patient is alert, oriented, no adenopathy, well-dressed, normal affect, normal respiratory effort. Examination patient has venous stasis insufficiency with brawny swelling and ulcerations of both legs with dermatitis and blisters.  He does have a decubitus heel ulcer there is no exposed bone he does not have palpable pulses.  Imaging: No results found. No images are attached to the encounter.  Labs: Lab Results  Component Value Date   HGBA1C 4.9 06/15/2021   HGBA1C 6.2 (H) 07/11/2018   HGBA1C 6.8 03/13/2017   ESRSEDRATE 105 (H) 06/20/2021   ESRSEDRATE 112 (H) 06/17/2021   ESRSEDRATE 95 (H) 06/16/2021   CRP 10.9 (H) 06/20/2021   CRP 20.5 (H) 06/17/2021   CRP 26.3 (H)  06/16/2021   LABURIC 9.7 (H) 02/23/2015   REPTSTATUS 06/22/2021 FINAL 06/17/2021   GRAMSTAIN  06/24/2011    RARE WBC PRESENT,BOTH PMN AND MONONUCLEAR NO ORGANISMS SEEN   GRAMSTAIN  06/24/2011    RARE WBC PRESENT,BOTH PMN AND MONONUCLEAR NO ORGANISMS SEEN   CULT  06/17/2021    NO GROWTH 5 DAYS Performed at [facility], [place] [address], [place], [place] [address]    [place] MORGANII 08/21/2015     Lab Results  Component Value Date   ALBUMIN 2.3 (L) 06/24/2021   ALBUMIN 2.7 (L) 06/14/2021   ALBUMIN 2.7 (L) 09/12/2018    Lab Results  Component Value Date   MG 1.9 06/15/2021   MG 2.0 04/13/2017   MG 1.9 03/22/2017   No results found for: VD25OH  No results found for: PREALBUMIN CBC EXTENDED Latest Ref Rng & Units 07/07/2021 06/27/2021 06/26/2021  WBC 4.0 - 10.5 K/uL 10.4 14.3(H) 13.8(H)  RBC 4.22 - 5.81 MIL/uL 3.21(L) 2.48(L) 2.38(L)  HGB 13.0 - 17.0 g/dL 9.4(L) 7.4(L) 7.2(L)  HCT 39.0 - 52.0 % 30.5(L) 23.5(L) 22.4(L)  PLT 150 - 400 K/uL 219 283 273  NEUTROABS 1.7 - 7.7 K/uL 6.9 - -  LYMPHSABS 0.7 - 4.0 K/uL 2.0 - -     There is no height or weight on file to calculate BMI.  Orders:  No  orders of the defined types were placed in this encounter.  No orders of the defined types were placed in this encounter.    Procedures: No procedures performed  Clinical Data: No additional findings.  ROS:  All other systems negative, except as noted in the HPI. Review of Systems  Objective: Vital Signs: There were no vitals taken for this visit.  Specialty Comments:  No specialty comments available.  PMFS History: Patient Active Problem List   Diagnosis Date Noted   Persistent atrial fibrillation (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, left leg ([place]) 06/28/2021   Sepsis due to cellulitis ([place]) 06/14/2021   Cellulitis of foot, left     Hypercalcemia 06/02/2020   Other disorders of phosphorus metabolism 01/04/2019   Venous stasis    History of endocarditis    Bradycardia    Orthostasis    Chest pain 09/10/2018   Hypovolemia associated with hemodialysis 07/11/2018   Weakness    Thrombocytopenia (HCC) 07/10/2018   Moderate protein-calorie malnutrition (HCC) 05/12/2017   Balanitis 04/25/2017   Penile swelling    Scrotal swelling    UTI (urinary tract infection) 04/13/2017   Anemia of chronic disease    Acute encephalopathy 04/12/2017   Urinary retention 04/10/2017   Gram-negative sepsis, unspecified ([place]) 03/29/2017   Bacteremia    RUQ abdominal pain    Gross hematuria    Other pancytopenia ([place])    Sepsis ([place]) 03/21/2017   Diarrhea 03/03/2017   Hyperkalemia 01/24/2017   Retained lens material following cataract surgery of both eyes 11/08/2016   Venous stasis ulcers of both lower extremities ([place]) 08/07/2015   Varicose veins of lower extremities with complications 56/43/3295   Malfunction of arteriovenous dialysis fistula (HCC)    CHF (congestive heart failure) ([place]) 06/23/2015   Headache, unspecified 06/23/2015   Iron deficiency anemia, unspecified 06/23/2015   Other specified coagulation defects ([place]) 06/23/2015   Pain, unspecified 06/23/2015   Pruritus, unspecified 06/23/2015   Secondary hyperparathyroidism of renal origin ([place]) 06/23/2015   Type 2 diabetes mellitus with diabetic peripheral angiopathy without gangrene ([place]) 06/23/2015   Lymphedema distichiasis syndrome with kidney disease and diabetes mellitus ([place]) 04/06/2015   Lymphedema of lower extremity 04/04/2015   Type II diabetes mellitus with nephropathy ([place]) 04/04/2015   MGUS (monoclonal gammopathy of unknown significance) 06/12/2014   Chronic diastolic heart failure ([place]) 10/30/2013   Special screening for malignant neoplasms, colon 07/09/2013   Proliferative diabetic retinopathy ([place]) 10/23/2012   Knee pain 08/21/2012   Mass of thigh  08/21/2012   FEVER UNSPECIFIED 05/06/2010   ORGANIC IMPOTENCE 12/02/2009   ONYCHOMYCOSIS 07/17/2009   Lymphedema 05/06/2009   ESRD on dialysis ([place]) 06/09/2008   Hyperlipidemia 03/19/2007   Class 2 severe obesity due to excess calories with serious comorbidity and body mass index (BMI) of 35.0 to 35.9 in adult [facility]) 02/08/2007   Essential hypertension 02/08/2007   Atherosclerosis of native arteries of extremity with intermittent claudication ([place]) 02/08/2007   Past Medical History:  Diagnosis Date   Arthritis    Asthma    as a child   Chronic cystitis    Chronic diastolic heart failure ([place])    Chronic kidney disease    HD pt, 3 times a week.   Diabetes mellitus    Dysrhythmia    GERD (gastroesophageal reflux disease)    H/O hiatal hernia    states it's been fixed   Hyperlipidemia    Hypertension  Lymphedema    Morbid obesity ([place])    NEPHROLITHIASIS, HX OF 12/02/2009   Qualifier: Diagnosis of  By: [name], Cat     PVD (peripheral vascular disease) ([place])    Renal insufficiency    UMBILICAL HERNIA 6/64/4034   Qualifier: History of  By: [name] MD, Shane     Venous insufficiency     Family History  Problem Relation Age of Onset   Diabetes Mother    Heart disease Mother    Diabetes Brother     Past Surgical History:  Procedure Laterality Date   ABDOMINAL AORTOGRAM W/LOWER EXTREMITY N/A 05/19/2017   Procedure: Abdominal Aortogram w/Lower Extremity;  Surgeon: [name], MD;  Location: [place] [facility];  Service: Cardiovascular;  Laterality: N/A;   ABDOMINAL AORTOGRAM W/LOWER EXTREMITY N/A 06/18/2021   Procedure: ABDOMINAL AORTOGRAM W/LOWER EXTREMITY;  Surgeon: [name], MD;  Location: [place] [facility];  Service: Cardiovascular;  Laterality: N/A;   AMPUTATION     Right and left fifth toes.    AMPUTATION Left 06/18/2021   Procedure: Amputation transmetatarsal of toes 2 3 and 4 left foot ;  Surgeon: [name], MD;  Location: [facility] OR;  Service:  Vascular;  Laterality: Left;   AMPUTATION TOE     emoval of both little toes   AV FISTULA PLACEMENT Left 03/21/2014   Procedure: ARTERIOVENOUS (AV) FISTULA CREATION with ultrasound;  Surgeon: [name], MD;  [facility];  Service: Vascular;  Laterality: Left;   COLONOSCOPY     EYE SURGERY Bilateral    cataract and lens implant   FEMORAL-TIBIAL BYPASS GRAFT Left 06/18/2021   Procedure: Left below-knee popliteal to posterior tibial artery bypass with 9 reversed left greater saphenous vein;  Surgeon: [name], MD;  Location: [facility] OR;  Service: Vascular;  Laterality: Left;   HERNIA REPAIR     LIGATION OF COMPETING BRANCHES OF ARTERIOVENOUS FISTULA Left 06/29/2015   Procedure: LIGATION OF LEFT ARM RADIOCEPHALIC ARTERIOVENOUS FISTULA SIDE BRANCHES;  Surgeon: Conrad [place]nie[place], MD;  Location: [place];  Service: Vascular;  Laterality: Left;   MULTIPLE EXTRACTIONS WITH ALVEOLOPLASTY N/A 04/07/2017   Procedure: Extraction of tooth #'s 1-11, 13, 14,16, 20-23, and 26-28 with alveoloplasty;  Surgeon: Lenn Cal, DDS;  Location: [place];  Service: Oral Surgery;  Laterality: N/A;   Popliteal to posterior tibial bypass     2006   R knee arthoscopic repair of meniscus     UMBILICAL HERNIA REPAIR     Social History   Occupational History    Employer: DISABLED   Tobacco Use   Smoking status: Former    Types: Cigarettes    Quit date: 06/06/1979    Years since quitting: 42.6   Smokeless tobacco: Former  Scientific laboratory technician Use: Never used  Substance and Sexual Activity   Alcohol use: No    Alcohol/week: 0.0 standard drinks    Comment:   "years ago" , none now   Drug use: No   Sexual activity: Never

## 2022-01-17 ENCOUNTER — Other Ambulatory Visit: Payer: Self-pay | Admitting: Orthopedic Surgery

## 2022-01-17 ENCOUNTER — Encounter: Payer: Self-pay | Admitting: Orthopedic Surgery

## 2022-01-17 ENCOUNTER — Ambulatory Visit (INDEPENDENT_AMBULATORY_CARE_PROVIDER_SITE_OTHER): Payer: Medicare Other | Admitting: Orthopedic Surgery

## 2022-01-17 ENCOUNTER — Other Ambulatory Visit: Payer: Self-pay

## 2022-01-17 DIAGNOSIS — I739 Peripheral vascular disease, unspecified: Secondary | ICD-10-CM

## 2022-01-17 DIAGNOSIS — L97411 Non-pressure chronic ulcer of right heel and midfoot limited to breakdown of skin: Secondary | ICD-10-CM | POA: Diagnosis not present

## 2022-01-17 NOTE — Progress Notes (Signed)
Office Visit Note   Patient: [name]           Date of Birth: [birth year]           MRN: [id number] Visit Date: 01/17/2022              Requested by: [name], [name] [place],  [place] [address] PCP: [name], [name]  Chief Complaint  Patient presents with   Other    Follow up right heel wound      HPI: Patient is a 71 year old gentleman presents in follow-up.  He is a resident at [facility] currently ambulates in a wheelchair using a PRAFO to unload the right heel ulcer.  Patient states he feels like it is getting better.  Assessment & Plan: Visit Diagnoses:  1. Non-pressure chronic ulcer of right heel and midfoot limited to breakdown of skin ([place])     Plan: We will continue with routine wound care and pressure offloading.  Patient's ABIs have been ordered to be repeated in December I do not see any new values.    May need to repeat the ABI order at follow-up.  I will make a referral to vascular vein surgery.  Follow-Up Instructions: Return in about 4 weeks (around 02/14/2022).   Ortho Exam  Patient is alert, oriented, no adenopathy, well-dressed, normal affect, normal respiratory effort. Examination patient has a superficial ulcer on the right heel that is 2 cm in diameter 2 mm deep there is is no exposed bone or tendon.  No cellulitis no purulent drainage.  Patient's previous ankle-brachial indices showed a dorsalis pedis ABI of 0.64 biphasic flow with a posterior tibial monophasic flow of 0.92 with a great toe pressure of 47.  Patient's last arteriogram study of the right lower extremity was performed in July of last year.  Imaging: No results found. No images are attached to the encounter.  Labs: Lab Results  Component Value Date   HGBA1C 4.9 06/15/2021   HGBA1C 6.2 (H) 07/11/2018   HGBA1C 6.8 03/13/2017   ESRSEDRATE 105 (H) 06/20/2021   ESRSEDRATE 112 (H) 06/17/2021   ESRSEDRATE 95 (H) 06/16/2021   CRP 10.9 (H) 06/20/2021    CRP 20.5 (H) 06/17/2021   CRP 26.3 (H) 06/16/2021   LABURIC 9.7 (H) 02/23/2015   REPTSTATUS 06/22/2021 FINAL 06/17/2021   GRAMSTAIN  06/24/2011    RARE WBC PRESENT,BOTH PMN AND MONONUCLEAR NO ORGANISMS SEEN   GRAMSTAIN  06/24/2011    RARE WBC PRESENT,BOTH PMN AND MONONUCLEAR NO ORGANISMS SEEN   CULT  06/17/2021    NO GROWTH 5 DAYS Performed at [facility], [place] [address] [place], [place] [address]    [place] MORGANII 08/21/2015     Lab Results  Component Value Date   ALBUMIN 2.3 (L) 06/24/2021   ALBUMIN 2.7 (L) 06/14/2021   ALBUMIN 2.7 (L) 09/12/2018    Lab Results  Component Value Date   MG 1.9 06/15/2021   MG 2.0 04/13/2017   MG 1.9 03/22/2017   No results found for: VD25OH  No results found for: PREALBUMIN CBC EXTENDED Latest Ref Rng & Units 07/07/2021 06/27/2021 06/26/2021  WBC 4.0 - 10.5 K/uL 10.4 14.3(H) 13.8(H)  RBC 4.22 - 5.81 MIL/uL 3.21(L) 2.48(L) 2.38(L)  HGB 13.0 - 17.0 g/dL 9.4(L) 7.4(L) 7.2(L)  HCT 39.0 - 52.0 % 30.5(L) 23.5(L) 22.4(L)  PLT 150 - 400 K/uL 219 283 273  NEUTROABS 1.7 - 7.7 K/uL 6.9 - -  LYMPHSABS 0.7 - 4.0 K/uL 2.0 - -     There is no height or weight on file to calculate BMI.  Orders:  No orders of the defined types were placed in this encounter.  No orders of the defined types were placed in this encounter.    Procedures: No procedures performed  Clinical Data: No additional findings.  ROS:  All other systems negative, except as noted in the HPI. Review of Systems  Objective: Vital Signs: There were no vitals taken for this visit.  Specialty Comments:  No specialty comments available.  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, left leg ([place]) 06/28/2021   Sepsis due to cellulitis ([place]) 06/14/2021    Cellulitis of foot, left    Hypercalcemia 06/02/2020   Other disorders of phosphorus metabolism 01/04/2019   Venous stasis    History of endocarditis    Bradycardia    Orthostasis    Chest pain 09/10/2018   Hypovolemia associated with hemodialysis 07/11/2018   Weakness    Thrombocytopenia (HCC) 07/10/2018   Moderate protein-calorie malnutrition (HCC) 05/12/2017   Balanitis 04/25/2017   Penile swelling    Scrotal swelling    UTI (urinary tract infection) 04/13/2017   Anemia of chronic disease    Acute encephalopathy 04/12/2017   Urinary retention 04/10/2017   Gram-negative sepsis, unspecified ([place]) 03/29/2017   Bacteremia    RUQ abdominal pain    Gross hematuria    Other pancytopenia ([place])    Sepsis ([place]) 03/21/2017   Diarrhea 03/03/2017   Hyperkalemia 01/24/2017   Retained lens material following cataract surgery of both eyes 11/08/2016   Venous stasis ulcers of both lower extremities ([place]) 08/07/2015   Varicose veins of lower extremities with complications 51/76/1607   Malfunction of arteriovenous dialysis fistula (HCC)    CHF (congestive heart failure) ([place]) 06/23/2015   Headache, unspecified 06/23/2015   Iron deficiency anemia, unspecified 06/23/2015   Other specified coagulation defects ([place]) 06/23/2015   Pain, unspecified 06/23/2015   Pruritus, unspecified 06/23/2015   Secondary hyperparathyroidism of renal origin ([place]) 06/23/2015   Type 2 diabetes mellitus with diabetic peripheral angiopathy without gangrene ([place]) 06/23/2015   Lymphedema distichiasis syndrome with kidney disease and diabetes mellitus ([place]) 04/06/2015   Lymphedema of lower extremity 04/04/2015   Type II diabetes mellitus with nephropathy ([place]) 04/04/2015   MGUS (monoclonal gammopathy of unknown significance) 06/12/2014   Chronic diastolic heart failure ([place]) 10/30/2013   Special screening for malignant neoplasms, colon 07/09/2013   Proliferative diabetic retinopathy ([place]) 10/23/2012   Knee pain  08/21/2012   Mass of thigh 08/21/2012   FEVER UNSPECIFIED 05/06/2010   ORGANIC IMPOTENCE 12/02/2009   ONYCHOMYCOSIS 07/17/2009   Lymphedema 05/06/2009   ESRD on dialysis ([place]) 06/09/2008   Hyperlipidemia 03/19/2007   Class 2 severe obesity due to excess calories with serious comorbidity and body mass index (BMI) of 35.0 to 35.9 in adult [facility]) 02/08/2007   Essential hypertension 02/08/2007   Atherosclerosis of native arteries of extremity with intermittent claudication ([place]) 02/08/2007   Past Medical History:  Diagnosis Date   Arthritis    Asthma    as a child   Chronic cystitis    Chronic diastolic heart failure ([place])    Chronic kidney disease    HD pt, 3 times a week.   Diabetes mellitus    Dysrhythmia  GERD (gastroesophageal reflux disease)    H/O hiatal hernia    states it's been fixed   Hyperlipidemia    Hypertension    Lymphedema    Morbid obesity ([place])    NEPHROLITHIASIS, HX OF 12/02/2009   Qualifier: Diagnosis of  By: [name], Cat     PVD (peripheral vascular disease) ([place])    Renal insufficiency    UMBILICAL HERNIA 4/56/2563   Qualifier: History of  By: [name] MD, Shane     Venous insufficiency     Family History  Problem Relation Age of Onset   Diabetes Mother    Heart disease Mother    Diabetes Brother     Past Surgical History:  Procedure Laterality Date   ABDOMINAL AORTOGRAM W/LOWER EXTREMITY N/A 05/19/2017   Procedure: Abdominal Aortogram w/Lower Extremity;  Surgeon: [name], MD;  Location: [place] [facility];  Service: Cardiovascular;  Laterality: N/A;   ABDOMINAL AORTOGRAM W/LOWER EXTREMITY N/A 06/18/2021   Procedure: ABDOMINAL AORTOGRAM W/LOWER EXTREMITY;  Surgeon: [name], MD;  Location: [place] [facility];  Service: Cardiovascular;  Laterality: N/A;   AMPUTATION     Right and left fifth toes.    AMPUTATION Left 06/18/2021   Procedure: Amputation transmetatarsal of toes 2 3 and 4 left foot ;  Surgeon: [name], MD;   Location[facility] OR;  Service: Vascular;  Laterality: Left;   AMPUTATION TOE     emoval of both little toes   AV FISTULA PLACEMENT Left 03/21/2014   Procedure: ARTERIOVENOUS (AV) FISTULA CREATION with ultrasound;  Surgeon: [name], MD;  Location: [facility] OR;  Service: Vascular;  Laterality: Left;   COLONOSCOPY     EYE SURGERY Bilateral    cataract and lens implant   FEMORAL-TIBIAL BYPASS GRAFT Left 06/18/2021   Procedure: Left below-knee popliteal to posterior tibial artery bypass with 9 reversed left greater saphenous vein;  Surgeon: [name], MD;  Location: [facility] OR;  Service: Vascular;  Laterality: Left;   HERNIA REPAIR     LIGATION OF COMPETING BRANCHES OF ARTERIOVENOUS FISTULA Left 06/29/2015   Procedure: LIGATION OF LEFT ARM RADIOCEPHALIC ARTERIOVENOUS FISTULA SIDE BRANCHES;  Surgeon: Conrad [place], MD;  Location: [place];  Service: Vascular;  Laterality: Left;   MULTIPLE EXTRACTIONS WITH ALVEOLOPLASTY N/A 04/07/2017   Procedure: Extraction of tooth #'s 1-11, 13, 14,16, 20-23, and 26-28 with alveoloplasty;  Surgeon: Lenn Cal, DDS;  Location: [place];  Service: Oral Surgery;  Laterality: N/A;   Popliteal to posterior tibial bypass     2006   R knee arthoscopic repair of meniscus     UMBILICAL HERNIA REPAIR     Social History   Occupational History    Employer: DISABLED   Tobacco Use   Smoking status: Former    Types: Cigarettes    Quit date: 06/06/1979    Years since quitting: 42.6   Smokeless tobacco: Former  Scientific laboratory technician Use: Never used  Substance and Sexual Activity   Alcohol use: No    Alcohol/week: 0.0 standard drinks    Comment:   "years ago" , none now   Drug use: No   Sexual activity: Never

## 2022-01-25 ENCOUNTER — Telehealth: Payer: Self-pay

## 2022-01-25 ENCOUNTER — Other Ambulatory Visit: Payer: Self-pay

## 2022-01-25 DIAGNOSIS — L97411 Non-pressure chronic ulcer of right heel and midfoot limited to breakdown of skin: Secondary | ICD-10-CM

## 2022-01-25 NOTE — Telephone Encounter (Signed)
Reuben Likes with [facility] called stating that she needs an order put into Epic for ABI.  Stated that it was put in as a referral.  Please advise.  Thank you.

## 2022-01-25 NOTE — Telephone Encounter (Signed)
[facility] called and sw Sonya she was able to scheduled the patient and did not need updated referral. He is scheduled for 02/03/22 @11  am. [facility] [phone] and had to leave a message on VM for the nurse taking care of the pt. Advised that if they could not arrange transportation or if there was a conflict with the appt to call [facility] direct [phone] to reschedule appt.

## 2022-02-03 ENCOUNTER — Encounter (HOSPITAL_COMMUNITY): Payer: Medicare Other

## 2022-02-14 ENCOUNTER — Ambulatory Visit: Payer: Medicare Other | Admitting: Orthopedic Surgery

## 2022-03-08 ENCOUNTER — Ambulatory Visit (INDEPENDENT_AMBULATORY_CARE_PROVIDER_SITE_OTHER): Payer: Medicare Other | Admitting: Orthopedic Surgery

## 2022-03-08 ENCOUNTER — Other Ambulatory Visit: Payer: Self-pay

## 2022-03-08 DIAGNOSIS — L97411 Non-pressure chronic ulcer of right heel and midfoot limited to breakdown of skin: Secondary | ICD-10-CM | POA: Diagnosis not present

## 2022-03-09 ENCOUNTER — Encounter: Payer: Self-pay | Admitting: Orthopedic Surgery

## 2022-03-09 NOTE — Progress Notes (Signed)
? ?Office Visit Note ?  ?Patient: [name]           ?Date of Birth: 1951/09/09           ?MRN: [id number] ?Visit Date: 03/08/2022 ?             ?Requested by: [name], [name] ?[address] ?[place],  [place] [address] ?PCP: [name], [name] ? ?Chief Complaint  ?Patient presents with  ? Right Foot - Wound Check, Follow-up  ? ? ? ? ?HPI: ?Patient is a 71 year old gentleman who presents in follow-up for chronic right heel ulcer.  Patient states he has been having increased swelling in the right leg. ? ?Assessment & Plan: ?Visit Diagnoses:  ?1. Non-pressure chronic ulcer of right heel and midfoot limited to breakdown of skin ([place])   ? ? ?Plan: Patient's last ankle-brachial indices was September of last year a repeat ankle-brachial indices was ordered for December but not obtained.  We will place an order for vascular vein surgery consult and ankle-brachial indices. ? ?Follow-Up Instructions: Return in about 4 weeks (around 04/05/2022).  ? ?Ortho Exam ? ?Patient is alert, oriented, no adenopathy, well-dressed, normal affect, normal respiratory effort. ?Patient has a persistent ischemic ulcer over the right heel.  There is brawny edema with lymphatic and venous insufficiency of the right lower extremity with increased swelling.  The right heel ulcer is 10 mm in diameter 5 mm deep there is no exposed bone or tendon.  With the Doppler patient has a biphasic anterior tibial pulse and a monophasic dorsalis pedis pulse. ? ?Imaging: ?No results found. ?No images are attached to the encounter. ? ?Labs: ?Lab Results  ?Component Value Date  ? HGBA1C 4.9 06/15/2021  ? HGBA1C 6.2 (H) 07/11/2018  ? HGBA1C 6.8 03/13/2017  ? ESRSEDRATE 105 (H) 06/20/2021  ? ESRSEDRATE 112 (H) 06/17/2021  ? ESRSEDRATE 95 (H) 06/16/2021  ? CRP 10.9 (H) 06/20/2021  ? CRP 20.5 (H) 06/17/2021  ? CRP 26.3 (H) 06/16/2021  ? LABURIC 9.7 (H) 02/23/2015  ? REPTSTATUS 06/22/2021 FINAL 06/17/2021  ? GRAMSTAIN  06/24/2011  ?  RARE WBC PRESENT,BOTH  PMN AND MONONUCLEAR ?NO ORGANISMS SEEN  ? GRAMSTAIN  06/24/2011  ?  RARE WBC PRESENT,BOTH PMN AND MONONUCLEAR ?NO ORGANISMS SEEN  ? CULT  06/17/2021  ?  NO GROWTH 5 DAYS ?Performed at [facility], [place] [address] [place], [place] [address] ?  ? LABORGA MORGANELLA MORGANII 08/21/2015  ? ? ? ?Lab Results  ?Component Value Date  ? ALBUMIN 2.3 (L) 06/24/2021  ? ALBUMIN 2.7 (L) 06/14/2021  ? ALBUMIN 2.7 (L) 09/12/2018  ? ? ?Lab Results  ?Component Value Date  ? MG 1.9 06/15/2021  ? MG 2.0 04/13/2017  ? MG 1.9 03/22/2017  ? ?No results found for: VD25OH ? ?No results found for: PREALBUMIN ? ?  Latest Ref Rng & Units 07/07/2021  ? 12:17 AM 06/27/2021  ? 12:47 AM 06/26/2021  ?  1:06 AM  ?CBC EXTENDED  ?WBC 4.0 - 10.5 K/uL 10.4   14.3   13.8    ?RBC 4.22 - 5.81 MIL/uL 3.21   2.48   2.38    ?Hemoglobin 13.0 - 17.0 g/dL 9.4   7.4   7.2    ?HCT 39.0 - 52.0 % 30.5   23.5   22.4    ?Platelets 150 - 400 K/uL 219   283   273    ?NEUT# 1.7 - 7.7 K/uL 6.9      ?  Lymph# 0.7 - 4.0 K/uL 2.0      ? ? ? ?There is no height or weight on file to calculate BMI. ? ?Orders:  ?Orders Placed This Encounter  ?Procedures  ? Ambulatory referral to Vascular Surgery  ? ?No orders of the defined types were placed in this encounter. ? ? ? Procedures: ?No procedures performed ? ?Clinical Data: ?No additional findings. ? ?ROS: ? ?All other systems negative, except as noted in the HPI. ?Review of Systems ? ?Objective: ?Vital Signs: There were no vitals taken for this visit. ? ?Specialty Comments:  ?No specialty comments available. ? ?PMFS History: ?Patient Active Problem List  ? Diagnosis Date Noted  ? Persistent atrial fibrillation ([place]) 10/28/2021  ? Elevated troponin 10/28/2021  ? 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 Sepsis due to cellulitis ([place]) 06/14/2021  ? Cellulitis of foot, left   ? Hypercalcemia 06/02/2020  ? Other disorders of phosphorus metabolism  01/04/2019  ? Venous stasis   ? History of endocarditis   ? Bradycardia   ? Orthostasis   ? Chest pain 09/10/2018  ? Hypovolemia associated with hemodialysis 07/11/2018  ? Weakness   ? Thrombocytopenia ([place]) 07/10/2018  ? Moderate protein-calorie malnutrition ([place]) 05/12/2017  ? Balanitis 04/25/2017  ? Penile swelling   ? Scrotal swelling   ? UTI (urinary tract infection) 04/13/2017  ? Anemia of chronic disease   ? Acute encephalopathy 04/12/2017  ? Urinary retention 04/10/2017  ? Gram-negative sepsis, unspecified ([place]) 03/29/2017  ? Bacteremia   ? RUQ abdominal pain   ? Gross hematuria   ? Other pancytopenia ([place])   ? Sepsis ([place]) 03/21/2017  ? Diarrhea 03/03/2017  ? Hyperkalemia 01/24/2017  ? Retained lens material following cataract surgery of both eyes 11/08/2016  ? Venous stasis ulcers of both lower extremities ([place]) 08/07/2015  ? Varicose veins of lower extremities with complications 78/46/9629  ? Malfunction of arteriovenous dialysis fistula (HCC)   ? CHF (congestive heart failure) ([place]) 06/23/2015  ? Headache, unspecified 06/23/2015  ? Iron deficiency anemia, unspecified 06/23/2015  ? Other specified coagulation defects ([place]) 06/23/2015  ? Pain, unspecified 06/23/2015  ? Pruritus, unspecified 06/23/2015  ? Secondary hyperparathyroidism of renal origin ([place]) 06/23/2015  ? Type 2 diabetes mellitus with diabetic peripheral angiopathy without gangrene ([place]) 06/23/2015  ? Lymphedema distichiasis syndrome with kidney disease and diabetes mellitus ([place]) 04/06/2015  ? Lymphedema of lower extremity 04/04/2015  ? Type II diabetes mellitus with nephropathy ([place]) 04/04/2015  ? MGUS (monoclonal gammopathy of unknown significance) 06/12/2014  ? Chronic diastolic heart failure ([place]) 10/30/2013  ? Special screening for malignant neoplasms, colon 07/09/2013  ? Proliferative diabetic retinopathy ([place]) 10/23/2012  ? Knee pain 08/21/2012  ? Mass of thigh 08/21/2012  ? FEVER UNSPECIFIED 05/06/2010  ? ORGANIC IMPOTENCE 12/02/2009   ? ONYCHOMYCOSIS 07/17/2009  ? Lymphedema 05/06/2009  ? ESRD on dialysis ([employer]) 06/09/2008  ? Hyperlipidemia 03/19/2007  ? Class 2 severe obesity due to excess calories with serious comorbidity and body mass index (BMI) of 35.0 to 35.9 in adult [facility]) 02/08/2007  ? Essential hypertension 02/08/2007  ? Atherosclerosis of native arteries of extremity with intermittent claudication ([place]) 02/08/2007  ? ?Past Medical History:  ?Diagnosis Date  ? Arthritis   ? Asthma   ? as a child  ? Chronic cystitis   ? Chronic diastolic heart failure ([place])   ? Chronic kidney disease   ? HD pt, 3 times a week.  ? Diabetes  mellitus   ? Dysrhythmia   ? GERD (gastroesophageal reflux disease)   ? H/O hiatal hernia   ? states it's been fixed  ? Hyperlipidemia   ? Hypertension   ? Lymphedema   ? Morbid obesity ([place])   ? NEPHROLITHIASIS, HX OF 12/02/2009  ? Qualifier: Diagnosis of  By: [name], Cat    ? PVD (peripheral vascular disease) ([place])   ? Renal insufficiency   ? UMBILICAL HERNIA 7/40/8144  ? Qualifier: History of  By: [name] MD, Audelia Acton    ? Venous insufficiency   ?  ?Family History  ?Problem Relation Age of Onset  ? Diabetes Mother   ? Heart disease Mother   ? Diabetes Brother   ?  ?Past Surgical History:  ?Procedure Laterality Date  ? ABDOMINAL AORTOGRAM W/LOWER EXTREMITY N/A 05/19/2017  ? Procedure: Abdominal Aortogram w/Lower Extremity;  Surgeon: [name], MD;  Location: [facility];  Service: Cardiovascular;  Laterality: N/A;  ? ABDOMINAL AORTOGRAM W/LOWER EXTREMITY N/A 06/18/2021  ? Procedure: ABDOMINAL AORTOGRAM W/LOWER EXTREMITY;  Surgeon: [name], MD;  Location: [facility];  Service: Cardiovascular;  Laterality: N/A;  ? AMPUTATION    ? Right and left fifth toes.   ? AMPUTATION Left 06/18/2021  ? Procedure: Amputation transmetatarsal of toes 2 3 and 4 left foot ;  Surgeon: [name], MD;  Location: [facility];  Service: Vascular;  Laterality: Left;  ? AMPUTATION TOE    ? emoval of both little toes  ?  AV FISTULA PLACEMENT Left 03/21/2014  ? Procedure: ARTERIOVENOUS (AV) FISTULA CREATION with ultrasound;  Surgeon: [name], MD;  Location: [facility];  Service: Vascular;  Laterality: Left;  ? COLONOSCOPY    ?

## 2022-04-04 ENCOUNTER — Ambulatory Visit (INDEPENDENT_AMBULATORY_CARE_PROVIDER_SITE_OTHER): Payer: Medicare Other | Admitting: Orthopedic Surgery

## 2022-04-04 DIAGNOSIS — L97411 Non-pressure chronic ulcer of right heel and midfoot limited to breakdown of skin: Secondary | ICD-10-CM | POA: Diagnosis not present

## 2022-04-17 ENCOUNTER — Encounter: Payer: Self-pay | Admitting: Orthopedic Surgery

## 2022-04-17 NOTE — Progress Notes (Signed)
? ?Office Visit Note ?  ?Patient: [name]           ?Date of Birth: 07/08/1951           ?MRN: [id number] ?Visit Date: 04/04/2022 ?             ?Requested by: [name], [name] ?[address] ?[place],  [place] [address] ?PCP: [name], [name] ? ?Chief Complaint  ?Patient presents with  ? Right Foot - Wound Check  ? ? ? ? ?HPI: ?Patient is a 71 year old gentleman who presents in follow-up for ulceration right heel.  Patient is currently doing dry dressing changes. ? ?Assessment & Plan: ?Visit Diagnoses:  ?1. Non-pressure chronic ulcer of right heel and midfoot limited to breakdown of skin ([place])   ? ? ?Plan: Continue with current wound care recommend continue compression stockings. ? ?Follow-Up Instructions: Return if symptoms worsen or fail to improve.  ? ?Ortho Exam ? ?Patient is alert, oriented, no adenopathy, well-dressed, normal affect, normal respiratory effort. ?Examination patient does have venous stasis swelling but no open ulcers.  The wound on the heel has completely resolved. ? ?Imaging: ?No results found. ?No images are attached to the encounter. ? ?Labs: ?Lab Results  ?Component Value Date  ? HGBA1C 4.9 06/15/2021  ? HGBA1C 6.2 (H) 07/11/2018  ? HGBA1C 6.8 03/13/2017  ? ESRSEDRATE 105 (H) 06/20/2021  ? ESRSEDRATE 112 (H) 06/17/2021  ? ESRSEDRATE 95 (H) 06/16/2021  ? CRP 10.9 (H) 06/20/2021  ? CRP 20.5 (H) 06/17/2021  ? CRP 26.3 (H) 06/16/2021  ? LABURIC 9.7 (H) 02/23/2015  ? REPTSTATUS 06/22/2021 FINAL 06/17/2021  ? GRAMSTAIN  06/24/2011  ?  RARE WBC PRESENT,BOTH PMN AND MONONUCLEAR ?NO ORGANISMS SEEN  ? GRAMSTAIN  06/24/2011  ?  RARE WBC PRESENT,BOTH PMN AND MONONUCLEAR ?NO ORGANISMS SEEN  ? CULT  06/17/2021  ?  NO GROWTH 5 DAYS ?Performed at [facility], [place] [address] [place], [place] [address] ?  ? LABORGA MORGANELLA MORGANII 08/21/2015  ? ? ? ?Lab Results  ?Component Value Date  ? ALBUMIN 2.3 (L) 06/24/2021  ? ALBUMIN 2.7 (L) 06/14/2021  ? ALBUMIN 2.7 (L) 09/12/2018  ? ? ?Lab  Results  ?Component Value Date  ? MG 1.9 06/15/2021  ? MG 2.0 04/13/2017  ? MG 1.9 03/22/2017  ? ?No results found for: VD25OH ? ?No results found for: PREALBUMIN ? ?  Latest Ref Rng & Units 07/07/2021  ? 12:17 AM 06/27/2021  ? 12:47 AM 06/26/2021  ?  1:06 AM  ?CBC EXTENDED  ?WBC 4.0 - 10.5 K/uL 10.4   14.3   13.8    ?RBC 4.22 - 5.81 MIL/uL 3.21   2.48   2.38    ?Hemoglobin 13.0 - 17.0 g/dL 9.4   7.4   7.2    ?HCT 39.0 - 52.0 % 30.5   23.5   22.4    ?Platelets 150 - 400 K/uL 219   283   273    ?NEUT# 1.7 - 7.7 K/uL 6.9      ?Lymph# 0.7 - 4.0 K/uL 2.0      ? ? ? ?There is no height or weight on file to calculate BMI. ? ?Orders:  ?No orders of the defined types were placed in this encounter. ? ?No orders of the defined types were placed in this encounter. ? ? ? Procedures: ?No procedures performed ? ?Clinical Data: ?No additional findings. ? ?ROS: ? ?All other systems negative, except as  noted in the HPI. ?Review of Systems ? ?Objective: ?Vital Signs: There were no vitals taken for this visit. ? ?Specialty Comments:  ?No specialty comments available. ? ?PMFS History: ?Patient Active Problem List  ? Diagnosis Date Noted  ? Persistent atrial fibrillation ([place]) 10/28/2021  ? Elevated troponin 10/28/2021  ? 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 Sepsis due to cellulitis ([place]) 06/14/2021  ? Cellulitis of foot, left   ? Hypercalcemia 06/02/2020  ? Other disorders of phosphorus metabolism 01/04/2019  ? Venous stasis   ? History of endocarditis   ? Bradycardia   ? Orthostasis   ? Chest pain 09/10/2018  ? Hypovolemia associated with hemodialysis 07/11/2018  ? Weakness   ? Thrombocytopenia ([place]) 07/10/2018  ? Moderate protein-calorie malnutrition ([place]) 05/12/2017  ? Balanitis 04/25/2017  ? Penile swelling   ? Scrotal swelling   ? UTI (urinary tract infection) 04/13/2017  ? Anemia of chronic disease   ? Acute encephalopathy 04/12/2017  ? Urinary  retention 04/10/2017  ? Gram-negative sepsis, unspecified ([place]) 03/29/2017  ? Bacteremia   ? RUQ abdominal pain   ? Gross hematuria   ? Other pancytopenia ([place])   ? Sepsis ([place]) 03/21/2017  ? Diarrhea 03/03/2017  ? Hyperkalemia 01/24/2017  ? Retained lens material following cataract surgery of both eyes 11/08/2016  ? Venous stasis ulcers of both lower extremities ([place]) 08/07/2015  ? Varicose veins of lower extremities with complications 94/17/4081  ? Malfunction of arteriovenous dialysis fistula (HCC)   ? CHF (congestive heart failure) ([place]) 06/23/2015  ? Headache, unspecified 06/23/2015  ? Iron deficiency anemia, unspecified 06/23/2015  ? Other specified coagulation defects ([place]) 06/23/2015  ? Pain, unspecified 06/23/2015  ? Pruritus, unspecified 06/23/2015  ? Secondary hyperparathyroidism of renal origin ([place]) 06/23/2015  ? Type 2 diabetes mellitus with diabetic peripheral angiopathy without gangrene ([place]) 06/23/2015  ? Lymphedema distichiasis syndrome with kidney disease and diabetes mellitus ([place]) 04/06/2015  ? Lymphedema of lower extremity 04/04/2015  ? Type II diabetes mellitus with nephropathy ([place]) 04/04/2015  ? MGUS (monoclonal gammopathy of unknown significance) 06/12/2014  ? Chronic diastolic heart failure ([place]) 10/30/2013  ? Special screening for malignant neoplasms, colon 07/09/2013  ? Proliferative diabetic retinopathy ([place]) 10/23/2012  ? Knee pain 08/21/2012  ? Mass of thigh 08/21/2012  ? FEVER UNSPECIFIED 05/06/2010  ? ORGANIC IMPOTENCE 12/02/2009  ? ONYCHOMYCOSIS 07/17/2009  ? Lymphedema 05/06/2009  ? ESRD on dialysis ([place]) 06/09/2008  ? Hyperlipidemia 03/19/2007  ? Class 2 severe obesity due to excess calories with serious comorbidity and body mass index (BMI) of 35.0 to 35.9 in adult [facility]) 02/08/2007  ? Essential hypertension 02/08/2007  ? Atherosclerosis of native arteries of extremity with intermittent claudication ([place]) 02/08/2007  ? ?Past Medical History:  ?Diagnosis Date  ? Arthritis   ?  Asthma   ? as a child  ? Chronic cystitis   ? Chronic diastolic heart failure ([place])   ? Chronic kidney disease   ? HD pt, 3 times a week.  ? Diabetes mellitus   ? Dysrhythmia   ? GERD (gastroesophageal reflux disease)   ? H/O hiatal hernia   ? states it's been fixed  ? Hyperlipidemia   ? Hypertension   ? Lymphedema   ? Morbid obesity ([place])   ? NEPHROLITHIASIS, HX OF 12/02/2009  ? Qualifier: Diagnosis of  By: [name], Cat    ? PVD (peripheral vascular disease) ([place])   ? Renal insufficiency   ?  UMBILICAL HERNIA 5/62/5638  ? Qualifier: History of  By: [name] MD, Audelia Acton    ? Venous insufficiency   ?  ?Family History  ?Problem Relation Age of Onset  ? Diabetes Mother   ? Heart disease Mother   ? Diabetes Brother   ?  ?Past Surgical History:  ?Procedure Laterality Date  ? ABDOMINAL AORTOGRAM W/LOWER EXTREMITY N/A 05/19/2017  ? Procedure: Abdominal Aortogram w/Lower Extremity;  Surgeon: [name], MD;  Location: [facility];  Service: Cardiovascular;  Laterality: N/A;  ? ABDOMINAL AORTOGRAM W/LOWER EXTREMITY N/A 06/18/2021  ? Procedure: ABDOMINAL AORTOGRAM W/LOWER EXTREMITY;  Surgeon: [name], MD;  Location: [facility];  Service: Cardiovascular;  Laterality: N/A;  ? AMPUTATION    ? Right and left fifth toes.   ? AMPUTATION Left 06/18/2021  ? Procedure: Amputation transmetatarsal of toes 2 3 and 4 left foot ;  Surgeon: [name], MD;  [facility];  Service: Vascular;  Laterality: Left;  ? AMPUTATION TOE    ? emoval of both little toes  ? AV FISTULA PLACEMENT Left 03/21/2014  ? Procedure: ARTERIOVENOUS (AV) FISTULA CREATION with ultrasound;  Surgeon: [name], MD;  Location: [facility];  Service: Vascular;  Laterality: Left;  ? COLONOSCOPY    ? EYE SURGERY Bilateral   ? cataract and lens implant  ? FEMORAL-TIBIAL BYPASS GRAFT Left 06/18/2021  ? Procedure: Left below-knee popliteal to posterior tibial artery bypass with 9 reversed left greater saphenous vein;  Surgeon: [name], MD;   Location: [facility];  Service: Vascular;  Laterality: Left;  ? HERNIA REPAIR    ? LIGATION OF COMPETING BRANCHES OF ARTERIOVENOUS FISTULA Left 06/29/2015  ? Procedure: LIGATION OF LEFT ARM RADIOCEPHALIC ARTERIOVE

## 2022-04-25 ENCOUNTER — Other Ambulatory Visit (HOSPITAL_COMMUNITY): Payer: Self-pay | Admitting: Nurse Practitioner

## 2022-04-25 ENCOUNTER — Other Ambulatory Visit: Payer: Self-pay | Admitting: Nurse Practitioner

## 2022-06-30 NOTE — Progress Notes (Deleted)
Office Note     CC:  *** Requesting Provider:  [name], [name]  HPI: [name] is a 71 y.o. (1951/10/01) male presenting at the request of .[name], [name] ***  The pt is *** on a statin for cholesterol management.  The pt is *** on a daily aspirin.   Other AC:  *** The pt is *** on medication for hypertension.   The pt is *** diabetic.  Tobacco hx:  ***  Past Medical History:  Diagnosis Date   Arthritis    Asthma    as a child   Chronic cystitis    Chronic diastolic heart failure ([place])    Chronic kidney disease    HD pt, 3 times a week.   Diabetes mellitus    Dysrhythmia    GERD (gastroesophageal reflux disease)    H/O hiatal hernia    states it's been fixed   Hyperlipidemia    Hypertension    Lymphedema    Morbid obesity ([place])    NEPHROLITHIASIS, HX OF 12/02/2009   Qualifier: Diagnosis of  By: [name], Cat     PVD (peripheral vascular disease) ([place])    Renal insufficiency    UMBILICAL HERNIA 0/34/7425   Qualifier: History of  By: [name] MD, Shane     Venous insufficiency     Past Surgical History:  Procedure Laterality Date   ABDOMINAL AORTOGRAM W/LOWER EXTREMITY N/A 05/19/2017   Procedure: Abdominal Aortogram w/Lower Extremity;  Surgeon: [name], MD;  Location: [facility];  Service: Cardiovascular;  Laterality: N/A;   ABDOMINAL AORTOGRAM W/LOWER EXTREMITY N/A 06/18/2021   Procedure: ABDOMINAL AORTOGRAM W/LOWER EXTREMITY;  Surgeon: [name], MD;  Location: [facility];  Service: Cardiovascular;  Laterality: N/A;   AMPUTATION     Right and left fifth toes.    AMPUTATION Left 06/18/2021   Procedure: Amputation transmetatarsal of toes 2 3 and 4 left foot ;  Surgeon: [name], MD;  Location: [facility] OR;  Service: Vascular;  Laterality: Left;   AMPUTATION TOE     emoval of both little toes   AV FISTULA PLACEMENT Left 03/21/2014   Procedure: ARTERIOVENOUS (AV) FISTULA CREATION with ultrasound;  Surgeon: [name], MD;  Location: [facility] OR;  Service: Vascular;  Laterality: Left;   COLONOSCOPY     EYE SURGERY Bilateral    cataract and lens implant   FEMORAL-TIBIAL BYPASS GRAFT Left 06/18/2021   Procedure: Left below-knee popliteal to posterior tibial artery bypass with 9 reversed left greater saphenous vein;  Surgeon: [name], MD;  Location: [facility] OR;  Service: Vascular;  Laterality: Left;   HERNIA REPAIR     LIGATION OF COMPETING BRANCHES OF ARTERIOVENOUS FISTULA Left 06/29/2015   Procedure: LIGATION OF LEFT ARM RADIOCEPHALIC ARTERIOVENOUS FISTULA SIDE BRANCHES;  Surgeon: Conrad Bellefonte, MD;  Location: [place];  Service: Vascular;  Laterality: Left;   MULTIPLE EXTRACTIONS WITH ALVEOLOPLASTY N/A 04/07/2017   Procedure: Extraction of tooth #'s 1-11, 13, 14,16, 20-23, and 26-28 with alveoloplasty;  Surgeon: Lenn Cal, DDS;  Location: [place];  Service: Oral Surgery;  Laterality: N/A;   Popliteal to posterior tibial bypass     2006   R knee arthoscopic repair of meniscus     UMBILICAL HERNIA REPAIR      Social History   Socioeconomic History   Marital status: Legally Separated    Spouse name: Not on file   Number of  children: 1   Years of education: Not on file   Highest education level: Not on file  Occupational History    Employer: DISABLED   Tobacco Use   Smoking status: Former    Types: Cigarettes    Quit date: 06/06/1979    Years since quitting: 43.0   Smokeless tobacco: Former  Scientific laboratory technician Use: Never used  Substance and Sexual Activity   Alcohol use: No    Alcohol/week: 0.0 standard drinks of alcohol    Comment:   "years ago" , none now   Drug use: No   Sexual activity: Never  Other Topics Concern   Not on file  Social History Narrative   Lives alone.  Only son is deceased.    Social Determinants of Health   Financial Resource Strain: Not on file  Food Insecurity: Not on file  Transportation Needs: Not on file  Physical Activity: Not on file  Stress: Not on  file  Social Connections: Not on file  Intimate Partner Violence: Not on file   *** Family History  Problem Relation Age of Onset   Diabetes Mother    Heart disease Mother    Diabetes Brother     Current Outpatient Medications  Medication Sig Dispense Refill   acetaminophen (TYLENOL) 325 MG tablet Take 2 tablets (650 mg total) by mouth every 6 (six) hours as needed for mild pain (or Fever >/= 101).     Amino Acids-Protein Hydrolys (FEEDING SUPPLEMENT, PRO-STAT 64,) LIQD Take 30 mLs by mouth 3 (three) times daily with meals.     amLODipine (NORVASC) 10 MG tablet Take 10 mg by mouth daily.     apixaban (ELIQUIS) 5 MG TABS tablet Take 1 tablet (5 mg total) by mouth 2 (two) times daily. 60 tablet    atorvastatin (LIPITOR) 80 MG tablet Take 80 mg by mouth daily.     B Complex-C-Folic Acid (DIALYVITE 962) 0.8 MG TABS Take 1 tablet by mouth daily.     Darbepoetin Alfa (ARANESP) 100 MCG/0.5ML SOSY injection Inject 100 mcg into the skin.     docusate sodium (COLACE) 100 MG capsule Take 100 mg by mouth 2 (two) times daily.     glucagon (GLUCAGEN) 1 MG SOLR injection Inject 1 mg into the vein once as needed for low blood sugar.     levothyroxine (SYNTHROID) 50 MCG tablet Take 50 mcg by mouth daily before breakfast.     metoprolol succinate (TOPROL-XL) 25 MG 24 hr tablet Take 1 tablet (25 mg total) by mouth daily.     ondansetron (ZOFRAN) 4 MG tablet Take 1 tablet (4 mg total) by mouth every 6 (six) hours as needed for nausea. 20 tablet 0   pantoprazole (PROTONIX) 40 MG tablet Take 1 tablet (40 mg total) by mouth daily.     sucroferric oxyhydroxide (VELPHORO) 500 MG chewable tablet Chew 1 tablet (500 mg total) by mouth 3 (three) times daily with meals. 90 tablet    vitamin C (ASCORBIC ACID) 500 MG tablet Take 500 mg by mouth daily.     zinc sulfate 220 (50 Zn) MG capsule Take 220 mg by mouth daily.     No current facility-administered medications for this visit.    Allergies  Allergen  Reactions   Other Swelling and Itching   Shrimp [Shellfish Allergy] Swelling     REVIEW OF SYSTEMS:  *** '[X]'$  denotes positive finding, '[ ]'$  denotes negative finding Cardiac  Comments:  Chest pain or chest pressure:  Shortness of breath upon exertion:    Short of breath when lying flat:    Irregular heart rhythm:        Vascular    Pain in calf, thigh, or hip brought on by ambulation:    Pain in feet at night that wakes you up from your sleep:     Blood clot in your veins:    Leg swelling:         Pulmonary    Oxygen at home:    Productive cough:     Wheezing:         Neurologic    Sudden weakness in arms or legs:     Sudden numbness in arms or legs:     Sudden onset of difficulty speaking or slurred speech:    Temporary loss of vision in one eye:     Problems with dizziness:         Gastrointestinal    Blood in stool:     Vomited blood:         Genitourinary    Burning when urinating:     Blood in urine:        Psychiatric    Major depression:         Hematologic    Bleeding problems:    Problems with blood clotting too easily:        Skin    Rashes or ulcers:        Constitutional    Fever or chills:      PHYSICAL EXAMINATION:  There were no vitals filed for this visit.  General:  WDWN in NAD; vital signs documented above Gait: Not observed HENT: WNL, normocephalic Pulmonary: normal non-labored breathing , without wheezing Cardiac: {Desc; regular/irreg:14544} HR, bruit*** Abdomen: soft, NT, no masses Skin: {With/Without:20273} rashes Vascular Exam/Pulses:  Right Left  Radial {Exam; arterial pulse strength 0-4:30167} {Exam; arterial pulse strength 0-4:30167}  Ulnar {Exam; arterial pulse strength 0-4:30167} {Exam; arterial pulse strength 0-4:30167}  Femoral {Exam; arterial pulse strength 0-4:30167} {Exam; arterial pulse strength 0-4:30167}  Popliteal {Exam; arterial pulse strength 0-4:30167} {Exam; arterial pulse strength 0-4:30167}  DP {Exam;  arterial pulse strength 0-4:30167} {Exam; arterial pulse strength 0-4:30167}  PT {Exam; arterial pulse strength 0-4:30167} {Exam; arterial pulse strength 0-4:30167}   Extremities: {With/Without:20273} ischemic changes, {With/Without:20273} Gangrene , {With/Without:20273} cellulitis; {With/Without:20273} open wounds;  Musculoskeletal: no muscle wasting or atrophy  Neurologic: A&O X 3;  No focal weakness or paresthesias are detected Psychiatric:  The pt has {Desc; normal/abnormal:11317::"Normal"} affect.   Non-Invasive Vascular Imaging:   ***    ASSESSMENT/PLAN: WORTH KOBER is a 71 y.o. male presenting with ***   ***   Broadus John, MD [facility] [phone]

## 2022-07-01 ENCOUNTER — Ambulatory Visit: Payer: Medicare Other | Admitting: Vascular Surgery

## 2022-07-01 ENCOUNTER — Encounter (HOSPITAL_COMMUNITY): Payer: Medicare Other

## 2022-07-06 ENCOUNTER — Other Ambulatory Visit: Payer: Self-pay | Admitting: *Deleted

## 2022-07-06 DIAGNOSIS — I739 Peripheral vascular disease, unspecified: Secondary | ICD-10-CM

## 2022-07-29 ENCOUNTER — Ambulatory Visit (INDEPENDENT_AMBULATORY_CARE_PROVIDER_SITE_OTHER): Payer: Medicare Other | Admitting: Physician Assistant

## 2022-07-29 ENCOUNTER — Ambulatory Visit (HOSPITAL_COMMUNITY)
Admission: RE | Admit: 2022-07-29 | Discharge: 2022-07-29 | Disposition: A | Discharge status: Home / Self Care | Payer: Medicare Other | Source: Ambulatory Visit | Attending: Vascular Surgery | Admitting: Vascular Surgery

## 2022-07-29 VITALS — BP 175/75 | HR 57 | Temp 97.1°F | Ht 71.0 in | Wt 209.3 lb

## 2022-07-29 DIAGNOSIS — I739 Peripheral vascular disease, unspecified: Secondary | ICD-10-CM

## 2022-07-29 NOTE — Progress Notes (Signed)
Office Note     CC:  follow up Requesting Provider:  [name], [name]  HPI: [name] is a 71 y.o. ([birth year]) male who presents for evaluation of PAD.  He has known history of right femoral to PT bypass of the right leg performed by Dr. Oneida Alar in 2006.  He also has left popliteal to PTA bypass with vein and transmetatarsal amputation of the left foot by Dr. Oneida Alar on 06/18/2021.  This year he has had a right heel ulceration which has been followed by Dr. Sharol Given.  He presents today and states the right heel ulcer has completely healed.  He denies any claudication, rest pain, or further tissue changes.  He does also have lymphedema and elevates his legs regularly.  He is a former smoker.  He is on Eliquis for atrial fibrillation.   Past Medical History:  Diagnosis Date   Arthritis    Asthma    as a child   Chronic cystitis    Chronic diastolic heart failure ([place])    Chronic kidney disease    HD pt, 3 times a week.   Diabetes mellitus    Dysrhythmia    GERD (gastroesophageal reflux disease)    H/O hiatal hernia    states it's been fixed   Hyperlipidemia    Hypertension    Lymphedema    Morbid obesity ([place])    NEPHROLITHIASIS, HX OF 12/02/2009   Qualifier: Diagnosis of  By: [name], Cat     PVD (peripheral vascular disease) ([place])    Renal insufficiency    UMBILICAL HERNIA 8/84/1660   Qualifier: History of  By: [name] MD, Shane     Venous insufficiency     Past Surgical History:  Procedure Laterality Date   ABDOMINAL AORTOGRAM W/LOWER EXTREMITY N/A 05/19/2017   Procedure: Abdominal Aortogram w/Lower Extremity;  Surgeon: [name], MD;  Location: [facility];  Service: Cardiovascular;  Laterality: N/A;   ABDOMINAL AORTOGRAM W/LOWER EXTREMITY N/A 06/18/2021   Procedure: ABDOMINAL AORTOGRAM W/LOWER EXTREMITY;  Surgeon: [name], MD;  Location: [facility];  Service: Cardiovascular;  Laterality: N/A;   AMPUTATION     Right and left fifth toes.     AMPUTATION Left 06/18/2021   Procedure: Amputation transmetatarsal of toes 2 3 and 4 left foot ;  Surgeon: [name], MD;  Location: [facility] OR;  Service: Vascular;  Laterality: Left;   AMPUTATION TOE     emoval of both little toes   AV FISTULA PLACEMENT Left 03/21/2014   Procedure: ARTERIOVENOUS (AV) FISTULA CREATION with ultrasound;  Surgeon: [name], MD;  Location: [facility] OR;  Service: Vascular;  Laterality: Left;   COLONOSCOPY     EYE SURGERY Bilateral    cataract and lens implant   FEMORAL-TIBIAL BYPASS GRAFT Left 06/18/2021   Procedure: Left below-knee popliteal to posterior tibial artery bypass with 9 reversed left greater saphenous vein;  Surgeon: [name], MD;  Location: [facility] OR;  Service: Vascular;  Laterality: Left;   HERNIA REPAIR     LIGATION OF COMPETING BRANCHES OF ARTERIOVENOUS FISTULA Left 06/29/2015   Procedure: LIGATION OF LEFT ARM RADIOCEPHALIC ARTERIOVENOUS FISTULA SIDE BRANCHES;  Surgeon: Conrad Victor, MD;  Location: [place];  Service: Vascular;  Laterality: Left;   MULTIPLE EXTRACTIONS WITH ALVEOLOPLASTY N/A 04/07/2017   Procedure: Extraction of tooth #'s 1-11, 13, 14,16, 20-23, and 26-28 with alveoloplasty;  Surgeon: Lenn Cal, DDS;  Location: [place];  Service:  Oral Surgery;  Laterality: N/A;   Popliteal to posterior tibial bypass     2006   R knee arthoscopic repair of meniscus     UMBILICAL HERNIA REPAIR      Social History   Socioeconomic History   Marital status: Legally Separated    Spouse name: Not on file   Number of children: 1   Years of education: Not on file   Highest education level: Not on file  Occupational History    Employer: DISABLED   Tobacco Use   Smoking status: Former    Types: Cigarettes    Quit date: 06/06/1979    Years since quitting: 43.1   Smokeless tobacco: Former  Scientific laboratory technician Use: Never used  Substance and Sexual Activity   Alcohol use: No    Alcohol/week: 0.0 standard drinks of alcohol    Comment:    "years ago" , none now   Drug use: No   Sexual activity: Never  Other Topics Concern   Not on file  Social History Narrative    Lives in  group home.  Only son is deceased.    Social Determinants of Health   Financial Resource Strain: Not on file  Food Insecurity: Not on file  Transportation Needs: Not on file  Physical Activity: Not on file  Stress: Not on file  Social Connections: Not on file  Intimate Partner Violence: Not on file    Family History  Problem Relation Age of Onset   Diabetes Mother    Heart disease Mother    Diabetes Brother     Current Outpatient Medications  Medication Sig Dispense Refill   acetaminophen (TYLENOL) 325 MG tablet Take 2 tablets (650 mg total) by mouth every 6 (six) hours as needed for mild pain (or Fever >/= 101).     amLODipine (NORVASC) 10 MG tablet Take 10 mg by mouth daily.     apixaban (ELIQUIS) 5 MG TABS tablet Take 1 tablet (5 mg total) by mouth 2 (two) times daily. 60 tablet    atorvastatin (LIPITOR) 80 MG tablet Take 80 mg by mouth daily.     sucroferric oxyhydroxide (VELPHORO) 500 MG chewable tablet Chew 1 tablet (500 mg total) by mouth 3 (three) times daily with meals. 90 tablet    vitamin C (ASCORBIC ACID) 500 MG tablet Take 500 mg by mouth daily.     Amino Acids-Protein Hydrolys (FEEDING SUPPLEMENT, PRO-STAT 64,) LIQD Take 30 mLs by mouth 3 (three) times daily with meals.     B Complex-C-Folic Acid (DIALYVITE 784) 0.8 MG TABS Take 1 tablet by mouth daily. (Patient not taking: Reported on 07/29/2022)     Darbepoetin Alfa (ARANESP) 100 MCG/0.5ML SOSY injection Inject 100 mcg into the skin. (Patient not taking: Reported on 07/29/2022)     docusate sodium (COLACE) 100 MG capsule Take 100 mg by mouth 2 (two) times daily. (Patient not taking: Reported on 07/29/2022)     glucagon (GLUCAGEN) 1 MG SOLR injection Inject 1 mg into the vein once as needed for low blood sugar. (Patient not taking: Reported on 07/29/2022)     levothyroxine  (SYNTHROID) 50 MCG tablet Take 50 mcg by mouth daily before breakfast.     metoprolol succinate (TOPROL-XL) 25 MG 24 hr tablet Take 1 tablet (25 mg total) by mouth daily.     ondansetron (ZOFRAN) 4 MG tablet Take 1 tablet (4 mg total) by mouth every 6 (six) hours as needed for nausea. (Patient not taking: Reported  on 07/29/2022) 20 tablet 0   pantoprazole (PROTONIX) 40 MG tablet Take 1 tablet (40 mg total) by mouth daily. (Patient not taking: Reported on 07/29/2022)     zinc sulfate 220 (50 Zn) MG capsule Take 220 mg by mouth daily. (Patient not taking: Reported on 07/29/2022)     No current facility-administered medications for this visit.    Allergies  Allergen Reactions   Other Swelling and Itching   Shrimp [Shellfish Allergy] Swelling     REVIEW OF SYSTEMS:   '[X]'$  denotes positive finding, '[ ]'$  denotes negative finding Cardiac  Comments:  Chest pain or chest pressure:    Shortness of breath upon exertion:    Short of breath when lying flat:    Irregular heart rhythm:        Vascular    Pain in calf, thigh, or hip brought on by ambulation:    Pain in feet at night that wakes you up from your sleep:     Blood clot in your veins:    Leg swelling:         Pulmonary    Oxygen at home:    Productive cough:     Wheezing:         Neurologic    Sudden weakness in arms or legs:     Sudden numbness in arms or legs:     Sudden onset of difficulty speaking or slurred speech:    Temporary loss of vision in one eye:     Problems with dizziness:         Gastrointestinal    Blood in stool:     Vomited blood:         Genitourinary    Burning when urinating:     Blood in urine:        Psychiatric    Major depression:         Hematologic    Bleeding problems:    Problems with blood clotting too easily:        Skin    Rashes or ulcers:        Constitutional    Fever or chills:      PHYSICAL EXAMINATION:  Vitals:   07/29/22 0935  BP: (!) 175/75  Pulse: (!) 57  Temp:  (!) 97.1 F (36.2 C)  TempSrc: Temporal  SpO2: 100%  Weight: 209 lb 4.8 oz (94.9 kg)  Height: '5\' 11"'$  (1.803 m)    General:  WDWN in NAD; vital signs documented above Gait: Not observed HENT: WNL, normocephalic Pulmonary: normal non-labored breathing , without Rales, rhonchi,  wheezing Cardiac: regular HR Abdomen: soft, NT, no masses Skin: without rashes Vascular Exam/Pulses:  Right Left  DP absent absent  PT absent absent   Extremities: without ischemic changes, without Gangrene , without cellulitis; without open wounds; superficial callus over right heel however no ulceration; lymphedema affecting bilateral lower extremities with dense pigment changes of bilateral lower extremities Musculoskeletal: no muscle wasting or atrophy  Neurologic: A&O X 3;  No focal weakness or paresthesias are detected Psychiatric:  The pt has Normal affect.   Non-Invasive Vascular Imaging:   ABI/TBIToday's ABIToday's TBIPrevious ABIPrevious TBI  +-------+-----------+-----------+------------+------------+  Right  1.08       0.31       0.92        0.27          +-------+-----------+-----------+------------+------------+  Left   [place]         0.91  [place]          0.42             ASSESSMENT/PLAN:: 71 y.o. male here for follow up for surveillance of PAD   -Since last office visit patient developed a right heel ulceration however with wound care involving Dr. Sharol Given has completely healed it by today's visit.  He has history of bypasses in both legs both performed by Dr. Oneida Alar most recently left leg bypass was performed in July 2022.  He does not have rest pain or further tissue changes.  He only received an ABI today and has not had bypass surveillance recently.  We will bring him back in 6 months to check bilateral lower extremity bypass duplexes.  He will return office sooner if he develops any new tissue changes or rest pain.   Dagoberto Ligas, PA-C [facility] [phone]  Clinic MD:   Virl Cagey

## 2022-08-08 ENCOUNTER — Other Ambulatory Visit: Payer: Self-pay

## 2022-08-08 DIAGNOSIS — I739 Peripheral vascular disease, unspecified: Secondary | ICD-10-CM

## 2023-01-19 ENCOUNTER — Encounter (HOSPITAL_COMMUNITY): Payer: Self-pay

## 2023-01-19 ENCOUNTER — Emergency Department (HOSPITAL_COMMUNITY)
Admission: EM | Admit: 2023-01-19 | Discharge: 2023-01-19 | Disposition: A | Discharge status: Home / Self Care | Payer: 59 | Attending: Emergency Medicine | Admitting: Emergency Medicine

## 2023-01-19 ENCOUNTER — Emergency Department (HOSPITAL_COMMUNITY): Payer: 59

## 2023-01-19 ENCOUNTER — Other Ambulatory Visit: Payer: Self-pay

## 2023-01-19 DIAGNOSIS — Z992 Dependence on renal dialysis: Secondary | ICD-10-CM | POA: Insufficient documentation

## 2023-01-19 DIAGNOSIS — I132 Hypertensive heart and chronic kidney disease with heart failure and with stage 5 chronic kidney disease, or end stage renal disease: Secondary | ICD-10-CM | POA: Diagnosis not present

## 2023-01-19 DIAGNOSIS — Z7901 Long term (current) use of anticoagulants: Secondary | ICD-10-CM | POA: Diagnosis not present

## 2023-01-19 DIAGNOSIS — Z79899 Other long term (current) drug therapy: Secondary | ICD-10-CM | POA: Insufficient documentation

## 2023-01-19 DIAGNOSIS — R1031 Right lower quadrant pain: Secondary | ICD-10-CM | POA: Insufficient documentation

## 2023-01-19 DIAGNOSIS — E1122 Type 2 diabetes mellitus with diabetic chronic kidney disease: Secondary | ICD-10-CM | POA: Diagnosis not present

## 2023-01-19 DIAGNOSIS — I5030 Unspecified diastolic (congestive) heart failure: Secondary | ICD-10-CM | POA: Diagnosis not present

## 2023-01-19 DIAGNOSIS — N186 End stage renal disease: Secondary | ICD-10-CM | POA: Diagnosis not present

## 2023-01-19 LAB — CBC WITH DIFFERENTIAL/PLATELET
Abs Immature Granulocytes: 0.04 10*3/uL (ref 0.00–0.07)
Basophils Absolute: 0 10*3/uL (ref 0.0–0.1)
Basophils Relative: 0 %
Eosinophils Absolute: 0.5 10*3/uL (ref 0.0–0.5)
Eosinophils Relative: 5 %
HCT: 32.1 % — ABNORMAL LOW (ref 39.0–52.0)
Hemoglobin: 10.3 g/dL — ABNORMAL LOW (ref 13.0–17.0)
Immature Granulocytes: 0 %
Lymphocytes Relative: 14 %
Lymphs Abs: 1.4 10*3/uL (ref 0.7–4.0)
MCH: 29.9 pg (ref 26.0–34.0)
MCHC: 32.1 g/dL (ref 30.0–36.0)
MCV: 93 fL (ref 80.0–100.0)
Monocytes Absolute: 0.7 10*3/uL (ref 0.1–1.0)
Monocytes Relative: 7 %
Neutro Abs: 7.6 10*3/uL (ref 1.7–7.7)
Neutrophils Relative %: 74 %
Platelets: 211 10*3/uL (ref 150–400)
RBC: 3.45 MIL/uL — ABNORMAL LOW (ref 4.22–5.81)
RDW: 15.5 % (ref 11.5–15.5)
WBC: 10.2 10*3/uL (ref 4.0–10.5)
nRBC: 0 % (ref 0.0–0.2)

## 2023-01-19 LAB — COMPREHENSIVE METABOLIC PANEL
ALT: 17 U/L (ref 0–44)
AST: 15 U/L (ref 15–41)
Albumin: 3.6 g/dL (ref 3.5–5.0)
Alkaline Phosphatase: 42 U/L (ref 38–126)
Anion gap: 13 (ref 5–15)
BUN: 29 mg/dL — ABNORMAL HIGH (ref 8–23)
CO2: 31 mmol/L (ref 22–32)
Calcium: 8 mg/dL — ABNORMAL LOW (ref 8.9–10.3)
Chloride: 93 mmol/L — ABNORMAL LOW (ref 98–111)
Creatinine, Ser: 9.27 mg/dL — ABNORMAL HIGH (ref 0.61–1.24)
GFR, Estimated: 5 mL/min — ABNORMAL LOW (ref >60)
Glucose, Bld: 98 mg/dL (ref 70–99)
Potassium: 3.6 mmol/L (ref 3.5–5.1)
Sodium: 137 mmol/L (ref 135–145)
Total Bilirubin: 0.6 mg/dL (ref 0.3–1.2)
Total Protein: 7.8 g/dL (ref 6.5–8.1)

## 2023-01-19 LAB — TROPONIN I (HIGH SENSITIVITY)
Troponin I (High Sensitivity): 36 ng/L — ABNORMAL HIGH (ref <18)
Troponin I (High Sensitivity): 35 ng/L — ABNORMAL HIGH (ref <18)

## 2023-01-19 LAB — LIPASE, BLOOD: Lipase: 37 U/L (ref 11–51)

## 2023-01-19 MED ORDER — IOHEXOL 300 MG/ML  SOLN
80.0000 mL | Freq: Once | INTRAMUSCULAR | Status: AC | PRN
  Administered 2023-01-19: 80 mL via INTRAVENOUS

## 2023-01-19 NOTE — ED Provider Notes (Signed)
[place] EMERGENCY DEPARTMENT AT [facility] Provider Note   CSN: [id number] Arrival date & time: 01/19/23  1226     History  Chief Complaint  Patient presents with   Abdominal Pain    [name] is a 72 y.o. male.  Patient with a history of diabetes, hypertension, ESRD on dialysis Tuesday, Thursday, Saturday, peripheral vascular disease status post foot amputations, GERD, diastolic CHF presenting with 2 weeks of progressively worsening right-sided lower abdominal pain.  Reports the pain is constant and waxes and wanes in severity.  Nothing makes it better or worse.  He did miss dialysis 2 days ago due to this pain but went today.  Denies any nausea, vomiting, diarrhea or constipation.  Does not believe he is constipated and is having normal bowel movements.  Makes very little urine.  Still has appendix and gallbladder.  Denies chest pain or shortness of breath.  Denies fever.  Has not tried anything for this pain  The history is provided by the patient and the EMS personnel.  Abdominal Pain Associated symptoms: no chest pain, no constipation, no cough, no diarrhea, no dysuria, no fever, no hematuria, no nausea, no shortness of breath and no vomiting        Home Medications Prior to Admission medications   Medication Sig Start Date End Date Taking? Authorizing Provider  acetaminophen (TYLENOL) 325 MG tablet Take 2 tablets (650 mg total) by mouth every 6 (six) hours as needed for mild pain (or Fever >/= 101). 06/27/21   Ezekiel Slocumb, DO  Amino Acids-Protein Hydrolys (FEEDING SUPPLEMENT, PRO-STAT 64,) LIQD Take 30 mLs by mouth 3 (three) times daily with meals.    [provider]  amLODipine (NORVASC) 10 MG tablet Take 10 mg by mouth daily. 02/03/17   [provider]  apixaban (ELIQUIS) 5 MG TABS tablet Take 1 tablet (5 mg total) by mouth 2 (two) times daily. 06/27/21   Ezekiel Slocumb, DO  atorvastatin (LIPITOR) 80 MG tablet Take 80 mg by mouth  daily.    [provider]  B Complex-C-Folic Acid (DIALYVITE 498) 0.8 MG TABS Take 1 tablet by mouth daily. Patient not taking: Reported on 07/29/2022 10/26/21   [provider]  Darbepoetin Alfa (ARANESP) 100 MCG/0.5ML SOSY injection Inject 100 mcg into the skin. Patient not taking: Reported on 07/29/2022    [provider]  docusate sodium (COLACE) 100 MG capsule Take 100 mg by mouth 2 (two) times daily. Patient not taking: Reported on 07/29/2022    [provider]  glucagon (GLUCAGEN) 1 MG SOLR injection Inject 1 mg into the vein once as needed for low blood sugar. Patient not taking: Reported on 07/29/2022    [provider]  levothyroxine (SYNTHROID) 50 MCG tablet Take 50 mcg by mouth daily before breakfast.    [provider]  metoprolol succinate (TOPROL-XL) 25 MG 24 hr tablet Take 1 tablet (25 mg total) by mouth daily. 06/28/21   Nicole Kindred A, DO  ondansetron (ZOFRAN) 4 MG tablet Take 1 tablet (4 mg total) by mouth every 6 (six) hours as needed for nausea. Patient not taking: Reported on 07/29/2022 06/27/21   Nicole Kindred A, DO  pantoprazole (PROTONIX) 40 MG tablet Take 1 tablet (40 mg total) by mouth daily. Patient not taking: Reported on 07/29/2022 06/28/21   Ezekiel Slocumb, DO  sucroferric oxyhydroxide (VELPHORO) 500 MG chewable tablet Chew 1 tablet (500 mg total) by mouth 3 (three) times daily with meals. 06/27/21  Nicole Kindred A, DO  vitamin C (ASCORBIC ACID) 500 MG tablet Take 500 mg by mouth daily.    [provider]  zinc sulfate 220 (50 Zn) MG capsule Take 220 mg by mouth daily. Patient not taking: Reported on 07/29/2022    [provider]      Allergies    Other and Shrimp [shellfish allergy]    Review of Systems   Review of Systems  Constitutional:  Negative for activity change, appetite change and fever.  HENT:  Negative for congestion and rhinorrhea.   Respiratory:  Negative for cough, chest  tightness and shortness of breath.   Cardiovascular:  Negative for chest pain.  Gastrointestinal:  Positive for abdominal pain. Negative for constipation, diarrhea, nausea and vomiting.  Genitourinary:  Negative for dysuria and hematuria.  Musculoskeletal:  Negative for arthralgias and myalgias.  Skin:  Negative for rash.  Neurological:  Negative for dizziness, weakness and headaches.    all other systems are negative except as noted in the HPI and PMH.   Physical Exam Updated Vital Signs BP (!) 182/98   Pulse 74   Temp 98.1 F (36.7 C)   Resp 18   Ht '5\' 11"'$  (1.803 m)   Wt 95 kg   SpO2 100%   BMI 29.21 kg/m  Physical Exam Vitals and nursing note reviewed.  Constitutional:      General: He is not in acute distress.    Appearance: He is well-developed.  HENT:     Head: Normocephalic and atraumatic.     Mouth/Throat:     Pharynx: No oropharyngeal exudate.  Eyes:     Conjunctiva/sclera: Conjunctivae normal.     Pupils: Pupils are equal, round, and reactive to light.  Neck:     Comments: No meningismus. Cardiovascular:     Rate and Rhythm: Normal rate and regular rhythm.     Heart sounds: Normal heart sounds. No murmur heard. Pulmonary:     Effort: Pulmonary effort is normal. No respiratory distress.     Breath sounds: Normal breath sounds.  Abdominal:     Palpations: Abdomen is soft.     Tenderness: There is abdominal tenderness. There is guarding. There is no rebound.     Comments: Right lower quadrant tenderness with voluntary guarding  Musculoskeletal:        General: No tenderness. Normal range of motion.     Cervical back: Normal range of motion and neck supple.     Right lower leg: Edema present.     Left lower leg: Edema present.     Comments: Fistula left lower arm with thrill No CVA tenderness  Skin:    General: Skin is warm.  Neurological:     Mental Status: He is alert and oriented to person, place, and time.     Cranial Nerves: No cranial nerve  deficit.     Motor: No abnormal muscle tone.     Coordination: Coordination normal.     Comments: No ataxia on finger to nose bilaterally. No pronator drift. 5/5 strength throughout. CN 2-12 intact.Equal grip strength. Sensation intact.   Psychiatric:        Behavior: Behavior normal.     ED Results / Procedures / Treatments   Labs (all labs ordered are listed, but only abnormal results are displayed) Labs Reviewed  CBC WITH DIFFERENTIAL/PLATELET - Abnormal; Notable for the following components:      Result Value   RBC 3.45 (*)    Hemoglobin 10.3 (*)  HCT 32.1 (*)    All other components within normal limits  COMPREHENSIVE METABOLIC PANEL - Abnormal; Notable for the following components:   Chloride 93 (*)    BUN 29 (*)    Creatinine, Ser 9.27 (*)    Calcium 8.0 (*)    GFR, Estimated 5 (*)    All other components within normal limits  TROPONIN I (HIGH SENSITIVITY) - Abnormal; Notable for the following components:   Troponin I (High Sensitivity) 36 (*)    All other components within normal limits  TROPONIN I (HIGH SENSITIVITY) - Abnormal; Notable for the following components:   Troponin I (High Sensitivity) 35 (*)    All other components within normal limits  LIPASE, BLOOD  URINALYSIS, ROUTINE W REFLEX MICROSCOPIC    EKG EKG Interpretation  Date/Time:  Thursday January 19 2023 12:35:07 EST Ventricular Rate:  74 PR Interval:  195 QRS Duration: 95 QT Interval:  445 QTC Calculation: 494 R Axis:   57 Text Interpretation: Sinus rhythm Atrial premature complex Borderline prolonged QT interval No significant change was found Confirmed by Ezequiel Essex [phone]) on 01/19/2023 1:23:22 PM  Radiology No results found.  Procedures Procedures    Medications Ordered in ED Medications - No data to display  ED Course/ Medical Decision Making/ A&P                             Medical Decision Making Amount and/or Complexity of Data Reviewed Labs: ordered. Decision-making  details documented in ED Course. Radiology: ordered and independent interpretation performed. Decision-making details documented in ED Course. ECG/medicine tests: ordered and independent interpretation performed. Decision-making details documented in ED Course.  Risk Prescription drug management.   Right-sided lower abdominal pain for the past 2 weeks.  No vomiting, fever, vomiting diarrhea or constipation.  Vital stable, no distress, abdomen soft without peritoneal signs.  Pain and nausea control given.  Labs obtained. CT to be obtained to assess for appendicitis versus kidney stone versus other pathology.   Labs consistent with ESRD. K is normal. Troponin mininmally elevated and flat.  CXR with R effusion and atelectasis. No increased work of breathing or hypoxia.   CT pending at shift change to assess for appendicitis. Anticipate discharge home if reassuring.  Dr. Maryan Rued to assume care.         Final Clinical Impression(s) / ED Diagnoses Final diagnoses:  None    Rx / DC Orders ED Discharge Orders     None         Laquitta Dominski, Annie Main, MD 01/19/23 1705

## 2023-01-19 NOTE — ED Notes (Signed)
[facility]AR notified of transport request back to [facility]

## 2023-01-19 NOTE — Discharge Instructions (Addendum)
Your scans today did not show any cause for the pain that you are having.  There were no issues that showed up with your bowel and no signs of appendicitis.  Continue to go to your regularly scheduled dialysis.  Return if you start having vomiting fever or passing out.

## 2023-01-19 NOTE — ED Provider Notes (Signed)
2 weeks of RLQ pain.  Missed dialysis Tuesday but went today.  CT to eval pain.  Patient's labs are consistent with end-stage renal disease but no acute findings today.  Troponin is flat in the 30s.  I have independently visualized and interpreted pt's images today. CT of the abdomen pelvis today without signs of hydronephrosis or stones.  Radiology does report there is bilateral pleural effusions with some atelectasis and stable cysts.  Patient does have an periumbilical abdominal wall hernia without evidence of incarceration.  Patient does not have significant pain on exam currently.  At this time he is stable for discharge home.   Blanchie Dessert, MD 01/19/23 1700

## 2023-01-19 NOTE — ED Triage Notes (Addendum)
Patient BIB EMS. Patient reports 2 weeks of worsening RLQ abdominal pain. Patient denies nausea, vomiting, diarrhea, and constipation. SNF staff reports patient has not been taking his stool softener. Patient reports LBM yesterday.
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
## 2023-02-28 ENCOUNTER — Ambulatory Visit: Payer: 59 | Attending: General Practice | Admitting: General Practice

## 2023-03-31 ENCOUNTER — Ambulatory Visit (INDEPENDENT_AMBULATORY_CARE_PROVIDER_SITE_OTHER): Payer: 59 | Admitting: Physician Assistant

## 2023-03-31 ENCOUNTER — Ambulatory Visit (INDEPENDENT_AMBULATORY_CARE_PROVIDER_SITE_OTHER)
Admission: RE | Admit: 2023-03-31 | Discharge: 2023-03-31 | Disposition: A | Discharge status: Home / Self Care | Payer: 59 | Source: Ambulatory Visit | Attending: Vascular Surgery | Admitting: Vascular Surgery

## 2023-03-31 ENCOUNTER — Ambulatory Visit (HOSPITAL_COMMUNITY)
Admission: RE | Admit: 2023-03-31 | Discharge: 2023-03-31 | Disposition: A | Discharge status: Home / Self Care | Payer: 59 | Source: Ambulatory Visit | Attending: Vascular Surgery | Admitting: Vascular Surgery

## 2023-03-31 VITALS — BP 195/76 | HR 65 | Temp 98.0°F | Resp 16 | Wt 201.6 lb

## 2023-03-31 DIAGNOSIS — I739 Peripheral vascular disease, unspecified: Secondary | ICD-10-CM | POA: Diagnosis present

## 2023-03-31 DIAGNOSIS — I70402 Unspecified atherosclerosis of autologous vein bypass graft(s) of the extremities, left leg: Secondary | ICD-10-CM | POA: Diagnosis not present

## 2023-03-31 LAB — VAS US ABI WITH/WO TBI: Left ABI: 1

## 2023-03-31 NOTE — H&P (View-Only) (Signed)
  Office Note   History of Present Illness   [name] is a 72 y.o. (12/03/1951) male who presents for surveillance of PAD.  He has a history of right popliteal to PT bypass with vein by Dr. Fields in 2006.  He also has a history of left popliteal to PT bypass with vein and left transmetatarsal amputation by Dr. Fields in 2022.  At his last follow-up with our office he had no new wounds and had no rest pain.  He returns today for follow-up.  He denies any recent changes to his health.  He denies any rest pain, claudication, nonhealing wounds of lower extremities.  He still has chronic leg swelling bilaterally due to lymphedema and elevates his legs regularly.  He is a former smoker.  He is on Eliquis for atrial fibrillation.  He is not on Pletal.  He does not vigorously exercise, however he endorses some walking daily.  He dialyzes on Tuesdays, Thursdays, and Saturdays.  He currently resides at [facility] nursing facility.  Current Outpatient Medications  Medication Sig Dispense Refill   acetaminophen (TYLENOL) 325 MG tablet Take 2 tablets (650 mg total) by mouth every 6 (six) hours as needed for mild pain (or Fever >/= 101).     Amino Acids-Protein Hydrolys (FEEDING SUPPLEMENT, PRO-STAT 64,) LIQD Take 30 mLs by mouth 3 (three) times daily with meals.     amLODipine (NORVASC) 10 MG tablet Take 10 mg by mouth daily.     apixaban (ELIQUIS) 5 MG TABS tablet Take 1 tablet (5 mg total) by mouth 2 (two) times daily. 60 tablet    atorvastatin (LIPITOR) 80 MG tablet Take 80 mg by mouth daily.     B Complex-C-Folic Acid (DIALYVITE 800) 0.8 MG TABS Take 1 tablet by mouth daily. (Patient not taking: Reported on 07/29/2022)     Darbepoetin Alfa (ARANESP) 100 MCG/0.5ML SOSY injection Inject 100 mcg into the skin. (Patient not taking: Reported on 07/29/2022)     docusate sodium (COLACE) 100 MG capsule Take 100 mg by mouth 2 (two) times daily. (Patient not taking: Reported on 07/29/2022)      glucagon (GLUCAGEN) 1 MG SOLR injection Inject 1 mg into the vein once as needed for low blood sugar. (Patient not taking: Reported on 07/29/2022)     levothyroxine (SYNTHROID) 50 MCG tablet Take 50 mcg by mouth daily before breakfast.     metoprolol succinate (TOPROL-XL) 25 MG 24 hr tablet Take 1 tablet (25 mg total) by mouth daily.     ondansetron (ZOFRAN) 4 MG tablet Take 1 tablet (4 mg total) by mouth every 6 (six) hours as needed for nausea. (Patient not taking: Reported on 07/29/2022) 20 tablet 0   pantoprazole (PROTONIX) 40 MG tablet Take 1 tablet (40 mg total) by mouth daily. (Patient not taking: Reported on 07/29/2022)     sucroferric oxyhydroxide (VELPHORO) 500 MG chewable tablet Chew 1 tablet (500 mg total) by mouth 3 (three) times daily with meals. 90 tablet    vitamin C (ASCORBIC ACID) 500 MG tablet Take 500 mg by mouth daily.     zinc sulfate 220 (50 Zn) MG capsule Take 220 mg by mouth daily. (Patient not taking: Reported on 07/29/2022)     No current facility-administered medications for this visit.    REVIEW OF SYSTEMS (negative unless checked):   Cardiac:  [] Chest pain or chest pressure? [] Shortness of breath upon activity? [] Shortness of breath when lying flat? [] Irregular heart rhythm?    Vascular:  [] Pain in calf, thigh, or hip brought on by walking? [] Pain in feet at night that wakes you up from your sleep? [] Blood clot in your veins? [x] Leg swelling?  Pulmonary:  [] Oxygen at home? [] Productive cough? [] Wheezing?  Neurologic:  [] Sudden weakness in arms or legs? [] Sudden numbness in arms or legs? [] Sudden onset of difficult speaking or slurred speech? [] Temporary loss of vision in one eye? [] Problems with dizziness?  Gastrointestinal:  [] Blood in stool? [] Vomited blood?  Genitourinary:  [] Burning when urinating? [] Blood in urine?  Psychiatric:  [] Major depression  Hematologic:  [] Bleeding problems? [] Problems with blood  clotting?  Dermatologic:  [] Rashes or ulcers?  Constitutional:  [] Fever or chills?  Ear/Nose/Throat:  [] Change in hearing? [] Nose bleeds? [] Sore throat?  Musculoskeletal:  [] Back pain? [] Joint pain? [] Muscle pain?   Physical Examination   Vitals:   03/31/23 1019  BP: (!) 195/76  Pulse: 65  Resp: 16  Temp: 98 F (36.7 C)  TempSrc: Temporal  SpO2: 98%  Weight: 201 lb 9.6 oz (91.4 kg)   Body mass index is 28.12 kg/m.  General:  WDWN in NAD; vital signs documented above Gait: Not observed HENT: WNL, normocephalic Pulmonary: normal non-labored breathing  Cardiac: regular HR, without murmurs without carotid bruit Abdomen: soft, NT, no masses Skin: without rashes Vascular Exam/Pulses: Palpable femoral pulses bilaterally.  Nonpalpable popliteal or pedal pulses bilaterally.  Monophasic right PT/DP Doppler signals.  Biphasic left PT and monophasic left DP Doppler signals. Healed left TMA. Extremities: without ischemic changes, without gangrene , without cellulitis; without open wounds;  Musculoskeletal: no muscle wasting or atrophy  Neurologic: A&O X 3;  No focal weakness or paresthesias are detected Psychiatric:  The pt has Normal affect.  Non-Invasive Vascular Imaging ABI (03/31/2023) R:  ABI: [place] (1.08),  PT: mono DP: mono TBI:  0.23 (0.31) L:  ABI: 1.0 ([place]),  PT: bi DP: mono TBI: 1.00 (0.91)  LLE Bypass Duplex (03/31/2023) LEFT      PSV cm/sRatioStenosis       Waveform Comments  +----------+--------+-----+---------------+---------+--------+  CFA Prox  105                         biphasic           +----------+--------+-----+---------------+---------+--------+  DFA      129                         biphasic           +----------+--------+-----+---------------+---------+--------+  SFA Prox  149                         biphasic           +----------+--------+-----+---------------+---------+--------+  SFA Mid   303           50-74% stenosistriphasic          +----------+--------+-----+---------------+---------+--------+  SFA Distal251          50-74% stenosistriphasic          +----------+--------+-----+---------------+---------+--------+     Left Graft #1: BK pop to PTA  +--------------------+--------+---------------+--------+--------+                     PSV cm/sStenosis       WaveformComments  +--------------------+--------+---------------+--------+--------+  Inflow               120                    biphasic          +--------------------+--------+---------------+--------+--------+  Proximal Anastomosis230     50-70% stenosisbiphasic          +--------------------+--------+---------------+--------+--------+  Proximal Graft      101                    biphasic          +--------------------+--------+---------------+--------+--------+  Mid Graft           72                     biphasic          +--------------------+--------+---------------+--------+--------+  Distal Graft        95                     biphasic          +--------------------+--------+---------------+--------+--------+  Distal Anastomosis  122                    biphasic          +--------------------+--------+---------------+--------+--------+  Outflow            132                    biphasic           RLE Bypass Duplex (03/31/2023) --------+--------+-----+---------------+----------+--------+  RIGHT    PSV cm/sRatioStenosis       Waveform  Comments  +----------+--------+-----+---------------+----------+--------+  CFA Prox  149                         monophasic          +----------+--------+-----+---------------+----------+--------+  DFA      129                         biphasic            +----------+--------+-----+---------------+----------+--------+  SFA Prox  143                         biphasic             +----------+--------+-----+---------------+----------+--------+  SFA Mid   227          50-74% stenosisbiphasic            +----------+--------+-----+---------------+----------+--------+  SFA Distal345          50-74% stenosisbiphasic            +----------+--------+-----+---------------+----------+--------+     Right Graft #1: BK pop to PTA  +------------------+--------+-------------+----------+--------+                   PSV cm/sStenosis     Waveform  Comments  +------------------+--------+-------------+----------+--------+  Inflow           105                  monophasic          +------------------+--------+-------------+----------+--------+  Prox Anastomosis  121                  monophasic          +------------------+--------+-------------+----------+--------+  Proximal Graft    41                     monophasic          +------------------+--------+-------------+----------+--------+  Mid Graft         45                   monophasic          +------------------+--------+-------------+----------+--------+  Distal Graft      724     >70% stenosismonophasic          +------------------+--------+-------------+----------+--------+  Distal Anastomosis96                   monophasic          +------------------+--------+-------------+----------+--------+  Outflow          43                   monophasic          +------------------+--------+-------------+----------+--------+    Medical Decision Making   Emanuel A Shells is a 71 y.o. male who presents for surveillance of bilateral bypass grafts  Based on the patient's vascular studies, his ABIs on the right are noncompressible with monophasic PT/DP Doppler flow.  His TBI's have decreased from 0.31 to 0.23.  His ABIs on the left are 1.0 with biphasic PT flow and a TBI of 1.00. Duplex of the right lower extremity demonstrates moderate stenosis of the mid and distal SFA with  elevated velocities of 227 and 345 respectively.  Duplex of the right popliteal to PT artery bypass demonstrates a patent bypass with high-grade distal graft stenosis with elevated velocities of 724.  Duplex of the left lower extremity demonstrates moderate stenosis of the mid and distal SFA with elevated velocities of 303 and 251.  Duplex of the left popliteal to PT artery bypass demonstrates a patent bypass with moderate proximal stenosis with elevated velocities of 230.  The patient denies any rest pain, claudication, nonhealing wounds of the lower extremities.  He still has bilateral lower extremity swelling due to lymphedema, without ulceration. On exam he has palpable femoral pulses.  He has nonpalpable popliteal or pedal pulses bilaterally.  He has monophasic DP/PT Doppler signals on the right.  He has a biphasic left PT and monophasic right DP Doppler signal. I have discussed with the patient that he will likely require angiogram of the right lower extremity within the next 2 to 3 weeks.  Duplex of the right lower extremity demonstrates moderate mid to distal SFA disease with elevated velocities and high-grade stenosis of the distal popliteal-PT bypass with elevated velocities of 724.  He will likely require abdominal aortogram with right lower extremity runoff and treatment of the SFA inflow and infrapopliteal posterior tibial outflow due to stenosis.  He will need to hold his Eliquis prior to the procedure.  We can get this scheduled on a nondialysis day with Dr. Cain within the next 2 to 3 weeks. We can repeat duplex of the left lower extremity bypass duplex within the next 3 months for surveillance of moderate bypass stenosis.  Keishawn Rajewski PA-C [facility] Office: [phone]  Clinic MD: Cain 

## 2023-03-31 NOTE — Progress Notes (Signed)
Office Note   History of Present Illness   [name] is a 72 y.o. (03-19-1951) male who presents for surveillance of PAD.  He has a history of right popliteal to PT bypass with vein by Dr. Darrick Penna in 2006.  He also has a history of left popliteal to PT bypass with vein and left transmetatarsal amputation by Dr. Darrick Penna in 2022.  At his last follow-up with our office he had no new wounds and had no rest pain.  He returns today for follow-up.  He denies any recent changes to his health.  He denies any rest pain, claudication, nonhealing wounds of lower extremities.  He still has chronic leg swelling bilaterally due to lymphedema and elevates his legs regularly.  He is a former smoker.  He is on Eliquis for atrial fibrillation.  He is not on Pletal.  He does not vigorously exercise, however he endorses some walking daily.  He dialyzes on Tuesdays, Thursdays, and Saturdays.  He currently resides at [facility] nursing facility.  Current Outpatient Medications  Medication Sig Dispense Refill   acetaminophen (TYLENOL) 325 MG tablet Take 2 tablets (650 mg total) by mouth every 6 (six) hours as needed for mild pain (or Fever >/= 101).     Amino Acids-Protein Hydrolys (FEEDING SUPPLEMENT, PRO-STAT 64,) LIQD Take 30 mLs by mouth 3 (three) times daily with meals.     amLODipine (NORVASC) 10 MG tablet Take 10 mg by mouth daily.     apixaban (ELIQUIS) 5 MG TABS tablet Take 1 tablet (5 mg total) by mouth 2 (two) times daily. 60 tablet    atorvastatin (LIPITOR) 80 MG tablet Take 80 mg by mouth daily.     B Complex-C-Folic Acid (DIALYVITE 800) 0.8 MG TABS Take 1 tablet by mouth daily. (Patient not taking: Reported on 07/29/2022)     Darbepoetin Alfa (ARANESP) 100 MCG/0.5ML SOSY injection Inject 100 mcg into the skin. (Patient not taking: Reported on 07/29/2022)     docusate sodium (COLACE) 100 MG capsule Take 100 mg by mouth 2 (two) times daily. (Patient not taking: Reported on 07/29/2022)      glucagon (GLUCAGEN) 1 MG SOLR injection Inject 1 mg into the vein once as needed for low blood sugar. (Patient not taking: Reported on 07/29/2022)     levothyroxine (SYNTHROID) 50 MCG tablet Take 50 mcg by mouth daily before breakfast.     metoprolol succinate (TOPROL-XL) 25 MG 24 hr tablet Take 1 tablet (25 mg total) by mouth daily.     ondansetron (ZOFRAN) 4 MG tablet Take 1 tablet (4 mg total) by mouth every 6 (six) hours as needed for nausea. (Patient not taking: Reported on 07/29/2022) 20 tablet 0   pantoprazole (PROTONIX) 40 MG tablet Take 1 tablet (40 mg total) by mouth daily. (Patient not taking: Reported on 07/29/2022)     sucroferric oxyhydroxide (VELPHORO) 500 MG chewable tablet Chew 1 tablet (500 mg total) by mouth 3 (three) times daily with meals. 90 tablet    vitamin C (ASCORBIC ACID) 500 MG tablet Take 500 mg by mouth daily.     zinc sulfate 220 (50 Zn) MG capsule Take 220 mg by mouth daily. (Patient not taking: Reported on 07/29/2022)     No current facility-administered medications for this visit.    REVIEW OF SYSTEMS (negative unless checked):   Cardiac:   Chest pain or chest pressure?  Shortness of breath upon activity?  Shortness of breath when lying flat?  Irregular heart rhythm?  Vascular:  []  Pain in calf, thigh, or hip brought on by walking? []  Pain in feet at night that wakes you up from your sleep? []  Blood clot in your veins? [x]  Leg swelling?  Pulmonary:  []  Oxygen at home? []  Productive cough? []  Wheezing?  Neurologic:  []  Sudden weakness in arms or legs? []  Sudden numbness in arms or legs? []  Sudden onset of difficult speaking or slurred speech? []  Temporary loss of vision in one eye? []  Problems with dizziness?  Gastrointestinal:  []  Blood in stool? []  Vomited blood?  Genitourinary:  []  Burning when urinating? []  Blood in urine?  Psychiatric:  []  Major depression  Hematologic:  []  Bleeding problems? []  Problems with blood  clotting?  Dermatologic:  []  Rashes or ulcers?  Constitutional:  []  Fever or chills?  Ear/Nose/Throat:  []  Change in hearing? []  Nose bleeds? []  Sore throat?  Musculoskeletal:  []  Back pain? []  Joint pain? []  Muscle pain?   Physical Examination   Vitals:   03/31/23 1019  BP: (!) 195/76  Pulse: 65  Resp: 16  Temp: 98 F (36.7 C)  TempSrc: Temporal  SpO2: 98%  Weight: 201 lb 9.6 oz (91.4 kg)   Body mass index is 28.12 kg/m.  General:  WDWN in NAD; vital signs documented above Gait: Not observed HENT: WNL, normocephalic Pulmonary: normal non-labored breathing  Cardiac: regular HR, without murmurs without carotid bruit Abdomen: soft, NT, no masses Skin: without rashes Vascular Exam/Pulses: Palpable femoral pulses bilaterally.  Nonpalpable popliteal or pedal pulses bilaterally.  Monophasic right PT/DP Doppler signals.  Biphasic left PT and monophasic left DP Doppler signals. Healed left TMA. Extremities: without ischemic changes, without gangrene , without cellulitis; without open wounds;  Musculoskeletal: no muscle wasting or atrophy  Neurologic: A&O X 3;  No focal weakness or paresthesias are detected Psychiatric:  The pt has Normal affect.  Non-Invasive Vascular Imaging ABI (03/31/2023) R:  ABI: [place] (1.08),  PT: mono DP: mono TBI:  0.23 (0.31) L:  ABI: 1.0 ([place]),  PT: bi DP: mono TBI: 1.00 (0.91)  LLE Bypass Duplex (03/31/2023) LEFT      PSV cm/sRatioStenosis       Waveform Comments  +----------+--------+-----+---------------+---------+--------+  CFA Prox  105                         biphasic           +----------+--------+-----+---------------+---------+--------+  DFA      129                         biphasic           +----------+--------+-----+---------------+---------+--------+  SFA Prox  149                         biphasic           +----------+--------+-----+---------------+---------+--------+  SFA Mid   303           50-74% stenosistriphasic          +----------+--------+-----+---------------+---------+--------+  SFA Distal251          50-74% stenosistriphasic          +----------+--------+-----+---------------+---------+--------+     Left Graft #1: BK pop to PTA  +--------------------+--------+---------------+--------+--------+                     PSV cm/sStenosis       WaveformComments  +--------------------+--------+---------------+--------+--------+  Inflow  120                    biphasic          +--------------------+--------+---------------+--------+--------+  Proximal Anastomosis230     50-70% stenosisbiphasic          +--------------------+--------+---------------+--------+--------+  Proximal Graft      101                    biphasic          +--------------------+--------+---------------+--------+--------+  Mid Graft           72                     biphasic          +--------------------+--------+---------------+--------+--------+  Distal Graft        95                     biphasic          +--------------------+--------+---------------+--------+--------+  Distal Anastomosis  122                    biphasic          +--------------------+--------+---------------+--------+--------+  Outflow            132                    biphasic           RLE Bypass Duplex (03/31/2023) --------+--------+-----+---------------+----------+--------+  RIGHT    PSV cm/sRatioStenosis       Waveform  Comments  +----------+--------+-----+---------------+----------+--------+  CFA Prox  149                         monophasic          +----------+--------+-----+---------------+----------+--------+  DFA      129                         biphasic            +----------+--------+-----+---------------+----------+--------+  SFA Prox  143                         biphasic             +----------+--------+-----+---------------+----------+--------+  SFA Mid   227          50-74% stenosisbiphasic            +----------+--------+-----+---------------+----------+--------+  SFA Distal345          50-74% stenosisbiphasic            +----------+--------+-----+---------------+----------+--------+     Right Graft #1: BK pop to PTA  +------------------+--------+-------------+----------+--------+                   PSV cm/sStenosis     Waveform  Comments  +------------------+--------+-------------+----------+--------+  Inflow           105                  monophasic          +------------------+--------+-------------+----------+--------+  Prox Anastomosis  121                  monophasic          +------------------+--------+-------------+----------+--------+  Proximal Graft    41  monophasic          +------------------+--------+-------------+----------+--------+  Mid Graft         45                   monophasic          +------------------+--------+-------------+----------+--------+  Distal Graft      724     >70% stenosismonophasic          +------------------+--------+-------------+----------+--------+  Distal Anastomosis96                   monophasic          +------------------+--------+-------------+----------+--------+  Outflow          43                   monophasic          +------------------+--------+-------------+----------+--------+    Medical Decision Making   VINICIO LYNK is a 72 y.o. male who presents for surveillance of bilateral bypass grafts  Based on the patient's vascular studies, his ABIs on the right are noncompressible with monophasic PT/DP Doppler flow.  His TBI's have decreased from 0.31 to 0.23.  His ABIs on the left are 1.0 with biphasic PT flow and a TBI of 1.00. Duplex of the right lower extremity demonstrates moderate stenosis of the mid and distal SFA with  elevated velocities of 227 and 345 respectively.  Duplex of the right popliteal to PT artery bypass demonstrates a patent bypass with high-grade distal graft stenosis with elevated velocities of 724.  Duplex of the left lower extremity demonstrates moderate stenosis of the mid and distal SFA with elevated velocities of 303 and 251.  Duplex of the left popliteal to PT artery bypass demonstrates a patent bypass with moderate proximal stenosis with elevated velocities of 230.  The patient denies any rest pain, claudication, nonhealing wounds of the lower extremities.  He still has bilateral lower extremity swelling due to lymphedema, without ulceration. On exam he has palpable femoral pulses.  He has nonpalpable popliteal or pedal pulses bilaterally.  He has monophasic DP/PT Doppler signals on the right.  He has a biphasic left PT and monophasic right DP Doppler signal. I have discussed with the patient that he will likely require angiogram of the right lower extremity within the next 2 to 3 weeks.  Duplex of the right lower extremity demonstrates moderate mid to distal SFA disease with elevated velocities and high-grade stenosis of the distal popliteal-PT bypass with elevated velocities of 724.  He will likely require abdominal aortogram with right lower extremity runoff and treatment of the SFA inflow and infrapopliteal posterior tibial outflow due to stenosis.  He will need to hold his Eliquis prior to the procedure.  We can get this scheduled on a nondialysis day with Dr. Randie Heinz within the next 2 to 3 weeks. We can repeat duplex of the left lower extremity bypass duplex within the next 3 months for surveillance of moderate bypass stenosis.  Loel Dubonnet PA-C [facility] Office: [phone]  Clinic MD: Randie Heinz

## 2023-04-03 ENCOUNTER — Other Ambulatory Visit: Payer: Self-pay

## 2023-04-03 DIAGNOSIS — I70402 Unspecified atherosclerosis of autologous vein bypass graft(s) of the extremities, left leg: Secondary | ICD-10-CM

## 2023-04-03 MED ORDER — SODIUM CHLORIDE 0.9% FLUSH
3.0000 mL | Freq: Two times a day (BID) | INTRAVENOUS | Status: DC
Start: 2023-04-03 — End: 2024-04-18

## 2023-04-03 MED ORDER — SODIUM CHLORIDE 0.9 % IV SOLN
250.0000 mL | INTRAVENOUS | Status: DC | PRN
Start: 2023-04-03 — End: 2024-04-18

## 2023-04-17 ENCOUNTER — Encounter (HOSPITAL_COMMUNITY): Payer: Self-pay | Admitting: Vascular Surgery

## 2023-04-17 ENCOUNTER — Ambulatory Visit (HOSPITAL_COMMUNITY)
Admission: RE | Admit: 2023-04-17 | Discharge: 2023-04-17 | Disposition: A | Discharge status: Home / Self Care | Payer: 59 | Attending: Vascular Surgery | Admitting: Vascular Surgery

## 2023-04-17 ENCOUNTER — Encounter (HOSPITAL_COMMUNITY)
Admission: RE | Disposition: A | Discharge status: Home / Self Care | Payer: Self-pay | Source: Home / Self Care | Attending: Vascular Surgery

## 2023-04-17 ENCOUNTER — Other Ambulatory Visit: Payer: Self-pay

## 2023-04-17 DIAGNOSIS — I4891 Unspecified atrial fibrillation: Secondary | ICD-10-CM | POA: Diagnosis not present

## 2023-04-17 DIAGNOSIS — Z992 Dependence on renal dialysis: Secondary | ICD-10-CM | POA: Diagnosis not present

## 2023-04-17 DIAGNOSIS — T82858A Stenosis of vascular prosthetic devices, implants and grafts, initial encounter: Secondary | ICD-10-CM | POA: Diagnosis not present

## 2023-04-17 DIAGNOSIS — N186 End stage renal disease: Secondary | ICD-10-CM | POA: Insufficient documentation

## 2023-04-17 DIAGNOSIS — I890 Lymphedema, not elsewhere classified: Secondary | ICD-10-CM | POA: Diagnosis not present

## 2023-04-17 DIAGNOSIS — Z89422 Acquired absence of other left toe(s): Secondary | ICD-10-CM | POA: Diagnosis not present

## 2023-04-17 DIAGNOSIS — Z7901 Long term (current) use of anticoagulants: Secondary | ICD-10-CM | POA: Diagnosis not present

## 2023-04-17 DIAGNOSIS — Y832 Surgical operation with anastomosis, bypass or graft as the cause of abnormal reaction of the patient, or of later complication, without mention of misadventure at the time of the procedure: Secondary | ICD-10-CM | POA: Diagnosis not present

## 2023-04-17 DIAGNOSIS — Z87891 Personal history of nicotine dependence: Secondary | ICD-10-CM | POA: Diagnosis not present

## 2023-04-17 DIAGNOSIS — I70402 Unspecified atherosclerosis of autologous vein bypass graft(s) of the extremities, left leg: Secondary | ICD-10-CM

## 2023-04-17 DIAGNOSIS — T82898A Other specified complication of vascular prosthetic devices, implants and grafts, initial encounter: Secondary | ICD-10-CM | POA: Diagnosis not present

## 2023-04-17 HISTORY — PX: ABDOMINAL AORTOGRAM W/LOWER EXTREMITY: CATH118223

## 2023-04-17 HISTORY — PX: PERIPHERAL VASCULAR INTERVENTION: CATH118257

## 2023-04-17 LAB — POCT I-STAT, CHEM 8
BUN: 37 mg/dL — ABNORMAL HIGH (ref 8–23)
BUN: 46 mg/dL — ABNORMAL HIGH (ref 8–23)
Calcium, Ion: 1.19 mmol/L (ref 1.15–1.40)
Calcium, Ion: 1.23 mmol/L (ref 1.15–1.40)
Chloride: 97 mmol/L — ABNORMAL LOW (ref 98–111)
Chloride: 98 mmol/L (ref 98–111)
Creatinine, Ser: 9 mg/dL — ABNORMAL HIGH (ref 0.61–1.24)
Creatinine, Ser: 9.1 mg/dL — ABNORMAL HIGH (ref 0.61–1.24)
Glucose, Bld: 83 mg/dL (ref 70–99)
Glucose, Bld: 84 mg/dL (ref 70–99)
HCT: 32 % — ABNORMAL LOW (ref 39.0–52.0)
HCT: 33 % — ABNORMAL LOW (ref 39.0–52.0)
Hemoglobin: 10.9 g/dL — ABNORMAL LOW (ref 13.0–17.0)
Hemoglobin: 11.2 g/dL — ABNORMAL LOW (ref 13.0–17.0)
Potassium: 4 mmol/L (ref 3.5–5.1)
Potassium: 5.8 mmol/L — ABNORMAL HIGH (ref 3.5–5.1)
Sodium: 138 mmol/L (ref 135–145)
Sodium: 140 mmol/L (ref 135–145)
TCO2: 34 mmol/L — ABNORMAL HIGH (ref 22–32)
TCO2: 35 mmol/L — ABNORMAL HIGH (ref 22–32)

## 2023-04-17 LAB — GLUCOSE, CAPILLARY
Glucose-Capillary: 73 mg/dL (ref 70–99)
Glucose-Capillary: 74 mg/dL (ref 70–99)

## 2023-04-17 SURGERY — ABDOMINAL AORTOGRAM W/LOWER EXTREMITY
Anesthesia: LOCAL | Laterality: Right

## 2023-04-17 MED ORDER — SODIUM CHLORIDE 0.9% FLUSH: 3.0000 mL | INTRAVENOUS | Status: DC | PRN

## 2023-04-17 MED ORDER — HYDRALAZINE HCL 20 MG/ML IJ SOLN: 5.0000 mg | INTRAMUSCULAR | Status: DC | PRN

## 2023-04-17 MED ORDER — LABETALOL HCL 5 MG/ML IV SOLN
INTRAVENOUS | Status: DC | PRN
  Administered 2023-04-17: 10 mg via INTRAVENOUS

## 2023-04-17 MED ORDER — OXYCODONE HCL 5 MG PO TABS
5.0000 mg | ORAL_TABLET | ORAL | Status: DC | PRN
  Administered 2023-04-17: 5 mg via ORAL
  Filled 2023-04-17: qty 1

## 2023-04-17 MED ORDER — LABETALOL HCL 5 MG/ML IV SOLN: 10.0000 mg | INTRAVENOUS | Status: DC | PRN

## 2023-04-17 MED ORDER — MIDAZOLAM HCL 2 MG/2ML IJ SOLN
INTRAMUSCULAR | Status: AC
  Filled 2023-04-17: qty 2

## 2023-04-17 MED ORDER — MORPHINE SULFATE (PF) 2 MG/ML IV SOLN: 2.0000 mg | INTRAVENOUS | Status: DC | PRN

## 2023-04-17 MED ORDER — FENTANYL CITRATE (PF) 100 MCG/2ML IJ SOLN
INTRAMUSCULAR | Status: DC | PRN
  Administered 2023-04-17: 50 ug via INTRAVENOUS

## 2023-04-17 MED ORDER — HEPARIN SODIUM (PORCINE) 1000 UNIT/ML IJ SOLN
INTRAMUSCULAR | Status: AC
  Filled 2023-04-17: qty 10

## 2023-04-17 MED ORDER — LABETALOL HCL 5 MG/ML IV SOLN
INTRAVENOUS | Status: AC
  Filled 2023-04-17: qty 4

## 2023-04-17 MED ORDER — HYDRALAZINE HCL 20 MG/ML IJ SOLN
INTRAMUSCULAR | Status: AC
  Filled 2023-04-17: qty 1

## 2023-04-17 MED ORDER — FENTANYL CITRATE (PF) 100 MCG/2ML IJ SOLN
INTRAMUSCULAR | Status: AC
  Filled 2023-04-17: qty 2

## 2023-04-17 MED ORDER — SODIUM CHLORIDE 0.9% FLUSH: 3.0000 mL | Freq: Two times a day (BID) | INTRAVENOUS | Status: DC

## 2023-04-17 MED ORDER — ACETAMINOPHEN 325 MG PO TABS: 650.0000 mg | ORAL_TABLET | ORAL | Status: DC | PRN

## 2023-04-17 MED ORDER — ONDANSETRON HCL 4 MG/2ML IJ SOLN: 4.0000 mg | Freq: Four times a day (QID) | INTRAMUSCULAR | Status: DC | PRN

## 2023-04-17 MED ORDER — HEPARIN SODIUM (PORCINE) 1000 UNIT/ML IJ SOLN
INTRAMUSCULAR | Status: DC | PRN
  Administered 2023-04-17: 6000 [IU] via INTRAVENOUS

## 2023-04-17 MED ORDER — LIDOCAINE HCL (PF) 1 % IJ SOLN
INTRAMUSCULAR | Status: DC | PRN
  Administered 2023-04-17: 15 mL

## 2023-04-17 MED ORDER — HYDRALAZINE HCL 20 MG/ML IJ SOLN
INTRAMUSCULAR | Status: DC | PRN
  Administered 2023-04-17 (×2): 20 mg via INTRAVENOUS

## 2023-04-17 MED ORDER — LABETALOL HCL 5 MG/ML IV SOLN
10.0000 mg | INTRAVENOUS | Status: DC | PRN
  Administered 2023-04-17: 10 mg via INTRAVENOUS
  Filled 2023-04-17: qty 4

## 2023-04-17 MED ORDER — MIDAZOLAM HCL 2 MG/2ML IJ SOLN
INTRAMUSCULAR | Status: DC | PRN
  Administered 2023-04-17: 2 mg via INTRAVENOUS

## 2023-04-17 MED ORDER — LIDOCAINE HCL (PF) 1 % IJ SOLN
INTRAMUSCULAR | Status: AC
  Filled 2023-04-17: qty 30

## 2023-04-17 MED ORDER — HEPARIN (PORCINE) IN NACL 1000-0.9 UT/500ML-% IV SOLN
INTRAVENOUS | Status: DC | PRN
  Administered 2023-04-17 (×2): 500 mL

## 2023-04-17 MED ORDER — IODIXANOL 320 MG/ML IV SOLN
INTRAVENOUS | Status: DC | PRN
  Administered 2023-04-17: 75 mL

## 2023-04-17 MED ORDER — SODIUM CHLORIDE 0.9 % IV SOLN: 250.0000 mL | INTRAVENOUS | Status: DC | PRN

## 2023-04-17 SURGICAL SUPPLY — 19 items
BALLN MUSTANG 5X150X135 (BALLOONS) ×2
BALLOON MUSTANG 5X150X135 (BALLOONS) IMPLANT
CATH OMNI FLUSH 5F 65CM (CATHETERS) IMPLANT
CLOSURE MYNX CONTROL 6F/7F (Vascular Products) IMPLANT
GLIDEWIRE ADV .035X260CM (WIRE) IMPLANT
KIT ENCORE 26 ADVANTAGE (KITS) IMPLANT
KIT MICROPUNCTURE NIT STIFF (SHEATH) IMPLANT
KIT PV (KITS) ×3 IMPLANT
SHEATH CATAPULT 6FR 45 (SHEATH) IMPLANT
SHEATH PINNACLE 5F 10CM (SHEATH) IMPLANT
SHEATH PINNACLE 6F 10CM (SHEATH) IMPLANT
SHEATH PROBE COVER 6X72 (BAG) IMPLANT
STENT ELUVIA 6X150X130 (Permanent Stent) IMPLANT
STOPCOCK MORSE 400PSI 3WAY (MISCELLANEOUS) IMPLANT
SYR MEDRAD MARK 7 150ML (SYRINGE) ×3 IMPLANT
TRANSDUCER W/STOPCOCK (MISCELLANEOUS) ×3 IMPLANT
TRAY PV CATH (CUSTOM PROCEDURE TRAY) ×3 IMPLANT
TUBING CIL FLEX 10 FLL-RA (TUBING) IMPLANT
WIRE STARTER BENTSON 035X150 (WIRE) IMPLANT

## 2023-04-17 NOTE — Discharge Instructions (Signed)
Femoral Site Care This sheet gives you information about how to care for yourself after your procedure. Your health care provider may also give you more specific instructions. If you have problems or questions, contact your health care provider. What can I expect after the procedure?  After the procedure, it is common to have: Bruising that usually fades within 1-2 weeks. Tenderness at the site. Follow these instructions at home: Wound care Follow instructions from your health care provider about how to take care of your insertion site. Make sure you: Wash your hands with soap and water before you change your bandage (dressing). If soap and water are not available, use hand sanitizer. Remove your dressing as told by your health care provider. In 24 hours Do not take baths, swim, or use a hot tub until your health care provider approves. You may shower 24-48 hours after the procedure or as told by your health care provider. Gently wash the site with plain soap and water. Pat the area dry with a clean towel. Do not rub the site. This may cause bleeding. Do not apply powder or lotion to the site. Keep the site clean and dry. Check your femoral site every day for signs of infection. Check for: Redness, swelling, or pain. Fluid or blood. Warmth. Pus or a bad smell. Activity For the first 2-3 days after your procedure, or as long as directed: Avoid climbing stairs as much as possible. Do not squat. Do not lift anything that is heavier than 10 lb (4.5 kg), or the limit that you are told, until your health care provider says that it is safe. For 5 days Rest as directed. Avoid sitting for a long time without moving. Get up to take short walks every 1-2 hours. Do not drive for 24 hours if you were given a medicine to help you relax (sedative). General instructions Take over-the-counter and prescription medicines only as told by your health care provider. Keep all follow-up visits as told by  your health care provider. This is important. Contact a health care provider if you have: A fever or chills. You have redness, swelling, or pain around your insertion site. Get help right away if: The catheter insertion area swells very fast. You pass out. You suddenly start to sweat or your skin gets clammy. The catheter insertion area is bleeding, and the bleeding does not stop when you hold steady pressure on the area. The area near or just beyond the catheter insertion site becomes pale, cool, tingly, or numb. These symptoms may represent a serious problem that is an emergency. Do not wait to see if the symptoms will go away. Get medical help right away. Call your local emergency services (911 in the [place] Do not drive yourself to the hospital. Summary After the procedure, it is common to have bruising that usually fades within 1-2 weeks. Check your femoral site every day for signs of infection. Do not lift anything that is heavier than 10 lb (4.5 kg), or the limit that you are told, until your health care provider says that it is safe. This information is not intended to replace advice given to you by your health care provider. Make sure you discuss any questions you have with your health care provider. Document Revised: 12/11/2017 Document Reviewed: 12/11/2017 Elsevier Patient Education  2020 Elsevier Inc. 

## 2023-04-17 NOTE — Interval H&P Note (Signed)
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  The various methods of treatment have been discussed with the patient and family. After consideration of risks, benefits and other options for treatment, the patient has consented to  Procedure(s): ABDOMINAL AORTOGRAM W/LOWER EXTREMITY (N/A) as a surgical intervention.  The patient's history has been reviewed, patient examined, no change in status, stable for surgery.  I have reviewed the patient's chart and labs.  Questions were answered to the patient's satisfaction.     Lemar Livings

## 2023-04-17 NOTE — Op Note (Signed)
Patient name: [name] MRN: [id number] DOB: 07-Aug-1951 Sex: male  04/17/2023 Pre-operative Diagnosis: Threatened right lower extremity bypass graft with inflow disease noted in the mid and distal SFA and distal graft high-grade stenosis and left SFA mid and distal stenosis Post-operative diagnosis:  Same Surgeon:  Luanna Salk. Randie Heinz, MD Procedure Performed: 1.  Ultrasound-guided cannulation and percutaneous closure of left common femoral artery with Mynx device 2.  Moderate sedation with fentanyl and Versed for 35 minutes 3.  Aortogram with bilateral lower extremity angiography and catheter selection of right SFA 4.  Stent of right superficial femoral artery with 6 x 150 mm Eluvia  Indications: 72 year old male with a history of bilateral below-knee popliteal artery to the distal posterior tibial artery bypass graft to heal toe amputations.  On the right side he now has evidence of inflow disease in the right SFA and outflow disease in the bypass graft itself and is indicated for angiography for ongoing maintenance of the bypass graft.  On the left side he has evidence of moderate grade left SFA disease.  Findings: The left common femoral artery on access was somewhat calcified we are able to access in the very healthy area and performed Mynx device closure at completion.  The aorta was free of flow-limiting stenosis bilateral renal arteries actually filled despite patient being on dialysis.  Bilateral common iliac and internal iliac and external neck arteries are free of flow-limiting stenosis.  There is no flow-limiting stenosis of the bilateral common femoral arteries.  The right SFA has distal high-grade stenosis that does appear to be flow-limiting at least 60% reduced to less than 10% after stenting.  Below the knee the right popliteal artery is the initiation point of the vein bypass to the distal posterior tibial artery.  There is a valve present distally in the bypass graft although  there is no flow-limiting stenosis in the foot fills briskly.  No intervention was undertaken on bypass graft itself.  On the left side again the common femoral artery is patent and the SFA has moderate grade stenosis which does appear to be somewhat flow-limiting distally and below the left knee there is takeoff of the vein bypass graft which anastomosis distally the posterior tibial artery and again no stenosis is identified.  Plan will be for left SFA stenting to maintain bypass graft patency on the left on a nondialysis day in the near future.   Procedure:  The patient was identified in the holding area and taken to room 8.  The patient was then placed supine on the table and prepped and draped in the usual sterile fashion.  A time out was called.  Ultrasound was used to evaluate the left common femoral artery this was noted to be patent there was an area of calcium we are able to cannulate just above this using direct ultrasound visualization.  An ultrasound image was saved the permanent record.  The common femoral artery was cannulated with a micropuncture needle followed by wire and a sheath and a Bentson wire was placed followed by a 5 French sheath and Omni catheter was placed to the level of L1 and aortogram was performed.  We then crossed the bifurcation using Bentson wire and Omni catheter to the level of the common femoral artery and perform right lower extremity angiography with the above findings.  We then placed the catheter into the SFA and then placed a wire down past the lesion and a long 6 French sheath was placed  in the right SFA and 6000 units of heparin was administered.  We then primarily stented the right SFA with a 6 x 150 mm drug-eluting stent postdilated with 5 mm balloon.  Completion demonstrated less than 10% residual stenosis.  Satisfied with this we changed for a short 6 French sheath perform left lower extremity angiography with the above findings.  We will plan to stent the left  SFA on a nondialysis day in the near future.  We then deployed a minx device on the left and pressure was held until hemostasis obtained.  Patient did tolerate procedure without any complication.  Contrast: 75 cc  Izabella Marcantel C. Randie Heinz, MD [facility] Office: [phone] Pager: [phone]

## 2023-04-20 ENCOUNTER — Telehealth: Payer: Self-pay

## 2023-04-20 NOTE — Telephone Encounter (Signed)
Attempted to schedule pt's AGM. I spoke with [place] at [facility] who will give scheduler the message to return our call to schedule.

## 2023-04-24 ENCOUNTER — Other Ambulatory Visit: Payer: Self-pay

## 2023-04-24 DIAGNOSIS — I70402 Unspecified atherosclerosis of autologous vein bypass graft(s) of the extremities, left leg: Secondary | ICD-10-CM

## 2023-04-24 MED ORDER — SODIUM CHLORIDE 0.9 % IV SOLN
250.0000 mL | INTRAVENOUS | Status: DC | PRN
Start: 2023-04-24 — End: 2024-04-18

## 2023-04-24 MED ORDER — SODIUM CHLORIDE 0.9% FLUSH
3.0000 mL | Freq: Two times a day (BID) | INTRAVENOUS | Status: DC
Start: 2023-04-24 — End: 2024-04-18

## 2023-04-24 NOTE — Telephone Encounter (Signed)
Received a call from [place] at [facility]. Scheduled aortogram procedure for 5/20. Instructions provided and will also be faxed to facility. She verbalized understanding.

## 2023-05-01 ENCOUNTER — Other Ambulatory Visit: Payer: Self-pay

## 2023-05-01 ENCOUNTER — Ambulatory Visit (HOSPITAL_COMMUNITY)
Admission: RE | Admit: 2023-05-01 | Discharge: 2023-05-01 | Disposition: A | Discharge status: Home / Self Care | Payer: 59 | Attending: Vascular Surgery | Admitting: Vascular Surgery

## 2023-05-01 DIAGNOSIS — I70402 Unspecified atherosclerosis of autologous vein bypass graft(s) of the extremities, left leg: Secondary | ICD-10-CM

## 2023-05-01 SURGERY — ABDOMINAL AORTOGRAM W/LOWER EXTREMITY
Anesthesia: LOCAL

## 2023-05-01 MED ORDER — SODIUM CHLORIDE 0.9% FLUSH
3.0000 mL | Freq: Two times a day (BID) | INTRAVENOUS | Status: DC
Start: 2023-05-01 — End: 2024-04-18

## 2023-05-01 MED ORDER — SODIUM CHLORIDE 0.9 % IV SOLN: 250.0000 mL | INTRAVENOUS | Status: DC | PRN

## 2023-05-01 NOTE — Progress Notes (Signed)
    Patient presented to have angiogram to evaluate left lower extremity possibly treat SFA stenosis for bypass patency.  Patient states that yesterday he began oozing from the left groin.  I evaluated him in preop he does have some bruising and I was able to express some hematoma there is no evidence of pseudoaneurysm by physical exam no evidence of ongoing bleeding I placed a dry dressing.  I offered patient to proceed with right common femoral access today but he wants to wait and will reschedule for a nondialysis day in the near future which is certainly reasonable.  Lemar Livings, MD

## 2023-05-22 ENCOUNTER — Encounter (HOSPITAL_COMMUNITY)
Admission: RE | Disposition: A | Discharge status: Home / Self Care | Payer: Self-pay | Source: Home / Self Care | Attending: Vascular Surgery

## 2023-05-22 ENCOUNTER — Ambulatory Visit (HOSPITAL_COMMUNITY)
Admission: RE | Admit: 2023-05-22 | Discharge: 2023-05-22 | Disposition: A | Discharge status: Home / Self Care | Payer: 59 | Attending: Vascular Surgery | Admitting: Vascular Surgery

## 2023-05-22 ENCOUNTER — Other Ambulatory Visit: Payer: Self-pay

## 2023-05-22 DIAGNOSIS — Z992 Dependence on renal dialysis: Secondary | ICD-10-CM | POA: Insufficient documentation

## 2023-05-22 DIAGNOSIS — I70212 Atherosclerosis of native arteries of extremities with intermittent claudication, left leg: Secondary | ICD-10-CM | POA: Diagnosis present

## 2023-05-22 DIAGNOSIS — Z7901 Long term (current) use of anticoagulants: Secondary | ICD-10-CM | POA: Insufficient documentation

## 2023-05-22 DIAGNOSIS — Z87891 Personal history of nicotine dependence: Secondary | ICD-10-CM | POA: Insufficient documentation

## 2023-05-22 DIAGNOSIS — I890 Lymphedema, not elsewhere classified: Secondary | ICD-10-CM | POA: Insufficient documentation

## 2023-05-22 DIAGNOSIS — I70402 Unspecified atherosclerosis of autologous vein bypass graft(s) of the extremities, left leg: Secondary | ICD-10-CM

## 2023-05-22 DIAGNOSIS — I4891 Unspecified atrial fibrillation: Secondary | ICD-10-CM | POA: Insufficient documentation

## 2023-05-22 DIAGNOSIS — T82858A Stenosis of vascular prosthetic devices, implants and grafts, initial encounter: Secondary | ICD-10-CM

## 2023-05-22 HISTORY — PX: ABDOMINAL AORTOGRAM W/LOWER EXTREMITY: CATH118223

## 2023-05-22 HISTORY — PX: PERIPHERAL VASCULAR INTERVENTION: CATH118257

## 2023-05-22 LAB — POCT I-STAT, CHEM 8
BUN: 56 mg/dL — ABNORMAL HIGH (ref 8–23)
Calcium, Ion: 1.08 mmol/L — ABNORMAL LOW (ref 1.15–1.40)
Chloride: 99 mmol/L (ref 98–111)
Creatinine, Ser: 12.6 mg/dL — ABNORMAL HIGH (ref 0.61–1.24)
Glucose, Bld: 86 mg/dL (ref 70–99)
HCT: 28 % — ABNORMAL LOW (ref 39.0–52.0)
Hemoglobin: 9.5 g/dL — ABNORMAL LOW (ref 13.0–17.0)
Potassium: 4.4 mmol/L (ref 3.5–5.1)
Sodium: 139 mmol/L (ref 135–145)
TCO2: 32 mmol/L (ref 22–32)

## 2023-05-22 SURGERY — ABDOMINAL AORTOGRAM W/LOWER EXTREMITY
Anesthesia: LOCAL

## 2023-05-22 MED ORDER — ASPIRIN 81 MG PO CHEW
CHEWABLE_TABLET | ORAL | Status: DC | PRN
  Administered 2023-05-22: 81 mg via ORAL

## 2023-05-22 MED ORDER — OXYCODONE HCL 5 MG PO TABS: 5.0000 mg | ORAL_TABLET | ORAL | Status: DC | PRN

## 2023-05-22 MED ORDER — SODIUM CHLORIDE 0.9% FLUSH: 3.0000 mL | Freq: Two times a day (BID) | INTRAVENOUS | Status: DC

## 2023-05-22 MED ORDER — MIDAZOLAM HCL 2 MG/2ML IJ SOLN
INTRAMUSCULAR | Status: DC | PRN
  Administered 2023-05-22: 1 mg via INTRAVENOUS

## 2023-05-22 MED ORDER — ASPIRIN 81 MG PO TBEC: 81.0000 mg | DELAYED_RELEASE_TABLET | Freq: Every day | ORAL | Status: DC

## 2023-05-22 MED ORDER — MIDAZOLAM HCL 2 MG/2ML IJ SOLN
INTRAMUSCULAR | Status: AC
  Filled 2023-05-22: qty 2

## 2023-05-22 MED ORDER — LIDOCAINE HCL (PF) 1 % IJ SOLN
INTRAMUSCULAR | Status: DC | PRN
  Administered 2023-05-22: 15 mL

## 2023-05-22 MED ORDER — FENTANYL CITRATE (PF) 100 MCG/2ML IJ SOLN
INTRAMUSCULAR | Status: AC
  Filled 2023-05-22: qty 2

## 2023-05-22 MED ORDER — SODIUM CHLORIDE 0.9% FLUSH: 3.0000 mL | INTRAVENOUS | Status: DC | PRN

## 2023-05-22 MED ORDER — IODIXANOL 320 MG/ML IV SOLN
INTRAVENOUS | Status: DC | PRN
  Administered 2023-05-22: 65 mL

## 2023-05-22 MED ORDER — HYDRALAZINE HCL 20 MG/ML IJ SOLN: 5.0000 mg | INTRAMUSCULAR | Status: DC | PRN

## 2023-05-22 MED ORDER — LABETALOL HCL 5 MG/ML IV SOLN: 10.0000 mg | INTRAVENOUS | Status: DC | PRN

## 2023-05-22 MED ORDER — HYDRALAZINE HCL 20 MG/ML IJ SOLN
INTRAMUSCULAR | Status: AC
  Filled 2023-05-22: qty 1

## 2023-05-22 MED ORDER — MORPHINE SULFATE (PF) 2 MG/ML IV SOLN: 2.0000 mg | INTRAVENOUS | Status: DC | PRN

## 2023-05-22 MED ORDER — SODIUM CHLORIDE 0.9 % IV SOLN: 250.0000 mL | INTRAVENOUS | Status: DC | PRN

## 2023-05-22 MED ORDER — ONDANSETRON HCL 4 MG/2ML IJ SOLN: 4.0000 mg | Freq: Four times a day (QID) | INTRAMUSCULAR | Status: DC | PRN

## 2023-05-22 MED ORDER — LISINOPRIL 10 MG PO TABS
20.0000 mg | ORAL_TABLET | Freq: Once | ORAL | Status: AC
  Administered 2023-05-22: 20 mg via ORAL
  Filled 2023-05-22: qty 2

## 2023-05-22 MED ORDER — HEPARIN (PORCINE) IN NACL 1000-0.9 UT/500ML-% IV SOLN
INTRAVENOUS | Status: DC | PRN
  Administered 2023-05-22 (×2): 500 mL

## 2023-05-22 MED ORDER — FENTANYL CITRATE (PF) 100 MCG/2ML IJ SOLN
INTRAMUSCULAR | Status: DC | PRN
  Administered 2023-05-22: 50 ug via INTRAVENOUS

## 2023-05-22 MED ORDER — HYDRALAZINE HCL 20 MG/ML IJ SOLN
INTRAMUSCULAR | Status: DC | PRN
  Administered 2023-05-22 (×2): 20 mg via INTRAVENOUS

## 2023-05-22 MED ORDER — ACETAMINOPHEN 325 MG PO TABS: 650.0000 mg | ORAL_TABLET | ORAL | Status: DC | PRN

## 2023-05-22 MED ORDER — HEPARIN SODIUM (PORCINE) 1000 UNIT/ML IJ SOLN
INTRAMUSCULAR | Status: DC | PRN
  Administered 2023-05-22: 5000 [IU] via INTRAVENOUS

## 2023-05-22 MED ORDER — ASPIRIN 81 MG PO CHEW
CHEWABLE_TABLET | ORAL | Status: AC
  Filled 2023-05-22: qty 1

## 2023-05-22 MED ORDER — LIDOCAINE HCL (PF) 1 % IJ SOLN
INTRAMUSCULAR | Status: AC
  Filled 2023-05-22: qty 30

## 2023-05-22 MED ORDER — AMLODIPINE BESYLATE 5 MG PO TABS
10.0000 mg | ORAL_TABLET | Freq: Once | ORAL | Status: AC
  Administered 2023-05-22: 10 mg via ORAL
  Filled 2023-05-22: qty 2

## 2023-05-22 MED ORDER — METOPROLOL SUCCINATE ER 25 MG PO TB24
25.0000 mg | ORAL_TABLET | Freq: Once | ORAL | Status: AC
  Administered 2023-05-22: 25 mg via ORAL
  Filled 2023-05-22: qty 1

## 2023-05-22 SURGICAL SUPPLY — 20 items
BALLN MUSTANG 5X150X135 (BALLOONS) ×2
BALLOON MUSTANG 5X150X135 (BALLOONS) IMPLANT
CATH OMNI FLUSH 5F 65CM (CATHETERS) IMPLANT
CLOSURE MYNX CONTROL 6F/7F (Vascular Products) IMPLANT
GLIDEWIRE ADV .035X260CM (WIRE) IMPLANT
KIT ENCORE 26 ADVANTAGE (KITS) IMPLANT
KIT MICROPUNCTURE NIT STIFF (SHEATH) IMPLANT
KIT PV (KITS) ×3 IMPLANT
SHEATH CATAPULT 6FR 45 (SHEATH) IMPLANT
SHEATH PINNACLE 5F 10CM (SHEATH) IMPLANT
SHEATH PINNACLE 6F 10CM (SHEATH) IMPLANT
SHEATH PROBE COVER 6X72 (BAG) IMPLANT
STENT ELUVIA 6X150X130 (Permanent Stent) IMPLANT
STENT ELUVIA 6X80X130 (Permanent Stent) IMPLANT
STOPCOCK MORSE 400PSI 3WAY (MISCELLANEOUS) IMPLANT
SYR MEDRAD MARK 7 150ML (SYRINGE) ×3 IMPLANT
TRANSDUCER W/STOPCOCK (MISCELLANEOUS) ×3 IMPLANT
TRAY PV CATH (CUSTOM PROCEDURE TRAY) ×3 IMPLANT
TUBING CIL FLEX 10 FLL-RA (TUBING) IMPLANT
WIRE BENTSON .035X145CM (WIRE) IMPLANT

## 2023-05-22 NOTE — Op Note (Signed)
    Patient name: [name] MRN: [id number] DOB: 1951-01-11 Sex: male  05/22/2023 Pre-operative Diagnosis: Threatened left lower extremity bypass graft with high-grade SFA stenosis  Surgeon:  Luanna Salk. Randie Heinz, MD Procedure Performed: 1.  Percutaneous access and closure right common femoral artery Mynx device 2.  Aortogram with catheter selection of left SFA and popliteal arteries and left lower extremity angiogram 3.  Stent of left SFA with distal 6 x 150 mm Eluvia and proximal 6 x 80 mm Eluvia 4.  Moderate sedation with fentanyl and Versed for 25 minutes  Indications: 72 year old male with a history of bilateral extremity popliteal to distal posterior tibial artery bypass grafts with toe amputations.  Left side has evidence of inflow disease by both duplex and previous angiography and is now indicated for intervention of the left SFA.  Findings: Left SFA had moderate stenosis at least 60% in multiple areas and this was stented to less than 20% residual stenosis.  The runoff below the knee is the posterior tibial artery with bypass takeoff of the popliteal artery all the way to the distal posterior tibial at the ankle and this is patent with brisk flow throughout its course and filling the calcaneal branches and the foot   Procedure:  The patient was identified in the holding area and taken to room 7.  The patient was then placed supine on the table and prepped and draped in the usual sterile fashion.  A time out was called.  Ultrasound was used to evaluate the right common femoral artery and the area was anesthetized with 1% lidocaine and cannulated with a micropuncture needle followed by wire and a sheath.  An images saved the permanent record.  Concomitantly we administered Versed as needed sedation and his vital signs were monitored throughout the case.  Nursing.  We placed a Bentson wire followed by a 5 French sheath and Omni catheter to the level of L1 and aortogram was performed we then  crossed the bifurcation with Glidewire advantage and Omni cath and perform left lower extremity angiography in a staged fashion.  With the above findings we then placed a Glidewire advantage into the left SFA placed a long 6 French sheath up and over the bifurcation patient was fully heparinized.  We then crossed the several lesions of the SFA and popliteal and placed a wire below the knee and then primarily stented the SFA with distal 6 x 150 mm drug-eluting stent and proximal 6 x 80 mm drug-eluting stent postdilated with 5 mm balloon.  Completion demonstrated minimal residual stenosis with brisk runoff to the bypass graft.  Satisfied with this patient for a short 6 French sheath over the Glidewire advantage wire and then applied a minx device.  He tolerated the procedure without complication.  Contrast: 65 cc  Milburn Freeney C. Randie Heinz, MD [facility] Office: [phone] Pager: [phone]

## 2023-05-22 NOTE — H&P (Signed)
HPI [name] is a 72 y.o. (Mar 17, 1951) male who presents for surveillance of PAD.  He has a history of right popliteal to PT bypass with vein by Dr. Darrick Penna in 2006.  He also has a history of left popliteal to PT bypass with vein and left transmetatarsal amputation by Dr. Darrick Penna in 2022.  At his last follow-up with our office he had no new wounds and had no rest pain.   He returns today for follow-up.  He denies any recent changes to his health.  He denies any rest pain, claudication, nonhealing wounds of lower extremities.  He still has chronic leg swelling bilaterally due to lymphedema and elevates his legs regularly.   He is a former smoker.  He is on Eliquis for atrial fibrillation.  He is not on Pletal.  He does not vigorously exercise, however he endorses some walking daily.   He dialyzes on Tuesdays, Thursdays, and Saturdays.  He currently resides at [facility] nursing facility.         Current Outpatient Medications  Medication Sig Dispense Refill   acetaminophen (TYLENOL) 325 MG tablet Take 2 tablets (650 mg total) by mouth every 6 (six) hours as needed for mild pain (or Fever >/= 101).       Amino Acids-Protein Hydrolys (FEEDING SUPPLEMENT, PRO-STAT 64,) LIQD Take 30 mLs by mouth 3 (three) times daily with meals.       amLODipine (NORVASC) 10 MG tablet Take 10 mg by mouth daily.       apixaban (ELIQUIS) 5 MG TABS tablet Take 1 tablet (5 mg total) by mouth 2 (two) times daily. 60 tablet     atorvastatin (LIPITOR) 80 MG tablet Take 80 mg by mouth daily.       B Complex-C-Folic Acid (DIALYVITE 800) 0.8 MG TABS Take 1 tablet by mouth daily. (Patient not taking: Reported on 07/29/2022)       Darbepoetin Alfa (ARANESP) 100 MCG/0.5ML SOSY injection Inject 100 mcg into the skin. (Patient not taking: Reported on 07/29/2022)       docusate sodium (COLACE) 100 MG capsule Take 100 mg by mouth 2 (two) times daily. (Patient not taking: Reported on 07/29/2022)       glucagon (GLUCAGEN) 1  MG SOLR injection Inject 1 mg into the vein once as needed for low blood sugar. (Patient not taking: Reported on 07/29/2022)       levothyroxine (SYNTHROID) 50 MCG tablet Take 50 mcg by mouth daily before breakfast.       metoprolol succinate (TOPROL-XL) 25 MG 24 hr tablet Take 1 tablet (25 mg total) by mouth daily.       ondansetron (ZOFRAN) 4 MG tablet Take 1 tablet (4 mg total) by mouth every 6 (six) hours as needed for nausea. (Patient not taking: Reported on 07/29/2022) 20 tablet 0   pantoprazole (PROTONIX) 40 MG tablet Take 1 tablet (40 mg total) by mouth daily. (Patient not taking: Reported on 07/29/2022)       sucroferric oxyhydroxide (VELPHORO) 500 MG chewable tablet Chew 1 tablet (500 mg total) by mouth 3 (three) times daily with meals. 90 tablet     vitamin C (ASCORBIC ACID) 500 MG tablet Take 500 mg by mouth daily.       zinc sulfate 220 (50 Zn) MG capsule Take 220 mg by mouth daily. (Patient not taking: Reported on 07/29/2022)        No current facility-administered medications for this visit.  REVIEW OF SYSTEMS (negative unless checked):    Cardiac:  []  Chest pain or chest pressure? []  Shortness of breath upon activity? []  Shortness of breath when lying flat? []  Irregular heart rhythm?   Vascular:  []  Pain in calf, thigh, or hip brought on by walking? []  Pain in feet at night that wakes you up from your sleep? []  Blood clot in your veins? [x]  Leg swelling?   Pulmonary:  []  Oxygen at home? []  Productive cough? []  Wheezing?   Neurologic:  []  Sudden weakness in arms or legs? []  Sudden numbness in arms or legs? []  Sudden onset of difficult speaking or slurred speech? []  Temporary loss of vision in one eye? []  Problems with dizziness?   Gastrointestinal:  []  Blood in stool? []  Vomited blood?   Genitourinary:  []  Burning when urinating? []  Blood in urine?   Psychiatric:  []  Major depression   Hematologic:  []  Bleeding problems? []  Problems with blood  clotting?   Dermatologic:  []  Rashes or ulcers?   Constitutional:  []  Fever or chills?   Ear/Nose/Throat:  []  Change in hearing? []  Nose bleeds? []  Sore throat?   Musculoskeletal:  []  Back pain? []  Joint pain? []  Muscle pain?     Physical Examination    Vitals:   05/22/23 1142 05/22/23 1146  BP:  (!) 214/76  Pulse: 76 81  Resp:  16  Temp:  99 F (37.2 C)  SpO2:  92%      General:  WDWN in NAD; vital signs documented above Gait: Not observed HENT: WNL, normocephalic Pulmonary: normal non-labored breathing  Cardiac: regular HR, without murmurs without carotid bruit Abdomen: soft, NT, no masses Skin: without rashes Vascular Exam/Pulses: Palpable femoral pulses bilaterally.  Nonpalpable popliteal or pedal pulses bilaterally.  Monophasic right PT/DP Doppler signals.  Biphasic left PT and monophasic left DP Doppler signals. Healed left TMA. Extremities: without ischemic changes, without gangrene , without cellulitis; without open wounds;  Musculoskeletal: no muscle wasting or atrophy       Neurologic: A&O X 3;  No focal weakness or paresthesias are detected Psychiatric:  The pt has Normal affect.   Non-Invasive Vascular Imaging ABI (03/31/2023) R:  ABI: [place] (1.08),  PT: mono DP: mono TBI:  0.23 (0.31) L:  ABI: 1.0 ([place]),  PT: bi DP: mono TBI: 1.00 (0.91)   LLE Bypass Duplex (03/31/2023) LEFT      PSV cm/sRatioStenosis       Waveform Comments  +----------+--------+-----+---------------+---------+--------+  CFA Prox  105                         biphasic           +----------+--------+-----+---------------+---------+--------+  DFA      129                         biphasic           +----------+--------+-----+---------------+---------+--------+  SFA Prox  149                         biphasic           +----------+--------+-----+---------------+---------+--------+  SFA Mid   303          50-74% stenosistriphasic           +----------+--------+-----+---------------+---------+--------+  SFA Distal251          50-74% stenosistriphasic          +----------+--------+-----+---------------+---------+--------+  Left Graft #1: BK pop to PTA  +--------------------+--------+---------------+--------+--------+                     PSV cm/sStenosis       WaveformComments  +--------------------+--------+---------------+--------+--------+  Inflow             120                    biphasic          +--------------------+--------+---------------+--------+--------+  Proximal Anastomosis230     50-70% stenosisbiphasic          +--------------------+--------+---------------+--------+--------+  Proximal Graft      101                    biphasic          +--------------------+--------+---------------+--------+--------+  Mid Graft           72                     biphasic          +--------------------+--------+---------------+--------+--------+  Distal Graft        95                     biphasic          +--------------------+--------+---------------+--------+--------+  Distal Anastomosis  122                    biphasic          +--------------------+--------+---------------+--------+--------+  Outflow            132                    biphasic            RLE Bypass Duplex (03/31/2023) --------+--------+-----+---------------+----------+--------+  RIGHT    PSV cm/sRatioStenosis       Waveform  Comments  +----------+--------+-----+---------------+----------+--------+  CFA Prox  149                         monophasic          +----------+--------+-----+---------------+----------+--------+  DFA      129                         biphasic            +----------+--------+-----+---------------+----------+--------+  SFA Prox  143                         biphasic            +----------+--------+-----+---------------+----------+--------+  SFA Mid    227          50-74% stenosisbiphasic            +----------+--------+-----+---------------+----------+--------+  SFA Distal345          50-74% stenosisbiphasic            +----------+--------+-----+---------------+----------+--------+     Right Graft #1: BK pop to PTA  +------------------+--------+-------------+----------+--------+                   PSV cm/sStenosis     Waveform  Comments  +------------------+--------+-------------+----------+--------+  Inflow           105                  monophasic          +------------------+--------+-------------+----------+--------+  Prox Anastomosis  121                  monophasic          +------------------+--------+-------------+----------+--------+  Proximal Graft    41                   monophasic          +------------------+--------+-------------+----------+--------+  Mid Graft         45                   monophasic          +------------------+--------+-------------+----------+--------+  Distal Graft      724     >70% stenosismonophasic          +------------------+--------+-------------+----------+--------+  Distal Anastomosis96                   monophasic          +------------------+--------+-------------+----------+--------+  Outflow          43                   monophasic          +------------------+--------+-------------+----------+--------+   A/P 72 year old male with threatened bilateral lower extremity bypass graft.  Plan is for stenting of his left SFA today in the Cath Lab.  Mandel Seiden C. Randie Heinz, MD [facility] Office: [phone] Pager: [phone]

## 2023-05-22 NOTE — Progress Notes (Signed)
Called report to [place], Charity fundraiser at [facility]

## 2023-05-23 ENCOUNTER — Encounter (HOSPITAL_COMMUNITY): Payer: Self-pay | Admitting: Vascular Surgery

## 2023-05-25 ENCOUNTER — Telehealth: Payer: Self-pay

## 2023-05-25 NOTE — Telephone Encounter (Signed)
Tameka from [facility] called to let us know pt is c/o leg pain since his AGM Monday. She was unable to give me much more information, other than he is having pain and walking less. He had to go to HD today in a wheelchair. MD is aware. Advised to continue to monitor pain and he can report to ED if she feels necessary. Tameka verbalized understanding of the above.

## 2023-05-26 ENCOUNTER — Telehealth: Payer: Self-pay | Admitting: Vascular Surgery

## 2023-05-26 NOTE — Telephone Encounter (Signed)
-----   Message from Maeola Harman, MD sent at 05/22/2023  2:19 PM EDT ----- [name] [id number] 1951-04-10  05/22/2023 Pre-operative Diagnosis: Threatened left lower extremity bypass graft with high-grade SFA stenosis   Surgeon:  Apolinar Junes C. Randie Heinz, MD  Procedure Performed: 1.  Percutaneous access and closure right common femoral artery Mynx device 2.  Aortogram with catheter selection of left SFA and popliteal arteries and left lower extremity angiogram 3.  Stent of left SFA with distal 6 x 150 mm Eluvia and proximal 6 x 80 mm Eluvia 4.  Moderate sedation with fentanyl and Versed for 25 minutes  Follow-up with PA or me in 4 to 6 weeks with extremity arterial duplexes and ABIs.  Apolinar Junes

## 2023-06-14 ENCOUNTER — Encounter (HOSPITAL_COMMUNITY): Payer: Self-pay

## 2023-06-19 ENCOUNTER — Encounter: Payer: Self-pay | Admitting: Podiatry

## 2023-06-19 ENCOUNTER — Ambulatory Visit (INDEPENDENT_AMBULATORY_CARE_PROVIDER_SITE_OTHER): Payer: 59 | Admitting: Podiatry

## 2023-06-19 VITALS — BP 208/92

## 2023-06-19 DIAGNOSIS — E1169 Type 2 diabetes mellitus with other specified complication: Secondary | ICD-10-CM | POA: Diagnosis not present

## 2023-06-19 DIAGNOSIS — B351 Tinea unguium: Secondary | ICD-10-CM

## 2023-06-19 DIAGNOSIS — E1142 Type 2 diabetes mellitus with diabetic polyneuropathy: Secondary | ICD-10-CM | POA: Diagnosis not present

## 2023-06-19 DIAGNOSIS — L84 Corns and callosities: Secondary | ICD-10-CM

## 2023-06-19 NOTE — Progress Notes (Signed)
This patient presents to the office with chief complaint of long thick nails and diabetic feet.  This patient  says there  is  no pain and discomfort in his  feet.  This patient says there are long thick painful nails.  These nails are painful walking and wearing shoes.  Patient has no history of infection or drainage from both feet.  Patient is unable to  self treat his own nails .He has history odf amputation of toes 2-5 left foot and fifth toe right foot. This patient presents  to the office today for treatment of the  long nails and a foot evaluation due to history of  diabetes.  General Appearance  Alert, conversant and in no acute stress.  Vascular  Dorsalis pedis and posterior tibial  pulses are absent  bilaterally.  Capillary return is within normal limits  bilaterally. Cold feet  bilaterally.  Neurologic  Senn-Weinstein monofilament wire test diminished  bilaterally. Muscle power within normal limits bilaterally.  Nails Thick disfigured discolored nails with subungual debris  from hallux nail left foot and 1-4 right foot. No evidence of bacterial infection or drainage bilaterally.  Orthopedic  No limitations of motion of motion feet .  No crepitus or effusions noted.  Amputation 2-5 left foot and 5 right foot.  Skin  normotropic skin with no porokeratosis noted bilaterally.  Pre-ulcerous lesion sub 5th right foot.  Onychomycosis  Diabetes with no foot complications  Pre-ulcerous callus right foot.  IE  Debride nails x 5.  Debride callus right foot with nail nipper and dremel tool.  A diabetic foot exam was performed and there is evidence of vascular and neurologic pathology pathology.   RTC 3 months.   Helane Gunther DPM

## 2023-06-21 NOTE — Telephone Encounter (Signed)
Patient states that he is doing fine at this time. Would like to wait a while before scheduling post op appointment. Informed patient to give [place] a call back once he is ready to schedule.

## 2023-09-20 ENCOUNTER — Emergency Department (HOSPITAL_COMMUNITY): Payer: 59

## 2023-09-20 ENCOUNTER — Other Ambulatory Visit: Payer: Self-pay

## 2023-09-20 ENCOUNTER — Encounter (HOSPITAL_COMMUNITY): Payer: Self-pay

## 2023-09-20 ENCOUNTER — Emergency Department (HOSPITAL_COMMUNITY)
Admission: EM | Admit: 2023-09-20 | Discharge: 2023-09-21 | Disposition: A | Discharge status: Home / Self Care | Payer: 59 | Attending: Emergency Medicine | Admitting: Emergency Medicine

## 2023-09-20 DIAGNOSIS — I6782 Cerebral ischemia: Secondary | ICD-10-CM | POA: Insufficient documentation

## 2023-09-20 DIAGNOSIS — S0502XA Injury of conjunctiva and corneal abrasion without foreign body, left eye, initial encounter: Secondary | ICD-10-CM | POA: Insufficient documentation

## 2023-09-20 DIAGNOSIS — W06XXXA Fall from bed, initial encounter: Secondary | ICD-10-CM | POA: Insufficient documentation

## 2023-09-20 LAB — CBC WITH DIFFERENTIAL/PLATELET
Abs Immature Granulocytes: 0.06 10*3/uL (ref 0.00–0.07)
Basophils Absolute: 0 10*3/uL (ref 0.0–0.1)
Basophils Relative: 0 %
Eosinophils Absolute: 0.5 10*3/uL (ref 0.0–0.5)
Eosinophils Relative: 6 %
HCT: 33.2 % — ABNORMAL LOW (ref 39.0–52.0)
Hemoglobin: 10.6 g/dL — ABNORMAL LOW (ref 13.0–17.0)
Immature Granulocytes: 1 %
Lymphocytes Relative: 16 %
Lymphs Abs: 1.5 10*3/uL (ref 0.7–4.0)
MCH: 28.6 pg (ref 26.0–34.0)
MCHC: 31.9 g/dL (ref 30.0–36.0)
MCV: 89.7 fL (ref 80.0–100.0)
Monocytes Absolute: 0.8 10*3/uL (ref 0.1–1.0)
Monocytes Relative: 8 %
Neutro Abs: 6.7 10*3/uL (ref 1.7–7.7)
Neutrophils Relative %: 69 %
Platelets: 195 10*3/uL (ref 150–400)
RBC: 3.7 MIL/uL — ABNORMAL LOW (ref 4.22–5.81)
RDW: 18.9 % — ABNORMAL HIGH (ref 11.5–15.5)
WBC: 9.6 10*3/uL (ref 4.0–10.5)
nRBC: 0 % (ref 0.0–0.2)

## 2023-09-20 LAB — COMPREHENSIVE METABOLIC PANEL
ALT: 18 U/L (ref 0–44)
AST: 16 U/L (ref 15–41)
Albumin: 3.2 g/dL — ABNORMAL LOW (ref 3.5–5.0)
Alkaline Phosphatase: 55 U/L (ref 38–126)
Anion gap: 14 (ref 5–15)
BUN: 25 mg/dL — ABNORMAL HIGH (ref 8–23)
CO2: 30 mmol/L (ref 22–32)
Calcium: 9.3 mg/dL (ref 8.9–10.3)
Chloride: 93 mmol/L — ABNORMAL LOW (ref 98–111)
Creatinine, Ser: 8.18 mg/dL — ABNORMAL HIGH (ref 0.61–1.24)
GFR, Estimated: 6 mL/min — ABNORMAL LOW (ref >60)
Glucose, Bld: 120 mg/dL — ABNORMAL HIGH (ref 70–99)
Potassium: 4 mmol/L (ref 3.5–5.1)
Sodium: 137 mmol/L (ref 135–145)
Total Bilirubin: 0.7 mg/dL (ref 0.3–1.2)
Total Protein: 7.9 g/dL (ref 6.5–8.1)

## 2023-09-20 MED ORDER — ERYTHROMYCIN 5 MG/GM OP OINT
1.0000 | TOPICAL_OINTMENT | Freq: Once | OPHTHALMIC | Status: AC
  Administered 2023-09-20: 1 via OPHTHALMIC
  Filled 2023-09-20: qty 3.5

## 2023-09-20 MED ORDER — LOSARTAN POTASSIUM 50 MG PO TABS
25.0000 mg | ORAL_TABLET | Freq: Every evening | ORAL | Status: DC
  Administered 2023-09-20: 25 mg via ORAL
  Filled 2023-09-20: qty 1

## 2023-09-20 MED ORDER — METOPROLOL SUCCINATE ER 25 MG PO TB24: 25.0000 mg | ORAL_TABLET | Freq: Every day | ORAL | Status: DC

## 2023-09-20 MED ORDER — AMLODIPINE BESYLATE 5 MG PO TABS
10.0000 mg | ORAL_TABLET | Freq: Every day | ORAL | Status: DC
  Administered 2023-09-20: 10 mg via ORAL
  Filled 2023-09-20: qty 2

## 2023-09-20 MED ORDER — ERYTHROMYCIN 5 MG/GM OP OINT: TOPICAL_OINTMENT | OPHTHALMIC | 0 refills | Status: DC

## 2023-09-20 MED ORDER — ACETAMINOPHEN 325 MG PO TABS: 650.0000 mg | ORAL_TABLET | Freq: Four times a day (QID) | ORAL | Status: DC | PRN

## 2023-09-20 NOTE — ED Provider Notes (Signed)
[place] EMERGENCY DEPARTMENT AT [facility] Provider Note   CSN: [id number] Arrival date & time: 09/20/23  1451     History Chief Complaint  Patient presents with   Fall   Eye Injury    HPI [name] is a 72 y.o. male presenting for chief complaint of ground-level fall.  72 year old male.   Patient's recorded medical, surgical, social, medication list and allergies were reviewed in the Snapshot window as part of the initial history.   Review of Systems   Review of Systems  Constitutional:  Negative for chills and fever.  HENT:  Negative for ear pain and sore throat.   Eyes:  Negative for pain and visual disturbance.  Respiratory:  Negative for cough and shortness of breath.   Cardiovascular:  Negative for chest pain and palpitations.  Gastrointestinal:  Negative for abdominal pain and vomiting.  Genitourinary:  Negative for dysuria and hematuria.  Musculoskeletal:  Negative for arthralgias and back pain.  Skin:  Negative for color change and rash.  Neurological:  Negative for seizures and syncope.  All other systems reviewed and are negative.   Physical Exam Updated Vital Signs BP (!) 184/96   Pulse 70   Temp 98.1 F (36.7 C) (Oral)   Resp 19   Ht 5\' 10"  (1.778 m)   Wt 85.3 kg   SpO2 98%   BMI 26.98 kg/m  Physical Exam Vitals and nursing note reviewed.  Constitutional:      General: He is not in acute distress.    Appearance: He is well-developed.  HENT:     Head: Normocephalic and atraumatic.  Eyes:     Conjunctiva/sclera: Conjunctivae normal.  Cardiovascular:     Rate and Rhythm: Normal rate and regular rhythm.     Heart sounds: No murmur heard. Pulmonary:     Effort: Pulmonary effort is normal. No respiratory distress.     Breath sounds: Normal breath sounds.  Abdominal:     General: Abdomen is flat. There is no distension.     Palpations: Abdomen is soft.     Tenderness: There is no abdominal tenderness.  Musculoskeletal:         General: Signs of injury (Subconjunctival erythema of the left eye.  Otherwise no acute pathology on his exam.) present. No swelling or deformity.     Cervical back: Neck supple.  Skin:    General: Skin is warm and dry.     Capillary Refill: Capillary refill takes less than 2 seconds.  Neurological:     Mental Status: He is alert and oriented to person, place, and time. Mental status is at baseline.  Psychiatric:        Mood and Affect: Mood normal.      ED Course/ Medical Decision Making/ A&P    Procedures Procedures   Medications Ordered in ED Medications  erythromycin ophthalmic ointment 1 Application (has no administration in time range)    Medical Decision Making:   This is 72 year old male who is a fall on thinners from his nursing facility.  He has no acute complaints besides some discomfort in his left eye.  States that he always has pain in this left eye it has always injected and he states he has chronic irritation states that he follows with ophthalmology for cataracts and that he had a recent procedure.  He has substantial subconjunctival hemorrhage all around the eye but no obvious abnormality.  Visual acuity is intact on my exam.  Observed in emergency  department for 4 and half hours without any acute worsening.  CT scans were performed evaluate intracranial or facial injury and there is no obvious abnormality on the scans.  Cervical spine was included due to his age and fall mechanism. Patient observed for extended time.  at mental status baseline requesting discharge home on this assessment.  Will discharge patient back to his nursing facility for ongoing care management with strict return precautions regarding interval worsening.  Given the interval worsening of subconjunctival hemorrhage, will treat with erythromycin ointment until he follows up with ophthalmology ideally within 48 hours per my conversation with the patient.  Disposition:  I have considered need for  hospitalization, however, considering all of the above, I believe this patient is stable for discharge at this time.  Patient/family educated about specific return precautions for given chief complaint and symptoms.  Patient/family educated about follow-up with PCP.     Patient/family expressed understanding of return precautions and need for follow-up. Patient spoken to regarding all imaging and laboratory results and appropriate follow up for these results. All education provided in verbal form with additional information in written form. Time was allowed for answering of patient questions. Patient discharged.    Emergency Department Medication Summary:   Medications  erythromycin ophthalmic ointment 1 Application (has no administration in time range)        Clinical Impression:  1. Abrasion of left cornea, initial encounter      Discharge   Final Clinical Impression(s) / ED Diagnoses Final diagnoses:  Abrasion of left cornea, initial encounter    Rx / DC Orders ED Discharge Orders          Ordered    erythromycin ophthalmic ointment        09/20/23 1925              Glyn Ade, MD 09/20/23 1925

## 2023-09-20 NOTE — ED Notes (Signed)
Patient transported to CT with RN on monitor.

## 2023-09-20 NOTE — Progress Notes (Signed)
Orthopedic Tech Progress Note Patient Details:  [name] 1951/07/12 [id number]  Patient ID: [name], male   DOB: 04/13/1951, 72 y.o.   MRN: [id number] I attended trauma page. [name] 09/20/2023, 3:04 PM

## 2023-09-20 NOTE — ED Triage Notes (Signed)
Patient bib [facility] from his [facility]. EMS reports patient rolled out of bed last night while he was sleeping and hit the left side of his face. Facility had been monitoring him throughout today and sent him for evaluation because his left eye has had increased swelling and redness. Patient was ambulating on EMS arrival to scene. Only complaints are of left eye pain when looking a certain direction.   PMH: dialysis on Tuesday, Thursday, and Saturday he has a fistula in the left arm.

## 2023-09-20 NOTE — ED Notes (Signed)
The pt was waiting for ptar to go back to the nursing home  when ptar arrived they took his bp and reported that they could not take this pt back to the nursing home because his bp was too high.   They reported  they would return  if his bp came down

## 2023-09-20 NOTE — ED Notes (Signed)
Pt asking how long before he leaves   I explained about his bp and that we needed to get his bp down before he leaves  med given  he had missed several  meds when he  came in here

## 2023-09-21 ENCOUNTER — Ambulatory Visit: Payer: 59 | Admitting: Podiatry

## 2023-09-21 DIAGNOSIS — Z992 Dependence on renal dialysis: Secondary | ICD-10-CM

## 2023-09-21 DIAGNOSIS — B351 Tinea unguium: Secondary | ICD-10-CM

## 2023-09-21 DIAGNOSIS — N186 End stage renal disease: Secondary | ICD-10-CM

## 2023-09-21 DIAGNOSIS — E1169 Type 2 diabetes mellitus with other specified complication: Secondary | ICD-10-CM

## 2023-09-21 DIAGNOSIS — E1142 Type 2 diabetes mellitus with diabetic polyneuropathy: Secondary | ICD-10-CM

## 2023-09-21 NOTE — ED Notes (Signed)
Home with ptar 

## 2023-09-21 NOTE — ED Notes (Signed)
Ptar recalled to return to take t     his pt home

## 2023-09-21 NOTE — Progress Notes (Signed)
This patient presents to the office with chief complaint of long thick nails and diabetic feet.  This patient  says there  is  no pain and discomfort in his  feet.  This patient says there are long thick painful nails.  These nails are painful walking and wearing shoes.  Patient has no history of infection or drainage from both feet.  Patient is unable to  self treat his own nails .He has history odf amputation of toes 2-5 left foot and fifth toe right foot. This patient presents  to the office today for treatment of the  long nails and a foot evaluation due to history of  diabetes.  General Appearance  Alert, conversant and in no acute stress.  Vascular  Dorsalis pedis and posterior tibial  pulses are absent  bilaterally.  Capillary return is within normal limits  bilaterally. Cold feet  bilaterally.  Neurologic  Senn-Weinstein monofilament wire test diminished  bilaterally. Muscle power within normal limits bilaterally.  Nails Thick disfigured discolored nails with subungual debris  from hallux nail left foot and 1-4 right foot. No evidence of bacterial infection or drainage bilaterally.  Orthopedic  No limitations of motion of motion feet .  No crepitus or effusions noted.  Amputation 2-5 left foot and 5 right foot.  Skin  normotropic skin with no porokeratosis noted bilaterally.  Pre-ulcerous lesion sub 5th right foot.  Onychomycosis  Diabetes with no foot complications  Pre-ulcerous callus right foot.  IE  Debride nails x 5.  Amputation digits  B/L.  RTC 3 months.   Helane Gunther DPM

## 2023-12-25 ENCOUNTER — Ambulatory Visit: Payer: 59 | Admitting: Podiatry

## 2024-02-02 ENCOUNTER — Ambulatory Visit (HOSPITAL_COMMUNITY)
Admission: RE | Admit: 2024-02-02 | Discharge: 2024-02-02 | Disposition: A | Discharge status: Skilled Nursing Facility | Payer: 59 | Attending: Internal Medicine | Admitting: Internal Medicine

## 2024-02-02 ENCOUNTER — Encounter (HOSPITAL_COMMUNITY)
Admission: RE | Disposition: A | Discharge status: Skilled Nursing Facility | Payer: Self-pay | Source: Home / Self Care | Attending: Internal Medicine

## 2024-02-02 ENCOUNTER — Encounter (HOSPITAL_COMMUNITY): Payer: Self-pay | Admitting: Internal Medicine

## 2024-02-02 ENCOUNTER — Other Ambulatory Visit: Payer: Self-pay

## 2024-02-02 DIAGNOSIS — T82898A Other specified complication of vascular prosthetic devices, implants and grafts, initial encounter: Secondary | ICD-10-CM | POA: Diagnosis present

## 2024-02-02 DIAGNOSIS — I132 Hypertensive heart and chronic kidney disease with heart failure and with stage 5 chronic kidney disease, or end stage renal disease: Secondary | ICD-10-CM | POA: Diagnosis not present

## 2024-02-02 DIAGNOSIS — E1122 Type 2 diabetes mellitus with diabetic chronic kidney disease: Secondary | ICD-10-CM | POA: Diagnosis not present

## 2024-02-02 DIAGNOSIS — Z794 Long term (current) use of insulin: Secondary | ICD-10-CM | POA: Insufficient documentation

## 2024-02-02 DIAGNOSIS — I5032 Chronic diastolic (congestive) heart failure: Secondary | ICD-10-CM | POA: Diagnosis not present

## 2024-02-02 DIAGNOSIS — K219 Gastro-esophageal reflux disease without esophagitis: Secondary | ICD-10-CM | POA: Diagnosis not present

## 2024-02-02 DIAGNOSIS — Z992 Dependence on renal dialysis: Secondary | ICD-10-CM | POA: Insufficient documentation

## 2024-02-02 DIAGNOSIS — R0600 Dyspnea, unspecified: Secondary | ICD-10-CM | POA: Diagnosis not present

## 2024-02-02 DIAGNOSIS — Z87891 Personal history of nicotine dependence: Secondary | ICD-10-CM | POA: Diagnosis not present

## 2024-02-02 DIAGNOSIS — Y832 Surgical operation with anastomosis, bypass or graft as the cause of abnormal reaction of the patient, or of later complication, without mention of misadventure at the time of the procedure: Secondary | ICD-10-CM | POA: Diagnosis not present

## 2024-02-02 DIAGNOSIS — Z79899 Other long term (current) drug therapy: Secondary | ICD-10-CM | POA: Diagnosis not present

## 2024-02-02 DIAGNOSIS — N186 End stage renal disease: Secondary | ICD-10-CM | POA: Diagnosis not present

## 2024-02-02 DIAGNOSIS — R5381 Other malaise: Secondary | ICD-10-CM | POA: Diagnosis not present

## 2024-02-02 HISTORY — PX: A/V FISTULAGRAM: CATH118298

## 2024-02-02 LAB — POCT I-STAT, CHEM 8
BUN: 41 mg/dL — ABNORMAL HIGH (ref 8–23)
Calcium, Ion: 1.09 mmol/L — ABNORMAL LOW (ref 1.15–1.40)
Chloride: 101 mmol/L (ref 98–111)
Creatinine, Ser: 8.8 mg/dL — ABNORMAL HIGH (ref 0.61–1.24)
Glucose, Bld: 79 mg/dL (ref 70–99)
HCT: 30 % — ABNORMAL LOW (ref 39.0–52.0)
Hemoglobin: 10.2 g/dL — ABNORMAL LOW (ref 13.0–17.0)
Potassium: 4.2 mmol/L (ref 3.5–5.1)
Sodium: 139 mmol/L (ref 135–145)
TCO2: 29 mmol/L (ref 22–32)

## 2024-02-02 SURGERY — A/V FISTULAGRAM
Anesthesia: LOCAL

## 2024-02-02 MED ORDER — LIDOCAINE HCL (PF) 1 % IJ SOLN
INTRAMUSCULAR | Status: DC | PRN
  Administered 2024-02-02: 2 mL via SUBCUTANEOUS

## 2024-02-02 MED ORDER — LIDOCAINE HCL (PF) 1 % IJ SOLN
INTRAMUSCULAR | Status: AC
  Filled 2024-02-02: qty 30

## 2024-02-02 MED ORDER — HEPARIN (PORCINE) IN NACL 1000-0.9 UT/500ML-% IV SOLN
INTRAVENOUS | Status: DC | PRN
  Administered 2024-02-02: 500 mL

## 2024-02-02 MED ORDER — IODIXANOL 320 MG/ML IV SOLN
INTRAVENOUS | Status: DC | PRN
  Administered 2024-02-02: 9 mL via INTRAVENOUS

## 2024-02-02 SURGICAL SUPPLY — 5 items
COVER DOME SNAP 22 D (MISCELLANEOUS) ×2 IMPLANT
GUIDEWIRE ANGLED .035 180CM (WIRE) IMPLANT
SHEATH PINNACLE R/O II 6F 4CM (SHEATH) IMPLANT
TRAY PV CATH (CUSTOM PROCEDURE TRAY) ×2 IMPLANT
WIRE MICRO SET SILHO 5FR 7 (SHEATH) IMPLANT

## 2024-02-02 NOTE — H&P (Signed)
  [facility]  Vascular Access Procedure H&P  Reason for Consultation: Low AF Requesting Provider: Dr. Marisue Humble  HPI: [name] is an 73 y.o. male with ESRD on HD since ~2012 (TTS [facility]), PAD, DM, GERD, debility, HFpEF among other PMH listed below who presents for dialysis access dysfunction.   HD unit notes declining AF and inability to run at prescribed BFR recently.  He has a L RC AVF but doesn't recall when it was created.    He has chronic dyspnea and feels better than usual today.   PMH: Past Medical History:  Diagnosis Date   Arthritis    Asthma    as a child   Chronic cystitis    Chronic diastolic heart failure (HCC)    Chronic kidney disease    HD pt, 3 times a week.   Diabetes mellitus    Dysrhythmia    GERD (gastroesophageal reflux disease)    H/O hiatal hernia    states it's been fixed   Hyperlipidemia    Hypertension    Lymphedema    Morbid obesity (HCC)    NEPHROLITHIASIS, HX OF 12/02/2009   Qualifier: Diagnosis of  By: [name], Cat     PVD (peripheral vascular disease) (HCC)    Renal insufficiency    UMBILICAL HERNIA 02/08/2007   Qualifier: History of  By: Pearletha Forge MD, Shane     Venous insufficiency    PSH: Past Surgical History:  Procedure Laterality Date   ABDOMINAL AORTOGRAM W/LOWER EXTREMITY N/A 05/19/2017   Procedure: Abdominal Aortogram w/Lower Extremity;  Surgeon: Sherren Kerns, MD;  Location: [facility] INVASIVE CV LAB;  Service: Cardiovascular;  Laterality: N/A;   ABDOMINAL AORTOGRAM W/LOWER EXTREMITY N/A 06/18/2021   Procedure: ABDOMINAL AORTOGRAM W/LOWER EXTREMITY;  Surgeon: Sherren Kerns, MD;  Location: [facility] INVASIVE CV LAB;  Service: Cardiovascular;  Laterality: N/A;   ABDOMINAL AORTOGRAM W/LOWER EXTREMITY N/A 04/17/2023   Procedure: ABDOMINAL AORTOGRAM W/LOWER EXTREMITY;  Surgeon: Maeola Harman, MD;  Location[facility] INVASIVE CV LAB;  Service: Cardiovascular;  Laterality: N/A;   ABDOMINAL AORTOGRAM W/LOWER EXTREMITY N/A  05/22/2023   Procedure: ABDOMINAL AORTOGRAM W/LOWER EXTREMITY;  Surgeon: Maeola Harman, MD;  Location: [facility] INVASIVE CV LAB;  Service: Cardiovascular;  Laterality: N/A;   AMPUTATION     Right and left fifth toes.    AMPUTATION Left 06/18/2021   Procedure: Amputation transmetatarsal of toes 2 3 and 4 left foot ;  Surgeon: Sherren Kerns, MD;  Location: [facility] OR;  Service: Vascular;  Laterality: Left;   AMPUTATION TOE     emoval of both little toes   AV FISTULA PLACEMENT Left 03/21/2014   Procedure: ARTERIOVENOUS (AV) FISTULA CREATION with ultrasound;  Surgeon: Larina Earthly, MD;  Location: [facility] OR;  Service: Vascular;  Laterality: Left;   COLONOSCOPY     EYE SURGERY Bilateral    cataract and lens implant   FEMORAL-TIBIAL BYPASS GRAFT Left 06/18/2021   Procedure: Left below-knee popliteal to posterior tibial artery bypass with 9 reversed left greater saphenous vein;  Surgeon: Sherren Kerns, MD;  Location: [facility] OR;  Service: Vascular;  Laterality: Left;   HERNIA REPAIR     LIGATION OF COMPETING BRANCHES OF ARTERIOVENOUS FISTULA Left 06/29/2015   Procedure: LIGATION OF LEFT ARM RADIOCEPHALIC ARTERIOVENOUS FISTULA SIDE BRANCHES;  Surgeon: Fransisco Hertz, MD;  Location: [facility] OR;  Service: Vascular;  Laterality: Left;   MULTIPLE EXTRACTIONS WITH ALVEOLOPLASTY N/A 04/07/2017   Procedure: Extraction of tooth #'s 1-11, 13, 14,16, 20-23,  and 26-28 with alveoloplasty;  Surgeon: Charlynne Pander, DDS;  Location: [facility] OR;  Service: Oral Surgery;  Laterality: N/A;   PERIPHERAL VASCULAR INTERVENTION Right 04/17/2023   Procedure: PERIPHERAL VASCULAR INTERVENTION;  Surgeon: Maeola Harman, MD;  Location: [facility] INVASIVE CV LAB;  Service: Cardiovascular;  Laterality: Right;  Right SFA Stent   PERIPHERAL VASCULAR INTERVENTION Left 05/22/2023   Procedure: PERIPHERAL VASCULAR INTERVENTION;  Surgeon: Maeola Harman, MD;  Location: [facility] INVASIVE CV LAB;  Service: Cardiovascular;  Laterality: Left;  SFA    Popliteal to posterior tibial bypass     2006   R knee arthoscopic repair of meniscus     UMBILICAL HERNIA REPAIR     Past Medical History:  Diagnosis Date   Arthritis    Asthma    as a child   Chronic cystitis    Chronic diastolic heart failure (HCC)    Chronic kidney disease    HD pt, 3 times a week.   Diabetes mellitus    Dysrhythmia    GERD (gastroesophageal reflux disease)    H/O hiatal hernia    states it's been fixed   Hyperlipidemia    Hypertension    Lymphedema    Morbid obesity (HCC)    NEPHROLITHIASIS, HX OF 12/02/2009   Qualifier: Diagnosis of  By: [name], Cat     PVD (peripheral vascular disease) (HCC)    Renal insufficiency    UMBILICAL HERNIA 02/08/2007   Qualifier: History of  By: Pearletha Forge MD, Shane     Venous insufficiency     Medications:  I have reviewed the patient's current medications.  Facility-Administered Medications Prior to Admission  Medication Dose Route Frequency Provider Last Rate Last Admin   0.9 %  sodium chloride infusion  250 mL Intravenous PRN Maeola Harman, MD       0.9 %  sodium chloride infusion  250 mL Intravenous PRN Maeola Harman, MD       0.9 %  sodium chloride infusion  250 mL Intravenous PRN Maeola Harman, MD       sodium chloride flush (NS) 0.9 % injection 3 mL  3 mL Intravenous Q12H Maeola Harman, MD       sodium chloride flush (NS) 0.9 % injection 3 mL  3 mL Intravenous Q12H Maeola Harman, MD       sodium chloride flush (NS) 0.9 % injection 3 mL  3 mL Intravenous Q12H Maeola Harman, MD       Medications Prior to Admission  Medication Sig Dispense Refill   acetaminophen (TYLENOL) 325 MG tablet Take 2 tablets (650 mg total) by mouth every 6 (six) hours as needed for mild pain (or Fever >/= 101).     amLODipine (NORVASC) 10 MG tablet Take 10 mg by mouth at bedtime.     apixaban (ELIQUIS) 5 MG TABS tablet Take 1 tablet (5 mg total) by mouth 2 (two) times  daily. 60 tablet    aspirin 81 MG chewable tablet Chew 81 mg by mouth daily.     atorvastatin (LIPITOR) 80 MG tablet Take 80 mg by mouth at bedtime.     docusate sodium (COLACE) 100 MG capsule Take 100 mg by mouth daily.     erythromycin ophthalmic ointment Place a 1/2 inch ribbon of ointment into the lower eyelid. 3.5 g 0   glucagon (GLUCAGEN) 1 MG SOLR injection Inject 1 mg into the vein daily as needed for low blood sugar.  guaiFENesin (ROBITUSSIN) 100 MG/5ML liquid Take 15 mLs by mouth every 4 (four) hours as needed for cough or to loosen phlegm.     insulin glargine (LANTUS SOLOSTAR) 100 UNIT/ML Solostar Pen Inject 4 Units into the skin 2 (two) times daily.     levothyroxine (SYNTHROID) 50 MCG tablet Take 50 mcg by mouth daily before breakfast.     lisinopril (ZESTRIL) 20 MG tablet Take 20 mg by mouth See admin instructions. Take every Mon., Wed., Fri., and Sun.     losartan (COZAAR) 25 MG tablet Take 25 mg by mouth every evening.     metoprolol succinate (TOPROL-XL) 25 MG 24 hr tablet Take 1 tablet (25 mg total) by mouth daily.     ondansetron (ZOFRAN) 4 MG tablet Take 1 tablet (4 mg total) by mouth every 6 (six) hours as needed for nausea. 20 tablet 0   sevelamer carbonate (RENVELA) 800 MG tablet Take 1,600 mg by mouth 3 (three) times daily with meals.      ALLERGIES:   Allergies  Allergen Reactions   Shellfish Allergy Swelling    FAM HX: Family History  Problem Relation Age of Onset   Diabetes Mother    Heart disease Mother    Diabetes Brother     Social History:   reports that he quit smoking about 44 years ago. His smoking use included cigarettes. He has quit using smokeless tobacco. He reports that he does not drink alcohol and does not use drugs.  ROS: 12 system ROS neg except per HPI  Blood pressure (!) 187/74, pulse 63, resp. rate 18, height 5\' 10"  (1.778 m), weight 86.1 kg, SpO2 95%. PHYSICAL EXAM: Gen: chronically ill and frail but nontoxic  ENT: edentulous,  tongue covered with dark stains - he says phos binder related, class 4 airway Neck: supple CV:  RRR Lungs: normal WOB, sats normal on RA, clear laterally Extr: chronic woody edema noted, stockings in place, L RC AVF good augmentation and collapse, some collaterals present.  Oozing when I remove yesterdays dressing Neuro: conversant    No results found for this or any previous visit (from the past 48 hours).  No results found.  Assessment/PlanSherman A [name] is an 73 y.o. male with ESRD on HD since ~2012 (TTS [facility]), PAD, DM, GERD, debility, HFpEF among other PMH listed below who presents for dialysis access dysfunction.   **ESRD with HD access dysfunction:  Pt with low flows, inability to reach rx'd BFR recently.  Having some prolonged bleeding as well.  Exam fairly benign but with the myriad of issues he's having an angiogram is indicated.   We will plan to hold sedation unless an intervention/angioplasty is needed.  We discussed risk/benefit/alternative and he elects to proceed.  He does not have labs within the last 30d and multiple attempts to collect in the pre op area were unsuccess.  Plan is to insert sheath and run istat chem panel at the initiation of procedure prior to any meds/contrast administration.    Tyler Pita 02/02/2024, 9:37 AM

## 2024-02-02 NOTE — Discharge Instructions (Signed)
 General care instructions: - You should be able to eat, drink, and resume your normal medications. - Avoid any strenuous activity for the remainder of the day. Potential complications: - Your hand is more cold or numb than usual. - You are bleeding at the site and it will not stop with direct pressure. If it was a declot expect some oozing at the site. Avoid extreme pressure to the site. - You have a change in the bruit and /or thrill in your fistula or graft. - You have a fever, swelling, see redness or feel heat at or near the puncture site. Medication instructions: - Continue routine medications unless otherwise instructed. 4. Please have your sutures removed at your next scheduled dialysis treatment.

## 2024-02-02 NOTE — Op Note (Addendum)
 Pt referred for evaluation of RC AVF for declining AF and inability to run at rx'd BFR.  On exam he has a well developed L RC AVF with collaterals present.  Augmentation and collapse are good.   Summary:  1)      The body of the cephalic vein fistula was patent with good flows. 2)    Multiple collaterals of the forearm fistula vein, some are sizeable and near the inflow.  3)      The fistula outflow is through nice sized basilic and axillary veins.  The centrals were widely patent. 4)      This left BCF remains amenable to future percutaneous intervention.  Description of procedure: The arm was prepped and draped in the usual sterile fashion. The left arm radial cephalic fistula was cannulated (16109) with a 21G micropuncture needle directed in an antegrade direction. A guidewire was inserted and exchanged for a 6Fr sheath. Contrast [phone]) injection via the side port of the sheath was performed. The angiogram of the fistula (09811) showed an patent body of the left RC AVF with collaterals present.  Upper arm outflow through a widely patent basilic and axillary vein.  The left centrals were patent.  Attempts to view the inflow retrograde were limited by numerous collaterals present.  The physical exam of augmentation was good.   Hemostasis: A 3-0 ethilon purse string suture was placed at the cannulation site on removal of the sheath.  Sedation: none given   Contrast. 8 mL  Monitoring: Because of the patient's comorbid conditions and sedation during the procedure, continuous EKG monitoring and O2 saturation monitoring was performed throughout the procedure by the RN. There were no abnormal arrhythmias encountered.  Complications: None.   Diagnoses: Z99.2 dependence on dialysis N18.6 ESRD  Procedure Coding:  36901 Cannulation and angiogram of fistula Q9967 Contrast  Recommendations:  1. Continue to cannulate the fistula with 15G needles.  2. If flows continue to decline and having  arterial pressure alarms send back for inflow angiogram.  His physical exam today did not suggest that.   3. Remove the suture next treatment.   Discharge: The patient was discharged home in stable condition. The patient was given education regarding the care of the dialysis access AVF and specific instructions in case of any problems.

## 2024-04-05 ENCOUNTER — Observation Stay (HOSPITAL_COMMUNITY)

## 2024-04-05 ENCOUNTER — Emergency Department (HOSPITAL_COMMUNITY)

## 2024-04-05 ENCOUNTER — Encounter (HOSPITAL_COMMUNITY): Payer: Self-pay

## 2024-04-05 ENCOUNTER — Other Ambulatory Visit: Payer: Self-pay

## 2024-04-05 ENCOUNTER — Inpatient Hospital Stay (HOSPITAL_COMMUNITY)
Admission: EM | Admit: 2024-04-05 | Discharge: 2024-04-18 | DRG: 186 | Disposition: A | Discharge status: Skilled Nursing Facility | Source: Skilled Nursing Facility | Attending: Internal Medicine | Admitting: Internal Medicine

## 2024-04-05 DIAGNOSIS — D631 Anemia in chronic kidney disease: Secondary | ICD-10-CM | POA: Diagnosis present

## 2024-04-05 DIAGNOSIS — G8929 Other chronic pain: Secondary | ICD-10-CM | POA: Diagnosis present

## 2024-04-05 DIAGNOSIS — K219 Gastro-esophageal reflux disease without esophagitis: Secondary | ICD-10-CM | POA: Diagnosis present

## 2024-04-05 DIAGNOSIS — E1122 Type 2 diabetes mellitus with diabetic chronic kidney disease: Secondary | ICD-10-CM | POA: Diagnosis present

## 2024-04-05 DIAGNOSIS — Z95820 Peripheral vascular angioplasty status with implants and grafts: Secondary | ICD-10-CM

## 2024-04-05 DIAGNOSIS — I132 Hypertensive heart and chronic kidney disease with heart failure and with stage 5 chronic kidney disease, or end stage renal disease: Secondary | ICD-10-CM | POA: Diagnosis not present

## 2024-04-05 DIAGNOSIS — E8809 Other disorders of plasma-protein metabolism, not elsewhere classified: Secondary | ICD-10-CM | POA: Diagnosis present

## 2024-04-05 DIAGNOSIS — I4891 Unspecified atrial fibrillation: Secondary | ICD-10-CM | POA: Diagnosis present

## 2024-04-05 DIAGNOSIS — I358 Other nonrheumatic aortic valve disorders: Secondary | ICD-10-CM | POA: Diagnosis present

## 2024-04-05 DIAGNOSIS — L309 Dermatitis, unspecified: Secondary | ICD-10-CM | POA: Diagnosis present

## 2024-04-05 DIAGNOSIS — Z7901 Long term (current) use of anticoagulants: Secondary | ICD-10-CM

## 2024-04-05 DIAGNOSIS — I951 Orthostatic hypotension: Secondary | ICD-10-CM | POA: Diagnosis present

## 2024-04-05 DIAGNOSIS — Z91013 Allergy to seafood: Secondary | ICD-10-CM

## 2024-04-05 DIAGNOSIS — I440 Atrioventricular block, first degree: Secondary | ICD-10-CM | POA: Diagnosis present

## 2024-04-05 DIAGNOSIS — J189 Pneumonia, unspecified organism: Secondary | ICD-10-CM | POA: Diagnosis present

## 2024-04-05 DIAGNOSIS — N185 Chronic kidney disease, stage 5: Secondary | ICD-10-CM

## 2024-04-05 DIAGNOSIS — E11319 Type 2 diabetes mellitus with unspecified diabetic retinopathy without macular edema: Secondary | ICD-10-CM | POA: Diagnosis present

## 2024-04-05 DIAGNOSIS — Z7401 Bed confinement status: Secondary | ICD-10-CM

## 2024-04-05 DIAGNOSIS — I5032 Chronic diastolic (congestive) heart failure: Secondary | ICD-10-CM | POA: Diagnosis present

## 2024-04-05 DIAGNOSIS — E1151 Type 2 diabetes mellitus with diabetic peripheral angiopathy without gangrene: Secondary | ICD-10-CM | POA: Diagnosis present

## 2024-04-05 DIAGNOSIS — W19XXXA Unspecified fall, initial encounter: Secondary | ICD-10-CM

## 2024-04-05 DIAGNOSIS — E039 Hypothyroidism, unspecified: Secondary | ICD-10-CM | POA: Diagnosis present

## 2024-04-05 DIAGNOSIS — Z87891 Personal history of nicotine dependence: Secondary | ICD-10-CM

## 2024-04-05 DIAGNOSIS — Z993 Dependence on wheelchair: Secondary | ICD-10-CM

## 2024-04-05 DIAGNOSIS — Z91158 Patient's noncompliance with renal dialysis for other reason: Secondary | ICD-10-CM

## 2024-04-05 DIAGNOSIS — E876 Hypokalemia: Secondary | ICD-10-CM | POA: Diagnosis present

## 2024-04-05 DIAGNOSIS — Y844 Aspiration of fluid as the cause of abnormal reaction of the patient, or of later complication, without mention of misadventure at the time of the procedure: Secondary | ICD-10-CM | POA: Diagnosis not present

## 2024-04-05 DIAGNOSIS — R627 Adult failure to thrive: Secondary | ICD-10-CM | POA: Diagnosis present

## 2024-04-05 DIAGNOSIS — J869 Pyothorax without fistula: Secondary | ICD-10-CM | POA: Diagnosis present

## 2024-04-05 DIAGNOSIS — Z6822 Body mass index (BMI) 22.0-22.9, adult: Secondary | ICD-10-CM

## 2024-04-05 DIAGNOSIS — J90 Pleural effusion, not elsewhere classified: Secondary | ICD-10-CM | POA: Diagnosis not present

## 2024-04-05 DIAGNOSIS — E871 Hypo-osmolality and hyponatremia: Secondary | ICD-10-CM | POA: Diagnosis present

## 2024-04-05 DIAGNOSIS — Z89421 Acquired absence of other right toe(s): Secondary | ICD-10-CM

## 2024-04-05 DIAGNOSIS — E785 Hyperlipidemia, unspecified: Secondary | ICD-10-CM | POA: Diagnosis present

## 2024-04-05 DIAGNOSIS — E875 Hyperkalemia: Secondary | ICD-10-CM | POA: Diagnosis not present

## 2024-04-05 DIAGNOSIS — D649 Anemia, unspecified: Secondary | ICD-10-CM

## 2024-04-05 DIAGNOSIS — I872 Venous insufficiency (chronic) (peripheral): Secondary | ICD-10-CM | POA: Diagnosis present

## 2024-04-05 DIAGNOSIS — L7622 Postprocedural hemorrhage and hematoma of skin and subcutaneous tissue following other procedure: Secondary | ICD-10-CM | POA: Diagnosis not present

## 2024-04-05 DIAGNOSIS — J9811 Atelectasis: Secondary | ICD-10-CM | POA: Diagnosis present

## 2024-04-05 DIAGNOSIS — R54 Age-related physical debility: Secondary | ICD-10-CM | POA: Diagnosis present

## 2024-04-05 DIAGNOSIS — Z992 Dependence on renal dialysis: Secondary | ICD-10-CM

## 2024-04-05 DIAGNOSIS — Z8249 Family history of ischemic heart disease and other diseases of the circulatory system: Secondary | ICD-10-CM

## 2024-04-05 DIAGNOSIS — M898X9 Other specified disorders of bone, unspecified site: Secondary | ICD-10-CM | POA: Diagnosis present

## 2024-04-05 DIAGNOSIS — Z7982 Long term (current) use of aspirin: Secondary | ICD-10-CM

## 2024-04-05 DIAGNOSIS — W06XXXA Fall from bed, initial encounter: Secondary | ICD-10-CM | POA: Diagnosis present

## 2024-04-05 DIAGNOSIS — Z89422 Acquired absence of other left toe(s): Secondary | ICD-10-CM

## 2024-04-05 DIAGNOSIS — N2581 Secondary hyperparathyroidism of renal origin: Secondary | ICD-10-CM | POA: Diagnosis present

## 2024-04-05 DIAGNOSIS — I890 Lymphedema, not elsewhere classified: Secondary | ICD-10-CM | POA: Diagnosis present

## 2024-04-05 DIAGNOSIS — Z79890 Hormone replacement therapy (postmenopausal): Secondary | ICD-10-CM

## 2024-04-05 DIAGNOSIS — Z7984 Long term (current) use of oral hypoglycemic drugs: Secondary | ICD-10-CM

## 2024-04-05 DIAGNOSIS — R519 Headache, unspecified: Secondary | ICD-10-CM | POA: Diagnosis present

## 2024-04-05 DIAGNOSIS — Z833 Family history of diabetes mellitus: Secondary | ICD-10-CM

## 2024-04-05 DIAGNOSIS — Z79899 Other long term (current) drug therapy: Secondary | ICD-10-CM

## 2024-04-05 DIAGNOSIS — N186 End stage renal disease: Secondary | ICD-10-CM | POA: Diagnosis present

## 2024-04-05 LAB — PROTEIN, PLEURAL OR PERITONEAL FLUID: Total protein, fluid: 3.7 g/dL

## 2024-04-05 LAB — COMPREHENSIVE METABOLIC PANEL WITH GFR
ALT: 8 U/L (ref 0–44)
AST: 10 U/L — ABNORMAL LOW (ref 15–41)
Albumin: 2.9 g/dL — ABNORMAL LOW (ref 3.5–5.0)
Alkaline Phosphatase: 35 U/L — ABNORMAL LOW (ref 38–126)
Anion gap: 10 (ref 5–15)
BUN: 23 mg/dL (ref 8–23)
CO2: 30 mmol/L (ref 22–32)
Calcium: 9.2 mg/dL (ref 8.9–10.3)
Chloride: 98 mmol/L (ref 98–111)
Creatinine, Ser: 6.37 mg/dL — ABNORMAL HIGH (ref 0.61–1.24)
GFR, Estimated: 9 mL/min — ABNORMAL LOW (ref >60)
Glucose, Bld: 79 mg/dL (ref 70–99)
Potassium: 3.3 mmol/L — ABNORMAL LOW (ref 3.5–5.1)
Sodium: 138 mmol/L (ref 135–145)
Total Bilirubin: 0.5 mg/dL (ref 0.0–1.2)
Total Protein: 6.2 g/dL — ABNORMAL LOW (ref 6.5–8.1)

## 2024-04-05 LAB — CBC
HCT: 40.2 % (ref 39.0–52.0)
Hemoglobin: 12.2 g/dL — ABNORMAL LOW (ref 13.0–17.0)
MCH: 28.9 pg (ref 26.0–34.0)
MCHC: 30.3 g/dL (ref 30.0–36.0)
MCV: 95.3 fL (ref 80.0–100.0)
Platelets: 146 K/uL — ABNORMAL LOW (ref 150–400)
RBC: 4.22 MIL/uL (ref 4.22–5.81)
RDW: 16.9 % — ABNORMAL HIGH (ref 11.5–15.5)
WBC: 5.8 K/uL (ref 4.0–10.5)
nRBC: 0 % (ref 0.0–0.2)

## 2024-04-05 LAB — BASIC METABOLIC PANEL WITH GFR
Anion gap: 11 (ref 5–15)
BUN: 20 mg/dL (ref 8–23)
CO2: 29 mmol/L (ref 22–32)
Calcium: 8.8 mg/dL — ABNORMAL LOW (ref 8.9–10.3)
Chloride: 96 mmol/L — ABNORMAL LOW (ref 98–111)
Creatinine, Ser: 5.48 mg/dL — ABNORMAL HIGH (ref 0.61–1.24)
GFR, Estimated: 10 mL/min — ABNORMAL LOW
Glucose, Bld: 73 mg/dL (ref 70–99)
Potassium: 3.5 mmol/L (ref 3.5–5.1)
Sodium: 136 mmol/L (ref 135–145)

## 2024-04-05 LAB — BODY FLUID CELL COUNT WITH DIFFERENTIAL
Eos, Fluid: 3 %
Lymphs, Fluid: 91 %
Monocyte-Macrophage-Serous Fluid: 4 % — ABNORMAL LOW (ref 50–90)
Neutrophil Count, Fluid: 2 % (ref 0–25)
Total Nucleated Cell Count, Fluid: 3908 uL — ABNORMAL HIGH (ref 0–1000)

## 2024-04-05 LAB — AMYLASE, PLEURAL OR PERITONEAL FLUID: Amylase, Fluid: 35 U/L

## 2024-04-05 LAB — GLUCOSE, PLEURAL OR PERITONEAL FLUID: Glucose, Fluid: 33 mg/dL

## 2024-04-05 LAB — ALBUMIN, PLEURAL OR PERITONEAL FLUID: Albumin, Fluid: 1.8 g/dL

## 2024-04-05 LAB — TSH: TSH: 2.926 u[IU]/mL (ref 0.350–4.500)

## 2024-04-05 LAB — GLUCOSE, CAPILLARY: Glucose-Capillary: 117 mg/dL — ABNORMAL HIGH (ref 70–99)

## 2024-04-05 LAB — LACTATE DEHYDROGENASE, PLEURAL OR PERITONEAL FLUID: LD, Fluid: 170 U/L — ABNORMAL HIGH (ref 3–23)

## 2024-04-05 MED ORDER — LEVOTHYROXINE SODIUM 25 MCG PO TABS: 50.0000 ug | ORAL_TABLET | Freq: Every day | ORAL | Status: DC

## 2024-04-05 MED ORDER — ASPIRIN 81 MG PO CHEW
81.0000 mg | CHEWABLE_TABLET | Freq: Every day | ORAL | Status: DC
  Administered 2024-04-06 – 2024-04-18 (×11): 81 mg via ORAL
  Filled 2024-04-05 (×12): qty 1

## 2024-04-05 MED ORDER — SUCROFERRIC OXYHYDROXIDE 500 MG PO CHEW
1000.0000 mg | CHEWABLE_TABLET | Freq: Three times a day (TID) | ORAL | Status: DC
  Administered 2024-04-06 – 2024-04-12 (×10): 1000 mg via ORAL
  Filled 2024-04-05 (×19): qty 2

## 2024-04-05 MED ORDER — ACETAMINOPHEN 325 MG PO TABS
650.0000 mg | ORAL_TABLET | Freq: Four times a day (QID) | ORAL | Status: DC | PRN
  Administered 2024-04-10: 650 mg via ORAL
  Filled 2024-04-05: qty 2

## 2024-04-05 MED ORDER — LIDOCAINE HCL 1 % IJ SOLN
20.0000 mL | Freq: Once | INTRAMUSCULAR | Status: AC
  Administered 2024-04-05: 10 mL

## 2024-04-05 MED ORDER — HYDRALAZINE HCL 25 MG PO TABS
25.0000 mg | ORAL_TABLET | Freq: Three times a day (TID) | ORAL | Status: DC
  Administered 2024-04-05 – 2024-04-18 (×30): 25 mg via ORAL
  Filled 2024-04-05 (×32): qty 1

## 2024-04-05 MED ORDER — ATORVASTATIN CALCIUM 80 MG PO TABS
80.0000 mg | ORAL_TABLET | Freq: Every day | ORAL | Status: DC
  Administered 2024-04-05 – 2024-04-16 (×8): 80 mg via ORAL
  Filled 2024-04-05 (×11): qty 1

## 2024-04-05 MED ORDER — LEVOTHYROXINE SODIUM 50 MCG PO TABS
50.0000 ug | ORAL_TABLET | Freq: Every day | ORAL | Status: DC
  Administered 2024-04-06 – 2024-04-18 (×10): 50 ug via ORAL
  Filled 2024-04-05 (×12): qty 1

## 2024-04-05 MED ORDER — APIXABAN 5 MG PO TABS
5.0000 mg | ORAL_TABLET | Freq: Two times a day (BID) | ORAL | Status: DC
  Administered 2024-04-05 – 2024-04-06 (×2): 5 mg via ORAL
  Filled 2024-04-05 (×2): qty 1

## 2024-04-05 MED ORDER — LISINOPRIL 20 MG PO TABS
40.0000 mg | ORAL_TABLET | Freq: Every day | ORAL | Status: DC
  Administered 2024-04-05 – 2024-04-17 (×11): 40 mg via ORAL
  Filled 2024-04-05 (×13): qty 2

## 2024-04-05 NOTE — ED Triage Notes (Signed)
 BIB EMS from [facility] for unwitnessed fall out of bed. Pt reports hitting posterior head. Denis LOC. On eliquis . Facility initial BP 78/42, BP currently 131/50. Pt AOx3

## 2024-04-05 NOTE — ED Notes (Signed)
 Trauma Response Nurse Documentation   [name] is a 73 y.o. male arriving to [facility] ED via EMS  On Eliquis  (apixaban ) daily. Trauma was activated as a Level 2 by Dr. Monnie Anthony based on the following trauma criteria Elderly patients > 65 with head trauma on anti-coagulation (excluding ASA).  Patient cleared for CT by Dr. Monnie Anthony. Pt transported to CT with primary nurse present to monitor. RN remained with the patient throughout their absence from the department for clinical observation.   GCS 15.    History   Past Medical History:  Diagnosis Date   Arthritis    Asthma    as a child   Chronic cystitis    Chronic diastolic heart failure (HCC)    Chronic kidney disease    HD pt, 3 times a week.   Diabetes mellitus    Dysrhythmia    GERD (gastroesophageal reflux disease)    H/O hiatal hernia    states it's been fixed   Hyperlipidemia    Hypertension    Lymphedema    Morbid obesity (HCC)    NEPHROLITHIASIS, HX OF 12/02/2009   Qualifier: Diagnosis of  By: [name], Cat     PVD (peripheral vascular disease) (HCC)    Renal insufficiency    UMBILICAL HERNIA 02/08/2007   Qualifier: History of  By: Peggy Bowens MD, Shane     Venous insufficiency      Past Surgical History:  Procedure Laterality Date   A/V FISTULAGRAM N/A 02/02/2024   Procedure: A/V Fistulagram;  Surgeon: Baron Border, MD;  Location: [facility] INVASIVE CV LAB;  Service: Cardiovascular;  Laterality: N/A;   ABDOMINAL AORTOGRAM W/LOWER EXTREMITY N/A 05/19/2017   Procedure: Abdominal Aortogram w/Lower Extremity;  Surgeon: Richrd Char, MD;  Location: [facility] INVASIVE CV LAB;  Service: Cardiovascular;  Laterality: N/A;   ABDOMINAL AORTOGRAM W/LOWER EXTREMITY N/A 06/18/2021   Procedure: ABDOMINAL AORTOGRAM W/LOWER EXTREMITY;  Surgeon: Richrd Char, MD;  Location: [facility] INVASIVE CV LAB;  Service: Cardiovascular;  Laterality: N/A;   ABDOMINAL AORTOGRAM W/LOWER EXTREMITY N/A 04/17/2023   Procedure: ABDOMINAL AORTOGRAM W/LOWER  EXTREMITY;  Surgeon: Adine Hoof, MD;  Location: [facility] INVASIVE CV LAB;  Service: Cardiovascular;  Laterality: N/A;   ABDOMINAL AORTOGRAM W/LOWER EXTREMITY N/A 05/22/2023   Procedure: ABDOMINAL AORTOGRAM W/LOWER EXTREMITY;  Surgeon: Adine Hoof, MD;  Location: [facility] INVASIVE CV LAB;  Service: Cardiovascular;  Laterality: N/A;   AMPUTATION     Right and left fifth toes.    AMPUTATION Left 06/18/2021   Procedure: Amputation transmetatarsal of toes 2 3 and 4 left foot ;  Surgeon: Richrd Char, MD;  Location: [facility] OR;  Service: Vascular;  Laterality: Left;   AMPUTATION TOE     emoval of both little toes   AV FISTULA PLACEMENT Left 03/21/2014   Procedure: ARTERIOVENOUS (AV) FISTULA CREATION with ultrasound;  Surgeon: Mayo Speck, MD;  Location: [facility] OR;  Service: Vascular;  Laterality: Left;   COLONOSCOPY     EYE SURGERY Bilateral    cataract and lens implant   FEMORAL-TIBIAL BYPASS GRAFT Left 06/18/2021   Procedure: Left below-knee popliteal to posterior tibial artery bypass with 9 reversed left greater saphenous vein;  Surgeon: Richrd Char, MD;  Location: [facility] OR;  Service: Vascular;  Laterality: Left;   HERNIA REPAIR     LIGATION OF COMPETING BRANCHES OF ARTERIOVENOUS FISTULA Left 06/29/2015   Procedure: LIGATION OF LEFT ARM RADIOCEPHALIC ARTERIOVENOUS FISTULA SIDE BRANCHES;  Surgeon: Arvil Lauber, MD;  Location: [facility] OR;  Service: Vascular;  Laterality: Left;   MULTIPLE EXTRACTIONS WITH ALVEOLOPLASTY N/A 04/07/2017   Procedure: Extraction of tooth #'s 1-11, 13, 14,16, 20-23, and 26-28 with alveoloplasty;  Surgeon: Carol Chroman, DDS;  Location: [facility] OR;  Service: Oral Surgery;  Laterality: N/A;   PERIPHERAL VASCULAR INTERVENTION Right 04/17/2023   Procedure: PERIPHERAL VASCULAR INTERVENTION;  Surgeon: Adine Hoof, MD;  Location: [facility] INVASIVE CV LAB;  Service: Cardiovascular;  Laterality: Right;  Right SFA Stent   PERIPHERAL VASCULAR INTERVENTION Left 05/22/2023    Procedure: PERIPHERAL VASCULAR INTERVENTION;  Surgeon: Adine Hoof, MD;  Location: [facility] INVASIVE CV LAB;  Service: Cardiovascular;  Laterality: Left;  SFA   Popliteal to posterior tibial bypass     2006   R knee arthoscopic repair of meniscus     UMBILICAL HERNIA REPAIR         Initial Focused Assessment (If applicable, or please see trauma documentation): Airway - Clear Breathing - unlabored Circ - no external bleeding noted GCS -15 with some confusion to specific times  CT's Completed:   CT Head   Interventions:  Labs Xrays CT scans Admit to medicine  Plan for disposition:  Admission to floor   Consults completed:  none   Event Summary:  See EDP note  Asa Bjork  Trauma Response RN  Please call TRN at [phone] for further assistance.

## 2024-04-05 NOTE — ED Provider Notes (Signed)
 [facility] [facility] EMERGENCY DEPARTMENT AT [place] Provider Note   CSN: [id number] Arrival date & time: 04/05/24  2130     History  Chief Complaint  Patient presents with   [name] is a 73 y.o. male.   Fall     Patient resides in a nursing facility.  He has a history of diabetes, hypertension, hyperlipidemia, venous insufficiency, chronic kidney disease, heart failure.  Patient is also on chronic anticoagulation.  Patient presented to the ED for evaluation after a fall.  Patient states he fell out of bed this morning accidentally.  He denies having any trouble with any weakness.  No fevers or chills.  He did not lose consciousness.  Patient states he has a mild headache.  Home Medications Prior to Admission medications   Medication Sig Start Date End Date Taking? Authorizing Provider  acetaminophen  (TYLENOL ) 325 MG tablet Take 650 mg by mouth every 6 (six) hours as needed for mild pain (pain score 1-3) or fever.   Yes [provider]  apixaban  (ELIQUIS ) 5 MG TABS tablet Take 1 tablet (5 mg total) by mouth 2 (two) times daily. 06/27/21  Yes Montey Apa, DO  aspirin  81 MG chewable tablet Chew 81 mg by mouth daily.   Yes [provider]  atorvastatin  (LIPITOR ) 80 MG tablet Take 80 mg by mouth at bedtime.   Yes [provider]  docusate sodium  (COLACE) 100 MG capsule Take 100 mg by mouth daily.   Yes [provider]  glucagon (GLUCAGEN) 1 MG SOLR injection Inject 1 mg into the vein daily as needed for low blood sugar.   Yes [provider]  guaiFENesin  (ROBITUSSIN) 100 MG/5ML liquid Take 15 mLs by mouth every 4 (four) hours as needed for cough or to loosen phlegm.   Yes [provider]  hydrALAZINE  (APRESOLINE ) 25 MG tablet Take 25 mg by mouth in the morning and at bedtime.   Yes [provider]  levothyroxine (SYNTHROID) 50 MCG tablet Take 50 mcg by mouth daily before breakfast.   Yes [provider]  lisinopril  (ZESTRIL ) 20 MG tablet Take 20 mg by mouth See admin instructions. Take 20 mg by mouth once daily on Monday, Wednesday, Friday, and Sunday.   Yes [provider]  lisinopril  (ZESTRIL ) 40 MG tablet Take 40 mg by mouth at bedtime.   Yes [provider]  metoprolol  succinate (TOPROL -XL) 25 MG 24 hr tablet Take 1 tablet (25 mg total) by mouth daily. 06/28/21  Yes Montey Apa, DO  Multiple Vitamins-Minerals (MULTIVITAMIN WITH MINERALS) tablet Take 1 tablet by mouth daily in the afternoon.   Yes [provider]  sucroferric oxyhydroxide (VELPHORO ) 500 MG chewable tablet Chew 1,000 mg by mouth 3 (three) times daily with meals.   Yes [provider]      Allergies    Shellfish allergy and Shrimp (diagnostic)    Review of Systems   Review of Systems  Physical Exam Updated Vital Signs BP (!) 127/59   Pulse 68   Temp 97.8 F (36.6 C) (Oral)   Resp 14   Ht 1.778 m (5\' 10" )   Wt 86.1 kg   SpO2 100%   BMI 27.24 kg/m  Physical Exam Vitals and nursing note reviewed.  Constitutional:      Appearance: He is well-developed. He is ill-appearing.  HENT:     Head: Normocephalic.     Comments: Mild tenderness palpation posterior occiput  Right Ear: External ear normal.     Left Ear: External ear normal.  Eyes:     General: No scleral icterus.       Right eye: No discharge.        Left eye: No discharge.     Conjunctiva/sclera: Conjunctivae normal.  Neck:     Trachea: No tracheal deviation.  Cardiovascular:     Rate and Rhythm: Normal rate and regular rhythm.  Pulmonary:     Effort: Pulmonary effort is normal. No respiratory distress.     Breath sounds: Normal breath sounds. No stridor. No wheezing or rales.  Abdominal:     General: Bowel sounds are normal. There is no distension.     Palpations: Abdomen is soft.     Tenderness: There is no abdominal tenderness. There is no guarding or rebound.  Musculoskeletal:         General: No tenderness or deformity.     Cervical back: Neck supple.     Right lower leg: Edema present.     Left lower leg: Edema present.     Comments: No ulcerations noted, status post amputation of the digits of the left foot, thickened skin and scaling and flaking bilaterally lower extremities; full range of motion bilateral upper extremities and lower extremities, no tenderness palpation bilateral upper extremities and lower extremities, no tender palpation thoracic or lumbar spine, no tenderness palpation cervical spine  Skin:    General: Skin is warm and dry.     Findings: No rash.  Neurological:     General: No focal deficit present.     Mental Status: He is alert.     Cranial Nerves: No cranial nerve deficit, dysarthria or facial asymmetry.     Sensory: No sensory deficit.     Motor: No abnormal muscle tone or seizure activity.     Coordination: Coordination normal.  Psychiatric:        Mood and Affect: Mood normal.     ED Results / Procedures / Treatments   Labs (all labs ordered are listed, but only abnormal results are displayed) Labs Reviewed  CBC - Abnormal; Notable for the following components:      Result Value   Hemoglobin 12.2 (*)    RDW 16.9 (*)    Platelets 146 (*)    All other components within normal limits  BASIC METABOLIC PANEL WITH GFR - Abnormal; Notable for the following components:   Chloride 96 (*)    Creatinine, Ser 5.48 (*)    Calcium  8.8 (*)    GFR, Estimated 10 (*)    All other components within normal limits    EKG EKG Interpretation Date/Time:  Friday April 05 2024 09:02:35 EDT Ventricular Rate:  69 PR Interval:  225 QRS Duration:  103 QT Interval:  406 QTC Calculation: 435 R Axis:   50  Text Interpretation: Sinus rhythm Prolonged PR interval LVH with secondary repolarization abnormality Baseline wander in lead(s) V3 t wave changes ineror and lateral compared to prior ECG Confirmed by Trish Furl [phone]) on 04/05/2024 9:14:16  AM  Radiology CT Head Wo Contrast Result Date: 04/05/2024 CLINICAL DATA:  Minor head trauma, fall on blood thinner EXAM: CT HEAD WITHOUT CONTRAST CT CERVICAL SPINE WITHOUT CONTRAST TECHNIQUE: Multidetector CT imaging of the head and cervical spine was performed following the standard protocol without intravenous contrast. Multiplanar CT image reconstructions of the cervical spine were also generated. RADIATION DOSE REDUCTION: This exam was performed according to the departmental dose-optimization program which  includes automated exposure control, adjustment of the mA and/or kV according to patient size and/or use of iterative reconstruction technique. COMPARISON:  09/20/2023 FINDINGS: CT HEAD FINDINGS Brain: No evidence of acute infarction, hemorrhage, hydrocephalus, extra-axial collection or mass lesion/mass effect. Low-density in the cerebral white matter attributed to chronic small vessel ischemia. Chronic lacunar infarct at the genu of the left internal capsule. Vascular: No hyperdense vessel or unexpected calcification. Skull: Normal. Negative for fracture or focal lesion. Sinuses/Orbits: No evidence of injury CT CERVICAL SPINE FINDINGS Alignment: Straightening of cervical lordosis. Skull base and vertebrae: No acute fracture. No primary bone lesion or focal pathologic process. Soft tissues and spinal canal: No prevertebral fluid or swelling. No visible canal hematoma. Disc levels: Generalized degenerative disc narrowing and endplate degeneration. Generalized degenerative facet spurring. Multilevel foraminal impingement. Upper chest: Costovertebral ankylosis at upper thoracic levels. No acute finding IMPRESSION: No evidence of acute intracranial or cervical spine injury. Electronically Signed   By: Ronnette Coke M.D.   On: 04/05/2024 10:02   CT Cervical Spine Wo Contrast Result Date: 04/05/2024 CLINICAL DATA:  Minor head trauma, fall on blood thinner EXAM: CT HEAD WITHOUT CONTRAST CT CERVICAL SPINE  WITHOUT CONTRAST TECHNIQUE: Multidetector CT imaging of the head and cervical spine was performed following the standard protocol without intravenous contrast. Multiplanar CT image reconstructions of the cervical spine were also generated. RADIATION DOSE REDUCTION: This exam was performed according to the departmental dose-optimization program which includes automated exposure control, adjustment of the mA and/or kV according to patient size and/or use of iterative reconstruction technique. COMPARISON:  09/20/2023 FINDINGS: CT HEAD FINDINGS Brain: No evidence of acute infarction, hemorrhage, hydrocephalus, extra-axial collection or mass lesion/mass effect. Low-density in the cerebral white matter attributed to chronic small vessel ischemia. Chronic lacunar infarct at the genu of the left internal capsule. Vascular: No hyperdense vessel or unexpected calcification. Skull: Normal. Negative for fracture or focal lesion. Sinuses/Orbits: No evidence of injury CT CERVICAL SPINE FINDINGS Alignment: Straightening of cervical lordosis. Skull base and vertebrae: No acute fracture. No primary bone lesion or focal pathologic process. Soft tissues and spinal canal: No prevertebral fluid or swelling. No visible canal hematoma. Disc levels: Generalized degenerative disc narrowing and endplate degeneration. Generalized degenerative facet spurring. Multilevel foraminal impingement. Upper chest: Costovertebral ankylosis at upper thoracic levels. No acute finding IMPRESSION: No evidence of acute intracranial or cervical spine injury. Electronically Signed   By: Ronnette Coke M.D.   On: 04/05/2024 10:02   DG Pelvis 1-2 Views Result Date: 04/05/2024 CLINICAL DATA:  Fall. EXAM: PELVIS - 1-2 VIEW COMPARISON:  June 14, 2021. FINDINGS: There is no evidence of pelvic fracture or diastasis. No pelvic bone lesions are seen. IMPRESSION: Negative. Electronically Signed   By: Rosalene Colon M.D.   On: 04/05/2024 09:06   DG Chest Portable 1  View Result Date: 04/05/2024 CLINICAL DATA:  Fall. EXAM: PORTABLE CHEST 1 VIEW COMPARISON:  January 19, 2023. FINDINGS: Stable cardiomediastinal silhouette. Left lung is clear. Mild to moderate right pleural effusion is noted with associated atelectasis. Bony thorax is unremarkable. IMPRESSION: Mild to moderate right pleural effusion is noted with associated right basilar atelectasis. Electronically Signed   By: Rosalene Colon M.D.   On: 04/05/2024 09:04    Procedures Procedures    Medications Ordered in ED Medications - No data to display  ED Course/ Medical Decision Making/ A&P Clinical Course as of 04/05/24 1048  Fri Apr 05, 2024  4098 Patient noted to be on anticoagulation.  Level  2 trauma activated [JK]  1009 Basic metabolic panel(!) Metabolic panel consistent with chronic kidney disease.  CBC shows stable hemoglobin [JK]  1010 Patient noted to have mild to moderate right pleural effusion with right basilar atelectasis. [JK]  1010 Head CT C-spine CT and chest x-ray without acute findings [JK]  1048 Case dicussed with medical service regarding admission [JK]    Clinical Course User Index [JK] Trish Furl, MD                                 Medical Decision Making Problems Addressed: Fall, initial encounter: acute illness or injury that poses a threat to life or bodily functions Pleural effusion: acute illness or injury that poses a threat to life or bodily functions Stage 5 chronic kidney disease on chronic dialysis [facility]): chronic illness or injury with exacerbation, progression, or side effects of treatment  Amount and/or Complexity of Data Reviewed Labs: ordered. Decision-making details documented in ED Course. Radiology: ordered.  Risk Decision regarding hospitalization.   Patient presents ED for evaluation after a fall.  Patient is a resident of a nursing facility.  He had an unwitnessed fall and is on anticoagulation.  Patient also has history of chronic kidney  disease and last went to dialysis yesterday.  Patient denied any severe pain or injury on exam.  He did appear chronically ill.  Patient had edema noted on lower extremities but this appears to be a chronic finding.  Patient reports having some issues with shortness of breath recently.    Patient's x-rays do not show any signs of any serious injury associated with fall.  His chest x-ray however does show a new pleural effusion.  At the bedside patient's oxygen saturations are low 90s at rest.  Will start him on supplemental oxygen.  Patient is not having any fevers or chills.  Suspect this could be a transudative effusion associated with CHF.  Patient would benefit from further workup.  Will consult medical service for admission further evaluation.        Final Clinical Impression(s) / ED Diagnoses Final diagnoses:  Pleural effusion  Stage 5 chronic kidney disease on chronic dialysis [facility])  Fall, initial encounter    Rx / DC Orders ED Discharge Orders     None         Trish Furl, MD 04/05/24 1048

## 2024-04-05 NOTE — Procedures (Signed)
 PROCEDURE SUMMARY:  Successful image-guided right thoracentesis. Yielded 0.625 liters of opaque, blood-laden pleural fluid. Patient tolerated procedure well. EBL: Zero No immediate complications.  Specimen was sent for labs. Post procedure CXR pending.  Please see imaging section of Epic for full dictation.  Gordy Lauber Vermon Grays PA-C 04/05/2024 2:04 PM

## 2024-04-05 NOTE — Hospital Course (Addendum)
 [name] is a 73 y.o. with a pertinent PMH of A-fib on A/C, type 2 diabetes, hypertension, ESRD on hemodialysis who presented from SNF after a fall, found to have right pleural effusion.  He underwent thoracentesis x2 and placement of right chest tube.  His hospital stay was complicated by persistent chest tube site output as well as hyperkalemia.  His right-sided chest tube site output eventually slowed, and he resumed his prior anticoagulation regimen without complication.  His hyperkalemia was temporized and resolved with dialysis.  Of note the patient signed himself out of dialysis early AMA, and refused dialysis on day of discharge.    # Fall He presented from skilled nursing [facility] by EMS after he had a fall when transferring from his bed to a wheelchair.  Per EMS report he complained of dizziness prior to the fall.  It is suspected that the patient's fall was secondary to orthostatic hypotension as EMS stated patient was hypotensive at the time. CT of the head negative for acute bleed. No focal weakness on exams.   # Right pleural effusion # Chronic diastolic heart failure Patient with a prior hx of right sided mild pleural effusion as noted in CXR 2024. He was found to have a moderate pleural effusion, during this hospitalization. He was asymptomatic.  A 5-day course of Unasyn  was completed for a presumed pulmonary infection/empyema. He underwent a thoracentesis, which yielded approximately 650 cc of blood-tinged fluid. Cytology of the pleural fluid showed chronic inflammation without malignant cells; AFB smear was negative. Fluid studies were consistent with a lymphocytic exudative pleural effusion. PCCM was consulted and performed a repeat thoracentesis, removing approximately 500 cc of serosanguineous fluid. Subsequently, a chest tube was placed. Cardiothoracic surgery was consulted but did not recommend any surgical intervention, as the patient was deemed a poor surgical  candidate.  Pulmonology did not feel that the patient would be a good candidate for pleural lytics due to lung trapping.  Interventional radiology was consulted regarding possible consolidation and lung biopsy but they did not feel like they had a appropriate target for biopsy. After chest tube removal, the patient also experienced bleeding at the chest tube insertion site.  Anticoagulation was discontinued, pressure dressings were applied, and output stopped.  Anticoagulation was then again resumed without complication.  Repeat imaging redemonstrated persistence of complex effusion.  The patient was asymptomatic during this time. Pulmonology was again consulted who felt that another chest tube was not warranted.  If symptomatic they would consider chest tube, pleural lytics, and eventual pleurX.  They also felt that should this process be malignant it would be terminal and he would not be a candidate for any antineoplastic treatment.  They recommended close monitoring and will see the patient in their office in the coming weeks -Close follow-up with pulmonology appreciate their help.  # CKD V on ESRD (Tue/Th/ Sat) # Normocytic anemia Hospital stay complicated by hyperkalemia due to missed dialysis in the setting of access complications.  The patient signed AMA and left dialysis early, but their hyperkalemia did resolve.  They refused repeat dialysis on day of discharge, nephrology coordinated discharge for the patient felt that he was safe to go home from a renal perspective. - Continue dialysis per nephrology  # A-fib on Eliquis  Asymptomatic  Continued Eliquis  5 mg.  #Hypothyroidism - Continue levothyroxine  50 mcg   #Hypertension - Lisinopril  40 mg. - Hydralazine  25 mg 3 times daily   #Hyperlipidemia #PVD - Continue Lipitor  80 mg daily -  ASA 81

## 2024-04-05 NOTE — Evaluation (Signed)
 Physical Therapy Evaluation Patient Details Name: [name] MRN: [id number] DOB: 1951/09/20 Today's Date: 04/05/2024  History of Present Illness  [name] is a 73 year old male who presented to the ED 4/25 for evaluation after a fall.  Found to have a mild to moderate right pleural effusion. S/p Thoracentesis with IR 4/25. PMhx: CKD on HD Tuesday/Thursday/Saturday; CHF, diabetes, CKD, HTN.  Clinical Impression  Pt admitted with above diagnosis. Feels close to baseline per pt report, slightly weaker. States he rolled out of his bed which has happened multiple times before - educate do on possible rail installation. Orthostatics checked, negative, asymptomatic. States he is independent at [facility] and uses [facility] for short distance gait, w/c for longer distances. Will follow acutely and monitor for any changes. Not interested in HHPT but may consider OPPT, which I recommend due to his report of increased weakness from baseline. Pt currently with functional limitations due to the deficits listed below (see PT Problem List). Pt will benefit from acute skilled PT to increase their independence and safety with mobility to allow discharge.         04/05/24 1643  Orthostatic Lying   BP- Lying 126/53  Pulse- Lying 61  Orthostatic Sitting  BP- Sitting 121/70  Pulse- Sitting 70  Orthostatic Standing at 0 minutes  BP- Standing at 0 minutes 132/49  Pulse- Standing at 0 minutes 73  Orthostatic Standing at 3 minutes  BP- Standing at 3 minutes 130/53  Pulse- Standing at 3 minutes 79       If plan is discharge home, recommend the following: A little help with walking and/or transfers;Assistance with cooking/housework   Can travel by private vehicle        Equipment Recommendations Other (comment) (RW if facility does not provide. Already has SPC and w/c)  Recommendations for Other Services       Functional Status Assessment Patient has had a recent decline in their functional status  and demonstrates the ability to make significant improvements in function in a reasonable and predictable amount of time.     Precautions / Restrictions Precautions Precautions: Fall Recall of Precautions/Restrictions: Intact Precaution/Restrictions Comments: fall Restrictions Weight Bearing Restrictions Per Provider Order: No      Mobility  Bed Mobility Overal bed mobility: Needs Assistance Bed Mobility: Supine to Sit     Supine to sit: Contact guard     General bed mobility comments: CGA for safety, slow to rise.    Transfers Overall transfer level: Needs assistance Equipment used: Rolling walker (2 wheels) Transfers: Sit to/from Stand Sit to Stand: Supervision           General transfer comment: supervision for safety, slow to rise but stable once holding RW for support.    Ambulation/Gait Ambulation/Gait assistance: Supervision Gait Distance (Feet): 65 Feet Assistive device: Rolling walker (2 wheels) Gait Pattern/deviations: Step-through pattern, Decreased stride length, Trunk flexed, Decreased dorsiflexion - left Gait velocity: dec Gait velocity interpretation: <1.31 ft/sec, indicative of household ambulator   General Gait Details: Slower gait but stable with RW for support. Cues for upright posture and larger step length. Demonstrates mild Lt foot drop but compensates well. No buckling or overt LOB. Denies dizziness.  Stairs            Wheelchair Mobility     Tilt Bed    Modified Rankin (Stroke Patients Only)       Balance Overall balance assessment: Mild deficits observed, not formally tested  Pertinent Vitals/Pain Pain Assessment Pain Assessment: No/denies pain    Home Living Family/patient expects to be discharged to:: Skilled nursing facility [facility]) Living Arrangements:  (SNF for 3 years) Available Help at Discharge: Available 24 hours/day             Home  Equipment: Jeananne Mighty - single point;Wheelchair - manual Additional Comments: Has been at [facility] for 3 years.    Prior Function Prior Level of Function : Independent/Modified Independent;History of Falls (last six months)             Mobility Comments: States he was independent with SPC short distances, uses W/c for longer distances at SNF ADLs Comments: States he bath/dresses himself.     Extremity/Trunk Assessment   Upper Extremity Assessment Upper Extremity Assessment: Defer to OT evaluation    Lower Extremity Assessment Lower Extremity Assessment: Generalized weakness       Communication   Communication Communication: No apparent difficulties    Cognition Arousal: Alert Behavior During Therapy: WFL for tasks assessed/performed   PT - Cognitive impairments: No apparent impairments                         Following commands: Intact       Cueing Cueing Techniques: Verbal cues     General Comments General comments (skin integrity, edema, etc.): See orthostatics tab. Asymptomtic throughout    Exercises     Assessment/Plan    PT Assessment Patient needs continued PT services  PT Problem List Decreased strength;Decreased range of motion;Decreased activity tolerance;Decreased balance;Decreased mobility       PT Treatment Interventions DME instruction;Gait training;Functional mobility training;Therapeutic activities;Therapeutic exercise;Balance training;Neuromuscular re-education;Patient/family education    PT Goals (Current goals can be found in the Care Plan section)  Acute Rehab PT Goals Patient Stated Goal: Move out of [place] PT Goal Formulation: With patient Time For Goal Achievement: 04/19/24 Potential to Achieve Goals: Good    Frequency Min 1X/week     Co-evaluation               AM-PAC PT "6 Clicks" Mobility  Outcome Measure Help needed turning from your back to your side while in a flat bed without using bedrails?:  None Help needed moving from lying on your back to sitting on the side of a flat bed without using bedrails?: A Little Help needed moving to and from a bed to a chair (including a wheelchair)?: A Little Help needed standing up from a chair using your arms (e.g., wheelchair or bedside chair)?: A Little Help needed to walk in hospital room?: A Little Help needed climbing 3-5 steps with a railing? : A Little 6 Click Score: 19    End of Session   Activity Tolerance: Patient tolerated treatment well Patient left: in bed;with call bell/phone within reach;with bed alarm set;with family/visitor present   PT Visit Diagnosis: Unsteadiness on feet (R26.81);Other abnormalities of gait and mobility (R26.89);Repeated falls (R29.6);Muscle weakness (generalized) (M62.81);History of falling (Z91.81)    Time: 5784-6962 PT Time Calculation (min) (ACUTE ONLY): 25 min   Charges:   PT Evaluation $PT Eval Low Complexity: 1 Low PT Treatments $Therapeutic Activity: 8-22 mins PT General Charges $$ ACUTE PT VISIT: 1 Visit         Jory Ng, PT, DPT [facility]  Rehabilitation Services Physical Therapist Office: [phone] Website: [email or web address]   Alinda Irani 04/05/2024, 5:29 PM

## 2024-04-05 NOTE — ED Notes (Signed)
 Patient transported to CT

## 2024-04-05 NOTE — H&P (Addendum)
 Date: 04/05/2024         Patient Name:  [name] MRN: [id number]  DOB: [birth year] Age / Sex: 73 y.o., male   PCP: Shannan Dart., [name]         Medical Service: Internal Medicine Teaching Service         Attending Physician: Dr. Priscella Brooms, DO     First Contact: Dr. Marni Sins, MD Pager [phone]  Pager: [phone]  Second Contact: Dr. Chrissie Coupe, MD  Pager: [phone]       After Hours (After 5p/  First Contact Pager: [phone]  weekends / holidays): Second Contact Pager: [phone]   Chief Concern: Fall  History of Present Illness: [name] is a 73 year old male with a hx of CHF, diabetes, CKD, HTN who presented to the ED for evaluation after a fall at her facility.  Patient states he fell out of bed this morning accidentally.  No prodromal symptoms, no loss of consciousness.  Denies any weakness.  He has a mild headache, no fevers or chills.  Denies chest pain, orthopnea or PND. Had a prior fall, unable to tell me the exact time, says "a while ago".  Patient has a history of CKD on HD Tuesday/Thursday/Saturday.  He was last dialyzed on Thursday, endorses compliance.  He has a mild chronic dry cough cough.  Denies new shortness of breath, weight loss.   Allergies: Allergies  Allergen Reactions   Shellfish Allergy Swelling   Shrimp (Diagnostic) Swelling    Past Medical History: Hypertension Hyperlipidemia Diabetes Chronic diastolic heart failure Peripheral vascular disease Venous insufficiency.  Medications: Aspirin  81mg   Jardiance 10mg   Lasix  20mg  PRN Metoprolol   succinate 50mg  Pantoprazole  40mg  Miralax   Entresto 24-28 BID Metformin 500mg  BID Senna Eliquis  5mg  BID  Surgical History: Past Surgical History:  Procedure Laterality Date   A/V FISTULAGRAM N/A 02/02/2024   Procedure: A/V Fistulagram;  Surgeon: Baron Border, MD;  Location: [facility] INVASIVE CV LAB;  Service: Cardiovascular;  Laterality: N/A;   ABDOMINAL AORTOGRAM W/LOWER  EXTREMITY N/A 05/19/2017   Procedure: Abdominal Aortogram w/Lower Extremity;  Surgeon: Richrd Char, MD;  Location: [facility] INVASIVE CV LAB;  Service: Cardiovascular;  Laterality: N/A;   ABDOMINAL AORTOGRAM W/LOWER EXTREMITY N/A 06/18/2021   Procedure: ABDOMINAL AORTOGRAM W/LOWER EXTREMITY;  Surgeon: Richrd Char, MD;  Location: [facility] INVASIVE CV LAB;  Service: Cardiovascular;  Laterality: N/A;   ABDOMINAL AORTOGRAM W/LOWER EXTREMITY N/A 04/17/2023   Procedure: ABDOMINAL AORTOGRAM W/LOWER EXTREMITY;  Surgeon: Adine Hoof, MD;  Location: [facility] INVASIVE CV LAB;  Service: Cardiovascular;  Laterality: N/A;   ABDOMINAL AORTOGRAM W/LOWER EXTREMITY N/A 05/22/2023   Procedure: ABDOMINAL AORTOGRAM W/LOWER EXTREMITY;  Surgeon: Adine Hoof, MD;  Location: [facility] INVASIVE CV LAB;  Service: Cardiovascular;  Laterality: N/A;   AMPUTATION     Right and left fifth toes.    AMPUTATION Left 06/18/2021   Procedure: Amputation transmetatarsal of toes 2 3 and 4 left foot ;  Surgeon: Richrd Char, MD;  Location: [facility] OR;  Service: Vascular;  Laterality: Left;   AMPUTATION TOE     emoval of both little toes   AV FISTULA PLACEMENT Left 03/21/2014   Procedure: ARTERIOVENOUS (AV) FISTULA CREATION with ultrasound;  Surgeon: Mayo Speck, MD;  Location: [facility] OR;  Service: Vascular;  Laterality: Left;   COLONOSCOPY     EYE SURGERY Bilateral    cataract and lens implant   FEMORAL-TIBIAL BYPASS GRAFT Left 06/18/2021  Procedure: Left below-knee popliteal to posterior tibial artery bypass with 9 reversed left greater saphenous vein;  Surgeon: Richrd Char, MD;  Location: [facility] OR;  Service: Vascular;  Laterality: Left;   HERNIA REPAIR     LIGATION OF COMPETING BRANCHES OF ARTERIOVENOUS FISTULA Left 06/29/2015   Procedure: LIGATION OF LEFT ARM RADIOCEPHALIC ARTERIOVENOUS FISTULA SIDE BRANCHES;  Surgeon: Arvil Lauber, MD;  Location: [facility] OR;  Service: Vascular;  Laterality: Left;   MULTIPLE EXTRACTIONS WITH  ALVEOLOPLASTY N/A 04/07/2017   Procedure: Extraction of tooth #'s 1-11, 13, 14,16, 20-23, and 26-28 with alveoloplasty;  Surgeon: Carol Chroman, DDS;  Location: [facility] OR;  Service: Oral Surgery;  Laterality: N/A;   PERIPHERAL VASCULAR INTERVENTION Right 04/17/2023   Procedure: PERIPHERAL VASCULAR INTERVENTION;  Surgeon: Adine Hoof, MD;  Location: [facility] INVASIVE CV LAB;  Service: Cardiovascular;  Laterality: Right;  Right SFA Stent   PERIPHERAL VASCULAR INTERVENTION Left 05/22/2023   Procedure: PERIPHERAL VASCULAR INTERVENTION;  Surgeon: Adine Hoof, MD;  Location: [facility] INVASIVE CV LAB;  Service: Cardiovascular;  Laterality: Left;  SFA   Popliteal to posterior tibial bypass     2006   R knee arthoscopic repair of meniscus     UMBILICAL HERNIA REPAIR     Family History:  Family History  Problem Relation Age of Onset   Diabetes Mother    Heart disease Mother    Diabetes Brother    Social History:  Has been living in [place] for three years, divorced, Erna He ( partner) Doesn't drink or smoke, or do any recreational drugs PCP: Gaetana Jones [name]  Physical Exam: Blood pressure (!) 127/59, pulse 68, temperature 97.8 F (36.6 C), temperature source Oral, resp. rate 14, height 5\' 10"  (1.778 m), weight 86.1 kg, SpO2 100%.   Constitutional: Lying in bed, NAD.   Heart with regular rate and rhythm, no murmurs. Clear bilateral lungs, normal air movement. Soft, nontender abdomen. Alert oriented x 3.  No focal deficit.  Cranial nerve II-II  normal. Skin: warm and dry  Labs:      Latest Ref Rng & Units 04/05/2024    8:49 AM 02/02/2024   10:09 AM 09/20/2023    3:08 PM  CBC  WBC 4.0 - 10.5 K/uL 5.8   9.6   Hemoglobin 13.0 - 17.0 g/dL 09.8  11.9  14.7   Hematocrit 39.0 - 52.0 % 40.2  30.0  33.2   Platelets 150 - 400 K/uL 146   195         Latest Ref Rng & Units 04/05/2024    8:49 AM 02/02/2024   10:09 AM 09/20/2023    3:08 PM  CMP  Glucose 70 - 99 mg/dL 73  79  829    BUN 8 - 23 mg/dL 20  41  25   Creatinine 0.61 - 1.24 mg/dL 5.62  1.30  8.65   Sodium 135 - 145 mmol/L 136  139  137   Potassium 3.5 - 5.1 mmol/L 3.5  4.2  4.0   Chloride 98 - 111 mmol/L 96  101  93   CO2 22 - 32 mmol/L 29   30   Calcium  8.9 - 10.3 mg/dL 8.8   9.3   Total Protein 6.5 - 8.1 g/dL   7.9   Total Bilirubin 0.3 - 1.2 mg/dL   0.7   Alkaline Phos 38 - 126 U/L   55   AST 15 - 41 U/L   16   ALT 0 - 44  U/L   18      Images and other studies:  CT Head Wo Contrast No evidence of acute intracranial or cervical spine injury.  CT Cervical Spine Wo Contrast IMPRESSION: No evidence of acute intracranial or cervical spine injury.   DG Pelvis 1-2 Views  There is no evidence of pelvic fracture or diastasis. No pelvic bone lesions are seen. IMPRESSION:   DG Chest Portable 1 View  Mild to moderate right pleural effusion is noted with associated right basilar atelectasis.      EKG:  Sinus rhythm with prolonged PR interval.  Assessment & Plan:  [name] is a 73 y.o.  male with PMHx of diabetes, hypertension, hyperlipidemia, CKD stage V, heart failure who presented to the ED after he had an unwitnessed fall, and admitted for further workup of the right pleural effusion. Active Problems:   * No active hospital problems. *   # Right pleural effusion # Chronic diastolic heart failure Initially presented in after a fall, found to have a mild to moderate right pleural effusion. He had a CXR in 03/2023, that showed mild pleural effusion.  Asymptomatic, afebrile, no white count.  Echo 2022 LVEF 60 to 65%, moderate calcification of the aortic valve. I suspect a transudative effusion from CKD and CHF .Will consult  IR for thoracentesis given there is increase in fluid on CXR.  - Thoracentesis with IR - Will check LDH, pleural amylase, protein, AFB smear. - Echocardiogram.  # Fall Etiology unclear.  I doubt ACS, as EKG is normal without ST elevation.   CT of the head negative for  stroke, no focal weakness on physical exam.  Normal volume status. - Cardiac monitoring. - Check orthostatics.  # CKD V on ESRD (Tue/Th/ Sat) # Normocytic anemia Last dialyzed on 4/24.  SCr of 5.48, CBC shows stable Hb.  -Will consult inpatient nephrology for dialysis tomorrow. - BMP.  Chronic medical conditions:  #Hypothyroidism -Levothyroxine 50 mcg.  #Hypertension - Lisinopril  40 mg. - Hydralazine  25 mg PRN  Diet: Normal VTE: DOAC IVF: None,None Code: Full  Signed: Marni Sins, MD 04/05/2024, 10:51 AM

## 2024-04-06 ENCOUNTER — Observation Stay (HOSPITAL_COMMUNITY)

## 2024-04-06 DIAGNOSIS — I890 Lymphedema, not elsewhere classified: Secondary | ICD-10-CM | POA: Diagnosis present

## 2024-04-06 DIAGNOSIS — E875 Hyperkalemia: Secondary | ICD-10-CM | POA: Diagnosis not present

## 2024-04-06 DIAGNOSIS — Y844 Aspiration of fluid as the cause of abnormal reaction of the patient, or of later complication, without mention of misadventure at the time of the procedure: Secondary | ICD-10-CM | POA: Diagnosis not present

## 2024-04-06 DIAGNOSIS — R918 Other nonspecific abnormal finding of lung field: Secondary | ICD-10-CM | POA: Diagnosis not present

## 2024-04-06 DIAGNOSIS — E8809 Other disorders of plasma-protein metabolism, not elsewhere classified: Secondary | ICD-10-CM | POA: Diagnosis present

## 2024-04-06 DIAGNOSIS — I5031 Acute diastolic (congestive) heart failure: Secondary | ICD-10-CM | POA: Diagnosis not present

## 2024-04-06 DIAGNOSIS — E039 Hypothyroidism, unspecified: Secondary | ICD-10-CM | POA: Diagnosis present

## 2024-04-06 DIAGNOSIS — I132 Hypertensive heart and chronic kidney disease with heart failure and with stage 5 chronic kidney disease, or end stage renal disease: Secondary | ICD-10-CM | POA: Diagnosis present

## 2024-04-06 DIAGNOSIS — N186 End stage renal disease: Secondary | ICD-10-CM | POA: Diagnosis present

## 2024-04-06 DIAGNOSIS — E1122 Type 2 diabetes mellitus with diabetic chronic kidney disease: Secondary | ICD-10-CM | POA: Diagnosis present

## 2024-04-06 DIAGNOSIS — W19XXXD Unspecified fall, subsequent encounter: Secondary | ICD-10-CM | POA: Diagnosis not present

## 2024-04-06 DIAGNOSIS — Z992 Dependence on renal dialysis: Secondary | ICD-10-CM | POA: Diagnosis not present

## 2024-04-06 DIAGNOSIS — D649 Anemia, unspecified: Secondary | ICD-10-CM | POA: Diagnosis not present

## 2024-04-06 DIAGNOSIS — J869 Pyothorax without fistula: Secondary | ICD-10-CM | POA: Diagnosis present

## 2024-04-06 DIAGNOSIS — W19XXXA Unspecified fall, initial encounter: Secondary | ICD-10-CM | POA: Diagnosis not present

## 2024-04-06 DIAGNOSIS — W06XXXA Fall from bed, initial encounter: Secondary | ICD-10-CM | POA: Diagnosis present

## 2024-04-06 DIAGNOSIS — N2581 Secondary hyperparathyroidism of renal origin: Secondary | ICD-10-CM | POA: Diagnosis present

## 2024-04-06 DIAGNOSIS — Z87891 Personal history of nicotine dependence: Secondary | ICD-10-CM | POA: Diagnosis not present

## 2024-04-06 DIAGNOSIS — I4891 Unspecified atrial fibrillation: Secondary | ICD-10-CM | POA: Diagnosis present

## 2024-04-06 DIAGNOSIS — E871 Hypo-osmolality and hyponatremia: Secondary | ICD-10-CM | POA: Diagnosis present

## 2024-04-06 DIAGNOSIS — R627 Adult failure to thrive: Secondary | ICD-10-CM | POA: Diagnosis present

## 2024-04-06 DIAGNOSIS — J189 Pneumonia, unspecified organism: Secondary | ICD-10-CM | POA: Diagnosis present

## 2024-04-06 DIAGNOSIS — Z95820 Peripheral vascular angioplasty status with implants and grafts: Secondary | ICD-10-CM | POA: Diagnosis not present

## 2024-04-06 DIAGNOSIS — I358 Other nonrheumatic aortic valve disorders: Secondary | ICD-10-CM | POA: Diagnosis present

## 2024-04-06 DIAGNOSIS — L7622 Postprocedural hemorrhage and hematoma of skin and subcutaneous tissue following other procedure: Secondary | ICD-10-CM | POA: Diagnosis not present

## 2024-04-06 DIAGNOSIS — E1151 Type 2 diabetes mellitus with diabetic peripheral angiopathy without gangrene: Secondary | ICD-10-CM | POA: Diagnosis present

## 2024-04-06 DIAGNOSIS — D631 Anemia in chronic kidney disease: Secondary | ICD-10-CM | POA: Diagnosis present

## 2024-04-06 DIAGNOSIS — E785 Hyperlipidemia, unspecified: Secondary | ICD-10-CM | POA: Diagnosis present

## 2024-04-06 DIAGNOSIS — J9811 Atelectasis: Secondary | ICD-10-CM | POA: Diagnosis present

## 2024-04-06 DIAGNOSIS — I5032 Chronic diastolic (congestive) heart failure: Secondary | ICD-10-CM | POA: Diagnosis present

## 2024-04-06 DIAGNOSIS — J90 Pleural effusion, not elsewhere classified: Secondary | ICD-10-CM | POA: Diagnosis present

## 2024-04-06 DIAGNOSIS — E11319 Type 2 diabetes mellitus with unspecified diabetic retinopathy without macular edema: Secondary | ICD-10-CM | POA: Diagnosis present

## 2024-04-06 LAB — CBC
HCT: 34.6 % — ABNORMAL LOW (ref 39.0–52.0)
Hemoglobin: 10.8 g/dL — ABNORMAL LOW (ref 13.0–17.0)
MCH: 29.4 pg (ref 26.0–34.0)
MCHC: 31.2 g/dL (ref 30.0–36.0)
MCV: 94.3 fL (ref 80.0–100.0)
Platelets: 118 10*3/uL — ABNORMAL LOW (ref 150–400)
RBC: 3.67 MIL/uL — ABNORMAL LOW (ref 4.22–5.81)
RDW: 16.7 % — ABNORMAL HIGH (ref 11.5–15.5)
WBC: 5.4 10*3/uL (ref 4.0–10.5)
nRBC: 0 % (ref 0.0–0.2)

## 2024-04-06 LAB — BASIC METABOLIC PANEL WITH GFR
Anion gap: 8 (ref 5–15)
BUN: 29 mg/dL — ABNORMAL HIGH (ref 8–23)
CO2: 30 mmol/L (ref 22–32)
Calcium: 9.2 mg/dL (ref 8.9–10.3)
Chloride: 99 mmol/L (ref 98–111)
Creatinine, Ser: 7.13 mg/dL — ABNORMAL HIGH (ref 0.61–1.24)
GFR, Estimated: 7 mL/min — ABNORMAL LOW (ref >60)
Glucose, Bld: 86 mg/dL (ref 70–99)
Potassium: 3.4 mmol/L — ABNORMAL LOW (ref 3.5–5.1)
Sodium: 137 mmol/L (ref 135–145)

## 2024-04-06 LAB — ECHOCARDIOGRAM COMPLETE
AR max vel: 1.5 cm2
AV Area VTI: 1.55 cm2
AV Area mean vel: 1.41 cm2
AV Mean grad: 8 mmHg
AV Peak grad: 15.7 mmHg
Ao pk vel: 1.98 m/s
Area-P 1/2: 4.44 cm2
Calc EF: 59.9 %
Height: 70 in
S' Lateral: 2.8 cm
Single Plane A2C EF: 61.5 %
Single Plane A4C EF: 57.2 %
Weight: 2744.29 [oz_av]

## 2024-04-06 LAB — HEPATITIS B SURFACE ANTIGEN: Hepatitis B Surface Ag: NONREACTIVE

## 2024-04-06 MED ORDER — DOXERCALCIFEROL 4 MCG/2ML IV SOLN: 6.0000 ug | INTRAVENOUS | Status: DC

## 2024-04-06 MED ORDER — IOHEXOL 350 MG/ML SOLN
75.0000 mL | Freq: Once | INTRAVENOUS | Status: AC | PRN
  Administered 2024-04-06: 75 mL via INTRAVENOUS

## 2024-04-06 MED ORDER — HEPARIN SODIUM (PORCINE) 1000 UNIT/ML IJ SOLN: 2000.0000 [IU] | Freq: Once | INTRAMUSCULAR | Status: DC

## 2024-04-06 MED ORDER — DOXERCALCIFEROL 4 MCG/2ML IV SOLN
6.0000 ug | INTRAVENOUS | Status: DC
  Administered 2024-04-06 – 2024-04-17 (×4): 6 ug via INTRAVENOUS

## 2024-04-06 MED ORDER — DOXERCALCIFEROL 4 MCG/2ML IV SOLN
INTRAVENOUS | Status: AC
  Filled 2024-04-06: qty 2

## 2024-04-06 MED ORDER — HEPARIN SODIUM (PORCINE) 1000 UNIT/ML DIALYSIS
3000.0000 [IU] | Freq: Once | INTRAMUSCULAR | Status: AC
  Administered 2024-04-06: 3000 [IU] via INTRAVENOUS_CENTRAL

## 2024-04-06 MED ORDER — HEPARIN SODIUM (PORCINE) 1000 UNIT/ML IJ SOLN
INTRAMUSCULAR | Status: AC
  Filled 2024-04-06: qty 5

## 2024-04-06 MED ORDER — ENSURE ENLIVE PO LIQD
237.0000 mL | Freq: Two times a day (BID) | ORAL | Status: DC
  Administered 2024-04-06: 237 mL via ORAL

## 2024-04-06 MED ORDER — SODIUM CHLORIDE 0.9 % IV SOLN
3.0000 g | INTRAVENOUS | Status: DC
  Administered 2024-04-06 – 2024-04-11 (×6): 3 g via INTRAVENOUS
  Filled 2024-04-06 (×6): qty 8

## 2024-04-06 MED ORDER — RENA-VITE PO TABS
1.0000 | ORAL_TABLET | Freq: Every day | ORAL | Status: DC
  Administered 2024-04-07 – 2024-04-17 (×10): 1 via ORAL
  Filled 2024-04-06 (×12): qty 1

## 2024-04-06 MED ORDER — CINACALCET HCL 30 MG PO TABS
30.0000 mg | ORAL_TABLET | ORAL | Status: DC
  Administered 2024-04-06 – 2024-04-16 (×4): 30 mg via ORAL
  Filled 2024-04-06 (×4): qty 1

## 2024-04-06 MED ORDER — NEPRO/CARBSTEADY PO LIQD
237.0000 mL | Freq: Two times a day (BID) | ORAL | Status: DC
  Administered 2024-04-06 – 2024-04-16 (×5): 237 mL via ORAL

## 2024-04-06 MED ORDER — PROSOURCE PLUS PO LIQD
30.0000 mL | Freq: Two times a day (BID) | ORAL | Status: DC
  Administered 2024-04-09 – 2024-04-18 (×7): 30 mL via ORAL
  Filled 2024-04-06 (×10): qty 30

## 2024-04-06 MED ORDER — CHLORHEXIDINE GLUCONATE CLOTH 2 % EX PADS
6.0000 | MEDICATED_PAD | Freq: Every day | CUTANEOUS | Status: DC
  Administered 2024-04-06: 6 via TOPICAL

## 2024-04-06 NOTE — Consult Note (Addendum)
 NAME:  [name], MRN:  [id number], DOB:  1951-09-22, LOS: 0 ADMISSION DATE:  04/05/2024, CONSULTATION DATE:  04/06/2024 REFERRING MD:  Kirt Pereyra MD, CHIEF COMPLAINT:   Pleural effusion, abnormal CT  History of Present Illness:   73 year old with history of end-stage renal disease on hemodialysis, hypothyroidism, hypertension, atrial fibrillation on Eliquis  Presenting after fall out of bed yesterday morning.  He fell on the right side and noted to have right-sided pleural effusion Underwent thoracentesis by IR yesterday with findings of bloody exudative effusion with lymphocyte predominance Follow-up CT shows possible lung mass and persistent moderate effusion and PCCM consulted for help with management.  He is a ex-smoker but quit at the age of 28.  Used to work in transportation.  May have had some exposure to asbestos in the past but cannot recall clearly Ongoing exposures.  Pertinent  Medical History    has a past medical history of Arthritis, Asthma, Chronic cystitis, Chronic diastolic heart failure (HCC), Chronic kidney disease, Diabetes mellitus, Dysrhythmia, GERD (gastroesophageal reflux disease), H/O hiatal hernia, Hyperlipidemia, Hypertension, Lymphedema, Morbid obesity (HCC), NEPHROLITHIASIS, HX OF (12/02/2009), PVD (peripheral vascular disease) (HCC), Renal insufficiency, UMBILICAL HERNIA (02/08/2007), and Venous insufficiency.   Significant Hospital Events: Including procedures, antibiotic start and stop dates in addition to other pertinent events   4/25 admit, right thoracentesis  Interim History / Subjective:    Objective   Blood pressure 139/62, pulse (!) 56, temperature 97.7 F (36.5 C), temperature source Oral, resp. rate 18, height 5\' 10"  (1.778 m), weight 77.8 kg, SpO2 99%.        Intake/Output Summary (Last 24 hours) at 04/06/2024 1310 Last data filed at 04/06/2024 0600 Gross per 24 hour  Intake 180 ml  Output 0 ml  Net 180 ml   Filed Weights    04/05/24 0825 04/05/24 2014  Weight: 86.1 kg 77.8 kg    Examination: Blood pressure 139/62, pulse (!) 56, temperature 97.7 F (36.5 C), temperature source Oral, resp. rate 18, height 5\' 10"  (1.778 m), weight 77.8 kg, SpO2 99%. Gen:      No acute distress HEENT:  EOMI, sclera anicteric Neck:     No masses; no thyromegaly Lungs:    Clear to auscultation bilaterally; normal respiratory effort CV:         Regular rate and rhythm; no murmurs Abd:      + bowel sounds; soft, non-tender; no palpable masses, no distension Ext:    No edema; adequate peripheral perfusion Skin:      Warm and dry; no rash Neuro: alert and oriented x 3 Psych: normal mood and affect   Pleural fluid 04/05/2024 Bloody, total nucleated cells 02/18/2007, 91% lymphs AFB cultures pending Cytology pending  CT chest 04/06/2024 postinfusion this moderate right-sided effusion trace pleural air, dependent right lower lobe consolidation/collapse, right middle masslike opacity  Resolved Hospital Problem list     Assessment & Plan:  Pleural effusion with possible hemothorax Pleural effusion on the right side with moderate fluid remaining post-thoracentesis and possible underlying mass. Differential diagnosis includes pneumonia, tuberculosis, rheumatoid arthritis, malignancy, or hemothorax due to recent fall and anticoagulation therapy.  He does not have any significant TB exposure history or symptoms of rheumatoid arthritis.  Bloody fluid raises suspicion for hemothorax  - Recommend insertion of chest tube for complete fluid drainage - Add pleural fluid Gram stain and culture - Repeat fluid testing for cytology - Hold anticoagulation therapy (Eliquis ) - Initiate antibiotics for potential infection. Can start ceftriaxone  or unsyn -  Hold off on bronchoscopy for now until above workup can be done.  Will need follow-up imaging  Best Practice (right click and "Reselect all SmartList Selections" daily)   Per primary  team  Signature:   Maryella Abood MD [facility] See Amion for pager  If no response to pager , please call [phone] until 7pm After 7:00 pm call Elink  [phone] 04/06/2024, 1:26 PM

## 2024-04-06 NOTE — Progress Notes (Signed)
 Unable to establish USPIV access to RUE. Primary RN made aware.

## 2024-04-06 NOTE — Progress Notes (Addendum)
 HD#0 Subjective:  Summary: [name] is a 73 year old male with past medical history of ESRD on hemodialysis, a fib on A/C, type 2 diabetes, and hypertension, who presented from skilled nursing facility[facility] by EMS after he had a fall,  and found to have exudative pleural effusion.  Overnight Events: NOE.  Examined at bedside.  S/p thoracentesis with IR, says he tolerated it without any issues.  He denies new coughs or SOB.  Objective:  Vital signs in last 24 hours: Vitals:   04/05/24 2011 04/05/24 2014 04/06/24 0520 04/06/24 0740  BP: 123/60  (!) 140/58 139/62  Pulse: 65  62 (!) 56  Resp: 20  20 18   Temp: 98.6 F (37 C)  98.3 F (36.8 C) 97.7 F (36.5 C)  TempSrc: Oral  Oral Oral  SpO2: 92%  93% 99%  Weight:  77.8 kg    Height:       Supplemental O2: Room Air SpO2: 99 %   Physical Exam:  Lying in bed, NAD.   Heart with regular rate and rhythm, no murmurs. Clear bilateral lungs, normal air movement. Soft, nontender abdomen. Lower extremities with bilateral dermatitis and chronic venous disease/lymphedema.3  Filed Weights   04/05/24 0825 04/05/24 2014  Weight: 86.1 kg 77.8 kg     Intake/Output Summary (Last 24 hours) at 04/06/2024 0814 Last data filed at 04/06/2024 0600 Gross per 24 hour  Intake 180 ml  Output 0 ml  Net 180 ml   Net IO Since Admission: 180 mL [04/06/24 0814]  Recent Labs    04/05/24 2013  GLUCAP 117*     Pertinent Labs:    Latest Ref Rng & Units 04/06/2024    6:03 AM 04/05/2024    8:49 AM 02/02/2024   10:09 AM  CBC  WBC 4.0 - 10.5 K/uL 5.4  5.8    Hemoglobin 13.0 - 17.0 g/dL 32.4  40.1  02.7   Hematocrit 39.0 - 52.0 % 34.6  40.2  30.0   Platelets 150 - 400 K/uL 118  146         Latest Ref Rng & Units 04/06/2024    6:03 AM 04/05/2024    5:53 PM 04/05/2024    8:49 AM  CMP  Glucose 70 - 99 mg/dL 86  79  73   BUN 8 - 23 mg/dL 29  23  20    Creatinine 0.61 - 1.24 mg/dL 2.53  6.64  4.03   Sodium 135 - 145 mmol/L 137  138  136    Potassium 3.5 - 5.1 mmol/L 3.4  3.3  3.5   Chloride 98 - 111 mmol/L 99  98  96   CO2 22 - 32 mmol/L 30  30  29    Calcium  8.9 - 10.3 mg/dL 9.2  9.2  8.8   Total Protein 6.5 - 8.1 g/dL  6.2    Total Bilirubin 0.0 - 1.2 mg/dL  0.5    Alkaline Phos 38 - 126 U/L  35    AST 15 - 41 U/L  10    ALT 0 - 44 U/L  8      Imaging: IR THORACENTESIS ASP PLEURAL SPACE W/IMG GUIDE Result Date: 04/05/2024 INDICATION: 73 year old male presented to ED after a fall, with new pleural effusion noted. IR was requested for diagnostic and therapeutic thoracentesis. EXAM: ULTRASOUND GUIDED DIAGNOSTIC AND THERAPEUTIC THORACENTESIS MEDICATIONS: 6 cc of 1% lidocaine  COMPLICATIONS: None immediate. PROCEDURE: An ultrasound guided thoracentesis was thoroughly discussed with the patient and questions answered. The benefits,  risks, alternatives and complications were also discussed. The patient understands and wishes to proceed with the procedure. Written consent was obtained. Ultrasound was performed to localize and mark an adequate pocket of fluid in the right chest. The area was then prepped and draped in the normal sterile fashion. 1% Lidocaine  was used for local anesthesia. Under ultrasound guidance a 6 Fr Safe-T-Centesis catheter was introduced. Thoracentesis was performed. The catheter was removed and a dressing applied. FINDINGS: A total of approximately 625 cc of opaque, blood laden pleural fluid was removed. Samples were sent to the laboratory as requested by the clinical team. IMPRESSION: Successful ultrasound guided right thoracentesis yielding 625 cc of pleural fluid. Procedure performed by Lambert Pillion, PA-C Electronically Signed   By: Melven Stable.  Shick M.D.   On: 04/05/2024 16:03   DG Chest 1 View Result Date: 04/05/2024 CLINICAL DATA:  Status post thoracentesis.  Fall. EXAM: CHEST  1 VIEW COMPARISON:  Chest radiograph dated 04/05/2024. FINDINGS: Similar or slightly decreased right pleural effusion with right lung base  atelectasis or infiltrate. No pneumothorax. Stable cardiac silhouette. No acute osseous pathology. IMPRESSION: Similar or slightly decreased right pleural effusion. No pneumothorax. Electronically Signed   By: Angus Bark M.D.   On: 04/05/2024 15:14   CT Head Wo Contrast Result Date: 04/05/2024 CLINICAL DATA:  Minor head trauma, fall on blood thinner EXAM: CT HEAD WITHOUT CONTRAST CT CERVICAL SPINE WITHOUT CONTRAST TECHNIQUE: Multidetector CT imaging of the head and cervical spine was performed following the standard protocol without intravenous contrast. Multiplanar CT image reconstructions of the cervical spine were also generated. RADIATION DOSE REDUCTION: This exam was performed according to the departmental dose-optimization program which includes automated exposure control, adjustment of the mA and/or kV according to patient size and/or use of iterative reconstruction technique. COMPARISON:  09/20/2023 FINDINGS: CT HEAD FINDINGS Brain: No evidence of acute infarction, hemorrhage, hydrocephalus, extra-axial collection or mass lesion/mass effect. Low-density in the cerebral white matter attributed to chronic small vessel ischemia. Chronic lacunar infarct at the genu of the left internal capsule. Vascular: No hyperdense vessel or unexpected calcification. Skull: Normal. Negative for fracture or focal lesion. Sinuses/Orbits: No evidence of injury CT CERVICAL SPINE FINDINGS Alignment: Straightening of cervical lordosis. Skull base and vertebrae: No acute fracture. No primary bone lesion or focal pathologic process. Soft tissues and spinal canal: No prevertebral fluid or swelling. No visible canal hematoma. Disc levels: Generalized degenerative disc narrowing and endplate degeneration. Generalized degenerative facet spurring. Multilevel foraminal impingement. Upper chest: Costovertebral ankylosis at upper thoracic levels. No acute finding IMPRESSION: No evidence of acute intracranial or cervical spine  injury. Electronically Signed   By: Ronnette Coke M.D.   On: 04/05/2024 10:02   CT Cervical Spine Wo Contrast Result Date: 04/05/2024 CLINICAL DATA:  Minor head trauma, fall on blood thinner EXAM: CT HEAD WITHOUT CONTRAST CT CERVICAL SPINE WITHOUT CONTRAST TECHNIQUE: Multidetector CT imaging of the head and cervical spine was performed following the standard protocol without intravenous contrast. Multiplanar CT image reconstructions of the cervical spine were also generated. RADIATION DOSE REDUCTION: This exam was performed according to the departmental dose-optimization program which includes automated exposure control, adjustment of the mA and/or kV according to patient size and/or use of iterative reconstruction technique. COMPARISON:  09/20/2023 FINDINGS: CT HEAD FINDINGS Brain: No evidence of acute infarction, hemorrhage, hydrocephalus, extra-axial collection or mass lesion/mass effect. Low-density in the cerebral white matter attributed to chronic small vessel ischemia. Chronic lacunar infarct at the genu of the left internal capsule. Vascular: No  hyperdense vessel or unexpected calcification. Skull: Normal. Negative for fracture or focal lesion. Sinuses/Orbits: No evidence of injury CT CERVICAL SPINE FINDINGS Alignment: Straightening of cervical lordosis. Skull base and vertebrae: No acute fracture. No primary bone lesion or focal pathologic process. Soft tissues and spinal canal: No prevertebral fluid or swelling. No visible canal hematoma. Disc levels: Generalized degenerative disc narrowing and endplate degeneration. Generalized degenerative facet spurring. Multilevel foraminal impingement. Upper chest: Costovertebral ankylosis at upper thoracic levels. No acute finding IMPRESSION: No evidence of acute intracranial or cervical spine injury. Electronically Signed   By: Ronnette Coke M.D.   On: 04/05/2024 10:02   DG Pelvis 1-2 Views Result Date: 04/05/2024 CLINICAL DATA:  Fall. EXAM: PELVIS - 1-2  VIEW COMPARISON:  June 14, 2021. FINDINGS: There is no evidence of pelvic fracture or diastasis. No pelvic bone lesions are seen. IMPRESSION: Negative. Electronically Signed   By: Rosalene Colon M.D.   On: 04/05/2024 09:06   DG Chest Portable 1 View Result Date: 04/05/2024 CLINICAL DATA:  Fall. EXAM: PORTABLE CHEST 1 VIEW COMPARISON:  January 19, 2023. FINDINGS: Stable cardiomediastinal silhouette. Left lung is clear. Mild to moderate right pleural effusion is noted with associated atelectasis. Bony thorax is unremarkable. IMPRESSION: Mild to moderate right pleural effusion is noted with associated right basilar atelectasis. Electronically Signed   By: Rosalene Colon M.D.   On: 04/05/2024 09:04    Assessment/Plan:   Principal Problem:   Pleural effusion   Patient Summary: [name] is a 73 y.o. with a pertinent PMH of A-fib on A/C, type 2 diabetes, hypertension, ESRD on hemodialysis who presented from SNF after a fall, found to have right pleural effusion.   #Lymphocytic predominant exudative pleural effusion S/p thoracentesis with IR.  Pleural fluid  with bloody gross appearance,  increased white count with lymphocytic predominance.  Pleural LDH 197, serum LDH pending.  Suspect exudative effusion given  gross appearance, and cell count. Differential for lymphocytic exudative pleural effusion includes TB vs lymphoma vs. sarcoidosis.  He denies chronic cough, weight loss.  Former smoker.  Will get a CT chest with IV contrast to evaluate for malignancy.  - Follow-up CT of the chest.  #Fall Etiology unclear, orthostatic vitals unremarkable.  Has not been hypotensive.  - Monitor.  # CKD V on ESRD (Tue/Th/ Sat) # Normocytic anemia Mild hypokalemia, stable electrolytes.  I have consulted inpatient nephrologist for HD today.  -Monitor BMP.  Chronic medical conditions:   #Hypothyroidism -Levothyroxine 50 mcg.   #Hypertension - Lisinopril  40 mg. - Hydralazine  25 mg PRN  Diet:  Renal diet IVF: PO intake VTE: apixaban  (ELIQUIS ) tablet 5 mg  Code: Full PT/OT: Pending ID:  Anti-infectives (From admission, onward)    None       Anticipated discharge to Skilled nursing facility tomorrow pending CT of the chest..  Marni Sins, MD 04/06/2024, 8:14 AM Pager: [phone] [facility]

## 2024-04-06 NOTE — Plan of Care (Signed)
  Problem: Education: Goal: Knowledge of disease and its progression will improve Outcome: Not Progressing Goal: Individualized Educational Video(s) Outcome: Not Applicable

## 2024-04-06 NOTE — Consult Note (Signed)
 [facility] Renal Consultation Note    Indication for Consultation:  Management of ESRD/hemodialysis; anemia, hypertension/volume and secondary hyperparathyroidism PCP: Gaetana Jones [name] Nephrologist: Dr. Cindra Cree  HPI: [name] is a 72 y.o. male with ESRD on hemodialysis T,Th,S at [facility]. PMH: DMT2, CHF, Gout, PVD, MGUS.  Last HD 04/04/2024. He has not missed HD, some truncated treatments.  He was brought to ED after falling out of bed and striking head at SNF. He is on Eliquis . He did not lose conscious. Noted to be hypotensive with BP 72/42 at time of EMS evaluation.  CT of head without acute injury. R pleural effusion noted on CXR. CT of chest small-moderate R pleural effusion.  Right lower lobe collapse/consolidation is most likely related to atelectasis versus less likely pneumonia. A right middle lobe subpleural masslike soft tissue density is indeterminate. This may simply represent atelectasis. A neoplasm cannot be entirely excluded. IR consulted for thoracentesis. Net 625 cc transudative. He does not appear overtly volume overloaded by exam, but left HD 04/04/2024 under his EDW.  Chemistry unremarkable for ESRD other than hypokalemia which was addressed by primary. HGB 10.8 WBC 5.8 He has been admitted as OBS patient for fall, R pleural effusion. We will order and manage HD.   Past Medical History:  Diagnosis Date   Arthritis    Asthma    as a child   Chronic cystitis    Chronic diastolic heart failure (HCC)    Chronic kidney disease    HD pt, 3 times a week.   Diabetes mellitus    Dysrhythmia    GERD (gastroesophageal reflux disease)    H/O hiatal hernia    states it's been fixed   Hyperlipidemia    Hypertension    Lymphedema    Morbid obesity (HCC)    NEPHROLITHIASIS, HX OF 12/02/2009   Qualifier: Diagnosis of  By: [name], Cat     PVD (peripheral vascular disease) (HCC)    Renal insufficiency    UMBILICAL HERNIA 02/08/2007    Qualifier: History of  By: Peggy Bowens MD, Shane     Venous insufficiency    Past Surgical History:  Procedure Laterality Date   A/V FISTULAGRAM N/A 02/02/2024   Procedure: A/V Fistulagram;  Surgeon: Baron Border, MD;  Location: [facility] INVASIVE CV LAB;  Service: Cardiovascular;  Laterality: N/A;   ABDOMINAL AORTOGRAM W/LOWER EXTREMITY N/A 05/19/2017   Procedure: Abdominal Aortogram w/Lower Extremity;  Surgeon: Richrd Char, MD;  Location: [facility] INVASIVE CV LAB;  Service: Cardiovascular;  Laterality: N/A;   ABDOMINAL AORTOGRAM W/LOWER EXTREMITY N/A 06/18/2021   Procedure: ABDOMINAL AORTOGRAM W/LOWER EXTREMITY;  Surgeon: Richrd Char, MD;  Location: [facility] INVASIVE CV LAB;  Service: Cardiovascular;  Laterality: N/A;   ABDOMINAL AORTOGRAM W/LOWER EXTREMITY N/A 04/17/2023   Procedure: ABDOMINAL AORTOGRAM W/LOWER EXTREMITY;  Surgeon: Adine Hoof, MD;  Location: [facility] INVASIVE CV LAB;  Service: Cardiovascular;  Laterality: N/A;   ABDOMINAL AORTOGRAM W/LOWER EXTREMITY N/A 05/22/2023   Procedure: ABDOMINAL AORTOGRAM W/LOWER EXTREMITY;  Surgeon: Adine Hoof, MD;  Location: [facility] INVASIVE CV LAB;  Service: Cardiovascular;  Laterality: N/A;   AMPUTATION     Right and left fifth toes.    AMPUTATION Left 06/18/2021   Procedure: Amputation transmetatarsal of toes 2 3 and 4 left foot ;  Surgeon: Richrd Char, MD;  Location: [facility] OR;  Service: Vascular;  Laterality: Left;   AMPUTATION TOE     emoval of both little toes  AV FISTULA PLACEMENT Left 03/21/2014   Procedure: ARTERIOVENOUS (AV) FISTULA CREATION with ultrasound;  Surgeon: Mayo Speck, MD;  Location: [facility] OR;  Service: Vascular;  Laterality: Left;   COLONOSCOPY     EYE SURGERY Bilateral    cataract and lens implant   FEMORAL-TIBIAL BYPASS GRAFT Left 06/18/2021   Procedure: Left below-knee popliteal to posterior tibial artery bypass with 9 reversed left greater saphenous vein;  Surgeon: Richrd Char, MD;  Location: [facility] OR;  Service:  Vascular;  Laterality: Left;   HERNIA REPAIR     IR THORACENTESIS ASP PLEURAL SPACE W/IMG GUIDE  04/05/2024   LIGATION OF COMPETING BRANCHES OF ARTERIOVENOUS FISTULA Left 06/29/2015   Procedure: LIGATION OF LEFT ARM RADIOCEPHALIC ARTERIOVENOUS FISTULA SIDE BRANCHES;  Surgeon: Arvil Lauber, MD;  Location: [facility] OR;  Service: Vascular;  Laterality: Left;   MULTIPLE EXTRACTIONS WITH ALVEOLOPLASTY N/A 04/07/2017   Procedure: Extraction of tooth #'s 1-11, 13, 14,16, 20-23, and 26-28 with alveoloplasty;  Surgeon: Carol Chroman, DDS;  Location: [facility] OR;  Service: Oral Surgery;  Laterality: N/A;   PERIPHERAL VASCULAR INTERVENTION Right 04/17/2023   Procedure: PERIPHERAL VASCULAR INTERVENTION;  Surgeon: Adine Hoof, MD;  Location: [facility] INVASIVE CV LAB;  Service: Cardiovascular;  Laterality: Right;  Right SFA Stent   PERIPHERAL VASCULAR INTERVENTION Left 05/22/2023   Procedure: PERIPHERAL VASCULAR INTERVENTION;  Surgeon: Adine Hoof, MD;  Location: [facility] INVASIVE CV LAB;  Service: Cardiovascular;  Laterality: Left;  SFA   Popliteal to posterior tibial bypass     2006   R knee arthoscopic repair of meniscus     UMBILICAL HERNIA REPAIR     Family History  Problem Relation Age of Onset   Diabetes Mother    Heart disease Mother    Diabetes Brother    Social History:  reports that he quit smoking about 44 years ago. His smoking use included cigarettes. He has quit using smokeless tobacco. He reports that he does not drink alcohol and does not use drugs. Allergies  Allergen Reactions   Shellfish Allergy Swelling   Shrimp (Diagnostic) Swelling   Prior to Admission medications   Medication Sig Start Date End Date Taking? Authorizing Provider  acetaminophen  (TYLENOL ) 325 MG tablet Take 650 mg by mouth every 6 (six) hours as needed for mild pain (pain score 1-3) or fever.   Yes [provider]  apixaban  (ELIQUIS ) 5 MG TABS tablet Take 1 tablet (5 mg total) by mouth 2 (two) times  daily. 06/27/21  Yes Montey Apa, DO  aspirin  81 MG chewable tablet Chew 81 mg by mouth daily.   Yes [provider]  atorvastatin  (LIPITOR ) 80 MG tablet Take 80 mg by mouth at bedtime.   Yes [provider]  docusate sodium  (COLACE) 100 MG capsule Take 100 mg by mouth daily.   Yes [provider]  glucagon (GLUCAGEN) 1 MG SOLR injection Inject 1 mg into the vein daily as needed for low blood sugar.   Yes [provider]  guaiFENesin  (ROBITUSSIN) 100 MG/5ML liquid Take 15 mLs by mouth every 4 (four) hours as needed for cough or to loosen phlegm.   Yes [provider]  hydrALAZINE  (APRESOLINE ) 25 MG tablet Take 25 mg by mouth in the morning and at bedtime.   Yes [provider]  levothyroxine (SYNTHROID) 50 MCG tablet Take 50 mcg by mouth daily before breakfast.   Yes [provider]  lisinopril  (ZESTRIL ) 20 MG tablet Take 20 mg by mouth  See admin instructions. Take 20 mg by mouth once daily on Monday, Wednesday, Friday, and Sunday.   Yes [provider]  lisinopril  (ZESTRIL ) 40 MG tablet Take 40 mg by mouth at bedtime.   Yes [provider]  metoprolol  succinate (TOPROL -XL) 25 MG 24 hr tablet Take 1 tablet (25 mg total) by mouth daily. 06/28/21  Yes Montey Apa, DO  Multiple Vitamins-Minerals (MULTIVITAMIN WITH MINERALS) tablet Take 1 tablet by mouth daily in the afternoon.   Yes [provider]  sucroferric oxyhydroxide (VELPHORO ) 500 MG chewable tablet Chew 1,000 mg by mouth 3 (three) times daily with meals.   Yes [provider]   Current Facility-Administered Medications  Medication Dose Route Frequency Provider Last Rate Last Admin   acetaminophen  (TYLENOL ) tablet 650 mg  650 mg Oral Q6H PRN Nooruddin, Saad, MD       apixaban  (ELIQUIS ) tablet 5 mg  5 mg Oral BID Nooruddin, Saad, MD   5 mg at 04/06/24 0831   aspirin  chewable tablet 81 mg  81 mg Oral Daily Nooruddin, Saad, MD   81 mg at  04/06/24 0831   atorvastatin  (LIPITOR ) tablet 80 mg  80 mg Oral QHS Nooruddin, Saad, MD   80 mg at 04/05/24 2202   Chlorhexidine  Gluconate Cloth 2 % PADS 6 each  6 each Topical Q0600 Levorn Reason, MD   6 each at 04/06/24 0841   cinacalcet  (SENSIPAR ) tablet 30 mg  30 mg Oral Q T,Th,Sat-1800 Levorn Reason, MD       doxercalciferol  (HECTOROL ) injection 6 mcg  6 mcg Intravenous Q T,Th,Sa-HD Levorn Reason, MD       feeding supplement (ENSURE ENLIVE / ENSURE PLUS) liquid 237 mL  237 mL Oral BID BM Tod Forward C, DO   237 mL at 04/06/24 0831   hydrALAZINE  (APRESOLINE ) tablet 25 mg  25 mg Oral TID Nooruddin, Saad, MD   25 mg at 04/05/24 2202   levothyroxine (SYNTHROID) tablet 50 mcg  50 mcg Oral Q0600 Tod Forward C, DO   50 mcg at 04/06/24 0546   lisinopril  (ZESTRIL ) tablet 40 mg  40 mg Oral QHS Nooruddin, Saad, MD   40 mg at 04/05/24 2202   sucroferric oxyhydroxide (VELPHORO ) chewable tablet 1,000 mg  1,000 mg Oral TID WC Nooruddin, Dann Dust, MD   1,000 mg at 04/06/24 0831   Labs: Basic Metabolic Panel: Recent Labs  Lab 04/05/24 0849 04/05/24 1753 04/06/24 0603  NA 136 138 137  K 3.5 3.3* 3.4*  CL 96* 98 99  CO2 29 30 30   GLUCOSE 73 79 86  BUN 20 23 29*  CREATININE 5.48* 6.37* 7.13*  CALCIUM  8.8* 9.2 9.2   Liver Function Tests: Recent Labs  Lab 04/05/24 1753  AST 10*  ALT 8  ALKPHOS 35*  BILITOT 0.5  PROT 6.2*  ALBUMIN 2.9*   No results for input(s): "LIPASE", "AMYLASE" in the last 168 hours. No results for input(s): "AMMONIA" in the last 168 hours. CBC: Recent Labs  Lab 04/05/24 0849 04/06/24 0603  WBC 5.8 5.4  HGB 12.2* 10.8*  HCT 40.2 34.6*  MCV 95.3 94.3  PLT 146* 118*   Cardiac Enzymes: No results for input(s): "CKTOTAL", "CKMB", "CKMBINDEX", "TROPONINI" in the last 168 hours. CBG: Recent Labs  Lab 04/05/24 2013  GLUCAP 117*   Iron Studies: No results for input(s): "IRON", "TIBC", "TRANSFERRIN", "FERRITIN" in the last 72  hours. Studies/Results: CT CHEST W CONTRAST Result Date: 04/06/2024 CLINICAL DATA:  Pleural effusion.  Malignancy suspected. EXAM: CT CHEST WITH CONTRAST TECHNIQUE: Multidetector CT imaging of the chest was performed during intravenous contrast administration. RADIATION DOSE REDUCTION: This exam was performed according to the departmental dose-optimization program which includes automated exposure control, adjustment of the mA and/or kV according to patient size and/or use of iterative reconstruction technique. CONTRAST:  75mL OMNIPAQUE  IOHEXOL  350 MG/ML SOLN COMPARISON:  Plain film of 1 day prior.  CTA chest 07/07/2021 FINDINGS: Degradation secondary to minimal motion and patient arm position, not raised above the head. There is also EKG wire and lead artifact. Cardiovascular: Aortic atherosclerosis. Mild cardiomegaly, without pericardial effusion. Three vessel coronary artery calcification. No central pulmonary embolism, on this non-dedicated study. Mediastinum/Nodes: Periesophageal node at the level of the diaphragmatic hiatus measures 11 mm on 121/3 and is new. No hilar adenopathy. Lungs/Pleura: Small to moderate right-sided pleural effusion. Trace pleural air including on 105/3 is likely iatrogenic in the setting of recent thoracentesis. Dependent right lower lobe collapse/consolidation including on 105/4. Subpleural right middle masslike opacity including at 4.8 x 3.5 cm on 88/4. Trace fluid in the left major fissure. Calcified left diaphragmatic pleural plaque including on coronal image 66. Upper Abdomen: Normal imaged portions of the liver, spleen, stomach, left adrenal gland. Musculoskeletal: Advanced thoracic spondylosis. IMPRESSION: 1. Mild to moderate multifactorial degradation as detailed above. 2. Small to moderate right pleural effusion. Trace right pleural air is likely iatrogenic. 3. Right lower lobe collapse/consolidation is most likely related to atelectasis versus less likely pneumonia. A  right middle lobe subpleural masslike soft tissue density is indeterminate. This may simply represent atelectasis. A neoplasm cannot be entirely excluded. Potential clinical strategies include repeat thoracentesis with repeat CT after pleural fluid drainage versus a more aggressive approach with PET and/or bronchoscopy. 4. trace left pleural fluid with left pleural calcifications suggesting remote empyema or hemothorax. 5. Isolated nonspecific lower mediastinal/periesophageal adenopathy. 6. Incidental findings, including: Coronary artery atherosclerosis. Aortic Atherosclerosis (ICD10-I70.0). Electronically Signed   By: Lore Rode M.D.   On: 04/06/2024 09:27   IR THORACENTESIS ASP PLEURAL SPACE W/IMG GUIDE Result Date: 04/05/2024 INDICATION: 73 year old male presented to ED after a fall, with new pleural effusion noted. IR was requested for diagnostic and therapeutic thoracentesis. EXAM: ULTRASOUND GUIDED DIAGNOSTIC AND THERAPEUTIC THORACENTESIS MEDICATIONS: 6 cc of 1% lidocaine  COMPLICATIONS: None immediate. PROCEDURE: An ultrasound guided thoracentesis was thoroughly discussed with the patient and questions answered. The benefits, risks, alternatives and complications were also discussed. The patient understands and wishes to proceed with the procedure. Written consent was obtained. Ultrasound was performed to localize and mark an adequate pocket of fluid in the right chest. The area was then prepped and draped in the normal sterile fashion. 1% Lidocaine  was used for local anesthesia. Under ultrasound guidance a 6 Fr Safe-T-Centesis catheter was introduced. Thoracentesis was performed. The catheter was removed and a dressing applied. FINDINGS: A total of approximately 625 cc of opaque, blood laden pleural fluid was removed. Samples were sent to the laboratory as requested by the clinical team. IMPRESSION: Successful ultrasound guided right thoracentesis yielding 625 cc of pleural fluid. Procedure performed by  Lambert Pillion, PA-C Electronically Signed   By: Melven Stable.  Shick M.D.   On: 04/05/2024 16:03   DG Chest 1 View Result Date: 04/05/2024 CLINICAL DATA:  Status post thoracentesis.  Fall. EXAM: CHEST  1 VIEW COMPARISON:  Chest radiograph dated 04/05/2024. FINDINGS: Similar or slightly decreased right pleural effusion with right lung base atelectasis or infiltrate. No pneumothorax. Stable cardiac silhouette. No acute osseous pathology. IMPRESSION:  Similar or slightly decreased right pleural effusion. No pneumothorax. Electronically Signed   By: Angus Bark M.D.   On: 04/05/2024 15:14   CT Head Wo Contrast Result Date: 04/05/2024 CLINICAL DATA:  Minor head trauma, fall on blood thinner EXAM: CT HEAD WITHOUT CONTRAST CT CERVICAL SPINE WITHOUT CONTRAST TECHNIQUE: Multidetector CT imaging of the head and cervical spine was performed following the standard protocol without intravenous contrast. Multiplanar CT image reconstructions of the cervical spine were also generated. RADIATION DOSE REDUCTION: This exam was performed according to the departmental dose-optimization program which includes automated exposure control, adjustment of the mA and/or kV according to patient size and/or use of iterative reconstruction technique. COMPARISON:  09/20/2023 FINDINGS: CT HEAD FINDINGS Brain: No evidence of acute infarction, hemorrhage, hydrocephalus, extra-axial collection or mass lesion/mass effect. Low-density in the cerebral white matter attributed to chronic small vessel ischemia. Chronic lacunar infarct at the genu of the left internal capsule. Vascular: No hyperdense vessel or unexpected calcification. Skull: Normal. Negative for fracture or focal lesion. Sinuses/Orbits: No evidence of injury CT CERVICAL SPINE FINDINGS Alignment: Straightening of cervical lordosis. Skull base and vertebrae: No acute fracture. No primary bone lesion or focal pathologic process. Soft tissues and spinal canal: No prevertebral fluid or swelling.  No visible canal hematoma. Disc levels: Generalized degenerative disc narrowing and endplate degeneration. Generalized degenerative facet spurring. Multilevel foraminal impingement. Upper chest: Costovertebral ankylosis at upper thoracic levels. No acute finding IMPRESSION: No evidence of acute intracranial or cervical spine injury. Electronically Signed   By: Ronnette Coke M.D.   On: 04/05/2024 10:02   CT Cervical Spine Wo Contrast Result Date: 04/05/2024 CLINICAL DATA:  Minor head trauma, fall on blood thinner EXAM: CT HEAD WITHOUT CONTRAST CT CERVICAL SPINE WITHOUT CONTRAST TECHNIQUE: Multidetector CT imaging of the head and cervical spine was performed following the standard protocol without intravenous contrast. Multiplanar CT image reconstructions of the cervical spine were also generated. RADIATION DOSE REDUCTION: This exam was performed according to the departmental dose-optimization program which includes automated exposure control, adjustment of the mA and/or kV according to patient size and/or use of iterative reconstruction technique. COMPARISON:  09/20/2023 FINDINGS: CT HEAD FINDINGS Brain: No evidence of acute infarction, hemorrhage, hydrocephalus, extra-axial collection or mass lesion/mass effect. Low-density in the cerebral white matter attributed to chronic small vessel ischemia. Chronic lacunar infarct at the genu of the left internal capsule. Vascular: No hyperdense vessel or unexpected calcification. Skull: Normal. Negative for fracture or focal lesion. Sinuses/Orbits: No evidence of injury CT CERVICAL SPINE FINDINGS Alignment: Straightening of cervical lordosis. Skull base and vertebrae: No acute fracture. No primary bone lesion or focal pathologic process. Soft tissues and spinal canal: No prevertebral fluid or swelling. No visible canal hematoma. Disc levels: Generalized degenerative disc narrowing and endplate degeneration. Generalized degenerative facet spurring. Multilevel foraminal  impingement. Upper chest: Costovertebral ankylosis at upper thoracic levels. No acute finding IMPRESSION: No evidence of acute intracranial or cervical spine injury. Electronically Signed   By: Ronnette Coke M.D.   On: 04/05/2024 10:02   DG Pelvis 1-2 Views Result Date: 04/05/2024 CLINICAL DATA:  Fall. EXAM: PELVIS - 1-2 VIEW COMPARISON:  June 14, 2021. FINDINGS: There is no evidence of pelvic fracture or diastasis. No pelvic bone lesions are seen. IMPRESSION: Negative. Electronically Signed   By: Rosalene Colon M.D.   On: 04/05/2024 09:06   DG Chest Portable 1 View Result Date: 04/05/2024 CLINICAL DATA:  Fall. EXAM: PORTABLE CHEST 1 VIEW COMPARISON:  January 19, 2023. FINDINGS: Stable  cardiomediastinal silhouette. Left lung is clear. Mild to moderate right pleural effusion is noted with associated atelectasis. Bony thorax is unremarkable. IMPRESSION: Mild to moderate right pleural effusion is noted with associated right basilar atelectasis. Electronically Signed   By: Rosalene Colon M.D.   On: 04/05/2024 09:04    ROS: As per HPI otherwise negative.   Physical Exam: Vitals:   04/05/24 2011 04/05/24 2014 04/06/24 0520 04/06/24 0740  BP: 123/60  (!) 140/58 139/62  Pulse: 65  62 (!) 56  Resp: 20  20 18   Temp: 98.6 F (37 C)  98.3 F (36.8 C) 97.7 F (36.5 C)  TempSrc: Oral  Oral Oral  SpO2: 92%  93% 99%  Weight:  77.8 kg    Height:         General: Frail appearing old male in no acute distress. Head: Normocephalic, atraumatic, sclera non-icteric, mucus membranes are moist Neck: Supple. JVD not elevated. Lungs: CTAB A/P Heart: S1 S2 RRR no M/R/G Abdomen: NABS, NT. Lower extremities: Woody appearance BLE dry flaky skin, toe amps Neuro: Alert and oriented X 3. Moves all extremities spontaneously. Psych:  Responds to questions appropriately with a normal affect. Dialysis Access: L AVF +T/B  Dialysis Orders: East, T,Th, S 4 hrs 180NRe 450/Autoflow 1.5 74.6 kg 2.0 K/ 2.0 Ca AVF -  Heparin  3000 units IV initial bolus and 3000 units IV mid run - Hectorol  6 mcg IV three times per week - Sensipar  30 mg PO three times per week - Venofer  50 mg IV weekly  Assessment/Plan:  R pleural effusion: Work up per primary.   Mechanical Fall-CT of head without acute injury. Per primary  ESRD -  T, Th, S HD today on schedule  Hypertension/volume  - hypotensive when seen by EMS. BP stable now. Continue home meds. He seems to be losing wt, R pleural effusion. Will attempt to lower and optimize volume with HD. UF as tolerated.   Anemia  - HGB at goal. Follow labs.   Metabolic bone disease -  Continue VDRA, Sensipar , velphoro  binders. Add PO4 to labs.   Nutrition - Low albumin, low K+. Can have regular diet with fluid restrictions, protein supplement.  Margaree Sandhu H. Bevin Bucks, NP-C 04/06/2024, 12:17 PM  [employer] [phone]

## 2024-04-06 NOTE — Progress Notes (Signed)
Pt going to HD at this time.  [name] [name]

## 2024-04-06 NOTE — TOC Progression Note (Signed)
 Transition of Care [facility]) - Progression Note    Patient Details  Name: [name] MRN: [id number] Date of Birth: [birth year]  Transition of Care [facility]) CM/SW Contact  Jannine Meo, RN Phone Number: 04/06/2024, 2:15 PM  Clinical Narrative:   Attempt made to deliver moon letter, patient sleeping.         Expected Discharge Plan and Services                                               Social Determinants of Health (SDOH) Interventions SDOH Screenings   Food Insecurity: No Food Insecurity (04/05/2024)  Housing: Low Risk  (04/05/2024)  Transportation Needs: No Transportation Needs (04/05/2024)  Utilities: Not At Risk (04/05/2024)  Social Connections: Moderately Isolated (04/05/2024)  Tobacco Use: Medium Risk (04/05/2024)    Readmission Risk Interventions     No data to display

## 2024-04-06 NOTE — Progress Notes (Signed)
 Pt has lost IV access and 2 people from IV team cannot get/find another access peripherally. RN messaged MD on call to make aware.   Clarise Crooks Kesha Hurrell

## 2024-04-06 NOTE — Progress Notes (Addendum)
  IR BRIEF NOTE:  IR received request for chest tube placement. Reviewed and approved by Dr. Darylene Epley, IR attending. However, patient on Eliquis , and IR requests a 48 hr washout period for IR to place chest tube. Will hold Eliquis  now. Ok to remain on Aspirin .   After conferring with PCCM, Dr. Waylan Haggard, ok to proceed with repeat thoracentesis tomorrow (no max volume, remove as much as possible), and tentatively proceed with chest tube placement on Monday 4/28 if still indicated.   Electronically Signed: Lovena Rubinstein, PA-C 04/06/2024, 2:43 PM

## 2024-04-06 NOTE — Progress Notes (Signed)
  Echocardiogram 2D Echocardiogram has been performed.  Annis Kinder, RDCS 04/06/2024, 12:30 PM

## 2024-04-07 ENCOUNTER — Inpatient Hospital Stay (HOSPITAL_COMMUNITY)

## 2024-04-07 DIAGNOSIS — W19XXXD Unspecified fall, subsequent encounter: Secondary | ICD-10-CM | POA: Diagnosis not present

## 2024-04-07 DIAGNOSIS — Z992 Dependence on renal dialysis: Secondary | ICD-10-CM | POA: Diagnosis not present

## 2024-04-07 DIAGNOSIS — W19XXXA Unspecified fall, initial encounter: Secondary | ICD-10-CM

## 2024-04-07 DIAGNOSIS — J90 Pleural effusion, not elsewhere classified: Secondary | ICD-10-CM | POA: Diagnosis not present

## 2024-04-07 DIAGNOSIS — N186 End stage renal disease: Secondary | ICD-10-CM | POA: Diagnosis not present

## 2024-04-07 LAB — HEPATITIS B SURFACE ANTIBODY, QUANTITATIVE: Hep B S AB Quant (Post): 151 m[IU]/mL

## 2024-04-07 LAB — ACID FAST SMEAR (AFB, MYCOBACTERIA): Acid Fast Smear: NEGATIVE

## 2024-04-07 MED ORDER — LIDOCAINE HCL (PF) 1 % IJ SOLN
INTRAMUSCULAR | Status: AC
  Filled 2024-04-07: qty 30

## 2024-04-07 MED ORDER — LIDOCAINE HCL (PF) 1 % IJ SOLN: 10.0000 mL | Freq: Once | INTRAMUSCULAR | Status: DC

## 2024-04-07 MED ORDER — HYDROCERIN EX CREA
TOPICAL_CREAM | Freq: Two times a day (BID) | CUTANEOUS | Status: DC
  Administered 2024-04-09: 2 via TOPICAL
  Administered 2024-04-10: 1 via TOPICAL
  Administered 2024-04-16: 2 via TOPICAL
  Filled 2024-04-07: qty 113

## 2024-04-07 NOTE — Procedures (Signed)
 PROCEDURE SUMMARY:  Successful image-guided right thoracentesis. Yielded 625 cc of opaque, blood-laden fluid. Patient tolerated procedure well. EBL: Zero No immediate complications.  Post procedure CXR pending.  Please see imaging section of Epic for full dictation.  Gordy Lauber Tanzania Basham PA-C 04/07/2024 1:45 PM

## 2024-04-07 NOTE — Progress Notes (Signed)
 Received patient in bed to unit.  Alert and oriented.x3 Informed consent signed and in chart.   TX duration:2 hours 4 minutes  Patient tolerated well.  Transported back to the room  Alert, without acute distress, ended tx 1 hour early.  Hand-off given to patient's nurse.   Access used: avf Access issues: low arterial pressure  Total UF removed: 1500 Medication(s) given: hecterol 6mcg, heplock 3000 units Post HD VS: see table below Post HD weight: 78.0kg    04/07/24 0046  Vitals  Temp 98.4 F (36.9 C)  Temp Source Oral  BP (!) 140/57  MAP (mmHg) 81  BP Location Right Arm  BP Method Automatic  Patient Position (if appropriate) Lying  Pulse Rate Source Monitor  ECG Heart Rate (!) 58  Resp 18  Oxygen Therapy  SpO2 100 %  O2 Device Room Air  Patient Activity (if Appropriate) In bed  Pulse Oximetry Type Continuous  During Treatment Monitoring  Blood Flow Rate (mL/min) 0 mL/min  Arterial Pressure (mmHg) -1.41 mmHg  Venous Pressure (mmHg) -1.01 mmHg  TMP (mmHg) -50.5 mmHg  Ultrafiltration Rate (mL/min) 1199 mL/min  Dialysate Flow Rate (mL/min) 0 ml/min  Dialysate Potassium Concentration 3  Dialysate Calcium  Concentration 2.5  Duration of HD Treatment -hour(s) 2.07 hour(s)  Cumulative Fluid Removed (mL) per Treatment  1486.25  HD Safety Checks Performed Yes  Intra-Hemodialysis Comments See progress note  Post Treatment  Dialyzer Clearance Lightly streaked  Hemodialysis Intake (mL) 0 mL  Liters Processed 45.5  Fluid Removed (mL) 1500 mL  Tolerated HD Treatment No (Comment)  Post-Hemodialysis Comments goal not met  AVG/AVF Arterial Site Held (minutes) 10 minutes  AVG/AVF Venous Site Held (minutes) 6 minutes  Fistula / Graft Left Forearm  No Placement Date or Time found.   Orientation: Left  Access Location: Forearm  Site Condition No complications  Fistula / Graft Assessment Present;Thrill;Bruit;Aneurysm present  Status Deaccessed;Flushed;Patent      [facility]

## 2024-04-07 NOTE — Progress Notes (Signed)
 HD#2 Subjective:  Summary: [name] is a 73 year old male with past medical history of ESRD on hemodialysis, a fib on A/C, type 2 diabetes, and hypertension, who presented from skilled nursing facility[facility] by EMS after he had a fall,  and found to have exudative pleural effusion.  Overnight Events: NOE.  Pt seen bedside this AM, while he is eating his breakfast. States he is doing well, and denies any shortness of breath. He went for dialysis at 1AM.  Objective:  Vital signs in last 24 hours: Vitals:   04/07/24 0000 04/07/24 0030 04/07/24 0046 04/07/24 0356  BP: (!) 117/59 (!) 122/53 (!) 140/57 (!) 142/59  Pulse: (!) 55 (!) 137  (!) 55  Resp: 15 17 18 19   Temp:   98.4 F (36.9 C) 98.3 F (36.8 C)  TempSrc:   Oral Oral  SpO2: 97% (!) 71% 100% 93%  Weight:   78 kg 70.4 kg  Height:       Supplemental O2: Room Air SpO2: 93 %   Physical Exam:  Lying in bed, NAD.   Heart with regular rate and rhythm, no murmurs. Clear bilateral lungs, normal air movement. Soft, nontender abdomen. Lower extremities with bilateral dermatitis and chronic venous disease/lymphedema.3 L AVF access with palpable thrill  Filed Weights   04/06/24 2149 04/07/24 0046 04/07/24 0356  Weight: 79.4 kg 78 kg 70.4 kg     Intake/Output Summary (Last 24 hours) at 04/07/2024 0700 Last data filed at 04/07/2024 0600 Gross per 24 hour  Intake 300 ml  Output 1500 ml  Net -1200 ml   Net IO Since Admission: -1,020 mL [04/07/24 0700]  Recent Labs    04/05/24 2013  GLUCAP 117*     Pertinent Labs:    Latest Ref Rng & Units 04/06/2024    6:03 AM 04/05/2024    8:49 AM 02/02/2024   10:09 AM  CBC  WBC 4.0 - 10.5 K/uL 5.4  5.8    Hemoglobin 13.0 - 17.0 g/dL 54.0  98.1  19.1   Hematocrit 39.0 - 52.0 % 34.6  40.2  30.0   Platelets 150 - 400 K/uL 118  146         Latest Ref Rng & Units 04/06/2024    6:03 AM 04/05/2024    5:53 PM 04/05/2024    8:49 AM  CMP  Glucose 70 - 99 mg/dL 86  79  73   BUN  8 - 23 mg/dL 29  23  20    Creatinine 0.61 - 1.24 mg/dL 4.78  2.95  6.21   Sodium 135 - 145 mmol/L 137  138  136   Potassium 3.5 - 5.1 mmol/L 3.4  3.3  3.5   Chloride 98 - 111 mmol/L 99  98  96   CO2 22 - 32 mmol/L 30  30  29    Calcium  8.9 - 10.3 mg/dL 9.2  9.2  8.8   Total Protein 6.5 - 8.1 g/dL  6.2    Total Bilirubin 0.0 - 1.2 mg/dL  0.5    Alkaline Phos 38 - 126 U/L  35    AST 15 - 41 U/L  10    ALT 0 - 44 U/L  8      Imaging: ECHOCARDIOGRAM COMPLETE Result Date: 04/06/2024    ECHOCARDIOGRAM REPORT   Patient Name:   [name] Date of Exam: 04/06/2024 Medical Rec #:  [id number]       Height:       70.0 in Accession #:  [id number]      Weight:       171.5 lb Date of Birth:  Mar 05, 1951       BSA:          1.955 m Patient Age:    73 years        BP:           139/62 mmHg Patient Gender: M               HR:           63 bpm. Exam Location:  Inpatient Procedure: 2D Echo, Cardiac Doppler and Color Doppler (Both Spectral and Color            Flow Doppler were utilized during procedure). Indications:    I50.31 Acute diastolic (congestive) heart failure  History:        Patient has prior history of Echocardiogram examinations, most                 recent 06/16/2021. CHF, Chronic Kidney Disease; Risk                 Factors:Diabetes and Hypertension.  Sonographer:    Andrena Bang Referring Phys: Korene Dula IMPRESSIONS  1. Left ventricular ejection fraction, by estimation, is 60 to 65%. The left ventricle has normal function. The left ventricle has no regional wall motion abnormalities. Left ventricular diastolic parameters were normal.  2. Right ventricular systolic function is normal. The right ventricular size is normal. There is severely elevated pulmonary artery systolic pressure. The estimated right ventricular systolic pressure is 88.7 mmHg.  3. The mitral valve is normal in structure. Mild mitral valve regurgitation. No evidence of mitral stenosis.  4. Tricuspid valve regurgitation is moderate.   5. The aortic valve has an indeterminant number of cusps. There is moderate calcification of the aortic valve. There is moderate thickening of the aortic valve. Aortic valve regurgitation is not visualized. Aortic valve sclerosis is present, with no evidence of aortic valve stenosis. Aortic valve mean gradient measures 8.0 mmHg. Aortic valve Vmax measures 1.98 m/s.  6. The inferior vena cava is normal in size with greater than 50% respiratory variability, suggesting right atrial pressure of 3 mmHg. FINDINGS  Left Ventricle: Left ventricular ejection fraction, by estimation, is 60 to 65%. The left ventricle has normal function. The left ventricle has no regional wall motion abnormalities. The left ventricular internal cavity size was normal in size. There is  no left ventricular hypertrophy. Left ventricular diastolic parameters were normal. Right Ventricle: The right ventricular size is normal. No increase in right ventricular wall thickness. Right ventricular systolic function is normal. There is severely elevated pulmonary artery systolic pressure. The tricuspid regurgitant velocity is 4.63 m/s, and with an assumed right atrial pressure of 3 mmHg, the estimated right ventricular systolic pressure is 88.7 mmHg. Left Atrium: Left atrial size was normal in size. Right Atrium: Right atrial size was normal in size. Pericardium: There is no evidence of pericardial effusion. Mitral Valve: The mitral valve is normal in structure. Mild mitral valve regurgitation. No evidence of mitral valve stenosis. Tricuspid Valve: The tricuspid valve is normal in structure. Tricuspid valve regurgitation is moderate . No evidence of tricuspid stenosis. Aortic Valve: The aortic valve has an indeterminant number of cusps. There is moderate calcification of the aortic valve. There is moderate thickening of the aortic valve. Aortic valve regurgitation is not visualized. Aortic valve sclerosis is present, with no evidence of aortic valve  stenosis. Aortic valve  mean gradient measures 8.0 mmHg. Aortic valve peak gradient measures 15.7 mmHg. Aortic valve area, by VTI measures 1.55 cm. Pulmonic Valve: The pulmonic valve was normal in structure. Pulmonic valve regurgitation is not visualized. No evidence of pulmonic stenosis. Aorta: The aortic root is normal in size and structure. Venous: The inferior vena cava is normal in size with greater than 50% respiratory variability, suggesting right atrial pressure of 3 mmHg. IAS/Shunts: No atrial level shunt detected by color flow Doppler.  LEFT VENTRICLE PLAX 2D LVIDd:         4.50 cm      Diastology LVIDs:         2.80 cm      LV e' medial:    4.35 cm/s LV PW:         1.50 cm      LV E/e' medial:  29.9 LV IVS:        0.90 cm      LV e' lateral:   10.10 cm/s LVOT diam:     1.90 cm      LV E/e' lateral: 12.9 LV SV:         67 LV SV Index:   34 LVOT Area:     2.84 cm  LV Volumes (MOD) LV vol d, MOD A2C: 121.0 ml LV vol d, MOD A4C: 115.0 ml LV vol s, MOD A2C: 46.6 ml LV vol s, MOD A4C: 49.2 ml LV SV MOD A2C:     74.4 ml LV SV MOD A4C:     115.0 ml LV SV MOD BP:      72.8 ml RIGHT VENTRICLE RV S prime:     9.90 cm/s TAPSE (M-mode): 2.1 cm LEFT ATRIUM             Index LA diam:        3.50 cm 1.79 cm/m LA Vol (A2C):   83.9 ml 42.91 ml/m LA Vol (A4C):   37.6 ml 19.23 ml/m LA Biplane Vol: 55.6 ml 28.43 ml/m  AORTIC VALVE AV Area (Vmax):    1.50 cm AV Area (Vmean):   1.41 cm AV Area (VTI):     1.55 cm AV Vmax:           198.00 cm/s AV Vmean:          134.000 cm/s AV VTI:            0.434 m AV Peak Grad:      15.7 mmHg AV Mean Grad:      8.0 mmHg LVOT Vmax:         105.00 cm/s LVOT Vmean:        66.800 cm/s LVOT VTI:          0.237 m LVOT/AV VTI ratio: 0.55  AORTA Ao Asc diam: 3.20 cm MITRAL VALVE                TRICUSPID VALVE MV Area (PHT): 4.44 cm     TR Peak grad:   85.7 mmHg MV Decel Time: 171 msec     TR Vmax:        463.00 cm/s MV E velocity: 130.00 cm/s MV A velocity: 92.00 cm/s   SHUNTS MV E/A ratio:   1.41         Systemic VTI:  0.24 m                             Systemic Diam: 1.90  cm Dorothye Gathers MD Electronically signed by Dorothye Gathers MD Signature Date/Time: 04/06/2024/1:17:04 PM    Final    CT CHEST W CONTRAST Result Date: 04/06/2024 CLINICAL DATA:  Pleural effusion.  Malignancy suspected. EXAM: CT CHEST WITH CONTRAST TECHNIQUE: Multidetector CT imaging of the chest was performed during intravenous contrast administration. RADIATION DOSE REDUCTION: This exam was performed according to the departmental dose-optimization program which includes automated exposure control, adjustment of the mA and/or kV according to patient size and/or use of iterative reconstruction technique. CONTRAST:  75mL OMNIPAQUE  IOHEXOL  350 MG/ML SOLN COMPARISON:  Plain film of 1 day prior.  CTA chest 07/07/2021 FINDINGS: Degradation secondary to minimal motion and patient arm position, not raised above the head. There is also EKG wire and lead artifact. Cardiovascular: Aortic atherosclerosis. Mild cardiomegaly, without pericardial effusion. Three vessel coronary artery calcification. No central pulmonary embolism, on this non-dedicated study. Mediastinum/Nodes: Periesophageal node at the level of the diaphragmatic hiatus measures 11 mm on 121/3 and is new. No hilar adenopathy. Lungs/Pleura: Small to moderate right-sided pleural effusion. Trace pleural air including on 105/3 is likely iatrogenic in the setting of recent thoracentesis. Dependent right lower lobe collapse/consolidation including on 105/4. Subpleural right middle masslike opacity including at 4.8 x 3.5 cm on 88/4. Trace fluid in the left major fissure. Calcified left diaphragmatic pleural plaque including on coronal image 66. Upper Abdomen: Normal imaged portions of the liver, spleen, stomach, left adrenal gland. Musculoskeletal: Advanced thoracic spondylosis. IMPRESSION: 1. Mild to moderate multifactorial degradation as detailed above. 2. Small to moderate right pleural  effusion. Trace right pleural air is likely iatrogenic. 3. Right lower lobe collapse/consolidation is most likely related to atelectasis versus less likely pneumonia. A right middle lobe subpleural masslike soft tissue density is indeterminate. This may simply represent atelectasis. A neoplasm cannot be entirely excluded. Potential clinical strategies include repeat thoracentesis with repeat CT after pleural fluid drainage versus a more aggressive approach with PET and/or bronchoscopy. 4. trace left pleural fluid with left pleural calcifications suggesting remote empyema or hemothorax. 5. Isolated nonspecific lower mediastinal/periesophageal adenopathy. 6. Incidental findings, including: Coronary artery atherosclerosis. Aortic Atherosclerosis (ICD10-I70.0). Electronically Signed   By: Lore Rode M.D.   On: 04/06/2024 09:27    Assessment/Plan:   Principal Problem:   Pleural effusion Active Problems:   Stage 5 chronic kidney disease on chronic dialysis [facility])   Patient Summary: [name] is a 73 y.o. with a pertinent PMH of A-fib on A/C, type 2 diabetes, hypertension, ESRD on hemodialysis who presented from SNF after a fall, found to have right pleural effusion.   #Lymphocytic predominant exudative pleural effusion S/p thoracentesis with IR.  Pleural fluid  with bloody gross appearance,  increased white count with lymphocytic predominance.  Pleural LDH 197, serum LDH pending.  Suspect exudative effusion given  gross appearance, and cell count. Differential for lymphocytic exudative pleural effusion includes TB vs lymphoma vs. sarcoidosis.  He denies chronic cough, weight loss.  Former smoker.  Chest CT concerning for mass, pulmonology recommended repeat diagnostic thoracentesis today, and chest tube will be placed tomorrow after 48 hour eliquis  wash out.   Plan:  - Pulmonology and IR on board, appreciate recommendations  - Holding Eliquis  for now pending chest tube placement and washout  -  Thankfully, pt is asymptomatic    # ESRD (Tue/Th/ Sat) # Normocytic anemia Mild hypokalemia, stable electrolytes. HD last night without any complaints.  -Monitor BMP.  Chronic medical conditions:   #Hypothyroidism -Levothyroxine 50 mcg.   #Hypertension -  Lisinopril  40 mg. - Hydralazine  25 mg PRN  Diet: Renal diet IVF: PO intake VTE:  Code: Full PT/OT: Pending ID:  Anti-infectives (From admission, onward)    Start     Dose/Rate Route Frequency Ordered Stop   04/06/24 1800  Ampicillin-Sulbactam (UNASYN) 3 g in sodium chloride  0.9 % 100 mL IVPB        3 g 200 mL/hr over 30 Minutes Intravenous Every 24 hours 04/06/24 1444         Anticipated discharge to Skilled nursing facility tomorrow pending CT of the chest..  Patrick Palazzi, MD 04/07/2024, 7:00 AM Pager: [phone] [name]

## 2024-04-07 NOTE — Progress Notes (Signed)
 NAME:  [name], MRN:  [id number], DOB:  [birth year], LOS: 1 ADMISSION DATE:  04/05/2024, CONSULTATION DATE:  04/06/2024 REFERRING MD:  Kirt Pereyra MD, CHIEF COMPLAINT:   Pleural effusion, abnormal CT  History of Present Illness:   73 year old with history of end-stage renal disease on hemodialysis, hypothyroidism, hypertension, atrial fibrillation on Eliquis  Presenting after fall out of bed yesterday morning.  He fell on the right side and noted to have right-sided pleural effusion Underwent thoracentesis by IR yesterday with findings of bloody exudative effusion with lymphocyte predominance Follow-up CT shows possible lung mass and persistent moderate effusion and PCCM consulted for help with management.  He is a ex-smoker but quit at the age of 44.  Used to work in transportation.  May have had some exposure to asbestos in the past but cannot recall clearly Ongoing exposures.  Pertinent  Medical History    has a past medical history of Arthritis, Asthma, Chronic cystitis, Chronic diastolic heart failure (HCC), Chronic kidney disease, Diabetes mellitus, Dysrhythmia, GERD (gastroesophageal reflux disease), H/O hiatal hernia, Hyperlipidemia, Hypertension, Lymphedema, Morbid obesity (HCC), NEPHROLITHIASIS, HX OF (12/02/2009), PVD (peripheral vascular disease) (HCC), Renal insufficiency, UMBILICAL HERNIA (02/08/2007), and Venous insufficiency.   Significant Hospital Events: Including procedures, antibiotic start and stop dates in addition to other pertinent events   4/25 admit, right thoracentesis delayed due to anticoagulation 4/26 no issues overnight, awaiting IR thoracentesis with tentative plans for IR placed smallbore chest tube tomorrow  Interim History / Subjective:  Seen resting in bed with no acute complaints, reports a mild headache  Objective   Blood pressure 131/73, pulse (!) 56, temperature 98 F (36.7 C), temperature source Oral, resp. rate 18, height 5\' 10"  (1.778  m), weight 70.4 kg, SpO2 97%.        Intake/Output Summary (Last 24 hours) at 04/07/2024 1034 Last data filed at 04/07/2024 0600 Gross per 24 hour  Intake 300 ml  Output 1500 ml  Net -1200 ml   Filed Weights   04/06/24 2149 04/07/24 0046 04/07/24 0356  Weight: 79.4 kg 78 kg 70.4 kg    Examination: General: Acute on chronic ill-appearing deconditioned elderly male lying in bed in no acute distress HEENT: [place]/AT, MM pink/moist, PERRL,  Neuro: Alert and oriented x 3, nonfocal CV: s1s2 regular rate and rhythm, no murmur, rubs, or gallops,  PULM: Clear to auscultation bilaterally, diminished right base, no increased work of breathing, on room air GI: soft, bowel sounds active in all 4 quadrants, non-tender, non-distended, tolerating oral diet Extremities: warm/dry, no edema  Skin: no rashes or lesions   Pleural fluid 04/05/2024 Bloody, total nucleated cells 02/18/2007, 91% lymphs AFB cultures pending Cytology pending  CT chest 04/06/2024 postinfusion this moderate right-sided effusion trace pleural air, dependent right lower lobe consolidation/collapse, right middle masslike opacity  Resolved Hospital Problem list     Assessment & Plan:  Pleural effusion with possible hemothorax Pleural effusion on the right side with moderate fluid remaining post-thoracentesis and possible underlying mass. Differential diagnosis includes pneumonia, tuberculosis, rheumatoid arthritis, malignancy, or hemothorax due to recent fall and anticoagulation therapy.  He does not have any significant TB exposure history or symptoms of rheumatoid arthritis.  Bloody fluid raises suspicion for hemothorax P: Appreciate IR's assistance with procedures Collection of pleural fluid including Gram stain and culture with thoracentesis Eliquis  to remain on hold Continue empiric Unasyn Encouraged mobilization Pending repeat imaging post complete drainage of pleural effusion patient may require bronchoscopy  Best  Practice (right click and "Reselect all  SmartList Selections" daily)   Per primary team  Signature:  Yuette Putnam D. Harris, NP-C [facility] Personal contact information can be found on Amion  If no contact or response made please call [phone] 04/07/2024, 11:25 AM

## 2024-04-07 NOTE — Progress Notes (Signed)
 [facility] Progress Note   Subjective: Seen in room. Says he "fine". He is very pleasant, Denies SOB. S/P      Objective Vitals:   04/07/24 0030 04/07/24 0046 04/07/24 0356 04/07/24 0842  BP: (!) 122/53 (!) 140/57 (!) 142/59 131/73  Pulse: (!) 137  (!) 55 (!) 56  Resp: 17 18 19 18   Temp:  98.4 F (36.9 C) 98.3 F (36.8 C) 98 F (36.7 C)  TempSrc:  Oral Oral Oral  SpO2: (!) 71% 100% 93% 97%  Weight:  78 kg 70.4 kg   Height:        Additional Objective Labs: Basic Metabolic Panel: Recent Labs  Lab 04/05/24 0849 04/05/24 1753 04/06/24 0603  NA 136 138 137  K 3.5 3.3* 3.4*  CL 96* 98 99  CO2 29 30 30   GLUCOSE 73 79 86  BUN 20 23 29*  CREATININE 5.48* 6.37* 7.13*  CALCIUM  8.8* 9.2 9.2   Liver Function Tests: Recent Labs  Lab 04/05/24 1753  AST 10*  ALT 8  ALKPHOS 35*  BILITOT 0.5  PROT 6.2*  ALBUMIN 2.9*   No results for input(s): "LIPASE", "AMYLASE" in the last 168 hours. CBC: Recent Labs  Lab 04/05/24 0849 04/06/24 0603  WBC 5.8 5.4  HGB 12.2* 10.8*  HCT 40.2 34.6*  MCV 95.3 94.3  PLT 146* 118*   Blood Culture    Component Value Date/Time   SDES FLUID 04/06/2024 1346   SPECREQUEST NONE 04/06/2024 1346   CULT PENDING 04/06/2024 1346   REPTSTATUS PENDING 04/06/2024 1346    Cardiac Enzymes: No results for input(s): "CKTOTAL", "CKMB", "CKMBINDEX", "TROPONINI" in the last 168 hours. CBG: Recent Labs  Lab 04/05/24 2013  GLUCAP 117*   Iron Studies: No results for input(s): "IRON", "TIBC", "TRANSFERRIN", "FERRITIN" in the last 72 hours. @lablastinr3 @ Studies/Results: ECHOCARDIOGRAM COMPLETE Result Date: 04/06/2024    ECHOCARDIOGRAM REPORT   Patient Name:   [name] Date of Exam: 04/06/2024 Medical Rec #:  [id number]       Height:       70.0 in Accession #:    [id number]      Weight:       171.5 lb Date of Birth:  Jan 10, 1951       BSA:          1.955 m Patient Age:    73 years        BP:           139/62 mmHg Patient Gender:  M               HR:           63 bpm. Exam Location:  Inpatient Procedure: 2D Echo, Cardiac Doppler and Color Doppler (Both Spectral and Color            Flow Doppler were utilized during procedure). Indications:    I50.31 Acute diastolic (congestive) heart failure  History:        Patient has prior history of Echocardiogram examinations, most                 recent 06/16/2021. CHF, Chronic Kidney Disease; Risk                 Factors:Diabetes and Hypertension.  Sonographer:    Andrena Bang Referring Phys: SAAD NOORUDDIN IMPRESSIONS  1. Left ventricular ejection fraction, by estimation, is 60 to 65%. The left ventricle has normal function. The left ventricle has no regional wall motion abnormalities.  Left ventricular diastolic parameters were normal.  2. Right ventricular systolic function is normal. The right ventricular size is normal. There is severely elevated pulmonary artery systolic pressure. The estimated right ventricular systolic pressure is 88.7 mmHg.  3. The mitral valve is normal in structure. Mild mitral valve regurgitation. No evidence of mitral stenosis.  4. Tricuspid valve regurgitation is moderate.  5. The aortic valve has an indeterminant number of cusps. There is moderate calcification of the aortic valve. There is moderate thickening of the aortic valve. Aortic valve regurgitation is not visualized. Aortic valve sclerosis is present, with no evidence of aortic valve stenosis. Aortic valve mean gradient measures 8.0 mmHg. Aortic valve Vmax measures 1.98 m/s.  6. The inferior vena cava is normal in size with greater than 50% respiratory variability, suggesting right atrial pressure of 3 mmHg. FINDINGS  Left Ventricle: Left ventricular ejection fraction, by estimation, is 60 to 65%. The left ventricle has normal function. The left ventricle has no regional wall motion abnormalities. The left ventricular internal cavity size was normal in size. There is  no left ventricular hypertrophy. Left ventricular  diastolic parameters were normal. Right Ventricle: The right ventricular size is normal. No increase in right ventricular wall thickness. Right ventricular systolic function is normal. There is severely elevated pulmonary artery systolic pressure. The tricuspid regurgitant velocity is 4.63 m/s, and with an assumed right atrial pressure of 3 mmHg, the estimated right ventricular systolic pressure is 88.7 mmHg. Left Atrium: Left atrial size was normal in size. Right Atrium: Right atrial size was normal in size. Pericardium: There is no evidence of pericardial effusion. Mitral Valve: The mitral valve is normal in structure. Mild mitral valve regurgitation. No evidence of mitral valve stenosis. Tricuspid Valve: The tricuspid valve is normal in structure. Tricuspid valve regurgitation is moderate . No evidence of tricuspid stenosis. Aortic Valve: The aortic valve has an indeterminant number of cusps. There is moderate calcification of the aortic valve. There is moderate thickening of the aortic valve. Aortic valve regurgitation is not visualized. Aortic valve sclerosis is present, with no evidence of aortic valve stenosis. Aortic valve mean gradient measures 8.0 mmHg. Aortic valve peak gradient measures 15.7 mmHg. Aortic valve area, by VTI measures 1.55 cm. Pulmonic Valve: The pulmonic valve was normal in structure. Pulmonic valve regurgitation is not visualized. No evidence of pulmonic stenosis. Aorta: The aortic root is normal in size and structure. Venous: The inferior vena cava is normal in size with greater than 50% respiratory variability, suggesting right atrial pressure of 3 mmHg. IAS/Shunts: No atrial level shunt detected by color flow Doppler.  LEFT VENTRICLE PLAX 2D LVIDd:         4.50 cm      Diastology LVIDs:         2.80 cm      LV e' medial:    4.35 cm/s LV PW:         1.50 cm      LV E/e' medial:  29.9 LV IVS:        0.90 cm      LV e' lateral:   10.10 cm/s LVOT diam:     1.90 cm      LV E/e' lateral:  12.9 LV SV:         67 LV SV Index:   34 LVOT Area:     2.84 cm  LV Volumes (MOD) LV vol d, MOD A2C: 121.0 ml LV vol d, MOD A4C: 115.0 ml LV vol s,  MOD A2C: 46.6 ml LV vol s, MOD A4C: 49.2 ml LV SV MOD A2C:     74.4 ml LV SV MOD A4C:     115.0 ml LV SV MOD BP:      72.8 ml RIGHT VENTRICLE RV S prime:     9.90 cm/s TAPSE (M-mode): 2.1 cm LEFT ATRIUM             Index LA diam:        3.50 cm 1.79 cm/m LA Vol (A2C):   83.9 ml 42.91 ml/m LA Vol (A4C):   37.6 ml 19.23 ml/m LA Biplane Vol: 55.6 ml 28.43 ml/m  AORTIC VALVE AV Area (Vmax):    1.50 cm AV Area (Vmean):   1.41 cm AV Area (VTI):     1.55 cm AV Vmax:           198.00 cm/s AV Vmean:          134.000 cm/s AV VTI:            0.434 m AV Peak Grad:      15.7 mmHg AV Mean Grad:      8.0 mmHg LVOT Vmax:         105.00 cm/s LVOT Vmean:        66.800 cm/s LVOT VTI:          0.237 m LVOT/AV VTI ratio: 0.55  AORTA Ao Asc diam: 3.20 cm MITRAL VALVE                TRICUSPID VALVE MV Area (PHT): 4.44 cm     TR Peak grad:   85.7 mmHg MV Decel Time: 171 msec     TR Vmax:        463.00 cm/s MV E velocity: 130.00 cm/s MV A velocity: 92.00 cm/s   SHUNTS MV E/A ratio:  1.41         Systemic VTI:  0.24 m                             Systemic Diam: 1.90 cm Dorothye Gathers MD Electronically signed by Dorothye Gathers MD Signature Date/Time: 04/06/2024/1:17:04 PM    Final    CT CHEST W CONTRAST Result Date: 04/06/2024 CLINICAL DATA:  Pleural effusion.  Malignancy suspected. EXAM: CT CHEST WITH CONTRAST TECHNIQUE: Multidetector CT imaging of the chest was performed during intravenous contrast administration. RADIATION DOSE REDUCTION: This exam was performed according to the departmental dose-optimization program which includes automated exposure control, adjustment of the mA and/or kV according to patient size and/or use of iterative reconstruction technique. CONTRAST:  75mL OMNIPAQUE  IOHEXOL  350 MG/ML SOLN COMPARISON:  Plain film of 1 day prior.  CTA chest 07/07/2021 FINDINGS:  Degradation secondary to minimal motion and patient arm position, not raised above the head. There is also EKG wire and lead artifact. Cardiovascular: Aortic atherosclerosis. Mild cardiomegaly, without pericardial effusion. Three vessel coronary artery calcification. No central pulmonary embolism, on this non-dedicated study. Mediastinum/Nodes: Periesophageal node at the level of the diaphragmatic hiatus measures 11 mm on 121/3 and is new. No hilar adenopathy. Lungs/Pleura: Small to moderate right-sided pleural effusion. Trace pleural air including on 105/3 is likely iatrogenic in the setting of recent thoracentesis. Dependent right lower lobe collapse/consolidation including on 105/4. Subpleural right middle masslike opacity including at 4.8 x 3.5 cm on 88/4. Trace fluid in the left major fissure. Calcified left diaphragmatic pleural plaque including on coronal image 66. Upper Abdomen: Normal  imaged portions of the liver, spleen, stomach, left adrenal gland. Musculoskeletal: Advanced thoracic spondylosis. IMPRESSION: 1. Mild to moderate multifactorial degradation as detailed above. 2. Small to moderate right pleural effusion. Trace right pleural air is likely iatrogenic. 3. Right lower lobe collapse/consolidation is most likely related to atelectasis versus less likely pneumonia. A right middle lobe subpleural masslike soft tissue density is indeterminate. This may simply represent atelectasis. A neoplasm cannot be entirely excluded. Potential clinical strategies include repeat thoracentesis with repeat CT after pleural fluid drainage versus a more aggressive approach with PET and/or bronchoscopy. 4. trace left pleural fluid with left pleural calcifications suggesting remote empyema or hemothorax. 5. Isolated nonspecific lower mediastinal/periesophageal adenopathy. 6. Incidental findings, including: Coronary artery atherosclerosis. Aortic Atherosclerosis (ICD10-I70.0). Electronically Signed   By: Lore Rode M.D.    On: 04/06/2024 09:27   IR THORACENTESIS ASP PLEURAL SPACE W/IMG GUIDE Result Date: 04/05/2024 INDICATION: 73 year old male presented to ED after a fall, with new pleural effusion noted. IR was requested for diagnostic and therapeutic thoracentesis. EXAM: ULTRASOUND GUIDED DIAGNOSTIC AND THERAPEUTIC THORACENTESIS MEDICATIONS: 6 cc of 1% lidocaine  COMPLICATIONS: None immediate. PROCEDURE: An ultrasound guided thoracentesis was thoroughly discussed with the patient and questions answered. The benefits, risks, alternatives and complications were also discussed. The patient understands and wishes to proceed with the procedure. Written consent was obtained. Ultrasound was performed to localize and mark an adequate pocket of fluid in the right chest. The area was then prepped and draped in the normal sterile fashion. 1% Lidocaine  was used for local anesthesia. Under ultrasound guidance a 6 Fr Safe-T-Centesis catheter was introduced. Thoracentesis was performed. The catheter was removed and a dressing applied. FINDINGS: A total of approximately 625 cc of opaque, blood laden pleural fluid was removed. Samples were sent to the laboratory as requested by the clinical team. IMPRESSION: Successful ultrasound guided right thoracentesis yielding 625 cc of pleural fluid. Procedure performed by Lambert Pillion, PA-C Electronically Signed   By: Melven Stable.  Shick M.D.   On: 04/05/2024 16:03   DG Chest 1 View Result Date: 04/05/2024 CLINICAL DATA:  Status post thoracentesis.  Fall. EXAM: CHEST  1 VIEW COMPARISON:  Chest radiograph dated 04/05/2024. FINDINGS: Similar or slightly decreased right pleural effusion with right lung base atelectasis or infiltrate. No pneumothorax. Stable cardiac silhouette. No acute osseous pathology. IMPRESSION: Similar or slightly decreased right pleural effusion. No pneumothorax. Electronically Signed   By: Angus Bark M.D.   On: 04/05/2024 15:14   Medications:  ampicillin-sulbactam (UNASYN) IV 3 g  (04/06/24 1706)    (feeding supplement) PROSource Plus  30 mL Oral BID BM   aspirin   81 mg Oral Daily   atorvastatin   80 mg Oral QHS   Chlorhexidine  Gluconate Cloth  6 each Topical Q0600   cinacalcet   30 mg Oral Q T,Th,Sat-1800   doxercalciferol   6 mcg Intravenous Q T,Th,Sa-HD   feeding supplement (NEPRO CARB STEADY)  237 mL Oral BID BM   heparin  sodium (porcine)  2,000 Units Intra-arterial Once   hydrALAZINE   25 mg Oral TID   hydrocerin   Topical BID   levothyroxine  50 mcg Oral Q0600   lisinopril   40 mg Oral QHS   multivitamin  1 tablet Oral QHS   sucroferric oxyhydroxide  1,000 mg Oral TID WC     General: Frail appearing old male in no acute distress. Head: Normocephalic, atraumatic, sclera non-icteric, mucus membranes are moist Neck: Supple. JVD not elevated. Lungs: CTAB A/P Heart: S1 S2 RRR no M/R/G Abdomen: NABS, NT.  Lower extremities: Woody appearance BLE dry flaky skin, toe amps Neuro: Alert and oriented X 3. Moves all extremities spontaneously. Psych:  Responds to questions appropriately with a normal affect. Dialysis Access: L AVF +T/B   Dialysis Orders: [place], T,Th, S 4 hrs 180NRe 450/Autoflow 1.5 74.6 kg 2.0 K/ 2.0 Ca AVF - Heparin  3000 units IV initial bolus and 3000 units IV mid run - Hectorol  6 mcg IV three times per week - Sensipar  30 mg PO three times per week - Venofer  50 mg IV weekly   Assessment/Plan:  R pleural effusion with possible hemothorax:Seen by PCCM. CT placement tomorrow after Eliquis  washout. W/O and ABX per primary   Hypokalemia: present on admission. Follow labs.   Mechanical Fall-CT of head without acute injury. Per primary  ESRD -  T, Th, S Next HD 04/09/2024  Hypertension/volume  - hypotensive when seen by EMS. BP stable now. Continue home meds. He seems to be losing wt, R pleural effusion. Will attempt to lower and optimize volume with HD. UF as tolerated.   Anemia  - HGB at goal. Follow labs.   Metabolic bone disease -  Continue VDRA,  Sensipar , velphoro  binders. Add PO4 to labs.   Nutrition - Low albumin, low K+. Can have regular diet with fluid restrictions, protein supplement.    Jearld Hemp H. Ronit Cranfield NP-C 04/07/2024, 10:55 AM  [employer] [phone]

## 2024-04-07 NOTE — Progress Notes (Signed)
 Patient refused Medication. States he will "pass" tonight. CN and MD on call notified.

## 2024-04-08 ENCOUNTER — Inpatient Hospital Stay (HOSPITAL_COMMUNITY)

## 2024-04-08 DIAGNOSIS — N186 End stage renal disease: Secondary | ICD-10-CM | POA: Diagnosis not present

## 2024-04-08 DIAGNOSIS — Z992 Dependence on renal dialysis: Secondary | ICD-10-CM | POA: Diagnosis not present

## 2024-04-08 DIAGNOSIS — J90 Pleural effusion, not elsewhere classified: Secondary | ICD-10-CM | POA: Diagnosis not present

## 2024-04-08 DIAGNOSIS — Z87891 Personal history of nicotine dependence: Secondary | ICD-10-CM | POA: Diagnosis not present

## 2024-04-08 DIAGNOSIS — W19XXXA Unspecified fall, initial encounter: Secondary | ICD-10-CM | POA: Diagnosis not present

## 2024-04-08 DIAGNOSIS — R918 Other nonspecific abnormal finding of lung field: Secondary | ICD-10-CM | POA: Diagnosis not present

## 2024-04-08 LAB — RENAL FUNCTION PANEL
Albumin: 2.7 g/dL — ABNORMAL LOW (ref 3.5–5.0)
Anion gap: 12 (ref 5–15)
BUN: 33 mg/dL — ABNORMAL HIGH (ref 8–23)
CO2: 24 mmol/L (ref 22–32)
Calcium: 9.1 mg/dL (ref 8.9–10.3)
Chloride: 97 mmol/L — ABNORMAL LOW (ref 98–111)
Creatinine, Ser: 7.55 mg/dL — ABNORMAL HIGH (ref 0.61–1.24)
GFR, Estimated: 7 mL/min — ABNORMAL LOW (ref >60)
Glucose, Bld: 79 mg/dL (ref 70–99)
Phosphorus: 5.4 mg/dL — ABNORMAL HIGH (ref 2.5–4.6)
Potassium: 4.2 mmol/L (ref 3.5–5.1)
Sodium: 133 mmol/L — ABNORMAL LOW (ref 135–145)

## 2024-04-08 LAB — HEMATOCRIT: HCT: 38.3 % — ABNORMAL LOW (ref 39.0–52.0)

## 2024-04-08 LAB — CBC WITH DIFFERENTIAL/PLATELET
Abs Immature Granulocytes: 0.02 10*3/uL (ref 0.00–0.07)
Basophils Absolute: 0 10*3/uL (ref 0.0–0.1)
Basophils Relative: 1 %
Eosinophils Absolute: 0.4 10*3/uL (ref 0.0–0.5)
Eosinophils Relative: 5 %
HCT: 37 % — ABNORMAL LOW (ref 39.0–52.0)
Hemoglobin: 11.7 g/dL — ABNORMAL LOW (ref 13.0–17.0)
Immature Granulocytes: 0 %
Lymphocytes Relative: 14 %
Lymphs Abs: 1 10*3/uL (ref 0.7–4.0)
MCH: 29.7 pg (ref 26.0–34.0)
MCHC: 31.6 g/dL (ref 30.0–36.0)
MCV: 93.9 fL (ref 80.0–100.0)
Monocytes Absolute: 0.7 10*3/uL (ref 0.1–1.0)
Monocytes Relative: 10 %
Neutro Abs: 4.9 10*3/uL (ref 1.7–7.7)
Neutrophils Relative %: 70 %
Platelets: 126 10*3/uL — ABNORMAL LOW (ref 150–400)
RBC: 3.94 MIL/uL — ABNORMAL LOW (ref 4.22–5.81)
RDW: 16.3 % — ABNORMAL HIGH (ref 11.5–15.5)
WBC: 7 10*3/uL (ref 4.0–10.5)
nRBC: 0 % (ref 0.0–0.2)

## 2024-04-08 LAB — TRIGLYCERIDES, BODY FLUIDS: Triglycerides, Fluid: 29 mg/dL

## 2024-04-08 LAB — CYTOLOGY - NON PAP

## 2024-04-08 MED ORDER — HYDROMORPHONE HCL 2 MG PO TABS
1.0000 mg | ORAL_TABLET | Freq: Four times a day (QID) | ORAL | Status: DC | PRN
  Administered 2024-04-10: 1 mg via ORAL
  Filled 2024-04-08: qty 1

## 2024-04-08 MED ORDER — CHLORHEXIDINE GLUCONATE CLOTH 2 % EX PADS
6.0000 | MEDICATED_PAD | Freq: Every day | CUTANEOUS | Status: DC
  Administered 2024-04-09: 6 via TOPICAL

## 2024-04-08 NOTE — Progress Notes (Signed)
 Mobility Specialist Progress Note:    04/08/24 1000  Mobility  Activity Transferred from bed to chair  Level of Assistance Minimal assist, patient does 75% or more  Assistive Device Other (Comment) (HHA)  Distance Ambulated (ft) 4 ft  Activity Response Tolerated well  Mobility Referral Yes  Mobility visit 1 Mobility  Mobility Specialist Start Time (ACUTE ONLY) 0827  Mobility Specialist Stop Time (ACUTE ONLY) [id number]  Mobility Specialist Time Calculation (min) (ACUTE ONLY) 5 min   Pt received in bed and agreeable. Declined ambulation d/t "having a bad R knee". Able to stand and pivot to chair w/ HHA minA. C/o some R knee pain. Pt left in chair with call bell and all needs met.  D'Vante Nolon Baxter Mobility Specialist Please contact via Special educational needs teacher or Rehab office at [phone]

## 2024-04-08 NOTE — Consult Note (Cosign Needed Addendum)
 Chief Complaint: Patient was seen in consultation today for recurrent pleural effusion, with consideration for chest tube placement.   Referring Provider(s): Dr. Louie Rover, MD   Supervising Physician: Marland Silvas  Patient Status: [facility] - In-pt  Patient is Full Code  History of Present Illness: [name] is a 73 y.o. male  with PMHx notable for HTN, HLD, PVD, CHF, CKD/ESRD on HD, T2DM, GERD, nephrolithiasis, arthritis, asthma, and venous insufficiency.  Patient presented to ED on 4/25 after falling out of bed. Patient was on Eliquis . CXR was notable for incidental right pleural effusion. Patient underwent thoracentesis on 4/25, and again on 4/27. Recovered pleural fluid was blood-laden. Patient was recommended for chest tube placement. Patient is amenable.  Interventional Radiology was requested for chest tube placement. Request was reviewed and approved by Dr. Jinx Mourning. Patient is tentatively scheduled for same in IR tomorrow.   Patient is currently without any significant complaints. Patient is alert and sitting up in chair, calm. Patient denies any fevers, headache, chest pain, SOB, cough, abdominal pain, nausea, vomiting or bleeding.     Past Medical History:  Diagnosis Date   Arthritis    Asthma    as a child   Chronic cystitis    Chronic diastolic heart failure (HCC)    Chronic kidney disease    HD pt, 3 times a week.   Diabetes mellitus    Dysrhythmia    GERD (gastroesophageal reflux disease)    H/O hiatal hernia    states it's been fixed   Hyperlipidemia    Hypertension    Lymphedema    Morbid obesity (HCC)    NEPHROLITHIASIS, HX OF 12/02/2009   Qualifier: Diagnosis of  By: [name], Cat     PVD (peripheral vascular disease) (HCC)    Renal insufficiency    UMBILICAL HERNIA 02/08/2007   Qualifier: History of  By: Peggy Bowens MD, Shane     Venous insufficiency     Past Surgical History:  Procedure Laterality Date   A/V FISTULAGRAM N/A 02/02/2024    Procedure: A/V Fistulagram;  Surgeon: Baron Border, MD;  Location: [facility] INVASIVE CV LAB;  Service: Cardiovascular;  Laterality: N/A;   ABDOMINAL AORTOGRAM W/LOWER EXTREMITY N/A 05/19/2017   Procedure: Abdominal Aortogram w/Lower Extremity;  Surgeon: Richrd Char, MD;  Location: [facility] INVASIVE CV LAB;  Service: Cardiovascular;  Laterality: N/A;   ABDOMINAL AORTOGRAM W/LOWER EXTREMITY N/A 06/18/2021   Procedure: ABDOMINAL AORTOGRAM W/LOWER EXTREMITY;  Surgeon: Richrd Char, MD;  Location: [facility] INVASIVE CV LAB;  Service: Cardiovascular;  Laterality: N/A;   ABDOMINAL AORTOGRAM W/LOWER EXTREMITY N/A 04/17/2023   Procedure: ABDOMINAL AORTOGRAM W/LOWER EXTREMITY;  Surgeon: Adine Hoof, MD;  Location: [facility] INVASIVE CV LAB;  Service: Cardiovascular;  Laterality: N/A;   ABDOMINAL AORTOGRAM W/LOWER EXTREMITY N/A 05/22/2023   Procedure: ABDOMINAL AORTOGRAM W/LOWER EXTREMITY;  Surgeon: Adine Hoof, MD;  Location: [facility] INVASIVE CV LAB;  Service: Cardiovascular;  Laterality: N/A;   AMPUTATION     Right and left fifth toes.    AMPUTATION Left 06/18/2021   Procedure: Amputation transmetatarsal of toes 2 3 and 4 left foot ;  Surgeon: Richrd Char, MD;  Location: [facility] OR;  Service: Vascular;  Laterality: Left;   AMPUTATION TOE     emoval of both little toes   AV FISTULA PLACEMENT Left 03/21/2014   Procedure: ARTERIOVENOUS (AV) FISTULA CREATION with ultrasound;  Surgeon: Mayo Speck, MD;  Location: [facility] OR;  Service: Vascular;  Laterality: Left;  COLONOSCOPY     EYE SURGERY Bilateral    cataract and lens implant   FEMORAL-TIBIAL BYPASS GRAFT Left 06/18/2021   Procedure: Left below-knee popliteal to posterior tibial artery bypass with 9 reversed left greater saphenous vein;  Surgeon: Richrd Char, MD;  Location: [facility] OR;  Service: Vascular;  Laterality: Left;   HERNIA REPAIR     IR THORACENTESIS ASP PLEURAL SPACE W/IMG GUIDE  04/05/2024   LIGATION OF COMPETING BRANCHES OF ARTERIOVENOUS  FISTULA Left 06/29/2015   Procedure: LIGATION OF LEFT ARM RADIOCEPHALIC ARTERIOVENOUS FISTULA SIDE BRANCHES;  Surgeon: Arvil Lauber, MD;  Location: [facility] OR;  Service: Vascular;  Laterality: Left;   MULTIPLE EXTRACTIONS WITH ALVEOLOPLASTY N/A 04/07/2017   Procedure: Extraction of tooth #'s 1-11, 13, 14,16, 20-23, and 26-28 with alveoloplasty;  Surgeon: Carol Chroman, DDS;  Location: [facility] OR;  Service: Oral Surgery;  Laterality: N/A;   PERIPHERAL VASCULAR INTERVENTION Right 04/17/2023   Procedure: PERIPHERAL VASCULAR INTERVENTION;  Surgeon: Adine Hoof, MD;  Location: [facility] INVASIVE CV LAB;  Service: Cardiovascular;  Laterality: Right;  Right SFA Stent   PERIPHERAL VASCULAR INTERVENTION Left 05/22/2023   Procedure: PERIPHERAL VASCULAR INTERVENTION;  Surgeon: Adine Hoof, MD;  Location: [facility] INVASIVE CV LAB;  Service: Cardiovascular;  Laterality: Left;  SFA   Popliteal to posterior tibial bypass     2006   R knee arthoscopic repair of meniscus     UMBILICAL HERNIA REPAIR      Allergies: Shellfish allergy and Shrimp (diagnostic)  Medications: Prior to Admission medications   Medication Sig Start Date End Date Taking? Authorizing Provider  acetaminophen  (TYLENOL ) 325 MG tablet Take 650 mg by mouth every 6 (six) hours as needed for mild pain (pain score 1-3) or fever.   Yes [provider]  apixaban  (ELIQUIS ) 5 MG TABS tablet Take 1 tablet (5 mg total) by mouth 2 (two) times daily. 06/27/21  Yes Darus Engels A, DO  aspirin  81 MG chewable tablet Chew 81 mg by mouth daily.   Yes [provider]  atorvastatin  (LIPITOR ) 80 MG tablet Take 80 mg by mouth at bedtime.   Yes [provider]  docusate sodium  (COLACE) 100 MG capsule Take 100 mg by mouth daily.   Yes [provider]  glucagon (GLUCAGEN) 1 MG SOLR injection Inject 1 mg into the vein daily as needed for low blood sugar.   Yes [provider]  guaiFENesin  (ROBITUSSIN) 100 MG/5ML  liquid Take 15 mLs by mouth every 4 (four) hours as needed for cough or to loosen phlegm.   Yes [provider]  hydrALAZINE  (APRESOLINE ) 25 MG tablet Take 25 mg by mouth in the morning and at bedtime.   Yes [provider]  levothyroxine (SYNTHROID) 50 MCG tablet Take 50 mcg by mouth daily before breakfast.   Yes [provider]  lisinopril  (ZESTRIL ) 20 MG tablet Take 20 mg by mouth See admin instructions. Take 20 mg by mouth once daily on Monday, Wednesday, Friday, and Sunday.   Yes [provider]  lisinopril  (ZESTRIL ) 40 MG tablet Take 40 mg by mouth at bedtime.   Yes [provider]  metoprolol  succinate (TOPROL -XL) 25 MG 24 hr tablet Take 1 tablet (25 mg total) by mouth daily. 06/28/21  Yes Montey Apa, DO  Multiple Vitamins-Minerals (MULTIVITAMIN WITH MINERALS) tablet Take 1 tablet by mouth daily in the afternoon.   Yes [provider]  sucroferric oxyhydroxide (VELPHORO ) 500 MG chewable tablet Chew 1,000 mg by mouth  3 (three) times daily with meals.   Yes [provider]     Family History  Problem Relation Age of Onset   Diabetes Mother    Heart disease Mother    Diabetes Brother     Social History   Socioeconomic History   Marital status: Legally Separated    Spouse name: Not on file   Number of children: 1   Years of education: Not on file   Highest education level: Not on file  Occupational History    Employer: DISABLED   Tobacco Use   Smoking status: Former    Current packs/day: 0.00    Types: Cigarettes    Quit date: 06/06/1979    Years since quitting: 44.8   Smokeless tobacco: Former  Building services engineer status: Never Used  Substance and Sexual Activity   Alcohol use: No    Alcohol/week: 0.0 standard drinks of alcohol    Comment:   "years ago" , none now   Drug use: No   Sexual activity: Never  Other Topics Concern   Not on file  Social History Narrative    Lives in  group home.  Only son is  deceased.    Social Drivers of Corporate investment banker Strain: Not on file  Food Insecurity: No Food Insecurity (04/05/2024)   Hunger Vital Sign    Worried About Running Out of Food in the Last Year: Never true    Ran Out of Food in the Last Year: Never true  Transportation Needs: No Transportation Needs (04/05/2024)   PRAPARE - Administrator, Civil Service (Medical): No    Lack of Transportation (Non-Medical): No  Physical Activity: Not on file  Stress: Not on file  Social Connections: Moderately Isolated (04/05/2024)   Social Connection and Isolation Panel [NHANES]    Frequency of Communication with Friends and Family: Once a week    Frequency of Social Gatherings with Friends and Family: Once a week    Attends Religious Services: 1 to 4 times per year    Active Member of [employer] or Organizations: No    Attends Engineer, structural: 1 to 4 times per year    Marital Status: Divorced     Review of Systems: A 12 point ROS discussed and pertinent positives are indicated in the HPI above.  All other systems are negative.  Vital Signs: BP (!) 142/61 (BP Location: Right Arm)   Pulse (!) 56   Temp 98.3 F (36.8 C) (Oral)   Resp 18   Ht 5\' 10"  (1.778 m)   Wt 155 lb 3.3 oz (70.4 kg)   SpO2 95%   BMI 22.27 kg/m   Advance Care Plan: The advanced care place/surrogate decision maker was discussed at the time of visit and the patient did not wish to discuss or was not able to name a surrogate decision maker or provide an advance care plan.  Physical Exam Vitals reviewed.  Constitutional:      General: He is not in acute distress.    Appearance: Normal appearance.  HENT:     Mouth/Throat:     Mouth: Mucous membranes are moist.  Cardiovascular:     Rate and Rhythm: Normal rate and regular rhythm.     Pulses: Normal pulses.     Heart sounds: No murmur heard. Pulmonary:     Effort: Pulmonary effort is normal. No respiratory distress.     Breath sounds:  Normal breath sounds.  Abdominal:     General: Abdomen is flat.     Tenderness: There is no abdominal tenderness.  Musculoskeletal:        General: Normal range of motion.  Skin:    General: Skin is warm and dry.  Neurological:     Mental Status: He is alert and oriented to person, place, and time.  Psychiatric:        Mood and Affect: Mood normal.        Behavior: Behavior normal.        Thought Content: Thought content normal.        Judgment: Judgment normal.     Imaging: DG CHEST PORT 1 VIEW Result Date: 04/08/2024 CLINICAL DATA:  Pleural effusion.  Follow-. EXAM: PORTABLE CHEST 1 VIEW COMPARISON:  Radiographs 04/07/2024 and 04/05/2024.  CT 04/06/2024. FINDINGS: 0749 hours. The heart size and mediastinal contours are stable with aortic atherosclerosis. Unchanged residual right pleural effusion with associated right basilar airspace disease. Unchanged calcification over the left hemidiaphragm. The left lung appears clear. No evidence of pneumothorax. The bones appear unchanged. IMPRESSION: Unchanged residual right pleural effusion with associated right basilar airspace disease. No evidence of pneumothorax. Electronically Signed   By: Elmon Hagedorn M.D.   On: 04/08/2024 11:40   US  THORACENTESIS ASP PLEURAL SPACE W/IMG GUIDE Result Date: 04/07/2024 INDICATION: [id number] Pleural effusion 1132 73 year old male with recurrent pleural effusion after fall on Eliquis . IR was requested for therapeutic thoracentesis. EXAM: ULTRASOUND GUIDED THERAPEUTIC THORACENTESIS MEDICATIONS: 7 cc of 1% lidocaine  COMPLICATIONS: None immediate. PROCEDURE: An ultrasound guided thoracentesis was thoroughly discussed with the patient and questions answered. The benefits, risks, alternatives and complications were also discussed. The patient understands and wishes to proceed with the procedure. Written consent was obtained. Ultrasound was performed to localize and mark an adequate pocket of fluid in the right chest.  The area was then prepped and draped in the normal sterile fashion. 1% Lidocaine  was used for local anesthesia. Under ultrasound guidance a 6 Fr Safe-T-Centesis catheter was introduced. Thoracentesis was performed. The catheter was removed and a dressing applied. FINDINGS: A total of approximately 625 mL of serosanguineous pleural fluid was removed. IMPRESSION: Successful ultrasound guided RIGHT thoracentesis yielding 625 mL of pleural fluid. Procedure performed by Lambert Pillion, PA-C Electronically Signed   By: Art Largo M.D.   On: 04/07/2024 16:41   DG Chest 1 View Result Date: 04/07/2024 CLINICAL DATA:  Pleural effusion.  The EXAM: CHEST  1 VIEW COMPARISON:  04/05/2024. FINDINGS: Prominent cardiac silhouette. Right base consolidation and moderate right-sided pleural effusion. Findings have improved compared prior study. Normal pulmonary vasculature. Minimal left base subsegmental atelectasis. Calcified plaque left base. IMPRESSION: Right base consolidation and effusion with improvement. Electronically Signed   By: Sydell Eva M.D.   On: 04/07/2024 14:35   ECHOCARDIOGRAM COMPLETE Result Date: 04/06/2024    ECHOCARDIOGRAM REPORT   Patient Name:   MAVRYK KICE Date of Exam: 04/06/2024 Medical Rec #:  [id number]       Height:       70.0 in Accession #:    [id number]      Weight:       171.5 lb Date of Birth:  01/20/1951       BSA:          1.955 m Patient Age:    73 years        BP:           139/62 mmHg Patient Gender: M  HR:           63 bpm. Exam Location:  Inpatient Procedure: 2D Echo, Cardiac Doppler and Color Doppler (Both Spectral and Color            Flow Doppler were utilized during procedure). Indications:    I50.31 Acute diastolic (congestive) heart failure  History:        Patient has prior history of Echocardiogram examinations, most                 recent 06/16/2021. CHF, Chronic Kidney Disease; Risk                 Factors:Diabetes and Hypertension.  Sonographer:    Andrena Bang  Referring Phys: SAAD NOORUDDIN IMPRESSIONS  1. Left ventricular ejection fraction, by estimation, is 60 to 65%. The left ventricle has normal function. The left ventricle has no regional wall motion abnormalities. Left ventricular diastolic parameters were normal.  2. Right ventricular systolic function is normal. The right ventricular size is normal. There is severely elevated pulmonary artery systolic pressure. The estimated right ventricular systolic pressure is 88.7 mmHg.  3. The mitral valve is normal in structure. Mild mitral valve regurgitation. No evidence of mitral stenosis.  4. Tricuspid valve regurgitation is moderate.  5. The aortic valve has an indeterminant number of cusps. There is moderate calcification of the aortic valve. There is moderate thickening of the aortic valve. Aortic valve regurgitation is not visualized. Aortic valve sclerosis is present, with no evidence of aortic valve stenosis. Aortic valve mean gradient measures 8.0 mmHg. Aortic valve Vmax measures 1.98 m/s.  6. The inferior vena cava is normal in size with greater than 50% respiratory variability, suggesting right atrial pressure of 3 mmHg. FINDINGS  Left Ventricle: Left ventricular ejection fraction, by estimation, is 60 to 65%. The left ventricle has normal function. The left ventricle has no regional wall motion abnormalities. The left ventricular internal cavity size was normal in size. There is  no left ventricular hypertrophy. Left ventricular diastolic parameters were normal. Right Ventricle: The right ventricular size is normal. No increase in right ventricular wall thickness. Right ventricular systolic function is normal. There is severely elevated pulmonary artery systolic pressure. The tricuspid regurgitant velocity is 4.63 m/s, and with an assumed right atrial pressure of 3 mmHg, the estimated right ventricular systolic pressure is 88.7 mmHg. Left Atrium: Left atrial size was normal in size. Right Atrium: Right atrial  size was normal in size. Pericardium: There is no evidence of pericardial effusion. Mitral Valve: The mitral valve is normal in structure. Mild mitral valve regurgitation. No evidence of mitral valve stenosis. Tricuspid Valve: The tricuspid valve is normal in structure. Tricuspid valve regurgitation is moderate . No evidence of tricuspid stenosis. Aortic Valve: The aortic valve has an indeterminant number of cusps. There is moderate calcification of the aortic valve. There is moderate thickening of the aortic valve. Aortic valve regurgitation is not visualized. Aortic valve sclerosis is present, with no evidence of aortic valve stenosis. Aortic valve mean gradient measures 8.0 mmHg. Aortic valve peak gradient measures 15.7 mmHg. Aortic valve area, by VTI measures 1.55 cm. Pulmonic Valve: The pulmonic valve was normal in structure. Pulmonic valve regurgitation is not visualized. No evidence of pulmonic stenosis. Aorta: The aortic root is normal in size and structure. Venous: The inferior vena cava is normal in size with greater than 50% respiratory variability, suggesting right atrial pressure of 3 mmHg. IAS/Shunts: No atrial level shunt detected by color flow Doppler.  LEFT VENTRICLE PLAX 2D LVIDd:         4.50 cm      Diastology LVIDs:         2.80 cm      LV e' medial:    4.35 cm/s LV PW:         1.50 cm      LV E/e' medial:  29.9 LV IVS:        0.90 cm      LV e' lateral:   10.10 cm/s LVOT diam:     1.90 cm      LV E/e' lateral: 12.9 LV SV:         67 LV SV Index:   34 LVOT Area:     2.84 cm  LV Volumes (MOD) LV vol d, MOD A2C: 121.0 ml LV vol d, MOD A4C: 115.0 ml LV vol s, MOD A2C: 46.6 ml LV vol s, MOD A4C: 49.2 ml LV SV MOD A2C:     74.4 ml LV SV MOD A4C:     115.0 ml LV SV MOD BP:      72.8 ml RIGHT VENTRICLE RV S prime:     9.90 cm/s TAPSE (M-mode): 2.1 cm LEFT ATRIUM             Index LA diam:        3.50 cm 1.79 cm/m LA Vol (A2C):   83.9 ml 42.91 ml/m LA Vol (A4C):   37.6 ml 19.23 ml/m LA Biplane Vol:  55.6 ml 28.43 ml/m  AORTIC VALVE AV Area (Vmax):    1.50 cm AV Area (Vmean):   1.41 cm AV Area (VTI):     1.55 cm AV Vmax:           198.00 cm/s AV Vmean:          134.000 cm/s AV VTI:            0.434 m AV Peak Grad:      15.7 mmHg AV Mean Grad:      8.0 mmHg LVOT Vmax:         105.00 cm/s LVOT Vmean:        66.800 cm/s LVOT VTI:          0.237 m LVOT/AV VTI ratio: 0.55  AORTA Ao Asc diam: 3.20 cm MITRAL VALVE                TRICUSPID VALVE MV Area (PHT): 4.44 cm     TR Peak grad:   85.7 mmHg MV Decel Time: 171 msec     TR Vmax:        463.00 cm/s MV E velocity: 130.00 cm/s MV A velocity: 92.00 cm/s   SHUNTS MV E/A ratio:  1.41         Systemic VTI:  0.24 m                             Systemic Diam: 1.90 cm Dorothye Gathers MD Electronically signed by Dorothye Gathers MD Signature Date/Time: 04/06/2024/1:17:04 PM    Final    CT CHEST W CONTRAST Result Date: 04/06/2024 CLINICAL DATA:  Pleural effusion.  Malignancy suspected. EXAM: CT CHEST WITH CONTRAST TECHNIQUE: Multidetector CT imaging of the chest was performed during intravenous contrast administration. RADIATION DOSE REDUCTION: This exam was performed according to the departmental dose-optimization program which includes automated exposure control, adjustment of the mA and/or kV according to patient size and/or  use of iterative reconstruction technique. CONTRAST:  75mL OMNIPAQUE  IOHEXOL  350 MG/ML SOLN COMPARISON:  Plain film of 1 day prior.  CTA chest 07/07/2021 FINDINGS: Degradation secondary to minimal motion and patient arm position, not raised above the head. There is also EKG wire and lead artifact. Cardiovascular: Aortic atherosclerosis. Mild cardiomegaly, without pericardial effusion. Three vessel coronary artery calcification. No central pulmonary embolism, on this non-dedicated study. Mediastinum/Nodes: Periesophageal node at the level of the diaphragmatic hiatus measures 11 mm on 121/3 and is new. No hilar adenopathy. Lungs/Pleura: Small to moderate  right-sided pleural effusion. Trace pleural air including on 105/3 is likely iatrogenic in the setting of recent thoracentesis. Dependent right lower lobe collapse/consolidation including on 105/4. Subpleural right middle masslike opacity including at 4.8 x 3.5 cm on 88/4. Trace fluid in the left major fissure. Calcified left diaphragmatic pleural plaque including on coronal image 66. Upper Abdomen: Normal imaged portions of the liver, spleen, stomach, left adrenal gland. Musculoskeletal: Advanced thoracic spondylosis. IMPRESSION: 1. Mild to moderate multifactorial degradation as detailed above. 2. Small to moderate right pleural effusion. Trace right pleural air is likely iatrogenic. 3. Right lower lobe collapse/consolidation is most likely related to atelectasis versus less likely pneumonia. A right middle lobe subpleural masslike soft tissue density is indeterminate. This may simply represent atelectasis. A neoplasm cannot be entirely excluded. Potential clinical strategies include repeat thoracentesis with repeat CT after pleural fluid drainage versus a more aggressive approach with PET and/or bronchoscopy. 4. trace left pleural fluid with left pleural calcifications suggesting remote empyema or hemothorax. 5. Isolated nonspecific lower mediastinal/periesophageal adenopathy. 6. Incidental findings, including: Coronary artery atherosclerosis. Aortic Atherosclerosis (ICD10-I70.0). Electronically Signed   By: Lore Rode M.D.   On: 04/06/2024 09:27   IR THORACENTESIS ASP PLEURAL SPACE W/IMG GUIDE Result Date: 04/05/2024 INDICATION: 73 year old male presented to ED after a fall, with new pleural effusion noted. IR was requested for diagnostic and therapeutic thoracentesis. EXAM: ULTRASOUND GUIDED DIAGNOSTIC AND THERAPEUTIC THORACENTESIS MEDICATIONS: 6 cc of 1% lidocaine  COMPLICATIONS: None immediate. PROCEDURE: An ultrasound guided thoracentesis was thoroughly discussed with the patient and questions answered.  The benefits, risks, alternatives and complications were also discussed. The patient understands and wishes to proceed with the procedure. Written consent was obtained. Ultrasound was performed to localize and mark an adequate pocket of fluid in the right chest. The area was then prepped and draped in the normal sterile fashion. 1% Lidocaine  was used for local anesthesia. Under ultrasound guidance a 6 Fr Safe-T-Centesis catheter was introduced. Thoracentesis was performed. The catheter was removed and a dressing applied. FINDINGS: A total of approximately 625 cc of opaque, blood laden pleural fluid was removed. Samples were sent to the laboratory as requested by the clinical team. IMPRESSION: Successful ultrasound guided right thoracentesis yielding 625 cc of pleural fluid. Procedure performed by Lambert Pillion, PA-C Electronically Signed   By: Melven Stable.  Shick M.D.   On: 04/05/2024 16:03   DG Chest 1 View Result Date: 04/05/2024 CLINICAL DATA:  Status post thoracentesis.  Fall. EXAM: CHEST  1 VIEW COMPARISON:  Chest radiograph dated 04/05/2024. FINDINGS: Similar or slightly decreased right pleural effusion with right lung base atelectasis or infiltrate. No pneumothorax. Stable cardiac silhouette. No acute osseous pathology. IMPRESSION: Similar or slightly decreased right pleural effusion. No pneumothorax. Electronically Signed   By: Angus Bark M.D.   On: 04/05/2024 15:14   CT Head Wo Contrast Result Date: 04/05/2024 CLINICAL DATA:  Minor head trauma, fall on blood thinner EXAM: CT HEAD WITHOUT CONTRAST CT CERVICAL  SPINE WITHOUT CONTRAST TECHNIQUE: Multidetector CT imaging of the head and cervical spine was performed following the standard protocol without intravenous contrast. Multiplanar CT image reconstructions of the cervical spine were also generated. RADIATION DOSE REDUCTION: This exam was performed according to the departmental dose-optimization program which includes automated exposure control,  adjustment of the mA and/or kV according to patient size and/or use of iterative reconstruction technique. COMPARISON:  09/20/2023 FINDINGS: CT HEAD FINDINGS Brain: No evidence of acute infarction, hemorrhage, hydrocephalus, extra-axial collection or mass lesion/mass effect. Low-density in the cerebral white matter attributed to chronic small vessel ischemia. Chronic lacunar infarct at the genu of the left internal capsule. Vascular: No hyperdense vessel or unexpected calcification. Skull: Normal. Negative for fracture or focal lesion. Sinuses/Orbits: No evidence of injury CT CERVICAL SPINE FINDINGS Alignment: Straightening of cervical lordosis. Skull base and vertebrae: No acute fracture. No primary bone lesion or focal pathologic process. Soft tissues and spinal canal: No prevertebral fluid or swelling. No visible canal hematoma. Disc levels: Generalized degenerative disc narrowing and endplate degeneration. Generalized degenerative facet spurring. Multilevel foraminal impingement. Upper chest: Costovertebral ankylosis at upper thoracic levels. No acute finding IMPRESSION: No evidence of acute intracranial or cervical spine injury. Electronically Signed   By: Ronnette Coke M.D.   On: 04/05/2024 10:02   CT Cervical Spine Wo Contrast Result Date: 04/05/2024 CLINICAL DATA:  Minor head trauma, fall on blood thinner EXAM: CT HEAD WITHOUT CONTRAST CT CERVICAL SPINE WITHOUT CONTRAST TECHNIQUE: Multidetector CT imaging of the head and cervical spine was performed following the standard protocol without intravenous contrast. Multiplanar CT image reconstructions of the cervical spine were also generated. RADIATION DOSE REDUCTION: This exam was performed according to the departmental dose-optimization program which includes automated exposure control, adjustment of the mA and/or kV according to patient size and/or use of iterative reconstruction technique. COMPARISON:  09/20/2023 FINDINGS: CT HEAD FINDINGS Brain: No  evidence of acute infarction, hemorrhage, hydrocephalus, extra-axial collection or mass lesion/mass effect. Low-density in the cerebral white matter attributed to chronic small vessel ischemia. Chronic lacunar infarct at the genu of the left internal capsule. Vascular: No hyperdense vessel or unexpected calcification. Skull: Normal. Negative for fracture or focal lesion. Sinuses/Orbits: No evidence of injury CT CERVICAL SPINE FINDINGS Alignment: Straightening of cervical lordosis. Skull base and vertebrae: No acute fracture. No primary bone lesion or focal pathologic process. Soft tissues and spinal canal: No prevertebral fluid or swelling. No visible canal hematoma. Disc levels: Generalized degenerative disc narrowing and endplate degeneration. Generalized degenerative facet spurring. Multilevel foraminal impingement. Upper chest: Costovertebral ankylosis at upper thoracic levels. No acute finding IMPRESSION: No evidence of acute intracranial or cervical spine injury. Electronically Signed   By: Ronnette Coke M.D.   On: 04/05/2024 10:02   DG Pelvis 1-2 Views Result Date: 04/05/2024 CLINICAL DATA:  Fall. EXAM: PELVIS - 1-2 VIEW COMPARISON:  June 14, 2021. FINDINGS: There is no evidence of pelvic fracture or diastasis. No pelvic bone lesions are seen. IMPRESSION: Negative. Electronically Signed   By: Rosalene Colon M.D.   On: 04/05/2024 09:06   DG Chest Portable 1 View Result Date: 04/05/2024 CLINICAL DATA:  Fall. EXAM: PORTABLE CHEST 1 VIEW COMPARISON:  January 19, 2023. FINDINGS: Stable cardiomediastinal silhouette. Left lung is clear. Mild to moderate right pleural effusion is noted with associated atelectasis. Bony thorax is unremarkable. IMPRESSION: Mild to moderate right pleural effusion is noted with associated right basilar atelectasis. Electronically Signed   By: Rosalene Colon M.D.   On: 04/05/2024  09:04    Labs:  CBC: Recent Labs    09/20/23 1508 02/02/24 1009 04/05/24 0849  04/06/24 0603 04/08/24 0457  WBC 9.6  --  5.8 5.4 7.0  HGB 10.6* 10.2* 12.2* 10.8* 11.7*  HCT 33.2* 30.0* 40.2 34.6* 37.0*  PLT 195  --  146* 118* 126*    COAGS: No results for input(s): "INR", "APTT" in the last 8760 hours.  BMP: Recent Labs    04/05/24 0849 04/05/24 1753 04/06/24 0603 04/08/24 0457  NA 136 138 137 133*  K 3.5 3.3* 3.4* 4.2  CL 96* 98 99 97*  CO2 29 30 30 24   GLUCOSE 73 79 86 79  BUN 20 23 29* 33*  CALCIUM  8.8* 9.2 9.2 9.1  CREATININE 5.48* 6.37* 7.13* 7.55*  GFRNONAA 10* 9* 7* 7*    LIVER FUNCTION TESTS: Recent Labs    09/20/23 1508 04/05/24 1753 04/08/24 0457  BILITOT 0.7 0.5  --   AST 16 10*  --   ALT 18 8  --   ALKPHOS 55 35*  --   PROT 7.9 6.2*  --   ALBUMIN 3.2* 2.9* 2.7*    TUMOR MARKERS: No results for input(s): "AFPTM", "CEA", "CA199", "CHROMGRNA" in the last 8760 hours.  Assessment and Plan: Patient presented to ED on 4/25 after falling out of bed. Patient was on Eliquis . CXR was notable for incidental right pleural effusion. Patient underwent thoracentesis on 4/25, and again on 4/27. Recovered pleural fluid was blood-laden. Patient was recommended for chest tube placement. Patient is amenable.  All labs and medications are within acceptable parameters.  Elequis was held 4/26.  No pertinent allergies.  Patient will be NPO at midnight.   Patient presents for tentatively scheduled chest tube placement in IR tomorrow.  Risks and benefits discussed with the patient including bleeding, infection, partial or total pneumothorax, and damage to adjacent structures.  All of the patient's questions were answered, patient is agreeable to proceed.  Consent signed and in IR box.     Thank you for allowing our service to participate in Freda Jacobson 's care.  Electronically Signed: Lovena Rubinstein, PA-C   04/08/2024, 1:53 PM      I spent a total of 40 Minutes in face to face in clinical consultation, greater than 50% of which  was counseling/coordinating care for recurrent pleural effusion, with consideration for chest tube placement.

## 2024-04-08 NOTE — Progress Notes (Signed)
   NAME:  [name], MRN:  [id number], DOB:  07-22-1951, LOS: 2 ADMISSION DATE:  04/05/2024, CONSULTATION DATE:  04/06/2024 REFERRING MD:  Kirt Pereyra MD, CHIEF COMPLAINT:   Pleural effusion, abnormal CT  History of Present Illness:   73 year old with history of end-stage renal disease on hemodialysis, hypothyroidism, hypertension, atrial fibrillation on Eliquis  Presenting after fall out of bed yesterday morning.  He fell on the right side and noted to have right-sided pleural effusion Underwent thoracentesis by IR yesterday with findings of bloody exudative effusion with lymphocyte predominance Follow-up CT shows possible lung mass and persistent moderate effusion and PCCM consulted for help with management.  He is a ex-smoker but quit at the age of 36.  Used to work in transportation.  May have had some exposure to asbestos in the past but cannot recall clearly Ongoing exposures.  Pertinent  Medical History    has a past medical history of Arthritis, Asthma, Chronic cystitis, Chronic diastolic heart failure (HCC), Chronic kidney disease, Diabetes mellitus, Dysrhythmia, GERD (gastroesophageal reflux disease), H/O hiatal hernia, Hyperlipidemia, Hypertension, Lymphedema, Morbid obesity (HCC), NEPHROLITHIASIS, HX OF (12/02/2009), PVD (peripheral vascular disease) (HCC), Renal insufficiency, UMBILICAL HERNIA (02/08/2007), and Venous insufficiency.   Significant Hospital Events: Including procedures, antibiotic start and stop dates in addition to other pertinent events   4/25 admit, right thoracentesis delayed due to anticoagulation 4/26 no issues overnight, awaiting IR thoracentesis with tentative plans for IR placed smallbore chest tube tomorrow 4/27 IR guided thoracentesis  Interim History / Subjective:  Denies any current dyspnea. Had pain during thoracentesis. His biggest concern is pain with procedures.   Objective   Blood pressure (!) 142/61, pulse (!) 56, temperature 98.3 F  (36.8 C), temperature source Oral, resp. rate 18, height 5\' 10"  (1.778 m), weight 70.4 kg, SpO2 95%.        Intake/Output Summary (Last 24 hours) at 04/08/2024 1138 Last data filed at 04/08/2024 0300 Gross per 24 hour  Intake 460 ml  Output --  Net 460 ml   Filed Weights   04/06/24 2149 04/07/24 0046 04/07/24 0356  Weight: 79.4 kg 78 kg 70.4 kg    Examination: Elderly man sitting up in chair, no respiratory distress On room air Breath sounds diminished right lung base RRR LUE AVF +thrill and bruit No peripheral edema  Pleural fluid 04/05/2024 Bloody, total nucleated cells 02/18/2007, 91% lymphs AFB cultures pending Cytology pending  CT chest 04/06/2024 postinfusion this moderate right-sided effusion trace pleural air, dependent right lower lobe consolidation/collapse, right middle masslike opacity  Resolved Hospital Problem list     Assessment & Plan:  Right sided pleural effusion, exudative, lymphocyte predominant Possible right lung mass vs rounded atelectasis noted on CT Chest H/o former remote tobacco use ESRD on HD  Will add on a pleural fluid hematocrit to r/o hemothorax, although fluid was described as serosanguinous so this is much less likely. Hemothorax would be frank blood.  Pain with procedure suggests lung entrapment.  CT Chest concerning for malignancy. Ideally would drain right pleural space dry, allow for lung re-expansion and repeat CT Chest to evaluate better for malignancy.  Cytology pending from previous fluid collection.   Discussed with patient, IR and primary team.  Louie Rover, MD Pulmonary and Critical Care Medicine [facility] 04/08/2024 11:41 AM Pager: see AMION  If no response to pager, please call critical care on call (see AMION) until 7pm After 7:00 pm call Elink

## 2024-04-08 NOTE — Progress Notes (Signed)
 Subjective: Seen and examined in room sitting up in chair eating lunch, denies shortness of breath or chest pain for dialysis tomorrow schedule  Objective Vital signs in last 24 hours: Vitals:   04/07/24 1725 04/07/24 2102 04/07/24 2105 04/08/24 0700  BP: (!) 157/82 (!) 131/52 (!) 131/52 (!) 142/61  Pulse: 68  72 (!) 56  Resp:   18   Temp:   98.2 F (36.8 C) 98.3 F (36.8 C)  TempSrc:   Oral Oral  SpO2:   94% 95%  Weight:      Height:       Weight change:   Physical Exam: General: Alert pleasant but chronically ill-appearing elderly male Heart: RRR no MRG Lungs: Decreased breath sounds on right side, left clear nonlabored breathing room air Abdomen: NABS soft NT ND Extremities: woody hard appearing pedal edema minimal, with dry flaky skin Dialysis Acces left arm AV fistula positive bruit  Dialysis Orders: East, T,Th, S 4 hrs 180NRe 450/Autoflow 1.5 74.6 kg 2.0 K/ 2.0 Ca AVF - Heparin  3000 units IV initial bolus and 3000 units IV mid run - Hectorol  6 mcg IV three times per week - Sensipar  30 mg PO three times per week - Venofer  50 mg IV weekly   Problem/Plan:  R pleural effusion with possible lung Ca versus rounded atelectasis noted on CT chest status post thoracentesis by IR/27 with bloody exudative effusion lymphocyte predominance, :Seen by PCCM planning repeat CT, follow-up cytology   Hypokalemia: present on admission.  K4.2 today follow labs.  Use high K bath  Mechanical Fall-CT of head without acute injury. Per primary  ESRD -  T, Th, S Next HD 04/09/2024  Hypertension/volume  - hypotensive when seen by EMS. BP stable now. Continue home meds. He seems to be losing wt, Will attempt to lower and optimize volume with HD. UF as tolerated.   Anemia  - HGB at 11.7 goal. Follow labs.   Metabolic bone disease -corrected calcium  over 10 will hold VDRA, continue Sensipar , velphoro  binders.  Phosphorus 5.4  Nutrition - Low albumin, 2.7 low K+. Can have regular diet with fluid  restrictions, protein supplement  Charlynne Coombes, PA-C [facility] [phone] 04/08/2024,12:46 PM  LOS: 2 days   Labs: Basic Metabolic Panel: Recent Labs  Lab 04/05/24 1753 04/06/24 0603 04/08/24 0457  NA 138 137 133*  K 3.3* 3.4* 4.2  CL 98 99 97*  CO2 30 30 24   GLUCOSE 79 86 79  BUN 23 29* 33*  CREATININE 6.37* 7.13* 7.55*  CALCIUM  9.2 9.2 9.1  PHOS  --   --  5.4*   Liver Function Tests: Recent Labs  Lab 04/05/24 1753 04/08/24 0457  AST 10*  --   ALT 8  --   ALKPHOS 35*  --   BILITOT 0.5  --   PROT 6.2*  --   ALBUMIN 2.9* 2.7*   No results for input(s): "LIPASE", "AMYLASE" in the last 168 hours. No results for input(s): "AMMONIA" in the last 168 hours. CBC: Recent Labs  Lab 04/05/24 0849 04/06/24 0603 04/08/24 0457  WBC 5.8 5.4 7.0  NEUTROABS  --   --  4.9  HGB 12.2* 10.8* 11.7*  HCT 40.2 34.6* 37.0*  MCV 95.3 94.3 93.9  PLT 146* 118* 126*   Cardiac Enzymes: No results for input(s): "CKTOTAL", "CKMB", "CKMBINDEX", "TROPONINI" in the last 168 hours. CBG: Recent Labs  Lab 04/05/24 2013  GLUCAP 117*    Studies/Results: DG CHEST PORT 1 VIEW Result Date:  04/08/2024 CLINICAL DATA:  Pleural effusion.  Follow-. EXAM: PORTABLE CHEST 1 VIEW COMPARISON:  Radiographs 04/07/2024 and 04/05/2024.  CT 04/06/2024. FINDINGS: 0749 hours. The heart size and mediastinal contours are stable with aortic atherosclerosis. Unchanged residual right pleural effusion with associated right basilar airspace disease. Unchanged calcification over the left hemidiaphragm. The left lung appears clear. No evidence of pneumothorax. The bones appear unchanged. IMPRESSION: Unchanged residual right pleural effusion with associated right basilar airspace disease. No evidence of pneumothorax. Electronically Signed   By: Elmon Hagedorn M.D.   On: 04/08/2024 11:40   US  THORACENTESIS ASP PLEURAL SPACE W/IMG GUIDE Result Date: 04/07/2024 INDICATION: [id number] Pleural effusion  1680 73 year old male with recurrent pleural effusion after fall on Eliquis . IR was requested for therapeutic thoracentesis. EXAM: ULTRASOUND GUIDED THERAPEUTIC THORACENTESIS MEDICATIONS: 7 cc of 1% lidocaine  COMPLICATIONS: None immediate. PROCEDURE: An ultrasound guided thoracentesis was thoroughly discussed with the patient and questions answered. The benefits, risks, alternatives and complications were also discussed. The patient understands and wishes to proceed with the procedure. Written consent was obtained. Ultrasound was performed to localize and mark an adequate pocket of fluid in the right chest. The area was then prepped and draped in the normal sterile fashion. 1% Lidocaine  was used for local anesthesia. Under ultrasound guidance a 6 Fr Safe-T-Centesis catheter was introduced. Thoracentesis was performed. The catheter was removed and a dressing applied. FINDINGS: A total of approximately 625 mL of serosanguineous pleural fluid was removed. IMPRESSION: Successful ultrasound guided RIGHT thoracentesis yielding 625 mL of pleural fluid. Procedure performed by Lambert Pillion, PA-C Electronically Signed   By: Art Largo M.D.   On: 04/07/2024 16:41   DG Chest 1 View Result Date: 04/07/2024 CLINICAL DATA:  Pleural effusion.  The EXAM: CHEST  1 VIEW COMPARISON:  04/05/2024. FINDINGS: Prominent cardiac silhouette. Right base consolidation and moderate right-sided pleural effusion. Findings have improved compared prior study. Normal pulmonary vasculature. Minimal left base subsegmental atelectasis. Calcified plaque left base. IMPRESSION: Right base consolidation and effusion with improvement. Electronically Signed   By: Sydell Eva M.D.   On: 04/07/2024 14:35   Medications:  ampicillin-sulbactam (UNASYN) IV 3 g (04/07/24 1727)    (feeding supplement) PROSource Plus  30 mL Oral BID BM   aspirin   81 mg Oral Daily   atorvastatin   80 mg Oral QHS   Chlorhexidine  Gluconate Cloth  6 each Topical Q0600    cinacalcet   30 mg Oral Q T,Th,Sat-1800   doxercalciferol   6 mcg Intravenous Q T,Th,Sa-HD   feeding supplement (NEPRO CARB STEADY)  237 mL Oral BID BM   heparin  sodium (porcine)  2,000 Units Intra-arterial Once   hydrALAZINE   25 mg Oral TID   hydrocerin   Topical BID   levothyroxine  50 mcg Oral Q0600   lidocaine  (PF)  10 mL Intradermal Once   lisinopril   40 mg Oral QHS   multivitamin  1 tablet Oral QHS   sucroferric oxyhydroxide  1,000 mg Oral TID WC

## 2024-04-08 NOTE — Progress Notes (Addendum)
 HD#2 Subjective:  Summary: [name] is a 73 year old male with past medical history of ESRD on hemodialysis, a fib on A/C, type 2 diabetes, and hypertension, who presented from skilled nursing facility[facility] by EMS after he had a fall,  and found to have exudative pleural effusion.  Overnight Events: NOE. S/p repeat thoracentesis yesterday, repeat follow-up chest x-ray showed improvement of pleural effusion.  This morning, examined at bedside.  Has intermittent chronic chest pain, does not radiate, no nausea or vomiting. Denies SOB.  Objective:  Vital signs in last 24 hours: Vitals:   04/07/24 1725 04/07/24 2102 04/07/24 2105 04/08/24 0700  BP: (!) 157/82 (!) 131/52 (!) 131/52 (!) 142/61  Pulse: 68  72 (!) 56  Resp:   18   Temp:   98.2 F (36.8 C) 98.3 F (36.8 C)  TempSrc:   Oral Oral  SpO2:   94% 95%  Weight:      Height:       Supplemental O2: Room Air SpO2: 95 %   Physical Exam:  Lying in bed, NAD.   Heart with regular rate and rhythm, no murmurs. Clear bilateral lungs, normal air movement. Lower extremities with bilateral dermatitis and chronic venous disease/lymphedema. L AVF access with palpable thrill  Filed Weights   04/06/24 2149 04/07/24 0046 04/07/24 0356  Weight: 79.4 kg 78 kg 70.4 kg     Intake/Output Summary (Last 24 hours) at 04/08/2024 0926 Last data filed at 04/08/2024 0300 Gross per 24 hour  Intake 460 ml  Output --  Net 460 ml   Net IO Since Admission: -560 mL [04/08/24 0926]  Recent Labs    04/05/24 2013  GLUCAP 117*     Pertinent Labs:    Latest Ref Rng & Units 04/08/2024    4:57 AM 04/06/2024    6:03 AM 04/05/2024    8:49 AM  CBC  WBC 4.0 - 10.5 K/uL 7.0  5.4  5.8   Hemoglobin 13.0 - 17.0 g/dL 16.1  09.6  04.5   Hematocrit 39.0 - 52.0 % 37.0  34.6  40.2   Platelets 150 - 400 K/uL 126  118  146        Latest Ref Rng & Units 04/08/2024    4:57 AM 04/06/2024    6:03 AM 04/05/2024    5:53 PM  CMP  Glucose 70 - 99 mg/dL  79  86  79   BUN 8 - 23 mg/dL 33  29  23   Creatinine 0.61 - 1.24 mg/dL 4.09  8.11  9.14   Sodium 135 - 145 mmol/L 133  137  138   Potassium 3.5 - 5.1 mmol/L 4.2  3.4  3.3   Chloride 98 - 111 mmol/L 97  99  98   CO2 22 - 32 mmol/L 24  30  30    Calcium  8.9 - 10.3 mg/dL 9.1  9.2  9.2   Total Protein 6.5 - 8.1 g/dL   6.2   Total Bilirubin 0.0 - 1.2 mg/dL   0.5   Alkaline Phos 38 - 126 U/L   35   AST 15 - 41 U/L   10   ALT 0 - 44 U/L   8     Imaging: US  THORACENTESIS ASP PLEURAL SPACE W/IMG GUIDE Result Date: 04/07/2024 INDICATION: [id number] Pleural effusion 6588 73 year old male with recurrent pleural effusion after fall on Eliquis . IR was requested for therapeutic thoracentesis. EXAM: ULTRASOUND GUIDED THERAPEUTIC THORACENTESIS MEDICATIONS: 7 cc of 1% lidocaine  COMPLICATIONS: None immediate.  PROCEDURE: An ultrasound guided thoracentesis was thoroughly discussed with the patient and questions answered. The benefits, risks, alternatives and complications were also discussed. The patient understands and wishes to proceed with the procedure. Written consent was obtained. Ultrasound was performed to localize and mark an adequate pocket of fluid in the right chest. The area was then prepped and draped in the normal sterile fashion. 1% Lidocaine  was used for local anesthesia. Under ultrasound guidance a 6 Fr Safe-T-Centesis catheter was introduced. Thoracentesis was performed. The catheter was removed and a dressing applied. FINDINGS: A total of approximately 625 mL of serosanguineous pleural fluid was removed. IMPRESSION: Successful ultrasound guided RIGHT thoracentesis yielding 625 mL of pleural fluid. Procedure performed by Lambert Pillion, PA-C Electronically Signed   By: Art Largo M.D.   On: 04/07/2024 16:41   DG Chest 1 View Result Date: 04/07/2024 CLINICAL DATA:  Pleural effusion.  The EXAM: CHEST  1 VIEW COMPARISON:  04/05/2024. FINDINGS: Prominent cardiac silhouette. Right base consolidation and  moderate right-sided pleural effusion. Findings have improved compared prior study. Normal pulmonary vasculature. Minimal left base subsegmental atelectasis. Calcified plaque left base. IMPRESSION: Right base consolidation and effusion with improvement. Electronically Signed   By: Sydell Eva M.D.   On: 04/07/2024 14:35    Assessment/Plan:   Principal Problem:   Pleural effusion Active Problems:   Stage 5 chronic kidney disease on chronic dialysis [facility])   Fall   Patient Summary: [name] is a 73 y.o. with a pertinent PMH of A-fib on A/C, type 2 diabetes, hypertension, ESRD on hemodialysis who presented from SNF after a fall, found to have right pleural effusion, S/p thoracentesis x2.   #Lymphocytic predominant exudative pleural effusion Asymptomatic, afebrile, no leukocytosis.  PCCM following, status post repeat thoracentesis yesterday, with removal of about 600 cc of bloody laden fluid.  Follow up repeat chest x-ray, showed improvement in the right pleural effusion. Cytology of pleural fluid Cx showed chronic inflammation. No malignant cells.  AFB smear negative, No concern for TB.  Ddx for his exudative fluid includes hemothorax (after the fall) vs infection vs malignancy vs autoimmune process.  On Unasyn for presumed lung infection. Will place chesttube today, and will get repeat chest CT in a few days once fluid is drained.   Plan:  - Pulmonology and IR on board, appreciate recommendations  - Chest tube placement by IR today.  - Holding Eliquis  for now pending chest tube placement and washout  - Follow-up on RF, CCP, ANA. - Cw Unasyn.  # ESRD (Tue/Th/ Sat) # Normocytic anemia Mild hyponatremia, Hb stable.  -Monitor BMP.  Chronic medical conditions:   #Hypothyroidism -Levothyroxine 50 mcg.   #Hypertension - Lisinopril  40 mg. - Hydralazine  25 mg PRN  Diet: Renal diet IVF: PO intake VTE:  Code: Full PT/OT: Pending ID:  Anti-infectives (From admission,  onward)    Start     Dose/Rate Route Frequency Ordered Stop   04/06/24 1800  Ampicillin-Sulbactam (UNASYN) 3 g in sodium chloride  0.9 % 100 mL IVPB        3 g 200 mL/hr over 30 Minutes Intravenous Every 24 hours 04/06/24 1444         Anticipated discharge to Skilled nursing facility pending medical stabilization.  Marni Sins, MD 04/08/2024, 9:26 AM Pager: [phone] [facility]

## 2024-04-08 NOTE — Plan of Care (Signed)
  Problem: Metabolic: Goal: Ability to maintain appropriate glucose levels will improve Outcome: Progressing   Problem: Nutritional: Goal: Maintenance of adequate nutrition will improve Outcome: Progressing   Problem: Skin Integrity: Goal: Risk for impaired skin integrity will decrease Outcome: Progressing   Problem: Clinical Measurements: Goal: Ability to maintain clinical measurements within normal limits will improve Outcome: Progressing Goal: Will remain free from infection Outcome: Progressing   Problem: Activity: Goal: Risk for activity intolerance will decrease Outcome: Progressing   Problem: Pain Managment: Goal: General experience of comfort will improve and/or be controlled Outcome: Progressing

## 2024-04-08 NOTE — TOC Initial Note (Signed)
 Transition of Care [facility]) - Initial/Assessment Note    Patient Details  Name: [name] MRN: [id number] Date of Birth: Feb 22, 1951  Transition [facility]) CM/SW Contact:    Jannice Mends, LCSW Phone Number: 04/08/2024, 5:30 PM  Clinical Narrative:                 Patient admitted from [facility] under long term care. OP therapy recommended so CSW will not attempt a rehab authorization. Will keep [facility] updated on discharge date.  Expected Discharge Plan: Skilled Nursing Facility Barriers to Discharge: Continued Medical Work up   Patient Goals and CMS Choice            Expected Discharge Plan and Services In-house Referral: Clinical Social Work   Post Acute Care Choice: Skilled Nursing Facility Living arrangements for the past 2 months: Skilled Nursing Facility                                      Prior Living Arrangements/Services Living arrangements for the past 2 months: Skilled Nursing Facility Lives with:: Facility Resident Patient language and need for interpreter reviewed:: Yes Do you feel safe going back to the place where you live?: Yes      Need for Family Participation in Patient Care: Yes (Comment) Care giver support system in place?: Yes (comment)   Criminal Activity/Legal Involvement Pertinent to Current Situation/Hospitalization: No - Comment as needed  Activities of Daily Living   ADL Screening (condition at time of admission) Independently performs ADLs?: Yes (appropriate for developmental age) Is the patient deaf or have difficulty hearing?: No Does the patient have difficulty seeing, even when wearing glasses/contacts?: Yes Does the patient have difficulty concentrating, remembering, or making decisions?: No  Permission Sought/Granted         Permission granted to share info w AGENCY: [facility]        Emotional Assessment Appearance:: Appears stated age     Orientation: : Oriented to Self, Oriented to   Time, Oriented to Place, Oriented to Situation Alcohol / Substance Use: Not Applicable Psych Involvement: No (comment)  Admission diagnosis:  Pleural effusion [J90] Fall, initial encounter [W19.XXXA] Stage 5 chronic kidney disease on chronic dialysis (HCC) [N18.6, Z99.2] Patient Active Problem List   Diagnosis Date Noted   Fall 04/07/2024   Pleural effusion 04/05/2024   Onychomycosis of multiple toenails with type 2 diabetes mellitus and peripheral neuropathy (HCC) 06/19/2023   Pre-ulcerative calluses 06/19/2023   Persistent atrial fibrillation (HCC) 10/28/2021   Elevated troponin 10/28/2021   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, left leg (HCC) 06/28/2021   Sepsis due to cellulitis (HCC) 06/14/2021   Cellulitis of foot, left    Hypercalcemia 06/02/2020   Other disorders of phosphorus metabolism 01/04/2019   Venous stasis    History of endocarditis    Bradycardia    Orthostasis    Chest pain 09/10/2018   Hypovolemia associated with hemodialysis 07/11/2018   Weakness    Thrombocytopenia (HCC) 07/10/2018   Moderate protein-calorie malnutrition (HCC) 05/12/2017   Balanitis 04/25/2017   Penile swelling    Scrotal swelling    UTI (urinary tract infection) 04/13/2017   Anemia of chronic disease    Acute encephalopathy 04/12/2017   Urinary retention 04/10/2017   Gram-negative sepsis, unspecified (HCC) 03/29/2017   Bacteremia    RUQ abdominal pain    Gross  hematuria    Other pancytopenia (HCC)    Sepsis (HCC) 03/21/2017   Diarrhea 03/03/2017   Hyperkalemia 01/24/2017   Retained lens material following cataract surgery of both eyes 11/08/2016   Venous stasis ulcers of both lower extremities (HCC) 08/07/2015   Varicose veins of lower extremities with complications 08/07/2015   Malfunction of arteriovenous dialysis fistula (HCC)    CHF (congestive heart failure) (HCC) 06/23/2015   Headache, unspecified 06/23/2015   Iron  deficiency anemia, unspecified 06/23/2015   Other specified coagulation defects (HCC) 06/23/2015   Pain, unspecified 06/23/2015   Pruritus, unspecified 06/23/2015   Secondary hyperparathyroidism of renal origin (HCC) 06/23/2015   Type 2 diabetes mellitus with diabetic peripheral angiopathy without gangrene (HCC) 06/23/2015   Lymphedema distichiasis syndrome with kidney disease and diabetes mellitus (HCC) 04/06/2015   Lymphedema of lower extremity 04/04/2015   Type II diabetes mellitus with nephropathy (HCC) 04/04/2015   MGUS (monoclonal gammopathy of unknown significance) 06/12/2014   Chronic diastolic heart failure (HCC) 10/30/2013   Special screening for malignant neoplasms, colon 07/09/2013   Proliferative diabetic retinopathy (HCC) 10/23/2012   Knee pain 08/21/2012   Mass of thigh 08/21/2012   FEVER UNSPECIFIED 05/06/2010   ORGANIC IMPOTENCE 12/02/2009   ONYCHOMYCOSIS 07/17/2009   Lymphedema 05/06/2009   Stage 5 chronic kidney disease on chronic dialysis (HCC) 06/09/2008   Hyperlipidemia 03/19/2007   Class 2 severe obesity due to excess calories with serious comorbidity and body mass index (BMI) of 35.0 to 35.9 in adult [facility]) 02/08/2007   Essential hypertension 02/08/2007   Atherosclerosis of native arteries of extremity with intermittent claudication (HCC) 02/08/2007   PCP:  Shannan Dart., [name] Pharmacy:  No Pharmacies Listed    Social Drivers of Health (SDOH) Social History: SDOH Screenings   Food Insecurity: No Food Insecurity (04/05/2024)  Housing: Low Risk  (04/05/2024)  Transportation Needs: No Transportation Needs (04/05/2024)  Utilities: Not At Risk (04/05/2024)  Social Connections: Moderately Isolated (04/05/2024)  Tobacco Use: Medium Risk (04/05/2024)   SDOH Interventions:     Readmission Risk Interventions     No data to display

## 2024-04-09 ENCOUNTER — Inpatient Hospital Stay (HOSPITAL_COMMUNITY)

## 2024-04-09 DIAGNOSIS — Z992 Dependence on renal dialysis: Secondary | ICD-10-CM | POA: Diagnosis not present

## 2024-04-09 DIAGNOSIS — J90 Pleural effusion, not elsewhere classified: Secondary | ICD-10-CM | POA: Diagnosis not present

## 2024-04-09 DIAGNOSIS — N186 End stage renal disease: Secondary | ICD-10-CM | POA: Diagnosis not present

## 2024-04-09 LAB — CBC
HCT: 36.9 % — ABNORMAL LOW (ref 39.0–52.0)
Hemoglobin: 11.6 g/dL — ABNORMAL LOW (ref 13.0–17.0)
MCH: 29.3 pg (ref 26.0–34.0)
MCHC: 31.4 g/dL (ref 30.0–36.0)
MCV: 93.2 fL (ref 80.0–100.0)
Platelets: 148 10*3/uL — ABNORMAL LOW (ref 150–400)
RBC: 3.96 MIL/uL — ABNORMAL LOW (ref 4.22–5.81)
RDW: 16.4 % — ABNORMAL HIGH (ref 11.5–15.5)
WBC: 7.3 10*3/uL (ref 4.0–10.5)
nRBC: 0 % (ref 0.0–0.2)

## 2024-04-09 LAB — RENAL FUNCTION PANEL
Albumin: 2.8 g/dL — ABNORMAL LOW (ref 3.5–5.0)
Anion gap: 12 (ref 5–15)
BUN: 48 mg/dL — ABNORMAL HIGH (ref 8–23)
CO2: 27 mmol/L (ref 22–32)
Calcium: 9.5 mg/dL (ref 8.9–10.3)
Chloride: 96 mmol/L — ABNORMAL LOW (ref 98–111)
Creatinine, Ser: 9.19 mg/dL — ABNORMAL HIGH (ref 0.61–1.24)
GFR, Estimated: 5 mL/min — ABNORMAL LOW (ref >60)
Glucose, Bld: 73 mg/dL (ref 70–99)
Phosphorus: 5.4 mg/dL — ABNORMAL HIGH (ref 2.5–4.6)
Potassium: 4.3 mmol/L (ref 3.5–5.1)
Sodium: 135 mmol/L (ref 135–145)

## 2024-04-09 LAB — CD19 AND CD20, FLOW CYTOMETRY

## 2024-04-09 LAB — ANA W/REFLEX IF POSITIVE: Anti Nuclear Antibody (ANA): NEGATIVE

## 2024-04-09 LAB — RHEUMATOID FACTOR: Rheumatoid fact SerPl-aCnc: 10.1 [IU]/mL (ref <14.0)

## 2024-04-09 MED ORDER — FENTANYL CITRATE (PF) 100 MCG/2ML IJ SOLN
INTRAMUSCULAR | Status: AC | PRN
  Administered 2024-04-09: 25 ug via INTRAVENOUS

## 2024-04-09 MED ORDER — APIXABAN 5 MG PO TABS
5.0000 mg | ORAL_TABLET | Freq: Two times a day (BID) | ORAL | Status: DC
  Administered 2024-04-10 – 2024-04-18 (×12): 5 mg via ORAL
  Filled 2024-04-09 (×15): qty 1

## 2024-04-09 MED ORDER — MIDAZOLAM HCL 2 MG/2ML IJ SOLN
INTRAMUSCULAR | Status: AC | PRN
  Administered 2024-04-09: .5 mg via INTRAVENOUS

## 2024-04-09 MED ORDER — DOXERCALCIFEROL 4 MCG/2ML IV SOLN
INTRAVENOUS | Status: AC
Start: 2024-04-09 — End: ?
  Filled 2024-04-09: qty 4

## 2024-04-09 MED ORDER — LIDOCAINE HCL 1 % IJ SOLN
INTRAMUSCULAR | Status: AC
  Filled 2024-04-09: qty 20

## 2024-04-09 MED ORDER — ANTICOAGULANT SODIUM CITRATE 4% (200MG/5ML) IV SOLN: 5.0000 mL | Status: DC | PRN

## 2024-04-09 MED ORDER — ONDANSETRON HCL 4 MG/2ML IJ SOLN
INTRAMUSCULAR | Status: AC
  Filled 2024-04-09: qty 2

## 2024-04-09 MED ORDER — PENTAFLUOROPROP-TETRAFLUOROETH EX AERO: 1.0000 | INHALATION_SPRAY | CUTANEOUS | Status: DC | PRN

## 2024-04-09 MED ORDER — ONDANSETRON HCL 4 MG/2ML IJ SOLN
INTRAMUSCULAR | Status: AC | PRN
  Administered 2024-04-09: 4 mg via INTRAVENOUS

## 2024-04-09 MED ORDER — LIDOCAINE-PRILOCAINE 2.5-2.5 % EX CREA: 1.0000 | TOPICAL_CREAM | CUTANEOUS | Status: DC | PRN

## 2024-04-09 MED ORDER — MIDAZOLAM HCL 2 MG/2ML IJ SOLN
INTRAMUSCULAR | Status: AC
  Filled 2024-04-09: qty 2

## 2024-04-09 MED ORDER — FENTANYL CITRATE (PF) 100 MCG/2ML IJ SOLN
INTRAMUSCULAR | Status: AC
  Filled 2024-04-09: qty 2

## 2024-04-09 MED ORDER — ALTEPLASE 2 MG IJ SOLR: 2.0000 mg | Freq: Once | INTRAMUSCULAR | Status: DC | PRN

## 2024-04-09 MED ORDER — LIDOCAINE HCL (PF) 1 % IJ SOLN: 5.0000 mL | INTRAMUSCULAR | Status: DC | PRN

## 2024-04-09 NOTE — Plan of Care (Signed)
   Problem: Education: Goal: Knowledge of General Education information will improve Description: Including pain rating scale, medication(s)/side effects and non-pharmacologic comfort measures Outcome: Progressing   Problem: Health Behavior/Discharge Planning: Goal: Ability to manage health-related needs will improve Outcome: Progressing   Problem: Clinical Measurements: Goal: Ability to maintain clinical measurements within normal limits will improve Outcome: Progressing Goal: Will remain free from infection Outcome: Progressing Goal: Diagnostic test results will improve Outcome: Progressing Goal: Respiratory complications will improve Outcome: Progressing Goal: Cardiovascular complication will be avoided Outcome: Progressing   Problem: Activity: Goal: Risk for activity intolerance will decrease Outcome: Progressing   Problem: Nutrition: Goal: Adequate nutrition will be maintained Outcome: Progressing   Problem: Coping: Goal: Level of anxiety will decrease Outcome: Progressing   Problem: Elimination: Goal: Will not experience complications related to bowel motility Outcome: Progressing Goal: Will not experience complications related to urinary retention Outcome: Progressing   Problem: Pain Managment: Goal: General experience of comfort will improve and/or be controlled Outcome: Progressing   Problem: Safety: Goal: Ability to remain free from injury will improve Outcome: Progressing   Problem: Skin Integrity: Goal: Risk for impaired skin integrity will decrease Outcome: Progressing   Problem: Education: Goal: Knowledge of disease and its progression will improve Outcome: Progressing   Problem: Fluid Volume: Goal: Compliance with measures to maintain balanced fluid volume will improve Outcome: Progressing   Problem: Health Behavior/Discharge Planning: Goal: Ability to manage health-related needs will improve Outcome: Progressing   Problem:  Nutritional: Goal: Ability to make healthy dietary choices will improve Outcome: Progressing   Problem: Clinical Measurements: Goal: Complications related to the disease process, condition or treatment will be avoided or minimized Outcome: Progressing

## 2024-04-09 NOTE — Progress Notes (Addendum)
   04/09/24 1737  Mobility  Activity Stood at bedside  Level of Assistance Maximum assist, patient does 25-49%  Assistive Device Other (Comment) (HHA)  Range of Motion/Exercises Right leg;Left leg;Active  Activity Response Tolerated fair  Mobility Referral Yes  Mobility visit 1 Mobility  Mobility Specialist Start Time (ACUTE ONLY) 1737  Mobility Specialist Stop Time (ACUTE ONLY) 1812  Mobility Specialist Time Calculation (min) (ACUTE ONLY) 35 min   Mobility Specialist: Progress Note  Pre-Mobility:      SpO2  80-87% RA During Mobility: SpO2   89-97% 4L Post-Mobility:    SpO2  100% 4L  Pt agreeable to mobility session - received in bed on RA. C/o sob with movement, pain at chest tube site rated 8/10, and RLE pain. Pt unable to walk forward d/t unsteady gait and forward lean. Pt refused to use RW despite saying he uses a walker and cane at home. Pt titrated to 4L per RN. Returned to bed with all needs met - call bell within reach. Chair alarm on.   STS (x5) BLE exercises (x10): standing/ seated marches, seated leg extensions, and seated heel raises.   Isla Mari, BS Mobility Specialist Please contact via SecureChat or  Rehab office at [phone].

## 2024-04-09 NOTE — Progress Notes (Signed)
 Received patient in bed to unit.  Alert and oriented.  Informed consent signed and in chart.   TX duration: 2 hours and 3 minutes.  Patient Terminated session due to not being able to get comfortable.  AMA paperwork signed, and Dr. Lear Prosper informed.  Patient tolerated well.  Transported back to the room  Alert, without acute distress.  Hand-off given to patient's nurse.   Access used: Left AV fistula Upper arm Access issues: none  Total UF removed: 1.3L Medication(s) given: Hectoral   04/09/24 1018  Vitals  Temp 97.6 F (36.4 C)  Temp Source Oral  BP (!) 102/51  MAP (mmHg) 68  Pulse Rate 63  ECG Heart Rate 64  Resp 20  Oxygen Therapy  SpO2 96 %  O2 Device Room Air  During Treatment Monitoring  Duration of HD Treatment -hour(s) 2.05 hour(s) (2 hours and 3 minutes)  HD Safety Checks Performed Yes  Intra-Hemodialysis Comments See progress note (Patient terminated session early, AMA paper work signed.  PA ZeyFang informed.)  Dialysis Fluid Bolus Normal Saline  Bolus Amount (mL) 300 mL  Post Treatment  Dialyzer Clearance Clear  Liters Processed 49.1  Fluid Removed (mL) 1300 mL  Tolerated HD Treatment No (Comment)  Post-Hemodialysis Comments goal not met.  AMA paperwork signed.  PA Zeyfang notified  AVG/AVF Arterial Site Held (minutes) 7 minutes  AVG/AVF Venous Site Held (minutes) 7 minutes  Fistula / Graft Left Forearm  No Placement Date or Time found.   Orientation: Left  Access Location: Forearm  Status Deaccessed     Luciano Ruths LPN Kidney Dialysis Unit

## 2024-04-09 NOTE — Progress Notes (Signed)
 PT Cancellation Note  Patient Details Name: [name] MRN: [id number] DOB: 1951-12-12   Cancelled Treatment:    Reason Eval/Treat Not Completed: Patient at procedure or test/unavailable (HD). Will check back as time allows.   Amey Ka, PT  Acute Rehab Services Secure chat preferred Office [phone]    [name] 04/09/2024, 11:37 AM

## 2024-04-09 NOTE — Progress Notes (Signed)
 Pt receives out-pt HD at [facility] on TTS 5:20 am chair time. Will assist as needed.   Lauraine Polite Renal Navigator [phone]

## 2024-04-09 NOTE — Progress Notes (Signed)
 Physical Therapy Treatment Patient Details Name: ROKO RAYAN MRN: [id number] DOB: 21-Nov-1951 Today's Date: 04/09/2024   History of Present Illness Mr. Nevells is a 73 year old male who presented to the ED 4/25 for evaluation after a fall.  Found to have a mild to moderate right pleural effusion. S/p Thoracentesis with IR 4/25 and R CT placed 4/29. PMhx: CKD on HD Tuesday/Thursday/Saturday; CHF, diabetes, CKD, HTN.    PT Comments  Pt has R chest tube placed this AM after shortened HD session. Pt c/o RLE swelling and pain, esp R knee. Practiced bed mobility moving in a way to limit discomfort from chest tube. Pt stood EOB with min A but reported he could not stay up due to RLE discomfort. Tried to encourage him that with UE support on RW ambulation with be more tolerable. Will continue to try to advance mobility. PT will continue to follow.     If plan is discharge home, recommend the following: A little help with walking and/or transfers;Assistance with cooking/housework   Can travel by private vehicle        Equipment Recommendations  Rolling walker (2 wheels)    Recommendations for Other Services       Precautions / Restrictions Precautions Precautions: Fall Recall of Precautions/Restrictions: Intact Precaution/Restrictions Comments: fell PTA, BLE swelling, R knee OA Restrictions Weight Bearing Restrictions Per Provider Order: No     Mobility  Bed Mobility Overal bed mobility: Needs Assistance Bed Mobility: Supine to Sit, Sit to Supine     Supine to sit: Contact guard Sit to supine: Contact guard assist   General bed mobility comments: despite RLE swelling, pt was able to lift it against gravity to get back into bed    Transfers Overall transfer level: Needs assistance Equipment used: 1 person hand held assist Transfers: Sit to/from Stand Sit to Stand: Min assist           General transfer comment: pt stood with min A but reported that R LE too uncomfortable to  stay standing or ambulate    Ambulation/Gait               General Gait Details: pt deferred   Stairs             Wheelchair Mobility     Tilt Bed    Modified Rankin (Stroke Patients Only)       Balance Overall balance assessment: Mild deficits observed, not formally tested                                          Communication Communication Communication: No apparent difficulties  Cognition Arousal: Lethargic Behavior During Therapy: WFL for tasks assessed/performed   PT - Cognitive impairments: No apparent impairments                       PT - Cognition Comments: slower than usual to comprehend instructions this afternoon after procedure Following commands: Intact      Cueing Cueing Techniques: Verbal cues  Exercises General Exercises - Lower Extremity Ankle Circles/Pumps: AROM, Both, 15 reps, Seated    General Comments General comments (skin integrity, edema, etc.): discussed options for bed mobility to minimize discomfort at CT site. Pt demonstrated understanding.      Pertinent Vitals/Pain Pain Assessment Pain Assessment: Faces Faces Pain Scale: Hurts little more Pain Location: RLE, esp knee, noted  increased swelling and pt did not complete full HD session this AM Pain Descriptors / Indicators: Sore Pain Intervention(s): Repositioned    Home Living                          Prior Function            PT Goals (current goals can now be found in the care plan section) Acute Rehab PT Goals Patient Stated Goal: Move out of [facility] PT Goal Formulation: With patient Time For Goal Achievement: 04/19/24 Potential to Achieve Goals: Good Progress towards PT goals: Not progressing toward goals - comment (RLE pain)    Frequency    Min 1X/week      PT Plan      Co-evaluation              AM-PAC PT "6 Clicks" Mobility   Outcome Measure  Help needed turning from your back to your  side while in a flat bed without using bedrails?: None Help needed moving from lying on your back to sitting on the side of a flat bed without using bedrails?: A Little Help needed moving to and from a bed to a chair (including a wheelchair)?: A Little Help needed standing up from a chair using your arms (e.g., wheelchair or bedside chair)?: A Little Help needed to walk in hospital room?: A Little Help needed climbing 3-5 steps with a railing? : A Little 6 Click Score: 19    End of Session   Activity Tolerance: Patient tolerated treatment well Patient left: in bed;with call bell/phone within reach;with bed alarm set Nurse Communication: Mobility status;Other (comment) (wants food and is still npo) PT Visit Diagnosis: Unsteadiness on feet (R26.81);Other abnormalities of gait and mobility (R26.89);Repeated falls (R29.6);Muscle weakness (generalized) (M62.81);History of falling (Z91.81)     Time: 4098-1191 PT Time Calculation (min) (ACUTE ONLY): 16 min  Charges:    $Therapeutic Activity: 8-22 mins PT General Charges $$ ACUTE PT VISIT: 1 Visit                     Amey Ka, PT  Acute Rehab Services Secure chat preferred Office [phone]    Deloris Fetters Komal Stangelo 04/09/2024, 4:19 PM

## 2024-04-09 NOTE — Progress Notes (Signed)
 HD#2 Subjective:  Summary: [name] is a 73 year old male with past medical history of ESRD on hemodialysis, a fib on A/C, type 2 diabetes, and hypertension, who presented from skilled nursing facility-[facility] by EMS after he had a fall,  and found to have exudative pleural effusion.  Overnight Events: NOE.  This morning, was seen in the dialysis unit. Not reporting any new sxs. Scheduled for chest tube placement with IR today.  Objective:  Vital signs in last 24 hours: Vitals:   04/09/24 0748 04/09/24 0751 04/09/24 0801 04/09/24 0830  BP:  (!) 140/61 (!) 142/55 121/63  Pulse:   (!) 51 (!) 52  Resp:  19 15 (!) 6  Temp:  98.2 F (36.8 C)    TempSrc:  Oral    SpO2:  95% 99% 94%  Weight: 73.2 kg     Height:       Supplemental O2: Room Air SpO2: 94 %   Physical Exam:  Lying in bed, NAD.   Heart with regular rate and rhythm, no murmurs. Clear bilateral lungs, normal air movement. Lower extremities with bilateral dermatitis and chronic venous disease/lymphedema. L AVF access with palpable thrill  Filed Weights   04/07/24 0046 04/07/24 0356 04/09/24 0748  Weight: 78 kg 70.4 kg 73.2 kg    No intake or output data in the 24 hours ending 04/09/24 0851  Net IO Since Admission: -560 mL [04/09/24 0851]  No results for input(s): "GLUCAP" in the last 72 hours.    Pertinent Labs:    Latest Ref Rng & Units 04/09/2024    4:52 AM 04/08/2024    9:22 PM 04/08/2024    4:57 AM  CBC  WBC 4.0 - 10.5 K/uL 7.3   7.0   Hemoglobin 13.0 - 17.0 g/dL 40.9   81.1   Hematocrit 39.0 - 52.0 % 36.9  38.3  37.0   Platelets 150 - 400 K/uL 148   126        Latest Ref Rng & Units 04/09/2024    7:58 AM 04/08/2024    4:57 AM 04/06/2024    6:03 AM  CMP  Glucose 70 - 99 mg/dL 73  79  86   BUN 8 - 23 mg/dL 48  33  29   Creatinine 0.61 - 1.24 mg/dL 9.14  7.82  9.56   Sodium 135 - 145 mmol/L 135  133  137   Potassium 3.5 - 5.1 mmol/L 4.3  4.2  3.4   Chloride 98 - 111 mmol/L 96  97  99    CO2 22 - 32 mmol/L 27  24  30    Calcium  8.9 - 10.3 mg/dL 9.5  9.1  9.2     Imaging: No results found.   Assessment/Plan:   Principal Problem:   Pleural effusion Active Problems:   Stage 5 chronic kidney disease on chronic dialysis [facility])   Fall   Patient Summary: [name] is a 73 y.o. with a pertinent PMH of A-fib on A/C, type 2 diabetes, hypertension, ESRD on hemodialysis who presented from SNF after a fall, found to have right pleural effusion, S/p thoracentesis x2.   #Lymphocytic predominant exudative pleural effusion Asymptomatic, no clear etiology yet. Cytology of pleural fluid showed chronic inflammation, no malignant cells.  Low concern for autoimmune process given negative ANA.  AFB smear negative.  Possible etiologies infection versus malignancy.  I doubt this is hemothorax as the fluid was described as serosanguineous. Eliquis  has been held for 72 hours, will undergo  CT-guided chest tube placement today.  On Unasyn for presumed lung infection.  Plan:  - Pulmonology and IR on board, appreciate recommendations  - Chest tube placement by IR today.  - Cw Unasyn.  # ESRD (Tue/Th/ Sat) # Normocytic anemia Mild hyponatremia, Hb stable.  -Monitor BMP.  Chronic medical conditions:   #Hypothyroidism -Levothyroxine 50 mcg.   #Hypertension - Lisinopril  40 mg. - Hydralazine  25 mg PRN  Diet: Renal diet IVF: PO intake VTE:  Code: Full PT/OT: Pending ID:  Anti-infectives (From admission, onward)    Start     Dose/Rate Route Frequency Ordered Stop   04/06/24 1800  Ampicillin-Sulbactam (UNASYN) 3 g in sodium chloride  0.9 % 100 mL IVPB        3 g 200 mL/hr over 30 Minutes Intravenous Every 24 hours 04/06/24 1444        Anticipated discharge to Skilled nursing facility pending medical stabilization.  Marni Sins, MD 04/09/2024, 8:51 AM Pager: [phone] [facility]

## 2024-04-09 NOTE — Plan of Care (Signed)
   Problem: Education: Goal: Knowledge of General Education information will improve Description: Including pain rating scale, medication(s)/side effects and non-pharmacologic comfort measures Outcome: Completed/Met

## 2024-04-09 NOTE — Progress Notes (Signed)
 Subjective: Was seen in hemodialysis, no shortness of breath, no complaints except wants to cut HD time today as he is getting a chest tube he said.  Objective Vital signs in last 24 hours: Vitals:   04/09/24 1018 04/09/24 1026 04/09/24 1031 04/09/24 1120  BP: (!) 102/51 (!) 123/53  (!) 149/54  Pulse: 63 (!) 56  63  Resp: 20 20  18   Temp: 97.6 F (36.4 C)     TempSrc: Oral     SpO2: 96% 99%  95%  Weight:   71.9 kg   Height:       Weight change:   Physical Exam: General: Alert pleasant  chronically ill-appearing elderly male NAD Heart: RRR no MRG Lungs: Decreased breath sounds on right side, left clear nonlabored breathing on room air Abdomen: NABS soft NT ND Extremities: woody hard appearing pedal edema minimal, with dry flaky skin Dialysis Acces =left arm AV fistula positive bruit   Dialysis Orders: East, T,Th, S 4 hrs 180NRe 450/Autoflow 1.5 74.6 kg 2.0 K/ 2.0 Ca AVF - Heparin  3000 units IV initial bolus and 3000 units IV mid run - Hectorol  6 mcg IV three times per week - Sensipar  30 mg PO three times per week - Venofer  50 mg IV weekly     Problem/Plan:  R pleural effusion with possible lung Ca = pulmonology consulting, noted for CT-guided chest tube placement today.   Hypokalemia: present on admission.  K4.3 today follow labs.  Use high K bath  Mechanical Fall-CT of head without acute injury. Per primary  ESRD -HD TTS on dialysis schedule   Hypertension/volume  - hypotensive when seen by EMS. BP stable now. Continue home meds. He seems to be losing wt, Will attempt to lower and optimize volume with HD. UF as tolerated.   Anemia  - HGB at 10.8  Follow labs.   Metabolic bone disease -corrected calcium  over 10 will hold VDRA, continue Sensipar , velphoro  binders.  Phosphorus 5.4  Nutrition - Low albumin, 2.9 low K+. Can have regular diet with fluid restrictions, protein supplement   Charlynne Coombes, PA-C [facility] Beeper [phone] 04/09/2024,11:35 AM  LOS:  3 days   Labs: Basic Metabolic Panel: Recent Labs  Lab 04/06/24 0603 04/08/24 0457 04/09/24 0758  NA 137 133* 135  K 3.4* 4.2 4.3  CL 99 97* 96*  CO2 30 24 27   GLUCOSE 86 79 73  BUN 29* 33* 48*  CREATININE 7.13* 7.55* 9.19*  CALCIUM  9.2 9.1 9.5  PHOS  --  5.4* 5.4*   Liver Function Tests: Recent Labs  Lab 04/05/24 1753 04/08/24 0457 04/09/24 0758  AST 10*  --   --   ALT 8  --   --   ALKPHOS 35*  --   --   BILITOT 0.5  --   --   PROT 6.2*  --   --   ALBUMIN 2.9* 2.7* 2.8*   No results for input(s): "LIPASE", "AMYLASE" in the last 168 hours. No results for input(s): "AMMONIA" in the last 168 hours. CBC: Recent Labs  Lab 04/05/24 0849 04/06/24 0603 04/08/24 0457 04/08/24 2122 04/09/24 0452  WBC 5.8 5.4 7.0  --  7.3  NEUTROABS  --   --  4.9  --   --   HGB 12.2* 10.8* 11.7*  --  11.6*  HCT 40.2 34.6* 37.0* 38.3* 36.9*  MCV 95.3 94.3 93.9  --  93.2  PLT 146* 118* 126*  --  148*   Cardiac Enzymes: No  results for input(s): "CKTOTAL", "CKMB", "CKMBINDEX", "TROPONINI" in the last 168 hours. CBG: Recent Labs  Lab 04/05/24 2013  GLUCAP 117*    Studies/Results: DG CHEST PORT 1 VIEW Result Date: 04/08/2024 CLINICAL DATA:  Pleural effusion.  Follow-. EXAM: PORTABLE CHEST 1 VIEW COMPARISON:  Radiographs 04/07/2024 and 04/05/2024.  CT 04/06/2024. FINDINGS: 0749 hours. The heart size and mediastinal contours are stable with aortic atherosclerosis. Unchanged residual right pleural effusion with associated right basilar airspace disease. Unchanged calcification over the left hemidiaphragm. The left lung appears clear. No evidence of pneumothorax. The bones appear unchanged. IMPRESSION: Unchanged residual right pleural effusion with associated right basilar airspace disease. No evidence of pneumothorax. Electronically Signed   By: Elmon Hagedorn M.D.   On: 04/08/2024 11:40   US  THORACENTESIS ASP PLEURAL SPACE W/IMG GUIDE Result Date: 04/07/2024 INDICATION: [id number] Pleural  effusion 3452 73 year old male with recurrent pleural effusion after fall on Eliquis . IR was requested for therapeutic thoracentesis. EXAM: ULTRASOUND GUIDED THERAPEUTIC THORACENTESIS MEDICATIONS: 7 cc of 1% lidocaine  COMPLICATIONS: None immediate. PROCEDURE: An ultrasound guided thoracentesis was thoroughly discussed with the patient and questions answered. The benefits, risks, alternatives and complications were also discussed. The patient understands and wishes to proceed with the procedure. Written consent was obtained. Ultrasound was performed to localize and mark an adequate pocket of fluid in the right chest. The area was then prepped and draped in the normal sterile fashion. 1% Lidocaine  was used for local anesthesia. Under ultrasound guidance a 6 Fr Safe-T-Centesis catheter was introduced. Thoracentesis was performed. The catheter was removed and a dressing applied. FINDINGS: A total of approximately 625 mL of serosanguineous pleural fluid was removed. IMPRESSION: Successful ultrasound guided RIGHT thoracentesis yielding 625 mL of pleural fluid. Procedure performed by Lambert Pillion, PA-C Electronically Signed   By: Art Largo M.D.   On: 04/07/2024 16:41   DG Chest 1 View Result Date: 04/07/2024 CLINICAL DATA:  Pleural effusion.  The EXAM: CHEST  1 VIEW COMPARISON:  04/05/2024. FINDINGS: Prominent cardiac silhouette. Right base consolidation and moderate right-sided pleural effusion. Findings have improved compared prior study. Normal pulmonary vasculature. Minimal left base subsegmental atelectasis. Calcified plaque left base. IMPRESSION: Right base consolidation and effusion with improvement. Electronically Signed   By: Sydell Eva M.D.   On: 04/07/2024 14:35   Medications:  ampicillin-sulbactam (UNASYN) IV 3 g (04/08/24 1745)   anticoagulant sodium citrate      (feeding supplement) PROSource Plus  30 mL Oral BID BM   aspirin   81 mg Oral Daily   atorvastatin   80 mg Oral QHS    Chlorhexidine  Gluconate Cloth  6 each Topical Q0600   Chlorhexidine  Gluconate Cloth  6 each Topical Q0600   cinacalcet   30 mg Oral Q T,Th,Sat-1800   doxercalciferol   6 mcg Intravenous Q T,Th,Sa-HD   feeding supplement (NEPRO CARB STEADY)  237 mL Oral BID BM   heparin  sodium (porcine)  2,000 Units Intra-arterial Once   hydrALAZINE   25 mg Oral TID   hydrocerin   Topical BID   levothyroxine  50 mcg Oral Q0600   lidocaine  (PF)  10 mL Intradermal Once   lisinopril   40 mg Oral QHS   multivitamin  1 tablet Oral QHS   sucroferric oxyhydroxide  1,000 mg Oral TID WC

## 2024-04-09 NOTE — Plan of Care (Signed)
  Problem: Metabolic: Goal: Ability to maintain appropriate glucose levels will improve Outcome: Progressing   Problem: Nutritional: Goal: Maintenance of adequate nutrition will improve Outcome: Progressing   Problem: Skin Integrity: Goal: Risk for impaired skin integrity will decrease Outcome: Progressing   Problem: Clinical Measurements: Goal: Ability to maintain clinical measurements within normal limits will improve Outcome: Progressing Goal: Will remain free from infection Outcome: Progressing   Problem: Activity: Goal: Risk for activity intolerance will decrease Outcome: Progressing   Problem: Pain Managment: Goal: General experience of comfort will improve and/or be controlled Outcome: Progressing

## 2024-04-09 NOTE — Procedures (Signed)
 Interventional Radiology Procedure Note  Procedure: Image guided right chest tube placement,  20F pigtail drain.  Complications: None  EBL: None    Recommendations: - Routine chest tube care, record output - ok to restart all meds, including AC if needed - routine wound care  Signed,  Marciano Settles. Mabel Savage, DO

## 2024-04-09 NOTE — Progress Notes (Signed)
 Reported to 7M nurse Mathis Som, RN, also informed her that patient will go to IR after.  Called IR and talked to Lavonia Powers, RN-Charge Nurse in IR and informed him we will send patient in transport to IR front desk per his instructions.

## 2024-04-10 ENCOUNTER — Inpatient Hospital Stay (HOSPITAL_COMMUNITY)

## 2024-04-10 DIAGNOSIS — J90 Pleural effusion, not elsewhere classified: Secondary | ICD-10-CM | POA: Diagnosis not present

## 2024-04-10 DIAGNOSIS — Z992 Dependence on renal dialysis: Secondary | ICD-10-CM | POA: Diagnosis not present

## 2024-04-10 DIAGNOSIS — R918 Other nonspecific abnormal finding of lung field: Secondary | ICD-10-CM | POA: Diagnosis not present

## 2024-04-10 DIAGNOSIS — N186 End stage renal disease: Secondary | ICD-10-CM | POA: Diagnosis not present

## 2024-04-10 LAB — BODY FLUID CULTURE W GRAM STAIN: Gram Stain: NONE SEEN

## 2024-04-10 LAB — CYCLIC CITRUL PEPTIDE ANTIBODY, IGG/IGA: CCP Antibodies IgG/IgA: 8 U (ref 0–19)

## 2024-04-10 NOTE — Progress Notes (Signed)
 HD#5 Subjective:  Summary: [name] is a 73 year old male with past medical history of ESRD on hemodialysis, a fib on A/C, type 2 diabetes, and hypertension, who presented from skilled nursing facility[facility] by EMS after he had a fall,  and found to have exudative pleural effusion.  Overnight Events: NOE. Status post chest tube placement, with about 1000 cc serosanguineous drainage from chest tube.  Objective:  Vital signs in last 24 hours: Vitals:   04/09/24 1647 04/09/24 2109 04/10/24 0504 04/10/24 0825  BP: (!) 113/52 (!) 134/55 (!) 113/37 (!) 138/99  Pulse: 70 67 (!) 46 83  Resp: 16 16 16 18   Temp: 98 F (36.7 C) 97.6 F (36.4 C) (!) 97.5 F (36.4 C) 97.8 F (36.6 C)  TempSrc: Oral Oral Oral Oral  SpO2: (!) 87% 100% 100% 100%  Weight:  80.4 kg    Height:       Supplemental O2: Room Air SpO2: 100 % O2 Flow Rate (L/min): 2 L/min   Physical Exam:  Sitting in chair, intermittently somnolent. Heart with regular rate and rhythm, no murmurs. Diminished breath sounds in bilateral lung base. Lower extremities with bilateral dermatitis and chronic venous disease/lymphedema. L AVF access with palpable thrill  Filed Weights   04/09/24 0748 04/09/24 1031 04/09/24 2109  Weight: 73.2 kg 71.9 kg 80.4 kg     Intake/Output Summary (Last 24 hours) at 04/10/2024 1112 Last data filed at 04/10/2024 0600 Gross per 24 hour  Intake 440 ml  Output 750 ml  Net -310 ml    Net IO Since Admission: -2,170 mL [04/10/24 1112]  No results for input(s): "GLUCAP" in the last 72 hours.    Pertinent Labs:    Latest Ref Rng & Units 04/09/2024    4:52 AM 04/08/2024    9:22 PM 04/08/2024    4:57 AM  CBC  WBC 4.0 - 10.5 K/uL 7.3   7.0   Hemoglobin 13.0 - 17.0 g/dL 74.2   59.5   Hematocrit 39.0 - 52.0 % 36.9  38.3  37.0   Platelets 150 - 400 K/uL 148   126        Latest Ref Rng & Units 04/09/2024    7:58 AM 04/08/2024    4:57 AM 04/06/2024    6:03 AM  CMP  Glucose 70 - 99 mg/dL  73  79  86   BUN 8 - 23 mg/dL 48  33  29   Creatinine 0.61 - 1.24 mg/dL 6.38  7.56  4.33   Sodium 135 - 145 mmol/L 135  133  137   Potassium 3.5 - 5.1 mmol/L 4.3  4.2  3.4   Chloride 98 - 111 mmol/L 96  97  99   CO2 22 - 32 mmol/L 27  24  30    Calcium  8.9 - 10.3 mg/dL 9.5  9.1  9.2     Imaging: DG CHEST PORT 1 VIEW Result Date: 04/10/2024 CLINICAL DATA:  [id number]. Right chest tube in place. Placed yesterday by radiology. EXAM: PORTABLE CHEST 1 VIEW COMPARISON:  Portable chest 04/08/2024, chest CT with contrast 04/07/2019 FINDINGS: 4:10 a.m. Small caliber pleural tube with a curved tip is noted extending horizontally within the base of the right chest with the tip in the medial lower thorax. There is at least a small right pleural effusion remaining. Some of this is probably loculated and does not appear to pool completely dependently. There is heterogeneous aeration in the right lower lung field, increased lucency where there was  previously fluid opacification and could indicate pneumothorax or partial Reaeration of the right lower zone. If this is a pneumothorax there is no apical component or visible pleural line. The remaining lungs are generally clear. There is calcified pleural plaquing along the dome of the left diaphragm. The mediastinum is stable with aortic atherosclerosis and tortuosity. There is mild cardiomegaly without evidence of CHF. No new osseous findings. IMPRESSION: 1. Small caliber right pleural tube with the tip in the medial lower thorax. 2. There is at least a small right pleural effusion remaining, some of which is probably loculated and does not appear to pool completely dependently. 3. Heterogeneous aeration in the right lower lung field, with increased lucency where there was previously fluid opacification and could indicate pneumothorax or partial Reaeration of the right lower zone. If this is a pneumothorax then there is no apical component or visible pleural line. 4.  Calcified pleural plaquing along the dome of the left diaphragm. 5. Aortic atherosclerosis. 6. Mild cardiomegaly. Electronically Signed   By: Denman Fischer M.D.   On: 04/10/2024 06:26   IR IMAGE GUIDED FLUID DRAIN BY CATHETER Result Date: 04/09/2024 INDICATION: 73 year old male referred for chest tube placement EXAM: IMAGE GUIDED RIGHT CHEST TUBE PLACEMENT MEDICATIONS: 4 mg IV Zofran  ANESTHESIA/SEDATION: Fentanyl  25 mcg IV; Versed  0.5 mg IV Moderate Sedation Time:  10 minutes The patient was continuously monitored during the procedure by the interventional radiology nurse under my direct supervision. COMPLICATIONS: None PROCEDURE: The procedure, risks, benefits, and alternatives were explained to the patient/patient's family, who provided informed consent on the patient's behalf. Specific risks that were addressed included bleeding, infection, ongoing pneumothorax, need for further procedure/surgery, chance of hemorrhage, hemoptysis, cardiopulmonary collapse, death. Questions regarding the procedure were encouraged and answered. The patient understands and consents to the procedure. Patient was positioned in the right anterior oblique position on the IR table and scout image of the chest was performed for planning purposes. The right mid axillary line at the level of the nipple was identified, and prepped and draped in the usual sterile fashion. The skin and subcutaneous tissues were generously infiltrated 1% lidocaine  for local anesthesia. Ultrasound was used to navigate access catheter into the right chest. A Yueh needle was then used to enter the pleural space with aspiration of fluid. The plastic Yueh catheter was advanced into the pleural space and an 035 guidewire was advanced the pleural space under fluoroscopy. Dilation of the skin tract was performed over the wire, and then modified Seldinger technique was used to place a 10 French pigtail catheter . Catheter was attached to water seal chamber and  suction was applied confirming a operational chest tube. Retention suture was placed.  Sterile dressing was placed. Patient tolerated the procedure well and remained hemodynamically stable throughout. No complications were encountered and no significant blood loss was encounter IMPRESSION: Status post image guided right-sided thoracostomy tube placement. Signed, Marciano Settles. Rexine Cater, RPVI [facility] Radiology Electronically Signed   By: Myrlene Asper D.O.   On: 04/09/2024 13:57     Assessment/Plan:   Principal Problem:   Pleural effusion Active Problems:   Stage 5 chronic kidney disease on chronic dialysis [facility])   Fall   Patient Summary: [name] is a 73 y.o. with a pertinent PMH of A-fib on A/C, type 2 diabetes, hypertension, ESRD on hemodialysis who presented from SNF after a fall, found to have right pleural effusion, S/p thoracentesis x2.   # Lymphocytic predominant  exudative pleural effusion # Right lung mass vs atelectasis Chest tube placed yesterday, with good output.  Will monitor chest tube output and if output drops tomorrow, will consider repeat chest CT to reevaluate the suspicious lung mass.  Will follow culture unremarkable , cytology negative.  Will complete Unasyn for presumed lung infection on 5/1.  Plan:  - Pulmonology and IR on board, appreciate recommendations  - Monitor chest tube output. - Cw Unasyn # day 4/5  # ESRD (Tue/Th/ Sat) # Normocytic anemia Electrolytes, Hb stable.  -Monitor BMP.  Chronic medical conditions:   #Hypothyroidism -Levothyroxine 50 mcg.   #Hypertension - Lisinopril  40 mg. - Hydralazine  25 mg PRN  Diet: Renal diet IVF: PO intake VTE: apixaban  (ELIQUIS ) tablet 5 mg  Code: Full PT/OT: Pending ID:  Anti-infectives (From admission, onward)    Start     Dose/Rate Route Frequency Ordered Stop   04/06/24 1800  Ampicillin-Sulbactam (UNASYN) 3 g in sodium chloride  0.9 % 100  mL IVPB        3 g 200 mL/hr over 30 Minutes Intravenous Every 24 hours 04/06/24 1444        Anticipated discharge to Skilled nursing facility pending medical stabilization.  Marni Sins, MD 04/10/2024, 11:12 AM Pager: [phone] [facility]

## 2024-04-10 NOTE — Plan of Care (Signed)
  Problem: Health Behavior/Discharge Planning: Goal: Ability to manage health-related needs will improve Outcome: Progressing   Problem: Clinical Measurements: Goal: Ability to maintain clinical measurements within normal limits will improve Outcome: Progressing Goal: Will remain free from infection Outcome: Progressing Goal: Diagnostic test results will improve Outcome: Progressing Goal: Respiratory complications will improve Outcome: Progressing Goal: Cardiovascular complication will be avoided Outcome: Progressing   Problem: Activity: Goal: Risk for activity intolerance will decrease Outcome: Progressing   Problem: Nutrition: Goal: Adequate nutrition will be maintained Outcome: Progressing   Problem: Coping: Goal: Level of anxiety will decrease Outcome: Progressing   Problem: Elimination: Goal: Will not experience complications related to bowel motility Outcome: Progressing Goal: Will not experience complications related to urinary retention Outcome: Progressing   Problem: Pain Managment: Goal: General experience of comfort will improve and/or be controlled Outcome: Progressing   Problem: Safety: Goal: Ability to remain free from injury will improve Outcome: Progressing   Problem: Skin Integrity: Goal: Risk for impaired skin integrity will decrease Outcome: Progressing   Problem: Education: Goal: Knowledge of disease and its progression will improve Outcome: Progressing   Problem: Fluid Volume: Goal: Compliance with measures to maintain balanced fluid volume will improve Outcome: Progressing   Problem: Health Behavior/Discharge Planning: Goal: Ability to manage health-related needs will improve Outcome: Progressing   Problem: Nutritional: Goal: Ability to make healthy dietary choices will improve Outcome: Progressing   Problem: Clinical Measurements: Goal: Complications related to the disease process, condition or treatment will be avoided or  minimized Outcome: Progressing

## 2024-04-10 NOTE — TOC Progression Note (Signed)
 Transition of Care [facility]) - Progression Note    Patient Details  Name: [name] MRN: [id number] Date of Birth: [birth year]  Transition of Care [facility] CM/SW Contact  [name], Patrick Resler [name], RN Phone Number: 04/10/2024, 4:13 PM  Clinical Narrative:     CM spoke with patient at bedside about outpatient recommendations. Patient states [name] is from [facility] and will return at discharge. Patient requested CM to speak with his significant other, [name]. [name] confirmed patient will return to [facility] at discharge.   Patient is with Rt sided Effusion, has a Rt Chest tube and has undergone Thoracentesis by IR. Nephrology following for inpatient HD.   Patient not Medically ready for discharge.  LCSW to follow for return to [facility] as patient progresses with care towards discharge.      Expected Discharge Plan: Skilled Nursing Facility Barriers to Discharge: Continued Medical Work up  Expected Discharge Plan and Services In-house Referral: Clinical Social Work   Post Acute Care Choice: Skilled Nursing Facility Living arrangements for the past 2 months: Skilled Nursing Facility                                       Social Determinants of Health (SDOH) Interventions SDOH Screenings   Food Insecurity: No Food Insecurity (04/05/2024)  Housing: Low Risk  (04/05/2024)  Transportation Needs: No Transportation Needs (04/05/2024)  Utilities: Not At Risk (04/05/2024)  Social Connections: Moderately Isolated (04/05/2024)  Tobacco Use: Medium Risk (04/05/2024)    Readmission Risk Interventions     No data to display

## 2024-04-10 NOTE — Progress Notes (Signed)
   NAME:  [name], MRN:  [id number], DOB:  [birth year], LOS: 4 ADMISSION DATE:  04/05/2024, CONSULTATION DATE:  04/06/2024 REFERRING MD:  Kirt Pereyra MD, CHIEF COMPLAINT:   Pleural effusion, abnormal CT  History of Present Illness:   73 year old with history of end-stage renal disease on hemodialysis, hypothyroidism, hypertension, atrial fibrillation on Eliquis  Presenting after fall out of bed yesterday morning.  He fell on the right side and noted to have right-sided pleural effusion Underwent thoracentesis by IR yesterday with findings of bloody exudative effusion with lymphocyte predominance Follow-up CT shows possible lung mass and persistent moderate effusion and PCCM consulted for help with management.  He is a ex-smoker but quit at the age of 73.  Used to work in transportation.  May have had some exposure to asbestos in the past but cannot recall clearly Ongoing exposures.  Pertinent  Medical History    has a past medical history of Arthritis, Asthma, Chronic cystitis, Chronic diastolic heart failure (HCC), Chronic kidney disease, Diabetes mellitus, Dysrhythmia, GERD (gastroesophageal reflux disease), H/O hiatal hernia, Hyperlipidemia, Hypertension, Lymphedema, Morbid obesity (HCC), NEPHROLITHIASIS, HX OF (12/02/2009), PVD (peripheral vascular disease) (HCC), Renal insufficiency, UMBILICAL HERNIA (02/08/2007), and Venous insufficiency.   Significant Hospital Events: Including procedures, antibiotic start and stop dates in addition to other pertinent events   4/25 admit, right thoracentesis delayed due to anticoagulation 4/26 no issues overnight, awaiting IR thoracentesis with tentative plans for IR placed smallbore chest tube tomorrow 4/27 IR guided thoracentesis  Interim History / Subjective:  Continue to monitor chest tube drainage  Objective   Blood pressure (!) 113/37, pulse (!) 46, temperature (!) 97.5 F (36.4 C), temperature source Oral, resp. rate 16, height 5\' 10"   (1.778 m), weight 80.4 kg, SpO2 100%.        Intake/Output Summary (Last 24 hours) at 04/10/2024 0820 Last data filed at 04/10/2024 0600 Gross per 24 hour  Intake 440 ml  Output 2050 ml  Net -1610 ml   Filed Weights   04/09/24 0748 04/09/24 1031 04/09/24 2109  Weight: 73.2 kg 71.9 kg 80.4 kg    Examination: Elderly man attempting to get out of bed despite having a right chest tube No respiratory distress is noted.  Minich breath sounds on the right side right sided chest tube 10 [place] is noted to be in place draining serosanguineous fluid is blood-tinged with 1000 cc over the last 24 hours Heart sounds are regular regular rate and rhythm Abdomen is soft and nontender Extremities with chronic lower extremity stasis  Resolved Hospital Problem list     Assessment & Plan:  Right sided pleural effusion, exudative, lymphocyte predominant Possible right lung mass vs rounded atelectasis noted on CT Chest H/o former remote tobacco use ESRD on HD  Status post 10 French right chest tube per interventional radiology on 04/09/2024 Approximately 1000 cc of serosanguineous fluid has been drained since that time. Continue to monitor drainage May need follow-up CT plus or minus lytics in the near future  [facility] Nakeysha Pasqual ACNP Acute Care Nurse Practitioner Jon[facility] Neu Pulmonary/Critical Care Please consult Amion 04/10/2024, 8:23 AM

## 2024-04-10 NOTE — Progress Notes (Signed)
 Mobility Specialist Progress Note:    04/10/24 0900  Mobility  Activity Transferred from bed to chair  Level of Assistance Minimal assist, patient does 75% or more  Assistive Device Other (Comment) (HHA)  Distance Ambulated (ft) 4 ft  Activity Response Tolerated well  Mobility Referral Yes  Mobility visit 1 Mobility  Mobility Specialist Start Time (ACUTE ONLY) [id number]  Mobility Specialist Stop Time (ACUTE ONLY) 0900  Mobility Specialist Time Calculation (min) (ACUTE ONLY) 8 min   Pt received on EOB and agreeable. Required minA/HHA to stand and step to chair. No complaints throughout. Pt left in chair with call bell and all needs met.  [place]ante Nolon Baxter Mobility Specialist Please contact via Special educational needs teacher or Rehab office at [phone]

## 2024-04-10 NOTE — Progress Notes (Signed)
 Subjective: Seen in room sitting up on side of bed, denies any complaints /chest tube placed yesterday and hemodialysis yesterday on schedule he signed off early states he was anticipating chest tube, wanted to be off earlier than usual   Objective Vital signs in last 24 hours: Vitals:   04/09/24 1647 04/09/24 2109 04/10/24 0504 04/10/24 0825  BP: (!) 113/52 (!) 134/55 (!) 113/37 (!) 138/99  Pulse: 70 67 (!) 46 83  Resp: 16 16 16 18   Temp: 98 F (36.7 C) 97.6 F (36.4 C) (!) 97.5 F (36.4 C) 97.8 F (36.6 C)  TempSrc: Oral Oral Oral Oral  SpO2: (!) 87% 100% 100% 100%  Weight:  80.4 kg    Height:       Weight change:   Physical Exam: General: Alert, chronically ill male NAD Heart: RRR no MRG Lungs: Diminished breath sounds right side nonlabored breathing Abdomen: NABS soft NTND Extremities: Lower extremities hard woody appearance/lymphedema with bilateral chronic venous type skin changes Dialysis Access: Positive bruit La AVF   Dialysis Orders: East, T,Th, S 4 hrs 180NRe 450/Autoflow 1.5 74.6 kg 2.0 K/ 2.0 Ca AVF - Heparin  3000 units IV initial bolus and 3000 units IV mid run - Hectorol  6 mcg IV three times per week - Sensipar  30 mg PO three times per week - Venofer  50 mg IV weekly     Problem/Plan:  R pleural effusion with possible lung Ca versus atelectasis = pulmonology consulting, noted for CT-guided chest tube placement  4/29,  noted pleural fluid cytology negative  Hypokalemia: present on admission.  K4.3 follow labs.  Adjust dialysis lab per protocol  Mechanical Fall-CT of head without acute injury. Per primary  ESRD -HD TTS on dialysis schedule   Hypertension/volume  - hypotensive when seen by EMS. BP stable now. Continue home meds. He seems to be losing wt, Will attempt to lower and optimize volume with HD. UF as tolerated.   Anemia  - HGB at 11.6  follow labs.   Metabolic bone disease -corrected calcium  over 10 will hold VDRA, continue Sensipar , velphoro   binders.  Phosphorus 5.4  Nutrition - Low albumin, 2.8, low K+. Can have regular diet with fluid restrictions, protein supplement    Charlynne Coombes, PA-C [facility] [phone] 04/10/2024,12:20 PM  LOS: 4 days   Labs: Basic Metabolic Panel: Recent Labs  Lab 04/06/24 0603 04/08/24 0457 04/09/24 0758  NA 137 133* 135  K 3.4* 4.2 4.3  CL 99 97* 96*  CO2 30 24 27   GLUCOSE 86 79 73  BUN 29* 33* 48*  CREATININE 7.13* 7.55* 9.19*  CALCIUM  9.2 9.1 9.5  PHOS  --  5.4* 5.4*   Liver Function Tests: Recent Labs  Lab 04/05/24 1753 04/08/24 0457 04/09/24 0758  AST 10*  --   --   ALT 8  --   --   ALKPHOS 35*  --   --   BILITOT 0.5  --   --   PROT 6.2*  --   --   ALBUMIN 2.9* 2.7* 2.8*   No results for input(s): "LIPASE", "AMYLASE" in the last 168 hours. No results for input(s): "AMMONIA" in the last 168 hours. CBC: Recent Labs  Lab 04/05/24 0849 04/06/24 0603 04/08/24 0457 04/08/24 2122 04/09/24 0452  WBC 5.8 5.4 7.0  --  7.3  NEUTROABS  --   --  4.9  --   --   HGB 12.2* 10.8* 11.7*  --  11.6*  HCT 40.2 34.6* 37.0* 38.3*  36.9*  MCV 95.3 94.3 93.9  --  93.2  PLT 146* 118* 126*  --  148*   Cardiac Enzymes: No results for input(s): "CKTOTAL", "CKMB", "CKMBINDEX", "TROPONINI" in the last 168 hours. CBG: Recent Labs  Lab 04/05/24 2013  GLUCAP 117*    Studies/Results: DG CHEST PORT 1 VIEW Result Date: 04/10/2024 CLINICAL DATA:  [id number]. Right chest tube in place. Placed yesterday by radiology. EXAM: PORTABLE CHEST 1 VIEW COMPARISON:  Portable chest 04/08/2024, chest CT with contrast 04/07/2019 FINDINGS: 4:10 a.m. Small caliber pleural tube with a curved tip is noted extending horizontally within the base of the right chest with the tip in the medial lower thorax. There is at least a small right pleural effusion remaining. Some of this is probably loculated and does not appear to pool completely dependently. There is heterogeneous aeration in the right  lower lung field, increased lucency where there was previously fluid opacification and could indicate pneumothorax or partial Reaeration of the right lower zone. If this is a pneumothorax there is no apical component or visible pleural line. The remaining lungs are generally clear. There is calcified pleural plaquing along the dome of the left diaphragm. The mediastinum is stable with aortic atherosclerosis and tortuosity. There is mild cardiomegaly without evidence of CHF. No new osseous findings. IMPRESSION: 1. Small caliber right pleural tube with the tip in the medial lower thorax. 2. There is at least a small right pleural effusion remaining, some of which is probably loculated and does not appear to pool completely dependently. 3. Heterogeneous aeration in the right lower lung field, with increased lucency where there was previously fluid opacification and could indicate pneumothorax or partial Reaeration of the right lower zone. If this is a pneumothorax then there is no apical component or visible pleural line. 4. Calcified pleural plaquing along the dome of the left diaphragm. 5. Aortic atherosclerosis. 6. Mild cardiomegaly. Electronically Signed   By: Denman Fischer M.D.   On: 04/10/2024 06:26   IR IMAGE GUIDED FLUID DRAIN BY CATHETER Result Date: 04/09/2024 INDICATION: 73 year old male referred for chest tube placement EXAM: IMAGE GUIDED RIGHT CHEST TUBE PLACEMENT MEDICATIONS: 4 mg IV Zofran  ANESTHESIA/SEDATION: Fentanyl  25 mcg IV; Versed  0.5 mg IV Moderate Sedation Time:  10 minutes The patient was continuously monitored during the procedure by the interventional radiology nurse under my direct supervision. COMPLICATIONS: None PROCEDURE: The procedure, risks, benefits, and alternatives were explained to the patient/patient's family, who provided informed consent on the patient's behalf. Specific risks that were addressed included bleeding, infection, ongoing pneumothorax, need for further  procedure/surgery, chance of hemorrhage, hemoptysis, cardiopulmonary collapse, death. Questions regarding the procedure were encouraged and answered. The patient understands and consents to the procedure. Patient was positioned in the right anterior oblique position on the IR table and scout image of the chest was performed for planning purposes. The right mid axillary line at the level of the nipple was identified, and prepped and draped in the usual sterile fashion. The skin and subcutaneous tissues were generously infiltrated 1% lidocaine  for local anesthesia. Ultrasound was used to navigate access catheter into the right chest. A Yueh needle was then used to enter the pleural space with aspiration of fluid. The plastic Yueh catheter was advanced into the pleural space and an 035 guidewire was advanced the pleural space under fluoroscopy. Dilation of the skin tract was performed over the wire, and then modified Seldinger technique was used to place a 10 French pigtail catheter . Catheter was attached to  water seal chamber and suction was applied confirming a operational chest tube. Retention suture was placed.  Sterile dressing was placed. Patient tolerated the procedure well and remained hemodynamically stable throughout. No complications were encountered and no significant blood loss was encounter IMPRESSION: Status post image guided right-sided thoracostomy tube placement. Signed, Marciano Settles. Rexine Cater, RPVI [facility] Electronically Signed   By: Myrlene Asper D.O.   On: 04/09/2024 13:57   Medications:  ampicillin-sulbactam (UNASYN) IV 3 g (04/09/24 1833)    (feeding supplement) PROSource Plus  30 mL Oral BID BM   apixaban   5 mg Oral BID   aspirin   81 mg Oral Daily   atorvastatin   80 mg Oral QHS   cinacalcet   30 mg Oral Q T,Th,Sat-1800   doxercalciferol   6 mcg Intravenous Q T,Th,Sa-HD   feeding supplement (NEPRO CARB STEADY)  237 mL  Oral BID BM   hydrALAZINE   25 mg Oral TID   hydrocerin   Topical BID   levothyroxine  50 mcg Oral Q0600   lidocaine  (PF)  10 mL Intradermal Once   lisinopril   40 mg Oral QHS   multivitamin  1 tablet Oral QHS   sucroferric oxyhydroxide  1,000 mg Oral TID WC

## 2024-04-10 NOTE — Care Management Important Message (Signed)
 Important Message  Patient Details  Name: [name] MRN: [id number] Date of Birth: December 09, 1951   Important Message Given:  Yes - Medicare IM     [name] 04/10/2024, 3:30 PM

## 2024-04-11 ENCOUNTER — Inpatient Hospital Stay (HOSPITAL_COMMUNITY)

## 2024-04-11 DIAGNOSIS — N186 End stage renal disease: Secondary | ICD-10-CM | POA: Diagnosis not present

## 2024-04-11 DIAGNOSIS — R918 Other nonspecific abnormal finding of lung field: Secondary | ICD-10-CM | POA: Diagnosis not present

## 2024-04-11 DIAGNOSIS — Z87891 Personal history of nicotine dependence: Secondary | ICD-10-CM | POA: Diagnosis not present

## 2024-04-11 DIAGNOSIS — J90 Pleural effusion, not elsewhere classified: Secondary | ICD-10-CM | POA: Diagnosis not present

## 2024-04-11 DIAGNOSIS — Z992 Dependence on renal dialysis: Secondary | ICD-10-CM | POA: Diagnosis not present

## 2024-04-11 LAB — GLUCOSE, CAPILLARY: Glucose-Capillary: 110 mg/dL — ABNORMAL HIGH (ref 70–99)

## 2024-04-11 LAB — CBC
HCT: 35.3 % — ABNORMAL LOW (ref 39.0–52.0)
Hemoglobin: 10.9 g/dL — ABNORMAL LOW (ref 13.0–17.0)
MCH: 29.4 pg (ref 26.0–34.0)
MCHC: 30.9 g/dL (ref 30.0–36.0)
MCV: 95.1 fL (ref 80.0–100.0)
Platelets: 117 10*3/uL — ABNORMAL LOW (ref 150–400)
RBC: 3.71 MIL/uL — ABNORMAL LOW (ref 4.22–5.81)
RDW: 16.4 % — ABNORMAL HIGH (ref 11.5–15.5)
WBC: 6.1 10*3/uL (ref 4.0–10.5)
nRBC: 0 % (ref 0.0–0.2)

## 2024-04-11 LAB — BASIC METABOLIC PANEL WITH GFR
Anion gap: 11 (ref 5–15)
BUN: 46 mg/dL — ABNORMAL HIGH (ref 8–23)
CO2: 29 mmol/L (ref 22–32)
Calcium: 9.7 mg/dL (ref 8.9–10.3)
Chloride: 98 mmol/L (ref 98–111)
Creatinine, Ser: 8.53 mg/dL — ABNORMAL HIGH (ref 0.61–1.24)
GFR, Estimated: 6 mL/min — ABNORMAL LOW (ref >60)
Glucose, Bld: 96 mg/dL (ref 70–99)
Potassium: 4.7 mmol/L (ref 3.5–5.1)
Sodium: 138 mmol/L (ref 135–145)

## 2024-04-11 LAB — CYTOLOGY - NON PAP

## 2024-04-11 MED ORDER — ORAL CARE MOUTH RINSE: 15.0000 mL | OROMUCOSAL | Status: DC | PRN

## 2024-04-11 NOTE — Progress Notes (Signed)
 HD#5 Subjective:  Summary: [name] is a 73 year old male with past medical history of ESRD on hemodialysis, a fib on A/C, type 2 diabetes, and hypertension, who presented from skilled nursing facility-[facility] by EMS after he had a fall,  and found to have exudative pleural effusion.  Overnight Events: NOE. Examined at bedside this morning.  Doing well otherwise, he was dialyzed yesterday. This morning, chest tube with about 600 cc output.  Objective:  Vital signs in last 24 hours: Vitals:   04/10/24 1706 04/10/24 2247 04/11/24 0657 04/11/24 0820  BP: (!) 135/54 (!) 138/53 (!) 131/46 (!) 145/58  Pulse: (!) 54 64 (!) 55 66  Resp: 18 20 20 18   Temp: 98.3 F (36.8 C) (!) 97.5 F (36.4 C) 98.6 F (37 C) 98.2 F (36.8 C)  TempSrc:  Oral    SpO2:  92% 99% 92%  Weight:  73.8 kg    Height:       Supplemental O2: Room Air SpO2: 92 % O2 Flow Rate (L/min): 2 L/min   Physical Exam:  Sitting in chair, intermittently somnolent. Heart with regular rate and rhythm, no murmurs. Diminished breath sounds in bilateral lung base, right lung connected to chest tube. Lower extremities with bilateral dermatitis and chronic venous disease/lymphedema. L AVF access with palpable thrill  Filed Weights   04/09/24 1031 04/09/24 2109 04/10/24 2247  Weight: 71.9 kg 80.4 kg 73.8 kg     Intake/Output Summary (Last 24 hours) at 04/11/2024 0942 Last data filed at 04/11/2024 0800 Gross per 24 hour  Intake 457.33 ml  Output 600 ml  Net -142.67 ml    Net IO Since Admission: -2,072.67 mL [04/11/24 0942]  No results for input(s): "GLUCAP" in the last 72 hours.    Pertinent Labs:    Latest Ref Rng & Units 04/11/2024    5:31 AM 04/09/2024    4:52 AM 04/08/2024    9:22 PM  CBC  WBC 4.0 - 10.5 K/uL 6.1  7.3    Hemoglobin 13.0 - 17.0 g/dL 47.8  29.5    Hematocrit 39.0 - 52.0 % 35.3  36.9  38.3   Platelets 150 - 400 K/uL 117  148         Latest Ref Rng & Units 04/11/2024    5:31 AM 04/09/2024     7:58 AM 04/08/2024    4:57 AM  CMP  Glucose 70 - 99 mg/dL 96  73  79   BUN 8 - 23 mg/dL 46  48  33   Creatinine 0.61 - 1.24 mg/dL 6.21  3.08  6.57   Sodium 135 - 145 mmol/L 138  135  133   Potassium 3.5 - 5.1 mmol/L 4.7  4.3  4.2   Chloride 98 - 111 mmol/L 98  96  97   CO2 22 - 32 mmol/L 29  27  24    Calcium  8.9 - 10.3 mg/dL 9.7  9.5  9.1     Imaging: DG CHEST PORT 1 VIEW Result Date: 04/11/2024 CLINICAL DATA:  Follow-up chest tube EXAM: PORTABLE CHEST 1 VIEW COMPARISON:  Film from the previous day. FINDINGS: Cardiac shadow is enlarged. Stable pigtail catheter is noted in the right base. Improved aeration in the right base is noted. Persistent collection of air is noted along the lateral right lung base with associated small effusion. The overall size is smaller than that seen on the prior exam. Calcified pleural plaques are noted on the left. IMPRESSION: Improved aeration in the right base.  Persistent but decreased air collection along the lateral aspect of the right lung base. Electronically Signed   By: Violeta Grey M.D.   On: 04/11/2024 09:18     Assessment/Plan:   Principal Problem:   Pleural effusion Active Problems:   Stage 5 chronic kidney disease on chronic dialysis [facility])   Fall   Patient Summary: [name] is a 73 y.o. with a pertinent PMH of A-fib on A/C, type 2 diabetes, hypertension, ESRD on hemodialysis who presented from SNF after a fall, found to have right pleural effusion, S/p thoracentesis x2.   # Lymphocytic predominant exudative pleural effusion # Right lung mass vs atelectasis Chest tube output decreasing, still elevated.  Afebrile with no leukocytosis.  I anticipate will be able to discontinue chest tube  if output decreases to about <100- 200 cc.  PCCM following, appreciate recs.  Today will be last day for Unasyn  for presumed lung infection.  Plan:  - CBC - Monitor chest tube output.  # ESRD (Tue/Th/ Sat) # Normocytic anemia He had dialysis  yesterday, stable electrolytes, Hb stable.  -Monitor BMP.  Chronic medical conditions:   #Hypothyroidism -Levothyroxine  50 mcg.   #Hypertension - Lisinopril  40 mg. - Hydralazine  25 mg PRN  Diet: Renal diet IVF: PO intake VTE: apixaban  (ELIQUIS ) tablet 5 mg  Code: Full PT/OT: Pending ID:  Anti-infectives (From admission, onward)    Start     Dose/Rate Route Frequency Ordered Stop   04/06/24 1800  Ampicillin -Sulbactam (UNASYN ) 3 g in sodium chloride  0.9 % 100 mL IVPB        3 g 200 mL/hr over 30 Minutes Intravenous Every 24 hours 04/06/24 1444        Anticipated discharge to Skilled nursing facility pending medical stabilization.  Marni Sins, MD 04/11/2024, 9:42 AM Pager: [phone] [facility]

## 2024-04-11 NOTE — Progress Notes (Signed)
 M5 Nurse, [name],  called after we have received report that the patient refused to come to HD tonight. Stated, "I had dialysis twice this week.[name] not having dialysis tonight." Labs: Potassium 4.7; Hgb 10.9.  Nephrology made aware.

## 2024-04-11 NOTE — Progress Notes (Signed)
 Physical Therapy Treatment Patient Details Name: [name] MRN: [id number] DOB: 1951/02/04 Today's Date: 04/11/2024   History of Present Illness Mr. Kamran is a 73 year old male who presented to the ED 4/25 for evaluation after a fall.  Found to have a mild to moderate right pleural effusion. S/p Thoracentesis with IR 4/25 and R CT placed 4/29. PMhx: CKD on HD Tuesday/Thursday/Saturday; CHF, diabetes, CKD, HTN.    PT Comments  Pt received asleep in bed, easily aroused and alter once awake. Pt able to mobilize to EOB with increased time due to chest tube but no physical assist. Pt continues to c/o B LE pain and specific R knee pain. Pt needed min A to stand and min A to pivot to recliner. Encouraged pt to mobilize more but pt declined due to LE discomfort. Educated on there ex and importance of mobility for continued ability to walk. Patient will benefit from continued inpatient follow up therapy, <3 hours/day     If plan is discharge home, recommend the following: A little help with walking and/or transfers;Assistance with cooking/housework   Can travel by private vehicle     Yes  Equipment Recommendations  Rolling walker (2 wheels)    Recommendations for Other Services       Precautions / Restrictions Precautions Precautions: Fall Recall of Precautions/Restrictions: Intact Precaution/Restrictions Comments: fell PTA, BLE swelling, R knee OA Restrictions Weight Bearing Restrictions Per Provider Order: No     Mobility  Bed Mobility Overal bed mobility: Modified Independent Bed Mobility: Supine to Sit     Supine to sit: Modified independent (Device/Increase time)     General bed mobility comments: mod I to come to EOB, took increased time with care of R chest tube    Transfers Overall transfer level: Needs assistance Equipment used: 1 person hand held assist Transfers: Sit to/from Stand, Bed to chair/wheelchair/BSC Sit to Stand: Min assist Stand pivot transfers: Min  assist         General transfer comment: min HHA to step from bed to recliner. Encouraged pt to mobilize more but pt reports limitations due to discomfort in LE's    Ambulation/Gait                   Stairs             Wheelchair Mobility     Tilt Bed    Modified Rankin (Stroke Patients Only)       Balance Overall balance assessment: Needs assistance Sitting-balance support: Feet supported Sitting balance-Leahy Scale: Good     Standing balance support: Single extremity supported Standing balance-Leahy Scale: Poor Standing balance comment: needs UE support for safe standing, is not steady                            Communication Communication Communication: No apparent difficulties  Cognition Arousal: Lethargic Behavior During Therapy: WFL for tasks assessed/performed   PT - Cognitive impairments: No apparent impairments                         Following commands: Intact      Cueing Cueing Techniques: Verbal cues  Exercises General Exercises - Lower Extremity Ankle Circles/Pumps: AROM, Both, 15 reps, Seated    General Comments General comments (skin integrity, edema, etc.): pt assisted with set up for lunch. Pt now agreeable to go back to [facility] though hoping to be able to  go home soon after      Pertinent Vitals/Pain Pain Assessment Pain Assessment: Faces Faces Pain Scale: Hurts a little bit Pain Location: R knee Pain Descriptors / Indicators: Aching Pain Intervention(s): Limited activity within patient's tolerance, Monitored during session    Home Living                          Prior Function            PT Goals (current goals can now be found in the care plan section) Acute Rehab PT Goals Patient Stated Goal: Move out of [facility] PT Goal Formulation: With patient Time For Goal Achievement: 04/19/24 Potential to Achieve Goals: Good Progress towards PT goals: Progressing toward  goals    Frequency    Min 1X/week      PT Plan      Co-evaluation              AM-PAC PT "6 Clicks" Mobility   Outcome Measure  Help needed turning from your back to your side while in a flat bed without using bedrails?: None Help needed moving from lying on your back to sitting on the side of a flat bed without using bedrails?: A Little Help needed moving to and from a bed to a chair (including a wheelchair)?: A Little Help needed standing up from a chair using your arms (e.g., wheelchair or bedside chair)?: A Little Help needed to walk in hospital room?: A Lot Help needed climbing 3-5 steps with a railing? : Total 6 Click Score: 16    End of Session Equipment Utilized During Treatment: Gait belt Activity Tolerance: Patient tolerated treatment well Patient left: with call bell/phone within reach;in chair;with chair alarm set Nurse Communication: Mobility status PT Visit Diagnosis: Unsteadiness on feet (R26.81);Other abnormalities of gait and mobility (R26.89);Repeated falls (R29.6);Muscle weakness (generalized) (M62.81);History of falling (Z91.81)     Time: 9604-5409 PT Time Calculation (min) (ACUTE ONLY): 11 min  Charges:    $Therapeutic Activity: 8-22 mins PT General Charges $$ ACUTE PT VISIT: 1 Visit                     Amey Ka, PT  Acute Rehab Services Secure chat preferred Office [phone]    Deloris Fetters Kellie Chisolm 04/11/2024, 2:05 PM

## 2024-04-11 NOTE — Plan of Care (Signed)
  Problem: Health Behavior/Discharge Planning: Goal: Ability to manage health-related needs will improve Outcome: Progressing   Problem: Clinical Measurements: Goal: Ability to maintain clinical measurements within normal limits will improve Outcome: Progressing Goal: Will remain free from infection Outcome: Progressing Goal: Diagnostic test results will improve Outcome: Progressing Goal: Respiratory complications will improve Outcome: Progressing Goal: Cardiovascular complication will be avoided Outcome: Progressing   Problem: Activity: Goal: Risk for activity intolerance will decrease Outcome: Progressing   Problem: Nutrition: Goal: Adequate nutrition will be maintained Outcome: Progressing   Problem: Coping: Goal: Level of anxiety will decrease Outcome: Progressing   Problem: Elimination: Goal: Will not experience complications related to bowel motility Outcome: Progressing Goal: Will not experience complications related to urinary retention Outcome: Progressing   Problem: Pain Managment: Goal: General experience of comfort will improve and/or be controlled Outcome: Progressing   Problem: Safety: Goal: Ability to remain free from injury will improve Outcome: Progressing   Problem: Skin Integrity: Goal: Risk for impaired skin integrity will decrease Outcome: Progressing   Problem: Education: Goal: Knowledge of disease and its progression will improve Outcome: Progressing   Problem: Fluid Volume: Goal: Compliance with measures to maintain balanced fluid volume will improve Outcome: Progressing   Problem: Health Behavior/Discharge Planning: Goal: Ability to manage health-related needs will improve Outcome: Progressing   Problem: Nutritional: Goal: Ability to make healthy dietary choices will improve Outcome: Progressing   Problem: Clinical Measurements: Goal: Complications related to the disease process, condition or treatment will be avoided or  minimized Outcome: Progressing

## 2024-04-11 NOTE — Progress Notes (Signed)
 [facility] Progress Note   Subjective:   Seen in room, reports doing well. Denies SOB, CP, dizziness, nausea.   Objective Vitals:   04/10/24 1706 04/10/24 2247 04/11/24 0657 04/11/24 0820  BP: (!) 135/54 (!) 138/53 (!) 131/46 (!) 145/58  Pulse: (!) 54 64 (!) 55 66  Resp: 18 20 20 18   Temp: 98.3 F (36.8 C) (!) 97.5 F (36.4 C) 98.6 F (37 C) 98.2 F (36.8 C)  TempSrc:  Oral    SpO2:  92% 99% 92%  Weight:  73.8 kg    Height:       Physical Exam General: Alert male in NAD  Heart: RRR, no murmurs, rubs or gallops Lungs: Chest tube draining blood tinged fluid, no rhochi/rales Abdomen: Soft, non-distended, +BS Extremities: trace firm edema/venous stasis dermatitis Dialysis Access:  LUE AVF + t/b  Additional Objective Labs: Basic Metabolic Panel: Recent Labs  Lab 04/08/24 0457 04/09/24 0758 04/11/24 0531  NA 133* 135 138  K 4.2 4.3 4.7  CL 97* 96* 98  CO2 24 27 29   GLUCOSE 79 73 96  BUN 33* 48* 46*  CREATININE 7.55* 9.19* 8.53*  CALCIUM  9.1 9.5 9.7  PHOS 5.4* 5.4*  --    Liver Function Tests: Recent Labs  Lab 04/05/24 1753 04/08/24 0457 04/09/24 0758  AST 10*  --   --   ALT 8  --   --   ALKPHOS 35*  --   --   BILITOT 0.5  --   --   PROT 6.2*  --   --   ALBUMIN 2.9* 2.7* 2.8*   No results for input(s): "LIPASE", "AMYLASE" in the last 168 hours. CBC: Recent Labs  Lab 04/05/24 0849 04/06/24 0603 04/08/24 0457 04/08/24 2122 04/09/24 0452 04/11/24 0531  WBC 5.8 5.4 7.0  --  7.3 6.1  NEUTROABS  --   --  4.9  --   --   --   HGB 12.2* 10.8* 11.7*  --  11.6* 10.9*  HCT 40.2 34.6* 37.0* 38.3* 36.9* 35.3*  MCV 95.3 94.3 93.9  --  93.2 95.1  PLT 146* 118* 126*  --  148* 117*   Blood Culture    Component Value Date/Time   SDES FLUID 04/06/2024 1346   SPECREQUEST NONE 04/06/2024 1346   CULT  04/06/2024 1346    NO GROWTH 3 DAYS Performed at [facility], [address] [place], [place] [address]    REPTSTATUS 04/10/2024 FINAL  04/06/2024 1346    Cardiac Enzymes: No results for input(s): "CKTOTAL", "CKMB", "CKMBINDEX", "TROPONINI" in the last 168 hours. CBG: Recent Labs  Lab 04/05/24 2013  GLUCAP 117*   Iron Studies: No results for input(s): "IRON", "TIBC", "TRANSFERRIN", "FERRITIN" in the last 72 hours. @lablastinr3 @ Studies/Results: DG CHEST PORT 1 VIEW Result Date: 04/11/2024 CLINICAL DATA:  Follow-up chest tube EXAM: PORTABLE CHEST 1 VIEW COMPARISON:  Film from the previous day. FINDINGS: Cardiac shadow is enlarged. Stable pigtail catheter is noted in the right base. Improved aeration in the right base is noted. Persistent collection of air is noted along the lateral right lung base with associated small effusion. The overall size is smaller than that seen on the prior exam. Calcified pleural plaques are noted on the left. IMPRESSION: Improved aeration in the right base. Persistent but decreased air collection along the lateral aspect of the right lung base. Electronically Signed   By: Violeta Grey M.D.   On: 04/11/2024 09:18   DG CHEST PORT 1  VIEW Result Date: 04/10/2024 CLINICAL DATA:  [id number]. Right chest tube in place. Placed yesterday by radiology. EXAM: PORTABLE CHEST 1 VIEW COMPARISON:  Portable chest 04/08/2024, chest CT with contrast 04/07/2019 FINDINGS: 4:10 a.m. Small caliber pleural tube with a curved tip is noted extending horizontally within the base of the right chest with the tip in the medial lower thorax. There is at least a small right pleural effusion remaining. Some of this is probably loculated and does not appear to pool completely dependently. There is heterogeneous aeration in the right lower lung field, increased lucency where there was previously fluid opacification and could indicate pneumothorax or partial Reaeration of the right lower zone. If this is a pneumothorax there is no apical component or visible pleural line. The remaining lungs are generally clear. There is calcified pleural  plaquing along the dome of the left diaphragm. The mediastinum is stable with aortic atherosclerosis and tortuosity. There is mild cardiomegaly without evidence of CHF. No new osseous findings. IMPRESSION: 1. Small caliber right pleural tube with the tip in the medial lower thorax. 2. There is at least a small right pleural effusion remaining, some of which is probably loculated and does not appear to pool completely dependently. 3. Heterogeneous aeration in the right lower lung field, with increased lucency where there was previously fluid opacification and could indicate pneumothorax or partial Reaeration of the right lower zone. If this is a pneumothorax then there is no apical component or visible pleural line. 4. Calcified pleural plaquing along the dome of the left diaphragm. 5. Aortic atherosclerosis. 6. Mild cardiomegaly. Electronically Signed   By: Denman Fischer M.D.   On: 04/10/2024 06:26   IR IMAGE GUIDED FLUID DRAIN BY CATHETER Result Date: 04/09/2024 INDICATION: 73 year old male referred for chest tube placement EXAM: IMAGE GUIDED RIGHT CHEST TUBE PLACEMENT MEDICATIONS: 4 mg IV Zofran  ANESTHESIA/SEDATION: Fentanyl  25 mcg IV; Versed  0.5 mg IV Moderate Sedation Time:  10 minutes The patient was continuously monitored during the procedure by the interventional radiology nurse under my direct supervision. COMPLICATIONS: None PROCEDURE: The procedure, risks, benefits, and alternatives were explained to the patient/patient's family, who provided informed consent on the patient's behalf. Specific risks that were addressed included bleeding, infection, ongoing pneumothorax, need for further procedure/surgery, chance of hemorrhage, hemoptysis, cardiopulmonary collapse, death. Questions regarding the procedure were encouraged and answered. The patient understands and consents to the procedure. Patient was positioned in the right anterior oblique position on the IR table and scout image of the chest was  performed for planning purposes. The right mid axillary line at the level of the nipple was identified, and prepped and draped in the usual sterile fashion. The skin and subcutaneous tissues were generously infiltrated 1% lidocaine  for local anesthesia. Ultrasound was used to navigate access catheter into the right chest. A Yueh needle was then used to enter the pleural space with aspiration of fluid. The plastic Yueh catheter was advanced into the pleural space and an 035 guidewire was advanced the pleural space under fluoroscopy. Dilation of the skin tract was performed over the wire, and then modified Seldinger technique was used to place a 10 French pigtail catheter . Catheter was attached to water seal chamber and suction was applied confirming a operational chest tube. Retention suture was placed.  Sterile dressing was placed. Patient tolerated the procedure well and remained hemodynamically stable throughout. No complications were encountered and no significant blood loss was encounter IMPRESSION: Status post image guided right-sided thoracostomy tube placement. Signed, Marciano Settles.  Rexine Cater, RPVI [facility] Electronically Signed   By: Myrlene Asper D.O.   On: 04/09/2024 13:57   Medications:  ampicillin -sulbactam (UNASYN ) IV 3 g (04/10/24 1722)    (feeding supplement) PROSource Plus  30 mL Oral BID BM   apixaban   5 mg Oral BID   aspirin   81 mg Oral Daily   atorvastatin   80 mg Oral QHS   cinacalcet   30 mg Oral Q T,Th,Sat-1800   doxercalciferol   6 mcg Intravenous Q T,Th,Sa-HD   feeding supplement (NEPRO CARB STEADY)  237 mL Oral BID BM   hydrALAZINE   25 mg Oral TID   hydrocerin   Topical BID   levothyroxine   50 mcg Oral Q0600   lidocaine  (PF)  10 mL Intradermal Once   lisinopril   40 mg Oral QHS   multivitamin  1 tablet Oral QHS   sucroferric oxyhydroxide  1,000 mg Oral TID WC    Dialysis Orders: East, T,Th, S 4 hrs 180NRe  450/Autoflow 1.5 74.6 kg 2.0 K/ 2.0 Ca AVF - Heparin  3000 units IV initial bolus and 3000 units IV mid run - Hectorol  6 mcg IV three times per week - Sensipar  30 mg PO three times per week - Venofer  50 mg IV weekly  Assessment/Plan:  R pleural effusion- pulmonology consulting, CT-guided chest tube placement  4/29,  noted pleural fluid cytology negative. On unasyn .   Hypokalemia: resolved  ESRD -HD TTS on dialysis schedule   Hypertension/volume  - hypotensive when seen by EMS. BP stable now. Continue home meds. He seems to be losing wt, Will attempt to lower and optimize volume with HD. UF as tolerated.   Anemia  - HGB at 10.9.  follow labs, cytology negative so likely safe to resume ESA if needed.   Metabolic bone disease -corrected calcium  over 10 will hold VDRA, continue Sensipar , velphoro  binders.  Phosphorus controlled  Nutrition - Low albumin, 2.8, K+ stable. Can have regular diet with fluid restrictions, protein supplement     Ramona Burner, PA-C 04/11/2024, 10:58 AM  [facility] Pager: [phone]

## 2024-04-11 NOTE — Progress Notes (Signed)
 PT Note:  Pt's d/c plan is now to return to [facility]. Patient will benefit from continued inpatient follow up therapy, <3 hours/day. See last note 05/09/24 for current LOF.   Amey Ka, PT  Acute Rehab Services Secure chat preferred Office [phone]

## 2024-04-11 NOTE — Plan of Care (Signed)
   Problem: Health Behavior/Discharge Planning: Goal: Ability to manage health-related needs will improve Outcome: Progressing

## 2024-04-11 NOTE — Progress Notes (Signed)
 Patient refusing dialysis because he insists that he had a session yesterday and does not want to go two days in a row. This RN reminded the patient that his last session was on Tuesday so he is keeping with his normal schedule. Patient still under the impression that he had dialysis yesterday and continues to refuse.

## 2024-04-11 NOTE — Progress Notes (Signed)
   NAME:  [name], MRN:  [id number], DOB:  1951-07-02, LOS: 5 ADMISSION DATE:  04/05/2024, CONSULTATION DATE:  04/06/2024 REFERRING MD:  Kirt Pereyra MD, CHIEF COMPLAINT:   Pleural effusion, abnormal CT  History of Present Illness:   73 year old with history of end-stage renal disease on hemodialysis, hypothyroidism, hypertension, atrial fibrillation on Eliquis  Presenting after fall out of bed yesterday morning.  He fell on the right side and noted to have right-sided pleural effusion Underwent thoracentesis by IR yesterday with findings of bloody exudative effusion with lymphocyte predominance Follow-up CT shows possible lung mass and persistent moderate effusion and PCCM consulted for help with management.  He is a ex-smoker but quit at the age of 76.  Used to work in transportation.  May have had some exposure to asbestos in the past but cannot recall clearly Ongoing exposures.  Pertinent  Medical History    has a past medical history of Arthritis, Asthma, Chronic cystitis, Chronic diastolic heart failure (HCC), Chronic kidney disease, Diabetes mellitus, Dysrhythmia, GERD (gastroesophageal reflux disease), H/O hiatal hernia, Hyperlipidemia, Hypertension, Lymphedema, Morbid obesity (HCC), NEPHROLITHIASIS, HX OF (12/02/2009), PVD (peripheral vascular disease) (HCC), Renal insufficiency, UMBILICAL HERNIA (02/08/2007), and Venous insufficiency.   Significant Hospital Events: Including procedures, antibiotic start and stop dates in addition to other pertinent events   4/25 admit, right thoracentesis delayed due to anticoagulation 4/26 no issues overnight, awaiting IR thoracentesis with tentative plans for IR placed smallbore chest tube tomorrow 4/27 IR guided thoracentesis  Interim History / Subjective:  Continue to monitor chest tube drainage  Objective   Blood pressure (!) 145/58, pulse 66, temperature 98.2 F (36.8 C), resp. rate 18, height 5\' 10"  (1.778 m), weight 73.8 kg, SpO2  92%.        Intake/Output Summary (Last 24 hours) at 04/11/2024 1426 Last data filed at 04/11/2024 0800 Gross per 24 hour  Intake 457.33 ml  Output 600 ml  Net -142.67 ml   Filed Weights   04/09/24 1031 04/09/24 2109 04/10/24 2247  Weight: 71.9 kg 80.4 kg 73.8 kg    Examination: Laying flat comfortably on room air Breathing non labored no wheeze RRR Breath sounds diminished  No peripheral edema LUE AVF Flat affect, normal speech  Resolved Hospital Problem list     Assessment & Plan:  Right sided pleural effusion, exudative, lymphocyte predominant Possible right lung mass vs rounded atelectasis noted on CT Chest H/o former remote tobacco use ESRD on HD  CT Chest reviewed personally - chest tube in appropriate position, hydropneumothorax noted in right lung base.   The pleural fluid studies are not consistent with hemothorax. Suspect this is an effusion related to his ESRD. He does have some pneumothorax ex vacuo and I am uncertain how much of this will truly re-expand. May end up scarring off. Not a great candidate for thoracic surgery based on age and co-morbidities but could consider thoracic surgery consult. For now will continue to watch drainage and see if this can level off. Continue chest tube to suction.   Will follow.   Louie Rover, MD Pulmonary and Critical Care Medicine [facility] 04/11/2024 2:30 PM Pager: see AMION  If no response to pager, please call critical care on call (see AMION) until 7pm After 7:00 pm call Elink

## 2024-04-12 DIAGNOSIS — I4891 Unspecified atrial fibrillation: Secondary | ICD-10-CM

## 2024-04-12 DIAGNOSIS — N186 End stage renal disease: Secondary | ICD-10-CM | POA: Diagnosis not present

## 2024-04-12 DIAGNOSIS — D649 Anemia, unspecified: Secondary | ICD-10-CM | POA: Diagnosis not present

## 2024-04-12 DIAGNOSIS — J90 Pleural effusion, not elsewhere classified: Secondary | ICD-10-CM | POA: Diagnosis not present

## 2024-04-12 DIAGNOSIS — R918 Other nonspecific abnormal finding of lung field: Secondary | ICD-10-CM | POA: Diagnosis not present

## 2024-04-12 DIAGNOSIS — Z87891 Personal history of nicotine dependence: Secondary | ICD-10-CM | POA: Diagnosis not present

## 2024-04-12 DIAGNOSIS — Z992 Dependence on renal dialysis: Secondary | ICD-10-CM | POA: Diagnosis not present

## 2024-04-12 DIAGNOSIS — Z7901 Long term (current) use of anticoagulants: Secondary | ICD-10-CM

## 2024-04-12 LAB — CBC
HCT: 35.2 % — ABNORMAL LOW (ref 39.0–52.0)
Hemoglobin: 11.1 g/dL — ABNORMAL LOW (ref 13.0–17.0)
MCH: 29.3 pg (ref 26.0–34.0)
MCHC: 31.5 g/dL (ref 30.0–36.0)
MCV: 92.9 fL (ref 80.0–100.0)
Platelets: 131 10*3/uL — ABNORMAL LOW (ref 150–400)
RBC: 3.79 MIL/uL — ABNORMAL LOW (ref 4.22–5.81)
RDW: 16.4 % — ABNORMAL HIGH (ref 11.5–15.5)
WBC: 6.9 10*3/uL (ref 4.0–10.5)
nRBC: 0 % (ref 0.0–0.2)

## 2024-04-12 LAB — BASIC METABOLIC PANEL WITH GFR
Anion gap: 13 (ref 5–15)
BUN: 54 mg/dL — ABNORMAL HIGH (ref 8–23)
CO2: 26 mmol/L (ref 22–32)
Calcium: 9.5 mg/dL (ref 8.9–10.3)
Chloride: 98 mmol/L (ref 98–111)
Creatinine, Ser: 9.8 mg/dL — ABNORMAL HIGH (ref 0.61–1.24)
GFR, Estimated: 5 mL/min — ABNORMAL LOW (ref >60)
Glucose, Bld: 69 mg/dL — ABNORMAL LOW (ref 70–99)
Potassium: 5 mmol/L (ref 3.5–5.1)
Sodium: 137 mmol/L (ref 135–145)

## 2024-04-12 LAB — MRSA NEXT GEN BY PCR, NASAL: MRSA by PCR Next Gen: NOT DETECTED

## 2024-04-12 NOTE — Progress Notes (Signed)
   NAME:  Patrick TORCHIA, MRN:  [id number], DOB:  03-08-1951, LOS: 6 ADMISSION DATE:  04/05/2024, CONSULTATION DATE:  04/06/2024 REFERRING MD:  Kirt Pereyra MD, CHIEF COMPLAINT:   Pleural effusion, abnormal CT  History of Present Illness:   73 year old with history of end-stage renal disease on hemodialysis, hypothyroidism, hypertension, atrial fibrillation on Eliquis  Presenting after fall out of bed yesterday morning.  He fell on the right side and noted to have right-sided pleural effusion Underwent thoracentesis by IR yesterday with findings of bloody exudative effusion with lymphocyte predominance Follow-up CT shows possible lung mass and persistent moderate effusion and PCCM consulted for help with management.  He is a ex-smoker but quit at the age of 63.  Used to work in transportation.  May have had some exposure to asbestos in the past but cannot recall clearly Ongoing exposures.  Pertinent  Medical History    has a past medical history of Arthritis, Asthma, Chronic cystitis, Chronic diastolic heart failure (HCC), Chronic kidney disease, Diabetes mellitus, Dysrhythmia, GERD (gastroesophageal reflux disease), H/O hiatal hernia, Hyperlipidemia, Hypertension, Lymphedema, Morbid obesity (HCC), NEPHROLITHIASIS, HX OF (12/02/2009), PVD (peripheral vascular disease) (HCC), Renal insufficiency, UMBILICAL HERNIA (02/08/2007), and Venous insufficiency.   Significant Hospital Events: Including procedures, antibiotic start and stop dates in addition to other pertinent events   4/25 admit, right thoracentesis delayed due to anticoagulation 4/26 no issues overnight, awaiting IR thoracentesis with tentative plans for IR placed smallbore chest tube tomorrow 4/27 IR guided thoracentesis  Interim History / Subjective:  No overnight issues 500 out so far in chest tube  Objective   Blood pressure (!) 115/58, pulse (!) 58, temperature (!) 97.5 F (36.4 C), temperature source Oral, resp. rate 18,  height 5\' 10"  (1.778 m), weight 73.8 kg, SpO2 97%.        Intake/Output Summary (Last 24 hours) at 04/12/2024 1438 Last data filed at 04/12/2024 4098 Gross per 24 hour  Intake 100 ml  Output 490 ml  Net -390 ml   Filed Weights   04/09/24 1031 04/09/24 2109 04/10/24 2247  Weight: 71.9 kg 80.4 kg 73.8 kg    Examination: Laying in bed, resting comfortably Breathing non labored Room air Right chest tube in place, serosanguinous drainage, no air leak  Resolved Hospital Problem list     Assessment & Plan:  Right sided pleural effusion, exudative, lymphocyte predominant Possible right lung mass vs rounded atelectasis noted on CT Chest H/o former remote tobacco use ESRD on HD  Maintain chest tube to LWS Will resend pleural fluid studies Will talk to thoracic surgery to see if candidate for pleuroscopy, biopsy or vats decortication.  Will follow.   Louie Rover, MD Pulmonary and Critical Care Medicine [facility] 04/12/2024 2:38 PM Pager: see AMION  If no response to pager, please call critical care on call (see AMION) until 7pm After 7:00 pm call Elink

## 2024-04-12 NOTE — Progress Notes (Signed)
 Mobility Specialist Progress Note:    04/12/24 1100  Mobility  Activity Refused mobility  Mobility Specialist Start Time (ACUTE ONLY) 1110   Pt refused mobility of any kind. No reason specified, stated "he would get up tomorrow". Will f/u as able.    D'Vante Nolon Baxter Mobility Specialist Please contact via Special educational needs teacher or Rehab office at [phone]

## 2024-04-12 NOTE — Progress Notes (Signed)
 [facility] Progress Note   Subjective:   Pt declined dialysis yesterday, seems he was a bit confused about his schedule. Fortunately labs stable today and he denies SOB. Wants to wait until tomorrow for next HD. Having some irritation around chest tube. Denies dizziness, nausea, vomiting.   Objective Vitals:   04/11/24 1641 04/11/24 2059 04/12/24 0529 04/12/24 0800  BP: (!) 139/56 (!) 123/54 (!) 140/57 (!) 115/58  Pulse: 66 61 (!) 59 (!) 58  Resp:  14 18   Temp: 99.5 F (37.5 C) 97.9 F (36.6 C) 97.7 F (36.5 C) (!) 97.5 F (36.4 C)  TempSrc: Oral Oral Oral Oral  SpO2: 90% 100% 100% 97%  Weight:      Height:       Physical Exam General: Alert male in NAD  Heart: RRR, no murmurs, rubs or gallops Lungs: Chest tube draining blood tinged fluid, no rhochi/rales Abdomen: Soft, non-distended, +BS Extremities: trace firm edema/venous stasis dermatitis Dialysis Access:  LUE AVF + t/b  Additional Objective Labs: Basic Metabolic Panel: Recent Labs  Lab 04/08/24 0457 04/09/24 0758 04/11/24 0531 04/12/24 0619  NA 133* 135 138 137  K 4.2 4.3 4.7 5.0  CL 97* 96* 98 98  CO2 24 27 29 26   GLUCOSE 79 73 96 69*  BUN 33* 48* 46* 54*  CREATININE 7.55* 9.19* 8.53* 9.80*  CALCIUM  9.1 9.5 9.7 9.5  PHOS 5.4* 5.4*  --   --    Liver Function Tests: Recent Labs  Lab 04/05/24 1753 04/08/24 0457 04/09/24 0758  AST 10*  --   --   ALT 8  --   --   ALKPHOS 35*  --   --   BILITOT 0.5  --   --   PROT 6.2*  --   --   ALBUMIN 2.9* 2.7* 2.8*   No results for input(s): "LIPASE", "AMYLASE" in the last 168 hours. CBC: Recent Labs  Lab 04/06/24 0603 04/08/24 0457 04/08/24 2122 04/09/24 0452 04/11/24 0531 04/12/24 0619  WBC 5.4 7.0  --  7.3 6.1 6.9  NEUTROABS  --  4.9  --   --   --   --   HGB 10.8* 11.7*  --  11.6* 10.9* 11.1*  HCT 34.6* 37.0*   < > 36.9* 35.3* 35.2*  MCV 94.3 93.9  --  93.2 95.1 92.9  PLT 118* 126*  --  148* 117* 131*   < > = values in this  interval not displayed.   Blood Culture    Component Value Date/Time   SDES FLUID 04/06/2024 1346   SPECREQUEST NONE 04/06/2024 1346   CULT  04/06/2024 1346    NO GROWTH 3 DAYS Performed at [facility], [address], [place], [place] [address]    REPTSTATUS 04/10/2024 FINAL 04/06/2024 1346    Cardiac Enzymes: No results for input(s): "CKTOTAL", "CKMB", "CKMBINDEX", "TROPONINI" in the last 168 hours. CBG: Recent Labs  Lab 04/05/24 2013 04/11/24 1146  GLUCAP 117* 110*   Iron Studies: No results for input(s): "IRON", "TIBC", "TRANSFERRIN", "FERRITIN" in the last 72 hours. @lablastinr3 @ Studies/Results: CT CHEST WO CONTRAST Result Date: 04/11/2024 CLINICAL DATA:  Empyema EXAM: CT CHEST WITHOUT CONTRAST TECHNIQUE: Multidetector CT imaging of the chest was performed following the standard protocol without IV contrast. RADIATION DOSE REDUCTION: This exam was performed according to the departmental dose-optimization program which includes automated exposure control, adjustment of the mA and/or kV according to patient size and/or use of iterative reconstruction technique. COMPARISON:  04/06/2024 FINDINGS: Cardiovascular: Limited unenhanced imaging of the heart demonstrates mild cardiomegaly without pericardial effusion. Normal caliber of the thoracic aorta. Stable atherosclerosis of the aorta and coronary vasculature. Assessment of the vascular lumen cannot be performed without intravenous contrast. Mediastinum/Nodes: No enlarged mediastinal or axillary lymph nodes. Thyroid  gland, trachea, and esophagus demonstrate no significant findings. Lungs/Pleura: There is a tunneled right pleural catheter along the right hemidiaphragm. Interval decrease in the fluid component of the right empyema noted previously, with increased gas lucency likely due to indwelling drain and recent procedure. Dense consolidation within the right middle and right lower lobe unchanged. Calcified left pleural plaque  again noted. Minimal left basilar atelectasis. No left pneumothorax. Central airways are patent. Upper Abdomen: No acute abnormality. Musculoskeletal: No acute or destructive bony abnormalities. Reconstructed images demonstrate no additional findings. IMPRESSION: 1. Interval placement of a tunneled right pleural drain, with decreased fluid component of the reported empyema noted on prior study. Increased gas within the right pleural space likely due to recent placement of the pleural drain. 2. Continued masslike consolidation within the right middle and right lower lobe, stable. 3. Stable calcified left pleural plaque with minimal left basilar atelectasis. 4. Aortic Atherosclerosis (ICD10-I70.0). Coronary artery atherosclerosis. Electronically Signed   By: Bobbye Burrow M.D.   On: 04/11/2024 17:39   DG CHEST PORT 1 VIEW Result Date: 04/11/2024 CLINICAL DATA:  Follow-up chest tube EXAM: PORTABLE CHEST 1 VIEW COMPARISON:  Film from the previous day. FINDINGS: Cardiac shadow is enlarged. Stable pigtail catheter is noted in the right base. Improved aeration in the right base is noted. Persistent collection of air is noted along the lateral right lung base with associated small effusion. The overall size is smaller than that seen on the prior exam. Calcified pleural plaques are noted on the left. IMPRESSION: Improved aeration in the right base. Persistent but decreased air collection along the lateral aspect of the right lung base. Electronically Signed   By: Violeta Grey M.D.   On: 04/11/2024 09:18   Medications:  ampicillin -sulbactam (UNASYN ) IV 3 g (04/11/24 1719)    (feeding supplement) PROSource Plus  30 mL Oral BID BM   apixaban   5 mg Oral BID   aspirin   81 mg Oral Daily   atorvastatin   80 mg Oral QHS   cinacalcet   30 mg Oral Q T,Th,Sat-1800   doxercalciferol   6 mcg Intravenous Q T,Th,Sa-HD   feeding supplement (NEPRO CARB STEADY)  237 mL Oral BID BM   hydrALAZINE   25 mg Oral TID   hydrocerin    Topical BID   levothyroxine   50 mcg Oral Q0600   lidocaine  (PF)  10 mL Intradermal Once   lisinopril   40 mg Oral QHS   multivitamin  1 tablet Oral QHS   sucroferric oxyhydroxide  1,000 mg Oral TID WC    Dialysis Orders: East, T,Th, S 4 hrs 180NRe 450/Autoflow 1.5 74.6 kg 2.0 K/ 2.0 Ca AVF - Heparin  3000 units IV initial bolus and 3000 units IV mid run - Hectorol  6 mcg IV three times per week - Sensipar  30 mg PO three times per week - Venofer  50 mg IV weekly  Assessment/Plan:  R pleural effusion- pulmonology consulting, CT-guided chest tube placement  4/29,  noted pleural fluid cytology negative. On unasyn .   Hypokalemia: resolved  ESRD -HD TTS on dialysis schedule, refused Thursday. Next HD Saturday  Hypertension/volume  - hypotensive when seen by EMS. BP stable now. Continue home meds. He seems to be losing wt, Will attempt  to lower and optimize volume with HD. UF as tolerated.   Anemia  - HGB at 11.1.  follow labs, cytology negative so likely safe to resume ESA if needed.   Metabolic bone disease -corrected calcium  over 10 will hold VDRA, continue Sensipar , velphoro  binders.  Phosphorus controlled  Nutrition - Low albumin, 2.8, K+ stable. Can have regular diet with fluid restrictions, protein supplement   Ramona Burner, PA-C 04/12/2024, 10:51 AM  [facility] Pager: [phone]

## 2024-04-12 NOTE — Consult Note (Cosign Needed)
 [address]       [place] [address]             [phone]        [name] [name] #[id number] Date of Birth: May 26, 1951  Referring: [name] Primary Care: [name]., [name] Primary Cardiologist:James Lavonne Prairie, MD  Chief Complaint:    Chief Complaint  Patient presents with   Fall   History of Present Illness:      [name] is a 73 yo AA male brought in via EMS to the ED on 4/25 from [facility] after being found on the floor with suspected unwitnessed fall out of bed.  He has known history of ESRD ( T/TH/Sat), HLD, HTN, MGUS, Type 2 DM, CHF, Diabetic Retinopathy, Anemia of Chronic Disease, and Atrial Fibrillation on Eliquis .  Workup in the ED the patient denied weakness, lose of consciousness, fevers, chills, but he was suffering from a mild headache.  Chest xray obtained showed a new pleural effusion on the right.  Head and neck CT were negative for acute injury.  It was felt admission was warranted and he was admitted the medicine service.  He underwent Thoracentesis by IR with removal of some bloody fluid.  Cytology was obtained and showed inflammatory cells but no confirmed malignancy.  CT of chest was obtained and showed dependent right lower lobe collapse/consolidation, subpleural right middle lobe mass like opacity measuring 4.8 x 3.5 cm, and small right pleural effusion.  Pulmonology was consulted who recommended chest tube placement with repeat cytology of fluid.  They recommended initiation of possible antibiotics for potential infection.  Bronchoscopy was placed on hold initially.  Repeat Thoracentesis was repeated on 4/27 with removal of 625 cc of fluid.  IR placed chest tube on 4/29 with significant output.  Repeat CT scan was obtained on 5/1 which showed increase gas in the right space felt to be related to chest tube placement.  There was continued mass-like consolidation within the right middle and right  lower lobe.  There was concern this could be a malignancy vs. Trapped lung.  Thoracic surgery consultation has been requested.  Currently the patient is resting comfortably in bed.  He confirmed presentation complaint.  He denies chest pain, shortness of breath, dizziness, fatigue.  He is currently wheel chair bound in a nursing home.  Somewhat of a poor historian, stating he doesn't know if he is diabetic, has Atrial Fibrillation, or HLD.  He is a former smoker having quit 30 years ago.  Past Medical History:  Diagnosis Date   Arthritis    Asthma    as a child   Chronic cystitis    Chronic diastolic heart failure (HCC)    Chronic kidney disease    HD pt, 3 times a week.   Diabetes mellitus    Dysrhythmia    GERD (gastroesophageal reflux disease)    H/O hiatal hernia    states it's been fixed   Hyperlipidemia    Hypertension    Lymphedema    Morbid obesity (HCC)    NEPHROLITHIASIS, HX OF 12/02/2009   Qualifier: Diagnosis of  By: [name], Cat     PVD (peripheral vascular disease) (HCC)    Renal insufficiency    UMBILICAL HERNIA 02/08/2007   Qualifier: History of  By: Peggy Bowens MD, Shane     Venous insufficiency     Past Surgical History:  Procedure Laterality Date   A/V  FISTULAGRAM N/A 02/02/2024   Procedure: A/V Fistulagram;  Surgeon: Baron Border, MD;  Location: [facility] INVASIVE CV LAB;  Service: Cardiovascular;  Laterality: N/A;   ABDOMINAL AORTOGRAM W/LOWER EXTREMITY N/A 05/19/2017   Procedure: Abdominal Aortogram w/Lower Extremity;  Surgeon: Richrd Char, MD;  Location: [facility] INVASIVE CV LAB;  Service: Cardiovascular;  Laterality: N/A;   ABDOMINAL AORTOGRAM W/LOWER EXTREMITY N/A 06/18/2021   Procedure: ABDOMINAL AORTOGRAM W/LOWER EXTREMITY;  Surgeon: Richrd Char, MD;  Location: [facility] INVASIVE CV LAB;  Service: Cardiovascular;  Laterality: N/A;   ABDOMINAL AORTOGRAM W/LOWER EXTREMITY N/A 04/17/2023   Procedure: ABDOMINAL AORTOGRAM W/LOWER EXTREMITY;  Surgeon: Adine Hoof, MD;  Location: [facility] INVASIVE CV LAB;  Service: Cardiovascular;  Laterality: N/A;   ABDOMINAL AORTOGRAM W/LOWER EXTREMITY N/A 05/22/2023   Procedure: ABDOMINAL AORTOGRAM W/LOWER EXTREMITY;  Surgeon: Adine Hoof, MD;  Location: [facility] INVASIVE CV LAB;  Service: Cardiovascular;  Laterality: N/A;   AMPUTATION     Right and left fifth toes.    AMPUTATION Left 06/18/2021   Procedure: Amputation transmetatarsal of toes 2 3 and 4 left foot ;  Surgeon: Richrd Char, MD;  Location: [facility] OR;  Service: Vascular;  Laterality: Left;   AMPUTATION TOE     emoval of both little toes   AV FISTULA PLACEMENT Left 03/21/2014   Procedure: ARTERIOVENOUS (AV) FISTULA CREATION with ultrasound;  Surgeon: Mayo Speck, MD;  Location: [facility] OR;  Service: Vascular;  Laterality: Left;   COLONOSCOPY     EYE SURGERY Bilateral    cataract and lens implant   FEMORAL-TIBIAL BYPASS GRAFT Left 06/18/2021   Procedure: Left below-knee popliteal to posterior tibial artery bypass with 9 reversed left greater saphenous vein;  Surgeon: Richrd Char, MD;  Location: [facility] OR;  Service: Vascular;  Laterality: Left;   HERNIA REPAIR     IR IMAGE GUIDED FLUID DRAIN BY CATHETER  04/09/2024   IR THORACENTESIS ASP PLEURAL SPACE W/IMG GUIDE  04/05/2024   LIGATION OF COMPETING BRANCHES OF ARTERIOVENOUS FISTULA Left 06/29/2015   Procedure: LIGATION OF LEFT ARM RADIOCEPHALIC ARTERIOVENOUS FISTULA SIDE BRANCHES;  Surgeon: Arvil Lauber, MD;  Location: [facility] OR;  Service: Vascular;  Laterality: Left;   MULTIPLE EXTRACTIONS WITH ALVEOLOPLASTY N/A 04/07/2017   Procedure: Extraction of tooth #'s 1-11, 13, 14,16, 20-23, and 26-28 with alveoloplasty;  Surgeon: Carol Chroman, DDS;  Location: [facility] OR;  Service: Oral Surgery;  Laterality: N/A;   PERIPHERAL VASCULAR INTERVENTION Right 04/17/2023   Procedure: PERIPHERAL VASCULAR INTERVENTION;  Surgeon: Adine Hoof, MD;  Location: [facility] INVASIVE CV LAB;  Service: Cardiovascular;   Laterality: Right;  Right SFA Stent   PERIPHERAL VASCULAR INTERVENTION Left 05/22/2023   Procedure: PERIPHERAL VASCULAR INTERVENTION;  Surgeon: Adine Hoof, MD;  Location: [facility] INVASIVE CV LAB;  Service: Cardiovascular;  Laterality: Left;  SFA   Popliteal to posterior tibial bypass     2006   R knee arthoscopic repair of meniscus     UMBILICAL HERNIA REPAIR      Social History   Tobacco Use  Smoking Status Former   Current packs/day: 0.00   Types: Cigarettes   Quit date: 06/06/1979   Years since quitting: 44.8  Smokeless Tobacco Former    Social History   Substance and Sexual Activity  Alcohol Use No   Alcohol/week: 0.0 standard drinks of alcohol   Comment:   "years ago" , none now     Allergies  Allergen Reactions   Shellfish Allergy Swelling  Shrimp (Diagnostic) Swelling    Current Facility-Administered Medications  Medication Dose Route Frequency Provider Last Rate Last Admin   (feeding supplement) PROSource Plus liquid 30 mL  30 mL Oral BID BM Brown, Rita Harbison, NP   30 mL at 04/10/24 0831   acetaminophen  (TYLENOL ) tablet 650 mg  650 mg Oral Q6H PRN Nooruddin, Saad, MD   650 mg at 04/10/24 1249   apixaban  (ELIQUIS ) tablet 5 mg  5 mg Oral BID Nooruddin, Saad, MD   5 mg at 04/12/24 4098   aspirin  chewable tablet 81 mg  81 mg Oral Daily Nooruddin, Saad, MD   81 mg at 04/12/24 1191   atorvastatin  (LIPITOR ) tablet 80 mg  80 mg Oral QHS Nooruddin, Saad, MD   80 mg at 04/10/24 2113   cinacalcet  (SENSIPAR ) tablet 30 mg  30 mg Oral Q T,Th,Sat-1800 Peeples, Samuel J, MD   30 mg at 04/11/24 1713   doxercalciferol  (HECTOROL ) injection 6 mcg  6 mcg Intravenous Q T,Th,Sa-HD Levorn Reason, MD   6 mcg at 04/09/24 0754   feeding supplement (NEPRO CARB STEADY) liquid 237 mL  237 mL Oral BID BM Raliegh Burgess, NP   237 mL at 04/10/24 0831   hydrALAZINE  (APRESOLINE ) tablet 25 mg  25 mg Oral TID Nooruddin, Saad, MD   25 mg at 04/12/24 4782   hydrocerin (EUCERIN)  cream   Topical BID Raliegh Burgess, NP   Given at 04/12/24 9562   HYDROmorphone  (DILAUDID ) tablet 1 mg  1 mg Oral Q6H PRN Koomson, Julius, MD   1 mg at 04/10/24 1936   levothyroxine  (SYNTHROID ) tablet 50 mcg  50 mcg Oral Q0600 Priscella Brooms, DO   50 mcg at 04/12/24 0600   lidocaine  (PF) (XYLOCAINE ) 1 % injection 10 mL  10 mL Intradermal Once Carim, Charles A, PA-C       lisinopril  (ZESTRIL ) tablet 40 mg  40 mg Oral QHS Nooruddin, Saad, MD   40 mg at 04/11/24 2133   multivitamin (RENA-VIT) tablet 1 tablet  1 tablet Oral QHS Brown, Rita Harbison, NP   1 tablet at 04/11/24 2133   Oral care mouth rinse  15 mL Mouth Rinse PRN Sandie Cross, MD       sucroferric oxyhydroxide (VELPHORO ) chewable tablet 1,000 mg  1,000 mg Oral TID WC Nooruddin, Saad, MD   1,000 mg at 04/12/24 1308    Facility-Administered Medications Prior to Admission  Medication Dose Route Frequency Provider Last Rate Last Admin   0.9 %  sodium chloride  infusion  250 mL Intravenous PRN Adine Hoof, MD       0.9 %  sodium chloride  infusion  250 mL Intravenous PRN Adine Hoof, MD       0.9 %  sodium chloride  infusion  250 mL Intravenous PRN Adine Hoof, MD       sodium chloride  flush (NS) 0.9 % injection 3 mL  3 mL Intravenous Q12H Adine Hoof, MD       sodium chloride  flush (NS) 0.9 % injection 3 mL  3 mL Intravenous Q12H Adine Hoof, MD       sodium chloride  flush (NS) 0.9 % injection 3 mL  3 mL Intravenous Q12H Adine Hoof, MD       Medications Prior to Admission  Medication Sig Dispense Refill Last Dose/Taking   acetaminophen  (TYLENOL ) 325 MG tablet Take 650 mg by mouth every 6 (six) hours as needed for mild pain (pain score 1-3)  or fever.   05/30/2023   apixaban  (ELIQUIS ) 5 MG TABS tablet Take 1 tablet (5 mg total) by mouth 2 (two) times daily. 60 tablet  04/04/2024 at  9:25 PM   aspirin  81 MG chewable tablet Chew 81 mg by mouth daily.    04/04/2024   atorvastatin  (LIPITOR ) 80 MG tablet Take 80 mg by mouth at bedtime.   04/04/2024   docusate sodium  (COLACE) 100 MG capsule Take 100 mg by mouth daily.   04/04/2024   glucagon (GLUCAGEN) 1 MG SOLR injection Inject 1 mg into the vein daily as needed for low blood sugar.   01/10/2022   guaiFENesin  (ROBITUSSIN) 100 MG/5ML liquid Take 15 mLs by mouth every 4 (four) hours as needed for cough or to loosen phlegm.   Unknown   hydrALAZINE  (APRESOLINE ) 25 MG tablet Take 25 mg by mouth in the morning and at bedtime.   04/04/2024   levothyroxine  (SYNTHROID ) 50 MCG tablet Take 50 mcg by mouth daily before breakfast.   04/04/2024   lisinopril  (ZESTRIL ) 20 MG tablet Take 20 mg by mouth See admin instructions. Take 20 mg by mouth once daily on Monday, Wednesday, Friday, and Sunday.   04/03/2024   lisinopril  (ZESTRIL ) 40 MG tablet Take 40 mg by mouth at bedtime.   04/04/2024   metoprolol  succinate (TOPROL -XL) 25 MG 24 hr tablet Take 1 tablet (25 mg total) by mouth daily.   04/04/2024   Multiple Vitamins-Minerals (MULTIVITAMIN WITH MINERALS) tablet Take 1 tablet by mouth daily in the afternoon.   04/04/2024   sucroferric oxyhydroxide (VELPHORO ) 500 MG chewable tablet Chew 1,000 mg by mouth 3 (three) times daily with meals.   04/04/2024    Family History  Problem Relation Age of Onset   Diabetes Mother    Heart disease Mother    Diabetes Brother    Review of Systems:   ROS     Cardiac Review of Systems: Y or  [    ]= no  Chest Pain [ N   ]  Resting SOB Discordia.Diesel   ] Exertional SOB  [N  ]  Orthopnea [  ]   Pedal Edema [ N  ]    Palpitations [ N ] Syncope  [  ]   Presyncope [   ]  General Review of Systems: [Y] = yes [  ]=no Constitional: recent weight change [  ]; anorexia [  ]; fatigue Discordia.Diesel  ]; nausea [  ]; night sweats [  ]; fever [  ]; or chills [  ]                                                               Dental: Last Dentist visit:   Eye : blurred vision [  ]; diplopia [   ]; vision changes [  ];   Amaurosis fugax[  ]; Resp: cough N ];  wheezing[  ];  hemoptysis[  ]; shortness of breath[ N ]; paroxysmal nocturnal dyspnea[  ]; dyspnea on exertion[  ]; or orthopnea[  ];  GI:  gallstones[  ], vomiting[  ];  dysphagia[  ]; melena[  ];  hematochezia [  ]; heartburn[  ];   Hx of  Colonoscopy[  ]; GU: kidney stones [  ]; hematuria[  ];  dysuria [  ];  nocturia[  ];  history of     obstruction [  ]; urinary frequency [  ]             Skin: rash, swelling[  ];, hair loss[  ];  peripheral edema[  ];  or itching[  ]; Musculosketetal: myalgias[  ];  joint swelling[  ];  joint erythema[  ];  joint pain[  ];  back pain[  ];  Heme/Lymph: bruising[  ];  bleeding[  ];  anemia[  ];  Neuro: TIA[  ];  headaches[  ];  stroke[ N ];  vertigo[  ];  seizures[  ];   paresthesias[  ];  difficulty walking[  ];  Psych:depression[  ]; anxiety[  ];  Endocrine: diabetes[ Y ];  thyroid  dysfunction[  ];  Physical Exam: BP (!) 115/58 (BP Location: Right Arm)   Pulse (!) 58   Temp (!) 97.5 F (36.4 C) (Oral)   Resp 18   Ht 5\' 10"  (1.778 m)   Wt 73.8 kg   SpO2 97%   BMI 23.34 kg/m   General appearance: alert, cooperative, and no distress Head: Normocephalic, without obvious abnormality, atraumatic Resp: diminished on right Cardio: regular rate and rhythm GI: soft, non-tender; bowel sounds normal; no masses,  no organomegaly Extremities: extremities normal, atraumatic, no cyanosis or edema Neurologic: Grossly normal  Diagnostic Studies & Laboratory data:     Recent Radiology Findings:   CT CHEST WO CONTRAST Result Date: 04/11/2024 CLINICAL DATA:  Empyema EXAM: CT CHEST WITHOUT CONTRAST TECHNIQUE: Multidetector CT imaging of the chest was performed following the standard protocol without IV contrast. RADIATION DOSE REDUCTION: This exam was performed according to the departmental dose-optimization program which includes automated exposure control, adjustment of the mA and/or kV according to patient size and/or use  of iterative reconstruction technique. COMPARISON:  04/06/2024 FINDINGS: Cardiovascular: Limited unenhanced imaging of the heart demonstrates mild cardiomegaly without pericardial effusion. Normal caliber of the thoracic aorta. Stable atherosclerosis of the aorta and coronary vasculature. Assessment of the vascular lumen cannot be performed without intravenous contrast. Mediastinum/Nodes: No enlarged mediastinal or axillary lymph nodes. Thyroid  gland, trachea, and esophagus demonstrate no significant findings. Lungs/Pleura: There is a tunneled right pleural catheter along the right hemidiaphragm. Interval decrease in the fluid component of the right empyema noted previously, with increased gas lucency likely due to indwelling drain and recent procedure. Dense consolidation within the right middle and right lower lobe unchanged. Calcified left pleural plaque again noted. Minimal left basilar atelectasis. No left pneumothorax. Central airways are patent. Upper Abdomen: No acute abnormality. Musculoskeletal: No acute or destructive bony abnormalities. Reconstructed images demonstrate no additional findings. IMPRESSION: 1. Interval placement of a tunneled right pleural drain, with decreased fluid component of the reported empyema noted on prior study. Increased gas within the right pleural space likely due to recent placement of the pleural drain. 2. Continued masslike consolidation within the right middle and right lower lobe, stable. 3. Stable calcified left pleural plaque with minimal left basilar atelectasis. 4. Aortic Atherosclerosis (ICD10-I70.0). Coronary artery atherosclerosis. Electronically Signed   By: Bobbye Burrow M.D.   On: 04/11/2024 17:39   DG CHEST PORT 1 VIEW Result Date: 04/11/2024 CLINICAL DATA:  Follow-up chest tube EXAM: PORTABLE CHEST 1 VIEW COMPARISON:  Film from the previous day. FINDINGS: Cardiac shadow is enlarged. Stable pigtail catheter is noted in the right base. Improved aeration in the  right base is noted. Persistent collection of air is noted along the  lateral right lung base with associated small effusion. The overall size is smaller than that seen on the prior exam. Calcified pleural plaques are noted on the left. IMPRESSION: Improved aeration in the right base. Persistent but decreased air collection along the lateral aspect of the right lung base. Electronically Signed   By: Violeta Grey M.D.   On: 04/11/2024 09:18     I have independently reviewed the above radiologic studies and discussed with the patient   Recent Lab Findings: Lab Results  Component Value Date   WBC 6.9 04/12/2024   HGB 11.1 (L) 04/12/2024   HCT 35.2 (L) 04/12/2024   PLT 131 (L) 04/12/2024   GLUCOSE 69 (L) 04/12/2024   CHOL 96 06/19/2021   TRIG 105 06/19/2021   HDL 19 (L) 06/19/2021   LDLCALC 56 06/19/2021   ALT 8 04/05/2024   AST 10 (L) 04/05/2024   NA 137 04/12/2024   K 5.0 04/12/2024   CL 98 04/12/2024   CREATININE 9.80 (H) 04/12/2024   BUN 54 (H) 04/12/2024   CO2 26 04/12/2024   TSH 2.926 04/05/2024   INR 1.00 07/10/2018   HGBA1C 4.9 06/15/2021   Assessment / Plan:      Patient admitted for an unwitnessed fall from bed at nursing home.  Workup revealed pleural effusion on CXR... Thoracentesis x 2, now with chest tube in place without air leak... this has drained >1000 cc since placement... More concerning is on CT scan there is a sub pleural 4.8 x 3.5 right middle lobe mass like opacity.  Pulmonary requested consult concerning possible trapped lung vs. Malignancy.  Patient with multiple co-morbidities and overall would prefer least invasive option possible.  It does not appear bronchoscopy has been performed.  Also could consider IR biopsy of mass.    Will discuss with Dr. Sherene Dilling who will ultimately follow up with recommendations.  I  spent 30 minutes counseling the patient face to face.   Alexy Bringle, PA-C 04/12/2024 2:26 PM

## 2024-04-12 NOTE — Progress Notes (Signed)
 HD#7 Subjective:  Summary: [name] is a 73 year old male with past medical history of ESRD on hemodialysis, a fib on A/C, type 2 diabetes, and hypertension, who presented from skilled nursing facility-[facility] by EMS after he had a fall,  and found to have exudative pleural effusion.  Overnight Events: NOE. Examined at bedside.  He declined dialysis yesterday, as he is of the impression he got dialyzed on Wednesday.    Chest tube still having a lot of output with about 560cc yesterday.   Objective:  Vital signs in last 24 hours: Vitals:   04/11/24 1641 04/11/24 2059 04/12/24 0529 04/12/24 0800  BP: (!) 139/56 (!) 123/54 (!) 140/57 (!) 115/58  Pulse: 66 61 (!) 59 (!) 58  Resp:  14 18   Temp: 99.5 F (37.5 C) 97.9 F (36.6 C) 97.7 F (36.5 C) (!) 97.5 F (36.4 C)  TempSrc: Oral Oral Oral Oral  SpO2: 90% 100% 100% 97%  Weight:      Height:       Supplemental O2: Room Air SpO2: 97 % O2 Flow Rate (L/min): 2 L/min  Physical Exam:  Sitting in chair, NAD Heart with regular rate and rhythm, no murmurs. Diminished breath sounds.   Lower extremities with bilateral dermatitis and chronic venous disease/lymphedema. L AVF access with palpable thrill  Filed Weights   04/09/24 1031 04/09/24 2109 04/10/24 2247  Weight: 71.9 kg 80.4 kg 73.8 kg     Intake/Output Summary (Last 24 hours) at 04/12/2024 1003 Last data filed at 04/12/2024 0650 Gross per 24 hour  Intake 100 ml  Output 490 ml  Net -390 ml    Net IO Since Admission: -2,462.67 mL [04/12/24 1003]  Recent Labs    04/11/24 1146  GLUCAP 110*      Pertinent Labs:    Latest Ref Rng & Units 04/12/2024    6:19 AM 04/11/2024    5:31 AM 04/09/2024    4:52 AM  CBC  WBC 4.0 - 10.5 K/uL 6.9  6.1  7.3   Hemoglobin 13.0 - 17.0 g/dL 16.1  09.6  04.5   Hematocrit 39.0 - 52.0 % 35.2  35.3  36.9   Platelets 150 - 400 K/uL 131  117  148        Latest Ref Rng & Units 04/12/2024    6:19 AM 04/11/2024    5:31 AM 04/09/2024     7:58 AM  CMP  Glucose 70 - 99 mg/dL 69  96  73   BUN 8 - 23 mg/dL 54  46  48   Creatinine 0.61 - 1.24 mg/dL 4.09  8.11  9.14   Sodium 135 - 145 mmol/L 137  138  135   Potassium 3.5 - 5.1 mmol/L 5.0  4.7  4.3   Chloride 98 - 111 mmol/L 98  98  96   CO2 22 - 32 mmol/L 26  29  27    Calcium  8.9 - 10.3 mg/dL 9.5  9.7  9.5     Imaging: CT CHEST WO CONTRAST Result Date: 04/11/2024 CLINICAL DATA:  Empyema EXAM: CT CHEST WITHOUT CONTRAST TECHNIQUE: Multidetector CT imaging of the chest was performed following the standard protocol without IV contrast. RADIATION DOSE REDUCTION: This exam was performed according to the departmental dose-optimization program which includes automated exposure control, adjustment of the mA and/or kV according to patient size and/or use of iterative reconstruction technique. COMPARISON:  04/06/2024 FINDINGS: Cardiovascular: Limited unenhanced imaging of the heart demonstrates mild cardiomegaly without pericardial effusion. Normal  caliber of the thoracic aorta. Stable atherosclerosis of the aorta and coronary vasculature. Assessment of the vascular lumen cannot be performed without intravenous contrast. Mediastinum/Nodes: No enlarged mediastinal or axillary lymph nodes. Thyroid  gland, trachea, and esophagus demonstrate no significant findings. Lungs/Pleura: There is a tunneled right pleural catheter along the right hemidiaphragm. Interval decrease in the fluid component of the right empyema noted previously, with increased gas lucency likely due to indwelling drain and recent procedure. Dense consolidation within the right middle and right lower lobe unchanged. Calcified left pleural plaque again noted. Minimal left basilar atelectasis. No left pneumothorax. Central airways are patent. Upper Abdomen: No acute abnormality. Musculoskeletal: No acute or destructive bony abnormalities. Reconstructed images demonstrate no additional findings. IMPRESSION: 1. Interval placement of a tunneled  right pleural drain, with decreased fluid component of the reported empyema noted on prior study. Increased gas within the right pleural space likely due to recent placement of the pleural drain. 2. Continued masslike consolidation within the right middle and right lower lobe, stable. 3. Stable calcified left pleural plaque with minimal left basilar atelectasis. 4. Aortic Atherosclerosis (ICD10-I70.0). Coronary artery atherosclerosis. Electronically Signed   By: Bobbye Burrow M.D.   On: 04/11/2024 17:39     Assessment/Plan:   Principal Problem:   Pleural effusion Active Problems:   Stage 5 chronic kidney disease on chronic dialysis [facility])   Fall   Patient Summary: [name] is a 73 y.o. with a pertinent PMH of A-fib on A/C, type 2 diabetes, hypertension, ESRD on hemodialysis who presented from SNF after a fall, found to have right pleural effusion, S/p thoracentesis x2  # Lymphocytic predominant exudative pleural effusion # Right lung mass vs atelectasis Chest tube output still elevated, etiology could be ESRD vs. Infection vs.malignancy.  He has completed 5-day course of Unasyn  for presumed lung infection. Asymptomatic, sating on room air.  Repeat CT of the chest yesterday showed stable masslike consolidation within the right middle and lower lobe.  He does have some pneumothorax ex vacuo and I am uncertain how much of this will truly re-expand.  PCCM following, I spoke to consultant, Dr. Dione Franks, she says she will talk to other consultant about this case, including thoracic surgery and provide further recommendations.   Plan:  - CBC - Monitor chest tube output.  - Follow up with PCCM recommendations.  # ESRD (Tue/Th/ Sat) # Normocytic anemia He is wrongly of the impression he was dialyzed on both 4/29 and 4/30. Per chart review, he was last dialyzed on 4/29.  Electrolytes and Hb stable.  He is agreeable to getting dialyzed tomorrow.  -Monitor BMP.  Chronic medical conditions:    #Hypothyroidism -Levothyroxine  50 mcg.   #Hypertension - Lisinopril  40 mg. - Hydralazine  25 mg PRN  Diet: Renal diet IVF: PO intake VTE: apixaban  (ELIQUIS ) tablet 5 mg  Code: Full PT/OT: Pending ID:  Anti-infectives (From admission, onward)    Start     Dose/Rate Route Frequency Ordered Stop   04/06/24 1800  Ampicillin -Sulbactam (UNASYN ) 3 g in sodium chloride  0.9 % 100 mL IVPB        3 g 200 mL/hr over 30 Minutes Intravenous Every 24 hours 04/06/24 1444        Anticipated discharge to Skilled nursing facility pending medical stabilization.  Marni Sins, MD 04/12/2024, 10:03 AM Pager: [phone] [facility]

## 2024-04-13 ENCOUNTER — Inpatient Hospital Stay (HOSPITAL_COMMUNITY)

## 2024-04-13 DIAGNOSIS — J90 Pleural effusion, not elsewhere classified: Secondary | ICD-10-CM | POA: Diagnosis not present

## 2024-04-13 DIAGNOSIS — Z87891 Personal history of nicotine dependence: Secondary | ICD-10-CM | POA: Diagnosis not present

## 2024-04-13 DIAGNOSIS — R918 Other nonspecific abnormal finding of lung field: Secondary | ICD-10-CM | POA: Diagnosis not present

## 2024-04-13 DIAGNOSIS — N186 End stage renal disease: Secondary | ICD-10-CM | POA: Diagnosis not present

## 2024-04-13 LAB — CBC
HCT: 31.6 % — ABNORMAL LOW (ref 39.0–52.0)
Hemoglobin: 9.8 g/dL — ABNORMAL LOW (ref 13.0–17.0)
MCH: 28.8 pg (ref 26.0–34.0)
MCHC: 31 g/dL (ref 30.0–36.0)
MCV: 92.9 fL (ref 80.0–100.0)
Platelets: 120 10*3/uL — ABNORMAL LOW (ref 150–400)
RBC: 3.4 MIL/uL — ABNORMAL LOW (ref 4.22–5.81)
RDW: 16.1 % — ABNORMAL HIGH (ref 11.5–15.5)
WBC: 5.7 10*3/uL (ref 4.0–10.5)
nRBC: 0 % (ref 0.0–0.2)

## 2024-04-13 LAB — BASIC METABOLIC PANEL WITH GFR
Anion gap: 10 (ref 5–15)
BUN: 67 mg/dL — ABNORMAL HIGH (ref 8–23)
CO2: 28 mmol/L (ref 22–32)
Calcium: 8.9 mg/dL (ref 8.9–10.3)
Chloride: 100 mmol/L (ref 98–111)
Creatinine, Ser: 10.98 mg/dL — ABNORMAL HIGH (ref 0.61–1.24)
GFR, Estimated: 4 mL/min — ABNORMAL LOW (ref >60)
Glucose, Bld: 74 mg/dL (ref 70–99)
Potassium: 5 mmol/L (ref 3.5–5.1)
Sodium: 138 mmol/L (ref 135–145)

## 2024-04-13 MED ORDER — DOXERCALCIFEROL 4 MCG/2ML IV SOLN
INTRAVENOUS | Status: AC
  Filled 2024-04-13: qty 2

## 2024-04-13 NOTE — Progress Notes (Addendum)
 Entered room 06:45 this morning pt had moderate amount of bleeding around the chest tube insertion site.pt had no c/o dizziness,shortness of breath.direct pressure applied and reinforced with dressing.No signs of worsening bleeding,vital signs stable sats 96%on room air,internal medicine notified and came to see the pt,Rapid response notified and came to see the patient.No signs of distress,

## 2024-04-13 NOTE — Progress Notes (Signed)
 Mobility Specialist: Progress Note   04/13/24 0949  Mobility  Activity Refused mobility  Mobility Specialist Start Time (ACUTE ONLY) 0949  Mobility Specialist Stop Time (ACUTE ONLY) 0949  Mobility Specialist Time Calculation (min) (ACUTE ONLY) 0 min    Pt refused all mobility and stated "I have dialysis today".   Deloria Fetch Mobility Specialist Please contact via SecureChat or Rehab office at [phone]

## 2024-04-13 NOTE — Progress Notes (Signed)
 [facility] Progress Note   Subjective:   Pt agreeable for dialysis today but only wants to complete 2 hours, encouraged to try to stay for full treatment. Denies SOB, CP, dizziness.   Objective Vitals:   04/12/24 1719 04/12/24 1932 04/13/24 0553 04/13/24 0837  BP: (!) 141/53 124/60 139/68 137/60  Pulse: 68 64 60 (!) 52  Resp:  16 19 18   Temp: 98 F (36.7 C) 98.7 F (37.1 C) 98.8 F (37.1 C) 98.6 F (37 C)  TempSrc: Oral Oral Oral   SpO2: 97% 94% 91% 100%  Weight:      Height:       Physical Exam General: Alert male in NAD  Heart: RRR, no murmurs, rubs or gallops Lungs: Chest tube draining blood tinged fluid, no rhochi/rales Abdomen: Soft, non-distended, +BS Extremities: trace firm edema/venous stasis dermatitis Dialysis Access:  LUE AVF + t/b  Additional Objective Labs: Basic Metabolic Panel: Recent Labs  Lab 04/08/24 0457 04/09/24 0758 04/11/24 0531 04/12/24 0619 04/13/24 0528  NA 133* 135 138 137 138  K 4.2 4.3 4.7 5.0 5.0  CL 97* 96* 98 98 100  CO2 24 27 29 26 28   GLUCOSE 79 73 96 69* 74  BUN 33* 48* 46* 54* 67*  CREATININE 7.55* 9.19* 8.53* 9.80* 10.98*  CALCIUM  9.1 9.5 9.7 9.5 8.9  PHOS 5.4* 5.4*  --   --   --    Liver Function Tests: Recent Labs  Lab 04/08/24 0457 04/09/24 0758  ALBUMIN 2.7* 2.8*   No results for input(s): "LIPASE", "AMYLASE" in the last 168 hours. CBC: Recent Labs  Lab 04/08/24 0457 04/08/24 2122 04/09/24 0452 04/11/24 0531 04/12/24 0619 04/13/24 0528  WBC 7.0  --  7.3 6.1 6.9 5.7  NEUTROABS 4.9  --   --   --   --   --   HGB 11.7*  --  11.6* 10.9* 11.1* 9.8*  HCT 37.0*   < > 36.9* 35.3* 35.2* 31.6*  MCV 93.9  --  93.2 95.1 92.9 92.9  PLT 126*  --  148* 117* 131* 120*   < > = values in this interval not displayed.   Blood Culture    Component Value Date/Time   SDES FLUID 04/06/2024 1346   SPECREQUEST NONE 04/06/2024 1346   CULT  04/06/2024 1346    NO GROWTH 3 DAYS Performed at [facility]  Lab, [address], [place], [place] [address]    REPTSTATUS 04/10/2024 FINAL 04/06/2024 1346    Cardiac Enzymes: No results for input(s): "CKTOTAL", "CKMB", "CKMBINDEX", "TROPONINI" in the last 168 hours. CBG: Recent Labs  Lab 04/11/24 1146  GLUCAP 110*   Iron Studies: No results for input(s): "IRON", "TIBC", "TRANSFERRIN", "FERRITIN" in the last 72 hours. @lablastinr3 @ Studies/Results: CT CHEST WO CONTRAST Result Date: 04/11/2024 CLINICAL DATA:  Empyema EXAM: CT CHEST WITHOUT CONTRAST TECHNIQUE: Multidetector CT imaging of the chest was performed following the standard protocol without IV contrast. RADIATION DOSE REDUCTION: This exam was performed according to the departmental dose-optimization program which includes automated exposure control, adjustment of the mA and/or kV according to patient size and/or use of iterative reconstruction technique. COMPARISON:  04/06/2024 FINDINGS: Cardiovascular: Limited unenhanced imaging of the heart demonstrates mild cardiomegaly without pericardial effusion. Normal caliber of the thoracic aorta. Stable atherosclerosis of the aorta and coronary vasculature. Assessment of the vascular lumen cannot be performed without intravenous contrast. Mediastinum/Nodes: No enlarged mediastinal or axillary lymph nodes. Thyroid  gland, trachea, and esophagus demonstrate no significant findings. Lungs/Pleura:  There is a tunneled right pleural catheter along the right hemidiaphragm. Interval decrease in the fluid component of the right empyema noted previously, with increased gas lucency likely due to indwelling drain and recent procedure. Dense consolidation within the right middle and right lower lobe unchanged. Calcified left pleural plaque again noted. Minimal left basilar atelectasis. No left pneumothorax. Central airways are patent. Upper Abdomen: No acute abnormality. Musculoskeletal: No acute or destructive bony abnormalities. Reconstructed images demonstrate no additional  findings. IMPRESSION: 1. Interval placement of a tunneled right pleural drain, with decreased fluid component of the reported empyema noted on prior study. Increased gas within the right pleural space likely due to recent placement of the pleural drain. 2. Continued masslike consolidation within the right middle and right lower lobe, stable. 3. Stable calcified left pleural plaque with minimal left basilar atelectasis. 4. Aortic Atherosclerosis (ICD10-I70.0). Coronary artery atherosclerosis. Electronically Signed   By: Bobbye Burrow M.D.   On: 04/11/2024 17:39   Medications:   (feeding supplement) PROSource Plus  30 mL Oral BID BM   apixaban   5 mg Oral BID   aspirin   81 mg Oral Daily   atorvastatin   80 mg Oral QHS   cinacalcet   30 mg Oral Q T,Th,Sat-1800   doxercalciferol   6 mcg Intravenous Q T,Th,Sa-HD   feeding supplement (NEPRO CARB STEADY)  237 mL Oral BID BM   hydrALAZINE   25 mg Oral TID   hydrocerin   Topical BID   levothyroxine   50 mcg Oral Q0600   lidocaine  (PF)  10 mL Intradermal Once   lisinopril   40 mg Oral QHS   multivitamin  1 tablet Oral QHS   sucroferric oxyhydroxide  1,000 mg Oral TID WC    Dialysis Orders: [place], T,Th, S 4 hrs 180NRe 450/Autoflow 1.5 74.6 kg 2.0 K/ 2.0 Ca AVF - Heparin  3000 units IV initial bolus and 3000 units IV mid run - Hectorol  6 mcg IV three times per week - Sensipar  30 mg PO three times per week - Venofer  50 mg IV weekly    Assessment/Plan:  R pleural effusion- pulmonology consulting, CT-guided chest tube placement  4/29,  noted pleural fluid cytology negative. On unasyn .   Hypokalemia: resolved  ESRD -HD TTS on dialysis schedule, refused Thursday. Encouraged to stay for full HD today.   Hypertension/volume  - hypotensive when seen by EMS. BP stable now. Continue home meds. He seems to be losing wt, Will attempt to lower and optimize volume with HD. UF as tolerated.   Anemia  - HGB at 9.8,  follow labs, cytology negative so likely safe to  resume ESA if needed.   Metabolic bone disease -corrected calcium  over 10 will hold VDRA, continue Sensipar , velphoro  binders.  Phosphorus controlled  Nutrition - Low albumin, K+ stable. Can have regular diet with fluid restrictions, protein supplement   Ramona Burner, PA-C 04/13/2024, 10:15 AM  [facility] Pager: [phone]

## 2024-04-13 NOTE — Progress Notes (Signed)
 HD#8 Subjective:  Summary: [name] is a 73 year old male with past medical history of ESRD on hemodialysis, a fib on A/C, type 2 diabetes, and hypertension, who presented from skilled nursing facility[facility] by EMS after he had a fall,  and found to have exudative pleural effusion.  Overnight Events: No events overnight.  This morning pt's nurse noted moderate amount of bleeding around the chest tube insertion site.  Patient denies any fall or trauma last night.  Denies shortness of breath, or pain around the chest tube insertion site.  Objective:  Vital signs in last 24 hours: Vitals:   04/12/24 0800 04/12/24 1719 04/12/24 1932 04/13/24 0553  BP: (!) 115/58 (!) 141/53 124/60 139/68  Pulse: (!) 58 68 64 60  Resp:   16 19  Temp: (!) 97.5 F (36.4 C) 98 F (36.7 C) 98.7 F (37.1 C) 98.8 F (37.1 C)  TempSrc: Oral Oral Oral Oral  SpO2: 97% 97% 94% 91%  Weight:      Height:       Supplemental O2: Room Air SpO2: 91 % O2 Flow Rate (L/min): 2 L/min  Physical Exam:  Sitting in chair, NAD Heart with regular rate and rhythm, no murmurs. Diminished breath sounds. Bleeding around area of chest tube insertion.  Lower extremities with bilateral dermatitis and chronic venous disease/lymphedema. L AVF access with palpable thrill  Filed Weights   04/09/24 1031 04/09/24 2109 04/10/24 2247  Weight: 71.9 kg 80.4 kg 73.8 kg     Intake/Output Summary (Last 24 hours) at 04/13/2024 0814 Last data filed at 04/12/2024 1900 Gross per 24 hour  Intake --  Output 50 ml  Net -50 ml    Net IO Since Admission: -2,512.67 mL [04/13/24 0814]  Recent Labs    04/11/24 1146  GLUCAP 110*      Pertinent Labs:    Latest Ref Rng & Units 04/13/2024    5:28 AM 04/12/2024    6:19 AM 04/11/2024    5:31 AM  CBC  WBC 4.0 - 10.5 K/uL 5.7  6.9  6.1   Hemoglobin 13.0 - 17.0 g/dL 9.8  16.1  09.6   Hematocrit 39.0 - 52.0 % 31.6  35.2  35.3   Platelets 150 - 400 K/uL 120  131  117        Latest  Ref Rng & Units 04/13/2024    5:28 AM 04/12/2024    6:19 AM 04/11/2024    5:31 AM  CMP  Glucose 70 - 99 mg/dL 74  69  96   BUN 8 - 23 mg/dL 67  54  46   Creatinine 0.61 - 1.24 mg/dL 04.54  0.98  1.19   Sodium 135 - 145 mmol/L 138  137  138   Potassium 3.5 - 5.1 mmol/L 5.0  5.0  4.7   Chloride 98 - 111 mmol/L 100  98  98   CO2 22 - 32 mmol/L 28  26  29    Calcium  8.9 - 10.3 mg/dL 8.9  9.5  9.7     Imaging: No results found.    Assessment/Plan:   Principal Problem:   Pleural effusion Active Problems:   Stage 5 chronic kidney disease on chronic dialysis [facility])   Fall   Patient Summary: [name] is a 73 y.o. with a pertinent PMH of A-fib on A/C, type 2 diabetes, hypertension, ESRD on hemodialysis who presented from SNF after a fall, found to have right pleural effusion, S/p thoracentesis x2 and placement of right  chest tube.   # Lymphocytic predominant exudative pleural effusion # Right lung mass vs atelectasis Moderate bleeding from chest tube insertion site,  with about 50 cc of bloody output.  Tube was flushed, met with resistance, this could be secondary to clogging vs malpositioning of the tube.  Will order chest x-ray to evaluate this further. Hemoglobin down trended from 11 to 9.8, suspect this is due to  bleeding from chest tube site. We consulted CT surgery yesterday, was seen by PA, Dr. Sherene Dilling is yet to put in his formal recommendations.  Plan:  - PCCM and CT surgery following appreciate recs. - Follow-up on a chest x-ray. - Monitor chest tube output.  # ESRD (Tue/Th/ Sat) # Normocytic anemia Stable electrolytes, Hb 11.1>>>9.8. Scheduled for HD session today.   -Monitor BMP.  Chronic medical conditions:   #Hypothyroidism -Levothyroxine  50 mcg.   #Hypertension - Lisinopril  40 mg. - Hydralazine  25 mg PRN  Diet: Renal diet IVF: PO intake VTE: apixaban  (ELIQUIS ) tablet 5 mg  Code: Full PT/OT: Pending ID:  Anti-infectives (From admission, onward)     Start     Dose/Rate Route Frequency Ordered Stop   04/06/24 1800  Ampicillin -Sulbactam (UNASYN ) 3 g in sodium chloride  0.9 % 100 mL IVPB  Status:  Discontinued        3 g 200 mL/hr over 30 Minutes Intravenous Every 24 hours 04/06/24 1444 04/12/24 1149      Anticipated discharge to Skilled nursing facility pending medical stabilization.  Marni Sins, MD 04/13/2024, 8:14 AM Pager: [phone] [name]

## 2024-04-13 NOTE — Plan of Care (Signed)
  Problem: Health Behavior/Discharge Planning: Goal: Ability to manage health-related needs will improve Outcome: Progressing   Problem: Clinical Measurements: Goal: Ability to maintain clinical measurements within normal limits will improve Outcome: Progressing Goal: Will remain free from infection Outcome: Progressing Goal: Diagnostic test results will improve Outcome: Progressing Goal: Respiratory complications will improve Outcome: Progressing Goal: Cardiovascular complication will be avoided Outcome: Progressing   Problem: Activity: Goal: Risk for activity intolerance will decrease Outcome: Progressing   Problem: Nutrition: Goal: Adequate nutrition will be maintained Outcome: Progressing   Problem: Coping: Goal: Level of anxiety will decrease Outcome: Progressing   Problem: Elimination: Goal: Will not experience complications related to bowel motility Outcome: Progressing Goal: Will not experience complications related to urinary retention Outcome: Progressing   Problem: Pain Managment: Goal: General experience of comfort will improve and/or be controlled Outcome: Progressing   Problem: Safety: Goal: Ability to remain free from injury will improve Outcome: Progressing   Problem: Skin Integrity: Goal: Risk for impaired skin integrity will decrease Outcome: Progressing   Problem: Education: Goal: Knowledge of disease and its progression will improve Outcome: Progressing   Problem: Fluid Volume: Goal: Compliance with measures to maintain balanced fluid volume will improve Outcome: Progressing   Problem: Health Behavior/Discharge Planning: Goal: Ability to manage health-related needs will improve Outcome: Progressing   Problem: Nutritional: Goal: Ability to make healthy dietary choices will improve Outcome: Progressing   Problem: Clinical Measurements: Goal: Complications related to the disease process, condition or treatment will be avoided or  minimized Outcome: Progressing

## 2024-04-13 NOTE — Progress Notes (Signed)
   NAME:  [name], MRN:  [id number], DOB:  15-Jul-1951, LOS: 7 ADMISSION DATE:  04/05/2024, CONSULTATION DATE:  04/06/2024 REFERRING MD:  Kirt Pereyra MD, CHIEF COMPLAINT:   Pleural effusion, abnormal CT  History of Present Illness:   73 year old with history of end-stage renal disease on hemodialysis, hypothyroidism, hypertension, atrial fibrillation on Eliquis  Presenting after fall out of bed yesterday morning.  He fell on the right side and noted to have right-sided pleural effusion Underwent thoracentesis by IR yesterday with findings of bloody exudative effusion with lymphocyte predominance Follow-up CT shows possible lung mass and persistent moderate effusion and PCCM consulted for help with management.  He is a ex-smoker but quit at the age of 55.  Used to work in transportation.  May have had some exposure to asbestos in the past but cannot recall clearly Ongoing exposures.  Pertinent  Medical History    has a past medical history of Arthritis, Asthma, Chronic cystitis, Chronic diastolic heart failure (HCC), Chronic kidney disease, Diabetes mellitus, Dysrhythmia, GERD (gastroesophageal reflux disease), H/O hiatal hernia, Hyperlipidemia, Hypertension, Lymphedema, Morbid obesity (HCC), NEPHROLITHIASIS, HX OF (12/02/2009), PVD (peripheral vascular disease) (HCC), Renal insufficiency, UMBILICAL HERNIA (02/08/2007), and Venous insufficiency.   Significant Hospital Events: Including procedures, antibiotic start and stop dates in addition to other pertinent events   4/25 admit, right thoracentesis delayed due to anticoagulation 4/26 no issues overnight, awaiting IR thoracentesis with tentative plans for IR placed smallbore chest tube tomorrow 4/27 IR guided thoracentesis  Interim History / Subjective:  No acute events chest tube with minimal drainage. Concern about it not draining as it is not able to be flushed this morning. Not a candidate for surgical intervention.  Objective    Blood pressure 137/60, pulse (!) 52, temperature 98.6 F (37 C), resp. rate 18, height 5\' 10"  (1.778 m), weight 73.8 kg, SpO2 100%.        Intake/Output Summary (Last 24 hours) at 04/13/2024 1403 Last data filed at 04/12/2024 1900 Gross per 24 hour  Intake --  Output 50 ml  Net -50 ml   Filed Weights   04/09/24 1031 04/09/24 2109 04/10/24 2247  Weight: 71.9 kg 80.4 kg 73.8 kg    Examination: Laying in bed, resting comfortably Breathing non labored Room air Right chest tube in place, serosanguinous drainage, no air leak  Resolved Hospital Problem list     Assessment & Plan:  Right sided pleural effusion, exudative, lymphocyte predominant Possible right lung mass vs rounded atelectasis noted on CT Chest H/o former remote tobacco use ESRD on HD  No surgical intervention per CT Surgery Lung appears to be trapped so low yield for pleural lytics for the risks it poses Will remove chest tube tody  PCCM will sign off.  Duaine German, MD [facility] Office: [phone]   See Amion for personal pager PCCM on call pager [phone] until 7pm. Please call Elink 7p-7a. [phone]

## 2024-04-13 NOTE — Progress Notes (Addendum)
 Received patient in bed.Awake,alert and oriented x 4.Consent verified.  Access used : Left arm avf that worked well.  Duration of treatment: 2 out of 3 hours prescribed.  UF goal: Met 2.1 liters,tolerated treatment.  Medicine given: Hectorol  6 mcg.  Hemo comment: He quit again on his last hour of treatment thus 2.1 liters of uf.He signed AMA  form.  Hand off to the patient's nurse,into his room via transporter with stable condition.

## 2024-04-14 DIAGNOSIS — J90 Pleural effusion, not elsewhere classified: Secondary | ICD-10-CM | POA: Diagnosis not present

## 2024-04-14 LAB — CHOLESTEROL, BODY FLUID: Cholesterol, Fluid: 87 mg/dL

## 2024-04-14 LAB — CBC
HCT: 33.8 % — ABNORMAL LOW (ref 39.0–52.0)
HCT: 35.4 % — ABNORMAL LOW (ref 39.0–52.0)
Hemoglobin: 10.8 g/dL — ABNORMAL LOW (ref 13.0–17.0)
Hemoglobin: 11.1 g/dL — ABNORMAL LOW (ref 13.0–17.0)
MCH: 29.6 pg (ref 26.0–34.0)
MCH: 29.2 pg (ref 26.0–34.0)
MCHC: 32 g/dL (ref 30.0–36.0)
MCHC: 31.4 g/dL (ref 30.0–36.0)
MCV: 92.6 fL (ref 80.0–100.0)
MCV: 93.2 fL (ref 80.0–100.0)
Platelets: 129 K/uL — ABNORMAL LOW (ref 150–400)
Platelets: 138 10*3/uL — ABNORMAL LOW (ref 150–400)
RBC: 3.65 MIL/uL — ABNORMAL LOW (ref 4.22–5.81)
RBC: 3.8 MIL/uL — ABNORMAL LOW (ref 4.22–5.81)
RDW: 16.3 % — ABNORMAL HIGH (ref 11.5–15.5)
RDW: 16.2 % — ABNORMAL HIGH (ref 11.5–15.5)
WBC: 5.7 K/uL (ref 4.0–10.5)
WBC: 6.8 10*3/uL (ref 4.0–10.5)
nRBC: 0 % (ref 0.0–0.2)
nRBC: 0 % (ref 0.0–0.2)

## 2024-04-14 NOTE — TOC Progression Note (Signed)
 Transition of Care [facility]) - Progression Note    Patient Details  Name: DUSHAUN NISBET MRN: [id number] Date of Birth: [birth year]  Transition of [facility] CM/SW Contact  Canisha Issac A [place], LCSW Phone Number: 04/14/2024, 11:24 AM  Clinical Narrative:     CSW spoke with [facility], informed by MD possible DC today. Pt no longer stable for DC today, facility notified. EDD tomorrow.     TOC will continue to follow.   Expected Discharge Plan: Skilled Nursing Facility Barriers to Discharge: Continued Medical Work up  Expected Discharge Plan and Services In-house Referral: Clinical Social Work   Post Acute Care Choice: Skilled Nursing Facility Living arrangements for the past 2 months: Skilled Nursing Facility                                       Social Determinants of Health (SDOH) Interventions SDOH Screenings   Food Insecurity: No Food Insecurity (04/05/2024)  Housing: Low Risk  (04/05/2024)  Transportation Needs: No Transportation Needs (04/05/2024)  Utilities: Not At Risk (04/05/2024)  Social Connections: Moderately Isolated (04/05/2024)  Tobacco Use: Medium Risk (04/05/2024)    Readmission Risk Interventions     No data to display

## 2024-04-14 NOTE — Progress Notes (Signed)
 [facility] Progress Note   Subjective:   Did sign off HD after 2 hours. Reports feeling well today, chest tube removed yesterday. Denies SOB, CP, dizziness. Asking when he can go home.   Objective Vitals:   04/13/24 1846 04/13/24 1907 04/13/24 2300 04/14/24 0634  BP: (!) 123/57  (!) 152/59 (!) 136/56  Pulse:   (!) 52 (!) 58  Resp:   16   Temp: 98.3 F (36.8 C)  98.3 F (36.8 C) 97.6 F (36.4 C)  TempSrc: Oral  Oral   SpO2:   99%   Weight:  70.1 kg    Height:       Physical Exam General: Alert male in NAD  Heart: RRR, no murmurs, rubs or gallops Lungs: CTA bilaterally, respirations unlabored Abdomen: Soft, non-distended, +BS Extremities: trace firm edema/venous stasis dermatitis Dialysis Access:  LUE AVF + t/b  Additional Objective Labs: Basic Metabolic Panel: Recent Labs  Lab 04/08/24 0457 04/09/24 0758 04/11/24 0531 04/12/24 0619 04/13/24 0528  NA 133* 135 138 137 138  K 4.2 4.3 4.7 5.0 5.0  CL 97* 96* 98 98 100  CO2 24 27 29 26 28   GLUCOSE 79 73 96 69* 74  BUN 33* 48* 46* 54* 67*  CREATININE 7.55* 9.19* 8.53* 9.80* 10.98*  CALCIUM  9.1 9.5 9.7 9.5 8.9  PHOS 5.4* 5.4*  --   --   --    Liver Function Tests: Recent Labs  Lab 04/08/24 0457 04/09/24 0758  ALBUMIN 2.7* 2.8*   No results for input(s): "LIPASE", "AMYLASE" in the last 168 hours. CBC: Recent Labs  Lab 04/08/24 0457 04/08/24 2122 04/09/24 0452 04/11/24 0531 04/12/24 0619 04/13/24 0528  WBC 7.0  --  7.3 6.1 6.9 5.7  NEUTROABS 4.9  --   --   --   --   --   HGB 11.7*  --  11.6* 10.9* 11.1* 9.8*  HCT 37.0*   < > 36.9* 35.3* 35.2* 31.6*  MCV 93.9  --  93.2 95.1 92.9 92.9  PLT 126*  --  148* 117* 131* 120*   < > = values in this interval not displayed.   Blood Culture    Component Value Date/Time   SDES FLUID 04/06/2024 1346   SPECREQUEST NONE 04/06/2024 1346   CULT  04/06/2024 1346    NO GROWTH 3 DAYS Performed at [facility], [address], [place],  [place] [address]    REPTSTATUS 04/10/2024 FINAL 04/06/2024 1346    Cardiac Enzymes: No results for input(s): "CKTOTAL", "CKMB", "CKMBINDEX", "TROPONINI" in the last 168 hours. CBG: Recent Labs  Lab 04/11/24 1146  GLUCAP 110*   Iron Studies: No results for input(s): "IRON", "TIBC", "TRANSFERRIN", "FERRITIN" in the last 72 hours. @lablastinr3 @ Studies/Results: DG Chest Port 1 View Result Date: 04/13/2024 CLINICAL DATA:  Evaluate thoracostomy tube placement EXAM: PORTABLE CHEST 1 VIEW COMPARISON:  04/11/2024 FINDINGS: Small bore chest tube is identified overlying the right lower lung. This is stable in position from the prior exam. Unchanged appearance of the loculated right pleural fluid collection containing gas. Small left pleural effusion identified with overlying pleural calcification. Pulmonary vascular congestion is unchanged. IMPRESSION: 1. Stable position of right chest tube. 2. Unchanged appearance of loculated right pleural fluid collection containing gas. 3. Unchanged small left pleural effusion with overlying pleural calcification. Electronically Signed   By: Kimberley Penman M.D.   On: 04/13/2024 11:55   Medications:   (feeding supplement) PROSource Plus  30 mL Oral BID BM  apixaban   5 mg Oral BID   aspirin   81 mg Oral Daily   atorvastatin   80 mg Oral QHS   cinacalcet   30 mg Oral Q T,Th,Sat-1800   doxercalciferol   6 mcg Intravenous Q T,Th,Sa-HD   feeding supplement (NEPRO CARB STEADY)  237 mL Oral BID BM   hydrALAZINE   25 mg Oral TID   hydrocerin   Topical BID   levothyroxine   50 mcg Oral Q0600   lidocaine  (PF)  10 mL Intradermal Once   lisinopril   40 mg Oral QHS   multivitamin  1 tablet Oral QHS   sucroferric oxyhydroxide  1,000 mg Oral TID WC    Dialysis Orders: East, T,Th, S 4 hrs 180NRe 450/Autoflow 1.5 74.6 kg 2.0 K/ 2.0 Ca AVF - Heparin  3000 units IV initial bolus and 3000 units IV mid run - Hectorol  6 mcg IV three times per week - Sensipar  30 mg PO three times per  week - Venofer  50 mg IV weekly  Assessment/Plan:  R pleural effusion- pulmonology consulting, CT-guided chest tube placement  4/29,  noted pleural fluid cytology negative. Completed antibiotics. Chest tune now removed.   Hypokalemia: resolved  ESRD -HD TTS on dialysis schedule, refused Thursday and shortened Saturday. Fortunately no signs of uremia or volume overload.   Hypertension/volume  - hypotensive when seen by EMS. BP stable now. Continue home meds. He seems to be losing wt, Will attempt to lower and optimize volume with HD. UF as tolerated.   Anemia  - HGB at 9.8,  follow labs, cytology negative so likely safe to resume ESA if needed.   Metabolic bone disease -VDRA on hold due to high calcium , continue Sensipar  and binders.  Phosphorus controlled  Nutrition - Low albumin, K+ stable. Can have regular diet with fluid restrictions, protein supplement   Ramona Burner, PA-C 04/14/2024, 8:26 AM  [facility] Pager: [phone]

## 2024-04-14 NOTE — Progress Notes (Addendum)
 HD#8 Subjective:  Summary: [name] is a 73 year old male with past medical history of ESRD on hemodialysis, a fib on A/C, type 2 diabetes, and hypertension, who presented from skilled nursing facility[facility] by EMS after he had a fall,  and found to have exudative pleural effusion.  Overnight Events: No events overnight.   Chest tube removed yesterday.  Still have some mild bleeding from chest tube insertion site.  Asymptomatic. Got dialyzed yesterday, no acute concerns.  Objective:  Vital signs in last 24 hours: Vitals:   04/13/24 1907 04/13/24 2300 04/14/24 0634 04/14/24 0854  BP:  (!) 152/59 (!) 136/56 (!) 115/49  Pulse:  (!) 52 (!) 58 (!) 56  Resp:  16  18  Temp:  98.3 F (36.8 C) 97.6 F (36.4 C) 97.9 F (36.6 C)  TempSrc:  Oral  Oral  SpO2:  99%    Weight: 70.1 kg     Height:       Supplemental O2: Room Air SpO2: 99 % O2 Flow Rate (L/min): 2 L/min  Physical Exam:  Sitting in chair, NAD Heart with regular rate and rhythm, no murmurs. Diminished breath sounds.  Mild bleeding around area of chest tube insertion.  Lower extremities with bilateral dermatitis and chronic venous disease/lymphedema. L AVF access with palpable thrill  Filed Weights   04/10/24 2247 04/13/24 1600 04/13/24 1907  Weight: 73.8 kg 72.3 kg 70.1 kg     Intake/Output Summary (Last 24 hours) at 04/14/2024 1045 Last data filed at 04/13/2024 1846 Gross per 24 hour  Intake --  Output 2100 ml  Net -2100 ml    Net IO Since Admission: -4,612.67 mL [04/14/24 1045]  Recent Labs    04/11/24 1146  GLUCAP 110*      Pertinent Labs:    Latest Ref Rng & Units 04/13/2024    5:28 AM 04/12/2024    6:19 AM 04/11/2024    5:31 AM  CBC  WBC 4.0 - 10.5 K/uL 5.7  6.9  6.1   Hemoglobin 13.0 - 17.0 g/dL 9.8  09.8  11.9   Hematocrit 39.0 - 52.0 % 31.6  35.2  35.3   Platelets 150 - 400 K/uL 120  131  117        Latest Ref Rng & Units 04/13/2024    5:28 AM 04/12/2024    6:19 AM 04/11/2024    5:31 AM   CMP  Glucose 70 - 99 mg/dL 74  69  96   BUN 8 - 23 mg/dL 67  54  46   Creatinine 0.61 - 1.24 mg/dL 14.78  2.95  6.21   Sodium 135 - 145 mmol/L 138  137  138   Potassium 3.5 - 5.1 mmol/L 5.0  5.0  4.7   Chloride 98 - 111 mmol/L 100  98  98   CO2 22 - 32 mmol/L 28  26  29    Calcium  8.9 - 10.3 mg/dL 8.9  9.5  9.7     Imaging: No results found.    Assessment/Plan:   Principal Problem:   Pleural effusion Active Problems:   Stage 5 chronic kidney disease on chronic dialysis [facility])   Fall   Patient Summary: [name] is a 73 y.o. with a pertinent PMH of A-fib on A/C, type 2 diabetes, hypertension, ESRD on hemodialysis who presented from SNF after a fall, found to have right pleural effusion, S/p thoracentesis x2 and placement of right chest tube.   # Lymphocytic predominant exudative pleural effusion #  Right lung mass vs atelectasis CT surgery saw and evaluated patient for this right lung mass.  From their perspective,he is a poor candidate for any surgical procedure and drainage of the residual loculated pleural effusion.  Recommended discontinuing chest tube vs. starting pleural lytics.  PCCM not recommending  lytics as patient lungs still appear trapped, also had bloody output from the chest tube.  Both PCCM, and CT surgery has signed off.  Chest tube was removed yesterday, still has mild bleeding from chest tube insertion site, unclear why.  Will try supportive measures to see if this resolves. He is medically stable for discharge, if we can get this bleeding around chest tube insertion site under control.   Plan:  - Monitor CBC - Holding Eliquis . - Monitor bleeding from chest tube insertion site. - Will probably need repeat imaging 6 to 12 months to further evaluate this lung mass.  # ESRD (Tue/Th/ Sat) # Normocytic anemia Got dialysis yesterday, stable electrolyte.  -Monitor BMP.  Chronic medical conditions:   #Hypothyroidism -Levothyroxine  50 mcg.    #Hypertension - Lisinopril  40 mg. - Hydralazine  25 mg PRN  Diet: Renal diet IVF: PO intake VTE: apixaban  (ELIQUIS ) tablet 5 mg  Code: Full PT/OT: Pending ID:  Anti-infectives (From admission, onward)    Start     Dose/Rate Route Frequency Ordered Stop   04/06/24 1800  Ampicillin -Sulbactam (UNASYN ) 3 g in sodium chloride  0.9 % 100 mL IVPB  Status:  Discontinued        3 g 200 mL/hr over 30 Minutes Intravenous Every 24 hours 04/06/24 1444 04/12/24 1149      Anticipated discharge to Skilled nursing facility pending medical stabilization.  Marni Sins, MD 04/14/2024, 10:45 AM [facility]

## 2024-04-14 NOTE — Progress Notes (Signed)
 Slow trickle of blood from R chest where chest tube was removed. Dressing changed 3 times during this 12 hour shift. Dressing of 12 4 x 4 gauze pads and topped with abdominal pad. Dr. Noorduddin following and aware of bleeding same, not decreased. Eliquis  and aspirin  held. Pt asymptomatic. Hgb rechecked and improved.

## 2024-04-14 NOTE — Plan of Care (Signed)
  Problem: Clinical Measurements: Goal: Ability to maintain clinical measurements within normal limits will improve Outcome: Progressing Goal: Will remain free from infection Outcome: Progressing Goal: Diagnostic test results will improve Outcome: Progressing Goal: Cardiovascular complication will be avoided Outcome: Progressing   Problem: Activity: Goal: Risk for activity intolerance will decrease Outcome: Progressing   Problem: Nutrition: Goal: Adequate nutrition will be maintained Outcome: Progressing   Problem: Coping: Goal: Level of anxiety will decrease Outcome: Progressing   Problem: Elimination: Goal: Will not experience complications related to bowel motility Outcome: Progressing Goal: Will not experience complications related to urinary retention Outcome: Progressing   Problem: Pain Managment: Goal: General experience of comfort will improve and/or be controlled Outcome: Progressing   Problem: Safety: Goal: Ability to remain free from injury will improve Outcome: Progressing   Problem: Skin Integrity: Goal: Risk for impaired skin integrity will decrease Outcome: Progressing   Out of bed to chair most of day. + BM, monitoring bleeding from chest tube removal

## 2024-04-15 ENCOUNTER — Inpatient Hospital Stay (HOSPITAL_COMMUNITY)

## 2024-04-15 DIAGNOSIS — N186 End stage renal disease: Secondary | ICD-10-CM | POA: Diagnosis not present

## 2024-04-15 DIAGNOSIS — Z992 Dependence on renal dialysis: Secondary | ICD-10-CM | POA: Diagnosis not present

## 2024-04-15 DIAGNOSIS — J90 Pleural effusion, not elsewhere classified: Secondary | ICD-10-CM | POA: Diagnosis not present

## 2024-04-15 LAB — BASIC METABOLIC PANEL WITH GFR
Anion gap: 11 (ref 5–15)
BUN: 63 mg/dL — ABNORMAL HIGH (ref 8–23)
CO2: 26 mmol/L (ref 22–32)
Calcium: 9.6 mg/dL (ref 8.9–10.3)
Chloride: 100 mmol/L (ref 98–111)
Creatinine, Ser: 10.75 mg/dL — ABNORMAL HIGH (ref 0.61–1.24)
GFR, Estimated: 5 mL/min — ABNORMAL LOW (ref >60)
Glucose, Bld: 108 mg/dL — ABNORMAL HIGH (ref 70–99)
Potassium: 4.8 mmol/L (ref 3.5–5.1)
Sodium: 137 mmol/L (ref 135–145)

## 2024-04-15 LAB — CBC
HCT: 29.8 % — ABNORMAL LOW (ref 39.0–52.0)
Hemoglobin: 9.4 g/dL — ABNORMAL LOW (ref 13.0–17.0)
MCH: 28.8 pg (ref 26.0–34.0)
MCHC: 31.5 g/dL (ref 30.0–36.0)
MCV: 91.4 fL (ref 80.0–100.0)
Platelets: 113 10*3/uL — ABNORMAL LOW (ref 150–400)
RBC: 3.26 MIL/uL — ABNORMAL LOW (ref 4.22–5.81)
RDW: 16 % — ABNORMAL HIGH (ref 11.5–15.5)
WBC: 5.6 10*3/uL (ref 4.0–10.5)
nRBC: 0 % (ref 0.0–0.2)

## 2024-04-15 MED ORDER — IOHEXOL 350 MG/ML SOLN
50.0000 mL | Freq: Once | INTRAVENOUS | Status: AC | PRN
  Administered 2024-04-15: 50 mL via INTRAVENOUS

## 2024-04-15 MED ORDER — CHLORHEXIDINE GLUCONATE CLOTH 2 % EX PADS
6.0000 | MEDICATED_PAD | Freq: Every day | CUTANEOUS | Status: DC
  Administered 2024-04-15 – 2024-04-16 (×2): 6 via TOPICAL

## 2024-04-15 NOTE — Progress Notes (Signed)
 Evaluated patient at bedside for concerns of bleeding from the chest tube site. Patient denies shortness of breath or chest pain. Exam showed continuous ooze of blood tinged fluid from the site of the chest tube placement, breath sounds present and symmetrical on both sides, regular rate and rhythm.   Patient's eliquis  5mg  BID was held on 04/14/2024  Today's Vitals   04/14/24 0854 04/14/24 1719 04/14/24 2128 04/15/24 0105  BP: (!) 115/49 (!) 149/73 131/65 (!) 149/66  Pulse: (!) 56 (!) 55 (!) 54 (!) 55  Resp: 18 18 16    Temp: 97.9 F (36.6 C)  (!) 97.5 F (36.4 C)   TempSrc: Oral  Oral   SpO2:  100% 97% 100%  Weight:      Height:      PainSc:  0-No pain       Plan: -Apply pressure, clean bandage -If wound is still bleeding in the morning, consider evaluation by PCCM -If bleeding worsens, he becomes short of breath, or has symptoms of anemia: will order STAT CXR and CBC

## 2024-04-15 NOTE — TOC Progression Note (Signed)
 Transition of Care [facility]) - Progression Note    Patient Details  Name: [name] MRN: [id number] Date of Birth: 23-Sep-1951  Transition of Care [facility] CM/SW Contact  Tandy Fam, [place] Phone Number: 04/15/2024, 11:11 AM  Clinical Narrative:   CSW spoke with MD, not medically stable to return to SNF yet. CSW updated [facility], to follow.    Expected Discharge Plan: Skilled Nursing Facility Barriers to Discharge: Continued Medical Work up  Expected Discharge Plan and Services In-house Referral: Clinical Social Work   Post Acute Care Choice: Skilled Nursing Facility Living arrangements for the past 2 months: Skilled Nursing Facility                                       Social Determinants of Health (SDOH) Interventions SDOH Screenings   Food Insecurity: No Food Insecurity (04/05/2024)  Housing: Low Risk  (04/05/2024)  Transportation Needs: No Transportation Needs (04/05/2024)  Utilities: Not At Risk (04/05/2024)  Social Connections: Moderately Isolated (04/05/2024)  Tobacco Use: Medium Risk (04/05/2024)    Readmission Risk Interventions     No data to display

## 2024-04-15 NOTE — Progress Notes (Signed)
 [facility] Progress Note   Subjective:    Seen and examined patient at bedside. No acute issues/complaints. Noted r-sided dressing from chest tube removal. Next HD 5/6 if only he remains inpatient.  Objective Vitals:   04/14/24 2128 04/15/24 0105 04/15/24 0519 04/15/24 0626  BP: 131/65 (!) 149/66 (!) 118/90 (!) 139/54  Pulse: (!) 54 (!) 55 92 (!) 58  Resp: 16  19 20   Temp: (!) 97.5 F (36.4 C) 98 F (36.7 C) 97.9 F (36.6 C) 97.7 F (36.5 C)  TempSrc: Oral Oral Oral Oral  SpO2: 97% 100% 99% 100%  Weight:      Height:       Physical Exam General: Alert male in NAD; RA Heart: RRR, no murmurs, rubs or gallops Lungs: CTA bilaterally, respirations unlabored Abdomen: Soft, non-distended, +BS Extremities: trace firm edema/venous stasis dermatitis Dialysis Access:  LUE AVF + t/b  [facility] Weights   04/10/24 2247 04/13/24 1600 04/13/24 1907  Weight: 73.8 kg 72.3 kg 70.1 kg    Intake/Output Summary (Last 24 hours) at 04/15/2024 0958 Last data filed at 04/14/2024 1800 Gross per 24 hour  Intake 175 ml  Output --  Net 175 ml    Additional Objective Labs: Basic Metabolic Panel: Recent Labs  Lab 04/09/24 0758 04/11/24 0531 04/12/24 0619 04/13/24 0528 04/15/24 0505  NA 135   < > 137 138 137  K 4.3   < > 5.0 5.0 4.8  CL 96*   < > 98 100 100  CO2 27   < > 26 28 26   GLUCOSE 73   < > 69* 74 108*  BUN 48*   < > 54* 67* 63*  CREATININE 9.19*   < > 9.80* 10.98* 10.75*  CALCIUM  9.5   < > 9.5 8.9 9.6  PHOS 5.4*  --   --   --   --    < > = values in this interval not displayed.   Liver Function Tests: Recent Labs  Lab 04/09/24 0758  ALBUMIN 2.8*   No results for input(s): "LIPASE", "AMYLASE" in the last 168 hours. CBC: Recent Labs  Lab 04/12/24 0619 04/13/24 0528 04/14/24 1035 04/14/24 1641 04/15/24 0505  WBC 6.9 5.7 5.7 6.8 5.6  HGB 11.1* 9.8* 10.8* 11.1* 9.4*  HCT 35.2* 31.6* 33.8* 35.4* 29.8*  MCV 92.9 92.9 92.6 93.2 91.4  PLT 131* 120* 129* 138*  113*   Blood Culture    Component Value Date/Time   SDES FLUID 04/06/2024 1346   SPECREQUEST NONE 04/06/2024 1346   CULT  04/06/2024 1346    NO GROWTH 3 DAYS Performed at [facility], [address], [place], [place] [address]    REPTSTATUS 04/10/2024 FINAL 04/06/2024 1346    Cardiac Enzymes: No results for input(s): "CKTOTAL", "CKMB", "CKMBINDEX", "TROPONINI" in the last 168 hours. CBG: Recent Labs  Lab 04/11/24 1146  GLUCAP 110*   Iron Studies: No results for input(s): "IRON", "TIBC", "TRANSFERRIN", "FERRITIN" in the last 72 hours. Lab Results  Component Value Date   INR 1.00 07/10/2018   INR 1.15 04/12/2017   INR 1.51 03/22/2017   Studies/Results: No results found.  Medications:   (feeding supplement) PROSource Plus  30 mL Oral BID BM   apixaban   5 mg Oral BID   aspirin   81 mg Oral Daily   atorvastatin   80 mg Oral QHS   cinacalcet   30 mg Oral Q T,Th,Sat-1800   doxercalciferol   6 mcg Intravenous Q T,Th,Sa-HD   feeding supplement (  NEPRO CARB STEADY)  237 mL Oral BID BM   hydrALAZINE   25 mg Oral TID   hydrocerin   Topical BID   levothyroxine   50 mcg Oral Q0600   lidocaine  (PF)  10 mL Intradermal Once   lisinopril   40 mg Oral QHS   multivitamin  1 tablet Oral QHS   sucroferric oxyhydroxide  1,000 mg Oral TID WC    Dialysis Orders: [place], T,Th, S 4 hrs 180NRe 450/Autoflow 1.5 74.6 kg 2.0 K/ 2.0 Ca AVF - Heparin  3000 units IV initial bolus and 3000 units IV mid run - Hectorol  6 mcg IV three times per week - Sensipar  30 mg PO three times per week - Venofer  50 mg IV weekly  Assessment/Plan: R pleural effusion- pulmonology consulting, CT-guided chest tube placement  4/29,  noted pleural fluid cytology negative. Completed antibiotics. Chest tube now removed.   Hypokalemia: resolved  ESRD -HD TTS on dialysis schedule, refused Thursday and shortened Saturday. Fortunately no signs of uremia or volume overload. Next HD 5/6 if he remains inpatient.   Hypertension/volume  - hypotensive when seen by EMS. BP stable now. Continue home meds. He seems to be losing wt, Will attempt to lower and optimize volume with HD. UF as tolerated.   Anemia  - HGB at 9.8,  follow labs, cytology negative so likely safe to resume ESA if needed.   Metabolic bone disease -VDRA on hold due to high calcium , continue Sensipar  and binders.  Phosphorus controlled  Nutrition - Low albumin, K+ stable. Can have regular diet with fluid restrictions, protein supplement   Jadene Maxwell, NP [facility] 04/15/2024,9:58 AM  LOS: 9 days

## 2024-04-15 NOTE — Progress Notes (Signed)
 HD#9 Subjective:   Summary: [name] is a 73 y.o. male with pertinent PMH of ESRD on HD (Tuesday Thursday Friday), A-fib on Eliquis , type 2 diabetes, and hypertension who presented following a fall and was found to have a pleural effusion and is admitted for workup and treatment of his effusion.   Overnight Events: Continued surgical site oozing per night resident, documented in separate progress note.  Patient seen today, doing well besides continued drainage from the previous chest tube site.  Denies chest pain, shortness of breath at this time.  Objective:  Vital signs in last 24 hours: Vitals:   04/14/24 2128 04/15/24 0105 04/15/24 0519 04/15/24 0626  BP: 131/65 (!) 149/66 (!) 118/90 (!) 139/54  Pulse: (!) 54 (!) 55 92 (!) 58  Resp: 16  19 20   Temp: (!) 97.5 F (36.4 C) 98 F (36.7 C) 97.9 F (36.6 C) 97.7 F (36.5 C)  TempSrc: Oral Oral Oral Oral  SpO2: 97% 100% 99% 100%  Weight:      Height:       Supplemental O2: SpO2: 100 % O2 Flow Rate (L/min): 2 L/min   Physical Exam:  Constitutional: In no acute distress Cardiovascular: Systolic murmur best heard at the apex, split S2? Pulmonary/Chest: Decreased breath sounds right base, Rales appreciated.  Some serosanguineous discharge appreciated on the chest bandaging. Abdominal: soft, non-tender, non-distended, positive bowel sounds Skin: No lower extremity edema appreciated Psych: Very pleasant affect  Filed Weights   04/10/24 2247 04/13/24 1600 04/13/24 1907  Weight: 73.8 kg 72.3 kg 70.1 kg      Intake/Output Summary (Last 24 hours) at 04/15/2024 0657 Last data filed at 04/14/2024 1800 Gross per 24 hour  Intake 175 ml  Output --  Net 175 ml   Net IO Since Admission: -4,437.67 mL [04/15/24 0657]  Pertinent Labs:    Latest Ref Rng & Units 04/15/2024    5:05 AM 04/14/2024    4:41 PM 04/14/2024   10:35 AM  CBC  WBC 4.0 - 10.5 K/uL 5.6  6.8  5.7   Hemoglobin 13.0 - 17.0 g/dL 9.4  40.9  81.1   Hematocrit  39.0 - 52.0 % 29.8  35.4  33.8   Platelets 150 - 400 K/uL 113  138  129        Latest Ref Rng & Units 04/15/2024    5:05 AM 04/13/2024    5:28 AM 04/12/2024    6:19 AM  CMP  Glucose 70 - 99 mg/dL 914  74  69   BUN 8 - 23 mg/dL 63  67  54   Creatinine 0.61 - 1.24 mg/dL 78.29  56.21  3.08   Sodium 135 - 145 mmol/L 137  138  137   Potassium 3.5 - 5.1 mmol/L 4.8  5.0  5.0   Chloride 98 - 111 mmol/L 100  100  98   CO2 22 - 32 mmol/L 26  28  26    Calcium  8.9 - 10.3 mg/dL 9.6  8.9  9.5     Assessment/Plan:   Principal Problem:   Pleural effusion Active Problems:   Stage 5 chronic kidney disease on chronic dialysis [facility])   Fall   Patient Summary: [name] is a 73 y.o. male with pertinent PMH of ESRD on HD (Tuesday Thursday Friday), A-fib on Eliquis , type 2 diabetes, and hypertension who presented following a fall and was found to have a pleural effusion and is admitted for workup and treatment of his  effusion. , on hospital day 9.   # Lymphocytic predominant exudative pleural effusion # Right lung mass vs atelectasis In brief this is a 73 year old gentleman presented following a fall and was found to have a large loculated right-sided pleural effusion. The pleural effusion was subsequently drained and shown to be exudative in nature.  Chest tube was placed x 2, and subsequently removed. Multiple services including CT surgery, pulmonology, and interventional radiology have been involved in the care of this patient.  At this time it is not clear what the underlying etiology of the this complex effusion, though underlying mass is on the differential. The patient endorses ongoing drainage from the chest tube site, night team assessed the patient for ongoing drainage and pressure bandages were reapplied.  This morning chest x-ray was obtained which showed persistent moderate loculated right pleural effusion. CBC this morning demonstrated downtrending hemoglobin and platelets, though not  largely different from baseline.  The patient denies symptoms from this. I did reach out to interventional radiology as indicated by previous CT surgery note for possible biopsy, but they stated they do not have a clear target for biopsy at this time.  They did recommend CT scan with contrast for further characterization, which we will obtain today.  I did discuss this with the patient's nephrology provider, they will go for dialysis tomorrow. Plan:  - CT chest with contrast - Consider reaching out to CT surgery/pulmonology for discharge/disposition recs following CT scan. - Monitor CBC - Holding Eliquis , ASA in setting of continued surgical site output. - Monitor bleeding from chest tube insertion site.  # ESRD (Tue/Th/ Sat) # Normocytic anemia BMP stable, continue per nephrology. Plan: - Daily BMP  #Hypothyroidism - Continue levothyroxine  50 mcg   #Hypertension - Lisinopril  40 mg. - Hydralazine  25 mg 3 times daily  #Hyperlipidemia #PVD - Continue Lipitor  80 mg daily - Holding ASA 81  Diet: Normal IVF: None,None VTE:  Holding Eliquis  Code: Full   Dispo: Anticipated discharge to Skilled nursing facility in 2 days pending clinical stability.   [name] MD Internal Medicine Resident PGY-1 Pager: [phone] Please contact the on call pager after 5 pm and on weekends at [phone].

## 2024-04-15 NOTE — Progress Notes (Signed)
 Physical Therapy Treatment Patient Details Name: [name] MRN: [id number] DOB: 01/07/1951 Today's Date: 04/15/2024   History of Present Illness [name] is a 73 year old male who presented to the ED 4/25 for evaluation after a fall.  Found to have a mild to moderate right pleural effusion. S/p Thoracentesis with IR 4/25 and R CT placed 4/29. PMhx: CKD on HD Tuesday/Thursday/Saturday; CHF, diabetes, CKD, HTN.    PT Comments  Pt received in bed, is concerned about continued drainage R chest and wants to get out of hospital. Pt able to come to EOB without assist. BLE's with less swelling than session 5/1. Pt stood from elevated surface with use of RW and min A 2x. Pt ambulated 3' to recliner with min A. Encouragement given to ambulate to hallway but pt deferred due to R chest discomfort and feelings of weakness. Patient will benefit from continued inpatient follow up therapy, <3 hours/day. PT will continue to follow.     If plan is discharge home, recommend the following: A little help with walking and/or transfers;Assistance with cooking/housework   Can travel by private vehicle     Yes  Equipment Recommendations  Rolling walker (2 wheels)    Recommendations for Other Services       Precautions / Restrictions Precautions Precautions: Fall Recall of Precautions/Restrictions: Intact Precaution/Restrictions Comments: fell PTA, BLE swelling, R knee OA Restrictions Weight Bearing Restrictions Per Provider Order: No     Mobility  Bed Mobility Overal bed mobility: Modified Independent Bed Mobility: Supine to Sit     Supine to sit: Modified independent (Device/Increase time)          Transfers Overall transfer level: Needs assistance Equipment used: Rolling walker (2 wheels) Transfers: Sit to/from Stand Sit to Stand: Min assist           General transfer comment: pt at first felt he could not stand from bed. Instructed to keep R hand on bed to keep R arm closer to  body and less stress on CT removal site. Needed min A for power up to stand. Performed 2x.    Ambulation/Gait Ambulation/Gait assistance: Min assist Gait Distance (Feet): 3 Feet Assistive device: Rolling walker (2 wheels) Gait Pattern/deviations: Decreased stride length, Trunk flexed, Decreased dorsiflexion - left, Step-to pattern Gait velocity: dec Gait velocity interpretation: <1.31 ft/sec, indicative of household ambulator   General Gait Details: ambulated bed to chair with RW with min A for safety. Encouraged to ambulate further but he reports he is too uncomfortable R chest   Stairs             Wheelchair Mobility     Tilt Bed    Modified Rankin (Stroke Patients Only)       Balance Overall balance assessment: Needs assistance Sitting-balance support: Feet supported Sitting balance-Leahy Scale: Good     Standing balance support: Single extremity supported Standing balance-Leahy Scale: Poor Standing balance comment: needs UE support for safe standing, is not steady                            Communication Communication Communication: No apparent difficulties  Cognition Arousal: Alert Behavior During Therapy: WFL for tasks assessed/performed   PT - Cognitive impairments: No apparent impairments                       PT - Cognition Comments: shows insight into limitations. Needs lots of encouragement to mobilize Following  commands: Intact      Cueing Cueing Techniques: Verbal cues  Exercises General Exercises - Lower Extremity Ankle Circles/Pumps: AROM, Both, 15 reps, Seated    General Comments General comments (skin integrity, edema, etc.): less swelling BLE's today than last session 5/1. No change in seepage R chest dressing after mobility      Pertinent Vitals/Pain Pain Assessment Pain Assessment: Faces Faces Pain Scale: Hurts a little bit Pain Location: CT site discomfort Pain Descriptors / Indicators: Discomfort Pain  Intervention(s): Monitored during session    Home Living                          Prior Function            PT Goals (current goals can now be found in the care plan section) Acute Rehab PT Goals Patient Stated Goal: Move out of [facility] PT Goal Formulation: With patient Time For Goal Achievement: 04/19/24 Potential to Achieve Goals: Good Progress towards PT goals: Progressing toward goals    Frequency    Min 1X/week      PT Plan      Co-evaluation              AM-PAC PT "6 Clicks" Mobility   Outcome Measure  Help needed turning from your back to your side while in a flat bed without using bedrails?: None Help needed moving from lying on your back to sitting on the side of a flat bed without using bedrails?: A Little Help needed moving to and from a bed to a chair (including a wheelchair)?: A Little Help needed standing up from a chair using your arms (e.g., wheelchair or bedside chair)?: A Little Help needed to walk in hospital room?: A Lot Help needed climbing 3-5 steps with a railing? : Total 6 Click Score: 16    End of Session Equipment Utilized During Treatment: Gait belt Activity Tolerance: Patient tolerated treatment well Patient left: with call bell/phone within reach;in chair;with chair alarm set Nurse Communication: Mobility status PT Visit Diagnosis: Unsteadiness on feet (R26.81);Other abnormalities of gait and mobility (R26.89);Repeated falls (R29.6);Muscle weakness (generalized) (M62.81);History of falling (Z91.81)     Time: 5409-8119 PT Time Calculation (min) (ACUTE ONLY): 16 min  Charges:    $Therapeutic Activity: 8-22 mins PT General Charges $$ ACUTE PT VISIT: 1 Visit                     [name], PT  Acute Rehab Services Secure chat preferred Office [phone]    [name] 04/15/2024, 12:00 PM

## 2024-04-16 ENCOUNTER — Telehealth: Payer: Self-pay | Admitting: Internal Medicine

## 2024-04-16 DIAGNOSIS — J9811 Atelectasis: Secondary | ICD-10-CM

## 2024-04-16 DIAGNOSIS — E875 Hyperkalemia: Secondary | ICD-10-CM

## 2024-04-16 DIAGNOSIS — Z992 Dependence on renal dialysis: Secondary | ICD-10-CM | POA: Diagnosis not present

## 2024-04-16 DIAGNOSIS — R627 Adult failure to thrive: Secondary | ICD-10-CM | POA: Diagnosis not present

## 2024-04-16 DIAGNOSIS — N186 End stage renal disease: Secondary | ICD-10-CM | POA: Diagnosis not present

## 2024-04-16 DIAGNOSIS — J90 Pleural effusion, not elsewhere classified: Secondary | ICD-10-CM | POA: Diagnosis not present

## 2024-04-16 LAB — DIFFERENTIAL
Abs Immature Granulocytes: 0.01 10*3/uL (ref 0.00–0.07)
Basophils Absolute: 0 10*3/uL (ref 0.0–0.1)
Basophils Relative: 1 %
Eosinophils Absolute: 0.5 10*3/uL (ref 0.0–0.5)
Eosinophils Relative: 7 %
Immature Granulocytes: 0 %
Lymphocytes Relative: 18 %
Lymphs Abs: 1.3 10*3/uL (ref 0.7–4.0)
Monocytes Absolute: 0.7 10*3/uL (ref 0.1–1.0)
Monocytes Relative: 9 %
Neutro Abs: 4.7 10*3/uL (ref 1.7–7.7)
Neutrophils Relative %: 65 %

## 2024-04-16 LAB — TECHNOLOGIST SMEAR REVIEW: Plt Morphology: NORMAL

## 2024-04-16 LAB — CBC
HCT: 27.4 % — ABNORMAL LOW (ref 39.0–52.0)
HCT: 28.5 % — ABNORMAL LOW (ref 39.0–52.0)
Hemoglobin: 8.6 g/dL — ABNORMAL LOW (ref 13.0–17.0)
Hemoglobin: 9.1 g/dL — ABNORMAL LOW (ref 13.0–17.0)
MCH: 29.5 pg (ref 26.0–34.0)
MCH: 29.4 pg (ref 26.0–34.0)
MCHC: 31.4 g/dL (ref 30.0–36.0)
MCHC: 31.9 g/dL (ref 30.0–36.0)
MCV: 93.8 fL (ref 80.0–100.0)
MCV: 91.9 fL (ref 80.0–100.0)
Platelets: 105 10*3/uL — ABNORMAL LOW (ref 150–400)
Platelets: 120 10*3/uL — ABNORMAL LOW (ref 150–400)
RBC: 2.92 MIL/uL — ABNORMAL LOW (ref 4.22–5.81)
RBC: 3.1 MIL/uL — ABNORMAL LOW (ref 4.22–5.81)
RDW: 16.3 % — ABNORMAL HIGH (ref 11.5–15.5)
RDW: 16.3 % — ABNORMAL HIGH (ref 11.5–15.5)
WBC: 5.4 10*3/uL (ref 4.0–10.5)
WBC: 7.1 10*3/uL (ref 4.0–10.5)
nRBC: 0 % (ref 0.0–0.2)
nRBC: 0 % (ref 0.0–0.2)

## 2024-04-16 LAB — RENAL FUNCTION PANEL
Albumin: 2.5 g/dL — ABNORMAL LOW (ref 3.5–5.0)
Anion gap: 15 (ref 5–15)
BUN: 75 mg/dL — ABNORMAL HIGH (ref 8–23)
CO2: 19 mmol/L — ABNORMAL LOW (ref 22–32)
Calcium: 8.9 mg/dL (ref 8.9–10.3)
Chloride: 103 mmol/L (ref 98–111)
Creatinine, Ser: 11.59 mg/dL — ABNORMAL HIGH (ref 0.61–1.24)
GFR, Estimated: 4 mL/min — ABNORMAL LOW (ref >60)
Glucose, Bld: 78 mg/dL (ref 70–99)
Phosphorus: 7.2 mg/dL — ABNORMAL HIGH (ref 2.5–4.6)
Potassium: 5.7 mmol/L — ABNORMAL HIGH (ref 3.5–5.1)
Sodium: 137 mmol/L (ref 135–145)

## 2024-04-16 LAB — POTASSIUM: Potassium: 5.9 mmol/L — ABNORMAL HIGH (ref 3.5–5.1)

## 2024-04-16 MED ORDER — ALTEPLASE 2 MG IJ SOLR: 2.0000 mg | Freq: Once | INTRAMUSCULAR | Status: DC | PRN

## 2024-04-16 MED ORDER — SODIUM ZIRCONIUM CYCLOSILICATE 10 G PO PACK
10.0000 g | PACK | Freq: Once | ORAL | Status: AC
  Administered 2024-04-16: 10 g via ORAL
  Filled 2024-04-16: qty 1

## 2024-04-16 MED ORDER — HEPARIN SODIUM (PORCINE) 1000 UNIT/ML DIALYSIS: 1000.0000 [IU] | INTRAMUSCULAR | Status: DC | PRN

## 2024-04-16 MED ORDER — PENTAFLUOROPROP-TETRAFLUOROETH EX AERO: 1.0000 | INHALATION_SPRAY | CUTANEOUS | Status: DC | PRN

## 2024-04-16 MED ORDER — LIDOCAINE-PRILOCAINE 2.5-2.5 % EX CREA: 1.0000 | TOPICAL_CREAM | CUTANEOUS | Status: DC | PRN

## 2024-04-16 MED ORDER — DARBEPOETIN ALFA 60 MCG/0.3ML IJ SOSY
60.0000 ug | PREFILLED_SYRINGE | INTRAMUSCULAR | Status: DC
Start: 2024-04-16 — End: 2024-04-16

## 2024-04-16 MED ORDER — CHLORHEXIDINE GLUCONATE CLOTH 2 % EX PADS: 6.0000 | MEDICATED_PAD | Freq: Every day | CUTANEOUS | Status: DC

## 2024-04-16 MED ORDER — SODIUM ZIRCONIUM CYCLOSILICATE 5 G PO PACK
5.0000 g | PACK | Freq: Once | ORAL | Status: AC
  Administered 2024-04-16: 5 g via ORAL
  Filled 2024-04-16: qty 1

## 2024-04-16 MED ORDER — LIDOCAINE HCL (PF) 1 % IJ SOLN: 5.0000 mL | INTRAMUSCULAR | Status: DC | PRN

## 2024-04-16 NOTE — Progress Notes (Addendum)
  [facility] Progress Note   Subjective:    Seen and examined patient at bedside in dialysis.  Says eating ok.  No acute issues/complaints.   Objective Vitals:   04/15/24 1732 04/15/24 2101 04/16/24 0808 04/16/24 0815  BP: (!) 154/81 (!) 133/55  (!) 148/59  Pulse: 62 (!) 58  64  Resp: 18 18  18   Temp: 97.8 F (36.6 C) 98 F (36.7 C)  98.1 F (36.7 C)  TempSrc: Oral Oral  Oral  SpO2: 98% 100%  96%  Weight:   72 kg   Height:       Physical Exam General: Alert male in NAD; RA Heart: RRR, no murmurs, rubs or gallops Lungs: normal WOB on RA Abdomen: Soft, non-distended, +BS Extremities: trace firm edema/venous stasis dermatitis Dialysis Access:  LUE AVF + t/b  Filed Weights   04/13/24 1600 04/13/24 1907 04/16/24 0808  Weight: 72.3 kg 70.1 kg 72 kg   No intake or output data in the 24 hours ending 04/16/24 0833   Additional Objective Labs: Basic Metabolic Panel: Recent Labs  Lab 04/13/24 0528 04/15/24 0505 04/16/24 0450  NA 138 137 137  K 5.0 4.8 5.7*  CL 100 100 103  CO2 28 26 19*  GLUCOSE 74 108* 78  BUN 67* 63* 75*  CREATININE 10.98* 10.75* 11.59*  CALCIUM  8.9 9.6 8.9  PHOS  --   --  7.2*   Liver Function Tests: Recent Labs  Lab 04/16/24 0450  ALBUMIN 2.5*   No results for input(s): "LIPASE", "AMYLASE" in the last 168 hours. CBC: Recent Labs  Lab 04/13/24 0528 04/14/24 1035 04/14/24 1641 04/15/24 0505 04/16/24 0450  WBC 5.7 5.7 6.8 5.6 5.4  HGB 9.8* 10.8* 11.1* 9.4* 8.6*  HCT 31.6* 33.8* 35.4* 29.8* 27.4*  MCV 92.9 92.6 93.2 91.4 93.8  PLT 120* 129* 138* 113* 105*   Blood Culture    Component Value Date/Time   SDES FLUID 04/06/2024 1346   SPECREQUEST NONE 04/06/2024 1346   CULT  04/06/2024 1346    NO GROWTH 3 DAYS Performed at [facility], [address] [place], [place] [address]    REPTSTATUS 04/10/2024 FINAL 04/06/2024 1346    Cardiac Enzymes: No results for input(s): "CKTOTAL", "CKMB", "CKMBINDEX",  "TROPONINI" in the last 168 hours. CBG: Recent Labs  Lab 04/11/24 1146  GLUCAP 110*   Iron Studies: No results for input(s): "IRON", "TIBC", "TRANSFERRIN", "FERRITIN" in the last 72 hours. Lab Results  Component Value Date   INR 1.00 07/10/2018   INR 1.15 04/12/2017   INR 1.51 03/22/2017   Studies/Results: CT CHEST W CONTRAST Result Date: 04/16/2024 CLINICAL DATA:  Pleural effusion.  Malignancy suspected. EXAM: CT CHEST WITH CONTRAST TECHNIQUE: Multidetector CT imaging of the chest was performed during intravenous contrast administration. RADIATION DOSE REDUCTION: This exam was performed according to the departmental dose-optimization program which includes automated exposure control, adjustment of the mA and/or kV according to patient size and/or use of iterative reconstruction technique. CONTRAST:  50mL OMNIPAQUE  IOHEXOL  350 MG/ML SOLN COMPARISON:  04/11/2024 FINDINGS: Cardiovascular: The heart size is mildly enlarged. Aortic atherosclerosis and coronary artery calcifications. No pericardial effusion. Mediastinum/Nodes: Thyroid  gland, trachea, and esophagus appear normal. No mediastinal adenopathy. Hilar lymph nodes are suboptimally evaluated due to lack of IV contrast. Lungs/Pleura: There is mild pleural thickening with calcifications overlying the left lung which appears unchanged from the previous study. Interval removal of the right-sided thoracostomy tube. Extensively loculated right empyema is again identified and has increased in  volume from the previous exam. New small loculated pneumothorax is noted which extends over the apex. Masslike architectural distortion within the right lower lung is again noted and is favored to represent rounded atelectasis. Areas of subsegmental liked assist and volume loss noted within the right middle lobe and posterolateral right upper lobe. Upper Abdomen: No acute abnormality. Atherosclerotic calcifications identified. Musculoskeletal: Osteopenia. No acute  fracture or dislocation. Spondylosis within the thoracic spine. IMPRESSION: 1. Interval removal of the right-sided thoracostomy tube. Extensively loculated right empyema is again identified and has increased in volume from the previous exam. New small loculated pneumothorax extends over the apex. 2. Masslike architectural distortion within the right lower lung is again noted and is favored to represent rounded atelectasis. Areas of subsegmental atelectasis and volume loss noted within the right middle lobe and posterolateral right upper lobe. 3. Coronary artery calcifications. 4.  Aortic Atherosclerosis (ICD10-I70.0). Electronically Signed   By: Kimberley Penman M.D.   On: 04/16/2024 04:49   DG CHEST PORT 1 VIEW Result Date: 04/15/2024 CLINICAL DATA:  Pleural effusion. EXAM: PORTABLE CHEST 1 VIEW COMPARISON:  04/13/2024. FINDINGS: Persistent moderate loculated right pleural fluid collection containing gas, not significantly changed compared to prior exam. Interval removal of right chest tube. Similar trace left pleural effusion. The cardiomediastinal silhouette is unchanged. IMPRESSION: 1. Persistent moderate loculated right pleural fluid collection containing gas, not significantly changed compared to prior exam. Interval removal of right chest tube. 2. Similar trace left pleural effusion. Electronically Signed   By: Mannie Seek M.D.   On: 04/15/2024 11:25    Medications:   (feeding supplement) PROSource Plus  30 mL Oral BID BM   apixaban   5 mg Oral BID   aspirin   81 mg Oral Daily   atorvastatin   80 mg Oral QHS   Chlorhexidine  Gluconate Cloth  6 each Topical Q0600   cinacalcet   30 mg Oral Q T,Th,Sat-1800   doxercalciferol   6 mcg Intravenous Q T,Th,Sa-HD   feeding supplement (NEPRO CARB STEADY)  237 mL Oral BID BM   hydrALAZINE   25 mg Oral TID   hydrocerin   Topical BID   levothyroxine   50 mcg Oral Q0600   lisinopril   40 mg Oral QHS   multivitamin  1 tablet Oral QHS   sucroferric oxyhydroxide   1,000 mg Oral TID WC    Dialysis Orders: [place], T,Th, S 4 hrs 180NRe 450/Autoflow 1.5 74.6 kg 2.0 K/ 2.0 Ca AVF - Heparin  3000 units IV initial bolus and 3000 units IV mid run - Hectorol  6 mcg IV three times per week - Sensipar  30 mg PO three times per week - Venofer  50 mg IV weekly  Assessment/Plan: R pleural effusion- pulmonology consulting, CT-guided chest tube placement  4/29,  noted pleural fluid cytology negative. Completed antibiotics. Chest tube now removed but large effusion remains. Repeat imaging 5/6 grossly abnormal - ongoing eval per primary/pulm.   ESRD -HD TTS on dialysis schedule, refused Thursday and shortened Saturday. Fortunately no signs of uremia or volume overload. HD today.  Hypertension/volume  - hypotensive when seen by EMS. BP stable now. Continue home meds. He seems to be losing wt, Will attempt to lower and optimize volume with HD. UF as tolerated.  Hyperkalemia:  K 5.7 today, 2K dialysate. Renal diet.   Anemia  - HGB at trending down to 8s today,  cytology negative but per primary notes appears high index of suspicion for underlying malignancy so will hold ESA for now.   Metabolic bone disease -VDRA on  hold due to high calcium , continue Sensipar  and binders.  Phosphorus controlled  Nutrition - Low albumin, Can have regular diet with fluid restrictions, protein supplement   Adrian Alba MD [facility] Pager [phone]

## 2024-04-16 NOTE — Progress Notes (Addendum)
 HD#10 Subjective:   Summary: [name] is a 73 y.o. male with pertinent PMH of ESRD on HD (Tuesday Thursday Friday), A-fib on Eliquis , type 2 diabetes, and hypertension who presented following a fall and was found to have a pleural effusion and is admitted for workup and treatment of his effusion.   Overnight Events: None  Feeling well overall, states his chest tube site output has decreased which he has happy about. Very slight SOB today which he states has been going on for the past several days. Discussed his effusion and next steps.   Objective:  Vital signs in last 24 hours: Vitals:   04/16/24 1019 04/16/24 1030 04/16/24 1042 04/16/24 1135  BP: (!) 126/51 (!) 136/57  (!) 156/69  Pulse: 60 62  70  Resp: 18 17  18   Temp: 98.1 F (36.7 C)     TempSrc: Oral     SpO2: 100% 100%  100%  Weight:   72 kg   Height:       Supplemental O2: SpO2: 100 % O2 Flow Rate (L/min): 2 L/min   Physical Exam:  Constitutional: In no acute distress Cardiovascular: Systolic murmur best heard at the apex Pulmonary/Chest: Decreased breath sounds right base, Rales appreciated.  Bandaging in place. Abdominal: soft, non-tender, non-distended, positive bowel sounds Skin: No lower extremity edema appreciated Psych: Very pleasant affect  Filed Weights   04/13/24 1907 04/16/24 0808 04/16/24 1042  Weight: 70.1 kg 72 kg 72 kg      Intake/Output Summary (Last 24 hours) at 04/16/2024 1509 Last data filed at 04/16/2024 1019 Gross per 24 hour  Intake 200 ml  Output -300 ml  Net 500 ml    Net IO Since Admission: -3,937.67 mL [04/16/24 1509]  Pertinent Labs:    Latest Ref Rng & Units 04/16/2024   11:30 AM 04/16/2024    4:50 AM 04/15/2024    5:05 AM  CBC  WBC 4.0 - 10.5 K/uL 7.1  5.4  5.6   Hemoglobin 13.0 - 17.0 g/dL 9.1  8.6  9.4   Hematocrit 39.0 - 52.0 % 28.5  27.4  29.8   Platelets 150 - 400 K/uL 120  105  113        Latest Ref Rng & Units 04/16/2024    4:50 AM 04/15/2024    5:05 AM  04/13/2024    5:28 AM  CMP  Glucose 70 - 99 mg/dL 78  161  74   BUN 8 - 23 mg/dL 75  63  67   Creatinine 0.61 - 1.24 mg/dL 09.60  45.40  98.11   Sodium 135 - 145 mmol/L 137  137  138   Potassium 3.5 - 5.1 mmol/L 5.7  4.8  5.0   Chloride 98 - 111 mmol/L 103  100  100   CO2 22 - 32 mmol/L 19  26  28    Calcium  8.9 - 10.3 mg/dL 8.9  9.6  8.9     Assessment/Plan:   Principal Problem:   Pleural effusion Active Problems:   Stage 5 chronic kidney disease on chronic dialysis [facility])   Fall   Patient Summary: [name] is a 73 y.o. male with pertinent PMH of ESRD on HD (Tuesday Thursday Friday), A-fib on Eliquis , type 2 diabetes, and hypertension who presented following a fall and was found to have a pleural effusion and is admitted for workup and treatment of his effusion. , on hospital day 10.   # Lymphocytic predominant, exudative pleural  effusion In brief this is a 73 year old gentleman presented following a fall and was found to have a large loculated right-sided pleural effusion. The pleural effusion was subsequently drained and shown to be exudative in nature.  Chest tube was placed x 2, and subsequently removed. Multiple services including CT surgery, pulmonology, and interventional radiology have been involved in the care of this patient.  At this time it is not clear what the underlying etiology of the this complex effusion. Underlying mass and malignancy have been on the differential, however negative cytology x2 and updated CT favoring rounded atelectasis. I did reach out to interventional radiology as indicated by previous CT surgery note for possible biopsy, but they stated they do not have a clear target for biopsy at this time.  They did recommend CT scan with contrast for further characterization, which demonstrated extensive loculated right effusion with increased volume from previous exam and new small loculated pneumothorax over the apex.  There is masslike architectural  distortion with lower lobe which radiology favored to represent rounded atelectasis.  CBC again down drifting overnight, trend 11.1 => 9.4 => 8.6 over the last 3 days. Plan:  - Will reach out to Pulmonology for re-evaluation and discharge/disposition recs given CT results - Monitor CBC - Resume Eliquis , ASA as prior chest tube site output diminished. - Monitor bleeding from chest tube insertion site.  # ESRD (Tue/Th/ Sat) # Normocytic anemia #Hyperkalemia RFP demonstrating increased phosphorus, potassium was scheduled for dialysis today, but had issues with access. Received only minimal dialysis. Continue dialysis per nephrology. Plan: - [place]   - Daily RFP -Tentatively will attempt dialysis again tomorrow - EKG obtained, no signs of changes of hyperkalemia - Afternoon potassium lab ordered, will follow-up  #Hypothyroidism - Continue levothyroxine  50 mcg   #Hypertension - Lisinopril  40 mg. - Hydralazine  25 mg 3 times daily  #Hyperlipidemia #PVD - Continue Lipitor  80 mg daily - Holding ASA 81  Diet: Normal IVF: None,None VTE:  Eliquis  Code: Full   Dispo: Anticipated discharge to Skilled nursing facility in 1 days pending clinical stability.   [name] MD Internal Medicine Resident PGY-1 Pager: [phone] Please contact the on call pager after 5 pm and on weekends at [phone].

## 2024-04-16 NOTE — Progress Notes (Signed)
 Received patient in bed to unit.  Alert and oriented.  Informed consent signed and in chart.   TX duration:  less than 10 minutes.  Patient's machine had a blood leak and we had to change machines.  The process of getting a new machine and setting up a new one took about an hour and when patient was set up with new machine, his arterial access had high pressures.  Patient was not happy and wanted to come off now.  Patient requested to run dialysis again tomorrow and not tonite.  AMA paperwork signed and informed Dr. Higinio Love and PA Alva Jewels.  Told Nephrology team that to make an order for dialysis tomorrow and to try to work on high potassium in another way right now.   Transported back to the room  Alert, without acute distress.  Hand-off given to patient's nurse.   Access used: Right AV forearm fistula Access issues: see note above  Total UF removed: 0mL Medication(s) given: none   04/16/24 1019  Vitals  Temp 98.1 F (36.7 C)  Temp Source Oral  BP (!) 126/51  Oxygen Therapy  SpO2 100 %  O2 Device Room Air  During Treatment Monitoring  HD Safety Checks Performed Yes  Intra-Hemodialysis Comments See progress note (Terminated due to patient's impatience and access not working.  Wants to run tomorrow.)  Dialysis Fluid Bolus Normal Saline  Bolus Amount (mL) 300 mL  Post Treatment  Dialyzer Clearance Clear  Liters Processed 2.3  Fluid Removed (mL) -300 mL  Tolerated HD Treatment No (Comment)  Post-Hemodialysis Comments see note  AVG/AVF Arterial Site Held (minutes) 5 minutes  AVG/AVF Venous Site Held (minutes) 5 minutes  Fistula / Graft Left Forearm  No Placement Date or Time found.   Orientation: Left  Access Location: Forearm  Status Deaccessed     Luciano Ruths LPN [facility]

## 2024-04-16 NOTE — Progress Notes (Signed)
   04/16/24 1019  Vitals  Temp 98.1 F (36.7 C)  Temp Source Oral  BP (!) 126/51  Pulse Rate 60  ECG Heart Rate 60  Resp 18  Oxygen Therapy  SpO2 100 %  O2 Device Room Air  During Treatment Monitoring  HD Safety Checks Performed Yes  Intra-Hemodialysis Comments See progress note (Terminated due to patient's impatience and access not working.  Wants to run tomorrow.)  Dialysis Fluid Bolus Normal Saline  Bolus Amount (mL) 300 mL  Post Treatment  Dialyzer Clearance Clear  Liters Processed 2.3  Fluid Removed (mL) -300 mL  Tolerated HD Treatment No (Comment)  Post-Hemodialysis Comments see note  AVG/AVF Arterial Site Held (minutes) 5 minutes  AVG/AVF Venous Site Held (minutes) 5 minutes  Fistula / Graft Left Forearm  No Placement Date or Time found.   Orientation: Left  Access Location: Forearm  Status Deaccessed

## 2024-04-16 NOTE — Progress Notes (Signed)
 Pt refused lab draw for the K, RN encouraged the pt since pt had not completed the HD treatment, however pt continues to refused. MD was notified.

## 2024-04-16 NOTE — Progress Notes (Addendum)
 04/16/2024  Called for enlarging fluid and some air in R chest.  Complex pleural space and history reviewed.   S:  Has some serous oozing from pigtail site. Breathing is baseline, dyspneic only on exertion.   O: Blood pressure 96/66, pulse 64, temperature 97.6 F (36.4 C), temperature source Oral, resp. rate 17, height 5\' 3"  (1.6 m), weight 81.6 kg, SpO2 94 %.    Chronically ill appearing Extensive calciphylaxis noted Moves to command Severely reduced breath sounds on R Global edema   A:  Complex R pleural space with inflammation on path Rounded atelectasis on R vs.less likely lung mass FTT, bedbound, large degree of weight loss over past few years ESRD PVD, prior amputations noted   P:  Agree with dressings as ordered for pigtail site, should ease up over time.  Another chest tube is not warranted given lack of symptoms at present and chronicity of effusion unlikely to expand lung  Only option if gets symptomatic would be high risk tube + pleural lytics + eventual pleurX since entrapped lung  Not a surgical candidate or even a good pleuroscopy candidate; given baseline frailty.  Should this end up being malignancy it would be terminal and he would not be a candidate for any sort of antineoplastic treatment  Recommend we leave the space alone and monitor on imaging and exam; will tentatively set up in office in 2 weeks.  We will be available PRN, please reach out if any questions or concerns.  Patient and primary updated on plan.    Ardelle Kos MD [facility] Prefer epic messenger for cross cover needs If after hours, please call E-link

## 2024-04-16 NOTE — Telephone Encounter (Signed)
 2-3 week f/u with CXR with ND or any APP thank you

## 2024-04-17 DIAGNOSIS — E875 Hyperkalemia: Secondary | ICD-10-CM | POA: Diagnosis not present

## 2024-04-17 DIAGNOSIS — N186 End stage renal disease: Secondary | ICD-10-CM | POA: Diagnosis not present

## 2024-04-17 DIAGNOSIS — Z992 Dependence on renal dialysis: Secondary | ICD-10-CM | POA: Diagnosis not present

## 2024-04-17 DIAGNOSIS — J90 Pleural effusion, not elsewhere classified: Secondary | ICD-10-CM | POA: Diagnosis not present

## 2024-04-17 LAB — BASIC METABOLIC PANEL WITH GFR
Anion gap: 10 (ref 5–15)
BUN: 59 mg/dL — ABNORMAL HIGH (ref 8–23)
CO2: 28 mmol/L (ref 22–32)
Calcium: 8.4 mg/dL — ABNORMAL LOW (ref 8.9–10.3)
Chloride: 98 mmol/L (ref 98–111)
Creatinine, Ser: 9.4 mg/dL — ABNORMAL HIGH (ref 0.61–1.24)
GFR, Estimated: 5 mL/min — ABNORMAL LOW (ref >60)
Glucose, Bld: 100 mg/dL — ABNORMAL HIGH (ref 70–99)
Potassium: 4.6 mmol/L (ref 3.5–5.1)
Sodium: 136 mmol/L (ref 135–145)

## 2024-04-17 LAB — RENAL FUNCTION PANEL
Albumin: 2.6 g/dL — ABNORMAL LOW (ref 3.5–5.0)
Anion gap: 13 (ref 5–15)
BUN: 89 mg/dL — ABNORMAL HIGH (ref 8–23)
CO2: 23 mmol/L (ref 22–32)
Calcium: 8.7 mg/dL — ABNORMAL LOW (ref 8.9–10.3)
Chloride: 101 mmol/L (ref 98–111)
Creatinine, Ser: 13.26 mg/dL — ABNORMAL HIGH (ref 0.61–1.24)
GFR, Estimated: 4 mL/min — ABNORMAL LOW (ref >60)
Glucose, Bld: 83 mg/dL (ref 70–99)
Phosphorus: 7.8 mg/dL — ABNORMAL HIGH (ref 2.5–4.6)
Potassium: 6.1 mmol/L — ABNORMAL HIGH (ref 3.5–5.1)
Sodium: 137 mmol/L (ref 135–145)

## 2024-04-17 LAB — MISC LABCORP TEST (SEND OUT): Labcorp test code: 9985

## 2024-04-17 LAB — CBC
HCT: 25.5 % — ABNORMAL LOW (ref 39.0–52.0)
Hemoglobin: 8.2 g/dL — ABNORMAL LOW (ref 13.0–17.0)
MCH: 29.6 pg (ref 26.0–34.0)
MCHC: 32.2 g/dL (ref 30.0–36.0)
MCV: 92.1 fL (ref 80.0–100.0)
Platelets: 122 10*3/uL — ABNORMAL LOW (ref 150–400)
RBC: 2.77 MIL/uL — ABNORMAL LOW (ref 4.22–5.81)
RDW: 16.2 % — ABNORMAL HIGH (ref 11.5–15.5)
WBC: 6.2 10*3/uL (ref 4.0–10.5)
nRBC: 0 % (ref 0.0–0.2)

## 2024-04-17 MED ORDER — LIDOCAINE-PRILOCAINE 2.5-2.5 % EX CREA: 1.0000 | TOPICAL_CREAM | CUTANEOUS | Status: DC | PRN

## 2024-04-17 MED ORDER — DOXERCALCIFEROL 4 MCG/2ML IV SOLN
INTRAVENOUS | Status: AC
  Filled 2024-04-17: qty 4

## 2024-04-17 MED ORDER — HEPARIN SODIUM (PORCINE) 1000 UNIT/ML IJ SOLN
3000.0000 [IU] | Freq: Once | INTRAMUSCULAR | Status: AC
  Administered 2024-04-17: 3000 [IU] via INTRAVENOUS
  Filled 2024-04-17: qty 3

## 2024-04-17 MED ORDER — SODIUM ZIRCONIUM CYCLOSILICATE 10 G PO PACK
10.0000 g | PACK | Freq: Once | ORAL | Status: AC
  Administered 2024-04-17: 10 g via ORAL
  Filled 2024-04-17: qty 1

## 2024-04-17 MED ORDER — HEPARIN SODIUM (PORCINE) 1000 UNIT/ML DIALYSIS: 1000.0000 [IU] | INTRAMUSCULAR | Status: DC | PRN

## 2024-04-17 MED ORDER — HEPARIN SODIUM (PORCINE) 1000 UNIT/ML IJ SOLN
INTRAMUSCULAR | Status: AC
  Filled 2024-04-17: qty 6

## 2024-04-17 MED ORDER — ALTEPLASE 2 MG IJ SOLR: 2.0000 mg | Freq: Once | INTRAMUSCULAR | Status: DC | PRN

## 2024-04-17 MED ORDER — PENTAFLUOROPROP-TETRAFLUOROETH EX AERO: 1.0000 | INHALATION_SPRAY | CUTANEOUS | Status: DC | PRN

## 2024-04-17 MED ORDER — LIDOCAINE HCL (PF) 1 % IJ SOLN: 5.0000 mL | INTRAMUSCULAR | Status: DC | PRN

## 2024-04-17 MED ORDER — CALCIUM GLUCONATE-NACL 1-0.675 GM/50ML-% IV SOLN
1.0000 g | Freq: Once | INTRAVENOUS | Status: AC
  Administered 2024-04-17: 1000 mg via INTRAVENOUS
  Filled 2024-04-17: qty 50

## 2024-04-17 NOTE — Plan of Care (Signed)
   Problem: Clinical Measurements: Goal: Will remain free from infection Outcome: Progressing Goal: Diagnostic test results will improve Outcome: Progressing

## 2024-04-17 NOTE — Plan of Care (Signed)
  Problem: Health Behavior/Discharge Planning: Goal: Ability to manage health-related needs will improve Outcome: Progressing   Problem: Clinical Measurements: Goal: Ability to maintain clinical measurements within normal limits will improve Outcome: Progressing Goal: Will remain free from infection Outcome: Progressing Goal: Diagnostic test results will improve Outcome: Progressing Goal: Respiratory complications will improve Outcome: Progressing Goal: Cardiovascular complication will be avoided Outcome: Progressing   Problem: Activity: Goal: Risk for activity intolerance will decrease Outcome: Progressing   Problem: Nutrition: Goal: Adequate nutrition will be maintained Outcome: Progressing   Problem: Coping: Goal: Level of anxiety will decrease Outcome: Progressing   Problem: Elimination: Goal: Will not experience complications related to bowel motility Outcome: Progressing Goal: Will not experience complications related to urinary retention Outcome: Progressing   Problem: Pain Managment: Goal: General experience of comfort will improve and/or be controlled Outcome: Progressing   Problem: Safety: Goal: Ability to remain free from injury will improve Outcome: Progressing   Problem: Skin Integrity: Goal: Risk for impaired skin integrity will decrease Outcome: Progressing   Problem: Education: Goal: Knowledge of disease and its progression will improve Outcome: Progressing   Problem: Fluid Volume: Goal: Compliance with measures to maintain balanced fluid volume will improve Outcome: Progressing   Problem: Health Behavior/Discharge Planning: Goal: Ability to manage health-related needs will improve Outcome: Progressing   Problem: Nutritional: Goal: Ability to make healthy dietary choices will improve Outcome: Progressing   Problem: Clinical Measurements: Goal: Complications related to the disease process, condition or treatment will be avoided or  minimized Outcome: Progressing

## 2024-04-17 NOTE — Progress Notes (Addendum)
 [name] Progress Note   Subjective:    Seen and examined patient at bedside in dialysis.  Running fine today.   Says eating ok.  No new issues/complaints.    Objective Vitals:   04/17/24 0801 04/17/24 0805 04/17/24 0815 04/17/24 0830  BP:  (!) 150/60 (!) 139/57 (!) 135/54  Pulse:  75 (!) 54 (!) 55  Resp:  17 (!) 21 19  Temp:  98.1 F (36.7 C)    TempSrc:  Oral    SpO2:  97% 97% 97%  Weight: 73.3 kg     Height:       Physical Exam General: Alert male in NAD; RA Heart: RRR, no murmurs, rubs or gallops Lungs: normal WOB on RA Abdomen: Soft, non-distended, +BS Extremities: trace firm edema/venous stasis dermatitis Dialysis Access:  LUE AVF + t/b  Filed Weights   04/16/24 1042 04/16/24 2027 04/17/24 0801  Weight: 72 kg 73.8 kg 73.3 kg    Intake/Output Summary (Last 24 hours) at 04/17/2024 0846 Last data filed at 04/17/2024 0600 Gross per 24 hour  Intake 590 ml  Output -300 ml  Net 890 ml     Additional Objective Labs: Basic Metabolic Panel: Recent Labs  Lab 04/15/24 0505 04/16/24 0450 04/16/24 1837 04/17/24 0454  NA 137 137  --  137  K 4.8 5.7* 5.9* 6.1*  CL 100 103  --  101  CO2 26 19*  --  23  GLUCOSE 108* 78  --  83  BUN 63* 75*  --  89*  CREATININE 10.75* 11.59*  --  13.26*  CALCIUM  9.6 8.9  --  8.7*  PHOS  --  7.2*  --  7.8*   Liver Function Tests: Recent Labs  Lab 04/16/24 0450 04/17/24 0454  ALBUMIN 2.5* 2.6*   No results for input(s): "LIPASE", "AMYLASE" in the last 168 hours. CBC: Recent Labs  Lab 04/14/24 1641 04/15/24 0505 04/16/24 0450 04/16/24 1130 04/17/24 0454  WBC 6.8 5.6 5.4 7.1 6.2  NEUTROABS  --   --   --  4.7  --   HGB 11.1* 9.4* 8.6* 9.1* 8.2*  HCT 35.4* 29.8* 27.4* 28.5* 25.5*  MCV 93.2 91.4 93.8 91.9 92.1  PLT 138* 113* 105* 120* 122*   Blood Culture    Component Value Date/Time   SDES FLUID 04/06/2024 1346   SPECREQUEST NONE 04/06/2024 1346   CULT  04/06/2024 1346    NO GROWTH 3  DAYS Performed at [facility], [address] [place], [place] [address]    REPTSTATUS 04/10/2024 FINAL 04/06/2024 1346    Cardiac Enzymes: No results for input(s): "CKTOTAL", "CKMB", "CKMBINDEX", "TROPONINI" in the last 168 hours. CBG: Recent Labs  Lab 04/11/24 1146  GLUCAP 110*   Iron Studies: No results for input(s): "IRON", "TIBC", "TRANSFERRIN", "FERRITIN" in the last 72 hours. Lab Results  Component Value Date   INR 1.00 07/10/2018   INR 1.15 04/12/2017   INR 1.51 03/22/2017   Studies/Results: CT CHEST W CONTRAST Result Date: 04/16/2024 CLINICAL DATA:  Pleural effusion.  Malignancy suspected. EXAM: CT CHEST WITH CONTRAST TECHNIQUE: Multidetector CT imaging of the chest was performed during intravenous contrast administration. RADIATION DOSE REDUCTION: This exam was performed according to the departmental dose-optimization program which includes automated exposure control, adjustment of the mA and/or kV according to patient size and/or use of iterative reconstruction technique. CONTRAST:  50mL OMNIPAQUE  IOHEXOL  350 MG/ML SOLN COMPARISON:  04/11/2024 FINDINGS: Cardiovascular: The heart size is mildly enlarged. Aortic atherosclerosis and  coronary artery calcifications. No pericardial effusion. Mediastinum/Nodes: Thyroid  gland, trachea, and esophagus appear normal. No mediastinal adenopathy. Hilar lymph nodes are suboptimally evaluated due to lack of IV contrast. Lungs/Pleura: There is mild pleural thickening with calcifications overlying the left lung which appears unchanged from the previous study. Interval removal of the right-sided thoracostomy tube. Extensively loculated right empyema is again identified and has increased in volume from the previous exam. New small loculated pneumothorax is noted which extends over the apex. Masslike architectural distortion within the right lower lung is again noted and is favored to represent rounded atelectasis. Areas of subsegmental liked  assist and volume loss noted within the right middle lobe and posterolateral right upper lobe. Upper Abdomen: No acute abnormality. Atherosclerotic calcifications identified. Musculoskeletal: Osteopenia. No acute fracture or dislocation. Spondylosis within the thoracic spine. IMPRESSION: 1. Interval removal of the right-sided thoracostomy tube. Extensively loculated right empyema is again identified and has increased in volume from the previous exam. New small loculated pneumothorax extends over the apex. 2. Masslike architectural distortion within the right lower lung is again noted and is favored to represent rounded atelectasis. Areas of subsegmental atelectasis and volume loss noted within the right middle lobe and posterolateral right upper lobe. 3. Coronary artery calcifications. 4.  Aortic Atherosclerosis (ICD10-I70.0). Electronically Signed   By: Kimberley Penman M.D.   On: 04/16/2024 04:49    Medications:  calcium  gluconate      (feeding supplement) PROSource Plus  30 mL Oral BID BM   apixaban   5 mg Oral BID   aspirin   81 mg Oral Daily   atorvastatin   80 mg Oral QHS   Chlorhexidine  Gluconate Cloth  6 each Topical Q0600   cinacalcet   30 mg Oral Q T,Th,Sat-1800   doxercalciferol   6 mcg Intravenous Q T,Th,Sa-HD   feeding supplement (NEPRO CARB STEADY)  237 mL Oral BID BM   heparin  sodium (porcine)  3,000 Units Intravenous Once   hydrALAZINE   25 mg Oral TID   hydrocerin   Topical BID   levothyroxine   50 mcg Oral Q0600   lisinopril   40 mg Oral QHS   multivitamin  1 tablet Oral QHS   sucroferric oxyhydroxide  1,000 mg Oral TID WC    Dialysis Orders: [place], T,Th, S 4 hrs 180NRe 450/Autoflow 1.5 74.6 kg 2.0 K/ 2.0 Ca AVF - Heparin  3000 units IV initial bolus and 3000 units IV mid run - Hectorol  6 mcg IV three times per week - Sensipar  30 mg PO three times per week - Venofer  50 mg IV weekly  Assessment/Plan: R pleural effusion- pulmonology consulting, CT-guided chest tube placement   4/29,  noted pleural fluid cytology negative. Completed antibiotics. Chest tube now removed but large effusion remains. Repeat imaging 5/6 grossly abnormal - ongoing eval per primary/pulm. Appears outpt f/u being arranged.   ESRD -HD TTS on dialysis schedule, refused Thursday and shortened Saturday. Was on for Tues but had a few issues related to machine and due to frustration did not receive tx, running fine today.  Wishes to resume usual schedule tomorrow, HD ordered.  Hypertension/volume  - hypotensive when seen by EMS. BP stable now. Continue home meds. He seems to be losing wt, Will attempt to lower and optimize volume with HD. UF as tolerated.  Hyperkalemia:  will improve with HD  Anemia  - HGB at trending down to 8s today,  cytology negative but per primary notes appears high index of suspicion for underlying malignancy so will hold ESA for now until that's  further clarified.  Metabolic bone disease -VDRA on hold due to high calcium , continue Sensipar  and binders.  Phosphorus controlled  Nutrition - Low albumin, Can have regular diet with fluid restrictions, protein supplement  Ok for d/c from my perspective, if remains admitted will do HD here tomorrow.   Adrian Alba MD [facility] Pager [phone]

## 2024-04-17 NOTE — Progress Notes (Signed)
   04/17/24 1032  Vitals  Temp 98 F (36.7 C)  Temp Source Oral  BP 98/60  MAP (mmHg) 72  Pulse Rate 60  ECG Heart Rate 61  Resp (!) 23  Oxygen Therapy  SpO2 100 %  O2 Device Room Air  Patient Activity (if Appropriate) In bed  Pulse Oximetry Type Continuous  During Treatment Monitoring  Duration of HD Treatment -hour(s) 2.17 hour(s) (2 hours and 12 minutes)  Cumulative Fluid Removed (mL) per Treatment  1405.5  HD Safety Checks Performed Yes  Intra-Hemodialysis Comments See progress note (Patient terminated treatment even with education on his high potassium.  He signed AMA paperwork, PA Anderson notified.)  Dialysis Fluid Bolus Normal Saline  Bolus Amount (mL) 300 mL  Post Treatment  Dialyzer Clearance Clear  Liters Processed 52.9  Fluid Removed (mL) 1400 mL  Tolerated HD Treatment No (Comment)  Post-Hemodialysis Comments see notes  AVG/AVF Arterial Site Held (minutes) 7 minutes  AVG/AVF Venous Site Held (minutes) 7 minutes  Fistula / Graft Left Forearm  No Placement Date or Time found.   Orientation: Left  Access Location: Forearm  Status Deaccessed

## 2024-04-17 NOTE — Progress Notes (Addendum)
 HD#11 Subjective:   Summary: [name] is a 73 y.o. male with pertinent PMH of ESRD on HD (Tuesday Thursday Friday), A-fib on Eliquis , type 2 diabetes, and hypertension who presented following a fall and was found to have a pleural effusion and is admitted for workup and treatment of his effusion.   Overnight Events: Potassium continued to rise  Feeling well overall, no new chest tube site output or worsening shortness of breath.  Discussed importance of dialysis as well as next steps in continued evaluation of his pleural effusion.  Objective:  Vital signs in last 24 hours: Vitals:   04/17/24 1032 04/17/24 1037 04/17/24 1043 04/17/24 1058  BP: 98/60 (!) 114/54  139/78  Pulse: 60 (!) 58  (!) 57  Resp: (!) 23 20  18   Temp: 98 F (36.7 C)   98.3 F (36.8 C)  TempSrc: Oral   Oral  SpO2: 100% 100%  98%  Weight:   71.9 kg   Height:       Supplemental O2: SpO2: 98 % O2 Flow Rate (L/min): 2 L/min   Physical Exam:  Constitutional: In no acute distress Cardiovascular: Systolic murmur best heard at the apex Pulmonary/Chest: Decreased breath sounds right base, Rales appreciated.  Clean bandaging in place. Abdominal: soft, non-tender, non-distended, positive bowel sounds Skin: No lower extremity edema appreciated Psych: Very pleasant affect  Filed Weights   04/16/24 2027 04/17/24 0801 04/17/24 1043  Weight: 73.8 kg 73.3 kg 71.9 kg      Intake/Output Summary (Last 24 hours) at 04/17/2024 1125 Last data filed at 04/17/2024 1032 Gross per 24 hour  Intake 590 ml  Output 1400 ml  Net -810 ml    Net IO Since Admission: -4,747.67 mL [04/17/24 1125]  Pertinent Labs:    Latest Ref Rng & Units 04/17/2024    4:54 AM 04/16/2024   11:30 AM 04/16/2024    4:50 AM  CBC  WBC 4.0 - 10.5 K/uL 6.2  7.1  5.4   Hemoglobin 13.0 - 17.0 g/dL 8.2  9.1  8.6   Hematocrit 39.0 - 52.0 % 25.5  28.5  27.4   Platelets 150 - 400 K/uL 122  120  105        Latest Ref Rng & Units 04/17/2024     4:54 AM 04/16/2024    6:37 PM 04/16/2024    4:50 AM  CMP  Glucose 70 - 99 mg/dL 83   78   BUN 8 - 23 mg/dL 89   75   Creatinine 8.11 - 1.24 mg/dL 91.47   82.95   Sodium 135 - 145 mmol/L 137   137   Potassium 3.5 - 5.1 mmol/L 6.1  5.9  5.7   Chloride 98 - 111 mmol/L 101   103   CO2 22 - 32 mmol/L 23   19   Calcium  8.9 - 10.3 mg/dL 8.7   8.9     Assessment/Plan:   Principal Problem:   Pleural effusion Active Problems:   Stage 5 chronic kidney disease on chronic dialysis [facility])   Fall   Patient Summary: [name] is a 73 y.o. male with pertinent PMH of ESRD on HD (Tuesday Thursday Friday), A-fib on Eliquis , type 2 diabetes, and hypertension who presented following a fall and was found to have a pleural effusion and is admitted for workup and treatment of his effusion, on hospital day 11.  # Lymphocytic predominant, exudative pleural effusion In brief this is a 73 year old gentleman presented  following a fall and was found to have a large loculated right-sided pleural effusion. The pleural effusion was subsequently drained and shown to be exudative in nature.  Chest tube was placed x 2, and subsequently removed. Multiple services including CT surgery, pulmonology, and interventional radiology have been involved in the care of this patient.  At this time it is not clear what the underlying etiology of the this complex effusion. Underlying mass and malignancy have been on the differential, however negative cytology x2 and updated CT favoring rounded atelectasis.  Kindly seen by PCCM yesterday who recommended observation of this asymptomatic effusion.  He is not a candidate for more aggressive interventions per consulting services.  Will continue to follow with pulmonology outpatient for continued evaluation of this effusion.  No new surgical site output since removal of chest tube, reinitiation of anticoagulation.  No worsening shortness of breath.  CBC relatively stable. Plan:  - Patient  scheduled to follow-up with pulmonology 6/5 - Monitor CBC - Continue Eliquis , ASA as prior chest tube site output diminished. - Monitor bleeding from chest tube insertion site.  # ESRD (Tue/Th/ Sat) # Normocytic anemia #Hyperkalemia Potassium continued to rise yesterday despite Lokelma .  EKG with some PR interval prolongation.  Extensive discussion today regarding importance of dialysis, sadly I do see documentation that patient left dialysis AMA after about 2 hours and 15 minutes of treatment.  He is scheduled for repeat dialysis session tomorrow, and can likely discharge following this. Plan: - Afternoon BMP -Tentatively will attempt dialysis again tomorrow  #Hypothyroidism - Continue levothyroxine  50 mcg   #Hypertension - Lisinopril  40 mg. - Hydralazine  25 mg 3 times daily  #Hyperlipidemia #PVD - Continue Lipitor  80 mg daily - ASA 81  Diet: Renal IVF: None,None VTE:  Eliquis  Code: Full   Dispo: Anticipated discharge to Skilled nursing facility in 1 days pending clinical stability.   [name] MD Internal Medicine Resident PGY-1 Pager: [phone] Please contact the on call pager after 5 pm and on weekends at [phone].

## 2024-04-17 NOTE — Progress Notes (Addendum)
 Received patient in bed to unit.  Alert and oriented.  Informed consent signed and in chart.   TX duration: 2 hours and 12 minutes  Patient did not want to be on machine any longer, and explained to patient that he needs dialysis due to high potassium and the effects of high potassium.  He refused and was angry with me.  He signed AMA paperwork.  NP Alva Jewels and Dr. Higinio Love (Nephrology team) informed.  Transported back to the room  Alert, without acute distress.  Hand-off given to patient's nurse.   Access used: Left AV Fistula Access issues: hectoral  Total UF removed: 1.4L Medication(s) given: none   04/17/24 1032  Vitals  Temp 98 F (36.7 C)  Temp Source Oral  BP 98/60  MAP (mmHg) 72  Pulse Rate 60  ECG Heart Rate 61  Resp (!) 23  MEWS COLOR  MEWS Score Color Yellow  Oxygen Therapy  SpO2 100 %  O2 Device Room Air  Patient Activity (if Appropriate) In bed  Pulse Oximetry Type Continuous  MEWS Score  MEWS Temp 0  MEWS Systolic 1  MEWS Pulse 0  MEWS RR 1  MEWS LOC 0  MEWS Score 2     Luciano Ruths LPN [facility]

## 2024-04-18 DIAGNOSIS — N186 End stage renal disease: Secondary | ICD-10-CM | POA: Diagnosis not present

## 2024-04-18 DIAGNOSIS — W19XXXA Unspecified fall, initial encounter: Secondary | ICD-10-CM | POA: Diagnosis not present

## 2024-04-18 DIAGNOSIS — Z992 Dependence on renal dialysis: Secondary | ICD-10-CM | POA: Diagnosis not present

## 2024-04-18 DIAGNOSIS — J90 Pleural effusion, not elsewhere classified: Secondary | ICD-10-CM | POA: Diagnosis not present

## 2024-04-18 LAB — RENAL FUNCTION PANEL
Albumin: 2.5 g/dL — ABNORMAL LOW (ref 3.5–5.0)
Anion gap: 12 (ref 5–15)
BUN: 61 mg/dL — ABNORMAL HIGH (ref 8–23)
CO2: 27 mmol/L (ref 22–32)
Calcium: 8.7 mg/dL — ABNORMAL LOW (ref 8.9–10.3)
Chloride: 97 mmol/L — ABNORMAL LOW (ref 98–111)
Creatinine, Ser: 9.93 mg/dL — ABNORMAL HIGH (ref 0.61–1.24)
GFR, Estimated: 5 mL/min — ABNORMAL LOW (ref >60)
Glucose, Bld: 79 mg/dL (ref 70–99)
Phosphorus: 6.7 mg/dL — ABNORMAL HIGH (ref 2.5–4.6)
Potassium: 5 mmol/L (ref 3.5–5.1)
Sodium: 136 mmol/L (ref 135–145)

## 2024-04-18 LAB — CBC
HCT: 26 % — ABNORMAL LOW (ref 39.0–52.0)
Hemoglobin: 8.3 g/dL — ABNORMAL LOW (ref 13.0–17.0)
MCH: 29.6 pg (ref 26.0–34.0)
MCHC: 31.9 g/dL (ref 30.0–36.0)
MCV: 92.9 fL (ref 80.0–100.0)
Platelets: 117 10*3/uL — ABNORMAL LOW (ref 150–400)
RBC: 2.8 MIL/uL — ABNORMAL LOW (ref 4.22–5.81)
RDW: 16.2 % — ABNORMAL HIGH (ref 11.5–15.5)
WBC: 5.1 10*3/uL (ref 4.0–10.5)
nRBC: 0 % (ref 0.0–0.2)

## 2024-04-18 MED ORDER — HYDRALAZINE HCL 25 MG PO TABS: 25.0000 mg | ORAL_TABLET | Freq: Three times a day (TID) | ORAL | Status: DC

## 2024-04-18 MED ORDER — CINACALCET HCL 30 MG PO TABS
30.0000 mg | ORAL_TABLET | ORAL | Status: DC
Start: 2024-04-18 — End: 2024-05-20

## 2024-04-18 NOTE — Discharge Instructions (Addendum)
 You were hospitalized for a fall and fluid around your lung. Thank you for allowing us  to be part of your care.   We arranged for you to follow up at:   [facility] -  pulmonology on 05/16/2024 at 11:30 AM with Eulas Hick, NP.   Please START taking:  Cinacalcet  30 mg total every Tuesday, Thursday, and Saturday at 6 PM  Please STOP taking:  Metoprolol  25 mg daily until otherwise instructed by your doctor

## 2024-04-18 NOTE — NC FL2 (Signed)
 [place]  MEDICAID FL2 LEVEL OF CARE FORM     IDENTIFICATION  Patient Name: [name] Birthdate: 1951-03-25 Sex: male Admission Date (Current Location): 04/05/2024  [facility] and [place] Number:  Producer, television/film/video and Address:  [facility], [address], [place], [place] [address]      Provider Number: [id number]  Attending Physician Name and Address:  Cherylene Corrente, MD  Relative Name and Phone Number:       Current Level of Care: Hospital Recommended Level of Care: Skilled Nursing Facility Prior Approval Number:    Date Approved/Denied:   PASRR Number: [id number]  Discharge Plan: SNF    Current Diagnoses: Patient Active Problem List   Diagnosis Date Noted   Fall 04/07/2024   Pleural effusion 04/05/2024   Onychomycosis of multiple toenails with type 2 diabetes mellitus and peripheral neuropathy (HCC) 06/19/2023   Pre-ulcerative calluses 06/19/2023   Persistent atrial fibrillation (HCC) 10/28/2021   Elevated troponin 10/28/2021   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, left leg (HCC) 06/28/2021   Sepsis due to cellulitis (HCC) 06/14/2021   Cellulitis of foot, left    Hypercalcemia 06/02/2020   Other disorders of phosphorus metabolism 01/04/2019   Venous stasis    History of endocarditis    Bradycardia    Orthostasis    Chest pain 09/10/2018   Hypovolemia associated with hemodialysis 07/11/2018   Weakness    Thrombocytopenia (HCC) 07/10/2018   Moderate protein-calorie malnutrition (HCC) 05/12/2017   Balanitis 04/25/2017   Penile swelling    Scrotal swelling    UTI (urinary tract infection) 04/13/2017   Anemia of chronic disease    Acute encephalopathy 04/12/2017   Urinary retention 04/10/2017   Gram-negative sepsis, unspecified (HCC) 03/29/2017   Bacteremia    RUQ abdominal pain    Gross hematuria    Other pancytopenia (HCC)    Sepsis (HCC) 03/21/2017   Diarrhea  03/03/2017   Hyperkalemia 01/24/2017   Retained lens material following cataract surgery of both eyes 11/08/2016   Venous stasis ulcers of both lower extremities (HCC) 08/07/2015   Varicose veins of lower extremities with complications 08/07/2015   Malfunction of arteriovenous dialysis fistula (HCC)    CHF (congestive heart failure) (HCC) 06/23/2015   Headache, unspecified 06/23/2015   Iron deficiency anemia, unspecified 06/23/2015   Other specified coagulation defects (HCC) 06/23/2015   Pain, unspecified 06/23/2015   Pruritus, unspecified 06/23/2015   Secondary hyperparathyroidism of renal origin (HCC) 06/23/2015   Type 2 diabetes mellitus with diabetic peripheral angiopathy without gangrene (HCC) 06/23/2015   Lymphedema distichiasis syndrome with kidney disease and diabetes mellitus (HCC) 04/06/2015   Lymphedema of lower extremity 04/04/2015   Type II diabetes mellitus with nephropathy (HCC) 04/04/2015   MGUS (monoclonal gammopathy of unknown significance) 06/12/2014   Chronic diastolic heart failure (HCC) 10/30/2013   Special screening for malignant neoplasms, colon 07/09/2013   Proliferative diabetic retinopathy (HCC) 10/23/2012   Knee pain 08/21/2012   Mass of thigh 08/21/2012   FEVER UNSPECIFIED 05/06/2010   ORGANIC IMPOTENCE 12/02/2009   ONYCHOMYCOSIS 07/17/2009   Lymphedema 05/06/2009   Stage 5 chronic kidney disease on chronic dialysis (HCC) 06/09/2008   Hyperlipidemia 03/19/2007   Class 2 severe obesity due to excess calories with serious comorbidity and body mass index (BMI) of 35.0 to 35.9 in adult [facility]) 02/08/2007   Essential hypertension 02/08/2007   Atherosclerosis of native arteries of extremity with intermittent claudication (HCC) 02/08/2007    Orientation  RESPIRATION BLADDER Height & Weight     Self, Time, Situation, Place  Normal Continent Weight: 158 lb 8.2 oz (71.9 kg) Height:  5\' 10"  (177.8 cm)  BEHAVIORAL SYMPTOMS/MOOD NEUROLOGICAL BOWEL NUTRITION STATUS       Continent Diet (See dc summary)  AMBULATORY STATUS COMMUNICATION OF NEEDS Skin   Limited Assist Verbally Normal                       Personal Care Assistance Level of Assistance  Bathing, Feeding, Dressing Bathing Assistance: Limited assistance Feeding assistance: Limited assistance Dressing Assistance: Limited assistance     Functional Limitations Info  Sight Sight Info: Impaired        SPECIAL CARE FACTORS FREQUENCY  PT (By licensed PT), OT (By licensed OT)     PT Frequency: Eval and treat OT Frequency: Eval and treat            Contractures Contractures Info: Not present    Additional Factors Info  Code Status, Allergies Code Status Info: Full Allergies Info: Shellfish Allergy, Shrimp (Diagnostic)           Current Medications (04/18/2024):  This is the current hospital active medication list Current Facility-Administered Medications  Medication Dose Route Frequency Provider Last Rate Last Admin   (feeding supplement) PROSource Plus liquid 30 mL  30 mL Oral BID BM Brown, Rita Harbison, NP   30 mL at 04/17/24 1321   acetaminophen  (TYLENOL ) tablet 650 mg  650 mg Oral Q6H PRN Nooruddin, Saad, MD   650 mg at 04/10/24 1249   apixaban  (ELIQUIS ) tablet 5 mg  5 mg Oral BID Nooruddin, Saad, MD   5 mg at 04/17/24 2133   aspirin  chewable tablet 81 mg  81 mg Oral Daily Nooruddin, Saad, MD   81 mg at 04/17/24 1105   atorvastatin  (LIPITOR ) tablet 80 mg  80 mg Oral QHS Nooruddin, Saad, MD   80 mg at 04/16/24 2024   Chlorhexidine  Gluconate Cloth 2 % PADS 6 each  6 each Topical Q0600 Kitty Perkins, NP       cinacalcet  (SENSIPAR ) tablet 30 mg  30 mg Oral Q T,Th,Sat-1800 Peeples, Samuel J, MD   30 mg at 04/16/24 1726   doxercalciferol  (HECTOROL ) injection 6 mcg  6 mcg Intravenous Q T,Th,Sa-HD Levorn Reason, MD   6 mcg at 04/17/24 0812   feeding supplement (NEPRO CARB STEADY) liquid 237 mL  237 mL Oral BID BM Raliegh Burgess, NP   237 mL at 04/16/24 1132    hydrALAZINE  (APRESOLINE ) tablet 25 mg  25 mg Oral TID Nooruddin, Saad, MD   25 mg at 04/17/24 2135   hydrocerin (EUCERIN) cream   Topical BID Brown, Rita Harbison, NP   Given at 04/17/24 2135   levothyroxine  (SYNTHROID ) tablet 50 mcg  50 mcg Oral Q0600 Priscella Brooms, DO   50 mcg at 04/18/24 0530   lisinopril  (ZESTRIL ) tablet 40 mg  40 mg Oral QHS Nooruddin, Saad, MD   40 mg at 04/17/24 2131   multivitamin (RENA-VIT) tablet 1 tablet  1 tablet Oral QHS Brown, Rita Harbison, NP   1 tablet at 04/17/24 2132   Oral care mouth rinse  15 mL Mouth Rinse PRN Sandie Cross, MD       sucroferric oxyhydroxide (VELPHORO ) chewable tablet 1,000 mg  1,000 mg Oral TID WC Nooruddin, Saad, MD   1,000 mg at 04/12/24 1610     Discharge Medications: Please see discharge summary for  a list of discharge medications.  Relevant Imaging Results:  Relevant Lab Results:   Additional Information SSN # [id number]  Dialysis @ [facility] T/TH/SAT  Jannice Mends, [place]

## 2024-04-18 NOTE — TOC Transition Note (Signed)
 Transition of Care [facility]) - Discharge Note   Patient Details  Name: [name] MRN: [id number] Date of Birth: Nov 04, 1951  [facility] CM/SW Contact:  Jannice Mends, LCSW Phone Number: 04/18/2024, 1:37 PM   Clinical Narrative:    Patient will DC to: [facility]  Anticipated DC date: 04/18/24 Family notified: Left vm for Lula Transport by: Lyna Sandhoff   Per MD patient ready for DC to [facility]. RN to call report prior to discharge [phone] room 113a). RN, patient, patient's family, and facility notified of DC. Discharge Summary and FL2 sent to facility. DC packet on chart. Ambulance transport requested for patient.   CSW will sign off for now as social work intervention is no longer needed. Please consult us  again if new needs arise.     Final next level of care: Skilled Nursing Facility Barriers to Discharge: Barriers Resolved   Patient Goals and CMS Choice Patient states their goals for this hospitalization and ongoing recovery are:: Return to SNF          Discharge Placement   Existing PASRR number confirmed : 04/18/24          Patient [facility] bed at:  [facility]) Patient to be transferred to facility by: [facility] Name of family member notified: Lula Patient and family notified of of transfer: 04/18/24  Discharge Plan and Services Additional resources added to the After Visit Summary for   In-house Referral: Clinical Social Work   Post Acute Care Choice: Skilled Nursing Facility                               Social Drivers of Health (SDOH) Interventions SDOH Screenings   Food Insecurity: No Food Insecurity (04/05/2024)  Housing: Low Risk  (04/05/2024)  Transportation Needs: No Transportation Needs (04/05/2024)  Utilities: Not At Risk (04/05/2024)  Social Connections: Moderately Isolated (04/05/2024)  Tobacco Use: Medium Risk (04/05/2024)     Readmission Risk Interventions     No data to display

## 2024-04-18 NOTE — Progress Notes (Signed)
 Physical Therapy Treatment Patient Details Name: [name] MRN: [id number] DOB: 1951/02/10 Today's Date: 04/18/2024   History of Present Illness [name] is a 73 year old male who presented to the ED 4/25 for evaluation after a fall.  Found to have a mild to moderate right pleural effusion. S/p Thoracentesis with IR 4/25 and R CT placed 4/29. PMhx: CKD on HD Tuesday/Thursday/Saturday; CHF, diabetes, CKD, HTN.    PT Comments  Pt feeling much better today since oozing from CT site has stopped. Ambulated in room with RW with supervision, needs RW for stability with mobility. Worked on sitting and standing balance activities, limitation evident with reaching activities. Pt continues to have limited tolerance for ambulation due to R knee OA. Tolerated R knee strengthening exercises with resistance. Encouraged pt to participate in exercise activities at [facility]. Patient will benefit from continued inpatient follow up therapy, <3 hours/day.  HR 65 bpm, SPO2 98% on RA.     If plan is discharge home, recommend the following: A little help with walking and/or transfers;Assistance with cooking/housework   Can travel by private vehicle     Yes  Equipment Recommendations  Rolling walker (2 wheels)    Recommendations for Other Services       Precautions / Restrictions Precautions Precautions: Fall Recall of Precautions/Restrictions: Intact Precaution/Restrictions Comments: fell PTA, BLE swelling, R knee OA Restrictions Weight Bearing Restrictions Per Provider Order: No     Mobility  Bed Mobility Overal bed mobility: Modified Independent Bed Mobility: Supine to Sit, Sit to Supine                Transfers Overall transfer level: Needs assistance Equipment used: Rolling walker (2 wheels) Transfers: Sit to/from Stand Sit to Stand: Supervision           General transfer comment: supervision from bed and toilet, no physical assist needed, takes increased time due to R knee  discomfort    Ambulation/Gait Ambulation/Gait assistance: Contact guard assist Gait Distance (Feet): 20 Feet Assistive device: Rolling walker (2 wheels) Gait Pattern/deviations: Decreased stride length, Trunk flexed, Decreased dorsiflexion - left, Step-to pattern, Decreased weight shift to right Gait velocity: dec Gait velocity interpretation: <1.8 ft/sec, indicate of risk for recurrent falls   General Gait Details: ambulated within room, generally keeps ambulation distance to 20-25' due to LE pain. Pt effectively using RW for balance, no LOB with turns or navigating through tight spaces   Stairs             Wheelchair Mobility     Tilt Bed    Modified Rankin (Stroke Patients Only)       Balance Overall balance assessment: Needs assistance Sitting-balance support: Feet supported Sitting balance-Leahy Scale: Good Sitting balance - Comments: worked on dynamic sitting activities, pt completed without LOB   Standing balance support: Single extremity supported Standing balance-Leahy Scale: Poor Standing balance comment: worked on standing balance with unilateral support, limitations evident with reaching activities                            Communication Communication Communication: No apparent difficulties  Cognition Arousal: Alert Behavior During Therapy: WFL for tasks assessed/performed   PT - Cognitive impairments: No apparent impairments                         Following commands: Intact      Cueing Cueing Techniques: Verbal cues  Exercises General Exercises -  Lower Extremity Long Arc Quad: AROM, Both, 10 reps, Seated (with manual resistance)    General Comments General comments (skin integrity, edema, etc.): HR 65 bpm, SPO2 98% on RA      Pertinent Vitals/Pain Pain Assessment Pain Assessment: Faces Faces Pain Scale: Hurts a little bit Pain Location: R knee, chronic Pain Descriptors / Indicators: Discomfort Pain  Intervention(s): Monitored during session    Home Living                          Prior Function            PT Goals (current goals can now be found in the care plan section) Acute Rehab PT Goals Patient Stated Goal: Move out of [place] PT Goal Formulation: With patient Time For Goal Achievement: 04/19/24 Potential to Achieve Goals: Good Progress towards PT goals: Progressing toward goals    Frequency    Min 1X/week      PT Plan      Co-evaluation              AM-PAC PT "6 Clicks" Mobility   Outcome Measure  Help needed turning from your back to your side while in a flat bed without using bedrails?: None Help needed moving from lying on your back to sitting on the side of a flat bed without using bedrails?: A Little Help needed moving to and from a bed to a chair (including a wheelchair)?: A Little Help needed standing up from a chair using your arms (e.g., wheelchair or bedside chair)?: A Little Help needed to walk in hospital room?: A Little Help needed climbing 3-5 steps with a railing? : Total 6 Click Score: 17    End of Session Equipment Utilized During Treatment: Gait belt Activity Tolerance: Patient tolerated treatment well Patient left: with call bell/phone within reach;in chair;with chair alarm set Nurse Communication: Mobility status PT Visit Diagnosis: Unsteadiness on feet (R26.81);Other abnormalities of gait and mobility (R26.89);Repeated falls (R29.6);Muscle weakness (generalized) (M62.81);History of falling (Z91.81)     Time: 1610-9604 PT Time Calculation (min) (ACUTE ONLY): 17 min  Charges:    $Gait Training: 8-22 mins PT General Charges $$ ACUTE PT VISIT: 1 Visit                     Amey Ka, PT  Acute Rehab Services Secure chat preferred Office [phone]    Deloris Fetters Marka Treloar 04/18/2024, 11:21 AM

## 2024-04-18 NOTE — Discharge Summary (Addendum)
 Name: [name] MRN: [id number] DOB: 02-19-1951 73 y.o. PCP: Shannan Dart., [name]  Date of Admission: 04/05/2024  8:17 AM Date of Discharge:  04/18/2024 Attending Physician: Dr. Lelia Putnam MD  DISCHARGE DIAGNOSIS:  Primary Problem: Pleural effusion   Hospital Problems: Principal Problem:   Pleural effusion Active Problems:   Stage 5 chronic kidney disease on chronic dialysis [facility])   Fall    DISCHARGE MEDICATIONS:   Allergies as of 04/18/2024       Reactions   Shellfish Allergy Swelling   Shrimp (diagnostic) Swelling        Medication List     PAUSE taking these medications    metoprolol  succinate 25 MG 24 hr tablet Wait to take this until your doctor or other care provider tells you to start again. Commonly known as: TOPROL -XL Take 1 tablet (25 mg total) by mouth daily.       STOP taking these medications    guaiFENesin  100 MG/5ML liquid Commonly known as: ROBITUSSIN       TAKE these medications    acetaminophen  325 MG tablet Commonly known as: TYLENOL  Take 650 mg by mouth every 6 (six) hours as needed for mild pain (pain score 1-3) or fever.   apixaban  5 MG Tabs tablet Commonly known as: ELIQUIS  Take 1 tablet (5 mg total) by mouth 2 (two) times daily.   aspirin  81 MG chewable tablet Chew 81 mg by mouth daily.   atorvastatin  80 MG tablet Commonly known as: LIPITOR  Take 80 mg by mouth at bedtime.   cinacalcet  30 MG tablet Commonly known as: SENSIPAR  Take 1 tablet (30 mg total) by mouth every Tuesday, Thursday, and Saturday at 6 PM.   docusate sodium  100 MG capsule Commonly known as: COLACE Take 100 mg by mouth daily.   glucagon 1 MG Solr injection Commonly known as: GLUCAGEN Inject 1 mg into the vein daily as needed for low blood sugar.   hydrALAZINE  25 MG tablet Commonly known as: APRESOLINE  Take 1 tablet (25 mg total) by mouth 3 (three) times daily. What changed: when to take this   levothyroxine  50 MCG tablet Commonly known  as: SYNTHROID  Take 50 mcg by mouth daily before breakfast.   lisinopril  40 MG tablet Commonly known as: ZESTRIL  Take 40 mg by mouth at bedtime. What changed: Another medication with the same name was removed. Continue taking this medication, and follow the directions you see here.   multivitamin with minerals tablet Take 1 tablet by mouth daily in the afternoon.   Velphoro  500 MG chewable tablet Generic drug: sucroferric oxyhydroxide Chew 1,000 mg by mouth 3 (three) times daily with meals.        DISPOSITION AND FOLLOW-UP:  [name] was discharged from [facility] in [place] condition. At the hospital follow up visit please address:  Follow-up Recommendations: Consults: Pulmonology Labs: Basic Metabolic Profile and CBC Studies: Repeat chest imaging as clinically appropriate Medications: None  Follow-up Appointments:  Contact information for after-discharge care     Destination     [facility] .   Service: Skilled Nursing Contact information: [address] [place]  [phone]                     HOSPITAL COURSE:  Patient Summary: [name] is a 73 y.o. with a pertinent PMH of A-fib on A/C, type 2 diabetes, hypertension, ESRD on hemodialysis who presented from SNF after a fall, found  to have right pleural effusion.  He underwent thoracentesis x2 and placement of right chest tube.  His hospital stay was complicated by persistent chest tube site output as well as hyperkalemia.  His right-sided chest tube site output eventually slowed, and he resumed his prior anticoagulation regimen without complication.  His hyperkalemia was temporized and resolved with dialysis.  Of note the patient signed himself out of dialysis early AMA, and refused dialysis on day of discharge.    # Fall He presented from skilled nursing [facility] by EMS after he had a fall when transferring from his bed to  a wheelchair.  Per EMS report he complained of dizziness prior to the fall.  It is suspected that the patient's fall was secondary to orthostatic hypotension as EMS stated patient was hypotensive at the time. CT of the head negative for acute bleed. No focal weakness on exams.   # Right pleural effusion # Chronic diastolic heart failure Patient with a prior hx of right sided mild pleural effusion as noted in CXR 2024. He was found to have a moderate pleural effusion, during this hospitalization. He was asymptomatic.  A 5-day course of Unasyn  was completed for a presumed pulmonary infection/empyema. He underwent a thoracentesis, which yielded approximately 650 cc of blood-tinged fluid. Cytology of the pleural fluid showed chronic inflammation without malignant cells; AFB smear was negative. Fluid studies were consistent with a lymphocytic exudative pleural effusion. PCCM was consulted and performed a repeat thoracentesis, removing approximately 500 cc of serosanguineous fluid. Subsequently, a chest tube was placed. Cardiothoracic surgery was consulted but did not recommend any surgical intervention, as the patient was deemed a poor surgical candidate.  Pulmonology did not feel that the patient would be a good candidate for pleural lytics due to lung trapping.  Interventional radiology was consulted regarding possible consolidation and lung biopsy but they did not feel like they had a appropriate target for biopsy. After chest tube removal, the patient also experienced bleeding at the chest tube insertion site.  Anticoagulation was discontinued, pressure dressings were applied, and output stopped.  Anticoagulation was then again resumed without complication.  Repeat imaging redemonstrated persistence of complex effusion.  The patient was asymptomatic during this time. Pulmonology was again consulted who felt that another chest tube was not warranted.  If symptomatic they would consider chest tube, pleural  lytics, and eventual pleurX.  They also felt that should this process be malignant it would be terminal and he would not be a candidate for any antineoplastic treatment.  They recommended close monitoring and will see the patient in their office in the coming weeks -Close follow-up with pulmonology appreciate their help.  # CKD V on ESRD (Tue/Th/ Sat) # Normocytic anemia Hospital stay complicated by hyperkalemia due to missed dialysis in the setting of access complications.  The patient signed AMA and left dialysis early, but their hyperkalemia did resolve.  They refused repeat dialysis on day of discharge, nephrology coordinated discharge for the patient felt that he was safe to go home from a renal perspective. - Continue dialysis per nephrology  # A-fib on Eliquis  Asymptomatic  Continued Eliquis  5 mg.  #Hypothyroidism - Continue levothyroxine  50 mcg   #Hypertension - Lisinopril  40 mg. - Hydralazine  25 mg 3 times daily   #Hyperlipidemia #PVD - Continue Lipitor  80 mg daily - ASA 81   DISCHARGE INSTRUCTIONS:    SUBJECTIVE:  Seen on rounds today, denies any new symptoms.  No new chest tube site leakage, no worsening shortness  of breath.  He is stable.  Discussed importance of completing dialysis sessions, patient would prefer to avoid dialysis today.  After discussion he states he will try to attend dialysis.  Discharge Vitals:   BP 116/62   Pulse (!) 59   Temp 97.9 F (36.6 C) (Oral)   Resp 16   Ht 5\' 10"  (1.778 m)   Wt 71.9 kg   SpO2 96%   BMI 22.74 kg/m   OBJECTIVE:  Physical Exam Constitutional:      General: He is not in acute distress. Cardiovascular:     Rate and Rhythm: Normal rate and regular rhythm.     Heart sounds: Murmur heard.  Pulmonary:     Effort: Pulmonary effort is normal.     Comments: Decreased breath sounds right lung base.  Dressing overlying chest tube site without significant drainage or discharge. Abdominal:     General: Abdomen is flat.  There is no distension.     Palpations: Abdomen is soft.     Tenderness: There is no abdominal tenderness.  Skin:    General: Skin is warm and dry.      Pertinent Labs, Studies, and Procedures:     Latest Ref Rng & Units 04/18/2024    5:49 AM 04/17/2024    4:54 AM 04/16/2024   11:30 AM  CBC  WBC 4.0 - 10.5 K/uL 5.1  6.2  7.1   Hemoglobin 13.0 - 17.0 g/dL 8.3  8.2  9.1   Hematocrit 39.0 - 52.0 % 26.0  25.5  28.5   Platelets 150 - 400 K/uL 117  122  120        Latest Ref Rng & Units 04/18/2024    5:49 AM 04/17/2024    8:24 PM 04/17/2024    4:54 AM  CMP  Glucose 70 - 99 mg/dL 79  454  83   BUN 8 - 23 mg/dL 61  59  89   Creatinine 0.61 - 1.24 mg/dL 0.98  1.19  14.78   Sodium 135 - 145 mmol/L 136  136  137   Potassium 3.5 - 5.1 mmol/L 5.0  4.6  6.1   Chloride 98 - 111 mmol/L 97  98  101   CO2 22 - 32 mmol/L 27  28  23    Calcium  8.9 - 10.3 mg/dL 8.7  8.4  8.7     ECHOCARDIOGRAM COMPLETE Result Date: 04/06/2024    ECHOCARDIOGRAM REPORT   Patient Name:   [name] Date of Exam: 04/06/2024 Medical Rec #:  [id number]       Height:       70.0 in Accession #:    [id number]      Weight:       171.5 lb Date of Birth:  13-Jun-1951       BSA:          1.955 m Patient Age:    73 years        BP:           139/62 mmHg Patient Gender: M               HR:           63 bpm. Exam Location:  Inpatient Procedure: 2D Echo, Cardiac Doppler and Color Doppler (Both Spectral and Color            Flow Doppler were utilized during procedure). Indications:    I50.31 Acute diastolic (congestive) heart failure  History:  Patient has prior history of Echocardiogram examinations, most                 recent 06/16/2021. CHF, Chronic Kidney Disease; Risk                 Factors:Diabetes and Hypertension.  Sonographer:    Andrena Bang Referring Phys: SAAD NOORUDDIN IMPRESSIONS  1. Left ventricular ejection fraction, by estimation, is 60 to 65%. The left ventricle has normal function. The left ventricle has no regional wall  motion abnormalities. Left ventricular diastolic parameters were normal.  2. Right ventricular systolic function is normal. The right ventricular size is normal. There is severely elevated pulmonary artery systolic pressure. The estimated right ventricular systolic pressure is 88.7 mmHg.  3. The mitral valve is normal in structure. Mild mitral valve regurgitation. No evidence of mitral stenosis.  4. Tricuspid valve regurgitation is moderate.  5. The aortic valve has an indeterminant number of cusps. There is moderate calcification of the aortic valve. There is moderate thickening of the aortic valve. Aortic valve regurgitation is not visualized. Aortic valve sclerosis is present, with no evidence of aortic valve stenosis. Aortic valve mean gradient measures 8.0 mmHg. Aortic valve Vmax measures 1.98 m/s.  6. The inferior vena cava is normal in size with greater than 50% respiratory variability, suggesting right atrial pressure of 3 mmHg. FINDINGS  Left Ventricle: Left ventricular ejection fraction, by estimation, is 60 to 65%. The left ventricle has normal function. The left ventricle has no regional wall motion abnormalities. The left ventricular internal cavity size was normal in size. There is  no left ventricular hypertrophy. Left ventricular diastolic parameters were normal. Right Ventricle: The right ventricular size is normal. No increase in right ventricular wall thickness. Right ventricular systolic function is normal. There is severely elevated pulmonary artery systolic pressure. The tricuspid regurgitant velocity is 4.63 m/s, and with an assumed right atrial pressure of 3 mmHg, the estimated right ventricular systolic pressure is 88.7 mmHg. Left Atrium: Left atrial size was normal in size. Right Atrium: Right atrial size was normal in size. Pericardium: There is no evidence of pericardial effusion. Mitral Valve: The mitral valve is normal in structure. Mild mitral valve regurgitation. No evidence of mitral  valve stenosis. Tricuspid Valve: The tricuspid valve is normal in structure. Tricuspid valve regurgitation is moderate . No evidence of tricuspid stenosis. Aortic Valve: The aortic valve has an indeterminant number of cusps. There is moderate calcification of the aortic valve. There is moderate thickening of the aortic valve. Aortic valve regurgitation is not visualized. Aortic valve sclerosis is present, with no evidence of aortic valve stenosis. Aortic valve mean gradient measures 8.0 mmHg. Aortic valve peak gradient measures 15.7 mmHg. Aortic valve area, by VTI measures 1.55 cm. Pulmonic Valve: The pulmonic valve was normal in structure. Pulmonic valve regurgitation is not visualized. No evidence of pulmonic stenosis. Aorta: The aortic root is normal in size and structure. Venous: The inferior vena cava is normal in size with greater than 50% respiratory variability, suggesting right atrial pressure of 3 mmHg. IAS/Shunts: No atrial level shunt detected by color flow Doppler.  LEFT VENTRICLE PLAX 2D LVIDd:         4.50 cm      Diastology LVIDs:         2.80 cm      LV e' medial:    4.35 cm/s LV PW:         1.50 cm      LV E/e'  medial:  29.9 LV IVS:        0.90 cm      LV e' lateral:   10.10 cm/s LVOT diam:     1.90 cm      LV E/e' lateral: 12.9 LV SV:         67 LV SV Index:   34 LVOT Area:     2.84 cm  LV Volumes (MOD) LV vol d, MOD A2C: 121.0 ml LV vol d, MOD A4C: 115.0 ml LV vol s, MOD A2C: 46.6 ml LV vol s, MOD A4C: 49.2 ml LV SV MOD A2C:     74.4 ml LV SV MOD A4C:     115.0 ml LV SV MOD BP:      72.8 ml RIGHT VENTRICLE RV S prime:     9.90 cm/s TAPSE (M-mode): 2.1 cm LEFT ATRIUM             Index LA diam:        3.50 cm 1.79 cm/m LA Vol (A2C):   83.9 ml 42.91 ml/m LA Vol (A4C):   37.6 ml 19.23 ml/m LA Biplane Vol: 55.6 ml 28.43 ml/m  AORTIC VALVE AV Area (Vmax):    1.50 cm AV Area (Vmean):   1.41 cm AV Area (VTI):     1.55 cm AV Vmax:           198.00 cm/s AV Vmean:          134.000 cm/s AV VTI:             0.434 m AV Peak Grad:      15.7 mmHg AV Mean Grad:      8.0 mmHg LVOT Vmax:         105.00 cm/s LVOT Vmean:        66.800 cm/s LVOT VTI:          0.237 m LVOT/AV VTI ratio: 0.55  AORTA Ao Asc diam: 3.20 cm MITRAL VALVE                TRICUSPID VALVE MV Area (PHT): 4.44 cm     TR Peak grad:   85.7 mmHg MV Decel Time: 171 msec     TR Vmax:        463.00 cm/s MV E velocity: 130.00 cm/s MV A velocity: 92.00 cm/s   SHUNTS MV E/A ratio:  1.41         Systemic VTI:  0.24 m                             Systemic Diam: 1.90 cm Dorothye Gathers MD Electronically signed by Dorothye Gathers MD Signature Date/Time: 04/06/2024/1:17:04 PM    Final    CT CHEST W CONTRAST Result Date: 04/06/2024 CLINICAL DATA:  Pleural effusion.  Malignancy suspected. EXAM: CT CHEST WITH CONTRAST TECHNIQUE: Multidetector CT imaging of the chest was performed during intravenous contrast administration. RADIATION DOSE REDUCTION: This exam was performed according to the departmental dose-optimization program which includes automated exposure control, adjustment of the mA and/or kV according to patient size and/or use of iterative reconstruction technique. CONTRAST:  75mL OMNIPAQUE  IOHEXOL  350 MG/ML SOLN COMPARISON:  Plain film of 1 day prior.  CTA chest 07/07/2021 FINDINGS: Degradation secondary to minimal motion and patient arm position, not raised above the head. There is also EKG wire and lead artifact. Cardiovascular: Aortic atherosclerosis. Mild cardiomegaly, without pericardial effusion. Three vessel coronary artery calcification. No central pulmonary  embolism, on this non-dedicated study. Mediastinum/Nodes: Periesophageal node at the level of the diaphragmatic hiatus measures 11 mm on 121/3 and is new. No hilar adenopathy. Lungs/Pleura: Small to moderate right-sided pleural effusion. Trace pleural air including on 105/3 is likely iatrogenic in the setting of recent thoracentesis. Dependent right lower lobe collapse/consolidation including on 105/4.  Subpleural right middle masslike opacity including at 4.8 x 3.5 cm on 88/4. Trace fluid in the left major fissure. Calcified left diaphragmatic pleural plaque including on coronal image 66. Upper Abdomen: Normal imaged portions of the liver, spleen, stomach, left adrenal gland. Musculoskeletal: Advanced thoracic spondylosis. IMPRESSION: 1. Mild to moderate multifactorial degradation as detailed above. 2. Small to moderate right pleural effusion. Trace right pleural air is likely iatrogenic. 3. Right lower lobe collapse/consolidation is most likely related to atelectasis versus less likely pneumonia. A right middle lobe subpleural masslike soft tissue density is indeterminate. This may simply represent atelectasis. A neoplasm cannot be entirely excluded. Potential clinical strategies include repeat thoracentesis with repeat CT after pleural fluid drainage versus a more aggressive approach with PET and/or bronchoscopy. 4. trace left pleural fluid with left pleural calcifications suggesting remote empyema or hemothorax. 5. Isolated nonspecific lower mediastinal/periesophageal adenopathy. 6. Incidental findings, including: Coronary artery atherosclerosis. Aortic Atherosclerosis (ICD10-I70.0). Electronically Signed   By: Lore Rode M.D.   On: 04/06/2024 09:27   IR THORACENTESIS ASP PLEURAL SPACE W/IMG GUIDE Result Date: 04/05/2024 INDICATION: 73 year old male presented to ED after a fall, with new pleural effusion noted. IR was requested for diagnostic and therapeutic thoracentesis. EXAM: ULTRASOUND GUIDED DIAGNOSTIC AND THERAPEUTIC THORACENTESIS MEDICATIONS: 6 cc of 1% lidocaine  COMPLICATIONS: None immediate. PROCEDURE: An ultrasound guided thoracentesis was thoroughly discussed with the patient and questions answered. The benefits, risks, alternatives and complications were also discussed. The patient understands and wishes to proceed with the procedure. Written consent was obtained. Ultrasound was performed to  localize and mark an adequate pocket of fluid in the right chest. The area was then prepped and draped in the normal sterile fashion. 1% Lidocaine  was used for local anesthesia. Under ultrasound guidance a 6 Fr Safe-T-Centesis catheter was introduced. Thoracentesis was performed. The catheter was removed and a dressing applied. FINDINGS: A total of approximately 625 cc of opaque, blood laden pleural fluid was removed. Samples were sent to the laboratory as requested by the clinical team. IMPRESSION: Successful ultrasound guided right thoracentesis yielding 625 cc of pleural fluid. Procedure performed by Lambert Pillion, PA-C Electronically Signed   By: Melven Stable.  Shick M.D.   On: 04/05/2024 16:03   DG Chest 1 View Result Date: 04/05/2024 CLINICAL DATA:  Status post thoracentesis.  Fall. EXAM: CHEST  1 VIEW COMPARISON:  Chest radiograph dated 04/05/2024. FINDINGS: Similar or slightly decreased right pleural effusion with right lung base atelectasis or infiltrate. No pneumothorax. Stable cardiac silhouette. No acute osseous pathology. IMPRESSION: Similar or slightly decreased right pleural effusion. No pneumothorax. Electronically Signed   By: Angus Bark M.D.   On: 04/05/2024 15:14   CT Head Wo Contrast Result Date: 04/05/2024 CLINICAL DATA:  Minor head trauma, fall on blood thinner EXAM: CT HEAD WITHOUT CONTRAST CT CERVICAL SPINE WITHOUT CONTRAST TECHNIQUE: Multidetector CT imaging of the head and cervical spine was performed following the standard protocol without intravenous contrast. Multiplanar CT image reconstructions of the cervical spine were also generated. RADIATION DOSE REDUCTION: This exam was performed according to the departmental dose-optimization program which includes automated exposure control, adjustment of the mA and/or kV according to patient size and/or use of  iterative reconstruction technique. COMPARISON:  09/20/2023 FINDINGS: CT HEAD FINDINGS Brain: No evidence of acute infarction,  hemorrhage, hydrocephalus, extra-axial collection or mass lesion/mass effect. Low-density in the cerebral white matter attributed to chronic small vessel ischemia. Chronic lacunar infarct at the genu of the left internal capsule. Vascular: No hyperdense vessel or unexpected calcification. Skull: Normal. Negative for fracture or focal lesion. Sinuses/Orbits: No evidence of injury CT CERVICAL SPINE FINDINGS Alignment: Straightening of cervical lordosis. Skull base and vertebrae: No acute fracture. No primary bone lesion or focal pathologic process. Soft tissues and spinal canal: No prevertebral fluid or swelling. No visible canal hematoma. Disc levels: Generalized degenerative disc narrowing and endplate degeneration. Generalized degenerative facet spurring. Multilevel foraminal impingement. Upper chest: Costovertebral ankylosis at upper thoracic levels. No acute finding IMPRESSION: No evidence of acute intracranial or cervical spine injury. Electronically Signed   By: Ronnette Coke M.D.   On: 04/05/2024 10:02   CT Cervical Spine Wo Contrast Result Date: 04/05/2024 CLINICAL DATA:  Minor head trauma, fall on blood thinner EXAM: CT HEAD WITHOUT CONTRAST CT CERVICAL SPINE WITHOUT CONTRAST TECHNIQUE: Multidetector CT imaging of the head and cervical spine was performed following the standard protocol without intravenous contrast. Multiplanar CT image reconstructions of the cervical spine were also generated. RADIATION DOSE REDUCTION: This exam was performed according to the departmental dose-optimization program which includes automated exposure control, adjustment of the mA and/or kV according to patient size and/or use of iterative reconstruction technique. COMPARISON:  09/20/2023 FINDINGS: CT HEAD FINDINGS Brain: No evidence of acute infarction, hemorrhage, hydrocephalus, extra-axial collection or mass lesion/mass effect. Low-density in the cerebral white matter attributed to chronic small vessel ischemia. Chronic  lacunar infarct at the genu of the left internal capsule. Vascular: No hyperdense vessel or unexpected calcification. Skull: Normal. Negative for fracture or focal lesion. Sinuses/Orbits: No evidence of injury CT CERVICAL SPINE FINDINGS Alignment: Straightening of cervical lordosis. Skull base and vertebrae: No acute fracture. No primary bone lesion or focal pathologic process. Soft tissues and spinal canal: No prevertebral fluid or swelling. No visible canal hematoma. Disc levels: Generalized degenerative disc narrowing and endplate degeneration. Generalized degenerative facet spurring. Multilevel foraminal impingement. Upper chest: Costovertebral ankylosis at upper thoracic levels. No acute finding IMPRESSION: No evidence of acute intracranial or cervical spine injury. Electronically Signed   By: Ronnette Coke M.D.   On: 04/05/2024 10:02   DG Pelvis 1-2 Views Result Date: 04/05/2024 CLINICAL DATA:  Fall. EXAM: PELVIS - 1-2 VIEW COMPARISON:  June 14, 2021. FINDINGS: There is no evidence of pelvic fracture or diastasis. No pelvic bone lesions are seen. IMPRESSION: Negative. Electronically Signed   By: Rosalene Colon M.D.   On: 04/05/2024 09:06   DG Chest Portable 1 View Result Date: 04/05/2024 CLINICAL DATA:  Fall. EXAM: PORTABLE CHEST 1 VIEW COMPARISON:  January 19, 2023. FINDINGS: Stable cardiomediastinal silhouette. Left lung is clear. Mild to moderate right pleural effusion is noted with associated atelectasis. Bony thorax is unremarkable. IMPRESSION: Mild to moderate right pleural effusion is noted with associated right basilar atelectasis. Electronically Signed   By: Rosalene Colon M.D.   On: 04/05/2024 09:04     Signed: [name] MD Internal Medicine Resident, PGY-1 [facility]  Pager: [phone] 1:02 PM, 04/18/2024

## 2024-04-18 NOTE — Discharge Planning (Signed)
 [facility] Patient Discharge Orders- [facility] CLINIC: [facility]-Dc'd to SNF  Patient's name: [name] Admit/DC Dates: 04/05/2024 - 04/18/2024  Discharge Diagnoses: R-pleural effusion: S/p thoracentesis X 2 and chest tube placement.  ABXs completed. Chest tube removed. There was concern for possible malignancy but cytology was negative for malignant cells and AFB was negative. Per dc summary, has f/u with pulmonary Bleeding from chest tube site: [facility] stopped but now resumed  Aranesp : Given: No    Last Hgb: 8.3 PRBC's Given: No  ESA dose for discharge: No ESA for now IV Iron dose at discharge: Continue weekly Fe  Heparin  change: No  EDW Change: Yes  New EDW: Lower to 73kg  *It appears he likely is losing weight. Notify renal for weight discrepancies   Bath Change: No  Access intervention/Change: No  Hectorol  change: No  Discharge Labs: Calcium  8.7  Phosphorus 6.7  Albumin 2.5  K+ 5.0  IV Antibiotics: No  On Coumadin?: No, on Eliquis      D/C Meds to be reconciled by nurse after every discharge.  Completed By: Jadene Maxwell, NP   Reviewed by: MD:______ RN_______

## 2024-04-18 NOTE — Progress Notes (Signed)
 RN attempt to call report to [phone]. First initial call was answered by secretary who said he was transferring me to receiving nurse, which went to voicemail. 2nd and 3rd attempt failed, RN followed prompts, choosing to speak with receptionist, call went to voicemail both times. SW made aware.  AVS placed in discharge packet. Nurse removed PIV from left forearm. Pt alert and oriented on room air at time of departure from unit. Pt taken to discharge lounge via wheelchair.

## 2024-04-18 NOTE — Progress Notes (Signed)
 [facility] Progress Note   Subjective:    Patient signed off early from dialysis again yesterday. Noted net UF 1.4L. Unfortunately, he's been signing off frequently here. Current K+ is 5. He remains euvolemic and on RA. Discussed with Dr. Higinio Love. Okay for discharge back to SNF from a renal standpoint. Reached out to Primary, SW, and our Renal Navigator.  Objective Vitals:   04/17/24 1952 04/18/24 0530 04/18/24 0848 04/18/24 0854  BP: (!) 147/71 (!) 149/60 (!) 136/59 116/62  Pulse: 72 68 62 (!) 59  Resp: 18 16  16   Temp: 98 F (36.7 C) 97.6 F (36.4 C) (!) 97.5 F (36.4 C) 97.9 F (36.6 C)  TempSrc: Oral Oral Oral Oral  SpO2: 99% 95% 99% 96%  Weight:      Height:       Physical Exam General: Alert male in NAD; RA Heart: RRR, no murmurs, rubs or gallops Lungs: normal WOB on RA Abdomen: Soft, non-distended, +BS Extremities: trace firm edema/venous stasis dermatitis Dialysis Access:  LUE AVF + t/b  Filed Weights   04/16/24 2027 04/17/24 0801 04/17/24 1043  Weight: 73.8 kg 73.3 kg 71.9 kg   No intake or output data in the 24 hours ending 04/18/24 1112  Additional Objective Labs: Basic Metabolic Panel: Recent Labs  Lab 04/16/24 0450 04/16/24 1837 04/17/24 0454 04/17/24 2024 04/18/24 0549  NA 137  --  137 136 136  K 5.7*   < > 6.1* 4.6 5.0  CL 103  --  101 98 97*  CO2 19*  --  23 28 27   GLUCOSE 78  --  83 100* 79  BUN 75*  --  89* 59* 61*  CREATININE 11.59*  --  13.26* 9.40* 9.93*  CALCIUM  8.9  --  8.7* 8.4* 8.7*  PHOS 7.2*  --  7.8*  --  6.7*   < > = values in this interval not displayed.   Liver Function Tests: Recent Labs  Lab 04/16/24 0450 04/17/24 0454 04/18/24 0549  ALBUMIN 2.5* 2.6* 2.5*   No results for input(s): "LIPASE", "AMYLASE" in the last 168 hours. CBC: Recent Labs  Lab 04/15/24 0505 04/16/24 0450 04/16/24 1130 04/17/24 0454 04/18/24 0549  WBC 5.6 5.4 7.1 6.2 5.1  NEUTROABS  --   --  4.7  --   --   HGB 9.4* 8.6*  9.1* 8.2* 8.3*  HCT 29.8* 27.4* 28.5* 25.5* 26.0*  MCV 91.4 93.8 91.9 92.1 92.9  PLT 113* 105* 120* 122* 117*   Blood Culture    Component Value Date/Time   SDES FLUID 04/06/2024 1346   SPECREQUEST NONE 04/06/2024 1346   CULT  04/06/2024 1346    NO GROWTH 3 DAYS Performed at [facility], [address], [place], [place] [address]    REPTSTATUS 04/10/2024 FINAL 04/06/2024 1346    Cardiac Enzymes: No results for input(s): "CKTOTAL", "CKMB", "CKMBINDEX", "TROPONINI" in the last 168 hours. CBG: Recent Labs  Lab 04/11/24 1146  GLUCAP 110*   Iron Studies: No results for input(s): "IRON", "TIBC", "TRANSFERRIN", "FERRITIN" in the last 72 hours. Lab Results  Component Value Date   INR 1.00 07/10/2018   INR 1.15 04/12/2017   INR 1.51 03/22/2017   Studies/Results: No results found.  Medications:   (feeding supplement) PROSource Plus  30 mL Oral BID BM   apixaban   5 mg Oral BID   aspirin   81 mg Oral Daily   atorvastatin   80 mg Oral QHS   Chlorhexidine  Gluconate Cloth  6 each Topical Q0600   cinacalcet   30 mg Oral Q T,Th,Sat-1800   doxercalciferol   6 mcg Intravenous Q T,Th,Sa-HD   feeding supplement (NEPRO CARB STEADY)  237 mL Oral BID BM   hydrALAZINE   25 mg Oral TID   hydrocerin   Topical BID   levothyroxine   50 mcg Oral Q0600   lisinopril   40 mg Oral QHS   multivitamin  1 tablet Oral QHS   sucroferric oxyhydroxide  1,000 mg Oral TID WC    Dialysis Orders: East, T,Th, S 4 hrs 180NRe 450/Autoflow 1.5 74.6 kg 2.0 K/ 2.0 Ca AVF - Heparin  3000 units IV initial bolus and 3000 units IV mid run - Hectorol  6 mcg IV three times per week - Sensipar  30 mg PO three times per week - Venofer  50 mg IV weekly  Assessment/Plan: R pleural effusion- pulmonology consulting, CT-guided chest tube placement  4/29,  noted pleural fluid cytology negative. Completed antibiotics. Chest tube now removed but large effusion remains. Repeat imaging 5/6 grossly abnormal - ongoing eval per  primary/pulm. Unclear etiology. Appears outpt f/u being arranged.  ESRD -HD TTS on dialysis schedule. Unfortuantely, he's been refusing and/or signing off 2-2.5hrs early while here. Ran yesterday for 2hrs (1.4L removed). Next HD 5/10 in outpatient. Hypertension/volume  - hypotensive when seen by EMS but BP stable now. Continue home meds. He seems to be losing wt, Will attempt to lower and optimize volume with HD. UF as tolerated. Euvolemic on exam. On RA. Hyperkalemia: K+ now 5 after yesterday's HD. Anemia  - HGB at trending down to 8s today,  cytology negative but per primary notes appears high index of suspicion for underlying malignancy so will hold ESA for now until that's further clarified. Metabolic bone disease -VDRA on hold due to high calcium , continue Sensipar  and binders.  Phosphorus controlled Nutrition - Low albumin, Can have regular diet with fluid restrictions, protein supplement  Dispo - Patient has been refusing and/or signing off dialysis early. Today, K+ is 5 and remains euvolemic on exam. On RA. Okay from a renal standpoint to return back to his SNF today. He can resume his HD on 5/10 in outpatient.  Jadene Maxwell, NP [facility] 04/18/2024,11:12 AM  LOS: 12 days

## 2024-04-18 NOTE — Progress Notes (Signed)
 Contacted by team regarding plans for pt to d/c back to snf today. Contacted [facility] clinic manager to be advised of pt's d/c today and that pt should resume care on Saturday. Clinic manager confirms pt can resume on Saturday. Update provided to team.   Lauraine Polite Renal Navigator [phone]

## 2024-04-18 NOTE — TOC Progression Note (Signed)
 Transition of Care [facility]) - Progression Note    Patient Details  Name: [name] MRN: [id number] Date of Birth: [birth year]  Transition of Care [facility]) CM/SW Contact  Jannice Mends, LCSW Phone Number: 04/18/2024, 9:29 AM  Clinical Narrative:    CSW updated [facility] on likely discharge after Dialysis today. They requested therapy see patient prior to leaving so insurance authorization can be attempted to resume therapies at SNF. This will not prevent discharge.     Expected Discharge Plan: Skilled Nursing Facility Barriers to Discharge: Continued Medical Work up  Expected Discharge Plan and Services In-house Referral: Clinical Social Work   Post Acute Care Choice: Skilled Nursing Facility Living arrangements for the past 2 months: Skilled Nursing Facility                                       Social Determinants of Health (SDOH) Interventions SDOH Screenings   Food Insecurity: No Food Insecurity (04/05/2024)  Housing: Low Risk  (04/05/2024)  Transportation Needs: No Transportation Needs (04/05/2024)  Utilities: Not At Risk (04/05/2024)  Social Connections: Moderately Isolated (04/05/2024)  Tobacco Use: Medium Risk (04/05/2024)    Readmission Risk Interventions     No data to display

## 2024-05-01 ENCOUNTER — Inpatient Hospital Stay (HOSPITAL_COMMUNITY)
Admission: EM | Admit: 2024-05-01 | Discharge: 2024-05-07 | DRG: 871 | Disposition: A | Discharge status: Skilled Nursing Facility | Attending: Internal Medicine | Admitting: Internal Medicine

## 2024-05-01 ENCOUNTER — Encounter (HOSPITAL_COMMUNITY): Payer: Self-pay | Admitting: Emergency Medicine

## 2024-05-01 ENCOUNTER — Emergency Department (HOSPITAL_COMMUNITY)

## 2024-05-01 ENCOUNTER — Inpatient Hospital Stay (HOSPITAL_COMMUNITY)

## 2024-05-01 DIAGNOSIS — E1151 Type 2 diabetes mellitus with diabetic peripheral angiopathy without gangrene: Secondary | ICD-10-CM | POA: Diagnosis present

## 2024-05-01 DIAGNOSIS — A419 Sepsis, unspecified organism: Secondary | ICD-10-CM | POA: Diagnosis present

## 2024-05-01 DIAGNOSIS — R569 Unspecified convulsions: Secondary | ICD-10-CM | POA: Diagnosis not present

## 2024-05-01 DIAGNOSIS — N186 End stage renal disease: Secondary | ICD-10-CM | POA: Diagnosis present

## 2024-05-01 DIAGNOSIS — I132 Hypertensive heart and chronic kidney disease with heart failure and with stage 5 chronic kidney disease, or end stage renal disease: Secondary | ICD-10-CM | POA: Diagnosis present

## 2024-05-01 DIAGNOSIS — A4151 Sepsis due to Escherichia coli [E. coli]: Secondary | ICD-10-CM | POA: Diagnosis not present

## 2024-05-01 DIAGNOSIS — Z833 Family history of diabetes mellitus: Secondary | ICD-10-CM

## 2024-05-01 DIAGNOSIS — J9601 Acute respiratory failure with hypoxia: Secondary | ICD-10-CM | POA: Diagnosis present

## 2024-05-01 DIAGNOSIS — E1122 Type 2 diabetes mellitus with diabetic chronic kidney disease: Secondary | ICD-10-CM | POA: Diagnosis present

## 2024-05-01 DIAGNOSIS — E039 Hypothyroidism, unspecified: Secondary | ICD-10-CM | POA: Diagnosis present

## 2024-05-01 DIAGNOSIS — N2581 Secondary hyperparathyroidism of renal origin: Secondary | ICD-10-CM | POA: Diagnosis present

## 2024-05-01 DIAGNOSIS — Z7982 Long term (current) use of aspirin: Secondary | ICD-10-CM

## 2024-05-01 DIAGNOSIS — R001 Bradycardia, unspecified: Secondary | ICD-10-CM | POA: Diagnosis not present

## 2024-05-01 DIAGNOSIS — Z79890 Hormone replacement therapy (postmenopausal): Secondary | ICD-10-CM

## 2024-05-01 DIAGNOSIS — N390 Urinary tract infection, site not specified: Secondary | ICD-10-CM | POA: Diagnosis present

## 2024-05-01 DIAGNOSIS — I5032 Chronic diastolic (congestive) heart failure: Secondary | ICD-10-CM | POA: Diagnosis present

## 2024-05-01 DIAGNOSIS — D631 Anemia in chronic kidney disease: Secondary | ICD-10-CM | POA: Diagnosis present

## 2024-05-01 DIAGNOSIS — Z7901 Long term (current) use of anticoagulants: Secondary | ICD-10-CM

## 2024-05-01 DIAGNOSIS — I878 Other specified disorders of veins: Secondary | ICD-10-CM

## 2024-05-01 DIAGNOSIS — Z992 Dependence on renal dialysis: Secondary | ICD-10-CM | POA: Diagnosis not present

## 2024-05-01 DIAGNOSIS — E785 Hyperlipidemia, unspecified: Secondary | ICD-10-CM | POA: Diagnosis present

## 2024-05-01 DIAGNOSIS — J90 Pleural effusion, not elsewhere classified: Secondary | ICD-10-CM | POA: Diagnosis present

## 2024-05-01 DIAGNOSIS — E11649 Type 2 diabetes mellitus with hypoglycemia without coma: Secondary | ICD-10-CM | POA: Diagnosis present

## 2024-05-01 DIAGNOSIS — D696 Thrombocytopenia, unspecified: Secondary | ICD-10-CM | POA: Diagnosis present

## 2024-05-01 DIAGNOSIS — E8729 Other acidosis: Secondary | ICD-10-CM | POA: Diagnosis present

## 2024-05-01 DIAGNOSIS — J9602 Acute respiratory failure with hypercapnia: Principal | ICD-10-CM

## 2024-05-01 DIAGNOSIS — R6521 Severe sepsis with septic shock: Secondary | ICD-10-CM | POA: Diagnosis present

## 2024-05-01 DIAGNOSIS — I959 Hypotension, unspecified: Secondary | ICD-10-CM | POA: Diagnosis present

## 2024-05-01 DIAGNOSIS — I428 Other cardiomyopathies: Secondary | ICD-10-CM

## 2024-05-01 DIAGNOSIS — Z87891 Personal history of nicotine dependence: Secondary | ICD-10-CM

## 2024-05-01 DIAGNOSIS — J941 Fibrothorax: Secondary | ICD-10-CM | POA: Diagnosis present

## 2024-05-01 DIAGNOSIS — G9341 Metabolic encephalopathy: Secondary | ICD-10-CM | POA: Diagnosis present

## 2024-05-01 DIAGNOSIS — Z91013 Allergy to seafood: Secondary | ICD-10-CM

## 2024-05-01 DIAGNOSIS — I480 Paroxysmal atrial fibrillation: Secondary | ICD-10-CM | POA: Diagnosis present

## 2024-05-01 DIAGNOSIS — M1990 Unspecified osteoarthritis, unspecified site: Secondary | ICD-10-CM | POA: Diagnosis present

## 2024-05-01 DIAGNOSIS — R4182 Altered mental status, unspecified: Secondary | ICD-10-CM

## 2024-05-01 DIAGNOSIS — R4020 Unspecified coma: Secondary | ICD-10-CM | POA: Diagnosis present

## 2024-05-01 DIAGNOSIS — Z8249 Family history of ischemic heart disease and other diseases of the circulatory system: Secondary | ICD-10-CM | POA: Diagnosis not present

## 2024-05-01 DIAGNOSIS — K219 Gastro-esophageal reflux disease without esophagitis: Secondary | ICD-10-CM | POA: Diagnosis present

## 2024-05-01 DIAGNOSIS — R680 Hypothermia, not associated with low environmental temperature: Secondary | ICD-10-CM | POA: Diagnosis not present

## 2024-05-01 DIAGNOSIS — R54 Age-related physical debility: Secondary | ICD-10-CM | POA: Diagnosis present

## 2024-05-01 DIAGNOSIS — Z79899 Other long term (current) drug therapy: Secondary | ICD-10-CM

## 2024-05-01 HISTORY — DX: Unspecified coma: R40.20

## 2024-05-01 HISTORY — DX: Pleural effusion, not elsewhere classified: J90

## 2024-05-01 LAB — CBC WITH DIFFERENTIAL/PLATELET
Abs Immature Granulocytes: 0.03 10*3/uL (ref 0.00–0.07)
Basophils Absolute: 0 10*3/uL (ref 0.0–0.1)
Basophils Relative: 1 %
Eosinophils Absolute: 0.1 10*3/uL (ref 0.0–0.5)
Eosinophils Relative: 1 %
HCT: 30.9 % — ABNORMAL LOW (ref 39.0–52.0)
Hemoglobin: 9.6 g/dL — ABNORMAL LOW (ref 13.0–17.0)
Immature Granulocytes: 0 %
Lymphocytes Relative: 12 %
Lymphs Abs: 0.9 10*3/uL (ref 0.7–4.0)
MCH: 30.2 pg (ref 26.0–34.0)
MCHC: 31.1 g/dL (ref 30.0–36.0)
MCV: 97.2 fL (ref 80.0–100.0)
Monocytes Absolute: 0.5 10*3/uL (ref 0.1–1.0)
Monocytes Relative: 7 %
Neutro Abs: 5.6 10*3/uL (ref 1.7–7.7)
Neutrophils Relative %: 79 %
Platelets: 200 10*3/uL (ref 150–400)
RBC: 3.18 MIL/uL — ABNORMAL LOW (ref 4.22–5.81)
RDW: 16.2 % — ABNORMAL HIGH (ref 11.5–15.5)
WBC: 7.2 10*3/uL (ref 4.0–10.5)
nRBC: 0 % (ref 0.0–0.2)

## 2024-05-01 LAB — MRSA NEXT GEN BY PCR, NASAL: MRSA by PCR Next Gen: NOT DETECTED

## 2024-05-01 LAB — I-STAT CHEM 8, ED
BUN: 41 mg/dL — ABNORMAL HIGH (ref 8–23)
Calcium, Ion: 1.17 mmol/L (ref 1.15–1.40)
Chloride: 97 mmol/L — ABNORMAL LOW (ref 98–111)
Creatinine, Ser: 9.6 mg/dL — ABNORMAL HIGH (ref 0.61–1.24)
Glucose, Bld: 126 mg/dL — ABNORMAL HIGH (ref 70–99)
HCT: 32 % — ABNORMAL LOW (ref 39.0–52.0)
Hemoglobin: 10.9 g/dL — ABNORMAL LOW (ref 13.0–17.0)
Potassium: 3.6 mmol/L (ref 3.5–5.1)
Sodium: 139 mmol/L (ref 135–145)
TCO2: 34 mmol/L — ABNORMAL HIGH (ref 22–32)

## 2024-05-01 LAB — COMPREHENSIVE METABOLIC PANEL WITH GFR
ALT: 13 U/L (ref 0–44)
AST: 19 U/L (ref 15–41)
Albumin: 3.2 g/dL — ABNORMAL LOW (ref 3.5–5.0)
Alkaline Phosphatase: 56 U/L (ref 38–126)
Anion gap: 13 (ref 5–15)
BUN: 38 mg/dL — ABNORMAL HIGH (ref 8–23)
CO2: 29 mmol/L (ref 22–32)
Calcium: 8.5 mg/dL — ABNORMAL LOW (ref 8.9–10.3)
Chloride: 96 mmol/L — ABNORMAL LOW (ref 98–111)
Creatinine, Ser: 10.06 mg/dL — ABNORMAL HIGH (ref 0.61–1.24)
GFR, Estimated: 5 mL/min — ABNORMAL LOW (ref >60)
Glucose, Bld: 122 mg/dL — ABNORMAL HIGH (ref 70–99)
Potassium: 3.7 mmol/L (ref 3.5–5.1)
Sodium: 138 mmol/L (ref 135–145)
Total Bilirubin: 0.5 mg/dL (ref 0.0–1.2)
Total Protein: 7 g/dL (ref 6.5–8.1)

## 2024-05-01 LAB — I-STAT ARTERIAL BLOOD GAS, ED
Acid-Base Excess: 9 mmol/L — ABNORMAL HIGH (ref 0.0–2.0)
Bicarbonate: 31.2 mmol/L — ABNORMAL HIGH (ref 20.0–28.0)
Calcium, Ion: 1.1 mmol/L — ABNORMAL LOW (ref 1.15–1.40)
HCT: 27 % — ABNORMAL LOW (ref 39.0–52.0)
Hemoglobin: 9.2 g/dL — ABNORMAL LOW (ref 13.0–17.0)
O2 Saturation: 100 %
Potassium: 3.2 mmol/L — ABNORMAL LOW (ref 3.5–5.1)
Sodium: 137 mmol/L (ref 135–145)
TCO2: 32 mmol/L (ref 22–32)
pCO2 arterial: 33.1 mmHg (ref 32–48)
pH, Arterial: 7.583 — ABNORMAL HIGH (ref 7.35–7.45)
pO2, Arterial: 517 mmHg — ABNORMAL HIGH (ref 83–108)

## 2024-05-01 LAB — HEPATITIS B SURFACE ANTIGEN: Hepatitis B Surface Ag: NONREACTIVE

## 2024-05-01 LAB — URINALYSIS, W/ REFLEX TO CULTURE (INFECTION SUSPECTED)
Bilirubin Urine: NEGATIVE
Glucose, UA: NEGATIVE mg/dL
Ketones, ur: NEGATIVE mg/dL
Nitrite: NEGATIVE
Protein, ur: >=300 mg/dL — AB
RBC / HPF: >50 RBC/hpf (ref 0–5)
Specific Gravity, Urine: 1.027 (ref 1.005–1.030)
pH: 8 (ref 5.0–8.0)

## 2024-05-01 LAB — ECHOCARDIOGRAM LIMITED
AR max vel: 1.45 cm2
AV Area VTI: 1.3 cm2
AV Area mean vel: 1.23 cm2
AV Mean grad: 5 mmHg
AV Peak grad: 10.4 mmHg
Ao pk vel: 1.61 m/s
Calc EF: 67.5 %
S' Lateral: 3.1 cm
Single Plane A2C EF: 64.3 %
Single Plane A4C EF: 68.7 %

## 2024-05-01 LAB — TROPONIN I (HIGH SENSITIVITY)
Troponin I (High Sensitivity): 27 ng/L — ABNORMAL HIGH (ref <18)
Troponin I (High Sensitivity): 61 ng/L — ABNORMAL HIGH (ref <18)

## 2024-05-01 LAB — RAPID URINE DRUG SCREEN, HOSP PERFORMED
Amphetamines: NOT DETECTED
Barbiturates: NOT DETECTED
Benzodiazepines: NOT DETECTED
Cocaine: NOT DETECTED
Opiates: NOT DETECTED
Tetrahydrocannabinol: NOT DETECTED

## 2024-05-01 LAB — I-STAT VENOUS BLOOD GAS, ED
Acid-Base Excess: 3 mmol/L — ABNORMAL HIGH (ref 0.0–2.0)
Bicarbonate: 33.1 mmol/L — ABNORMAL HIGH (ref 20.0–28.0)
Calcium, Ion: 1.17 mmol/L (ref 1.15–1.40)
HCT: 31 % — ABNORMAL LOW (ref 39.0–52.0)
Hemoglobin: 10.5 g/dL — ABNORMAL LOW (ref 13.0–17.0)
O2 Saturation: 98 %
Potassium: 3.6 mmol/L (ref 3.5–5.1)
Sodium: 139 mmol/L (ref 135–145)
TCO2: 36 mmol/L — ABNORMAL HIGH (ref 22–32)
pCO2, Ven: 91.2 mmHg (ref 44–60)
pH, Ven: 7.168 — CL (ref 7.25–7.43)
pO2, Ven: 130 mmHg — ABNORMAL HIGH (ref 32–45)

## 2024-05-01 LAB — GLUCOSE, CAPILLARY
Glucose-Capillary: 107 mg/dL — ABNORMAL HIGH (ref 70–99)
Glucose-Capillary: 60 mg/dL — ABNORMAL LOW (ref 70–99)
Glucose-Capillary: 64 mg/dL — ABNORMAL LOW (ref 70–99)
Glucose-Capillary: 73 mg/dL (ref 70–99)
Glucose-Capillary: 76 mg/dL (ref 70–99)

## 2024-05-01 LAB — PROCALCITONIN: Procalcitonin: 0.41 ng/mL

## 2024-05-01 LAB — AMMONIA: Ammonia: 41 umol/L — ABNORMAL HIGH (ref 9–35)

## 2024-05-01 LAB — CBG MONITORING, ED: Glucose-Capillary: 82 mg/dL (ref 70–99)

## 2024-05-01 LAB — I-STAT CG4 LACTIC ACID, ED: Lactic Acid, Venous: 1.1 mmol/L (ref 0.5–1.9)

## 2024-05-01 LAB — HEMOGLOBIN A1C
Hgb A1c MFr Bld: 4.5 % — ABNORMAL LOW (ref 4.8–5.6)
Mean Plasma Glucose: 82.45 mg/dL

## 2024-05-01 LAB — TSH: TSH: 6.917 u[IU]/mL — ABNORMAL HIGH (ref 0.350–4.500)

## 2024-05-01 LAB — ETHANOL: Alcohol, Ethyl (B): <15 mg/dL (ref <15)

## 2024-05-01 MED ORDER — PANTOPRAZOLE SODIUM 40 MG IV SOLR
40.0000 mg | Freq: Every day | INTRAVENOUS | Status: DC
  Administered 2024-05-01 – 2024-05-02 (×2): 40 mg via INTRAVENOUS
  Filled 2024-05-01 (×2): qty 10

## 2024-05-01 MED ORDER — PIPERACILLIN-TAZOBACTAM 3.375 G IVPB 30 MIN
3.3750 g | Freq: Once | INTRAVENOUS | Status: AC
  Administered 2024-05-01: 3.375 g via INTRAVENOUS
  Filled 2024-05-01: qty 50

## 2024-05-01 MED ORDER — CHLORHEXIDINE GLUCONATE CLOTH 2 % EX PADS
6.0000 | MEDICATED_PAD | Freq: Every day | CUTANEOUS | Status: DC
  Administered 2024-05-02 – 2024-05-03 (×2): 6 via TOPICAL

## 2024-05-01 MED ORDER — FENTANYL BOLUS VIA INFUSION: 25.0000 ug | INTRAVENOUS | Status: DC | PRN

## 2024-05-01 MED ORDER — VANCOMYCIN VARIABLE DOSE PER UNSTABLE RENAL FUNCTION (PHARMACIST DOSING): Status: DC

## 2024-05-01 MED ORDER — FENTANYL CITRATE PF 50 MCG/ML IJ SOSY
25.0000 ug | PREFILLED_SYRINGE | INTRAMUSCULAR | Status: DC | PRN
  Administered 2024-05-01: 50 ug via INTRAVENOUS
  Filled 2024-05-01: qty 1

## 2024-05-01 MED ORDER — ACETAMINOPHEN 325 MG PO TABS: 650.0000 mg | ORAL_TABLET | Freq: Four times a day (QID) | ORAL | Status: DC | PRN

## 2024-05-01 MED ORDER — ROCURONIUM BROMIDE 10 MG/ML (PF) SYRINGE
PREFILLED_SYRINGE | INTRAVENOUS | Status: AC | PRN
  Administered 2024-05-01: 70 mg via INTRAVENOUS

## 2024-05-01 MED ORDER — DEXTROSE 50 % IV SOLN
INTRAVENOUS | Status: AC
  Filled 2024-05-01: qty 50

## 2024-05-01 MED ORDER — FENTANYL 2500MCG IN NS 250ML (10MCG/ML) PREMIX INFUSION
0.0000 ug/h | INTRAVENOUS | Status: DC
  Administered 2024-05-01: 25 ug/h via INTRAVENOUS

## 2024-05-01 MED ORDER — ORAL CARE MOUTH RINSE
15.0000 mL | OROMUCOSAL | Status: DC
  Administered 2024-05-01 – 2024-05-02 (×11): 15 mL via OROMUCOSAL

## 2024-05-01 MED ORDER — VANCOMYCIN HCL 1500 MG/300ML IV SOLN
1500.0000 mg | Freq: Once | INTRAVENOUS | Status: AC
  Administered 2024-05-01: 1500 mg via INTRAVENOUS
  Filled 2024-05-01: qty 300

## 2024-05-01 MED ORDER — INSULIN ASPART 100 UNIT/ML IJ SOLN
0.0000 [IU] | INTRAMUSCULAR | Status: DC
  Administered 2024-05-02: 2 [IU] via SUBCUTANEOUS
  Administered 2024-05-05 (×4): 1 [IU] via SUBCUTANEOUS

## 2024-05-01 MED ORDER — LEVOTHYROXINE SODIUM 50 MCG PO TABS
50.0000 ug | ORAL_TABLET | Freq: Every day | ORAL | Status: DC
  Administered 2024-05-02: 50 ug
  Filled 2024-05-01: qty 1

## 2024-05-01 MED ORDER — FENTANYL 2500MCG IN NS 250ML (10MCG/ML) PREMIX INFUSION
0.0000 ug/h | INTRAVENOUS | Status: DC
Start: 2024-05-01 — End: 2024-05-01
  Administered 2024-05-01: 50 ug/h via INTRAVENOUS

## 2024-05-01 MED ORDER — SUCROFERRIC OXYHYDROXIDE 500 MG PO CHEW: 1000.0000 mg | CHEWABLE_TABLET | Freq: Three times a day (TID) | ORAL | Status: DC

## 2024-05-01 MED ORDER — NOREPINEPHRINE 4 MG/250ML-% IV SOLN
INTRAVENOUS | Status: AC | PRN
  Administered 2024-05-01: 15 ug/min via INTRAVENOUS

## 2024-05-01 MED ORDER — DEXTROSE 50 % IV SOLN
12.5000 g | INTRAVENOUS | Status: AC
  Administered 2024-05-01: 12.5 g via INTRAVENOUS

## 2024-05-01 MED ORDER — PHENTOLAMINE MESYLATE 5 MG IJ SOLR
5.0000 mg | Freq: Once | INTRAMUSCULAR | Status: AC
  Administered 2024-05-02: 5 mg via SUBCUTANEOUS
  Filled 2024-05-01: qty 5

## 2024-05-01 MED ORDER — CHLORHEXIDINE GLUCONATE CLOTH 2 % EX PADS
6.0000 | MEDICATED_PAD | Freq: Every day | CUTANEOUS | Status: DC
  Administered 2024-05-01: 6 via TOPICAL

## 2024-05-01 MED ORDER — LACTULOSE 10 GM/15ML PO SOLN
30.0000 g | Freq: Two times a day (BID) | ORAL | Status: DC
  Administered 2024-05-01 – 2024-05-02 (×4): 30 g
  Filled 2024-05-01 (×4): qty 45

## 2024-05-01 MED ORDER — FENTANYL CITRATE PF 50 MCG/ML IJ SOSY: 25.0000 ug | PREFILLED_SYRINGE | Freq: Once | INTRAMUSCULAR | Status: DC

## 2024-05-01 MED ORDER — DOXERCALCIFEROL 4 MCG/2ML IV SOLN
6.0000 ug | INTRAVENOUS | Status: DC
  Administered 2024-05-07: 6 ug via INTRAVENOUS
  Filled 2024-05-01 (×2): qty 4

## 2024-05-01 MED ORDER — POLYETHYLENE GLYCOL 3350 17 G PO PACK: 17.0000 g | PACK | Freq: Every day | ORAL | Status: DC | PRN

## 2024-05-01 MED ORDER — CINACALCET HCL 30 MG PO TABS: 30.0000 mg | ORAL_TABLET | ORAL | Status: DC

## 2024-05-01 MED ORDER — APIXABAN 5 MG PO TABS
5.0000 mg | ORAL_TABLET | Freq: Two times a day (BID) | ORAL | Status: DC
  Administered 2024-05-01 – 2024-05-02 (×4): 5 mg
  Filled 2024-05-01 (×4): qty 1

## 2024-05-01 MED ORDER — ETOMIDATE 2 MG/ML IV SOLN
INTRAVENOUS | Status: AC | PRN
Start: 2024-05-01 — End: 2024-05-01
  Administered 2024-05-01: 20 mg via INTRAVENOUS

## 2024-05-01 MED ORDER — POTASSIUM CHLORIDE 20 MEQ PO PACK
40.0000 meq | PACK | ORAL | Status: AC
  Administered 2024-05-01 (×2): 40 meq
  Filled 2024-05-01 (×3): qty 2

## 2024-05-01 MED ORDER — HYALURONIDASE HUMAN 150 UNIT/ML IJ SOLN
150.0000 [IU] | Freq: Once | INTRAMUSCULAR | Status: AC
  Administered 2024-05-01: 150 [IU] via SUBCUTANEOUS
  Filled 2024-05-01: qty 1

## 2024-05-01 MED ORDER — PIPERACILLIN-TAZOBACTAM IN DEX 2-0.25 GM/50ML IV SOLN
2.2500 g | Freq: Three times a day (TID) | INTRAVENOUS | Status: DC
  Administered 2024-05-01 – 2024-05-02 (×3): 2.25 g via INTRAVENOUS
  Filled 2024-05-01 (×4): qty 50

## 2024-05-01 MED ORDER — DOCUSATE SODIUM 50 MG/5ML PO LIQD
100.0000 mg | Freq: Two times a day (BID) | ORAL | Status: DC | PRN
Start: 2024-05-01 — End: 2024-05-03

## 2024-05-01 MED ORDER — DOCUSATE SODIUM 50 MG/5ML PO LIQD
100.0000 mg | Freq: Every day | ORAL | Status: DC
  Administered 2024-05-01: 100 mg
  Filled 2024-05-01: qty 10

## 2024-05-01 MED ORDER — FENTANYL CITRATE PF 50 MCG/ML IJ SOSY: 25.0000 ug | PREFILLED_SYRINGE | INTRAMUSCULAR | Status: DC | PRN

## 2024-05-01 MED ORDER — POTASSIUM CHLORIDE CRYS ER 20 MEQ PO TBCR: 40.0000 meq | EXTENDED_RELEASE_TABLET | ORAL | Status: DC

## 2024-05-01 MED ORDER — PHENYLEPHRINE 80 MCG/ML (10ML) SYRINGE FOR IV PUSH (FOR BLOOD PRESSURE SUPPORT)
PREFILLED_SYRINGE | INTRAVENOUS | Status: AC | PRN
  Administered 2024-05-01 (×2): 160 ug via INTRAVENOUS

## 2024-05-01 MED ORDER — NOREPINEPHRINE 4 MG/250ML-% IV SOLN
0.0000 ug/min | INTRAVENOUS | Status: DC
  Administered 2024-05-01: 2 ug/min via INTRAVENOUS
  Filled 2024-05-01: qty 250

## 2024-05-01 MED ORDER — POLYETHYLENE GLYCOL 3350 17 G PO PACK
17.0000 g | PACK | Freq: Every day | ORAL | Status: DC
  Administered 2024-05-01: 17 g
  Filled 2024-05-01: qty 1

## 2024-05-01 MED ORDER — RENA-VITE PO TABS
1.0000 | ORAL_TABLET | Freq: Every day | ORAL | Status: DC
  Administered 2024-05-01 – 2024-05-02 (×2): 1
  Filled 2024-05-01 (×2): qty 1

## 2024-05-01 MED ORDER — ASPIRIN 81 MG PO CHEW
81.0000 mg | CHEWABLE_TABLET | Freq: Every day | ORAL | Status: DC
Start: 2024-05-01 — End: 2024-05-03
  Administered 2024-05-01 – 2024-05-02 (×2): 81 mg
  Filled 2024-05-01 (×2): qty 1

## 2024-05-01 MED ORDER — FAMOTIDINE 20 MG PO TABS
10.0000 mg | ORAL_TABLET | Freq: Every day | ORAL | Status: DC
  Administered 2024-05-01: 10 mg
  Filled 2024-05-01: qty 1

## 2024-05-01 MED ORDER — HEPARIN SODIUM (PORCINE) 5000 UNIT/ML IJ SOLN: 5000.0000 [IU] | Freq: Three times a day (TID) | INTRAMUSCULAR | Status: DC

## 2024-05-01 MED ORDER — ORAL CARE MOUTH RINSE: 15.0000 mL | OROMUCOSAL | Status: DC | PRN

## 2024-05-01 MED ORDER — FAMOTIDINE 20 MG PO TABS: 20.0000 mg | ORAL_TABLET | Freq: Two times a day (BID) | ORAL | Status: DC

## 2024-05-01 NOTE — ED Provider Notes (Signed)
 [facility] Provider Note   CSN: [id number] Arrival date & time: 05/01/24  8469     History  Chief Complaint  Patient presents with   Altered Mental Status    [name] is a 73 y.o. male.  HPI 73 year old male presents with altered mental status.  Fortunately, no family or EMS is present when I am seeing the patient and so the history is primarily from the nurse who spoke to EMS.  I tried to call the nursing and rehabilitation center that he comes from but there was no answer.  Patient has been unresponsive since reportedly last being seen around 6 AM.  He missed dialysis yesterday though it is unclear why.  Currently the patient is unresponsive and further history is unavailable.  Per chart review he has multiple comorbidities including but not limited to ESRD on dialysis, diabetes, hypertension, PVD. There are no narcotics on his home medication list.  Home Medications Prior to Admission medications   Medication Sig Start Date End Date Taking? Authorizing Provider  acetaminophen  (TYLENOL ) 325 MG tablet Take 650 mg by mouth every 6 (six) hours as needed for mild pain (pain score 1-3) or fever.    [provider]  apixaban  (ELIQUIS ) 5 MG TABS tablet Take 1 tablet (5 mg total) by mouth 2 (two) times daily. 06/27/21   Montey Apa, DO  aspirin  81 MG chewable tablet Chew 81 mg by mouth daily.    [provider]  atorvastatin  (LIPITOR ) 80 MG tablet Take 80 mg by mouth at bedtime.    [provider]  cinacalcet  (SENSIPAR ) 30 MG tablet Take 1 tablet (30 mg total) by mouth every Tuesday, Thursday, and Saturday at 6 PM. 04/18/24   [name], MD  docusate sodium  (COLACE) 100 MG capsule Take 100 mg by mouth daily.    [provider]  glucagon (GLUCAGEN) 1 MG SOLR injection Inject 1 mg into the vein daily as needed for low blood sugar.    [provider]  hydrALAZINE  (APRESOLINE ) 25 MG  tablet Take 1 tablet (25 mg total) by mouth 3 (three) times daily. 04/18/24   [name], MD  levothyroxine  (SYNTHROID ) 50 MCG tablet Take 50 mcg by mouth daily before breakfast.    [provider]  lisinopril  (ZESTRIL ) 40 MG tablet Take 40 mg by mouth at bedtime.    [provider]  metoprolol  succinate (TOPROL -XL) 25 MG 24 hr tablet Take 1 tablet (25 mg total) by mouth daily. 06/28/21   Montey Apa, DO  Multiple Vitamins-Minerals (MULTIVITAMIN WITH MINERALS) tablet Take 1 tablet by mouth daily in the afternoon.    [provider]  sucroferric oxyhydroxide (VELPHORO ) 500 MG chewable tablet Chew 1,000 mg by mouth 3 (three) times daily with meals.    [provider]      Allergies    Shellfish allergy and Shrimp (diagnostic)    Review of Systems   Review of Systems  Unable to perform ROS: Mental status change    Physical Exam Updated Vital Signs BP (!) 123/59   Pulse 79   Temp (!) 94.2 F (34.6 C) (Rectal)   Resp (!) 6   SpO2 100%  Physical Exam Vitals and nursing note reviewed.  Constitutional:      Appearance: He is well-developed.  HENT:     Head: Normocephalic and atraumatic.  Eyes:     Comments: Miotic pupils bilaterally, the left pupil seems oval-shaped.  Cardiovascular:     Rate and Rhythm: Normal rate and regular rhythm.     Heart sounds: Murmur heard.  Pulmonary:     Breath sounds: No wheezing.     Comments: Poor inspiratory effort, breathing only 6-9 times per minute. Abdominal:     General: There is no distension.  Musculoskeletal:     Comments: Bilateral lower extremity edema, the right leg is wrapped in gauze.  Skin:    General: Skin is warm and dry.  Neurological:     Mental Status: He is unresponsive.     GCS: GCS eye subscore is 1. GCS verbal subscore is 2. GCS motor subscore is 4.     ED Results / Procedures / Treatments   Labs (all labs ordered are listed, but only abnormal results are displayed) Labs  Reviewed  CBC WITH DIFFERENTIAL/PLATELET - Abnormal; Notable for the following components:      Result Value   RBC 3.18 (*)    Hemoglobin 9.6 (*)    HCT 30.9 (*)    RDW 16.2 (*)    All other components within normal limits  I-STAT CHEM 8, ED - Abnormal; Notable for the following components:   Chloride 97 (*)    BUN 41 (*)    Creatinine, Ser 9.60 (*)    Glucose, Bld 126 (*)    TCO2 34 (*)    Hemoglobin 10.9 (*)    HCT 32.0 (*)    All other components within normal limits  I-STAT VENOUS BLOOD GAS, ED - Abnormal; Notable for the following components:   pH, Ven 7.168 (*)    pCO2, Ven 91.2 (*)    pO2, Ven 130 (*)    Bicarbonate 33.1 (*)    TCO2 36 (*)    Acid-Base Excess 3.0 (*)    HCT 31.0 (*)    Hemoglobin 10.5 (*)    All other components within normal limits  CULTURE, BLOOD (ROUTINE X 2)  CULTURE, BLOOD (ROUTINE X 2)  AMMONIA  URINALYSIS, W/ REFLEX TO CULTURE (INFECTION SUSPECTED)  RAPID URINE DRUG SCREEN, HOSP PERFORMED  COMPREHENSIVE METABOLIC PANEL WITH GFR  ETHANOL  I-STAT CG4 LACTIC ACID, ED  I-STAT ARTERIAL BLOOD GAS, ED  TROPONIN I (HIGH SENSITIVITY)    EKG EKG Interpretation Date/Time:  Wednesday May 01 2024 07:38:26 EDT Ventricular Rate:  82 PR Interval:  271 QRS Duration:  107 QT Interval:  401 QTC Calculation: 469 R Axis:   43  Text Interpretation: Sinus rhythm Atrial premature complex Prolonged PR interval Borderline repolarization abnormality Confirmed by Jerilynn Montenegro [id number]) on 05/01/2024 7:49:36 AM  Radiology No results found.  Procedures Procedure Name: Intubation Date/Time: 05/01/2024 8:25 AM  Performed by: Jerilynn Montenegro, MDPre-anesthesia Checklist: Patient identified, Patient being monitored, Emergency Drugs available, Timeout performed and Suction available Oxygen Delivery Method: Non-rebreather mask Preoxygenation: Pre-oxygenation with 100% oxygen Induction Type: Rapid sequence Ventilation: Mask ventilation without  difficulty Laryngoscope Size: Glidescope and 4 Grade View: Grade I Tube size: 7.5 mm Number of attempts: 1 Airway Equipment and Method: Video-laryngoscopy Placement Confirmation: ETT inserted through vocal cords under direct vision, CO2 detector and Breath sounds checked- equal and bilateral Secured at: 25 cm Tube secured with: ETT holder Dental Injury: Teeth and Oropharynx as per pre-operative assessment     .Critical Care  Performed by: Jerilynn Montenegro, MD Authorized by: Jerilynn Montenegro, MD   Critical care provider statement:    Critical care time (minutes):  45   Critical care time was exclusive of:  Separately billable  procedures and treating other patients   Critical care was necessary to treat or prevent imminent or life-threatening deterioration of the following conditions:  CNS failure or compromise and respiratory failure   Critical care was time spent personally by me on the following activities:  Development of treatment plan with patient or surrogate, discussions with consultants, evaluation of patient's response to treatment, examination of patient, ordering and review of laboratory studies, ordering and review of radiographic studies, ordering and performing treatments and interventions, pulse oximetry, re-evaluation of patient's condition and review of old charts     Medications Ordered in ED Medications  fentaNYL  in NS (11mcg/ml) infusion-PREMIX (50 mcg/hr Intravenous New Bag/Given 05/01/24 0817)  fentaNYL  (SUBLIMAZE ) bolus via infusion 25-100 mcg (has no administration in time range)  etomidate (AMIDATE) injection (20 mg Intravenous Given 05/01/24 0801)  rocuronium  (ZEMURON ) injection (70 mg Intravenous Given 05/01/24 0801)  PHENYLephrine  80 mcg/ml in normal saline Adult IV Push Syringe (For Blood Pressure Support) (160 mcg Intravenous Given 05/01/24 0815)    ED Course/ Medical Decision Making/ A&P                                 Medical Decision  Making Amount and/or Complexity of Data Reviewed External Data Reviewed: notes. Labs: ordered.    Details: Stable anemia Respiratory acidosis Radiology: ordered and independent interpretation performed.    Details: Right pleural effusion No head bleed ECG/medicine tests: ordered and independent interpretation performed.    Details: No signs of hyperkalemia or ischemia  Risk Prescription drug management. Decision regarding hospitalization.   Patient presents unresponsive and GCS 7.  His respiratory rate has poor inspiratory effort and given all this combined, he was intubated for airway protection.  There are no narcotics on his med list so I think a narcotic overdose is unlikely.  ABG confirms significant respiratory acidosis.  This improved with ventilator management.  Shortly after intubation he had a profound hypotension and required a couple doses of phenylephrine  push and was started on Levophed which was then titrated off.  He did unfortunately overshoot but now is trending back down.  At this point there is no indication this is an infection and I have discussed with ICU and they advise a procalcitonin and will evaluate in the ED and decide on antibiotics.  Otherwise, CT head without head bleed.  Will admit to the ICU.        Final Clinical Impression(s) / ED Diagnoses Final diagnoses:  Acute respiratory failure with hypercapnia [facility])    Rx / DC Orders ED Discharge Orders     None         Jerilynn Montenegro, MD 05/01/24 1137

## 2024-05-01 NOTE — Progress Notes (Signed)
 At bedside for PIV insertion. Attempted x 2 with no success. Very poor vasculature. Vessels non-compressible. Cephalic vessel in RFA attempted and blew. Brachial vessel in RFA too small to cannulate at this time. Central access is recommended for further access needs. RN aware.

## 2024-05-01 NOTE — ED Notes (Signed)
 Only able to get one set of set of cultuters

## 2024-05-01 NOTE — Procedures (Addendum)
 Patient Name: RAYDEL HOSICK  MRN: [id number]  Epilepsy Attending: Arleene Lack  Referring Physician/Provider: Josiah Nigh, MD  Date: 05/01/2024 Duration: 23.08 mins  Patient history: 73yo M with ams. EEG to evaluate for seizure  Level of alertness: Awake/lethargic  AEDs during EEG study: None  Technical aspects: This EEG study was done with scalp electrodes positioned according to the 10-20 International system of electrode placement. Electrical activity was reviewed with band pass filter of 1-70Hz , sensitivity of 7 uV/mm, display speed of 34mm/sec with a 60Hz  notched filter applied as appropriate. EEG data were recorded continuously and digitally stored.  Video monitoring was available and reviewed as appropriate.  Description: EEG showed continuous generalized 3 to 6 Hz theta-delta slowing. Hyperventilation and photic stimulation were not performed.     ABNORMALITY - Continuous slow, generalized  IMPRESSION: This study is suggestive of moderate diffuse encephalopathy. No seizures or epileptiform discharges were seen throughout the recording.  Kaleesi Guyton O Isreal Moline

## 2024-05-01 NOTE — Progress Notes (Signed)
 Pharmacy Antibiotic Note  [name] is a 73 y.o. male admitted on 05/01/2024 presenting unresponsive, concern for sepsis.  Pharmacy has been consulted for vancomycin  and zosyn  dosing.  Plan: Vancomycin  1500 mg IV x 1, then 750 mg IV qHD Zosyn  3.375g IV x 1, then 2.25g IV q 8h Monitor HD plan, Cx and clinical progression to narrow Vancomycin  levels as indicated     Temp (24hrs), Avg:94.2 F (34.6 C), Min:94.2 F (34.6 C), Max:94.2 F (34.6 C)  Recent Labs  Lab 05/01/24 0754 05/01/24 0755 05/01/24 0801  WBC  --   --  7.2  CREATININE 9.60*  --  10.06*  LATICACIDVEN  --  1.1  --     Estimated Creatinine Clearance: 6.5 mL/min (A) (by C-G formula based on SCr of 10.06 mg/dL (H)).    Allergies  Allergen Reactions   Shellfish Allergy Swelling   Shrimp (Diagnostic) Swelling    Trinidad Funk, PharmD, [facility] Clinical Pharmacist ED Pharmacist Phone # [phone] 05/01/2024 10:12 AM

## 2024-05-01 NOTE — Consult Note (Signed)
 [facility] Renal Consultation Note  Requesting MD: Ardelle Kos, MD Indication for Consultation:  ESRD  Chief complaint: altered mental status   HPI:  [name] is a 73 y.o. male with a history of end-stage renal disease (HD TTS at [place]), hypertension, diabetes mellitus, afib, right pleural effusion, and chronic diastolic heart failure who presented to the hospital from his SNF with altered mental status.  Per report, he missed dialysis yesterday.  CT head was negative for acute process.  Ammonia 41.  Pulmonary is concerned re: CO2 retention.  He was intubated and admitted to the ICU.  Nephrology is consulted for assistance with management of ESRD.  He was recently admitted here with a right pleural effusion requiring thoracentesis and chest tube placement.  Per charting from last admission he was frequently refusing and/or signing off to work 2-1/2 hours early while admitted last and early May.  His dry weight had been lowered per charting.  Blood pressure was labile following intubation however pressures have been 140's most recently per nursing.  I spoke with his significant other [name] via phone.  She states that the facility called her to let her know he was going to the hospital but hadn't spoken with anyone after that.  Discussed that he is intubated in the ICU and provided update.  She appreciated the call.    PMHx:   Past Medical History:  Diagnosis Date   Arthritis    Asthma    as a child   Chronic cystitis    Chronic diastolic heart failure (HCC)    Chronic kidney disease    HD pt, 3 times a week.   Diabetes mellitus    Dysrhythmia    GERD (gastroesophageal reflux disease)    H/O hiatal hernia    states it's been fixed   Hyperlipidemia    Hypertension    Lymphedema    Morbid obesity (HCC)    NEPHROLITHIASIS, HX OF 12/02/2009   Qualifier: Diagnosis of  By: [name], Cat     PVD (peripheral vascular disease) (HCC)    Renal insufficiency    UMBILICAL  HERNIA 02/08/2007   Qualifier: History of  By: Peggy Bowens MD, Shane     Venous insufficiency     Past Surgical History:  Procedure Laterality Date   A/V FISTULAGRAM N/A 02/02/2024   Procedure: A/V Fistulagram;  Surgeon: Baron Border, MD;  Location: [facility] INVASIVE CV LAB;  Service: Cardiovascular;  Laterality: N/A;   ABDOMINAL AORTOGRAM W/LOWER EXTREMITY N/A 05/19/2017   Procedure: Abdominal Aortogram w/Lower Extremity;  Surgeon: Richrd Char, MD;  Location: [facility] INVASIVE CV LAB;  Service: Cardiovascular;  Laterality: N/A;   ABDOMINAL AORTOGRAM W/LOWER EXTREMITY N/A 06/18/2021   Procedure: ABDOMINAL AORTOGRAM W/LOWER EXTREMITY;  Surgeon: Richrd Char, MD;  Location: [facility] INVASIVE CV LAB;  Service: Cardiovascular;  Laterality: N/A;   ABDOMINAL AORTOGRAM W/LOWER EXTREMITY N/A 04/17/2023   Procedure: ABDOMINAL AORTOGRAM W/LOWER EXTREMITY;  Surgeon: Adine Hoof, MD;  Location: [facility] INVASIVE CV LAB;  Service: Cardiovascular;  Laterality: N/A;   ABDOMINAL AORTOGRAM W/LOWER EXTREMITY N/A 05/22/2023   Procedure: ABDOMINAL AORTOGRAM W/LOWER EXTREMITY;  Surgeon: Adine Hoof, MD;  Location: [facility] INVASIVE CV LAB;  Service: Cardiovascular;  Laterality: N/A;   AMPUTATION     Right and left fifth toes.    AMPUTATION Left 06/18/2021   Procedure: Amputation transmetatarsal of toes 2 3 and 4 left foot ;  Surgeon: Richrd Char, MD;  Location: [facility] OR;  Service:  Vascular;  Laterality: Left;   AMPUTATION TOE     emoval of both little toes   AV FISTULA PLACEMENT Left 03/21/2014   Procedure: ARTERIOVENOUS (AV) FISTULA CREATION with ultrasound;  Surgeon: Mayo Speck, MD;  Location: [facility] OR;  Service: Vascular;  Laterality: Left;   COLONOSCOPY     EYE SURGERY Bilateral    cataract and lens implant   FEMORAL-TIBIAL BYPASS GRAFT Left 06/18/2021   Procedure: Left below-knee popliteal to posterior tibial artery bypass with 9 reversed left greater saphenous vein;  Surgeon: Richrd Char, MD;   Location: [facility] OR;  Service: Vascular;  Laterality: Left;   HERNIA REPAIR     IR IMAGE GUIDED FLUID DRAIN BY CATHETER  04/09/2024   IR THORACENTESIS ASP PLEURAL SPACE W/IMG GUIDE  04/05/2024   LIGATION OF COMPETING BRANCHES OF ARTERIOVENOUS FISTULA Left 06/29/2015   Procedure: LIGATION OF LEFT ARM RADIOCEPHALIC ARTERIOVENOUS FISTULA SIDE BRANCHES;  Surgeon: Arvil Lauber, MD;  Location: [facility] OR;  Service: Vascular;  Laterality: Left;   MULTIPLE EXTRACTIONS WITH ALVEOLOPLASTY N/A 04/07/2017   Procedure: Extraction of tooth #'s 1-11, 13, 14,16, 20-23, and 26-28 with alveoloplasty;  Surgeon: Carol Chroman, DDS;  Location: [facility] OR;  Service: Oral Surgery;  Laterality: N/A;   PERIPHERAL VASCULAR INTERVENTION Right 04/17/2023   Procedure: PERIPHERAL VASCULAR INTERVENTION;  Surgeon: Adine Hoof, MD;  Location: [facility] INVASIVE CV LAB;  Service: Cardiovascular;  Laterality: Right;  Right SFA Stent   PERIPHERAL VASCULAR INTERVENTION Left 05/22/2023   Procedure: PERIPHERAL VASCULAR INTERVENTION;  Surgeon: Adine Hoof, MD;  Location: [facility] INVASIVE CV LAB;  Service: Cardiovascular;  Laterality: Left;  SFA   Popliteal to posterior tibial bypass     2006   R knee arthoscopic repair of meniscus     UMBILICAL HERNIA REPAIR      Family Hx:  Family History  Problem Relation Age of Onset   Diabetes Mother    Heart disease Mother    Diabetes Brother     Social History:  reports that he quit smoking about 44 years ago. His smoking use included cigarettes. He has quit using smokeless tobacco. He reports that he does not drink alcohol and does not use drugs.  Allergies:  Allergies  Allergen Reactions   Shellfish Allergy Swelling   Shrimp (Diagnostic) Swelling    Medications: Prior to Admission medications   Medication Sig Start Date End Date Taking? Authorizing Provider  acetaminophen  (TYLENOL ) 325 MG tablet Take 650 mg by mouth every 6 (six) hours as needed for mild pain (pain score 1-3)  or fever.   Yes [provider]  apixaban  (ELIQUIS ) 5 MG TABS tablet Take 1 tablet (5 mg total) by mouth 2 (two) times daily. 06/27/21  Yes Montey Apa, DO  aspirin  81 MG chewable tablet Chew 81 mg by mouth daily.   Yes [provider]  atorvastatin  (LIPITOR ) 80 MG tablet Take 80 mg by mouth at bedtime.   Yes [provider]  cinacalcet  (SENSIPAR ) 30 MG tablet Take 1 tablet (30 mg total) by mouth every Tuesday, Thursday, and Saturday at 6 PM. 04/18/24  Yes [name], MD  docusate sodium  (COLACE) 100 MG capsule Take 100 mg by mouth daily.   Yes [provider]  Glucagon, rDNA, (GLUCAGON EMERGENCY) 1 MG KIT Inject 1 mg into the skin as needed (hypoglycemia). 04/18/24  Yes [provider]  hydrALAZINE  (APRESOLINE ) 25 MG tablet Take 1 tablet (25 mg total) by mouth 3 (three) times daily.  04/18/24  Yes [name], MD  levothyroxine  (SYNTHROID ) 50 MCG tablet Take 50 mcg by mouth daily before breakfast.   Yes [provider]  lisinopril  (ZESTRIL ) 40 MG tablet Take 40 mg by mouth at bedtime.   Yes [provider]  multivitamin (RENA-VIT) TABS tablet Take 1 tablet by mouth daily.   Yes [provider]  sucroferric oxyhydroxide (VELPHORO ) 500 MG chewable tablet Chew 1,000 mg by mouth 3 (three) times daily with meals.   Yes [provider]  metoprolol  succinate (TOPROL -XL) 25 MG 24 hr tablet Take 1 tablet (25 mg total) by mouth daily. 06/28/21   Montey Apa, DO    I have reviewed the patient's current medications.  Labs:     Latest Ref Rng & Units 05/01/2024    9:24 AM 05/01/2024    8:01 AM 05/01/2024    7:55 AM  BMP  Glucose 70 - 99 mg/dL  161    BUN 8 - 23 mg/dL  38    Creatinine 0.96 - 1.24 mg/dL  04.54    Sodium 098 - 145 mmol/L 137  138  139   Potassium 3.5 - 5.1 mmol/L 3.2  3.7  3.6   Chloride 98 - 111 mmol/L  96    CO2 22 - 32 mmol/L  29    Calcium  8.9 - 10.3 mg/dL  8.5        ROS: Unable  to obtain secondary to intubated    Physical Exam: Vitals:   05/01/24 1025 05/01/24 1122  BP: (!) 146/73   Pulse: (!) 58   Resp: 15   Temp:    SpO2: 100% 100%     General:  adult male intubated  HEENT: NCAT Eyes: EOMI sclera anicteric Neck: supple trachea midline  Heart: S1S2 no rub Lungs:  decreased breath sounds right lung; left lung coarse Abdomen: soft/nt/nd Extremities: 2+ edema lower extremities  Skin: no rash on extremities exposed  Neuro: sedation is currently running; he is waking up per nursing  Access LUE AVF with bruit and thrill   Outpatient HD orders:  [place] TTS BF 450 DF auto 1.5 2K/ 2 Ca AVF 4 hours EDW 73 kg  Last post weight of 72.6 kg on 04/27/24  Meds: mircera 75 mcg every 2 weeks (last outpatient mircera on 02/20/24).  venofer  50 mg weekly; hectorol  6 mcg three times a week; sensipar  30 mg three times a week with HD   Assessment/Plan:  # Metabolic encephalopathy  - felt 2/2 co2 retention per pulm - intubated  - he has also missed last HD treatment which is compounding   # ESRD - HD today.  Assess HD needs daily.  Off schedule this week for now.  Anticipate next tx on Friday after today's tx - Usually HD is per TTS schedule  - note hx of noncompliance   # Acute hypercapneic respiratory failure  - on mechanical ventilation per pulm - note hx of right pleural effusion as well  - optimize volume with HD   # HTN  - labile after intubation - avoid hypotension  - optimize volume with HD   # Anemia of CKD  - ESA was held on discharge given high index of suspicion regarding his pleural effusion.   # Metabolic bone disease  - resume binders when taking PO - resume hectorol .  Resume sensipar  next    Thank you for the consult.  Please do not hesitate to contact us  with any questions regarding our patient  Nan Aver 05/01/2024, 12:48 PM

## 2024-05-01 NOTE — Progress Notes (Signed)
   05/01/24 0810  Airway 7.5 mm  Placement Date/Time: 05/01/24 0810   Grade View: Grade 1  Placed By: ED Physician  Airway Device: Endotracheal Tube  Laryngoscope Blade: 4  ETT Types: Oral  Size (mm): 7.5 mm  Cuffed: Cuffed  Insertion attempts: 1  Airway Equipment: Stylet;Video Laryn...  Secured at (cm) 25 cm  Measured From Lips  Secured Location Center  Secured By English as a second language teacher No  Tube Holder Repositioned Yes  Prone position No  Cuff Pressure (cm H2O) Green OR 18-26 CmH2O  Site Condition Dry  Adult Ventilator Settings  Vent Type Servo i  Humidity HME  Vent Mode PRVC  Vt Set 580 mL  Set Rate 28 bmp  FiO2 (%) 100 %  I Time 0.9 Sec(s)  PEEP 5 cmH20

## 2024-05-01 NOTE — Plan of Care (Signed)
  Problem: Education: Goal: Knowledge of General Education information will improve Description: Including pain rating scale, medication(s)/side effects and non-pharmacologic comfort measures Outcome: Not Progressing   Problem: Clinical Measurements: Goal: Ability to maintain clinical measurements within normal limits will improve Outcome: Not Progressing Goal: Diagnostic test results will improve Outcome: Not Progressing Goal: Respiratory complications will improve Outcome: Not Progressing   Problem: Nutrition: Goal: Adequate nutrition will be maintained Outcome: Not Progressing

## 2024-05-01 NOTE — Progress Notes (Signed)
 VAST consult for PIV placement. L arm restricted 2 more VAST nurses assessed RFA infiltrated, RU arm poor vasculature. Notified nurse. Instructed to notify MD needing CVC line. Theophilus Fitz, RN VAST

## 2024-05-01 NOTE — Progress Notes (Signed)
Dear Doctor: This patient has been identified as a candidate for CVC for the following reason (s): IV therapy over 48 hours, drug pH or osmolality (causing phlebitis, infiltration in 24 hours), drug extravasation potential with tissue necrosis (KCL, Dilantin, Dopamine, CaCl, MgSO4, chemo vesicant), poor veins/poor circulatory system (CHF, COPD, emphysema, diabetes, steroid use, IV drug abuse, etc.), restarts due to phlebitis and infiltration in 24 hours, and incompatible drugs (aminophyllin, TPN, heparin, given with an antibiotic) If you agree, please write an order for the indicated device. Thank you for supporting the early vascular access assessment program.

## 2024-05-01 NOTE — Progress Notes (Signed)
  Echocardiogram 2D Echocardiogram has been performed.  Yamilka Lopiccolo L Kasie Leccese RDCS 05/01/2024, 11:05 AM

## 2024-05-01 NOTE — ED Notes (Signed)
 CCMD contacted

## 2024-05-01 NOTE — Progress Notes (Addendum)
 eLink Physician-Brief Progress Note Patient Name: VASHAWN EKSTEIN DOB: [birth year] MRN: [id number]   Date of Service  05/01/2024  HPI/Events of Note  Volume overload with acute metabolic encephalopathy requiring intubation, extremely limited IV access, 1 PIV that was available has now infiltrated.  No IV access available at this time.  Missed ESRD associated HD appointment.  eICU Interventions  Will request cramping evaluation for CBC in the setting of limited IV access   0020 -multiple bouts of hypoglycemia, initiated dextrose  infusion, central line verified  0348 - Increase D10 rate 2/2 hypoglycemia  Intervention Category Intermediate Interventions: Hypotension - evaluation and management  Nahara Dona 05/01/2024, 8:42 PM

## 2024-05-01 NOTE — Procedures (Addendum)
 Central Venous Catheter Insertion Procedure Note  [name]  [id number]  [birth year]  Date:05/01/24  Time:9:51 PM   Provider Performing:Nohelani Benning Dyana Glade Adriane Albe   Procedure: Insertion of Non-tunneled Central Venous Catheter(36556) with US  guidance (13244)   Indication(s) Medication administration and Difficult access  Consent Risks of the procedure as well as the alternatives and risks of each were explained to the patient and/or caregiver.  Consent for the procedure was obtained and is signed in the bedside chart  Anesthesia Topical only with 1% lidocaine    Timeout Verified patient identification, verified procedure, site/side was marked, verified correct patient position, special equipment/implants available, medications/allergies/relevant history reviewed, required imaging and test results available.  Sterile Technique Maximal sterile technique including full sterile barrier drape, hand hygiene, sterile gown, sterile gloves, mask, hair covering, sterile ultrasound probe cover (if used).  Procedure Description Area of catheter insertion was cleaned with chlorhexidine  and draped in sterile fashion.  With real-time ultrasound guidance a central venous catheter was placed into the right internal jugular vein. Nonpulsatile blood flow and easy flushing noted in all ports.  The catheter was sutured in place and sterile dressing applied.  Complications/Tolerance None; patient tolerated the procedure well. Chest X-ray is ordered to verify placement for internal jugular or subclavian cannulation.   Chest x-ray is not ordered for femoral cannulation.  EBL Minimal  Specimen(s) None     [name], AGACNP-BC [facility]  See Amion for personal pager PCCM on call pager [phone] until 7pm. Please call Elink 7p-7a. [phone]  05/01/2024 9:52 PM

## 2024-05-01 NOTE — Progress Notes (Signed)
 EEG complete - results pending

## 2024-05-01 NOTE — H&P (Signed)
 NAME:  [name], MRN:  [id number], DOB:  13-Sep-1951, LOS: 0 ADMISSION DATE:  05/01/2024, CONSULTATION DATE:  05/01/24 REFERRING MD:  EDP, CHIEF COMPLAINT:  ams   History of Present Illness:  73 year old man well known to our service for complex pleural space, ESRD,  presenting from SNF with AMS.  Missed HD yesterday.  VBG with CO2 retention.  Intubated for airway protection.  Head CT neg, ammonia mildly up, CXR stable, hypothermic.  Pertinent  Medical History  Asthma GERD HLD HTN ESRD Afib on AC Chronic R effusion/fibrothorax/possible malignancy but too chronically sick to do VATS  Significant Hospital Events: Including procedures, antibiotic start and stop dates in addition to other pertinent events     Interim History / Subjective:  admit  Objective    Blood pressure (!) 199/84, pulse 60, temperature (!) 94.2 F (34.6 C), temperature source Rectal, resp. rate 20, SpO2 100%.    Vent Mode: PRVC FiO2 (%):  [100 %] 100 % Set Rate:  [28 bmp] 28 bmp Vt Set:  [580 mL] 580 mL PEEP:  [5 cmH20] 5 cmH20 Plateau Pressure:  [38 cmH20] 38 cmH20  No intake or output data in the 24 hours ending 05/01/24 0942 There were no vitals filed for this visit.  Examination: General: comatose on vent HENT: ++JVP, trachea midline, +temporal wasting Lungs: diminished particularly R base; high driving pressures on vent c/w reduced lung compliance Cardiovascular: bedside echo looks takutsubo like, dilated IVC Abdomen: soft, hypoactive BS, NGT bilious fluid  Extremities: 1+ edema Neuro: starting to withdraw to pain GU: couple drops urine from foley  Resolved problem list  N/A  Assessment and Plan  Acute metabolic encephalopathy- from CO2 retention, did miss HD, hypopneic on admit.  No sedating meds at home.  Ammonia minimally elevated.  No lactate leak to suggest seizures.   Acute hypercapneic respiratory failure- due to AMS; improved with mechanical ventilation, ongoing issues with lung  compliance are related to subsequent listed issues Volume overloaded state of heart, ?stress cardiomyopathy on bedside echo, ESRD- suspect should get better after HD, trop flat, EKG nonischemic Chronic R pleural effusion/fibrothorax- see my discussion on 04/16/24. Would not chase for now. R/o sepsis- AMS, hypothermic, reasonable to give abx, hold fluids given stable BP and ESRD/volume overload Hx afib on eliquis   - Vanc/zosyn  - Vent bundle, adjusted parameters due to issues with pressure regulation, repeat ABG pending - Check TSH, EEG, Pct, limited echo - Lactulose - Nephro Dr Yvonnie Heritage to see if we can get some fluid off with HD - If wakes up may be able to wean to extubate - SSI - If ongoing coma issues may need MRI - Defer sacral pressure ulcer assessment to RN when gets to ICU - Will update family   Best Practice (right click and "Reselect all SmartList Selections" daily)   Diet/type: NPO DVT prophylaxis DOAC Pressure ulcer(s): pressure ulcer assessment deferred  GI prophylaxis: N/A Lines: N/A Foley:  Yes, and it is still needed Code Status:  full code Last date of multidisciplinary goals of care discussion [will update]  Labs   CBC: Recent Labs  Lab 05/01/24 0754 05/01/24 0755 05/01/24 0801 05/01/24 0924  WBC  --   --  7.2  --   NEUTROABS  --   --  5.6  --   HGB 10.9* 10.5* 9.6* 9.2*  HCT 32.0* 31.0* 30.9* 27.0*  MCV  --   --  97.2  --   PLT  --   --  200  --     Basic Metabolic Panel: Recent Labs  Lab 05/01/24 0754 05/01/24 0755 05/01/24 0801 05/01/24 0924  NA 139 139 138 137  K 3.6 3.6 3.7 3.2*  CL 97*  --  96*  --   CO2  --   --  29  --   GLUCOSE 126*  --  122*  --   BUN 41*  --  38*  --   CREATININE 9.60*  --  10.06*  --   CALCIUM   --   --  8.5*  --    GFR: Estimated Creatinine Clearance: 6.5 mL/min (A) (by C-G formula based on SCr of 10.06 mg/dL (H)). Recent Labs  Lab 05/01/24 0755 05/01/24 0801  WBC  --  7.2  LATICACIDVEN 1.1  --     Liver  Function Tests: Recent Labs  Lab 05/01/24 0801  AST 19  ALT 13  ALKPHOS 56  BILITOT 0.5  PROT 7.0  ALBUMIN 3.2*   No results for input(s): "LIPASE", "AMYLASE" in the last 168 hours. Recent Labs  Lab 05/01/24 0801  AMMONIA 41*    ABG    Component Value Date/Time   PHART 7.583 (H) 05/01/2024 0924   PCO2ART 33.1 05/01/2024 0924   PO2ART 517 (H) 05/01/2024 0924   HCO3 31.2 (H) 05/01/2024 0924   TCO2 32 05/01/2024 0924   O2SAT 100 05/01/2024 0924     Coagulation Profile: No results for input(s): "INR", "PROTIME" in the last 168 hours.  Cardiac Enzymes: No results for input(s): "CKTOTAL", "CKMB", "CKMBINDEX", "TROPONINI" in the last 168 hours.  HbA1C: Hgb A1c MFr Bld  Date/Time Value Ref Range Status  06/15/2021 01:58 AM 4.9 4.8 - 5.6 % Final    Comment:    (NOTE) Pre diabetes:          5.7%-6.4%  Diabetes:              >6.4%  Glycemic control for   <7.0% adults with diabetes   07/11/2018 12:18 AM 6.2 (H) 4.8 - 5.6 % Final    Comment:    (NOTE) Pre diabetes:          5.7%-6.4% Diabetes:              >6.4% Glycemic control for   <7.0% adults with diabetes     CBG: No results for input(s): "GLUCAP" in the last 168 hours.  Review of Systems:   Intubated/sedated  Past Medical History:  He,  has a past medical history of Arthritis, Asthma, Chronic cystitis, Chronic diastolic heart failure (HCC), Chronic kidney disease, Diabetes mellitus, Dysrhythmia, GERD (gastroesophageal reflux disease), H/O hiatal hernia, Hyperlipidemia, Hypertension, Lymphedema, Morbid obesity (HCC), NEPHROLITHIASIS, HX OF (12/02/2009), PVD (peripheral vascular disease) (HCC), Renal insufficiency, UMBILICAL HERNIA (02/08/2007), and Venous insufficiency.   Surgical History:   Past Surgical History:  Procedure Laterality Date   A/V FISTULAGRAM N/A 02/02/2024   Procedure: A/V Fistulagram;  Surgeon: Baron Border, MD;  Location: [facility] INVASIVE CV LAB;  Service: Cardiovascular;  Laterality:  N/A;   ABDOMINAL AORTOGRAM W/LOWER EXTREMITY N/A 05/19/2017   Procedure: Abdominal Aortogram w/Lower Extremity;  Surgeon: Richrd Char, MD;  Location: [facility] INVASIVE CV LAB;  Service: Cardiovascular;  Laterality: N/A;   ABDOMINAL AORTOGRAM W/LOWER EXTREMITY N/A 06/18/2021   Procedure: ABDOMINAL AORTOGRAM W/LOWER EXTREMITY;  Surgeon: Richrd Char, MD;  Location: [facility] INVASIVE CV LAB;  Service: Cardiovascular;  Laterality: N/A;   ABDOMINAL AORTOGRAM W/LOWER EXTREMITY N/A 04/17/2023   Procedure: ABDOMINAL AORTOGRAM W/LOWER EXTREMITY;  Surgeon: Adine Hoof, MD;  Location: [facility] INVASIVE CV LAB;  Service: Cardiovascular;  Laterality: N/A;   ABDOMINAL AORTOGRAM W/LOWER EXTREMITY N/A 05/22/2023   Procedure: ABDOMINAL AORTOGRAM W/LOWER EXTREMITY;  Surgeon: Adine Hoof, MD;  Location: [facility] INVASIVE CV LAB;  Service: Cardiovascular;  Laterality: N/A;   AMPUTATION     Right and left fifth toes.    AMPUTATION Left 06/18/2021   Procedure: Amputation transmetatarsal of toes 2 3 and 4 left foot ;  Surgeon: Richrd Char, MD;  Location: [facility] OR;  Service: Vascular;  Laterality: Left;   AMPUTATION TOE     emoval of both little toes   AV FISTULA PLACEMENT Left 03/21/2014   Procedure: ARTERIOVENOUS (AV) FISTULA CREATION with ultrasound;  Surgeon: Mayo Speck, MD;  Location: [facility] OR;  Service: Vascular;  Laterality: Left;   COLONOSCOPY     EYE SURGERY Bilateral    cataract and lens implant   FEMORAL-TIBIAL BYPASS GRAFT Left 06/18/2021   Procedure: Left below-knee popliteal to posterior tibial artery bypass with 9 reversed left greater saphenous vein;  Surgeon: Richrd Char, MD;  Location: [facility] OR;  Service: Vascular;  Laterality: Left;   HERNIA REPAIR     IR IMAGE GUIDED FLUID DRAIN BY CATHETER  04/09/2024   IR THORACENTESIS ASP PLEURAL SPACE W/IMG GUIDE  04/05/2024   LIGATION OF COMPETING BRANCHES OF ARTERIOVENOUS FISTULA Left 06/29/2015   Procedure: LIGATION OF LEFT ARM RADIOCEPHALIC  ARTERIOVENOUS FISTULA SIDE BRANCHES;  Surgeon: Arvil Lauber, MD;  Location: [facility] OR;  Service: Vascular;  Laterality: Left;   MULTIPLE EXTRACTIONS WITH ALVEOLOPLASTY N/A 04/07/2017   Procedure: Extraction of tooth #'s 1-11, 13, 14,16, 20-23, and 26-28 with alveoloplasty;  Surgeon: Carol Chroman, DDS;  Location: [facility] OR;  Service: Oral Surgery;  Laterality: N/A;   PERIPHERAL VASCULAR INTERVENTION Right 04/17/2023   Procedure: PERIPHERAL VASCULAR INTERVENTION;  Surgeon: Adine Hoof, MD;  Location: [facility] INVASIVE CV LAB;  Service: Cardiovascular;  Laterality: Right;  Right SFA Stent   PERIPHERAL VASCULAR INTERVENTION Left 05/22/2023   Procedure: PERIPHERAL VASCULAR INTERVENTION;  Surgeon: Adine Hoof, MD;  Location: [facility] INVASIVE CV LAB;  Service: Cardiovascular;  Laterality: Left;  SFA   Popliteal to posterior tibial bypass     2006   R knee arthoscopic repair of meniscus     UMBILICAL HERNIA REPAIR       Social History:   reports that he quit smoking about 44 years ago. His smoking use included cigarettes. He has quit using smokeless tobacco. He reports that he does not drink alcohol and does not use drugs.   Family History:  His family history includes Diabetes in his brother and mother; Heart disease in his mother.   Allergies Allergies  Allergen Reactions   Shellfish Allergy Swelling   Shrimp (Diagnostic) Swelling     Home Medications  Prior to Admission medications   Medication Sig Start Date End Date Taking? Authorizing Provider  acetaminophen  (TYLENOL ) 325 MG tablet Take 650 mg by mouth every 6 (six) hours as needed for mild pain (pain score 1-3) or fever.   Yes [provider]  apixaban  (ELIQUIS ) 5 MG TABS tablet Take 1 tablet (5 mg total) by mouth 2 (two) times daily. 06/27/21  Yes Montey Apa, DO  aspirin  81 MG chewable tablet Chew 81 mg by mouth daily.   Yes [provider]  atorvastatin  (LIPITOR ) 80 MG tablet Take 80 mg by mouth at  bedtime.   Yes [provider]  cinacalcet  (SENSIPAR ) 30 MG tablet Take 1 tablet (30 mg total) by mouth every Tuesday, Thursday, and Saturday at 6 PM. 04/18/24  Yes [name], MD  docusate sodium  (COLACE) 100 MG capsule Take 100 mg by mouth daily.   Yes [provider]  Glucagon, rDNA, (GLUCAGON EMERGENCY) 1 MG KIT Inject 1 mg into the skin as needed (hypoglycemia). 04/18/24  Yes [provider]  hydrALAZINE  (APRESOLINE ) 25 MG tablet Take 1 tablet (25 mg total) by mouth 3 (three) times daily. 04/18/24  Yes [name], MD  levothyroxine  (SYNTHROID ) 50 MCG tablet Take 50 mcg by mouth daily before breakfast.   Yes [provider]  lisinopril  (ZESTRIL ) 40 MG tablet Take 40 mg by mouth at bedtime.   Yes [provider]  multivitamin (RENA-VIT) TABS tablet Take 1 tablet by mouth daily.   Yes [provider]  sucroferric oxyhydroxide (VELPHORO ) 500 MG chewable tablet Chew 1,000 mg by mouth 3 (three) times daily with meals.   Yes [provider]  metoprolol  succinate (TOPROL -XL) 25 MG 24 hr tablet Take 1 tablet (25 mg total) by mouth daily. 06/28/21   Montey Apa, DO     Critical care time: 43 mins independent of procedures

## 2024-05-01 NOTE — ED Triage Notes (Signed)
 Pt here from [facility] with c/o unresponsive/ will respond to pain   cbg 133 , lsn 0600 missed dialysis yesterday ,

## 2024-05-01 NOTE — Progress Notes (Signed)
 PCCM INTERVAL PROGRESS NOTE   Called to bedside for patient with no IV access Had a right forearm IV with NE infusing at 1 mcg/min infiltrate.  No IVs.  20 gauge PIV placed in the right left EJ for urgent IV access. Flushes well and good blood return.  Phentolamine to the area of infiltrate.  Will place CVL.     [name], AGACNP-BC [facility]  See Amion for personal pager PCCM on call pager [phone] until 7pm. Please call Elink 7p-7a. [phone]  05/01/2024 9:46 PM

## 2024-05-02 ENCOUNTER — Encounter (HOSPITAL_COMMUNITY): Payer: Self-pay | Admitting: Internal Medicine

## 2024-05-02 DIAGNOSIS — R680 Hypothermia, not associated with low environmental temperature: Secondary | ICD-10-CM | POA: Diagnosis not present

## 2024-05-02 DIAGNOSIS — I959 Hypotension, unspecified: Secondary | ICD-10-CM

## 2024-05-02 DIAGNOSIS — J9602 Acute respiratory failure with hypercapnia: Secondary | ICD-10-CM | POA: Diagnosis not present

## 2024-05-02 DIAGNOSIS — G9341 Metabolic encephalopathy: Secondary | ICD-10-CM | POA: Diagnosis not present

## 2024-05-02 LAB — GLUCOSE, CAPILLARY
Glucose-Capillary: 143 mg/dL — ABNORMAL HIGH (ref 70–99)
Glucose-Capillary: 152 mg/dL — ABNORMAL HIGH (ref 70–99)
Glucose-Capillary: 192 mg/dL — ABNORMAL HIGH (ref 70–99)
Glucose-Capillary: 49 mg/dL — ABNORMAL LOW (ref 70–99)
Glucose-Capillary: 54 mg/dL — ABNORMAL LOW (ref 70–99)
Glucose-Capillary: 57 mg/dL — ABNORMAL LOW (ref 70–99)
Glucose-Capillary: 71 mg/dL (ref 70–99)
Glucose-Capillary: 77 mg/dL (ref 70–99)
Glucose-Capillary: 84 mg/dL (ref 70–99)
Glucose-Capillary: 88 mg/dL (ref 70–99)
Glucose-Capillary: 94 mg/dL (ref 70–99)

## 2024-05-02 LAB — RENAL FUNCTION PANEL
Albumin: 2.4 g/dL — ABNORMAL LOW (ref 3.5–5.0)
Anion gap: 12 (ref 5–15)
BUN: 43 mg/dL — ABNORMAL HIGH (ref 8–23)
CO2: 27 mmol/L (ref 22–32)
Calcium: 8 mg/dL — ABNORMAL LOW (ref 8.9–10.3)
Chloride: 100 mmol/L (ref 98–111)
Creatinine, Ser: 11.33 mg/dL — ABNORMAL HIGH (ref 0.61–1.24)
GFR, Estimated: 4 mL/min — ABNORMAL LOW (ref >60)
Glucose, Bld: 137 mg/dL — ABNORMAL HIGH (ref 70–99)
Phosphorus: 5.2 mg/dL — ABNORMAL HIGH (ref 2.5–4.6)
Potassium: 3.4 mmol/L — ABNORMAL LOW (ref 3.5–5.1)
Sodium: 139 mmol/L (ref 135–145)

## 2024-05-02 LAB — CBC
HCT: 22.2 % — ABNORMAL LOW (ref 39.0–52.0)
Hemoglobin: 7.2 g/dL — ABNORMAL LOW (ref 13.0–17.0)
MCH: 30.3 pg (ref 26.0–34.0)
MCHC: 32.4 g/dL (ref 30.0–36.0)
MCV: 93.3 fL (ref 80.0–100.0)
Platelets: 139 10*3/uL — ABNORMAL LOW (ref 150–400)
RBC: 2.38 MIL/uL — ABNORMAL LOW (ref 4.22–5.81)
RDW: 16.1 % — ABNORMAL HIGH (ref 11.5–15.5)
WBC: 6.3 10*3/uL (ref 4.0–10.5)
nRBC: 0 % (ref 0.0–0.2)

## 2024-05-02 LAB — HEPATIC FUNCTION PANEL
ALT: 10 U/L (ref 0–44)
AST: 16 U/L (ref 15–41)
Albumin: 2.5 g/dL — ABNORMAL LOW (ref 3.5–5.0)
Alkaline Phosphatase: 41 U/L (ref 38–126)
Bilirubin, Direct: <0.1 mg/dL (ref 0.0–0.2)
Total Bilirubin: 0.6 mg/dL (ref 0.0–1.2)
Total Protein: 5.5 g/dL — ABNORMAL LOW (ref 6.5–8.1)

## 2024-05-02 LAB — HEPATITIS B SURFACE ANTIBODY, QUANTITATIVE: Hep B S AB Quant (Post): 187 m[IU]/mL

## 2024-05-02 MED ORDER — DEXTROSE 50 % IV SOLN
25.0000 g | INTRAVENOUS | Status: AC
Start: 2024-05-02 — End: 2024-05-02
  Administered 2024-05-02: 25 g via INTRAVENOUS

## 2024-05-02 MED ORDER — DEXTROSE 50 % IV SOLN
25.0000 g | INTRAVENOUS | Status: AC
  Administered 2024-05-02: 25 g via INTRAVENOUS

## 2024-05-02 MED ORDER — DEXTROSE 50 % IV SOLN
INTRAVENOUS | Status: AC
  Filled 2024-05-02: qty 50

## 2024-05-02 MED ORDER — ORAL CARE MOUTH RINSE: 15.0000 mL | OROMUCOSAL | Status: DC | PRN

## 2024-05-02 MED ORDER — LEVOTHYROXINE SODIUM 50 MCG PO TABS
50.0000 ug | ORAL_TABLET | Freq: Every day | ORAL | Status: DC
Start: 2024-05-03 — End: 2024-05-03

## 2024-05-02 MED ORDER — SODIUM CHLORIDE 0.9 % IV SOLN
2.0000 g | INTRAVENOUS | Status: DC
  Administered 2024-05-02 – 2024-05-05 (×4): 2 g via INTRAVENOUS
  Filled 2024-05-02 (×4): qty 20

## 2024-05-02 MED ORDER — VANCOMYCIN HCL 750 MG/150ML IV SOLN
750.0000 mg | Freq: Once | INTRAVENOUS | Status: DC
  Filled 2024-05-02: qty 150

## 2024-05-02 MED ORDER — DEXTROSE 10 % IV SOLN: INTRAVENOUS | Status: DC

## 2024-05-02 MED ORDER — ORAL CARE MOUTH RINSE
15.0000 mL | OROMUCOSAL | Status: DC
  Administered 2024-05-02: 15 mL via OROMUCOSAL

## 2024-05-02 NOTE — Procedures (Signed)
 Extubation Procedure Note  Patient Details:   Name: [name] DOB: 1951/06/28 MRN: [id number]   Airway Documentation:    Vent end date: 05/02/24 Vent end time: 1202   Evaluation  O2 sats: stable throughout Complications: No apparent complications Patient did tolerate procedure well. Bilateral Breath Sounds: Rhonchi   Yes  Pt extubated per order to 4L [place]. Pt had a positive cuff leak, able to state name, good cough and no stridor noted.   [name] 05/02/2024, 12:02 PM

## 2024-05-02 NOTE — Progress Notes (Signed)
   05/02/24 1045  Vitals  Temp (!) 97.3 F (36.3 C)  Pulse Rate 61  Resp 20  BP (!) 131/57  SpO2 100 %  O2 Device Ventilator  Weight 66.5 kg  Type of Weight Post-Dialysis  Oxygen Therapy  FiO2 (%) 40 %  Patient Activity (if Appropriate) In bed  Pulse Oximetry Type Continuous  Oximetry Probe Site Changed No  Post Treatment  Dialyzer Clearance Clear  Hemodialysis Intake (mL) 0 mL  Liters Processed 73.5  Fluid Removed (mL) 1500 mL  Tolerated HD Treatment Yes   Informed consent signed and in chart.   TX duration: Three hours and thirty minutes  Patient tolerated well.  Hand-off given to patient's nurse.   Access used: Right lower arm fistula Access issues: None  Total UF removed: 1.5L Medication(s) given: None

## 2024-05-02 NOTE — Plan of Care (Signed)
  Problem: Education: Goal: Knowledge of General Education information will improve Description: Including pain rating scale, medication(s)/side effects and non-pharmacologic comfort measures Outcome: Progressing   Problem: Clinical Measurements: Goal: Ability to maintain clinical measurements within normal limits will improve Outcome: Progressing   Problem: Nutrition: Goal: Adequate nutrition will be maintained Outcome: Progressing   Problem: Pain Managment: Goal: General experience of comfort will improve and/or be controlled Outcome: Progressing

## 2024-05-02 NOTE — Progress Notes (Signed)
 [facility] Progress Note  Name: [name] MRN: [id number] DOB: Feb 01, 1951  Chief Complaint:  Altered mental status   Subjective:  HD was ordered on 5/21 however not done. HD RN has arrived to get him started soon.  Spoke with HD RN and adjusted to 3K bath and UF goal 2 kg.  He is on levo at 2 mcg/min and is on fentanyl .  He had 40 mL uop over 5/21.   Review of systems:  Unable to obtain secondary to intubated   ------------------------ Background on consult:  [name] is a 73 y.o. male with a history of end-stage renal disease (HD TTS at [place]), hypertension, diabetes mellitus, afib, right pleural effusion, and chronic diastolic heart failure who presented to the hospital from his SNF with altered mental status.  Per report, he missed dialysis yesterday.  CT head was negative for acute process.  Ammonia 41.  Pulmonary is concerned re: CO2 retention.  He was intubated and admitted to the ICU.  Nephrology is consulted for assistance with management of ESRD.  He was recently admitted here with a right pleural effusion requiring thoracentesis and chest tube placement.  Per charting from last admission he was frequently refusing and/or signing off to work 2-1/2 hours early while admitted last and early May.  His dry weight had been lowered per charting.  Blood pressure was labile following intubation however pressures have been 140's most recently per nursing.  I spoke with his significant other [name] via phone.  She states that the facility called her to let her know he was going to the hospital but hadn't spoken with anyone after that.  Discussed that he is intubated in the ICU and provided update.  She appreciated the call.      Intake/Output Summary (Last 24 hours) at 05/02/2024 0543 Last data filed at 05/02/2024 0000 Gross per 24 hour  Intake 456.46 ml  Output 40 ml  Net 416.46 ml    Vitals:  Vitals:   05/02/24 0000 05/02/24 0015 05/02/24 0030 05/02/24  0346  BP: 124/63     Pulse: (!) 54 61 60 (!) 53  Resp: 20 11 20    Temp: 98.8 F (37.1 C) 99.1 F (37.3 C) 99.1 F (37.3 C)   TempSrc: Bladder     SpO2: 100% 100% 99%   Weight:         Physical Exam:  General:  adult male intubated  HEENT: NCAT Eyes: EOMI sclera anicteric Neck: supple trachea midline  Heart: S1S2 no rub Lungs:  coarse mechanical breath sounds  Abdomen: soft/nt/nd Extremities: 1+ edema lower extremities  Skin: no rash on extremities exposed  Neuro: sedation is currently running Access LUE AVF in place; being cannulated    Medications reviewed   Labs:     Latest Ref Rng & Units 05/02/2024    4:21 AM 05/01/2024    9:24 AM 05/01/2024    8:01 AM  BMP  Glucose 70 - 99 mg/dL 253   664   BUN 8 - 23 mg/dL 43   38   Creatinine 4.03 - 1.24 mg/dL 47.42   59.56   Sodium 135 - 145 mmol/L 139  137  138   Potassium 3.5 - 5.1 mmol/L 3.4  3.2  3.7   Chloride 98 - 111 mmol/L 100   96   CO2 22 - 32 mmol/L 27   29   Calcium  8.9 - 10.3 mg/dL 8.0   8.5    Outpatient HD  orders:  [place] TTS BF 450 DF auto 1.5 2K/ 2 Ca AVF 4 hours EDW 73 kg  Last post weight of 72.6 kg on 04/27/24   Meds: mircera 75 mcg every 2 weeks (last outpatient mircera on 02/20/24).  venofer  50 mg weekly; hectorol  6 mcg three times a week; sensipar  30 mg three times a week with HD   Assessment/Plan:    # Metabolic encephalopathy  - felt 2/2 co2 retention per pulm - intubated  - he has also missed last HD treatment which is compounding    # ESRD - HD today (treatment was rolled over to today) - Continue HD per TTS schedule  - note hx of noncompliance    # Acute hypercapneic respiratory failure  - on mechanical ventilation per pulm - note hx of right pleural effusion as well  - optimize volume with HD    # HTN - hx of such - labile after intubation and sedation and now on levo at 2 mcg/min  - optimize volume with HD    # Anemia of CKD  - ESA was held on discharge given high  index of suspicion for malignancy regarding his pleural effusion.  - PRBC's per primary team    # Metabolic bone disease  - resume binders when taking PO - resume hectorol .  Resume sensipar  next after trending calcium   Disposition - in ICU intubated   Nan Aver, MD 05/02/2024 5:57 AM

## 2024-05-02 NOTE — Plan of Care (Signed)
  Problem: Fluid Volume: Goal: Ability to maintain a balanced intake and output will improve Outcome: Progressing   Problem: Clinical Measurements: Goal: Respiratory complications will improve Outcome: Progressing Goal: Cardiovascular complication will be avoided Outcome: Progressing   Problem: Elimination: Goal: Will not experience complications related to bowel motility Outcome: Progressing Goal: Will not experience complications related to urinary retention Outcome: Progressing   Problem: Pain Managment: Goal: General experience of comfort will improve and/or be controlled Outcome: Progressing   Problem: Education: Goal: Ability to describe self-care measures that may prevent or decrease complications (Diabetes Survival Skills Education) will improve Outcome: Not Progressing Goal: Individualized Educational Video(s) Outcome: Not Progressing   Problem: Coping: Goal: Ability to adjust to condition or change in health will improve Outcome: Not Progressing   Problem: Health Behavior/Discharge Planning: Goal: Ability to identify and utilize available resources and services will improve Outcome: Not Progressing Goal: Ability to manage health-related needs will improve Outcome: Not Progressing   Problem: Metabolic: Goal: Ability to maintain appropriate glucose levels will improve Outcome: Not Progressing   Problem: Nutritional: Goal: Maintenance of adequate nutrition will improve Outcome: Not Progressing Goal: Progress toward achieving an optimal weight will improve Outcome: Not Progressing   Problem: Skin Integrity: Goal: Risk for impaired skin integrity will decrease Outcome: Not Progressing   Problem: Tissue Perfusion: Goal: Adequacy of tissue perfusion will improve Outcome: Not Progressing   Problem: Education: Goal: Knowledge of General Education information will improve Description: Including pain rating scale, medication(s)/side effects and non-pharmacologic  comfort measures Outcome: Not Progressing   Problem: Health Behavior/Discharge Planning: Goal: Ability to manage health-related needs will improve Outcome: Not Progressing   Problem: Clinical Measurements: Goal: Ability to maintain clinical measurements within normal limits will improve Outcome: Not Progressing Goal: Will remain free from infection Outcome: Not Progressing Goal: Diagnostic test results will improve Outcome: Not Progressing   Problem: Activity: Goal: Risk for activity intolerance will decrease Outcome: Not Progressing   Problem: Nutrition: Goal: Adequate nutrition will be maintained Outcome: Not Progressing   Problem: Coping: Goal: Level of anxiety will decrease Outcome: Not Progressing   Problem: Safety: Goal: Ability to remain free from injury will improve Outcome: Not Progressing   Problem: Skin Integrity: Goal: Risk for impaired skin integrity will decrease Outcome: Not Progressing

## 2024-05-02 NOTE — Progress Notes (Signed)
 NAME:  [name], MRN:  [id number], DOB:  1951/11/04, LOS: 1 ADMISSION DATE:  05/01/2024, CONSULTATION DATE:  05/01/24 REFERRING MD:  Aldean Amass - EDP, CHIEF COMPLAINT: AMS  History of Present Illness:  73 year old man who presented to [facility] ED 5/21 for AMS. PMHx significant for HTN, HLD, AF (on Eliquis ), PVD, chronic R pleural effusion/fibrothorax (well known to our service for complex pleural space), ESRD on HD TTS, T2DM, GERD  Patient is encephalopathic; therefore, history has been obtained from chart review. Patient presented to ED from SNF with AMS/unresponsiveness; reportedly missed HD on the day PTA. VBG demonstrated CO2 retention. Required intubation for airway protection. CT Head NAICA, ammonia mildly up, CXR stable, hypothermic.  Pertinent Medical History:   Past Medical History:  Diagnosis Date   Arthritis    Asthma    as a child   Chronic cystitis    Chronic diastolic heart failure (HCC)    Chronic kidney disease    HD pt, 3 times a week.   Diabetes mellitus    Dysrhythmia    GERD (gastroesophageal reflux disease)    H/O hiatal hernia    states it's been fixed   Hyperlipidemia    Hypertension    Lymphedema    Morbid obesity (HCC)    NEPHROLITHIASIS, HX OF 12/02/2009   Qualifier: Diagnosis of  By: [name], Cat     Pleural effusion on right    Chronic R effusion/fibrothorax/possible malignancy, too chronically sick to do VATS   PVD (peripheral vascular disease) (HCC)    Renal insufficiency    UMBILICAL HERNIA 02/08/2007   Qualifier: History of  By: Peggy Bowens MD, Shane     Venous insufficiency    Significant Hospital Events: Including procedures, antibiotic start and stop dates in addition to other pertinent events   5/21 - Presented to [facility] ED from SNF for AMS. Intubated for hypercapnia, airway protection.  Interim History / Subjective:  No significant events overnight Sedation off, weaning on vent/following commands HD at bedside in ICU Plan for extubation  today  Objective:   Blood pressure (!) 142/58, pulse (!) 56, temperature (!) 97.5 F (36.4 C), resp. rate 17, weight 66.5 kg, SpO2 100%.    Vent Mode: CPAP;PSV FiO2 (%):  [40 %] 40 % Set Rate:  [20 bmp] 20 bmp Vt Set:  [500 mL] 500 mL PEEP:  [5 cmH20] 5 cmH20 Pressure Support:  [8 cmH20-15 cmH20] 8 cmH20 Plateau Pressure:  [25 cmH20-33 cmH20] 33 cmH20   Intake/Output Summary (Last 24 hours) at 05/02/2024 1303 Last data filed at 05/02/2024 1100 Gross per 24 hour  Intake 533.26 ml  Output 1555 ml  Net -1021.74 ml   Filed Weights   05/02/24 0500 05/02/24 0645 05/02/24 1045  Weight: 68 kg 68 kg 66.5 kg   Physical Examination: General: Chronically ill-appearing elderly man in NAD. HEENT: [place]/AT, anicteric sclera, PERRL 3mm, moist mucous membranes. Neuro: Awake, unable to assess orientation due to ETT but nodding appropriately to questions. Responds to verbal stimuli. Following commands consistently. Moves all 4 extremities spontaneously.+Cough and +Gag  CV: RRR, no m/g/r. PULM: Breathing even and unlabored on vent (PSV 8/5). Lung fields diminished at bases. GI: Soft, nontender, nondistended. Normoactive bowel sounds. Extremities: Bilateral chronic-appearing 1+ LE edema noted. Bilateral skin tears to lower legs R > L with dressings in place. Bilateral toe amputations (L 2nd-5th digits, R 5th digit) Skin: Warm/dry, no rashes. Wounds as above.  Resolved problem List:  N/A  Assessment and Plan  ?Sepsis with  septic shock versus sedation-related hypotension AMS, hypothermic, reasonable to give abx, hold fluids given stable BP and ESRD/volume overload. - Goal MAP > 65 - Unable to aggressively fluid resuscitate, given ESRD/volume overload - Levophed titrated to goal MAP - Trend WBC, fever curve - F/u Cx data - Continue broad-spectrum antibiotics (Vanc/Zosyn ), narrow as able   Acute hypercapneic respiratory failure Chronic R pleural effusion/fibrothorax Due to AMS; improved with  mechanical ventilation, ongoing issues with lung compliance are related to subsequent listed issues. Regarding chronic R pleural effusion, per Pulmonary note 5/6 "another chest tube is not warranted given lack of symptoms at present and chronicity of effusion unlikely to expand lung; only option if gets symptomatic would be high risk tube + pleural lytics + eventual pleurX since entrapped lung - not a surgical candidate or even a good pleuroscopy candidate; given baseline frailty.  - Continue full vent support (4-8cc/kg IBW), wean to extubate - Wean FiO2 for O2 sat > 90% - Daily WUA/SBT; plan for extubation today 5/22 - VAP bundle - Pulmonary hygiene - PAD protocol for sedation: Fentanyl  for goal RASS 0 to -1 - Monitor R pleural space clinically/with imaging, recommend conservative management  Acute metabolic/?septic encephalopathy- from CO2 retention, did miss HD, hypopneic on admit.  No sedating meds at home.  Ammonia minimally elevated. TSH 6.917. EEG 5/21 c/w moderate diffuse encephalopathy, no seizures - Correct metabolic derangements, HD today 5/22 at bedside - If mental status remains poor, MRI Brain may be warranted - Trial of lactulose in the setting of mildly elevated ammonia  ESRD on HD TTS, missed last session Volume overload - Nephro consulted for HD resumption, appreciate recommendations/assistance - HD per Nephro (TTS) - Trend BMP - Replete electrolytes as indicated - Monitor I&Os - Avoid nephrotoxic agents as able - Ensure adequate renal perfusion  Hx Afib History of chronic diastolic HF  ?Stress cardiomyopathy CHA2DS2-VASc Score 3 (age x 2, HTN). Echo 5/21 with septal hypokinesis, EF 55-60%, LV +RWMAs, normal RV function. EKG not concerning for ischemia, trops flat. - Cardiac monitoring - Optimize electrolytes for K > 4, Mg > 2 - Eliquis  for [facility]  Hypothyroidism TSH 6.917. - Continue home levothyroxine , may need dose adjustment  At risk for hypoglycemia - CBGs  Q4H - Goal CBG 140-180  Skin tears BLE - Local wound care - Low threshold WOC consult if worsening  Best Practice (right click and "Reselect all SmartList Selections" daily)   Diet/type: NPO DVT prophylaxis DOAC GI prophylaxis: N/A Lines: N/A Foley:  Yes, and it is still needed Code Status:  full code Last date of multidisciplinary goals of care discussion [will update]  Critical care time:    The patient is critically ill with multiple organ system failure and requires high complexity decision making for assessment and support, frequent evaluation and titration of therapies, advanced monitoring, review of radiographic studies and interpretation of complex data.   Critical Care Time devoted to patient care services, exclusive of separately billable procedures, described in this note is 37 minutes.  Star East, PA-C [facility] 05/02/24 1:03 PM  Please see [email or web address] for pager details.  From 7A-7P if no response, please call [phone] After hours, please call ELink [phone]

## 2024-05-03 DIAGNOSIS — J9602 Acute respiratory failure with hypercapnia: Secondary | ICD-10-CM | POA: Diagnosis not present

## 2024-05-03 DIAGNOSIS — G9341 Metabolic encephalopathy: Secondary | ICD-10-CM | POA: Diagnosis not present

## 2024-05-03 DIAGNOSIS — R680 Hypothermia, not associated with low environmental temperature: Secondary | ICD-10-CM | POA: Diagnosis not present

## 2024-05-03 DIAGNOSIS — I959 Hypotension, unspecified: Secondary | ICD-10-CM | POA: Diagnosis not present

## 2024-05-03 LAB — RENAL FUNCTION PANEL
Albumin: 2.3 g/dL — ABNORMAL LOW (ref 3.5–5.0)
Anion gap: 11 (ref 5–15)
BUN: 23 mg/dL (ref 8–23)
CO2: 30 mmol/L (ref 22–32)
Calcium: 8.2 mg/dL — ABNORMAL LOW (ref 8.9–10.3)
Chloride: 94 mmol/L — ABNORMAL LOW (ref 98–111)
Creatinine, Ser: 7.3 mg/dL — ABNORMAL HIGH (ref 0.61–1.24)
GFR, Estimated: 7 mL/min — ABNORMAL LOW (ref >60)
Glucose, Bld: 100 mg/dL — ABNORMAL HIGH (ref 70–99)
Phosphorus: 6.1 mg/dL — ABNORMAL HIGH (ref 2.5–4.6)
Potassium: 3.3 mmol/L — ABNORMAL LOW (ref 3.5–5.1)
Sodium: 135 mmol/L (ref 135–145)

## 2024-05-03 LAB — GLUCOSE, CAPILLARY
Glucose-Capillary: 97 mg/dL (ref 70–99)
Glucose-Capillary: 104 mg/dL — ABNORMAL HIGH (ref 70–99)
Glucose-Capillary: 110 mg/dL — ABNORMAL HIGH (ref 70–99)
Glucose-Capillary: 120 mg/dL — ABNORMAL HIGH (ref 70–99)
Glucose-Capillary: 125 mg/dL — ABNORMAL HIGH (ref 70–99)
Glucose-Capillary: 110 mg/dL — ABNORMAL HIGH (ref 70–99)

## 2024-05-03 LAB — CBC
HCT: 25.1 % — ABNORMAL LOW (ref 39.0–52.0)
Hemoglobin: 7.9 g/dL — ABNORMAL LOW (ref 13.0–17.0)
MCH: 30.2 pg (ref 26.0–34.0)
MCHC: 31.5 g/dL (ref 30.0–36.0)
MCV: 95.8 fL (ref 80.0–100.0)
Platelets: 135 10*3/uL — ABNORMAL LOW (ref 150–400)
RBC: 2.62 MIL/uL — ABNORMAL LOW (ref 4.22–5.81)
RDW: 16.1 % — ABNORMAL HIGH (ref 11.5–15.5)
WBC: 8.1 10*3/uL (ref 4.0–10.5)
nRBC: 0 % (ref 0.0–0.2)

## 2024-05-03 LAB — URINE CULTURE: Culture: >=100000 — AB

## 2024-05-03 LAB — MAGNESIUM: Magnesium: 1.8 mg/dL (ref 1.7–2.4)

## 2024-05-03 MED ORDER — LACTULOSE 10 GM/15ML PO SOLN
30.0000 g | Freq: Two times a day (BID) | ORAL | Status: DC
  Administered 2024-05-03 – 2024-05-04 (×4): 30 g via ORAL
  Filled 2024-05-03 (×6): qty 45

## 2024-05-03 MED ORDER — ACETAMINOPHEN 325 MG PO TABS: 650.0000 mg | ORAL_TABLET | Freq: Four times a day (QID) | ORAL | Status: DC | PRN

## 2024-05-03 MED ORDER — ATORVASTATIN CALCIUM 80 MG PO TABS
80.0000 mg | ORAL_TABLET | Freq: Every day | ORAL | Status: DC
  Administered 2024-05-07: 80 mg via ORAL
  Filled 2024-05-03 (×5): qty 1

## 2024-05-03 MED ORDER — CHLORHEXIDINE GLUCONATE CLOTH 2 % EX PADS
6.0000 | MEDICATED_PAD | Freq: Every day | CUTANEOUS | Status: DC
Start: 2024-05-04 — End: 2024-05-07
  Administered 2024-05-04 – 2024-05-06 (×3): 6 via TOPICAL

## 2024-05-03 MED ORDER — POTASSIUM CHLORIDE CRYS ER 10 MEQ PO TBCR
10.0000 meq | EXTENDED_RELEASE_TABLET | Freq: Once | ORAL | Status: AC
  Administered 2024-05-03: 10 meq via ORAL
  Filled 2024-05-03: qty 1

## 2024-05-03 MED ORDER — POLYETHYLENE GLYCOL 3350 17 G PO PACK: 17.0000 g | PACK | Freq: Every day | ORAL | Status: DC | PRN

## 2024-05-03 MED ORDER — DOCUSATE SODIUM 100 MG PO CAPS: 100.0000 mg | ORAL_CAPSULE | Freq: Two times a day (BID) | ORAL | Status: DC | PRN

## 2024-05-03 MED ORDER — RENA-VITE PO TABS
1.0000 | ORAL_TABLET | Freq: Every day | ORAL | Status: DC
  Administered 2024-05-03 – 2024-05-07 (×5): 1 via ORAL
  Filled 2024-05-03 (×5): qty 1

## 2024-05-03 MED ORDER — POLYETHYLENE GLYCOL 3350 17 G PO PACK
17.0000 g | PACK | Freq: Every day | ORAL | Status: DC
  Filled 2024-05-03: qty 1

## 2024-05-03 MED ORDER — NEPRO/CARBSTEADY PO LIQD
237.0000 mL | Freq: Two times a day (BID) | ORAL | Status: DC
Start: 2024-05-03 — End: 2024-05-07

## 2024-05-03 MED ORDER — APIXABAN 5 MG PO TABS
5.0000 mg | ORAL_TABLET | Freq: Two times a day (BID) | ORAL | Status: DC
  Administered 2024-05-03 – 2024-05-07 (×9): 5 mg via ORAL
  Filled 2024-05-03 (×9): qty 1

## 2024-05-03 MED ORDER — LEVOTHYROXINE SODIUM 50 MCG PO TABS
50.0000 ug | ORAL_TABLET | Freq: Every day | ORAL | Status: DC
  Administered 2024-05-03 – 2024-05-07 (×5): 50 ug via ORAL
  Filled 2024-05-03 (×5): qty 1

## 2024-05-03 MED ORDER — DOCUSATE SODIUM 100 MG PO CAPS
100.0000 mg | ORAL_CAPSULE | Freq: Every day | ORAL | Status: DC
  Filled 2024-05-03: qty 1

## 2024-05-03 MED ORDER — MIDODRINE HCL 5 MG PO TABS
10.0000 mg | ORAL_TABLET | Freq: Three times a day (TID) | ORAL | Status: DC
  Administered 2024-05-03 – 2024-05-07 (×13): 10 mg via ORAL
  Filled 2024-05-03 (×11): qty 2

## 2024-05-03 MED ORDER — ASPIRIN 81 MG PO CHEW
81.0000 mg | CHEWABLE_TABLET | Freq: Every day | ORAL | Status: DC
  Administered 2024-05-03 – 2024-05-07 (×5): 81 mg via ORAL
  Filled 2024-05-03 (×5): qty 1

## 2024-05-03 NOTE — Progress Notes (Signed)
 [facility] Progress Note  Name: [name] MRN: [id number] DOB: June 24, 1951  Chief Complaint:  Altered mental status   Subjective:  Last HD on 5/22 with 1.5 kg UF.  (UF was reduced for hypotension).  He was extubated on 5/22 to nasal cannula.  Team is treating for a UTI.  He has been disoriented to situation per nursing.  He had one blood sugar near 50 and so has been on D10.  Patient states he can't remember why he came to the hospital.  He has been on 1 liters oxygen most recently.  He has been on levophed at 2 mcg/min.  Review of systems:   Denies shortness of breath or chest pain No n/v    ------------------------ Background on consult:  [name] is a 73 y.o. male with a history of end-stage renal disease (HD TTS at [place]), hypertension, diabetes mellitus, afib, right pleural effusion, and chronic diastolic heart failure who presented to the hospital from his SNF with altered mental status.  Per report, he missed dialysis yesterday.  CT head was negative for acute process.  Ammonia 41.  Pulmonary is concerned re: CO2 retention.  He was intubated and admitted to the ICU.  Nephrology is consulted for assistance with management of ESRD.  He was recently admitted here with a right pleural effusion requiring thoracentesis and chest tube placement.  Per charting from last admission he was frequently refusing and/or signing off to work 2-1/2 hours early while admitted last and early May.  His dry weight had been lowered per charting.  Blood pressure was labile following intubation however pressures have been 140's most recently per nursing.  I spoke with his significant other [name] via phone.  She states that the facility called her to let her know he was going to the hospital but hadn't spoken with anyone after that.  Discussed that he is intubated in the ICU and provided update.  She appreciated the call.      Intake/Output Summary (Last 24 hours) at 05/03/2024  0601 Last data filed at 05/02/2024 2100 Gross per 24 hour  Intake 176.47 ml  Output 1505 ml  Net -1328.53 ml    Vitals:  Vitals:   05/03/24 0445 05/03/24 0500 05/03/24 0512 05/03/24 0537  BP:  (!) 116/59    Pulse: (!) 55 (!) 56    Resp: 12 17    Temp: (!) 96.8 F (36 C) (!) 96.6 F (35.9 C) 97.8 F (36.6 C)   TempSrc:   Oral   SpO2: 100% 100%    Weight:    70.8 kg     Physical Exam:   General:  adult male in bed in NAD HEENT: NCAT Eyes: EOMI sclera anicteric Neck: supple trachea midline  Heart: S1S2 no rub Lungs:  reduced breath sounds; normal work of breathing at rest on room air Abdomen: soft/nt/nd Extremities: no edema appreciated; decreased skin turgor  Psych no anxiety or agitation  Neuro: alert and oriented to person and location but not to year or situation; states is 2550 and 2525 Access LUE AVF with bruit and thrill    Medications reviewed   Labs:     Latest Ref Rng & Units 05/03/2024    5:14 AM 05/02/2024    4:21 AM 05/01/2024    9:24 AM  BMP  Glucose 70 - 99 mg/dL 829  562    BUN 8 - 23 mg/dL 23  43    Creatinine 1.30 - 1.24 mg/dL 8.65  11.33    Sodium 135 - 145 mmol/L 135  139  137   Potassium 3.5 - 5.1 mmol/L 3.3  3.4  3.2   Chloride 98 - 111 mmol/L 94  100    CO2 22 - 32 mmol/L 30  27    Calcium  8.9 - 10.3 mg/dL 8.2  8.0     Outpatient HD orders:  [place] TTS BF 450 DF auto 1.5 2K/ 2 Ca AVF 4 hours EDW 73 kg  Last post weight of 72.6 kg on 04/27/24   Meds: mircera 75 mcg every 2 weeks (last outpatient mircera on 02/20/24).  venofer  50 mg weekly; hectorol  6 mcg three times a week; sensipar  30 mg three times a week with HD   Assessment/Plan:    # ESRD - HD per TTS schedule  - note hx of noncompliance  - When blood sugar is acceptable would stop D10 per primary team discretion  # Metabolic encephalopathy  - felt 2/2 co2 retention per pulm - he was intubated on arrival.  Extubated on 5/22 - he had also missed last HD treatment which  compounded     # Acute hypercapneic respiratory failure  - extubated on 5/22 - note hx of right pleural effusion as well  - optimize volume with HD    # HTN - hx of such - labile after intubation then on low dose levo with sedation.  Now off sedation and is still on levo at 2 mcg/min   - optimize volume with HD    # Anemia of CKD  - ESA was held on last discharge given high index of suspicion for malignancy regarding his pleural effusion.  - PRBC's per primary team    # Metabolic bone disease  - resume binders when taking PO   - on hectorol .  Resume sensipar  next after trending calcium   Disposition - per primary team   Nan Aver, MD 05/03/2024 6:19 AM

## 2024-05-03 NOTE — Progress Notes (Signed)
 NAME:  [name], MRN:  [id number], DOB:  1951-03-10, LOS: 2 ADMISSION DATE:  05/01/2024, CONSULTATION DATE:  05/01/24 REFERRING MD:  Aldean Amass - EDP, CHIEF COMPLAINT: AMS  History of Present Illness:  73 year old man who presented to [facility] ED 5/21 for AMS. PMHx significant for HTN, HLD, AF (on Eliquis ), PVD, chronic R pleural effusion/fibrothorax (well known to our service for complex pleural space), ESRD on HD TTS, T2DM, GERD  Patient is encephalopathic; therefore, history has been obtained from chart review. Patient presented to ED from SNF with AMS/unresponsiveness; reportedly missed HD on the day PTA. VBG demonstrated CO2 retention. Required intubation for airway protection. CT Head NAICA, ammonia mildly up, CXR stable, hypothermic.  Pertinent Medical History:   Past Medical History:  Diagnosis Date   Arthritis    Asthma    as a child   Chronic cystitis    Chronic diastolic heart failure (HCC)    Chronic kidney disease    HD pt, 3 times a week.   Diabetes mellitus    Dysrhythmia    GERD (gastroesophageal reflux disease)    H/O hiatal hernia    states it's been fixed   Hyperlipidemia    Hypertension    Lymphedema    Morbid obesity (HCC)    NEPHROLITHIASIS, HX OF 12/02/2009   Qualifier: Diagnosis of  By: [name], Cat     Pleural effusion on right    Chronic R effusion/fibrothorax/possible malignancy, too chronically sick to do VATS   PVD (peripheral vascular disease) (HCC)    Renal insufficiency    UMBILICAL HERNIA 02/08/2007   Qualifier: History of  By: Peggy Bowens MD, Shane     Venous insufficiency    Significant Hospital Events: Including procedures, antibiotic start and stop dates in addition to other pertinent events   5/21 - Presented to [facility] ED from SNF for AMS. Intubated for hypercapnia, airway protection.  Interim History / Subjective:  Patient was successfully extubated yesterday, currently on nasal cannula oxygen especially during sleep Received hemodialysis  yesterday, tolerated well Off pressors    Objective:   Blood pressure (!) 133/58, pulse (!) 52, temperature (!) 96.6 F (35.9 C), resp. rate 16, weight 70.8 kg, SpO2 99%.    Vent Mode: CPAP;PSV FiO2 (%):  [40 %] 40 % PEEP:  [5 cmH20] 5 cmH20 Pressure Support:  [8 cmH20] 8 cmH20   Intake/Output Summary (Last 24 hours) at 05/03/2024 0846 Last data filed at 05/03/2024 0700 Gross per 24 hour  Intake 479.69 ml  Output 1505 ml  Net -1025.31 ml   Filed Weights   05/02/24 0645 05/02/24 1045 05/03/24 0537  Weight: 68 kg 66.5 kg 70.8 kg   Physical Examination: General: Chronically ill-appearing male, lying on the bed HEENT: [place]/AT, eyes anicteric.  moist mucus membranes Neuro: AT, opens eyes with vocal stimuli, following simple commands  Chest: Coarse breath sounds, no wheezes or rhonchi Heart: Regular rate and rhythm, no murmurs or gallops Abdomen: Soft, nontender, nondistended, bowel sounds present Skin: Refer to nursing notes  Labs and images reviewed  Resolved problem List:  N/A  Assessment and Plan  Sepsis with septic shock in the setting of urinary tract infection with E. coli Acute metabolic encephalopathy in the setting of hypercapnia Acute respiratory failure with hypoxia and hypercapnia End-stage renal disease on hemodialysis Paroxysmal A-fib Chronic right-sided pleural effusion/fibrothorax Hypothyroidism Recurrent hypoglycemia  Shock has resolved, patient is off vasopressors Urine culture is growing E. coli,  Continue IV ceftriaxone  to complete 5 days therapy Mental  status is significantly improved Patient was successfully extubated yesterday, currently on nasal cannula oxygen titrate with O2 sat goal 92% Received hemodialysis yesterday, tolerated well Start midodrine 10 mg 3 times daily Sepsis resolved Remain in sinus rhythm Continue apixaban  for stroke prophylaxis Outpatient pulmonary follow-up Continue levothyroxine  On D10 infusion, titrate with  fingerstick goal of 140-180  Best Practice (right click and "Reselect all SmartList Selections" daily)   Diet/type: Regular consistency DVT prophylaxis DOAC GI prophylaxis:  Lines: N/A Foley: Discontinue Code Status:  full code Last date of multidisciplinary goals of care discussion: 5/23 patient was updated at bedside, decision was to continue full scope of care     Trevor Fudge, MD [facility] See Amion for pager If no response to pager, please call [phone] until 7pm After 7pm, Please call E-link [phone]

## 2024-05-03 NOTE — Progress Notes (Signed)
 IMTS will assume care at 7AM on 05/04/2024 with Dr. Bevelyn Bryant as attending.  Patrick Dallas, DO Internal Medicine Resident, PGY-2 Please contact the on call pager at [phone] for any urgent or emergent needs. 11:56 AM 05/03/2024

## 2024-05-03 NOTE — Plan of Care (Signed)
 Problem: Skin Integrity: Goal: Risk for impaired skin integrity will decrease 05/03/2024 0715 by Seleta Dakins, Maybelle Spatz, RN Outcome: Progressing 05/03/2024 0715 by Seleta Dakins, Maybelle Spatz, RN Outcome: Progressing   Problem: Coping: Goal: Level of anxiety will decrease 05/03/2024 0715 by Seleta Dakins, Maybelle Spatz, RN Outcome: Progressing 05/03/2024 0715 by Seleta Dakins, Maybelle Spatz, RN Outcome: Progressing   Problem: Elimination: Goal: Will not experience complications related to bowel motility 05/03/2024 0715 by Seleta Dakins, Maybelle Spatz, RN Outcome: Progressing 05/03/2024 0715 by Seleta Dakins, Maybelle Spatz, RN Outcome: Progressing   Problem: Education: Goal: Ability to describe self-care measures that may prevent or decrease complications (Diabetes Survival Skills Education) will improve 05/03/2024 0715 by Helayne Lo, RN Outcome: Not Progressing 05/03/2024 0715 by Seleta Dakins, Maybelle Spatz, RN Outcome: Not Progressing Goal: Individualized Educational Video(s) 05/03/2024 0715 by Seleta Dakins, Maybelle Spatz, RN Outcome: Not Progressing 05/03/2024 0715 by Seleta Dakins, Maybelle Spatz, RN Outcome: Not Progressing   Problem: Coping: Goal: Ability to adjust to condition or change in health will improve 05/03/2024 0715 by Seleta Dakins, Maybelle Spatz, RN Outcome: Not Progressing 05/03/2024 0715 by Seleta Dakins, Maybelle Spatz, RN Outcome: Progressing   Problem: Fluid Volume: Goal: Ability to maintain a balanced intake and output will improve 05/03/2024 0715 by Helayne Lo, RN Outcome: Not Progressing 05/03/2024 0715 by Seleta Dakins, Maybelle Spatz, RN Outcome: Progressing   Problem: Health Behavior/Discharge Planning: Goal: Ability to identify and utilize available resources and services will improve 05/03/2024 0715 by Seleta Dakins, Maybelle Spatz, RN Outcome: Not Progressing 05/03/2024 0715 by Seleta Dakins, Maybelle Spatz, RN Outcome: Progressing Goal: Ability to manage health-related needs will improve 05/03/2024 0715 by Helayne Lo,  RN Outcome: Not Progressing 05/03/2024 0715 by Seleta Dakins, Maybelle Spatz, RN Outcome: Not Progressing   Problem: Metabolic: Goal: Ability to maintain appropriate glucose levels will improve 05/03/2024 0715 by Helayne Lo, RN Outcome: Not Progressing 05/03/2024 0715 by Seleta Dakins, Maybelle Spatz, RN Outcome: Progressing   Problem: Nutritional: Goal: Maintenance of adequate nutrition will improve 05/03/2024 0715 by Helayne Lo, RN Outcome: Not Progressing 05/03/2024 0715 by Seleta Dakins, Maybelle Spatz, RN Outcome: Progressing Goal: Progress toward achieving an optimal weight will improve 05/03/2024 0715 by Helayne Lo, RN Outcome: Not Progressing 05/03/2024 0715 by Seleta Dakins, Maybelle Spatz, RN Outcome: Progressing   Problem: Tissue Perfusion: Goal: Adequacy of tissue perfusion will improve 05/03/2024 0715 by Helayne Lo, RN Outcome: Not Progressing 05/03/2024 0715 by Seleta Dakins, Maybelle Spatz, RN Outcome: Progressing   Problem: Education: Goal: Knowledge of General Education information will improve Description: Including pain rating scale, medication(s)/side effects and non-pharmacologic comfort measures 05/03/2024 0715 by Helayne Lo, RN Outcome: Not Progressing 05/03/2024 0715 by Seleta Dakins, Maybelle Spatz, RN Outcome: Progressing   Problem: Health Behavior/Discharge Planning: Goal: Ability to manage health-related needs will improve 05/03/2024 0715 by Helayne Lo, RN Outcome: Not Progressing 05/03/2024 0715 by Seleta Dakins, Maybelle Spatz, RN Outcome: Progressing   Problem: Clinical Measurements: Goal: Ability to maintain clinical measurements within normal limits will improve 05/03/2024 0715 by Helayne Lo, RN Outcome: Not Progressing 05/03/2024 0715 by Seleta Dakins, Maybelle Spatz, RN Outcome: Progressing Goal: Will remain free from infection 05/03/2024 0715 by Helayne Lo, RN Outcome: Not Progressing 05/03/2024 0715 by Seleta Dakins, Maybelle Spatz,  RN Outcome: Progressing Goal: Diagnostic test results will improve 05/03/2024 0715 by Helayne Lo, RN Outcome: Not Progressing 05/03/2024 0715 by Seleta Dakins, Maybelle Spatz, RN Outcome: Progressing Goal: Respiratory complications will improve 05/03/2024 0715 by Helayne Lo, RN Outcome: Not Progressing 05/03/2024 0715 by Seleta Dakins, Maybelle Spatz, RN Outcome: Progressing Goal: Cardiovascular complication will  be avoided 05/03/2024 0715 by Helayne Lo, RN Outcome: Not Progressing 05/03/2024 0715 by Seleta Dakins, Maybelle Spatz, RN Outcome: Progressing   Problem: Activity: Goal: Risk for activity intolerance will decrease 05/03/2024 0715 by Seleta Dakins, Maybelle Spatz, RN Outcome: Not Progressing 05/03/2024 0715 by Seleta Dakins, Maybelle Spatz, RN Outcome: Progressing   Problem: Nutrition: Goal: Adequate nutrition will be maintained 05/03/2024 0715 by Helayne Lo, RN Outcome: Not Progressing 05/03/2024 0715 by Seleta Dakins, Maybelle Spatz, RN Outcome: Progressing   Problem: Elimination: Goal: Will not experience complications related to urinary retention 05/03/2024 0715 by Helayne Lo, RN Outcome: Not Progressing 05/03/2024 0715 by Seleta Dakins, Maybelle Spatz, RN Outcome: Progressing   Problem: Pain Managment: Goal: General experience of comfort will improve and/or be controlled 05/03/2024 0715 by Helayne Lo, RN Outcome: Not Progressing 05/03/2024 0715 by Seleta Dakins, Maybelle Spatz, RN Outcome: Progressing   Problem: Safety: Goal: Ability to remain free from injury will improve 05/03/2024 0715 by Helayne Lo, RN Outcome: Not Progressing 05/03/2024 0715 by Seleta Dakins, Maybelle Spatz, RN Outcome: Progressing   Problem: Skin Integrity: Goal: Risk for impaired skin integrity will decrease 05/03/2024 0715 by Helayne Lo, RN Outcome: Not Progressing 05/03/2024 0715 by Seleta Dakins, Maybelle Spatz, RN Outcome: Progressing

## 2024-05-03 NOTE — Evaluation (Signed)
 Occupational Therapy Evaluation Patient Details Name: [name] MRN: [id number] DOB: 08/09/1951 Today's Date: 05/03/2024   History of Present Illness   73 yo male admitted 05/01/24 from SNF with AMS, volume overload, requiring intubation 5/21-5/22. PMhx: ESRD on HD TTS, CHF, DM, HTN, HLD, AFib, complex pleural space     Clinical Impressions Prior to this admission, patient living at [facility] for the last 3 years. Patient uses a cane for ambulation, a wheelchair to navigate into dailysis, and gets assist for his lower body dressing and bathing. Patient able to complete lap around ICU with one seated rest break, and is back to his baseline as far as ADL management is concerned. Patient would benefit from RW to promote increased balance. OT will sign off as patient does not have any further acute OT needs, and recommending return to [facility] when medically stable.   VSS BP in sitting: 107/59 (71)    If plan is discharge home, recommend the following:   A little help with walking and/or transfers;A little help with bathing/dressing/bathroom;Direct supervision/assist for medications management;Direct supervision/assist for financial management;Assist for transportation;Help with stairs or ramp for entrance;Supervision due to cognitive status     Functional Status Assessment   Patient has had a recent decline in their functional status and demonstrates the ability to make significant improvements in function in a reasonable and predictable amount of time.     Equipment Recommendations   None recommended by OT     Recommendations for Other Services         Precautions/Restrictions   Precautions Precautions: Fall Recall of Precautions/Restrictions: Impaired     Mobility Bed Mobility Overal bed mobility: Needs Assistance Bed Mobility: Supine to Sit     Supine to sit: Contact guard     General bed mobility comments: increased time with cues to scoot to  EOB    Transfers Overall transfer level: Needs assistance Equipment used: Rolling walker (2 wheels) Transfers: Sit to/from Stand Sit to Stand: Supervision           General transfer comment: cues for hand placement and to fully control descent to chair and recliner      Balance Overall balance assessment: Needs assistance Sitting-balance support: No upper extremity supported, Feet supported Sitting balance-Leahy Scale: Good     Standing balance support: Bilateral upper extremity supported Standing balance-Leahy Scale: Poor Standing balance comment: Rw in standing                           ADL either performed or assessed with clinical judgement   ADL Overall ADL's : At baseline                                       General ADL Comments: Prior to this admission, patient living at [facility] for the last 3 years. Patient uses a cane for ambulation, a wheelchair to navigate into dailysis, and gets assist for his lower body dressing and bathing. Patient able to complete lap around ICU with one seated rest break, and is back to his baseline as far as ADL management is concerned. Patient would benefit from RW to promote increased balance. OT will sign off as patient does not have any further acute OT needs, and recommending return to [facility] when medically stable.     Vision Baseline Vision/History: 0 No visual deficits  Ability to See in Adequate Light: 0 Adequate Patient Visual Report: No change from baseline Vision Assessment?: No apparent visual deficits     Perception Perception: Within Functional Limits       Praxis Praxis: WFL       Pertinent Vitals/Pain Pain Assessment Pain Assessment: No/denies pain     Extremity/Trunk Assessment Upper Extremity Assessment Upper Extremity Assessment: Generalized weakness;Right hand dominant (Edema in LUE)   Lower Extremity Assessment Lower Extremity Assessment: Defer to PT evaluation    Cervical / Trunk Assessment Cervical / Trunk Assessment: Normal   Communication Communication Communication: No apparent difficulties   Cognition Arousal: Alert Behavior During Therapy: WFL for tasks assessed/performed Cognition: Cognition impaired   Orientation impairments: Time, Situation Awareness: Intellectual awareness intact, Online awareness impaired Memory impairment (select all impairments): Short-term memory Attention impairment (select first level of impairment): Sustained attention Executive functioning impairment (select all impairments): Problem solving OT - Cognition Comments: Minimal STM deficits, occasional word finding difficulties, more than likely baseline, stating it was 2050 when asked                 Following commands: Intact       Cueing  General Comments   Cueing Techniques: Verbal cues  VSS on RA   Exercises     Shoulder Instructions      Home Living Family/patient expects to be discharged to:: Skilled nursing facility   Available Help at Discharge: Available 24 hours/day                         Home Equipment: Cane - single point;Wheelchair - manual   Additional Comments: Has been at [facility] for 3 years.      Prior Functioning/Environment Prior Level of Function : Independent/Modified Independent;History of Falls (last six months)             Mobility Comments: States he was independent with SPC short distances, uses W/c for longer distances at SNF and HD ADLs Comments: States he bathes/dresses himself.    OT Problem List: Decreased activity tolerance;Impaired balance (sitting and/or standing);Increased edema   OT Treatment/Interventions:        OT Goals(Current goals can be found in the care plan section)   Acute Rehab OT Goals Patient Stated Goal: to go somewhere else other than [facility] OT Goal Formulation: With patient Time For Goal Achievement: 05/17/24 Potential to Achieve Goals: Good    OT Frequency:       Co-evaluation   Reason for Co-Treatment: Complexity of the patient's impairments (multi-system involvement) PT goals addressed during session: Mobility/safety with mobility;Balance OT goals addressed during session: ADL's and self-care      AM-PAC OT "6 Clicks" Daily Activity     Outcome Measure Help from another person eating meals?: None Help from another person taking care of personal grooming?: None Help from another person toileting, which includes using toliet, bedpan, or urinal?: A Little Help from another person bathing (including washing, rinsing, drying)?: A Little Help from another person to put on and taking off regular upper body clothing?: A Little Help from another person to put on and taking off regular lower body clothing?: A Lot 6 Click Score: 19   End of Session Equipment Utilized During Treatment: Gait belt;Rolling walker (2 wheels) Nurse Communication: Mobility status  Activity Tolerance: Patient tolerated treatment well Patient left: in chair;with call bell/phone within reach;with chair alarm set  OT Visit Diagnosis: Unsteadiness on feet (R26.81);Muscle weakness (generalized) (M62.81)  Time: 2956-2130 OT Time Calculation (min): 25 min Charges:  OT General Charges $OT Visit: 1 Visit OT Evaluation $OT Eval Moderate Complexity: 1 Mod  [name], OTR/L Acute Rehabilitation Services [phone]   [name] 05/03/2024, 12:52 PM

## 2024-05-03 NOTE — Evaluation (Signed)
 Physical Therapy Evaluation/ Discharge Patient Details Name: [name] MRN: [id number] DOB: Jul 13, 1951 Today's Date: 05/03/2024  History of Present Illness  73 yo male admitted 05/01/24 from SNF with AMS, volume overload, requiring intubation 5/21-5/22. PMhx: ESRD on HD TTS, CHF, DM, HTN, HLD, AFib, complex pleural space  Clinical Impression  Pt pleasant stating he walks with cane, performs ADLs and does not like being at [facility]. Pt able to walk in hall with RW and encouraged use of RW rather than cane at home. He relies on [facility] for HD and does not travel to dining hall or engage in facility activities per pt. Pt with assist for donning socks, CGA for gait with RW and assist for transfers. Anticipate pt at baseline functional level with plan for return to facility and all further needs can be addressed there. Encouraged OOB and daily mobility with nursing staff. No further acute therapy needs, will sign off.       If plan is discharge home, recommend the following: A little help with walking and/or transfers;Assistance with cooking/housework;Assist for transportation;Direct supervision/assist for medications management;Supervision due to cognitive status   Can travel by private vehicle   Yes    Equipment Recommendations None recommended by PT  Recommendations for Other Services       Functional Status Assessment Patient has not had a recent decline in their functional status     Precautions / Restrictions Precautions Precautions: Fall Recall of Precautions/Restrictions: Impaired      Mobility  Bed Mobility Overal bed mobility: Needs Assistance Bed Mobility: Supine to Sit     Supine to sit: Contact guard     General bed mobility comments: increased time with cues to scoot to EOB    Transfers Overall transfer level: Needs assistance   Transfers: Sit to/from Stand Sit to Stand: Supervision           General transfer comment: cues for hand placement and to  fully control descent to chair and recliner    Ambulation/Gait Ambulation/Gait assistance: Contact guard assist Gait Distance (Feet): 75 Feet Assistive device: Rolling walker (2 wheels) Gait Pattern/deviations: Decreased stride length, Trunk flexed, Step-through pattern   Gait velocity interpretation: <1.8 ft/sec, indicate of risk for recurrent falls   General Gait Details: pt walked 75' x 2 trials with seated rest, reliance on RW with flexed trunk, slight veering Rt with cues for direction, obstacles and safety  Stairs            Wheelchair Mobility     Tilt Bed    Modified Rankin (Stroke Patients Only)       Balance Overall balance assessment: Needs assistance Sitting-balance support: No upper extremity supported, Feet supported Sitting balance-Leahy Scale: Good     Standing balance support: Bilateral upper extremity supported Standing balance-Leahy Scale: Poor Standing balance comment: Rw in standing                             Pertinent Vitals/Pain Pain Assessment Pain Assessment: No/denies pain    Home Living Family/patient expects to be discharged to:: Skilled nursing facility   Available Help at Discharge: Available 24 hours/day             Home Equipment: Cane - single point;Wheelchair - manual Additional Comments: Has been at [facility] for 3 years.    Prior Function Prior Level of Function : Independent/Modified Independent;History of Falls (last six months)  Mobility Comments: States he was independent with SPC short distances, uses W/c for longer distances at SNF and HD ADLs Comments: States he bathes/dresses himself.     Extremity/Trunk Assessment   Upper Extremity Assessment Upper Extremity Assessment: Defer to OT evaluation    Lower Extremity Assessment Lower Extremity Assessment: Generalized weakness    Cervical / Trunk Assessment Cervical / Trunk Assessment: Normal  Communication    Communication Communication: No apparent difficulties    Cognition Arousal: Alert Behavior During Therapy: WFL for tasks assessed/performed   PT - Cognitive impairments: Orientation, Problem solving   Orientation impairments: Time, Situation                   PT - Cognition Comments: oriented to place, stating year as 2050 no awareness of day or month Following commands: Intact       Cueing Cueing Techniques: Verbal cues     General Comments      Exercises     Assessment/Plan    PT Assessment All further PT needs can be met in the next venue of care  PT Problem List Decreased strength;Decreased activity tolerance;Decreased balance;Decreased mobility       PT Treatment Interventions      PT Goals (Current goals can be found in the Care Plan section)  Acute Rehab PT Goals PT Goal Formulation: All assessment and education complete, DC therapy    Frequency       Co-evaluation PT/OT/SLP Co-Evaluation/Treatment: Yes Reason for Co-Treatment: Complexity of the patient's impairments (multi-system involvement) (not required for future sessions) PT goals addressed during session: Mobility/safety with mobility;Balance         AM-PAC PT "6 Clicks" Mobility  Outcome Measure Help needed turning from your back to your side while in a flat bed without using bedrails?: None Help needed moving from lying on your back to sitting on the side of a flat bed without using bedrails?: A Little Help needed moving to and from a bed to a chair (including a wheelchair)?: A Little Help needed standing up from a chair using your arms (e.g., wheelchair or bedside chair)?: A Little Help needed to walk in hospital room?: A Little Help needed climbing 3-5 steps with a railing? : A Lot 6 Click Score: 18    End of Session Equipment Utilized During Treatment: Gait belt Activity Tolerance: Patient tolerated treatment well Patient left: in chair;with call bell/phone within reach;with  chair alarm set Nurse Communication: Mobility status PT Visit Diagnosis: Other abnormalities of gait and mobility (R26.89);Muscle weakness (generalized) (M62.81);History of falling (Z91.81)    Time: 1610-9604 PT Time Calculation (min) (ACUTE ONLY): 25 min   Charges:   PT Evaluation $PT Eval Moderate Complexity: 1 Mod   PT General Charges $$ ACUTE PT VISIT: 1 Visit         Annis Baseman, PT Acute Rehabilitation Services Office: [phone]   [name] 05/03/2024, 10:26 AM

## 2024-05-03 NOTE — Inpatient Diabetes Management (Signed)
 Inpatient Diabetes Program Recommendations  AACE/ADA: New Consensus Statement on Inpatient Glycemic Control (2015)  Target Ranges:  Prepandial:   less than 140 mg/dL      Peak postprandial:   less than 180 mg/dL (1-2 hours)      Critically ill patients:  140 - 180 mg/dL   Lab Results  Component Value Date   GLUCAP 104 (H) 05/03/2024   HGBA1C 4.5 (L) 05/01/2024    Review of Glycemic Control  Latest Reference Range & Units 05/02/24 14:56 05/02/24 19:30 05/02/24 23:24 05/02/24 23:32 05/02/24 23:55 05/03/24 03:17 05/03/24 07:35  Glucose-Capillary 70 - 99 mg/dL 84 161 (H) 57 (L) 54 (L) 143 (H) 97 104 (H)  (H): Data is abnormally high (L): Data is abnormally low Diabetes history: Type 2 DM Outpatient Diabetes medications: none Current orders for Inpatient glycemic control: Novolog  0-9 units Q4H  Inpatient Diabetes Program Recommendations:    Noted hypoglycemia of 57 mg/dL following 4 units of Novolog . If needed, Consider reducing correction to Novolog  0-6 units Q4H.   Thanks, Marjo Sievert, MSN, RNC-OB Diabetes Coordinator [phone] (8a-5p)

## 2024-05-03 NOTE — Progress Notes (Signed)
 Pt educated on the importance of taking Lipitor  medication but continued to refuse med. CCM informed

## 2024-05-04 ENCOUNTER — Encounter (HOSPITAL_COMMUNITY): Payer: Self-pay | Admitting: Internal Medicine

## 2024-05-04 DIAGNOSIS — A419 Sepsis, unspecified organism: Secondary | ICD-10-CM | POA: Diagnosis present

## 2024-05-04 DIAGNOSIS — G9341 Metabolic encephalopathy: Secondary | ICD-10-CM | POA: Diagnosis not present

## 2024-05-04 DIAGNOSIS — N186 End stage renal disease: Secondary | ICD-10-CM | POA: Diagnosis not present

## 2024-05-04 DIAGNOSIS — R001 Bradycardia, unspecified: Secondary | ICD-10-CM

## 2024-05-04 DIAGNOSIS — Z992 Dependence on renal dialysis: Secondary | ICD-10-CM | POA: Diagnosis not present

## 2024-05-04 LAB — CBC
HCT: 26.5 % — ABNORMAL LOW (ref 39.0–52.0)
Hemoglobin: 8.3 g/dL — ABNORMAL LOW (ref 13.0–17.0)
MCH: 29.5 pg (ref 26.0–34.0)
MCHC: 31.3 g/dL (ref 30.0–36.0)
MCV: 94.3 fL (ref 80.0–100.0)
Platelets: 129 10*3/uL — ABNORMAL LOW (ref 150–400)
RBC: 2.81 MIL/uL — ABNORMAL LOW (ref 4.22–5.81)
RDW: 15.9 % — ABNORMAL HIGH (ref 11.5–15.5)
WBC: 7.2 10*3/uL (ref 4.0–10.5)
nRBC: 0.3 % — ABNORMAL HIGH (ref 0.0–0.2)

## 2024-05-04 LAB — BLOOD GAS, VENOUS
Acid-Base Excess: 2.4 mmol/L — ABNORMAL HIGH (ref 0.0–2.0)
Acid-Base Excess: 1.9 mmol/L (ref 0.0–2.0)
Bicarbonate: 32.7 mmol/L — ABNORMAL HIGH (ref 20.0–28.0)
Bicarbonate: 31.8 mmol/L — ABNORMAL HIGH (ref 20.0–28.0)
Drawn by: 71468
O2 Saturation: 52.7 %
O2 Saturation: 61.8 %
Patient temperature: 36.6
Patient temperature: 36.6
pCO2, Ven: 78 mmHg (ref 44–60)
pCO2, Ven: 75 mmHg (ref 44–60)
pH, Ven: 7.23 — ABNORMAL LOW (ref 7.25–7.43)
pH, Ven: 7.24 — ABNORMAL LOW (ref 7.25–7.43)
pO2, Ven: 31 mmHg — CL (ref 32–45)
pO2, Ven: 32 mmHg (ref 32–45)

## 2024-05-04 LAB — RENAL FUNCTION PANEL
Albumin: 2.2 g/dL — ABNORMAL LOW (ref 3.5–5.0)
Anion gap: 12 (ref 5–15)
BUN: 26 mg/dL — ABNORMAL HIGH (ref 8–23)
CO2: 29 mmol/L (ref 22–32)
Calcium: 8.8 mg/dL — ABNORMAL LOW (ref 8.9–10.3)
Chloride: 94 mmol/L — ABNORMAL LOW (ref 98–111)
Creatinine, Ser: 8.93 mg/dL — ABNORMAL HIGH (ref 0.61–1.24)
GFR, Estimated: 6 mL/min — ABNORMAL LOW (ref >60)
Glucose, Bld: 116 mg/dL — ABNORMAL HIGH (ref 70–99)
Phosphorus: 7.6 mg/dL — ABNORMAL HIGH (ref 2.5–4.6)
Potassium: 3.6 mmol/L (ref 3.5–5.1)
Sodium: 135 mmol/L (ref 135–145)

## 2024-05-04 LAB — MAGNESIUM: Magnesium: 1.9 mg/dL (ref 1.7–2.4)

## 2024-05-04 LAB — GLUCOSE, CAPILLARY
Glucose-Capillary: 111 mg/dL — ABNORMAL HIGH (ref 70–99)
Glucose-Capillary: 115 mg/dL — ABNORMAL HIGH (ref 70–99)
Glucose-Capillary: 116 mg/dL — ABNORMAL HIGH (ref 70–99)
Glucose-Capillary: 131 mg/dL — ABNORMAL HIGH (ref 70–99)
Glucose-Capillary: 92 mg/dL (ref 70–99)
Glucose-Capillary: 93 mg/dL (ref 70–99)

## 2024-05-04 MED ORDER — MIDODRINE HCL 5 MG PO TABS
ORAL_TABLET | ORAL | Status: AC
  Filled 2024-05-04: qty 2

## 2024-05-04 MED ORDER — SUCROFERRIC OXYHYDROXIDE 500 MG PO CHEW
1000.0000 mg | CHEWABLE_TABLET | Freq: Three times a day (TID) | ORAL | Status: DC
  Administered 2024-05-04 (×2): 1000 mg via ORAL
  Filled 2024-05-04 (×12): qty 2

## 2024-05-04 NOTE — Plan of Care (Signed)

## 2024-05-04 NOTE — Progress Notes (Signed)
 Pt tolerated treatment well and goal met. Patient refused to taking Midodrine 10 mg at 5:00pm.  05/04/24 1809  Vitals  Temp 98 F (36.7 C)  BP (!) 114/50  BP Location Left Leg  BP Method Automatic  Patient Position (if appropriate) Lying  Pulse Rate (!) 53  Resp 19  Oxygen Therapy  SpO2 100 %  O2 Device Nasal Cannula  O2 Flow Rate (L/min) 2 L/min  Patient Activity (if Appropriate) In bed  Pulse Oximetry Type Continuous  Oximetry Probe Site Changed No  During Treatment Monitoring  Blood Flow Rate (mL/min) 399 mL/min  Arterial Pressure (mmHg) -164.84 mmHg  Venous Pressure (mmHg) 189.48 mmHg  TMP (mmHg) 8.48 mmHg  Ultrafiltration Rate (mL/min) 618 mL/min  Dialysate Flow Rate (mL/min) 299 ml/min  Duration of HD Treatment -hour(s) 2.98 hour(s)  Cumulative Fluid Removed (mL) per Treatment  990.65  HD Safety Checks Performed Yes  Intra-Hemodialysis Comments Tolerated well;Tx completed  Dialysis Fluid Bolus Normal Saline  Bolus Amount (mL) 300 mL  Post Treatment  Dialyzer Clearance Lightly streaked  Hemodialysis Intake (mL) 0 mL  Liters Processed 72  Fluid Removed (mL) 1000 mL  Tolerated HD Treatment Yes  Post-Hemodialysis Comments see notes.  AVG/AVF Arterial Site Held (minutes) 10 minutes  AVG/AVF Venous Site Held (minutes) 10 minutes  Fistula / Graft Left Forearm  No Placement Date or Time found.   Orientation: Left  Access Location: Forearm  Site Condition No complications  Fistula / Graft Assessment Present;Thrill;Bruit  Status Deaccessed  Needle Size 15  Drainage Description None

## 2024-05-04 NOTE — Plan of Care (Signed)

## 2024-05-04 NOTE — Progress Notes (Signed)
 HD#3 Subjective:   Summary: [name] is a 73 y.o. male with a pertinent PMH of A-fib on A/C, type 2 diabetes, hypertension, ESRD on hemodialysis who presented from SNF after becoming progressively more altered.  He required brief stay in the ICU for intubation and pressure support presumably in the setting of urosepsis before being extubated and transferred to the IMTS on 05/04/2024.  Overnight Events: Multiple bowel movements overnight likely in the setting of lactulose.  On rounds this morning, patient is oriented x 4, he is however very somnolent.  He falls asleep intermittently throughout interview and requires relatively vigorous stimulation to awaken.  He denies any pain or shortness of breath.  Objective:  Vital signs in last 24 hours: Vitals:   05/04/24 0809 05/04/24 0900 05/04/24 1000 05/04/24 1100  BP:  (!) 131/48 (!) 143/58 (!) 118/43  Pulse: (!) 49 (!) 51 (!) 43 (!) 43  Resp: 11 (!) 6 15 (!) 9  Temp: (!) 96.3 F (35.7 C)     TempSrc: Axillary     SpO2: 100% 100% 100% 100%  Weight:        Physical Exam:  Constitutional: Somnolent, but not encephalopathic Cardiovascular: regular rate and rhythm Pulmonary/Chest: normal work of breathing on room air, lungs clear to auscultation bilaterally Abdominal: soft, non-tender, non-distended Neurological: alert & oriented Skin: warm and dry  Filed Weights   05/02/24 1045 05/03/24 0537 05/04/24 0423  Weight: 66.5 kg 70.8 kg 70.8 kg      Intake/Output Summary (Last 24 hours) at 05/04/2024 1211 Last data filed at 05/04/2024 0500 Gross per 24 hour  Intake 430.06 ml  Output 0 ml  Net 430.06 ml   Net IO Since Admission: -55.68 mL [05/04/24 1211]  Pertinent Labs:    Latest Ref Rng & Units 05/04/2024    5:55 AM 05/03/2024    5:14 AM 05/02/2024    4:21 AM  CBC  WBC 4.0 - 10.5 K/uL 7.2  8.1  6.3   Hemoglobin 13.0 - 17.0 g/dL 8.3  7.9  7.2   Hematocrit 39.0 - 52.0 % 26.5  25.1  22.2   Platelets 150 - 400 K/uL 129   135  139        Latest Ref Rng & Units 05/04/2024    6:01 AM 05/03/2024    5:14 AM 05/02/2024    4:21 AM  CMP  Glucose 70 - 99 mg/dL 161  096  045   BUN 8 - 23 mg/dL 26  23  43   Creatinine 0.61 - 1.24 mg/dL 4.09  8.11  91.47   Sodium 135 - 145 mmol/L 135  135  139   Potassium 3.5 - 5.1 mmol/L 3.6  3.3  3.4   Chloride 98 - 111 mmol/L 94  94  100   CO2 22 - 32 mmol/L 29  30  27    Calcium  8.9 - 10.3 mg/dL 8.8  8.2  8.0   Total Protein 6.5 - 8.1 g/dL   5.5   Total Bilirubin 0.0 - 1.2 mg/dL   0.6   Alkaline Phos 38 - 126 U/L   41   AST 15 - 41 U/L   16   ALT 0 - 44 U/L   10     Assessment/Plan:   Principal Problem:   Coma (HCC) Active Problems:   Poor venous access   Patient Summary: [name] is a 73 y.o. male with a pertinent PMH of A-fib on A/C, type 2  diabetes, hypertension, ESRD on hemodialysis who presented from SNF after becoming progressively more altered.  He required brief stay in the ICU for intubation and pressure support presumably in the setting of urosepsis before being extubated and transferred to the IMTS on 05/04/2024 on hospital day 3.   #Altered mental status #Sepsis with septic shock in the setting of E. coli UTI #Acute respiratory failure with hypoxia and hypercapnia Now off pressors and no longer intubated.  Continuing ceftriaxone  to complete a 5-day course per PCCM.  On nasal cannula saturating well.  Blood pressures on the softer side but no persistent hypotensive episodes.  Persistently bradycardic, telemetry reviewed.  He does have a history of persistent bradycardia and this does not appear new for him.  Was given lactulose in setting of increased ammonia.  Unclear clinical significance, does not have known cirrhosis but does have some signs such as low platelets as well as risk factors for cirrhosis. Plan: - EKG - Serial reassessment for worsening mental status - Midodrine 10 mg 3 times daily  #Chronic right pleural effusion # Lymphocytic  predominant, exudative pleural effusion Extensive workup last admission, in brief patient presenting with persistent right-sided pleural effusions.  This seems to be stable compared to prior admission.  Does have outpatient follow-up in place from prior admission. Plan:  - Patient scheduled to follow-up with pulmonology 6/5   # ESRD (Tue/Th/ Sat) # Normocytic anemia Lab work relatively reassuring this morning.  He is scheduled for dialysis today, will see if this helps his mental status. Plan: -Dialysis per nephrology, appreciate their help.   # A-fib on Eliquis  - Continued Eliquis  5 mg.  #Hypothyroidism - Continue levothyroxine  50 mcg   #Hyperlipidemia #PVD - Continue Lipitor  80 mg daily - ASA 81  Skin tears BLE - Local wound care   Diet: Renal IVF: None,None VTE: DOAC Code: Full  Dispo: Anticipated discharge to prior SNF in 2-3 days pending clinical improvement, stability.   [name] MD Internal Medicine Resident PGY-1 Pager: [phone] Please contact the on call pager after 5 pm and on weekends at [phone].

## 2024-05-04 NOTE — Progress Notes (Addendum)
 [facility] Progress Note  Name: [name] MRN: [id number] DOB: [birth year]  Chief Complaint:  Altered mental status   Subjective:  Last HD on 5/22 with 1.5 kg UF.  (UF was reduced for hypotension).  He has been more alert overnight and has been asking for food.  He has been on 3 liters oxygen.  He is off of levophed.  Sugars better.   Review of systems:     Denies shortness of breath or chest pain No n/v  No dizziness or cramping   ------------------------ Background on consult:  [name] is a 73 y.o. male with a history of end-stage renal disease (HD TTS at [place]), hypertension, diabetes mellitus, afib, right pleural effusion, and chronic diastolic heart failure who presented to the hospital from his SNF with altered mental status.  Per report, he missed dialysis yesterday.  CT head was negative for acute process.  Ammonia 41.  Pulmonary is concerned re: CO2 retention.  He was intubated and admitted to the ICU.  Nephrology is consulted for assistance with management of ESRD.  He was recently admitted here with a right pleural effusion requiring thoracentesis and chest tube placement.  Per charting from last admission he was frequently refusing and/or signing off to work 2-1/2 hours early while admitted last and early May.  His dry weight had been lowered per charting.  Blood pressure was labile following intubation however pressures have been 140's most recently per nursing.  I spoke with his significant other [name] via phone.  She states that the facility called her to let her know he was going to the hospital but hadn't spoken with anyone after that.  Discussed that he is intubated in the ICU and provided update.  She appreciated the call.      Intake/Output Summary (Last 24 hours) at 05/04/2024 0558 Last data filed at 05/04/2024 0500 Gross per 24 hour  Intake 506.2 ml  Output 0 ml  Net 506.2 ml    Vitals:  Vitals:   05/04/24 0445 05/04/24 0500  05/04/24 0515 05/04/24 0530  BP: 123/65 134/64 (!) 121/45 110/83  Pulse: (!) 48 (!) 55 (!) 49 (!) 57  Resp: 16 (!) 23 (!) 0 20  Temp:      TempSrc:      SpO2: 100% 100% 100% 99%  Weight:         Physical Exam:    General:  adult male in bed in NAD HEENT: NCAT Eyes: EOMI sclera anicteric Neck: supple trachea midline  Heart: S1S2 no rub Lungs:  reduced breath sounds right base; clearer left lung; normal work of breathing at rest on 3 liters oxygen  Abdomen: soft/nt/nd Extremities: no edema appreciated; decreased skin turgor  Psych no anxiety or agitation  Neuro: alert and oriented to person and location but not to year; states is 2050 and then follows up with 2025 Access LUE AVF with bruit and thrill    Medications reviewed   Labs:     Latest Ref Rng & Units 05/03/2024    5:14 AM 05/02/2024    4:21 AM 05/01/2024    9:24 AM  BMP  Glucose 70 - 99 mg/dL 409  811    BUN 8 - 23 mg/dL 23  43    Creatinine 9.14 - 1.24 mg/dL 7.82  95.62    Sodium 130 - 145 mmol/L 135  139  137   Potassium 3.5 - 5.1 mmol/L 3.3  3.4  3.2   Chloride 98 -  111 mmol/L 94  100    CO2 22 - 32 mmol/L 30  27    Calcium  8.9 - 10.3 mg/dL 8.2  8.0     Outpatient HD orders:  [place] [facility] BF 450 DF auto 1.5 2K/ 2 Ca AVF 4 hours EDW 73 kg  Last post weight of 72.6 kg on 04/27/24   Meds: mircera 75 mcg every 2 weeks (last outpatient mircera on 02/20/24).  venofer  50 mg weekly; hectorol  6 mcg three times a week; sensipar  30 mg three times a week with HD   Assessment/Plan:    # ESRD - HD per TTS schedule  - note hx of noncompliance   # Metabolic encephalopathy  - felt 2/2 co2 retention per pulm - he was intubated on arrival.  Extubated on 5/22 - he had also missed last HD treatment prior to admission which compounded     # Acute hypercapneic respiratory failure  - extubated on 5/22 - note hx of right pleural effusion as well  - optimize volume with HD  - chronic right sided pleural  effusion/fibrothorax per pulm   # HTN - hx of such - labile after intubation then on low dose levo  - off of levophed - optimize volume with HD  - holding home anti-hypertensives (if correct reported as lisinopril  and hydralazine )   # Anemia of CKD  - ESA was held on last discharge given high index of suspicion for malignancy regarding his pleural effusion.  - PRBC's per primary team    # Metabolic bone disease  - Resume velphoro  - on hectorol .  Resume sensipar  next after trending calcium   Disposition - per primary team    Nan Aver, MD 05/04/2024 6:16 AM

## 2024-05-05 LAB — BLOOD GAS, VENOUS
Acid-Base Excess: 1.6 mmol/L (ref 0.0–2.0)
Bicarbonate: 30.3 mmol/L — ABNORMAL HIGH (ref 20.0–28.0)
O2 Saturation: 91.5 %
Patient temperature: 37
pCO2, Ven: 66 mmHg — ABNORMAL HIGH (ref 44–60)
pH, Ven: 7.27 (ref 7.25–7.43)
pO2, Ven: 55 mmHg — ABNORMAL HIGH (ref 32–45)

## 2024-05-05 LAB — RENAL FUNCTION PANEL
Albumin: 2.5 g/dL — ABNORMAL LOW (ref 3.5–5.0)
Anion gap: 10 (ref 5–15)
BUN: 18 mg/dL (ref 8–23)
CO2: 26 mmol/L (ref 22–32)
Calcium: 8.5 mg/dL — ABNORMAL LOW (ref 8.9–10.3)
Chloride: 98 mmol/L (ref 98–111)
Creatinine, Ser: 6.34 mg/dL — ABNORMAL HIGH (ref 0.61–1.24)
GFR, Estimated: 9 mL/min — ABNORMAL LOW (ref >60)
Glucose, Bld: 117 mg/dL — ABNORMAL HIGH (ref 70–99)
Phosphorus: 5.8 mg/dL — ABNORMAL HIGH (ref 2.5–4.6)
Potassium: 4 mmol/L (ref 3.5–5.1)
Sodium: 134 mmol/L — ABNORMAL LOW (ref 135–145)

## 2024-05-05 LAB — CBC
HCT: 29.3 % — ABNORMAL LOW (ref 39.0–52.0)
Hemoglobin: 8.9 g/dL — ABNORMAL LOW (ref 13.0–17.0)
MCH: 29.7 pg (ref 26.0–34.0)
MCHC: 30.4 g/dL (ref 30.0–36.0)
MCV: 97.7 fL (ref 80.0–100.0)
Platelets: 116 10*3/uL — ABNORMAL LOW (ref 150–400)
RBC: 3 MIL/uL — ABNORMAL LOW (ref 4.22–5.81)
RDW: 16.3 % — ABNORMAL HIGH (ref 11.5–15.5)
WBC: 7.3 10*3/uL (ref 4.0–10.5)
nRBC: 0 % (ref 0.0–0.2)

## 2024-05-05 LAB — GLUCOSE, CAPILLARY
Glucose-Capillary: 71 mg/dL (ref 70–99)
Glucose-Capillary: 125 mg/dL — ABNORMAL HIGH (ref 70–99)
Glucose-Capillary: 88 mg/dL (ref 70–99)
Glucose-Capillary: 129 mg/dL — ABNORMAL HIGH (ref 70–99)
Glucose-Capillary: 80 mg/dL (ref 70–99)
Glucose-Capillary: 126 mg/dL — ABNORMAL HIGH (ref 70–99)

## 2024-05-05 LAB — MAGNESIUM: Magnesium: 1.8 mg/dL (ref 1.7–2.4)

## 2024-05-05 MED ORDER — CINACALCET HCL 30 MG PO TABS
30.0000 mg | ORAL_TABLET | ORAL | Status: DC
Start: 2024-05-07 — End: 2024-05-07
  Administered 2024-05-07: 30 mg via ORAL
  Filled 2024-05-05: qty 1

## 2024-05-05 MED ORDER — ORAL CARE MOUTH RINSE: 15.0000 mL | OROMUCOSAL | Status: DC | PRN

## 2024-05-05 NOTE — Plan of Care (Signed)
  Problem: Fluid Volume: Goal: Ability to maintain a balanced intake and output will improve Outcome: Progressing   Problem: Metabolic: Goal: Ability to maintain appropriate glucose levels will improve Outcome: Progressing   Problem: Nutritional: Goal: Maintenance of adequate nutrition will improve Outcome: Progressing Goal: Progress toward achieving an optimal weight will improve Outcome: Progressing   Problem: Skin Integrity: Goal: Risk for impaired skin integrity will decrease Outcome: Progressing   Problem: Tissue Perfusion: Goal: Adequacy of tissue perfusion will improve Outcome: Progressing   Problem: Clinical Measurements: Goal: Ability to maintain clinical measurements within normal limits will improve Outcome: Progressing Goal: Will remain free from infection Outcome: Progressing Goal: Diagnostic test results will improve Outcome: Progressing Goal: Respiratory complications will improve Outcome: Progressing Goal: Cardiovascular complication will be avoided Outcome: Progressing   Problem: Coping: Goal: Level of anxiety will decrease Outcome: Progressing   Problem: Skin Integrity: Goal: Risk for impaired skin integrity will decrease Outcome: Progressing   Problem: Pain Managment: Goal: General experience of comfort will improve and/or be controlled Outcome: Progressing   Problem: Elimination: Goal: Will not experience complications related to bowel motility Outcome: Progressing Goal: Will not experience complications related to urinary retention Outcome: Progressing

## 2024-05-05 NOTE — Progress Notes (Signed)
 [facility] Progress Note  Name: [name] MRN: [id number] DOB: 28-May-1951  Chief Complaint:  Altered mental status   Subjective:  Last HD on 5/24 with 1 kg UF.  He's been waiting on a floor bed.  BP has been lower per nurse.  (Not on levo and 110's systolic on my arrival.  He's been on BIPAP overnight (routine).  No acute issues  Review of systems:     Denies shortness of breath or chest pain No n/v  No dizziness or cramping   ------------------------ Background on consult:  [name] is a 73 y.o. male with a history of end-stage renal disease (HD TTS at [place]), hypertension, diabetes mellitus, afib, right pleural effusion, and chronic diastolic heart failure who presented to the hospital from his SNF with altered mental status.  Per report, he missed dialysis yesterday.  CT head was negative for acute process.  Ammonia 41.  Pulmonary is concerned re: CO2 retention.  He was intubated and admitted to the ICU.  Nephrology is consulted for assistance with management of ESRD.  He was recently admitted here with a right pleural effusion requiring thoracentesis and chest tube placement.  Per charting from last admission he was frequently refusing and/or signing off to work 2-1/2 hours early while admitted last and early May.  His dry weight had been lowered per charting.  Blood pressure was labile following intubation however pressures have been 140's most recently per nursing.  I spoke with his significant other [name] via phone.  She states that the facility called her to let her know he was going to the hospital but hadn't spoken with anyone after that.  Discussed that he is intubated in the ICU and provided update.  She appreciated the call.      Intake/Output Summary (Last 24 hours) at 05/05/2024 0555 Last data filed at 05/04/2024 2200 Gross per 24 hour  Intake 100 ml  Output 1000 ml  Net -900 ml    Vitals:  Vitals:   05/05/24 0400 05/05/24 0411 05/05/24  0500 05/05/24 0503  BP: (!) 116/48 (!) 116/48 (!) 101/42   Pulse: (!) 53 (!) 56 (!) 52 64  Resp: 18 17 15  (!) 24  Temp:      TempSrc:      SpO2: 100% 100% 100% 100%  Weight:   67.7 kg      Physical Exam:    General:  adult male in bed in NAD on BIPAP HEENT: NCAT Eyes: EOMI sclera anicteric Neck: supple trachea midline  Heart: S1S2 no rub Lungs:  reduced breath sounds right base; clearer left lung; normal work of breathing at rest on BIPAP Abdomen: soft/nt/nd Extremities: no edema appreciated; decreased skin turgor  Psych no anxiety or agitation  Neuro: alert and oriented to person and location and year Access LUE AVF with bruit and thrill    Medications reviewed   Labs:     Latest Ref Rng & Units 05/04/2024    6:01 AM 05/03/2024    5:14 AM 05/02/2024    4:21 AM  BMP  Glucose 70 - 99 mg/dL 409  811  914   BUN 8 - 23 mg/dL 26  23  43   Creatinine 0.61 - 1.24 mg/dL 7.82  9.56  21.30   Sodium 135 - 145 mmol/L 135  135  139   Potassium 3.5 - 5.1 mmol/L 3.6  3.3  3.4   Chloride 98 - 111 mmol/L 94  94  100  CO2 22 - 32 mmol/L 29  30  27    Calcium  8.9 - 10.3 mg/dL 8.8  8.2  8.0    Outpatient HD orders:  [place] [facility] BF 450 DF auto 1.5 2K/ 2 Ca AVF 4 hours EDW 73 kg  Last post weight of 72.6 kg on 04/27/24   Meds: mircera 75 mcg every 2 weeks (last outpatient mircera on 02/20/24).  venofer  50 mg weekly; hectorol  6 mcg three times a week; sensipar  30 mg three times a week with HD   Assessment/Plan:    # ESRD - HD per TTS schedule  - note hx of dialysis noncompliance  - await today's labs - note that he is below EDW if weights are accurate.  From current data, could try 68.5 kg - 69 (not his nadir weight as hypotension and prev pressor requirement).  Reassess per trends  # Metabolic encephalopathy  - felt 2/2 co2 retention per pulm - he was intubated on arrival.  Extubated on 5/22 - he had also missed last HD treatment prior to admission which compounded     #  Acute hypercapneic respiratory failure  - extubated on 5/22 - note hx of right pleural effusion as well  - optimize volume with HD  - chronic right sided pleural effusion/fibrothorax per pulm   # HTN - hx of such - labile after intubation then on low dose levo; now off of levophed - optimize volume with HD  - holding home anti-hypertensives (if correct is reported as being on lisinopril  and hydralazine  at home)   # Anemia of CKD  - ESA was held on last discharge given high index of suspicion for malignancy regarding his pleural effusion.  - PRBC's per primary team discretion    # Metabolic bone disease  - Resumed velphoro  - on hectorol .  Resume sensipar    Disposition - per primary team    Nan Aver, MD 05/05/2024 6:09 AM

## 2024-05-05 NOTE — Plan of Care (Signed)
  Problem: Coping: Goal: Ability to adjust to condition or change in health will improve Outcome: Progressing   Problem: Health Behavior/Discharge Planning: Goal: Ability to manage health-related needs will improve Outcome: Progressing   Problem: Metabolic: Goal: Ability to maintain appropriate glucose levels will improve Outcome: Progressing

## 2024-05-05 NOTE — Progress Notes (Addendum)
 HD#4 Subjective:   Summary: [name] is a 73 y.o. male with a pertinent PMH of A-fib on A/C, type 2 diabetes, hypertension, ESRD on hemodialysis who presented from SNF after becoming progressively more altered.  He required brief stay in the ICU for intubation and pressure support presumably in the setting of urosepsis before being extubated and transferred to the IMTS on 05/04/2024.  Overnight Events: After VBG yesterday afternoon with hypoxia and hypercarbia with acidemia he was placed on BiPAP with improvement in his oxygenation but still was hypercarbic and acidemic.  He remained on BiPAP until about 7:30 AM this morning.  On exam this morning he appears at baseline and has no new or worsening complaints. Objective:  Vital signs in last 24 hours: Vitals:   05/05/24 0400 05/05/24 0411 05/05/24 0500 05/05/24 0503  BP: (!) 116/48 (!) 116/48 (!) 101/42   Pulse: (!) 53 (!) 56 (!) 52 64  Resp: 18 17 15  (!) 24  Temp:      TempSrc:      SpO2: 100% 100% 100% 100%  Weight:   67.7 kg     Physical Exam:  Constitutional: Chronically ill-appearing male laying in bed Cardiovascular: regular rate and rhythm Pulmonary/Chest: normal work of breathing, decreased lung sounds in the bases bilaterally Neurological: alert & oriented Skin: warm and dry  Filed Weights   05/04/24 1449 05/04/24 1816 05/05/24 0500  Weight: 69.6 kg 68.6 kg 67.7 kg      Intake/Output Summary (Last 24 hours) at 05/05/2024 0625 Last data filed at 05/04/2024 2200 Gross per 24 hour  Intake 100 ml  Output 1000 ml  Net -900 ml   Net IO Since Admission: -955.68 mL [05/05/24 0625]  Pertinent Labs:    Latest Ref Rng & Units 05/05/2024    4:00 AM 05/04/2024    5:55 AM 05/03/2024    5:14 AM  CBC  WBC 4.0 - 10.5 K/uL 7.3  7.2  8.1   Hemoglobin 13.0 - 17.0 g/dL 8.9  8.3  7.9   Hematocrit 39.0 - 52.0 % 29.3  26.5  25.1   Platelets 150 - 400 K/uL 116  129  135        Latest Ref Rng & Units 05/05/2024    9:36  AM 05/04/2024    6:01 AM 05/03/2024    5:14 AM  CMP  Glucose 70 - 99 mg/dL 016  010  932   BUN 8 - 23 mg/dL 18  26  23    Creatinine 0.61 - 1.24 mg/dL 3.55  7.32  2.02   Sodium 135 - 145 mmol/L 134  135  135   Potassium 3.5 - 5.1 mmol/L 4.0  3.6  3.3   Chloride 98 - 111 mmol/L 98  94  94   CO2 22 - 32 mmol/L 26  29  30    Calcium  8.9 - 10.3 mg/dL 8.5  8.8  8.2     Assessment/Plan:   Principal Problem:   Metabolic encephalopathy Active Problems:   Poor venous access   ESRD on dialysis [facility])   Patient Summary: [name] is a 73 y.o. male with a pertinent PMH of A-fib on A/C, type 2 diabetes, hypertension, ESRD on hemodialysis who presented from SNF after becoming progressively more altered.  He required brief stay in the ICU for intubation and pressure support presumably in the setting of urosepsis before being extubated and transferred to the IMTS on 05/04/2024 on hospital day 4.  #Acute respiratory failure  with hypoxia and hypercapnia VBG yesterday before BiPAP showed hypoxia and hypercapnia with a pH of 7.23.  After placement on BiPAP his pCO2 improved but was still elevated but he remained acidemic with a pH of 7.24.  Remained on BiPAP for about 9 hours until 7:30 AM this morning.  He appears comfortable breathing on supplemental oxygen through the nasal cannula with good oxygen saturations.  He does have a chronic right-sided pleural effusion that has been stable on recent imaging.  If there is no improvement we will consider reimaging. - Repeat VBG about 2 hours after BiPAP removal - Remaining progressive, BiPAP as needed - Wean oxygen to saturation goal 92+  Remained hypercarbic on repeat VBG.  Discussed with pulmonology, Dr. Felipe Horton, who recommend noninvasive ventilation at home.Patient continues to exhibit signs of hypercapnea associated with chronic respiratory failure secondary to neuromuscular weakness. Patient requires the use of NIV both qHS and daytime to help with  exacerbation periods. The use of the NIV with treat the patients high pCO2 levels and can reduce risk of exacerbations and future hospitalizations whne used at night and during the day. Patient with need these advanced settings in conjunction with current medication regimen. BIPAP is not an option due to its functional limitations and severity of the patient's condition. Failure to have NIV available for use over a 24 hour period could lead to death. Patient is able to protect their airways and clear secretions on their own.    #Altered mental status #Sepsis with septic shock in the setting of E. coli UTI Back to baseline this morning after some confusion yesterday morning.  He was found to be hypercarbic as above and spent the night on BiPAP.  He will complete 5 days of ceftriaxone  today. - Day 5/5 of ceftriaxone  - Midodrine 10 mg 3 times daily  #Chronic right pleural effusion # Lymphocytic predominant, exudative pleural effusion Extensive workup last admission, in brief patient presenting with persistent right-sided pleural effusions. does have outpatient follow-up in place from prior admission. Plan:  - Patient scheduled to follow-up with pulmonology 6/5   # ESRD (Tue/Th/ Sat) # Normocytic anemia -Dialysis per nephrology, appreciate their help.   # A-fib on Eliquis  - Continued Eliquis  5 mg.  #Hypothyroidism - Continue levothyroxine  50 mcg   #Hyperlipidemia #PVD - Continue Lipitor  80 mg daily - ASA 81  Skin tears BLE - Local wound care   Diet: Renal IVF: None,None VTE: DOAC Code: Full  Dispo: Anticipated discharge to prior SNF in 2-3 days pending clinical improvement, stability.   Cleven Dallas, DO Internal Medicine Resident, PGY-2 Please contact the on call pager at [phone] for any urgent or emergent needs. 6:25 AM 05/05/2024

## 2024-05-05 NOTE — Progress Notes (Signed)
 Pt currently off bipap and on 3L [place]. Pt is in no distress at this time. Bipap on S/B @ bedside, RT will monitor as needed.

## 2024-05-05 NOTE — Progress Notes (Signed)
 Pt. Refused to wear CPAP. RN made aware. Pt is resting comfortably on 3L at this time.

## 2024-05-05 NOTE — Progress Notes (Signed)
 05/05/2024 Discussed with primary. Agree with NIV for DC if can be arranged.  Ardelle Kos MD PCCM

## 2024-05-06 DIAGNOSIS — J9602 Acute respiratory failure with hypercapnia: Secondary | ICD-10-CM | POA: Diagnosis not present

## 2024-05-06 DIAGNOSIS — Z992 Dependence on renal dialysis: Secondary | ICD-10-CM | POA: Diagnosis not present

## 2024-05-06 DIAGNOSIS — G9341 Metabolic encephalopathy: Secondary | ICD-10-CM | POA: Diagnosis not present

## 2024-05-06 DIAGNOSIS — N186 End stage renal disease: Secondary | ICD-10-CM | POA: Diagnosis not present

## 2024-05-06 LAB — CBC
HCT: 26.1 % — ABNORMAL LOW (ref 39.0–52.0)
Hemoglobin: 8.2 g/dL — ABNORMAL LOW (ref 13.0–17.0)
MCH: 29.7 pg (ref 26.0–34.0)
MCHC: 31.4 g/dL (ref 30.0–36.0)
MCV: 94.6 fL (ref 80.0–100.0)
Platelets: 135 10*3/uL — ABNORMAL LOW (ref 150–400)
RBC: 2.76 MIL/uL — ABNORMAL LOW (ref 4.22–5.81)
RDW: 16 % — ABNORMAL HIGH (ref 11.5–15.5)
WBC: 7.3 10*3/uL (ref 4.0–10.5)
nRBC: 0 % (ref 0.0–0.2)

## 2024-05-06 LAB — RENAL FUNCTION PANEL
Albumin: 2.3 g/dL — ABNORMAL LOW (ref 3.5–5.0)
Anion gap: 8 (ref 5–15)
BUN: 24 mg/dL — ABNORMAL HIGH (ref 8–23)
CO2: 29 mmol/L (ref 22–32)
Calcium: 8.5 mg/dL — ABNORMAL LOW (ref 8.9–10.3)
Chloride: 98 mmol/L (ref 98–111)
Creatinine, Ser: 7.53 mg/dL — ABNORMAL HIGH (ref 0.61–1.24)
GFR, Estimated: 7 mL/min — ABNORMAL LOW (ref >60)
Glucose, Bld: 63 mg/dL — ABNORMAL LOW (ref 70–99)
Phosphorus: 6.7 mg/dL — ABNORMAL HIGH (ref 2.5–4.6)
Potassium: 4.2 mmol/L (ref 3.5–5.1)
Sodium: 135 mmol/L (ref 135–145)

## 2024-05-06 LAB — GLUCOSE, CAPILLARY
Glucose-Capillary: 79 mg/dL (ref 70–99)
Glucose-Capillary: 85 mg/dL (ref 70–99)
Glucose-Capillary: 87 mg/dL (ref 70–99)
Glucose-Capillary: 79 mg/dL (ref 70–99)
Glucose-Capillary: 99 mg/dL (ref 70–99)
Glucose-Capillary: 94 mg/dL (ref 70–99)

## 2024-05-06 LAB — CULTURE, BLOOD (ROUTINE X 2)
Culture: NO GROWTH
Culture: NO GROWTH
Special Requests: ADEQUATE

## 2024-05-06 MED ORDER — MELATONIN 3 MG PO TABS
3.0000 mg | ORAL_TABLET | Freq: Every day | ORAL | Status: DC
  Administered 2024-05-06: 3 mg via ORAL
  Filled 2024-05-06: qty 1

## 2024-05-06 MED ORDER — CHLORHEXIDINE GLUCONATE CLOTH 2 % EX PADS
6.0000 | MEDICATED_PAD | Freq: Every day | CUTANEOUS | Status: DC
  Administered 2024-05-07: 6 via TOPICAL

## 2024-05-06 NOTE — Progress Notes (Signed)
 [facility] Progress Note  Subjective:  Seen in room Not talkative today  Vitals:   05/06/24 0732 05/06/24 0748 05/06/24 0800 05/06/24 0900  BP:   127/65   Pulse: 60  64 (!) 55  Resp: 14  18 17   Temp:  97.9 F (36.6 C)    TempSrc:  Axillary    SpO2: 100%  100% 99%  Weight:        Exam: General:  adult male, sig frail in appearance Heart: S1S2 no rub Lungs: dec'd R base, o/w clear Abdomen: soft/nt/nd Extremities: no edema appreciated LUE AVF +bruit     Renal-related home meds: Hydralazine  25 tid Lisinopril  50 Renavite Velphoro  1 gm ac tid    [facility]  [facility]  B450    72kg   AVF  Heparin  3000 + 3000 midrun Mircera 75 micrograms q 2, last 3/11 Hectorol  6 mcg Sensipar  30mg  three times per week    Assessment/ Plan: AMS: possibly d/t CO2 retention. Was on vent, now extubated.  Acute hypercapneic resp failure/ chronic R pleural effusion+fibrothorax: per pulm. Not much evidence for vol overload.  ESRD: TTS HD, hx poor compliance.  BP: sp pressors in ICU, midodrine added here 10mg  tid. Continue. Home bp meds on hold.  Volume: 1.5 and 1.0 L w/ HD x 2, also 2-6kg under dry wt, will lower on dc Anemia esrd: Hb 8-10 here, follow.  Secondary hyperparathyroidism: CCa and phos in range. Cont binders, sensipar , IV vdra.      Patrick Poag MD  [facility] 05/06/2024, 9:53 AM  Recent Labs  Lab 05/05/24 0400 05/05/24 0936 05/06/24 0225  HGB 8.9*  --  8.2*  ALBUMIN  --  2.5* 2.3*  CALCIUM   --  8.5* 8.5*  PHOS  --  5.8* 6.7*  CREATININE  --  6.34* 7.53*  K  --  4.0 4.2   No results for input(s): "IRON", "TIBC", "FERRITIN" in the last 168 hours. Inpatient medications:  apixaban   5 mg Oral BID   aspirin   81 mg Oral Daily   atorvastatin   80 mg Oral Daily   Chlorhexidine  Gluconate Cloth  6 each Topical Q0600   [START ON 05/07/2024] cinacalcet   30 mg Oral Q T,Th,Sa-HD   doxercalciferol   6 mcg Intravenous Q M,W,F-HD   feeding supplement (NEPRO CARB STEADY)  237  mL Oral BID BM   insulin  aspart  0-9 Units Subcutaneous Q4H   lactulose  30 g Oral BID   levothyroxine   50 mcg Oral Q0600   midodrine  10 mg Oral TID WC   multivitamin  1 tablet Oral Daily   sucroferric oxyhydroxide  1,000 mg Oral TID WC    cefTRIAXone  (ROCEPHIN )  IV 2 g (05/05/24 2103)   acetaminophen , mouth rinse

## 2024-05-06 NOTE — Progress Notes (Addendum)
 HD#5 Subjective:   Summary: [name] is a 73 y.o. male with a pertinent PMH of A-fib on A/C, type 2 diabetes, hypertension, ESRD on hemodialysis who presented from SNF after becoming progressively more altered.  He required brief stay in the ICU for intubation and pressure support presumably in the setting of urosepsis before being extubated and transferred to the IMTS on 05/04/2024.  Overnight Events: Refused BiPAP  Patient feeling well this AM. At baseline per patient. Refused CPAP d/t noise it makes and discomfort. Discussed indications - patient will try again tonight with earplugs.   Objective:  Vital signs in last 24 hours: Vitals:   05/06/24 0732 05/06/24 0748 05/06/24 0800 05/06/24 0900  BP:   127/65   Pulse: 60  64 (!) 55  Resp: 14  18 17   Temp:  97.9 F (36.6 C)    TempSrc:  Axillary    SpO2: 100%  100% 99%  Weight:        Physical Exam:  Constitutional: Chronically- ill appearing. NAD Cardiovascular: regular rate and rhythm. 3/6 systolic murmur  Pulmonary/Chest: normal work of breathing on room air, lungs clear to auscultation bilaterally Abdominal: soft, non-distended. Mild epigastric tenderness, no signs of peritonitis.  Neurological: alert & oriented Skin: warm and dry  Filed Weights   05/04/24 1816 05/05/24 0500 05/06/24 0500  Weight: 68.6 kg 67.7 kg 70.4 kg      Intake/Output Summary (Last 24 hours) at 05/06/2024 0944 Last data filed at 05/05/2024 1800 Gross per 24 hour  Intake 360 ml  Output --  Net 360 ml   Net IO Since Admission: -595.68 mL [05/06/24 0944]  Pertinent Labs:    Latest Ref Rng & Units 05/06/2024    2:25 AM 05/05/2024    4:00 AM 05/04/2024    5:55 AM  CBC  WBC 4.0 - 10.5 K/uL 7.3  7.3  7.2   Hemoglobin 13.0 - 17.0 g/dL 8.2  8.9  8.3   Hematocrit 39.0 - 52.0 % 26.1  29.3  26.5   Platelets 150 - 400 K/uL 135  116  129        Latest Ref Rng & Units 05/06/2024    2:25 AM 05/05/2024    9:36 AM 05/04/2024    6:01 AM  CMP   Glucose 70 - 99 mg/dL 63  811  914   BUN 8 - 23 mg/dL 24  18  26    Creatinine 0.61 - 1.24 mg/dL 7.82  9.56  2.13   Sodium 135 - 145 mmol/L 135  134  135   Potassium 3.5 - 5.1 mmol/L 4.2  4.0  3.6   Chloride 98 - 111 mmol/L 98  98  94   CO2 22 - 32 mmol/L 29  26  29    Calcium  8.9 - 10.3 mg/dL 8.5  8.5  8.8     Assessment/Plan:   Principal Problem:   Metabolic encephalopathy Active Problems:   Poor venous access   ESRD on dialysis [facility])   Patient Summary: [name] is a 73 y.o. male with a pertinent PMH of A-fib on A/C, type 2 diabetes, hypertension, ESRD on hemodialysis who presented from SNF after becoming progressively more altered.  He required brief stay in the ICU for intubation and pressure support presumably in the setting of urosepsis before being extubated and transferred to the IMTS on 05/04/2024, now on hospital day 5.   #Altered mental status - Resolved #Sepsis with septic shock in the setting of  E. coli UTI - Resolved Resolved - Finished 5/5 days of ceftriaxone .  Plan: - Midodrine 10 mg 3 times daily  #Acute respiratory failure with hypoxia and hypercapnia VBG following BiPAP showing improved resp status. Will require NIV outpatient as described by Dr. Hayes Lipps and Dr. Felipe Horton in previous notes. Will try CPAP again tonight with earplugs for comfort. Hypoxia has resolved. Plan: - Nightly / PRN CPAP  #Chronic right pleural effusion # Lymphocytic predominant, exudative pleural effusion Extensive workup last admission, in brief patient presenting with persistent right-sided pleural effusions.  This seems to be stable compared to prior admission.  Does have outpatient follow-up in place from prior admission. Plan:  - Patient scheduled to follow-up with pulmonology 6/5   # ESRD (Tue/Th/ Sat) # Normocytic anemia Stable. Plan: -Dialysis per nephrology, appreciate their help.   # A-fib on Eliquis  - Continued Eliquis  5 mg.  #Hypothyroidism - Continue  levothyroxine  50 mcg   #Hyperlipidemia #PVD - Continue Lipitor  80 mg daily - ASA 81  Skin tears BLE - Local wound care   Diet: Renal IVF: None,None VTE: DOAC Code: Full  Dispo: Anticipated discharge to prior LTC in 2-3 days pending clinical improvement, stability.   [name] MD Internal Medicine Resident PGY-1 Pager: [phone] Please contact the on call pager after 5 pm and on weekends at [phone].

## 2024-05-06 NOTE — Progress Notes (Signed)
 RT at bedside to find pt on [place] with BiPAP on st[place] at bedside.

## 2024-05-06 NOTE — Plan of Care (Signed)
  Problem: Nutritional: Goal: Maintenance of adequate nutrition will improve Outcome: Progressing   Problem: Skin Integrity: Goal: Risk for impaired skin integrity will decrease Outcome: Progressing   Problem: Pain Managment: Goal: General experience of comfort will improve and/or be controlled Outcome: Progressing   Problem: Safety: Goal: Ability to remain free from injury will improve Outcome: Progressing   Problem: Skin Integrity: Goal: Risk for impaired skin integrity will decrease Outcome: Progressing

## 2024-05-07 LAB — GLUCOSE, CAPILLARY
Glucose-Capillary: 101 mg/dL — ABNORMAL HIGH (ref 70–99)
Glucose-Capillary: 70 mg/dL (ref 70–99)
Glucose-Capillary: 68 mg/dL — ABNORMAL LOW (ref 70–99)
Glucose-Capillary: 98 mg/dL (ref 70–99)

## 2024-05-07 LAB — RENAL FUNCTION PANEL
Albumin: 2.3 g/dL — ABNORMAL LOW (ref 3.5–5.0)
Anion gap: 11 (ref 5–15)
BUN: 39 mg/dL — ABNORMAL HIGH (ref 8–23)
CO2: 26 mmol/L (ref 22–32)
Calcium: 8.6 mg/dL — ABNORMAL LOW (ref 8.9–10.3)
Chloride: 97 mmol/L — ABNORMAL LOW (ref 98–111)
Creatinine, Ser: 9.18 mg/dL — ABNORMAL HIGH (ref 0.61–1.24)
GFR, Estimated: 5 mL/min — ABNORMAL LOW (ref >60)
Glucose, Bld: 94 mg/dL (ref 70–99)
Phosphorus: 6.8 mg/dL — ABNORMAL HIGH (ref 2.5–4.6)
Potassium: 4.4 mmol/L (ref 3.5–5.1)
Sodium: 134 mmol/L — ABNORMAL LOW (ref 135–145)

## 2024-05-07 MED ORDER — MIDODRINE HCL 5 MG PO TABS
ORAL_TABLET | ORAL | Status: AC
  Filled 2024-05-07: qty 2

## 2024-05-07 MED ORDER — MIDODRINE HCL 10 MG PO TABS: 10.0000 mg | ORAL_TABLET | Freq: Three times a day (TID) | ORAL | Status: DC

## 2024-05-07 MED ORDER — ALTEPLASE 2 MG IJ SOLR: 2.0000 mg | Freq: Once | INTRAMUSCULAR | Status: DC | PRN

## 2024-05-07 MED ORDER — HEPARIN SODIUM (PORCINE) 1000 UNIT/ML DIALYSIS: 2000.0000 [IU] | INTRAMUSCULAR | Status: DC | PRN

## 2024-05-07 MED ORDER — ANTICOAGULANT SODIUM CITRATE 4% (200MG/5ML) IV SOLN: 5.0000 mL | Status: DC | PRN

## 2024-05-07 MED ORDER — HEPARIN SODIUM (PORCINE) 1000 UNIT/ML DIALYSIS: 1000.0000 [IU] | INTRAMUSCULAR | Status: DC | PRN

## 2024-05-07 MED ORDER — HEPARIN SODIUM (PORCINE) 1000 UNIT/ML IJ SOLN
INTRAMUSCULAR | Status: AC
  Filled 2024-05-07: qty 3

## 2024-05-07 MED ORDER — HEPARIN SODIUM (PORCINE) 1000 UNIT/ML DIALYSIS: 4000.0000 [IU] | Freq: Once | INTRAMUSCULAR | Status: DC

## 2024-05-07 MED ORDER — DOXERCALCIFEROL 4 MCG/2ML IV SOLN
INTRAVENOUS | Status: AC
  Filled 2024-05-07: qty 2

## 2024-05-07 NOTE — Hospital Course (Addendum)
 Patient Summary: [name] is a 73 y.o. male with a pertinent PMH of A-fib on A/C, type 2 diabetes, hypertension, ESRD on hemodialysis who presented from SNF after becoming progressively more altered.  He required brief stay in the ICU for intubation and pressure support in the setting of urosepsis and hypoxic hypercapnic respiratory failure before being extubated and transferred to the IMTS on 05/04/2024.  Following extubation the patient did display some episodes of altered mental status and confusional which is felt to be secondary to hypercapnic respiratory failure, he was treated with BiPAP and was started on nightly CPAP.  #Altered mental status - Resolved #Sepsis with septic shock in the setting of E. coli UTI - Resolved Resolved - Finished 5/5 days of ceftriaxone  for E. coli UTI. Plan: - Discontinue antihypertensive regiment given soft blood pressures. - Midodrine 10 mg 3 times daily - Resume antihypertensive regimen and titrate midodrine as tolerated outpatient.  #Acute respiratory failure with hypoxia and hypercapnia Initially improved with intubation, did have repeat hypercapnia following extubation which was treated with BiPAP.  He will require NIV outpatient as described by Dr. Hayes Lipps and Dr. Felipe Horton in previous notes.  The patient did have a tendency to refuse CPAP at night, indications and risks of mild compliance were described to the patient.  Specifically, the patient was instructed that noncompliance with noninvasive ventilation can lead to worsening hypercapnia, somnolence, respiratory failure, and death.  The patient demonstrated understanding of this. Plan: - Nightly / PRN CPAP    - CPAP setting this admission: IPAP 12 EPAP 6 no O2 bleed-in   #Chronic right pleural effusion # Lymphocytic predominant, exudative pleural effusion Extensive workup last admission, in brief patient presenting with persistent right-sided pleural effusions.  This seems to be stable compared to  prior admission.  Does have outpatient follow-up in place from prior admission. Plan:  - Patient scheduled to follow-up with pulmonology 6/5   # ESRD (Tue/Th/ Sat) # Normocytic anemia Stable, received inpatient dialysis while admitted. Plan: -Dialysis per nephrology, appreciate their help.    # A-fib on Eliquis  - Continued Eliquis  5 mg.   #Hypothyroidism - Continue levothyroxine  50 mcg #Hyperlipidemia #PVD - Continue Lipitor  80 mg daily - ASA 81  Skin tears BLE - Local wound care

## 2024-05-07 NOTE — TOC Transition Note (Signed)
 Transition of Care [facility]) - Discharge Note   Patient Details  Name: [name] MRN: [id number] Date of Birth: 1951/04/02  Transition of Care [facility]) CM/SW Contact:  Katrinka Parr, LCSW Phone Number: 05/07/2024, 2:58 PM   Clinical Narrative:      Per MD patient ready for DC to [name]. RN, patient, and facility notified of DC. Discharge Summary and FL2 sent to facility. RN to call report prior to discharge [phone]). DC packet on chart. Ambulance transport requested for patient.   CSW will sign off for now as social work intervention is no longer needed. Please consult us  again if new needs arise.   Final next level of care: Skilled Nursing Facility Barriers to Discharge: No Barriers Identified       Discharge Placement              Patient chooses bed at:  [facility]) Patient to be transferred to facility by: [facility]   Patient and family notified of of transfer: 05/07/24  Discharge Plan and Services Additional resources added to the After Visit Summary for                                       Social Drivers of Health (SDOH) Interventions SDOH Screenings   Food Insecurity: No Food Insecurity (05/07/2024)  Housing: Low Risk  (05/07/2024)  Transportation Needs: No Transportation Needs (05/07/2024)  Utilities: Not At Risk (05/07/2024)  Social Connections: Moderately Isolated (05/07/2024)  Tobacco Use: Medium Risk (05/01/2024)     Readmission Risk Interventions     No data to display

## 2024-05-07 NOTE — Discharge Instructions (Signed)
 You were hospitalized for urinary tract infection and breathing problems which caused you to be in a coma. Thank you for allowing us  to be part of your care.   We arranged for you to follow up at:  Pulmonology-May 16, 2024 with Eulas Hick, NP at [facility] pulmonary care. Please call your primary care doctor in order to schedule appointment with them as well.   Please note these changes made to your medications:   Please START taking:  Midodrine  10 mg 3 times a day.  Please STOP taking:  Your blood pressure medications including hydralazine , lisinopril , and metoprolol .  Please make sure to use your CPAP when sleeping at night, or when taking a nap.  If you do not use this machine you may have worsening of your breathing problems and can have respiratory failure leading to death.

## 2024-05-07 NOTE — Progress Notes (Signed)
 Received patient in bed.Alert and oriented x 3.Consent verified.  Access used: Left arm avf that worked well.  Duration of treatment: 3.5 hours.  Uf goal: 2 liters.  Medicine given:Heparin  3,000 unit pre and mid run doses.                          Hectorol  6 mcg                          Midodrine 10 mg  Hand off to the patient's nurse,back into his room with stable condition via transporter.

## 2024-05-07 NOTE — Discharge Planning (Signed)
 [facility] Patient Discharge Orders- [facility] CLINIC: [place]  Patient's name: [name] Admit/DC Dates: 05/01/2024 - 05/07/2024  Discharge Diagnoses: Metabolic encephalopathy   E. Coli UTI  Aranesp : Given: none   Date and amount of last dose: N/A  Last Hgb: 8.2 PRBC's Given: none Date/# of units: N/A ESA dose for discharge: same dose IV Iron dose at discharge: none  Heparin  change: no  EDW Change: Yes New EDW: 69kg  [place] Change: no  Access intervention/Change: no Details:  Hectorol /Calcitriol  change: no  Discharge Labs: Calcium  8.6 Phosphorus 6.8 Albumin 2.3 K+ 4.4  IV Antibiotics: no Details:  On Coumadin?: no Last INR: Next INR: Managed By:   OTHER/APPTS/LAB ORDERS:    D/C Meds to be reconciled by nurse after every discharge.  Completed By: Ramona Burner, PA-C 05/07/2024, 8:35 PM  [facility] Pager: [phone]   Reviewed by: MD:______ RN_______

## 2024-05-07 NOTE — Progress Notes (Signed)
D/C order noted. Contacted [facility] to advise clinic of pt's d/c today and that pt should resume care on Thursday.   Olivia Canter Renal Navigator [phone]

## 2024-05-07 NOTE — Progress Notes (Signed)
 [facility] Progress Note  Subjective:  Seen in room Not talkative today  Vitals:   05/07/24 0930 05/07/24 1001 05/07/24 1030 05/07/24 1100  BP: (!) 133/45 135/64 (!) 127/39 (!) 134/59  Pulse: (!) 49 (!) 49  (!) 55  Resp: 12 16  13   Temp:      TempSrc:      SpO2: 96% 96%  96%  Weight:        Exam: General:  adult male, sig frail in appearance Heart: S1S2 no rub Lungs: dec'd R base, o/w clear Abdomen: soft/nt/nd Extremities: no edema appreciated LUE AVF +bruit     Renal-related home meds: Hydralazine  25 tid Lisinopril  50 Renavite Velphoro  1 gm ac tid    [facility]: [facility]  4h  B450    72kg   AVF  Heparin  3000 + 3000 midrun Mircera 75 micrograms q 2, last 3/11 Hectorol  6 mcg Sensipar  30mg  three times per week    Assessment/ Plan: AMS: possibly d/t CO2 retention. MS better.  Sepsis/ shock/ E.Coli UTI: resolved, sp IV rocephin  x 5d Acute hypercapneic resp failure/ chronic R pleural effusion+fibrothorax: pt is better but will require NIV outpt, per pmd.  ESRD: TTS HD. HD today.  BP: midodrine added here 10mg  tid, home bp meds on hold.  Volume: 2-6kg under dry wt, no ^vol on exam. Min UF today.   Anemia esrd: Hb 8-10 here, follow.  Secondary hyperparathyroidism: CCa and phos in range. Cont binders, sensipar , IV vdra.      Larry Poag MD  [facility] 05/07/2024, 11:13 AM  Recent Labs  Lab 05/05/24 0400 05/05/24 0936 05/06/24 0225 05/07/24 0219  HGB 8.9*  --  8.2*  --   ALBUMIN  --    < > 2.3* 2.3*  CALCIUM   --    < > 8.5* 8.6*  PHOS  --    < > 6.7* 6.8*  CREATININE  --    < > 7.53* 9.18*  K  --    < > 4.2 4.4   < > = values in this interval not displayed.   No results for input(s): "IRON", "TIBC", "FERRITIN" in the last 168 hours. Inpatient medications:  apixaban   5 mg Oral BID   aspirin   81 mg Oral Daily   atorvastatin   80 mg Oral Daily   Chlorhexidine  Gluconate Cloth  6 each Topical Q0600   Chlorhexidine  Gluconate Cloth  6 each Topical Q0600    cinacalcet   30 mg Oral Q T,Th,Sa-HD   doxercalciferol   6 mcg Intravenous Q M,W,F-HD   feeding supplement (NEPRO CARB STEADY)  237 mL Oral BID BM   [START ON 05/08/2024] heparin   4,000 Units Dialysis Once in dialysis   insulin  aspart  0-9 Units Subcutaneous Q4H   levothyroxine   50 mcg Oral Q0600   melatonin  3 mg Oral QHS   midodrine  10 mg Oral TID WC   multivitamin  1 tablet Oral Daily   sucroferric oxyhydroxide  1,000 mg Oral TID WC    anticoagulant sodium citrate      acetaminophen , alteplase , anticoagulant sodium citrate , heparin , [START ON 05/08/2024] heparin , mouth rinse

## 2024-05-07 NOTE — Discharge Summary (Cosign Needed Addendum)
 Name: [name] MRN: [id number] DOB: Jul 12, 1951 73 y.o. PCP: Patrick Dart., [name]  Date of Admission: 05/01/2024  7:29 AM Date of Discharge: 05/07/2024 05/07/2024 Attending Physician: Dr. Broadus Canes  DISCHARGE DIAGNOSIS:  Primary Problem: Sepsis with encephalopathy and septic shock [facility])   Hospital Problems: Principal Problem:   Sepsis with encephalopathy and septic shock (HCC) Active Problems:   E. coli UTI (urinary tract infection)   Acute respiratory failure with hypercapnia (HCC)   Thrombocytopenia (HCC)   Hypotension   Pleural effusion on right   Poor venous access   ESRD on dialysis (HCC)    DISCHARGE MEDICATIONS:   Allergies as of 05/07/2024       Reactions   Shellfish Allergy Swelling   Shrimp (diagnostic) Swelling        Medication List     STOP taking these medications    hydrALAZINE  25 MG tablet Commonly known as: APRESOLINE    lisinopril  40 MG tablet Commonly known as: ZESTRIL    metoprolol  succinate 25 MG 24 hr tablet Commonly known as: TOPROL -XL       TAKE these medications    acetaminophen  325 MG tablet Commonly known as: TYLENOL  Take 650 mg by mouth every 6 (six) hours as needed for mild pain (pain score 1-3) or fever.   apixaban  5 MG Tabs tablet Commonly known as: ELIQUIS  Take 1 tablet (5 mg total) by mouth 2 (two) times daily.   aspirin  81 MG chewable tablet Chew 81 mg by mouth daily.   atorvastatin  80 MG tablet Commonly known as: LIPITOR  Take 80 mg by mouth at bedtime.   cinacalcet  30 MG tablet Commonly known as: SENSIPAR  Take 1 tablet (30 mg total) by mouth every Tuesday, Thursday, and Saturday at 6 PM.   docusate sodium  100 MG capsule Commonly known as: COLACE Take 100 mg by mouth daily.   Glucagon Emergency 1 MG Kit Inject 1 mg into the skin as needed (hypoglycemia).   levothyroxine  50 MCG tablet Commonly known as: SYNTHROID  Take 50 mcg by mouth daily before breakfast.   midodrine 10 MG tablet Commonly  known as: PROAMATINE Take 1 tablet (10 mg total) by mouth 3 (three) times daily with meals.   multivitamin Tabs tablet Take 1 tablet by mouth daily.   Velphoro  500 MG chewable tablet Generic drug: sucroferric oxyhydroxide Chew 1,000 mg by mouth 3 (three) times daily with meals.               Discharge Care Instructions  (From admission, onward)           Start     Ordered   05/07/24 0000  Change dressing (specify)       Comments: Dressing change: Change lower extremity dressings as needed to ensure they are clean and dry.   05/07/24 1306            DISPOSITION AND FOLLOW-UP:  [name] was discharged from [facility] in [place] condition. At the hospital follow up visit please address:  Follow-up Recommendations: Consults: Pulmonary medicine Labs: TSH, CBC, and Comprehensive Metabolic Panel Studies: Can consider repeat chest x-ray in case of worsening shortness of breath, chest pain, or cough. Medications: Initiating midodrine 3 times daily, holding antihypertensive regimen.  Please recheck TSH following resolution of acute illness and adjust levothyroxine  as needed. Follow-up Appointments: Pulmonology-May 16, 2024 with Patrick Hick, NP at [facility] pulmonary care.  Contact information for after-discharge care     Destination     [facility]  SNF .   Service: Skilled Nursing Contact information: [address] [place]  [address] [phone]                     HOSPITAL COURSE:  Patient Summary: Patient Summary: [name] is a 73 y.o. male with a pertinent PMH of A-fib on A/C, type 2 diabetes, hypertension, ESRD on hemodialysis who presented from SNF after becoming progressively more altered.  He required brief stay in the ICU for intubation and pressure support in the setting of urosepsis and hypoxic hypercapnic respiratory failure before being extubated and transferred to the IMTS on  05/04/2024.  Following extubation the patient did display some episodes of altered mental status and confusional which is felt to be secondary to hypercapnic respiratory failure, he was treated with BiPAP and was started on nightly CPAP.  #Altered mental status - Resolved #Sepsis with septic shock in the setting of E. coli UTI - Resolved Resolved - Finished 5/5 days of ceftriaxone  for E. coli UTI. Plan: - Discontinue antihypertensive regiment given soft blood pressures. - Midodrine 10 mg 3 times daily - Resume antihypertensive regimen and titrate midodrine as tolerated outpatient.  #Acute respiratory failure with hypoxia and hypercapnia Initially improved with intubation, did have repeat hypercapnia following extubation which was treated with BiPAP.  He will require NIV outpatient as described by Dr. Hayes [name] and Dr. Felipe [name] in previous notes.  The patient did have a tendency to refuse CPAP at night, indications and risks of mild compliance were described to the patient.  Specifically, the patient was instructed that noncompliance with noninvasive ventilation can lead to worsening hypercapnia, somnolence, respiratory failure, and death.  The patient demonstrated understanding of this. Plan: - Nightly / PRN CPAP    - CPAP setting this admission: IPAP 12 EPAP 6 no O2 bleed-in   #Chronic right pleural effusion # Lymphocytic predominant, exudative pleural effusion Extensive workup last admission, in brief patient presenting with persistent right-sided pleural effusions.  This seems to be stable compared to prior admission.  Does have outpatient follow-up in place from prior admission. Plan:  - Patient scheduled to follow-up with pulmonology 6/5   # ESRD (Tue/Th/ Sat) # Normocytic anemia Stable, received inpatient dialysis while admitted. Plan: -Dialysis per nephrology, appreciate their help.    # A-fib on Eliquis  - Continued Eliquis  5 mg.   #Hypothyroidism - Continue levothyroxine  50  mcg #Hyperlipidemia #PVD - Continue Lipitor  80 mg daily - ASA 81  Skin tears BLE - Local wound care   DISCHARGE INSTRUCTIONS:   Discharge Instructions     Call MD for:  difficulty breathing, headache or visual disturbances   Complete by: As directed    Call MD for:  extreme fatigue   Complete by: As directed    Call MD for:  persistant dizziness or light-headedness   Complete by: As directed    Call MD for:  persistant nausea and vomiting   Complete by: As directed    Call MD for:  severe uncontrolled pain   Complete by: As directed    Call MD for:  temperature >100.4   Complete by: As directed    Change dressing (specify)   Complete by: As directed    Dressing change: Change lower extremity dressings as needed to ensure they are clean and dry.   Diet - low sodium heart healthy   Complete by: As directed    Discharge instructions   Complete by: As directed    You were  hospitalized for urinary tract infection and breathing problems which caused you to be in a coma. Thank you for allowing us  to be part of your care.   We arranged for you to follow up at:  Pulmonology-May 16, 2024 with Patrick Hick, NP at [facility] pulmonary care. Please call your primary care doctor in order to schedule appointment with them as well.   Please note these changes made to your medications:   Please START taking:  Midodrine 10 mg 3 times a day.  Please STOP taking:  Your blood pressure medications including hydralazine , lisinopril , and metoprolol .  Please make sure to use your CPAP when sleeping at night, or when taking a nap.  If you do not use this machine you may have worsening of your breathing problems and can have respiratory failure leading to death.   Increase activity slowly   Complete by: As directed        SUBJECTIVE:  Seen in dialysis today, patient stated he wore CPAP for some time last night.  Compliance limited due to noise the machine makes.  We again discussed the  indications of CPAP with the patient and discussed possible complications of not wearing CPAP including hypercapnic respiratory failure and death.  Patient demonstrated understanding of these indications and possible complications.  Patient states he will be compliant with CPAP outpatient.  Discharge Vitals:   BP (!) 134/51   Pulse (!) 56   Temp 98.3 F (36.8 C) (Oral)   Resp 17   Wt 69 kg   SpO2 99%   BMI 21.83 kg/m   OBJECTIVE:  Constitutional: Chronically- ill appearing. NAD Cardiovascular: regular rate and rhythm. 3/6 systolic murmur  Pulmonary/Chest: normal work of breathing on room air, anterior lung fields clear to auscultation bilaterally Neurological: alert & oriented to person, place, time, and situation  Pertinent Labs, Studies, and Procedures:     Latest Ref Rng & Units 05/06/2024    2:25 AM 05/05/2024    4:00 AM 05/04/2024    5:55 AM  CBC  WBC 4.0 - 10.5 K/uL 7.3  7.3  7.2   Hemoglobin 13.0 - 17.0 g/dL 8.2  8.9  8.3   Hematocrit 39.0 - 52.0 % 26.1  29.3  26.5   Platelets 150 - 400 K/uL 135  116  129        Latest Ref Rng & Units 05/07/2024    2:19 AM 05/06/2024    2:25 AM 05/05/2024    9:36 AM  CMP  Glucose 70 - 99 mg/dL 94  63  960   BUN 8 - 23 mg/dL 39  24  18   Creatinine 0.61 - 1.24 mg/dL 4.54  0.98  1.19   Sodium 135 - 145 mmol/L 134  135  134   Potassium 3.5 - 5.1 mmol/L 4.4  4.2  4.0   Chloride 98 - 111 mmol/L 97  98  98   CO2 22 - 32 mmol/L 26  29  26    Calcium  8.9 - 10.3 mg/dL 8.6  8.5  8.5     DG CHEST PORT 1 VIEW Result Date: 05/01/2024 CLINICAL DATA:  Check central line placed EXAM: PORTABLE CHEST 1 VIEW COMPARISON:  Film from earlier in the same day. FINDINGS: Check shadow is stable. Endotracheal tube, gastric catheter and right jugular central line seen. Jugular central line tip is noted at the cavoatrial junction. No pneumothorax is noted. Persistent right-sided pleural effusion and right basilar airspace opacity is noted. Left lung remains  clear.  IMPRESSION: No pneumothorax following central line placement. Electronically Signed   By: Violeta Grey M.D.   On: 05/01/2024 22:09   ECHOCARDIOGRAM LIMITED Result Date: 05/01/2024    ECHOCARDIOGRAM LIMITED REPORT   Patient Name:   TRAEVON MEIRING Date of Exam: 05/01/2024 Medical Rec #:  [id number]       Height:       70.0 in Accession #:    [id number]      Weight:       158.5 lb Date of Birth:  1951/11/06       BSA:          1.891 m Patient Age:    73 years        BP:           146/73 mmHg Patient Gender: M               HR:           63 bpm. Exam Location:  Inpatient Procedure: Limited Echo, Limited Color Doppler and Cardiac Doppler (Both            Spectral and Color Flow Doppler were utilized during procedure). Indications:    Cardiomegaly  History:        Patient has prior history of Echocardiogram examinations, most                 recent 04/06/2024. CHF; Risk Factors:Diabetes and Hypertension.                 CKD.  Sonographer:    Aldon Ambrosia Referring Phys: [id number] Tino Foreman SMITH IMPRESSIONS  1. Septal hypokinesis. Left ventricular ejection fraction, by estimation, is 55 to 60%. The left ventricle has normal function. The left ventricle demonstrates regional wall motion abnormalities (see scoring diagram/findings for description).  2. Right ventricular systolic function is normal. The right ventricular size is normal. There is normal pulmonary artery systolic pressure.  3. The mitral valve is normal in structure. No evidence of mitral valve regurgitation. No evidence of mitral stenosis.  4. The aortic valve is calcified. There is moderate calcification of the aortic valve. There is moderate thickening of the aortic valve. Aortic valve regurgitation is not visualized. Moderate aortic valve stenosis. Aortic valve area, by VTI measures 1.30 cm. Aortic valve mean gradient measures 5.0 mmHg. Aortic valve Vmax measures 1.61 m/s.  5. The inferior vena cava is dilated in size with <50% respiratory  variability, suggesting right atrial pressure of 15 mmHg. FINDINGS  Left Ventricle: Septal hypokinesis. Left ventricular ejection fraction, by estimation, is 55 to 60%. The left ventricle has normal function. The left ventricle demonstrates regional wall motion abnormalities. The left ventricular internal cavity size was normal in size. There is no left ventricular hypertrophy.  LV Wall Scoring: The entire septum is hypokinetic. The entire anterior wall, entire lateral wall, entire inferior wall, and apex are normal. Right Ventricle: The right ventricular size is normal. No increase in right ventricular wall thickness. Right ventricular systolic function is normal. There is normal pulmonary artery systolic pressure. The tricuspid regurgitant velocity is 1.71 m/s, and  with an assumed right atrial pressure of 15 mmHg, the estimated right ventricular systolic pressure is 26.7 mmHg. Left Atrium: Left atrial size was normal in size. Right Atrium: Right atrial size was normal in size. Pericardium: There is no evidence of pericardial effusion. Mitral Valve: The mitral valve is normal in structure. No evidence of mitral valve stenosis. Tricuspid Valve: The tricuspid valve is normal in  structure. Tricuspid valve regurgitation is not demonstrated. No evidence of tricuspid stenosis. Aortic Valve: The aortic valve is calcified. There is moderate calcification of the aortic valve. There is moderate thickening of the aortic valve. Aortic valve regurgitation is not visualized. Moderate aortic stenosis is present. Aortic valve mean gradient measures 5.0 mmHg. Aortic valve peak gradient measures 10.4 mmHg. Aortic valve area, by VTI measures 1.30 cm. Pulmonic Valve: The pulmonic valve was normal in structure. Pulmonic valve regurgitation is not visualized. No evidence of pulmonic stenosis. Aorta: The aortic root is normal in size and structure. Venous: The inferior vena cava is dilated in size with less than 50% respiratory  variability, suggesting right atrial pressure of 15 mmHg. IAS/Shunts: No atrial level shunt detected by color flow Doppler. LEFT VENTRICLE PLAX 2D LVIDd:         4.30 cm LVIDs:         3.10 cm LV PW:         0.90 cm LV IVS:        0.80 cm LVOT diam:     1.80 cm LV SV:         37 LV SV Index:   19 LVOT Area:     2.54 cm  LV Volumes (MOD) LV vol d, MOD A2C: 71.4 ml LV vol d, MOD A4C: 123.0 ml LV vol s, MOD A2C: 25.5 ml LV vol s, MOD A4C: 38.5 ml LV SV MOD A2C:     45.9 ml LV SV MOD A4C:     123.0 ml LV SV MOD BP:      67.3 ml RIGHT VENTRICLE            IVC RV S prime:     6.53 cm/s  IVC diam: 2.00 cm TAPSE (M-mode): 1.8 cm AORTIC VALVE AV Area (Vmax):    1.45 cm AV Area (Vmean):   1.23 cm AV Area (VTI):     1.30 cm AV Vmax:           161.00 cm/s AV Vmean:          98.200 cm/s AV VTI:            0.282 m AV Peak Grad:      10.4 mmHg AV Mean Grad:      5.0 mmHg LVOT Vmax:         91.80 cm/s LVOT Vmean:        47.300 cm/s LVOT VTI:          0.144 m LVOT/AV VTI ratio: 0.51 TRICUSPID VALVE TR Peak grad:   11.7 mmHg TR Vmax:        171.00 cm/s  SHUNTS Systemic VTI:  0.14 m Systemic Diam: 1.80 cm Maudine Sos MD Electronically signed by Maudine Sos MD Signature Date/Time: 05/01/2024/2:32:54 PM    Final    EEG adult Result Date: 05/01/2024 Arleene Lack, MD     05/01/2024  4:16 PM Patient Name: NTHONY LEFFERTS MRN: [id number] Epilepsy Attending: Arleene Lack Referring Physician/Provider: Josiah Nigh, MD Date: 05/01/2024 Duration: 23.08 mins Patient history: 73yo M with ams. EEG to evaluate for seizure Level of alertness: Awake/lethargic AEDs during EEG study: None Technical aspects: This EEG study was done with scalp electrodes positioned according to the 10-20 International system of electrode placement. Electrical activity was reviewed with band pass filter of 1-70Hz , sensitivity of 7 uV/mm, display speed of 2mm/sec with a 60Hz  notched filter applied as appropriate. EEG data were recorded  continuously and  digitally stored.  Video monitoring was available and reviewed as appropriate. Description: EEG showed continuous generalized 3 to 6 Hz theta-delta slowing. Hyperventilation and photic stimulation were not performed.   ABNORMALITY - Continuous slow, generalized IMPRESSION: This study is suggestive of moderate diffuse encephalopathy. No seizures or epileptiform discharges were seen throughout the recording. Arleene Lack   DG Abdomen 1 View Result Date: 05/01/2024 CLINICAL DATA:  OG tube placement. EXAM: ABDOMEN - 1 VIEW COMPARISON:  None Available. FINDINGS: Tip and side port of the enteric tube below the diaphragm in the stomach. Air-filled loops of bowel in the central abdomen may be gaseous transverse colon. Formed stool in the left colon. IMPRESSION: Tip and side port of the enteric tube below the diaphragm in the stomach. Electronically Signed   By: Chadwick Colonel M.D.   On: 05/01/2024 10:13   DG Chest Portable 1 View Result Date: 05/01/2024 CLINICAL DATA:  Altered mental status, intubated, hypoxia EXAM: PORTABLE CHEST 1 VIEW COMPARISON:  04/15/2024 05/02/2019 FINDINGS: Endotracheal tube 4.1 cm above the carina. NG tube extends below the hemidiaphragm into the stomach, incompletely imaged. Stable left lung aeration with a left basilar calcified pleural plaque. Persistent loculated moderate right pleural effusion and associated right mid and lower lung collapse/consolidation. No significant interval change. No large pneumothorax. Aorta atherosclerotic. Degenerative changes of the spine and left shoulder IMPRESSION: 1. Endotracheal tube 4.1 cm above the carina. 2. Persistent loculated right pleural effusion and associated right mid and lower lung collapse/consolidation. Electronically Signed   By: Melven Stable.  Shick M.D.   On: 05/01/2024 09:27   CT Head Wo Contrast Result Date: 05/01/2024 CLINICAL DATA:  Mental status change with unknown cause EXAM: CT HEAD WITHOUT CONTRAST TECHNIQUE:  Contiguous axial images were obtained from the base of the skull through the vertex without intravenous contrast. RADIATION DOSE REDUCTION: This exam was performed according to the departmental dose-optimization program which includes automated exposure control, adjustment of the mA and/or kV according to patient size and/or use of iterative reconstruction technique. COMPARISON:  Head CT 04/05/2024 FINDINGS: Brain: No evidence of acute infarction, hemorrhage, hydrocephalus, extra-axial collection or mass lesion/mass effect. Chronic small vessel ischemia which is extensive in the cerebral white matter with chronic lacunar infarct at the genu of the left internal capsule and right upper brainstem. Generalized volume loss. Vascular: No hyperdense vessel or unexpected calcification. Skull: Normal. Negative for fracture or focal lesion. Sinuses/Orbits: No acute finding. IMPRESSION: No acute finding. Chronic small vessel ischemia. Electronically Signed   By: Ronnette Coke M.D.   On: 05/01/2024 09:23     Signed: [name] MD Internal Medicine Resident, PGY-1 [facility]  Pager: [phone] 11:26 AM, 05/08/2024

## 2024-05-15 ENCOUNTER — Telehealth: Payer: Self-pay

## 2024-05-15 NOTE — Telephone Encounter (Signed)
 Pt is scheduled to see Irby Mannan, NP tomorrow for a hospital f/u. Pt has never been seen by our office or one of our providers while admitted. Pt would need to see a MD first. Please have pt scheduled with a MD.

## 2024-05-15 NOTE — Telephone Encounter (Signed)
 Okay to keep appointment

## 2024-05-15 NOTE — Telephone Encounter (Signed)
 It looks like this patient was seen by Felton Hough on May 6th and Dr.Dewald on May 3rd in the hospital. I found their progress notes. Is it okay to keep the hospital follow up with [name] tomorrow?

## 2024-05-16 ENCOUNTER — Ambulatory Visit: Admitting: Primary Care

## 2024-05-16 ENCOUNTER — Ambulatory Visit (INDEPENDENT_AMBULATORY_CARE_PROVIDER_SITE_OTHER)

## 2024-05-16 ENCOUNTER — Other Ambulatory Visit: Payer: Self-pay | Admitting: Primary Care

## 2024-05-16 VITALS — HR 68 | Ht 70.0 in | Wt 156.5 lb

## 2024-05-16 DIAGNOSIS — J90 Pleural effusion, not elsewhere classified: Secondary | ICD-10-CM

## 2024-05-16 DIAGNOSIS — N186 End stage renal disease: Secondary | ICD-10-CM | POA: Diagnosis not present

## 2024-05-16 DIAGNOSIS — Z992 Dependence on renal dialysis: Secondary | ICD-10-CM | POA: Diagnosis not present

## 2024-05-16 DIAGNOSIS — J9602 Acute respiratory failure with hypercapnia: Secondary | ICD-10-CM | POA: Diagnosis not present

## 2024-05-16 DIAGNOSIS — Z87891 Personal history of nicotine dependence: Secondary | ICD-10-CM

## 2024-05-16 NOTE — Patient Instructions (Addendum)
-  CHRONIC RIGHT PLEURAL EFFUSION: A pleural effusion is a buildup of fluid between the tissues that line the lungs and the chest. You have a moderate effusion that is causing shortness of breath. I consulted with Dr. Diania Fortes, moderate right sided pleural effusion is chronic. Not a surgical candiate for decortication.  Reasonable to leave alone for now. If your symptoms worsen please present to ED, we may need to place a chest tube.   -ACUTE RESPIRATORY FAILURE WITH HYPERCAPNIA: Acute respiratory failure with hypercapnia means your lungs are not removing enough carbon dioxide from your blood. You previously needed intubation and BiPAP. It is very important to continue using your CPAP every night to prevent your condition from worsening.  -SEPSIS WITH ENCEPHALOPATHY AND SEPTIC SHOCK DUE TO E. COLI UTI: Sepsis is a severe infection that spreads throughout the body, and encephalopathy is a condition that affects the brain. You were treated for sepsis and septic shock caused by a urinary tract infection with E. coli. There are no current signs of infection or confusion.  -CHRONIC KIDNEY DISEASE, STAGE 5, ON DIALYSIS: Chronic kidney disease stage 5 means your kidneys are no longer working well enough to meet your body's needs, and you require dialysis. You will continue with your dialysis treatments as directed by your nephrologist.  -HISTORY OF ATRIAL FIBRILLATION ON ANTICOAGULATION: Atrial fibrillation is an irregular and often rapid heart rate that can increase your risk of strokes. You are taking Eliquis  to prevent blood clots. Continue taking Eliquis  as prescribed.  INSTRUCTIONS: If your shortness of breath worsens, please present to ED. Continue with your dialysis treatments and take Eliquis  as prescribed. Follow up with interventional radiology for potential outpatient drainage of your pleural effusion. If you have any new symptoms or concerns, please reach out to our office.  Follow-up First  available in 2-6 WEEKS with either Dr. Diania Fortes, Dione Franks or [facility]

## 2024-05-16 NOTE — Progress Notes (Unsigned)
 @Patient  ID: [name], male    DOB: 1951/04/05, 73 y.o.   MRN: [id number]  Chief Complaint  Patient presents with   Shortness of Breath    Referring provider: Shannan Dart., [name]  HPI: 73 year old male, former smoker. PMH significant for CHF, HTN, type 2 diabetes, chronic right pleural effusion, acute respiratory failure, hyperparathyroidism, ESRD on HD, thrombocytopenia, anemia of chronic disease, obesity, MGUS, hyperlipidemia, e.coli UTI.   05/16/2024 Discussed the use of A scribe software for clinical note transcription with the patient, who gave verbal consent to proceed.  History of Present Illness   [name] is a 73 year old male with pleural effusion who presents for a follow-up visit.  He was initially admitted to the hospital from April 25th to May 8th for a pleural effusion following a fall at a skilled nursing facility. Although asymptomatic at presentation, a moderate pleural effusion was discovered, necessitating two thoracenteses and chest tube placement. Fluid analysis revealed chronic inflammation without malignant cells, and AFB smear was negative. He received a five-day course of Unasyn  during this hospitalization. Despite consultations with cardiothoracic surgery and interventional radiology, no surgical intervention or biopsy was recommended due to the lack of an appropriate target and his condition.  He was readmitted on May 21st and discharged on May 27th for sepsis with encephalopathy and septic shock secondary to E. coli UTI, complicated by acute respiratory failure with hypercapnia. He required ICU admission for intubation and pressure support due to urosepsis and hypoxic hypercapnic respiratory failure, and was extubated on May 24th. Post-extubation, he experienced episodes of altered mental status and was managed with BiPAP and nightly CPAP. He completed a five-day course of ceftriaxone .  Currently, he experiences persistent shortness of breath that  began during his hospital stay, though it has not worsened since discharge. He denies any chest pain, fever, cough, or mucus production. His oxygen saturation is 98% on room air. He experiences some swelling in his legs and does not produce urine. He remains mostly indoors at the skilled nursing facility. Receives HD on MWF.   He is on Eliquis  for atrial fibrillation and continues dialysis as per nephrology.      Allergies  Allergen Reactions   Shellfish Allergy Swelling   Shrimp (Diagnostic) Swelling    Immunization History  Administered Date(s) Administered   Hepatitis B, ADULT 08/27/2015, 11/12/2015, 01/09/2016, 02/09/2016, 07/09/2016, 03/09/2017, 04/20/2017, 05/06/2017, 08/15/2017, 10/09/2018, 11/15/2018, 05/09/2020   Pneumococcal Conjugate-13 03/13/2017   Pneumococcal Polysaccharide-23 03/23/2019   Td 05/12/2005    Past Medical History:  Diagnosis Date   Arthritis    Asthma    as a child   Chronic cystitis    Chronic diastolic heart failure (HCC)    Chronic kidney disease    HD pt, 3 times a week.   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, too chronically sick to do VATS   PVD (peripheral vascular disease) (HCC)    Renal insufficiency    UMBILICAL HERNIA 02/08/2007   Qualifier: History of  By: Peggy Bowens MD, Jimmye Moulds  Venous insufficiency     Tobacco History: Social History   Tobacco Use  Smoking Status Former   Current packs/day: 0.00   Types: Cigarettes   Quit date: 06/06/1979   Years since quitting: 44.9  Smokeless Tobacco Former   Counseling given: Not Answered   Outpatient Medications Prior to Visit  Medication  Sig Dispense Refill   acetaminophen  (TYLENOL ) 325 MG tablet Take 650 mg by mouth every 6 (six) hours as needed for mild pain (pain score 1-3) or fever.     apixaban  (ELIQUIS ) 5 MG TABS tablet Take 1 tablet (5 mg total) by mouth 2 (two) times daily. 60 tablet    aspirin  81 MG chewable tablet Chew 81 mg by mouth daily.     atorvastatin  (LIPITOR ) 80 MG tablet Take 80 mg by mouth at bedtime.     cinacalcet  (SENSIPAR ) 30 MG tablet Take 1 tablet (30 mg total) by mouth every Tuesday, Thursday, and Saturday at 6 PM.     docusate sodium  (COLACE) 100 MG capsule Take 100 mg by mouth daily.     Glucagon, rDNA, (GLUCAGON EMERGENCY) 1 MG KIT Inject 1 mg into the skin as needed (hypoglycemia).     levothyroxine  (SYNTHROID ) 50 MCG tablet Take 50 mcg by mouth daily before breakfast.     midodrine  (PROAMATINE ) 10 MG tablet Take 1 tablet (10 mg total) by mouth 3 (three) times daily with meals.     multivitamin (RENA-VIT) TABS tablet Take 1 tablet by mouth daily.     sucroferric oxyhydroxide (VELPHORO ) 500 MG chewable tablet Chew 1,000 mg by mouth 3 (three) times daily with meals.     No facility-administered medications prior to visit.   Review of Systems  Review of Systems  Constitutional:  Negative for fever.  Respiratory:  Positive for shortness of breath. Negative for cough and wheezing.   Cardiovascular:  Positive for leg swelling.   Physical Exam  Pulse 68   Ht 5\' 10"  (1.778 m)   Wt 156 lb 8.4 oz (71 kg)   SpO2 98%   BMI 22.46 kg/m  Physical Exam Constitutional:      General: He is not in acute distress.    Appearance: He is well-developed. He is not ill-appearing.  HENT:     Head: Normocephalic and atraumatic.  Cardiovascular:     Rate and Rhythm: Normal rate and regular rhythm.     Comments: Trace BLE edema Pulmonary:     Effort: Pulmonary effort is normal. No tachypnea, accessory muscle usage or respiratory distress.     Breath sounds: No stridor. Examination of the right-middle field  reveals decreased breath sounds. Examination of the right-lower field reveals decreased breath sounds. Decreased breath sounds present. No wheezing, rhonchi or rales.  Musculoskeletal:        General: Normal range of motion.     Comments: In WC  Skin:    General: Skin is warm and dry.     Comments: LE discolored  Neurological:     General: No focal deficit present.     Mental Status: He is alert.  Psychiatric:        Mood and Affect: Mood normal.        Behavior: Behavior normal.      Lab Results:  CBC    Component Value Date/Time   WBC 7.3 05/06/2024 0225   RBC 2.76 (L) 05/06/2024 0225   HGB 8.2 (L) 05/06/2024 0225   HGB 11.8 (L) 03/03/2017 1635   HGB 9.4 (L) 07/03/2014  7829   HCT 26.1 (L) 05/06/2024 0225   HCT 26.6 (L) 03/22/2017 1207   HCT 29.0 (L) 07/03/2014 0812   PLT 135 (L) 05/06/2024 0225   PLT 170 03/03/2017 1635   MCV 94.6 05/06/2024 0225   MCV 91 03/03/2017 1635   MCV 86.1 07/03/2014 0812   MCH 29.7 05/06/2024 0225   MCHC 31.4 05/06/2024 0225   RDW 16.0 (H) 05/06/2024 0225   RDW 16.9 (H) 03/03/2017 1635   RDW 16.3 (H) 07/03/2014 0812   LYMPHSABS 0.9 05/01/2024 0801   LYMPHSABS 1.5 07/03/2014 0812   MONOABS 0.5 05/01/2024 0801   MONOABS 0.7 07/03/2014 0812   EOSABS 0.1 05/01/2024 0801   EOSABS 0.6 (H) 07/03/2014 0812   BASOSABS 0.0 05/01/2024 0801   BASOSABS 0.1 07/03/2014 0812    BMET    Component Value Date/Time   NA 134 (L) 05/07/2024 0219   NA 138 03/03/2017 1635   NA 142 07/03/2014 0814   K 4.4 05/07/2024 0219   K 4.4 07/03/2014 0814   CL 97 (L) 05/07/2024 0219   CO2 26 05/07/2024 0219   CO2 20 (L) 07/03/2014 0814   GLUCOSE 94 05/07/2024 0219   GLUCOSE 136 07/03/2014 0814   BUN 39 (H) 05/07/2024 0219   BUN 36 (H) 03/03/2017 1635   BUN 93.6 (H) 07/03/2014 0814   CREATININE 9.18 (H) 05/07/2024 0219   CREATININE 7.3 (HH) 07/03/2014 0814   CALCIUM  8.6 (L) 05/07/2024 0219   CALCIUM  8.6 07/03/2014 0814   GFRNONAA 5 (L) 05/07/2024 0219    GFRNONAA 31 (L) 08/16/2012 1517   GFRAA 6 (L) 09/12/2018 0443   GFRAA 36 (L) 08/16/2012 1517    BNP    Component Value Date/Time   BNP 361.7 (H) 06/23/2015 1620    ProBNP    Component Value Date/Time   PROBNP 1,074.0 (H) 03/07/2014 1110    Imaging: DG Chest 2 View Result Date: 05/16/2024 CLINICAL DATA:  Pleural effusion. EXAM: CHEST - 2 VIEW COMPARISON:  May 01, 2024. FINDINGS: Stable cardiomediastinal silhouette. Moderate size right pleural effusion is noted with associated atelectasis or infiltrate. Minimal left basilar subsegmental atelectasis is noted with minimal left pleural effusion. Bony thorax is unremarkable. IMPRESSION: Moderate size right pleural effusion with associated atelectasis or infiltrate. Electronically Signed   By: Rosalene Colon M.D.   On: 05/16/2024 11:28   DG CHEST PORT 1 VIEW Result Date: 05/01/2024 CLINICAL DATA:  Check central line placed EXAM: PORTABLE CHEST 1 VIEW COMPARISON:  Film from earlier in the same day. FINDINGS: Check shadow is stable. Endotracheal tube, gastric catheter and right jugular central line seen. Jugular central line tip is noted at the cavoatrial junction. No pneumothorax is noted. Persistent right-sided pleural effusion and right basilar airspace opacity is noted. Left lung remains clear. IMPRESSION: No pneumothorax following central line placement. Electronically Signed   By: Violeta Grey M.D.   On: 05/01/2024 22:09   ECHOCARDIOGRAM LIMITED Result Date: 05/01/2024    ECHOCARDIOGRAM LIMITED REPORT   Patient Name:   TISHAWN FRIEDHOFF Date of Exam: 05/01/2024 Medical Rec #:  [id number]       Height:       70.0 in Accession #:    [id number]      Weight:       158.5 lb Date of Birth:  Oct 20, 1951       BSA:          1.891 m Patient Age:    43 years  BP:           146/73 mmHg Patient Gender: M               HR:           63 bpm. Exam Location:  Inpatient Procedure: Limited Echo, Limited Color Doppler and Cardiac Doppler (Both             Spectral and Color Flow Doppler were utilized during procedure). Indications:    Cardiomegaly  History:        Patient has prior history of Echocardiogram examinations, most                 recent 04/06/2024. CHF; Risk Factors:Diabetes and Hypertension.                 CKD.  Sonographer:    Aldon Ambrosia Referring Phys: [id number] Tino Foreman SMITH IMPRESSIONS  1. Septal hypokinesis. Left ventricular ejection fraction, by estimation, is 55 to 60%. The left ventricle has normal function. The left ventricle demonstrates regional wall motion abnormalities (see scoring diagram/findings for description).  2. Right ventricular systolic function is normal. The right ventricular size is normal. There is normal pulmonary artery systolic pressure.  3. The mitral valve is normal in structure. No evidence of mitral valve regurgitation. No evidence of mitral stenosis.  4. The aortic valve is calcified. There is moderate calcification of the aortic valve. There is moderate thickening of the aortic valve. Aortic valve regurgitation is not visualized. Moderate aortic valve stenosis. Aortic valve area, by VTI measures 1.30 cm. Aortic valve mean gradient measures 5.0 mmHg. Aortic valve Vmax measures 1.61 m/s.  5. The inferior vena cava is dilated in size with <50% respiratory variability, suggesting right atrial pressure of 15 mmHg. FINDINGS  Left Ventricle: Septal hypokinesis. Left ventricular ejection fraction, by estimation, is 55 to 60%. The left ventricle has normal function. The left ventricle demonstrates regional wall motion abnormalities. The left ventricular internal cavity size was normal in size. There is no left ventricular hypertrophy.  LV Wall Scoring: The entire septum is hypokinetic. The entire anterior wall, entire lateral wall, entire inferior wall, and apex are normal. Right Ventricle: The right ventricular size is normal. No increase in right ventricular wall thickness. Right ventricular systolic function is normal.  There is normal pulmonary artery systolic pressure. The tricuspid regurgitant velocity is 1.71 m/s, and  with an assumed right atrial pressure of 15 mmHg, the estimated right ventricular systolic pressure is 26.7 mmHg. Left [facility]: Left atrial size was normal in size. Right [facility]: Right atrial size was normal in size. Pericardium: There is no evidence of pericardial effusion. Mitral Valve: The mitral valve is normal in structure. No evidence of mitral valve stenosis. Tricuspid Valve: The tricuspid valve is normal in structure. Tricuspid valve regurgitation is not demonstrated. No evidence of tricuspid stenosis. Aortic Valve: The aortic valve is calcified. There is moderate calcification of the aortic valve. There is moderate thickening of the aortic valve. Aortic valve regurgitation is not visualized. Moderate aortic stenosis is present. Aortic valve mean gradient measures 5.0 mmHg. Aortic valve peak gradient measures 10.4 mmHg. Aortic valve area, by VTI measures 1.30 cm. Pulmonic Valve: The pulmonic valve was normal in structure. Pulmonic valve regurgitation is not visualized. No evidence of pulmonic stenosis. Aorta: The aortic root is normal in size and structure. Venous: The inferior vena cava is dilated in size with less than 50% respiratory variability, suggesting right atrial pressure of 15 mmHg. IAS/Shunts: No atrial  level shunt detected by color flow Doppler. LEFT VENTRICLE PLAX 2D LVIDd:         4.30 cm LVIDs:         3.10 cm LV PW:         0.90 cm LV IVS:        0.80 cm LVOT diam:     1.80 cm LV SV:         37 LV SV Index:   19 LVOT Area:     2.54 cm  LV Volumes (MOD) LV vol d, MOD A2C: 71.4 ml LV vol d, MOD A4C: 123.0 ml LV vol s, MOD A2C: 25.5 ml LV vol s, MOD A4C: 38.5 ml LV SV MOD A2C:     45.9 ml LV SV MOD A4C:     123.0 ml LV SV MOD BP:      67.3 ml RIGHT VENTRICLE            IVC RV S prime:     6.53 cm/s  IVC diam: 2.00 cm TAPSE (M-mode): 1.8 cm AORTIC VALVE AV Area (Vmax):    1.45 cm AV Area  (Vmean):   1.23 cm AV Area (VTI):     1.30 cm AV Vmax:           161.00 cm/s AV Vmean:          98.200 cm/s AV VTI:            0.282 m AV Peak Grad:      10.4 mmHg AV Mean Grad:      5.0 mmHg LVOT Vmax:         91.80 cm/s LVOT Vmean:        47.300 cm/s LVOT VTI:          0.144 m LVOT/AV VTI ratio: 0.51 TRICUSPID VALVE TR Peak grad:   11.7 mmHg TR Vmax:        171.00 cm/s  SHUNTS Systemic VTI:  0.14 m Systemic Diam: 1.80 cm Maudine Sos MD Electronically signed by Maudine Sos MD Signature Date/Time: 05/01/2024/2:32:54 PM    Final    EEG adult Result Date: 05/01/2024 Arleene Lack, MD     05/01/2024  4:16 PM Patient Name: AMATO SEVILLANO MRN: [id number] Epilepsy Attending: Arleene Lack Referring Physician/Provider: Josiah Nigh, MD Date: 05/01/2024 Duration: 23.08 mins Patient history: 73yo M with ams. EEG to evaluate for seizure Level of alertness: Awake/lethargic AEDs during EEG study: None Technical aspects: This EEG study was done with scalp electrodes positioned according to the 10-20 International system of electrode placement. Electrical activity was reviewed with band pass filter of 1-70Hz , sensitivity of 7 uV/mm, display speed of 59mm/sec with a 60Hz  notched filter applied as appropriate. EEG data were recorded continuously and digitally stored.  Video monitoring was available and reviewed as appropriate. Description: EEG showed continuous generalized 3 to 6 Hz theta-delta slowing. Hyperventilation and photic stimulation were not performed.   ABNORMALITY - Continuous slow, generalized IMPRESSION: This study is suggestive of moderate diffuse encephalopathy. No seizures or epileptiform discharges were seen throughout the recording. Arleene Lack   DG Abdomen 1 View Result Date: 05/01/2024 CLINICAL DATA:  OG tube placement. EXAM: ABDOMEN - 1 VIEW COMPARISON:  None Available. FINDINGS: Tip and side port of the enteric tube below the diaphragm in the stomach. Air-filled loops of bowel in  the central abdomen may be gaseous transverse colon. Formed stool in the left colon. IMPRESSION: Tip and side port of the  enteric tube below the diaphragm in the stomach. Electronically Signed   By: Chadwick Colonel M.D.   On: 05/01/2024 10:13   DG Chest Portable 1 View Result Date: 05/01/2024 CLINICAL DATA:  Altered mental status, intubated, hypoxia EXAM: PORTABLE CHEST 1 VIEW COMPARISON:  04/15/2024 05/02/2019 FINDINGS: Endotracheal tube 4.1 cm above the carina. NG tube extends below the hemidiaphragm into the stomach, incompletely imaged. Stable left lung aeration with a left basilar calcified pleural plaque. Persistent loculated moderate right pleural effusion and associated right mid and lower lung collapse/consolidation. No significant interval change. No large pneumothorax. Aorta atherosclerotic. Degenerative changes of the spine and left shoulder IMPRESSION: 1. Endotracheal tube 4.1 cm above the carina. 2. Persistent loculated right pleural effusion and associated right mid and lower lung collapse/consolidation. Electronically Signed   By: Melven Stable.  Shick M.D.   On: 05/01/2024 09:27   CT Head Wo Contrast Result Date: 05/01/2024 CLINICAL DATA:  Mental status change with unknown cause EXAM: CT HEAD WITHOUT CONTRAST TECHNIQUE: Contiguous axial images were obtained from the base of the skull through the vertex without intravenous contrast. RADIATION DOSE REDUCTION: This exam was performed according to the departmental dose-optimization program which includes automated exposure control, adjustment of the mA and/or kV according to patient size and/or use of iterative reconstruction technique. COMPARISON:  Head CT 04/05/2024 FINDINGS: Brain: No evidence of acute infarction, hemorrhage, hydrocephalus, extra-axial collection or mass lesion/mass effect. Chronic small vessel ischemia which is extensive in the cerebral white matter with chronic lacunar infarct at the genu of the left internal capsule and right upper  brainstem. Generalized volume loss. Vascular: No hyperdense vessel or unexpected calcification. Skull: Normal. Negative for fracture or focal lesion. Sinuses/Orbits: No acute finding. IMPRESSION: No acute finding. Chronic small vessel ischemia. Electronically Signed   By: Ronnette Coke M.D.   On: 05/01/2024 09:23     Assessment & Plan:   No problem-specific Assessment & Plan notes found for this encounter.  Assessment and Plan    Chronic right pleural effusion Persistent moderate right pleural effusion, patient reports shortness of breath symptoms since hospitalization which is unchanged. No respiratory distress at rest. O2 98% RA. Chest imaging today shows moderate effusion with possible atelectasis or infiltrate. No clinical suspicion for pneumonia, patient is afebrile and denies chest pain or cough. High surgical risk due to frailty and lung trapping. Not a candidate for pleurodesis or pleuroscopy. Discussed case with Dr. Diania Fortes who reviewed imaging independently. Effusion felt to be chronic, not a surgical candidate. Not much to be done from pulmonary standpoint.   - Monitoring recommended unless symptoms worsen - Follow up in 4-8 weeks.   Acute respiratory failure with hypercapnia Acute respiratory failure with hypercapnia, previously requiring intubation and BiPAP. Patient had tendency to refuse CPAP at night while admitted, he was supposed to be discharged with NIV but does not appear to be using. He does not think he would wear CPAP/BIPAP. Reviewed that noncompliance with PAP therapy could lead to worsening hypercapnia, somnolence, and respiratory failure. - Educate on importance of CPAP/BIPAP compliance to prevent hypercapnic respiratory failure.  Sepsis with encephalopathy and septic shock due to E. Coli UTI Previously hospitalized for sepsis with encephalopathy and septic shock secondary to E. Coli UTI, treated with ceftriaxone . No current signs of infection or fever. No confusion  reported.  Chronic kidney disease, stage 5, on dialysis Stage 5 chronic kidney disease on dialysis. No urine output. Reports no improvement in breathing post-dialysis. No significant leg swelling reported. - Continue dialysis as  per nephrology.  History of atrial fibrillation on anticoagulation On Eliquis  for atrial fibrillation. Previous bleeding at chest tube site managed with pressure dressing and temporary discontinuation of anticoagulation. Anticoagulation resumed without further issues. - Continue Eliquis  for atrial fibrillation.   Antonio Baumgarten, NP 05/16/2024

## 2024-05-18 ENCOUNTER — Emergency Department (HOSPITAL_COMMUNITY)

## 2024-05-18 ENCOUNTER — Inpatient Hospital Stay (HOSPITAL_COMMUNITY)
Admission: EM | Admit: 2024-05-18 | Discharge: 2024-06-11 | DRG: 070 | Disposition: E | Source: Ambulatory Visit | Attending: Critical Care Medicine | Admitting: Critical Care Medicine

## 2024-05-18 DIAGNOSIS — Z515 Encounter for palliative care: Secondary | ICD-10-CM

## 2024-05-18 DIAGNOSIS — Z8249 Family history of ischemic heart disease and other diseases of the circulatory system: Secondary | ICD-10-CM

## 2024-05-18 DIAGNOSIS — D631 Anemia in chronic kidney disease: Secondary | ICD-10-CM | POA: Diagnosis present

## 2024-05-18 DIAGNOSIS — E785 Hyperlipidemia, unspecified: Secondary | ICD-10-CM | POA: Diagnosis present

## 2024-05-18 DIAGNOSIS — Z87442 Personal history of urinary calculi: Secondary | ICD-10-CM

## 2024-05-18 DIAGNOSIS — I4891 Unspecified atrial fibrillation: Secondary | ICD-10-CM | POA: Diagnosis present

## 2024-05-18 DIAGNOSIS — I872 Venous insufficiency (chronic) (peripheral): Secondary | ICD-10-CM | POA: Diagnosis present

## 2024-05-18 DIAGNOSIS — J9811 Atelectasis: Secondary | ICD-10-CM | POA: Diagnosis present

## 2024-05-18 DIAGNOSIS — J9692 Respiratory failure, unspecified with hypercapnia: Secondary | ICD-10-CM | POA: Diagnosis present

## 2024-05-18 DIAGNOSIS — D472 Monoclonal gammopathy: Secondary | ICD-10-CM | POA: Diagnosis present

## 2024-05-18 DIAGNOSIS — Z9181 History of falling: Secondary | ICD-10-CM

## 2024-05-18 DIAGNOSIS — J90 Pleural effusion, not elsewhere classified: Secondary | ICD-10-CM

## 2024-05-18 DIAGNOSIS — Z992 Dependence on renal dialysis: Secondary | ICD-10-CM | POA: Diagnosis not present

## 2024-05-18 DIAGNOSIS — Z7189 Other specified counseling: Secondary | ICD-10-CM | POA: Diagnosis not present

## 2024-05-18 DIAGNOSIS — G9341 Metabolic encephalopathy: Secondary | ICD-10-CM | POA: Diagnosis present

## 2024-05-18 DIAGNOSIS — E1122 Type 2 diabetes mellitus with diabetic chronic kidney disease: Secondary | ICD-10-CM | POA: Diagnosis present

## 2024-05-18 DIAGNOSIS — D696 Thrombocytopenia, unspecified: Secondary | ICD-10-CM | POA: Diagnosis present

## 2024-05-18 DIAGNOSIS — I132 Hypertensive heart and chronic kidney disease with heart failure and with stage 5 chronic kidney disease, or end stage renal disease: Secondary | ICD-10-CM | POA: Diagnosis present

## 2024-05-18 DIAGNOSIS — Z993 Dependence on wheelchair: Secondary | ICD-10-CM

## 2024-05-18 DIAGNOSIS — J9622 Acute and chronic respiratory failure with hypercapnia: Secondary | ICD-10-CM | POA: Diagnosis present

## 2024-05-18 DIAGNOSIS — N186 End stage renal disease: Secondary | ICD-10-CM | POA: Diagnosis present

## 2024-05-18 DIAGNOSIS — E43 Unspecified severe protein-calorie malnutrition: Secondary | ICD-10-CM | POA: Diagnosis present

## 2024-05-18 DIAGNOSIS — R569 Unspecified convulsions: Secondary | ICD-10-CM | POA: Diagnosis not present

## 2024-05-18 DIAGNOSIS — Z66 Do not resuscitate: Secondary | ICD-10-CM | POA: Diagnosis present

## 2024-05-18 DIAGNOSIS — Z7901 Long term (current) use of anticoagulants: Secondary | ICD-10-CM

## 2024-05-18 DIAGNOSIS — K219 Gastro-esophageal reflux disease without esophagitis: Secondary | ICD-10-CM | POA: Diagnosis present

## 2024-05-18 DIAGNOSIS — I6503 Occlusion and stenosis of bilateral vertebral arteries: Secondary | ICD-10-CM

## 2024-05-18 DIAGNOSIS — I6523 Occlusion and stenosis of bilateral carotid arteries: Secondary | ICD-10-CM | POA: Diagnosis not present

## 2024-05-18 DIAGNOSIS — E1151 Type 2 diabetes mellitus with diabetic peripheral angiopathy without gangrene: Secondary | ICD-10-CM | POA: Diagnosis present

## 2024-05-18 DIAGNOSIS — Z6822 Body mass index (BMI) 22.0-22.9, adult: Secondary | ICD-10-CM

## 2024-05-18 DIAGNOSIS — J45909 Unspecified asthma, uncomplicated: Secondary | ICD-10-CM | POA: Diagnosis present

## 2024-05-18 DIAGNOSIS — Z833 Family history of diabetes mellitus: Secondary | ICD-10-CM

## 2024-05-18 DIAGNOSIS — J9621 Acute and chronic respiratory failure with hypoxia: Secondary | ICD-10-CM | POA: Diagnosis present

## 2024-05-18 DIAGNOSIS — R680 Hypothermia, not associated with low environmental temperature: Secondary | ICD-10-CM | POA: Diagnosis present

## 2024-05-18 DIAGNOSIS — G9340 Encephalopathy, unspecified: Secondary | ICD-10-CM | POA: Diagnosis not present

## 2024-05-18 DIAGNOSIS — R627 Adult failure to thrive: Secondary | ICD-10-CM | POA: Diagnosis present

## 2024-05-18 DIAGNOSIS — Z91013 Allergy to seafood: Secondary | ICD-10-CM

## 2024-05-18 DIAGNOSIS — E874 Mixed disorder of acid-base balance: Secondary | ICD-10-CM | POA: Diagnosis present

## 2024-05-18 DIAGNOSIS — Z79890 Hormone replacement therapy (postmenopausal): Secondary | ICD-10-CM

## 2024-05-18 DIAGNOSIS — R4182 Altered mental status, unspecified: Secondary | ICD-10-CM

## 2024-05-18 DIAGNOSIS — I5032 Chronic diastolic (congestive) heart failure: Secondary | ICD-10-CM | POA: Diagnosis present

## 2024-05-18 DIAGNOSIS — Z87440 Personal history of urinary (tract) infections: Secondary | ICD-10-CM

## 2024-05-18 DIAGNOSIS — Z8673 Personal history of transient ischemic attack (TIA), and cerebral infarction without residual deficits: Secondary | ICD-10-CM

## 2024-05-18 DIAGNOSIS — R4020 Unspecified coma: Secondary | ICD-10-CM | POA: Diagnosis present

## 2024-05-18 DIAGNOSIS — Z79899 Other long term (current) drug therapy: Secondary | ICD-10-CM

## 2024-05-18 DIAGNOSIS — R5381 Other malaise: Secondary | ICD-10-CM | POA: Diagnosis present

## 2024-05-18 DIAGNOSIS — Z87891 Personal history of nicotine dependence: Secondary | ICD-10-CM

## 2024-05-18 DIAGNOSIS — R4189 Other symptoms and signs involving cognitive functions and awareness: Secondary | ICD-10-CM | POA: Diagnosis not present

## 2024-05-18 DIAGNOSIS — Z7982 Long term (current) use of aspirin: Secondary | ICD-10-CM

## 2024-05-18 DIAGNOSIS — M1990 Unspecified osteoarthritis, unspecified site: Secondary | ICD-10-CM | POA: Diagnosis present

## 2024-05-18 LAB — I-STAT ARTERIAL BLOOD GAS, ED
Acid-Base Excess: 3 mmol/L — ABNORMAL HIGH (ref 0.0–2.0)
Bicarbonate: 33.9 mmol/L — ABNORMAL HIGH (ref 20.0–28.0)
Calcium, Ion: 1.24 mmol/L (ref 1.15–1.40)
HCT: 28 % — ABNORMAL LOW (ref 39.0–52.0)
Hemoglobin: 9.5 g/dL — ABNORMAL LOW (ref 13.0–17.0)
O2 Saturation: 87 %
Potassium: 4.2 mmol/L (ref 3.5–5.1)
Sodium: 136 mmol/L (ref 135–145)
TCO2: 37 mmol/L — ABNORMAL HIGH (ref 22–32)
pCO2 arterial: 104.3 mmHg (ref 32–48)
pH, Arterial: 7.12 — CL (ref 7.35–7.45)
pO2, Arterial: 74 mmHg — ABNORMAL LOW (ref 83–108)

## 2024-05-18 LAB — POCT I-STAT 7, (LYTES, BLD GAS, ICA,H+H)
Acid-Base Excess: 4 mmol/L — ABNORMAL HIGH (ref 0.0–2.0)
Bicarbonate: 33.5 mmol/L — ABNORMAL HIGH (ref 20.0–28.0)
Calcium, Ion: 1.17 mmol/L (ref 1.15–1.40)
HCT: 29 % — ABNORMAL LOW (ref 39.0–52.0)
Hemoglobin: 9.9 g/dL — ABNORMAL LOW (ref 13.0–17.0)
O2 Saturation: 93 %
Patient temperature: 92.2
Potassium: 4.7 mmol/L (ref 3.5–5.1)
Sodium: 137 mmol/L (ref 135–145)
TCO2: 36 mmol/L — ABNORMAL HIGH (ref 22–32)
pCO2 arterial: 72 mmHg (ref 32–48)
pH, Arterial: 7.256 — ABNORMAL LOW (ref 7.35–7.45)
pO2, Arterial: 69 mmHg — ABNORMAL LOW (ref 83–108)

## 2024-05-18 LAB — CBC
HCT: 30.3 % — ABNORMAL LOW (ref 39.0–52.0)
Hemoglobin: 9.1 g/dL — ABNORMAL LOW (ref 13.0–17.0)
MCH: 29.7 pg (ref 26.0–34.0)
MCHC: 30 g/dL (ref 30.0–36.0)
MCV: 99 fL (ref 80.0–100.0)
Platelets: 217 10*3/uL (ref 150–400)
RBC: 3.06 MIL/uL — ABNORMAL LOW (ref 4.22–5.81)
RDW: 15.8 % — ABNORMAL HIGH (ref 11.5–15.5)
WBC: 9.5 10*3/uL (ref 4.0–10.5)
nRBC: 0 % (ref 0.0–0.2)

## 2024-05-18 LAB — COMPREHENSIVE METABOLIC PANEL WITH GFR
ALT: 29 U/L (ref 0–44)
AST: 40 U/L (ref 15–41)
Albumin: 3.1 g/dL — ABNORMAL LOW (ref 3.5–5.0)
Alkaline Phosphatase: 56 U/L (ref 38–126)
Anion gap: 12 (ref 5–15)
BUN: 37 mg/dL — ABNORMAL HIGH (ref 8–23)
CO2: 28 mmol/L (ref 22–32)
Calcium: 8.6 mg/dL — ABNORMAL LOW (ref 8.9–10.3)
Chloride: 98 mmol/L (ref 98–111)
Creatinine, Ser: 8.71 mg/dL — ABNORMAL HIGH (ref 0.61–1.24)
GFR, Estimated: 6 mL/min — ABNORMAL LOW (ref >60)
Glucose, Bld: 101 mg/dL — ABNORMAL HIGH (ref 70–99)
Potassium: 4 mmol/L (ref 3.5–5.1)
Sodium: 138 mmol/L (ref 135–145)
Total Bilirubin: 0.5 mg/dL (ref 0.0–1.2)
Total Protein: 7.3 g/dL (ref 6.5–8.1)

## 2024-05-18 LAB — I-STAT CHEM 8, ED
BUN: 52 mg/dL — ABNORMAL HIGH (ref 8–23)
Calcium, Ion: 1.06 mmol/L — ABNORMAL LOW (ref 1.15–1.40)
Chloride: 97 mmol/L — ABNORMAL LOW (ref 98–111)
Creatinine, Ser: 8 mg/dL — ABNORMAL HIGH (ref 0.61–1.24)
Glucose, Bld: 99 mg/dL (ref 70–99)
HCT: 31 % — ABNORMAL LOW (ref 39.0–52.0)
Hemoglobin: 10.5 g/dL — ABNORMAL LOW (ref 13.0–17.0)
Potassium: 4.5 mmol/L (ref 3.5–5.1)
Sodium: 137 mmol/L (ref 135–145)
TCO2: 33 mmol/L — ABNORMAL HIGH (ref 22–32)

## 2024-05-18 LAB — I-STAT VENOUS BLOOD GAS, ED
Acid-Base Excess: 1 mmol/L (ref 0.0–2.0)
Bicarbonate: 33.1 mmol/L — ABNORMAL HIGH (ref 20.0–28.0)
Calcium, Ion: 1.15 mmol/L (ref 1.15–1.40)
HCT: 31 % — ABNORMAL LOW (ref 39.0–52.0)
Hemoglobin: 10.5 g/dL — ABNORMAL LOW (ref 13.0–17.0)
O2 Saturation: 98 %
Potassium: 3.9 mmol/L (ref 3.5–5.1)
Sodium: 139 mmol/L (ref 135–145)
TCO2: 36 mmol/L — ABNORMAL HIGH (ref 22–32)
pCO2, Ven: 109.4 mmHg (ref 44–60)
pH, Ven: 7.089 — CL (ref 7.25–7.43)
pO2, Ven: 150 mmHg — ABNORMAL HIGH (ref 32–45)

## 2024-05-18 LAB — DIFFERENTIAL
Abs Immature Granulocytes: 0.04 10*3/uL (ref 0.00–0.07)
Basophils Absolute: 0.1 10*3/uL (ref 0.0–0.1)
Basophils Relative: 1 %
Eosinophils Absolute: 0.1 10*3/uL (ref 0.0–0.5)
Eosinophils Relative: 1 %
Immature Granulocytes: 0 %
Lymphocytes Relative: 10 %
Lymphs Abs: 0.9 10*3/uL (ref 0.7–4.0)
Monocytes Absolute: 1.1 10*3/uL — ABNORMAL HIGH (ref 0.1–1.0)
Monocytes Relative: 11 %
Neutro Abs: 7.4 10*3/uL (ref 1.7–7.7)
Neutrophils Relative %: 77 %

## 2024-05-18 LAB — PROTIME-INR
INR: 1.7 — ABNORMAL HIGH (ref 0.8–1.2)
Prothrombin Time: 20.2 s — ABNORMAL HIGH (ref 11.4–15.2)

## 2024-05-18 LAB — I-STAT CG4 LACTIC ACID, ED
Lactic Acid, Venous: 1.2 mmol/L (ref 0.5–1.9)
Lactic Acid, Venous: 1.2 mmol/L (ref 0.5–1.9)

## 2024-05-18 LAB — GLUCOSE, CAPILLARY: Glucose-Capillary: 92 mg/dL (ref 70–99)

## 2024-05-18 LAB — APTT: aPTT: 60 s — ABNORMAL HIGH (ref 24–36)

## 2024-05-18 LAB — ETHANOL: Alcohol, Ethyl (B): <15 mg/dL (ref <15)

## 2024-05-18 MED ORDER — CHLORHEXIDINE GLUCONATE CLOTH 2 % EX PADS
6.0000 | MEDICATED_PAD | Freq: Every day | CUTANEOUS | Status: DC
  Administered 2024-05-18: 6 via TOPICAL

## 2024-05-18 MED ORDER — ORAL CARE MOUTH RINSE: 15.0000 mL | OROMUCOSAL | Status: DC | PRN

## 2024-05-18 MED ORDER — LEVETIRACETAM (KEPPRA) 500 MG/5 ML ADULT IV PUSH
60.0000 mg/kg | Freq: Once | INTRAVENOUS | Status: AC
  Administered 2024-05-18: 4250 mg via INTRAVENOUS
  Filled 2024-05-18: qty 45

## 2024-05-18 MED ORDER — HEPARIN SODIUM (PORCINE) 5000 UNIT/ML IJ SOLN
5000.0000 [IU] | Freq: Three times a day (TID) | INTRAMUSCULAR | Status: DC
  Administered 2024-05-18 – 2024-05-19 (×3): 5000 [IU] via SUBCUTANEOUS
  Filled 2024-05-18 (×3): qty 1

## 2024-05-18 MED ORDER — LACTATED RINGERS IV BOLUS
500.0000 mL | Freq: Once | INTRAVENOUS | Status: AC
  Administered 2024-05-18: 500 mL via INTRAVENOUS

## 2024-05-18 MED ORDER — SODIUM CHLORIDE 0.9% FLUSH
3.0000 mL | Freq: Once | INTRAVENOUS | Status: AC
  Administered 2024-05-18: 3 mL via INTRAVENOUS

## 2024-05-18 MED ORDER — ORAL CARE MOUTH RINSE
15.0000 mL | OROMUCOSAL | Status: DC
  Administered 2024-05-18: 15 mL via OROMUCOSAL

## 2024-05-18 MED ORDER — IOHEXOL 350 MG/ML SOLN
75.0000 mL | Freq: Once | INTRAVENOUS | Status: AC | PRN
  Administered 2024-05-18: 75 mL via INTRAVENOUS

## 2024-05-18 MED ORDER — PIPERACILLIN-TAZOBACTAM IN DEX 2-0.25 GM/50ML IV SOLN
2.2500 g | Freq: Three times a day (TID) | INTRAVENOUS | Status: DC
  Administered 2024-05-19 (×2): 2.25 g via INTRAVENOUS
  Filled 2024-05-18 (×4): qty 50

## 2024-05-18 MED ORDER — VANCOMYCIN HCL 1500 MG/300ML IV SOLN
1500.0000 mg | Freq: Once | INTRAVENOUS | Status: AC
  Administered 2024-05-18: 1500 mg via INTRAVENOUS
  Filled 2024-05-18: qty 300

## 2024-05-18 MED ORDER — PIPERACILLIN-TAZOBACTAM 3.375 G IVPB 30 MIN
3.3750 g | Freq: Once | INTRAVENOUS | Status: DC
  Filled 2024-05-18: qty 50

## 2024-05-18 NOTE — Progress Notes (Signed)
 Pt transported on BIPAP from ED 16 to 2H13 without complications.

## 2024-05-18 NOTE — Code Documentation (Addendum)
 Stroke Response Nurse Documentation Code Documentation  [name] is a 73 y.o. male arriving to [facility]  via [facility] on 05/18/2024 with past medical hx of HTN, CHF, diabetes, ESRD on HD. On aspirin  81 mg daily and Eliquis  (apixaban ) daily. Code stroke was activated by EMS.   [facility] at [phone] and now complaining of unable to follow commands. Patient began his hemodialysis treatment at 72. At 0645, staff noted he became unresponsive.   Stroke team at the bedside on patient arrival. Labs drawn and patient cleared for CT by Dr. Monnie Anthony. Patient to CT with team.   NIHSS 30, see documentation for details and code stroke times. Patient with decreased LOC, disoriented, not following commands, right gaze preference , bilateral hemianopia, bilateral arm weakness, bilateral (R>L) leg weakness, Global aphasia , and dysarthria  on exam.   The following imaging was completed:  CT Head and CTA.  Patient is not a candidate for IV Thrombolytic due to being on Eliquis . Patient is not a candidate for IR due to imaging negative for LVO.   Care Plan: q2h NIHSS and VS.   Process Delays Noted: Upon arrival, concern for airway protection. Decision made to proceed to CT, VS monitored and stable during CT.   Bedside handoff with ED RN Josh.    Bird Swetz L Florentine Diekman  Rapid Response RN

## 2024-05-18 NOTE — ED Notes (Signed)
 PT noted to be hypotensive.  EDP aware.  LR 500 mL bolus ordered and administered.

## 2024-05-18 NOTE — Progress Notes (Signed)
 PCCM progress note  As discussed in full consult note dictated by Dr. Bertrum Brodie patient was reevaluated while on BiPAP.  Hypercapnia has minimally improved with pCO2 109 > 104 but patient is slight more responsive responsiveness.  Long discussion was held at bedside with patient's brother and significant other regarding risk and benefits of continued BiPAP therapy.  Given slight improvement in pCO2 and mentation [name] has decided to continue current BiPAP for 12 hours and if patient has not drastically improved in that amount of time decision will be made to transition toward comfort care.  Given severe hypercapnia with new BiPAP therapy will admit patient to ICU for close monitoring.  Admitting team aware and agreeable for admission to ICU.   Anabelle Bungert D. Harris, NP-C [facility] Personal contact information can be found on Amion  If no contact or response made please call [phone] 05/18/2024, 2:38 PM

## 2024-05-18 NOTE — ED Notes (Signed)
 Pt bed is being changed to 2H per charge nurse at [facility]

## 2024-05-18 NOTE — Progress Notes (Signed)
 EEG equipment transferred to 2H13 with patient. [facility] now monitoring.

## 2024-05-18 NOTE — Progress Notes (Signed)
   05/18/24 0928  BiPAP/CPAP/SIPAP  $ Non-Invasive Ventilator  Non-Invasive Vent Set Up;Non-Invasive Vent Initial  $ Face Mask Medium Yes  BiPAP/CPAP/SIPAP Pt Type Adult  BiPAP/CPAP/SIPAP SERVO  Mask Type Full face mask  Mask Size Medium  Set Rate 28 breaths/min  Respiratory Rate 31 breaths/min  IPAP 18 cmH20  EPAP 8 cmH2O  Pressure Support 10 cmH20  PEEP 8 cmH20  FiO2 (%) 40 %  Leak 86  Peak Inspiratory Pressure (PIP) 17  Tidal Volume (Vt) (S)  215 (MD AWARE)  Patient Home Machine No  Patient Home Mask No  Patient Home Tubing No  Auto Titrate No  Press High Alarm 25 cmH2O  Press Low Alarm 5 cmH2O  Device Plugged into RED Power Outlet Yes  BiPAP/CPAP /SiPAP Vitals  Pulse Rate 82  Resp (!) 31  Bilateral Breath Sounds Diminished  MEWS Score/Color  MEWS Score 2  MEWS Score Color Yellow      Pt placed on BIPAP 18/8 on 40% per MD order. RT will monitor.

## 2024-05-18 NOTE — ED Notes (Signed)
 Pt moaned and moved limbs a little when ABG was being drawn, otherwise responsiveness has not changed since arrival.

## 2024-05-18 NOTE — Procedures (Signed)
 HD needles were removed.  Patient admitted with them in place.  No issues with removal. Thrill not felt at time of removal.

## 2024-05-18 NOTE — Progress Notes (Signed)
   05/18/24 1926  BiPAP/CPAP/SIPAP  BiPAP/CPAP/SIPAP Pt Type Adult  BiPAP/CPAP/SIPAP SERVO  Mask Type Full face mask  Mask Size Medium  Set Rate 28 breaths/min  Respiratory Rate 28 breaths/min  IPAP 18 cmH20  EPAP 8 cmH2O  PEEP 8 cmH20  FiO2 (%) 40 %  Leak 72  Peak Inspiratory Pressure (PIP) 17  Tidal Volume (Vt) 227  Patient Home Machine No  Patient Home Mask No  Patient Home Tubing No  Auto Titrate No  Press High Alarm 25 cmH2O  Press Low Alarm 5 cmH2O  Device Plugged into RED Power Outlet Yes  BiPAP/CPAP /SiPAP Vitals  Pulse Rate (!) 45  Resp (!) 24  SpO2 99 %  Bilateral Breath Sounds Diminished

## 2024-05-18 NOTE — ED Notes (Signed)
 Report attempted.  Secretary stated RN was in room and would call back.  She took my number.

## 2024-05-18 NOTE — ED Notes (Signed)
CBG 110. 

## 2024-05-18 NOTE — ED Notes (Signed)
CCMD notified

## 2024-05-18 NOTE — H&P (Cosign Needed Addendum)
 Date: 05/18/2024               Patient Name:  [name] MRN: [id number]  DOB: [birth year] Age / Sex: 73 y.o., male   PCP: Shannan Dart., [name]         Medical Service: Internal Medicine Teaching Service         Attending Physician: Dr. Bevelyn Bryant, MD      First Contact: Dr. Lanney Pitts, DO Pager [phone]    Second Contact: Dr. Malen Scudder, DO          After Hours (After 5p/  First Contact Pager: [phone]  weekends / holidays): Second Contact Pager: [phone]   SUBJECTIVE   Chief Complaint: Altered mental status  History of Present Illness:   This is a 73 year old male with pertinent past medical history of CHF, HTN, type 2 diabetes, chronic right pleural effusion, acute respiratory failure, hyperparathyroidism, ESRD on HD, thrombocytopenia, anemia of chronic disease, obesity, MGUS, hyperlipidemia, wheelchair-bound at baseline brought by EMS for altered mental status at dialysis, where patient became nonresponsive.  At bedside, patient is non-responsive on BiPAP, does not respond to painful stimuli, minimal response of pupils.   Called patient's brother, [name], who reported that from previous hospitalization, patient's wishes were against CPR, Shock and intubation. Patient's brother is unsure whether he wants to pursue comfort care or aggressive care. [name] reported that he will come to the hospital soon to have GOC conversation.   Of note, recently admitted on 5/21 for acute metabolic encephalopathy, hypercarbic respiratory failure, E. coli UTI requiring brief ICU stay with intubation, ventilatory support and pressors.  Discharged on 05/07/2024.  ED Course: - Code stroke initiated. - CT head without con negative for acute stroke, showed chronic small vessel disease. CTA H/N negative for LVO but noted severe calcified atherosclerosis throughout head and neck. - Neurology on board. - VBG with pH 7.089/pCO2 109.4/pO2 150/bicarb 33.1  Past Medical History: Past Medical  History:  Diagnosis Date   Arthritis    Asthma    as a child   Chronic cystitis    Chronic diastolic heart failure (HCC)    Chronic kidney disease    HD pt, 3 times a week.   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, too chronically sick to do VATS   PVD (peripheral vascular disease) (HCC)    Renal insufficiency    UMBILICAL HERNIA 02/08/2007   Qualifier: History of  By: Peggy Bowens MD, Shane     Venous insufficiency     Meds:  Current Outpatient Medications  Medication Instructions   acetaminophen  (TYLENOL ) 650 mg, Every 6 hours PRN   apixaban  (ELIQUIS ) 5 mg, Oral, 2 times daily   aspirin  81 mg, Daily   atorvastatin  (LIPITOR ) 80 mg, Daily at bedtime   cinacalcet  (SENSIPAR ) 30 mg, Oral, Every T-Th-Sa (1800)   docusate sodium  (COLACE) 100 mg, Daily   doxycycline  (VIBRAMYCIN ) 100 mg, 2 times daily   Glucagon Emergency 1 mg, Subcutaneous, As needed   levothyroxine  (SYNTHROID ) 50 mcg, Daily before breakfast   midodrine  (PROAMATINE ) 10  mg, Oral, 3 times daily with meals   multivitamin (RENA-VIT) TABS tablet 1 tablet, Oral, Daily   Velphoro  1,000 mg, 3 times daily with meals    Past Surgical History:  Procedure Laterality Date   A/V FISTULAGRAM N/A 02/02/2024   Procedure: A/V Fistulagram;  Surgeon: Baron Border, MD;  Location: [facility] INVASIVE CV LAB;  Service: Cardiovascular;  Laterality: N/A;   ABDOMINAL AORTOGRAM W/LOWER EXTREMITY N/A 05/19/2017   Procedure: Abdominal Aortogram w/Lower Extremity;  Surgeon: Richrd Char, MD;  Location: [facility] INVASIVE CV LAB;  Service: Cardiovascular;  Laterality: N/A;   ABDOMINAL AORTOGRAM W/LOWER EXTREMITY  N/A 06/18/2021   Procedure: ABDOMINAL AORTOGRAM W/LOWER EXTREMITY;  Surgeon: Richrd Char, MD;  Location: [facility] INVASIVE CV LAB;  Service: Cardiovascular;  Laterality: N/A;   ABDOMINAL AORTOGRAM W/LOWER EXTREMITY N/A 04/17/2023   Procedure: ABDOMINAL AORTOGRAM W/LOWER EXTREMITY;  Surgeon: Adine Hoof, MD;  Location: [facility] INVASIVE CV LAB;  Service: Cardiovascular;  Laterality: N/A;   ABDOMINAL AORTOGRAM W/LOWER EXTREMITY N/A 05/22/2023   Procedure: ABDOMINAL AORTOGRAM W/LOWER EXTREMITY;  Surgeon: Adine Hoof, MD;  Location: [facility] INVASIVE CV LAB;  Service: Cardiovascular;  Laterality: N/A;   AMPUTATION     Right and left fifth toes.    AMPUTATION Left 06/18/2021   Procedure: Amputation transmetatarsal of toes 2 3 and 4 left foot ;  Surgeon: Richrd Char, MD;  Location: [facility] OR;  Service: Vascular;  Laterality: Left;   AMPUTATION TOE     emoval of both little toes   AV FISTULA PLACEMENT Left 03/21/2014   Procedure: ARTERIOVENOUS (AV) FISTULA CREATION with ultrasound;  Surgeon: Mayo Speck, MD;  Location: [facility] OR;  Service: Vascular;  Laterality: Left;   COLONOSCOPY     EYE SURGERY Bilateral    cataract and lens implant   FEMORAL-TIBIAL BYPASS GRAFT Left 06/18/2021   Procedure: Left below-knee popliteal to posterior tibial artery bypass with 9 reversed left greater saphenous vein;  Surgeon: Richrd Char, MD;  Location: [facility] OR;  Service: Vascular;  Laterality: Left;   HERNIA REPAIR     IR IMAGE GUIDED FLUID DRAIN BY CATHETER  04/09/2024   IR THORACENTESIS ASP PLEURAL SPACE W/IMG GUIDE  04/05/2024   LIGATION OF COMPETING BRANCHES OF ARTERIOVENOUS FISTULA Left 06/29/2015   Procedure: LIGATION OF LEFT ARM RADIOCEPHALIC ARTERIOVENOUS FISTULA SIDE BRANCHES;  Surgeon: Arvil Lauber, MD;  Location: [facility] OR;  Service: Vascular;  Laterality: Left;   MULTIPLE EXTRACTIONS WITH ALVEOLOPLASTY N/A 04/07/2017   Procedure: Extraction of tooth #'s 1-11, 13, 14,16, 20-23, and 26-28 with  alveoloplasty;  Surgeon: Carol Chroman, DDS;  Location: [facility] OR;  Service: Oral Surgery;  Laterality: N/A;   PERIPHERAL VASCULAR INTERVENTION Right 04/17/2023   Procedure: PERIPHERAL VASCULAR INTERVENTION;  Surgeon: Adine Hoof, MD;  Location: [facility] INVASIVE CV LAB;  Service: Cardiovascular;  Laterality: Right;  Right SFA Stent   PERIPHERAL VASCULAR INTERVENTION Left 05/22/2023   Procedure: PERIPHERAL VASCULAR INTERVENTION;  Surgeon: Adine Hoof, MD;  Location: [facility] INVASIVE CV LAB;  Service: Cardiovascular;  Laterality: Left;  SFA   Popliteal to posterior tibial bypass     2006   R knee arthoscopic repair of meniscus     UMBILICAL HERNIA REPAIR      Social Per chart review: Living in [facility], divorced, with partner Udall PCP: Shannan Dart., [name]   Family History: noncontributory  Allergies: Allergies as of 05/18/2024 - Unable to Assess 05/18/2024  Allergen Reaction Noted   Shellfish allergy  Swelling 10/30/2013   Shrimp (diagnostic) Swelling 04/05/2024    Review of Systems: A complete ROS was negative except as per HPI.   OBJECTIVE:   Physical Exam: Blood pressure (!) 148/49, pulse 72, weight 70 kg, SpO2 100% .  General: Ill-appearing, nonresponsive, on BiPAP Cardiovascular: Regular rate Pulmonary: No crackles or wheezing auscultated anteriorly Abdomen: Soft, non distended Neuro: Obtunded, not responding to painful stimuli such as sternal rub, minimal pupillary response bilaterally.   Labs: CBC    Component Value Date/Time   WBC 9.5 05/18/2024 0732   RBC 3.06 (L) 05/18/2024 0732   HGB 10.5 (L) 05/18/2024 0850   HGB 11.8 (L) 03/03/2017 1635   HGB 9.4 (L) 07/03/2014 0812   HCT 31.0 (L) 05/18/2024 0850   HCT 26.6 (L) 03/22/2017 1207   HCT 29.0 (L) 07/03/2014 0812   PLT 217 05/18/2024 0732   PLT 170 03/03/2017 1635   MCV 99.0 05/18/2024 0732   MCV 91 03/03/2017 1635   MCV 86.1 07/03/2014 0812   MCH 29.7 05/18/2024 0732   MCHC 30.0  05/18/2024 0732   RDW 15.8 (H) 05/18/2024 0732   RDW 16.9 (H) 03/03/2017 1635   RDW 16.3 (H) 07/03/2014 0812   LYMPHSABS 0.9 05/18/2024 0732   LYMPHSABS 1.5 07/03/2014 0812   MONOABS 1.1 (H) 05/18/2024 0732   MONOABS 0.7 07/03/2014 0812   EOSABS 0.1 05/18/2024 0732   EOSABS 0.6 (H) 07/03/2014 0812   BASOSABS 0.1 05/18/2024 0732   BASOSABS 0.1 07/03/2014 0812     CMP     Component Value Date/Time   NA 139 05/18/2024 0850   NA 138 03/03/2017 1635   NA 142 07/03/2014 0814   K 3.9 05/18/2024 0850   K 4.4 07/03/2014 0814   CL 97 (L) 05/18/2024 0737   CO2 28 05/18/2024 0732   CO2 20 (L) 07/03/2014 0814   GLUCOSE 99 05/18/2024 0737   GLUCOSE 136 07/03/2014 0814   BUN 52 (H) 05/18/2024 0737   BUN 36 (H) 03/03/2017 1635   BUN 93.6 (H) 07/03/2014 0814   CREATININE 8.00 (H) 05/18/2024 0737   CREATININE 7.3 (HH) 07/03/2014 0814   CALCIUM  8.6 (L) 05/18/2024 0732   CALCIUM  8.6 07/03/2014 0814   PROT 7.3 05/18/2024 0732   PROT 8.0 03/03/2017 1635   PROT 7.3 07/03/2014 0814   ALBUMIN 3.1 (L) 05/18/2024 0732   ALBUMIN 4.0 03/03/2017 1635   ALBUMIN 3.2 (L) 07/03/2014 0814   AST 40 05/18/2024 0732   AST 10 07/03/2014 0814   ALT 29 05/18/2024 0732   ALT 13 07/03/2014 0814   ALKPHOS 56 05/18/2024 0732   ALKPHOS 52 07/03/2014 0814   BILITOT 0.5 05/18/2024 0732   BILITOT 0.4 03/03/2017 1635   BILITOT 0.34 07/03/2014 0814   GFRNONAA 6 (L) 05/18/2024 0732   GFRNONAA 31 (L) 08/16/2012 1517   GFRAA 6 (L) 09/12/2018 0443   GFRAA 36 (L) 08/16/2012 1517    Imaging: CT head, stroke without contrast IMPRESSION: 1. Stable non contrast CT appearance of small vessel disease. No acute cortically based infarct or acute intracranial hemorrhage identified. ASPECTS 10.  CT angio head neck with without contrast IMPRESSION: 1. Negative for large vessel occlusion. 2. But Positive for very severe calcified atherosclerosis throughout the head and neck. RADIOGRAPHIC STRING SIGN STENOSES of the  BILATERAL ICA siphons AND BILATERAL distal Vertebral arteries. Tandem Severe bilateral extracranial vertebral artery stenoses. Right ICA bulb 60% stenosis.  Left ICA bulb 50% stenosis. Aortic Atherosclerosis (ICD10-I70.0). 3. Relative sparing of the circle-of-Willis  branches, but mild to moderate bilateral PCA P1/P2 stenosis. 4. Retained secretions in the subglottic trachea and Patchy peripheral right upper lobe opacity mild ground-glass. Consider developing Bronchopneumonia.  DG chest port 1 view Moderate right pleural effusion with right lung base atelectasis or infiltrate.  EKG: No EKG noted  ASSESSMENT & PLAN:   Assessment & Plan by Problem: Principal Problem:   Acute encephalopathy   RAPHEL STICKLES is a 73 y.o. person living with a history of CHF, HTN, type 2 diabetes, chronic right pleural effusion, acute respiratory failure, hyperparathyroidism, ESRD on HD, thrombocytopenia, anemia of chronic disease, obesity, MGUS, hyperlipidemia, wheelchair-bound at baseline brought by EMS for altered mental status at dialysis, where patient became non responsive admitted for acute hypercarbic respiratory failure/acute encephalopathy.  Acute hypercarbic respiratory failure Chronic right pleural effusion Respiratory acidosis with compensated metabolic alkalosis  Recently admitted on 05/01/2024 for acute metabolic encephalopathy/hypercarbic respiratory failure and sepsis, was intubated and placed on mechanical ventilation. Presents today after he was found non-responsive at the HD session. In the ED, VBG with pH 7.089/ pCO2 109.4/ Bicarb 33.1. Chest x-ray noted moderate right pleural effusion with right lung base atelectasis or infiltrate similar to findings on 6/5 CXR ordered by pulm OV. Per chart review, noncompliance with CPAP/BiPAP is noted. On exam, patient is obtunded, non responsive to painful stimuli. I am concerned about his presentation, high risk for cardiovascular collapse; presently  on BiPAP. PCCM engaged. - Family contacted - primarily patient's brother, [name], who expresses DNR/DNI, would like us  to do a trial of BiPAP for hope in improvement. He does understand the severity of patient's condition. Palliative care services offered, would like to speak to them.  - Appreciate PCCM input  - Patient is obtunded, BiPAP contraindicated.   - Will f/u on ABG  - Anticipate revisiting GOC conversation with family.  - Palliative Care consulted.   Altered mental status Acute encephalopathy Presented with altered mental status, not responsive, code stroke activated in the ED.  CT head without con did not show acute infarct or intracranial hemorrhage.  Did show stable small vessel disease.  CTA H/N without LVO, significant for severe calcific atherosclerosis throughout head and neck with radiographic string sign stenosis of the bilateral ICA siphons and bilateral distal vertebral arteries. Presently on EEG, loaded with IV Keppra.  - Follow Neurology recommendation   Hypotension  Blood pressures dropping in the ED, with most recent SBP 90s likely d/t to above processes. - Agree with 500 cc bolus LR  - Maintain MAP > 65  - Will monitor closely and will evaluate need for pressors per PCCM   ESRD Found to be nonresponsive during HD session today. - If pursuing aggressive care, will need nephrology consult  History of A-fib PTA Eliquis  twice daily, hold for now.  HLD Home medication consist of Lipitor  80 mg daily, will hold.  Diet: NPO VTE: Heparin  IVF: LR,500 cc Code: DNR/DNI  Prior to Admission Living Arrangement: [facility] Anticipated Discharge Location: TBA Barriers to Discharge: Medical management   Dispo: Admit patient to Inpatient with expected length of stay greater than 2 midnights.  Signed: Lanney Pitts, DO Internal Medicine Resident PGY-1  05/18/2024, 9:56 AM

## 2024-05-18 NOTE — Progress Notes (Addendum)
 Neurology stroke code documentation  This is a 73 year old gentleman with ESRD on HD, thrombocytopenia, anemia of chronic disease, MGUS, CHF, hypertension, type 2 diabetes, chronic respiratory failure, wheelchair-bound at baseline, recently discharged from: On May 27 after being admitted with septic shock secondary to E. coli UTI complicated by acute on chronic respiratory failure with hypercapnia requiring ICU admission, intubation, and pressors.  Stroke code was activated by EMS this morning after they were called to dialysis after he became acutely altered and unresponsive.  I was unable to reach anybody at the dialysis center with the main number or with the number that EMS documented called 911 this morning but per EMS facility staff said that he arrived at approximately 5:20 AM for dialysis and then stopped dialysis was stopped at 645 because he was noted to be unresponsive.  They did not tell EMS and what neurologic condition he presented to dialysis this morning although presumably he was at least somewhat responsive at that point.  Last known well (baseline deficits) at some point yesterday. Patient is wheelchair bound at baseline with mRS of 4. Lives by himself but has close friend Erna He who checks on him daily.   EMS reported intermittent desats to 70s en route, required bagging en route 2x, not hypoxic on arrival to ED bridge but concern for not protecting airway 2/2 unresponsiveness and minimal gag. I did feel he was stable enough for STAT noncon head CT therefore proceeded. CT head showed no ICH or acute process on personal review. Patient was sent by dialysis center with a binder that showed form with a checked box that said patient advance directives are FULL CODE.  On my exam not responsive to commands or noxious stimuli, pupils 2mm sluggish but reactive, (-) corneals and oculocephalics, minimal gag, no response to deep sternal rub or noxious stimuli in any extremity.  My exam showed severe  neurologic deficits but was nonfocal. EMS reported that he had R gaze deviation on their arrival so I was concerned that stroke or seizure could have precipitated unresponsiveness. Patient not a candidate for TNK 2/2 unknown last known well and contraindication of being on eliquis  for a fib last dose unknown. CTA H&N showed no LVO but did show extensive severe calcified atherosclerosis throughout the head and neck including radiographic string sign stenoses of bilateral ICA siphons and bilateral distal vertebrals.  Patient did not further decompensate from a respiratory standpoint while in CT but exam did not improve and I remained concerned that we would have to emergently intubate for airway protection.  I called his brother Ralston Venus to confirm code status. Brother and I had a very long phone conversation about what his goals of care should be.  Brother reports that family was not aware that patient was in the hospital last admission until he was already intubated.  He initially said that he thought his brother would want everything done but upon further reflection he stated that his brother told him after he was extubated 2 weeks ago that he never wanted to go through those interventions specifically including intubation again.  He therefore decided that CODE STATUS should be DO NOT RESUSCITATE consistent with patient's most recently expressed wishes.  Very tenuous respiratory status since arrival but not currently hypoxic; gas pending would not be surprised if he is hypercarbic.  Very poor respiratory effort and air movement.  Due to concern for possible seizure as an etiology he was loaded with Keppra 60 mg/kg.  I was unable to give  him Ativan due to his respiratory status and DNR.  He is being hooked up to continuous EEG with MRI compatible leads.  We will obtain MRI when able.  Continue Keppra ESRD dosing.  Addendum 1218 please see same day neurology consult note.  Greg Leaks, MD [facility] [phone]  If 7pm- 7am, please page neurology on call as listed in AMION.

## 2024-05-18 NOTE — ED Notes (Signed)
 PT drops in to the mid 30's and 40's periodically.  ECG captured at rate of 38. Admitting notified.

## 2024-05-18 NOTE — Consult Note (Addendum)
 NEUROLOGY CONSULT NOTE   Date of service: May 18, 2024 Patient Name: [name] MRN:  [id number] DOB:  Aug 01, 1951 Chief Complaint: stroke code Requesting Provider: Dr. Monnie Anthony  History of Present Illness   This is a 73 year old gentleman with ESRD on HD, thrombocytopenia, anemia of chronic disease, MGUS, CHF, hypertension, type 2 diabetes, chronic respiratory failure, wheelchair-bound at baseline, recently discharged from [facility] on 05/07/24 after being admitted with septic shock secondary to E. coli UTI complicated by acute on chronic respiratory failure with hypercapnia requiring ICU admission, intubation, and pressors.  Stroke code was activated by EMS this morning after they were called to dialysis after he became acutely altered and unresponsive.  I was unable to reach anybody at the dialysis center with the main number or with the number that EMS documented called 911 this morning but per EMS facility staff said that he arrived at approximately 5:20 AM for dialysis and then stopped dialysis was stopped at 645 because he was noted to be unresponsive.  They did not tell EMS and what neurologic condition he presented to dialysis this morning although presumably he was at least somewhat responsive at that point.  Last known well (baseline deficits) at some point yesterday. Patient is wheelchair bound at baseline with mRS of 4. Lives by himself but has close friend Erna He who checks on him daily.    EMS reported intermittent desats to 70s en route, required bagging en route 2x, not hypoxic on arrival to ED bridge but concern for not protecting airway 2/2 unresponsiveness and minimal gag. I did feel he was stable enough for STAT noncon head CT therefore proceeded. CT head showed no ICH or acute process on personal review. Patient was sent by dialysis center with a binder that showed form with a checked box that said patient advance directives are FULL CODE.   On my exam not responsive to commands or  noxious stimuli, pupils 2mm sluggish but reactive, (-) corneals and oculocephalics, minimal gag, no response to deep sternal rub or noxious stimuli in any extremity.  Patient not a candidate for TNK 2/2 unknown last known well and contraindication of being on eliquis  for a fib last dose unknown. CTA H&N showed no LVO but did show extensive severe calcified atherosclerosis throughout the head and neck including radiographic string sign stenoses of bilateral ICA siphons and bilateral distal vertebrals. Would not have been candidate for IR regardless of findings 2/2 baseline mRS of 4.  Patient did not further decompensate from a respiratory standpoint while in CT but exam did not improve and I remained concerned that we would have to emergently intubate for airway protection.  I called his brother Breven Guidroz to confirm code status. Brother and I had a very long phone conversation about what his goals of care should be.  Brother reports that family was not aware that patient was in the hospital last admission until he was already intubated.  He initially said that he thought his brother would want everything done but upon further reflection he stated that his brother told him after he was extubated 2 weeks ago that he never wanted to go through those interventions specifically including intubation again.  He therefore decided that CODE STATUS should be DO NOT RESUSCITATE consistent with patient's most recently expressed wishes.  Loaded with 60mg /kg keppra, not given ativan 2/2 tenuous respiratory status and unresponsiveness  LKW: unknown sometime yesterday Modified rankin score: 4-Needs assistance to walk and tend to bodily needs IV Thrombolysis: no outside  window and on eliquis  EVT: no, no LVO and mRS of 4  NIHSS components Score: Comment  1a Level of Conscious 0[]  1[]  2[]  3[x]      1b LOC Questions 0[]  1[]  2[x]       1c LOC Commands 0[]  1[]  2[x]       2 Best Gaze 0[x]  1[]  2[]       3 Visual 0[x]  1[]  2[]   3[]      4 Facial Palsy 0[x]  1[]  2[]  3[]      5a Motor Arm - left 0[]  1[]  2[]  3[]  4[x]  UN[]    5b Motor Arm - Right 0[]  1[]  2[]  3[]  4[x]  UN[]    6a Motor Leg - Left 0[]  1[]  2[]  3[]  4[x]  UN[]    6b Motor Leg - Right 0[]  1[]  2[]  3[]  4[x]  UN[]    7 Limb Ataxia 0[x]  1[]  2[]  3[]  UN[]     8 Sensory 0[]  1[]  2[x]  UN[]      9 Best Language 0[]  1[]  2[]  3[x]      10 Dysarthria 0[]  1[]  2[x]  UN[]      11 Extinct. and Inattention 0[x]  1[]  2[]       TOTAL:  30      ROS  Unable to ascertain due to unresponsiveness  Past History   Past Medical History:  Diagnosis Date   Arthritis    Asthma    as a child   Chronic cystitis    Chronic diastolic heart failure (HCC)    Chronic kidney disease    HD pt, 3 times a week.   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, too chronically sick to do VATS   PVD (peripheral vascular disease) (HCC)    Renal insufficiency    UMBILICAL HERNIA 02/08/2007   Qualifier: History of  By: Peggy Bowens MD, Shane     Venous insufficiency     Past Surgical History:  Procedure Laterality Date   A/V FISTULAGRAM N/A 02/02/2024   Procedure: A/V Fistulagram;  Surgeon: Baron Border, MD;  Location: [facility] INVASIVE CV LAB;  Service: Cardiovascular;  Laterality: N/A;   ABDOMINAL AORTOGRAM W/LOWER EXTREMITY N/A 05/19/2017   Procedure: Abdominal Aortogram w/Lower Extremity;  Surgeon: Richrd Char, MD;  Location: [facility] INVASIVE CV LAB;  Service: Cardiovascular;  Laterality: N/A;   ABDOMINAL AORTOGRAM W/LOWER EXTREMITY N/A 06/18/2021   Procedure: ABDOMINAL AORTOGRAM W/LOWER EXTREMITY;  Surgeon: Richrd Char, MD;  Location: [facility] INVASIVE CV LAB;  Service:  Cardiovascular;  Laterality: N/A;   ABDOMINAL AORTOGRAM W/LOWER EXTREMITY N/A 04/17/2023   Procedure: ABDOMINAL AORTOGRAM W/LOWER EXTREMITY;  Surgeon: Adine Hoof, MD;  Location: [facility] INVASIVE CV LAB;  Service: Cardiovascular;  Laterality: N/A;   ABDOMINAL AORTOGRAM W/LOWER EXTREMITY N/A 05/22/2023   Procedure: ABDOMINAL AORTOGRAM W/LOWER EXTREMITY;  Surgeon: Adine Hoof, MD;  Location: [facility] INVASIVE CV LAB;  Service: Cardiovascular;  Laterality: N/A;   AMPUTATION     Right and left fifth toes.    AMPUTATION Left 06/18/2021   Procedure: Amputation transmetatarsal of toes 2 3 and 4 left foot ;  Surgeon: Richrd Char, MD;  Location: [facility] OR;  Service: Vascular;  Laterality:  Left;   AMPUTATION TOE     emoval of both little toes   AV FISTULA PLACEMENT Left 03/21/2014   Procedure: ARTERIOVENOUS (AV) FISTULA CREATION with ultrasound;  Surgeon: Mayo Speck, MD;  Location: [facility] OR;  Service: Vascular;  Laterality: Left;   COLONOSCOPY     EYE SURGERY Bilateral    cataract and lens implant   FEMORAL-TIBIAL BYPASS GRAFT Left 06/18/2021   Procedure: Left below-knee popliteal to posterior tibial artery bypass with 9 reversed left greater saphenous vein;  Surgeon: Richrd Char, MD;  Location: [facility] OR;  Service: Vascular;  Laterality: Left;   HERNIA REPAIR     IR IMAGE GUIDED FLUID DRAIN BY CATHETER  04/09/2024   IR THORACENTESIS ASP PLEURAL SPACE W/IMG GUIDE  04/05/2024   LIGATION OF COMPETING BRANCHES OF ARTERIOVENOUS FISTULA Left 06/29/2015   Procedure: LIGATION OF LEFT ARM RADIOCEPHALIC ARTERIOVENOUS FISTULA SIDE BRANCHES;  Surgeon: Arvil Lauber, MD;  Location: [facility] OR;  Service: Vascular;  Laterality: Left;   MULTIPLE EXTRACTIONS WITH ALVEOLOPLASTY N/A 04/07/2017   Procedure: Extraction of tooth #'s 1-11, 13, 14,16, 20-23, and 26-28 with alveoloplasty;  Surgeon: Carol Chroman, DDS;  Location: [facility] OR;  Service: Oral Surgery;  Laterality: N/A;   PERIPHERAL VASCULAR INTERVENTION Right  04/17/2023   Procedure: PERIPHERAL VASCULAR INTERVENTION;  Surgeon: Adine Hoof, MD;  Location: [facility] INVASIVE CV LAB;  Service: Cardiovascular;  Laterality: Right;  Right SFA Stent   PERIPHERAL VASCULAR INTERVENTION Left 05/22/2023   Procedure: PERIPHERAL VASCULAR INTERVENTION;  Surgeon: Adine Hoof, MD;  Location: [facility] INVASIVE CV LAB;  Service: Cardiovascular;  Laterality: Left;  SFA   Popliteal to posterior tibial bypass     2006   R knee arthoscopic repair of meniscus     UMBILICAL HERNIA REPAIR      Family History: Family History  Problem Relation Age of Onset   Diabetes Mother    Heart disease Mother    Diabetes Brother     Social History  reports that he quit smoking about 44 years ago. His smoking use included cigarettes. He has quit using smokeless tobacco. He reports that he does not drink alcohol and does not use drugs.  Allergies  Allergen Reactions   Shellfish Allergy Swelling   Shrimp (Diagnostic) Swelling    Medications   Current Facility-Administered Medications:    heparin  injection 5,000 Units, 5,000 Units, Subcutaneous, Q8H, Malen Scudder, DO  Current Outpatient Medications:    acetaminophen  (TYLENOL ) 325 MG tablet, Take 650 mg by mouth every 6 (six) hours as needed for mild pain (pain score 1-3) or fever., Disp: , Rfl:    apixaban  (ELIQUIS ) 5 MG TABS tablet, Take 1 tablet (5 mg total) by mouth 2 (two) times daily., Disp: 60 tablet, Rfl:    aspirin  81 MG chewable tablet, Chew 81 mg by mouth daily., Disp: , Rfl:    atorvastatin  (LIPITOR ) 80 MG tablet, Take 80 mg by mouth at bedtime., Disp: , Rfl:    cinacalcet  (SENSIPAR ) 30 MG tablet, Take 1 tablet (30 mg total) by mouth every Tuesday, Thursday, and Saturday at 6 PM., Disp: , Rfl:    docusate sodium  (COLACE) 100 MG capsule, Take 100 mg by mouth daily., Disp: , Rfl:    doxycycline  (VIBRAMYCIN ) 100 MG capsule, Take 100 mg by mouth 2 (two) times daily., Disp: , Rfl:    Glucagon, rDNA,  (GLUCAGON EMERGENCY) 1 MG KIT, Inject 1 mg into the skin as needed (hypoglycemia)., Disp: , Rfl:  levothyroxine  (SYNTHROID ) 50 MCG tablet, Take 50 mcg by mouth daily before breakfast., Disp: , Rfl:    midodrine  (PROAMATINE ) 10 MG tablet, Take 1 tablet (10 mg total) by mouth 3 (three) times daily with meals., Disp: , Rfl:    multivitamin (RENA-VIT) TABS tablet, Take 1 tablet by mouth daily., Disp: , Rfl:    sucroferric oxyhydroxide (VELPHORO ) 500 MG chewable tablet, Chew 1,000 mg by mouth 3 (three) times daily with meals., Disp: , Rfl:   Vitals   Vitals:   06/10/2024 1015 Jun 10, 2024 1030 06/10/24 1045 06-10-2024 1100  BP: (!) 94/52 (!) 100/55 (!) 102/56 (!) 108/56  Pulse:      Resp: (!) 4 (!) 4 10 15   Temp:      TempSrc:      SpO2:    99%  Weight:        Body mass index is 22.14 kg/m.  Physical Exam   Gen: patient lying in bed, unresponsive CV: RRR Resp: minimal air movement bilaterally, poor respiratory effort  Neurologic Examination   Not responsive to commands or noxious stimuli, pupils 2mm sluggish but reactive, (-) corneals and oculocephalics, minimal gag, no response to deep sternal rub or noxious stimuli in any extremity.  Labs/Imaging/Neurodiagnostic studies   CBC:  Recent Labs  Lab 2024-06-10 0732 2024-06-10 0737 2024/06/10 0850  WBC 9.5  --   --   NEUTROABS 7.4  --   --   HGB 9.1* 10.5* 10.5*  HCT 30.3* 31.0* 31.0*  MCV 99.0  --   --   PLT 217  --   --    Basic Metabolic Panel:  Lab Results  Component Value Date   NA 139 06-10-24   K 3.9 06-10-2024   CO2 28 06/10/24   GLUCOSE 99 06/10/24   BUN 52 (H) 06/10/2024   CREATININE 8.00 (H) June 10, 2024   CALCIUM  8.6 (L) 06/10/2024   GFRNONAA 6 (L) 2024-06-10   GFRAA 6 (L) 09/12/2018   Lipid Panel:  Lab Results  Component Value Date   LDLCALC 56 06/19/2021   HgbA1c:  Lab Results  Component Value Date   HGBA1C 4.5 (L) 05/01/2024   Urine Drug Screen:     Component Value Date/Time   LABOPIA NONE  DETECTED 05/01/2024 1045   COCAINSCRNUR NONE DETECTED 05/01/2024 1045   LABBENZ NONE DETECTED 05/01/2024 1045   AMPHETMU NONE DETECTED 05/01/2024 1045   THCU NONE DETECTED 05/01/2024 1045   LABBARB NONE DETECTED 05/01/2024 1045    Alcohol Level     Component Value Date/Time   ETH <15 06/10/2024 0732   INR  Lab Results  Component Value Date   INR 1.7 (H) 10-Jun-2024   APTT  Lab Results  Component Value Date   APTT 60 (H) 06-10-24   AED levels: No results found for: "PHENYTOIN", "ZONISAMIDE", "LAMOTRIGINE", "LEVETIRACETA"  CT Head without contrast(Personally reviewed): 1. Stable non contrast CT appearance of small vessel disease. No acute cortically based infarct or acute intracranial hemorrhage identified. ASPECTS 10.  CT angio Head and Neck with contrast(Personally reviewed): 1. Negative for large vessel occlusion. 2. But Positive for very severe calcified atherosclerosis throughout the head and neck. RADIOGRAPHIC STRING SIGN STENOSES of the BILATERAL ICA siphons AND BILATERAL distal Vertebral arteries. Tandem Severe bilateral extracranial vertebral artery stenoses. Right ICA bulb 60% stenosis.  Left ICA bulb 50% stenosis. Aortic Atherosclerosis (ICD10-I70.0). 3. Relative sparing of the circle-of-Willis branches, but mild to moderate bilateral PCA P1/P2 stenosis. 4. Retained secretions in the subglottic trachea and Patchy peripheral  right upper lobe opacity mild ground-glass. Consider developing Bronchopneumonia.  ASSESSMENT   This is a 73 year old gentleman with ESRD on HD, thrombocytopenia, anemia of chronic disease, MGUS, CHF, hypertension, type 2 diabetes, chronic respiratory failure, wheelchair-bound at baseline, recently discharged from [facility] on 05/07/24 after being admitted with septic shock secondary to E. coli UTI complicated by acute on chronic respiratory failure with hypercapnia requiring ICU admission, intubation, and pressors.  Stroke code was activated by  EMS this morning after they were called to dialysis after he became acutely altered and unresponsive.    On my exam not responsive to commands or noxious stimuli, pupils 2mm sluggish but reactive, (-) corneals and oculocephalics, minimal gag, no response to deep sternal rub or noxious stimuli in any extremity.   My exam showed severe neurologic deficits but was nonfocal. EMS reported that he had R gaze deviation on their arrival so I was concerned that stroke or seizure could have precipitated unresponsiveness. Patient not a candidate for TNK 2/2 unknown last known well and contraindication of being on eliquis  for a fib last dose unknown. CTA H&N showed no LVO but did show extensive severe calcified atherosclerosis throughout the head and neck including radiographic string sign stenoses of bilateral ICA siphons and bilateral distal vertebrals.  Patient comatose NIHSS = 30 minimal gag not protecting airway hypercarbic pCO2 109.4 with pH 7.089. I was able to get in touch with brother [name] by phone who after a very long discussion decided DNAR status was most consistent with brother's wishes expressed to him after he was extubated 2 weeks ago that he would never want that level of intervention again including breathing tube. He was therefore not intubated. Based on my discussion with brother he is currently DNAR but NOT comfort care.   EMS reported that he had right gaze deviation on their arrival which is concerning for lateralizing neuro process stroke versus seizure.  But on my exam he was nonfocal albeit with profound neurologic deficits.  There was no intracranial hemorrhage or large territory acute ischemia on head CT and no LVO on CTA.  I loaded him with Keppra status dosing in case he was having nonconvulsive seizures and he is currently being hooked up to continuous EEG. If this and further lab workup is unrevealing for primary neurologic etiology of presentation however I suspect this will end up  being global ischemic injury in the setting of acute on chronic hypercarbic respiratory failure, hypotension, critical bilateral cerebrovascular stenoses.  His cerebrovascular is so critically stenosed that he would need to go into cardiac arrest to have anoxic brain injury.  RECOMMENDATIONS   - Acute medical management of acute on chronic hypercarbic respiratory failure by ED and admitting team. Discussed with Dr. Monnie Anthony, I favored admission to CCM bc while patient was DNAR he was again not comfort care. I did tell him I would defer to his discretion - I am surprised patient has not further deteriorated and if he stabilizes hospitalist admission may be appropriate. - cEEG, MRI compatible leads - Continue keppra ESRD dosing 500mg  daily + extra 250mg  after each dialysis - Consult to nephrology for dialysis as indicated - Will continue to follow  This patient is critically ill and at significant risk of neurological worsening, death and care requires constant monitoring of vital signs, hemodynamics,respiratory and cardiac monitoring, neurological assessment, discussion with family, other specialists and medical decision making of high complexity. I spent 100 minutes of neurocritical care time  in the care of  this patient.  This was time spent independent of any time provided by nurse practitioner or PA.  Greg Leaks, MD [facility] [phone]  If 7pm- 7am, please page neurology on call as listed in AMION.

## 2024-05-18 NOTE — Progress Notes (Signed)
 LTM EEG hooked up and running - no initial skin breakdown - push button tested - [facility] NOT monitoring patient in ED.

## 2024-05-18 NOTE — ED Provider Notes (Signed)
 [place] EMERGENCY DEPARTMENT AT [facility] Provider Note   CSN: [id number] Arrival date & time: 05/18/24  4098  An emergency department physician performed an initial assessment on this suspected stroke patient at 0720.  History  Chief complaint: Altered mental status possible stroke [name] is a 73 y.o. male.  HPI   Patient has a complicated medical history including diabetes, chronic pleural effusion, hypercapnic respiratory failure hypertension hyperlipidemia peripheral vascular disease chronic kidney disease dialysis.  Patient was also recently admitted to the hospital on May 21.  He was discharged on May 27.  At that time patient was admitted to the hospital for sepsis.  He was intubated.  Patient also had a thoracentesis.  Patient went to dialysis this morning.  Patient reportedly had a change in mental status.  Patient became nonresponsive.  He was noted to be hypoxic initially.  EMS started him on oxygen.  Patient was activated as a code stroke.  Patient is not able to answer any questions provide any history  Home Medications Prior to Admission medications   Medication Sig Start Date End Date Taking? Authorizing Provider  acetaminophen  (TYLENOL ) 325 MG tablet Take 650 mg by mouth every 6 (six) hours as needed for mild pain (pain score 1-3) or fever.    [provider]  apixaban  (ELIQUIS ) 5 MG TABS tablet Take 1 tablet (5 mg total) by mouth 2 (two) times daily. 06/27/21   Montey Apa, DO  aspirin  81 MG chewable tablet Chew 81 mg by mouth daily.    [provider]  atorvastatin  (LIPITOR ) 80 MG tablet Take 80 mg by mouth at bedtime.    [provider]  cinacalcet  (SENSIPAR ) 30 MG tablet Take 1 tablet (30 mg total) by mouth every Tuesday, Thursday, and Saturday at 6 PM. 04/18/24   [name], MD  docusate sodium  (COLACE) 100 MG capsule Take 100 mg by mouth daily.    [provider]  Glucagon, rDNA, (GLUCAGON EMERGENCY)  1 MG KIT Inject 1 mg into the skin as needed (hypoglycemia). 04/18/24   [provider]  levothyroxine  (SYNTHROID ) 50 MCG tablet Take 50 mcg by mouth daily before breakfast.    [provider]  midodrine  (PROAMATINE ) 10 MG tablet Take 1 tablet (10 mg total) by mouth 3 (three) times daily with meals. 05/07/24   [name], [name], MD  multivitamin (RENA-VIT) TABS tablet Take 1 tablet by mouth daily.    [provider]  sucroferric oxyhydroxide (VELPHORO ) 500 MG chewable tablet Chew 1,000 mg by mouth 3 (three) times daily with meals.    [provider]      Allergies    Shellfish allergy and Shrimp (diagnostic)    Review of Systems   Review of Systems  Physical Exam Updated Vital Signs BP (!) 148/49 (BP Location: Right Arm)   Pulse 72   Wt 70 kg   SpO2 100%   BMI 22.14 kg/m  Physical Exam Vitals and nursing note reviewed.  Constitutional:      Appearance: He is ill-appearing.  HENT:     Head: Normocephalic and atraumatic.     Right Ear: External ear normal.     Left Ear: External ear normal.  Eyes:     General: No scleral icterus.       Right eye: No discharge.        Left eye: No discharge.     Conjunctiva/sclera: Conjunctivae normal.  Neck:     Trachea: No tracheal deviation.  Cardiovascular:     Rate and Rhythm: Normal rate and regular rhythm.  Pulmonary:     Effort: No respiratory distress.     Breath sounds: Normal breath sounds. No stridor. No wheezing or rales.  Abdominal:     General: Bowel sounds are normal. There is no distension.     Palpations: Abdomen is soft.     Tenderness: There is no abdominal tenderness. There is no guarding or rebound.  Musculoskeletal:        General: No tenderness or deformity.     Cervical back: Neck supple.     Right lower leg: Edema present.     Left lower leg: Edema present.  Skin:    General: Skin is warm and dry.     Findings: No rash.     Comments: Thickened woody skin lower extremities   Neurological:     General: No focal deficit present.     Cranial Nerves: No cranial nerve deficit, dysarthria or facial asymmetry.     Sensory: No sensory deficit.     Motor: No abnormal muscle tone or seizure activity.     Coordination: Coordination normal.  Psychiatric:        Mood and Affect: Mood normal.     ED Results / Procedures / Treatments   Labs (all labs ordered are listed, but only abnormal results are displayed) Labs Reviewed  PROTIME-INR - Abnormal; Notable for the following components:      Result Value   Prothrombin Time 20.2 (*)    INR 1.7 (*)    All other components within normal limits  APTT - Abnormal; Notable for the following components:   aPTT 60 (*)    All other components within normal limits  CBC - Abnormal; Notable for the following components:   RBC 3.06 (*)    Hemoglobin 9.1 (*)    HCT 30.3 (*)    RDW 15.8 (*)    All other components within normal limits  DIFFERENTIAL - Abnormal; Notable for the following components:   Monocytes Absolute 1.1 (*)    All other components within normal limits  COMPREHENSIVE METABOLIC PANEL WITH GFR - Abnormal; Notable for the following components:   Glucose, Bld 101 (*)    BUN 37 (*)    Creatinine, Ser 8.71 (*)    Calcium  8.6 (*)    Albumin 3.1 (*)    GFR, Estimated 6 (*)    All other components within normal limits  I-STAT CHEM 8, ED - Abnormal; Notable for the following components:   Chloride 97 (*)    BUN 52 (*)    Creatinine, Ser 8.00 (*)    Calcium , Ion 1.06 (*)    TCO2 33 (*)    Hemoglobin 10.5 (*)    HCT 31.0 (*)    All other components within normal limits  I-STAT VENOUS BLOOD GAS, ED - Abnormal; Notable for the following components:   pH, Ven 7.089 (*)    pCO2, Ven 109.4 (*)    pO2, Ven 150 (*)    Bicarbonate 33.1 (*)    TCO2 36 (*)    HCT 31.0 (*)    Hemoglobin 10.5 (*)    All other components within normal limits  CULTURE, BLOOD (ROUTINE X 2)  CULTURE, BLOOD (ROUTINE X 2)  ETHANOL   I-STAT CG4 LACTIC ACID, ED  CBG MONITORING, ED    EKG None  Radiology DG Chest Portable 1 View Result Date: 05/18/2024 CLINICAL DATA:  Shortness of breath chest  EXAM: PORTABLE CHEST 1 VIEW COMPARISON:  X-ray dated 05/16/2024. FINDINGS: Moderate right pleural effusion with right lung base atelectasis or infiltrate similar to prior radiograph. No pneumothorax. The cardiac silhouette. Atherosclerotic calcification of the aorta. No acute osseous pathology the IMPRESSION: Moderate right pleural effusion with right lung base atelectasis or infiltrate. Electronically Signed   By: Angus Bark M.D.   On: 05/18/2024 08:23   CT ANGIO HEAD NECK W WO CM (CODE STROKE) Result Date: 05/18/2024 CLINICAL DATA:  73 year old male dialysis patient code stroke presentation. EXAM: CT ANGIOGRAPHY HEAD AND NECK WITH AND WITHOUT CONTRAST TECHNIQUE: Multidetector CT imaging of the head and neck was performed using the standard protocol during bolus administration of intravenous contrast. Multiplanar CT image reconstructions and MIPs were obtained to evaluate the vascular anatomy. Carotid stenosis measurements (when applicable) are obtained utilizing NASCET criteria, using the distal internal carotid diameter as the denominator. RADIATION DOSE REDUCTION: This exam was performed according to the departmental dose-optimization program which includes automated exposure control, adjustment of the mA and/or kV according to patient size and/or use of iterative reconstruction technique. CONTRAST:  75mL OMNIPAQUE  IOHEXOL  350 MG/ML SOLN COMPARISON:  Plain head CT 0735 hours today.  Neck CT 03/24/2017. FINDINGS: CTA NECK Skeleton: Absent dentition. Bulky and advanced cervical spine degeneration. Partially visible upper thoracic spine hyperostosis related interbody ankylosis. No acute osseous abnormality identified. Upper chest: Patchy peripheral right upper lobe opacity on series 6, image 158, mild ground-glass. Left lung apex is  negative. Other neck: Subglottic retained secretions in the trachea. Otherwise the nonvascular neck soft tissue spaces are within normal limits. Aortic arch: Aortic arch not completely included. Arch atherosclerosis is partially visible. This appears to be a 3 vessel arch on coronal images. Right carotid system: Brachiocephalic artery and right CCA are patent with minimal atherosclerosis. Bulky calcified plaque at the right ICA origin and bulb. Maximal stenosis is at the distal bulb level, with soft plaque contribution there also and numerically estimated at 60 % with respect to the distal vessel (series 10, image 62). Right ICA remains patent to the skull base. Left carotid system: Calcified plaque partially visible at the left CCA origin with no stenosis visible. Calcified plaque in the medial vessel proximal to the bifurcation without stenosis. Bulky calcified plaque at the bifurcation, left ICA origin and bulb. Fifty % stenosis with respect to the distal vessel results. Left ICA is patent with mild additional calcified plaque below the skull base. Vertebral arteries: Right subclavian origin calcified plaque without stenosis. Right vertebral origin and V1 segment calcified plaque with severe stenosis, string sign appearance series 9, image 91 but the vessel remains patent. However, the right vertebral artery is asymmetrically smaller. And there is additional severe calcified plaque and stenosis at the V1/V2 junction with a late entry of the right vertebral into the transverse foramen there on series 7, image 263. And additional severe soft and calcified plaque in the proximal V2 segment with severe stenosis also on series 7, image 233. But the vessel remains patent to the skull base. Proximal left subclavian calcified plaque without stenosis. Calcified plaque near the left vertebral artery origin and in the V1 segment but with only mild stenosis on series 9, image 85. V2 segment calcified plaque with moderate to  severe stenosis at the C5 level series 7, image 264. And additional moderate to severe stenosis at the distal V2 level, C2-C3 series 7, image 219. And additional V3 segment bulky calcified plaque although only mild V3 stenosis. CTA HEAD Posterior  circulation: Severely calcified bilateral vertebral artery V4 segments, bilateral V4 string sign stenosis (series 7, image 176). But both vessels are patent to the vertebrobasilar junction. The left V4 is mildly dominant. Patent basilar artery. Patent AICA origins. No basilar stenosis. Patent SCA and PCA origins. Posterior communicating arteries are diminutive or absent. Bilateral PCA branches are patent with mild to moderate irregularity and stenosis in the P1 and P2 segments. Anterior circulation: Both ICA siphons are heavily calcified, severe string sign stenosis through the bilateral cavernous and supraclinoid segments. But both distal ICAs and carotid termini remain patent. MCA and ACA origins are patent. Normal anterior communicating artery. Bilateral ACA branches are within normal limits. Left MCA M1 segment and bifurcation are patent without stenosis. Right MCA M1 segment and bifurcation are patent without stenosis. Only mild MCA branch irregularity, no MCA branch occlusion identified. Venous sinuses: Patent. Anatomic variants: None significant. Review of the MIP images confirms the above findings IMPRESSION: 1. Negative for large vessel occlusion. 2. But Positive for very severe calcified atherosclerosis throughout the head and neck. RADIOGRAPHIC STRING SIGN STENOSES of the BILATERAL ICA siphons AND BILATERAL distal Vertebral arteries. Tandem Severe bilateral extracranial vertebral artery stenoses. Right ICA bulb 60% stenosis.  Left ICA bulb 50% stenosis. Aortic Atherosclerosis (ICD10-I70.0). 3. Relative sparing of the circle-of-Willis branches, but mild to moderate bilateral PCA P1/P2 stenosis. 4. Retained secretions in the subglottic trachea and Patchy peripheral  right upper lobe opacity mild ground-glass. Consider developing Bronchopneumonia. Salient findings were communicated to Dr. Doretta Gant at 8:11 am on 05/18/2024 by text page via the [facility] messaging system. Electronically Signed   By: Marlise Simpers M.D.   On: 05/18/2024 08:11   CT HEAD CODE STROKE WO CONTRAST Result Date: 05/18/2024 CLINICAL DATA:  Code stroke. 73 year old male code stroke presentation, change in mental status during dialysis with rightward gaze. EXAM: CT HEAD WITHOUT CONTRAST TECHNIQUE: Contiguous axial images were obtained from the base of the skull through the vertex without intravenous contrast. RADIATION DOSE REDUCTION: This exam was performed according to the departmental dose-optimization program which includes automated exposure control, adjustment of the mA and/or kV according to patient size and/or use of iterative reconstruction technique. COMPARISON:  Head CT 05/01/2024. Chest radiographs 2 days ago. FINDINGS: Brain: No midline shift, mass effect, or evidence of intracranial mass lesion. No ventriculomegaly. No acute intracranial hemorrhage identified. Patchy and asymmetric bilateral chronic white matter hypodensity and chronic lacunar infarct of left basal ganglia near the left internal capsule are stable. No cortically based acute infarct identified. Vascular: Extensive Calcified atherosclerosis at the skull base. No suspicious intracranial vascular hyperdensity. Skull: Intact.  No acute osseous abnormality identified. Sinuses/Orbits: Visualized paranasal sinuses and mastoids are stable and well aerated. Other: Calcified scalp vessel atherosclerosis. No gaze deviation at this time. Stable orbit and scalp soft tissues. ASPECTS [facility] Stroke Program Early CT Score) Total score (0-10 with 10 being normal): 10 IMPRESSION: 1. Stable non contrast CT appearance of small vessel disease. No acute cortically based infarct or acute intracranial hemorrhage identified. ASPECTS 10. 2. These results were  communicated to Dr. Doretta Gant at 7:43 am on 05/18/2024 by text page via the [facility] messaging system. Electronically Signed   By: Marlise Simpers M.D.   On: 05/18/2024 07:44   DG Chest 2 View Result Date: 05/16/2024 CLINICAL DATA:  Pleural effusion. EXAM: CHEST - 2 VIEW COMPARISON:  May 01, 2024. FINDINGS: Stable cardiomediastinal silhouette. Moderate size right pleural effusion is noted with associated atelectasis or infiltrate. Minimal left basilar  subsegmental atelectasis is noted with minimal left pleural effusion. Bony thorax is unremarkable. IMPRESSION: Moderate size right pleural effusion with associated atelectasis or infiltrate. Electronically Signed   By: Rosalene Colon M.D.   On: 05/16/2024 11:28    Procedures .Critical Care  Performed by: Trish Furl, MD Authorized by: Trish Furl, MD   Critical care provider statement:    Critical care time (minutes):  40   Critical care was time spent personally by me on the following activities:  Development of treatment plan with patient or surrogate, discussions with consultants, evaluation of patient's response to treatment, examination of patient, ordering and review of laboratory studies, ordering and review of radiographic studies, ordering and performing treatments and interventions, pulse oximetry, re-evaluation of patient's condition and review of old charts     Medications Ordered in ED Medications  sodium chloride  flush (NS) 0.9 % injection 3 mL (3 mLs Intravenous Given 05/18/24 0843)  iohexol  (OMNIPAQUE ) 350 MG/ML injection 75 mL (75 mLs Intravenous Contrast Given 05/18/24 0754)  levETIRAcetam (KEPPRA) undiluted injection 4,250 mg (4,250 mg Intravenous Given 05/18/24 0815)    ED Course/ Medical Decision Making/ A&P Clinical Course as of 05/18/24 0914  Sat May 18, 2024  0754 Dr. Doretta Gant had goals of care discussion with family member.  Patient would not want to be intubated.  He was just intubated last month.  Patient will be made DNR [JK]  0759 D/w Dr  Doretta Gant, recommendation to ICU.  Not comfort care [JK]  0819 CBC(!) No leukocytosis [JK]  0819 I-stat chem 8, ED(!) Metabolic panel consistent with his chronic kidney disease [JK]  0819 Protime-INR(!) INR increased at 1.7 [JK]  0819 CT head and angio without acute abnormality [JK]  2202 Case discussed with [facility] pulmonary critical care.  Requests hospitalist evaluation as there does not appear to be an acute critical care need at this time [JK]    Clinical Course User Index [JK] Trish Furl, MD                                 Medical Decision Making Problems Addressed: Acute on chronic respiratory failure with hypercapnia [facility]): acute illness or injury that poses a threat to life or bodily functions Altered mental status, unspecified altered mental status type: acute illness or injury that poses a threat to life or bodily functions  Amount and/or Complexity of Data Reviewed Labs: ordered. Decision-making details documented in ED Course. Radiology: ordered and independent interpretation performed.  Risk Decision regarding hospitalization.   Patient presented to the ED for evaluation of altered mental status.  Presentation concerning for the possibility of acute stroke as well as cerebral hemorrhage.  Patient was activated as a code stroke by EMS.  He was seen by Dr. Doretta Gant neurology team on arrival  CT scan did not show any signs of acute hemorrhage.  No large vessel occlusion.  Possible seizure disorder, occult stroke or other metabolic causes for his mental status felt to be more likely after his initial CT scan.  Patient's laboratory test shows stable chronic kidney disease.  No significant electrolyte abnormalities.  No lactic acidosis.  Patient's venous blood gas however does show significant hypercarbia and a respiratory acidosis.  Dr. Doretta Gant discussed goals of care with patient's brother.  Patient had indicated that he would not want to be intubated.  He was made DNR.   Patient's severe respiratory acidosis may not respond to BiPAP but we  will try considering the patient's wishes.  I consulted with pulmonary critical care in the internal medicine teaching service.  Patient will be admitted to the hospital for further treatment.       Final Clinical Impression(s) / ED Diagnoses Final diagnoses:  Acute on chronic respiratory failure with hypercapnia (HCC)  Altered mental status, unspecified altered mental status type    Rx / DC Orders ED Discharge Orders     None         Trish Furl, MD 05/18/24 [phone]

## 2024-05-18 NOTE — ED Triage Notes (Signed)
 Pt BIB [facility] from dialysis center as a Code Stroke, LKW 645.  Dialysis graft is still accessed.  Unknown how much of his tx he received.  He had been on dialysis since approx. 525.  Staff noticed pt became unresponsive around 645.  They were bagging him when EMS arrived.  PT was on NRB on arrival. PT has forced gaze to right.  EMS VS: 119/57 HR 70 ETC02 28

## 2024-05-18 NOTE — Progress Notes (Signed)
 Evaluated while on BiPAP. Patient opens his eyes to voice, able to move fingers of L hand, moving head slightly. Brother and significant other at bedside.

## 2024-05-18 NOTE — Progress Notes (Signed)
     Call from nursing that he is hypothermic  Plan - Bair hugger - Bank - Zosyn      SIGNATURE    Dr. Maire Scot, M.D., F.C.C.P,  Pulmonary and Critical Care Medicine Staff Physician, [facility] Center Director - Interstitial Lung Disease  Program  Pulmonary Fibrosis [facility] Network at [facility] [place], [place], [address]   Pager: [phone], If no answer  -> Check AMION or Try [phone] Telephone (clinical office): [phone] Telephone (research): [phone]  4:40 PM 05/18/2024

## 2024-05-18 NOTE — Progress Notes (Signed)
 Pharmacy Antibiotic Note  [name] is a 73 y.o. male admitted on 05/18/2024 with hypothermia possible sepsis.  Pharmacy has been consulted for zosyn  and vancomycin  dosing. He is noted with ESRD on HD PTA.   -WBC= 9.5, SCr 8.0  Plan: -Zosyn  3.375gm IV x1 then 2.25gm IV q8h -Vancomycin  1500mg  IV x1 then will follow HD plans -Will follow cultures and clinical progress    Weight: 70 kg (154 lb 5.2 oz)  Temp (24hrs), Avg:95.3 F (35.2 C), Min:92.2 F (33.4 C), Max:96.8 F (36 C)  Recent Labs  Lab 05/18/24 0732 05/18/24 0737 05/18/24 0851 05/18/24 0912  WBC 9.5  --   --   --   CREATININE 8.71* 8.00*  --   --   LATICACIDVEN  --   --  1.2 1.2    Estimated Creatinine Clearance: 7.9 mL/min (A) (by C-G formula based on SCr of 8 mg/dL (H)).    Allergies  Allergen Reactions   Shellfish Allergy Swelling   Shrimp (Diagnostic) Swelling    Antimicrobials this admission: 6/7 zosyn  6/7 vanc   Dose adjustments this admission:   Microbiology results: 6/7 blood x2  Thank you for allowing pharmacy to be a part of this patient's care.  Baxter Limber, PharmD Clinical Pharmacist **Pharmacist phone directory can now be found on [email or web address] ([email or web address]).  Listed under [facility].

## 2024-05-18 NOTE — Consult Note (Signed)
 NAME:  [name], MRN:  [id number], DOB:  Oct 11, 1951, LOS: 0 ADMISSION DATE:  05/18/2024, CONSULTATION DATE:  05/18/24 REFERRING MD:  Ulyess Gammons, CHIEF COMPLAINT:  hpercarbic resp failure    History of Present Illness:    73 year old gentleman with significant failure to thrive multiple ICU admissions.  He had 1 admission April 05, 2024 through Apr 18, 2024 for pleural effusion following a fall at a skilled nursing facility. During this admission in May 2025 he had 2 thoracentesis for moderate pleural effusion and chest tube placement.  Showed chronic inflammation without malignant cells.  He was treated with Unasyn .   Admitted 05/01/2024 acute metabolic encephalopathy, hypercarbic respiratory failure, E. coli UTI requiring intubation and pressors.  He did have some altered mental status postextubation requiring BiPAP.  Discharge 05/07/2024.  He is also ESRD on hemodialysis, thrombocytopenia, chronic anemia, MGUS, hyperlipidemia, wheelchair-bound, hypertension, type 2 diabetes.  He is also on Eliquis  for atrial fibrillation.  He also has chronic right-sided pleural effusion.  He was seen May 16, 2024 at the pulmonary office   On May 18, 2024 presented to the ER because of acute encephalopathy noticed at the dialysis center.  Became unresponsive.  Came in as a code stroke.  Found to be nonresponsive.  According to neurology Dr. Terrial Fetch, "EMS reported that he had R gaze deviation on arrival which is concerning for lateralizing neuro process stroke vs seizure. But on my exam he was nonfocal. No ICH or large territory acute ischemia on head CT and no LVO. Loaded him with keppra in case he was having nonconvulsive seizure and is being hooked up to continuous EEG now. But I suspect this will end up being global ischemic injury in the setting of acute on chronic hypercarbic respiratory failure, hypotension, critical bilateral cerebrovascular stenosis. His cerebrovasculature is so critically stenosed that he  wouldn't need to go into cardiac arrest to have anoxic brain injury. "   VBG 7.089/109/150. The family has elected for no CPR but wanted to try BiPAP patient was started on BiPAP and critical care medicine calledNeurology was also started the patient on long-term EEG.  Per resident - brother [name] wanted to try BiPAP and if fails consider comfort care   Past Medical History:    has a past medical history of Arthritis, Asthma, Chronic cystitis, Chronic diastolic heart failure (HCC), Chronic kidney disease, Coma (HCC) (05/01/2024), Diabetes mellitus, Dysrhythmia, GERD (gastroesophageal reflux disease), H/O hiatal hernia, Hyperlipidemia, Hypertension, Lymphedema, Morbid obesity (HCC), NEPHROLITHIASIS, HX OF (12/02/2009), Pleural effusion on right, PVD (peripheral vascular disease) (HCC), Renal insufficiency, UMBILICAL HERNIA (02/08/2007), and Venous insufficiency.   reports that he quit smoking about 44 years ago. His smoking use included cigarettes. He has quit using smokeless tobacco.  Past Surgical History:  Procedure Laterality Date   A/V FISTULAGRAM N/A 02/02/2024   Procedure: A/V Fistulagram;  Surgeon: Baron Border, MD;  Location: [facility] INVASIVE CV LAB;  Service: Cardiovascular;  Laterality: N/A;   ABDOMINAL AORTOGRAM W/LOWER EXTREMITY N/A 05/19/2017   Procedure: Abdominal Aortogram w/Lower Extremity;  Surgeon: Richrd Char, MD;  Location: [facility] INVASIVE CV LAB;  Service: Cardiovascular;  Laterality: N/A;   ABDOMINAL AORTOGRAM W/LOWER EXTREMITY N/A 06/18/2021   Procedure: ABDOMINAL AORTOGRAM W/LOWER EXTREMITY;  Surgeon: Richrd Char, MD;  Location: [facility] INVASIVE CV LAB;  Service: Cardiovascular;  Laterality: N/A;   ABDOMINAL AORTOGRAM W/LOWER EXTREMITY N/A 04/17/2023   Procedure: ABDOMINAL AORTOGRAM W/LOWER EXTREMITY;  Surgeon: Adine Hoof, MD;  Location: [facility] INVASIVE CV LAB;  Service: Cardiovascular;  Laterality: N/A;   ABDOMINAL AORTOGRAM W/LOWER EXTREMITY N/A 05/22/2023    Procedure: ABDOMINAL AORTOGRAM W/LOWER EXTREMITY;  Surgeon: Adine Hoof, MD;  Location: [facility] INVASIVE CV LAB;  Service: Cardiovascular;  Laterality: N/A;   AMPUTATION     Right and left fifth toes.    AMPUTATION Left 06/18/2021   Procedure: Amputation transmetatarsal of toes 2 3 and 4 left foot ;  Surgeon: Richrd Char, MD;  Location: [facility] OR;  Service: Vascular;  Laterality: Left;   AMPUTATION TOE     emoval of both little toes   AV FISTULA PLACEMENT Left 03/21/2014   Procedure: ARTERIOVENOUS (AV) FISTULA CREATION with ultrasound;  Surgeon: Mayo Speck, MD;  Location: [facility] OR;  Service: Vascular;  Laterality: Left;   COLONOSCOPY     EYE SURGERY Bilateral    cataract and lens implant   FEMORAL-TIBIAL BYPASS GRAFT Left 06/18/2021   Procedure: Left below-knee popliteal to posterior tibial artery bypass with 9 reversed left greater saphenous vein;  Surgeon: Richrd Char, MD;  Location: [facility] OR;  Service: Vascular;  Laterality: Left;   HERNIA REPAIR     IR IMAGE GUIDED FLUID DRAIN BY CATHETER  04/09/2024   IR THORACENTESIS ASP PLEURAL SPACE W/IMG GUIDE  04/05/2024   LIGATION OF COMPETING BRANCHES OF ARTERIOVENOUS FISTULA Left 06/29/2015   Procedure: LIGATION OF LEFT ARM RADIOCEPHALIC ARTERIOVENOUS FISTULA SIDE BRANCHES;  Surgeon: Arvil Lauber, MD;  Location: [facility] OR;  Service: Vascular;  Laterality: Left;   MULTIPLE EXTRACTIONS WITH ALVEOLOPLASTY N/A 04/07/2017   Procedure: Extraction of tooth #'s 1-11, 13, 14,16, 20-23, and 26-28 with alveoloplasty;  Surgeon: Carol Chroman, DDS;  Location: [facility] OR;  Service: Oral Surgery;  Laterality: N/A;   PERIPHERAL VASCULAR INTERVENTION Right 04/17/2023   Procedure: PERIPHERAL VASCULAR INTERVENTION;  Surgeon: Adine Hoof, MD;  Location: [facility] INVASIVE CV LAB;  Service: Cardiovascular;  Laterality: Right;  Right SFA Stent   PERIPHERAL VASCULAR INTERVENTION Left 05/22/2023   Procedure: PERIPHERAL VASCULAR INTERVENTION;  Surgeon: Adine Hoof, MD;  Location: [facility] INVASIVE CV LAB;  Service: Cardiovascular;  Laterality: Left;  SFA   Popliteal to posterior tibial bypass     2006   R knee arthoscopic repair of meniscus     UMBILICAL HERNIA REPAIR      Allergies  Allergen Reactions   Shellfish Allergy Swelling   Shrimp (Diagnostic) Swelling    Immunization History  Administered Date(s) Administered   Hepatitis B, ADULT 08/27/2015, 11/12/2015, 01/09/2016, 02/09/2016, 07/09/2016, 03/09/2017, 04/20/2017, 05/06/2017, 08/15/2017, 10/09/2018, 11/15/2018, 05/09/2020   Pneumococcal Conjugate-13 03/13/2017   Pneumococcal Polysaccharide-23 03/23/2019   Td 05/12/2005    Family History  Problem Relation Age of Onset   Diabetes Mother    Heart disease Mother    Diabetes Brother      Current Facility-Administered Medications:    heparin  injection 5,000 Units, 5,000 Units, Subcutaneous, Q8H, Malen Scudder, DO  Current Outpatient Medications:    acetaminophen  (TYLENOL ) 325 MG tablet, Take 650 mg by mouth every 6 (six) hours as needed for mild pain (pain score 1-3) or fever., Disp: , Rfl:    apixaban  (ELIQUIS ) 5 MG TABS tablet, Take 1 tablet (5 mg total) by mouth 2 (two) times daily., Disp: 60 tablet, Rfl:    aspirin  81 MG chewable tablet, Chew 81 mg by mouth daily., Disp: , Rfl:    atorvastatin  (LIPITOR ) 80 MG tablet, Take 80 mg by mouth at bedtime., Disp: , Rfl:    cinacalcet  (SENSIPAR ) 30  MG tablet, Take 1 tablet (30 mg total) by mouth every Tuesday, Thursday, and Saturday at 6 PM., Disp: , Rfl:    docusate sodium  (COLACE) 100 MG capsule, Take 100 mg by mouth daily., Disp: , Rfl:    doxycycline  (VIBRAMYCIN ) 100 MG capsule, Take 100 mg by mouth 2 (two) times daily., Disp: , Rfl:    Glucagon, rDNA, (GLUCAGON EMERGENCY) 1 MG KIT, Inject 1 mg into the skin as needed (hypoglycemia)., Disp: , Rfl:    levothyroxine  (SYNTHROID ) 50 MCG tablet, Take 50 mcg by mouth daily before breakfast., Disp: , Rfl:    midodrine  (PROAMATINE ) 10 MG  tablet, Take 1 tablet (10 mg total) by mouth 3 (three) times daily with meals., Disp: , Rfl:    multivitamin (RENA-VIT) TABS tablet, Take 1 tablet by mouth daily., Disp: , Rfl:    sucroferric oxyhydroxide (VELPHORO ) 500 MG chewable tablet, Chew 1,000 mg by mouth 3 (three) times daily with meals., Disp: , Rfl:      Significant Hospital Events:  05/18/2024 - admit   Interim History / Subjective:   05/18/2024 - seen in ER room 16  Objective    Vitals:   05/18/24 1015 05/18/24 1030 05/18/24 1045 05/18/24 1100  BP: (!) 94/52 (!) 100/55 (!) 102/56 (!) 108/56  Pulse:      Resp: (!) 4 (!) 4 10 15   Temp:      TempSrc:      SpO2:    99%  Weight:        General Appearance:  Looks criticall ill and EMACIATED Head:  Normocephalic, without obvious abnormality, atraumatic Eyes:  PERRL - yes, conjunctiva/corneas - muddy     Ears:  Normal external ear canals, both ears Nose:  HAS BIPAP Throat:  ETT TUBE - no , OG tube - no Neck:  Supple,  No enlargement/tenderness/nodules Lungs: Clear to auscultation bilaterally, Heart:  S1 and S2 normal, no murmur, CVP - no.  Pressors - no Abdomen:  Soft, no masses, no organomegaly Genitalia / Rectal:  Not done Extremities:  Extremities- intact but chronic edema with skin thiecking Skin:  ntact in exposed areas . Sacral area - not examined Neurologic:  Sedation - none -> RASS - -4. Has EEG .       Assessment & Plan:    Chronic right pleural effusion - non diagnostic present since April 2025 following fall deespite prior thora and chest tube Acute on chronic hypercarbic respiratory failure relatedt o acute obtunded encephalpathy ( EMR sreportesed hyppoxemia en route)  05/18/2024 -> Been on BiPAP despite deep obtundation x few hour. Started in ER  P:   REcheck abg Reaseess true need - and then decide on ICU admit -> deep coma is an exclusion for BiPAP DNI per goals of care Avoid thora - can consider pallaitive pleurx depending on  course    Coma at arrival to ER - NIHSS 30 with minimal gag and not protecting airwa (EMS reporte R Gaze devation CT head: Chronic small vessel ischemia which is extensive in the cerebral white matter with chronic lacunar infarct at the genu of the left internal capsule and right upper brainstem. Generalized volume loss.  - 05/18/2024 neuro loaded patient with keprra .  I suspect this will end up being global ischemic injury in the setting of acute on chronic hypercarbic respiratory failure, hypotension, critical bilateral cerebrovascular stenosis. His cerebrovasculature is so critically stenosed that he wouldn't need to go into cardiac arrest to have anoxic brain injur  P:  MRI if possile cEEG per neuro Neur ocosult      ECHo may 2025 with EFF 55% but RWM abnromlaity +   P: Monitor  ESRD on HD  05/18/2024 - Missed HD due to need to come to ER  P:  Renal consutl needed      HEMATOLOGIC   - HEME A:  An4emia of chronic disease MGUS   P:  - PRBC for hgb </= 6.9gm%    - exceptions are   -  if ACS susepcted/confirmed then transfuse for hgb </= 8.0gm%,  or    -  active bleeding with hemodynamic instability, then transfuse regardless of hemoglobin value   At at all times try to transfuse 1 unit prbc as possible with exception of active hemorrhage     MSK/DERM Failure to thrive PCM    Best practice (daily eval):  Diet: npo Pain/Anxiety/Delirium protocol (if indicated): no sedation VAP protocol (if indicated): x DVT prophylaxis: heparin  GI prophylaxis: ppi Glucose control: ssi Mobility: beed rest Code Status: dnr but not comfort care Family Communication: broteh leroy at bedside Disposition: keep in ER till repeat ABG -> then triage locateion   Goals of Care:       Family Updates: brioterh [name] and friend Erna He at bedside pdated     ATTESTATION & SIGNATURE   The patient [name] is critically ill with multiple organ systems failure and  requires high complexity decision making for assessment and support, frequent evaluation and titration of therapies, application of advanced monitoring technologies and extensive interpretation of multiple databases.   Critical Care Time devoted to patient care services described in this note is  45  Minutes. This time reflects time of care of this signee Dr Maire Scot. This critical care time does not reflect procedure time, or teaching time or supervisory time of PA/NP/Med student/Med Resident etc but could involve care discussion time      SIGNATURE    Dr. Maire Scot, M.D., F.C.C.P,  Pulmonary and Critical Care Medicine Staff Physician, [facility] Center Director - Interstitial Lung Disease  Program  Pulmonary Fibrosis [facility] at [place], [place], [address]  NPI Number:  NPI #[id number]  Pager: [phone], If no answer  -> Check AMION or Try [phone] Telephone (clinical office): [phone] Telephone (research): [phone]  11:35 AM 05/18/2024   05/18/2024 11:35 AM    LABS    PULMONARY Recent Labs  Lab 05/18/24 0737 05/18/24 0850  HCO3  --  33.1*  TCO2 33* 36*  O2SAT  --  98    CBC Recent Labs  Lab 05/18/24 0732 05/18/24 0737 05/18/24 0850  HGB 9.1* 10.5* 10.5*  HCT 30.3* 31.0* 31.0*  WBC 9.5  --   --   PLT 217  --   --     COAGULATION Recent Labs  Lab 05/18/24 0732  INR 1.7*    CARDIAC  No results for input(s): "TROPONINI" in the last 168 hours. No results for input(s): "PROBNP" in the last 168 hours.  CHEMISTRY Recent Labs  Lab 05/18/24 0732 05/18/24 0737 05/18/24 0850  NA 138 137 139  K 4.0 4.5 3.9  CL 98 97*  --   CO2 28  --   --   GLUCOSE 101* 99  --   BUN 37* 52*  --   CREATININE 8.71* 8.00*  --   CALCIUM  8.6*  --   --    Estimated Creatinine  Clearance: 7.9 mL/min (A) (by C-G formula based on SCr of 8 mg/dL (H)).   LIVER Recent Labs  Lab 05/18/24 0732  AST  40  ALT 29  ALKPHOS 56  BILITOT 0.5  PROT 7.3  ALBUMIN 3.1*  INR 1.7*     INFECTIOUS Recent Labs  Lab 05/18/24 0851 05/18/24 0912  LATICACIDVEN 1.2 1.2     ENDOCRINE CBG (last 3)  No results for input(s): "GLUCAP" in the last 72 hours.       IMAGING x48h  - image(s) personally visualized  -   highlighted in bold DG Chest Portable 1 View Result Date: 05/18/2024 CLINICAL DATA:  Shortness of breath chest EXAM: PORTABLE CHEST 1 VIEW COMPARISON:  X-ray dated 05/16/2024. FINDINGS: Moderate right pleural effusion with right lung base atelectasis or infiltrate similar to prior radiograph. No pneumothorax. The cardiac silhouette. Atherosclerotic calcification of the aorta. No acute osseous pathology the IMPRESSION: Moderate right pleural effusion with right lung base atelectasis or infiltrate. Electronically Signed   By: Angus Bark M.D.   On: 05/18/2024 08:23   CT ANGIO HEAD NECK W WO CM (CODE STROKE) Result Date: 05/18/2024 CLINICAL DATA:  73 year old male dialysis patient code stroke presentation. EXAM: CT ANGIOGRAPHY HEAD AND NECK WITH AND WITHOUT CONTRAST TECHNIQUE: Multidetector CT imaging of the head and neck was performed using the standard protocol during bolus administration of intravenous contrast. Multiplanar CT image reconstructions and MIPs were obtained to evaluate the vascular anatomy. Carotid stenosis measurements (when applicable) are obtained utilizing NASCET criteria, using the distal internal carotid diameter as the denominator. RADIATION DOSE REDUCTION: This exam was performed according to the departmental dose-optimization program which includes automated exposure control, adjustment of the mA and/or kV according to patient size and/or use of iterative reconstruction technique. CONTRAST:  75mL OMNIPAQUE  IOHEXOL  350 MG/ML SOLN COMPARISON:  Plain head CT 0735 hours today.  Neck CT 03/24/2017. FINDINGS: CTA NECK Skeleton: Absent dentition. Bulky and advanced cervical  spine degeneration. Partially visible upper thoracic spine hyperostosis related interbody ankylosis. No acute osseous abnormality identified. Upper chest: Patchy peripheral right upper lobe opacity on series 6, image 158, mild ground-glass. Left lung apex is negative. Other neck: Subglottic retained secretions in the trachea. Otherwise the nonvascular neck soft tissue spaces are within normal limits. Aortic arch: Aortic arch not completely included. Arch atherosclerosis is partially visible. This appears to be a 3 vessel arch on coronal images. Right carotid system: Brachiocephalic artery and right CCA are patent with minimal atherosclerosis. Bulky calcified plaque at the right ICA origin and bulb. Maximal stenosis is at the distal bulb level, with soft plaque contribution there also and numerically estimated at 60 % with respect to the distal vessel (series 10, image 62). Right ICA remains patent to the skull base. Left carotid system: Calcified plaque partially visible at the left CCA origin with no stenosis visible. Calcified plaque in the medial vessel proximal to the bifurcation without stenosis. Bulky calcified plaque at the bifurcation, left ICA origin and bulb. Fifty % stenosis with respect to the distal vessel results. Left ICA is patent with mild additional calcified plaque below the skull base. Vertebral arteries: Right subclavian origin calcified plaque without stenosis. Right vertebral origin and V1 segment calcified plaque with severe stenosis, string sign appearance series 9, image 91 but the vessel remains patent. However, the right vertebral artery is asymmetrically smaller. And there is additional severe calcified plaque and stenosis at the V1/V2 junction with a late entry of the right vertebral into the transverse  foramen there on series 7, image 263. And additional severe soft and calcified plaque in the proximal V2 segment with severe stenosis also on series 7, image 233. But the vessel remains  patent to the skull base. Proximal left subclavian calcified plaque without stenosis. Calcified plaque near the left vertebral artery origin and in the V1 segment but with only mild stenosis on series 9, image 85. V2 segment calcified plaque with moderate to severe stenosis at the C5 level series 7, image 264. And additional moderate to severe stenosis at the distal V2 level, C2-C3 series 7, image 219. And additional V3 segment bulky calcified plaque although only mild V3 stenosis. CTA HEAD Posterior circulation: Severely calcified bilateral vertebral artery V4 segments, bilateral V4 string sign stenosis (series 7, image 176). But both vessels are patent to the vertebrobasilar junction. The left V4 is mildly dominant. Patent basilar artery. Patent AICA origins. No basilar stenosis. Patent SCA and PCA origins. Posterior communicating arteries are diminutive or absent. Bilateral PCA branches are patent with mild to moderate irregularity and stenosis in the P1 and P2 segments. Anterior circulation: Both ICA siphons are heavily calcified, severe string sign stenosis through the bilateral cavernous and supraclinoid segments. But both distal ICAs and carotid termini remain patent. MCA and ACA origins are patent. Normal anterior communicating artery. Bilateral ACA branches are within normal limits. Left MCA M1 segment and bifurcation are patent without stenosis. Right MCA M1 segment and bifurcation are patent without stenosis. Only mild MCA branch irregularity, no MCA branch occlusion identified. Venous sinuses: Patent. Anatomic variants: None significant. Review of the MIP images confirms the above findings IMPRESSION: 1. Negative for large vessel occlusion. 2. But Positive for very severe calcified atherosclerosis throughout the head and neck. RADIOGRAPHIC STRING SIGN STENOSES of the BILATERAL ICA siphons AND BILATERAL distal Vertebral arteries. Tandem Severe bilateral extracranial vertebral artery stenoses. Right ICA  bulb 60% stenosis.  Left ICA bulb 50% stenosis. Aortic Atherosclerosis (ICD10-I70.0). 3. Relative sparing of the circle-of-Willis branches, but mild to moderate bilateral PCA P1/P2 stenosis. 4. Retained secretions in the subglottic trachea and Patchy peripheral right upper lobe opacity mild ground-glass. Consider developing Bronchopneumonia. Salient findings were communicated to Dr. Doretta Gant at 8:11 am on 05/18/2024 by text page via the [facility] messaging system. Electronically Signed   By: Marlise Simpers M.D.   On: 05/18/2024 08:11   CT HEAD CODE STROKE WO CONTRAST Result Date: 05/18/2024 CLINICAL DATA:  Code stroke. 73 year old male code stroke presentation, change in mental status during dialysis with rightward gaze. EXAM: CT HEAD WITHOUT CONTRAST TECHNIQUE: Contiguous axial images were obtained from the base of the skull through the vertex without intravenous contrast. RADIATION DOSE REDUCTION: This exam was performed according to the departmental dose-optimization program which includes automated exposure control, adjustment of the mA and/or kV according to patient size and/or use of iterative reconstruction technique. COMPARISON:  Head CT 05/01/2024. Chest radiographs 2 days ago. FINDINGS: Brain: No midline shift, mass effect, or evidence of intracranial mass lesion. No ventriculomegaly. No acute intracranial hemorrhage identified. Patchy and asymmetric bilateral chronic white matter hypodensity and chronic lacunar infarct of left basal ganglia near the left internal capsule are stable. No cortically based acute infarct identified. Vascular: Extensive Calcified atherosclerosis at the skull base. No suspicious intracranial vascular hyperdensity. Skull: Intact.  No acute osseous abnormality identified. Sinuses/Orbits: Visualized paranasal sinuses and mastoids are stable and well aerated. Other: Calcified scalp vessel atherosclerosis. No gaze deviation at this time. Stable orbit and scalp soft tissues. ASPECTS (  [place] Stroke  Program Early CT Score) Total score (0-10 with 10 being normal): 10 IMPRESSION: 1. Stable non contrast CT appearance of small vessel disease. No acute cortically based infarct or acute intracranial hemorrhage identified. ASPECTS 10. 2. These results were communicated to Dr. Doretta Gant at 7:43 am on 05/18/2024 by text page via the [facility] messaging system. Electronically Signed   By: Marlise Simpers M.D.   On: 05/18/2024 07:44

## 2024-05-18 NOTE — Hospital Course (Addendum)
 Recurrent AMS Was LKW at HD. Possible stroke. LKW 645. Became altered at HD.  Looks like related to respiratory acidosis Debating intubation earlier, family said he would not want intubation again so they didn't DNR-DNI but not comfort care Breathing spontaneously but not following commands Whitney with PCCM  =========================================  Not responding to paion Pupils minimally reactive

## 2024-05-18 NOTE — Progress Notes (Addendum)
 eLink Physician-Brief Progress Note Patient Name: [name] DOB: 28-Nov-1951 MRN: [id number]   Date of Service  05/18/2024  HPI/Events of Note  Pt placed on BIPAP approx 1pm an 1st ABG showed PCO2 104, pH 7.12. BSRN noted a follow up ABG hasn't been ordered now Pt has been on BIPAP 9 hr.  AMS. CTH, CTA neg for CVA or LVO.     eICU Interventions  Stat ABG.      Intervention Category Intermediate Interventions: Respiratory distress - evaluation and management  Rexann Catalan 05/18/2024, 10:38 PM  00:00 ABG: Improving type 2 failure. Continue care

## 2024-05-19 ENCOUNTER — Inpatient Hospital Stay (HOSPITAL_COMMUNITY)

## 2024-05-19 DIAGNOSIS — Z515 Encounter for palliative care: Secondary | ICD-10-CM

## 2024-05-19 DIAGNOSIS — G9340 Encephalopathy, unspecified: Secondary | ICD-10-CM

## 2024-05-19 DIAGNOSIS — Z7189 Other specified counseling: Secondary | ICD-10-CM | POA: Diagnosis not present

## 2024-05-19 DIAGNOSIS — R4182 Altered mental status, unspecified: Secondary | ICD-10-CM

## 2024-05-19 DIAGNOSIS — J9622 Acute and chronic respiratory failure with hypercapnia: Secondary | ICD-10-CM | POA: Diagnosis not present

## 2024-05-19 DIAGNOSIS — J9621 Acute and chronic respiratory failure with hypoxia: Secondary | ICD-10-CM

## 2024-05-19 DIAGNOSIS — R569 Unspecified convulsions: Secondary | ICD-10-CM

## 2024-05-19 LAB — CBC
HCT: 27.9 % — ABNORMAL LOW (ref 39.0–52.0)
Hemoglobin: 8.5 g/dL — ABNORMAL LOW (ref 13.0–17.0)
MCH: 30.5 pg (ref 26.0–34.0)
MCHC: 30.5 g/dL (ref 30.0–36.0)
MCV: 100 fL (ref 80.0–100.0)
Platelets: 175 10*3/uL (ref 150–400)
RBC: 2.79 MIL/uL — ABNORMAL LOW (ref 4.22–5.81)
RDW: 15.9 % — ABNORMAL HIGH (ref 11.5–15.5)
WBC: 13 10*3/uL — ABNORMAL HIGH (ref 4.0–10.5)
nRBC: 0.2 % (ref 0.0–0.2)

## 2024-05-19 LAB — COMPREHENSIVE METABOLIC PANEL WITH GFR
ALT: 24 U/L (ref 0–44)
AST: 24 U/L (ref 15–41)
Albumin: 2.7 g/dL — ABNORMAL LOW (ref 3.5–5.0)
Alkaline Phosphatase: 50 U/L (ref 38–126)
Anion gap: 13 (ref 5–15)
BUN: 46 mg/dL — ABNORMAL HIGH (ref 8–23)
CO2: 29 mmol/L (ref 22–32)
Calcium: 8.5 mg/dL — ABNORMAL LOW (ref 8.9–10.3)
Chloride: 96 mmol/L — ABNORMAL LOW (ref 98–111)
Creatinine, Ser: 9.93 mg/dL — ABNORMAL HIGH (ref 0.61–1.24)
GFR, Estimated: 5 mL/min — ABNORMAL LOW (ref >60)
Glucose, Bld: 71 mg/dL (ref 70–99)
Potassium: 4.8 mmol/L (ref 3.5–5.1)
Sodium: 138 mmol/L (ref 135–145)
Total Bilirubin: 0.7 mg/dL (ref 0.0–1.2)
Total Protein: 6.4 g/dL — ABNORMAL LOW (ref 6.5–8.1)

## 2024-05-19 LAB — GLUCOSE, CAPILLARY
Glucose-Capillary: 99 mg/dL (ref 70–99)
Glucose-Capillary: 110 mg/dL — ABNORMAL HIGH (ref 70–99)

## 2024-05-19 MED ORDER — HALOPERIDOL LACTATE 5 MG/ML IJ SOLN: 2.5000 mg | INTRAMUSCULAR | Status: DC | PRN

## 2024-05-19 MED ORDER — HYDROMORPHONE HCL 1 MG/ML IJ SOLN
1.0000 mg | INTRAMUSCULAR | Status: DC | PRN
  Administered 2024-05-19: 1 mg via INTRAVENOUS
  Filled 2024-05-19: qty 2

## 2024-05-19 MED ORDER — GLYCOPYRROLATE 0.2 MG/ML IJ SOLN
0.2000 mg | INTRAMUSCULAR | Status: DC | PRN
  Administered 2024-05-19: 0.2 mg via INTRAVENOUS
  Filled 2024-05-19: qty 1

## 2024-05-19 MED ORDER — GLYCOPYRROLATE 0.2 MG/ML IJ SOLN: 0.2000 mg | INTRAMUSCULAR | Status: DC | PRN

## 2024-05-19 MED ORDER — ONDANSETRON 4 MG PO TBDP: 4.0000 mg | ORAL_TABLET | Freq: Four times a day (QID) | ORAL | Status: DC | PRN

## 2024-05-19 MED ORDER — ONDANSETRON HCL 4 MG/2ML IJ SOLN: 4.0000 mg | Freq: Four times a day (QID) | INTRAMUSCULAR | Status: DC | PRN

## 2024-05-19 MED ORDER — ACETAMINOPHEN 650 MG RE SUPP: 650.0000 mg | Freq: Four times a day (QID) | RECTAL | Status: DC | PRN

## 2024-05-19 MED ORDER — MIDAZOLAM HCL 2 MG/2ML IJ SOLN: 2.0000 mg | INTRAMUSCULAR | Status: DC | PRN

## 2024-05-19 MED ORDER — IPRATROPIUM-ALBUTEROL 0.5-2.5 (3) MG/3ML IN SOLN
3.0000 mL | Freq: Four times a day (QID) | RESPIRATORY_TRACT | Status: DC
Start: 2024-05-19 — End: 2024-05-19

## 2024-05-19 MED ORDER — GLYCOPYRROLATE 1 MG PO TABS: 1.0000 mg | ORAL_TABLET | ORAL | Status: DC | PRN

## 2024-05-19 MED ORDER — ACETAMINOPHEN 325 MG PO TABS: 650.0000 mg | ORAL_TABLET | Freq: Four times a day (QID) | ORAL | Status: DC | PRN

## 2024-05-19 MED ORDER — DIPHENHYDRAMINE HCL 50 MG/ML IJ SOLN: 25.0000 mg | INTRAMUSCULAR | Status: DC | PRN

## 2024-05-19 MED ORDER — POLYVINYL ALCOHOL 1.4 % OP SOLN: 1.0000 [drp] | Freq: Four times a day (QID) | OPHTHALMIC | Status: DC | PRN

## 2024-05-21 LAB — ACID FAST CULTURE WITH REFLEXED SENSITIVITIES (MYCOBACTERIA): Acid Fast Culture: NEGATIVE

## 2024-05-23 LAB — CULTURE, BLOOD (ROUTINE X 2)
Culture: NO GROWTH
Culture: NO GROWTH

## 2024-06-11 NOTE — Death Summary Note (Signed)
 DEATH SUMMARY   Patient Details  Name: [name] MRN: [id number] DOB: March 06, 1951  Admission/Discharge Information   Admit Date:  June 03, 2024  Date of Death: Date of Death: 06/04/24  Time of Death: Time of Death: 1537  Length of Stay: 1  Referring Physician: Shannan Dart., [name]   Reason(s) for Hospitalization  {Trauma:[id number]::"***"}  Diagnoses  Preliminary cause of death:  Secondary Diagnoses (including complications and co-morbidities):  Principal Problem:   Acute encephalopathy Active Problems:   Hypercapnic respiratory failure [facility])   Brief Hospital Course (including significant findings, care, treatment, and services provided and events leading to death)  [name] is a 73 y.o. year old male who ***    Pertinent Labs and Studies  Significant Diagnostic Studies Overnight EEG with video Result Date: 06/04/2024 Arleene Lack, MD     06/04/24  1:54 PM Patient Name: [name] MRN: [id number] Epilepsy Attending: Arleene Lack Referring Physician/Provider: Eleni Griffin, MD Duration: 2024-06-03 1605 to Jun 04, 2024 1308 Patient history: 73 year old gentleman called to dialysis after he became acutely altered and unresponsive. EEG to evaluate for seizure. Level of alertness: Awake/ lethargic AEDs during EEG study: None Technical aspects: This EEG study was done with scalp electrodes positioned according to the 10-20 International system of electrode placement. Electrical activity was reviewed with band pass filter of 1-70Hz , sensitivity of 7 uV/mm, display speed of 71mm/sec with a 60Hz  notched filter applied as appropriate. EEG data were recorded continuously and digitally stored.  Video monitoring was available and reviewed as appropriate. Description: EEG showed continuous generalized 3 to 5 Hz theta-delta slowing admixed with 13-15hz  beta activity. Hyperventilation and photic stimulation were not performed.   ABNORMALITY - Continuous slow, generalized IMPRESSION:  This study is suggestive of moderate to severe diffuse encephalopathy. No seizures or epileptiform discharges were seen throughout the recording. Arleene Lack   DG Chest Portable 1 View Result Date: 06/03/24 CLINICAL DATA:  Shortness of breath chest EXAM: PORTABLE CHEST 1 VIEW COMPARISON:  X-ray dated 05/16/2024. FINDINGS: Moderate right pleural effusion with right lung base atelectasis or infiltrate similar to prior radiograph. No pneumothorax. The cardiac silhouette. Atherosclerotic calcification of the aorta. No acute osseous pathology the IMPRESSION: Moderate right pleural effusion with right lung base atelectasis or infiltrate. Electronically Signed   By: Angus Bark M.D.   On: 06/03/2024 08:23   CT ANGIO HEAD NECK W WO CM (CODE STROKE) Result Date: 03-Jun-2024 CLINICAL DATA:  73 year old male dialysis patient code stroke presentation. EXAM: CT ANGIOGRAPHY HEAD AND NECK WITH AND WITHOUT CONTRAST TECHNIQUE: Multidetector CT imaging of the head and neck was performed using the standard protocol during bolus administration of intravenous contrast. Multiplanar CT image reconstructions and MIPs were obtained to evaluate the vascular anatomy. Carotid stenosis measurements (when applicable) are obtained utilizing NASCET criteria, using the distal internal carotid diameter as the denominator. RADIATION DOSE REDUCTION: This exam was performed according to the departmental dose-optimization program which includes automated exposure control, adjustment of the mA and/or kV according to patient size and/or use of iterative reconstruction technique. CONTRAST:  75mL OMNIPAQUE  IOHEXOL  350 MG/ML SOLN COMPARISON:  Plain head CT 0735 hours today.  Neck CT 03/24/2017. FINDINGS: CTA NECK Skeleton: Absent dentition. Bulky and advanced cervical spine degeneration. Partially visible upper thoracic spine hyperostosis related interbody ankylosis. No acute osseous abnormality identified. Upper chest: Patchy peripheral right  upper lobe opacity on series 6, image 158, mild ground-glass. Left lung apex is negative. Other neck: Subglottic retained secretions in the  trachea. Otherwise the nonvascular neck soft tissue spaces are within normal limits. Aortic arch: Aortic arch not completely included. Arch atherosclerosis is partially visible. This appears to be a 3 vessel arch on coronal images. Right carotid system: Brachiocephalic artery and right CCA are patent with minimal atherosclerosis. Bulky calcified plaque at the right ICA origin and bulb. Maximal stenosis is at the distal bulb level, with soft plaque contribution there also and numerically estimated at 60 % with respect to the distal vessel (series 10, image 62). Right ICA remains patent to the skull base. Left carotid system: Calcified plaque partially visible at the left CCA origin with no stenosis visible. Calcified plaque in the medial vessel proximal to the bifurcation without stenosis. Bulky calcified plaque at the bifurcation, left ICA origin and bulb. Fifty % stenosis with respect to the distal vessel results. Left ICA is patent with mild additional calcified plaque below the skull base. Vertebral arteries: Right subclavian origin calcified plaque without stenosis. Right vertebral origin and V1 segment calcified plaque with severe stenosis, string sign appearance series 9, image 91 but the vessel remains patent. However, the right vertebral artery is asymmetrically smaller. And there is additional severe calcified plaque and stenosis at the V1/V2 junction with a late entry of the right vertebral into the transverse foramen there on series 7, image 263. And additional severe soft and calcified plaque in the proximal V2 segment with severe stenosis also on series 7, image 233. But the vessel remains patent to the skull base. Proximal left subclavian calcified plaque without stenosis. Calcified plaque near the left vertebral artery origin and in the V1 segment but with only  mild stenosis on series 9, image 85. V2 segment calcified plaque with moderate to severe stenosis at the C5 level series 7, image 264. And additional moderate to severe stenosis at the distal V2 level, C2-C3 series 7, image 219. And additional V3 segment bulky calcified plaque although only mild V3 stenosis. CTA HEAD Posterior circulation: Severely calcified bilateral vertebral artery V4 segments, bilateral V4 string sign stenosis (series 7, image 176). But both vessels are patent to the vertebrobasilar junction. The left V4 is mildly dominant. Patent basilar artery. Patent AICA origins. No basilar stenosis. Patent SCA and PCA origins. Posterior communicating arteries are diminutive or absent. Bilateral PCA branches are patent with mild to moderate irregularity and stenosis in the P1 and P2 segments. Anterior circulation: Both ICA siphons are heavily calcified, severe string sign stenosis through the bilateral cavernous and supraclinoid segments. But both distal ICAs and carotid termini remain patent. MCA and ACA origins are patent. Normal anterior communicating artery. Bilateral ACA branches are within normal limits. Left MCA M1 segment and bifurcation are patent without stenosis. Right MCA M1 segment and bifurcation are patent without stenosis. Only mild MCA branch irregularity, no MCA branch occlusion identified. Venous sinuses: Patent. Anatomic variants: None significant. Review of the MIP images confirms the above findings IMPRESSION: 1. Negative for large vessel occlusion. 2. But Positive for very severe calcified atherosclerosis throughout the head and neck. RADIOGRAPHIC STRING SIGN STENOSES of the BILATERAL ICA siphons AND BILATERAL distal Vertebral arteries. Tandem Severe bilateral extracranial vertebral artery stenoses. Right ICA bulb 60% stenosis.  Left ICA bulb 50% stenosis. Aortic Atherosclerosis (ICD10-I70.0). 3. Relative sparing of the circle-of-Willis branches, but mild to moderate bilateral PCA  P1/P2 stenosis. 4. Retained secretions in the subglottic trachea and Patchy peripheral right upper lobe opacity mild ground-glass. Consider developing Bronchopneumonia. Salient findings were communicated to Dr. Doretta Gant at 8:11 am  on 05/18/2024 by text page via the [facility] messaging system. Electronically Signed   By: Marlise Simpers M.D.   On: 05/18/2024 08:11   CT HEAD CODE STROKE WO CONTRAST Result Date: 05/18/2024 CLINICAL DATA:  Code stroke. 73 year old male code stroke presentation, change in mental status during dialysis with rightward gaze. EXAM: CT HEAD WITHOUT CONTRAST TECHNIQUE: Contiguous axial images were obtained from the base of the skull through the vertex without intravenous contrast. RADIATION DOSE REDUCTION: This exam was performed according to the departmental dose-optimization program which includes automated exposure control, adjustment of the mA and/or kV according to patient size and/or use of iterative reconstruction technique. COMPARISON:  Head CT 05/01/2024. Chest radiographs 2 days ago. FINDINGS: Brain: No midline shift, mass effect, or evidence of intracranial mass lesion. No ventriculomegaly. No acute intracranial hemorrhage identified. Patchy and asymmetric bilateral chronic white matter hypodensity and chronic lacunar infarct of left basal ganglia near the left internal capsule are stable. No cortically based acute infarct identified. Vascular: Extensive Calcified atherosclerosis at the skull base. No suspicious intracranial vascular hyperdensity. Skull: Intact.  No acute osseous abnormality identified. Sinuses/Orbits: Visualized paranasal sinuses and mastoids are stable and well aerated. Other: Calcified scalp vessel atherosclerosis. No gaze deviation at this time. Stable orbit and scalp soft tissues. ASPECTS [facility] Stroke Program Early CT Score) Total score (0-10 with 10 being normal): 10 IMPRESSION: 1. Stable non contrast CT appearance of small vessel disease. No acute cortically based  infarct or acute intracranial hemorrhage identified. ASPECTS 10. 2. These results were communicated to Dr. Doretta Gant at 7:43 am on 05/18/2024 by text page via the [facility] messaging system. Electronically Signed   By: Marlise Simpers M.D.   On: 05/18/2024 07:44   DG Chest 2 View Result Date: 05/16/2024 CLINICAL DATA:  Pleural effusion. EXAM: CHEST - 2 VIEW COMPARISON:  May 01, 2024. FINDINGS: Stable cardiomediastinal silhouette. Moderate size right pleural effusion is noted with associated atelectasis or infiltrate. Minimal left basilar subsegmental atelectasis is noted with minimal left pleural effusion. Bony thorax is unremarkable. IMPRESSION: Moderate size right pleural effusion with associated atelectasis or infiltrate. Electronically Signed   By: Rosalene Colon M.D.   On: 05/16/2024 11:28   DG CHEST PORT 1 VIEW Result Date: 05/01/2024 CLINICAL DATA:  Check central line placed EXAM: PORTABLE CHEST 1 VIEW COMPARISON:  Film from earlier in the same day. FINDINGS: Check shadow is stable. Endotracheal tube, gastric catheter and right jugular central line seen. Jugular central line tip is noted at the cavoatrial junction. No pneumothorax is noted. Persistent right-sided pleural effusion and right basilar airspace opacity is noted. Left lung remains clear. IMPRESSION: No pneumothorax following central line placement. Electronically Signed   By: Violeta Grey M.D.   On: 05/01/2024 22:09   ECHOCARDIOGRAM LIMITED Result Date: 05/01/2024    ECHOCARDIOGRAM LIMITED REPORT   Patient Name:   [name] Date of Exam: 05/01/2024 Medical Rec #:  [id number]       Height:       70.0 in Accession #:    [id number]      Weight:       158.5 lb Date of Birth:  Nov 19, 1951       BSA:          1.891 m Patient Age:    73 years        BP:           146/73 mmHg Patient Gender: M  HR:           63 bpm. Exam Location:  Inpatient Procedure: Limited Echo, Limited Color Doppler and Cardiac Doppler (Both            Spectral and Color Flow  Doppler were utilized during procedure). Indications:    Cardiomegaly  History:        Patient has prior history of Echocardiogram examinations, most                 recent 04/06/2024. CHF; Risk Factors:Diabetes and Hypertension.                 CKD.  Sonographer:    Aldon Ambrosia Referring Phys: [id number] Tino Foreman SMITH IMPRESSIONS  1. Septal hypokinesis. Left ventricular ejection fraction, by estimation, is 55 to 60%. The left ventricle has normal function. The left ventricle demonstrates regional wall motion abnormalities (see scoring diagram/findings for description).  2. Right ventricular systolic function is normal. The right ventricular size is normal. There is normal pulmonary artery systolic pressure.  3. The mitral valve is normal in structure. No evidence of mitral valve regurgitation. No evidence of mitral stenosis.  4. The aortic valve is calcified. There is moderate calcification of the aortic valve. There is moderate thickening of the aortic valve. Aortic valve regurgitation is not visualized. Moderate aortic valve stenosis. Aortic valve area, by VTI measures 1.30 cm. Aortic valve mean gradient measures 5.0 mmHg. Aortic valve Vmax measures 1.61 m/s.  5. The inferior vena cava is dilated in size with <50% respiratory variability, suggesting right atrial pressure of 15 mmHg. FINDINGS  Left Ventricle: Septal hypokinesis. Left ventricular ejection fraction, by estimation, is 55 to 60%. The left ventricle has normal function. The left ventricle demonstrates regional wall motion abnormalities. The left ventricular internal cavity size was normal in size. There is no left ventricular hypertrophy.  LV Wall Scoring: The entire septum is hypokinetic. The entire anterior wall, entire lateral wall, entire inferior wall, and apex are normal. Right Ventricle: The right ventricular size is normal. No increase in right ventricular wall thickness. Right ventricular systolic function is normal. There is normal pulmonary  artery systolic pressure. The tricuspid regurgitant velocity is 1.71 m/s, and  with an assumed right atrial pressure of 15 mmHg, the estimated right ventricular systolic pressure is 26.7 mmHg. Left Atrium: Left atrial size was normal in size. Right Atrium: Right atrial size was normal in size. Pericardium: There is no evidence of pericardial effusion. Mitral Valve: The mitral valve is normal in structure. No evidence of mitral valve stenosis. Tricuspid Valve: The tricuspid valve is normal in structure. Tricuspid valve regurgitation is not demonstrated. No evidence of tricuspid stenosis. Aortic Valve: The aortic valve is calcified. There is moderate calcification of the aortic valve. There is moderate thickening of the aortic valve. Aortic valve regurgitation is not visualized. Moderate aortic stenosis is present. Aortic valve mean gradient measures 5.0 mmHg. Aortic valve peak gradient measures 10.4 mmHg. Aortic valve area, by VTI measures 1.30 cm. Pulmonic Valve: The pulmonic valve was normal in structure. Pulmonic valve regurgitation is not visualized. No evidence of pulmonic stenosis. Aorta: The aortic root is normal in size and structure. Venous: The inferior vena cava is dilated in size with less than 50% respiratory variability, suggesting right atrial pressure of 15 mmHg. IAS/Shunts: No atrial level shunt detected by color flow Doppler. LEFT VENTRICLE PLAX 2D LVIDd:         4.30 cm LVIDs:  3.10 cm LV PW:         0.90 cm LV IVS:        0.80 cm LVOT diam:     1.80 cm LV SV:         37 LV SV Index:   19 LVOT Area:     2.54 cm  LV Volumes (MOD) LV vol d, MOD A2C: 71.4 ml LV vol d, MOD A4C: 123.0 ml LV vol s, MOD A2C: 25.5 ml LV vol s, MOD A4C: 38.5 ml LV SV MOD A2C:     45.9 ml LV SV MOD A4C:     123.0 ml LV SV MOD BP:      67.3 ml RIGHT VENTRICLE            IVC RV S prime:     6.53 cm/s  IVC diam: 2.00 cm TAPSE (M-mode): 1.8 cm AORTIC VALVE AV Area (Vmax):    1.45 cm AV Area (Vmean):   1.23 cm AV Area  (VTI):     1.30 cm AV Vmax:           161.00 cm/s AV Vmean:          98.200 cm/s AV VTI:            0.282 m AV Peak Grad:      10.4 mmHg AV Mean Grad:      5.0 mmHg LVOT Vmax:         91.80 cm/s LVOT Vmean:        47.300 cm/s LVOT VTI:          0.144 m LVOT/AV VTI ratio: 0.51 TRICUSPID VALVE TR Peak grad:   11.7 mmHg TR Vmax:        171.00 cm/s  SHUNTS Systemic VTI:  0.14 m Systemic Diam: 1.80 cm Maudine Sos MD Electronically signed by Maudine Sos MD Signature Date/Time: 05/01/2024/2:32:54 PM    Final    EEG adult Result Date: 05/01/2024 Arleene Lack, MD     05/01/2024  4:16 PM Patient Name: [name] MRN: [id number] Epilepsy Attending: Arleene Lack Referring Physician/Provider: Josiah Nigh, MD Date: 05/01/2024 Duration: 23.08 mins Patient history: 73yo M with ams. EEG to evaluate for seizure Level of alertness: Awake/lethargic AEDs during EEG study: None Technical aspects: This EEG study was done with scalp electrodes positioned according to the 10-20 International system of electrode placement. Electrical activity was reviewed with band pass filter of 1-70Hz , sensitivity of 7 uV/mm, display speed of 77mm/sec with a 60Hz  notched filter applied as appropriate. EEG data were recorded continuously and digitally stored.  Video monitoring was available and reviewed as appropriate. Description: EEG showed continuous generalized 3 to 6 Hz theta-delta slowing. Hyperventilation and photic stimulation were not performed.   ABNORMALITY - Continuous slow, generalized IMPRESSION: This study is suggestive of moderate diffuse encephalopathy. No seizures or epileptiform discharges were seen throughout the recording. Arleene Lack   DG Abdomen 1 View Result Date: 05/01/2024 CLINICAL DATA:  OG tube placement. EXAM: ABDOMEN - 1 VIEW COMPARISON:  None Available. FINDINGS: Tip and side port of the enteric tube below the diaphragm in the stomach. Air-filled loops of bowel in the central abdomen may be  gaseous transverse colon. Formed stool in the left colon. IMPRESSION: Tip and side port of the enteric tube below the diaphragm in the stomach. Electronically Signed   By: Chadwick Colonel M.D.   On: 05/01/2024 10:13   DG Chest Portable 1 View Result Date:  05/01/2024 CLINICAL DATA:  Altered mental status, intubated, hypoxia EXAM: PORTABLE CHEST 1 VIEW COMPARISON:  04/15/2024 05/02/2019 FINDINGS: Endotracheal tube 4.1 cm above the carina. NG tube extends below the hemidiaphragm into the stomach, incompletely imaged. Stable left lung aeration with a left basilar calcified pleural plaque. Persistent loculated moderate right pleural effusion and associated right mid and lower lung collapse/consolidation. No significant interval change. No large pneumothorax. Aorta atherosclerotic. Degenerative changes of the spine and left shoulder IMPRESSION: 1. Endotracheal tube 4.1 cm above the carina. 2. Persistent loculated right pleural effusion and associated right mid and lower lung collapse/consolidation. Electronically Signed   By: Melven Stable.  Shick M.D.   On: 05/01/2024 09:27   CT Head Wo Contrast Result Date: 05/01/2024 CLINICAL DATA:  Mental status change with unknown cause EXAM: CT HEAD WITHOUT CONTRAST TECHNIQUE: Contiguous axial images were obtained from the base of the skull through the vertex without intravenous contrast. RADIATION DOSE REDUCTION: This exam was performed according to the departmental dose-optimization program which includes automated exposure control, adjustment of the mA and/or kV according to patient size and/or use of iterative reconstruction technique. COMPARISON:  Head CT 04/05/2024 FINDINGS: Brain: No evidence of acute infarction, hemorrhage, hydrocephalus, extra-axial collection or mass lesion/mass effect. Chronic small vessel ischemia which is extensive in the cerebral white matter with chronic lacunar infarct at the genu of the left internal capsule and right upper brainstem. Generalized volume  loss. Vascular: No hyperdense vessel or unexpected calcification. Skull: Normal. Negative for fracture or focal lesion. Sinuses/Orbits: No acute finding. IMPRESSION: No acute finding. Chronic small vessel ischemia. Electronically Signed   By: Ronnette Coke M.D.   On: 05/01/2024 09:23    Microbiology No results found for this or any previous visit (from the past 240 hours).  Lab Basic Metabolic Panel: Recent Labs  Lab 05/18/24 0732 05/18/24 0737 05/18/24 0850 05/18/24 1307 05/18/24 2340 05/25/2024 0208  NA 138 137 139 136 137 138  K 4.0 4.5 3.9 4.2 4.7 4.8  CL 98 97*  --   --   --  96*  CO2 28  --   --   --   --  29  GLUCOSE 101* 99  --   --   --  71  BUN 37* 52*  --   --   --  46*  CREATININE 8.71* 8.00*  --   --   --  9.93*  CALCIUM  8.6*  --   --   --   --  8.5*   Liver Function Tests: Recent Labs  Lab 05/18/24 0732 06/10/2024 0208  AST 40 24  ALT 29 24  ALKPHOS 56 50  BILITOT 0.5 0.7  PROT 7.3 6.4*  ALBUMIN 3.1* 2.7*   No results for input(s): "LIPASE", "AMYLASE" in the last 168 hours. No results for input(s): "AMMONIA" in the last 168 hours. CBC: Recent Labs  Lab 05/18/24 0732 05/18/24 0737 05/18/24 0850 05/18/24 1307 05/18/24 2340 05/14/2024 0208  WBC 9.5  --   --   --   --  13.0*  NEUTROABS 7.4  --   --   --   --   --   HGB 9.1* 10.5* 10.5* 9.5* 9.9* 8.5*  HCT 30.3* 31.0* 31.0* 28.0* 29.0* 27.9*  MCV 99.0  --   --   --   --  100.0  PLT 217  --   --   --   --  175   Cardiac Enzymes: No results for input(s): "CKTOTAL", "CKMB", "CKMBINDEX", "TROPONINI" in the  last 168 hours. Sepsis Labs: Recent Labs  Lab 05/18/24 0732 05/18/24 0851 05/18/24 0912 05/12/2024 0208  WBC 9.5  --   --  13.0*  LATICACIDVEN  --  1.2 1.2  --     Procedures/Operations  cEEG   [name] 06/09/2024, 3:56 PM

## 2024-06-11 NOTE — Progress Notes (Signed)
 Patient is now awake and reasonably alert. Follows commands appropriately, denies discomfort at this time. Oriented to person and appears calm.

## 2024-06-11 NOTE — Procedures (Addendum)
 Patient Name: ELVERT CUMPTON  MRN: [id number]  Epilepsy Attending: Arleene Lack  Referring Physician/Provider: Eleni Griffin, MD  Duration: 05/18/2024 1605 to 06/07/2024 1308  Patient history: 73 year old gentleman called to dialysis after he became acutely altered and unresponsive. EEG to evaluate for seizure.  Level of alertness: Awake/ lethargic   AEDs during EEG study: None  Technical aspects: This EEG study was done with scalp electrodes positioned according to the 10-20 International system of electrode placement. Electrical activity was reviewed with band pass filter of 1-70Hz , sensitivity of 7 uV/mm, display speed of 26mm/sec with a 60Hz  notched filter applied as appropriate. EEG data were recorded continuously and digitally stored.  Video monitoring was available and reviewed as appropriate.  Description: EEG showed continuous generalized 3 to 5 Hz theta-delta slowing admixed with 13-15hz  beta activity. Hyperventilation and photic stimulation were not performed.     ABNORMALITY - Continuous slow, generalized  IMPRESSION: This study is suggestive of moderate to severe diffuse encephalopathy. No seizures or epileptiform discharges were seen throughout the recording.  Janney Priego O Yavuz Kirby

## 2024-06-11 NOTE — Consult Note (Signed)
 Palliative Care Consult Note                                  Date: 06/03/2024   Patient Name: [name]  DOB: 1951/02/02  MRN: [id number]  Age / Sex: 73 y.o., male  PCP: Shannan Dart., [name] Referring Physician: [name], DO  Reason for Consultation: Establishing goals of care  HPI/Patient Profile: 73 y.o. male  with past medical history of ESRD on HD, atrial fibrillation, chronic diastolic heart failure, T2DM,  who presented to the ED on 05/18/2024 after becoming unresponsive during dialysis. He presented as a code stroke.  He is admitted with acute encephalopathy and acute on chronic hypercarbic respiratory failure.  CT head negative for acute abnormality and EEG negative for seizures.  Of note, patient was recently admitted 05/01/24 - 05/07/24 with acute metabolic encephalopathy, hypercarbic respiratory failure, and septic shock from E. coli UTI requiring intubation and pressors.  Palliative Medicine has been consulted for goals of care discussions. Patient and family are faced with anticipatory care needs and complex medical decision making.   Clinical Assessment and Goals of Care:   Extensive chart review has been completed including labs, vital signs, imaging, progress/consult notes, orders, medications and available advance directive documents.    Patient assessed at bedside.  He is on BiPAP.  He remains minimally responsive.  He has very weak respiratory drive, even on BiPAP.  Myself and Dr. Fulton Job met with brother/[name] at bedside to discuss diagnosis, prognosis, and GOC. [name] confirms he is patient's next of kin and surrogate decision maker.   I introduced Palliative Medicine as specialized medical care for people living with serious illness. It focuses on providing relief from the symptoms and stress of a serious illness.   Created space and opportunity for family to express thoughts and feelings regarding current  medical situation. Values and goals of care were attempted to be elicited.  Discussion: We discussed patient's current illness and what it means in the larger context of his ongoing co-morbidities. Current clinical status was reviewed. We emphasized that patient is critically ill and appears to be at end-of-life.  The difference between full scope medical intervention and comfort care was considered. Discussed the concept of comfort care as de-escalating full scope medical interventions, allowing a natural course to occur. Discussed that the goal is comfort and dignity rather than prolonging life.   I shared my concern that patient may not survive long off BiPAP. Brother is going to ask his son (who is a Optician, dispensing), to provide spiritual support/prayer prior to removing the BiPAP.  I returned to bedside when brother's son has arrived. Family states they are ready to proceed with transition to comfort care. BiPAP was removed to room air. RN is administering a dose of IV dilaudid .   Review of Systems  Unable to perform ROS   Objective:   Primary Diagnoses: Present on Admission:  Acute encephalopathy  Hypercapnic respiratory failure (HCC)   Physical Exam Vitals reviewed.  Constitutional:      General: He is not in acute distress.    Appearance: He is ill-appearing.  Cardiovascular:     Rate and Rhythm: Bradycardia present.  Pulmonary:     Comments: BiPAP Neurological:     Comments: Minimally responsive     Vital Signs:  BP (!) 116/59   Pulse (!) 54   Temp 98.8 F (37.1 C)  Resp (!) 9   Wt 70 kg   SpO2 (!) 89%   BMI 22.14 kg/m   Palliative Assessment/Data: PPS 10%     Assessment & Plan:   SUMMARY OF RECOMMENDATIONS   Transition to comfort care Remove BiPAP PMT will continue to follow  Symptom Management:  Dilaudid  prn for pain or dyspnea Lorazepam (ATIVAN) prn for anxiety Haloperidol (HALDOL) prn for agitation  Glycopyrrolate  (ROBINUL ) for excessive  secretions Ondansetron  (ZOFRAN ) prn for nausea Polyvinyl alcohol (LIQUIFILM TEARS) prn for dry eyes Antiseptic oral rinse (BIOTENE) prn for dry mouth  Primary Decision Maker: NEXT OF KIN - brother/[name]  Code Status/Advance Care Planning: DNR - comfort  Prognosis:  Hours - Days  Discharge Planning:  Anticipated Hospital Death    Thank you for allowing us  to participate in the care of [name]  MDM - High  Detailed review of medical records (labs, imaging, vital signs), medically appropriate exam, discussed with treatment team, counseling and education to patient, family, & staff, documenting clinical information, medication management, coordination of care.   Signed by: Maisie Scotland, NP Palliative Medicine Team  Team Phone # [phone]  For individual providers, please see AMION

## 2024-06-11 NOTE — Progress Notes (Signed)
 LTM VIDEO EEG discontinued - no skin breakdown at [facility]

## 2024-06-11 NOTE — Plan of Care (Signed)
   Problem: Elimination: Goal: Will not experience complications related to bowel motility Outcome: Progressing   Problem: Pain Managment: Goal: General experience of comfort will improve and/or be controlled Outcome: Progressing   Problem: Safety: Goal: Ability to remain free from injury will improve Outcome: Progressing

## 2024-06-11 NOTE — IPAL (Signed)
  Interdisciplinary Goals of Care Family Meeting   Date carried out:: 05/30/2024  Location of the meeting: Bedside  Member's involved: Physician, Family Member or next of kin, and Palliative care team member  Durable Power of Attorney or acting medical decision maker: brother [name]    Discussion: We discussed goals of care for [name] .  We reviewed his chronic poor health status, his recent hospitalizations. [name] previously expressed to his brother that he thought he would die soon. He did not want to go back to his SNF/ group home after his last hospitalization. [name] understands that his brother is likely dying. He had spoken to the admitting team about a time limited trial of BiPAP overnight-- we talked about how I have gone up on that support this morning and he is still hypoventilating despite that. I worry he will not be able to make it long without BiPAP, but he understands it is likely causing suffering. He would not want to restart bipap once it comes off. He understands I anticipate his brother will pass away today.  He is calling his son who is a Optician, dispensing, but does not think we would need to wait for additional family members before coming off BiPAP.   Code status: Full DNR  Disposition: Continue current acute care; anticipate transition to comfort care.   Time spent for the meeting: 15 min.  [name] 05/17/2024, 12:47 PM

## 2024-06-11 NOTE — Progress Notes (Signed)
 NAME:  [name], MRN:  [id number], DOB:  1951-06-03, LOS: 1 ADMISSION DATE:  05/18/2024, CONSULTATION DATE:  05/18/24 REFERRING MD:  [name], CHIEF COMPLAINT:  hpercarbic resp failure    History of Present Illness:    73 year old gentleman with significant failure to thrive multiple ICU admissions.  He had 1 admission April 05, 2024 through Apr 18, 2024 for pleural effusion following a fall at a skilled nursing facility. During this admission in May 2025 he had 2 thoracentesis for moderate pleural effusion and chest tube placement.  Showed chronic inflammation without malignant cells.  He was treated with Unasyn .   Admitted 05/01/2024 acute metabolic encephalopathy, hypercarbic respiratory failure, E. coli UTI requiring intubation and pressors.  He did have some altered mental status postextubation requiring BiPAP.  Discharge 05/07/2024.  He is also ESRD on hemodialysis, thrombocytopenia, chronic anemia, MGUS, hyperlipidemia, wheelchair-bound, hypertension, type 2 diabetes.  He is also on Eliquis  for atrial fibrillation.  He also has chronic right-sided pleural effusion.  He was seen May 16, 2024 at the pulmonary office   On May 18, 2024 presented to the ER because of acute encephalopathy noticed at the dialysis center.  Became unresponsive.  Came in as a code stroke.  Found to be nonresponsive.  According to neurology Dr. Terrial [name], "EMS reported that he had R gaze deviation on arrival which is concerning for lateralizing neuro process stroke vs seizure. But on my exam he was nonfocal. No ICH or large territory acute ischemia on head CT and no LVO. Loaded him with keppra in case he was having nonconvulsive seizure and is being hooked up to continuous EEG now. But I suspect this will end up being global ischemic injury in the setting of acute on chronic hypercarbic respiratory failure, hypotension, critical bilateral cerebrovascular stenosis. His cerebrovasculature is so critically stenosed that he  wouldn't need to go into cardiac arrest to have anoxic brain injury. "   VBG 7.089/109/150. The family has elected for no CPR but wanted to try BiPAP patient was started on BiPAP and critical care medicine calledNeurology was also started the patient on long-term EEG.  Per resident - brother [name] wanted to try BiPAP and if fails consider comfort care   Past Medical History:    has a past medical history of Arthritis, Asthma, Chronic cystitis, Chronic diastolic heart failure (HCC), Chronic kidney disease, Coma (HCC) (05/01/2024), Diabetes mellitus, Dysrhythmia, GERD (gastroesophageal reflux disease), H/O hiatal hernia, Hyperlipidemia, Hypertension, Lymphedema, Morbid obesity (HCC), NEPHROLITHIASIS, HX OF (12/02/2009), Pleural effusion on right, PVD (peripheral vascular disease) (HCC), Renal insufficiency, UMBILICAL HERNIA (02/08/2007), and Venous insufficiency.   reports that he quit smoking about 44 years ago. His smoking use included cigarettes. He has quit using smokeless tobacco.   Significant Hospital Events:  05/18/2024 - admit, bipap for hypercapnia   Interim History / Subjective:  This morning he remains on bipap, minimally responsive.   Objective    Vitals:   05/21/2024 0600 05/21/2024 0700 05/23/2024 0725 06/06/2024 0818  BP:   (!) 116/55 (!) 126/55  Pulse: 69 64 65 65  Resp: 20 11 20  (!) 28  Temp: 98.2 F (36.8 C) 97.7 F (36.5 C) (!) 97.5 F (36.4 C)   TempSrc:      SpO2: 100% 100% 100%   Weight:        General Appearance:  chronically ill, frail appearing man lying in bed in NAD HEENT: [place]/AT, eyes anicteric Lungs: breathing comfortably on BiPAP, very low lung volumes on PS 18/8  Heart: S1S2, bradycardic, reg rhythm. Sinus rhythm on tele.  Abdomen:  soft, NT Extremities:  minimal muscle mass, no edema Neurologic:  minimally interactive, can move both arms. Almost no respiratory effort   Labs reviewed.     Assessment & Plan:   Chronic right pleural effusion -due to  fall deespite > not an active issue Acute on chronic hypoxia & hypercarbic respiratory failure causing his encephalopathy Acute metabolic encephalopathy due to hypercapnia Severe protein energy malnutrition Chronic HFpEF ESRD on HD Anemia of chronic disease MGUS Failure to thrive, debility Hypocalcemia End of life care  -comfort care  Best practice (daily eval):  Diet: npo Pain/Anxiety/Delirium protocol (if indicated): no sedation VAP protocol (if indicated):  DVT prophylaxis: heparin  Code Status: dnr, comfort care Family Communication: brother [name] ipal Disposition: comfort care  Goals of Care:   Family Updates: brother [name]  LABS    PULMONARY Recent Labs  Lab 05/18/24 0737 05/18/24 0850 05/18/24 1307 05/18/24 2340  PHART  --   --  7.120* 7.256*  PCO2ART  --   --  104.3* 72.0*  PO2ART  --   --  74* 69*  HCO3  --  33.1* 33.9* 33.5*  TCO2 33* 36* 37* 36*  O2SAT  --  98 87 93    CBC Recent Labs  Lab 05/18/24 0732 05/18/24 0737 05/18/24 1307 05/18/24 2340 06/06/2024 0208  HGB 9.1*   < > 9.5* 9.9* 8.5*  HCT 30.3*   < > 28.0* 29.0* 27.9*  WBC 9.5  --   --   --  13.0*  PLT 217  --   --   --  175   < > = values in this interval not displayed.     This patient is critically ill with multiple organ system failure which requires frequent high complexity decision making, assessment, support, evaluation, and titration of therapies. This was completed through the application of advanced monitoring technologies and extensive interpretation of multiple databases. During this encounter critical care time was devoted to patient care services described in this note for 45 minutes.  Patrick Mussel, DO 06/01/2024 3:55 PM [facility]  For contact information, see Amion. If no response to pager, please call PCCM consult pager. After hours, 7PM- 7AM, please call Elink.

## 2024-06-11 DEATH — deceased
